# Patient Record
Sex: Female | Born: 1950 | Race: Black or African American | Hispanic: No | Marital: Single | Smoking: Light tobacco smoker
Health system: Southern US, Community
[De-identification: ages and names within clinical notes are randomized; demographics above are authoritative.]

## PROBLEM LIST (undated history)

## (undated) DIAGNOSIS — K219 Gastro-esophageal reflux disease without esophagitis: Secondary | ICD-10-CM

## (undated) DIAGNOSIS — E785 Hyperlipidemia, unspecified: Secondary | ICD-10-CM

## (undated) DIAGNOSIS — F32A Depression, unspecified: Secondary | ICD-10-CM

## (undated) DIAGNOSIS — F329 Major depressive disorder, single episode, unspecified: Secondary | ICD-10-CM

## (undated) DIAGNOSIS — N183 Chronic kidney disease, stage 3 unspecified: Secondary | ICD-10-CM

## (undated) DIAGNOSIS — E669 Obesity, unspecified: Secondary | ICD-10-CM

## (undated) DIAGNOSIS — R51 Headache: Secondary | ICD-10-CM

## (undated) DIAGNOSIS — I739 Peripheral vascular disease, unspecified: Secondary | ICD-10-CM

## (undated) DIAGNOSIS — R519 Headache, unspecified: Secondary | ICD-10-CM

## (undated) DIAGNOSIS — M1990 Unspecified osteoarthritis, unspecified site: Secondary | ICD-10-CM

## (undated) DIAGNOSIS — T7840XA Allergy, unspecified, initial encounter: Secondary | ICD-10-CM

## (undated) DIAGNOSIS — R0602 Shortness of breath: Secondary | ICD-10-CM

## (undated) DIAGNOSIS — G8929 Other chronic pain: Secondary | ICD-10-CM

## (undated) DIAGNOSIS — I639 Cerebral infarction, unspecified: Secondary | ICD-10-CM

## (undated) DIAGNOSIS — I10 Essential (primary) hypertension: Secondary | ICD-10-CM

## (undated) HISTORY — DX: Headache: R51

## (undated) HISTORY — DX: Allergy, unspecified, initial encounter: T78.40XA

## (undated) HISTORY — DX: Hyperlipidemia, unspecified: E78.5

## (undated) HISTORY — DX: Depression, unspecified: F32.A

## (undated) HISTORY — DX: Headache, unspecified: R51.9

## (undated) HISTORY — DX: Shortness of breath: R06.02

## (undated) HISTORY — DX: Other chronic pain: G89.29

## (undated) HISTORY — DX: Essential (primary) hypertension: I10

## (undated) HISTORY — DX: Peripheral vascular disease, unspecified: I73.9

## (undated) HISTORY — DX: Obesity, unspecified: E66.9

## (undated) HISTORY — DX: Unspecified osteoarthritis, unspecified site: M19.90

## (undated) HISTORY — DX: Major depressive disorder, single episode, unspecified: F32.9

---

## 2006-11-24 ENCOUNTER — Ambulatory Visit: Payer: Self-pay | Admitting: Nurse Practitioner

## 2006-11-27 ENCOUNTER — Ambulatory Visit: Payer: Self-pay | Admitting: *Deleted

## 2006-11-30 ENCOUNTER — Ambulatory Visit: Payer: Self-pay | Admitting: Nurse Practitioner

## 2007-07-20 DIAGNOSIS — M129 Arthropathy, unspecified: Secondary | ICD-10-CM | POA: Insufficient documentation

## 2007-07-20 DIAGNOSIS — I10 Essential (primary) hypertension: Secondary | ICD-10-CM | POA: Insufficient documentation

## 2007-07-20 DIAGNOSIS — E785 Hyperlipidemia, unspecified: Secondary | ICD-10-CM | POA: Insufficient documentation

## 2007-08-08 ENCOUNTER — Encounter (INDEPENDENT_AMBULATORY_CARE_PROVIDER_SITE_OTHER): Payer: Self-pay | Admitting: *Deleted

## 2007-12-03 ENCOUNTER — Other Ambulatory Visit: Admission: RE | Admit: 2007-12-03 | Discharge: 2007-12-03 | Payer: Self-pay | Admitting: Family Medicine

## 2009-03-11 ENCOUNTER — Other Ambulatory Visit: Admission: RE | Admit: 2009-03-11 | Discharge: 2009-03-11 | Payer: Self-pay | Admitting: Family Medicine

## 2010-10-11 ENCOUNTER — Encounter: Admission: RE | Admit: 2010-10-11 | Discharge: 2010-10-11 | Payer: Self-pay | Admitting: Family Medicine

## 2010-10-19 ENCOUNTER — Encounter: Admission: RE | Admit: 2010-10-19 | Discharge: 2010-10-19 | Payer: Self-pay | Admitting: Family Medicine

## 2010-11-05 ENCOUNTER — Ambulatory Visit: Payer: Self-pay | Admitting: Vascular Surgery

## 2010-11-25 ENCOUNTER — Encounter: Payer: Self-pay | Admitting: Cardiovascular Disease

## 2010-11-25 ENCOUNTER — Ambulatory Visit (HOSPITAL_COMMUNITY)
Admission: RE | Admit: 2010-11-25 | Discharge: 2010-11-25 | Payer: Self-pay | Source: Home / Self Care | Attending: Vascular Surgery | Admitting: Vascular Surgery

## 2010-11-25 LAB — POCT I-STAT, CHEM 8
BUN: 10 mg/dL (ref 6–23)
Calcium, Ion: 1.06 mmol/L — ABNORMAL LOW (ref 1.12–1.32)
Chloride: 104 mEq/L (ref 96–112)
Creatinine, Ser: 0.8 mg/dL (ref 0.4–1.2)
Glucose, Bld: 182 mg/dL — ABNORMAL HIGH (ref 70–99)
HCT: 41 % (ref 36.0–46.0)
Hemoglobin: 13.9 g/dL (ref 12.0–15.0)
Potassium: 3.4 mEq/L — ABNORMAL LOW (ref 3.5–5.1)
Sodium: 142 mEq/L (ref 135–145)
TCO2: 31 mmol/L (ref 0–100)

## 2010-11-25 LAB — GLUCOSE, CAPILLARY
Glucose-Capillary: 178 mg/dL — ABNORMAL HIGH (ref 70–99)
Glucose-Capillary: 169 mg/dL — ABNORMAL HIGH (ref 70–99)

## 2010-11-26 LAB — POCT I-STAT, CHEM 8
BUN: 16 mg/dL (ref 6–23)
Calcium, Ion: 0.98 mmol/L — ABNORMAL LOW (ref 1.12–1.32)
Chloride: 107 mEq/L (ref 96–112)
Creatinine, Ser: 0.9 mg/dL (ref 0.4–1.2)
Glucose, Bld: 180 mg/dL — ABNORMAL HIGH (ref 70–99)
HCT: 44 % (ref 36.0–46.0)
Hemoglobin: 15 g/dL (ref 12.0–15.0)
Potassium: 6.4 mEq/L (ref 3.5–5.1)
Sodium: 138 mEq/L (ref 135–145)
TCO2: 31 mmol/L (ref 0–100)

## 2010-12-10 ENCOUNTER — Ambulatory Visit: Admit: 2010-12-10 | Payer: Self-pay | Admitting: Vascular Surgery

## 2010-12-10 ENCOUNTER — Ambulatory Visit
Admission: RE | Admit: 2010-12-10 | Discharge: 2010-12-10 | Payer: Self-pay | Source: Home / Self Care | Attending: Vascular Surgery | Admitting: Vascular Surgery

## 2010-12-13 NOTE — Assessment & Plan Note (Addendum)
OFFICE VISIT  Alicia [name], Alicia [name] DOB:  Jun 30, 1951                                       12/10/2010 CHART#:[id number]  This is an established patient followup.  HISTORY OF PRESENT ILLNESS:  I recently took this patient back to the angio suite on 11/25/2010.  I did an aortogram, bilateral leg runoff, attempted left superficial femoral artery recanalization, however, this failed.  Currently the patient presents with left greater than right pain that she describes as consistent with claudication.  She says this influences even her ability to shop at the grocery store.  She has not followed up with her primary care physician and continues to smoke.  She denies any rest pain symptoms or any type of gangrene or ulceration in either feet.  PHYSICAL EXAMINATION:  She had a blood pressure 162/92, heart rate 103, respirations 12, satting 97%.  On physical exam today she has no evidence of any hematoma at the right common femoral artery cannulation site.  Both feet do not have any pulses and there are no findings consistent with gangrene or any type of ulcerations or ischemic changes.  MEDICAL DECISION MAKING:  This is a 60 year old female with multiple uncontrolled medical comorbidities.  She continues to smoke.  I discussed with her the importance of maximal medical management.  She needs to be started on aspirin, she needs to be started on a statin drug, she needs optimization of her blood pressure regimen as today's blood pressure demonstrates poorly controlled hypertension.  She needs to be started on some pharmacologic agents to help her with smoking cessation.  I discussed with her the importance of getting her medical conditions under good control before considering any type of operation and that she minimally is going to need a left common femoral artery to below knee popliteal bypass with vein of which she had a vein mapping today which demonstrates  good vein conduit on both sides in the greater saphenous and also the lesser saphenous.  She is also going to need preoperative risk stratification and optimization so she needs to be referred to cardiology also.  I insisted that she follow up with Dr. Berdine Addison for further optimization of her medical conditions and then evaluation by cardiology before proceeding with any type of bypass.  I will have my office will set up her followup appointments with her primary care doctor and also a cardiologist at Dr. Cathey Endow choice and then I will see her back in 1 month.  If she has at that point made a concerted effort to manage her medical problems and also stop smoking at that point I will offer her this left common femoral artery to below knee pop bypass but at this point she has demonstrated no effort whatsoever to optimize her medical additions and I feel that ultimately this mentality will result in failure as her distal peripheral arterial disease will progress and her tibials will become occluded and subsequently there is no reason to undergo a possibly highly mortal and highly morbid procedure when there is low chance of future long-term patency.  I cited to her an approximately 2% to 5% mortality rate with an approximately 20% to 30% morbidity rate in her case.  She is aware of these risks and she agrees to proceed forward with the plan as we discussed.  I spent approximately 30 minutes in discussing  the nature of the natural history of intermittent claudication and the nature of peripheral arterial disease.    Conrad [place], MD Electronically Signed  BLC/MEDQ  D:  12/10/2010  T:  12/13/2010  Job:  [id number]  cc:   Barrie Folk. Berdine Addison, MD

## 2010-12-17 NOTE — Procedures (Unsigned)
VASCULAR LAB EXAM  INDICATION:  Preoperative vein mapping per record a phone on 11/26/2010.  HISTORY: Diabetes: Cardiac: Hypertension:  EXAM:  Bilateral lower extremity vein mapping.  IMPRESSION: 1. The right greater saphenous vein is compressible with diameter     measurements ranging from 0.28 to 0.47 cm. 2. The left lesser saphenous vein is compressible with diameter     measurements ranging from 0.25 to 0.41 cm. 3. The left greater saphenous vein is compressible with diameter     measurements ranging from 0.32 to 0.66 cm. 4. The left lesser saphenous vein is compressible with diameter     measurements ranging from 0.22 to 0.33 cm.  See attached worksheet for additional measurements and diagram.  ___________________________________________ Conrad [place], MD  CH/MEDQ  D:  12/10/2010  T:  12/10/2010  Job:  [id number]

## 2011-01-03 ENCOUNTER — Encounter: Payer: Self-pay | Admitting: Cardiovascular Disease

## 2011-01-03 ENCOUNTER — Encounter (INDEPENDENT_AMBULATORY_CARE_PROVIDER_SITE_OTHER): Payer: Medicare PPO | Admitting: Cardiovascular Disease

## 2011-01-03 DIAGNOSIS — Z01810 Encounter for preprocedural cardiovascular examination: Secondary | ICD-10-CM

## 2011-01-03 DIAGNOSIS — I10 Essential (primary) hypertension: Secondary | ICD-10-CM

## 2011-01-03 DIAGNOSIS — R079 Chest pain, unspecified: Secondary | ICD-10-CM | POA: Insufficient documentation

## 2011-01-05 ENCOUNTER — Telehealth (INDEPENDENT_AMBULATORY_CARE_PROVIDER_SITE_OTHER): Payer: Self-pay | Admitting: Radiology

## 2011-01-06 ENCOUNTER — Encounter (INDEPENDENT_AMBULATORY_CARE_PROVIDER_SITE_OTHER): Payer: Self-pay | Admitting: *Deleted

## 2011-01-06 ENCOUNTER — Encounter: Payer: Self-pay | Admitting: *Deleted

## 2011-01-06 ENCOUNTER — Ambulatory Visit (HOSPITAL_COMMUNITY): Payer: Medicare PPO

## 2011-01-06 ENCOUNTER — Encounter: Payer: Self-pay | Admitting: Cardiology

## 2011-01-06 DIAGNOSIS — I4949 Other premature depolarization: Secondary | ICD-10-CM

## 2011-01-06 DIAGNOSIS — R079 Chest pain, unspecified: Secondary | ICD-10-CM

## 2011-01-06 DIAGNOSIS — R0602 Shortness of breath: Secondary | ICD-10-CM

## 2011-01-11 ENCOUNTER — Ambulatory Visit (HOSPITAL_COMMUNITY): Payer: Medicare PPO | Attending: Cardiovascular Disease

## 2011-01-11 DIAGNOSIS — E119 Type 2 diabetes mellitus without complications: Secondary | ICD-10-CM | POA: Insufficient documentation

## 2011-01-11 DIAGNOSIS — R0989 Other specified symptoms and signs involving the circulatory and respiratory systems: Secondary | ICD-10-CM

## 2011-01-11 DIAGNOSIS — R072 Precordial pain: Secondary | ICD-10-CM | POA: Insufficient documentation

## 2011-01-11 DIAGNOSIS — I10 Essential (primary) hypertension: Secondary | ICD-10-CM | POA: Insufficient documentation

## 2011-01-11 DIAGNOSIS — Z01810 Encounter for preprocedural cardiovascular examination: Secondary | ICD-10-CM

## 2011-01-11 DIAGNOSIS — R079 Chest pain, unspecified: Secondary | ICD-10-CM | POA: Insufficient documentation

## 2011-01-11 DIAGNOSIS — I379 Nonrheumatic pulmonary valve disorder, unspecified: Secondary | ICD-10-CM | POA: Insufficient documentation

## 2011-01-11 DIAGNOSIS — I079 Rheumatic tricuspid valve disease, unspecified: Secondary | ICD-10-CM | POA: Insufficient documentation

## 2011-01-11 DIAGNOSIS — E785 Hyperlipidemia, unspecified: Secondary | ICD-10-CM | POA: Insufficient documentation

## 2011-01-12 ENCOUNTER — Encounter: Payer: Self-pay | Admitting: Cardiovascular Disease

## 2011-01-12 NOTE — Assessment & Plan Note (Signed)
Summary: sur clearence/ per annette pt has [facility] choice...pt hav...  Medications Added NAPROXEN 500 MG TABS (NAPROXEN) 1 tab by mouth two times a day KLOR-CON 10 10 MEQ CR-TABS (POTASSIUM CHLORIDE) 1 tab by mouth two times a day METFORMIN HCL 1000 MG TABS (METFORMIN HCL) 1  tab by mouth two times a day FAMOTIDINE 20 MG TABS (FAMOTIDINE) 1 tab by mouth once daily VASOTEC 20 MG TABS (ENALAPRIL MALEATE) 1 tab by mouth once daily ADALAT CC 60 MG XR24H-TAB (NIFEDIPINE) 1 tab by mouth two times a day CRESTOR 5 MG TABS (ROSUVASTATIN CALCIUM) 1 tab by mouth once daily ASPIRIN 81 MG  TABS (ASPIRIN) 1  tab by mouth once daily        Visit Type:  Initial Consult Primary Provider:  Dr. Berdine Addison ([facility])  CC:  chest pain.  History of Present Illness: 60 yo AAF with history of obesity, DM, HTN, hyperlipidemia, ongoing tobacco abuse who is here today for cardiac evaluation prior to planned left lower extremity bypass surgery. She has been recently seen by Dr. Bridgett Larsson and had a lower extremity angiogram demonstrating bilateral occlusions of the SFA. Attempted PTA unsuccessful left SFA. Plans currently include femoral popliteal bypass of the left lower extremity. She is here today for pre-operative cardiac risk assessment.   She tells me that she has non-exertional chest pains several times per week. This is not a severe pain. No radiation into her neck/jaw or shoulder. Her breathing is always abnormal. No changes. Her legs ache with walking. The left leg hurts after walking for 20 feet. Bilateral knee pain secondary to arthritis. She smokes 1ppd.   Current Medications (verified): 1)  Naproxen 500 Mg Tabs (Naproxen) .Marland Kitchen.. 1 Tab By Mouth Two Times A Day 2)  Klor-Con 10 10 Meq Cr-Tabs (Potassium Chloride) .Marland Kitchen.. 1 Tab By Mouth Two Times A Day 3)  Metformin Hcl 1000 Mg Tabs (Metformin Hcl) .Marland Kitchen.. 1  Tab By Mouth Two Times A Day 4)  Famotidine 20 Mg Tabs (Famotidine) .Marland Kitchen.. 1 Tab By Mouth Once Daily 5)   Vasotec 20 Mg Tabs (Enalapril Maleate) .Marland Kitchen.. 1 Tab By Mouth Once Daily 6)  Adalat Cc 60 Mg Xr24h-Tab (Nifedipine) .Marland Kitchen.. 1 Tab By Mouth Two Times A Day 7)  Crestor 5 Mg Tabs (Rosuvastatin Calcium) .Marland Kitchen.. 1 Tab By Mouth Once Daily 8)  Aspirin 81 Mg  Tabs (Aspirin) .Marland Kitchen.. 1  Tab By Mouth Once Daily  Allergies: No Known Drug Allergies  Past History:  Past Medical History: 1.  DM 2.  HTN 3.  Hyperlipidemia 4. Arthritis 5. PAD 6. Tobacco abuse-x 40 years, 1ppd  Past Surgical History: None  Family History: Reviewed history from 12/31/2010 and no changes required.  Mother-deceased age 105, ? cause. Father-deceased, age 60, DM. 3 brothers-alive 6 sisters-alive  Social History: Reviewed history from 12/31/2010 and no changes required. 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  The patient denies fatigue, malaise, fever, weight gain/loss, vision loss, decreased hearing, hoarseness, palpitations, prolonged cough, wheezing, sleep apnea, coughing up blood, abdominal pain, blood in stool, nausea, vomiting, diarrhea, heartburn, incontinence, blood in urine, muscle weakness, joint pain, leg swelling, rash, skin lesions, headache, fainting, dizziness, depression, anxiety, enlarged lymph nodes, easy bruising or bleeding, and environmental allergies.         Bilateral leg pain with ambulation.  Vital Signs:  Patient profile:   60 year old female Height:      67 inches Weight:      275 pounds BMI:     43.23 Pulse rate:   75 / minute Resp:     14 per minute BP sitting:   160 / 100  (left arm)  Vitals Entered By: Burnett Kanaris (January 03, 2011 10:21 AM)   Physical Exam  General:  General: Well developed, well nourished, NAD HEENT: OP clear, mucus membranes moist SKIN: warm, dry Neuro: No focal  deficits Musculoskeletal: Muscle strength 5/5 all ext Psychiatric: Mood and affect normal Neck: No JVD, no carotid bruits, no thyromegaly, no lymphadenopathy. Lungs:Clear bilaterally, no wheezes, rhonci, crackles CV: RRR with systolic  murmur. No  gallops rubs Abdomen: soft, NT, ND, BS present Extremities: No edema, pulses non-palpable bilateral DP/PT.    EKG  Procedure date:  11/25/2010  Findings:      NSR, rate 86 bpm. LVH. PACs.   Impression & Recommendations:  Problem # 1:  PRE-OPERATIVE CARDIAC EXAM (ICD-V72.81) She has multiple risk factors for CAD including obesity, sedentary lifestyle, tobacco abuse, DM, HTN and hyperlipidemia. She has no prior history of CAD but she is having occasional chest pains. Will arrange Lexiscan myoview to exclude ischemia. Given murmur and LVH on ekg, will check echo. Will call her with results. If abnormal, will arrange follow up appt.   Her updated medication list for this problem includes:    Vasotec 20 Mg Tabs (Enalapril maleate) .Marland Kitchen... 1 tab by mouth once daily    Adalat Cc 60 Mg Xr24h-tab (Nifedipine) .Marland Kitchen... 1 tab by mouth two times a day    Aspirin 81 Mg Tabs (Aspirin) .Marland Kitchen... 1  tab by mouth once daily  Problem # 2:  CHEST PAIN (ICD-786.50) See above.   Her updated medication list for this problem includes:    Vasotec 20 Mg Tabs (Enalapril maleate) .Marland Kitchen... 1 tab by mouth once daily    Adalat Cc 60 Mg Xr24h-tab (Nifedipine) .Marland Kitchen... 1 tab by mouth two times a day    Aspirin 81 Mg Tabs (Aspirin) .Marland Kitchen... 1  tab by mouth once daily  Orders: Nuclear Stress Test (Nuc Stress Test) Echocardiogram (Echo)  Problem # 3:  HYPERTENSION (ICD-401.9) Poor control. Will need to be followed and have meds adjusted as necessary.   Her updated medication list for this problem includes:    Vasotec 20 Mg Tabs (Enalapril maleate) .Marland Kitchen... 1 tab by mouth once daily    Adalat Cc 60 Mg Xr24h-tab (Nifedipine) .Marland Kitchen... 1 tab by mouth two times a day    Aspirin 81 Mg Tabs  (Aspirin) .Marland Kitchen... 1  tab by mouth once daily  Patient Instructions: 1)  Your physician recommends that you schedule a follow-up appointment as needed. We will call you with the results. 2)  Your physician recommends that you continue on your current medications as directed. Please refer to the Current Medication list given to you today. 3)  Your physician has requested that you have an echocardiogram.  Echocardiography is a painless test that uses sound waves to create images of your heart. It provides your doctor with information about the size and shape of your heart and how well your heart's chambers and valves are working.  This procedure takes approximately one hour. There are no restrictions for this procedure. 4)  Your physician has requested that you have an Bettles.  For further information please visit [email or web address].  Please follow instruction sheet, as  given.

## 2011-01-12 NOTE — Progress Notes (Signed)
Summary: Nuclear Pre-Procedure  Phone Note Outgoing Call Call back at [facility] Phone [phone]   Call placed by: Eliezer Lofts, EMT-P,  January 05, 2011 11:28 AM Call placed to: Patient Action Taken: Phone Call Completed Reason for Call: Confirm/change Appt Summary of Call: Reviewed information on Myoview Information Sheet (see scanned document for further details).  Spoke with the patient. Eliezer Lofts, EMT-P  January 05, 2011 11:28 AM      Nuclear Med Background Indications for Stress Test: Evaluation for Ischemia, Surgical Clearance  Indications Comments: Pending LLE bypass   History Comments: AAF  Symptoms: Chest Pain, SOB    Nuclear Pre-Procedure Cardiac Risk Factors: Hypertension, Lipids, NIDDM, Obesity, PVD, Smoker Height (in): 67

## 2011-01-18 NOTE — Letter (Signed)
Summary: Generic Letter  Press photographer, [place]  [address] [place]   [place], [place] [address]   Phone: [phone]  Fax: [phone]        January 12, 2011 MRN: [id number]    [address] [place], [place]  [address]    Dr. Bridgett Larsson,   I had the pleasure of seeing Josephina Hattar in my office for cardiac evaluation prior to her planned surgical procedure. Her stress test showed no ischemia. Her echocardiogram demonstrated low normal LV systolic function with no significant valvular abnormalities. I would not recommend further cardiac workup at this time. I will fax the stress test report and the echocardiogram report to your office. Thank you for including me in her care.      Sincerely,  Lauree Chandler, MD  This letter has been electronically signed by your physician.  Appended Document: Generic Letter letter faxed to Dr Bridgett Larsson

## 2011-01-18 NOTE — Assessment & Plan Note (Signed)
Summary: Cardiology Nuclear Testing  Nuclear Med Background Indications for Stress Test: Evaluation for Ischemia, Surgical Clearance  Indications Comments: Pending LLE bypass   History Comments: AAF  Symptoms: Chest Pain, DOE, Fatigue, Palpitations, SOB    Nuclear Pre-Procedure Cardiac Risk Factors: Hypertension, Lipids, NIDDM, Obesity, PVD, Smoker Caffeine/Decaff Intake: none NPO After: 7:00 PM Lungs: clear IV 0.9% NS with Angio Cath: 20g     IV Site: R Antecubital IV Started by: Perrin Maltese, EMT-P Chest Size (in) 42     Cup Size DD     Height (in): 67 Weight (lb): 272 BMI: 42.76  Nuclear Med Study 1 or 2 day study:  2 day     Stress Test Type:  Lexiscan Reading MD:  Darlin Coco, MD     Referring MD:  C.McAlhany Resting Radionuclide:  Technetium 25m Tetrofosmin     Resting Radionuclide Dose:  33 mCi  Stress Radionuclide:  Technetium 60m Tetrofosmin     Stress Radionuclide Dose:  33 mCi   Stress Protocol  Max Systolic BP: 0000000 mm Hg Lexiscan: 0.4 mg   Stress Test Technologist:  Perrin Maltese, EMT-P     Nuclear Technologist:  Annye Rusk, CNMT  Rest Procedure  Myocardial perfusion imaging was performed at rest 45 minutes following the intravenous administration of Technetium 45m Tetrofosmin.  Stress Procedure  The patient received IV Lexiscan 0.4 mg over 15-seconds.  Technetium 75m Tetrofosmin injected at 30-seconds.  There were no significant changes, + sob, and occ pvcs with infusion.  Quantitative spect images were obtained after a 45 minute delay.  QPS Raw Data Images:  Normal; no motion artifact; normal heart/lung ratio. Stress Images:  Normal homogeneous uptake in all areas of the myocardium. Rest Images:  Normal homogeneous uptake in all areas of the myocardium. Subtraction (SDS):  No evidence of ischemia. Transient Ischemic Dilatation:  1.37  (Normal <1.22)  Lung/Heart Ratio:  0.27  (Normal <0.45)  Quantitative Gated Spect Images QGS EDV:  118  ml QGS ESV:  61 ml QGS EF:  48 % QGS cine images:  No segmental wall motion abnormalities.  Findings Low risk nuclear study   Evidence for LV Dysfunction LV Dysfunction    Overall Impression  Exercise Capacity: Lexiscan with no exercise. BP Response: Normal blood pressure response. Clinical Symptoms: Dyspnea.  No chest pain. ECG Impression: No significant ST segment change suggestive of ischemia. Overall Impression: Normal stress nuclear study. Overall Impression Comments: Borderline low LV EF.  No perfusion abnormalities seen.  Appended Document: Cardiology Nuclear Testing No evidence of ischemia. Can we let her know that the stress test is ok and she can have her surgery as planned? thanks, chris  Appended Document: Cardiology Nuclear Testing pt aware  resukts faxed to Dr Bridgett Larsson

## 2011-01-21 ENCOUNTER — Ambulatory Visit (INDEPENDENT_AMBULATORY_CARE_PROVIDER_SITE_OTHER): Payer: Medicare PPO | Admitting: Vascular Surgery

## 2011-01-21 DIAGNOSIS — I70219 Atherosclerosis of native arteries of extremities with intermittent claudication, unspecified extremity: Secondary | ICD-10-CM

## 2011-01-24 NOTE — Assessment & Plan Note (Signed)
OFFICE VISIT  Alicia [name], Alicia [name] DOB:  [birth year]                                       01/21/2011 CHART#:[id number]  This is an established patient.  HISTORY OF PRESENT ILLNESS:  This is a 60 year old patient with bilateral claudication that is lifestyle limiting and is influencing her ability to maintain a job.  Her left side is significantly more symptomatic than the right.  I had sent her back to Dr. Berdine Addison and a cardiologist for evaluation prior to proceeding forward with left common femoral artery to below knee popliteal bypass with vein from her left leg.  At this point the patient notes she has been decreasing her cigarette use and has been exercising with about a 1-2 pound weight loss monthly.  Additionally, she has been cleared by her cardiologist at this point.  Her symptomatology is unchanged from previous.  PHYSICAL EXAMINATION:  Her blood pressure 174/99, heart rate of 103, respirations 12, satting 97% on room air.  On focused exam bilateral feet had no palpable pulses.  There were no ulcers or any type of ischemic changes in either feet at this point.  MEDICAL DECISION MAKING:  This is a 60 year old female that presents with lifestyle limiting claudication.  We have had a long extended discussion about 30 minutes in regards to the risks, benefits and alternatives.  She is aware that she is at risk for possible death, bleeding, infection, possible wound complications, possible nerve damage, possible need for additional procedures.  I stressed with her previously about a 2% mortality rate and about a 30% morbidity rate especially with her obesity and need for vein harvest.  She is aware of the risks.  She is going to continue with optimizing her lifestyle, reducing her cigarette use and tentatively will schedule her per the patient's wishes on 02/23/2011.    Conrad [place], MD Electronically Signed  BLC/MEDQ  D:  01/21/2011  T:   01/24/2011  Job:  [id number]

## 2011-02-17 ENCOUNTER — Encounter (HOSPITAL_COMMUNITY)
Admission: RE | Admit: 2011-02-17 | Discharge: 2011-02-17 | Disposition: A | Discharge status: Home / Self Care | Payer: Medicare PPO | Source: Ambulatory Visit | Attending: Vascular Surgery | Admitting: Vascular Surgery

## 2011-02-17 ENCOUNTER — Other Ambulatory Visit: Payer: Self-pay | Admitting: Vascular Surgery

## 2011-02-17 ENCOUNTER — Ambulatory Visit (HOSPITAL_COMMUNITY)
Admission: RE | Admit: 2011-02-17 | Discharge: 2011-02-17 | Disposition: A | Discharge status: Home / Self Care | Payer: Medicare PPO | Source: Ambulatory Visit | Attending: Vascular Surgery | Admitting: Vascular Surgery

## 2011-02-17 DIAGNOSIS — E119 Type 2 diabetes mellitus without complications: Secondary | ICD-10-CM | POA: Insufficient documentation

## 2011-02-17 DIAGNOSIS — I739 Peripheral vascular disease, unspecified: Secondary | ICD-10-CM

## 2011-02-17 DIAGNOSIS — Z01812 Encounter for preprocedural laboratory examination: Secondary | ICD-10-CM | POA: Insufficient documentation

## 2011-02-17 DIAGNOSIS — R0602 Shortness of breath: Secondary | ICD-10-CM | POA: Insufficient documentation

## 2011-02-17 DIAGNOSIS — I10 Essential (primary) hypertension: Secondary | ICD-10-CM | POA: Insufficient documentation

## 2011-02-17 DIAGNOSIS — F17200 Nicotine dependence, unspecified, uncomplicated: Secondary | ICD-10-CM | POA: Insufficient documentation

## 2011-02-17 DIAGNOSIS — Z01818 Encounter for other preprocedural examination: Secondary | ICD-10-CM | POA: Insufficient documentation

## 2011-02-17 DIAGNOSIS — Z01811 Encounter for preprocedural respiratory examination: Secondary | ICD-10-CM | POA: Insufficient documentation

## 2011-02-17 LAB — CBC
HCT: 44.2 % (ref 36.0–46.0)
Hemoglobin: 14.8 g/dL (ref 12.0–15.0)
MCH: 29.8 pg (ref 26.0–34.0)
MCHC: 33.5 g/dL (ref 30.0–36.0)
MCV: 88.9 fL (ref 78.0–100.0)
Platelets: 282 10*3/uL (ref 150–400)
RBC: 4.97 MIL/uL (ref 3.87–5.11)
RDW: 14 % (ref 11.5–15.5)
WBC: 8.2 10*3/uL (ref 4.0–10.5)

## 2011-02-17 LAB — COMPREHENSIVE METABOLIC PANEL
ALT: 15 U/L (ref 0–35)
AST: 14 U/L (ref 0–37)
Albumin: 3.6 g/dL (ref 3.5–5.2)
Alkaline Phosphatase: 105 U/L (ref 39–117)
BUN: 10 mg/dL (ref 6–23)
CO2: 26 mEq/L (ref 19–32)
Calcium: 9.3 mg/dL (ref 8.4–10.5)
Chloride: 102 mEq/L (ref 96–112)
Creatinine, Ser: 0.78 mg/dL (ref 0.4–1.2)
GFR calc Af Amer: >60 mL/min (ref >60)
GFR calc non Af Amer: >60 mL/min (ref >60)
Glucose, Bld: 169 mg/dL — ABNORMAL HIGH (ref 70–99)
Potassium: 4.1 mEq/L (ref 3.5–5.1)
Sodium: 139 mEq/L (ref 135–145)
Total Bilirubin: 0.5 mg/dL (ref 0.3–1.2)
Total Protein: 7.2 g/dL (ref 6.0–8.3)

## 2011-02-17 LAB — ABO/RH: ABO/RH(D): O POS

## 2011-02-17 LAB — URINALYSIS, ROUTINE W REFLEX MICROSCOPIC
Bilirubin Urine: NEGATIVE
Glucose, UA: NEGATIVE mg/dL
Ketones, ur: NEGATIVE mg/dL
Nitrite: NEGATIVE
Protein, ur: NEGATIVE mg/dL
Specific Gravity, Urine: 1.015 (ref 1.005–1.030)
Urobilinogen, UA: 1 mg/dL (ref 0.0–1.0)
pH: 7.5 (ref 5.0–8.0)

## 2011-02-17 LAB — APTT: aPTT: 26 seconds (ref 24–37)

## 2011-02-17 LAB — URINE MICROSCOPIC-ADD ON

## 2011-02-17 LAB — PROTIME-INR
INR: 0.89 (ref 0.00–1.49)
Prothrombin Time: 12.2 seconds (ref 11.6–15.2)

## 2011-02-17 LAB — SURGICAL PCR SCREEN
MRSA, PCR: NEGATIVE
Staphylococcus aureus: NEGATIVE

## 2011-02-18 ENCOUNTER — Telehealth: Payer: Self-pay | Admitting: Cardiology

## 2011-02-18 NOTE — Telephone Encounter (Signed)
Stress faxed to [facility] @ [id number] 02/18/11/KM

## 2011-02-23 ENCOUNTER — Inpatient Hospital Stay (HOSPITAL_COMMUNITY)
Admission: RE | Admit: 2011-02-23 | Discharge: 2011-02-28 | DRG: 253 | Disposition: A | Discharge status: Home Health | Payer: Medicare PPO | Source: Ambulatory Visit | Attending: Vascular Surgery | Admitting: Vascular Surgery

## 2011-02-23 DIAGNOSIS — B952 Enterococcus as the cause of diseases classified elsewhere: Secondary | ICD-10-CM | POA: Diagnosis not present

## 2011-02-23 DIAGNOSIS — I70219 Atherosclerosis of native arteries of extremities with intermittent claudication, unspecified extremity: Principal | ICD-10-CM | POA: Diagnosis present

## 2011-02-23 DIAGNOSIS — N390 Urinary tract infection, site not specified: Secondary | ICD-10-CM | POA: Diagnosis not present

## 2011-02-23 DIAGNOSIS — E119 Type 2 diabetes mellitus without complications: Secondary | ICD-10-CM | POA: Diagnosis present

## 2011-02-23 DIAGNOSIS — Z7982 Long term (current) use of aspirin: Secondary | ICD-10-CM

## 2011-02-23 DIAGNOSIS — E785 Hyperlipidemia, unspecified: Secondary | ICD-10-CM | POA: Diagnosis present

## 2011-02-23 DIAGNOSIS — F17200 Nicotine dependence, unspecified, uncomplicated: Secondary | ICD-10-CM | POA: Diagnosis present

## 2011-02-23 DIAGNOSIS — E669 Obesity, unspecified: Secondary | ICD-10-CM | POA: Diagnosis present

## 2011-02-23 DIAGNOSIS — I7092 Chronic total occlusion of artery of the extremities: Secondary | ICD-10-CM | POA: Diagnosis present

## 2011-02-23 DIAGNOSIS — Z79899 Other long term (current) drug therapy: Secondary | ICD-10-CM

## 2011-02-23 DIAGNOSIS — I10 Essential (primary) hypertension: Secondary | ICD-10-CM | POA: Diagnosis present

## 2011-02-23 HISTORY — PX: FEMORAL BYPASS: SHX50

## 2011-02-23 LAB — GLUCOSE, CAPILLARY
Glucose-Capillary: 201 mg/dL — ABNORMAL HIGH (ref 70–99)
Glucose-Capillary: 148 mg/dL — ABNORMAL HIGH (ref 70–99)
Glucose-Capillary: 155 mg/dL — ABNORMAL HIGH (ref 70–99)
Glucose-Capillary: 162 mg/dL — ABNORMAL HIGH (ref 70–99)

## 2011-02-24 LAB — TYPE AND SCREEN
ABO/RH(D): O POS
Antibody Screen: NEGATIVE
Unit division: 0
Unit division: 0

## 2011-02-24 LAB — CBC
HCT: 33.8 % — ABNORMAL LOW (ref 36.0–46.0)
MCHC: 32 g/dL (ref 30.0–36.0)
MCV: 89.9 fL (ref 78.0–100.0)
Platelets: 211 10*3/uL (ref 150–400)
RDW: 14.2 % (ref 11.5–15.5)

## 2011-02-24 LAB — BASIC METABOLIC PANEL
BUN: 6 mg/dL (ref 6–23)
Calcium: 7.7 mg/dL — ABNORMAL LOW (ref 8.4–10.5)
GFR calc non Af Amer: >60 mL/min (ref >60)
Glucose, Bld: 173 mg/dL — ABNORMAL HIGH (ref 70–99)

## 2011-02-25 DIAGNOSIS — I739 Peripheral vascular disease, unspecified: Secondary | ICD-10-CM

## 2011-02-25 DIAGNOSIS — Z48812 Encounter for surgical aftercare following surgery on the circulatory system: Secondary | ICD-10-CM

## 2011-02-25 LAB — GLUCOSE, CAPILLARY
Glucose-Capillary: 160 mg/dL — ABNORMAL HIGH (ref 70–99)
Glucose-Capillary: 188 mg/dL — ABNORMAL HIGH (ref 70–99)
Glucose-Capillary: 195 mg/dL — ABNORMAL HIGH (ref 70–99)
Glucose-Capillary: 210 mg/dL — ABNORMAL HIGH (ref 70–99)

## 2011-02-25 LAB — HEMOGLOBIN A1C
Hgb A1c MFr Bld: 7.8 % — ABNORMAL HIGH (ref <5.7)
Mean Plasma Glucose: 177 mg/dL — ABNORMAL HIGH (ref <117)

## 2011-02-25 LAB — URINE MICROSCOPIC-ADD ON

## 2011-02-25 LAB — URINALYSIS, ROUTINE W REFLEX MICROSCOPIC
Bilirubin Urine: NEGATIVE
Hgb urine dipstick: NEGATIVE
Specific Gravity, Urine: 1.023 (ref 1.005–1.030)
Urobilinogen, UA: 1 mg/dL (ref 0.0–1.0)

## 2011-02-25 LAB — BASIC METABOLIC PANEL
BUN: 8 mg/dL (ref 6–23)
Calcium: 8.4 mg/dL (ref 8.4–10.5)
Creatinine, Ser: 0.79 mg/dL (ref 0.4–1.2)
GFR calc non Af Amer: >60 mL/min (ref >60)
Glucose, Bld: 169 mg/dL — ABNORMAL HIGH (ref 70–99)

## 2011-02-25 LAB — CBC
HCT: 35.3 % — ABNORMAL LOW (ref 36.0–46.0)
MCH: 29.5 pg (ref 26.0–34.0)
MCHC: 32.6 g/dL (ref 30.0–36.0)
MCV: 90.5 fL (ref 78.0–100.0)
RDW: 14.3 % (ref 11.5–15.5)

## 2011-02-25 NOTE — Op Note (Signed)
NAME:  JENIA, ANZALDUA NO.:  [id number]  MEDICAL RECORD NO.:  [id number]           PATIENT TYPE:  I  LOCATION:  3302                         FACILITY:  [facility]  PHYSICIAN:  Conrad Quesada, MD       DATE OF BIRTH:  07-May-1951  DATE OF PROCEDURE:  02/23/2011 DATE OF DISCHARGE:                              OPERATIVE REPORT   PROCEDURE:  Left common femoral artery to below-knee popliteal bypass with ipsilateral nonreverse greater saphenous vein.  PREOPERATIVE DIAGNOSIS:  Lifestyle limiting claudication, bilateral lower extremity, left greater than right.  POSTOPERATIVE DIAGNOSIS:  Lifestyle limiting claudication, bilateral lower extremity, left greater than right.  SURGEON:  Conrad Grottoes, MD  ASSISTANT:  Felipa Furnace, PA-C  ANESTHESIA:  General.  ESTIMATED BLOOD LOSS:  500 mL.  FLUIDS:  About 5 liters of crystalloid.  URINE OUTPUT:  About 300 mL.  FINDINGS: a palpable posterior tibial pulse, a dopplerable peroneal and  dorsalis pedis signals.  SPECIMENS: none.  INDICATIONS:  This is a 60 year old female with poorly controlled comorbidities including diabetes, hypertension, and hyperlipidemia, who I had seen in clinic, at that point she presented with intermittent claudication that was limiting her ability to work.  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  I then brought her back to clinic  extensive counseling in regards to the importance of her quitting her 2-pack a day smoking and control of her multiple comorbidities of which she had no control of uncontrolled hypertensive sugars elevated in the 3-400 and an known hyperlipidemia not being adequately controlled.  She was sent to her primary care physician to  optimize her underlying medical problems and also to cardiologist for risk stratification.  Based on his evaluation she was acceptable risk to proceed forward.  We discussed a plan to do a femoral to below-knee popliteal bypass with her own vein, however, I emphasized to her the importance of maximal medical management.  She agrees to such and to quit smoking.  I discussed specifically with her a possibly 2-5% mortality rate with this procedure given her multiple comorbidities and given her obesity I quoted her around the A999333 complication rate most likely wound infection.  She is aware such and has agreed to proceed forward with such.  DESCRIPTION OF THE OPERATION:  After full informed written consent was obtained from the patient, she was brought back to the operating room and placed supine upon the operating table.  Prior to induction, she had received IV antibiotics.  She was then prepped and draped in a standard fashion for a left leg bypass procedure.  I turned my attention first to her groin.  I made a longitudinal incision over the common femoral artery.  After dissecting through at least 3-4 cm of adipose tissue, I eventually got to her femoral sheath.  This was opened sharply and I was able to find the profunda artery and also  the external  iliac artery underneath the inguinal ligament.  I was able to get control  of the profunda artery, the superficial femoral artery and then the distal external iliac artery.  There was some palpable plaque within the artery, but I did not feel an endarterectomy was going to be necessary in this case.  I then turned my attention to her proximal calf.  I had previously marked out the location of the saphenous vein here and made an incision at this location and taking care to avoid injuring the saphenous vein, I was able to gain access to the popliteal fossa after dissecting through the subcutaneous tissue and fascia with electrocautery and blunt  dissection. There was a large popliteal vein anteriorly, this was dissected anteriorly to visualize the popliteal artery.  It was adjacent to a smaller popliteal vein posteriorly, I was able to get control of this artery proximally and distally with vessel loops.  At this point, then I turned my attention to the saphenous vein.  Dissecting from the saphenofemoral junction down, we made skip incisions in the leg and dissected out the entirety of this vein conduit.  We ligated the large side branches and then clipped the residual sides of these veins, in such fashion I was able to mobilize the vein from the saphenofemoral junction all the way down to the mid calf.  Using umbilical tape, I measured out the distance needed and we had an adequate vein conduit at this point.  I then clamped the saphenous vein distally and transected the vein and then tied off the saphenous vein distally with a 2-0 silk tie. I then pulled the vein all the way up to the saphenofemoral junction.  I had enough vein at this point to ligate the saphenous vein just distal to what appeared to be possibly a duplicated saphenous system.  This was clamped with a baby gregory clamp and then I transected the saphenous vein leaving a little cuff.  The cuff was oversewn with a 5-0 Prolene in a running fashion in a double layer.  I released the clamp and there was no more active bleeding from the saphenofemoral junction.  At this point, the vein was interrogated with by pressurizing it and we tied off any visible side branches and then repaired a few transected branches with a 7-0 Prolene.  At this point, the vein was felt to be adequate diameter throughout, and due to the geometry of it, I felt that a nonreversed orientation would be optimal in this patient, so we soaked a vein in the heparinized saline papaverine mixture and then turned our attention to placement of a metal tunneler.  I dissected from the popliteal fossa  up to the groin using a long Gore tunneler.  I took care to route this metal tunneler between the 2 condyles and then made an attempt to leave the metal tunnel on top of the route of the superficial femoral artery, so that this vein conduit was routed in an anatomically in-line flow.  At this point, the patient was given 10,000 units of heparin intravenously which was the appropriate therapeutic bolus for her weight at 120 kg. After waiting 3 minutes, then I clamped off the profunda, superficial femoral artery and the external iliac artery, and I made an arteriotomy with an 11-blade and extended it with Potts scissor.  There was a medial plaque that was present that did not occupy more than about a third of the lumen, so I did not feel  this required endarterectomy.  I tested the inflow, allowing the external iliac to bleed in antegrade fashion.  There was good pulsatile bleeding from the external iliac artery.  Also, I allowed the profunda to back bleed, there was good backbleeding, and also even the superficial femoral artery back bled.  At this point, I obtained the vein that has been soaking in the heparinized saline papaverine mix and I fashioned the proximal end of this vein in a hood for this anastomosis, I sewed the vein to the hood of the common femoral artery in an end-to-side configuration with a 5-0 Prolene.  I took special care to tack down the plaque circumferentially to prevent lifting of any dissection flap. This was completed without any difficulties.  I then released the clamps and vessel loops.  This pressurized the proximal segment of this vein conduit.   The valves at this point prevented any antegrade flow.  I obtained a Mills valvulotome  and then using this I lysed all of the valves that were obstructing flow.  This  was completed along the entire length of this vein conduit.  There was good pulsatile bleeding from the graft.  I marked the anterior surface of this  vein to maintain orientation.  At this point, I had completely lysed all of the obvious valves and there was good pulsatile bleeding out the end of this artery.  At this point, I took the bullet off the metal tunneler revealing the inner cannula.  The end of this vein was then tied to the inner cannula and then we delivered the vein conduit taking care to maintain orientation of this vein through the metal tunnel I repulled the metal tunnel down and then I transected a securing stitch to the vein conduit.  At this point, I released the clamp on the bypass graft and there was good pulsatile bleeding evident at the distal end of this graft.  I then reclamped it, instilled heparinized saline, flushed out the bypass conduits.  At this point, I turned my attention to the popliteal fossa, where the exposure was reset.  I had control of the popliteal artery proximally and distally with vessel loops.  I then clamped the artery proximally and distally, I removed the vessel loops and then made an arteriotomy with an 11-blade, and extended it with Potts scissor.  It was evident to me that in fact there was a substantial amount of atherosclerosis at this distal artery, but there was still a greater than 50% lumen in the artery.  This arteriotomy was about a 5-mm arteriotomy.  The vein at this level was also approximately 4 mm.  I spatulated this vein slightly to meet the dimensions of this arteriotomy and I had to cut the vein to appropriate length after straightening out the leg.  This vein was anastomosed to this popliteal artery with a running stitch of 6-0 Prolene.  Prior to completing this anastomosis, I allowed the bypass conduit to pressurize in antegrade fashion.  There was good antegrade bleeding and also allowed the popliteal artery to bleed both in antegrade and retrograde fashion.  There was actually good backbleeding from both directions.  I completed anastomosis in the usual fashion and  then I released all clamps.  I placed some thrombin and Gelfoam in all wounds and then after releasing the clamp on the bypass graft immediately there was a pulse in the distal popliteal artery.  We tested the foot, there was a dopplerable posterior tibial, anterior tibial and  dorsalis pedis signals that were much stronger than prior to the bypass.  Additionally, by the end of the case there was a palpable posterior pulse.  At this point, we irrigated out all wounds, looking for bleeding points in the groin.  A single repair stitch was necessary at the toe of the arterial anastomosis, otherwise, there was no more active bleeding at this location.  We looked over all the vein harvest incisions.  There were some areas that required electrocautery.  I gave her 15 mg of protamine initially to start the reversal of heparin and eventually I gave an additional 15 for total of 30 mg of protamine to reverse the heparinization.  By the end of the case, there were no more active bleeding from any wounds, but there was gentle ooze, this was eventually controlled by closing the potential spaces and gentle pressure.  The popliteal incision was closed with a deep layer of 2-0 Vicryl to reapproximate the deep subcutaneous tissue and then a running stitch of 3-0 Vicryl was used to close the more superficial subcutaneous tissue.  The harvest sites were also repaired with a deep layer of 2-0 Vicryl and then a layer of 3-0 Vicryl to further eliminate the dead space in this patient.  Please note to her obesity, there was significant concerns in regards to her ability to heal this along with her poorly controlled diabetes, I elected subsequently to staple all of the leg incisions, in case she develops a wound infection this will facilitate drainage of any infection.  In regards to the groin, we irrigated it out.  There was no more active bleeding.  The deep tissue was reapproximated with double layer of 2-0  Vicryl, then a double layer of 3-0 Vicryl was sewn in the superficial subcutaneous layer, then the skin was reapproximated with running subcuticular 4-0 Monocryl.  The skin was then cleaned, dried and reinforced with Dermabond.  All incisions were then covered with sterile bandages and then we also on the vein harvest sites also placed some 4x4s to help with any drainage from these wounds.  Please note again by the end of the case there was a palpable posterior tibial pulse.  Complications were none.  Condition was stable.     Conrad [place], MD     BLC/MEDQ  D:  02/23/2011  T:  02/24/2011  Job:  [id number]  Electronically Signed by Adele Barthel MD on 02/25/2011 06:01:49 PM

## 2011-02-26 LAB — GLUCOSE, CAPILLARY: Glucose-Capillary: 182 mg/dL — ABNORMAL HIGH (ref 70–99)

## 2011-02-26 LAB — CBC
MCH: 28.6 pg (ref 26.0–34.0)
MCHC: 32.1 g/dL (ref 30.0–36.0)
Platelets: 229 10*3/uL (ref 150–400)
RBC: 3.78 MIL/uL — ABNORMAL LOW (ref 3.87–5.11)

## 2011-02-27 DIAGNOSIS — M79609 Pain in unspecified limb: Secondary | ICD-10-CM

## 2011-02-27 LAB — GLUCOSE, CAPILLARY

## 2011-02-27 LAB — CBC
HCT: 32.6 % — ABNORMAL LOW (ref 36.0–46.0)
Hemoglobin: 10.7 g/dL — ABNORMAL LOW (ref 12.0–15.0)
MCH: 29.3 pg (ref 26.0–34.0)
MCV: 89.3 fL (ref 78.0–100.0)
RBC: 3.65 MIL/uL — ABNORMAL LOW (ref 3.87–5.11)

## 2011-02-28 LAB — URINE CULTURE: Colony Count: 25000

## 2011-02-28 LAB — GLUCOSE, CAPILLARY

## 2011-03-04 NOTE — Discharge Summary (Addendum)
NAME:  TEGHAN, BEIERMANN NO.:  [id number]  MEDICAL RECORD NO.:  [id number]           PATIENT TYPE:  I  LOCATION:  2032                         FACILITY:  [facility]  PHYSICIAN:  Conrad [place], MD       DATE OF BIRTH:  [birth year]  DATE OF ADMISSION:  02/23/2011 DATE OF DISCHARGE:  02/28/2011                              DISCHARGE SUMMARY   CHIEF COMPLAINT:  Bilateral lifestyle altering intermittent claudication, left greater than right.  HISTORY OF PRESENT ILLNESS:  Ms. Ouida Sills is a 60 year old with bilateral claudication, has lifestyle limiting influencing her daily ability to maintain her job, left-sided significantly more symptomatic than the right.  She had preop cardiac evaluation, was cleared for a left common femoral artery to below-knee popliteal artery bypass with vein from the ipsilateral leg.  She has been decreasing her cigarette use and exercising to lose 1-2 pounds per month.  She was admitted for the above procedure.  PAST MEDICAL HISTORY: 1. Hypertension. 2. Peripheral vascular disease. 3. Hyperlipidemia. 4. Smoker.  HOSPITAL COURSE:  The patient was taken to the operating room on February 23, 2011, for a left common femoral artery below knee popliteal bypass with ipsilateral nonreversed greater saphenous vein.  She did well postoperatively.  She had some fever.  UA was sent and she grow 25,000 colonies of enterococcus.  Her white count was normal.  Her H and H was stable.  She had good Doppler flow to the popliteal and posterior tibial artery.  She was up and walking.  She had some minimal serous drainage from upper thigh harvest wounds and these were painted with Betadine twice daily.  She was dressings as needed.  She will be discharged to home with Home Health and follow up in the office in approximately 2 weeks.  DISCHARGE MEDICATIONS: 1. Cipro 500 mg twice daily. 2. Oxycodone 5 mg 1-2 tablets every 6 hours as needed.  Prescriptions     were  given for both. 3. Nifedipine 60 mg twice daily. 4. Aspirin 81 mg daily. 5. BenGay as needed for arthritis pain. 6. Calcium carbonate and vitamin D 1 daily. 7. Crestor 5 mg daily. 8. Famotidine 20 mg as needed. 9. Klor-Con 10 mEq twice daily as needed for leg cramps. 10.Lotrisone for fungus topically as needed. 11.Metamucil as needed. 12.Metformin 1000 mg twice daily. 13.Naprosyn 500 mg twice daily as needed. 14.Omega-3 occasionally. 15.Tylenol Arthritis 1 daily as needed. 16.Vasotec 20 mg twice daily.  FINAL DIAGNOSES: 1. Bilateral claudication left greater than right status post left fem-     pop bypass. 2. Diabetes. 3. Hypertension. 4. Hypercholesterolemia.  DISPOSITION:  The patient was discharged to home.  She will follow up in the office in 2 weeks.  She will have home health.  HOSPITAL STUDIES:  ABIs done on April 6 showed 0.72 on the right and 0.88 on the left indicating mild reduction of arterial flow bilaterally.     Wray Kearns, PA-C   ______________________________ Conrad [place], MD    RR/MEDQ  D:  02/28/2011  T:  03/01/2011  Job:  [id number]  Electronically Signed by Wray Kearns PA on 03/04/2011 01:29:36 PM  Electronically Signed by Adele Barthel MD on 03/07/2011 09:07:12 AM

## 2011-03-11 ENCOUNTER — Ambulatory Visit (INDEPENDENT_AMBULATORY_CARE_PROVIDER_SITE_OTHER): Payer: Medicare PPO | Admitting: Vascular Surgery

## 2011-03-11 DIAGNOSIS — I70219 Atherosclerosis of native arteries of extremities with intermittent claudication, unspecified extremity: Secondary | ICD-10-CM

## 2011-03-14 NOTE — Assessment & Plan Note (Signed)
OFFICE VISIT  Alicia [name], Alicia [name] DOB:  [birth year]                                       03/11/2011 CHART#:[id number]  Post-op Visit  HISTORY OF PRESENT ILLNESS:  This is a 60 year old patient with lifestyle-altering claudication who presented on April 4 for a left common femoral artery to below knee popliteal bypass with  ipsilateral nonreversed greater saphenous vein.  At this point the patient notes continued swelling in the left leg which is starting to go down.  She has decreased pain in this left leg with ambulation.  Pain is well- controlled without narcotics at this point.  No fevers or chills or any drainage from any incision.  PHYSICAL EXAMINATION:  VITAL SIGNS:  Blood pressure 146/94, heart rate of 91, respirations 22.  Focused exam of the left leg:  The left groin incision is well-healed.  No maceration at this incision.  The staple line is intact.  All staples were removed today.  The left foot is nice and warm with a palpable pulse in the posterior tibial and now even a palpable dorsalis pedis pulse.  There is significant swelling in this leg compared to the right.  Per the patient, this is decreased from previous.  MEDICAL DECISION MAKING:  This is a 60 year old patient now who is 16 days postop from a left fem-pop bypass with her own vein.  I rechecked the studies from the hospital.  She had a postoperative ABI of 0.88 on this left side, improved from her previous ABI of 0.55 on this side. Her venous duplex failed to demonstrate any DVT in this limb, so likely the source of her swelling is appropriate postoperative hyperemia related to the increased blood flow in this leg.  At this point I am going to let the patient recover from her surgery and in 6 months repeat the ABI studies.  At that point, we will see if she is ready to proceed forward with revascularization on her right limb.  I emphasized to her the importance of her lifestyle  changes and close medical management of her multiple comorbidities.  She is aware of the residual disease she still has and the need to medically manage her atherosclerotic burden.  I also emphasized once again the importance of continued good hygiene and keeping the left groin as dry as possible, as she is at risk for macerating that wound which is in the process of successfully healing.    Conrad Boyds, MD Electronically Signed  BLC/MEDQ  D:  03/11/2011  T:  03/14/2011  Job:  [id number]  cc:   Barrie Folk. Berdine Addison, MD

## 2011-04-05 NOTE — Assessment & Plan Note (Signed)
OFFICE VISIT   Alicia [name], Alicia [name]  DOB:  09-Feb-1951                                       11/05/2010  CHART#:[id number]   REASON FOR CONSULTATION:  Bilateral leg PAD / left leg claudication.   HISTORY OF PRESENT ILLNESS:  This is a 60 year old female who presents  with a 1 year history of left leg claudication.  She notes over the last  few months worsening of her symptomatology now that is affecting her  ability to work and to complete her activities of daily living.  She has  never had any gangrene or ulcers in either feet.  The way she describes  her pain is pain in her left calf which is severe enough to keep her  from ambulating more than 10 or so yards without having to rest.  This  pain resolves with resting.  Her atherosclerotic risk factors include  hypertension, diabetes, tobacco dependence and obesity.  She notes no  prior history of any type of rest pain symptomatology and no fevers or  chills.   PAST MEDICAL HISTORY:  1. Peripheral arterial disease.  2. Hypertension.  3. Diabetes.  4. Tobacco dependence.  5. Obesity.  6. Hyperlipidemia.   PAST SURGICAL HISTORY:  None.   SOCIAL HISTORY:  She smokes one pack a day of cigarettes for 35 years.  She drinks in a social capacity but not to excess.  No illicit drug use.   FAMILY HISTORY:  Father had diabetes.  Her mother died of coronary  artery disease complications.   MEDICATIONS:  Included naproxen, metformin, potassium, Pepcid, Vasotec,  Adalat.   ALLERGIES:  She has no known drug allergies listed.   REVIEW OF SYSTEMS:  Today she noted weight gain, headache, change in  eyesight, change in hearing, pain in legs with walking, arthritis joint  pain, muscle pain, shortness of breath lying flat, constipation,  depression, frequent urination and rashes.  Otherwise the rest of review  of systems was listed as negative.   PHYSICAL EXAMINATION:  Vital signs:  She had a temperature of 98.3,  blood pressure 154/88, pulse 98, respirations 12.  General:  She is morbidly obese, well developed, well-nourished, no  apparent distress, alert and oriented x3.  Head:  Normocephalic, atraumatic.  ENT:  Oropharynx without any erythema or exudate.  Nares with erythema  without any drainage.  Hearing was grossly intact.  Neck:  Supple neck.  No nuchal rigidity.  No lymphadenopathy was noted.  Eyes:  Pupils were equal, round, reactive to light.  Extraocular  movements were intact.  Pulmonary: Symmetric breath sounds, no rales, rhonchi, or wheezing  Cardiac:  Regular rate and rhythm.  Normal S1-S2.  No murmurs, rubs,  thrills or gallops.  Vascular:  She had easily palpable pulses throughout the upper  extremities.  The carotids were easily palpable without any bruits.  I  had some difficulty palpating her femorals due to her pannus being in  the way.  I could not appreciate any popliteal or pedal pulses.  A  bdomen:  She was morbidly obese, soft abdomen, nondistended.  No  guarding.  No rebound.  No obvious masses.  I could not appreciate her  aorta due to the large size of her pannus.  Musculoskeletal:  She had 5/5 strength in all extremities and no signs  of cyanosis or gangrene in any  extremity.  Neuro:  Motor strength was as listed above.  Cranial nerves II-XII were  intact.  Sensation was grossly intact in all extremities.  Psychiatric:  Judgment was intact, her affect and mood were appropriate  for her clinical situation.  Skin:  Her extremities demonstrated no findings of ulcers or gangrene  and there are no obvious rashes anywhere.  Lymphatic:  She had no cervical, axillary or inguinal lymphadenopathy.   I reviewed about 10 pages of outside documentation which included an ABI  which demonstrated a left ABI 0.55 and right of 0.59.  It was also read  as consistent with possible iliac inflow disease.  The patient's  creatinine was listed as 0.77.   MEDICAL DECISION MAKING:  This  is a 60 year old female with lifestyle  limiting claudication.  I discussed with her that the initial management  for claudication was medical optimization.  She is currently on  metformin.  She needs to be on very strict control in terms of her  diabetes with target A1c in the 6.5 and below range.  Additionally her  high blood pressure needs to be extremely well-controlled.  Currently it  is 154/88 so additional titration is necessary.  She has hyperlipidemia  with an LDL of 147.  In this patient population she needs to have an LDL  below 70 so she needs to be started on a statin drug.  She needs to  start on aspirin.  I already discussed with her the need for weight  loss.  Additionally she needs to quit smoking.  We discussed that in  diabetics who smoke their rate of loss of limb is twice the rate of  nonsmokers.  Additionally diabetics tend to have their amputations a  full decade before other patients.  So she needs to work on the smoking  cessation part of this too.  I reiterated to her that without proper  medical management that any intervention is doomed to fail, however, at  this point her symptomatology is significant it is influencing her  activities of daily living and her ability to maintain a job so I told  her at this point there were indications for a diagnostic angiogram and  possible intervention.  Tentatively she is going to be scheduled for  11/25/2010.  At that point will do an aortogram, bilateral leg runoff  and possible leg intervention.  I have recommended she follow up with  her primary care physician, Dr. Berdine Addison, for further titration of her  medical problems and that it was absolutely essential that she quit  smoking.  She reiterated to me that she understood and she would make  attempt to quit smoking initially by herself and then possibly if she  needs help with pharmacologic or counseling she would follow up with [place]  also for that.   Thank you for giving [place]  the opportunity to participate in this patient's  care.     Conrad Lockhart, MD  Electronically Signed   BLC/MEDQ  D:  11/05/2010  T:  11/08/2010  Job:  [id number]   cc:   Barrie Folk. Berdine Addison, MD

## 2011-05-23 ENCOUNTER — Encounter: Payer: Self-pay | Admitting: Vascular Surgery

## 2011-06-08 NOTE — Progress Notes (Signed)
[facility] [place]  Established Previous Bypass  History of Present Illness  Alicia [name] is a 60 y.o. female who presents with chief complaint: scheduled post-operative surveillance.  Previous operation(s) include: L CFA to BK pop bypass with ips non-reversed GSV (Date: 02/23/11).  The patient's claudications symptoms have resolved.  The patient's symptoms are: neuropathic pain in L leg and continue L leg swelling, greatly improved. The patient's treatment regimen currently included: maximal medical management.  Past Medical History, Past Surgical History, Social History, Family History, Medications, Allergies, and Review of Systems are unchanged from previous evaluation on 03/11/11.  Physical Examination  Filed Vitals:   06/10/11 1110  BP: 172/99  Pulse: 89    General: A&O x 3, WDWN, obese  Pulmonary: Sym exp, good air movt, CTAB, no rales, rhonchi, & wheezing  Cardiac: RRR, Nl S1, S2, no Murmurs, rubs or gallops  Vascular: Vessel Right Left  Radial Palpable Palpable  Ulnary Palpable Palpable  Brachial Palpable Palpable  Carotid Palpable, without bruit Palpable, without bruit  Aorta Non-palpable N/A  Femoral Palpable Palpable  Popliteal Non-palpable Non-palpable  PT Non-Palpable Palpable  DP Non-Palpable Palpable   Musculoskeletal: M/S 5/5 throughout, Extremities without ischemic changes, L leg incision healed with small keloids  Neurologic: Pain and light touch intact in extremities, Motor exam as listed above  Non-Invasive Vascular Imaging ABI (Date: 06/10/11)  RLE: 0.63  LLE: 1.10   L bypass graft duplex (Date: 06/10/11)  L CFA: 179 c/s  BPG: prox 128 c/s, mid 85-101 c/s, distal 135-153 c/s  L distal pop: 161 c/s  Medical Decision Making  Alicia [name] is a 60 y.o. female who presents with: s/p L CFA to BK pop bypass with ips non-reversed GSV .  The patient's severe claudication are resolved but she has some residual  swelling which should improve with time and some neuropathic pain in the left leg.  The neuropathic pain is either due to her diabetes or maybe residual ischemic neuropathy from her period of hypoperfusion +/- reperfusion injury.  Regardless, a trial of Neurotin should be considered.  The patient wanted to let Dr. Berdine Addison make the final decision in that regards as she is seeing him next week.  I discussed in depth with the patient the nature of atherosclerosis, and emphasized the importance of maximal medical management including strict control of blood pressure, blood glucose, and lipid levels, obtaining regular exercise, and cessation of smoking.  The patient is aware that without maximal medical management the underlying atherosclerotic disease process will progress, limiting the benefit of any interventions.  I discussed in depth with the patient the need for long term surveillance to improve the primary assisted patency of his bypass.  The patient agrees to cooperate with such.  In regards to the velocities on the graft duplex, they reflect the underlying PAD in in this patient's left leg.  I emphasized the importance of management of her underlying medical conditions as progression of her underlying disease will reverse any gains from her surgical bypass.  The patient is scheduled for the follow surveillance studies: ABI and bypass duplex in 6 months.  Thank you for allowing [place] to participate in this patient's care.  Adele Barthel, MD [facility] [place] Office: [phone] Pager: [phone]

## 2011-06-10 ENCOUNTER — Encounter (INDEPENDENT_AMBULATORY_CARE_PROVIDER_SITE_OTHER): Payer: Medicare PPO

## 2011-06-10 ENCOUNTER — Encounter: Payer: Self-pay | Admitting: Vascular Surgery

## 2011-06-10 ENCOUNTER — Ambulatory Visit (INDEPENDENT_AMBULATORY_CARE_PROVIDER_SITE_OTHER): Payer: Medicare PPO | Admitting: Vascular Surgery

## 2011-06-10 VITALS — BP 172/99 | HR 89 | Ht 67.0 in | Wt 270.0 lb

## 2011-06-10 DIAGNOSIS — I739 Peripheral vascular disease, unspecified: Secondary | ICD-10-CM

## 2011-06-10 DIAGNOSIS — Z48812 Encounter for surgical aftercare following surgery on the circulatory system: Secondary | ICD-10-CM

## 2011-06-10 DIAGNOSIS — I7092 Chronic total occlusion of artery of the extremities: Secondary | ICD-10-CM

## 2011-06-10 NOTE — Progress Notes (Signed)
3 month f/u after vasc. Lab,

## 2011-06-15 NOTE — Procedures (Unsigned)
BYPASS GRAFT EVALUATION  INDICATION:  Followup peripheral vascular disease.  HISTORY: Diabetes:  Yes. Cardiac: Hypertension:  Yes. Smoking:  Currently. Previous Surgery:  Left common femoral artery to popliteal (BK) bypass graft on 02/23/2011.  SINGLE LEVEL ARTERIAL EXAM                              RIGHT              LEFT Brachial:                    182                177 Anterior tibial:             115                200 Posterior tibial:            103                201 Peroneal: Ankle/brachial index:        0.63               1.10  PREVIOUS ABI:  Date:  02/25/2011  RIGHT:  0.72  LEFT:  0.88  LOWER EXTREMITY BYPASS GRAFT DUPLEX EXAM:  DUPLEX:  Elevated velocities present with mild heterogeneous plaque at the left common femoral artery, left distal femoral to popliteal bypass graft anastomosis, left tibial peroneal trunk suggestive of 30%-49% stenosis.  IMPRESSION: 1. Patent left common femoral artery to popliteal artery bypass graft. 2. Stenosis as noted above. 3. Right ankle brachial index is in the moderate claudication range     and this is a decrease since 02/25/2011. 4. Left ankle brachial index is normal and improved since 02/25/2011.  ___________________________________________ Conrad June Park, MD  SH/MEDQ  D:  06/10/2011  T:  06/10/2011  Job:  [id number]

## 2011-12-09 ENCOUNTER — Ambulatory Visit: Payer: Medicare PPO | Admitting: Vascular Surgery

## 2012-01-05 ENCOUNTER — Encounter: Payer: Self-pay | Admitting: Vascular Surgery

## 2012-01-06 ENCOUNTER — Encounter (INDEPENDENT_AMBULATORY_CARE_PROVIDER_SITE_OTHER): Payer: Medicare PPO | Admitting: *Deleted

## 2012-01-06 ENCOUNTER — Ambulatory Visit: Payer: Medicare PPO | Admitting: Vascular Surgery

## 2012-01-06 DIAGNOSIS — Z48812 Encounter for surgical aftercare following surgery on the circulatory system: Secondary | ICD-10-CM

## 2012-01-06 DIAGNOSIS — I739 Peripheral vascular disease, unspecified: Secondary | ICD-10-CM

## 2012-01-17 ENCOUNTER — Other Ambulatory Visit: Payer: Self-pay | Admitting: *Deleted

## 2012-01-17 DIAGNOSIS — I739 Peripheral vascular disease, unspecified: Secondary | ICD-10-CM

## 2012-01-17 DIAGNOSIS — Z48812 Encounter for surgical aftercare following surgery on the circulatory system: Secondary | ICD-10-CM

## 2012-01-17 NOTE — Procedures (Unsigned)
BYPASS GRAFT EVALUATION  INDICATION:  Follow up left lower extremity bypass graft.  HISTORY: Diabetes:  Yes. Cardiac: Hypertension:  Yes. Smoking:  Current. Previous Surgery:  Left common femoral artery-to-below-knee popliteal bypass graft performed 02/23/2011.  SINGLE LEVEL ARTERIAL EXAM                              RIGHT              LEFT Brachial:                    180                175 Anterior tibial:             123                217 Posterior tibial:            125                201 Peroneal: Ankle/brachial index:        0.69               1.21  PREVIOUS ABI:  Date: 06/10/2011  RIGHT:  0.63  LEFT:  1.10  LOWER EXTREMITY BYPASS GRAFT DUPLEX EXAM:  DUPLEX: 1. Patent left common femoral-to-below-knee popliteal bypass graft     with triphasic waveforms noted throughout. 2. The distal anastomosis could not be visualized due to a large     overlying hematoma measuring approximately 4.41 x 5.46 cm. 3. Of note, there is an upper thigh medial hematoma measuring 4.03 x     4.29 cm. 4. These findings were not noted on the previous exam, although the     patient says that the knots have been there since the surgery.  IMPRESSION: 1. Patent left common femoral artery-to-below-knee popliteal bypass     graft with triphasic waveforms noted throughout. 2. The distal anastomosis could not be visualized due to the proximal     medial calf hematoma. 3. Dr. Bridgett Larsson was made aware of the new findings of the upper thigh and     proximal calf hematomas.  ___________________________________________ Conrad Niotaze, MD  EM/MEDQ  D:  01/09/2012  T:  01/09/2012  Job:  [id number]

## 2012-01-18 ENCOUNTER — Encounter: Payer: Self-pay | Admitting: Vascular Surgery

## 2012-04-05 ENCOUNTER — Encounter: Payer: Self-pay | Admitting: Vascular Surgery

## 2012-04-06 ENCOUNTER — Encounter (INDEPENDENT_AMBULATORY_CARE_PROVIDER_SITE_OTHER): Payer: Medicare PPO | Admitting: *Deleted

## 2012-04-06 ENCOUNTER — Encounter: Payer: Self-pay | Admitting: Vascular Surgery

## 2012-04-06 ENCOUNTER — Ambulatory Visit (INDEPENDENT_AMBULATORY_CARE_PROVIDER_SITE_OTHER): Payer: Medicare PPO | Admitting: Vascular Surgery

## 2012-04-06 VITALS — BP 155/92 | HR 98 | Temp 98.2°F | Ht 67.0 in | Wt 276.0 lb

## 2012-04-06 DIAGNOSIS — Z48812 Encounter for surgical aftercare following surgery on the circulatory system: Secondary | ICD-10-CM

## 2012-04-06 DIAGNOSIS — I739 Peripheral vascular disease, unspecified: Secondary | ICD-10-CM

## 2012-04-06 DIAGNOSIS — I70219 Atherosclerosis of native arteries of extremities with intermittent claudication, unspecified extremity: Secondary | ICD-10-CM | POA: Insufficient documentation

## 2012-04-06 NOTE — Progress Notes (Signed)
[facility] [place]   Established Previous Bypass   History of Present Illness  Alicia [name] is a 61 y.o. female who presents with chief complaint: post-operative surveillance. Previous operation include: L CFA to BK pop bypass with ips non-reversed GSV (Date: 02/23/11). The patient's claudications symptoms have resolved. The patient's symptoms are: neuropathic pain in L leg (altered anterior leg skin sensation) and continue L leg swelling, greatly improved. The patient's treatment regimen currently included: maximal medical management.   Past Medical History, Past Surgical History, Social History, Family History, Medications, Allergies, and Review of Systems are unchanged from previous evaluation on 06/10/11.   Physical Examination  Filed Vitals:   04/06/12 1540  BP: 155/92  Pulse: 98  Temp: 98.2 F (36.8 C)  TempSrc: Oral  Height: 5\' 7"  (1.702 m)  Weight: 276 lb (125.193 kg)   General: A&O x 3, WDWN, obese   Pulmonary: Sym exp, good air movt, CTAB, no rales, rhonchi, & wheezing   Cardiac: RRR, Nl S1, S2, no Murmurs, rubs or gallops   Vascular:  Vessel  Right  Left   Radial  Palpable  Palpable   Ulnary  Palpable  Palpable   Brachial  Palpable  Palpable   Carotid  Palpable, without bruit  Palpable, without bruit   Aorta  Non-palpable  N/A   Femoral  Palpable  Palpable   Popliteal  Non-palpable  Non-palpable   PT  Non-Palpable  Palpable   DP  Non-Palpable  Palpable    Musculoskeletal: M/S 5/5 throughout, Extremities without ischemic changes, L leg incision healed with small keloids, seroma in medial thigh and over calf incision , some residual swelling but improved  Neurologic: Pain and light touch intact in extremities, Motor exam as listed above   Non-Invasive Vascular Imaging  ABI (Date:04/06/12)  RLE: 0.85, PT and DP: monophasic LLE: 0.99, PT and DP: triphasic  L bypass graft duplex (Date: 06/10/11)  L CFA: 140c/s  BPG: prox 261 c/s, mid  87-192 c/s, distal 86-125 c/s  L distal pop: 161 c/s  Medical Decision Making  Alicia [name] is a 61 y.o. female who presents with: s/p L CFA to BK pop bypass with ips non-reversed GSV .  The patient's severe claudication remain resolved but she some residual swelling which should improve with time  Her neuropathic pain remains unchanged from previously.  I doubt her neuropathic pain is related to her surgery. I discussed in depth with the patient the nature of atherosclerosis, and emphasized the importance of maximal medical management including strict control of blood pressure, blood glucose, and lipid levels, obtaining regular exercise, and cessation of smoking. The patient is aware that without maximal medical management the underlying atherosclerotic disease process will progress, limiting the benefit of any interventions.  I discussed in depth with the patient the need for long term surveillance to improve the primary assisted patency of his bypass. The patient agrees to cooperate with such.  In regards to the velocities on the graft duplex, they reflect the underlying PAD in in this patient's left leg. I emphasized the importance of management of her underlying medical conditions as progression of her underlying disease will reverse any gains from her surgical bypass.  The patient is scheduled for the follow surveillance studies: ABI and bypass duplex in 6 months.  Thank you for allowing [place] to participate in this patient's care.  Adele Barthel, MD  [facility] [place]  Office: [phone]  Pager: [phone]

## 2012-04-09 NOTE — Progress Notes (Signed)
Addended by: Mena Goes on: 04/09/2012 08:12 AM   Modules accepted: Orders

## 2012-04-13 NOTE — Procedures (Unsigned)
BYPASS GRAFT EVALUATION  INDICATION:  Followup, left lower extremity bypass graft.  HISTORY: Diabetes:  Yes. Cardiac: Hypertension:  Yes. Smoking:  Current. Previous Surgery:  Left common femoral artery to below-knee popliteal bypass graft performed 02/23/2011.  SINGLE LEVEL ARTERIAL EXAM                              RIGHT              LEFT Brachial: Anterior tibial: Posterior tibial: Peroneal: Ankle/brachial index:        0.58               0.99  PREVIOUS ABI:  Date: 01/06/2012  RIGHT:  0.69  LEFT:  1.21  LOWER EXTREMITY BYPASS GRAFT DUPLEX EXAM:  DUPLEX: 1. Patent left common femoral to below-knee popliteal bypass graft     with triphasic waveforms noted throughout. 2. There is an elevated velocity of 261 cm/s noted at the proximal     anastomosis. 3. There is a hematoma in the medial proximal calf measuring 2.95 X     4.29 cm, which does not appear to be vascularized.  IMPRESSION: 1. Patent left common femoral artery to below-knee popliteal artery     bypass graft with velocity measurements shown on the following     worksheet. 2. Right ankle brachial index is considered in the moderate     claudication range. 3. The left ankle brachial index is 0.99 and considered in the normal     range, although this is a slight decline compared to the last     examination.  ___________________________________________ Conrad Alpine, MD  EM/MEDQ  D:  04/06/2012  T:  04/06/2012  Job:  [id number]

## 2012-10-12 ENCOUNTER — Ambulatory Visit: Payer: Medicare PPO | Admitting: Vascular Surgery

## 2012-11-01 ENCOUNTER — Encounter: Payer: Self-pay | Admitting: Neurosurgery

## 2012-11-02 ENCOUNTER — Ambulatory Visit: Payer: Medicare PPO | Admitting: Neurosurgery

## 2012-11-29 ENCOUNTER — Encounter: Payer: Self-pay | Admitting: Neurosurgery

## 2012-11-30 ENCOUNTER — Encounter (INDEPENDENT_AMBULATORY_CARE_PROVIDER_SITE_OTHER): Payer: Medicare PPO | Admitting: *Deleted

## 2012-11-30 ENCOUNTER — Encounter: Payer: Self-pay | Admitting: Neurosurgery

## 2012-11-30 ENCOUNTER — Ambulatory Visit (INDEPENDENT_AMBULATORY_CARE_PROVIDER_SITE_OTHER): Payer: Medicare PPO | Admitting: Neurosurgery

## 2012-11-30 VITALS — BP 173/101 | HR 81 | Resp 16 | Ht 67.5 in | Wt 271.0 lb

## 2012-11-30 DIAGNOSIS — Z48812 Encounter for surgical aftercare following surgery on the circulatory system: Secondary | ICD-10-CM

## 2012-11-30 DIAGNOSIS — I739 Peripheral vascular disease, unspecified: Secondary | ICD-10-CM

## 2012-11-30 DIAGNOSIS — M79609 Pain in unspecified limb: Secondary | ICD-10-CM

## 2012-11-30 DIAGNOSIS — I70219 Atherosclerosis of native arteries of extremities with intermittent claudication, unspecified extremity: Secondary | ICD-10-CM

## 2012-11-30 NOTE — Addendum Note (Signed)
Addended by: Mena Goes on: 11/30/2012 03:45 PM   Modules accepted: Orders

## 2012-11-30 NOTE — Progress Notes (Signed)
[facility] [place] PAD/PVD Office Note  CC: PAD surveillance Referring Physician: Bridgett Larsson  History of Present Illness: 62 year old female patient of Dr. Bridgett Larsson status post left common femoral artery to popliteal bypass graft in April 2012. The patient denies claudication or rest pain. The patient does have right knee osteoarthritis and pain that has been increasing over the past 2 weeks.  Past Medical History  Diagnosis Date  . Diabetes mellitus   . Hypertension   . Hyperlipidemia   . Shortness of breath   . Constipation   . Chronic headaches   . Arthritis   . Depression   . PAD (peripheral artery disease)   . Obesity     ROS: [x]  Positive   [ ]  Denies    General: [ ]  Weight loss, [ ]  Fever, [ ]  chills Neurologic: [ ]  Dizziness, [ ]  Blackouts, [ ]  Seizure [ ]  Stroke, [ ]  "Mini stroke", [ ]  Slurred speech, [ ]  Temporary blindness; [ ]  weakness in arms or legs, [ ]  Hoarseness Cardiac: [ ]  Chest pain/pressure, [ ]  Shortness of breath at rest [ ]  Shortness of breath with exertion, [ ]  Atrial fibrillation or irregular heartbeat Vascular: [ ]  Pain in legs with walking, [ ]  Pain in legs at rest, [ ]  Pain in legs at night,  [ ]  Non-healing ulcer, [ ]  Blood clot in vein/DVT,   Pulmonary: [ ]  Home oxygen, [ ]  Productive cough, [ ]  Coughing up blood, [ ]  Asthma,  [ ]  Wheezing Musculoskeletal:  [ ]  Arthritis, [ ]  Low back pain, [ ]  Joint pain Hematologic: [ ]  Easy Bruising, [ ]  Anemia; [ ]  Hepatitis Gastrointestinal: [ ]  Blood in stool, [ ]  Gastroesophageal Reflux/heartburn, [ ]  Trouble swallowing Urinary: [ ]  chronic Kidney disease, [ ]  on HD - [ ]  MWF or [ ]  TTHS, [ ]  Burning with urination, [ ]  Difficulty urinating Skin: [ ]  Rashes, [ ]  Wounds Psychological: [ ]  Anxiety, [ ]  Depression   Social History History  Substance Use Topics  . Smoking status: Current Every Day Smoker -- 0.5 packs/day for 35 years    Types: Cigarettes  . Smokeless tobacco: Never Used   Comment: patient is trying to quit and has gone down to half a pack per day  . Alcohol Use: 0.5 oz/week    1 drink(s) per week     Comment: occasional use only    Family History Family History  Problem Relation Age of Onset  . Diabetes Father     No Known Allergies  Current Outpatient Prescriptions  Medication Sig Dispense Refill  . aspirin 81 MG tablet Take 81 mg by mouth daily.        . enalapril (VASOTEC) 20 MG tablet Take 20 mg by mouth 2 (two) times daily.        . famotidine (PEPCID) 20 MG tablet Take 20 mg by mouth at bedtime as needed.        Marland Kitchen HYDROcodone-acetaminophen (VICODIN) 5-500 MG per tablet       . metFORMIN (GLUCOPHAGE) 1000 MG tablet Take 1,000 mg by mouth 2 (two) times daily with a meal.        . naproxen (NAPROSYN) 500 MG tablet Take 500 mg by mouth 2 (two) times daily with a meal.        . NIFEDIAC CC 60 MG 24 hr tablet       . NIFEdipine (ADALAT CC PO) Take 60 mg by mouth 2 (two) times  daily.        . potassium chloride (KLOR-CON) 10 MEQ CR tablet Take 10 mEq by mouth 2 (two) times daily.        . rosuvastatin (CRESTOR) 5 MG tablet Take 5 mg by mouth daily.          Physical Examination  Filed Vitals:   11/30/12 1222  BP: 173/101  Pulse: 81  Resp: 16    Body mass index is 41.82 kg/(m^2).  General:  WDWN in NAD Gait: Normal HEENT: WNL Eyes: Pupils equal Pulmonary: normal non-labored breathing , without Rales, rhonchi,  wheezing Cardiac: RRR, without  Murmurs, rubs or gallops; No carotid bruits Abdomen: soft, NT, no masses Skin: no rashes, ulcers noted Vascular Exam/Pulses: Palpable left lower extremity pulses she does have right femoral pulse  Extremities without ischemic changes, no Gangrene , no cellulitis; no open wounds;  Musculoskeletal: no muscle wasting or atrophy  Neurologic: A&O X 3; Appropriate Affect ; SENSATION: normal; MOTOR FUNCTION:  moving all extremities equally. Speech is fluent/normal  Non-Invasive Vascular Imaging: ABIs  today are 1.28 on the left and triphasic, 0.66 monophasic on the right is a slight increase bilaterally from previous exam. Graft duplex shows a patent graft with 8 elevation in the proximal anastomosis inflow  239  ASSESSMENT/PLAN: Asymptomatic patient will followup in 6 months with repeat ABIs and graft duplex. The patient was encouraged to see an orthopedist about her right knee. The patient states understanding, she is in agreement with this plan.  Beatris Ship ANP  Clinic M.D.: Bridgett Larsson

## 2013-02-02 ENCOUNTER — Emergency Department (HOSPITAL_COMMUNITY)
Admission: EM | Admit: 2013-02-02 | Discharge: 2013-02-02 | Disposition: A | Discharge status: Home / Self Care | Payer: Medicare PPO | Attending: Emergency Medicine | Admitting: Emergency Medicine

## 2013-02-02 ENCOUNTER — Encounter (HOSPITAL_COMMUNITY): Payer: Self-pay | Admitting: *Deleted

## 2013-02-02 DIAGNOSIS — Z8659 Personal history of other mental and behavioral disorders: Secondary | ICD-10-CM | POA: Insufficient documentation

## 2013-02-02 DIAGNOSIS — Z79899 Other long term (current) drug therapy: Secondary | ICD-10-CM | POA: Insufficient documentation

## 2013-02-02 DIAGNOSIS — M5430 Sciatica, unspecified side: Secondary | ICD-10-CM | POA: Insufficient documentation

## 2013-02-02 DIAGNOSIS — E119 Type 2 diabetes mellitus without complications: Secondary | ICD-10-CM | POA: Insufficient documentation

## 2013-02-02 DIAGNOSIS — E785 Hyperlipidemia, unspecified: Secondary | ICD-10-CM | POA: Insufficient documentation

## 2013-02-02 DIAGNOSIS — Z8679 Personal history of other diseases of the circulatory system: Secondary | ICD-10-CM | POA: Insufficient documentation

## 2013-02-02 DIAGNOSIS — M129 Arthropathy, unspecified: Secondary | ICD-10-CM | POA: Insufficient documentation

## 2013-02-02 DIAGNOSIS — I10 Essential (primary) hypertension: Secondary | ICD-10-CM | POA: Insufficient documentation

## 2013-02-02 DIAGNOSIS — Z7982 Long term (current) use of aspirin: Secondary | ICD-10-CM | POA: Insufficient documentation

## 2013-02-02 DIAGNOSIS — F17200 Nicotine dependence, unspecified, uncomplicated: Secondary | ICD-10-CM | POA: Insufficient documentation

## 2013-02-02 DIAGNOSIS — M79609 Pain in unspecified limb: Secondary | ICD-10-CM | POA: Insufficient documentation

## 2013-02-02 DIAGNOSIS — M5432 Sciatica, left side: Secondary | ICD-10-CM

## 2013-02-02 MED ORDER — OXYCODONE-ACETAMINOPHEN 5-325 MG PO TABS
2.0000 | ORAL_TABLET | Freq: Once | ORAL | Status: AC
  Administered 2013-02-02: 2 via ORAL
  Filled 2013-02-02: qty 2

## 2013-02-02 MED ORDER — IBUPROFEN 800 MG PO TABS: 800.0000 mg | ORAL_TABLET | Freq: Three times a day (TID) | ORAL | Status: DC

## 2013-02-02 MED ORDER — CYCLOBENZAPRINE HCL 10 MG PO TABS: 10.0000 mg | ORAL_TABLET | Freq: Two times a day (BID) | ORAL | Status: DC | PRN

## 2013-02-02 MED ORDER — OXYCODONE-ACETAMINOPHEN 5-325 MG PO TABS
1.0000 | ORAL_TABLET | Freq: Once | ORAL | Status: AC
  Administered 2013-02-02: 1 via ORAL
  Filled 2013-02-02: qty 1

## 2013-02-02 MED ORDER — OXYCODONE-ACETAMINOPHEN 5-325 MG PO TABS: 2.0000 | ORAL_TABLET | ORAL | Status: DC | PRN

## 2013-02-02 NOTE — ED Provider Notes (Signed)
History     CSN: [id number]  Arrival date & time 02/02/13  0208   First MD Initiated Contact with Patient 02/02/13 [phone]      Chief Complaint  Patient presents with  . Leg Pain    (Consider location/radiation/quality/duration/timing/severity/associated sxs/prior treatment) HPI  This patient is a 62 yo morbidly obese woman with multiple chronic medical problems including diabetes and peripheral arterial disease. She presents with complaints of pain in the left low back and left buttock which radiates down the posterior aspect of her left leg to the level of the knee. She denies any trauma. Her pain as aching, constant, 9/10 in severity. She denies paresthesias and motor weakness. She has not had any problems with bowel or bladder incontinence. Her pain seems to be worse with prolonged sitting or laying in the supine position.  She denies history of similar symptoms.  Past Medical History  Diagnosis Date  . Diabetes mellitus   . Hypertension   . Hyperlipidemia   . Shortness of breath   . Constipation   . Chronic headaches   . Arthritis   . Depression   . PAD (peripheral artery disease)   . Obesity     Past Surgical History  Procedure Laterality Date  . Femoral bypass  02/23/2011    Left Common Femoral to Below-knee popliteasl BPG   by Dr. Bridgett Larsson    Family History  Problem Relation Age of Onset  . Diabetes Father     History  Substance Use Topics  . Smoking status: Current Every Day Smoker -- 0.50 packs/day for 35 years    Types: Cigarettes  . Smokeless tobacco: Never Used     Comment: patient is trying to quit and has gone down to half a pack per day  . Alcohol Use: 0.5 oz/week    1 drink(s) per week     Comment: occasional use only    OB History   Grav Para Term Preterm Abortions TAB SAB Ect Mult Living                  Review of Systems Gen: no weight loss, fevers, chills, night sweats Eyes: no discharge or drainage, no occular pain or visual  changes Nose: no epistaxis or rhinorrhea Mouth: no dental pain, no sore throat Neck: no neck pain Lungs: no SOB, cough, wheezing CV: no chest pain, palpitations, dependent edema or orthopnea Abd: no abdominal pain, nausea, vomiting GU: no dysuria or gross hematuria MSK: As per history of present illness, otherwise negative Neuro: no headache, no focal neurologic deficits Skin: no rash Psyche: negative.  Allergies  Review of patient's allergies indicates no known allergies.  Home Medications   Current Outpatient Rx  Name  Route  Sig  Dispense  Refill  . aspirin 81 MG tablet   Oral   Take 81 mg by mouth daily.           . Diclofenac (ZORVOLEX) 35 MG CAPS   Oral   Take 1 tablet by mouth 3 (three) times daily as needed (for pain).         . famotidine (PEPCID) 20 MG tablet   Oral   Take 20 mg by mouth 2 (two) times daily.          . metFORMIN (GLUCOPHAGE) 1000 MG tablet   Oral   Take 1,000 mg by mouth 2 (two) times daily with a meal.           . Olmesartan-Amlodipine-HCTZ (TRIBENZOR) 40-5-25 MG  TABS   Oral   Take 1 tablet by mouth daily.         . rosuvastatin (CRESTOR) 5 MG tablet   Oral   Take 5 mg by mouth daily.             BP 170/107  Pulse 85  Temp(Src) 98.7 F (37.1 C) (Oral)  Resp 22  SpO2 97%  Physical Exam Gen: well developed and well nourished appearing, sleeping but easily arousable. Head: NCAT Eyes: PERL, EOMI Nose: no epistaixis or rhinorrhea Mouth/throat: mucosa is moist and pink Neck: supple, no stridor Lungs: CTA B, no wheezing, rhonchi or rales CV: Regular rate and rhythm, no murmurs, there are palpable pulses over the dorsalis pedis bilaterally. Abd: soft, notender, nondistended, at the Back: no midline ttp, no cva ttp, there is tenderness palpation on the left side of the region of the SI joint and over the superior aspect of the left buttock. Skin: no rashese, wnl Neuro: CN ii-xii grossly intact, no focal deficits, patient  has 5 over 5 motor strength in both legs throughout, sensation is intact to light touch throughout. Brisk and symmetric deep tendon reflexes at the patellar tendons. Psyche; normal affect,  calm and cooperative.   ED Course  Procedures (including critical care time)     MDM  Patient with signs and symptoms most consistent with sciatica versus radiculopathy. Her neurologic exam is intact. She has no signs of vascular occlusion as cause of her symptoms. We have treated with Percocet in the emergency department to which she has responded well. She has been taking ibuprofen as an outpatient without adequate relief.  The patient is stable for discharge. I have counseled her regarding the importance of close outpatient followup and discussion with her primary care physician about referral to a physical therapist. We will hold off on any prednisone prescription at this time in light of the patient's poorly controlled diabetes.  The patient was noted to be hypertensive on arrival. This is likely response to pain. She is alerted to this fact as well as her need for followup.        Elyn Peers, MD 02/02/13 0700

## 2013-02-02 NOTE — ED Notes (Signed)
C/o L hip and leg pain, onset 2d ago, radiates down to knee, A&P, denies sx other than pain, worse at night, worse lying down, 10/10, h/o similar, h/o R leg surgery ("bypass"), CMS intact, no edema noted.

## 2013-02-11 ENCOUNTER — Emergency Department (HOSPITAL_COMMUNITY)
Admission: EM | Admit: 2013-02-11 | Discharge: 2013-02-11 | Disposition: A | Discharge status: Home / Self Care | Payer: Medicare PPO | Attending: Emergency Medicine | Admitting: Emergency Medicine

## 2013-02-11 ENCOUNTER — Encounter (HOSPITAL_COMMUNITY): Payer: Self-pay | Admitting: Emergency Medicine

## 2013-02-11 DIAGNOSIS — Z79899 Other long term (current) drug therapy: Secondary | ICD-10-CM | POA: Insufficient documentation

## 2013-02-11 DIAGNOSIS — E669 Obesity, unspecified: Secondary | ICD-10-CM | POA: Insufficient documentation

## 2013-02-11 DIAGNOSIS — F17200 Nicotine dependence, unspecified, uncomplicated: Secondary | ICD-10-CM | POA: Insufficient documentation

## 2013-02-11 DIAGNOSIS — Z7982 Long term (current) use of aspirin: Secondary | ICD-10-CM | POA: Insufficient documentation

## 2013-02-11 DIAGNOSIS — M5432 Sciatica, left side: Secondary | ICD-10-CM

## 2013-02-11 DIAGNOSIS — I10 Essential (primary) hypertension: Secondary | ICD-10-CM | POA: Insufficient documentation

## 2013-02-11 DIAGNOSIS — E785 Hyperlipidemia, unspecified: Secondary | ICD-10-CM | POA: Insufficient documentation

## 2013-02-11 DIAGNOSIS — G8929 Other chronic pain: Secondary | ICD-10-CM | POA: Insufficient documentation

## 2013-02-11 DIAGNOSIS — M5430 Sciatica, unspecified side: Secondary | ICD-10-CM | POA: Insufficient documentation

## 2013-02-11 DIAGNOSIS — R51 Headache: Secondary | ICD-10-CM | POA: Insufficient documentation

## 2013-02-11 DIAGNOSIS — Z8659 Personal history of other mental and behavioral disorders: Secondary | ICD-10-CM | POA: Insufficient documentation

## 2013-02-11 DIAGNOSIS — Z8679 Personal history of other diseases of the circulatory system: Secondary | ICD-10-CM | POA: Insufficient documentation

## 2013-02-11 DIAGNOSIS — E119 Type 2 diabetes mellitus without complications: Secondary | ICD-10-CM

## 2013-02-11 MED ORDER — IBUPROFEN 800 MG PO TABS: 800.0000 mg | ORAL_TABLET | Freq: Three times a day (TID) | ORAL | Status: DC

## 2013-02-11 MED ORDER — KETOROLAC TROMETHAMINE 30 MG/ML IJ SOLN
30.0000 mg | Freq: Once | INTRAMUSCULAR | Status: AC
  Administered 2013-02-11: 30 mg via INTRAVENOUS
  Filled 2013-02-11: qty 1

## 2013-02-11 MED ORDER — PREDNISONE 20 MG PO TABS: ORAL_TABLET | ORAL | Status: DC

## 2013-02-11 MED ORDER — OXYCODONE-ACETAMINOPHEN 5-325 MG PO TABS: 2.0000 | ORAL_TABLET | Freq: Four times a day (QID) | ORAL | Status: DC | PRN

## 2013-02-11 NOTE — ED Notes (Signed)
Pt c/o left hip and leg pain with radiation down leg x 4 days; pt sts seen for same on Friday and out of pain meds

## 2013-02-11 NOTE — ED Provider Notes (Signed)
History     CSN: [id number]  Arrival date & time 02/11/13  [id number]   First MD Initiated Contact with Patient 02/11/13 0913      Chief Complaint  Patient presents with  . Leg Pain   HPI Comments: This is a 62 year old woman with poorly controlled diabetes who presents today with severe aching back pain radiating into her left leg. She presented to the ED on Friday with a similar issue and was given Percocet which she has now finished. The pain is 10/10. There was no trauma. No bowel or bladder incontinence. No paresthesias. Patient is ambulatory.   Patient is a 62 y.o. female presenting with back pain. The history is provided by the patient. No language interpreter was used.  Back Pain Location:  Sacro-iliac joint Quality:  Shooting and aching Radiates to:  L posterior upper leg Pain severity:  Severe Pain is:  Same all the time Onset quality:  Sudden Duration:  4 days Timing:  Constant Progression:  Worsening Chronicity:  New Context: not falling, not lifting heavy objects and not recent injury   Relieved by:  Nothing Worsened by:  Movement, ambulation and palpation Ineffective treatments:  Narcotics (initially narcotics helped the pain - now she states they have stopped working) Associated symptoms: no abdominal pain, no bladder incontinence, no bowel incontinence, no chest pain, no numbness, no paresthesias, no perianal numbness, no tingling and no weakness   Risk factors: obesity   Risk factors: no hx of cancer     Past Medical History  Diagnosis Date  . Diabetes mellitus   . Hypertension   . Hyperlipidemia   . Shortness of breath   . Constipation   . Chronic headaches   . Arthritis   . Depression   . PAD (peripheral artery disease)   . Obesity     Past Surgical History  Procedure Laterality Date  . Femoral bypass  02/23/2011    Left Common Femoral to Below-knee popliteasl BPG   by Dr. Bridgett Larsson    Family History  Problem Relation Age of Onset  . Diabetes Father      History  Substance Use Topics  . Smoking status: Current Every Day Smoker -- 0.50 packs/day for 35 years    Types: Cigarettes  . Smokeless tobacco: Never Used     Comment: patient is trying to quit and has gone down to half a pack per day  . Alcohol Use: 0.5 oz/week    1 drink(s) per week     Comment: occasional use only    OB History   Grav Para Term Preterm Abortions TAB SAB Ect Mult Living                  Review of Systems  Respiratory: Negative for shortness of breath.   Cardiovascular: Negative for chest pain.  Gastrointestinal: Negative for nausea, vomiting, abdominal pain and bowel incontinence.  Genitourinary: Negative for bladder incontinence.  Musculoskeletal: Positive for back pain.  Neurological: Negative for tingling, weakness, numbness and paresthesias.  All other systems reviewed and are negative.    Allergies  Review of patient's allergies indicates no known allergies.  Home Medications   Current Outpatient Rx  Name  Route  Sig  Dispense  Refill  . aspirin 81 MG tablet   Oral   Take 81 mg by mouth daily.           . cyclobenzaprine (FLEXERIL) 10 MG tablet   Oral   Take 1 tablet (10  mg total) by mouth 2 (two) times daily as needed for muscle spasms.   20 tablet   0   . famotidine (PEPCID) 20 MG tablet   Oral   Take 20 mg by mouth 2 (two) times daily.          . metFORMIN (GLUCOPHAGE) 1000 MG tablet   Oral   Take 1,000 mg by mouth 2 (two) times daily with a meal.           . Olmesartan-Amlodipine-HCTZ (TRIBENZOR) 40-5-25 MG TABS   Oral   Take 1 tablet by mouth daily.         Marland Kitchen oxyCODONE-acetaminophen (PERCOCET) 5-325 MG per tablet   Oral   Take 2 tablets by mouth every 4 (four) hours as needed for pain.   15 tablet   0   . rosuvastatin (CRESTOR) 5 MG tablet   Oral   Take 5 mg by mouth daily.             BP 185/102  Pulse 98  Temp(Src) 98 F (36.7 C) (Oral)  Resp 20  Ht 5\' 8"  (1.727 m)  Wt 261 lb (118.389 kg)   BMI 39.69 kg/m2  SpO2 95%  Physical Exam  Nursing note and vitals reviewed. Constitutional: She is oriented to person, place, and time. She appears well-developed and well-nourished. She is cooperative. She appears distressed.  Patient appears very uncomfortable  HENT:  Head: Normocephalic and atraumatic.  Right Ear: External ear normal.  Left Ear: External ear normal.  Nose: Nose normal.  Eyes: Conjunctivae are normal.  Neck: Normal range of motion. No tracheal deviation present.  Cardiovascular: Normal rate, regular rhythm, normal heart sounds and intact distal pulses.   Pulmonary/Chest: Effort normal and breath sounds normal. No stridor. No respiratory distress. She has no wheezes. She has no rales.  Abdominal: Soft. She exhibits no distension. There is no tenderness.  Musculoskeletal: She exhibits tenderness.       Lumbar back: She exhibits tenderness and pain. She exhibits no deformity.  TTP on left SI joint SLR positive on left  Neurological: She is alert and oriented to person, place, and time. She displays normal reflexes. No cranial nerve deficit. She exhibits normal muscle tone. Coordination normal.  Skin: Skin is warm and dry. She is not diaphoretic. No erythema.  Psychiatric: She has a normal mood and affect. Her behavior is normal.    ED Course  Procedures (including critical care time)  Labs Reviewed - No data to display No results found.   1. Sciatica, left   2. Hypertension   3. Diabetes mellitus       MDM  Patient presented today with back pain after being seen in the ED Friday. She has a history of DM which is poorly controlled and PAD. No bowel or bladder incontinence. No signs that claudication is causing her symptoms. Reviewed note from Friday and agree that pain is likely sciatic in nature. Patient given a prednisone taper and percocet for pain control. Discussed importance of monitoring BS and making good diet choices while on the steroid. She voiced  understanding. Has a follow up appointment with PCP next week. In triage patient was hypertensive. Likely due to pain. Patient drove to ED and wanted to drive home - she was given Toradol in ED. Patient / Family / Caregiver informed of clinical course, understand medical decision-making process, and agree with plan.        Elwyn Lade, PA-C 02/12/13 [phone]

## 2013-02-12 NOTE — ED Provider Notes (Signed)
Medical screening examination/treatment/procedure(s) were performed by non-physician practitioner and as supervising physician I was immediately available for consultation/collaboration.   Mirna Mires, MD 02/12/13 1540

## 2013-02-26 ENCOUNTER — Other Ambulatory Visit: Payer: Self-pay | Admitting: Family Medicine

## 2013-02-26 DIAGNOSIS — M545 Low back pain, unspecified: Secondary | ICD-10-CM

## 2013-02-26 DIAGNOSIS — M5410 Radiculopathy, site unspecified: Secondary | ICD-10-CM

## 2013-03-03 ENCOUNTER — Ambulatory Visit
Admission: RE | Admit: 2013-03-03 | Discharge: 2013-03-03 | Disposition: A | Discharge status: Home / Self Care | Payer: Medicare PPO | Source: Ambulatory Visit | Attending: Family Medicine | Admitting: Family Medicine

## 2013-03-03 DIAGNOSIS — M545 Low back pain, unspecified: Secondary | ICD-10-CM

## 2013-03-03 DIAGNOSIS — M5410 Radiculopathy, site unspecified: Secondary | ICD-10-CM

## 2013-03-26 ENCOUNTER — Other Ambulatory Visit: Payer: Self-pay | Admitting: Family Medicine

## 2013-03-26 DIAGNOSIS — R112 Nausea with vomiting, unspecified: Secondary | ICD-10-CM

## 2013-03-26 DIAGNOSIS — R140 Abdominal distension (gaseous): Secondary | ICD-10-CM

## 2013-03-26 DIAGNOSIS — R1011 Right upper quadrant pain: Secondary | ICD-10-CM

## 2013-03-28 ENCOUNTER — Inpatient Hospital Stay: Admission: RE | Admit: 2013-03-28 | Payer: Medicare PPO | Source: Ambulatory Visit

## 2013-03-28 ENCOUNTER — Ambulatory Visit
Admission: RE | Admit: 2013-03-28 | Discharge: 2013-03-28 | Disposition: A | Discharge status: Home / Self Care | Payer: Medicare PPO | Source: Ambulatory Visit | Attending: Family Medicine | Admitting: Family Medicine

## 2013-03-28 DIAGNOSIS — R140 Abdominal distension (gaseous): Secondary | ICD-10-CM

## 2013-03-28 DIAGNOSIS — R1011 Right upper quadrant pain: Secondary | ICD-10-CM

## 2013-03-28 DIAGNOSIS — R112 Nausea with vomiting, unspecified: Secondary | ICD-10-CM

## 2013-05-31 ENCOUNTER — Encounter (INDEPENDENT_AMBULATORY_CARE_PROVIDER_SITE_OTHER): Payer: Medicare PPO | Admitting: *Deleted

## 2013-05-31 ENCOUNTER — Ambulatory Visit: Payer: Medicare PPO | Admitting: Neurosurgery

## 2013-05-31 DIAGNOSIS — Z48812 Encounter for surgical aftercare following surgery on the circulatory system: Secondary | ICD-10-CM

## 2013-05-31 DIAGNOSIS — I739 Peripheral vascular disease, unspecified: Secondary | ICD-10-CM

## 2013-06-03 ENCOUNTER — Other Ambulatory Visit: Payer: Self-pay | Admitting: *Deleted

## 2013-06-03 DIAGNOSIS — Z48812 Encounter for surgical aftercare following surgery on the circulatory system: Secondary | ICD-10-CM

## 2013-06-03 DIAGNOSIS — I739 Peripheral vascular disease, unspecified: Secondary | ICD-10-CM

## 2013-06-04 ENCOUNTER — Encounter: Payer: Self-pay | Admitting: Vascular Surgery

## 2013-07-29 ENCOUNTER — Emergency Department (HOSPITAL_COMMUNITY)
Admission: EM | Admit: 2013-07-29 | Discharge: 2013-07-29 | Disposition: A | Discharge status: Home / Self Care | Payer: Medicare PPO | Source: Home / Self Care | Attending: Family Medicine | Admitting: Family Medicine

## 2013-07-29 ENCOUNTER — Encounter (HOSPITAL_COMMUNITY): Payer: Self-pay

## 2013-07-29 ENCOUNTER — Other Ambulatory Visit (HOSPITAL_COMMUNITY)
Admission: RE | Admit: 2013-07-29 | Discharge: 2013-07-29 | Disposition: A | Discharge status: Home / Self Care | Payer: Medicare PPO | Source: Ambulatory Visit | Attending: Family Medicine | Admitting: Family Medicine

## 2013-07-29 DIAGNOSIS — M5432 Sciatica, left side: Secondary | ICD-10-CM

## 2013-07-29 DIAGNOSIS — N760 Acute vaginitis: Secondary | ICD-10-CM | POA: Insufficient documentation

## 2013-07-29 DIAGNOSIS — B372 Candidiasis of skin and nail: Secondary | ICD-10-CM

## 2013-07-29 DIAGNOSIS — M5430 Sciatica, unspecified side: Secondary | ICD-10-CM

## 2013-07-29 DIAGNOSIS — Z113 Encounter for screening for infections with a predominantly sexual mode of transmission: Secondary | ICD-10-CM | POA: Insufficient documentation

## 2013-07-29 LAB — POCT URINALYSIS DIP (DEVICE)
Bilirubin Urine: NEGATIVE
Ketones, ur: NEGATIVE mg/dL
Leukocytes, UA: NEGATIVE
Specific Gravity, Urine: 1.025 (ref 1.005–1.030)
pH: 7 (ref 5.0–8.0)

## 2013-07-29 MED ORDER — KETOROLAC TROMETHAMINE 30 MG/ML IJ SOLN
30.0000 mg | Freq: Once | INTRAMUSCULAR | Status: AC
  Administered 2013-07-29: 30 mg via INTRAMUSCULAR

## 2013-07-29 MED ORDER — NYSTATIN 100000 UNIT/GM EX POWD: CUTANEOUS | Status: DC

## 2013-07-29 MED ORDER — OXYCODONE-ACETAMINOPHEN 5-325 MG PO TABS: 1.0000 | ORAL_TABLET | Freq: Four times a day (QID) | ORAL | Status: DC | PRN

## 2013-07-29 MED ORDER — DIAZEPAM 5 MG PO TABS: 5.0000 mg | ORAL_TABLET | Freq: Two times a day (BID) | ORAL | Status: DC

## 2013-07-29 MED ORDER — IBUPROFEN 800 MG PO TABS: 800.0000 mg | ORAL_TABLET | Freq: Three times a day (TID) | ORAL | Status: DC

## 2013-07-29 MED ORDER — NYSTATIN 100000 UNIT/GM EX CREA: TOPICAL_CREAM | Freq: Two times a day (BID) | CUTANEOUS | Status: DC

## 2013-07-29 MED ORDER — KETOROLAC TROMETHAMINE 30 MG/ML IJ SOLN
INTRAMUSCULAR | Status: AC
  Filled 2013-07-29: qty 1

## 2013-07-29 NOTE — ED Provider Notes (Signed)
CSN: [id number]     Arrival date & time 07/29/13  1052 History   First MD Initiated Contact with Patient 07/29/13 1416     Chief Complaint  Patient presents with  . Back Pain   (Consider location/radiation/quality/duration/timing/severity/associated sxs/prior Treatment) HPI Comments: Pt had similar sx last spring  Patient is a 62 y.o. female presenting with back pain. The history is provided by the patient.  Back Pain Location:  Sacro-iliac joint and lumbar spine Quality:  Aching and shooting Radiates to:  L posterior upper leg and L thigh Pain severity:  Severe Pain is:  Same all the time Onset quality:  Gradual Duration:  7 days Timing:  Constant Progression:  Worsening Chronicity:  Recurrent Relieved by:  Nothing Worsened by:  Sitting Ineffective treatments:  NSAIDs Associated symptoms: abdominal pain   Associated symptoms: no bladder incontinence, no bowel incontinence, no dysuria, no fever, no numbness and no perianal numbness     Past Medical History  Diagnosis Date  . Diabetes mellitus   . Hypertension   . Hyperlipidemia   . Shortness of breath   . Constipation   . Chronic headaches   . Arthritis   . Depression   . PAD (peripheral artery disease)   . Obesity    Past Surgical History  Procedure Laterality Date  . Femoral bypass  02/23/2011    Left Common Femoral to Below-knee popliteasl BPG   by Dr. Bridgett Larsson   Family History  Problem Relation Age of Onset  . Diabetes Father    History  Substance Use Topics  . Smoking status: Current Every Day Smoker -- 0.50 packs/day for 35 years    Types: Cigarettes  . Smokeless tobacco: Never Used     Comment: patient is trying to quit and has gone down to half a pack per day  . Alcohol Use: 0.5 oz/week    1 drink(s) per week     Comment: occasional use only   OB History   Grav Para Term Preterm Abortions TAB SAB Ect Mult Living                 Review of Systems  Constitutional: Negative for fever and chills.    Gastrointestinal: Positive for abdominal pain. Negative for bowel incontinence.       Thinks left fallopian tube has a problem because she has L lower abd pain assoc with sciatic/back pain; this is same as experience with sciatic pain last spring  Genitourinary: Negative for bladder incontinence, dysuria and vaginal discharge.  Musculoskeletal: Positive for back pain.  Neurological: Negative for numbness.    Allergies  Review of patient's allergies indicates no known allergies.  Home Medications   Current Outpatient Rx  Name  Route  Sig  Dispense  Refill  . aspirin 81 MG tablet   Oral   Take 81 mg by mouth daily.           . cyclobenzaprine (FLEXERIL) 10 MG tablet   Oral   Take 1 tablet (10 mg total) by mouth 2 (two) times daily as needed for muscle spasms.   20 tablet   0   . diazepam (VALIUM) 5 MG tablet   Oral   Take 1 tablet (5 mg total) by mouth 2 (two) times daily.   20 tablet   0   . famotidine (PEPCID) 20 MG tablet   Oral   Take 20 mg by mouth 2 (two) times daily.          Marland Kitchen  ibuprofen (ADVIL,MOTRIN) 800 MG tablet   Oral   Take 1 tablet (800 mg total) by mouth 3 (three) times daily.   21 tablet   0   . metFORMIN (GLUCOPHAGE) 1000 MG tablet   Oral   Take 1,000 mg by mouth 2 (two) times daily with a meal.           . nystatin (MYCOSTATIN/NYSTOP) 100000 UNIT/GM POWD      Apply to dry skin folds twice daily   60 g   1   . nystatin cream (MYCOSTATIN)   Topical   Apply topically 2 (two) times daily.   30 g   1   . Olmesartan-Amlodipine-HCTZ (TRIBENZOR) 40-5-25 MG TABS   Oral   Take 1 tablet by mouth daily.         Marland Kitchen oxyCODONE-acetaminophen (PERCOCET) 5-325 MG per tablet   Oral   Take 2 tablets by mouth every 4 (four) hours as needed for pain.   15 tablet   0   . oxyCODONE-acetaminophen (PERCOCET/ROXICET) 5-325 MG per tablet   Oral   Take 2 tablets by mouth every 6 (six) hours as needed for pain.   15 tablet   0   .  oxyCODONE-acetaminophen (PERCOCET/ROXICET) 5-325 MG per tablet   Oral   Take 1 tablet by mouth every 6 (six) hours as needed for pain.   20 tablet   0   . predniSONE (DELTASONE) 20 MG tablet      3 tabs po daily x 3 days, then 2 tabs x 3 days, then 1.5 tabs x 3 days, then 1 tab x 3 days, then 0.5 tabs x 3 days   27 tablet   0   . rosuvastatin (CRESTOR) 5 MG tablet   Oral   Take 5 mg by mouth daily.            BP 201/82  Pulse 90  Temp(Src) 98.2 F (36.8 C) (Oral)  Resp 22  SpO2 98% Physical Exam  Constitutional: She appears well-developed and well-nourished.  Appears very uncomfortable/in a great deal of pain  Pulmonary/Chest: Effort normal.  Abdominal: There is no tenderness. There is no rigidity, no rebound, no guarding and no CVA tenderness.  Genitourinary: Vagina normal and uterus normal.    There is no rash, tenderness, lesion or injury on the right labia. There is no rash, tenderness, lesion or injury on the left labia. Cervix exhibits no motion tenderness, no discharge and no friability. Right adnexum displays no mass and no tenderness. Left adnexum displays no mass and no tenderness.  Musculoskeletal:       Lumbar back: She exhibits tenderness. She exhibits no bony tenderness, no swelling and no deformity.  L SI joint tender to palp, tender to palp down L buttock  Lymphadenopathy:       Right: No inguinal adenopathy present.       Left: No inguinal adenopathy present.    ED Course  Procedures (including critical care time) Labs Review Labs Reviewed  POCT URINALYSIS DIP (DEVICE) - Abnormal; Notable for the following:    Glucose, UA 250 (*)    Hgb urine dipstick TRACE (*)    Protein, ur 100 (*)    Urobilinogen, UA 4.0 (*)    All other components within normal limits  CERVICOVAGINAL ANCILLARY ONLY   Imaging Review No results found.  MDM   1. Sciatica neuralgia, left   2. Candidal intertrigo    Pt given toradol 30mg  IM for significant relief of  pain,  allowing me to examine her. Findings most c/w sciatica. Referred to ortho. Rx oxycodone/apap 5/325 prn #20. Rx nystatin cream to use for intertrigo until rash healed, also rx nystatin powder to use after rash healed for prevention. Pt to f/u with pcp about diabetes control. Rx ibuprofen 800mg  TID #21; pt to stop naproxen while taking ibuprofen. Rx valium 5mg  BID prn #20 as muscle relaxer.     Carvel Getting, NP 07/29/13 1442

## 2013-07-29 NOTE — ED Notes (Signed)
C/o 7+ days duration of pain lower back into left leg, and "my tube feels like its infected"; noted to be ambulatory w some difficulty

## 2013-07-29 NOTE — ED Provider Notes (Signed)
Medical screening examination/treatment/procedure(s) were performed by resident physician or non-physician practitioner and as supervising physician I was immediately available for consultation/collaboration.   Pauline Good MD.   Billy Fischer, MD 07/29/13 2023

## 2013-07-31 ENCOUNTER — Telehealth (HOSPITAL_COMMUNITY): Payer: Self-pay | Admitting: *Deleted

## 2013-07-31 MED ORDER — METRONIDAZOLE 250 MG PO TABS: 250.0000 mg | ORAL_TABLET | Freq: Three times a day (TID) | ORAL | Status: DC

## 2013-07-31 NOTE — ED Notes (Signed)
GC/Chlamydia neg., Affirm: Candida and Gardnerella neg., Trich pos.  Lab shown to Dr. Juventino Slovak and he e-prescribed Flagyl to [facility] on [address].  I called Alicia [name]. Alicia [name]. verified x 2 and given results.  Alicia [name]. 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  Alicia [name]. instructed to notify her partner to be treated with Flagyl, no sex until you have finished your medication and your partner has been treated and to practice safe sex. You can get HIV testing at the [facility] STD clinic, by appointment.  Alicia [name]. states she has not had sex in 39 yrs. I explained that it can go dormant and flare up again. Alicia [name]. voiced understanding. Roselyn Meier 07/31/2013

## 2014-01-07 ENCOUNTER — Other Ambulatory Visit: Payer: Self-pay | Admitting: Vascular Surgery

## 2014-01-07 DIAGNOSIS — Z48812 Encounter for surgical aftercare following surgery on the circulatory system: Secondary | ICD-10-CM

## 2014-01-07 DIAGNOSIS — I739 Peripheral vascular disease, unspecified: Secondary | ICD-10-CM

## 2014-05-08 ENCOUNTER — Other Ambulatory Visit (HOSPITAL_COMMUNITY)
Admission: RE | Admit: 2014-05-08 | Discharge: 2014-05-08 | Disposition: A | Discharge status: Home / Self Care | Payer: Medicare HMO | Source: Ambulatory Visit | Attending: Family Medicine | Admitting: Family Medicine

## 2014-05-08 ENCOUNTER — Other Ambulatory Visit: Payer: Self-pay | Admitting: Family Medicine

## 2014-05-08 DIAGNOSIS — Z1151 Encounter for screening for human papillomavirus (HPV): Secondary | ICD-10-CM | POA: Insufficient documentation

## 2014-05-08 DIAGNOSIS — Z124 Encounter for screening for malignant neoplasm of cervix: Secondary | ICD-10-CM | POA: Insufficient documentation

## 2014-05-12 LAB — CYTOLOGY - PAP

## 2014-06-05 ENCOUNTER — Encounter: Payer: Self-pay | Admitting: Family

## 2014-06-06 ENCOUNTER — Encounter: Payer: Self-pay | Admitting: Family

## 2014-06-06 ENCOUNTER — Ambulatory Visit (HOSPITAL_COMMUNITY)
Admission: RE | Admit: 2014-06-06 | Discharge: 2014-06-06 | Disposition: A | Discharge status: Home / Self Care | Payer: Medicare HMO | Source: Ambulatory Visit | Attending: Family | Admitting: Family

## 2014-06-06 ENCOUNTER — Ambulatory Visit (INDEPENDENT_AMBULATORY_CARE_PROVIDER_SITE_OTHER): Payer: Commercial Managed Care - HMO | Admitting: Family

## 2014-06-06 ENCOUNTER — Ambulatory Visit (INDEPENDENT_AMBULATORY_CARE_PROVIDER_SITE_OTHER)
Admission: RE | Admit: 2014-06-06 | Discharge: 2014-06-06 | Disposition: A | Discharge status: Home / Self Care | Payer: Medicare HMO | Source: Ambulatory Visit | Attending: Vascular Surgery | Admitting: Vascular Surgery

## 2014-06-06 VITALS — BP 194/107 | HR 73 | Resp 16 | Ht 68.0 in | Wt 261.0 lb

## 2014-06-06 DIAGNOSIS — I739 Peripheral vascular disease, unspecified: Secondary | ICD-10-CM | POA: Insufficient documentation

## 2014-06-06 DIAGNOSIS — R200 Anesthesia of skin: Secondary | ICD-10-CM | POA: Insufficient documentation

## 2014-06-06 DIAGNOSIS — M7989 Other specified soft tissue disorders: Secondary | ICD-10-CM | POA: Insufficient documentation

## 2014-06-06 DIAGNOSIS — M79605 Pain in left leg: Secondary | ICD-10-CM

## 2014-06-06 DIAGNOSIS — M79609 Pain in unspecified limb: Secondary | ICD-10-CM

## 2014-06-06 DIAGNOSIS — Z48812 Encounter for surgical aftercare following surgery on the circulatory system: Secondary | ICD-10-CM

## 2014-06-06 DIAGNOSIS — R29898 Other symptoms and signs involving the musculoskeletal system: Secondary | ICD-10-CM | POA: Insufficient documentation

## 2014-06-06 DIAGNOSIS — R209 Unspecified disturbances of skin sensation: Secondary | ICD-10-CM

## 2014-06-06 NOTE — Progress Notes (Signed)
[facility] [place] HISTORY AND PHYSICAL -PAD  History of Present Illness Alicia [name] is a 63 y.o. female patient of Dr. Bridgett Larsson who is status post left common femoral artery to popliteal bypass graft in April 2012. She returns today for routine surveillance. She has mild claudication in both calves with walking moderate distance, denies non healing wounds. Does water aerobics about 5 days/week. She denies any history of stroke, TIA, or MI.  She has lost about 40 pounds with effort.   The patient denies New Medical or Surgical History.  Pt Diabetic: Yes, does not know if in control Pt smoker: smoker  (1/2 ppd, started in her 20's)  Pt meds include: Statin :No,  this caused worse weakness in her legs ASA: Not taking lately, advised to resume Other anticoagulants/antiplatelets: no  Past Medical History  Diagnosis Date  . Diabetes mellitus   . Hypertension   . Hyperlipidemia   . Shortness of breath   . Constipation   . Chronic headaches   . Arthritis   . Depression   . PAD (peripheral artery disease)   . Obesity     Social History History  Substance Use Topics  . Smoking status: Current Every Day Smoker -- 0.50 packs/day for 35 years    Types: Cigarettes  . Smokeless tobacco: Never Used     Comment: patient is trying to quit and has gone down to half a pack per day  . Alcohol Use: 0.5 oz/week    1 drink(s) per week     Comment: occasional use only    Family History Family History  Problem Relation Age of Onset  . Diabetes Father     Past Surgical History  Procedure Laterality Date  . Femoral bypass  02/23/2011    Left Common Femoral to Below-knee popliteasl BPG   by Dr. Bridgett Larsson    No Known Allergies  Current Outpatient Prescriptions  Medication Sig Dispense Refill  . aspirin 81 MG tablet Take 81 mg by mouth daily.        . cyclobenzaprine (FLEXERIL) 10 MG tablet Take 1 tablet (10 mg total) by mouth 2 (two) times daily as needed for  muscle spasms.  20 tablet  0  . diazepam (VALIUM) 5 MG tablet Take 1 tablet (5 mg total) by mouth 2 (two) times daily.  20 tablet  0  . famotidine (PEPCID) 20 MG tablet Take 20 mg by mouth 2 (two) times daily.       Marland Kitchen ibuprofen (ADVIL,MOTRIN) 800 MG tablet Take 1 tablet (800 mg total) by mouth 3 (three) times daily.  21 tablet  0  . metFORMIN (GLUCOPHAGE) 1000 MG tablet Take 1,000 mg by mouth 2 (two) times daily with a meal.        . metroNIDAZOLE (FLAGYL) 250 MG tablet Take 1 tablet (250 mg total) by mouth 3 (three) times daily.  21 tablet  0  . nystatin (MYCOSTATIN/NYSTOP) 100000 UNIT/GM POWD Apply to dry skin folds twice daily  60 g  1  . nystatin cream (MYCOSTATIN) Apply topically 2 (two) times daily.  30 g  1  . Olmesartan-Amlodipine-HCTZ (TRIBENZOR) 40-5-25 MG TABS Take 1 tablet by mouth daily.      Marland Kitchen oxyCODONE-acetaminophen (PERCOCET) 5-325 MG per tablet Take 2 tablets by mouth every 4 (four) hours as needed for pain.  15 tablet  0  . oxyCODONE-acetaminophen (PERCOCET/ROXICET) 5-325 MG per tablet Take 2 tablets by mouth every 6 (six) hours as needed for pain.  15 tablet  0  . oxyCODONE-acetaminophen (PERCOCET/ROXICET) 5-325 MG per tablet Take 1 tablet by mouth every 6 (six) hours as needed for pain.  20 tablet  0  . predniSONE (DELTASONE) 20 MG tablet 3 tabs po daily x 3 days, then 2 tabs x 3 days, then 1.5 tabs x 3 days, then 1 tab x 3 days, then 0.5 tabs x 3 days  27 tablet  0  . rosuvastatin (CRESTOR) 5 MG tablet Take 5 mg by mouth daily.         No current facility-administered medications for this visit.    ROS: See HPI for pertinent positives and negatives.   Physical Examination  Filed Vitals:   06/06/14 1504 06/06/14 1515  BP: 199/92 194/107  Pulse: 76 73  Resp:  16  Height:  5\' 8"  (1.727 m)  Weight:  261 lb (118.389 kg)  SpO2:  99%   Body mass index is 39.69 kg/(m^2).  General: A&O x 3, WDWN, obese female. Gait: normal Eyes: PERRLA. Pulmonary: CTAB, without  wheezes , rales or rhonchi. Cardiac: regular Rythm , without detected murmur.         Carotid Bruits Left Right   Negative Negative  Aorta is not palpable. Radial pulses: are 2+ palpable and =                           VASCULAR EXAM: Extremities without ischemic changes  without Gangrene; without open wounds.                                                                                                          LE Pulses LEFT RIGHT       FEMORAL   palpable   palpable        POPLITEAL  not palpable   not palpable       POSTERIOR TIBIAL  2+ palpable   not palpable        DORSALIS PEDIS      ANTERIOR TIBIAL 1+ palpable  not palpable    Abdomen: soft, NT, no masses. Skin: no rashes, no ulcers noted. Musculoskeletal: no muscle wasting or atrophy.  Neurologic: A&O X 3; Appropriate Affect ; SENSATION: normal; MOTOR FUNCTION:  moving all extremities equally, motor strength 5/5 throughout. Speech is fluent/normal. CN 2-12 intact.    Non-Invasive Vascular Imaging: DATE: 06/06/2014 LOWER EXTREMITY ARTERIAL DUPLEX EVALUATION    INDICATION: Follow up bypass graft    PREVIOUS INTERVENTION(S): Left femoral to below knee popliteal bypass graft 02/23/2011.    DUPLEX EXAM: Left lower extremity duplex    RIGHT  LEFT   Peak Systolic Velocity (cm/s) Ratio (if abnormal) Waveform  Peak Systolic Velocity (cm/s) Ratio (if abnormal) Waveform     Inflow Artery 165  T     Proximal Anastomosis 188  T     Proximal Graft 43  B     Mid Graft 78  T      Distal Graft 63  T     Distal Anastomosis 95  T     Outflow Artery 89  B-T  .64 Today's ABI / TBI 1.02  .62 Previous ABI / TBI (05/31/13  ) 1.15    Waveform:    M - Monophasic       B - Biphasic       T - Triphasic  If Ankle Brachial Index (ABI) or Toe Brachial Index (TBI) performed, please see complete report     ADDITIONAL FINDINGS:     IMPRESSION: 1. Patent left femoral to popliteal bypass graft with no evidence for restenosis.     Compared to the previous exam:  No significant change.    ASSESSMENT: Alicia [name] is a 63 y.o. female who is status post left common femoral artery to popliteal bypass graft in April 2012. Left lower extremity Duplex today demonstrates patent left femoral to popliteal bypass graft with no evidence for restenosis and no significant change from previous Duplex.  She has DM, continues to smoke, and is obese, and her hypertension is uncontrolled today, but she exercises most days of the week. She stopped taking a daily ASA, not on medical advice, and advised to resume.  See Plan.  PLAN:  Resume daily 81 mg ASA. Pt advised to follow up ASAP with her PCP re her uncontrolled hypertension.  She was counseled re smoking cessation.  I discussed in depth with the patient the nature of atherosclerosis, and emphasized the importance of maximal medical management including strict control of blood pressure, blood glucose, and lipid levels, obtaining regular exercise, and cessation of smoking.  The patient is aware that without maximal medical management the underlying atherosclerotic disease process will progress, limiting the benefit of any interventions.  Based on the patient's vascular studies and examination, pt will return to clinic in 1 year for ABI's and left LE arterial Duplex.  The patient was given information about PAD including signs, symptoms, treatment, what symptoms should prompt the patient to seek immediate medical care, and risk reduction measures to take.  Clemon Chambers, RN, MSN, FNP-C [facility] [employer] Phone: [phone]  Clinic MD: Bridgett Larsson  06/06/2014 2:41 PM

## 2014-06-06 NOTE — Patient Instructions (Addendum)
Peripheral Vascular Disease Peripheral Vascular Disease (PVD), also called Peripheral Arterial Disease (PAD), is a circulation problem caused by cholesterol (atherosclerotic plaque) deposits in the arteries. PVD commonly occurs in the lower extremities (legs) but it can occur in other areas of the body, such as your arms. The cholesterol buildup in the arteries reduces blood flow which can cause pain and other serious problems. The presence of PVD can place a person at risk for Coronary Artery Disease (CAD).  CAUSES  Causes of PVD can be many. It is usually associated with more than one risk factor such as:   High Cholesterol.  Smoking.  Diabetes.  Lack of exercise or inactivity.  High blood pressure (hypertension).  Obesity.  Family history. SYMPTOMS   When the lower extremities are affected, patients with PVD may experience:  Leg pain with exertion or physical activity. This is called INTERMITTENT CLAUDICATION. This may present as cramping or numbness with physical activity. The location of the pain is associated with the level of blockage. For example, blockage at the abdominal level (distal abdominal aorta) may result in buttock or hip pain. Lower leg arterial blockage may result in calf pain.  As PVD becomes more severe, pain can develop with less physical activity.  In people with severe PVD, leg pain may occur at rest.  Other PVD signs and symptoms:  Leg numbness or weakness.  Coldness in the affected leg or foot, especially when compared to the other leg.  A change in leg color.  Patients with significant PVD are more prone to ulcers or sores on toes, feet or legs. These may take longer to heal or may reoccur. The ulcers or sores can become infected.  If signs and symptoms of PVD are ignored, gangrene may occur. This can result in the loss of toes or loss of an entire limb.  Not all leg pain is related to PVD. Other medical conditions can cause leg pain such  as:  Blood clots (embolism) or Deep Vein Thrombosis.  Inflammation of the blood vessels (vasculitis).  Spinal stenosis. DIAGNOSIS  Diagnosis of PVD can involve several different types of tests. These can include:  Pulse Volume Recording Method (PVR). This test is simple, painless and does not involve the use of X-rays. PVR involves measuring and comparing the blood pressure in the arms and legs. An ABI (Ankle-Brachial Index) is calculated. The normal ratio of blood pressures is 1. As this number becomes smaller, it indicates more severe disease.  < 0.95 - indicates significant narrowing in one or more leg vessels.  <0.8 - there will usually be pain in the foot, leg or buttock with exercise.  <0.4 - will usually have pain in the legs at rest.  <0.25 - usually indicates limb threatening PVD.  Doppler detection of pulses in the legs. This test is painless and checks to see if you have a pulses in your legs/feet.  A dye or contrast material (a substance that highlights the blood vessels so they show up on x-ray) may be given to help your caregiver better see the arteries for the following tests. The dye is eliminated from your body by the kidney's. Your caregiver may order blood work to check your kidney function and other laboratory values before the following tests are performed:  Magnetic Resonance Angiography (MRA). An MRA is a picture study of the blood vessels and arteries. The MRA machine uses a large magnet to produce images of the blood vessels.  Computed Tomography Angiography (CTA). A CTA   is a specialized x-ray that looks at how the blood flows in your blood vessels. An IV may be inserted into your arm so contrast dye can be injected.  Angiogram. Is a procedure that uses x-rays to look at your blood vessels. This procedure is minimally invasive, meaning a small incision (cut) is made in your groin. A small tube (catheter) is then inserted into the artery of your groin. The catheter  is guided to the blood vessel or artery your caregiver wants to examine. Contrast dye is injected into the catheter. X-rays are then taken of the blood vessel or artery. After the images are obtained, the catheter is taken out. TREATMENT  Treatment of PVD involves many interventions which may include:  Lifestyle changes:  Quitting smoking.  Exercise.  Following a low fat, low cholesterol diet.  Control of diabetes.  Foot care is very important to the PVD patient. Good foot care can help prevent infection.  Medication:  Cholesterol-lowering medicine.  Blood pressure medicine.  Anti-platelet drugs.  Certain medicines may reduce symptoms of Intermittent Claudication.  Interventional/Surgical options:  Angioplasty. An Angioplasty is a procedure that inflates a balloon in the blocked artery. This opens the blocked artery to improve blood flow.  Stent Implant. A wire mesh tube (stent) is placed in the artery. The stent expands and stays in place, allowing the artery to remain open.  Peripheral Bypass Surgery. This is a surgical procedure that reroutes the blood around a blocked artery to help improve blood flow. This type of procedure may be performed if Angioplasty or stent implants are not an option. SEEK IMMEDIATE MEDICAL CARE IF:   You develop pain or numbness in your arms or legs.  Your arm or leg turns cold, becomes blue in color.  You develop redness, warmth, swelling and pain in your arms or legs. MAKE SURE YOU:   Understand these instructions.  Will watch your condition.  Will get help right away if you are not doing well or get worse. Document Released: 12/15/2004 Document Revised: 01/30/2012 Document Reviewed: 11/11/2008 [employer] Patient Information 2015 [employer]. This information is not intended to replace advice given to you by your health care provider. Make sure you discuss any questions you have with your health care provider.  Smoking  Cessation Quitting smoking is important to your health and has many advantages. However, it is not always easy to quit since nicotine is a very addictive drug. Often times, people try 3 times or more before being able to quit. This document explains the best ways for you to prepare to quit smoking. Quitting takes hard work and a lot of effort, but you can do it. ADVANTAGES OF QUITTING SMOKING  You will live longer, feel better, and live better.  Your body will feel the impact of quitting smoking almost immediately.  Within 20 minutes, blood pressure decreases. Your pulse returns to its normal level.  After 8 hours, carbon monoxide levels in the blood return to normal. Your oxygen level increases.  After 24 hours, the chance of having a heart attack starts to decrease. Your breath, hair, and body stop smelling like smoke.  After 48 hours, damaged nerve endings begin to recover. Your sense of taste and smell improve.  After 72 hours, the body is virtually free of nicotine. Your bronchial tubes relax and breathing becomes easier.  After 2 to 12 weeks, lungs can hold more air. Exercise becomes easier and circulation improves.  The risk of having a heart attack, stroke, cancer,   or lung disease is greatly reduced.  After 1 year, the risk of coronary heart disease is cut in half.  After 5 years, the risk of stroke falls to the same as a nonsmoker.  After 10 years, the risk of lung cancer is cut in half and the risk of other cancers decreases significantly.  After 15 years, the risk of coronary heart disease drops, usually to the level of a nonsmoker.  If you are pregnant, quitting smoking will improve your chances of having a healthy baby.  The people you live with, especially any children, will be healthier.  You will have extra money to spend on things other than cigarettes. QUESTIONS TO THINK ABOUT BEFORE ATTEMPTING TO QUIT You may want to talk about your answers with your  caregiver.  Why do you want to quit?  If you tried to quit in the past, what helped and what did not?  What will be the most difficult situations for you after you quit? How will you plan to handle them?  Who can help you through the tough times? Your family? Friends? A caregiver?  What pleasures do you get from smoking? What ways can you still get pleasure if you quit? Here are some questions to ask your caregiver:  How can you help me to be successful at quitting?  What medicine do you think would be best for me and how should I take it?  What should I do if I need more help?  What is smoking withdrawal like? How can I get information on withdrawal? GET READY  Set a quit date.  Change your environment by getting rid of all cigarettes, ashtrays, matches, and lighters in your home, car, or work. Do not let people smoke in your home.  Review your past attempts to quit. Think about what worked and what did not. GET SUPPORT AND ENCOURAGEMENT You have a better chance of being successful if you have help. You can get support in many ways.  Tell your family, friends, and co-workers that you are going to quit and need their support. Ask them not to smoke around you.  Get individual, group, or telephone counseling and support. Programs are available at local hospitals and health centers. Call your local health department for information about programs in your area.  Spiritual beliefs and practices may help some smokers quit.  Download a "quit meter" on your computer to keep track of quit statistics, such as how long you have gone without smoking, cigarettes not smoked, and money saved.  Get a self-help book about quitting smoking and staying off of tobacco. LEARN NEW SKILLS AND BEHAVIORS  Distract yourself from urges to smoke. Talk to someone, go for a walk, or occupy your time with a task.  Change your normal routine. Take a different route to work. Drink tea instead of coffee.  Eat breakfast in a different place.  Reduce your stress. Take a hot bath, exercise, or read a book.  Plan something enjoyable to do every day. Reward yourself for not smoking.  Explore interactive web-based programs that specialize in helping you quit. GET MEDICINE AND USE IT CORRECTLY Medicines can help you stop smoking and decrease the urge to smoke. Combining medicine with the above behavioral methods and support can greatly increase your chances of successfully quitting smoking.  Nicotine replacement therapy helps deliver nicotine to your body without the negative effects and risks of smoking. Nicotine replacement therapy includes nicotine gum, lozenges, inhalers, nasal sprays, and skin patches.   Some may be available over-the-counter and others require a prescription.  Antidepressant medicine helps people abstain from smoking, but how this works is unknown. This medicine is available by prescription.  Nicotinic receptor partial agonist medicine simulates the effect of nicotine in your brain. This medicine is available by prescription. Ask your caregiver for advice about which medicines to use and how to use them based on your health history. Your caregiver will tell you what side effects to look out for if you choose to be on a medicine or therapy. Carefully read the information on the package. Do not use any other product containing nicotine while using a nicotine replacement product.  RELAPSE OR DIFFICULT SITUATIONS Most relapses occur within the first 3 months after quitting. Do not be discouraged if you start smoking again. Remember, most people try several times before finally quitting. You may have symptoms of withdrawal because your body is used to nicotine. You may crave cigarettes, be irritable, feel very hungry, cough often, get headaches, or have difficulty concentrating. The withdrawal symptoms are only temporary. They are strongest when you first quit, but they will go away within  10-14 days. To reduce the chances of relapse, try to:  Avoid drinking alcohol. Drinking lowers your chances of successfully quitting.  Reduce the amount of caffeine you consume. Once you quit smoking, the amount of caffeine in your body increases and can give you symptoms, such as a rapid heartbeat, sweating, and anxiety.  Avoid smokers because they can make you want to smoke.  Do not let weight gain distract you. Many smokers will gain weight when they quit, usually less than 10 pounds. Eat a healthy diet and stay active. You can always lose the weight gained after you quit.  Find ways to improve your mood other than smoking. FOR MORE INFORMATION  www.smokefree.gov  Document Released: 11/01/2001 Document Revised: 05/08/2012 Document Reviewed: 02/16/2012 [employer] Patient Information 2015 [employer]. This information is not intended to replace advice given to you by your health care provider. Make sure you discuss any questions you have with your health care provider.  

## 2014-12-12 ENCOUNTER — Ambulatory Visit: Payer: Commercial Managed Care - HMO | Admitting: Family

## 2014-12-12 ENCOUNTER — Other Ambulatory Visit (HOSPITAL_COMMUNITY): Payer: Commercial Managed Care - HMO

## 2014-12-12 ENCOUNTER — Encounter (HOSPITAL_COMMUNITY): Payer: Commercial Managed Care - HMO

## 2015-06-01 DIAGNOSIS — H5210 Myopia, unspecified eye: Secondary | ICD-10-CM | POA: Diagnosis not present

## 2015-06-01 DIAGNOSIS — H524 Presbyopia: Secondary | ICD-10-CM | POA: Diagnosis not present

## 2015-06-09 ENCOUNTER — Encounter: Payer: Self-pay | Admitting: Family

## 2015-06-12 ENCOUNTER — Ambulatory Visit: Payer: Commercial Managed Care - HMO | Admitting: Family

## 2015-06-12 ENCOUNTER — Ambulatory Visit (HOSPITAL_COMMUNITY)
Admission: RE | Admit: 2015-06-12 | Discharge: 2015-06-12 | Disposition: A | Discharge status: Home / Self Care | Payer: Commercial Managed Care - HMO | Source: Ambulatory Visit | Attending: Family | Admitting: Family

## 2015-06-12 ENCOUNTER — Other Ambulatory Visit (HOSPITAL_COMMUNITY): Payer: Commercial Managed Care - HMO

## 2015-06-12 ENCOUNTER — Other Ambulatory Visit: Payer: Self-pay | Admitting: Family

## 2015-06-12 ENCOUNTER — Ambulatory Visit (INDEPENDENT_AMBULATORY_CARE_PROVIDER_SITE_OTHER)
Admission: RE | Admit: 2015-06-12 | Discharge: 2015-06-12 | Disposition: A | Discharge status: Home / Self Care | Payer: Commercial Managed Care - HMO | Source: Ambulatory Visit | Attending: Family | Admitting: Family

## 2015-06-12 ENCOUNTER — Encounter (HOSPITAL_COMMUNITY): Payer: Commercial Managed Care - HMO

## 2015-06-12 ENCOUNTER — Ambulatory Visit (INDEPENDENT_AMBULATORY_CARE_PROVIDER_SITE_OTHER): Payer: Commercial Managed Care - HMO | Admitting: Family

## 2015-06-12 ENCOUNTER — Encounter: Payer: Self-pay | Admitting: Family

## 2015-06-12 VITALS — BP 183/93 | HR 78 | Temp 98.0°F | Resp 16 | Ht 67.5 in | Wt 265.0 lb

## 2015-06-12 DIAGNOSIS — Z95828 Presence of other vascular implants and grafts: Secondary | ICD-10-CM

## 2015-06-12 DIAGNOSIS — E1151 Type 2 diabetes mellitus with diabetic peripheral angiopathy without gangrene: Secondary | ICD-10-CM

## 2015-06-12 DIAGNOSIS — I739 Peripheral vascular disease, unspecified: Secondary | ICD-10-CM

## 2015-06-12 DIAGNOSIS — Z48812 Encounter for surgical aftercare following surgery on the circulatory system: Secondary | ICD-10-CM

## 2015-06-12 DIAGNOSIS — Z4889 Encounter for other specified surgical aftercare: Secondary | ICD-10-CM

## 2015-06-12 DIAGNOSIS — Z9889 Other specified postprocedural states: Secondary | ICD-10-CM | POA: Diagnosis not present

## 2015-06-12 DIAGNOSIS — E1159 Type 2 diabetes mellitus with other circulatory complications: Secondary | ICD-10-CM

## 2015-06-12 NOTE — Patient Instructions (Addendum)
Peripheral Vascular Disease Peripheral Vascular Disease (PVD), also called Peripheral Arterial Disease (PAD), is a circulation problem caused by cholesterol (atherosclerotic plaque) deposits in the arteries. PVD commonly occurs in the lower extremities (legs) but it can occur in other areas of the body, such as your arms. The cholesterol buildup in the arteries reduces blood flow which can cause pain and other serious problems. The presence of PVD can place a person at risk for Coronary Artery Disease (CAD).  CAUSES  Causes of PVD can be many. It is usually associated with more than one risk factor such as:   High Cholesterol.  Smoking.  Diabetes.  Lack of exercise or inactivity.  High blood pressure (hypertension).  Obesity.  Family history. SYMPTOMS   When the lower extremities are affected, patients with PVD may experience:  Leg pain with exertion or physical activity. This is called INTERMITTENT CLAUDICATION. This may present as cramping or numbness with physical activity. The location of the pain is associated with the level of blockage. For example, blockage at the abdominal level (distal abdominal aorta) may result in buttock or hip pain. Lower leg arterial blockage may result in calf pain.  As PVD becomes more severe, pain can develop with less physical activity.  In people with severe PVD, leg pain may occur at rest.  Other PVD signs and symptoms:  Leg numbness or weakness.  Coldness in the affected leg or foot, especially when compared to the other leg.  A change in leg color.  Patients with significant PVD are more prone to ulcers or sores on toes, feet or legs. These may take longer to heal or may reoccur. The ulcers or sores can become infected.  If signs and symptoms of PVD are ignored, gangrene may occur. This can result in the loss of toes or loss of an entire limb.  Not all leg pain is related to PVD. Other medical conditions can cause leg pain such  as:  Blood clots (embolism) or Deep Vein Thrombosis.  Inflammation of the blood vessels (vasculitis).  Spinal stenosis. DIAGNOSIS  Diagnosis of PVD can involve several different types of tests. These can include:  Pulse Volume Recording Method (PVR). This test is simple, painless and does not involve the use of X-rays. PVR involves measuring and comparing the blood pressure in the arms and legs. An ABI (Ankle-Brachial Index) is calculated. The normal ratio of blood pressures is 1. As this number becomes smaller, it indicates more severe disease.  < 0.95 - indicates significant narrowing in one or more leg vessels.  <0.8 - there will usually be pain in the foot, leg or buttock with exercise.  <0.4 - will usually have pain in the legs at rest.  <0.25 - usually indicates limb threatening PVD.  Doppler detection of pulses in the legs. This test is painless and checks to see if you have a pulses in your legs/feet.  A dye or contrast material (a substance that highlights the blood vessels so they show up on x-ray) may be given to help your caregiver better see the arteries for the following tests. The dye is eliminated from your body by the kidney's. Your caregiver may order blood work to check your kidney function and other laboratory values before the following tests are performed:  Magnetic Resonance Angiography (MRA). An MRA is a picture study of the blood vessels and arteries. The MRA machine uses a large magnet to produce images of the blood vessels.  Computed Tomography Angiography (CTA). A CTA   is a specialized x-ray that looks at how the blood flows in your blood vessels. An IV may be inserted into your arm so contrast dye can be injected.  Angiogram. Is a procedure that uses x-rays to look at your blood vessels. This procedure is minimally invasive, meaning a small incision (cut) is made in your groin. A small tube (catheter) is then inserted into the artery of your groin. The catheter  is guided to the blood vessel or artery your caregiver wants to examine. Contrast dye is injected into the catheter. X-rays are then taken of the blood vessel or artery. After the images are obtained, the catheter is taken out. TREATMENT  Treatment of PVD involves many interventions which may include:  Lifestyle changes:  Quitting smoking.  Exercise.  Following a low fat, low cholesterol diet.  Control of diabetes.  Foot care is very important to the PVD patient. Good foot care can help prevent infection.  Medication:  Cholesterol-lowering medicine.  Blood pressure medicine.  Anti-platelet drugs.  Certain medicines may reduce symptoms of Intermittent Claudication.  Interventional/Surgical options:  Angioplasty. An Angioplasty is a procedure that inflates a balloon in the blocked artery. This opens the blocked artery to improve blood flow.  Stent Implant. A wire mesh tube (stent) is placed in the artery. The stent expands and stays in place, allowing the artery to remain open.  Peripheral Bypass Surgery. This is a surgical procedure that reroutes the blood around a blocked artery to help improve blood flow. This type of procedure may be performed if Angioplasty or stent implants are not an option. SEEK IMMEDIATE MEDICAL CARE IF:   You develop pain or numbness in your arms or legs.  Your arm or leg turns cold, becomes blue in color.  You develop redness, warmth, swelling and pain in your arms or legs. MAKE SURE YOU:   Understand these instructions.  Will watch your condition.  Will get help right away if you are not doing well or get worse. Document Released: 12/15/2004 Document Revised: 01/30/2012 Document Reviewed: 11/11/2008 Exit[employer] Patient Information 2015 [employer]. This information is not intended to replace advice given to you by your health care provider. Make sure you discuss any questions you have with your health care provider.    Smoking  Cessation Quitting smoking is important to your health and has many advantages. However, it is not always easy to quit since nicotine is a very addictive drug. Oftentimes, people try 3 times or more before being able to quit. This document explains the best ways for you to prepare to quit smoking. Quitting takes hard work and a lot of effort, but you can do it. ADVANTAGES OF QUITTING SMOKING  You will live longer, feel better, and live better.  Your body will feel the impact of quitting smoking almost immediately.  Within 20 minutes, blood pressure decreases. Your pulse returns to its normal level.  After 8 hours, carbon monoxide levels in the blood return to normal. Your oxygen level increases.  After 24 hours, the chance of having a heart attack starts to decrease. Your breath, hair, and body stop smelling like smoke.  After 48 hours, damaged nerve endings begin to recover. Your sense of taste and smell improve.  After 72 hours, the body is virtually free of nicotine. Your bronchial tubes relax and breathing becomes easier.  After 2 to 12 weeks, lungs can hold more air. Exercise becomes easier and circulation improves.  The risk of having a heart attack, stroke,   cancer, or lung disease is greatly reduced.  After 1 year, the risk of coronary heart disease is cut in half.  After 5 years, the risk of stroke falls to the same as a nonsmoker.  After 10 years, the risk of lung cancer is cut in half and the risk of other cancers decreases significantly.  After 15 years, the risk of coronary heart disease drops, usually to the level of a nonsmoker.  If you are pregnant, quitting smoking will improve your chances of having a healthy baby.  The people you live with, especially any children, will be healthier.  You will have extra money to spend on things other than cigarettes. QUESTIONS TO THINK ABOUT BEFORE ATTEMPTING TO QUIT You may want to talk about your answers with your health care  provider.  Why do you want to quit?  If you tried to quit in the past, what helped and what did not?  What will be the most difficult situations for you after you quit? How will you plan to handle them?  Who can help you through the tough times? Your family? Friends? A health care provider?  What pleasures do you get from smoking? What ways can you still get pleasure if you quit? Here are some questions to ask your health care provider:  How can you help me to be successful at quitting?  What medicine do you think would be best for me and how should I take it?  What should I do if I need more help?  What is smoking withdrawal like? How can I get information on withdrawal? GET READY  Set a quit date.  Change your environment by getting rid of all cigarettes, ashtrays, matches, and lighters in your home, car, or work. Do not let people smoke in your home.  Review your past attempts to quit. Think about what worked and what did not. GET SUPPORT AND ENCOURAGEMENT You have a better chance of being successful if you have help. You can get support in many ways.  Tell your family, friends, and coworkers that you are going to quit and need their support. Ask them not to smoke around you.  Get individual, group, or telephone counseling and support. Programs are available at local hospitals and health centers. Call your local health department for information about programs in your area.  Spiritual beliefs and practices may help some smokers quit.  Download a "quit meter" on your computer to keep track of quit statistics, such as how long you have gone without smoking, cigarettes not smoked, and money saved.  Get a self-help book about quitting smoking and staying off tobacco. LEARN NEW SKILLS AND BEHAVIORS  Distract yourself from urges to smoke. Talk to someone, go for a walk, or occupy your time with a task.  Change your normal routine. Take a different route to work. Drink tea  instead of coffee. Eat breakfast in a different place.  Reduce your stress. Take a hot bath, exercise, or read a book.  Plan something enjoyable to do every day. Reward yourself for not smoking.  Explore interactive web-based programs that specialize in helping you quit. GET MEDICINE AND USE IT CORRECTLY Medicines can help you stop smoking and decrease the urge to smoke. Combining medicine with the above behavioral methods and support can greatly increase your chances of successfully quitting smoking.  Nicotine replacement therapy helps deliver nicotine to your body without the negative effects and risks of smoking. Nicotine replacement therapy includes nicotine gum, lozenges,   inhalers, nasal sprays, and skin patches. Some may be available over-the-counter and others require a prescription.  Antidepressant medicine helps people abstain from smoking, but how this works is unknown. This medicine is available by prescription.  Nicotinic receptor partial agonist medicine simulates the effect of nicotine in your brain. This medicine is available by prescription. Ask your health care provider for advice about which medicines to use and how to use them based on your health history. Your health care provider will tell you what side effects to look out for if you choose to be on a medicine or therapy. Carefully read the information on the package. Do not use any other product containing nicotine while using a nicotine replacement product.  RELAPSE OR DIFFICULT SITUATIONS Most relapses occur within the first 3 months after quitting. Do not be discouraged if you start smoking again. Remember, most people try several times before finally quitting. You may have symptoms of withdrawal because your body is used to nicotine. You may crave cigarettes, be irritable, feel very hungry, cough often, get headaches, or have difficulty concentrating. The withdrawal symptoms are only temporary. They are strongest when you  first quit, but they will go away within 10-14 days. To reduce the chances of relapse, try to:  Avoid drinking alcohol. Drinking lowers your chances of successfully quitting.  Reduce the amount of caffeine you consume. Once you quit smoking, the amount of caffeine in your body increases and can give you symptoms, such as a rapid heartbeat, sweating, and anxiety.  Avoid smokers because they can make you want to smoke.  Do not let weight gain distract you. Many smokers will gain weight when they quit, usually less than 10 pounds. Eat a healthy diet and stay active. You can always lose the weight gained after you quit.  Find ways to improve your mood other than smoking. FOR MORE INFORMATION  www.smokefree.gov  Document Released: 11/01/2001 Document Revised: 03/24/2014 Document Reviewed: 02/16/2012 [employer] Patient Information 2015 [employer]. This information is not intended to replace advice given to you by your health care provider. Make sure you discuss any questions you have with your health care provider.    Smoking Cessation, Tips for Success If you are ready to quit smoking, congratulations! You have chosen to help yourself be healthier. Cigarettes bring nicotine, tar, carbon monoxide, and other irritants into your body. Your lungs, heart, and blood vessels will be able to work better without these poisons. There are many different ways to quit smoking. Nicotine gum, nicotine patches, a nicotine inhaler, or nicotine nasal spray can help with physical craving. Hypnosis, support groups, and medicines help break the habit of smoking. WHAT THINGS CAN I DO TO MAKE QUITTING EASIER?  Here are some tips to help you quit for good:  Pick a date when you will quit smoking completely. Tell all of your friends and family about your plan to quit on that date.  Do not try to slowly cut down on the number of cigarettes you are smoking. Pick a quit date and quit smoking completely starting on that  day.  Throw away all cigarettes.   Clean and remove all ashtrays from your home, work, and car.  On a card, write down your reasons for quitting. Carry the card with you and read it when you get the urge to smoke.  Cleanse your body of nicotine. Drink enough water and fluids to keep your urine clear or pale yellow. Do this after quitting to flush the nicotine from   your body.  Learn to predict your moods. Do not let a bad situation be your excuse to have a cigarette. Some situations in your life might tempt you into wanting a cigarette.  Never have "just one" cigarette. It leads to wanting another and another. Remind yourself of your decision to quit.  Change habits associated with smoking. If you smoked while driving or when feeling stressed, try other activities to replace smoking. Stand up when drinking your coffee. Brush your teeth after eating. Sit in a different chair when you read the paper. Avoid alcohol while trying to quit, and try to drink fewer caffeinated beverages. Alcohol and caffeine may urge you to smoke.  Avoid foods and drinks that can trigger a desire to smoke, such as sugary or spicy foods and alcohol.  Ask people who smoke not to smoke around you.  Have something planned to do right after eating or having a cup of coffee. For example, plan to take a walk or exercise.  Try a relaxation exercise to calm you down and decrease your stress. Remember, you may be tense and nervous for the first 2 weeks after you quit, but this will pass.  Find new activities to keep your hands busy. Play with a pen, coin, or rubber band. Doodle or draw things on paper.  Brush your teeth right after eating. This will help cut down on the craving for the taste of tobacco after meals. You can also try mouthwash.   Use oral substitutes in place of cigarettes. Try using lemon drops, carrots, cinnamon sticks, or chewing gum. Keep them handy so they are available when you have the urge to  smoke.  When you have the urge to smoke, try deep breathing.  Designate your home as a nonsmoking area.  If you are a heavy smoker, ask your health care provider about a prescription for nicotine chewing gum. It can ease your withdrawal from nicotine.  Reward yourself. Set aside the cigarette money you save and buy yourself something nice.  Look for support from others. Join a support group or smoking cessation program. Ask someone at home or at work to help you with your plan to quit smoking.  Always ask yourself, "Do I need this cigarette or is this just a reflex?" Tell yourself, "Today, I choose not to smoke," or "I do not want to smoke." You are reminding yourself of your decision to quit.  Do not replace cigarette smoking with electronic cigarettes (commonly called e-cigarettes). The safety of e-cigarettes is unknown, and some may contain harmful chemicals.  If you relapse, do not give up! Plan ahead and think about what you will do the next time you get the urge to smoke. HOW WILL I FEEL WHEN I QUIT SMOKING? You may have symptoms of withdrawal because your body is used to nicotine (the addictive substance in cigarettes). You may crave cigarettes, be irritable, feel very hungry, cough often, get headaches, or have difficulty concentrating. The withdrawal symptoms are only temporary. They are strongest when you first quit but will go away within 10-14 days. When withdrawal symptoms occur, stay in control. Think about your reasons for quitting. Remind yourself that these are signs that your body is healing and getting used to being without cigarettes. Remember that withdrawal symptoms are easier to treat than the major diseases that smoking can cause.  Even after the withdrawal is over, expect periodic urges to smoke. However, these cravings are generally short lived and will go away whether you   smoke or not. Do not smoke! WHAT RESOURCES ARE AVAILABLE TO HELP ME QUIT SMOKING? Your health care  provider can direct you to community resources or hospitals for support, which may include:  Group support.  Education.  Hypnosis.  Therapy. Document Released: 08/05/2004 Document Revised: 03/24/2014 Document Reviewed: 04/25/2013 [employer] Patient Information 2015 [employer]. This information is not intended to replace advice given to you by your health care provider. Make sure you discuss any questions you have with your health care provider.  

## 2015-06-12 NOTE — Progress Notes (Signed)
Filed Vitals:   06/12/15 1418 06/12/15 1425  BP: 186/99 183/93  Pulse: 78   Temp: 98 F (36.7 C)   TempSrc: Oral   Resp: 16   Height: 5' 7.5" (1.715 m)   Weight: 265 lb (120.203 kg)   SpO2: 98%

## 2015-06-12 NOTE — Progress Notes (Signed)
[facility] [place] HISTORY AND PHYSICAL -PAD  History of Present Illness Alicia [name] is a 64 y.o. female patient of Dr. Bridgett Larsson who is status post left common femoral artery to popliteal bypass graft in April 2012. She returns today for routine surveillance. She has pain in her low back and a weak feelking in both thighs and calves after walking about 1/2 block, worse in the left leg; she is not sure if the weakness resolves with rest, denies non healing wounds. She cannot remember if she had her lumbar spine evaluated, states she has generalized arthritis.   Does water aerobics about 5 days/week. She denies any history of stroke, TIA, or MI.   The patient denies New Medical or Surgical History.  Pt Diabetic: Yes, does not know if in control Pt smoker: smoker (1/2 ppd, started in her 20's)  Pt meds include: Statin :No, this caused worse weakness in her legs ASA: Not taking lately, advised to resume Other anticoagulants/antiplatelets: no    Past Medical History  Diagnosis Date  . Diabetes mellitus   . Hypertension   . Hyperlipidemia   . Shortness of breath   . Constipation   . Chronic headaches   . Arthritis   . Depression   . PAD (peripheral artery disease)   . Obesity     Social History History  Substance Use Topics  . Smoking status: Light Tobacco Smoker -- 0.50 packs/day for 35 years    Types: Cigarettes  . Smokeless tobacco: Never Used     Comment: patient is trying to quit and has gone down to half a pack per day  . Alcohol Use: 0.5 oz/week    1 Standard drinks or equivalent per week     Comment: occasional use only    Family History Family History  Problem Relation Age of Onset  . Diabetes Father   . Heart disease Mother     NOT before age 45-  Bypass  . Hypertension Mother   . Hypertension Sister   . Varicose Veins Brother   . Heart disease Brother     Before age 42  . Hypertension Daughter     Past Surgical  History  Procedure Laterality Date  . Femoral bypass  02/23/2011    Left Common Femoral to Below-knee popliteasl BPG   by Dr. Bridgett Larsson    No Known Allergies  Current Outpatient Prescriptions  Medication Sig Dispense Refill  . aspirin 81 MG tablet Take 81 mg by mouth daily.      . cyclobenzaprine (FLEXERIL) 10 MG tablet Take 1 tablet (10 mg total) by mouth 2 (two) times daily as needed for muscle spasms. 20 tablet 0  . diazepam (VALIUM) 5 MG tablet Take 1 tablet (5 mg total) by mouth 2 (two) times daily. 20 tablet 0  . enalapril (VASOTEC) 20 MG tablet 2 (two) times daily.    . famotidine (PEPCID) 20 MG tablet Take 20 mg by mouth 2 (two) times daily.     Marland Kitchen ibuprofen (ADVIL,MOTRIN) 800 MG tablet Take 1 tablet (800 mg total) by mouth 3 (three) times daily. 21 tablet 0  . metFORMIN (GLUCOPHAGE) 1000 MG tablet Take 1,000 mg by mouth 2 (two) times daily with a meal.      . metroNIDAZOLE (FLAGYL) 250 MG tablet Take 1 tablet (250 mg total) by mouth 3 (three) times daily. 21 tablet 0  . NIFEdipine (PROCARDIA-XL/ADALAT CC) 60 MG 24 hr tablet 2 (two) times daily.    Marland Kitchen  nystatin (MYCOSTATIN/NYSTOP) 100000 UNIT/GM POWD Apply to dry skin folds twice daily 60 g 1  . nystatin cream (MYCOSTATIN) Apply topically 2 (two) times daily. 30 g 1  . Olmesartan-Amlodipine-HCTZ (TRIBENZOR) 40-5-25 MG TABS Take 1 tablet by mouth daily.    . predniSONE (DELTASONE) 20 MG tablet 3 tabs po daily x 3 days, then 2 tabs x 3 days, then 1.5 tabs x 3 days, then 1 tab x 3 days, then 0.5 tabs x 3 days 27 tablet 0  . oxyCODONE-acetaminophen (PERCOCET) 5-325 MG per tablet Take 2 tablets by mouth every 4 (four) hours as needed for pain. (Patient not taking: Reported on 06/12/2015) 15 tablet 0  . oxyCODONE-acetaminophen (PERCOCET/ROXICET) 5-325 MG per tablet Take 2 tablets by mouth every 6 (six) hours as needed for pain. (Patient not taking: Reported on 06/12/2015) 15 tablet 0  . oxyCODONE-acetaminophen (PERCOCET/ROXICET) 5-325 MG per tablet  Take 1 tablet by mouth every 6 (six) hours as needed for pain. (Patient not taking: Reported on 06/12/2015) 20 tablet 0  . rosuvastatin (CRESTOR) 5 MG tablet Take 5 mg by mouth daily.       No current facility-administered medications for this visit.    ROS: See HPI for pertinent positives and negatives.   Physical Examination  Filed Vitals:   06/12/15 1418 06/12/15 1425  BP: 186/99 183/93  Pulse: 78   Temp: 98 F (36.7 C)   TempSrc: Oral   Resp: 16   Height: 5' 7.5" (1.715 m)   Weight: 265 lb (120.203 kg)   SpO2: 98%    Body mass index is 40.87 kg/(m^2).  General: A&O x 3, WDWN, obese female. Gait: normal Eyes: PERRLA. Pulmonary: CTAB, without wheezes , rales or rhonchi. Cardiac: regular Rythm , without detected murmur.     Carotid Bruits Left Right   Negative Negative  Aorta is not palpable. Radial pulses: are 2+ palpable and =   VASCULAR EXAM: Extremities without ischemic changes  without Gangrene; without open wounds.     LE Pulses LEFT RIGHT   FEMORAL  palpable  palpable    POPLITEAL not palpable  not palpable   POSTERIOR TIBIAL 2+ palpable  not palpable    DORSALIS PEDIS  ANTERIOR TIBIAL 1+ palpable  not palpable    Abdomen: soft, obese, NT, no palpable masses. Skin: no rashes, no ulcers. Musculoskeletal: no muscle wasting or atrophy. Neurologic: A&O X 3; Appropriate Affect, MOTOR FUNCTION: moving all extremities equally, motor strength 5/5 throughout. Speech is fluent/normal. CN 2-12 intact.         Non-Invasive Vascular Imaging: DATE: 06/12/2015 LOWER EXTREMITY ARTERIAL DUPLEX EVALUATION    INDICATION: Peripheral Vascular Disease    PREVIOUS INTERVENTION(S): Left femoral-popliteal artery bypass graft 02/22/2014.     DUPLEX EXAM:     RIGHT  LEFT   Peak Systolic Velocity (cm/s) Ratio (if abnormal) Waveform  Peak Systolic Velocity (cm/s) Ratio (if abnormal) Waveform     Inflow Artery 194  T     Proximal Anastomosis 202  T     Proximal Graft 49  T     Mid Graft 72  T      Distal Graft 72  T     Distal Anastomosis 102  T     Outflow Artery 82/136/66  T/T/B  0.63/0.54 Today's ABI / TBI 0.92/0.88  0.64/0.42 Previous ABI / TBI (06/06/2014  ) 1.02/0.65    Waveform:    M - Monophasic       B - Biphasic  T - Triphasic  If Ankle Brachial Index (ABI) or Toe Brachial Index (TBI) performed, please see complete report  ADDITIONAL FINDINGS:     IMPRESSION: Patent left lower extremity femoral - popliteal artery bypass graft. Patent arterial outflow involving the left posterior tibial artery and peroneal artery. Dampened phasicity of the left anterior tibial artery.    Compared to the previous exam:  Stable right ankle brachial index and minimal decrease of left since previous study on 06/06/2014.     ASSESSMENT: TAMIKI HAMMAC is a 64 y.o. female who is status post left common femoral artery to popliteal bypass graft in April 2012. She has weakness in both legs with standing or walking, worse in the left leg. However, the left leg ABI is normal with triphasic waveforms; right leg ABI remains stable with moderate arterial occlusive disease.  The pain in her left leg and low back is not related to arterial occlusive disease, but is consistent with radiculopathy. Her atherosclerotic risk factors include and remain smoking, uncontrolled DM, and obesity.   Face to face time with patient was 25 minutes. Over 50% of this time was spent on counseling and coordination of care.   PLAN:  Recumbent bike or some form of regular exercise that does not aggravate her low back and left leg pain.  The patient was counseled re smoking cessation and given several free resources re smoking cessation.  Based on  the patient's vascular studies and examination, pt will return to clinic in 1 year with ABI's and left LE arterial Duplex.   I discussed in depth with the patient the nature of atherosclerosis, and emphasized the importance of maximal medical management including strict control of blood pressure, blood glucose, and lipid levels, obtaining regular exercise, and cessation of smoking.  The patient is aware that without maximal medical management the underlying atherosclerotic disease process will progress, limiting the benefit of any interventions.  The patient was given information about PAD including signs, symptoms, treatment, what symptoms should prompt the patient to seek immediate medical care, and risk reduction measures to take.  Clemon Chambers, RN, MSN, FNP-C [facility] Phone: [phone]  Clinic MD: Bridgett Larsson  06/12/2015 2:36 PM

## 2015-06-18 DIAGNOSIS — I10 Essential (primary) hypertension: Secondary | ICD-10-CM | POA: Diagnosis not present

## 2015-06-18 DIAGNOSIS — E669 Obesity, unspecified: Secondary | ICD-10-CM | POA: Diagnosis not present

## 2015-06-18 DIAGNOSIS — E119 Type 2 diabetes mellitus without complications: Secondary | ICD-10-CM | POA: Diagnosis not present

## 2015-07-22 DIAGNOSIS — E669 Obesity, unspecified: Secondary | ICD-10-CM | POA: Diagnosis not present

## 2015-07-22 DIAGNOSIS — E119 Type 2 diabetes mellitus without complications: Secondary | ICD-10-CM | POA: Diagnosis not present

## 2015-07-22 DIAGNOSIS — I10 Essential (primary) hypertension: Secondary | ICD-10-CM | POA: Diagnosis not present

## 2015-08-13 DIAGNOSIS — E119 Type 2 diabetes mellitus without complications: Secondary | ICD-10-CM | POA: Diagnosis not present

## 2015-08-13 DIAGNOSIS — I10 Essential (primary) hypertension: Secondary | ICD-10-CM | POA: Diagnosis not present

## 2015-08-27 DIAGNOSIS — Z1231 Encounter for screening mammogram for malignant neoplasm of breast: Secondary | ICD-10-CM | POA: Diagnosis not present

## 2015-11-27 DIAGNOSIS — E7800 Pure hypercholesterolemia, unspecified: Secondary | ICD-10-CM | POA: Diagnosis not present

## 2015-11-27 DIAGNOSIS — E119 Type 2 diabetes mellitus without complications: Secondary | ICD-10-CM | POA: Diagnosis not present

## 2015-11-27 DIAGNOSIS — J0100 Acute maxillary sinusitis, unspecified: Secondary | ICD-10-CM | POA: Diagnosis not present

## 2015-11-27 DIAGNOSIS — E785 Hyperlipidemia, unspecified: Secondary | ICD-10-CM | POA: Diagnosis not present

## 2015-11-27 DIAGNOSIS — I10 Essential (primary) hypertension: Secondary | ICD-10-CM | POA: Diagnosis not present

## 2015-11-27 DIAGNOSIS — E669 Obesity, unspecified: Secondary | ICD-10-CM | POA: Diagnosis not present

## 2015-12-18 DIAGNOSIS — I10 Essential (primary) hypertension: Secondary | ICD-10-CM | POA: Diagnosis not present

## 2016-05-13 DIAGNOSIS — I10 Essential (primary) hypertension: Secondary | ICD-10-CM | POA: Diagnosis not present

## 2016-05-13 DIAGNOSIS — E119 Type 2 diabetes mellitus without complications: Secondary | ICD-10-CM | POA: Diagnosis not present

## 2016-05-13 DIAGNOSIS — E669 Obesity, unspecified: Secondary | ICD-10-CM | POA: Diagnosis not present

## 2016-05-13 DIAGNOSIS — E785 Hyperlipidemia, unspecified: Secondary | ICD-10-CM | POA: Diagnosis not present

## 2016-05-16 DIAGNOSIS — E669 Obesity, unspecified: Secondary | ICD-10-CM | POA: Diagnosis not present

## 2016-05-16 DIAGNOSIS — E119 Type 2 diabetes mellitus without complications: Secondary | ICD-10-CM | POA: Diagnosis not present

## 2016-05-16 DIAGNOSIS — I10 Essential (primary) hypertension: Secondary | ICD-10-CM | POA: Diagnosis not present

## 2016-05-16 DIAGNOSIS — E785 Hyperlipidemia, unspecified: Secondary | ICD-10-CM | POA: Diagnosis not present

## 2016-06-03 DIAGNOSIS — E669 Obesity, unspecified: Secondary | ICD-10-CM | POA: Diagnosis not present

## 2016-06-03 DIAGNOSIS — M130 Polyarthritis, unspecified: Secondary | ICD-10-CM | POA: Diagnosis not present

## 2016-06-03 DIAGNOSIS — E785 Hyperlipidemia, unspecified: Secondary | ICD-10-CM | POA: Diagnosis not present

## 2016-06-03 DIAGNOSIS — I10 Essential (primary) hypertension: Secondary | ICD-10-CM | POA: Diagnosis not present

## 2016-06-07 ENCOUNTER — Other Ambulatory Visit: Payer: Self-pay | Admitting: *Deleted

## 2016-06-07 DIAGNOSIS — I739 Peripheral vascular disease, unspecified: Secondary | ICD-10-CM

## 2016-06-07 DIAGNOSIS — Z48812 Encounter for surgical aftercare following surgery on the circulatory system: Secondary | ICD-10-CM

## 2016-06-09 ENCOUNTER — Encounter: Payer: Self-pay | Admitting: Family

## 2016-06-09 DIAGNOSIS — Z0100 Encounter for examination of eyes and vision without abnormal findings: Secondary | ICD-10-CM | POA: Diagnosis not present

## 2016-06-09 DIAGNOSIS — E119 Type 2 diabetes mellitus without complications: Secondary | ICD-10-CM | POA: Diagnosis not present

## 2016-06-13 ENCOUNTER — Encounter: Payer: Self-pay | Admitting: Family

## 2016-06-13 ENCOUNTER — Ambulatory Visit (INDEPENDENT_AMBULATORY_CARE_PROVIDER_SITE_OTHER): Payer: Commercial Managed Care - HMO | Admitting: Family

## 2016-06-13 ENCOUNTER — Ambulatory Visit (HOSPITAL_COMMUNITY)
Admission: RE | Admit: 2016-06-13 | Discharge: 2016-06-13 | Disposition: A | Discharge status: Home / Self Care | Payer: Commercial Managed Care - HMO | Source: Ambulatory Visit | Attending: Vascular Surgery | Admitting: Vascular Surgery

## 2016-06-13 ENCOUNTER — Ambulatory Visit (INDEPENDENT_AMBULATORY_CARE_PROVIDER_SITE_OTHER)
Admission: RE | Admit: 2016-06-13 | Discharge: 2016-06-13 | Disposition: A | Discharge status: Home / Self Care | Payer: Commercial Managed Care - HMO | Source: Ambulatory Visit | Attending: Family | Admitting: Family

## 2016-06-13 VITALS — BP 184/88 | HR 70 | Temp 97.5°F | Resp 18 | Ht 67.75 in | Wt 260.0 lb

## 2016-06-13 DIAGNOSIS — I739 Peripheral vascular disease, unspecified: Secondary | ICD-10-CM | POA: Insufficient documentation

## 2016-06-13 DIAGNOSIS — Z48812 Encounter for surgical aftercare following surgery on the circulatory system: Secondary | ICD-10-CM

## 2016-06-13 DIAGNOSIS — I10 Essential (primary) hypertension: Secondary | ICD-10-CM | POA: Diagnosis not present

## 2016-06-13 DIAGNOSIS — Z4889 Encounter for other specified surgical aftercare: Secondary | ICD-10-CM | POA: Diagnosis not present

## 2016-06-13 DIAGNOSIS — F329 Major depressive disorder, single episode, unspecified: Secondary | ICD-10-CM | POA: Diagnosis not present

## 2016-06-13 DIAGNOSIS — I779 Disorder of arteries and arterioles, unspecified: Secondary | ICD-10-CM

## 2016-06-13 DIAGNOSIS — E1151 Type 2 diabetes mellitus with diabetic peripheral angiopathy without gangrene: Secondary | ICD-10-CM | POA: Diagnosis not present

## 2016-06-13 DIAGNOSIS — E119 Type 2 diabetes mellitus without complications: Secondary | ICD-10-CM | POA: Diagnosis not present

## 2016-06-13 DIAGNOSIS — R0989 Other specified symptoms and signs involving the circulatory and respiratory systems: Secondary | ICD-10-CM | POA: Diagnosis present

## 2016-06-13 DIAGNOSIS — Z95828 Presence of other vascular implants and grafts: Secondary | ICD-10-CM

## 2016-06-13 DIAGNOSIS — E785 Hyperlipidemia, unspecified: Secondary | ICD-10-CM | POA: Diagnosis not present

## 2016-06-13 NOTE — Progress Notes (Signed)
[facility] [place]   CC: Follow up peripheral artery occlusive disease  History of Present Illness Alicia [name] is a 65 y.o. female patient of Dr. Bridgett Larsson who is status post left common femoral artery to popliteal bypass graft in April 2012. She returns today for routine surveillance. She has pain in her low back and a weak feelking in both thighs and calves after walking about 1/2 block, worse in the left leg; she is not sure if the weakness resolves with rest, denies non healing wounds. She cannot remember if she had her lumbar spine evaluated, states she has generalized arthritis.   Does water aerobics about 5 days/week. She denies any history of stroke, TIA, or MI.   The patient denies New Medical or Surgical History.  Pt Diabetic: Yes, states her A1C improved to 7.9 Pt smoker: smoker (1/2 ppd, started in her 20's)  Pt meds include: Statin :No, this caused worse weakness in her legs ASA: Not taking lately, advised to resume Other anticoagulants/antiplatelets: no     Past Medical History:  Diagnosis Date  . Arthritis   . Chronic headaches   . Constipation   . Depression   . Diabetes mellitus   . Hyperlipidemia   . Hypertension   . Obesity   . PAD (peripheral artery disease) ([place])   . Shortness of breath     Social History Social History  Substance Use Topics  . Smoking status: Light Tobacco Smoker    Packs/day: 0.50    Years: 35.00    Types: Cigarettes  . Smokeless tobacco: Never Used     Comment: patient is trying to quit and has gone down to half a pack per day  . Alcohol use 0.5 oz/week    1 Standard drinks or equivalent per week     Comment: occasional use only    Family History Family History  Problem Relation Age of Onset  . Diabetes Father   . Heart disease Mother     NOT before age 38-  Bypass  . Hypertension Mother   . Hypertension Sister   . Varicose Veins Brother   . Heart disease Brother     Before age  41  . Hypertension Daughter     Past Surgical History:  Procedure Laterality Date  . FEMORAL BYPASS  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 Bridgett Larsson    No Known Allergies  Current Outpatient Prescriptions  Medication Sig Dispense Refill  . aspirin 81 MG tablet Take 81 mg by mouth daily.      . diclofenac (VOLTAREN) 50 MG EC tablet Take 50 mg by mouth 3 (three) times daily.    . empagliflozin (JARDIANCE) 10 MG TABS tablet Take 10 mg by mouth daily.    . enalapril (VASOTEC) 20 MG tablet 2 (two) times daily.    . famotidine (PEPCID) 20 MG tablet Take 20 mg by mouth 2 (two) times daily.     . metFORMIN (GLUCOPHAGE) 1000 MG tablet Take 1,000 mg by mouth 2 (two) times daily with a meal.      . NIFEdipine (PROCARDIA-XL/ADALAT CC) 60 MG 24 hr tablet 2 (two) times daily.    Marland Kitchen nystatin cream (MYCOSTATIN) Apply topically 2 (two) times daily. 30 g 1  . cyclobenzaprine (FLEXERIL) 10 MG tablet Take 1 tablet (10 mg total) by mouth 2 (two) times daily as needed for muscle spasms. (Patient not taking: Reported on 06/13/2016) 20 tablet 0  .  diazepam (VALIUM) 5 MG tablet Take 1 tablet (5 mg total) by mouth 2 (two) times daily. (Patient not taking: Reported on 06/13/2016) 20 tablet 0  . ibuprofen (ADVIL,MOTRIN) 800 MG tablet Take 1 tablet (800 mg total) by mouth 3 (three) times daily. (Patient not taking: Reported on 06/13/2016) 21 tablet 0  . metroNIDAZOLE (FLAGYL) 250 MG tablet Take 1 tablet (250 mg total) by mouth 3 (three) times daily. (Patient not taking: Reported on 06/13/2016) 21 tablet 0  . nystatin (MYCOSTATIN/NYSTOP) 100000 UNIT/GM POWD Apply to dry skin folds twice daily (Patient not taking: Reported on 06/13/2016) 60 g 1  . Olmesartan-Amlodipine-HCTZ (TRIBENZOR) 40-5-25 MG TABS Take 1 tablet by mouth daily.    Marland Kitchen oxyCODONE-acetaminophen (PERCOCET) 5-325 MG per tablet Take 2 tablets by mouth every 4 (four) hours as needed for pain. (Patient not taking: Reported on 06/12/2015)  15 tablet 0  . oxyCODONE-acetaminophen (PERCOCET/ROXICET) 5-325 MG per tablet Take 2 tablets by mouth every 6 (six) hours as needed for pain. (Patient not taking: Reported on 06/12/2015) 15 tablet 0  . oxyCODONE-acetaminophen (PERCOCET/ROXICET) 5-325 MG per tablet Take 1 tablet by mouth every 6 (six) hours as needed for pain. (Patient not taking: Reported on 06/12/2015) 20 tablet 0  . predniSONE (DELTASONE) 20 MG tablet 3 tabs po daily x 3 days, then 2 tabs x 3 days, then 1.5 tabs x 3 days, then 1 tab x 3 days, then 0.5 tabs x 3 days (Patient not taking: Reported on 06/13/2016) 27 tablet 0  . rosuvastatin (CRESTOR) 5 MG tablet Take 5 mg by mouth daily.       No current facility-administered medications for this visit.     ROS: See HPI for pertinent positives and negatives.   Physical Examination  Vitals:   06/13/16 1046 06/13/16 1050  BP: (!) 188/89 (!) 184/88  Pulse: 70   Resp: 18   Temp: 97.5 F (36.4 C)   TempSrc: Oral   SpO2: 99%   Weight: 260 lb (117.9 kg)   Height: 5' 7.75" (1.721 m)    Body mass index is 39.83 kg/m.  General: A&O x 3, WDWN, obese female. Gait: normal Eyes: PERRLA. Pulmonary: Respirations are non labored, CTAB, without wheezes , rales or rhonchi. Cardiac: regular rhythm, no detected murmur.     Carotid Bruits Left Right   Negative Negative  Aorta is not palpable. Radial pulses: are 2+ palpable and =   VASCULAR EXAM: Extremities without ischemic changes  without Gangrene; without open wounds.     LE Pulses LEFT RIGHT   FEMORAL  palpable  palpable    POPLITEAL not palpable  not palpable   POSTERIOR TIBIAL 2+ palpable  not palpable    DORSALIS PEDIS  ANTERIOR TIBIAL 1+ palpable  not palpable    Abdomen: soft, obese, NT, no  palpable masses. Skin: no rashes, no ulcers. Musculoskeletal: no muscle wasting or atrophy. Neurologic: A&O X 3; Appropriate Affect, MOTOR FUNCTION: moving all extremities equally, motor strength 5/5 throughout. Speech is fluent/normal. CN 2-12 intact.   ASSESSMENT: Alicia [name] is a 65 y.o. female who is status post left common femoral artery to popliteal bypass graft in April 2012. She has weakness in both legs with standing or walking, worse in the left leg. However, the left leg ABI is 90% with triphasic waveforms; right leg ABI remains stable with 60% perfusion (moderate arterial occlusive disease).  Slightly elevated velocities in the proximal anastomosis compared to the last exam (321 cm/s today compared to 202 cm/s  a year ago). The pain in her left leg and low back is not likely related to arterial occlusive disease, but is consistent with radiculopathy. Her atherosclerotic risk factors include remain smoking, uncontrolled DM, uncontrolled hypertension, and obesity.   Pt saw her PCP 3 days ago re her elevated blood pressure, pt states she is scheduled to return to see her PCP next week re her blood pressure and DM.  Face to face time with patient was 25 minutes. Over 50% of this time was spent on counseling and coordination of care.   PLAN:  The patient was counseled re smoking cessation and given several free resources re smoking cessation.   Based on the patient's vascular studies and examination, and after discussing the elevated proximal anastomosis with Dr. Trula Slade compared to a year ago, pt will return to clinic in 6 months with LLE arterial duplex and ABI's, see me on a day that Dr. Bridgett Larsson is in the office. I discussed in depth with the patient the nature of atherosclerosis, and emphasized the importance of maximal medical management including strict control of blood pressure, blood glucose, and lipid levels, obtaining regular exercise, and cessation of smoking.   The patient is aware that without maximal medical management the underlying atherosclerotic disease process will progress, limiting the benefit of any interventions.  The patient was given information about PAD including signs, symptoms, treatment, what symptoms should prompt the patient to seek immediate medical care, and risk reduction measures to take.  Clemon Chambers, RN, MSN, FNP-C [facility] Phone: [phone]  Clinic MD: Trula Slade  06/13/16 11:10 AM

## 2016-06-13 NOTE — Patient Instructions (Signed)
Peripheral Vascular Disease Peripheral vascular disease (PVD) is a disease of the blood vessels that are not part of your heart and brain. A simple term for PVD is poor circulation. In most cases, PVD narrows the blood vessels that carry blood from your heart to the rest of your body. This can result in a decreased supply of blood to your arms, legs, and internal organs, like your stomach or kidneys. However, it most often affects a person's lower legs and feet. There are two types of PVD.  Organic PVD. This is the more common type. It is caused by damage to the structure of blood vessels.  Functional PVD. This is caused by conditions that make blood vessels contract and tighten (spasm). Without treatment, PVD tends to get worse over time. PVD can also lead to acute ischemic limb. This is when an arm or limb suddenly has trouble getting enough blood. This is a medical emergency. CAUSES Each type of PVD has many different causes. The most common cause of PVD is buildup of a fatty material (plaque) inside of your arteries (atherosclerosis). Small amounts of plaque can break off from the walls of the blood vessels and become lodged in a smaller artery. This blocks blood flow and can cause acute ischemic limb. Other common causes of PVD include:  Blood clots that form inside of blood vessels.  Injuries to blood vessels.  Diseases that cause inflammation of blood vessels or cause blood vessel spasms.  Health behaviors and health history that increase your risk of developing PVD. RISK FACTORS  You may have a greater risk of PVD if you:  Have a family history of PVD.  Have certain medical conditions, including:  High cholesterol.  Diabetes.  High blood pressure (hypertension).  Coronary heart disease.  Past problems with blood clots.  Past injury, such as burns or a broken bone. These may have damaged blood vessels in your limbs.  Buerger disease. This is caused by inflamed blood  vessels in your hands and feet.  Some forms of arthritis.  Rare birth defects that affect the arteries in your legs.  Use tobacco.  Do not get enough exercise.  Are obese.  Are age 50 or older. SIGNS AND SYMPTOMS  PVD may cause many different symptoms. Your symptoms depend on what part of your body is not getting enough blood. Some common signs and symptoms include:  Cramps in your lower legs. This may be a symptom of poor leg circulation (claudication).  Pain and weakness in your legs while you are physically active that goes away when you rest (intermittent claudication).  Leg pain when at rest.  Leg numbness, tingling, or weakness.  Coldness in a leg or foot, especially when compared with the other leg.  Skin or hair changes. These can include:  Hair loss.  Shiny skin.  Pale or bluish skin.  Thick toenails.  Inability to get or maintain an erection (erectile dysfunction). People with PVD are more prone to developing ulcers and sores on their toes, feet, or legs. These may take longer than normal to heal. DIAGNOSIS Your health care provider may diagnose PVD from your signs and symptoms. The health care provider will also do a physical exam. You may have tests to find out what is causing your PVD and determine its severity. Tests may include:  Blood pressure recordings from your arms and legs and measurements of the strength of your pulses (pulse volume recordings).  Imaging studies using sound waves to take pictures of   the blood flow through your blood vessels (Doppler ultrasound).  Injecting a dye into your blood vessels before having imaging studies using:  X-rays (angiogram or arteriogram).  Computer-generated X-rays (CT angiogram).  A powerful electromagnetic field and a computer (magnetic resonance angiogram or MRA). TREATMENT Treatment for PVD depends on the cause of your condition and the severity of your symptoms. It also depends on your age. Underlying  causes need to be treated and controlled. These include long-lasting (chronic) conditions, such as diabetes, high cholesterol, and high blood pressure. You may need to first try making lifestyle changes and taking medicines. Surgery may be needed if these do not work. Lifestyle changes may include:  Quitting smoking.  Exercising regularly.  Following a low-fat, low-cholesterol diet. Medicines may include:  Blood thinners to prevent blood clots.  Medicines to improve blood flow.  Medicines to improve your blood cholesterol levels. Surgical procedures may include:  A procedure that uses an inflated balloon to open a blocked artery and improve blood flow (angioplasty).  A procedure to put in a tube (stent) to keep a blocked artery open (stent implant).  Surgery to reroute blood flow around a blocked artery (peripheral bypass surgery).  Surgery to remove dead tissue from an infected wound on the affected limb.  Amputation. This is surgical removal of the affected limb. This may be necessary in cases of acute ischemic limb that are not improved through medical or surgical treatments. HOME CARE INSTRUCTIONS  Take medicines only as directed by your health care provider.  Do not use any tobacco products, including cigarettes, chewing tobacco, or electronic cigarettes. If you need help quitting, ask your health care provider.  Lose weight if you are overweight, and maintain a healthy weight as directed by your health care provider.  Eat a diet that is low in fat and cholesterol. If you need help, ask your health care provider.  Exercise regularly. Ask your health care provider to suggest some good activities for you.  Use compression stockings or other mechanical devices as directed by your health care provider.  Take good care of your feet.  Wear comfortable shoes that fit well.  Check your feet often for any cuts or sores. SEEK MEDICAL CARE IF:  You have cramps in your legs  while walking.  You have leg pain when you are at rest.  You have coldness in a leg or foot.  Your skin changes.  You have erectile dysfunction.  You have cuts or sores on your feet that are not healing. SEEK IMMEDIATE MEDICAL CARE IF:  Your arm or leg turns cold and blue.  Your arms or legs become red, warm, swollen, painful, or numb.  You have chest pain or trouble breathing.  You suddenly have weakness in your face, arm, or leg.  You become very confused or lose the ability to speak.  You suddenly have a very bad headache or lose your vision.   This information is not intended to replace advice given to you by your health care provider. Make sure you discuss any questions you have with your health care provider.   Document Released: 12/15/2004 Document Revised: 11/28/2014 Document Reviewed: 04/17/2014 Elsevier Interactive Patient Education 2016 Elsevier Inc[employer]     Steps to Quit Smoking  Smoking tobacco can be harmful to your health and can affect almost every organ in your body. Smoking puts you, and those around you, at risk for developing many serious chronic diseases. Quitting smoking is difficult, but it is one of   the best things that you can do for your health. It is never too late to quit. WHAT ARE THE BENEFITS OF QUITTING SMOKING? When you quit smoking, you lower your risk of developing serious diseases and conditions, such as:  Lung cancer or lung disease, such as COPD.  Heart disease.  Stroke.  Heart attack.  Infertility.  Osteoporosis and bone fractures. Additionally, symptoms such as coughing, wheezing, and shortness of breath may get better when you quit. You may also find that you get sick less often because your body is stronger at fighting off colds and infections. If you are pregnant, quitting smoking can help to reduce your chances of having a baby of low birth weight. HOW DO I GET READY TO QUIT? When you decide to quit smoking, create a plan to  make sure that you are successful. Before you quit:  Pick a date to quit. Set a date within the next two weeks to give you time to prepare.  Write down the reasons why you are quitting. Keep this list in places where you will see it often, such as on your bathroom mirror or in your car or wallet.  Identify the people, places, things, and activities that make you want to smoke (triggers) and avoid them. Make sure to take these actions:  Throw away all cigarettes at home, at work, and in your car.  Throw away smoking accessories, such as ashtrays and lighters.  Clean your car and make sure to empty the ashtray.  Clean your home, including curtains and carpets.  Tell your family, friends, and coworkers that you are quitting. Support from your loved ones can make quitting easier.  Talk with your health care provider about your options for quitting smoking.  Find out what treatment options are covered by your health insurance. WHAT STRATEGIES CAN I USE TO QUIT SMOKING?  Talk with your healthcare provider about different strategies to quit smoking. Some strategies include:  Quitting smoking altogether instead of gradually lessening how much you smoke over a period of time. Research shows that quitting "cold turkey" is more successful than gradually quitting.  Attending in-person counseling to help you build problem-solving skills. You are more likely to have success in quitting if you attend several counseling sessions. Even short sessions of 10 minutes can be effective.  Finding resources and support systems that can help you to quit smoking and remain smoke-free after you quit. These resources are most helpful when you use them often. They can include:  Online chats with a counselor.  Telephone quitlines.  Printed self-help materials.  Support groups or group counseling.  Text messaging programs.  Mobile phone applications.  Taking medicines to help you quit smoking. (If you are  pregnant or breastfeeding, talk with your health care provider first.) Some medicines contain nicotine and some do not. Both types of medicines help with cravings, but the medicines that include nicotine help to relieve withdrawal symptoms. Your health care provider may recommend:  Nicotine patches, gum, or lozenges.  Nicotine inhalers or sprays.  Non-nicotine medicine that is taken by mouth. Talk with your health care provider about combining strategies, such as taking medicines while you are also receiving in-person counseling. Using these two strategies together makes you more likely to succeed in quitting than if you used either strategy on its own. If you are pregnant or breastfeeding, talk with your health care provider about finding counseling or other support strategies to quit smoking. Do not take medicine to help you   quit smoking unless told to do so by your health care provider. WHAT THINGS CAN I DO TO MAKE IT EASIER TO QUIT? Quitting smoking might feel overwhelming at first, but there is a lot that you can do to make it easier. Take these important actions:  Reach out to your family and friends and ask that they support and encourage you during this time. Call telephone quitlines, reach out to support groups, or work with a counselor for support.  Ask people who smoke to avoid smoking around you.  Avoid places that trigger you to smoke, such as bars, parties, or smoke-break areas at work.  Spend time around people who do not smoke.  Lessen stress in your life, because stress can be a smoking trigger for some people. To lessen stress, try:  Exercising regularly.  Deep-breathing exercises.  Yoga.  Meditating.  Performing a body scan. This involves closing your eyes, scanning your body from head to toe, and noticing which parts of your body are particularly tense. Purposefully relax the muscles in those areas.  Download or purchase mobile phone or tablet apps (applications)  that can help you stick to your quit plan by providing reminders, tips, and encouragement. There are many free apps, such as QuitGuide from the CDC (Centers for Disease Control and Prevention). You can find other support for quitting smoking (smoking cessation) through smokefree.gov and other websites. HOW WILL I FEEL WHEN I QUIT SMOKING? Within the first 24 hours of quitting smoking, you may start to feel some withdrawal symptoms. These symptoms are usually most noticeable 2-3 days after quitting, but they usually do not last beyond 2-3 weeks. Changes or symptoms that you might experience include:  Mood swings.  Restlessness, anxiety, or irritation.  Difficulty concentrating.  Dizziness.  Strong cravings for sugary foods in addition to nicotine.  Mild weight gain.  Constipation.  Nausea.  Coughing or a sore throat.  Changes in how your medicines work in your body.  A depressed mood.  Difficulty sleeping (insomnia). After the first 2-3 weeks of quitting, you may start to notice more positive results, such as:  Improved sense of smell and taste.  Decreased coughing and sore throat.  Slower heart rate.  Lower blood pressure.  Clearer skin.  The ability to breathe more easily.  Fewer sick days. Quitting smoking is very challenging for most people. Do not get discouraged if you are not successful the first time. Some people need to make many attempts to quit before they achieve long-term success. Do your best to stick to your quit plan, and talk with your health care provider if you have any questions or concerns.   This information is not intended to replace advice given to you by your health care provider. Make sure you discuss any questions you have with your health care provider.   Document Released: 11/01/2001 Document Revised: 03/24/2015 Document Reviewed: 03/24/2015 Elsevier Interactive Patient Education 2016 Elsevier Inc.  

## 2016-06-28 DIAGNOSIS — M545 Low back pain: Secondary | ICD-10-CM | POA: Diagnosis not present

## 2016-06-28 DIAGNOSIS — E119 Type 2 diabetes mellitus without complications: Secondary | ICD-10-CM | POA: Diagnosis not present

## 2016-06-28 DIAGNOSIS — E785 Hyperlipidemia, unspecified: Secondary | ICD-10-CM | POA: Diagnosis not present

## 2016-07-06 DIAGNOSIS — M545 Low back pain: Secondary | ICD-10-CM | POA: Diagnosis not present

## 2016-07-15 DIAGNOSIS — M545 Low back pain: Secondary | ICD-10-CM | POA: Diagnosis not present

## 2016-07-22 DIAGNOSIS — M545 Low back pain: Secondary | ICD-10-CM | POA: Diagnosis not present

## 2016-07-27 NOTE — Addendum Note (Signed)
Addended by: Kaleen Mask on: 07/27/2016 01:03 PM   Modules accepted: Orders

## 2016-07-29 DIAGNOSIS — M545 Low back pain: Secondary | ICD-10-CM | POA: Diagnosis not present

## 2016-08-05 DIAGNOSIS — M545 Low back pain: Secondary | ICD-10-CM | POA: Diagnosis not present

## 2016-08-10 DIAGNOSIS — M545 Low back pain: Secondary | ICD-10-CM | POA: Diagnosis not present

## 2016-08-19 DIAGNOSIS — M545 Low back pain: Secondary | ICD-10-CM | POA: Diagnosis not present

## 2016-09-02 DIAGNOSIS — M545 Low back pain: Secondary | ICD-10-CM | POA: Diagnosis not present

## 2016-09-07 DIAGNOSIS — M545 Low back pain: Secondary | ICD-10-CM | POA: Diagnosis not present

## 2016-09-16 DIAGNOSIS — M545 Low back pain: Secondary | ICD-10-CM | POA: Diagnosis not present

## 2016-09-23 DIAGNOSIS — M545 Low back pain: Secondary | ICD-10-CM | POA: Diagnosis not present

## 2016-09-28 DIAGNOSIS — I10 Essential (primary) hypertension: Secondary | ICD-10-CM | POA: Diagnosis not present

## 2016-09-28 DIAGNOSIS — E785 Hyperlipidemia, unspecified: Secondary | ICD-10-CM | POA: Diagnosis not present

## 2016-09-28 DIAGNOSIS — M545 Low back pain: Secondary | ICD-10-CM | POA: Diagnosis not present

## 2016-09-28 DIAGNOSIS — E119 Type 2 diabetes mellitus without complications: Secondary | ICD-10-CM | POA: Diagnosis not present

## 2016-10-06 ENCOUNTER — Other Ambulatory Visit: Payer: Self-pay | Admitting: *Deleted

## 2016-10-06 DIAGNOSIS — I739 Peripheral vascular disease, unspecified: Secondary | ICD-10-CM

## 2016-10-28 DIAGNOSIS — E118 Type 2 diabetes mellitus with unspecified complications: Secondary | ICD-10-CM | POA: Diagnosis not present

## 2016-10-28 DIAGNOSIS — I10 Essential (primary) hypertension: Secondary | ICD-10-CM | POA: Diagnosis not present

## 2016-10-28 DIAGNOSIS — M8580 Other specified disorders of bone density and structure, unspecified site: Secondary | ICD-10-CM | POA: Diagnosis not present

## 2016-10-28 DIAGNOSIS — E669 Obesity, unspecified: Secondary | ICD-10-CM | POA: Diagnosis not present

## 2016-12-13 ENCOUNTER — Encounter: Payer: Self-pay | Admitting: Family

## 2016-12-16 ENCOUNTER — Ambulatory Visit (HOSPITAL_COMMUNITY)
Admission: RE | Admit: 2016-12-16 | Discharge: 2016-12-16 | Disposition: A | Discharge status: Home / Self Care | Payer: Commercial Managed Care - HMO | Source: Ambulatory Visit | Attending: Family | Admitting: Family

## 2016-12-16 ENCOUNTER — Ambulatory Visit (INDEPENDENT_AMBULATORY_CARE_PROVIDER_SITE_OTHER)
Admission: RE | Admit: 2016-12-16 | Discharge: 2016-12-16 | Disposition: A | Discharge status: Home / Self Care | Payer: Commercial Managed Care - HMO | Source: Ambulatory Visit | Attending: Family | Admitting: Family

## 2016-12-16 ENCOUNTER — Encounter: Payer: Self-pay | Admitting: Family

## 2016-12-16 ENCOUNTER — Ambulatory Visit (INDEPENDENT_AMBULATORY_CARE_PROVIDER_SITE_OTHER): Payer: Commercial Managed Care - HMO | Admitting: Family

## 2016-12-16 VITALS — BP 221/110 | HR 75 | Temp 97.6°F | Resp 20 | Ht 68.0 in | Wt 264.1 lb

## 2016-12-16 DIAGNOSIS — R0989 Other specified symptoms and signs involving the circulatory and respiratory systems: Secondary | ICD-10-CM | POA: Diagnosis present

## 2016-12-16 DIAGNOSIS — Z95828 Presence of other vascular implants and grafts: Secondary | ICD-10-CM | POA: Diagnosis not present

## 2016-12-16 DIAGNOSIS — I779 Disorder of arteries and arterioles, unspecified: Secondary | ICD-10-CM

## 2016-12-16 DIAGNOSIS — F17200 Nicotine dependence, unspecified, uncomplicated: Secondary | ICD-10-CM

## 2016-12-16 DIAGNOSIS — E1151 Type 2 diabetes mellitus with diabetic peripheral angiopathy without gangrene: Secondary | ICD-10-CM

## 2016-12-16 DIAGNOSIS — I739 Peripheral vascular disease, unspecified: Secondary | ICD-10-CM | POA: Diagnosis not present

## 2016-12-16 DIAGNOSIS — I10 Essential (primary) hypertension: Secondary | ICD-10-CM

## 2016-12-16 NOTE — Progress Notes (Signed)
Vitals:   12/16/16 1220 12/16/16 1231  BP: (!) 194/87 (!) 221/110  Pulse: 75 75  Resp: 20   Temp: 97.6 F (36.4 C)   TempSrc: Oral   SpO2: 98%   Weight: 264 lb 1.6 oz (119.8 kg)   Height: 5\' 8"  (1.727 m)    Reported sister passed away last week; hasn't taken any BP medication today.

## 2016-12-16 NOTE — Progress Notes (Signed)
[facility] [place]   CC: Follow up peripheral artery occlusive disease  History of Present Illness Alicia [name] is a 66 y.o. female patient of Dr. Bridgett Larsson who is status post left common femoral artery to popliteal bypass graft in April 2012. She returns today for routine surveillance. She has pain in her low back and a weak feelking in both thighs and calves after walking about 1/2 block, worse in the left leg; she is not sure if the weakness resolves with rest, denies non healing wounds. She cannot remember if she had her lumbar spine evaluated, states she has generalized arthritis.   She reports pain in posterior thighs, left worse than right, in the morning, improves as the day progresses.   Does water aerobics about 5 days/week. She denies any history of stroke, TIA, or MI.   She reports slight headache now, and dyspnea on exertion, denies chest pain.  The patient denies New Medical or Surgical History.  Pt Diabetic: Yes, states her A1C improved to 7.? Pt smoker: smoker (1/2 ppd, started in her 20's)  Pt meds include: Statin :No, this caused worse weakness in her legs ASA: Not taking lately, advised to resume Other anticoagulants/antiplatelets: no    Past Medical History:  Diagnosis Date  . Arthritis   . Chronic headaches   . Constipation   . Depression   . Diabetes mellitus   . Hyperlipidemia   . Hypertension   . Obesity   . PAD (peripheral artery disease) ([place])   . Shortness of breath     Social History Social History  Substance Use Topics  . Smoking status: Light Tobacco Smoker    Packs/day: 0.50    Years: 35.00    Types: Cigarettes  . Smokeless tobacco: Never Used     Comment: patient is trying to quit and has gone down to half a pack per day  . Alcohol use 0.5 oz/week    1 Standard drinks or equivalent per week     Comment: occasional use only    Family History Family History  Problem Relation Age of Onset  .  Diabetes Father   . Heart disease Mother     NOT before age 15-  Bypass  . Hypertension Mother   . Hypertension Sister   . Varicose Veins Brother   . Heart disease Brother     Before age 38  . Hypertension Daughter     Past Surgical History:  Procedure Laterality Date  . FEMORAL BYPASS  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 Bridgett Larsson    No Known Allergies  Current Outpatient Prescriptions  Medication Sig Dispense Refill  . aspirin 81 MG tablet Take 81 mg by mouth daily.      . enalapril (VASOTEC) 20 MG tablet 2 (two) times daily.    . famotidine (PEPCID) 20 MG tablet Take 20 mg by mouth 2 (two) times daily.     Marland Kitchen ibuprofen (ADVIL,MOTRIN) 800 MG tablet Take 1 tablet (800 mg total) by mouth 3 (three) times daily. 21 tablet 0  . metFORMIN (GLUCOPHAGE) 1000 MG tablet Take 1,000 mg by mouth 2 (two) times daily with a meal.      . NIFEdipine (PROCARDIA-XL/ADALAT CC) 60 MG 24 hr tablet 2 (two) times daily.    . cyclobenzaprine (FLEXERIL) 10 MG tablet Take 1 tablet (10 mg total) by mouth 2 (two) times daily as needed for muscle spasms. (Patient not taking: Reported on  06/13/2016) 20 tablet 0  . diazepam (VALIUM) 5 MG tablet Take 1 tablet (5 mg total) by mouth 2 (two) times daily. (Patient not taking: Reported on 06/13/2016) 20 tablet 0  . diclofenac (VOLTAREN) 50 MG EC tablet Take 50 mg by mouth 3 (three) times daily.    . empagliflozin (JARDIANCE) 10 MG TABS tablet Take 10 mg by mouth daily.    . metroNIDAZOLE (FLAGYL) 250 MG tablet Take 1 tablet (250 mg total) by mouth 3 (three) times daily. (Patient not taking: Reported on 06/13/2016) 21 tablet 0  . nystatin (MYCOSTATIN/NYSTOP) 100000 UNIT/GM POWD Apply to dry skin folds twice daily (Patient not taking: Reported on 06/13/2016) 60 g 1  . nystatin cream (MYCOSTATIN) Apply topically 2 (two) times daily. (Patient not taking: Reported on 12/16/2016) 30 g 1  . Olmesartan-Amlodipine-HCTZ (TRIBENZOR) 40-5-25 MG TABS Take 1  tablet by mouth daily.    Marland Kitchen oxyCODONE-acetaminophen (PERCOCET) 5-325 MG per tablet Take 2 tablets by mouth every 4 (four) hours as needed for pain. (Patient not taking: Reported on 06/12/2015) 15 tablet 0  . oxyCODONE-acetaminophen (PERCOCET/ROXICET) 5-325 MG per tablet Take 2 tablets by mouth every 6 (six) hours as needed for pain. (Patient not taking: Reported on 06/12/2015) 15 tablet 0  . oxyCODONE-acetaminophen (PERCOCET/ROXICET) 5-325 MG per tablet Take 1 tablet by mouth every 6 (six) hours as needed for pain. (Patient not taking: Reported on 06/12/2015) 20 tablet 0  . predniSONE (DELTASONE) 20 MG tablet 3 tabs po daily x 3 days, then 2 tabs x 3 days, then 1.5 tabs x 3 days, then 1 tab x 3 days, then 0.5 tabs x 3 days (Patient not taking: Reported on 06/13/2016) 27 tablet 0  . rosuvastatin (CRESTOR) 5 MG tablet Take 5 mg by mouth daily.       No current facility-administered medications for this visit.     ROS: See HPI for pertinent positives and negatives.   Physical Examination  Vitals:   12/16/16 1220 12/16/16 1231  BP: (!) 194/87 (!) 221/110  Pulse: 75 75  Resp: 20   Temp: 97.6 F (36.4 C)   TempSrc: Oral   SpO2: 98%   Weight: 264 lb 1.6 oz (119.8 kg)   Height: 5\' 8"  (1.727 m)    Body mass index is 40.16 kg/m.  General: A&O x 3, WDWN, morbidly obese female. Gait: normal Eyes: PERRLA. Pulmonary: Respirations are non labored, distant breath sounds in all fields, without wheezes, rales, or rhonchi. Cardiac: Irregular rhythm, no detected murmur.     Carotid Bruits Left Right   Negative Negative  Aorta is not palpable. Radial pulses: are 3+ palpable and =   VASCULAR EXAM: Extremitieswithout ischemic changes  without Gangrene; without open wounds.     LE Pulses LEFT RIGHT   FEMORAL   palpable  palpable    POPLITEAL not palpable  not palpable   POSTERIOR TIBIAL 2+ palpable  not palpable    DORSALIS PEDIS  ANTERIOR TIBIAL not palpable  not palpable    Abdomen: soft, obese, NT, no palpable masses. Skin: no rashes, no ulcers. Musculoskeletal: no muscle wasting or atrophy. Neurologic: A&O X 3; Appropriate Affect, MOTOR FUNCTION: moving all extremities equally, motor strength 5/5 throughout. Speech is fluent/normal. CN 2-12 intact     ASSESSMENT: NALEAH KOFOED is a 66 y.o. female whois status post left common femoral artery to popliteal bypass graft in April 2012. She has weakness in both legs with standing or walking, worse in the left leg.  The  pain in her thighs and low back is not likely related to arterial occlusive disease, but is consistent with radiculopathy. Her atherosclerotic risk factors include continued smoking, almost in control DM, uncontrolled hypertension, and obesity.   She has been taking ibuprofen which helps her low back, hips, and posterior thigh pain, which she has noticed also increases her blood pressure, see Plan.  DATA Left LE arterial duplex today demonstrates left LE arterial bypass graft with elevated velocities in the proximal anastomosis (50-70%) and native inflow artery (50-74%). Inflow disease progression since the last exam on 06-13-16.   ABI's today: Right: 0.55 (was 0.60 on 06-13-16), monophasic PT, biphasic DP, TBI: 0.42 (was 0.53) Left: 0.87 (was 0.90), triphasic PT, biphasic DP; TBI: 0.57 ( was 0.83). Slight decline in bilateral ABI and right TBI; significant decline in left TBI.      PLAN:  Resume daily 81 mg ASA, stop the ibuprofen, may take acetaminophen for pain in her low back and legs that is c/w radiculopathy pain.  I advised pt to go to her PCP's office now to evaluate and advise re her uncontrolled hypertension and irregular cardiac rhythm.  Based on the  patient's vascular studies and examination, and after discussing with Dr. Bridgett Larsson, pt will return to clinic in 3 months with bilateral aortoiliac duplex and ABI's, see me afterward; will check left LE arterial duplex a a later date.   The patient was counseled re smoking cessation and given several free resources re smoking cessation.  I discussed in depth with the patient the nature of atherosclerosis, and emphasized the importance of maximal medical management including strict control of blood pressure, blood glucose, and lipid levels, obtaining regular exercise, and cessation of smoking.  The patient is aware that without maximal medical management the underlying atherosclerotic disease process will progress, limiting the benefit of any interventions.  The patient was given information about PAD including signs, symptoms, treatment, what symptoms should prompt the patient to seek immediate medical care, and risk reduction measures to take.  Clemon Chambers, RN, MSN, FNP-C [facility] Phone: [phone]  Clinic MD: Chen/Brabham  12/16/16 12:43 PM

## 2016-12-16 NOTE — Patient Instructions (Signed)

## 2016-12-20 NOTE — Addendum Note (Signed)
Addended by: Lianne Cure A on: 12/20/2016 03:29 PM   Modules accepted: Orders

## 2017-01-19 ENCOUNTER — Encounter (HOSPITAL_COMMUNITY): Payer: Self-pay

## 2017-01-19 ENCOUNTER — Emergency Department (HOSPITAL_COMMUNITY)
Admission: EM | Admit: 2017-01-19 | Discharge: 2017-01-19 | Disposition: A | Discharge status: Home / Self Care | Payer: Medicare HMO | Attending: Emergency Medicine | Admitting: Emergency Medicine

## 2017-01-19 DIAGNOSIS — F17210 Nicotine dependence, cigarettes, uncomplicated: Secondary | ICD-10-CM | POA: Diagnosis not present

## 2017-01-19 DIAGNOSIS — E119 Type 2 diabetes mellitus without complications: Secondary | ICD-10-CM | POA: Insufficient documentation

## 2017-01-19 DIAGNOSIS — M79605 Pain in left leg: Secondary | ICD-10-CM | POA: Diagnosis not present

## 2017-01-19 DIAGNOSIS — G8929 Other chronic pain: Secondary | ICD-10-CM

## 2017-01-19 DIAGNOSIS — M79604 Pain in right leg: Secondary | ICD-10-CM | POA: Diagnosis not present

## 2017-01-19 DIAGNOSIS — Z7984 Long term (current) use of oral hypoglycemic drugs: Secondary | ICD-10-CM | POA: Diagnosis not present

## 2017-01-19 DIAGNOSIS — L0201 Cutaneous abscess of face: Secondary | ICD-10-CM | POA: Diagnosis not present

## 2017-01-19 DIAGNOSIS — I10 Essential (primary) hypertension: Secondary | ICD-10-CM | POA: Insufficient documentation

## 2017-01-19 DIAGNOSIS — Z7982 Long term (current) use of aspirin: Secondary | ICD-10-CM | POA: Diagnosis not present

## 2017-01-19 DIAGNOSIS — Z79899 Other long term (current) drug therapy: Secondary | ICD-10-CM | POA: Insufficient documentation

## 2017-01-19 LAB — CBC WITH DIFFERENTIAL/PLATELET
BASOS ABS: 0 10*3/uL (ref 0.0–0.1)
Basophils Relative: 0 %
Eosinophils Absolute: 0.2 10*3/uL (ref 0.0–0.7)
Eosinophils Relative: 2 %
HEMATOCRIT: 41.2 % (ref 36.0–46.0)
HEMOGLOBIN: 13.7 g/dL (ref 12.0–15.0)
LYMPHS PCT: 30 %
Lymphs Abs: 2 10*3/uL (ref 0.7–4.0)
MCH: 30 pg (ref 26.0–34.0)
MCHC: 33.3 g/dL (ref 30.0–36.0)
MCV: 90.2 fL (ref 78.0–100.0)
MONO ABS: 0.4 10*3/uL (ref 0.1–1.0)
Monocytes Relative: 6 %
NEUTROS ABS: 4 10*3/uL (ref 1.7–7.7)
NEUTROS PCT: 62 %
Platelets: 255 10*3/uL (ref 150–400)
RBC: 4.57 MIL/uL (ref 3.87–5.11)
RDW: 13.9 % (ref 11.5–15.5)
WBC: 6.7 10*3/uL (ref 4.0–10.5)

## 2017-01-19 LAB — COMPREHENSIVE METABOLIC PANEL
ALBUMIN: 3.4 g/dL — AB (ref 3.5–5.0)
ALT: 15 U/L (ref 14–54)
ANION GAP: 9 (ref 5–15)
AST: 17 U/L (ref 15–41)
Alkaline Phosphatase: 107 U/L (ref 38–126)
BILIRUBIN TOTAL: 0.6 mg/dL (ref 0.3–1.2)
BUN: 14 mg/dL (ref 6–20)
CO2: 25 mmol/L (ref 22–32)
Calcium: 8.8 mg/dL — ABNORMAL LOW (ref 8.9–10.3)
Chloride: 104 mmol/L (ref 101–111)
Creatinine, Ser: 1.08 mg/dL — ABNORMAL HIGH (ref 0.44–1.00)
GFR calc Af Amer: >60 mL/min (ref >60)
GFR calc non Af Amer: 52 mL/min — ABNORMAL LOW (ref >60)
GLUCOSE: 214 mg/dL — AB (ref 65–99)
POTASSIUM: 4 mmol/L (ref 3.5–5.1)
SODIUM: 138 mmol/L (ref 135–145)
TOTAL PROTEIN: 6.8 g/dL (ref 6.5–8.1)

## 2017-01-19 MED ORDER — OLMESARTAN-AMLODIPINE-HCTZ 40-5-25 MG PO TABS: 1.0000 | ORAL_TABLET | Freq: Once | ORAL | Status: DC

## 2017-01-19 MED ORDER — LIDOCAINE HCL (PF) 1 % IJ SOLN
5.0000 mL | Freq: Once | INTRAMUSCULAR | Status: AC
  Administered 2017-01-19: 5 mL
  Filled 2017-01-19: qty 5

## 2017-01-19 MED ORDER — HYDROCHLOROTHIAZIDE 25 MG PO TABS
25.0000 mg | ORAL_TABLET | Freq: Once | ORAL | Status: AC
  Administered 2017-01-19: 25 mg via ORAL
  Filled 2017-01-19: qty 1

## 2017-01-19 MED ORDER — NIFEDIPINE ER 60 MG PO TB24
60.0000 mg | ORAL_TABLET | Freq: Once | ORAL | Status: DC
  Filled 2017-01-19: qty 1

## 2017-01-19 MED ORDER — ENALAPRIL MALEATE 20 MG PO TABS
20.0000 mg | ORAL_TABLET | Freq: Once | ORAL | Status: DC
  Filled 2017-01-19: qty 1

## 2017-01-19 MED ORDER — SULFAMETHOXAZOLE-TRIMETHOPRIM 800-160 MG PO TABS: 1.0000 | ORAL_TABLET | Freq: Two times a day (BID) | ORAL | 0 refills | Status: AC

## 2017-01-19 MED ORDER — IRBESARTAN 300 MG PO TABS
300.0000 mg | ORAL_TABLET | Freq: Once | ORAL | Status: DC
  Filled 2017-01-19: qty 1

## 2017-01-19 MED ORDER — AMLODIPINE BESYLATE 5 MG PO TABS
5.0000 mg | ORAL_TABLET | Freq: Once | ORAL | Status: AC
  Administered 2017-01-19: 5 mg via ORAL
  Filled 2017-01-19: qty 1

## 2017-01-19 NOTE — ED Provider Notes (Signed)
[facility] DEPT Provider Note   CSN: [id number] Arrival date & time: 01/19/17  1154     History   Chief Complaint Chief Complaint  Patient presents with  . Leg Pain  . Facial Swelling    HPI Alicia [name] is a 66 y.o. female.  The history is provided by the patient.  Abscess  Location:  Face Facial abscess location:  R cheek Size:  2 cm Abscess quality: induration, painful and redness   Duration:  2 days Progression:  Worsening Pain details:    Quality:  Dull, hot and burning   Severity:  Moderate   Timing:  Constant   Progression:  Unchanged Chronicity:  New Context: diabetes   Relieved by:  Nothing Worsened by:  Draining/squeezing Ineffective treatments:  Draining/squeezing Associated symptoms: no fever     Past Medical History:  Diagnosis Date  . Arthritis   . Chronic headaches   . Constipation   . Depression   . Diabetes mellitus   . Hyperlipidemia   . Hypertension   . Obesity   . PAD (peripheral artery disease) ([place])   . Shortness of breath     Patient Active Problem List   Diagnosis Date Noted  . Numbness in both hands- and Feet 06/06/2014  . Weakness of both legs 06/06/2014  . Swelling of limb-Left Leg > Right 06/06/2014  . Peripheral vascular disease, unspecified 11/30/2012  . Pain in limb 11/30/2012  . Atherosclerosis of native arteries of the extremities with intermittent claudication 04/06/2012  . CHEST PAIN 01/03/2011  . DYSLIPIDEMIA 07/20/2007  . HYPERTENSION 07/20/2007  . ARTHRITIS 07/20/2007    Past Surgical History:  Procedure Laterality Date  . FEMORAL BYPASS  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 Bridgett Larsson    OB History    No data available       Home Medications    Prior to Admission medications   Medication Sig Start Date End Date Taking? Authorizing Provider  aspirin 81 MG tablet Take 81 mg by mouth daily.      Historical Provider, MD  cyclobenzaprine (FLEXERIL) 10 MG tablet  Take 1 tablet (10 mg total) by mouth 2 (two) times daily as needed for muscle spasms. Patient not taking: Reported on 06/13/2016 02/02/13   Elyn Peers, MD  diazepam (VALIUM) 5 MG tablet Take 1 tablet (5 mg total) by mouth 2 (two) times daily. Patient not taking: Reported on 06/13/2016 07/29/13   Carvel Getting, NP  diclofenac (VOLTAREN) 50 MG EC tablet Take 50 mg by mouth 3 (three) times daily.    Historical Provider, MD  empagliflozin (JARDIANCE) 10 MG TABS tablet Take 10 mg by mouth daily.    Historical Provider, MD  enalapril (VASOTEC) 20 MG tablet 2 (two) times daily. 05/24/14   Historical Provider, MD  famotidine (PEPCID) 20 MG tablet Take 20 mg by mouth 2 (two) times daily.     Historical Provider, MD  ibuprofen (ADVIL,MOTRIN) 800 MG tablet Take 1 tablet (800 mg total) by mouth 3 (three) times daily. 07/29/13   Carvel Getting, NP  metFORMIN (GLUCOPHAGE) 1000 MG tablet Take 1,000 mg by mouth 2 (two) times daily with a meal.      Historical Provider, MD  metroNIDAZOLE (FLAGYL) 250 MG tablet Take 1 tablet (250 mg total) by mouth 3 (three) times daily. Patient not taking: Reported on 06/13/2016 07/31/13   Billy Fischer, MD  NIFEdipine (PROCARDIA-XL/ADALAT CC) 60 MG 24 hr tablet 2 (  two) times daily. 03/15/14   Historical Provider, MD  nystatin (MYCOSTATIN/NYSTOP) 100000 UNIT/GM POWD Apply to dry skin folds twice daily Patient not taking: Reported on 06/13/2016 07/29/13   Carvel Getting, NP  nystatin cream (MYCOSTATIN) Apply topically 2 (two) times daily. Patient not taking: Reported on 12/16/2016 07/29/13   Carvel Getting, NP  Olmesartan-Amlodipine-HCTZ (TRIBENZOR) 40-5-25 MG TABS Take 1 tablet by mouth daily.    Historical Provider, MD  oxyCODONE-acetaminophen (PERCOCET) 5-325 MG per tablet Take 2 tablets by mouth every 4 (four) hours as needed for pain. Patient not taking: Reported on 06/12/2015 02/02/13   Elyn Peers, MD  oxyCODONE-acetaminophen (PERCOCET/ROXICET) 5-325 MG per tablet Take 2 tablets by mouth  every 6 (six) hours as needed for pain. Patient not taking: Reported on 06/12/2015 02/11/13   Cleatrice Burke, PA-C  oxyCODONE-acetaminophen (PERCOCET/ROXICET) 5-325 MG per tablet Take 1 tablet by mouth every 6 (six) hours as needed for pain. Patient not taking: Reported on 06/12/2015 07/29/13   Carvel Getting, NP  predniSONE (DELTASONE) 20 MG tablet 3 tabs po daily x 3 days, then 2 tabs x 3 days, then 1.5 tabs x 3 days, then 1 tab x 3 days, then 0.5 tabs x 3 days Patient not taking: Reported on 06/13/2016 02/11/13   Cleatrice Burke, PA-C  rosuvastatin (CRESTOR) 5 MG tablet Take 5 mg by mouth daily.      Historical Provider, MD    Family History Family History  Problem Relation Age of Onset  . Diabetes Father   . Heart disease Mother     NOT before age 66-  Bypass  . Hypertension Mother   . Hypertension Sister   . Varicose Veins Brother   . Heart disease Brother     Before age 73  . Hypertension Daughter     Social History Social History  Substance Use Topics  . Smoking status: Light Tobacco Smoker    Packs/day: 0.50    Years: 35.00    Types: Cigarettes  . Smokeless tobacco: Never Used     Comment: patient is trying to quit and has gone down to half a pack per day  . Alcohol use 0.5 oz/week    1 Standard drinks or equivalent per week     Comment: occasional use only     Allergies   Patient has no known allergies.   Review of Systems Review of Systems  Constitutional: Negative for fever.  Musculoskeletal:       Chronic bilateral leg pain and left leg swelling  All other systems reviewed and are negative.    Physical Exam Updated Vital Signs BP (!) 228/116 (BP Location: Left Arm)   Pulse 102   Temp 98.4 F (36.9 C) (Oral)   Resp 18   SpO2 99%   Physical Exam  Constitutional: She is oriented to person, place, and time. She appears well-developed and well-nourished. No distress.  HENT:  Head: Normocephalic.    Nose: Nose normal.  2 cm indurated, firm area with  small central defect. Not draining, tense.  Eyes: Conjunctivae are normal.  Neck: Neck supple. No tracheal deviation present.  Cardiovascular: Normal rate and regular rhythm.   Pulmonary/Chest: Effort normal. No respiratory distress.  Abdominal: Soft. She exhibits no distension.  Musculoskeletal:  Chronic L>R lower extremity edema. 2+ b/l DP pulses  Neurological: She is alert and oriented to person, place, and time.  Skin: Skin is warm and dry.  Psychiatric: She has a normal mood and affect.  ED Treatments / Results  Labs (all labs ordered are listed, but only abnormal results are displayed) Labs Reviewed  COMPREHENSIVE METABOLIC PANEL - Abnormal; Notable for the following:       Result Value   Glucose, Bld 214 (*)    Creatinine, Ser 1.08 (*)    Calcium 8.8 (*)    Albumin 3.4 (*)    GFR calc non Af Amer 52 (*)    All other components within normal limits  CBC WITH DIFFERENTIAL/PLATELET    EKG  EKG Interpretation None       Radiology No results found.  Procedures Procedures (including critical care time)  INCISION AND DRAINAGE Performed by: Leo Grosser Consent: Verbal consent obtained. Risks and benefits: risks, benefits and alternatives were discussed Type: abscess  Body area: right cheek  Anesthesia: local infiltration  Incision was made with a scalpel.  Local anesthetic: lidocaine 1% wo epinephrine  Anesthetic total: 2 ml  Complexity: complex Blunt dissection to break up loculations  Drainage: purulent  Drainage amount: moderate  Packing material: none  Patient tolerance: Patient tolerated the procedure well with no immediate complications.    Medications Ordered in ED Medications - No data to display   Initial Impression / Assessment and Plan / ED Course  I have reviewed the triage vital signs and the nursing notes.  Pertinent labs & imaging results that were available during my care of the patient were reviewed by me and considered  in my medical decision making (see chart for details).     66 year old female presents with chief complaint of right facial swelling. She has chronic bilateral leg pain and chronic hypertension, she did not take her medications today because she was coming to the emergency department for evaluation of her face.  She appears to have a small cutaneous abscess on her right lower cheek. This was incised and drained with return of purulent material and left over induration was noted on the face. Suspect staph and will cover with Bactrim for empiric staph cellulitis coverage in a sensitive area.  She was provided with a dose of her home antihypertensive medications. We discussed leaving the wound open to drain and return precautions for worsening or new concerning symptoms. Labs were drawn from triage and are reassuring for lack of systemic infection or complications of hypertension.  Final Clinical Impressions(s) / ED Diagnoses   Final diagnoses:  Chronic pain of both lower extremities  Acute abscess of face    New Prescriptions New Prescriptions   No medications on file     Leo Grosser, MD 01/19/17 [phone]

## 2017-01-19 NOTE — ED Notes (Signed)
Awaiting bp meds and bp control before dc

## 2017-01-19 NOTE — ED Notes (Signed)
pharmcy verified medication, two are here in the ed and given to patient, the others are still not here from pharmacy after notifying them x 3, the patient refuses to wait for them and states she must get home before it gets dark because she is driving, patient educated about her hypertension and how high it is, patient still insists on leaving and not waiting states she will take her blood pressure medication at home

## 2017-01-19 NOTE — ED Notes (Signed)
Awaiting meds to be verified by pharmacy

## 2017-01-19 NOTE — ED Triage Notes (Signed)
Pt reports bilateral leg pain and weakness. Hx of same. She reports she had bypass surgery on the left leg 5 years ago but she has recently had increased bilateral leg weakness. She also has lower back pain. She also has an abscess to the right cheek.

## 2017-01-19 NOTE — ED Notes (Signed)
Pharmacy notified again by charge nurse, patient updated about still awaiting for medication

## 2017-01-19 NOTE — Discharge Planning (Signed)
Pt up for discharge. EDCM reviewed chart for possible CM needs.  No needs identified or communicated.  

## 2017-01-19 NOTE — ED Notes (Signed)
Pharmacy notified x 2 for bp medication to be verified, pt instructed to me she is ready to go explained the process for waiting for her medication from pharmcy

## 2017-03-15 ENCOUNTER — Encounter: Payer: Self-pay | Admitting: Family

## 2017-03-17 ENCOUNTER — Ambulatory Visit: Payer: Commercial Managed Care - HMO | Admitting: Family

## 2017-03-17 ENCOUNTER — Ambulatory Visit (HOSPITAL_COMMUNITY)
Admission: RE | Admit: 2017-03-17 | Discharge: 2017-03-17 | Disposition: A | Discharge status: Home / Self Care | Payer: Medicare HMO | Source: Ambulatory Visit | Attending: Vascular Surgery | Admitting: Vascular Surgery

## 2017-03-17 ENCOUNTER — Ambulatory Visit (INDEPENDENT_AMBULATORY_CARE_PROVIDER_SITE_OTHER)
Admission: RE | Admit: 2017-03-17 | Discharge: 2017-03-17 | Disposition: A | Discharge status: Home / Self Care | Payer: Medicare HMO | Source: Ambulatory Visit | Attending: Vascular Surgery | Admitting: Vascular Surgery

## 2017-03-17 DIAGNOSIS — I779 Disorder of arteries and arterioles, unspecified: Secondary | ICD-10-CM

## 2017-03-17 DIAGNOSIS — E1151 Type 2 diabetes mellitus with diabetic peripheral angiopathy without gangrene: Secondary | ICD-10-CM | POA: Diagnosis not present

## 2017-03-17 DIAGNOSIS — Z95828 Presence of other vascular implants and grafts: Secondary | ICD-10-CM

## 2017-03-24 ENCOUNTER — Ambulatory Visit: Payer: Medicare HMO | Admitting: Family

## 2017-03-24 ENCOUNTER — Encounter: Payer: Self-pay | Admitting: Family

## 2017-03-24 ENCOUNTER — Ambulatory Visit (INDEPENDENT_AMBULATORY_CARE_PROVIDER_SITE_OTHER): Payer: Medicare HMO | Admitting: Family

## 2017-03-24 VITALS — BP 171/92 | HR 97 | Temp 98.7°F | Resp 18 | Ht 68.0 in | Wt 264.0 lb

## 2017-03-24 DIAGNOSIS — Z95828 Presence of other vascular implants and grafts: Secondary | ICD-10-CM | POA: Diagnosis not present

## 2017-03-24 DIAGNOSIS — F17200 Nicotine dependence, unspecified, uncomplicated: Secondary | ICD-10-CM | POA: Diagnosis not present

## 2017-03-24 DIAGNOSIS — I779 Disorder of arteries and arterioles, unspecified: Secondary | ICD-10-CM

## 2017-03-24 DIAGNOSIS — E1151 Type 2 diabetes mellitus with diabetic peripheral angiopathy without gangrene: Secondary | ICD-10-CM | POA: Diagnosis not present

## 2017-03-24 NOTE — Patient Instructions (Addendum)
Peripheral Vascular Disease Peripheral vascular disease (PVD) is a disease of the blood vessels that are not part of your heart and brain. A simple term for PVD is poor circulation. In most cases, PVD narrows the blood vessels that carry blood from your heart to the rest of your body. This can result in a decreased supply of blood to your arms, legs, and internal organs, like your stomach or kidneys. However, it most often affects a person's lower legs and feet. There are two types of PVD.  Organic PVD. This is the more common type. It is caused by damage to the structure of blood vessels.  Functional PVD. This is caused by conditions that make blood vessels contract and tighten (spasm). Without treatment, PVD tends to get worse over time. PVD can also lead to acute ischemic limb. This is when an arm or limb suddenly has trouble getting enough blood. This is a medical emergency. Follow these instructions at home:  Take medicines only as told by your doctor.  Do not use any tobacco products, including cigarettes, chewing tobacco, or electronic cigarettes. If you need help quitting, ask your doctor.  Lose weight if you are overweight, and maintain a healthy weight as told by your doctor.  Eat a diet that is low in fat and cholesterol. If you need help, ask your doctor.  Exercise regularly. Ask your doctor for some good activities for you.  Take good care of your feet.  Wear comfortable shoes that fit well.  Check your feet often for any cuts or sores. Contact a doctor if:  You have cramps in your legs while walking.  You have leg pain when you are at rest.  You have coldness in a leg or foot.  Your skin changes.  You are unable to get or have an erection (erectile dysfunction).  You have cuts or sores on your feet that are not healing. Get help right away if:  Your arm or leg turns cold and blue.  Your arms or legs become red, warm, swollen, painful, or numb.  You have  chest pain or trouble breathing.  You suddenly have weakness in your face, arm, or leg.  You become very confused or you cannot speak.  You suddenly have a very bad headache.  You suddenly cannot see. This information is not intended to replace advice given to you by your health care provider. Make sure you discuss any questions you have with your health care provider. Document Released: 02/01/2010 Document Revised: 04/14/2016 Document Reviewed: 04/17/2014 Elsevier Interactive Patient Education  2017 [employer]      Steps to Quit Smoking Smoking tobacco can be bad for your health. It can also affect almost every organ in your body. Smoking puts you and people around you at risk for many serious long-lasting (chronic) diseases. Quitting smoking is hard, but it is one of the best things that you can do for your health. It is never too late to quit. What are the benefits of quitting smoking? When you quit smoking, you lower your risk for getting serious diseases and conditions. They can include:  Lung cancer or lung disease.  Heart disease.  Stroke.  Heart attack.  Not being able to have children (infertility).  Weak bones (osteoporosis) and broken bones (fractures). If you have coughing, wheezing, and shortness of breath, those symptoms may get better when you quit. You may also get sick less often. If you are pregnant, quitting smoking can help to lower your chances   of having a baby of low birth weight. What can I do to help me quit smoking? Talk with your doctor about what can help you quit smoking. Some things you can do (strategies) include:  Quitting smoking totally, instead of slowly cutting back how much you smoke over a period of time.  Going to in-person counseling. You are more likely to quit if you go to many counseling sessions.  Using resources and support systems, such as:  Online chats with a counselor.  Phone quitlines.  Printed self-help  materials.  Support groups or group counseling.  Text messaging programs.  Mobile phone apps or applications.  Taking medicines. Some of these medicines may have nicotine in them. If you are pregnant or breastfeeding, do not take any medicines to quit smoking unless your doctor says it is okay. Talk with your doctor about counseling or other things that can help you. Talk with your doctor about using more than one strategy at the same time, such as taking medicines while you are also going to in-person counseling. This can help make quitting easier. What things can I do to make it easier to quit? Quitting smoking might feel very hard at first, but there is a lot that you can do to make it easier. Take these steps:  Talk to your family and friends. Ask them to support and encourage you.  Call phone quitlines, reach out to support groups, or work with a counselor.  Ask people who smoke to not smoke around you.  Avoid places that make you want (trigger) to smoke, such as:  Bars.  Parties.  Smoke-break areas at work.  Spend time with people who do not smoke.  Lower the stress in your life. Stress can make you want to smoke. Try these things to help your stress:  Getting regular exercise.  Deep-breathing exercises.  Yoga.  Meditating.  Doing a body scan. To do this, close your eyes, focus on one area of your body at a time from head to toe, and notice which parts of your body are tense. Try to relax the muscles in those areas.  Download or buy apps on your mobile phone or tablet that can help you stick to your quit plan. There are many free apps, such as QuitGuide from the CDC (Centers for Disease Control and Prevention). You can find more support from smokefree.gov and other websites. This information is not intended to replace advice given to you by your health care provider. Make sure you discuss any questions you have with your health care provider. Document Released:  09/03/2009 Document Revised: 07/05/2016 Document Reviewed: 03/24/2015 Elsevier Interactive Patient Education  2017 [employer] Inc.  

## 2017-03-24 NOTE — Progress Notes (Signed)
[facility] [place]   CC: Follow up peripheral artery occlusive disease  History of Present Illness Alicia [name] is a 66 y.o. female patient of Dr. Bridgett Larsson who is status post left common femoral artery to popliteal bypass graft in April 2012. She returns today for routine surveillance.  She has pain in her low back and a weak feelking in both thighs and calves, after walking about 1/2 block, worse in the left leg; she is not sure if the weakness resolves with rest, denies non healing wounds. She cannot remember if she had her lumbar spine evaluated, states she has generalized arthritis.   Does water aerobics about 5 days/week. She denies any history of stroke, TIA, or MI.   She reports slight headache now, and dyspnea on exertion, denies chest pain.   Pt Diabetic: Yes, states her A1C improved to 7.? Pt smoker: smoker (1/2 ppd, started in her 20's)  Pt meds include: Statin :No, this caused worse weakness in her legs? ASA: Not taking lately, advised to resume Other anticoagulants/antiplatelets: no   Past Medical History:  Diagnosis Date  . Arthritis   . Chronic headaches   . Constipation   . Depression   . Diabetes mellitus   . Hyperlipidemia   . Hypertension   . Obesity   . PAD (peripheral artery disease) ([place])   . Shortness of breath     Social History Social History  Substance Use Topics  . Smoking status: Light Tobacco Smoker    Packs/day: 0.50    Years: 35.00    Types: Cigarettes  . Smokeless tobacco: Never Used     Comment: patient is trying to quit and has gone down to half a pack per day  . Alcohol use 0.5 oz/week    1 Standard drinks or equivalent per week     Comment: occasional use only    Family History Family History  Problem Relation Age of Onset  . Diabetes Father   . Heart disease Mother     NOT before age 1-  Bypass  . Hypertension Mother   . Hypertension Sister   . Varicose Veins Brother   . Heart  disease Brother     Before age 25  . Hypertension Daughter     Past Surgical History:  Procedure Laterality Date  . FEMORAL BYPASS  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 Bridgett Larsson    No Known Allergies  Current Outpatient Prescriptions  Medication Sig Dispense Refill  . aspirin 81 MG tablet Take 81 mg by mouth daily.      . diclofenac (VOLTAREN) 50 MG EC tablet Take 50 mg by mouth 3 (three) times daily.    . empagliflozin (JARDIANCE) 10 MG TABS tablet Take 10 mg by mouth daily.    . enalapril (VASOTEC) 20 MG tablet 2 (two) times daily.    . famotidine (PEPCID) 20 MG tablet Take 20 mg by mouth 2 (two) times daily.     Marland Kitchen ibuprofen (ADVIL,MOTRIN) 800 MG tablet Take 1 tablet (800 mg total) by mouth 3 (three) times daily. 21 tablet 0  . metFORMIN (GLUCOPHAGE) 1000 MG tablet Take 1,000 mg by mouth 2 (two) times daily with a meal.      . metroNIDAZOLE (FLAGYL) 250 MG tablet Take 1 tablet (250 mg total) by mouth 3 (three) times daily. 21 tablet 0  . NIFEdipine (PROCARDIA-XL/ADALAT CC) 60 MG 24 hr tablet 2 (two) times daily.    Marland Kitchen  nystatin (MYCOSTATIN/NYSTOP) 100000 UNIT/GM POWD Apply to dry skin folds twice daily 60 g 1  . nystatin cream (MYCOSTATIN) Apply topically 2 (two) times daily. 30 g 1  . Olmesartan-Amlodipine-HCTZ (TRIBENZOR) 40-5-25 MG TABS Take 1 tablet by mouth daily.    . rosuvastatin (CRESTOR) 5 MG tablet Take 5 mg by mouth daily.      . cyclobenzaprine (FLEXERIL) 10 MG tablet Take 1 tablet (10 mg total) by mouth 2 (two) times daily as needed for muscle spasms. (Patient not taking: Reported on 03/24/2017) 20 tablet 0  . diazepam (VALIUM) 5 MG tablet Take 1 tablet (5 mg total) by mouth 2 (two) times daily. (Patient not taking: Reported on 03/24/2017) 20 tablet 0  . oxyCODONE-acetaminophen (PERCOCET) 5-325 MG per tablet Take 2 tablets by mouth every 4 (four) hours as needed for pain. (Patient not taking: Reported on 03/24/2017) 15 tablet 0  .  oxyCODONE-acetaminophen (PERCOCET/ROXICET) 5-325 MG per tablet Take 2 tablets by mouth every 6 (six) hours as needed for pain. (Patient not taking: Reported on 03/24/2017) 15 tablet 0  . oxyCODONE-acetaminophen (PERCOCET/ROXICET) 5-325 MG per tablet Take 1 tablet by mouth every 6 (six) hours as needed for pain. (Patient not taking: Reported on 03/24/2017) 20 tablet 0  . predniSONE (DELTASONE) 20 MG tablet 3 tabs po daily x 3 days, then 2 tabs x 3 days, then 1.5 tabs x 3 days, then 1 tab x 3 days, then 0.5 tabs x 3 days (Patient not taking: Reported on 03/24/2017) 27 tablet 0   No current facility-administered medications for this visit.     ROS: See HPI for pertinent positives and negatives.   Physical Examination  Vitals:   03/24/17 1158 03/24/17 1203  BP: (!) 191/97 (!) 171/92  Pulse: 97 97  Resp: 18   Temp: 98.7 F (37.1 C)   SpO2: 97%   Weight: 264 lb (119.7 kg)   Height: 5\' 8"  (1.727 m)    Body mass index is 40.14 kg/m.  General: A&O x 3, WDWN, morbidly obese female. Gait: normal Eyes: PERRLA. Pulmonary: Respirations are non labored, distant breath sounds in all fields, without wheezes, rales, or rhonchi. Cardiac: Irregular rhythm, nodetected murmur.     Carotid Bruits Left Right   Negative Negative  Aorta is not palpable. Radial pulses: are 3+ palpable and =   VASCULAR EXAM: Extremitieswithout ischemic changes  without Gangrene; without open wounds.     LE Pulses LEFT RIGHT   FEMORAL  palpable  palpable    POPLITEAL not palpable  not palpable   POSTERIOR TIBIAL 2+ palpable  not palpable    DORSALIS PEDIS  ANTERIOR TIBIAL not palpable  not palpable    Abdomen: soft, obese, NT, no palpable masses. Skin: no rashes, no  ulcers. Musculoskeletal: no muscle wasting or atrophy. Neurologic: A&O X 3; Appropriate Affect, MOTOR FUNCTION: moving all extremities equally, motor strength 5/5 throughout. Speech is fluent/normal. CN 2-12 intact     ASSESSMENT: Alicia [name] is a 66 y.o. female whois status post left common femoral artery to popliteal bypass graft in April 2012. She has weakness in both legs with standing or walking, worse in the left leg.  The pain in her thighs and low back is not likelyrelated to arterial occlusive disease, but is consistent with radiculopathy. Her atherosclerotic risk factors include continued smoking, almost in control DM, uncontrolled hypertension, and obesity.   She has been taking ibuprofen which helps her low back, hips, and posterior thigh pain, which she has noticed  also increases her blood pressure, see Plan.  DATA  Bilateral aortoiliac Duplex  (03-17-17): Technically difficult and limited exam due to abdominal girth. Left femoral popliteal proximal anastomosis velocity is 220 cm/s. No significant stenosis of the bilateral EIA and left CIA, based on limited visualization. Right CIA not visualized.  No previous aortoiliac duplex for comparison.  ABI's (03-17-17): Right: 0.65 (was 0.55 on 12-16-16), waveforms: monophasic; TBI: 0.42 (was 0.42) Left: 0.94 (was 0.87), triphasic PT, monophasic DP; TBI: 0.52 ( was 0.57). Slightly improved bilateral ABI; bilateral TBI are stable and below normal.   12-16-16 Left LE arterial duplex demonstrates left LE arterial bypass graft with elevated velocities in the proximal anastomosis (50-70%) and native inflow artery (50-74%). Inflow disease progression since the last exam on 06-13-16.     PLAN:  I advised pt to call her her PCP's office now to advise her re uncontrolled hypertension. Based on the patient's vascular studies and examination, pt will return to clinic in 9 months with left LE arterial duplex and  ABI's, see me afterward. I advised her to notify us if she develops concerns re the circulation in her feet or legs.   The patient was counseled re smoking cessation and given several free resources re smoking cessation.  I discussed in depth with the patient the nature of atherosclerosis, and emphasized the importance of maximal medical management including strict control of blood pressure, blood glucose, and lipid levels, obtaining regular exercise, and cessation of smoking.  The patient is aware that without maximal medical management the underlying atherosclerotic disease process will progress, limiting the benefit of any interventions.  The patient was given information about PAD including signs, symptoms, treatment, what symptoms should prompt the patient to seek immediate medical care, and risk reduction measures to take.  Clemon Chambers, RN, MSN, FNP-C [facility] Phone: [phone]  Clinic MD: Bridgett Larsson  03/24/17 12:06 PM

## 2017-03-24 NOTE — Progress Notes (Signed)
Vitals:   03/24/17 1158  BP: (!) 191/97  Pulse: 97  Resp: 18  Temp: 98.7 F (37.1 C)  SpO2: 97%  Weight: 264 lb (119.7 kg)  Height: 5\' 8"  (1.727 m)

## 2017-03-28 NOTE — Addendum Note (Signed)
Addended by: Lianne Cure A on: 03/28/2017 09:43 AM   Modules accepted: Orders

## 2017-05-01 DIAGNOSIS — E119 Type 2 diabetes mellitus without complications: Secondary | ICD-10-CM | POA: Diagnosis not present

## 2017-05-01 DIAGNOSIS — M130 Polyarthritis, unspecified: Secondary | ICD-10-CM | POA: Diagnosis not present

## 2017-05-01 DIAGNOSIS — I10 Essential (primary) hypertension: Secondary | ICD-10-CM | POA: Diagnosis not present

## 2017-05-01 DIAGNOSIS — E669 Obesity, unspecified: Secondary | ICD-10-CM | POA: Diagnosis not present

## 2017-06-16 DIAGNOSIS — E118 Type 2 diabetes mellitus with unspecified complications: Secondary | ICD-10-CM | POA: Diagnosis not present

## 2017-06-16 DIAGNOSIS — I10 Essential (primary) hypertension: Secondary | ICD-10-CM | POA: Diagnosis not present

## 2017-06-16 DIAGNOSIS — M545 Low back pain: Secondary | ICD-10-CM | POA: Diagnosis not present

## 2017-06-16 DIAGNOSIS — M130 Polyarthritis, unspecified: Secondary | ICD-10-CM | POA: Diagnosis not present

## 2017-06-23 DIAGNOSIS — M130 Polyarthritis, unspecified: Secondary | ICD-10-CM | POA: Diagnosis not present

## 2017-06-23 DIAGNOSIS — I10 Essential (primary) hypertension: Secondary | ICD-10-CM | POA: Diagnosis not present

## 2017-06-23 DIAGNOSIS — E118 Type 2 diabetes mellitus with unspecified complications: Secondary | ICD-10-CM | POA: Diagnosis not present

## 2017-07-14 DIAGNOSIS — E669 Obesity, unspecified: Secondary | ICD-10-CM | POA: Diagnosis not present

## 2017-07-14 DIAGNOSIS — E785 Hyperlipidemia, unspecified: Secondary | ICD-10-CM | POA: Diagnosis not present

## 2017-07-14 DIAGNOSIS — E118 Type 2 diabetes mellitus with unspecified complications: Secondary | ICD-10-CM | POA: Diagnosis not present

## 2017-07-14 DIAGNOSIS — I10 Essential (primary) hypertension: Secondary | ICD-10-CM | POA: Diagnosis not present

## 2017-11-06 DIAGNOSIS — E119 Type 2 diabetes mellitus without complications: Secondary | ICD-10-CM | POA: Diagnosis not present

## 2017-11-06 DIAGNOSIS — I10 Essential (primary) hypertension: Secondary | ICD-10-CM | POA: Diagnosis not present

## 2017-11-06 DIAGNOSIS — R0602 Shortness of breath: Secondary | ICD-10-CM | POA: Diagnosis not present

## 2017-11-16 DIAGNOSIS — Z1231 Encounter for screening mammogram for malignant neoplasm of breast: Secondary | ICD-10-CM | POA: Diagnosis not present

## 2017-11-24 DIAGNOSIS — I5040 Unspecified combined systolic (congestive) and diastolic (congestive) heart failure: Secondary | ICD-10-CM | POA: Diagnosis not present

## 2017-12-12 DIAGNOSIS — E119 Type 2 diabetes mellitus without complications: Secondary | ICD-10-CM | POA: Diagnosis not present

## 2017-12-12 DIAGNOSIS — M130 Polyarthritis, unspecified: Secondary | ICD-10-CM | POA: Diagnosis not present

## 2017-12-12 DIAGNOSIS — I110 Hypertensive heart disease with heart failure: Secondary | ICD-10-CM | POA: Diagnosis not present

## 2017-12-12 DIAGNOSIS — I10 Essential (primary) hypertension: Secondary | ICD-10-CM | POA: Diagnosis not present

## 2017-12-29 ENCOUNTER — Encounter (HOSPITAL_COMMUNITY): Payer: Medicare HMO

## 2017-12-29 ENCOUNTER — Other Ambulatory Visit (HOSPITAL_COMMUNITY): Payer: Medicare HMO

## 2017-12-29 ENCOUNTER — Ambulatory Visit: Payer: Medicare HMO | Admitting: Family

## 2018-01-03 DIAGNOSIS — I110 Hypertensive heart disease with heart failure: Secondary | ICD-10-CM | POA: Diagnosis not present

## 2018-01-03 DIAGNOSIS — E119 Type 2 diabetes mellitus without complications: Secondary | ICD-10-CM | POA: Diagnosis not present

## 2018-01-03 DIAGNOSIS — I10 Essential (primary) hypertension: Secondary | ICD-10-CM | POA: Diagnosis not present

## 2018-01-26 DIAGNOSIS — E669 Obesity, unspecified: Secondary | ICD-10-CM | POA: Diagnosis not present

## 2018-01-26 DIAGNOSIS — E1100 Type 2 diabetes mellitus with hyperosmolarity without nonketotic hyperglycemic-hyperosmolar coma (NKHHC): Secondary | ICD-10-CM | POA: Diagnosis not present

## 2018-01-26 DIAGNOSIS — I10 Essential (primary) hypertension: Secondary | ICD-10-CM | POA: Diagnosis not present

## 2018-02-05 ENCOUNTER — Ambulatory Visit (HOSPITAL_COMMUNITY)
Admission: RE | Admit: 2018-02-05 | Discharge: 2018-02-05 | Disposition: A | Discharge status: Home / Self Care | Payer: Medicare HMO | Source: Ambulatory Visit | Attending: Family | Admitting: Family

## 2018-02-05 ENCOUNTER — Ambulatory Visit (INDEPENDENT_AMBULATORY_CARE_PROVIDER_SITE_OTHER)
Admission: RE | Admit: 2018-02-05 | Discharge: 2018-02-05 | Disposition: A | Discharge status: Home / Self Care | Payer: Medicare HMO | Source: Ambulatory Visit | Attending: Family | Admitting: Family

## 2018-02-05 DIAGNOSIS — I771 Stricture of artery: Secondary | ICD-10-CM | POA: Insufficient documentation

## 2018-02-05 DIAGNOSIS — I779 Disorder of arteries and arterioles, unspecified: Secondary | ICD-10-CM

## 2018-02-05 DIAGNOSIS — Z95828 Presence of other vascular implants and grafts: Secondary | ICD-10-CM | POA: Diagnosis not present

## 2018-02-05 DIAGNOSIS — E1151 Type 2 diabetes mellitus with diabetic peripheral angiopathy without gangrene: Secondary | ICD-10-CM

## 2018-02-05 DIAGNOSIS — I739 Peripheral vascular disease, unspecified: Secondary | ICD-10-CM | POA: Insufficient documentation

## 2018-02-06 ENCOUNTER — Ambulatory Visit: Payer: Medicare HMO | Admitting: Family

## 2018-02-06 ENCOUNTER — Encounter: Payer: Self-pay | Admitting: Family

## 2018-02-06 VITALS — BP 154/88 | HR 85 | Temp 97.9°F | Resp 20 | Ht 68.0 in | Wt 261.0 lb

## 2018-02-06 DIAGNOSIS — F17200 Nicotine dependence, unspecified, uncomplicated: Secondary | ICD-10-CM

## 2018-02-06 DIAGNOSIS — Z95828 Presence of other vascular implants and grafts: Secondary | ICD-10-CM | POA: Diagnosis not present

## 2018-02-06 DIAGNOSIS — E1151 Type 2 diabetes mellitus with diabetic peripheral angiopathy without gangrene: Secondary | ICD-10-CM

## 2018-02-06 DIAGNOSIS — I779 Disorder of arteries and arterioles, unspecified: Secondary | ICD-10-CM

## 2018-02-06 NOTE — Patient Instructions (Signed)
Steps to Quit Smoking Smoking tobacco can be bad for your health. It can also affect almost every organ in your body. Smoking puts you and people around you at risk for many serious long-lasting (chronic) diseases. Quitting smoking is hard, but it is one of the best things that you can do for your health. It is never too late to quit. What are the benefits of quitting smoking? When you quit smoking, you lower your risk for getting serious diseases and conditions. They can include:  Lung cancer or lung disease.  Heart disease.  Stroke.  Heart attack.  Not being able to have children (infertility).  Weak bones (osteoporosis) and broken bones (fractures).  If you have coughing, wheezing, and shortness of breath, those symptoms may get better when you quit. You may also get sick less often. If you are pregnant, quitting smoking can help to lower your chances of having a baby of low birth weight. What can I do to help me quit smoking? Talk with your doctor about what can help you quit smoking. Some things you can do (strategies) include:  Quitting smoking totally, instead of slowly cutting back how much you smoke over a period of time.  Going to in-person counseling. You are more likely to quit if you go to many counseling sessions.  Using resources and support systems, such as: ? Online chats with a counselor. ? Phone quitlines. ? Printed self-help materials. ? Support groups or group counseling. ? Text messaging programs. ? Mobile phone apps or applications.  Taking medicines. Some of these medicines may have nicotine in them. If you are pregnant or breastfeeding, do not take any medicines to quit smoking unless your doctor says it is okay. Talk with your doctor about counseling or other things that can help you.  Talk with your doctor about using more than one strategy at the same time, such as taking medicines while you are also going to in-person counseling. This can help make  quitting easier. What things can I do to make it easier to quit? Quitting smoking might feel very hard at first, but there is a lot that you can do to make it easier. Take these steps:  Talk to your family and friends. Ask them to support and encourage you.  Call phone quitlines, reach out to support groups, or work with a counselor.  Ask people who smoke to not smoke around you.  Avoid places that make you want (trigger) to smoke, such as: ? Bars. ? Parties. ? Smoke-break areas at work.  Spend time with people who do not smoke.  Lower the stress in your life. Stress can make you want to smoke. Try these things to help your stress: ? Getting regular exercise. ? Deep-breathing exercises. ? Yoga. ? Meditating. ? Doing a body scan. To do this, close your eyes, focus on one area of your body at a time from head to toe, and notice which parts of your body are tense. Try to relax the muscles in those areas.  Download or buy apps on your mobile phone or tablet that can help you stick to your quit plan. There are many free apps, such as QuitGuide from the CDC (Centers for Disease Control and Prevention). You can find more support from smokefree.gov and other websites.  This information is not intended to replace advice given to you by your health care provider. Make sure you discuss any questions you have with your health care provider. Document Released: 09/03/2009 Document   Revised: 07/05/2016 Document Reviewed: 03/24/2015 Elsevier Interactive Patient Education  2018 Elsevier Inc.     Peripheral Vascular Disease Peripheral vascular disease (PVD) is a disease of the blood vessels that are not part of your heart and brain. A simple term for PVD is poor circulation. In most cases, PVD narrows the blood vessels that carry blood from your heart to the rest of your body. This can result in a decreased supply of blood to your arms, legs, and internal organs, like your stomach or kidneys.  However, it most often affects a person's lower legs and feet. There are two types of PVD.  Organic PVD. This is the more common type. It is caused by damage to the structure of blood vessels.  Functional PVD. This is caused by conditions that make blood vessels contract and tighten (spasm).  Without treatment, PVD tends to get worse over time. PVD can also lead to acute ischemic limb. This is when an arm or limb suddenly has trouble getting enough blood. This is a medical emergency. Follow these instructions at home:  Take medicines only as told by your doctor.  Do not use any tobacco products, including cigarettes, chewing tobacco, or electronic cigarettes. If you need help quitting, ask your doctor.  Lose weight if you are overweight, and maintain a healthy weight as told by your doctor.  Eat a diet that is low in fat and cholesterol. If you need help, ask your doctor.  Exercise regularly. Ask your doctor for some good activities for you.  Take good care of your feet. ? Wear comfortable shoes that fit well. ? Check your feet often for any cuts or sores. Contact a doctor if:  You have cramps in your legs while walking.  You have leg pain when you are at rest.  You have coldness in a leg or foot.  Your skin changes.  You are unable to get or have an erection (erectile dysfunction).  You have cuts or sores on your feet that are not healing. Get help right away if:  Your arm or leg turns cold and blue.  Your arms or legs become red, warm, swollen, painful, or numb.  You have chest pain or trouble breathing.  You suddenly have weakness in your face, arm, or leg.  You become very confused or you cannot speak.  You suddenly have a very bad headache.  You suddenly cannot see. This information is not intended to replace advice given to you by your health care provider. Make sure you discuss any questions you have with your health care provider. Document Released:  02/01/2010 Document Revised: 04/14/2016 Document Reviewed: 04/17/2014 Elsevier Interactive Patient Education  2017 Elsevier Inc.  

## 2018-02-06 NOTE — Progress Notes (Signed)
[facility] [place]   CC: Follow up peripheral artery occlusive disease  History of Present Illness Alicia [name] is a 67 y.o. female who is status post left common femoral artery to popliteal bypass graft in April 2012 by Dr. Bridgett Larsson. She returns today for routine surveillance.  She has pain in her low back and a weak feelking in both thighs and calves, after walking about 1/2 block, worse in the left leg; she is not sure if the weakness resolves with rest, denies non healing wounds. She cannot remember if she had her lumbar spine evaluated, states she has generalized arthritis, had low back pain and bilateral sciatica sx's for years, is worsening.    Does water aerobics about 5 days/week. She denies any history of stroke, TIA, or MI.   She is working with her PCP to get her blood pressure under control.   Her med list is not up tod date. She is no longer taking enalapril, vasotec, or the tribenzor.    Pt Diabetic: Yes, states her A1C improved to 7.? Pt smoker: smoker (1/2 ppd, started in her 20's)  Pt meds include: Statin :Crestor ASA: Not taking lately, advised to resume, defer to her PCP Other anticoagulants/antiplatelets: no    Past Medical History:  Diagnosis Date  . Arthritis   . Chronic headaches   . Constipation   . Depression   . Diabetes mellitus   . Hyperlipidemia   . Hypertension   . Obesity   . PAD (peripheral artery disease) ([place])   . Shortness of breath     Social History Social History   Tobacco Use  . Smoking status: Light Tobacco Smoker    Packs/day: 0.50    Years: 35.00    Pack years: 17.50    Types: Cigarettes  . Smokeless tobacco: Never Used  . Tobacco comment: patient is trying to quit and has gone down to half a pack per day  Substance Use Topics  . Alcohol use: Yes    Alcohol/week: 0.5 oz    Types: 1 Standard drinks or equivalent per week    Comment: occasional use only  . Drug use: No    Family  History Family History  Problem Relation Age of Onset  . Diabetes Father   . Heart disease Mother        NOT before age 49-  Bypass  . Hypertension Mother   . Hypertension Sister   . Varicose Veins Brother   . Heart disease Brother        Before age 21  . Hypertension Daughter     Past Surgical History:  Procedure Laterality Date  . FEMORAL BYPASS  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 Bridgett Larsson    No Known Allergies  Current Outpatient Medications  Medication Sig Dispense Refill  . metFORMIN (GLUCOPHAGE) 1000 MG tablet Take 1,000 mg by mouth 2 (two) times daily with a meal.      . nystatin (MYCOSTATIN/NYSTOP) 100000 UNIT/GM POWD Apply to dry skin folds twice daily 60 g 1  . nystatin cream (MYCOSTATIN) Apply topically 2 (two) times daily. 30 g 1  . amLODipine (NORVASC) 5 MG tablet     . aspirin 81 MG tablet Take 81 mg by mouth daily.      . cyclobenzaprine (FLEXERIL) 10 MG tablet Take 1 tablet (10 mg total) by mouth 2 (two) times daily as needed for muscle spasms. (Patient not taking: Reported on  03/24/2017) 20 tablet 0  . diazepam (VALIUM) 5 MG tablet Take 1 tablet (5 mg total) by mouth 2 (two) times daily. (Patient not taking: Reported on 03/24/2017) 20 tablet 0  . diclofenac (VOLTAREN) 50 MG EC tablet Take 50 mg by mouth 3 (three) times daily.    . empagliflozin (JARDIANCE) 10 MG TABS tablet Take 10 mg by mouth daily.    . enalapril (VASOTEC) 20 MG tablet 2 (two) times daily.    Marland Kitchen ENTRESTO 97-103 MG     . famotidine (PEPCID) 20 MG tablet Take 20 mg by mouth 2 (two) times daily.     . hydrochlorothiazide (HYDRODIURIL) 25 MG tablet     . ibuprofen (ADVIL,MOTRIN) 800 MG tablet Take 1 tablet (800 mg total) by mouth 3 (three) times daily. (Patient not taking: Reported on 02/06/2018) 21 tablet 0  . indapamide (LOZOL) 1.25 MG tablet     . metroNIDAZOLE (FLAGYL) 250 MG tablet Take 1 tablet (250 mg total) by mouth 3 (three) times daily. (Patient not taking:  Reported on 02/06/2018) 21 tablet 0  . NIFEdipine (PROCARDIA-XL/ADALAT CC) 60 MG 24 hr tablet 2 (two) times daily.    . Olmesartan-Amlodipine-HCTZ (TRIBENZOR) 40-5-25 MG TABS Take 1 tablet by mouth daily.    Marland Kitchen oxyCODONE-acetaminophen (PERCOCET) 5-325 MG per tablet Take 2 tablets by mouth every 4 (four) hours as needed for pain. (Patient not taking: Reported on 03/24/2017) 15 tablet 0  . oxyCODONE-acetaminophen (PERCOCET/ROXICET) 5-325 MG per tablet Take 2 tablets by mouth every 6 (six) hours as needed for pain. (Patient not taking: Reported on 03/24/2017) 15 tablet 0  . oxyCODONE-acetaminophen (PERCOCET/ROXICET) 5-325 MG per tablet Take 1 tablet by mouth every 6 (six) hours as needed for pain. (Patient not taking: Reported on 03/24/2017) 20 tablet 0  . predniSONE (DELTASONE) 20 MG tablet 3 tabs po daily x 3 days, then 2 tabs x 3 days, then 1.5 tabs x 3 days, then 1 tab x 3 days, then 0.5 tabs x 3 days (Patient not taking: Reported on 03/24/2017) 27 tablet 0  . rosuvastatin (CRESTOR) 10 MG tablet     . rosuvastatin (CRESTOR) 5 MG tablet Take 5 mg by mouth daily.       No current facility-administered medications for this visit.     ROS: See HPI for pertinent positives and negatives.   Physical Examination  Vitals:   02/06/18 1328  BP: (!) 154/88  Pulse: 85  Resp: 20  Temp: 97.9 F (36.6 C)  TempSrc: Oral  SpO2: 98%  Weight: 261 lb (118.4 kg)  Height: 5\' 8"  (1.727 m)   Body mass index is 39.68 kg/m.  General: A&O x 3, WDWN, obese female. Gait: normal HENT: No gross abnormalities  Eyes: PERRLA. Pulmonary: Respirations are non labored, distant breath sounds in all fields, without wheezes, rales,or rhonchi. Cardiac: Regular rhythm, nodetected murmur.     Carotid Bruits Left Right   Negative Negative   Abdominal aortic pulse is not palpable.  Radial pulses: are 2+palpable and =   VASCULAR EXAM: Extremitieswithout ischemic changes  without  Gangrene; without open wounds.     LE Pulses LEFT RIGHT   FEMORAL  2+palpable  1+palpable    POPLITEAL 2+palpable  not palpable   POSTERIOR TIBIAL not palpable  not palpable    DORSALIS PEDIS  ANTERIOR TIBIAL notpalpable  1+ palpable    Abdomen: soft, obese, NT, no palpable masses Skin: no rashes, no cellulitis, no ulcers noted Musculoskeletal: no muscle wasting or atrophy.  Neurologic:  A&O X 3; appropriate affect, Sensation is normal; MOTOR FUNCTION:  moving all extremities equally, motor strength 5/5 throughout. Speech is fluent/normal. CN 2-12 intact. Psychiatric: Thought content is normal, mood appropriate for clinical situation.     ASSESSMENT: SHYLA GAYHEART is a 67 y.o. female whois status post left common femoral artery to popliteal bypass graft in April 2012. She has weakness in both legs with standing or walking, worse in the left leg. She has intermittent pain that originates in both hips and low back that radiates to her calves.  The pain in her thighsand low back is more likely due to radiculopathy, since she has the pain when not walking at times. She does have moderate peripheral artery occlusive disease in both legs.   Her atherosclerotic risk factors include continuedsmoking, almost in controlDM, hypertension, and obesity.    DATA  Left LE Arterial Duplex (02/06/18): Increased velocity in the inflow artery to the bypass graft at 303 cm/s. No significant stenosis in the bypass graft. No significant change compared to the exam on 12-16-16.      ABI (Date: 02/06/2018):  R:   ABI: 0.66 (was 0.63 on 03-17-17),   PT: mono  DP: mono  TBI:  0.70 (was 0.42)  L:   ABI: 0.76 (was 0.94),   PT: bi  DP: mono  TBI: 0.96 (was 0.52)  Stable right ABI with  monophasic waveforms and moderate disease.  Decline in left ABI with bi and monophasic waveforms and moderate disease.   Improved bilateral TBI.    Bilateral aortoiliac Duplex  (03-17-17): Technically difficult and limited exam due to abdominal girth. Left femoral popliteal proximal anastomosis velocity is 220 cm/s. No significant stenosis of the bilateral EIA and left CIA, based on limited visualization. Right CIA not visualized.  No previous aortoiliac duplex for comparison.   PLAN:  Based on the patient's vascular studies and examination, and after discussing with Dr. Donnetta Hutching, pt will return to clinic in 6 monthswith left LE arterial duplex and ABI's, see Dr. Bridgett Larsson only. I advised her to notify us if she develops concerns re the circulation in her feet or legs.   The patient was counseled re smoking cessation and given several free resources re smoking cessation.  I discussed in depth with the patient the nature of atherosclerosis, and emphasized the importance of maximal medical management including strict control of blood pressure, blood glucose, and lipid levels, obtaining regular exercise, and cessation of smoking.  The patient is aware that without maximal medical management the underlying atherosclerotic disease process will progress, limiting the benefit of any interventions.  The patient was given information about PAD including signs, symptoms, treatment, what symptoms should prompt the patient to seek immediate medical care, and risk reduction measures to take.  Clemon Chambers, RN, MSN, FNP-C [facility] Phone: [phone]  Clinic MD: Early  02/06/18 2:05 PM

## 2018-02-07 ENCOUNTER — Other Ambulatory Visit: Payer: Self-pay

## 2018-02-07 DIAGNOSIS — I739 Peripheral vascular disease, unspecified: Secondary | ICD-10-CM

## 2018-02-23 DIAGNOSIS — I10 Essential (primary) hypertension: Secondary | ICD-10-CM | POA: Diagnosis not present

## 2018-02-23 DIAGNOSIS — E669 Obesity, unspecified: Secondary | ICD-10-CM | POA: Diagnosis not present

## 2018-02-23 DIAGNOSIS — E118 Type 2 diabetes mellitus with unspecified complications: Secondary | ICD-10-CM | POA: Diagnosis not present

## 2018-02-23 DIAGNOSIS — I5040 Unspecified combined systolic (congestive) and diastolic (congestive) heart failure: Secondary | ICD-10-CM | POA: Diagnosis not present

## 2018-03-15 ENCOUNTER — Inpatient Hospital Stay (HOSPITAL_COMMUNITY)
Admission: EM | Admit: 2018-03-15 | Discharge: 2018-03-18 | DRG: 065 | Disposition: A | Discharge status: Home Health | Payer: Medicare HMO | Attending: Internal Medicine | Admitting: Internal Medicine

## 2018-03-15 ENCOUNTER — Emergency Department (HOSPITAL_COMMUNITY): Payer: Medicare HMO

## 2018-03-15 ENCOUNTER — Encounter (HOSPITAL_COMMUNITY): Payer: Self-pay

## 2018-03-15 ENCOUNTER — Other Ambulatory Visit: Payer: Self-pay

## 2018-03-15 DIAGNOSIS — R296 Repeated falls: Secondary | ICD-10-CM | POA: Diagnosis not present

## 2018-03-15 DIAGNOSIS — R402252 Coma scale, best verbal response, oriented, at arrival to emergency department: Secondary | ICD-10-CM | POA: Diagnosis present

## 2018-03-15 DIAGNOSIS — I63311 Cerebral infarction due to thrombosis of right middle cerebral artery: Secondary | ICD-10-CM | POA: Diagnosis not present

## 2018-03-15 DIAGNOSIS — I63233 Cerebral infarction due to unspecified occlusion or stenosis of bilateral carotid arteries: Secondary | ICD-10-CM | POA: Diagnosis not present

## 2018-03-15 DIAGNOSIS — F17210 Nicotine dependence, cigarettes, uncomplicated: Secondary | ICD-10-CM | POA: Diagnosis present

## 2018-03-15 DIAGNOSIS — I351 Nonrheumatic aortic (valve) insufficiency: Secondary | ICD-10-CM | POA: Diagnosis not present

## 2018-03-15 DIAGNOSIS — Z8249 Family history of ischemic heart disease and other diseases of the circulatory system: Secondary | ICD-10-CM | POA: Diagnosis not present

## 2018-03-15 DIAGNOSIS — E1122 Type 2 diabetes mellitus with diabetic chronic kidney disease: Secondary | ICD-10-CM | POA: Diagnosis present

## 2018-03-15 DIAGNOSIS — I6389 Other cerebral infarction: Secondary | ICD-10-CM | POA: Diagnosis not present

## 2018-03-15 DIAGNOSIS — S3992XA Unspecified injury of lower back, initial encounter: Secondary | ICD-10-CM | POA: Diagnosis not present

## 2018-03-15 DIAGNOSIS — E1169 Type 2 diabetes mellitus with other specified complication: Secondary | ICD-10-CM | POA: Diagnosis not present

## 2018-03-15 DIAGNOSIS — Z7982 Long term (current) use of aspirin: Secondary | ICD-10-CM | POA: Diagnosis not present

## 2018-03-15 DIAGNOSIS — R531 Weakness: Secondary | ICD-10-CM | POA: Diagnosis present

## 2018-03-15 DIAGNOSIS — R402362 Coma scale, best motor response, obeys commands, at arrival to emergency department: Secondary | ICD-10-CM | POA: Diagnosis present

## 2018-03-15 DIAGNOSIS — E1165 Type 2 diabetes mellitus with hyperglycemia: Secondary | ICD-10-CM | POA: Diagnosis present

## 2018-03-15 DIAGNOSIS — I130 Hypertensive heart and chronic kidney disease with heart failure and stage 1 through stage 4 chronic kidney disease, or unspecified chronic kidney disease: Secondary | ICD-10-CM | POA: Diagnosis not present

## 2018-03-15 DIAGNOSIS — E785 Hyperlipidemia, unspecified: Secondary | ICD-10-CM | POA: Diagnosis not present

## 2018-03-15 DIAGNOSIS — R29700 NIHSS score 0: Secondary | ICD-10-CM | POA: Diagnosis present

## 2018-03-15 DIAGNOSIS — R402142 Coma scale, eyes open, spontaneous, at arrival to emergency department: Secondary | ICD-10-CM | POA: Diagnosis present

## 2018-03-15 DIAGNOSIS — I5032 Chronic diastolic (congestive) heart failure: Secondary | ICD-10-CM | POA: Diagnosis present

## 2018-03-15 DIAGNOSIS — N182 Chronic kidney disease, stage 2 (mild): Secondary | ICD-10-CM | POA: Diagnosis present

## 2018-03-15 DIAGNOSIS — I160 Hypertensive urgency: Secondary | ICD-10-CM | POA: Diagnosis not present

## 2018-03-15 DIAGNOSIS — I639 Cerebral infarction, unspecified: Principal | ICD-10-CM | POA: Diagnosis present

## 2018-03-15 DIAGNOSIS — Z6838 Body mass index (BMI) 38.0-38.9, adult: Secondary | ICD-10-CM

## 2018-03-15 DIAGNOSIS — E669 Obesity, unspecified: Secondary | ICD-10-CM

## 2018-03-15 DIAGNOSIS — I739 Peripheral vascular disease, unspecified: Secondary | ICD-10-CM | POA: Diagnosis present

## 2018-03-15 DIAGNOSIS — R5383 Other fatigue: Secondary | ICD-10-CM | POA: Diagnosis not present

## 2018-03-15 DIAGNOSIS — Z7984 Long term (current) use of oral hypoglycemic drugs: Secondary | ICD-10-CM | POA: Diagnosis not present

## 2018-03-15 DIAGNOSIS — Z833 Family history of diabetes mellitus: Secondary | ICD-10-CM

## 2018-03-15 DIAGNOSIS — Z79899 Other long term (current) drug therapy: Secondary | ICD-10-CM | POA: Diagnosis not present

## 2018-03-15 DIAGNOSIS — E1151 Type 2 diabetes mellitus with diabetic peripheral angiopathy without gangrene: Secondary | ICD-10-CM | POA: Diagnosis present

## 2018-03-15 DIAGNOSIS — N289 Disorder of kidney and ureter, unspecified: Secondary | ICD-10-CM

## 2018-03-15 DIAGNOSIS — R079 Chest pain, unspecified: Secondary | ICD-10-CM | POA: Diagnosis not present

## 2018-03-15 DIAGNOSIS — I509 Heart failure, unspecified: Secondary | ICD-10-CM

## 2018-03-15 DIAGNOSIS — R42 Dizziness and giddiness: Secondary | ICD-10-CM | POA: Diagnosis not present

## 2018-03-15 LAB — HEPATIC FUNCTION PANEL
ALBUMIN: 3.5 g/dL (ref 3.5–5.0)
ALK PHOS: 90 U/L (ref 38–126)
ALT: 12 U/L — AB (ref 14–54)
AST: 11 U/L — ABNORMAL LOW (ref 15–41)
BILIRUBIN TOTAL: 0.9 mg/dL (ref 0.3–1.2)
Bilirubin, Direct: <0.1 mg/dL — ABNORMAL LOW (ref 0.1–0.5)
Total Protein: 7.1 g/dL (ref 6.5–8.1)

## 2018-03-15 LAB — URINALYSIS, ROUTINE W REFLEX MICROSCOPIC
BILIRUBIN URINE: NEGATIVE
Glucose, UA: NEGATIVE mg/dL
Hgb urine dipstick: NEGATIVE
KETONES UR: NEGATIVE mg/dL
LEUKOCYTES UA: NEGATIVE
Nitrite: NEGATIVE
Protein, ur: 100 mg/dL — AB
Specific Gravity, Urine: 1.015 (ref 1.005–1.030)
pH: 7 (ref 5.0–8.0)

## 2018-03-15 LAB — CBC
HCT: 39.7 % (ref 36.0–46.0)
HEMOGLOBIN: 12.9 g/dL (ref 12.0–15.0)
MCH: 30.6 pg (ref 26.0–34.0)
MCHC: 32.5 g/dL (ref 30.0–36.0)
MCV: 94.1 fL (ref 78.0–100.0)
PLATELETS: 286 10*3/uL (ref 150–400)
RBC: 4.22 MIL/uL (ref 3.87–5.11)
RDW: 14.2 % (ref 11.5–15.5)
WBC: 6.7 10*3/uL (ref 4.0–10.5)

## 2018-03-15 LAB — I-STAT TROPONIN, ED: Troponin i, poc: 0.01 ng/mL (ref 0.00–0.08)

## 2018-03-15 LAB — BASIC METABOLIC PANEL
ANION GAP: 11 (ref 5–15)
BUN: 17 mg/dL (ref 6–20)
CHLORIDE: 103 mmol/L (ref 101–111)
CO2: 25 mmol/L (ref 22–32)
CREATININE: 1.08 mg/dL — AB (ref 0.44–1.00)
Calcium: 8.6 mg/dL — ABNORMAL LOW (ref 8.9–10.3)
GFR calc non Af Amer: 52 mL/min — ABNORMAL LOW (ref >60)
Glucose, Bld: 216 mg/dL — ABNORMAL HIGH (ref 65–99)
POTASSIUM: 3.6 mmol/L (ref 3.5–5.1)
Sodium: 139 mmol/L (ref 135–145)

## 2018-03-15 LAB — CK: Total CK: 53 U/L (ref 38–234)

## 2018-03-15 LAB — BRAIN NATRIURETIC PEPTIDE: B NATRIURETIC PEPTIDE 5: 146.6 pg/mL — AB (ref 0.0–100.0)

## 2018-03-15 MED ORDER — ASPIRIN 300 MG RE SUPP: 300.0000 mg | Freq: Every day | RECTAL | Status: DC

## 2018-03-15 MED ORDER — ACETAMINOPHEN 325 MG PO TABS
650.0000 mg | ORAL_TABLET | ORAL | Status: DC | PRN
  Administered 2018-03-16 – 2018-03-17 (×4): 650 mg via ORAL
  Filled 2018-03-15 (×4): qty 2

## 2018-03-15 MED ORDER — ENOXAPARIN SODIUM 40 MG/0.4ML ~~LOC~~ SOLN
40.0000 mg | Freq: Every day | SUBCUTANEOUS | Status: DC
  Administered 2018-03-16 – 2018-03-18 (×3): 40 mg via SUBCUTANEOUS
  Filled 2018-03-15 (×3): qty 0.4

## 2018-03-15 MED ORDER — ACETAMINOPHEN 650 MG RE SUPP: 650.0000 mg | RECTAL | Status: DC | PRN

## 2018-03-15 MED ORDER — ENALAPRIL MALEATE 20 MG PO TABS
20.0000 mg | ORAL_TABLET | Freq: Two times a day (BID) | ORAL | Status: DC
  Administered 2018-03-16 – 2018-03-18 (×5): 20 mg via ORAL
  Filled 2018-03-15 (×7): qty 1

## 2018-03-15 MED ORDER — SENNOSIDES-DOCUSATE SODIUM 8.6-50 MG PO TABS: 1.0000 | ORAL_TABLET | Freq: Every evening | ORAL | Status: DC | PRN

## 2018-03-15 MED ORDER — LABETALOL HCL 5 MG/ML IV SOLN
10.0000 mg | Freq: Once | INTRAVENOUS | Status: AC
Start: 2018-03-15 — End: 2018-03-15
  Administered 2018-03-15: 10 mg via INTRAVENOUS
  Filled 2018-03-15: qty 4

## 2018-03-15 MED ORDER — ROSUVASTATIN CALCIUM 5 MG PO TABS: 10.0000 mg | ORAL_TABLET | Freq: Every day | ORAL | Status: DC

## 2018-03-15 MED ORDER — INSULIN ASPART 100 UNIT/ML ~~LOC~~ SOLN: 0.0000 [IU] | Freq: Every day | SUBCUTANEOUS | Status: DC

## 2018-03-15 MED ORDER — FAMOTIDINE 20 MG PO TABS: 20.0000 mg | ORAL_TABLET | Freq: Two times a day (BID) | ORAL | Status: DC | PRN

## 2018-03-15 MED ORDER — ASPIRIN 325 MG PO TABS
325.0000 mg | ORAL_TABLET | Freq: Every day | ORAL | Status: DC
  Administered 2018-03-16: 325 mg via ORAL
  Filled 2018-03-15: qty 1

## 2018-03-15 MED ORDER — HYDRALAZINE HCL 25 MG PO TABS
25.0000 mg | ORAL_TABLET | Freq: Once | ORAL | Status: AC
  Administered 2018-03-15: 25 mg via ORAL
  Filled 2018-03-15: qty 1

## 2018-03-15 MED ORDER — AMLODIPINE BESYLATE 5 MG PO TABS
5.0000 mg | ORAL_TABLET | Freq: Once | ORAL | Status: DC
  Filled 2018-03-15: qty 1

## 2018-03-15 MED ORDER — ACETAMINOPHEN 160 MG/5ML PO SOLN: 650.0000 mg | ORAL | Status: DC | PRN

## 2018-03-15 MED ORDER — AMLODIPINE BESYLATE 5 MG PO TABS
5.0000 mg | ORAL_TABLET | Freq: Once | ORAL | Status: AC
  Administered 2018-03-15: 5 mg via ORAL
  Filled 2018-03-15: qty 1

## 2018-03-15 MED ORDER — AMLODIPINE BESYLATE 5 MG PO TABS: 5.0000 mg | ORAL_TABLET | Freq: Every day | ORAL | Status: DC

## 2018-03-15 MED ORDER — STROKE: EARLY STAGES OF RECOVERY BOOK
Freq: Once | Status: AC
  Administered 2018-03-16: 02:00:00
  Filled 2018-03-15: qty 1

## 2018-03-15 MED ORDER — INSULIN ASPART 100 UNIT/ML ~~LOC~~ SOLN
0.0000 [IU] | Freq: Three times a day (TID) | SUBCUTANEOUS | Status: DC
  Administered 2018-03-16 (×2): 2 [IU] via SUBCUTANEOUS
  Administered 2018-03-17: 1 [IU] via SUBCUTANEOUS
  Administered 2018-03-17: 2 [IU] via SUBCUTANEOUS
  Administered 2018-03-18: 1 [IU] via SUBCUTANEOUS
  Administered 2018-03-18: 2 [IU] via SUBCUTANEOUS

## 2018-03-15 NOTE — Consult Note (Signed)
Referring Physician: Dr. Billy Fischer    Chief Complaint: Subcentimer acute right corona radiata ischemic infarction.   HPI: Alicia [name] is an 67 y.o. female presenting to the ED with generalized weakness and lightheadedness, in conjunction with 2 falls in the past few days. An MRI was obtained, revealing a subcentimeter acute right corona radiata ischemic infarction. Neurology was consulted for recommendations.   Stroke risk factors: DM, HLD, HTN, PAD and obesity. She takes ASA at home intermittently.   Past Medical History:  Diagnosis Date  . Arthritis   . Chronic headaches   . Constipation   . Depression   . Diabetes mellitus   . Hyperlipidemia   . Hypertension   . Obesity   . PAD (peripheral artery disease) ([place])   . Shortness of breath     Past Surgical History:  Procedure Laterality Date  . FEMORAL BYPASS  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 Bridgett Larsson    Family History  Problem Relation Age of Onset  . Diabetes Father   . Heart disease Mother        NOT before age 65-  Bypass  . Hypertension Mother   . Hypertension Sister   . Varicose Veins Brother   . Heart disease Brother        Before age 43  . Hypertension Daughter    Social History:  reports that she has been smoking cigarettes.  She has a 17.50 pack-year smoking history. She has never used smokeless tobacco. She reports that she drinks about 0.5 oz of alcohol per week. She reports that she does not use drugs.  Allergies: No Known Allergies  Home Medications:  Current Meds  Medication Sig  . amLODipine (NORVASC) 5 MG tablet Take 5 mg by mouth at bedtime.   Marland Kitchen aspirin 81 MG tablet Take 81 mg by mouth daily.    . empagliflozin (JARDIANCE) 10 MG TABS tablet Take 10 mg by mouth daily.  . enalapril (VASOTEC) 20 MG tablet Take 20 mg by mouth 2 (two) times daily.   Marland Kitchen ENTRESTO 97-103 MG Take 1 tablet by mouth 2 (two) times daily.   . famotidine (PEPCID) 20 MG tablet Take 20 mg  by mouth as needed.   . hydrochlorothiazide (HYDRODIURIL) 25 MG tablet Take 25 mg by mouth daily.   . indapamide (LOZOL) 1.25 MG tablet Take 1.25 mg by mouth daily.   . metFORMIN (GLUCOPHAGE) 1000 MG tablet Take 1,000 mg by mouth 2 (two) times daily with a meal.    . NIFEdipine (PROCARDIA-XL/ADALAT CC) 60 MG 24 hr tablet Take 60 mg by mouth 2 (two) times daily.   Marland Kitchen nystatin cream (MYCOSTATIN) Apply topically 2 (two) times daily. (Patient taking differently: Apply 1 application topically as needed. )  . rosuvastatin (CRESTOR) 10 MG tablet Take 10 mg by mouth daily.      ROS: As per HPI.   Physical Examination: Blood pressure (!) 191/95, pulse 87, temperature 98.4 F (36.9 C), temperature source Oral, resp. rate 18, SpO2 96 %.   HEENT: [place]/AT Lungs: Respirations unlabored Ext: Warm and well perfused  Neurologic Examination: Mental Status: Alert, oriented, thought content appropriate.  Speech fluent without evidence of aphasia.  Able to follow all commands without difficulty. Cranial Nerves: II:  Visual fields without extinction to DSS. PERRL.  III,IV, VI: Ptosis not present, EOMI without nystagmus V,VII: Smile symmetric, facial temp sensation normal bilaterally VIII: hearing intact to voice IX,X: No hypophonia XI: Symmetric  XII: midline tongue extension  Motor: RUE 5/5 LUE 5/5 RLE 5/5 LLE 4/5 except for 5/5 ankle dorsiflexion Sensory: Temp and light touch intact x 4 Deep Tendon Reflexes:  Upper extremities 1+ bilaterally Unable to elicit patellar or achilles reflexes Cerebellar: No ataxia with FNF bilaterally Gait: Deferred   Results for orders placed or performed during the hospital encounter of 03/15/18 (from the past 48 hour(s))  Basic metabolic panel     Status: Abnormal   Collection Time: 03/15/18 11:33 AM  Result Value Ref Range   Sodium 139 135 - 145 mmol/L   Potassium 3.6 3.5 - 5.1 mmol/L   Chloride 103 101 - 111 mmol/L   CO2 25 22 - 32 mmol/L   Glucose, Bld  216 (H) 65 - 99 mg/dL   BUN 17 6 - 20 mg/dL   Creatinine, Ser 1.08 (H) 0.44 - 1.00 mg/dL   Calcium 8.6 (L) 8.9 - 10.3 mg/dL   GFR calc non Af Amer 52 (L) >60 mL/min   GFR calc Af Amer >60 >60 mL/min    Comment: (NOTE) The eGFR has been calculated using the CKD EPI equation. This calculation has not been validated in all clinical situations. eGFR's persistently <60 mL/min signify possible Chronic Kidney Disease.    Anion gap 11 5 - 15    Comment: Performed at [facility] [address], [place] [address]  CBC     Status: None   Collection Time: 03/15/18 11:33 AM  Result Value Ref Range   WBC 6.7 4.0 - 10.5 K/uL   RBC 4.22 3.87 - 5.11 MIL/uL   Hemoglobin 12.9 12.0 - 15.0 g/dL   HCT 39.7 36.0 - 46.0 %   MCV 94.1 78.0 - 100.0 fL   MCH 30.6 26.0 - 34.0 pg   MCHC 32.5 30.0 - 36.0 g/dL   RDW 14.2 11.5 - 15.5 %   Platelets 286 150 - 400 K/uL    Comment: Performed at [facility], [place] [address] [place], [place] [address]  Urinalysis, Routine w reflex microscopic     Status: Abnormal   Collection Time: 03/15/18  5:20 PM  Result Value Ref Range   Color, Urine YELLOW YELLOW   APPearance CLEAR CLEAR   Specific Gravity, Urine 1.015 1.005 - 1.030   pH 7.0 5.0 - 8.0   Glucose, UA NEGATIVE NEGATIVE mg/dL   Hgb urine dipstick NEGATIVE NEGATIVE   Bilirubin Urine NEGATIVE NEGATIVE   Ketones, ur NEGATIVE NEGATIVE mg/dL   Protein, ur 100 (A) NEGATIVE mg/dL   Nitrite NEGATIVE NEGATIVE   Leukocytes, UA NEGATIVE NEGATIVE   RBC / HPF 0-5 0 - 5 RBC/hpf   WBC, UA 0-5 0 - 5 WBC/hpf   Bacteria, UA RARE (A) NONE SEEN   Squamous Epithelial / LPF 0-5 0 - 5    Comment: Please note change in reference range.   Mucus PRESENT     Comment: Performed at [facility], [place] [address] [place],  [address]  Hepatic function panel     Status: Abnormal   Collection Time: 03/15/18  6:06 PM  Result Value Ref Range   Total Protein 7.1 6.5 - 8.1 g/dL   Albumin 3.5 3.5 - 5.0  g/dL   AST 11 (L) 15 - 41 U/L   ALT 12 (L) 14 - 54 U/L   Alkaline Phosphatase 90 38 - 126 U/L   Total Bilirubin 0.9 0.3 - 1.2 mg/dL   Bilirubin, Direct <0.1 (L)  0.1 - 0.5 mg/dL   Indirect Bilirubin NOT CALCULATED 0.3 - 0.9 mg/dL    Comment: Performed at [facility], [place] [address] [place], [place] [address]  CK     Status: None   Collection Time: 03/15/18  6:06 PM  Result Value Ref Range   Total CK 53 38 - 234 U/L    Comment: Performed at [facility], [place] [address], [place], [place] [address]  Brain natriuretic peptide     Status: Abnormal   Collection Time: 03/15/18  6:06 PM  Result Value Ref Range   B Natriuretic Peptide 146.6 (H) 0.0 - 100.0 pg/mL    Comment: Performed at [facility] [address], [place], [place] [address]  I-stat troponin, ED     Status: None   Collection Time: 03/15/18  6:13 PM  Result Value Ref Range   Troponin i, poc 0.01 0.00 - 0.08 ng/mL   Comment 3            Comment: Due to the release kinetics of cTnI, a negative result within the first hours of the onset of symptoms does not rule out myocardial infarction with certainty. If myocardial infarction is still suspected, repeat the test at appropriate intervals.    Dg Chest 2 View  Result Date: 03/15/2018 CLINICAL DATA:  Chest pain EXAM: CHEST - 2 VIEW COMPARISON:  02/17/2011 FINDINGS: No pleural effusion. Moderate cardiomegaly increased compared to prior. No pleural effusion or focal consolidation. Mild vascular congestion. No pneumothorax. IMPRESSION: Moderate cardiomegaly with vascular congestion, increased compared to prior from 2012. Could consider component of pericardial effusion. Electronically Signed   By: Donavan Foil M.D.   On: 03/15/2018 19:01   Dg Lumbar Spine Complete  Result Date: 03/15/2018 CLINICAL DATA:  Fall EXAM: LUMBAR SPINE - COMPLETE 4+ VIEW COMPARISON:  CT 03/28/2018 FINDINGS: Lumbar alignment within normal limits. Mild degenerative changes at L3-L4 and  L5-S1. Questionable mild superior endplate deformity at L1. Aortic atherosclerosis. IMPRESSION: 1. Possible mild superior endplate deformity at L1 2. Degenerative changes. Electronically Signed   By: Donavan Foil M.D.   On: 03/15/2018 19:03   Mr Brain Wo Contrast  Result Date: 03/15/2018 CLINICAL DATA:  67 y/o F; generalized weakness, lightheadedness, and multiple falls. EXAM: MRI HEAD WITHOUT CONTRAST TECHNIQUE: Multiplanar, multiecho pulse sequences of the brain and surrounding structures were obtained without intravenous contrast. COMPARISON:  None. FINDINGS: Brain: 5 mm focus of reduced diffusion within the right mid corona radiata compatible with acute/early subacute infarction. No hemorrhage or mass effect. Very small chronic infarctions are present within the right cerebellar hemisphere and mid pons. Chronic lacunar infarctions are present within the left anterior limb of internal capsule and thalamus. Several patchynonspecific foci of T2 FLAIR hyperintense signal abnormality in subcortical and periventricular white matter are compatible withmoderatechronic microvascular ischemic changes for age. Moderatebrain parenchymal volume loss. Punctate foci of susceptibility hypointensity are present within the bilateral basal ganglia compatible with hemosiderin deposition of chronic microhemorrhage. Vascular: Normal flow voids. Skull and upper cervical spine: Normal marrow signal. Sinuses/Orbits: Small right maxillary sinus mucous retention cyst. Otherwise negative. 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 IMPRESSION: 1. 5 mm acute/early subacute infarction within the right mid corona radiata. No associated hemorrhage or mass effect. 2. Moderate for age chronic microvascular ischemic changes and parenchymal volume loss of the brain. 3. Small chronic infarctions in the right cerebellum, mid pons, and left basal ganglia. 4. Foci of chronic microhemorrhage in  central  distribution likely related to chronic hypertension. These results were called by telephone at the time of interpretation on 03/15/2018 at 9:56 pm to Dr. Gareth Morgan , who verbally acknowledged these results. Electronically Signed   By: Kristine Garbe M.D.   On: 03/15/2018 21:58    Assessment: 67 y.o. female presenting with generalized weakness and lightheadedness. MRI brain reveals subcentimeter acute right corona radiata infarction 1. Exam reveals LLE weakness. The patient states this is chronic due to prior revascularization procedure, but there may be a new component from the stroke, which is within the white matter tracts in the leg region of the motor homunculus 2. Stroke Risk Factors - DM, HLD, HTN, PAD and obesity. 3. Not classifiable as having failed ASA monotherapy, as she is semi-compliant with this medication at home  Plan: 1. HgbA1c, fasting lipid panel 2. MRA of the brain without contrast 3. PT consult, OT consult, Speech consult 4. Echocardiogram 5. Carotid dopplers 6. Continue ASA 81 mg po qd 7. Risk factor modification 8. Telemetry monitoring 9. Frequent neuro checks 10. Switch statin to atorvastatin 40 mg po qd. 11. Permissive HTN x 24 hours  _0  signed: Dr. Kerney Elbe 03/15/2018, 11:54 PM

## 2018-03-15 NOTE — ED Triage Notes (Signed)
Pt presents for evaluation of generalized weakness. States she feels lightheaded, has fallen twice in past few days. Denies syncope. States was having difficulty getting out of tub.

## 2018-03-15 NOTE — ED Notes (Addendum)
Attempted report x1. 

## 2018-03-15 NOTE — H&P (Signed)
History and Physical    [name]:[id number] DOB: Feb 08, 1951 DOA: 03/15/2018  Referring MD/NP/PA: Dr. Darcus Austin PCP: Iona Beard, MD  Patient coming from: home   Chief Complaint: Dizziness  I have personally briefly reviewed patient's old medical records in [place]   HPI: Alicia [name] is a 67 y.o. female with medical history significant of HTN, HLD, obesity, PAD, DM type II; who presents with complaints of dizziness and lightheadedness.  Patient associates onset of symptoms after being started on multiple different blood pressure medications 2-3 months ago by her primary care provider.  At baseline the patient walks with the use of a cane.  However, over last 2 days patient reports symptoms gone to the point which she was not able to ambulate without grabbing onto things around her along with using her cane to keep from falling.  Associated symptoms include shortness of breath, leg swelling, back pain, 2 falls sometime last week.  Denies any significant trauma to her head or loss of consciousness.  After she had the 2 falls patient reports stopping all of her blood pressure medications over the last 4 to 5 days.    ED Course: This department patient was noted to be afebrile, pulse 44-98, respirations 16-25, blood pressure elevated up to 220/105, and O2 saturations 94 to 99% on room air.  Orthostatic vital signs are noted to be negative.  Labs revealed creatinine 1.08, glucose 216, BNP 146.6, and troponin 0.01.  Chest x-ray showed signs of cardiomegaly with vascular congestion increased from previous and imaging of the lumbar spine showed possible mild endplate deformity at L1  Urinalysis was negative for signs of infection.  Patient received 10 mg of labetalol IV, hydralazine 25 mg p.o., and amlodipine 5 mg for blood pressure.  A MRI initially was obtained which showed signs of a subacute right mid corona radiata infarct with other chronic changes.  Neurology was  consulted at that time.  Patient was out of the window for any acute intervention.  Review of Systems  Constitutional: Positive for malaise/fatigue. Negative for chills, fever and weight loss.  HENT: Negative for ear discharge and ear pain.   Eyes: Negative for blurred vision and double vision.  Respiratory: Positive for shortness of breath. Negative for cough.   Cardiovascular: Positive for chest pain and leg swelling.  Gastrointestinal: Positive for vomiting. Negative for diarrhea.  Musculoskeletal: Positive for back pain.  Neurological: Positive for dizziness. Negative for loss of consciousness.  Psychiatric/Behavioral: Negative for memory loss and substance abuse.    Past Medical History:  Diagnosis Date  . Arthritis   . Chronic headaches   . Constipation   . Depression   . Diabetes mellitus   . Hyperlipidemia   . Hypertension   . Obesity   . PAD (peripheral artery disease) ([place])   . Shortness of breath     Past Surgical History:  Procedure Laterality Date  . FEMORAL BYPASS  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 Bridgett Larsson     reports that she has been smoking cigarettes.  She has a 17.50 pack-year smoking history. She has never used smokeless tobacco. She reports that she drinks about 0.5 oz of alcohol per week. She reports that she does not use drugs.  No Known Allergies  Family History  Problem Relation Age of Onset  . Diabetes Father   . Heart disease Mother        NOT before age 69-  Bypass  . Hypertension Mother   . Hypertension Sister   . Varicose Veins Brother   . Heart disease Brother        Before age 75  . Hypertension Daughter     Prior to Admission medications   Medication Sig Start Date End Date Taking? Authorizing Provider  amLODipine (NORVASC) 5 MG tablet Take 5 mg by mouth at bedtime.  01/11/18  Yes [provider]  aspirin 81 MG tablet Take 81 mg by mouth daily.     Yes [provider]    empagliflozin (JARDIANCE) 10 MG TABS tablet Take 10 mg by mouth daily.   Yes [provider]  enalapril (VASOTEC) 20 MG tablet Take 20 mg by mouth 2 (two) times daily.  05/24/14  Yes [provider]  ENTRESTO 97-103 MG Take 1 tablet by mouth 2 (two) times daily.  01/24/18  Yes [provider]  famotidine (PEPCID) 20 MG tablet Take 20 mg by mouth as needed.    Yes [provider]  hydrochlorothiazide (HYDRODIURIL) 25 MG tablet Take 25 mg by mouth daily.  01/26/18  Yes [provider]  indapamide (LOZOL) 1.25 MG tablet Take 1.25 mg by mouth daily.  01/26/18  Yes [provider]  metFORMIN (GLUCOPHAGE) 1000 MG tablet Take 1,000 mg by mouth 2 (two) times daily with a meal.     Yes [provider]  NIFEdipine (PROCARDIA-XL/ADALAT CC) 60 MG 24 hr tablet Take 60 mg by mouth 2 (two) times daily.  03/15/14  Yes [provider]  nystatin cream (MYCOSTATIN) Apply topically 2 (two) times daily. Patient taking differently: Apply 1 application topically as needed.  07/29/13  Yes Carvel Getting, NP  rosuvastatin (CRESTOR) 10 MG tablet Take 10 mg by mouth daily.  01/26/18  Yes [provider]  cyclobenzaprine (FLEXERIL) 10 MG tablet Take 1 tablet (10 mg total) by mouth 2 (two) times daily as needed for muscle spasms. Patient not taking: Reported on 03/24/2017 02/02/13   Elyn Peers, MD  diazepam (VALIUM) 5 MG tablet Take 1 tablet (5 mg total) by mouth 2 (two) times daily. Patient not taking: Reported on 03/24/2017 07/29/13   Carvel Getting, NP  ibuprofen (ADVIL,MOTRIN) 800 MG tablet Take 1 tablet (800 mg total) by mouth 3 (three) times daily. Patient not taking: Reported on 02/06/2018 07/29/13   Carvel Getting, NP  metroNIDAZOLE (FLAGYL) 250 MG tablet Take 1 tablet (250 mg total) by mouth 3 (three) times daily. Patient not taking: Reported on 02/06/2018 07/31/13   Billy Fischer, MD  nystatin (MYCOSTATIN/NYSTOP) 100000 UNIT/GM POWD Apply to dry skin  folds twice daily Patient not taking: Reported on 03/15/2018 07/29/13   Carvel Getting, NP  oxyCODONE-acetaminophen (PERCOCET) 5-325 MG per tablet Take 2 tablets by mouth every 4 (four) hours as needed for pain. Patient not taking: Reported on 03/24/2017 02/02/13   Elyn Peers, MD  oxyCODONE-acetaminophen (PERCOCET/ROXICET) 5-325 MG per tablet Take 2 tablets by mouth every 6 (six) hours as needed for pain. Patient not taking: Reported on 03/24/2017 02/11/13   Cleatrice Burke, PA-C  oxyCODONE-acetaminophen (PERCOCET/ROXICET) 5-325 MG per tablet Take 1 tablet by mouth every 6 (six) hours as needed for pain. Patient not taking: Reported on 03/24/2017 07/29/13   Carvel Getting, NP  predniSONE (DELTASONE) 20 MG tablet 3 tabs po daily x 3 days, then 2 tabs x 3 days, then 1.5 tabs x 3 days, then 1 tab x 3 days, then 0.5 tabs x 3  days Patient not taking: Reported on 03/24/2017 02/11/13   Cleatrice Burke, PA-C    Physical Exam:  Constitutional: Obese female who appears to be no acute distress at this time Vitals:   03/15/18 1900 03/15/18 1915 03/15/18 2000 03/15/18 2100  BP: (!) 198/102 (!) 210/184 (!) 195/91 (!) 175/82  Pulse: 75 79 87 78  Resp: (!) 24 (!) 25 (!) 23 (!) 25  Temp:      TempSrc:      SpO2: 96% 97% 97% 94%   Eyes: PERRL, lids and conjunctivae normal ENMT: Mucous membranes are moist. Posterior pharynx clear of any exudate or lesions. .  Neck: normal, supple, no masses, no thyromegaly  Respiratory: Tachypneic with decreased overall aeration noted and bibasilar crackles appreciated.  Patient able to talk nearly complete sentences on room air. Cardiovascular: Regular rate and rhythm, no murmurs / rubs / gallops.  +1 pitting lower extremity edema. 2+ pedal pulses. No carotid bruits.  Abdomen: no tenderness, no masses palpated. No hepatosplenomegaly. Bowel sounds positive.  Musculoskeletal: no clubbing / cyanosis. No joint deformity upper and lower extremities. Good ROM, no contractures. Normal  muscle tone.  Skin: no rashes, lesions, ulcers. No induration Neurologic: CN 2-12 grossly intact. Sensation intact, DTR normal. Strength 5/5 in all 4.  Psychiatric: Normal judgment and insight. Alert and oriented x 3. Normal mood.     Labs on Admission: I have personally reviewed following labs and imaging studies  CBC: Recent Labs  Lab 03/15/18 1133  WBC 6.7  HGB 12.9  HCT 39.7  MCV 94.1  PLT 732   Basic Metabolic Panel: Recent Labs  Lab 03/15/18 1133  NA 139  K 3.6  CL 103  CO2 25  GLUCOSE 216*  BUN 17  CREATININE 1.08*  CALCIUM 8.6*   GFR: CrCl cannot be calculated (Unknown ideal weight.). Liver Function Tests: Recent Labs  Lab 03/15/18 1806  AST 11*  ALT 12*  ALKPHOS 90  BILITOT 0.9  PROT 7.1  ALBUMIN 3.5   No results for input(s): LIPASE, AMYLASE in the last 168 hours. No results for input(s): AMMONIA in the last 168 hours. Coagulation Profile: No results for input(s): INR, PROTIME in the last 168 hours. Cardiac Enzymes: Recent Labs  Lab 03/15/18 1806  CKTOTAL 53   BNP (last 3 results) No results for input(s): PROBNP in the last 8760 hours. HbA1C: No results for input(s): HGBA1C in the last 72 hours. CBG: No results for input(s): GLUCAP in the last 168 hours. Lipid Profile: No results for input(s): CHOL, HDL, LDLCALC, TRIG, CHOLHDL, LDLDIRECT in the last 72 hours. Thyroid Function Tests: No results for input(s): TSH, T4TOTAL, FREET4, T3FREE, THYROIDAB in the last 72 hours. Anemia Panel: No results for input(s): VITAMINB12, FOLATE, FERRITIN, TIBC, IRON, RETICCTPCT in the last 72 hours. Urine analysis:    Component Value Date/Time   COLORURINE YELLOW 03/15/2018 1720   APPEARANCEUR CLEAR 03/15/2018 1720   LABSPEC 1.015 03/15/2018 1720   PHURINE 7.0 03/15/2018 1720   GLUCOSEU NEGATIVE 03/15/2018 1720   HGBUR NEGATIVE 03/15/2018 1720   BILIRUBINUR NEGATIVE 03/15/2018 1720   KETONESUR NEGATIVE 03/15/2018 1720   PROTEINUR 100 (A) 03/15/2018  1720   UROBILINOGEN 4.0 (H) 07/29/2013 1231   NITRITE NEGATIVE 03/15/2018 1720   LEUKOCYTESUR NEGATIVE 03/15/2018 1720   Sepsis Labs: No results found for this or any previous visit (from the past 240 hour(s)).   Radiological Exams on Admission: Dg Chest 2 View  Result Date: 03/15/2018 CLINICAL DATA:  Chest pain EXAM: CHEST -  2 VIEW COMPARISON:  02/17/2011 FINDINGS: No pleural effusion. Moderate cardiomegaly increased compared to prior. No pleural effusion or focal consolidation. Mild vascular congestion. No pneumothorax. IMPRESSION: Moderate cardiomegaly with vascular congestion, increased compared to prior from 2012. Could consider component of pericardial effusion. Electronically Signed   By: Donavan Foil M.D.   On: 03/15/2018 19:01   Dg Lumbar Spine Complete  Result Date: 03/15/2018 CLINICAL DATA:  Fall EXAM: LUMBAR SPINE - COMPLETE 4+ VIEW COMPARISON:  CT 03/28/2018 FINDINGS: Lumbar alignment within normal limits. Mild degenerative changes at L3-L4 and L5-S1. Questionable mild superior endplate deformity at L1. Aortic atherosclerosis. IMPRESSION: 1. Possible mild superior endplate deformity at L1 2. Degenerative changes. Electronically Signed   By: Donavan Foil M.D.   On: 03/15/2018 19:03   Mr Brain Wo Contrast  Result Date: 03/15/2018 CLINICAL DATA:  67 y/o F; generalized weakness, lightheadedness, and multiple falls. EXAM: MRI HEAD WITHOUT CONTRAST TECHNIQUE: Multiplanar, multiecho pulse sequences of the brain and surrounding structures were obtained without intravenous contrast. COMPARISON:  None. FINDINGS: Brain: 5 mm focus of reduced diffusion within the right mid corona radiata compatible with acute/early subacute infarction. No hemorrhage or mass effect. Very small chronic infarctions are present within the right cerebellar hemisphere and mid pons. Chronic lacunar infarctions are present within the left anterior limb of internal capsule and thalamus. Several patchynonspecific foci of  T2 FLAIR hyperintense signal abnormality in subcortical and periventricular white matter are compatible withmoderatechronic microvascular ischemic changes for age. Moderatebrain parenchymal volume loss. Punctate foci of susceptibility hypointensity are present within the bilateral basal ganglia compatible with hemosiderin deposition of chronic microhemorrhage. Vascular: Normal flow voids. Skull and upper cervical spine: Normal marrow signal. Sinuses/Orbits: Small right maxillary sinus mucous retention cyst. Otherwise negative. 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 IMPRESSION: 1. 5 mm acute/early subacute infarction within the right mid corona radiata. No associated hemorrhage or mass effect. 2. Moderate for age chronic microvascular ischemic changes and parenchymal volume loss of the brain. 3. Small chronic infarctions in the right cerebellum, mid pons, and left basal ganglia. 4. Foci of chronic microhemorrhage in central distribution likely related to chronic hypertension. These results were called by telephone at the time of interpretation on 03/15/2018 at 9:56 pm to Dr. Gareth Morgan , who verbally acknowledged these results. Electronically Signed   By: Kristine Garbe M.D.   On: 03/15/2018 21:58    EKG: Independently reviewed.  sinus rhythm  At 97 bpm with biatrial  enlargement and LVH.  Assessment/Plan CVA: Subacute.  Patient presents with complaints dizziness.  MRI showing signs of a subacute right mid corona radiata infarct.  Neurology consulted. Risk factors include HTN, obesity, DM type II - Admit to telemetry bed - Stroke order set initiated - Neuro checks  - PT/OT/Speech to eval and treat - Check echocardiogram and Vas carotid U/S - Check Hemoglobin A1c and lipid panel in a.m. - ASA  - Social work consult - [facility] consultative services, will follow-up for further recommendations  Hypertensive urgency: Acute.  Patient's  initial blood pressures noted to be elevated up to 220/105 on admission.  Patient - Held hydrochlorothiazide as diuresis with IV Lasix - Continue amlodipine, enalapril, and Entresto - Hydralazine IV as needed as for elevated blood pressures  Suspect CHF exacerbation: Patient presents with complaints of shortness of breath and recent discontinuation of medications including hydrochlorothiazide.  +1 pitting edema lower extremity and chest x-ray showing increased vascular congestion.  BNP 146.6 in obese patient.  Patient without previous echocardiogram. - Strict I&O's and daily weights - Follow-up echocardiogram - May warrant consult cardiology in a.m.  Chest pain: Patient reports having intermittent chest discomfort. -Trend troponin  Diabetes mellitus type 2: On admission patient blood glucose elevated at 216.  Last available hemoglobin A1c noted to be 7.8 back in 2012. - Hypoglycemic protocol - Follow-up hemoglobin A1c in a.m. - Hold metformin - CBGs q. before meals and at bedtime with sensitive SSI  Peripheral vascular disease - ASA  Renal insufficiency: Stable.  Creatinine appears similar to 1 year ago on admission. - Continue to monitor  Hyperlipidemia - Continue Crestor patient would benefit from being placed on increased dose   Obesity: BMI 38.92  DVT prophylaxis: lovenox   Code Status: Full  Family Communication: no family present at bedside Disposition Plan: TBD  Consults called: Neurology  Admission status: Inpatient  Norval Morton MD [facility] Pager [phone]   If 7PM-7AM, please contact night-coverage [email or web address] Password [email or web address]  03/15/2018, 10:30 PM

## 2018-03-15 NOTE — ED Provider Notes (Signed)
[facility] Provider Note   CSN: [id number] Arrival date & time: 03/15/18  1050     History   Chief Complaint Chief Complaint  Patient presents with  . Weakness    HPI Alicia [name] is a 67 y.o. female.  HPI   67yo female presenting with increasing fatigue, lightheadedness/pre-syncope and sensation of feeling off balance.  Lightheadedness worse from sitting to standing Last fall getting out of tub 7 days ago, not able to stand, crawled and laid on ground then used walker and able to get around PCP changed meds 3 mos ago, started on statin. Was on just Ca channel blocker and was started 4 antihypertensives--hctz, enalipril, norvasc, entresto  Thought falls and lightheadedness were from the meds, so self-discontinued 5 days ago, yesterday took old Ca channel blocker only.  Tuesday did not take any medications and felt worse. Reports feeling off balanced, has to hold onto things when walking.  No numbness/weakness focallly. No fevers. No visual changes or speech changes.    Past Medical History:  Diagnosis Date  . Arthritis   . Chronic headaches   . Constipation   . Depression   . Diabetes mellitus   . Hyperlipidemia   . Hypertension   . Obesity   . PAD (peripheral artery disease) ([place])   . Shortness of breath     Patient Active Problem List   Diagnosis Date Noted  . CHF exacerbation ([place]) 03/16/2018  . Renal insufficiency 03/16/2018  . Hypertensive urgency 03/16/2018  . Diabetes mellitus type 2 in obese ([place]) 03/16/2018  . CVA (cerebral vascular accident) ([place]) 03/15/2018  . Numbness in both hands- and Feet 06/06/2014  . Weakness of both legs 06/06/2014  . Swelling of limb-Left Leg > Right 06/06/2014  . Peripheral vascular disease, unspecified ([place]) 11/30/2012  . Pain in limb 11/30/2012  . Atherosclerosis of native arteries of the extremities with intermittent claudication 04/06/2012  . CHEST PAIN 01/03/2011  . DYSLIPIDEMIA 07/20/2007    . HYPERTENSION 07/20/2007  . ARTHRITIS 07/20/2007    Past Surgical History:  Procedure Laterality Date  . FEMORAL BYPASS  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 Bridgett Larsson     OB History   None      Home Medications    Prior to Admission medications   Medication Sig Start Date End Date Taking? Authorizing Provider  amLODipine (NORVASC) 5 MG tablet Take 5 mg by mouth at bedtime.  01/11/18  Yes [provider]  aspirin 81 MG tablet Take 81 mg by mouth daily.     Yes [provider]  empagliflozin (JARDIANCE) 10 MG TABS tablet Take 10 mg by mouth daily.   Yes [provider]  enalapril (VASOTEC) 20 MG tablet Take 20 mg by mouth 2 (two) times daily.  05/24/14  Yes [provider]  ENTRESTO 97-103 MG Take 1 tablet by mouth 2 (two) times daily.  01/24/18  Yes [provider]  famotidine (PEPCID) 20 MG tablet Take 20 mg by mouth as needed.    Yes [provider]  hydrochlorothiazide (HYDRODIURIL) 25 MG tablet Take 25 mg by mouth daily.  01/26/18  Yes [provider]  indapamide (LOZOL) 1.25 MG tablet Take 1.25 mg by mouth daily.  01/26/18  Yes [provider]  metFORMIN (GLUCOPHAGE) 1000 MG tablet Take 1,000 mg by mouth 2 (two) times daily with a meal.     Yes [provider]  NIFEdipine (PROCARDIA-XL/ADALAT  CC) 60 MG 24 hr tablet Take 60 mg by mouth 2 (two) times daily.  03/15/14  Yes [provider]  nystatin cream (MYCOSTATIN) Apply topically 2 (two) times daily. Patient taking differently: Apply 1 application topically as needed.  07/29/13  Yes Carvel Getting, NP  rosuvastatin (CRESTOR) 10 MG tablet Take 10 mg by mouth daily.  01/26/18  Yes [provider]  cyclobenzaprine (FLEXERIL) 10 MG tablet Take 1 tablet (10 mg total) by mouth 2 (two) times daily as needed for muscle spasms. Patient not taking: Reported on 03/24/2017 02/02/13   Elyn Peers, MD  diazepam (VALIUM) 5 MG  tablet Take 1 tablet (5 mg total) by mouth 2 (two) times daily. Patient not taking: Reported on 03/24/2017 07/29/13   Carvel Getting, NP  ibuprofen (ADVIL,MOTRIN) 800 MG tablet Take 1 tablet (800 mg total) by mouth 3 (three) times daily. Patient not taking: Reported on 02/06/2018 07/29/13   Carvel Getting, NP  metroNIDAZOLE (FLAGYL) 250 MG tablet Take 1 tablet (250 mg total) by mouth 3 (three) times daily. Patient not taking: Reported on 02/06/2018 07/31/13   Billy Fischer, MD  nystatin (MYCOSTATIN/NYSTOP) 100000 UNIT/GM POWD Apply to dry skin folds twice daily Patient not taking: Reported on 03/15/2018 07/29/13   Carvel Getting, NP  oxyCODONE-acetaminophen (PERCOCET) 5-325 MG per tablet Take 2 tablets by mouth every 4 (four) hours as needed for pain. Patient not taking: Reported on 03/24/2017 02/02/13   Elyn Peers, MD  oxyCODONE-acetaminophen (PERCOCET/ROXICET) 5-325 MG per tablet Take 2 tablets by mouth every 6 (six) hours as needed for pain. Patient not taking: Reported on 03/24/2017 02/11/13   Cleatrice Burke, PA-C  oxyCODONE-acetaminophen (PERCOCET/ROXICET) 5-325 MG per tablet Take 1 tablet by mouth every 6 (six) hours as needed for pain. Patient not taking: Reported on 03/24/2017 07/29/13   Carvel Getting, NP  predniSONE (DELTASONE) 20 MG tablet 3 tabs po daily x 3 days, then 2 tabs x 3 days, then 1.5 tabs x 3 days, then 1 tab x 3 days, then 0.5 tabs x 3 days Patient not taking: Reported on 03/24/2017 02/11/13   Cleatrice Burke, PA-C    Family History Family History  Problem Relation Age of Onset  . Diabetes Father   . Heart disease Mother        NOT before age 7-  Bypass  . Hypertension Mother   . Hypertension Sister   . Varicose Veins Brother   . Heart disease Brother        Before age 74  . Hypertension Daughter     Social History Social History   Tobacco Use  . Smoking status: Light Tobacco Smoker    Packs/day: 0.50    Years: 35.00    Pack years: 17.50    Types: Cigarettes  .  Smokeless tobacco: Never Used  . Tobacco comment: patient is trying to quit and has gone down to half a pack per day  Substance Use Topics  . Alcohol use: Yes    Alcohol/week: 0.5 oz    Types: 1 Standard drinks or equivalent per week    Comment: occasional use only  . Drug use: No     Allergies   Patient has no known allergies.   Review of Systems Review of Systems  Constitutional: Positive for fatigue. Negative for fever.  HENT: Negative for sore throat.   Eyes: Negative for visual disturbance.  Respiratory: Negative for cough and shortness of breath (previously but resolving).   Cardiovascular:  Positive for chest pain (sharp 4/10, resolves spontaneously, starts spontaneously).  Gastrointestinal: Negative for abdominal pain, constipation, diarrhea, nausea and vomiting.  Genitourinary: Negative for difficulty urinating and dysuria.  Musculoskeletal: Negative for back pain and neck pain.  Skin: Negative for rash.  Neurological: Positive for dizziness and light-headedness. Negative for syncope, facial asymmetry, weakness, numbness and headaches.     Physical Exam Updated Vital Signs BP (!) 188/97 (BP Location: Left Arm)   Pulse 73   Temp 98.3 F (36.8 C) (Oral)   Resp 20   Ht 5\' 8"  (1.727 m)   Wt 116.1 kg (255 lb 15.3 oz)   SpO2 98%   BMI 38.92 kg/m   Physical Exam  Constitutional: She is oriented to person, place, and time. She appears well-developed and well-nourished. No distress.  HENT:  Head: Normocephalic and atraumatic.  Eyes: Conjunctivae and EOM are normal.  Neck: Normal range of motion.  Cardiovascular: Normal rate, regular rhythm, normal heart sounds and intact distal pulses. Exam reveals no gallop and no friction rub.  No murmur heard. Pulmonary/Chest: Effort normal and breath sounds normal. No respiratory distress. She has no wheezes. She has no rales.  Abdominal: Soft. She exhibits no distension. There is no tenderness. There is no guarding.    Musculoskeletal: She exhibits no edema or tenderness.  Neurological: She is alert and oriented to person, place, and time. She has normal strength. Gait (with pain and cane) normal. GCS eye subscore is 4. GCS verbal subscore is 5. GCS motor subscore is 6.  Skin: Skin is warm and dry. No rash noted. She is not diaphoretic. No erythema.  Nursing note and vitals reviewed.    ED Treatments / Results  Labs (all labs ordered are listed, but only abnormal results are displayed) Labs Reviewed  BASIC METABOLIC PANEL - Abnormal; Notable for the following components:      Result Value   Glucose, Bld 216 (*)    Creatinine, Ser 1.08 (*)    Calcium 8.6 (*)    GFR calc non Af Amer 52 (*)    All other components within normal limits  HEPATIC FUNCTION PANEL - Abnormal; Notable for the following components:   AST 11 (*)    ALT 12 (*)    Bilirubin, Direct <0.1 (*)    All other components within normal limits  URINALYSIS, ROUTINE W REFLEX MICROSCOPIC - Abnormal; Notable for the following components:   Protein, ur 100 (*)    Bacteria, UA RARE (*)    All other components within normal limits  BRAIN NATRIURETIC PEPTIDE - Abnormal; Notable for the following components:   B Natriuretic Peptide 146.6 (*)    All other components within normal limits  HEMOGLOBIN A1C - Abnormal; Notable for the following components:   Hgb A1c MFr Bld 7.7 (*)    All other components within normal limits  BASIC METABOLIC PANEL - Abnormal; Notable for the following components:   Glucose, Bld 231 (*)    Creatinine, Ser 1.16 (*)    Calcium 8.6 (*)    GFR calc non Af Amer 48 (*)    GFR calc Af Amer 55 (*)    All other components within normal limits  LIPID PANEL - Abnormal; Notable for the following components:   LDL Cholesterol 112 (*)    All other components within normal limits  GLUCOSE, CAPILLARY - Abnormal; Notable for the following components:   Glucose-Capillary 183 (*)    All other components within normal limits   URINE CULTURE  CBC  CK  TROPONIN I  TROPONIN I  TROPONIN I  I-STAT TROPONIN, ED    EKG EKG Interpretation  Date/Time:  Thursday March 15 2018 11:15:41 EDT Ventricular Rate:  97 PR Interval:  144 QRS Duration: 88 QT Interval:  378 QTC Calculation: 480 R Axis:   -31 Text Interpretation:  Normal sinus rhythm Biatrial enlargement Left axis deviation Pulmonary disease pattern Left ventricular hypertrophy Nonspecific ST abnormality Abnormal ECG No significant change since last tracing Confirmed by Gareth Morgan [phone]) on 03/15/2018 3:42:15 PM   Radiology Dg Chest 2 View  Result Date: 03/15/2018 CLINICAL DATA:  Chest pain EXAM: CHEST - 2 VIEW COMPARISON:  02/17/2011 FINDINGS: No pleural effusion. Moderate cardiomegaly increased compared to prior. No pleural effusion or focal consolidation. Mild vascular congestion. No pneumothorax. IMPRESSION: Moderate cardiomegaly with vascular congestion, increased compared to prior from 2012. Could consider component of pericardial effusion. Electronically Signed   By: Donavan Foil M.D.   On: 03/15/2018 19:01   Dg Lumbar Spine Complete  Result Date: 03/15/2018 CLINICAL DATA:  Fall EXAM: LUMBAR SPINE - COMPLETE 4+ VIEW COMPARISON:  CT 03/28/2018 FINDINGS: Lumbar alignment within normal limits. Mild degenerative changes at L3-L4 and L5-S1. Questionable mild superior endplate deformity at L1. Aortic atherosclerosis. IMPRESSION: 1. Possible mild superior endplate deformity at L1 2. Degenerative changes. Electronically Signed   By: Donavan Foil M.D.   On: 03/15/2018 19:03   Mr Brain Wo Contrast  Result Date: 03/15/2018 CLINICAL DATA:  67 y/o F; generalized weakness, lightheadedness, and multiple falls. EXAM: MRI HEAD WITHOUT CONTRAST TECHNIQUE: Multiplanar, multiecho pulse sequences of the brain and surrounding structures were obtained without intravenous contrast. COMPARISON:  None. FINDINGS: Brain: 5 mm focus of reduced diffusion within the right mid  corona radiata compatible with acute/early subacute infarction. No hemorrhage or mass effect. Very small chronic infarctions are present within the right cerebellar hemisphere and mid pons. Chronic lacunar infarctions are present within the left anterior limb of internal capsule and thalamus. Several patchynonspecific foci of T2 FLAIR hyperintense signal abnormality in subcortical and periventricular white matter are compatible withmoderatechronic microvascular ischemic changes for age. Moderatebrain parenchymal volume loss. Punctate foci of susceptibility hypointensity are present within the bilateral basal ganglia compatible with hemosiderin deposition of chronic microhemorrhage. Vascular: Normal flow voids. Skull and upper cervical spine: Normal marrow signal. Sinuses/Orbits: Small right maxillary sinus mucous retention cyst. Otherwise negative. 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 IMPRESSION: 1. 5 mm acute/early subacute infarction within the right mid corona radiata. No associated hemorrhage or mass effect. 2. Moderate for age chronic microvascular ischemic changes and parenchymal volume loss of the brain. 3. Small chronic infarctions in the right cerebellum, mid pons, and left basal ganglia. 4. Foci of chronic microhemorrhage in central distribution likely related to chronic hypertension. These results were called by telephone at the time of interpretation on 03/15/2018 at 9:56 pm to Dr. Gareth Morgan , who verbally acknowledged these results. Electronically Signed   By: Kristine Garbe M.D.   On: 03/15/2018 21:58    Procedures Procedures (including critical care time)  Medications Ordered in ED Medications  enalapril (VASOTEC) tablet 20 mg (20 mg Oral Given 03/16/18 1026)  amLODipine (NORVASC) tablet 5 mg (has no administration in time range)  famotidine (PEPCID) tablet 20 mg (has no administration in time range)  acetaminophen (TYLENOL) tablet 650  mg (650 mg Oral Given 03/16/18 0413)    Or  acetaminophen (TYLENOL) solution 650 mg ( Per Tube See Alternative  03/16/18 0413)    Or  acetaminophen (TYLENOL) suppository 650 mg ( Rectal See Alternative 03/16/18 0413)  senna-docusate (Senokot-S) tablet 1 tablet (has no administration in time range)  enoxaparin (LOVENOX) injection 40 mg (has no administration in time range)  insulin aspart (novoLOG) injection 0-9 Units (2 Units Subcutaneous Given 03/16/18 0704)  insulin aspart (novoLOG) injection 0-5 Units (has no administration in time range)  amLODipine (NORVASC) tablet 5 mg (has no administration in time range)  furosemide (LASIX) injection 40 mg (40 mg Intravenous Given 03/16/18 1026)  atorvastatin (LIPITOR) tablet 40 mg (has no administration in time range)  aspirin EC tablet 81 mg (has no administration in time range)  clopidogrel (PLAVIX) tablet 75 mg (has no administration in time range)  amLODipine (NORVASC) tablet 5 mg (5 mg Oral Given 03/15/18 1850)  hydrALAZINE (APRESOLINE) tablet 25 mg (25 mg Oral Given 03/15/18 1850)  labetalol (NORMODYNE,TRANDATE) injection 10 mg (10 mg Intravenous Given 03/15/18 1925)   stroke: mapping our early stages of recovery book ( Does not apply Given 03/16/18 0209)  potassium chloride SA (K-DUR,KLOR-CON) CR tablet 40 mEq (40 mEq Oral Given 03/16/18 0209)     Initial Impression / Assessment and Plan / ED Course  I have reviewed the triage vital signs and the nursing notes.  Pertinent labs & imaging results that were available during my care of the patient were reviewed by me and considered in my medical decision making (see chart for details).     67yo female with history of htn, hlpd, DM presents with generalized weakness, feeling off balance, falls, htn.  No injuries from falls.  Labs without significant abnormalities, no anemia, no significant electrolyte abnormalities. No sign of infection including no UTI or pneumonia.  CK WNL.  CXR shows cardiomegaly  can't rule out pericardial effusion, vascular congestion and MRI shows CVA.  Given labetalol, po amlodipine and hydralazine with improvement in blood pressures.  Will admit for further care.  Final Clinical Impressions(s) / ED Diagnoses   Final diagnoses:  Cerebrovascular accident (CVA), unspecified mechanism ([place])  Hypertensive urgency    ED Discharge Orders    None       Gareth Morgan, MD 03/16/18 1140

## 2018-03-15 NOTE — ED Notes (Signed)
Patient ambulated to the bathroom with minimal assistance. Used the wheelchair as a walker.

## 2018-03-15 NOTE — ED Notes (Signed)
Pt in x-ray at this time

## 2018-03-15 NOTE — ED Notes (Signed)
Patient transported to MRI 

## 2018-03-15 NOTE — ED Notes (Signed)
Attempted report x 2 

## 2018-03-15 NOTE — ED Notes (Signed)
Patient passed stroke swallow screen. MD paged, waiting for response.

## 2018-03-16 ENCOUNTER — Inpatient Hospital Stay (HOSPITAL_COMMUNITY): Payer: Medicare HMO

## 2018-03-16 DIAGNOSIS — I351 Nonrheumatic aortic (valve) insufficiency: Secondary | ICD-10-CM

## 2018-03-16 DIAGNOSIS — E785 Hyperlipidemia, unspecified: Secondary | ICD-10-CM

## 2018-03-16 DIAGNOSIS — N289 Disorder of kidney and ureter, unspecified: Secondary | ICD-10-CM

## 2018-03-16 DIAGNOSIS — E669 Obesity, unspecified: Secondary | ICD-10-CM

## 2018-03-16 DIAGNOSIS — E1169 Type 2 diabetes mellitus with other specified complication: Secondary | ICD-10-CM | POA: Diagnosis present

## 2018-03-16 DIAGNOSIS — I63311 Cerebral infarction due to thrombosis of right middle cerebral artery: Secondary | ICD-10-CM

## 2018-03-16 DIAGNOSIS — I639 Cerebral infarction, unspecified: Principal | ICD-10-CM

## 2018-03-16 LAB — BASIC METABOLIC PANEL
ANION GAP: 12 (ref 5–15)
BUN: 13 mg/dL (ref 6–20)
CHLORIDE: 104 mmol/L (ref 101–111)
CO2: 24 mmol/L (ref 22–32)
Calcium: 8.6 mg/dL — ABNORMAL LOW (ref 8.9–10.3)
Creatinine, Ser: 1.16 mg/dL — ABNORMAL HIGH (ref 0.44–1.00)
GFR calc Af Amer: 55 mL/min — ABNORMAL LOW (ref >60)
GFR calc non Af Amer: 48 mL/min — ABNORMAL LOW (ref >60)
Glucose, Bld: 231 mg/dL — ABNORMAL HIGH (ref 65–99)
POTASSIUM: 3.7 mmol/L (ref 3.5–5.1)
SODIUM: 140 mmol/L (ref 135–145)

## 2018-03-16 LAB — GLUCOSE, CAPILLARY
GLUCOSE-CAPILLARY: 183 mg/dL — AB (ref 65–99)
Glucose-Capillary: 171 mg/dL — ABNORMAL HIGH (ref 65–99)
Glucose-Capillary: 118 mg/dL — ABNORMAL HIGH (ref 65–99)
Glucose-Capillary: 95 mg/dL (ref 65–99)
Glucose-Capillary: 127 mg/dL — ABNORMAL HIGH (ref 65–99)

## 2018-03-16 LAB — ECHOCARDIOGRAM COMPLETE
Height: 68 in
WEIGHTICAEL: 4095.26 [oz_av]

## 2018-03-16 LAB — LIPID PANEL
CHOLESTEROL: 178 mg/dL (ref 0–200)
HDL: 42 mg/dL (ref >40)
LDL Cholesterol: 112 mg/dL — ABNORMAL HIGH (ref 0–99)
TRIGLYCERIDES: 119 mg/dL (ref <150)
Total CHOL/HDL Ratio: 4.2 RATIO
VLDL: 24 mg/dL (ref 0–40)

## 2018-03-16 LAB — URINE CULTURE

## 2018-03-16 LAB — HEMOGLOBIN A1C
Hgb A1c MFr Bld: 7.7 % — ABNORMAL HIGH (ref 4.8–5.6)
Mean Plasma Glucose: 174.29 mg/dL

## 2018-03-16 LAB — TROPONIN I

## 2018-03-16 MED ORDER — AMLODIPINE BESYLATE 10 MG PO TABS
10.0000 mg | ORAL_TABLET | Freq: Every day | ORAL | Status: DC
  Administered 2018-03-16 – 2018-03-17 (×2): 10 mg via ORAL
  Filled 2018-03-16 (×2): qty 1

## 2018-03-16 MED ORDER — ASPIRIN EC 325 MG PO TBEC
325.0000 mg | DELAYED_RELEASE_TABLET | Freq: Every day | ORAL | Status: DC
  Administered 2018-03-17 – 2018-03-18 (×2): 325 mg via ORAL
  Filled 2018-03-16 (×2): qty 1

## 2018-03-16 MED ORDER — POTASSIUM CHLORIDE CRYS ER 20 MEQ PO TBCR
40.0000 meq | EXTENDED_RELEASE_TABLET | ORAL | Status: AC
  Administered 2018-03-16: 40 meq via ORAL
  Filled 2018-03-16: qty 2

## 2018-03-16 MED ORDER — ASPIRIN EC 81 MG PO TBEC: 81.0000 mg | DELAYED_RELEASE_TABLET | Freq: Every day | ORAL | Status: DC

## 2018-03-16 MED ORDER — CLOPIDOGREL BISULFATE 75 MG PO TABS
75.0000 mg | ORAL_TABLET | Freq: Every day | ORAL | Status: DC
  Administered 2018-03-16 – 2018-03-18 (×3): 75 mg via ORAL
  Filled 2018-03-16 (×4): qty 1

## 2018-03-16 MED ORDER — LABETALOL HCL 200 MG PO TABS
200.0000 mg | ORAL_TABLET | Freq: Three times a day (TID) | ORAL | Status: DC
  Administered 2018-03-16 – 2018-03-18 (×5): 200 mg via ORAL
  Filled 2018-03-16 (×5): qty 1

## 2018-03-16 MED ORDER — ATORVASTATIN CALCIUM 40 MG PO TABS
40.0000 mg | ORAL_TABLET | Freq: Every day | ORAL | Status: DC
  Administered 2018-03-16 – 2018-03-17 (×2): 40 mg via ORAL
  Filled 2018-03-16 (×2): qty 1

## 2018-03-16 MED ORDER — IOPAMIDOL (ISOVUE-370) INJECTION 76%
50.0000 mL | Freq: Once | INTRAVENOUS | Status: AC | PRN
  Administered 2018-03-16: 50 mL via INTRAVENOUS

## 2018-03-16 MED ORDER — IOPAMIDOL (ISOVUE-370) INJECTION 76%
INTRAVENOUS | Status: AC
  Filled 2018-03-16: qty 50

## 2018-03-16 MED ORDER — FUROSEMIDE 10 MG/ML IJ SOLN: 40.0000 mg | Freq: Every day | INTRAMUSCULAR | Status: DC

## 2018-03-16 MED ORDER — FUROSEMIDE 10 MG/ML IJ SOLN
40.0000 mg | Freq: Two times a day (BID) | INTRAMUSCULAR | Status: DC
  Administered 2018-03-16 (×2): 40 mg via INTRAVENOUS
  Filled 2018-03-16 (×2): qty 4

## 2018-03-16 NOTE — Progress Notes (Signed)
Received report on patient from [facility], Joellen [place]. Patient being admitted with acute infarcts. Awaiting patients.

## 2018-03-16 NOTE — Consult Note (Signed)
Physical Medicine and Rehabilitation Consult Reason for Consult:impaired balance and gait Referring Physician: Erlinda Hong   HPI: Alicia [name] is a 67 y.o. female with history of HTN, obesity, PVD, DM2 who presented on  4/25 with dizziness and light-headedness and inability to ambulate. MRI of the brain revealed an acute infarct in the right corona radiata. He has been up with therapy who has noted poor posture and easy fatigue with functional tasks/mobility. PM&R was consulted to assess for patient's inpatient rehab potential.    Review of Systems  Constitutional: Negative for fever.  HENT: Negative.   Eyes: Negative for blurred vision and double vision.  Respiratory: Negative for cough.   Cardiovascular: Negative for chest pain.  Gastrointestinal: Negative for nausea.  Genitourinary: Negative for dysuria.  Musculoskeletal: Positive for myalgias.  Skin: Negative.   Neurological: Positive for dizziness and focal weakness.  Psychiatric/Behavioral: Negative for depression.   Past Medical History:  Diagnosis Date  . Arthritis   . Chronic headaches   . Constipation   . Depression   . Diabetes mellitus   . Hyperlipidemia   . Hypertension   . Obesity   . PAD (peripheral artery disease) ([place])   . Shortness of breath    Past Surgical History:  Procedure Laterality Date  . FEMORAL BYPASS  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 Bridgett Larsson   Family History  Problem Relation Age of Onset  . Diabetes Father   . Heart disease Mother        NOT before age 71-  Bypass  . Hypertension Mother   . Hypertension Sister   . Varicose Veins Brother   . Heart disease Brother        Before age 11  . Hypertension Daughter    Social History:  reports that she has been smoking cigarettes.  She has a 17.50 pack-year smoking history. She has never used smokeless tobacco. She reports that she drinks about 0.5 oz of alcohol per week. She reports that she does not  use drugs. Allergies: No Known Allergies Medications Prior to Admission  Medication Sig Dispense Refill  . amLODipine (NORVASC) 5 MG tablet Take 5 mg by mouth at bedtime.     Marland Kitchen aspirin 81 MG tablet Take 81 mg by mouth daily.      . empagliflozin (JARDIANCE) 10 MG TABS tablet Take 10 mg by mouth daily.    . enalapril (VASOTEC) 20 MG tablet Take 20 mg by mouth 2 (two) times daily.     Marland Kitchen ENTRESTO 97-103 MG Take 1 tablet by mouth 2 (two) times daily.     . famotidine (PEPCID) 20 MG tablet Take 20 mg by mouth as needed.     . hydrochlorothiazide (HYDRODIURIL) 25 MG tablet Take 25 mg by mouth daily.     . indapamide (LOZOL) 1.25 MG tablet Take 1.25 mg by mouth daily.     . metFORMIN (GLUCOPHAGE) 1000 MG tablet Take 1,000 mg by mouth 2 (two) times daily with a meal.      . NIFEdipine (PROCARDIA-XL/ADALAT CC) 60 MG 24 hr tablet Take 60 mg by mouth 2 (two) times daily.     Marland Kitchen nystatin cream (MYCOSTATIN) Apply topically 2 (two) times daily. (Patient taking differently: Apply 1 application topically as needed. ) 30 g 1  . rosuvastatin (CRESTOR) 10 MG tablet Take 10 mg by mouth daily.     . cyclobenzaprine (FLEXERIL) 10 MG tablet Take 1 tablet (  10 mg total) by mouth 2 (two) times daily as needed for muscle spasms. (Patient not taking: Reported on 03/24/2017) 20 tablet 0  . diazepam (VALIUM) 5 MG tablet Take 1 tablet (5 mg total) by mouth 2 (two) times daily. (Patient not taking: Reported on 03/24/2017) 20 tablet 0  . ibuprofen (ADVIL,MOTRIN) 800 MG tablet Take 1 tablet (800 mg total) by mouth 3 (three) times daily. (Patient not taking: Reported on 02/06/2018) 21 tablet 0  . metroNIDAZOLE (FLAGYL) 250 MG tablet Take 1 tablet (250 mg total) by mouth 3 (three) times daily. (Patient not taking: Reported on 02/06/2018) 21 tablet 0  . nystatin (MYCOSTATIN/NYSTOP) 100000 UNIT/GM POWD Apply to dry skin folds twice daily (Patient not taking: Reported on 03/15/2018) 60 g 1  . oxyCODONE-acetaminophen (PERCOCET) 5-325 MG per  tablet Take 2 tablets by mouth every 4 (four) hours as needed for pain. (Patient not taking: Reported on 03/24/2017) 15 tablet 0  . oxyCODONE-acetaminophen (PERCOCET/ROXICET) 5-325 MG per tablet Take 2 tablets by mouth every 6 (six) hours as needed for pain. (Patient not taking: Reported on 03/24/2017) 15 tablet 0  . oxyCODONE-acetaminophen (PERCOCET/ROXICET) 5-325 MG per tablet Take 1 tablet by mouth every 6 (six) hours as needed for pain. (Patient not taking: Reported on 03/24/2017) 20 tablet 0  . predniSONE (DELTASONE) 20 MG tablet 3 tabs po daily x 3 days, then 2 tabs x 3 days, then 1.5 tabs x 3 days, then 1 tab x 3 days, then 0.5 tabs x 3 days (Patient not taking: Reported on 03/24/2017) 27 tablet 0    Home: Home Living Family/patient expects to be discharged to:: Private residence Living Arrangements: Alone Available Help at Discharge: Friend(s), Available PRN/intermittently Type of Home: Apartment Home Access: Stairs to enter Technical brewer of Steps: 2 Entrance Stairs-Rails: Right, Left Home Layout: One level Bathroom Shower/Tub: Tub/shower unit, Architectural technologist: Standard Bathroom Accessibility: No Home Equipment: Bedside commode, Environmental consultant - 2 wheels, Cane - single point  Functional History: Prior Function Level of Independence: Independent Comments: drives Functional Status:  Mobility: Bed Mobility Overal bed mobility: Needs Assistance Bed Mobility: Supine to Sit Supine to sit: Supervision General bed mobility comments: DNT, received up in bathroom with OT  Transfers Overall transfer level: Needs assistance Equipment used: Rolling walker (2 wheeled) Transfers: Sit to/from Stand Sit to Stand: Min guard Ambulation/Gait Ambulation/Gait assistance: Min guard Ambulation Distance (Feet): 150 Feet Assistive device: Rolling walker (2 wheeled) Gait Pattern/deviations: Step-through pattern, Decreased step length - right, Decreased step length - left, Decreased stride  length, Trunk flexed General Gait Details: B LE weakness noted during gait, patient easily fatigued and heavily reliant on B UE support during gait     ADL: ADL Overall ADL's : Needs assistance/impaired Grooming: Set up, Standing, Min guard Upper Body Bathing: Set up, Sitting Lower Body Bathing: Sit to/from stand, Min guard Upper Body Dressing : Set up, Sitting Lower Body Dressing: Minimal assistance, Sit to/from stand Toilet Transfer: Minimal assistance, RW, Ambulation, Comfort height toilet, Grab bars Toileting- Clothing Manipulation and Hygiene: Min guard, Sit to/from stand Functional mobility during ADLs: Minimal assistance, Rolling walker, Cueing for safety  Cognition: Cognition Overall Cognitive Status: Within Functional Limits for tasks assessed Orientation Level: Oriented X4 Cognition Arousal/Alertness: Awake/alert Behavior During Therapy: WFL for tasks assessed/performed Overall Cognitive Status: Within Functional Limits for tasks assessed  Blood pressure (!) 193/82, pulse 70, temperature 98.6 F (37 C), temperature source Oral, resp. rate 16, height 5\' 8"  (1.727 m), weight 116.1 kg (255 lb 15.3  oz), SpO2 100 %. Physical Exam  Constitutional: She appears well-developed.  obese  HENT:  Head: Normocephalic.  Eyes: Pupils are equal, round, and reactive to light.  Neck: No thyromegaly present.  Cardiovascular: Normal rate.  Respiratory: Effort normal.  GI: Soft.  Musculoskeletal: Normal range of motion.  Neurological:  RUE 5/5. LUE 4+ to 5/5. No PD, reasonable [place]. LLE 4/5 prox to distal. RLE 5/5. No sensory changes  Psychiatric: She has a normal mood and affect. Her behavior is normal.    Results for orders placed or performed during the hospital encounter of 03/15/18 (from the past 24 hour(s))  Hemoglobin A1c     Status: Abnormal   Collection Time: 03/16/18  3:35 AM  Result Value Ref Range   Hgb A1c MFr Bld 7.7 (H) 4.8 - 5.6 %   Mean Plasma Glucose 174.29 mg/dL    Troponin I (q 6hr x 3)     Status: None   Collection Time: 03/16/18  3:35 AM  Result Value Ref Range   Troponin I <0.03 <0.03 ng/mL  Lipid panel     Status: Abnormal   Collection Time: 03/16/18  3:35 AM  Result Value Ref Range   Cholesterol 178 0 - 200 mg/dL   Triglycerides 119 <150 mg/dL   HDL 42 >40 mg/dL   Total CHOL/HDL Ratio 4.2 RATIO   VLDL 24 0 - 40 mg/dL   LDL Cholesterol 112 (H) 0 - 99 mg/dL  Glucose, capillary     Status: Abnormal   Collection Time: 03/16/18  6:50 AM  Result Value Ref Range   Glucose-Capillary 183 (H) 65 - 99 mg/dL  Basic metabolic panel     Status: Abnormal   Collection Time: 03/16/18  9:34 AM  Result Value Ref Range   Sodium 140 135 - 145 mmol/L   Potassium 3.7 3.5 - 5.1 mmol/L   Chloride 104 101 - 111 mmol/L   CO2 24 22 - 32 mmol/L   Glucose, Bld 231 (H) 65 - 99 mg/dL   BUN 13 6 - 20 mg/dL   Creatinine, Ser 1.16 (H) 0.44 - 1.00 mg/dL   Calcium 8.6 (L) 8.9 - 10.3 mg/dL   GFR calc non Af Amer 48 (L) >60 mL/min   GFR calc Af Amer 55 (L) >60 mL/min   Anion gap 12 5 - 15  Troponin I (q 6hr x 3)     Status: None   Collection Time: 03/16/18  9:34 AM  Result Value Ref Range   Troponin I <0.03 <0.03 ng/mL  Glucose, capillary     Status: Abnormal   Collection Time: 03/16/18  2:43 PM  Result Value Ref Range   Glucose-Capillary 171 (H) 65 - 99 mg/dL  Glucose, capillary     Status: Abnormal   Collection Time: 03/16/18  3:59 PM  Result Value Ref Range   Glucose-Capillary 118 (H) 65 - 99 mg/dL  Glucose, capillary     Status: None   Collection Time: 03/16/18  5:06 PM  Result Value Ref Range   Glucose-Capillary 95 65 - 99 mg/dL   Ct Angio Head W Or Wo Contrast  Result Date: 03/16/2018 CLINICAL DATA:  Followup stroke in the right posterior frontal white matter. EXAM: CT ANGIOGRAPHY HEAD AND NECK TECHNIQUE: Multidetector CT imaging of the head and neck was performed using the standard protocol during bolus administration of intravenous contrast.  Multiplanar CT image reconstructions and MIPs were obtained to evaluate the vascular anatomy. Carotid stenosis measurements (when applicable) are obtained utilizing  NASCET criteria, using the distal internal carotid diameter as the denominator. CONTRAST:  50mL ISOVUE-370 IOPAMIDOL (ISOVUE-370) INJECTION 76% COMPARISON:  MRI yesterday. FINDINGS: CT HEAD FINDINGS Brain: Mild generalized atrophy. Moderate changes of chronic small vessel disease of the hemispheric white matter. Old pontine and thalamic strokes. Tiny low-density probably correlating with the acute stroke on MRI in the right corona radiata. No evidence of stroke extension or hemorrhage. No mass effect or shift. No hydrocephalus or extra-axial collection. Question benign dural calcifications versus calcified meningioma along the sphenoid wing on the left. No significant mass-effect upon the brain. Vascular: There is atherosclerotic calcification of the major vessels at the base of the brain. Skull: Negative Sinuses: Clear Orbits: Normal Review of the MIP images confirms the above findings CTA NECK FINDINGS Aortic arch: Atherosclerotic calcification without aneurysm or dissection. Branching pattern of the brachiocephalic vessels is normal without origin stenosis. Right carotid system: Common carotid artery is tortuous but widely patent to the bifurcation. There is calcified and soft plaque at the carotid bifurcation but the minimal diameter of the proximal ICA is 5 mm, this same as the more distal cervical ICA, therefore no stenosis. Left carotid system: Common carotid artery is mildly tortuous but widely patent to the bifurcation. Soft and calcified plaque at the bifurcation but no stenosis or irregularity. Cervical ICA tortuous but widely patent. Vertebral arteries: Left vertebral artery origin is patent. Left vertebral artery shows scattered foci of atherosclerotic plaque, with stenoses in the range of 30-40%, but the vessel is patent through the  cervical region to the foramen magnum. 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 Skeleton: Minimal cervical spondylosis. Other neck: No mass or lymphadenopathy. Upper chest: Negative Review of the MIP images confirms the above findings CTA HEAD FINDINGS Anterior circulation: Both internal carotid arteries are patent through the skull base and siphon region. There is ordinary peripheral atherosclerotic calcification in the carotid siphon regions, with stenoses likely in the 30-50% range on each side. The anterior and middle cerebral vessels are patent without correctable proximal stenosis, aneurysm or vascular malformation. There is atherosclerotic narrowing and irregularity in the more distal branch vessels, particularly in the right superior division M2 region. This would place the patient at some risk of a right MCA territory infarction. No large vessel occlusion. Posterior circulation: The left vertebral artery is widely patent through the foramen magnum to the basilar. The right vertebral artery reconstitutes by cervical collaterals at the foramen magnum. There is calcified plaque at the foramen magnum with stenosis of 50-70%. Right PICA is visualized. Distal to that, the vessel is occluded again. No basilar stenosis. Superior cerebellar arteries and posterior cerebral arteries are patent. There are stenoses in both PCA territories. Venous sinuses: Patent and normal Anatomic variants: None significant Delayed phase: No abnormal enhancement Review of the MIP images confirms the above findings IMPRESSION: No carotid bifurcation disease of significance. 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 Left vertebral artery sufficiently patent to the basilar. Posterior circulation branch vessels are patent but do show medium vessel  stenoses. Atherosclerotic disease in both carotid siphon regions with serial stenoses in the range of 30-50% on each side. Superior division M2 stenosis on the right, placing the patient at risk of right MCA infarction. One could consider neuro interventional consultation regarding this stenosis, given the recent infarction. Electronically Signed  By: Nelson Chimes M.D.   On: 03/16/2018 16:03   Dg Chest 2 View  Result Date: 03/15/2018 CLINICAL DATA:  Chest pain EXAM: CHEST - 2 VIEW COMPARISON:  02/17/2011 FINDINGS: No pleural effusion. Moderate cardiomegaly increased compared to prior. No pleural effusion or focal consolidation. Mild vascular congestion. No pneumothorax. IMPRESSION: Moderate cardiomegaly with vascular congestion, increased compared to prior from 2012. Could consider component of pericardial effusion. Electronically Signed   By: Donavan Foil M.D.   On: 03/15/2018 19:01   Dg Lumbar Spine Complete  Result Date: 03/15/2018 CLINICAL DATA:  Fall EXAM: LUMBAR SPINE - COMPLETE 4+ VIEW COMPARISON:  CT 03/28/2018 FINDINGS: Lumbar alignment within normal limits. Mild degenerative changes at L3-L4 and L5-S1. Questionable mild superior endplate deformity at L1. Aortic atherosclerosis. IMPRESSION: 1. Possible mild superior endplate deformity at L1 2. Degenerative changes. Electronically Signed   By: Donavan Foil M.D.   On: 03/15/2018 19:03   Ct Angio Neck W Or Wo Contrast  Result Date: 03/16/2018 CLINICAL DATA:  Followup stroke in the right posterior frontal white matter. EXAM: CT ANGIOGRAPHY HEAD AND NECK TECHNIQUE: Multidetector CT imaging of the head and neck was performed using the standard protocol during bolus administration of intravenous contrast. Multiplanar CT image reconstructions and MIPs were obtained to evaluate the vascular anatomy. Carotid stenosis measurements (when applicable) are obtained utilizing NASCET criteria, using the distal internal carotid diameter as the denominator.  CONTRAST:  32mL ISOVUE-370 IOPAMIDOL (ISOVUE-370) INJECTION 76% COMPARISON:  MRI yesterday. FINDINGS: CT HEAD FINDINGS Brain: Mild generalized atrophy. Moderate changes of chronic small vessel disease of the hemispheric white matter. Old pontine and thalamic strokes. Tiny low-density probably correlating with the acute stroke on MRI in the right corona radiata. No evidence of stroke extension or hemorrhage. No mass effect or shift. No hydrocephalus or extra-axial collection. Question benign dural calcifications versus calcified meningioma along the sphenoid wing on the left. No significant mass-effect upon the brain. Vascular: There is atherosclerotic calcification of the major vessels at the base of the brain. Skull: Negative Sinuses: Clear Orbits: Normal Review of the MIP images confirms the above findings CTA NECK FINDINGS Aortic arch: Atherosclerotic calcification without aneurysm or dissection. Branching pattern of the brachiocephalic vessels is normal without origin stenosis. Right carotid system: Common carotid artery is tortuous but widely patent to the bifurcation. There is calcified and soft plaque at the carotid bifurcation but the minimal diameter of the proximal ICA is 5 mm, this same as the more distal cervical ICA, therefore no stenosis. Left carotid system: Common carotid artery is mildly tortuous but widely patent to the bifurcation. Soft and calcified plaque at the bifurcation but no stenosis or irregularity. Cervical ICA tortuous but widely patent. Vertebral arteries: Left vertebral artery origin is patent. Left vertebral artery shows scattered foci of atherosclerotic plaque, with stenoses in the range of 30-40%, but the vessel is patent through the cervical region to the foramen magnum. 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 Skeleton: Minimal cervical spondylosis. Other neck: No mass or lymphadenopathy. Upper chest: Negative Review  of the MIP images confirms the above findings CTA HEAD FINDINGS Anterior circulation: Both internal carotid arteries are patent through the skull base and siphon region. There is ordinary peripheral atherosclerotic calcification in the carotid siphon regions, with stenoses likely in the 30-50% range on each side. The anterior and middle cerebral vessels are patent without correctable proximal stenosis, aneurysm or vascular malformation. There is atherosclerotic narrowing  and irregularity in the more distal branch vessels, particularly in the right superior division M2 region. This would place the patient at some risk of a right MCA territory infarction. No large vessel occlusion. Posterior circulation: The left vertebral artery is widely patent through the foramen magnum to the basilar. The right vertebral artery reconstitutes by cervical collaterals at the foramen magnum. There is calcified plaque at the foramen magnum with stenosis of 50-70%. Right PICA is visualized. Distal to that, the vessel is occluded again. No basilar stenosis. Superior cerebellar arteries and posterior cerebral arteries are patent. There are stenoses in both PCA territories. Venous sinuses: Patent and normal Anatomic variants: None significant Delayed phase: No abnormal enhancement Review of the MIP images confirms the above findings IMPRESSION: No carotid bifurcation disease of significance. 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 Left vertebral artery sufficiently patent to the basilar. Posterior circulation branch vessels are patent but do show medium vessel stenoses. Atherosclerotic disease in both carotid siphon regions with serial stenoses in the range of 30-50% on each side. Superior division M2 stenosis on the right, placing the patient at risk of right MCA infarction. One could consider neuro  interventional consultation regarding this stenosis, given the recent infarction. Electronically Signed   By: Nelson Chimes M.D.   On: 03/16/2018 16:03   Mr Brain Wo Contrast  Result Date: 03/15/2018 CLINICAL DATA:  67 y/o F; generalized weakness, lightheadedness, and multiple falls. EXAM: MRI HEAD WITHOUT CONTRAST TECHNIQUE: Multiplanar, multiecho pulse sequences of the brain and surrounding structures were obtained without intravenous contrast. COMPARISON:  None. FINDINGS: Brain: 5 mm focus of reduced diffusion within the right mid corona radiata compatible with acute/early subacute infarction. No hemorrhage or mass effect. Very small chronic infarctions are present within the right cerebellar hemisphere and mid pons. Chronic lacunar infarctions are present within the left anterior limb of internal capsule and thalamus. Several patchynonspecific foci of T2 FLAIR hyperintense signal abnormality in subcortical and periventricular white matter are compatible withmoderatechronic microvascular ischemic changes for age. Moderatebrain parenchymal volume loss. Punctate foci of susceptibility hypointensity are present within the bilateral basal ganglia compatible with hemosiderin deposition of chronic microhemorrhage. Vascular: Normal flow voids. Skull and upper cervical spine: Normal marrow signal. Sinuses/Orbits: Small right maxillary sinus mucous retention cyst. Otherwise negative. 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 IMPRESSION: 1. 5 mm acute/early subacute infarction within the right mid corona radiata. No associated hemorrhage or mass effect. 2. Moderate for age chronic microvascular ischemic changes and parenchymal volume loss of the brain. 3. Small chronic infarctions in the right cerebellum, mid pons, and left basal ganglia. 4. Foci of chronic microhemorrhage in central distribution likely related to chronic hypertension. These results were called by telephone at the  time of interpretation on 03/15/2018 at 9:56 pm to Dr. Gareth Morgan , who verbally acknowledged these results. Electronically Signed   By: Kristine Garbe M.D.   On: 03/15/2018 21:58    Assessment/Plan: Diagnosis: Right corona radiata infarct, Hospital day #3 1. Does the need for close, 24 hr/day medical supervision in concert with the patient's rehab needs make it unreasonable for this patient to be served in a less intensive setting? No and Potentially 2. Co-Morbidities requiring supervision/potential complications: HTN, CHF, PVD, morbid obesity 3. Due to bladder management, bowel management, safety, skin/wound care, disease management and medication administration, does the patient require 24 hr/day  rehab nursing? No and Potentially 4. Does the patient require coordinated care of a physician, rehab nurse, PT (1-2 hrs/day, 5 days/week) and OT (1-2 hrs/day, 5 days/week) to address physical and functional deficits in the context of the above medical diagnosis(es)? No and Potentially Addressing deficits in the following areas: balance, endurance, locomotion, strength, transferring, bowel/bladder control, bathing, dressing, feeding, grooming and toileting 5. Can the patient actively participate in an intensive therapy program of at least 3 hrs of therapy per day at least 5 days per week? Yes 6. The potential for patient to make measurable gains while on inpatient rehab is fair 7. Anticipated functional outcomes upon discharge from inpatient rehab are modified independent  with PT, modified independent with OT, n/a with SLP. 8. Estimated rehab length of stay to reach the above functional goals is: see below 9. Anticipated D/C setting: Home 10. Anticipated post D/C treatments: [place] therapy 11. Overall Rehab/Functional Prognosis: excellent  RECOMMENDATIONS: This patient's condition is appropriate for continued rehabilitative care in the following setting: [facility] Therapy Patient has agreed to  participate in recommended program. Yes Note that insurance prior authorization may be required for reimbursement for recommended care.  Comment: Pt is already getting up to the bathroom on her own in the room.  She does have some persistent dizziness and weakness however and does live alone.  Will follow for further progress but expect that she can discharge to home with home health services.  Meredith Staggers, MD, [place] [facility] 03/17/2018    Meredith Staggers, MD 03/16/2018

## 2018-03-16 NOTE — Evaluation (Signed)
Physical Therapy Evaluation Patient Details Name: Alicia [name] MRN: [id number] DOB: 1951-01-16 Today's Date: 03/16/2018   History of Present Illness  67 y.o. female presenting to the ED with generalized weakness and lightheadedness, in conjunction with 2 falls in the past few days. 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 Able to statically stand with B UE support at sink and brush teeth, then ambulate approximately 169f with RW and gait impairments as noted below, significant limited by fatigue and with significant weakness noted in B LEs. She was left up in the chair with all needs met, RN present and attending. Moving forward she will benefit from skilled PT services in the acute setting as well as aggressive therapies in the CIR setting to reduce fall risk and assist in return to optimal level of independent function.     Follow Up Recommendations CIR    Equipment Recommendations  Other (comment)(defer to next venue )    Recommendations for Other Services       Precautions / Restrictions Precautions Precautions: Fall Restrictions Weight Bearing Restrictions: No      Mobility  Bed Mobility Overal bed mobility: Needs Assistance Bed Mobility: Supine to Sit     Supine to sit: Supervision     General bed mobility comments: DNT, received up in bathroom with OT   Transfers Overall transfer level: Needs assistance Equipment used: Rolling walker (2 wheeled) Transfers: Sit to/from Stand Sit to Stand: Min guard            Ambulation/Gait Ambulation/Gait assistance: Min guard Ambulation Distance (Feet): 150 Feet Assistive device: Rolling walker (2 wheeled) Gait Pattern/deviations: Step-through pattern;Decreased step length -  right;Decreased step length - left;Decreased stride length;Trunk flexed     General Gait Details: B LE weakness noted during gait, patient easily fatigued and heavily reliant on B UE support during gait   Stairs            Wheelchair Mobility    Modified Rankin (Stroke Patients Only) Modified Rankin (Stroke Patients Only) Pre-Morbid Rankin Score: No symptoms Modified Rankin: Moderate disability     Balance Overall balance assessment: History of Falls;Needs assistance(3 falls in 1 week )   Sitting balance-Leahy Scale: Good       Standing balance-Leahy Scale: Poor Standing balance comment: reliant on external support                             Pertinent Vitals/Pain Pain Assessment: No/denies pain    Home Living Family/patient expects to be discharged to:: Private residence Living Arrangements: Alone Available Help at Discharge: Friend(s);Available PRN/intermittently Type of Home: Apartment Home Access: Stairs to enter Entrance Stairs-Rails: Right;Left Entrance Stairs-Number of Steps: 2 Home Layout: One level Home Equipment: Bedside commode;Walker - 2 wheels;Cane - single point      Prior Function Level of Independence: Independent         Comments: drives     Hand Dominance   Dominant Hand: Right    Extremity/Trunk Assessment   Upper Extremity Assessment Upper Extremity Assessment: Defer to OT evaluation RUE Deficits / Details: Apparent RTC insufficiency but able to use functionally    Lower Extremity Assessment Lower Extremity Assessment: Generalized weakness    Cervical / Trunk Assessment Cervical / Trunk Assessment: Normal  Communication   Communication: No difficulties  Cognition Arousal/Alertness: Awake/alert Behavior During Therapy: WFL for tasks assessed/performed Overall Cognitive Status: Within Functional Limits for tasks assessed                                        General Comments       Exercises     Assessment/Plan    PT Assessment Patient needs continued PT services  PT Problem List Decreased strength;Decreased mobility;Decreased safety awareness;Decreased coordination;Decreased activity tolerance;Decreased balance       PT Treatment Interventions DME instruction;Therapeutic activities;Gait training;Patient/family education;Therapeutic exercise;Stair training;Balance training;Functional mobility training;Neuromuscular re-education    PT Goals (Current goals can be found in the Care Plan section)  Acute Rehab PT Goals Patient Stated Goal: to be independent PT Goal Formulation: With patient Time For Goal Achievement: 03/30/18 Potential to Achieve Goals: Good    Frequency Min 4X/week   Barriers to discharge        Co-evaluation               AM-PAC PT "6 Clicks" Daily Activity  Outcome Measure Difficulty turning over in bed (including adjusting bedclothes, sheets and blankets)?: A Little Difficulty moving from lying on back to sitting on the side of the bed? : A Little Difficulty sitting down on and standing up from a chair with arms (e.g., wheelchair, bedside commode, etc,.)?: A Little Help needed moving to and from a bed to chair (including a wheelchair)?: A Little Help needed walking in hospital room?: A Little Help needed climbing 3-5 steps with a railing? : A Lot 6 Click Score: 17    End of Session Equipment Utilized During Treatment: Gait belt Activity Tolerance: Patient tolerated treatment well;Patient limited by fatigue Patient left: in chair;with call bell/phone within reach;with nursing/sitter in room   PT Visit Diagnosis: Unsteadiness on feet (R26.81);Muscle weakness (generalized) (M62.81);Difficulty in walking, not elsewhere classified (R26.2)    Time: 8768-1157 PT Time Calculation (min) (ACUTE ONLY): 11 min   Charges:   PT Evaluation $PT Eval Low Complexity: 1 Low     PT G Codes:        Deniece Ree PT, DPT,  CBIS  Supplemental Physical Therapist [place]   Pager [phone]

## 2018-03-16 NOTE — Progress Notes (Signed)
Pt off unit to CT for procedure. Delia Heady RN

## 2018-03-16 NOTE — Progress Notes (Signed)
Inpatient Rehabilitation  Per PT, OT request, patient was screened by Gunnar Fusi for appropriateness for an Inpatient Acute Rehab consult.  At this time we will plan to follow for next set of therapy notes, as patient may progress well on her own.  Call if questions.   Carmelia Roller., CCC/SLP Admission Coordinator  [place]  Cell [phone]

## 2018-03-16 NOTE — Care Management Note (Signed)
Case Management Note  Patient Details  Name: Alicia [name] MRN: [id number] Date of Birth: [birth year]  Subjective/Objective:    Pt admitted with CVA. She is from home alone.                Action/Plan: Recommendations are for CIR. CM following for d/c disposition.   Expected Discharge Date:  03/17/18               Expected Discharge Plan:  [place]  In-House Referral:     Discharge planning Services  CM Consult  Post Acute Care Choice:    Choice offered to:     DME Arranged:    DME Agency:     HH Arranged:    HH Agency:     Status of Service:  In process, will continue to follow  If discussed at Long Length of Stay Meetings, dates discussed:    Additional Comments:  Pollie Friar, RN 03/16/2018, 1:58 PM

## 2018-03-16 NOTE — Progress Notes (Signed)
Occupational Therapy Evaluation Patient Details Name: Alicia [name] MRN: [id number] DOB: [birth year] Today's Date: 03/16/2018    History of Present Illness 67 y.o. female presenting to the ED with generalized weakness and lightheadedness, in conjunction with 2 falls in the past few days. An MRI was obtained, revealing a subcentimeter acute right corona radiata ischemic infarction.Marland Kitchen PMH: arthritis;DM;depression; PAD; HTN; obesity   Clinical Impression   PTA, pt independent with ADL, IADL tasks and mobility until @ 1-2 weeks ago per pt when she began having falls and began using a cane and furniture walking. Pt states she wasn't sure what medications to take because her MD recently changed her medications so she "just stopped some of them because they made her feel so bad". At this time, feel pt would benefit form rehab at [facility] to reach modified independent level to facilitate safe DC home. Will follow acutely to address established goals.     Follow Up Recommendations  CIR    Equipment Recommendations  None recommended by OT    Recommendations for Other Services Rehab consult     Precautions / Restrictions Precautions Precautions: Fall Restrictions Weight Bearing Restrictions: No      Mobility Bed Mobility Overal bed mobility: Needs Assistance Bed Mobility: Supine to Sit     Supine to sit: Supervision        Transfers Overall transfer level: Needs assistance Equipment used: Rolling walker (2 wheeled) Transfers: Sit to/from Stand Sit to Stand: Min guard              Balance Overall balance assessment: History of Falls;Needs assistance(3 falls in 1 week)   Sitting balance-Leahy Scale: Good       Standing balance-Leahy Scale: Poor Standing balance comment: reliant on external support                           ADL either performed or assessed with clinical judgement   ADL Overall ADL's : Needs assistance/impaired     Grooming: Set  up;Standing;Min guard   Upper Body Bathing: Set up;Sitting   Lower Body Bathing: Sit to/from stand;Min guard   Upper Body Dressing : Set up;Sitting   Lower Body Dressing: Minimal assistance;Sit to/from stand   Toilet Transfer: Minimal assistance;RW;Ambulation;Comfort height toilet;Grab bars   Toileting- Clothing Manipulation and Hygiene: Min guard;Sit to/from stand       Functional mobility during ADLs: Minimal assistance;Rolling walker;Cueing for safety       Vision Baseline Vision/History: Wears glasses Wears Glasses: Reading only;Distance only Patient Visual Report: No change from baseline Vision Assessment?: Yes Eye Alignment: Within Functional Limits Ocular Range of Motion: Within Functional Limits Alignment/Gaze Preference: Within Defined Limits Tracking/Visual Pursuits: Able to track stimulus in all quads without difficulty Saccades: Within functional limits Convergence: Within functional limits Visual Fields: No apparent deficits     Agricultural engineer Tested?: (WFL)   Praxis Praxis Praxis tested?: Within functional limits    Pertinent Vitals/Pain Pain Assessment: No/denies pain     Hand Dominance Right   Extremity/Trunk Assessment Upper Extremity Assessment Upper Extremity Assessment: RUE deficits/detail RUE Deficits / Details: Apparent RTC insufficiency but able to use functionally   Lower Extremity Assessment Lower Extremity Assessment: Defer to PT evaluation;Generalized weakness   Cervical / Trunk Assessment Cervical / Trunk Assessment: Normal   Communication Communication Communication: No difficulties   Cognition Arousal/Alertness: Awake/alert Behavior During Therapy: WFL for tasks assessed/performed  Cognition - pt unable to give medications she was taking although  she has had a recent change in her meds. Pt apparently frustrated with this. Pt does demonstrate decreased judgement.safety awareness. States she had a fall but did  not call EMS because her door was locked. Pt did not feel good and eventually dove herself to the ED.  Cognition most likely baseline. No family available to determine.                                        General Comments       Exercises     Shoulder Instructions      Home Living Family/patient expects to be discharged to:: Private residence Living Arrangements: Alone Available Help at Discharge: Friend(s);Available PRN/intermittently Type of Home: Apartment Home Access: Stairs to enter Entrance Stairs-Number of Steps: 2 Entrance Stairs-Rails: Right;Left Home Layout: One level     Bathroom Shower/Tub: Corporate investment banker: Standard Bathroom Accessibility: No   Home Equipment: Bedside commode;Walker - 2 wheels;Cane - single point          Prior Functioning/Environment Level of Independence: Independent        Comments: drives        OT Problem List: Decreased strength;Decreased range of motion;Decreased activity tolerance;Impaired balance (sitting and/or standing);Decreased safety awareness;Decreased knowledge of use of DME or AE;Impaired sensation;Obesity      OT Treatment/Interventions: Self-care/ADL training;Therapeutic exercise;DME and/or AE instruction;Therapeutic activities;Cognitive remediation/compensation;Patient/family education;Balance training    OT Goals(Current goals can be found in the care plan section) Acute Rehab OT Goals Patient Stated Goal: to be independent OT Goal Formulation: With patient Time For Goal Achievement: 03/30/18 Potential to Achieve Goals: Good  OT Frequency: Min 2X/week   Barriers to D/C:            Co-evaluation              AM-PAC PT "6 Clicks" Daily Activity     Outcome Measure Help from another person eating meals?: None Help from another person taking care of personal grooming?: A Little Help from another person toileting, which includes using toliet, bedpan, or  urinal?: A Little Help from another person bathing (including washing, rinsing, drying)?: A Little Help from another person to put on and taking off regular upper body clothing?: A Little Help from another person to put on and taking off regular lower body clothing?: A Little 6 Click Score: 19   End of Session Equipment Utilized During Treatment: Gait belt;Rolling walker Nurse Communication: Mobility status  Activity Tolerance: Patient tolerated treatment well Patient left: Other (comment)(with PT)  OT Visit Diagnosis: Unsteadiness on feet (R26.81);Muscle weakness (generalized) (M62.81);History of falling (Z91.81)                Time: 1017-5102 OT Time Calculation (min): 28 min Charges:  OT General Charges $OT Visit: 1 Visit OT Evaluation $OT Eval Moderate Complexity: 1 Mod OT Treatments $Self Care/Home Management : 8-22 mins G-Codes:     [facility], OT/L  [phone] 03/16/2018  Davanee Klinkner,HILLARY 03/16/2018, 10:39 AM

## 2018-03-16 NOTE — Progress Notes (Signed)
  Echocardiogram 2D Echocardiogram has been performed.  Alicia [name] 03/16/2018, 2:24 PM

## 2018-03-16 NOTE — Progress Notes (Signed)
Patient brought to floor via stretcher. Ambulated to BR and then to be with steady gait. Cardiac monitor placed #17 and called in to central monitoring. Height and weight completed and put into computer. Patient is alert and oriented x 4.

## 2018-03-16 NOTE — Progress Notes (Signed)
STROKE TEAM PROGRESS NOTE   SUBJECTIVE (INTERVAL HISTORY) Her RN is at the bedside.  Overall she feels her condition is stable. She still has generalized weakness but able to walk with walker to bathroom with minimal assistance. PT/OT recommend CIR. She said her generalized weakness and lightheadedness have been going on for several weeks, now worsened recently. She said her PCP changed her BP meds but her BP still high and she thought her generalized weakness was due to the medication.    OBJECTIVE Temp:  [98.3 F (36.8 C)-99.3 F (37.4 C)] 98.6 F (37 C) (04/26 1604) Pulse Rate:  [64-90] 70 (04/26 1604) Cardiac Rhythm: Other (Comment) (04/26 0815) Resp:  [16-25] 16 (04/26 1604) BP: (170-210)/(79-184) 193/82 (04/26 1604) SpO2:  [93 %-100 %] 100 % (04/26 1604) FiO2 (%):  [28 %] 28 % (04/25 2258) Weight:  [255 lb 15.3 oz (116.1 kg)] 255 lb 15.3 oz (116.1 kg) (04/26 0036)  Recent Labs  Lab 03/16/18 0650 03/16/18 1443 03/16/18 1706  GLUCAP 183* 171* 95   Recent Labs  Lab 03/15/18 1133 03/16/18 0934  NA 139 140  K 3.6 3.7  CL 103 104  CO2 25 24  GLUCOSE 216* 231*  BUN 17 13  CREATININE 1.08* 1.16*  CALCIUM 8.6* 8.6*   Recent Labs  Lab 03/15/18 1806  AST 11*  ALT 12*  ALKPHOS 90  BILITOT 0.9  PROT 7.1  ALBUMIN 3.5   Recent Labs  Lab 03/15/18 1133  WBC 6.7  HGB 12.9  HCT 39.7  MCV 94.1  PLT 286   Recent Labs  Lab 03/15/18 1806 03/16/18 0335 03/16/18 0934  CKTOTAL 53  --   --   TROPONINI  --  <0.03 <0.03   No results for input(s): LABPROT, INR in the last 72 hours. Recent Labs    03/15/18 1720  COLORURINE YELLOW  LABSPEC 1.015  PHURINE 7.0  GLUCOSEU NEGATIVE  HGBUR NEGATIVE  BILIRUBINUR NEGATIVE  KETONESUR NEGATIVE  PROTEINUR 100*  NITRITE NEGATIVE  LEUKOCYTESUR NEGATIVE       Component Value Date/Time   CHOL 178 03/16/2018 0335   TRIG 119 03/16/2018 0335   HDL 42 03/16/2018 0335   CHOLHDL 4.2 03/16/2018 0335   VLDL 24 03/16/2018 0335    LDLCALC 112 (H) 03/16/2018 0335   Lab Results  Component Value Date   HGBA1C 7.7 (H) 03/16/2018   No results found for: LABOPIA, COCAINSCRNUR, LABBENZ, AMPHETMU, THCU, LABBARB  No results for input(s): ETH in the last 168 hours.  I have personally reviewed the radiological images below and agree with the radiology interpretations.  Ct Angio Head W Or Wo Contrast  Result Date: 03/16/2018 CLINICAL DATA:  Followup stroke in the right posterior frontal white matter. EXAM: CT ANGIOGRAPHY HEAD AND NECK TECHNIQUE: Multidetector CT imaging of the head and neck was performed using the standard protocol during bolus administration of intravenous contrast. Multiplanar CT image reconstructions and MIPs were obtained to evaluate the vascular anatomy. Carotid stenosis measurements (when applicable) are obtained utilizing NASCET criteria, using the distal internal carotid diameter as the denominator. CONTRAST:  53mL ISOVUE-370 IOPAMIDOL (ISOVUE-370) INJECTION 76% COMPARISON:  MRI yesterday. FINDINGS: CT HEAD FINDINGS Brain: Mild generalized atrophy. Moderate changes of chronic small vessel disease of the hemispheric white matter. Old pontine and thalamic strokes. Tiny low-density probably correlating with the acute stroke on MRI in the right corona radiata. No evidence of stroke extension or hemorrhage. No mass effect or shift. No hydrocephalus or extra-axial collection. Question benign dural calcifications  versus calcified meningioma along the sphenoid wing on the left. No significant mass-effect upon the brain. Vascular: There is atherosclerotic calcification of the major vessels at the base of the brain. Skull: Negative Sinuses: Clear Orbits: Normal Review of the MIP images confirms the above findings CTA NECK FINDINGS Aortic arch: Atherosclerotic calcification without aneurysm or dissection. Branching pattern of the brachiocephalic vessels is normal without origin stenosis. Right carotid system: Common carotid  artery is tortuous but widely patent to the bifurcation. There is calcified and soft plaque at the carotid bifurcation but the minimal diameter of the proximal ICA is 5 mm, this same as the more distal cervical ICA, therefore no stenosis. Left carotid system: Common carotid artery is mildly tortuous but widely patent to the bifurcation. Soft and calcified plaque at the bifurcation but no stenosis or irregularity. Cervical ICA tortuous but widely patent. Vertebral arteries: Left vertebral artery origin is patent. Left vertebral artery shows scattered foci of atherosclerotic plaque, with stenoses in the range of 30-40%, but the vessel is patent through the cervical region to the foramen magnum. 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 Skeleton: Minimal cervical spondylosis. Other neck: No mass or lymphadenopathy. Upper chest: Negative Review of the MIP images confirms the above findings CTA HEAD FINDINGS Anterior circulation: Both internal carotid arteries are patent through the skull base and siphon region. There is ordinary peripheral atherosclerotic calcification in the carotid siphon regions, with stenoses likely in the 30-50% range on each side. The anterior and middle cerebral vessels are patent without correctable proximal stenosis, aneurysm or vascular malformation. There is atherosclerotic narrowing and irregularity in the more distal branch vessels, particularly in the right superior division M2 region. This would place the patient at some risk of a right MCA territory infarction. No large vessel occlusion. Posterior circulation: The left vertebral artery is widely patent through the foramen magnum to the basilar. The right vertebral artery reconstitutes by cervical collaterals at the foramen magnum. There is calcified plaque at the foramen magnum with stenosis of 50-70%. Right PICA is visualized. Distal to that, the vessel is occluded again. No  basilar stenosis. Superior cerebellar arteries and posterior cerebral arteries are patent. There are stenoses in both PCA territories. Venous sinuses: Patent and normal Anatomic variants: None significant Delayed phase: No abnormal enhancement Review of the MIP images confirms the above findings IMPRESSION: No carotid bifurcation disease of significance. 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 Left vertebral artery sufficiently patent to the basilar. Posterior circulation branch vessels are patent but do show medium vessel stenoses. Atherosclerotic disease in both carotid siphon regions with serial stenoses in the range of 30-50% on each side. Superior division M2 stenosis on the right, placing the patient at risk of right MCA infarction. One could consider neuro interventional consultation regarding this stenosis, given the recent infarction. Electronically Signed   By: Nelson Chimes M.D.   On: 03/16/2018 16:03   Dg Chest 2 View  Result Date: 03/15/2018 CLINICAL DATA:  Chest pain EXAM: CHEST - 2 VIEW COMPARISON:  02/17/2011 FINDINGS: No pleural effusion. Moderate cardiomegaly increased compared to prior. No pleural effusion or focal consolidation. Mild vascular congestion. No pneumothorax. IMPRESSION: Moderate cardiomegaly with vascular congestion, increased compared to prior from 2012. Could consider component of pericardial effusion. Electronically Signed   By: Donavan Foil M.D.   On:  03/15/2018 19:01   Dg Lumbar Spine Complete  Result Date: 03/15/2018 CLINICAL DATA:  Fall EXAM: LUMBAR SPINE - COMPLETE 4+ VIEW COMPARISON:  CT 03/28/2018 FINDINGS: Lumbar alignment within normal limits. Mild degenerative changes at L3-L4 and L5-S1. Questionable mild superior endplate deformity at L1. Aortic atherosclerosis. IMPRESSION: 1. Possible mild superior endplate deformity at L1 2.  Degenerative changes. Electronically Signed   By: Donavan Foil M.D.   On: 03/15/2018 19:03   Ct Angio Neck W Or Wo Contrast  Result Date: 03/16/2018 CLINICAL DATA:  Followup stroke in the right posterior frontal white matter. EXAM: CT ANGIOGRAPHY HEAD AND NECK TECHNIQUE: Multidetector CT imaging of the head and neck was performed using the standard protocol during bolus administration of intravenous contrast. Multiplanar CT image reconstructions and MIPs were obtained to evaluate the vascular anatomy. Carotid stenosis measurements (when applicable) are obtained utilizing NASCET criteria, using the distal internal carotid diameter as the denominator. CONTRAST:  67mL ISOVUE-370 IOPAMIDOL (ISOVUE-370) INJECTION 76% COMPARISON:  MRI yesterday. FINDINGS: CT HEAD FINDINGS Brain: Mild generalized atrophy. Moderate changes of chronic small vessel disease of the hemispheric white matter. Old pontine and thalamic strokes. Tiny low-density probably correlating with the acute stroke on MRI in the right corona radiata. No evidence of stroke extension or hemorrhage. No mass effect or shift. No hydrocephalus or extra-axial collection. Question benign dural calcifications versus calcified meningioma along the sphenoid wing on the left. No significant mass-effect upon the brain. Vascular: There is atherosclerotic calcification of the major vessels at the base of the brain. Skull: Negative Sinuses: Clear Orbits: Normal Review of the MIP images confirms the above findings CTA NECK FINDINGS Aortic arch: Atherosclerotic calcification without aneurysm or dissection. Branching pattern of the brachiocephalic vessels is normal without origin stenosis. Right carotid system: Common carotid artery is tortuous but widely patent to the bifurcation. There is calcified and soft plaque at the carotid bifurcation but the minimal diameter of the proximal ICA is 5 mm, this same as the more distal cervical ICA, therefore no stenosis. Left carotid  system: Common carotid artery is mildly tortuous but widely patent to the bifurcation. Soft and calcified plaque at the bifurcation but no stenosis or irregularity. Cervical ICA tortuous but widely patent. Vertebral arteries: Left vertebral artery origin is patent. Left vertebral artery shows scattered foci of atherosclerotic plaque, with stenoses in the range of 30-40%, but the vessel is patent through the cervical region to the foramen magnum. 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 Skeleton: Minimal cervical spondylosis. Other neck: No mass or lymphadenopathy. Upper chest: Negative Review of the MIP images confirms the above findings CTA HEAD FINDINGS Anterior circulation: Both internal carotid arteries are patent through the skull base and siphon region. There is ordinary peripheral atherosclerotic calcification in the carotid siphon regions, with stenoses likely in the 30-50% range on each side. The anterior and middle cerebral vessels are patent without correctable proximal stenosis, aneurysm or vascular malformation. There is atherosclerotic narrowing and irregularity in the more distal branch vessels, particularly in the right superior division M2 region. This would place the patient at some risk of a right MCA territory infarction. No large vessel occlusion. Posterior circulation: The left vertebral artery is widely patent through the foramen magnum to the basilar. The right vertebral artery reconstitutes by cervical collaterals at the foramen magnum. There is calcified plaque at the foramen magnum with stenosis of 50-70%. Right PICA is visualized. Distal to that, the vessel is  occluded again. No basilar stenosis. Superior cerebellar arteries and posterior cerebral arteries are patent. There are stenoses in both PCA territories. Venous sinuses: Patent and normal Anatomic variants: None significant Delayed phase: No abnormal enhancement Review of  the MIP images confirms the above findings IMPRESSION: No carotid bifurcation disease of significance. 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 Left vertebral artery sufficiently patent to the basilar. Posterior circulation branch vessels are patent but do show medium vessel stenoses. Atherosclerotic disease in both carotid siphon regions with serial stenoses in the range of 30-50% on each side. Superior division M2 stenosis on the right, placing the patient at risk of right MCA infarction. One could consider neuro interventional consultation regarding this stenosis, given the recent infarction. Electronically Signed   By: Nelson Chimes M.D.   On: 03/16/2018 16:03   Mr Brain Wo Contrast  Result Date: 03/15/2018 CLINICAL DATA:  67 y/o F; generalized weakness, lightheadedness, and multiple falls. EXAM: MRI HEAD WITHOUT CONTRAST TECHNIQUE: Multiplanar, multiecho pulse sequences of the brain and surrounding structures were obtained without intravenous contrast. COMPARISON:  None. FINDINGS: Brain: 5 mm focus of reduced diffusion within the right mid corona radiata compatible with acute/early subacute infarction. No hemorrhage or mass effect. Very small chronic infarctions are present within the right cerebellar hemisphere and mid pons. Chronic lacunar infarctions are present within the left anterior limb of internal capsule and thalamus. Several patchynonspecific foci of T2 FLAIR hyperintense signal abnormality in subcortical and periventricular white matter are compatible withmoderatechronic microvascular ischemic changes for age. Moderatebrain parenchymal volume loss. Punctate foci of susceptibility hypointensity are present within the bilateral basal ganglia compatible with hemosiderin deposition of chronic microhemorrhage. Vascular: Normal flow voids. Skull and upper cervical  spine: Normal marrow signal. Sinuses/Orbits: Small right maxillary sinus mucous retention cyst. Otherwise negative. 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 IMPRESSION: 1. 5 mm acute/early subacute infarction within the right mid corona radiata. No associated hemorrhage or mass effect. 2. Moderate for age chronic microvascular ischemic changes and parenchymal volume loss of the brain. 3. Small chronic infarctions in the right cerebellum, mid pons, and left basal ganglia. 4. Foci of chronic microhemorrhage in central distribution likely related to chronic hypertension. These results were called by telephone at the time of interpretation on 03/15/2018 at 9:56 pm to Dr. Gareth Morgan , who verbally acknowledged these results. Electronically Signed   By: Kristine Garbe M.D.   On: 03/15/2018 21:58   TTE pending   PHYSICAL EXAM  Temp:  [98.3 F (36.8 C)-99.3 F (37.4 C)] 98.6 F (37 C) (04/26 1604) Pulse Rate:  [64-90] 70 (04/26 1604) Resp:  [16-25] 16 (04/26 1604) BP: (170-210)/(79-184) 193/82 (04/26 1604) SpO2:  [93 %-100 %] 100 % (04/26 1604) FiO2 (%):  [28 %] 28 % (04/25 2258) Weight:  [255 lb 15.3 oz (116.1 kg)] 255 lb 15.3 oz (116.1 kg) (04/26 0036)  General - Well nourished, well developed, in no apparent distress.  Ophthalmologic - fundi not visualized due to noncooperation.  Cardiovascular - Regular rate and rhythm with no murmur.  Mental Status -  Level of arousal and orientation to time, place, and person were intact. Language including expression, naming, repetition, comprehension was assessed and found intact. Fund of Knowledge was assessed and was intact.  Cranial Nerves II - XII - II - Visual field intact OU. III, IV, VI - Extraocular movements  intact. V - Facial sensation intact bilaterally. VII - Facial movement intact bilaterally. VIII - Hearing & vestibular intact bilaterally. X - Palate elevates symmetrically. XI - Chin  turning & shoulder shrug intact bilaterally. XII - Tongue protrusion intact.  Motor Strength - The patient's strength was 4/5 in all extremities and pronator drift was absent.  Bulk was normal and fasciculations were absent.   Motor Tone - Muscle tone was assessed at the neck and appendages and was normal.  Reflexes - The patient's reflexes were symmetrical in all extremities and she had no pathological reflexes.  Sensory - Light touch, temperature/pinprick were assessed and were symmetrical.    Coordination - The patient had normal movements in the hands with no ataxia or dysmetria.  Tremor was absent.  Gait and Station - walk with walker without assistance to bathroom, slow and small stride, no imbalance, no hemiparetic gait.   ASSESSMENT/PLAN Alicia [name] is a 67 y.o. female with history of DM, HLD, HTN, obesity and PAD admitted for lightheadedness and generalized weakness for several weeks. No tPA given due to OSW.    Stroke:  right small CR infarct likely secondary to small vessel disease source  Resultant generalized weakness for several weeks  MRI  Right small CR infarct, old right cerebellar, mid pons, left thalamus infarcts  CTA head and neck - right M2 stenosis, b/l siphon athero  2D Echo  pending  LDL 112  HgbA1c 7.7  lovenox for VTE prophylaxis Fall precautions  Diet heart healthy/carb modified Room service appropriate? Yes; Fluid consistency: Thin   aspirin 81 mg daily prior to admission, now on aspirin 325 mg daily and clopidogrel 75 mg daily. Continue DAPT for 3 months and then plavix alone due to intracranial stenosis.  Patient counseled to be compliant with her antithrombotic medications  Ongoing aggressive stroke risk factor management  Therapy recommendations:  CIR  Disposition:  Pending  Intracranial stenosis  CTA showed right M2 stenosis  Correlating to right CR infarct  Put on ASA 325mg  and plavix  Continue DAPT for 3 months and  then plavix alone   Diabetes  HgbA1c 7.7 goal < 7.0  Uncontrolled  CBG monitoring  hyperglycemia  SSI  DM education and close PCP follow up  Hypertension Still at high end On lasix, amlodipine, enalapril  Add labetalol 200mg  tid  Long term BP goal normotensive  Hyperlipidemia  Home meds:  crestor 10   LDL 112, goal < 70  Now on lipitor 40  Continue statin at discharge  Other Stroke Risk Factors  Advanced age  Obesity, Body mass index is 38.92 kg/m.   Hx stroke/TIA - on imaging  PAD  Other Active Problems  Elevated Cre - 1.16  Hospital day # 1  Neurology will sign off. Please call with questions. Pt will follow up with stroke clinic NP at [facility] in about 4 weeks. Thanks for the consult.   Rosalin Hawking, MD PhD Stroke Neurology 03/16/2018 5:29 PM    To contact Stroke Continuity provider, please refer to [email or web address] After hours, contact General Neurology

## 2018-03-16 NOTE — Progress Notes (Addendum)
PROGRESS NOTE    Alicia [name]  GYI:[id number] DOB: 06-03-1951 DOA: 03/15/2018 PCP: Iona Beard, MD   Brief Narrative: Patient is a 67 year old female with past medical history of hypertension, hyperlipidemia, obesity, peripheral vascular disease status post bypass surgery on the left lower extremity, diabetes type 2 who presented to the emergency department with complaints of dizziness and lightheadedness.  Patient was not able to ambulate at home.  She also complained of shortness of breath on exertion, leg swelling.  Chest x-ray on presentation showed cardiomegaly with vascular congestion.  MRI showed 5 mm acute/early subacute infarction within the right mid corona radiata.  Neurology consulted.  Assessment & Plan:   Principal Problem:   CVA (cerebral vascular accident) ([place]) Active Problems:   Peripheral vascular disease, unspecified ([place])   CHF exacerbation ([place])   Renal insufficiency   Hypertensive urgency   Diabetes mellitus type 2 in obese [facility])   CVA: MRI showed 5 mm acute/early subacute infarction within the right mid coronary radiata.  Neurology following.  Presented with generalized weakness .  Patient has multiple risk factors including hypertension which looks like uncontrolled, diabetes type 2. PT/OT following.  Recommended inpatient rehab.  LDL of 112 and hemoglobin of 7.7. Undergoing echocardiogram and CTA . Continue aspirin,plavix.  Hypertensive urgency: Patient's blood pressure improved.Will allow  permissive hypertension.  Continue her home meds amlodipine, enalapril.  Possible CHF exacerbation: Complained of shortness of breath.  Had mild edema on the bilateral lower extremities on presentation and chest x-ray showed increased vascular congestion. Will monitor daily weight, input and output. Continue IV lasix for now.  Chest pain: Resolved.  Troponin flat.  Diabetes mellitus type 2: On metformin 1 g twice daily at home.  Continue sliding scale insulin  here.  Hemoglobin A1c of 7.7.  Patient says she does not take her metformin regularly.  Counseled for adherence to her medications.  Peripheral vascular disease: Continue aspirin  Hyperlipidemia: Continue Lipitor   DVT prophylaxis: Lovenox Code Status: Full Family Communication: None present at the bedside Disposition Plan: Skilled nursing facility versus inpatient rehab   Consultants: Neurology  Procedures: None   Antimicrobials: None  Subjective: Patient seen and examined the bedside this morning.  Remains comfortable.  Complains of some weakness on the right lower extremity.  Denies any chest pain.  Objective: Vitals:   03/16/18 0420 03/16/18 0640 03/16/18 0908 03/16/18 1134  BP: (!) 190/93 (!) 176/86 (!) 189/86 (!) 188/97  Pulse: 81 64 70 73  Resp: (!) 22 18 18 20   Temp: 99 F (37.2 C)  98.4 F (36.9 C) 98.3 F (36.8 C)  TempSrc: Oral  Oral Oral  SpO2: 99% 98% 100% 98%  Weight:      Height:       No intake or output data in the 24 hours ending 03/16/18 1323 Filed Weights   03/16/18 0036  Weight: 116.1 kg (255 lb 15.3 oz)    Examination:  General exam: Appears calm and comfortable ,Not in distress,obese HEENT:PERRL,Oral mucosa moist, Ear/Nose normal on gross exam Respiratory system: Bilateral equal air entry, normal vesicular breath sounds, no wheezes or crackles  Cardiovascular system: S1 & S2 heard, RRR. No JVD, murmurs, rubs, gallops or clicks. No pedal edema. Gastrointestinal system: Abdomen is nondistended, soft and nontender. No organomegaly or masses felt. Normal bowel sounds heard. Central nervous system: Alert and oriented. No focal neurological deficits. Extremities: No edema, no clubbing ,no cyanosis, distal peripheral pulses palpable. Skin: No rashes, lesions or ulcers,no icterus ,no pallor MSK:  Normal muscle bulk,tone ,power Psychiatry: Judgement and insight appear normal. Mood & affect appropriate.     Data Reviewed: I have personally  reviewed following labs and imaging studies  CBC: Recent Labs  Lab 03/15/18 1133  WBC 6.7  HGB 12.9  HCT 39.7  MCV 94.1  PLT 458   Basic Metabolic Panel: Recent Labs  Lab 03/15/18 1133 03/16/18 0934  NA 139 140  K 3.6 3.7  CL 103 104  CO2 25 24  GLUCOSE 216* 231*  BUN 17 13  CREATININE 1.08* 1.16*  CALCIUM 8.6* 8.6*   GFR: Estimated Creatinine Clearance: 63 mL/min (A) (by C-G formula based on SCr of 1.16 mg/dL (H)). Liver Function Tests: Recent Labs  Lab 03/15/18 1806  AST 11*  ALT 12*  ALKPHOS 90  BILITOT 0.9  PROT 7.1  ALBUMIN 3.5   No results for input(s): LIPASE, AMYLASE in the last 168 hours. No results for input(s): AMMONIA in the last 168 hours. Coagulation Profile: No results for input(s): INR, PROTIME in the last 168 hours. Cardiac Enzymes: Recent Labs  Lab 03/15/18 1806 03/16/18 0335 03/16/18 0934  CKTOTAL 53  --   --   TROPONINI  --  <0.03 <0.03   BNP (last 3 results) No results for input(s): PROBNP in the last 8760 hours. HbA1C: Recent Labs    03/16/18 0335  HGBA1C 7.7*   CBG: Recent Labs  Lab 03/16/18 0650  GLUCAP 183*   Lipid Profile: Recent Labs    03/16/18 0335  CHOL 178  HDL 42  LDLCALC 112*  TRIG 119  CHOLHDL 4.2   Thyroid Function Tests: No results for input(s): TSH, T4TOTAL, FREET4, T3FREE, THYROIDAB in the last 72 hours. Anemia Panel: No results for input(s): VITAMINB12, FOLATE, FERRITIN, TIBC, IRON, RETICCTPCT in the last 72 hours. Sepsis Labs: No results for input(s): PROCALCITON, LATICACIDVEN in the last 168 hours.  No results found for this or any previous visit (from the past 240 hour(s)).       Radiology Studies: Dg Chest 2 View  Result Date: 03/15/2018 CLINICAL DATA:  Chest pain EXAM: CHEST - 2 VIEW COMPARISON:  02/17/2011 FINDINGS: No pleural effusion. Moderate cardiomegaly increased compared to prior. No pleural effusion or focal consolidation. Mild vascular congestion. No pneumothorax.  IMPRESSION: Moderate cardiomegaly with vascular congestion, increased compared to prior from 2012. Could consider component of pericardial effusion. Electronically Signed   By: Donavan Foil M.D.   On: 03/15/2018 19:01   Dg Lumbar Spine Complete  Result Date: 03/15/2018 CLINICAL DATA:  Fall EXAM: LUMBAR SPINE - COMPLETE 4+ VIEW COMPARISON:  CT 03/28/2018 FINDINGS: Lumbar alignment within normal limits. Mild degenerative changes at L3-L4 and L5-S1. Questionable mild superior endplate deformity at L1. Aortic atherosclerosis. IMPRESSION: 1. Possible mild superior endplate deformity at L1 2. Degenerative changes. Electronically Signed   By: Donavan Foil M.D.   On: 03/15/2018 19:03   Mr Brain Wo Contrast  Result Date: 03/15/2018 CLINICAL DATA:  68 y/o F; generalized weakness, lightheadedness, and multiple falls. EXAM: MRI HEAD WITHOUT CONTRAST TECHNIQUE: Multiplanar, multiecho pulse sequences of the brain and surrounding structures were obtained without intravenous contrast. COMPARISON:  None. FINDINGS: Brain: 5 mm focus of reduced diffusion within the right mid corona radiata compatible with acute/early subacute infarction. No hemorrhage or mass effect. Very small chronic infarctions are present within the right cerebellar hemisphere and mid pons. Chronic lacunar infarctions are present within the left anterior limb of internal capsule and thalamus. Several patchynonspecific foci of T2 FLAIR hyperintense signal  abnormality in subcortical and periventricular white matter are compatible withmoderatechronic microvascular ischemic changes for age. Moderatebrain parenchymal volume loss. Punctate foci of susceptibility hypointensity are present within the bilateral basal ganglia compatible with hemosiderin deposition of chronic microhemorrhage. Vascular: Normal flow voids. Skull and upper cervical spine: Normal marrow signal. Sinuses/Orbits: Small right maxillary sinus mucous retention cyst. Otherwise negative.  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 IMPRESSION: 1. 5 mm acute/early subacute infarction within the right mid corona radiata. No associated hemorrhage or mass effect. 2. Moderate for age chronic microvascular ischemic changes and parenchymal volume loss of the brain. 3. Small chronic infarctions in the right cerebellum, mid pons, and left basal ganglia. 4. Foci of chronic microhemorrhage in central distribution likely related to chronic hypertension. These results were called by telephone at the time of interpretation on 03/15/2018 at 9:56 pm to Dr. Gareth Morgan , who verbally acknowledged these results. Electronically Signed   By: Kristine Garbe M.D.   On: 03/15/2018 21:58        Scheduled Meds: . amLODipine  5 mg Oral QHS  . amLODipine  5 mg Oral Once  . [START ON 03/17/2018] aspirin EC  81 mg Oral Daily  . atorvastatin  40 mg Oral q1800  . clopidogrel  75 mg Oral Daily  . enalapril  20 mg Oral BID  . enoxaparin (LOVENOX) injection  40 mg Subcutaneous Daily  . furosemide  40 mg Intravenous BID  . insulin aspart  0-5 Units Subcutaneous QHS  . insulin aspart  0-9 Units Subcutaneous TID WC   Continuous Infusions:   LOS: 1 day    Time spent: 25 mins.More than 50% of that time was spent in counseling and/or coordination of care.      Shelly Coss, MD [facility] Pager [phone]  If 7PM-7AM, please contact night-coverage [email or web address] Password [email or web address] 03/16/2018, 1:23 PM

## 2018-03-17 LAB — BASIC METABOLIC PANEL
ANION GAP: 11 (ref 5–15)
BUN: 18 mg/dL (ref 6–20)
CALCIUM: 8.4 mg/dL — AB (ref 8.9–10.3)
CO2: 28 mmol/L (ref 22–32)
Chloride: 101 mmol/L (ref 101–111)
Creatinine, Ser: 1.15 mg/dL — ABNORMAL HIGH (ref 0.44–1.00)
GFR calc Af Amer: 56 mL/min — ABNORMAL LOW (ref >60)
GFR calc non Af Amer: 48 mL/min — ABNORMAL LOW (ref >60)
GLUCOSE: 141 mg/dL — AB (ref 65–99)
Potassium: 3.7 mmol/L (ref 3.5–5.1)
Sodium: 140 mmol/L (ref 135–145)

## 2018-03-17 LAB — GLUCOSE, CAPILLARY
GLUCOSE-CAPILLARY: 168 mg/dL — AB (ref 65–99)
Glucose-Capillary: 169 mg/dL — ABNORMAL HIGH (ref 65–99)
Glucose-Capillary: 143 mg/dL — ABNORMAL HIGH (ref 65–99)
Glucose-Capillary: 130 mg/dL — ABNORMAL HIGH (ref 65–99)

## 2018-03-17 LAB — MAGNESIUM: Magnesium: 1.9 mg/dL (ref 1.7–2.4)

## 2018-03-17 MED ORDER — FUROSEMIDE 40 MG PO TABS
40.0000 mg | ORAL_TABLET | Freq: Every day | ORAL | Status: DC
  Administered 2018-03-17 – 2018-03-18 (×2): 40 mg via ORAL
  Filled 2018-03-17 (×2): qty 1

## 2018-03-17 NOTE — Progress Notes (Signed)
PROGRESS NOTE    Alicia [name]  PNT:[id number] DOB: [birth year] DOA: 03/15/2018 PCP: Iona Beard, MD   Brief Narrative: Patient is a 67 year old female with past medical history of hypertension, hyperlipidemia, obesity, peripheral vascular disease status post bypass surgery on the left lower extremity, diabetes type 2 who presented to the emergency department with complaints of dizziness and lightheadedness.  Patient was not able to ambulate at home.  She also complained of shortness of breath on exertion, leg swelling.  Chest x-ray on presentation showed cardiomegaly with vascular congestion.  MRI showed 5 mm acute/early subacute infarction within the right mid corona radiata.  Neurology consulted. Patient is being monitored by inpatient rehab.  Assessment & Plan:   Principal Problem:   CVA (cerebral vascular accident) ([place]) Active Problems:   Hyperlipidemia   Peripheral vascular disease, unspecified ([place])   CHF exacerbation ([place])   Renal insufficiency   Hypertensive urgency   Diabetes mellitus type 2 in obese ([place])   Morbid obesity ([place])   CVA: MRI showed 5 mm acute/early subacute infarction within the right mid coronary radiata.  Neurology following.  Presented with generalized weakness .  Patient has multiple risk factors including hypertension which looks like uncontrolled, diabetes type 2. PT/OT following.  Recommended inpatient rehab.  Inpatient rehab following but anticipating that she will be okay to home with home health.  LDL of 112 and hemoglobin of 7.7. Underwent echocardiogram which shows ejection fraction 55 to 60%, mild left ventricular hypertrophy, no wall motion abnormality, grade 1 diastolic dysfunction. Continue aspirin 325,plavix.  Plan is to continue on dual antiplatelet therapy for 3 months and then Plavix alone. CTA head and neck done showed right M2 stenosis correlating to right coronary radiata infarction. She will follow-up with neurology as an  outpatient.  Hypertensive urgency: Patient's blood pressure improved..  Continue her home meds amlodipine, enalapril.  Added labetalol.  Possible CHF exacerbation: Complained of shortness of breath.  Had mild edema on the bilateral lower extremities on presentation and chest x-ray showed increased vascular congestion. Has improved.  Lasix changed to oral. Echocardiogram showed grade 1 diastolic dysfunction.  Chest pain: Resolved.  Troponin flat.  Diabetes mellitus type 2: On metformin 1 g twice daily at home.  Continue sliding scale insulin here.  Hemoglobin A1c of 7.7.  Patient says she does not take her metformin regularly.  Counseled for adherence to her medications.  Peripheral vascular disease: Continue aspirin  Hyperlipidemia: Continue Lipitor 40 mg daily   DVT prophylaxis: Lovenox Code Status: Full Family Communication: None present at the bedside Disposition Plan: Likely home health physical therapy but waiting for final CIR recommendation.   Consultants: Neurology  Procedures: None   Antimicrobials: None  Subjective: Patient seen and examined the bedside this morning.  Remains comfortable.  No new issues/events. Objective: Vitals:   03/17/18 0443 03/17/18 0500 03/17/18 0924 03/17/18 1021  BP: 140/76  (!) 172/65 (!) 176/65  Pulse: 71  68 (!) 53  Resp: 18  17 17   Temp: 98.7 F (37.1 C)   98.7 F (37.1 C)  TempSrc: Oral   Oral  SpO2: 97%  96% 94%  Weight:  115.2 kg (253 lb 15.5 oz)    Height:       No intake or output data in the 24 hours ending 03/17/18 1304 Filed Weights   03/16/18 0036 03/17/18 0500  Weight: 116.1 kg (255 lb 15.3 oz) 115.2 kg (253 lb 15.5 oz)    Examination:  General exam: Appears calm and comfortable ,  Not in distress,obese HEENT:PERRL,Oral mucosa moist, Ear/Nose normal on gross exam Respiratory system: Bilateral equal air entry, normal vesicular breath sounds, no wheezes or crackles  Cardiovascular system: S1 & S2 heard, RRR. No JVD,  murmurs, rubs, gallops or clicks. Gastrointestinal system: Abdomen is nondistended, soft and nontender. No organomegaly or masses felt. Normal bowel sounds heard. Central nervous system: Alert and oriented. No focal neurological deficits. Extremities: No edema, no clubbing ,no cyanosis, distal peripheral pulses palpable. Skin: No rashes, lesions or ulcers,no icterus ,no pallor MSK: Normal muscle bulk,tone ,power Psychiatry: Judgement and insight appear normal. Mood & affect appropriate.      Data Reviewed: I have personally reviewed following labs and imaging studies  CBC: Recent Labs  Lab 03/15/18 1133  WBC 6.7  HGB 12.9  HCT 39.7  MCV 94.1  PLT 098   Basic Metabolic Panel: Recent Labs  Lab 03/15/18 1133 03/16/18 0934 03/17/18 0521 03/17/18 0930  NA 139 140 140  --   K 3.6 3.7 3.7  --   CL 103 104 101  --   CO2 25 24 28   --   GLUCOSE 216* 231* 141*  --   BUN 17 13 18   --   CREATININE 1.08* 1.16* 1.15*  --   CALCIUM 8.6* 8.6* 8.4*  --   MG  --   --   --  1.9   GFR: Estimated Creatinine Clearance: 63.2 mL/min (A) (by C-G formula based on SCr of 1.15 mg/dL (H)). Liver Function Tests: Recent Labs  Lab 03/15/18 1806  AST 11*  ALT 12*  ALKPHOS 90  BILITOT 0.9  PROT 7.1  ALBUMIN 3.5   No results for input(s): LIPASE, AMYLASE in the last 168 hours. No results for input(s): AMMONIA in the last 168 hours. Coagulation Profile: No results for input(s): INR, PROTIME in the last 168 hours. Cardiac Enzymes: Recent Labs  Lab 03/15/18 1806 03/16/18 0335 03/16/18 0934  CKTOTAL 53  --   --   TROPONINI  --  <0.03 <0.03   BNP (last 3 results) No results for input(s): PROBNP in the last 8760 hours. HbA1C: Recent Labs    03/16/18 0335  HGBA1C 7.7*   CBG: Recent Labs  Lab 03/16/18 1559 03/16/18 1706 03/16/18 2157 03/17/18 0615 03/17/18 1138  GLUCAP 118* 95 127* 169* 168*   Lipid Profile: Recent Labs    03/16/18 0335  CHOL 178  HDL 42  LDLCALC 112*    TRIG 119  CHOLHDL 4.2   Thyroid Function Tests: No results for input(s): TSH, T4TOTAL, FREET4, T3FREE, THYROIDAB in the last 72 hours. Anemia Panel: No results for input(s): VITAMINB12, FOLATE, FERRITIN, TIBC, IRON, RETICCTPCT in the last 72 hours. Sepsis Labs: No results for input(s): PROCALCITON, LATICACIDVEN in the last 168 hours.  Recent Results (from the past 240 hour(s))  Urine culture     Status: Abnormal   Collection Time: 03/15/18  4:34 PM  Result Value Ref Range Status   Specimen Description URINE, RANDOM  Final   Special Requests   Final    NONE Performed at [facility], [address], [place], [place] [address]    Culture MULTIPLE SPECIES PRESENT, SUGGEST RECOLLECTION (A)  Final   Report Status 03/16/2018 FINAL  Final         Radiology Studies: Ct Angio Head W Or Wo Contrast  Result Date: 03/16/2018 CLINICAL DATA:  Followup stroke in the right posterior frontal white matter. EXAM: CT ANGIOGRAPHY HEAD AND NECK TECHNIQUE: Multidetector CT imaging of  the head and neck was performed using the standard protocol during bolus administration of intravenous contrast. Multiplanar CT image reconstructions and MIPs were obtained to evaluate the vascular anatomy. Carotid stenosis measurements (when applicable) are obtained utilizing NASCET criteria, using the distal internal carotid diameter as the denominator. CONTRAST:  1mL ISOVUE-370 IOPAMIDOL (ISOVUE-370) INJECTION 76% COMPARISON:  MRI yesterday. FINDINGS: CT HEAD FINDINGS Brain: Mild generalized atrophy. Moderate changes of chronic small vessel disease of the hemispheric white matter. Old pontine and thalamic strokes. Tiny low-density probably correlating with the acute stroke on MRI in the right corona radiata. No evidence of stroke extension or hemorrhage. No mass effect or shift. No hydrocephalus or extra-axial collection. Question benign dural calcifications versus calcified meningioma along the sphenoid wing on the  left. No significant mass-effect upon the brain. Vascular: There is atherosclerotic calcification of the major vessels at the base of the brain. Skull: Negative Sinuses: Clear Orbits: Normal Review of the MIP images confirms the above findings CTA NECK FINDINGS Aortic arch: Atherosclerotic calcification without aneurysm or dissection. Branching pattern of the brachiocephalic vessels is normal without origin stenosis. Right carotid system: Common carotid artery is tortuous but widely patent to the bifurcation. There is calcified and soft plaque at the carotid bifurcation but the minimal diameter of the proximal ICA is 5 mm, this same as the more distal cervical ICA, therefore no stenosis. Left carotid system: Common carotid artery is mildly tortuous but widely patent to the bifurcation. Soft and calcified plaque at the bifurcation but no stenosis or irregularity. Cervical ICA tortuous but widely patent. Vertebral arteries: Left vertebral artery origin is patent. Left vertebral artery shows scattered foci of atherosclerotic plaque, with stenoses in the range of 30-40%, but the vessel is patent through the cervical region to the foramen magnum. 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 Skeleton: Minimal cervical spondylosis. Other neck: No mass or lymphadenopathy. Upper chest: Negative Review of the MIP images confirms the above findings CTA HEAD FINDINGS Anterior circulation: Both internal carotid arteries are patent through the skull base and siphon region. There is ordinary peripheral atherosclerotic calcification in the carotid siphon regions, with stenoses likely in the 30-50% range on each side. The anterior and middle cerebral vessels are patent without correctable proximal stenosis, aneurysm or vascular malformation. There is atherosclerotic narrowing and irregularity in the more distal branch vessels, particularly in the right superior division M2  region. This would place the patient at some risk of a right MCA territory infarction. No large vessel occlusion. Posterior circulation: The left vertebral artery is widely patent through the foramen magnum to the basilar. The right vertebral artery reconstitutes by cervical collaterals at the foramen magnum. There is calcified plaque at the foramen magnum with stenosis of 50-70%. Right PICA is visualized. Distal to that, the vessel is occluded again. No basilar stenosis. Superior cerebellar arteries and posterior cerebral arteries are patent. There are stenoses in both PCA territories. Venous sinuses: Patent and normal Anatomic variants: None significant Delayed phase: No abnormal enhancement Review of the MIP images confirms the above findings IMPRESSION: No carotid bifurcation disease of significance. 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 Left vertebral artery sufficiently patent to the basilar. Posterior circulation branch vessels are patent but do show medium vessel stenoses. Atherosclerotic disease in both carotid siphon regions with serial stenoses in the range of  30-50% on each side. Superior division M2 stenosis on the right, placing the patient at risk of right MCA infarction. One could consider neuro interventional consultation regarding this stenosis, given the recent infarction. Electronically Signed   By: Nelson Chimes M.D.   On: 03/16/2018 16:03   Dg Chest 2 View  Result Date: 03/15/2018 CLINICAL DATA:  Chest pain EXAM: CHEST - 2 VIEW COMPARISON:  02/17/2011 FINDINGS: No pleural effusion. Moderate cardiomegaly increased compared to prior. No pleural effusion or focal consolidation. Mild vascular congestion. No pneumothorax. IMPRESSION: Moderate cardiomegaly with vascular congestion, increased compared to prior from 2012. Could consider component of pericardial  effusion. Electronically Signed   By: Donavan Foil M.D.   On: 03/15/2018 19:01   Dg Lumbar Spine Complete  Result Date: 03/15/2018 CLINICAL DATA:  Fall EXAM: LUMBAR SPINE - COMPLETE 4+ VIEW COMPARISON:  CT 03/28/2018 FINDINGS: Lumbar alignment within normal limits. Mild degenerative changes at L3-L4 and L5-S1. Questionable mild superior endplate deformity at L1. Aortic atherosclerosis. IMPRESSION: 1. Possible mild superior endplate deformity at L1 2. Degenerative changes. Electronically Signed   By: Donavan Foil M.D.   On: 03/15/2018 19:03   Ct Angio Neck W Or Wo Contrast  Result Date: 03/16/2018 CLINICAL DATA:  Followup stroke in the right posterior frontal white matter. EXAM: CT ANGIOGRAPHY HEAD AND NECK TECHNIQUE: Multidetector CT imaging of the head and neck was performed using the standard protocol during bolus administration of intravenous contrast. Multiplanar CT image reconstructions and MIPs were obtained to evaluate the vascular anatomy. Carotid stenosis measurements (when applicable) are obtained utilizing NASCET criteria, using the distal internal carotid diameter as the denominator. CONTRAST:  6mL ISOVUE-370 IOPAMIDOL (ISOVUE-370) INJECTION 76% COMPARISON:  MRI yesterday. FINDINGS: CT HEAD FINDINGS Brain: Mild generalized atrophy. Moderate changes of chronic small vessel disease of the hemispheric white matter. Old pontine and thalamic strokes. Tiny low-density probably correlating with the acute stroke on MRI in the right corona radiata. No evidence of stroke extension or hemorrhage. No mass effect or shift. No hydrocephalus or extra-axial collection. Question benign dural calcifications versus calcified meningioma along the sphenoid wing on the left. No significant mass-effect upon the brain. Vascular: There is atherosclerotic calcification of the major vessels at the base of the brain. Skull: Negative Sinuses: Clear Orbits: Normal Review of the MIP images confirms the above findings CTA  NECK FINDINGS Aortic arch: Atherosclerotic calcification without aneurysm or dissection. Branching pattern of the brachiocephalic vessels is normal without origin stenosis. Right carotid system: Common carotid artery is tortuous but widely patent to the bifurcation. There is calcified and soft plaque at the carotid bifurcation but the minimal diameter of the proximal ICA is 5 mm, this same as the more distal cervical ICA, therefore no stenosis. Left carotid system: Common carotid artery is mildly tortuous but widely patent to the bifurcation. Soft and calcified plaque at the bifurcation but no stenosis or irregularity. Cervical ICA tortuous but widely patent. Vertebral arteries: Left vertebral artery origin is patent. Left vertebral artery shows scattered foci of atherosclerotic plaque, with stenoses in the range of 30-40%, but the vessel is patent through the cervical region to the foramen magnum. 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 Skeleton: Minimal cervical spondylosis. Other neck: No mass or lymphadenopathy. Upper chest: Negative Review of the MIP images confirms the above findings CTA HEAD FINDINGS Anterior circulation: Both internal carotid arteries are patent through the skull base and siphon region. There is ordinary peripheral  atherosclerotic calcification in the carotid siphon regions, with stenoses likely in the 30-50% range on each side. The anterior and middle cerebral vessels are patent without correctable proximal stenosis, aneurysm or vascular malformation. There is atherosclerotic narrowing and irregularity in the more distal branch vessels, particularly in the right superior division M2 region. This would place the patient at some risk of a right MCA territory infarction. No large vessel occlusion. Posterior circulation: The left vertebral artery is widely patent through the foramen magnum to the basilar. The right vertebral artery  reconstitutes by cervical collaterals at the foramen magnum. There is calcified plaque at the foramen magnum with stenosis of 50-70%. Right PICA is visualized. Distal to that, the vessel is occluded again. No basilar stenosis. Superior cerebellar arteries and posterior cerebral arteries are patent. There are stenoses in both PCA territories. Venous sinuses: Patent and normal Anatomic variants: None significant Delayed phase: No abnormal enhancement Review of the MIP images confirms the above findings IMPRESSION: No carotid bifurcation disease of significance. 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 Left vertebral artery sufficiently patent to the basilar. Posterior circulation branch vessels are patent but do show medium vessel stenoses. Atherosclerotic disease in both carotid siphon regions with serial stenoses in the range of 30-50% on each side. Superior division M2 stenosis on the right, placing the patient at risk of right MCA infarction. One could consider neuro interventional consultation regarding this stenosis, given the recent infarction. Electronically Signed   By: Nelson Chimes M.D.   On: 03/16/2018 16:03   Mr Brain Wo Contrast  Result Date: 03/15/2018 CLINICAL DATA:  67 y/o F; generalized weakness, lightheadedness, and multiple falls. EXAM: MRI HEAD WITHOUT CONTRAST TECHNIQUE: Multiplanar, multiecho pulse sequences of the brain and surrounding structures were obtained without intravenous contrast. COMPARISON:  None. FINDINGS: Brain: 5 mm focus of reduced diffusion within the right mid corona radiata compatible with acute/early subacute infarction. No hemorrhage or mass effect. Very small chronic infarctions are present within the right cerebellar hemisphere and mid pons. Chronic lacunar infarctions are present within the left anterior limb of internal capsule and  thalamus. Several patchynonspecific foci of T2 FLAIR hyperintense signal abnormality in subcortical and periventricular white matter are compatible withmoderatechronic microvascular ischemic changes for age. Moderatebrain parenchymal volume loss. Punctate foci of susceptibility hypointensity are present within the bilateral basal ganglia compatible with hemosiderin deposition of chronic microhemorrhage. Vascular: Normal flow voids. Skull and upper cervical spine: Normal marrow signal. Sinuses/Orbits: Small right maxillary sinus mucous retention cyst. Otherwise negative. 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 IMPRESSION: 1. 5 mm acute/early subacute infarction within the right mid corona radiata. No associated hemorrhage or mass effect. 2. Moderate for age chronic microvascular ischemic changes and parenchymal volume loss of the brain. 3. Small chronic infarctions in the right cerebellum, mid pons, and left basal ganglia. 4. Foci of chronic microhemorrhage in central distribution likely related to chronic hypertension. These results were called by telephone at the time of interpretation on 03/15/2018 at 9:56 pm to Dr. Gareth Morgan , who verbally acknowledged these results. Electronically Signed   By: Kristine Garbe M.D.   On: 03/15/2018 21:58        Scheduled Meds: . amLODipine  10 mg Oral QHS  . aspirin EC  325 mg Oral Daily  . atorvastatin  40 mg Oral q1800  . clopidogrel  75 mg Oral  Daily  . enalapril  20 mg Oral BID  . enoxaparin (LOVENOX) injection  40 mg Subcutaneous Daily  . furosemide  40 mg Oral Daily  . insulin aspart  0-5 Units Subcutaneous QHS  . insulin aspart  0-9 Units Subcutaneous TID WC  . labetalol  200 mg Oral TID   Continuous Infusions:   LOS: 2 days    Time spent: 25 mins.More than 50% of that time was spent in counseling and/or coordination of care.      Shelly Coss, MD [facility] Pager  [phone]  If 7PM-7AM, please contact night-coverage [email or web address] Password [place] [facility] 03/17/2018, 1:04 PM

## 2018-03-17 NOTE — Evaluation (Signed)
Speech Language Pathology Evaluation Patient Details Name: Alicia [name] MRN: [id number] DOB: 01/24/1951 Today's Date: 03/17/2018 Time: 4270-6237 SLP Time Calculation (min) (ACUTE ONLY): 24 min  Problem List:  Patient Active Problem List   Diagnosis Date Noted  . CHF exacerbation ([place]) 03/16/2018  . Renal insufficiency 03/16/2018  . Hypertensive urgency 03/16/2018  . Diabetes mellitus type 2 in obese ([place]) 03/16/2018  . Morbid obesity ([place])   . CVA (cerebral vascular accident) ([place]) 03/15/2018  . Numbness in both hands- and Feet 06/06/2014  . Weakness of both legs 06/06/2014  . Swelling of limb-Left Leg > Right 06/06/2014  . Peripheral vascular disease, unspecified ([place]) 11/30/2012  . Pain in limb 11/30/2012  . Atherosclerosis of native arteries of the extremities with intermittent claudication 04/06/2012  . CHEST PAIN 01/03/2011  . Hyperlipidemia 07/20/2007  . HYPERTENSION 07/20/2007  . ARTHRITIS 07/20/2007   Past Medical History:  Past Medical History:  Diagnosis Date  . Arthritis   . Chronic headaches   . Constipation   . Depression   . Diabetes mellitus   . Hyperlipidemia   . Hypertension   . Obesity   . PAD (peripheral artery disease) ([place])   . Shortness of breath    Past Surgical History:  Past Surgical History:  Procedure Laterality Date  . FEMORAL BYPASS  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 Bridgett Larsson   HPI:  67 y.o. female presenting to the ED with generalized weakness and lightheadedness, in conjunction with 2 falls in the past few days. 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 PMH: arthritis;DM;depression; PAD; HTN; obesity   Assessment / Plan / Recommendation Clinical Impression  Patient presents with cognitive-linguistic skills within normal limits. Pt scored 29/30 (30/30 when adjusted for level of education)  on [place] (>26 is considered WNL). Supplemental clock drawing reveals mild disorganization, however pt aware and able to self-correct with extended time. Pt able to state appropriate actions to take should she note stroke symptoms in the future (call 911 vs drive to hospital). SLP inquired re: bill-paying, money management. Pt able to complete functional financial scenarios adequately; she reports she paid a bill over the phone that was due today. SLP educated re: sequelae of CVA with regard to cognition; pt feels she is at her functional baseline. No further skilled ST recommended at this time. SLP will s/o.     SLP Assessment  SLP Recommendation/Assessment: Patient does not need any further Speech Lanaguage Pathology Services SLP Visit Diagnosis: Cognitive communication deficit (R41.841)    Follow Up Recommendations  None    Frequency and Duration           SLP Evaluation Cognition  Overall Cognitive Status: Within Functional Limits for tasks assessed Arousal/Alertness: Awake/alert Orientation Level: Oriented X4 Attention: Focused;Sustained;Selective Focused Attention: Appears intact Sustained Attention: Appears intact Selective Attention: Appears intact Memory: Appears intact Awareness: Appears intact Problem Solving: Appears intact Executive Function: Reasoning;Organizing;Self Monitoring;Self Correcting Reasoning: Appears intact Organizing: Impaired Organizing Impairment: Functional basic(clock drawing impaired; pt aware) Self Monitoring: Appears intact Self Correcting: Appears intact(corrects clock drawing errors ) Safety/Judgment: Other (comment)(with ? cue states she shouldn't have driven to hospital)       Comprehension  Auditory Comprehension Overall Auditory Comprehension: Appears within functional limits for tasks assessed Yes/No Questions: Within Functional Limits Commands: Within Functional Limits Conversation: Complex Visual  Recognition/Discrimination Discrimination: Within Function Limits Reading Comprehension Reading  Status: Not tested    Expression Expression Primary Mode of Expression: Verbal Verbal Expression Overall Verbal Expression: Appears within functional limits for tasks assessed Repetition: No impairment Naming: No impairment Pragmatics: No impairment Written Expression Dominant Hand: Right Written Expression: Not tested   Oral / Motor  Oral Motor/Sensory Function Overall Oral Motor/Sensory Function: Within functional limits Motor Speech Overall Motor Speech: Appears within functional limits for tasks assessed Respiration: Within functional limits Phonation: Normal Resonance: Within functional limits Articulation: Within functional limitis Intelligibility: Intelligible Motor Planning: Witnin functional limits Motor Speech Errors: Not applicable   [place], [place], [place] Pathologist [phone]  Aliene Altes 03/17/2018, 3:11 PM

## 2018-03-17 NOTE — Progress Notes (Signed)
Late entry for 0652: patient had 4 beat run of v tach 150bpm

## 2018-03-18 LAB — GLUCOSE, CAPILLARY
GLUCOSE-CAPILLARY: 135 mg/dL — AB (ref 65–99)
GLUCOSE-CAPILLARY: 186 mg/dL — AB (ref 65–99)

## 2018-03-18 LAB — BASIC METABOLIC PANEL
Anion gap: 8 (ref 5–15)
BUN: 20 mg/dL (ref 6–20)
CALCIUM: 8.4 mg/dL — AB (ref 8.9–10.3)
CO2: 28 mmol/L (ref 22–32)
CREATININE: 1.29 mg/dL — AB (ref 0.44–1.00)
Chloride: 104 mmol/L (ref 101–111)
GFR, EST AFRICAN AMERICAN: 49 mL/min — AB (ref >60)
GFR, EST NON AFRICAN AMERICAN: 42 mL/min — AB (ref >60)
Glucose, Bld: 154 mg/dL — ABNORMAL HIGH (ref 65–99)
Potassium: 4.1 mmol/L (ref 3.5–5.1)
SODIUM: 140 mmol/L (ref 135–145)

## 2018-03-18 MED ORDER — METFORMIN HCL 1000 MG PO TABS: 1000.0000 mg | ORAL_TABLET | Freq: Two times a day (BID) | ORAL | 0 refills | Status: DC

## 2018-03-18 MED ORDER — LABETALOL HCL 200 MG PO TABS: 200.0000 mg | ORAL_TABLET | Freq: Three times a day (TID) | ORAL | 0 refills | Status: DC

## 2018-03-18 MED ORDER — CLOPIDOGREL BISULFATE 75 MG PO TABS: 75.0000 mg | ORAL_TABLET | Freq: Every day | ORAL | 0 refills | Status: DC

## 2018-03-18 MED ORDER — ASPIRIN 325 MG PO TBEC: 325.0000 mg | DELAYED_RELEASE_TABLET | Freq: Every day | ORAL | 0 refills | Status: DC

## 2018-03-18 MED ORDER — ATORVASTATIN CALCIUM 40 MG PO TABS: 40.0000 mg | ORAL_TABLET | Freq: Every day | ORAL | 0 refills | Status: DC

## 2018-03-18 MED ORDER — NIFEDIPINE ER 60 MG PO TB24: 60.0000 mg | ORAL_TABLET | Freq: Every day | ORAL | 0 refills | Status: DC

## 2018-03-18 NOTE — Care Management Note (Signed)
Case Management Note  Patient Details  Name: Alicia [name] MRN: [id number] Date of Birth: [birth year]  Subjective/Objective:                    Action/Plan: Pt discharging home with [facility] services. CM provided her choice and she selected [facility]. Jermaine with [facility] notified and accepted the referral.  Pt with orders for walker and 3 in 1. Jermaine made aware and will deliver the equipment to the room. Pt drove herself to the hospital and has no one to get her home. CSW updated and will provide a cab voucher.    Expected Discharge Date:  03/18/18               Expected Discharge Plan:  [place]  In-House Referral:     Discharge planning Services  CM Consult  Post Acute Care Choice:  Durable Medical Equipment, Home Health Choice offered to:  Patient  DME Arranged:  3-N-1, Walker rolling DME Agency:  [place] Arranged:  PT, OT [facility] Agency:  [place]  Status of Service:  Completed, signed off  If discussed at [place] of Stay Meetings, dates discussed:    Additional Comments:  Pollie Friar, RN 03/18/2018, 2:19 PM

## 2018-03-18 NOTE — Discharge Summary (Signed)
Physician Discharge Summary  Alicia [name] TOI:[id number] DOB: 1951-06-16 DOA: 03/15/2018  PCP: Iona Beard, MD  Admit date: 03/15/2018 Discharge date: 03/18/2018  Admitted From: Home Disposition:  Home  Discharge Condition:Stable CODE STATUS:FULL Diet recommendation: Heart Healthy  Brief/Interim Summary: Patient is a 67 year old female with past medical history of hypertension, hyperlipidemia, obesity, peripheral vascular disease status post bypass surgery on the left lower extremity, diabetes type 2 who presented to the emergency department with complaints of dizziness and lightheadedness.  Patient was not able to ambulate at home.  She also complained of shortness of breath on exertion, leg swelling.  Chest x-ray on presentation showed cardiomegaly with vascular congestion.  MRI showed 5 mm acute/early subacute infarction within the right mid corona radiata.  Neurology consulted. Patient was initially recommended inpatient rehab by physical therapy.  But her mobility significantly improve during the hospitalization.  Patient was reevaluated by physical therapy and recommended home health. She is stable to be discharged home today.  She needs to follow-up with neurology as an outpatient.  Home health has been arranged.  Following problems were addressed during her hospitalization:  CVA: MRI showed 5 mm acute/early subacute infarction within the right mid coronary radiata.  Neurology was following.  Presented with generalized weakness .  Patient has multiple risk factors including hypertension which looks like uncontrolled, diabetes type 2. PT/OT following.  She is going to be discharged to home with home health.  LDL of 112 and hemoglobin of 7.7. Underwent echocardiogram which shows ejection fraction 55 to 60%, mild left ventricular hypertrophy, no wall motion abnormality, grade 1 diastolic dysfunction. Continue aspirin 325,plavix.  Plan is to continue on dual antiplatelet therapy  for 3 months and then Plavix alone. CTA head and neck done showed right M2 stenosis correlating to right coronary radiata infarction. She will follow-up with neurology as an outpatient.  Hypertensive urgency: Patient's blood pressure improved.  We changed her blood pressure medications.  She will be on nifedipine, hydrochlorothiazide,  enalapril and labetalol .she needs to monitor her blood pressure at home.  She needs to follow-up with her PCP  .  Possible CHF exacerbation: Complained of shortness of breath.  Had mild edema on the bilateral lower extremities on presentation and chest x-ray showed increased vascular congestion. Echocardiogram showed grade 1 diastolic dysfunction. Continue HCTZ  Chest pain: Resolved.  Troponin flat.  Diabetes mellitus type 2: On metformin 1 g twice daily and empagliflozin at home. Hemoglobin A1c of 7.7.  Patient says she does not take her metformin regularly.  Counseled for adherence to her medications.  Peripheral vascular disease: Continue aspirin,statin,plavix  Hyperlipidemia: Continue Lipitor 40 mg daily  CKD stage 2:Kidney function close to her baseline.  Check BMP in a week.     Discharge Diagnoses:  Principal Problem:   CVA (cerebral vascular accident) [facility]) Active Problems:   Hyperlipidemia   Peripheral vascular disease, unspecified ([facility])   CHF exacerbation ([place])   Renal insufficiency   Hypertensive urgency   Diabetes mellitus type 2 in obese [facility])   Morbid obesity [facility])    Discharge Instructions  Discharge Instructions    Ambulatory referral to Neurology   Complete by:  As directed    Follow up with stroke clinic NP (Jessica Vanschaick or Cecille Rubin, if both not available, consider Zachery Dauer, or Ahern) at [facility] in about 4 weeks. Thanks.   Diet - low sodium heart healthy   Complete by:  As directed    Discharge instructions   Complete by:  As directed    1) Please follow-up with your PMD in a week.  Do a CBC and BMP  testing in a week during the follow-up. 2) Take prescribed medications as instructed. 3) Follow the neurology as an outpatient.Name and number of the provider group has been attached. 4) Please monitor your blood pressure at home.  Monitor your blood sugars.  Check hemoglobin A1c in 3 months.   Increase activity slowly   Complete by:  As directed      Allergies as of 03/18/2018   No Known Allergies     Medication List    STOP taking these medications   amLODipine 5 MG tablet Commonly known as:  NORVASC   aspirin 81 MG tablet Replaced by:  aspirin 325 MG EC tablet   cyclobenzaprine 10 MG tablet Commonly known as:  FLEXERIL   ENTRESTO 97-103 MG Generic drug:  sacubitril-valsartan   ibuprofen 800 MG tablet Commonly known as:  ADVIL,MOTRIN   indapamide 1.25 MG tablet Commonly known as:  LOZOL   metroNIDAZOLE 250 MG tablet Commonly known as:  FLAGYL   nystatin cream Commonly known as:  MYCOSTATIN   nystatin powder Generic drug:  nystatin   predniSONE 20 MG tablet Commonly known as:  DELTASONE   rosuvastatin 10 MG tablet Commonly known as:  CRESTOR     TAKE these medications   aspirin 325 MG EC tablet Take 1 tablet (325 mg total) by mouth daily. Start taking on:  03/19/2018 Replaces:  aspirin 81 MG tablet   atorvastatin 40 MG tablet Commonly known as:  LIPITOR Take 1 tablet (40 mg total) by mouth daily at 6 PM.   clopidogrel 75 MG tablet Commonly known as:  PLAVIX Take 1 tablet (75 mg total) by mouth daily. Start taking on:  03/19/2018   diazepam 5 MG tablet Commonly known as:  VALIUM Take 1 tablet (5 mg total) by mouth 2 (two) times daily.   enalapril 20 MG tablet Commonly known as:  VASOTEC Take 20 mg by mouth 2 (two) times daily.   famotidine 20 MG tablet Commonly known as:  PEPCID Take 20 mg by mouth as needed.   hydrochlorothiazide 25 MG tablet Commonly known as:  HYDRODIURIL Take 25 mg by mouth daily.   JARDIANCE 10 MG Tabs tablet Generic  drug:  empagliflozin Take 10 mg by mouth daily.   labetalol 200 MG tablet Commonly known as:  NORMODYNE Take 1 tablet (200 mg total) by mouth 3 (three) times daily.   metFORMIN 1000 MG tablet Commonly known as:  GLUCOPHAGE Take 1 tablet (1,000 mg total) by mouth 2 (two) times daily with a meal.   NIFEdipine 60 MG 24 hr tablet Commonly known as:  PROCARDIA-XL/ADALAT CC Take 1 tablet (60 mg total) by mouth daily. What changed:  when to take this   oxyCODONE-acetaminophen 5-325 MG tablet Commonly known as:  PERCOCET/ROXICET Take 1 tablet by mouth every 6 (six) hours as needed for pain. What changed:  Another medication with the same name was removed. Continue taking this medication, and follow the directions you see here.      Follow-up Information    [facility]. Schedule an appointment as soon as possible for a visit in 4 week(s).   Specialty:  Neurology Contact information: [address] [place] [phone]       Iona Beard, MD. Schedule an appointment as soon as possible for a visit in 1 week(s).   Specialty:  Family Medicine  Contact information: [address] [place] [address] [phone]          No Known Allergies  Consultations: neurology  Procedures/Studies: Ct Angio Head W Or Wo Contrast  Result Date: 03/16/2018 CLINICAL DATA:  Followup stroke in the right posterior frontal white matter. EXAM: CT ANGIOGRAPHY HEAD AND NECK TECHNIQUE: Multidetector CT imaging of the head and neck was performed using the standard protocol during bolus administration of intravenous contrast. Multiplanar CT image reconstructions and MIPs were obtained to evaluate the vascular anatomy. Carotid stenosis measurements (when applicable) are obtained utilizing NASCET criteria, using the distal internal carotid diameter as the denominator. CONTRAST:  80mL ISOVUE-370 IOPAMIDOL (ISOVUE-370) INJECTION 76% COMPARISON:   MRI yesterday. FINDINGS: CT HEAD FINDINGS Brain: Mild generalized atrophy. Moderate changes of chronic small vessel disease of the hemispheric white matter. Old pontine and thalamic strokes. Tiny low-density probably correlating with the acute stroke on MRI in the right corona radiata. No evidence of stroke extension or hemorrhage. No mass effect or shift. No hydrocephalus or extra-axial collection. Question benign dural calcifications versus calcified meningioma along the sphenoid wing on the left. No significant mass-effect upon the brain. Vascular: There is atherosclerotic calcification of the major vessels at the base of the brain. Skull: Negative Sinuses: Clear Orbits: Normal Review of the MIP images confirms the above findings CTA NECK FINDINGS Aortic arch: Atherosclerotic calcification without aneurysm or dissection. Branching pattern of the brachiocephalic vessels is normal without origin stenosis. Right carotid system: Common carotid artery is tortuous but widely patent to the bifurcation. There is calcified and soft plaque at the carotid bifurcation but the minimal diameter of the proximal ICA is 5 mm, this same as the more distal cervical ICA, therefore no stenosis. Left carotid system: Common carotid artery is mildly tortuous but widely patent to the bifurcation. Soft and calcified plaque at the bifurcation but no stenosis or irregularity. Cervical ICA tortuous but widely patent. Vertebral arteries: Left vertebral artery origin is patent. Left vertebral artery shows scattered foci of atherosclerotic plaque, with stenoses in the range of 30-40%, but the vessel is patent through the cervical region to the foramen magnum. 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 Skeleton: Minimal cervical spondylosis. Other neck: No mass or lymphadenopathy. Upper chest: Negative Review of the MIP images confirms the above findings CTA HEAD FINDINGS Anterior  circulation: Both internal carotid arteries are patent through the skull base and siphon region. There is ordinary peripheral atherosclerotic calcification in the carotid siphon regions, with stenoses likely in the 30-50% range on each side. The anterior and middle cerebral vessels are patent without correctable proximal stenosis, aneurysm or vascular malformation. There is atherosclerotic narrowing and irregularity in the more distal branch vessels, particularly in the right superior division M2 region. This would place the patient at some risk of a right MCA territory infarction. No large vessel occlusion. Posterior circulation: The left vertebral artery is widely patent through the foramen magnum to the basilar. The right vertebral artery reconstitutes by cervical collaterals at the foramen magnum. There is calcified plaque at the foramen magnum with stenosis of 50-70%. Right PICA is visualized. Distal to that, the vessel is occluded again. No basilar stenosis. Superior cerebellar arteries and posterior cerebral arteries are patent. There are stenoses in both PCA territories. Venous sinuses: Patent and normal Anatomic variants: None significant Delayed phase: No abnormal enhancement Review of the MIP images confirms the above findings IMPRESSION: No carotid bifurcation  disease of significance. 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 Left vertebral artery sufficiently patent to the basilar. Posterior circulation branch vessels are patent but do show medium vessel stenoses. Atherosclerotic disease in both carotid siphon regions with serial stenoses in the range of 30-50% on each side. Superior division M2 stenosis on the right, placing the patient at risk of right MCA infarction. One could consider neuro interventional consultation regarding this stenosis, given the recent  infarction. Electronically Signed   By: Nelson Chimes M.D.   On: 03/16/2018 16:03   Dg Chest 2 View  Result Date: 03/15/2018 CLINICAL DATA:  Chest pain EXAM: CHEST - 2 VIEW COMPARISON:  02/17/2011 FINDINGS: No pleural effusion. Moderate cardiomegaly increased compared to prior. No pleural effusion or focal consolidation. Mild vascular congestion. No pneumothorax. IMPRESSION: Moderate cardiomegaly with vascular congestion, increased compared to prior from 2012. Could consider component of pericardial effusion. Electronically Signed   By: Donavan Foil M.D.   On: 03/15/2018 19:01   Dg Lumbar Spine Complete  Result Date: 03/15/2018 CLINICAL DATA:  Fall EXAM: LUMBAR SPINE - COMPLETE 4+ VIEW COMPARISON:  CT 03/28/2018 FINDINGS: Lumbar alignment within normal limits. Mild degenerative changes at L3-L4 and L5-S1. Questionable mild superior endplate deformity at L1. Aortic atherosclerosis. IMPRESSION: 1. Possible mild superior endplate deformity at L1 2. Degenerative changes. Electronically Signed   By: Donavan Foil M.D.   On: 03/15/2018 19:03   Ct Angio Neck W Or Wo Contrast  Result Date: 03/16/2018 CLINICAL DATA:  Followup stroke in the right posterior frontal white matter. EXAM: CT ANGIOGRAPHY HEAD AND NECK TECHNIQUE: Multidetector CT imaging of the head and neck was performed using the standard protocol during bolus administration of intravenous contrast. Multiplanar CT image reconstructions and MIPs were obtained to evaluate the vascular anatomy. Carotid stenosis measurements (when applicable) are obtained utilizing NASCET criteria, using the distal internal carotid diameter as the denominator. CONTRAST:  75mL ISOVUE-370 IOPAMIDOL (ISOVUE-370) INJECTION 76% COMPARISON:  MRI yesterday. FINDINGS: CT HEAD FINDINGS Brain: Mild generalized atrophy. Moderate changes of chronic small vessel disease of the hemispheric white matter. Old pontine and thalamic strokes. Tiny low-density probably correlating with the  acute stroke on MRI in the right corona radiata. No evidence of stroke extension or hemorrhage. No mass effect or shift. No hydrocephalus or extra-axial collection. Question benign dural calcifications versus calcified meningioma along the sphenoid wing on the left. No significant mass-effect upon the brain. Vascular: There is atherosclerotic calcification of the major vessels at the base of the brain. Skull: Negative Sinuses: Clear Orbits: Normal Review of the MIP images confirms the above findings CTA NECK FINDINGS Aortic arch: Atherosclerotic calcification without aneurysm or dissection. Branching pattern of the brachiocephalic vessels is normal without origin stenosis. Right carotid system: Common carotid artery is tortuous but widely patent to the bifurcation. There is calcified and soft plaque at the carotid bifurcation but the minimal diameter of the proximal ICA is 5 mm, this same as the more distal cervical ICA, therefore no stenosis. Left carotid system: Common carotid artery is mildly tortuous but widely patent to the bifurcation. Soft and calcified plaque at the bifurcation but no stenosis or irregularity. Cervical ICA tortuous but widely patent. Vertebral arteries: Left vertebral artery origin is patent. Left vertebral artery shows scattered foci of atherosclerotic plaque, with stenoses in the range of 30-40%, but the vessel is patent through the cervical region to the  foramen magnum. 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 Skeleton: Minimal cervical spondylosis. Other neck: No mass or lymphadenopathy. Upper chest: Negative Review of the MIP images confirms the above findings CTA HEAD FINDINGS Anterior circulation: Both internal carotid arteries are patent through the skull base and siphon region. There is ordinary peripheral atherosclerotic calcification in the carotid siphon regions, with stenoses likely in the 30-50% range on each side.  The anterior and middle cerebral vessels are patent without correctable proximal stenosis, aneurysm or vascular malformation. There is atherosclerotic narrowing and irregularity in the more distal branch vessels, particularly in the right superior division M2 region. This would place the patient at some risk of a right MCA territory infarction. No large vessel occlusion. Posterior circulation: The left vertebral artery is widely patent through the foramen magnum to the basilar. The right vertebral artery reconstitutes by cervical collaterals at the foramen magnum. There is calcified plaque at the foramen magnum with stenosis of 50-70%. Right PICA is visualized. Distal to that, the vessel is occluded again. No basilar stenosis. Superior cerebellar arteries and posterior cerebral arteries are patent. There are stenoses in both PCA territories. Venous sinuses: Patent and normal Anatomic variants: None significant Delayed phase: No abnormal enhancement Review of the MIP images confirms the above findings IMPRESSION: No carotid bifurcation disease of significance. 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 Left vertebral artery sufficiently patent to the basilar. Posterior circulation branch vessels are patent but do show medium vessel stenoses. Atherosclerotic disease in both carotid siphon regions with serial stenoses in the range of 30-50% on each side. Superior division M2 stenosis on the right, placing the patient at risk of right MCA infarction. One could consider neuro interventional consultation regarding this stenosis, given the recent infarction. Electronically Signed   By: Nelson Chimes M.D.   On: 03/16/2018 16:03   Mr Brain Wo Contrast  Result Date: 03/15/2018 CLINICAL DATA:  67 y/o F; generalized weakness, lightheadedness, and multiple falls. EXAM: MRI HEAD WITHOUT CONTRAST  TECHNIQUE: Multiplanar, multiecho pulse sequences of the brain and surrounding structures were obtained without intravenous contrast. COMPARISON:  None. FINDINGS: Brain: 5 mm focus of reduced diffusion within the right mid corona radiata compatible with acute/early subacute infarction. No hemorrhage or mass effect. Very small chronic infarctions are present within the right cerebellar hemisphere and mid pons. Chronic lacunar infarctions are present within the left anterior limb of internal capsule and thalamus. Several patchynonspecific foci of T2 FLAIR hyperintense signal abnormality in subcortical and periventricular white matter are compatible withmoderatechronic microvascular ischemic changes for age. Moderatebrain parenchymal volume loss. Punctate foci of susceptibility hypointensity are present within the bilateral basal ganglia compatible with hemosiderin deposition of chronic microhemorrhage. Vascular: Normal flow voids. Skull and upper cervical spine: Normal marrow signal. Sinuses/Orbits: Small right maxillary sinus mucous retention cyst. Otherwise negative. 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 IMPRESSION: 1. 5 mm acute/early subacute infarction within the right mid corona radiata. No associated hemorrhage or mass effect. 2. Moderate for age chronic microvascular ischemic changes and parenchymal volume loss of the brain. 3. Small chronic infarctions in the right cerebellum, mid pons, and left basal ganglia. 4. Foci of chronic microhemorrhage in central distribution likely related to chronic hypertension. These results were called by telephone at the time of interpretation on 03/15/2018 at 9:56 pm to  Dr. Gareth Morgan , who verbally acknowledged these results. Electronically Signed   By: Kristine Garbe M.D.   On: 03/15/2018 21:58       Subjective: Patient seen and examined at bedside this morning.  Remains comfortable.  Stable for discharge to home  today.  Discharge Exam: Vitals:   03/18/18 0838 03/18/18 1340  BP: (!) 148/75 (!) 137/59  Pulse: 73 79  Resp: 18 18  Temp: 98.6 F (37 C)   SpO2: 98% 98%   Vitals:   03/17/18 2350 03/18/18 0403 03/18/18 0838 03/18/18 1340  BP: (!) 161/91 (!) 160/89 (!) 148/75 (!) 137/59  Pulse: 74 74 73 79  Resp: 18 18 18 18   Temp: 98.6 F (37 C) 98.7 F (37.1 C) 98.6 F (37 C)   TempSrc: Oral Oral Oral   SpO2: 99% 97% 98% 98%  Weight:      Height:        General: Pt is alert, awake, not in acute distress Cardiovascular: RRR, S1/S2 +, no rubs, no gallops Respiratory: CTA bilaterally, no wheezing, no rhonchi Abdominal: Soft, NT, ND, bowel sounds + Extremities: no edema, no cyanosis    The results of significant diagnostics from this hospitalization (including imaging, microbiology, ancillary and laboratory) are listed below for reference.     Microbiology: Recent Results (from the past 240 hour(s))  Urine culture     Status: Abnormal   Collection Time: 03/15/18  4:34 PM  Result Value Ref Range Status   Specimen Description URINE, RANDOM  Final   Special Requests   Final    NONE Performed at [facility], [address], [place], [place] [address]    Culture MULTIPLE SPECIES PRESENT, SUGGEST RECOLLECTION (A)  Final   Report Status 03/16/2018 FINAL  Final     Labs: BNP (last 3 results) Recent Labs    03/15/18 1806  BNP 716.9*   Basic Metabolic Panel: Recent Labs  Lab 03/15/18 1133 03/16/18 0934 03/17/18 0521 03/17/18 0930 03/18/18 0820  NA 139 140 140  --  140  K 3.6 3.7 3.7  --  4.1  CL 103 104 101  --  104  CO2 25 24 28   --  28  GLUCOSE 216* 231* 141*  --  154*  BUN 17 13 18   --  20  CREATININE 1.08* 1.16* 1.15*  --  1.29*  CALCIUM 8.6* 8.6* 8.4*  --  8.4*  MG  --   --   --  1.9  --    Liver Function Tests: Recent Labs  Lab 03/15/18 1806  AST 11*  ALT 12*  ALKPHOS 90  BILITOT 0.9  PROT 7.1  ALBUMIN 3.5   No results for input(s): LIPASE,  AMYLASE in the last 168 hours. No results for input(s): AMMONIA in the last 168 hours. CBC: Recent Labs  Lab 03/15/18 1133  WBC 6.7  HGB 12.9  HCT 39.7  MCV 94.1  PLT 286   Cardiac Enzymes: Recent Labs  Lab 03/15/18 1806 03/16/18 0335 03/16/18 0934  CKTOTAL 53  --   --   TROPONINI  --  <0.03 <0.03   BNP: Invalid input(s): POCBNP CBG: Recent Labs  Lab 03/17/18 1138 03/17/18 1701 03/17/18 2129 03/18/18 0606 03/18/18 1123  GLUCAP 168* 143* 130* 135* 186*   D-Dimer No results for input(s): DDIMER in the last 72 hours. Hgb A1c Recent Labs    03/16/18 0335  HGBA1C 7.7*   Lipid Profile Recent Labs    03/16/18 0335  CHOL 178  HDL 42  LDLCALC 112*  TRIG 119  CHOLHDL 4.2   Thyroid function studies No results for input(s): TSH, T4TOTAL, T3FREE, THYROIDAB in the last 72 hours.  Invalid input(s): FREET3 Anemia work up No results for input(s): VITAMINB12, FOLATE, FERRITIN, TIBC, IRON, RETICCTPCT in the last 72 hours. Urinalysis    Component Value Date/Time   COLORURINE YELLOW 03/15/2018 1720   APPEARANCEUR CLEAR 03/15/2018 1720   LABSPEC 1.015 03/15/2018 1720   PHURINE 7.0 03/15/2018 1720   GLUCOSEU NEGATIVE 03/15/2018 1720   HGBUR NEGATIVE 03/15/2018 1720   BILIRUBINUR NEGATIVE 03/15/2018 1720   KETONESUR NEGATIVE 03/15/2018 1720   PROTEINUR 100 (A) 03/15/2018 1720   UROBILINOGEN 4.0 (H) 07/29/2013 1231   NITRITE NEGATIVE 03/15/2018 1720   LEUKOCYTESUR NEGATIVE 03/15/2018 1720   Sepsis Labs Invalid input(s): PROCALCITONIN,  WBC,  LACTICIDVEN Microbiology Recent Results (from the past 240 hour(s))  Urine culture     Status: Abnormal   Collection Time: 03/15/18  4:34 PM  Result Value Ref Range Status   Specimen Description URINE, RANDOM  Final   Special Requests   Final    NONE Performed at [facility], [place] [address], [place], [place] [address]    Culture MULTIPLE SPECIES PRESENT, SUGGEST RECOLLECTION (A)  Final   Report Status 03/16/2018  FINAL  Final     Time coordinating discharge: 35 minutes  SIGNED:   Shelly Coss, MD  [facility] 03/18/2018, 1:50 PM Pager [phone]  If 7PM-7AM, please contact night-coverage [email or web address] Password [email or web address]

## 2018-03-18 NOTE — Progress Notes (Signed)
Patient ready for discharge to home; discharge instructions given and reviewed;Rx's given; patient discharged home with a ride from her friend. Equip sent with patient.

## 2018-03-18 NOTE — Progress Notes (Signed)
Physical Therapy Treatment Patient Details Name: Alicia [name] MRN: [id number] DOB: Aug 05, 1951 Today's Date: 03/18/2018    History of Present Illness 67 y.o. female presenting to the ED with generalized weakness and lightheadedness, in conjunction with 2 falls in the past few days. An MRI was obtained, revealing a subcentimeter acute right corona radiata ischemic infarction.Marland Kitchen PMH: arthritis;DM;depression; PAD; HTN; obesity    PT Comments    Pt progressing with mobility and likely will be able to DC home without needing CIR and this is pts preference. She has very limited help available at home though. She does not feel ready for DC today but we discussed walking several more times with nursing today and increasing her activity level. The pt is hopeful she will feel stronger tomorrow and be ready for DC home. She reports being mildly lightheaded  With gait but it does not get worse the further she ambulates.      Follow Up Recommendations  Home health PT;Supervision - Intermittent     Equipment Recommendations       Recommendations for Other Services       Precautions / Restrictions Precautions Precautions: Fall Restrictions Weight Bearing Restrictions: No    Mobility  Bed Mobility Overal bed mobility: (NT, pt up in a chair when PT arrived)                Transfers Overall transfer level: Needs assistance Equipment used: Rolling walker (2 wheeled) Transfers: Sit to/from Stand Sit to Stand: Supervision         General transfer comment: used upper extremities to power up  Ambulation/Gait Ambulation/Gait assistance: Min guard Ambulation Distance (Feet): 180 Feet Assistive device: Rolling walker (2 wheeled) Gait Pattern/deviations: Step-through pattern;Decreased stride length     General Gait Details: pt continues to use UE to support herself. She was fatigued after ambulating   Stairs Stairs: Yes Stairs assistance: Min assist Stair Management: Two  rails;Forwards(pt went down the steps sideways) Number of Stairs: 3     Wheelchair Mobility    Modified Rankin (Stroke Patients Only) Modified Rankin (Stroke Patients Only) Pre-Morbid Rankin Score: No symptoms Modified Rankin: Moderate disability     Balance                                            Cognition Arousal/Alertness: Awake/alert Behavior During Therapy: WFL for tasks assessed/performed Overall Cognitive Status: Within Functional Limits for tasks assessed                                        Exercises      General Comments General comments (skin integrity, edema, etc.): no family present. Pt reports she can have a neighbor her help her into her house and check on her. No local family      Pertinent Vitals/Pain Pain Assessment: No/denies pain    Home Living                      Prior Function            PT Goals (current goals can now be found in the care plan section) Acute Rehab PT Goals Patient Stated Goal: to go home Progress towards PT goals: Progressing toward goals    Frequency    Min 4X/week  PT Plan Discharge plan needs to be updated    Co-evaluation              AM-PAC PT "6 Clicks" Daily Activity  Outcome Measure  Difficulty turning over in bed (including adjusting bedclothes, sheets and blankets)?: A Little Difficulty moving from lying on back to sitting on the side of the bed? : A Little Difficulty sitting down on and standing up from a chair with arms (e.g., wheelchair, bedside commode, etc,.)?: A Little Help needed moving to and from a bed to chair (including a wheelchair)?: A Little Help needed walking in hospital room?: A Little Help needed climbing 3-5 steps with a railing? : A Little 6 Click Score: 18    End of Session Equipment Utilized During Treatment: Gait belt Activity Tolerance: Patient limited by fatigue Patient left: in chair;with call bell/phone within  reach   PT Visit Diagnosis: Unsteadiness on feet (R26.81)     Time: 4199-1444 PT Time Calculation (min) (ACUTE ONLY): 16 min  Charges:  $Gait Training: 8-22 mins                    G CodesLavonia [name], [place]  [phone] 03/18/2018    Alicia [name] 03/18/2018, 11:57 AM

## 2018-03-23 ENCOUNTER — Other Ambulatory Visit: Payer: Self-pay

## 2018-03-23 ENCOUNTER — Emergency Department (HOSPITAL_COMMUNITY): Payer: Medicare HMO

## 2018-03-23 ENCOUNTER — Emergency Department (HOSPITAL_COMMUNITY)
Admission: EM | Admit: 2018-03-23 | Discharge: 2018-03-24 | Disposition: A | Discharge status: Home / Self Care | Payer: Medicare HMO | Attending: Physician Assistant | Admitting: Physician Assistant

## 2018-03-23 ENCOUNTER — Encounter (HOSPITAL_COMMUNITY): Payer: Self-pay

## 2018-03-23 DIAGNOSIS — Z7982 Long term (current) use of aspirin: Secondary | ICD-10-CM | POA: Insufficient documentation

## 2018-03-23 DIAGNOSIS — I509 Heart failure, unspecified: Secondary | ICD-10-CM | POA: Diagnosis not present

## 2018-03-23 DIAGNOSIS — Z7902 Long term (current) use of antithrombotics/antiplatelets: Secondary | ICD-10-CM | POA: Diagnosis not present

## 2018-03-23 DIAGNOSIS — F17210 Nicotine dependence, cigarettes, uncomplicated: Secondary | ICD-10-CM | POA: Insufficient documentation

## 2018-03-23 DIAGNOSIS — R079 Chest pain, unspecified: Secondary | ICD-10-CM | POA: Diagnosis not present

## 2018-03-23 DIAGNOSIS — Z79899 Other long term (current) drug therapy: Secondary | ICD-10-CM | POA: Diagnosis not present

## 2018-03-23 DIAGNOSIS — Z8673 Personal history of transient ischemic attack (TIA), and cerebral infarction without residual deficits: Secondary | ICD-10-CM | POA: Diagnosis not present

## 2018-03-23 DIAGNOSIS — R42 Dizziness and giddiness: Secondary | ICD-10-CM | POA: Insufficient documentation

## 2018-03-23 DIAGNOSIS — I639 Cerebral infarction, unspecified: Secondary | ICD-10-CM | POA: Diagnosis not present

## 2018-03-23 DIAGNOSIS — Z7984 Long term (current) use of oral hypoglycemic drugs: Secondary | ICD-10-CM | POA: Diagnosis not present

## 2018-03-23 DIAGNOSIS — R29810 Facial weakness: Secondary | ICD-10-CM | POA: Diagnosis not present

## 2018-03-23 DIAGNOSIS — I110 Hypertensive heart disease with heart failure: Secondary | ICD-10-CM | POA: Insufficient documentation

## 2018-03-23 DIAGNOSIS — R531 Weakness: Secondary | ICD-10-CM | POA: Insufficient documentation

## 2018-03-23 DIAGNOSIS — I70219 Atherosclerosis of native arteries of extremities with intermittent claudication, unspecified extremity: Secondary | ICD-10-CM | POA: Insufficient documentation

## 2018-03-23 DIAGNOSIS — I6789 Other cerebrovascular disease: Secondary | ICD-10-CM | POA: Diagnosis not present

## 2018-03-23 DIAGNOSIS — R0602 Shortness of breath: Secondary | ICD-10-CM | POA: Diagnosis not present

## 2018-03-23 DIAGNOSIS — E119 Type 2 diabetes mellitus without complications: Secondary | ICD-10-CM | POA: Insufficient documentation

## 2018-03-23 HISTORY — DX: Cerebral infarction, unspecified: I63.9

## 2018-03-23 LAB — CBC
HEMATOCRIT: 38.8 % (ref 36.0–46.0)
Hemoglobin: 12.8 g/dL (ref 12.0–15.0)
MCH: 30.8 pg (ref 26.0–34.0)
MCHC: 33 g/dL (ref 30.0–36.0)
MCV: 93.5 fL (ref 78.0–100.0)
PLATELETS: 290 10*3/uL (ref 150–400)
RBC: 4.15 MIL/uL (ref 3.87–5.11)
RDW: 13.9 % (ref 11.5–15.5)
WBC: 6.8 10*3/uL (ref 4.0–10.5)

## 2018-03-23 LAB — APTT: APTT: 28 s (ref 24–36)

## 2018-03-23 LAB — COMPREHENSIVE METABOLIC PANEL
ALT: 12 U/L — AB (ref 14–54)
AST: 14 U/L — AB (ref 15–41)
Albumin: 3.3 g/dL — ABNORMAL LOW (ref 3.5–5.0)
Alkaline Phosphatase: 88 U/L (ref 38–126)
Anion gap: 11 (ref 5–15)
BUN: 17 mg/dL (ref 6–20)
CHLORIDE: 102 mmol/L (ref 101–111)
CO2: 25 mmol/L (ref 22–32)
Calcium: 8.9 mg/dL (ref 8.9–10.3)
Creatinine, Ser: 1.16 mg/dL — ABNORMAL HIGH (ref 0.44–1.00)
GFR, EST AFRICAN AMERICAN: 55 mL/min — AB (ref >60)
GFR, EST NON AFRICAN AMERICAN: 48 mL/min — AB (ref >60)
Glucose, Bld: 197 mg/dL — ABNORMAL HIGH (ref 65–99)
POTASSIUM: 4.1 mmol/L (ref 3.5–5.1)
SODIUM: 138 mmol/L (ref 135–145)
Total Bilirubin: 0.5 mg/dL (ref 0.3–1.2)
Total Protein: 6.6 g/dL (ref 6.5–8.1)

## 2018-03-23 LAB — ETHANOL: Alcohol, Ethyl (B): <10 mg/dL (ref <10)

## 2018-03-23 LAB — DIFFERENTIAL
BASOS ABS: 0 10*3/uL (ref 0.0–0.1)
BASOS PCT: 0 %
EOS ABS: 0.2 10*3/uL (ref 0.0–0.7)
Eosinophils Relative: 3 %
Lymphocytes Relative: 29 %
Lymphs Abs: 1.9 10*3/uL (ref 0.7–4.0)
MONOS PCT: 6 %
Monocytes Absolute: 0.4 10*3/uL (ref 0.1–1.0)
NEUTROS ABS: 4.2 10*3/uL (ref 1.7–7.7)
Neutrophils Relative %: 62 %

## 2018-03-23 LAB — URINALYSIS, ROUTINE W REFLEX MICROSCOPIC
Bilirubin Urine: NEGATIVE
GLUCOSE, UA: 50 mg/dL — AB
Hgb urine dipstick: NEGATIVE
Ketones, ur: NEGATIVE mg/dL
Leukocytes, UA: NEGATIVE
Nitrite: NEGATIVE
PH: 5 (ref 5.0–8.0)
Protein, ur: 30 mg/dL — AB
SPECIFIC GRAVITY, URINE: 1.015 (ref 1.005–1.030)

## 2018-03-23 LAB — RAPID URINE DRUG SCREEN, HOSP PERFORMED
AMPHETAMINES: NOT DETECTED
BARBITURATES: NOT DETECTED
Benzodiazepines: NOT DETECTED
COCAINE: NOT DETECTED
OPIATES: NOT DETECTED
TETRAHYDROCANNABINOL: NOT DETECTED

## 2018-03-23 LAB — PROTIME-INR
INR: 0.96
PROTHROMBIN TIME: 12.6 s (ref 11.4–15.2)

## 2018-03-23 MED ORDER — LORAZEPAM 2 MG/ML IJ SOLN
1.0000 mg | Freq: Once | INTRAMUSCULAR | Status: AC
  Administered 2018-03-23: 1 mg via INTRAVENOUS
  Filled 2018-03-23: qty 1

## 2018-03-23 NOTE — ED Notes (Signed)
ED Provider at bedside. 

## 2018-03-23 NOTE — Care Management Note (Signed)
ED CM received consult concerning patient who was discharged on Sunday 4/28 from [facility] with [facility] service PT/OT s/p CVA. Patient lives at home alone. She presents to [facility] ED today with c/o increased weakness.  ED CM and CSW  met with patient at bedside, she reports being sent home on Sunday with equipment  but has not received call from [facility] concerning home health visit. Discussed [place] services and how patient may benefit from [facility] for disease and  Medication management patient is agreeable with transitional care plan. CM spoke with EDP Dr. Thomasene Lot orders placed referral faxed via [facility] to [facility] liaison contacted for weekend start of care date.  Updated patient and reviewed plan patient verbalized understanding teach back done. No further ED CM needs identified.

## 2018-03-23 NOTE — Patient Outreach (Signed)
[facility]) Care Management  03/23/2018  LACEY WALLMAN 07/19/1951 [id number]   [employer] Referral received on 03/21/18: discharged from [facility] on 03/18/18. Client primary care complete transition of care calls.   Transition of care will be completed by primary care provider office who will refer to [facility] care management if needed.  Plan: RNCM will not open case.  Thea Silversmith, RN, MSN, [place] Coordinator Cell: [phone]

## 2018-03-23 NOTE — ED Notes (Signed)
Pt updated on wait for MRI

## 2018-03-23 NOTE — ED Provider Notes (Signed)
CVA 4 days ago, generalized weaknes Recurrent symptoms the same Leonel Ramsay saw her MR pending No new findings can be discharged home New findings warrant readmission  MRI performed and is negative for acute findings. Discussed results with patient. She can be discharged home per plan of previous treatment team. Patient is comfortable with discharge home.     Charlann Lange, PA-C 03/24/18 0127    Macarthur Critchley, MD 03/30/18 [phone]

## 2018-03-23 NOTE — ED Triage Notes (Signed)
Pt from home with ems for code stroke, LSN at 3pm today when she took a nap. Pt woke up and had increased weakness in her right arm and leg along with right sided facial droop. Per EMS pt was leaning towards her right side when sitting in a chair. Pt has hx of stroke last week and started on plavix. VSS.

## 2018-03-23 NOTE — ED Notes (Signed)
This RN attempted to ambulate the pt, pt states she was too dizzy/light headed to get up and that her legs were too weak to walk, pt stood on the side of the bed and pivoted to bedside commode with 1 assist. EDP made aware

## 2018-03-23 NOTE — ED Provider Notes (Signed)
[facility] [facility] EMERGENCY DEPARTMENT Provider Note   CSN: [id number] Arrival date & time: 03/23/18  1844   An emergency department physician performed an initial assessment on this suspected stroke patient at [facility].  History   Chief Complaint Chief Complaint  Patient presents with  . Code Stroke    HPI LORY NOWACZYK is a 67 y.o. female.  HPI   Patient is a 67 year old female with past medical history significant for diabetes hypertension hyperlipidemia obesity Paraschos peripheral artery disease and stroke.  Patient recently discharged after admission for stroke.  Patient reports that she woke up and felt "weak all over".  She reports that she thinks her both with her legs were weak.  She called EMS because she felt unable to get out of her chair to help lift the maintenance guy in to change the battery in her smoke detector.  She reports she felt lightheaded and weak when she tried to stand up.  She had this happen one time prior to her couple months ago.  It resolved spontaneously.  Patient is on Plavix and recently had a complete work-up for stroke.  No new neurological deficits noted by this MD on arrival.  Patient reports she feels little bit better now she is resting comfortably in a hospital bed.  Seen by Dr. Leonel Ramsay from neurology team.  Past Medical History:  Diagnosis Date  . Arthritis   . Chronic headaches   . Constipation   . Depression   . Diabetes mellitus   . Hyperlipidemia   . Hypertension   . Obesity   . PAD (peripheral artery disease) ([place])   . Shortness of breath   . Stroke [facility])     Patient Active Problem List   Diagnosis Date Noted  . CHF exacerbation ([place]) 03/16/2018  . Renal insufficiency 03/16/2018  . Hypertensive urgency 03/16/2018  . Diabetes mellitus type 2 in obese ([place]) 03/16/2018  . Morbid obesity ([place])   . CVA (cerebral vascular accident) ([place]) 03/15/2018  . Numbness in both hands- and Feet 06/06/2014  . Weakness  of both legs 06/06/2014  . Swelling of limb-Left Leg > Right 06/06/2014  . Peripheral vascular disease, unspecified ([place]) 11/30/2012  . Pain in limb 11/30/2012  . Atherosclerosis of native arteries of the extremities with intermittent claudication 04/06/2012  . CHEST PAIN 01/03/2011  . Hyperlipidemia 07/20/2007  . HYPERTENSION 07/20/2007  . ARTHRITIS 07/20/2007    Past Surgical History:  Procedure Laterality Date  . FEMORAL BYPASS  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 Bridgett Larsson     OB History   None      Home Medications    Prior to Admission medications   Medication Sig Start Date End Date Taking? Authorizing Provider  aspirin EC 325 MG EC tablet Take 1 tablet (325 mg total) by mouth daily. 03/19/18  Yes Shelly Coss, MD  atorvastatin (LIPITOR) 40 MG tablet Take 1 tablet (40 mg total) by mouth daily at 6 PM. 03/18/18  Yes Adhikari, Amrit, MD  clopidogrel (PLAVIX) 75 MG tablet Take 1 tablet (75 mg total) by mouth daily. 03/19/18  Yes Shelly Coss, MD  empagliflozin (JARDIANCE) 10 MG TABS tablet Take 10 mg by mouth daily as needed (high blood sugar (CBG >200)).    Yes [provider]  enalapril (VASOTEC) 20 MG tablet Take 20 mg by mouth 2 (two) times daily.  05/24/14  Yes [provider]  famotidine (PEPCID) 20 MG tablet Take  20 mg by mouth daily as needed for heartburn or indigestion.    Yes [provider]  hydrochlorothiazide (HYDRODIURIL) 25 MG tablet Take 25 mg by mouth daily.  01/26/18  Yes [provider]  labetalol (NORMODYNE) 200 MG tablet Take 1 tablet (200 mg total) by mouth 3 (three) times daily. 03/18/18  Yes Shelly Coss, MD  metFORMIN (GLUCOPHAGE) 1000 MG tablet Take 1 tablet (1,000 mg total) by mouth 2 (two) times daily with a meal. 03/18/18  Yes Adhikari, Tamsen Meek, MD  NIFEdipine (PROCARDIA-XL/ADALAT CC) 60 MG 24 hr tablet Take 1 tablet (60 mg total) by mouth daily. 03/18/18  Yes Shelly Coss, MD    diazepam (VALIUM) 5 MG tablet Take 1 tablet (5 mg total) by mouth 2 (two) times daily. Patient not taking: Reported on 03/24/2017 07/29/13   Carvel Getting, NP  oxyCODONE-acetaminophen (PERCOCET/ROXICET) 5-325 MG per tablet Take 1 tablet by mouth every 6 (six) hours as needed for pain. Patient not taking: Reported on 03/24/2017 07/29/13   Carvel Getting, NP    Family History Family History  Problem Relation Age of Onset  . Diabetes Father   . Heart disease Mother        NOT before age 54-  Bypass  . Hypertension Mother   . Hypertension Sister   . Varicose Veins Brother   . Heart disease Brother        Before age 72  . Hypertension Daughter     Social History Social History   Tobacco Use  . Smoking status: Light Tobacco Smoker    Packs/day: 0.50    Years: 35.00    Pack years: 17.50    Types: Cigarettes  . Smokeless tobacco: Never Used  . Tobacco comment: patient is trying to quit and has gone down to half a pack per day  Substance Use Topics  . Alcohol use: Yes    Alcohol/week: 0.5 oz    Types: 1 Standard drinks or equivalent per week    Comment: occasional use only  . Drug use: No     Allergies   Patient has no known allergies.   Review of Systems Review of Systems  Constitutional: Negative for activity change.  Respiratory: Negative for shortness of breath.   Cardiovascular: Negative for chest pain.  Gastrointestinal: Negative for abdominal pain.  Neurological: Positive for weakness and light-headedness.     Physical Exam Updated Vital Signs BP (!) 190/88   Pulse 67   Temp 98.9 F (37.2 C) (Oral)   Resp (!) 21   Ht 5\' 8"  (1.727 m)   Wt 118.8 kg (261 lb 14.5 oz)   SpO2 97%   BMI 39.82 kg/m   Physical Exam  Constitutional: She is oriented to person, place, and time. She appears well-developed and well-nourished.  HENT:  Head: Normocephalic and atraumatic.  Eyes: Right eye exhibits no discharge. Left eye exhibits no discharge.  Cardiovascular: Normal  rate and regular rhythm.  Pulmonary/Chest: Effort normal and breath sounds normal. No respiratory distress.  Abdominal: Soft. She exhibits no distension. There is no tenderness.  Neurological: She is oriented to person, place, and time.  Equal strength bilaterally upper and lower extremities negative pronator drift. Normal sensation bilaterally. Speech comprehensible, no slurring. Facial nerve tested and appears grossly normal. Alert and oriented 3.   Skin: Skin is warm and dry. She is not diaphoretic.  Psychiatric: She has a normal mood and affect.  Nursing note and vitals reviewed.    ED Treatments / Results  Labs (all labs ordered are listed, but only abnormal results are displayed) Labs Reviewed  COMPREHENSIVE METABOLIC PANEL - Abnormal; Notable for the following components:      Result Value   Glucose, Bld 197 (*)    Creatinine, Ser 1.16 (*)    Albumin 3.3 (*)    AST 14 (*)    ALT 12 (*)    GFR calc non Af Amer 48 (*)    GFR calc Af Amer 55 (*)    All other components within normal limits  URINALYSIS, ROUTINE W REFLEX MICROSCOPIC - Abnormal; Notable for the following components:   Glucose, UA 50 (*)    Protein, ur 30 (*)    Bacteria, UA RARE (*)    All other components within normal limits  ETHANOL  PROTIME-INR  APTT  CBC  DIFFERENTIAL  RAPID URINE DRUG SCREEN, HOSP PERFORMED  I-STAT CHEM 8, ED  I-STAT TROPONIN, ED    EKG None  Radiology Dg Chest 2 View  Result Date: 03/23/2018 CLINICAL DATA:  Shortness of breath and chest pain. EXAM: CHEST - 2 VIEW COMPARISON:  Chest x-ray dated March 15, 2018. FINDINGS: Stable moderate cardiomegaly. Normal pulmonary vascularity. No focal consolidation, pleural effusion, or pneumothorax. No acute osseous abnormality. IMPRESSION: Stable moderate cardiomegaly.  No active cardiopulmonary disease. Electronically Signed   By: Titus Dubin M.D.   On: 03/23/2018 20:06   Ct Head Code Stroke Wo Contrast  Result Date:  03/23/2018 CLINICAL DATA:  Code stroke. Acute onset of right-sided weakness right-sided facial droop. EXAM: CT HEAD WITHOUT CONTRAST TECHNIQUE: Contiguous axial images were obtained from the base of the skull through the vertex without intravenous contrast. COMPARISON:  CTA head and neck 03/16/2018. FINDINGS: Brain: A remote lacunar infarct is again noted in the left thalamus. Chronic white matter hypoattenuation is present within the left internal capsule anteriorly. Diffuse periventricular and subcortical white matter hypoattenuation is stable bilaterally. A remote medial inferior right cerebellar infarct is present. No acute infarct, hemorrhage, or mass lesion is present. The ventricles are proportionate to the degree of atrophy. Brainstem and cerebellum are otherwise within normal limits. Vascular: Atherosclerotic calcifications are present within the cavernous internal carotid arteries bilaterally. Additional calcifications are present along the dural margin of the right vertebral artery. There is no hyperdense vessel. Skull: Calvarium is intact. No focal lytic or blastic lesions are present. Sinuses/Orbits: Mild mucosal thickening is noted anteriorly in the right maxillary sinus. The paranasal sinuses and mastoid air cells are otherwise clear. 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 Globes and orbits are otherwise within normal limits. ([place] Stroke Program Early CT Score) - Ganglionic level infarction (caudate, lentiform nuclei, internal capsule, insula, M1-M3 cortex): 7/7 - Supraganglionic infarction (M4-M6 cortex): 3/3 Total score (0-10 with 10 being normal): 10/10 IMPRESSION: 1. No acute intracranial abnormality. 2. Stable remote lacunar infarct of the left thalamus. 3. Stable remote right cerebellar infarcts. 4. Stable chronic white matter disease. 5. ASPECTS is 10/10 The above was relayed via text pager to Dr. Roland Rack on 03/23/2018 at 18:52 . Electronically Signed   By: San Morelle  M.D.   On: 03/23/2018 18:53    Procedures Procedures (including critical care time)  Medications Ordered in ED Medications - No data to display   Initial Impression / Assessment and Plan / ED Course  I have reviewed the triage vital signs and the nursing notes.  Pertinent labs & imaging results that were available during my care of the patient were reviewed by me and considered in  my medical decision making (see chart for details).    Patient is a 67 year old female with past medical history significant for diabetes hypertension hyperlipidemia obesity Paraschos peripheral artery disease and stroke.  Patient recently discharged after admission for stroke.  Patient reports that she woke up and felt "weak all over".  She reports that she thinks her both with her legs were weak.  She called EMS because she felt unable to get out of her chair to help lift the maintenance guy in to change the battery in her smoke detector.  She reports she felt lightheaded and weak when she tried to stand up.  She had this happen one time prior to her couple months ago.  It resolved spontaneously.  Patient is on Plavix and recently had a complete work-up for stroke.  No new neurological deficits noted by this MD on arrival.  Patient reports she feels little bit better now she is resting comfortably in a hospital bed.  Seen by Dr. Leonel Ramsay from neurology team.  7:38 PM Patient appears to have no focal neurologic deficits.  Discussed with Dr. Leonel Ramsay who saw her on arrival when she was feeling globally weak all over who thought she might need a PT OT eval but did not think that she needed further stroke work-up.  He did recommend additional MRI.  Will initiate MRI here.  Patient appears to be moving fairly well and so I do not know that requires an inpatient admission at this time.   9:33 PM Will sign out pending new MRI.  If the MRI shows any new changes, will admit.  Otherwise will discharge home with PT  OT and follow-up.  Final Clinical Impressions(s) / ED Diagnoses   Final diagnoses:  None    ED Discharge Orders    None       Macarthur Critchley, MD 03/23/18 2133

## 2018-03-24 DIAGNOSIS — I639 Cerebral infarction, unspecified: Secondary | ICD-10-CM | POA: Diagnosis not present

## 2018-03-24 NOTE — Discharge Instructions (Addendum)
Your studies tonight have been normal. You can be discharged home and should follow up with your doctors in the outpatient setting as already scheduled.

## 2018-03-24 NOTE — ED Notes (Signed)
BP continues to be elevated.  Patient states I'll take my medication when I get home.

## 2018-03-26 ENCOUNTER — Telehealth: Payer: Self-pay | Admitting: Surgery

## 2018-03-26 LAB — I-STAT CHEM 8, ED
BUN: 23 mg/dL — AB (ref 6–20)
CREATININE: 1.1 mg/dL — AB (ref 0.44–1.00)
Calcium, Ion: 1.08 mmol/L — ABNORMAL LOW (ref 1.15–1.40)
Chloride: 101 mmol/L (ref 101–111)
Glucose, Bld: 194 mg/dL — ABNORMAL HIGH (ref 65–99)
HEMATOCRIT: 38 % (ref 36.0–46.0)
Hemoglobin: 12.9 g/dL (ref 12.0–15.0)
POTASSIUM: 4.4 mmol/L (ref 3.5–5.1)
Sodium: 138 mmol/L (ref 135–145)
TCO2: 28 mmol/L (ref 22–32)

## 2018-03-26 LAB — CBG MONITORING, ED: Glucose-Capillary: 203 mg/dL — ABNORMAL HIGH (ref 65–99)

## 2018-03-26 LAB — I-STAT TROPONIN, ED: TROPONIN I, POC: 0.02 ng/mL (ref 0.00–0.08)

## 2018-03-27 ENCOUNTER — Ambulatory Visit (HOSPITAL_COMMUNITY)
Admission: EM | Admit: 2018-03-27 | Discharge: 2018-03-27 | Disposition: A | Discharge status: Home / Self Care | Payer: Medicare HMO | Attending: Family Medicine | Admitting: Family Medicine

## 2018-03-27 ENCOUNTER — Other Ambulatory Visit: Payer: Self-pay

## 2018-03-27 ENCOUNTER — Encounter (HOSPITAL_COMMUNITY): Payer: Self-pay | Admitting: Emergency Medicine

## 2018-03-27 DIAGNOSIS — R531 Weakness: Secondary | ICD-10-CM | POA: Diagnosis not present

## 2018-03-27 DIAGNOSIS — R42 Dizziness and giddiness: Secondary | ICD-10-CM

## 2018-03-27 MED ORDER — NIFEDIPINE ER 90 MG PO TB24: 90.0000 mg | ORAL_TABLET | Freq: Every day | ORAL | 0 refills | Status: DC

## 2018-03-27 MED ORDER — BENZONATATE 200 MG PO CAPS: 200.0000 mg | ORAL_CAPSULE | Freq: Three times a day (TID) | ORAL | 0 refills | Status: DC

## 2018-03-27 MED ORDER — LABETALOL HCL 100 MG PO TABS: 200.0000 mg | ORAL_TABLET | Freq: Two times a day (BID) | ORAL | 0 refills | Status: DC

## 2018-03-27 NOTE — ED Provider Notes (Signed)
[name]    CSN: [id number] Arrival date & time: 03/27/18  1346     History   Chief Complaint Chief Complaint  Patient presents with  . Weakness  . Dizziness    HPI Alicia [name] is a 67 y.o. female history of hypertension, hyperlipidemia, diabetes mellitus type 2, peripheral artery disease, previous stroke presenting today for evaluation of weakness and dizziness.  Patient states that for the past 3 months she has had generalized weakness on both sides as well as associated dizziness.  She denies dizziness as room spinning, states she feels slightly lightheaded when she goes from sitting to standing.  Patient was recently admitted for a stroke at the end of April.  She was started on labetalol 3 times daily.  Patient was discharged and return to emergency room 4 days ago for reevaluation as she was having difficulty moving and weakness.  MRI showed no new acute changes, only previous infarcts in the brain.  She was reevaluated by neurology and deemed likely needs PT/OT.  Patient states that since she initiated the labetalol her symptoms have worsened.  Denies any new weakness or lightheadedness since she was evaluated in the emergency room.  Patient does have a PCP, but is wanting to get a new one.  Denies any changes in vision.  HPI  Past Medical History:  Diagnosis Date  . Arthritis   . Chronic headaches   . Constipation   . Depression   . Diabetes mellitus   . Hyperlipidemia   . Hypertension   . Obesity   . PAD (peripheral artery disease) ([place])   . Shortness of breath   . Stroke [facility])     Patient Active Problem List   Diagnosis Date Noted  . CHF exacerbation ([place]) 03/16/2018  . Renal insufficiency 03/16/2018  . Hypertensive urgency 03/16/2018  . Diabetes mellitus type 2 in obese ([place]) 03/16/2018  . Morbid obesity ([place])   . CVA (cerebral vascular accident) ([place]) 03/15/2018  . Numbness in both hands- and Feet 06/06/2014  . Weakness of both legs  06/06/2014  . Swelling of limb-Left Leg > Right 06/06/2014  . Peripheral vascular disease, unspecified ([place]) 11/30/2012  . Pain in limb 11/30/2012  . Atherosclerosis of native arteries of the extremities with intermittent claudication 04/06/2012  . CHEST PAIN 01/03/2011  . Hyperlipidemia 07/20/2007  . HYPERTENSION 07/20/2007  . ARTHRITIS 07/20/2007    Past Surgical History:  Procedure Laterality Date  . FEMORAL BYPASS  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 Bridgett Larsson    OB History   None      Home Medications    Prior to Admission medications   Medication Sig Start Date End Date Taking? Authorizing Provider  aspirin EC 325 MG EC tablet Take 1 tablet (325 mg total) by mouth daily. 03/19/18  Yes Shelly Coss, MD  atorvastatin (LIPITOR) 40 MG tablet Take 1 tablet (40 mg total) by mouth daily at 6 PM. 03/18/18  Yes Adhikari, Amrit, MD  clopidogrel (PLAVIX) 75 MG tablet Take 1 tablet (75 mg total) by mouth daily. 03/19/18  Yes Shelly Coss, MD  enalapril (VASOTEC) 20 MG tablet Take 20 mg by mouth 2 (two) times daily.  05/24/14  Yes [provider]  famotidine (PEPCID) 20 MG tablet Take 20 mg by mouth daily as needed for heartburn or indigestion.    Yes [provider]  hydrochlorothiazide (HYDRODIURIL) 25 MG tablet Take 25 mg by mouth daily.  01/26/18  Yes [provider]  metFORMIN (GLUCOPHAGE) 1000 MG tablet Take 1 tablet (1,000 mg total) by mouth 2 (two) times daily with a meal. 03/18/18  Yes Adhikari, Amrit, MD  benzonatate (TESSALON) 200 MG capsule Take 1 capsule (200 mg total) by mouth every 8 (eight) hours. 03/27/18   Johnice Riebe C, PA-C  diazepam (VALIUM) 5 MG tablet Take 1 tablet (5 mg total) by mouth 2 (two) times daily. Patient not taking: Reported on 03/24/2017 07/29/13   Carvel Getting, NP  empagliflozin (JARDIANCE) 10 MG TABS tablet Take 10 mg by mouth daily as needed (high blood sugar (CBG >200)).     [provider]  labetalol (NORMODYNE) 100 MG tablet Take 2 tablets (200 mg total) by mouth 2 (two) times daily. 03/27/18 04/26/18  Alijah Hyde C, PA-C  NIFEdipine (ADALAT CC) 90 MG 24 hr tablet Take 1 tablet (90 mg total) by mouth daily. 03/27/18 04/26/18  Amijah Timothy C, PA-C  oxyCODONE-acetaminophen (PERCOCET/ROXICET) 5-325 MG per tablet Take 1 tablet by mouth every 6 (six) hours as needed for pain. Patient not taking: Reported on 03/24/2017 07/29/13   Carvel Getting, NP    Family History Family History  Problem Relation Age of Onset  . Diabetes Father   . Heart disease Mother        NOT before age 60-  Bypass  . Hypertension Mother   . Hypertension Sister   . Varicose Veins Brother   . Heart disease Brother        Before age 67  . Hypertension Daughter     Social History Social History   Tobacco Use  . Smoking status: Light Tobacco Smoker    Packs/day: 0.50    Years: 35.00    Pack years: 17.50    Types: Cigarettes  . Smokeless tobacco: Never Used  . Tobacco comment: patient is trying to quit and has gone down to half a pack per day  Substance Use Topics  . Alcohol use: Yes    Alcohol/week: 0.5 oz    Types: 1 Standard drinks or equivalent per week    Comment: occasional use only  . Drug use: No     Allergies   Patient has no known allergies.   Review of Systems Review of Systems  Constitutional: Negative for activity change and appetite change.  HENT: Negative for trouble swallowing.   Eyes: Negative for pain and visual disturbance.  Respiratory: Negative for chest tightness and shortness of breath.   Cardiovascular: Negative for chest pain.  Gastrointestinal: Negative for abdominal pain, nausea and vomiting.  Musculoskeletal: Negative for arthralgias, back pain, gait problem, myalgias, neck pain and neck stiffness.  Skin: Negative for color change and wound.  Neurological: Positive for dizziness, weakness and light-headedness. Negative for seizures, syncope,  facial asymmetry, speech difficulty, numbness and headaches.     Physical Exam Triage Vital Signs ED Triage Vitals [03/27/18 1502]  Enc Vitals Group     BP (!) 197/79     Pulse Rate 69     Resp      Temp 98.2 F (36.8 C)     Temp Source Oral     SpO2 100 %     Weight      Height      Head Circumference      Peak Flow      Pain Score 0     Pain Loc      Pain Edu?      Excl.  in [place]?    No data found.  Updated Vital Signs BP (!) 197/79 (BP Location: Left Arm)   Pulse 69   Temp 98.2 F (36.8 C) (Oral)   SpO2 100%   Visual Acuity Right Eye Distance:   Left Eye Distance:   Bilateral Distance:    Right Eye Near:   Left Eye Near:    Bilateral Near:     Physical Exam  Constitutional: She is oriented to person, place, and time. She appears well-developed and well-nourished. No distress.  HENT:  Head: Normocephalic and atraumatic.  Eyes: Pupils are equal, round, and reactive to light. Conjunctivae and EOM are normal.  Neck: Neck supple.  Cardiovascular: Normal rate and regular rhythm.  No murmur heard. Pulmonary/Chest: Effort normal and breath sounds normal. No respiratory distress.  Abdominal: Soft. There is no tenderness.  Musculoskeletal: She exhibits no edema.  Neurological: She is alert and oriented to person, place, and time.  Cranial nerves II through XII grossly intact, no facial asymmetry, strength 5/5 at shoulders bilaterally, strength 5/5 at hips is abduction and hip extension, 4/5 with abduction and hip flexion.  Strength equal bilaterally.  Patellar reflexes 2+ bilaterally.  Normal finger-to-nose, rapid alternating movements.  Ambulating without abnormalities, ambulates slowly from exam table to chair, gets slightly lightheaded but resolves after pausing.  Skin: Skin is warm and dry.  Psychiatric: She has a normal mood and affect.  Nursing note and vitals reviewed.    UC Treatments / Results  Labs (all labs ordered are listed, but only abnormal  results are displayed) Labs Reviewed - No data to display  EKG None  Radiology No results found.  Procedures Procedures (including critical care time)  Medications Ordered in UC Medications - No data to display  Initial Impression / Assessment and Plan / UC Course  I have reviewed the triage vital signs and the nursing notes.  Pertinent labs & imaging results that were available during my care of the patient were reviewed by me and considered in my medical decision making (see chart for details).     EKG normal sinus rhythm, no acute changes, weakness likely residual from previous CVA.  Will refer to physical therapy.  Blood pressure elevated today, discussed changing medicines to nifedipine 90 and decreasing labetalol.  Patient states that she cannot take nifedipine 90-this causes nosebleeds.  Given patient is Already on 3 blood pressure medications will keep all medicines the same and have her follow-up with cardiology for further management.  We will continue to monitor blood pressure at home. Discussed strict return precautions. Patient verbalized understanding and is agreeable with plan.  Final Clinical Impressions(s) / UC Diagnoses   Final diagnoses:  Generalized weakness  Dizziness     Discharge Instructions     Please increase nifedipine to 90 mg daily; decrease labetolol to 100 mg twice daily  Follow up with Physical Therapy- they will contact you  Follow up with cardiology as you blood pressure is not controlled.   Your blood pressure was elevated today in clinic. Please be sure to take blood pressure medications as prescribed. Please monitor your blood pressure at home or when you go to a [facility]. Please follow up with your primary care doctor to recheck blood pressure and discuss any need for medication changes.   Please go to Emergency Room if you start to experience severe headache, vision changes, decreased urine production, chest pain, shortness of  breath, speech slurring, one sided weakness.    ED Prescriptions  Medication Sig Dispense Auth. Provider   NIFEdipine (ADALAT CC) 90 MG 24 hr tablet  (Status: Discontinued) Take 1 tablet (90 mg total) by mouth daily. 30 tablet Kaiel Weide C, PA-C   labetalol (NORMODYNE) 100 MG tablet  (Status: Discontinued) Take 2 tablets (200 mg total) by mouth 2 (two) times daily. 120 tablet Cyan Clippinger C, PA-C   labetalol (NORMODYNE) 100 MG tablet Take 2 tablets (200 mg total) by mouth 2 (two) times daily. 120 tablet Braeson Rupe C, PA-C   NIFEdipine (ADALAT CC) 90 MG 24 hr tablet Take 1 tablet (90 mg total) by mouth daily. 30 tablet Emilian Stawicki C, PA-C   benzonatate (TESSALON) 200 MG capsule Take 1 capsule (200 mg total) by mouth every 8 (eight) hours. 28 capsule Sasan Wilkie C, PA-C     Controlled Substance Prescriptions [place] Controlled Substance Registry consulted? Not Applicable   Janith Lima, [place] 03/27/18 1935

## 2018-03-27 NOTE — ED Triage Notes (Addendum)
Pt here today for same problems she has been having for several weeks.  She reports weakness, dizziness, a hot streak that goes through her left arm when she lifts it up and she also reports having a cold for several weeks.  Pt took a cab here today. Pt is SOB with ambulation.  Pt states she will not go back to her former PCP and does not know how to get a new one.  She states she has been put on new medication and wants to know if this could be causing some of her symptoms.

## 2018-03-27 NOTE — Discharge Instructions (Addendum)
Please increase nifedipine to 90 mg daily; decrease labetolol to 100 mg twice daily  Follow up with Physical Therapy- they will contact you  Follow up with cardiology as you blood pressure is not controlled.   Your blood pressure was elevated today in clinic. Please be sure to take blood pressure medications as prescribed. Please monitor your blood pressure at home or when you go to a [facility]. Please follow up with your primary care doctor to recheck blood pressure and discuss any need for medication changes.   Please go to Emergency Room if you start to experience severe headache, vision changes, decreased urine production, chest pain, shortness of breath, speech slurring, one sided weakness.

## 2018-03-30 DIAGNOSIS — I160 Hypertensive urgency: Secondary | ICD-10-CM | POA: Diagnosis not present

## 2018-03-30 DIAGNOSIS — E1122 Type 2 diabetes mellitus with diabetic chronic kidney disease: Secondary | ICD-10-CM | POA: Diagnosis not present

## 2018-03-30 DIAGNOSIS — I130 Hypertensive heart and chronic kidney disease with heart failure and stage 1 through stage 4 chronic kidney disease, or unspecified chronic kidney disease: Secondary | ICD-10-CM | POA: Diagnosis not present

## 2018-03-30 DIAGNOSIS — E1151 Type 2 diabetes mellitus with diabetic peripheral angiopathy without gangrene: Secondary | ICD-10-CM | POA: Diagnosis not present

## 2018-03-30 DIAGNOSIS — M1990 Unspecified osteoarthritis, unspecified site: Secondary | ICD-10-CM | POA: Diagnosis not present

## 2018-03-30 DIAGNOSIS — F329 Major depressive disorder, single episode, unspecified: Secondary | ICD-10-CM | POA: Diagnosis not present

## 2018-03-30 DIAGNOSIS — N182 Chronic kidney disease, stage 2 (mild): Secondary | ICD-10-CM | POA: Diagnosis not present

## 2018-03-30 DIAGNOSIS — I509 Heart failure, unspecified: Secondary | ICD-10-CM | POA: Diagnosis not present

## 2018-03-30 DIAGNOSIS — Z8673 Personal history of transient ischemic attack (TIA), and cerebral infarction without residual deficits: Secondary | ICD-10-CM | POA: Diagnosis not present

## 2018-03-31 DIAGNOSIS — I160 Hypertensive urgency: Secondary | ICD-10-CM | POA: Diagnosis not present

## 2018-03-31 DIAGNOSIS — E1151 Type 2 diabetes mellitus with diabetic peripheral angiopathy without gangrene: Secondary | ICD-10-CM | POA: Diagnosis not present

## 2018-03-31 DIAGNOSIS — N182 Chronic kidney disease, stage 2 (mild): Secondary | ICD-10-CM | POA: Diagnosis not present

## 2018-03-31 DIAGNOSIS — Z8673 Personal history of transient ischemic attack (TIA), and cerebral infarction without residual deficits: Secondary | ICD-10-CM | POA: Diagnosis not present

## 2018-03-31 DIAGNOSIS — E1122 Type 2 diabetes mellitus with diabetic chronic kidney disease: Secondary | ICD-10-CM | POA: Diagnosis not present

## 2018-03-31 DIAGNOSIS — I509 Heart failure, unspecified: Secondary | ICD-10-CM | POA: Diagnosis not present

## 2018-03-31 DIAGNOSIS — F329 Major depressive disorder, single episode, unspecified: Secondary | ICD-10-CM | POA: Diagnosis not present

## 2018-03-31 DIAGNOSIS — I130 Hypertensive heart and chronic kidney disease with heart failure and stage 1 through stage 4 chronic kidney disease, or unspecified chronic kidney disease: Secondary | ICD-10-CM | POA: Diagnosis not present

## 2018-03-31 DIAGNOSIS — M1990 Unspecified osteoarthritis, unspecified site: Secondary | ICD-10-CM | POA: Diagnosis not present

## 2018-04-03 ENCOUNTER — Other Ambulatory Visit: Payer: Self-pay

## 2018-04-03 ENCOUNTER — Observation Stay (HOSPITAL_COMMUNITY)
Admission: EM | Admit: 2018-04-03 | Discharge: 2018-04-04 | Disposition: A | Discharge status: Home / Self Care | Payer: Medicare HMO | Attending: Family Medicine | Admitting: Family Medicine

## 2018-04-03 DIAGNOSIS — Z79899 Other long term (current) drug therapy: Secondary | ICD-10-CM | POA: Insufficient documentation

## 2018-04-03 DIAGNOSIS — E785 Hyperlipidemia, unspecified: Secondary | ICD-10-CM | POA: Diagnosis not present

## 2018-04-03 DIAGNOSIS — E1169 Type 2 diabetes mellitus with other specified complication: Secondary | ICD-10-CM | POA: Diagnosis not present

## 2018-04-03 DIAGNOSIS — Z888 Allergy status to other drugs, medicaments and biological substances status: Secondary | ICD-10-CM | POA: Diagnosis not present

## 2018-04-03 DIAGNOSIS — F17210 Nicotine dependence, cigarettes, uncomplicated: Secondary | ICD-10-CM | POA: Insufficient documentation

## 2018-04-03 DIAGNOSIS — E119 Type 2 diabetes mellitus without complications: Secondary | ICD-10-CM | POA: Diagnosis not present

## 2018-04-03 DIAGNOSIS — I160 Hypertensive urgency: Secondary | ICD-10-CM

## 2018-04-03 DIAGNOSIS — Z7901 Long term (current) use of anticoagulants: Secondary | ICD-10-CM | POA: Insufficient documentation

## 2018-04-03 DIAGNOSIS — Z7982 Long term (current) use of aspirin: Secondary | ICD-10-CM | POA: Insufficient documentation

## 2018-04-03 DIAGNOSIS — Z7984 Long term (current) use of oral hypoglycemic drugs: Secondary | ICD-10-CM | POA: Insufficient documentation

## 2018-04-03 DIAGNOSIS — M1990 Unspecified osteoarthritis, unspecified site: Secondary | ICD-10-CM | POA: Insufficient documentation

## 2018-04-03 DIAGNOSIS — F17200 Nicotine dependence, unspecified, uncomplicated: Secondary | ICD-10-CM | POA: Insufficient documentation

## 2018-04-03 DIAGNOSIS — Z833 Family history of diabetes mellitus: Secondary | ICD-10-CM | POA: Insufficient documentation

## 2018-04-03 DIAGNOSIS — R55 Syncope and collapse: Secondary | ICD-10-CM | POA: Diagnosis present

## 2018-04-03 DIAGNOSIS — Z8673 Personal history of transient ischemic attack (TIA), and cerebral infarction without residual deficits: Secondary | ICD-10-CM | POA: Diagnosis not present

## 2018-04-03 DIAGNOSIS — E669 Obesity, unspecified: Secondary | ICD-10-CM

## 2018-04-03 DIAGNOSIS — I119 Hypertensive heart disease without heart failure: Secondary | ICD-10-CM | POA: Diagnosis not present

## 2018-04-03 DIAGNOSIS — I739 Peripheral vascular disease, unspecified: Secondary | ICD-10-CM | POA: Diagnosis not present

## 2018-04-03 DIAGNOSIS — Z91040 Latex allergy status: Secondary | ICD-10-CM | POA: Insufficient documentation

## 2018-04-03 DIAGNOSIS — I951 Orthostatic hypotension: Secondary | ICD-10-CM | POA: Diagnosis not present

## 2018-04-03 DIAGNOSIS — R531 Weakness: Secondary | ICD-10-CM | POA: Diagnosis not present

## 2018-04-03 DIAGNOSIS — Z9889 Other specified postprocedural states: Secondary | ICD-10-CM | POA: Insufficient documentation

## 2018-04-03 DIAGNOSIS — R42 Dizziness and giddiness: Secondary | ICD-10-CM | POA: Diagnosis not present

## 2018-04-03 DIAGNOSIS — R404 Transient alteration of awareness: Secondary | ICD-10-CM | POA: Diagnosis not present

## 2018-04-03 DIAGNOSIS — Z8249 Family history of ischemic heart disease and other diseases of the circulatory system: Secondary | ICD-10-CM | POA: Insufficient documentation

## 2018-04-03 DIAGNOSIS — Z6838 Body mass index (BMI) 38.0-38.9, adult: Secondary | ICD-10-CM | POA: Insufficient documentation

## 2018-04-03 DIAGNOSIS — F329 Major depressive disorder, single episode, unspecified: Secondary | ICD-10-CM | POA: Insufficient documentation

## 2018-04-03 DIAGNOSIS — D649 Anemia, unspecified: Secondary | ICD-10-CM | POA: Diagnosis not present

## 2018-04-03 LAB — BASIC METABOLIC PANEL
Anion gap: 11 (ref 5–15)
BUN: 18 mg/dL (ref 6–20)
CALCIUM: 8.6 mg/dL — AB (ref 8.9–10.3)
CO2: 27 mmol/L (ref 22–32)
CREATININE: 1.06 mg/dL — AB (ref 0.44–1.00)
Chloride: 102 mmol/L (ref 101–111)
GFR, EST NON AFRICAN AMERICAN: 53 mL/min — AB (ref >60)
Glucose, Bld: 182 mg/dL — ABNORMAL HIGH (ref 65–99)
Potassium: 3.7 mmol/L (ref 3.5–5.1)
Sodium: 140 mmol/L (ref 135–145)

## 2018-04-03 LAB — CBC WITH DIFFERENTIAL/PLATELET
BASOS ABS: 0.2 10*3/uL — AB (ref 0.0–0.1)
BASOS PCT: 2 %
Eosinophils Absolute: 0.2 10*3/uL (ref 0.0–0.7)
Eosinophils Relative: 2 %
HCT: 36.6 % (ref 36.0–46.0)
Hemoglobin: 11.9 g/dL — ABNORMAL LOW (ref 12.0–15.0)
Lymphocytes Relative: 15 %
Lymphs Abs: 1.3 10*3/uL (ref 0.7–4.0)
MCH: 29.8 pg (ref 26.0–34.0)
MCHC: 32.5 g/dL (ref 30.0–36.0)
MCV: 91.7 fL (ref 78.0–100.0)
MONO ABS: 0.5 10*3/uL (ref 0.1–1.0)
Monocytes Relative: 6 %
NEUTROS ABS: 6.2 10*3/uL (ref 1.7–7.7)
NEUTROS PCT: 75 %
PLATELETS: 274 10*3/uL (ref 150–400)
RBC: 3.99 MIL/uL (ref 3.87–5.11)
RDW: 13.2 % (ref 11.5–15.5)
SMEAR REVIEW: ADEQUATE
WBC: 8.4 10*3/uL (ref 4.0–10.5)

## 2018-04-03 LAB — I-STAT TROPONIN, ED: TROPONIN I, POC: 0.01 ng/mL (ref 0.00–0.08)

## 2018-04-03 LAB — POC OCCULT BLOOD, ED: Fecal Occult Bld: NEGATIVE

## 2018-04-03 MED ORDER — ACETAMINOPHEN 650 MG RE SUPP: 650.0000 mg | Freq: Four times a day (QID) | RECTAL | Status: DC | PRN

## 2018-04-03 MED ORDER — ATORVASTATIN CALCIUM 40 MG PO TABS: 40.0000 mg | ORAL_TABLET | Freq: Every day | ORAL | Status: DC

## 2018-04-03 MED ORDER — ASPIRIN EC 325 MG PO TBEC
325.0000 mg | DELAYED_RELEASE_TABLET | Freq: Every day | ORAL | Status: DC
  Administered 2018-04-04: 325 mg via ORAL
  Filled 2018-04-03: qty 1

## 2018-04-03 MED ORDER — INSULIN ASPART 100 UNIT/ML ~~LOC~~ SOLN: 0.0000 [IU] | Freq: Every day | SUBCUTANEOUS | Status: DC

## 2018-04-03 MED ORDER — ENOXAPARIN SODIUM 40 MG/0.4ML ~~LOC~~ SOLN
40.0000 mg | SUBCUTANEOUS | Status: DC
  Administered 2018-04-04: 40 mg via SUBCUTANEOUS
  Filled 2018-04-03: qty 0.4

## 2018-04-03 MED ORDER — ENALAPRIL MALEATE 20 MG PO TABS
20.0000 mg | ORAL_TABLET | Freq: Once | ORAL | Status: AC
  Administered 2018-04-03: 20 mg via ORAL
  Filled 2018-04-03: qty 1

## 2018-04-03 MED ORDER — HYDROCHLOROTHIAZIDE 25 MG PO TABS
25.0000 mg | ORAL_TABLET | Freq: Every day | ORAL | Status: DC
  Administered 2018-04-04: 25 mg via ORAL
  Filled 2018-04-03: qty 1

## 2018-04-03 MED ORDER — NIFEDIPINE ER 60 MG PO TB24
60.0000 mg | ORAL_TABLET | Freq: Every day | ORAL | Status: DC
  Administered 2018-04-04: 60 mg via ORAL
  Filled 2018-04-03: qty 1

## 2018-04-03 MED ORDER — CLOPIDOGREL BISULFATE 75 MG PO TABS
75.0000 mg | ORAL_TABLET | Freq: Every day | ORAL | Status: DC
  Administered 2018-04-04: 75 mg via ORAL
  Filled 2018-04-03: qty 1

## 2018-04-03 MED ORDER — SODIUM CHLORIDE 0.9 % IV SOLN
INTRAVENOUS | Status: AC
  Administered 2018-04-03: 23:00:00 via INTRAVENOUS

## 2018-04-03 MED ORDER — ENALAPRIL MALEATE 20 MG PO TABS
20.0000 mg | ORAL_TABLET | Freq: Two times a day (BID) | ORAL | Status: DC
  Administered 2018-04-04: 20 mg via ORAL
  Filled 2018-04-03: qty 1

## 2018-04-03 MED ORDER — LABETALOL HCL 200 MG PO TABS
200.0000 mg | ORAL_TABLET | Freq: Once | ORAL | Status: AC
Start: 2018-04-03 — End: 2018-04-03
  Administered 2018-04-03: 200 mg via ORAL
  Filled 2018-04-03: qty 1

## 2018-04-03 MED ORDER — LABETALOL HCL 100 MG PO TABS
100.0000 mg | ORAL_TABLET | Freq: Two times a day (BID) | ORAL | Status: DC
  Administered 2018-04-04: 100 mg via ORAL
  Filled 2018-04-03: qty 1

## 2018-04-03 MED ORDER — ACETAMINOPHEN 325 MG PO TABS: 650.0000 mg | ORAL_TABLET | Freq: Four times a day (QID) | ORAL | Status: DC | PRN

## 2018-04-03 MED ORDER — INSULIN ASPART 100 UNIT/ML ~~LOC~~ SOLN
0.0000 [IU] | Freq: Three times a day (TID) | SUBCUTANEOUS | Status: DC
  Administered 2018-04-04 (×2): 3 [IU] via SUBCUTANEOUS

## 2018-04-03 MED ORDER — SODIUM CHLORIDE 0.9 % IV BOLUS
1000.0000 mL | Freq: Once | INTRAVENOUS | Status: AC
  Administered 2018-04-03: 1000 mL via INTRAVENOUS

## 2018-04-03 MED ORDER — BENZONATATE 100 MG PO CAPS
200.0000 mg | ORAL_CAPSULE | Freq: Three times a day (TID) | ORAL | Status: DC
  Administered 2018-04-03 – 2018-04-04 (×3): 200 mg via ORAL
  Filled 2018-04-03 (×3): qty 2

## 2018-04-03 MED ORDER — ONDANSETRON HCL 4 MG/2ML IJ SOLN: 4.0000 mg | Freq: Four times a day (QID) | INTRAMUSCULAR | Status: DC | PRN

## 2018-04-03 NOTE — H&P (Signed)
History and Physical  Patient Name: Alicia [name]     DTO:[id number]    DOB: 1950/12/31    DOA: 04/03/2018 PCP: Lucianne Lei, MD  Patient coming from: Home  Chief Complaint: Syncope, dizziness      HPI: Alicia [name] is a 67 y.o. female with a past medical history significant for dCHF, recent CVA, PVD hx left fem-pop bypass, NIDDM, active smoker, and poorly controlled HTN who presents with dizziness and syncope x3.  Recently presented to the ER 4/25 with fatigue, dizziness, found to have subcentimeter corona-radiata stroke, severe range hypertension.  Was started on Plavix, BP meds titrated, was discharged to home with HHPT.  Since being home by herself, the patient has returned to the ER twice for similar symptoms, generalized fatigue, bilateral leg weakness, lightheadedness and dizziness.  On 5/3, the first time, she had a MRI brain, was normal, was discharged.  Second time, she was given fluids and antihypertensives, felt better, was discharged.    Today, she was at her recent baseline (weak, tired, but functional), got up, made coffee and watched TV.  She skipped breakfast, and by late afternoon, she went to the kitchen to make herself a sandwich, but when she sat back down, she felt malaise, hot, "very lightheaded" and "dizzier than I've ever been in my life", "like I was going to die."  She had to lie down for a while, and eventually, tried to get up, at which time she became nauseated, threw up, and then passed out.  Every time she would try to get up, her symptoms were worse, and she would be dizzy and passed out twice more.  EMS were called.  ED course: -Afebrile, heart rate 68, respirations 24, blood pressure 171/74, pulse ox 96% on room air -Na 140, K 3.7, Cr 1.06 (baseline 1.0), WBC 8.4 K, Hgb 11.9 (which is her baseline) -Troponin negative -ECG showed normal sinus rhythm, possibly minimally prolonged QTC -On orthostatics, her blood pressure went from 200/100 lying to  160/80 standing and she was very symtpomatic -She was given her evening doses of medicine, her blood pressure improved -She was given IV fluids but was still very dizzy with standing, and so TRH were asked to observe oernight for orthostatic hypotension     ROS: Review of Systems  Constitutional: Positive for malaise/fatigue. Negative for diaphoresis and fever.  HENT: Negative for congestion, ear pain, hearing loss, sinus pain, sore throat and tinnitus.   Respiratory: Positive for cough. Negative for sputum production, shortness of breath, wheezing and stridor.   Cardiovascular: Negative for chest pain, palpitations, orthopnea, leg swelling and PND.  Gastrointestinal: Positive for nausea and vomiting. Negative for abdominal pain, diarrhea and heartburn.  Genitourinary: Negative for dysuria, frequency and urgency.  Neurological: Positive for dizziness, loss of consciousness and weakness. Negative for tingling, tremors, sensory change, speech change, focal weakness, seizures and headaches.  Psychiatric/Behavioral: Negative for depression and memory loss. The patient is nervous/anxious.   All other systems reviewed and are negative.         Past Medical History:  Diagnosis Date  . Arthritis   . Chronic headaches   . Constipation   . Depression   . Diabetes mellitus   . Hyperlipidemia   . Hypertension   . Obesity   . PAD (peripheral artery disease) ([place])   . Shortness of breath   . Stroke [facility])     Past Surgical History:  Procedure Laterality Date  . FEMORAL BYPASS  02/23/2011   Left  Common Femoral to Below-knee popliteasl BPG   by Dr. Bridgett Larsson    Social History: Patient lives alone.  The patient walks unassisted.  Active smoker.  Allergies  Allergen Reactions  . Nifedipine Er Other (See Comments)    Caused nose bleeds in higher doses (90 mg., namely)  . Latex Rash    Family history: family history includes Diabetes in her father; Heart disease in her brother and mother;  Hypertension in her daughter, mother, and sister; Varicose Veins in her brother.  Prior to Admission medications   Medication Sig Start Date End Date Taking? Authorizing Provider  acetaminophen (TYLENOL) 500 MG tablet Take 500-1,000 mg by mouth every 6 (six) hours as needed (for pain).   Yes [provider]  aspirin EC 325 MG EC tablet Take 1 tablet (325 mg total) by mouth daily. 03/19/18  Yes Shelly Coss, MD  atorvastatin (LIPITOR) 40 MG tablet Take 1 tablet (40 mg total) by mouth daily at 6 PM. 03/18/18  Yes Adhikari, Amrit, MD  benzonatate (TESSALON) 200 MG capsule Take 1 capsule (200 mg total) by mouth every 8 (eight) hours. 03/27/18  Yes Wieters, Hallie C, PA-C  clopidogrel (PLAVIX) 75 MG tablet Take 1 tablet (75 mg total) by mouth daily. 03/19/18  Yes Shelly Coss, MD  enalapril (VASOTEC) 20 MG tablet Take 20 mg by mouth 2 (two) times daily.  05/24/14  Yes [provider]  hydrochlorothiazide (HYDRODIURIL) 25 MG tablet Take 25 mg by mouth daily.  01/26/18  Yes [provider]  metFORMIN (GLUCOPHAGE) 1000 MG tablet Take 1 tablet (1,000 mg total) by mouth 2 (two) times daily with a meal. 03/18/18  Yes Adhikari, Tamsen Meek, MD  NIFEdipine (PROCARDIA-XL/ADALAT CC) 60 MG 24 hr tablet Take 60 mg by mouth daily.   Yes [provider]  labetalol (NORMODYNE) 100 MG tablet Take 2 tablets (200 mg total) by mouth 2 (two) times daily. 03/27/18 04/26/18  Wieters, Elesa Hacker, PA-C       Physical Exam: BP (!) 177/57   Pulse 72   Temp 98.8 F (37.1 C)   Resp (!) 25   Ht 5\' 8"  (1.727 m)   Wt 116.1 kg (256 lb)   SpO2 95%   BMI 38.92 kg/m  General appearance: Well-developed, adult female, alert and in moderate distress from malaise, anxiety.   Eyes: Anicteric, conjunctiva pink, lids and lashes normal. PERRL.    ENT: No nasal deformity, discharge, epistaxis.  Hearing normal. OP dry, no oral lesions. Teeth dentition poor, lips dry. Neck: No neck masses.  Trachea midline.  No  thyromegaly/tenderness. Lymph: No cervical or supraclavicular lymphadenopathy or axillary lymphadenopathy. Skin: Warm and dry.  No jaundice.  No suspicious rashes or lesions. Cardiac: RRR, nl S1-S2, no murmurs appreciated.  Capillary refill is brisk.  JVP not visible.  No LE edema.  Radial and DP pulses 2+ and symmetric. Respiratory: Normal respiratory rate and rhythm.  CTAB without rales or wheezes. Abdomen: Abdomen soft.  No TTP. No ascites, distension, hepatosplenomegaly.   MSK: No deformities or effusions.  No cyanosis or clubbing. Neuro: Cranial nerves normal 3-12.  Sensation intact to light touch. Speech is fluent.  Muscle strength 5/5 in upper and lower extremities bilaterally.  No nystagmus.    Psych: Sensorium intact and responding to questions, attention normal.  Behavior appropriate.  Affect anxious.  Judgment and insight appear normal.     Labs on Admission:  I have personally reviewed following labs and imaging studies: CBC: Recent Labs  Lab 04/03/18  1617  WBC 8.4  NEUTROABS 6.2  HGB 11.9*  HCT 36.6  MCV 91.7  PLT 314   Basic Metabolic Panel: Recent Labs  Lab 04/03/18 1617  NA 140  K 3.7  CL 102  CO2 27  GLUCOSE 182*  BUN 18  CREATININE 1.06*  CALCIUM 8.6*   GFR: Estimated Creatinine Clearance: 68.9 mL/min (A) (by C-G formula based on SCr of 1.06 mg/dL (H)).   Radiological Exams on Admission: Personally reviewed MRI report from 5/3 which showed resolving subcentimeter infarct.  EKG: Independently reviewed. Shows normal sinus rhythm, QTc 505.    Assessment/Plan  1. Orthostatic hypotension Syncope:  Orthostatic by BP and by symptoms.  Syncope also correlated to standing.  Recent Echo normal.  No arrhythmias during last hosptialization.   -IV fluids   2. Hypertensive urgency:  -Restart home nifedipine, enalapril, HCTZ -Reduce dose of labetalol as her generalized fatigue of the last few months correlates with this medicine  3. Peripheral vascular  disease  Recent CVA: Brief symptoms (dizziness, fatigue) are similar to presenting symptoms from recent stroke, however, they are resolved now, her BP is resolved, and she has had recent normal echo, normal carotid imaging, and even repeat MRI since discharge that was stable. -Defer MRI now -Continue aspirin, Plavix  4. Morbid obesity:  Counseled on weight loss  5. Smoking:  Counseling given on cessation  6. Diabetes:  -Hold metformin -SSI with meals  7. Prolonged QTc:  Avoid QT prolonging medicines     DVT prophylaxis: Lovenox  Code Status: FULL  Family Communication: None present  Disposition Plan: Anticipate IV fluids overnight, monitor telemetry, home tomorrow. Consults called: None Admission status: OBS At the point of initial evaluation, it is my clinical opinion that admission for OBSERVATION is reasonable and necessary because the patient's presenting complaints in the context of their chronic conditions represent sufficient risk of deterioration or significant morbidity to constitute reasonable grounds for close observation in the hospital setting, but that the patient may be medically stable for discharge from the hospital within 24 to 48 hours.    Medical decision making: Patient seen at 6:30 PM on 04/03/2018.  The patient was discussed with Dr. Winfred Leeds.  What exists of the patient's chart was reviewed in depth and summarized above.  Clinical condition: stable.        Edwin Dada [facility] Pager [phone]

## 2018-04-03 NOTE — ED Provider Notes (Addendum)
Alicia Alicia [name] Note   CSN: [id number] Arrival date & time: 04/03/18  1555     History   Chief Complaint Chief Complaint  Alicia [name] presents with  . Loss of Consciousness    HPI Alicia Alicia [name] is a 67 y.o. female.  HPI Alicia [name] complains of generalized weakness onset 2 or 3 days ago.  Today she had 3 syncopal events between 2:30 PM Alicia 3 PM.  She denies headache chest pain abdominal pain shortness of breath no fever.  No pain anywhere she is presently asymptomatic except feels "tired" last bowel movement yesterday normal.  No treatment prior to coming here.  Brought by EMS nothing makes symptoms better or worse.  No other associated symptoms Alicia she had 2 episodes of vomiting today.  No nausea at present.  No hematemesis  Past Medical History:  Diagnosis Date  . Arthritis   . Chronic headaches   . Constipation   . Depression   . Diabetes mellitus   . Hyperlipidemia   . Hypertension   . Obesity   . PAD (peripheral artery disease) ([place])   . Shortness of breath   . Stroke [facility])     Alicia [name] Active Problem List   Diagnosis Date Noted  . CHF exacerbation ([place]) 03/16/2018  . Renal insufficiency 03/16/2018  . Hypertensive urgency 03/16/2018  . Diabetes mellitus type 2 in obese ([place]) 03/16/2018  . Morbid obesity ([place])   . CVA (cerebral vascular accident) ([place]) 03/15/2018  . Numbness in both hands- Alicia Feet 06/06/2014  . Weakness of both legs 06/06/2014  . Swelling of limb-Left Leg > Right 06/06/2014  . Peripheral vascular disease, unspecified ([place]) 11/30/2012  . Pain in limb 11/30/2012  . 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 CHEST PAIN 01/03/2011  . Hyperlipidemia 07/20/2007  . HYPERTENSION 07/20/2007  . ARTHRITIS 07/20/2007    Past Surgical History:  Procedure Laterality Date  . FEMORAL BYPASS  02/23/2011   Left Common Femoral to Below-knee popliteasl BPG    by Dr. Bridgett Larsson     OB History   None      Home Medications    Prior to Admission medications   Medication Sig Start Date End Date Taking? Authorizing Alicia [name]  aspirin EC 325 MG EC tablet Take 1 tablet (325 mg total) by mouth daily. 03/19/18   Shelly Coss, MD  atorvastatin (LIPITOR) 40 MG tablet Take 1 tablet (40 mg total) by mouth daily at 6 PM. 03/18/18   Shelly Coss, MD  benzonatate (TESSALON) 200 MG capsule Take 1 capsule (200 mg total) by mouth every 8 (eight) hours. 03/27/18   Wieters, Hallie C, PA-C  clopidogrel (PLAVIX) 75 MG tablet Take 1 tablet (75 mg total) by mouth daily. 03/19/18   Shelly Coss, MD  diazepam (VALIUM) 5 MG tablet Take 1 tablet (5 mg total) by mouth 2 (two) times daily. Alicia [name] not taking: Reported on 03/24/2017 07/29/13   Carvel Getting, NP  empagliflozin (JARDIANCE) 10 MG TABS tablet Take 10 mg by mouth daily as needed (high blood sugar (CBG >200)).     Alicia [name], Historical, MD  enalapril (VASOTEC) 20 MG tablet Take 20 mg by mouth 2 (two) times daily.  05/24/14   Alicia [name], Historical, MD  famotidine (PEPCID) 20 MG tablet Take 20 mg by mouth daily as needed for heartburn or indigestion.     Alicia [name], Historical, MD  hydrochlorothiazide (HYDRODIURIL) 25 MG tablet Take 25 mg by mouth daily.  01/26/18   Alicia [name], Historical, MD  labetalol (NORMODYNE) 100 MG tablet Take 2 tablets (200 mg total) by mouth 2 (two) times daily. 03/27/18 04/26/18  Wieters, Hallie C, PA-C  metFORMIN (GLUCOPHAGE) 1000 MG tablet Take 1 tablet (1,000 mg total) by mouth 2 (two) times daily with a meal. 03/18/18   Shelly Coss, MD  NIFEdipine (ADALAT CC) 90 MG 24 hr tablet Take 1 tablet (90 mg total) by mouth daily. 03/27/18 04/26/18  Wieters, Hallie C, PA-C  oxyCODONE-acetaminophen (PERCOCET/ROXICET) 5-325 MG per tablet Take 1 tablet by mouth every 6 (six) hours as needed for pain. Alicia [name] not taking: Reported on 03/24/2017 07/29/13   Carvel Getting, NP    Family History Family History  Problem  Relation Age of Onset  . Diabetes Father   . Heart disease Mother        NOT before age 80-  Bypass  . Hypertension Mother   . Hypertension Sister   . Varicose Veins Brother   . Heart disease Brother        Before age 70  . Hypertension Daughter     Social History Social History   Tobacco Use  . Smoking status: Light Tobacco Smoker    Packs/day: 0.50    Years: 35.00    Pack years: 17.50    Types: Cigarettes  . Smokeless tobacco: Never Used  . Tobacco comment: Alicia [name] is trying to quit Alicia has gone down to half a pack per day  Substance Use Topics  . Alcohol use: Yes    Alcohol/week: 0.5 oz    Types: 1 Standard drinks or equivalent per week    Comment: occasional use only  . Drug use: No     Allergies   Alicia [name] has no known allergies.   Review of Systems Review of Systems  Constitutional: Positive for fatigue.  Allergic/Immunologic: Positive for immunocompromised state.       Diabetic  Neurological: Positive for weakness Alicia light-headedness.  All other systems reviewed Alicia are negative.    Physical Exam Updated Vital Signs BP (!) 190/78   Pulse 66   Temp 98.8 F (37.1 C)   Resp 20   Ht 5\' 8"  (1.727 m)   Wt 116.1 kg (256 lb)   SpO2 97%   BMI 38.92 kg/m   Physical Exam  Constitutional: She appears well-developed Alicia well-nourished.  HENT:  Head: Normocephalic Alicia atraumatic.  Eyes: Pupils are equal, round, Alicia reactive to light. Conjunctivae are normal.  Neck: Neck supple. No JVD present. No tracheal deviation present. No thyromegaly present.  No bruit  Cardiovascular: Normal rate Alicia regular rhythm.  No murmur heard. Pulmonary/Chest: Effort normal Alicia breath sounds normal.  Abdominal: Soft. Bowel sounds are normal. She exhibits no distension. There is no tenderness.  Obese  Genitourinary: Rectal exam shows guaiac negative stool.  Genitourinary Comments:  Rectum:Normal tone brown stool Hemoccult negative  Musculoskeletal: Normal range of motion.  She exhibits no edema or tenderness.  Neurological: She is alert. Coordination normal.  Skin: Skin is warm Alicia dry. No rash noted.  Psychiatric: She has a normal mood Alicia affect.  Nursing note Alicia vitals reviewed.    ED Treatments / Results  Labs (all labs ordered are listed, but only abnormal results are displayed) Labs Reviewed  BASIC METABOLIC PANEL  CBC WITH DIFFERENTIAL/PLATELET  I-STAT TROPONIN, ED    EKG EKG Interpretation  Date/Time:  Tuesday Apr 03 2018 16:02:16 EDT Ventricular Rate:  69 PR Interval:    QRS Duration: 104  QT Interval:  471 QTC Calculation: 505 R Axis:   -12 Text Interpretation:  Age not entered, assumed to be  67 years old for purpose of ECG interpretation Sinus rhythm Biatrial enlargement Prolonged QT interval Baseline wander in lead(s) V2 No significant change since last tracing Confirmed by Orlie Dakin [phone]) on 04/03/2018 4:08:18 PM  Results for orders placed or performed during Alicia hospital encounter of 30/16/01  Basic metabolic panel  Result Value Ref Range   Sodium 140 135 - 145 mmol/L   Potassium 3.7 3.5 - 5.1 mmol/L   Chloride 102 101 - 111 mmol/L   CO2 27 22 - 32 mmol/L   Glucose, Bld 182 (H) 65 - 99 mg/dL   BUN 18 6 - 20 mg/dL   Creatinine, Ser 1.06 (H) 0.44 - 1.00 mg/dL   Calcium 8.6 (L) 8.9 - 10.3 mg/dL   GFR calc non Af Amer 53 (L) >60 mL/min   GFR calc Af Amer >60 >60 mL/min   Anion gap 11 5 - 15  CBC with Differential/Platelet  Result Value Ref Range   WBC 8.4 4.0 - 10.5 K/uL   RBC 3.99 3.87 - 5.11 MIL/uL   Hemoglobin 11.9 (L) 12.0 - 15.0 g/dL   HCT 36.6 36.0 - 46.0 %   MCV 91.7 78.0 - 100.0 fL   MCH 29.8 26.0 - 34.0 pg   MCHC 32.5 30.0 - 36.0 g/dL   RDW 13.2 11.5 - 15.5 %   Platelets 274 150 - 400 K/uL   Neutrophils Relative % 75 %   Lymphocytes Relative 15 %   Monocytes Relative 6 %   Eosinophils Relative 2 %   Basophils Relative 2 %   Neutro Abs 6.2 1.7 - 7.7 K/uL   Lymphs Abs 1.3 0.7 - 4.0 K/uL   Monocytes  Absolute 0.5 0.1 - 1.0 K/uL   Eosinophils Absolute 0.2 0.0 - 0.7 K/uL   Basophils Absolute 0.2 (H) 0.0 - 0.1 K/uL   Smear Review PLATELETS APPEAR ADEQUATE   I-stat troponin, ED  Result Value Ref Range   Troponin i, poc 0.01 0.00 - 0.08 ng/mL   Comment 3          POC occult blood, ED Alicia [name] will collect  Result Value Ref Range   Fecal Occult Bld NEGATIVE NEGATIVE   Ct Angio Head W Or Alicia [name] Contrast  Result Date: 03/16/2018 CLINICAL DATA:  Followup stroke in Alicia right posterior frontal white matter. EXAM: CT ANGIOGRAPHY HEAD Alicia NECK TECHNIQUE: Multidetector CT imaging of Alicia head Alicia neck was performed using Alicia standard protocol during bolus administration of intravenous contrast. Multiplanar CT image reconstructions Alicia MIPs were obtained to evaluate Alicia vascular anatomy. Carotid stenosis measurements (when applicable) are obtained utilizing NASCET criteria, using Alicia distal internal carotid diameter as Alicia denominator. CONTRAST:  53mL ISOVUE-370 IOPAMIDOL (ISOVUE-370) INJECTION 76% COMPARISON:  MRI yesterday. FINDINGS: CT HEAD FINDINGS Brain: Mild generalized atrophy. Moderate changes of chronic small vessel disease of Alicia hemispheric white matter. Old pontine Alicia thalamic strokes. Tiny low-density probably correlating with Alicia acute stroke on MRI in Alicia right corona radiata. No evidence of stroke extension or hemorrhage. No mass effect or shift. No hydrocephalus or extra-axial collection. Question benign dural calcifications versus calcified meningioma along Alicia sphenoid wing on Alicia left. No significant mass-effect upon Alicia brain. Vascular: There is atherosclerotic calcification of Alicia major vessels at Alicia base of Alicia brain. Skull: Negative Sinuses: Clear Orbits: Normal Review of Alicia MIP images confirms Alicia above findings CTA NECK  FINDINGS Aortic arch: Atherosclerotic calcification without aneurysm or dissection. Branching pattern of Alicia brachiocephalic vessels is normal without origin stenosis.  Right carotid system: Common carotid artery is tortuous but widely patent to Alicia bifurcation. There is calcified Alicia soft plaque at Alicia carotid bifurcation but Alicia minimal diameter of Alicia proximal ICA is 5 mm, this same as Alicia more distal cervical ICA, therefore no stenosis. Left carotid system: Common carotid artery is mildly tortuous but widely patent to Alicia bifurcation. Soft Alicia calcified plaque at Alicia bifurcation but no stenosis or irregularity. Cervical ICA tortuous but widely patent. Vertebral arteries: Left vertebral artery origin is patent. Left vertebral artery shows scattered foci of atherosclerotic plaque, with stenoses in Alicia range of 30-40%, but Alicia vessel is patent through Alicia cervical region to Alicia foramen magnum. Alicia right vertebral artery is occluded at its origin Alicia does not reconstitute in Alicia cervical region. Skeleton: Minimal cervical spondylosis. Other neck: No mass or lymphadenopathy. Upper chest: Negative Review of Alicia MIP images confirms Alicia above findings CTA HEAD FINDINGS Anterior circulation: Both internal carotid arteries are patent through Alicia skull base Alicia siphon region. There is ordinary peripheral atherosclerotic calcification in Alicia carotid siphon regions, with stenoses likely in Alicia 30-50% range on each side. Alicia anterior Alicia middle cerebral vessels are patent without correctable proximal stenosis, aneurysm or vascular malformation. There is atherosclerotic narrowing Alicia irregularity in Alicia more distal branch vessels, particularly in Alicia right superior division M2 region. This would place Alicia Alicia [name] at some risk of a right MCA territory infarction. No large vessel occlusion. Posterior circulation: Alicia left vertebral artery is widely patent through Alicia foramen magnum to Alicia basilar. Alicia right vertebral artery reconstitutes by cervical collaterals at Alicia foramen magnum. There is calcified plaque at Alicia foramen magnum with stenosis of 50-70%. Right PICA is visualized. Distal to  that, Alicia vessel is occluded again. No basilar stenosis. Superior cerebellar arteries Alicia posterior cerebral arteries are patent. There are stenoses in both PCA territories. Venous sinuses: Patent Alicia normal Anatomic variants: None significant Delayed phase: No abnormal enhancement Review of Alicia MIP images confirms Alicia above findings IMPRESSION: No carotid bifurcation disease of significance. 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 Left vertebral artery sufficiently patent to Alicia basilar. Posterior circulation branch vessels are patent but do show medium vessel stenoses. Atherosclerotic disease in both carotid siphon regions with serial stenoses in Alicia range of 30-50% on each side. Superior division M2 stenosis on Alicia right, placing Alicia Alicia [name] at risk of right MCA infarction. One could consider neuro interventional consultation regarding this stenosis, given Alicia recent infarction. Electronically Signed   By: Nelson Chimes M.D.   On: 03/16/2018 16:03   Dg Chest 2 View  Result Date: 03/23/2018 CLINICAL DATA:  Shortness of breath Alicia chest pain. EXAM: CHEST - 2 VIEW COMPARISON:  Chest x-ray dated March 15, 2018. FINDINGS: Stable moderate cardiomegaly. Normal pulmonary vascularity. No focal consolidation, pleural effusion, or pneumothorax. No acute osseous abnormality. IMPRESSION: Stable moderate cardiomegaly.  No active cardiopulmonary disease. Electronically Signed   By: Titus Dubin M.D.   On: 03/23/2018 20:06   Dg Chest 2 View  Result Date: 03/15/2018 CLINICAL DATA:  Chest pain EXAM: CHEST - 2 VIEW COMPARISON:  02/17/2011 FINDINGS: No pleural effusion. Moderate cardiomegaly increased compared to prior. No pleural effusion or focal consolidation. Mild vascular congestion. No pneumothorax. IMPRESSION: Moderate cardiomegaly with vascular  congestion, increased compared to  prior from 2012. Could consider component of pericardial effusion. Electronically Signed   By: Donavan Foil M.D.   On: 03/15/2018 19:01   Dg Lumbar Spine Complete  Result Date: 03/15/2018 CLINICAL DATA:  Fall EXAM: LUMBAR SPINE - COMPLETE 4+ VIEW COMPARISON:  CT 03/28/2018 FINDINGS: Lumbar alignment within normal limits. Mild degenerative changes at L3-L4 Alicia L5-S1. Questionable mild superior endplate deformity at L1. Aortic atherosclerosis. IMPRESSION: 1. Possible mild superior endplate deformity at L1 2. Degenerative changes. Electronically Signed   By: Donavan Foil M.D.   On: 03/15/2018 19:03   Ct Angio Neck W Or Alicia [name] Contrast  Result Date: 03/16/2018 CLINICAL DATA:  Followup stroke in Alicia right posterior frontal white matter. EXAM: CT ANGIOGRAPHY HEAD Alicia NECK TECHNIQUE: Multidetector CT imaging of Alicia head Alicia neck was performed using Alicia standard protocol during bolus administration of intravenous contrast. Multiplanar CT image reconstructions Alicia MIPs were obtained to evaluate Alicia vascular anatomy. Carotid stenosis measurements (when applicable) are obtained utilizing NASCET criteria, using Alicia distal internal carotid diameter as Alicia denominator. CONTRAST:  29mL ISOVUE-370 IOPAMIDOL (ISOVUE-370) INJECTION 76% COMPARISON:  MRI yesterday. FINDINGS: CT HEAD FINDINGS Brain: Mild generalized atrophy. Moderate changes of chronic small vessel disease of Alicia hemispheric white matter. Old pontine Alicia thalamic strokes. Tiny low-density probably correlating with Alicia acute stroke on MRI in Alicia right corona radiata. No evidence of stroke extension or hemorrhage. No mass effect or shift. No hydrocephalus or extra-axial collection. Question benign dural calcifications versus calcified meningioma along Alicia sphenoid wing on Alicia left. No significant mass-effect upon Alicia brain. Vascular: There is atherosclerotic calcification of Alicia major vessels at Alicia base of Alicia brain. Skull: Negative Sinuses: Clear Orbits: Normal  Review of Alicia MIP images confirms Alicia above findings CTA NECK FINDINGS Aortic arch: Atherosclerotic calcification without aneurysm or dissection. Branching pattern of Alicia brachiocephalic vessels is normal without origin stenosis. Right carotid system: Common carotid artery is tortuous but widely patent to Alicia bifurcation. There is calcified Alicia soft plaque at Alicia carotid bifurcation but Alicia minimal diameter of Alicia proximal ICA is 5 mm, this same as Alicia more distal cervical ICA, therefore no stenosis. Left carotid system: Common carotid artery is mildly tortuous but widely patent to Alicia bifurcation. Soft Alicia calcified plaque at Alicia bifurcation but no stenosis or irregularity. Cervical ICA tortuous but widely patent. Vertebral arteries: Left vertebral artery origin is patent. Left vertebral artery shows scattered foci of atherosclerotic plaque, with stenoses in Alicia range of 30-40%, but Alicia vessel is patent through Alicia cervical region to Alicia foramen magnum. Alicia right vertebral artery is occluded at its origin Alicia does not reconstitute in Alicia cervical region. Skeleton: Minimal cervical spondylosis. Other neck: No mass or lymphadenopathy. Upper chest: Negative Review of Alicia MIP images confirms Alicia above findings CTA HEAD FINDINGS Anterior circulation: Both internal carotid arteries are patent through Alicia skull base Alicia siphon region. There is ordinary peripheral atherosclerotic calcification in Alicia carotid siphon regions, with stenoses likely in Alicia 30-50% range on each side. Alicia anterior Alicia middle cerebral vessels are patent without correctable proximal stenosis, aneurysm or vascular malformation. There is atherosclerotic narrowing Alicia irregularity in Alicia more distal branch vessels, particularly in Alicia right superior division M2 region. This would place Alicia Alicia [name] at some risk of a right MCA territory infarction. No large vessel occlusion. Posterior circulation: Alicia left vertebral artery is widely patent through Alicia  foramen magnum to Alicia basilar. Alicia right vertebral artery reconstitutes by cervical collaterals  at Alicia foramen magnum. There is calcified plaque at Alicia foramen magnum with stenosis of 50-70%. Right PICA is visualized. Distal to that, Alicia vessel is occluded again. No basilar stenosis. Superior cerebellar arteries Alicia posterior cerebral arteries are patent. There are stenoses in both PCA territories. Venous sinuses: Patent Alicia normal Anatomic variants: None significant Delayed phase: No abnormal enhancement Review of Alicia MIP images confirms Alicia above findings IMPRESSION: No carotid bifurcation disease of significance. 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 Left vertebral artery sufficiently patent to Alicia basilar. Posterior circulation branch vessels are patent but do show medium vessel stenoses. Atherosclerotic disease in both carotid siphon regions with serial stenoses in Alicia range of 30-50% on each side. Superior division M2 stenosis on Alicia right, placing Alicia Alicia [name] at risk of right MCA infarction. One could consider neuro interventional consultation regarding this stenosis, given Alicia recent infarction. Electronically Signed   By: Nelson Chimes M.D.   On: 03/16/2018 16:03   Mr Brain Alicia [name] Contrast  Result Date: 03/24/2018 CLINICAL DATA:  67 y/o F; recent admission for stroke. Alicia [name] presents with weakness Alicia lightheadedness. EXAM: MRI HEAD WITHOUT CONTRAST TECHNIQUE: Multiplanar, multiecho pulse sequences of Alicia brain Alicia surrounding structures were obtained without intravenous contrast. COMPARISON:  03/23/2018 CT head.  03/15/2018 MRI head. FINDINGS: Brain: No acute infarction, hemorrhage, hydrocephalus, extra-axial collection or mass lesion. Late subacute 5 mm infarct in Alicia right mid corona radiata is stable. Several patchy nonspecific foci of T2 FLAIR hyperintense signal  abnormality in subcortical Alicia periventricular white matter are compatible with moderate chronic microvascular ischemic changes for age. Moderate brain parenchymal volume loss. Very small chronic infarctions are present in Alicia right cerebellar hemisphere Alicia mid pons. Chronic lacunar infarcts within left anterior limb of internal capsule Alicia thalamus. Punctate foci of susceptibility hypointensity are present within Alicia bilateral thalami compatible hemosiderin deposition of chronic microhemorrhage. Vascular: Normal flow voids. Skull Alicia upper cervical spine: Normal marrow signal. Sinuses/Orbits: Bilateral maxillary sinus mucous retention cysts Alicia mild paranasal sinus mucosal thickening. No significant abnormal signal of mastoid air cells. Orbits are unremarkable. Other: None. IMPRESSION: 1. No acute intracranial abnormality identified. 2. Late subacute 5 mm infarct in right mid corona radiata is stable. 3. Moderate chronic microvascular ischemic changes Alicia parenchymal volume loss of Alicia brain. 4. Small chronic infarcts in right cerebellum, pons, Alicia left basal ganglia are stable. Electronically Signed   By: Kristine Garbe M.D.   On: 03/24/2018 00:51   Mr Brain Alicia [name] Contrast  Result Date: 03/15/2018 CLINICAL DATA:  67 y/o F; generalized weakness, lightheadedness, Alicia multiple falls. EXAM: MRI HEAD WITHOUT CONTRAST TECHNIQUE: Multiplanar, multiecho pulse sequences of Alicia brain Alicia surrounding structures were obtained without intravenous contrast. COMPARISON:  None. FINDINGS: Brain: 5 mm focus of reduced diffusion within Alicia right mid corona radiata compatible with acute/early subacute infarction. No hemorrhage or mass effect. Very small chronic infarctions are present within Alicia right cerebellar hemisphere Alicia mid pons. Chronic lacunar infarctions are present within Alicia left anterior limb of internal capsule Alicia thalamus. Several patchynonspecific foci of T2 FLAIR hyperintense signal abnormality in  subcortical Alicia periventricular white matter are compatible withmoderatechronic microvascular ischemic changes for age. Moderatebrain parenchymal volume loss. Punctate foci of susceptibility hypointensity are present within Alicia bilateral basal ganglia compatible with hemosiderin deposition of chronic microhemorrhage. Vascular: Normal flow voids. Skull Alicia upper cervical spine: Normal marrow signal. Sinuses/Orbits: Small right  maxillary sinus mucous retention cyst. Otherwise negative. 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 IMPRESSION: 1. 5 mm acute/early subacute infarction within Alicia right mid corona radiata. No associated hemorrhage or mass effect. 2. Moderate for age chronic microvascular ischemic changes Alicia parenchymal volume loss of Alicia brain. 3. Small chronic infarctions in Alicia right cerebellum, mid pons, Alicia left basal ganglia. 4. Foci of chronic microhemorrhage in central distribution likely related to chronic hypertension. These results were called by telephone at Alicia time of interpretation on 03/15/2018 at 9:56 pm to Dr. Gareth Morgan , who verbally acknowledged these results. Electronically Signed   By: Kristine Garbe M.D.   On: 03/15/2018 21:58   Ct Head Code Stroke Alicia [name] Contrast  Result Date: 03/23/2018 CLINICAL DATA:  Code stroke. Acute onset of right-sided weakness right-sided facial droop. EXAM: CT HEAD WITHOUT CONTRAST TECHNIQUE: Contiguous axial images were obtained from Alicia base of Alicia skull through Alicia vertex without intravenous contrast. COMPARISON:  CTA head Alicia neck 03/16/2018. FINDINGS: Brain: A remote lacunar infarct is again noted in Alicia left thalamus. Chronic white matter hypoattenuation is present within Alicia left internal capsule anteriorly. Diffuse periventricular Alicia subcortical white matter hypoattenuation is stable bilaterally. A remote medial inferior right cerebellar infarct is present. No acute infarct, hemorrhage, or mass lesion  is present. Alicia ventricles are proportionate to Alicia degree of atrophy. Brainstem Alicia cerebellum are otherwise within normal limits. Vascular: Atherosclerotic calcifications are present within Alicia cavernous internal carotid arteries bilaterally. Additional calcifications are present along Alicia dural margin of Alicia right vertebral artery. There is no hyperdense vessel. Skull: Calvarium is intact. No focal lytic or blastic lesions are present. Sinuses/Orbits: Mild mucosal thickening is noted anteriorly in Alicia right maxillary sinus. Alicia paranasal sinuses Alicia mastoid air cells are otherwise clear. 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 Globes Alicia orbits are otherwise within normal limits. ([place] Stroke Program Early CT Score) - Ganglionic level infarction (caudate, lentiform nuclei, internal capsule, insula, M1-M3 cortex): 7/7 - Supraganglionic infarction (M4-M6 cortex): 3/3 Total score (0-10 with 10 being normal): 10/10 IMPRESSION: 1. No acute intracranial abnormality. 2. 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 3. Stable remote right cerebellar infarcts. 4. Stable chronic white matter disease. 5. ASPECTS is 10/10 Alicia above was relayed via text pager to Dr. Roland Rack on 03/23/2018 at 18:52 . Electronically Signed   By: San Morelle M.D.   On: 03/23/2018 18:53    Radiology No results found.  Procedures Procedures (including critical care time)  Medications Ordered in ED Medications - No data to display   Initial Impression / Assessment Alicia Plan / ED Course  I have reviewed Alicia triage vital signs Alicia Alicia nursing notes.  Pertinent labs & imaging results that were available during my care of Alicia Alicia [name] were reviewed by me Alicia considered in my medical decision making (see chart for details).     6:03 PM orthostatic vital signs performed.  Alicia [name] reports lightheadedness with standing.  I consulted Dr. Loleta Books who will see Alicia [name] in Alicia emergency department he suggests evening  blood pressure medications administered as well as normal saline intravenous bolus.  I have ordered 1 L normal saline IV bolus as well as oral enalapril Alicia labetalol 8:35 PM Alicia [name] is alert.  She becomes extremely lightheaded upon sitting up in bed from a supine position Alicia is unable to stand for more than 10 seconds without having to sit down due to lightheadedness after treatment with intravenous normal saline Alicia antihypertensive  medication.  I recontacted Dr. Loleta Books who will arrange for overnight stay .lab work remarkable for hyperglycemia Alicia mild anemia.  Alicia [name] likely syncopized due to orthostatic changes Final Clinical Impressions(s) / ED Diagnoses   diagnosis #1 syncope Final diagnoses:  None  #2 orthostasis #3 hyperglycemia #4 anemia  ED Discharge Orders    None       Orlie Dakin, MD 04/03/18 2046 CRITICAL CARE Performed by: Orlie Dakin Total critical care time: 30 minutes Critical care time was exclusive of separately billable procedures Alicia treating other patients. Critical care was necessary to treat or prevent imminent or life-threatening deterioration. Critical care was time spent personally by me on Alicia following activities: development of treatment plan with Alicia [name] Alicia/or surrogate as well as nursing, discussions with consultants, evaluation of Alicia [name]'s response to treatment, examination of Alicia [name], obtaining history from Alicia [name] or surrogate, ordering Alicia performing treatments Alicia interventions, ordering Alicia review of laboratory studies, ordering Alicia review of radiographic studies, pulse oximetry Alicia re-evaluation of Alicia [name]'s condition.   Orlie Dakin, MD 04/18/18 1135

## 2018-04-03 NOTE — ED Triage Notes (Signed)
Per ems pt was at home and had three syncope episodes. Pt has had N/V today. Symptoms started today. Denies diarrhea, leaned over and felt light headed and passed out over the bed. Did not fall. EMS witnessed pt vomiitted about 271ml.  160 cbg, 170/90, alert oriented x 4.

## 2018-04-04 DIAGNOSIS — R531 Weakness: Secondary | ICD-10-CM | POA: Diagnosis not present

## 2018-04-04 DIAGNOSIS — I951 Orthostatic hypotension: Secondary | ICD-10-CM | POA: Diagnosis not present

## 2018-04-04 DIAGNOSIS — I160 Hypertensive urgency: Secondary | ICD-10-CM | POA: Diagnosis not present

## 2018-04-04 DIAGNOSIS — E669 Obesity, unspecified: Secondary | ICD-10-CM | POA: Diagnosis not present

## 2018-04-04 DIAGNOSIS — R55 Syncope and collapse: Secondary | ICD-10-CM | POA: Diagnosis not present

## 2018-04-04 DIAGNOSIS — I739 Peripheral vascular disease, unspecified: Secondary | ICD-10-CM | POA: Diagnosis not present

## 2018-04-04 DIAGNOSIS — E1169 Type 2 diabetes mellitus with other specified complication: Secondary | ICD-10-CM | POA: Diagnosis not present

## 2018-04-04 LAB — BASIC METABOLIC PANEL
ANION GAP: 11 (ref 5–15)
BUN: 14 mg/dL (ref 6–20)
CALCIUM: 8.5 mg/dL — AB (ref 8.9–10.3)
CHLORIDE: 100 mmol/L — AB (ref 101–111)
CO2: 29 mmol/L (ref 22–32)
Creatinine, Ser: 0.94 mg/dL (ref 0.44–1.00)
GFR calc non Af Amer: >60 mL/min (ref >60)
GLUCOSE: 136 mg/dL — AB (ref 65–99)
Potassium: 3.2 mmol/L — ABNORMAL LOW (ref 3.5–5.1)
Sodium: 140 mmol/L (ref 135–145)

## 2018-04-04 LAB — GLUCOSE, CAPILLARY
GLUCOSE-CAPILLARY: 157 mg/dL — AB (ref 65–99)
Glucose-Capillary: 163 mg/dL — ABNORMAL HIGH (ref 65–99)
Glucose-Capillary: 158 mg/dL — ABNORMAL HIGH (ref 65–99)

## 2018-04-04 NOTE — Care Management Obs Status (Signed)
[place] NOTIFICATION   Patient Details  Name: Alicia [name] MRN: [id number] Date of Birth: 07/20/1951   Medicare Observation Status Notification Given:  Yes    Carles Collet, RN 04/04/2018, 9:46 AM

## 2018-04-04 NOTE — Discharge Summary (Signed)
Physician Discharge Summary  Alicia [name] BPZ:[id number] DOB: 07/27/1951 DOA: 04/03/2018  PCP: Lucianne Lei, MD  Admit date: 04/03/2018 Discharge date: 04/04/2018  Admitted From: Home  Disposition:  Home   Recommendations for Outpatient Follow-up:  1. Follow up with PCP in 1-2 weeks  Home Health: Renewed   Equipment/Devices: None  Discharge Condition: Good  CODE STATUS: FULL Diet recommendation: Cardiac  Brief/Interim Summary: Mrs. Alicia [name] is a 67 y.o. female with a past medical history significant for dCHF, recent CVA, PVD hx left fem-pop bypass, NIDDM, active smoker, and poorly controlled HTN who presents with dizziness and syncope x3.  Recently presented to the ER 4/25 with fatigue, dizziness, found to have subcentimeter corona-radiata stroke, severe range hypertension.  Was started on Plavix, BP meds titrated, was discharged to home with HHPT.  Since being home by herself, the patient has returned to the ER twice for similar symptoms, generalized fatigue, bilateral leg weakness, lightheadedness and dizziness.  On 5/3, the first time, she had a MRI brain, was normal, was discharged.  Second time, she was given fluids and antihypertensives, felt better, was discharged.    Today, she was at her recent baseline (weak, tired, but functional), got up, made coffee and watched TV.  She skipped breakfast, and by late afternoon, she went to the kitchen to make herself a sandwich, but when she sat back down, she felt malaise, hot, "very lightheaded" and "dizzier than I've ever been in my life", "like I was going to die."  She had to lie down for a while, and eventually, tried to get up, at which time she became nauseated, threw up, and then passed out.  Every time she would try to get up, her symptoms were worse, and she would be dizzy and passed out twice more.  EMS were called.         Orthostatic syncope Patient was given IV fluids overnight.  In the morning, she had good  appetite, felt overall weak, as she has for the last three months, but was comfortable, ambulatory and without orthostatic symptoms.  Severe range hypertension The patient had no encephalopathy, troponin leak, AKI, no CHF.  She had severe range pressure, initially well-controlled with her home evening regimen.  In the morning, her blood pressure was persistently high despite her home medications.  Augmentation with a fifth agent was recommended emphatically, but the patient declined repeatedly.  Patient has fixed belief that her blood pressure medicines are causing her dizziness and weakness.  The patient was recommended to follow with her PCP closely for titration off Labetalol given her fatigue and titration up on either hydralazine, Imdur, or spironolactone.        Discharge Diagnoses:  Principal Problem:   Orthostatic hypotension Active Problems:   Peripheral vascular disease, unspecified ([place])   Hypertensive urgency   Diabetes mellitus type 2 in obese ([facility])   Morbid obesity ([place])   Weakness   Syncope    Discharge Instructions  Discharge Instructions    Diet - low sodium heart healthy   Complete by:  As directed    Discharge instructions   Complete by:  As directed    From Dr. Loleta Books: You were admitted with a passing out episode.   We found in the ER that this was from dehydration (you hadn't eaten breakfast, then vomited, and the combination was too much for your body).   While you were here, we found no evidence that your passing out spell was from your heart, nor from  your stroke.    Resume your blood pressure medicines: Enalapril Nifedipine HCTZ Labetalol  Follow up with Dr. Criss Rosales in one week, call her office for a follow up appointment If Dr. Criss Rosales agrees, ask her for a referral to a Cardiologist (heart specialist) for further adjustments to your blood pressure regimen.   Increase activity slowly   Complete by:  As directed      Allergies as of 04/04/2018       Reactions   Nifedipine Er Other (See Comments)   Caused nose bleeds in higher doses (90 mg., namely)   Latex Rash      Medication List    TAKE these medications   acetaminophen 500 MG tablet Commonly known as:  TYLENOL Take 500-1,000 mg by mouth every 6 (six) hours as needed (for pain).   aspirin 325 MG EC tablet Take 1 tablet (325 mg total) by mouth daily.   atorvastatin 40 MG tablet Commonly known as:  LIPITOR Take 1 tablet (40 mg total) by mouth daily at 6 PM.   benzonatate 200 MG capsule Commonly known as:  TESSALON Take 1 capsule (200 mg total) by mouth every 8 (eight) hours.   clopidogrel 75 MG tablet Commonly known as:  PLAVIX Take 1 tablet (75 mg total) by mouth daily.   enalapril 20 MG tablet Commonly known as:  VASOTEC Take 20 mg by mouth 2 (two) times daily.   hydrochlorothiazide 25 MG tablet Commonly known as:  HYDRODIURIL Take 25 mg by mouth daily.   labetalol 100 MG tablet Commonly known as:  NORMODYNE Take 2 tablets (200 mg total) by mouth 2 (two) times daily.   metFORMIN 1000 MG tablet Commonly known as:  GLUCOPHAGE Take 1 tablet (1,000 mg total) by mouth 2 (two) times daily with a meal.   NIFEdipine 60 MG 24 hr tablet Commonly known as:  PROCARDIA-XL/ADALAT CC Take 60 mg by mouth daily.      Follow-up [facility].   Why:  [facility] services will continue to go to your home Contact information: [place] [address] [phone]          Allergies  Allergen Reactions  . Nifedipine Er Other (See Comments)    Caused nose bleeds in higher doses (90 mg., namely)  . Latex Rash    Consultations:  None   Procedures/Studies: Ct Angio Head W Or Wo Contrast  Result Date: 03/16/2018 CLINICAL DATA:  Followup stroke in the right posterior frontal white matter. EXAM: CT ANGIOGRAPHY HEAD AND NECK TECHNIQUE: Multidetector CT imaging of the head and neck was performed using the  standard protocol during bolus administration of intravenous contrast. Multiplanar CT image reconstructions and MIPs were obtained to evaluate the vascular anatomy. Carotid stenosis measurements (when applicable) are obtained utilizing NASCET criteria, using the distal internal carotid diameter as the denominator. CONTRAST:  36mL ISOVUE-370 IOPAMIDOL (ISOVUE-370) INJECTION 76% COMPARISON:  MRI yesterday. FINDINGS: CT HEAD FINDINGS Brain: Mild generalized atrophy. Moderate changes of chronic small vessel disease of the hemispheric white matter. Old pontine and thalamic strokes. Tiny low-density probably correlating with the acute stroke on MRI in the right corona radiata. No evidence of stroke extension or hemorrhage. No mass effect or shift. No hydrocephalus or extra-axial collection. Question benign dural calcifications versus calcified meningioma along the sphenoid wing on the left. No significant mass-effect upon the brain. Vascular: There is atherosclerotic calcification of the major vessels at the base of the  brain. Skull: Negative Sinuses: Clear Orbits: Normal Review of the MIP images confirms the above findings CTA NECK FINDINGS Aortic arch: Atherosclerotic calcification without aneurysm or dissection. Branching pattern of the brachiocephalic vessels is normal without origin stenosis. Right carotid system: Common carotid artery is tortuous but widely patent to the bifurcation. There is calcified and soft plaque at the carotid bifurcation but the minimal diameter of the proximal ICA is 5 mm, this same as the more distal cervical ICA, therefore no stenosis. Left carotid system: Common carotid artery is mildly tortuous but widely patent to the bifurcation. Soft and calcified plaque at the bifurcation but no stenosis or irregularity. Cervical ICA tortuous but widely patent. Vertebral arteries: Left vertebral artery origin is patent. Left vertebral artery shows scattered foci of atherosclerotic plaque, with  stenoses in the range of 30-40%, but the vessel is patent through the cervical region to the foramen magnum. 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 Skeleton: Minimal cervical spondylosis. Other neck: No mass or lymphadenopathy. Upper chest: Negative Review of the MIP images confirms the above findings CTA HEAD FINDINGS Anterior circulation: Both internal carotid arteries are patent through the skull base and siphon region. There is ordinary peripheral atherosclerotic calcification in the carotid siphon regions, with stenoses likely in the 30-50% range on each side. The anterior and middle cerebral vessels are patent without correctable proximal stenosis, aneurysm or vascular malformation. There is atherosclerotic narrowing and irregularity in the more distal branch vessels, particularly in the right superior division M2 region. This would place the patient at some risk of a right MCA territory infarction. No large vessel occlusion. Posterior circulation: The left vertebral artery is widely patent through the foramen magnum to the basilar. The right vertebral artery reconstitutes by cervical collaterals at the foramen magnum. There is calcified plaque at the foramen magnum with stenosis of 50-70%. Right PICA is visualized. Distal to that, the vessel is occluded again. No basilar stenosis. Superior cerebellar arteries and posterior cerebral arteries are patent. There are stenoses in both PCA territories. Venous sinuses: Patent and normal Anatomic variants: None significant Delayed phase: No abnormal enhancement Review of the MIP images confirms the above findings IMPRESSION: No carotid bifurcation disease of significance. 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 Left vertebral artery sufficiently patent to the basilar.  Posterior circulation branch vessels are patent but do show medium vessel stenoses. Atherosclerotic disease in both carotid siphon regions with serial stenoses in the range of 30-50% on each side. Superior division M2 stenosis on the right, placing the patient at risk of right MCA infarction. One could consider neuro interventional consultation regarding this stenosis, given the recent infarction. Electronically Signed   By: Nelson Chimes M.D.   On: 03/16/2018 16:03   Dg Chest 2 View  Result Date: 03/23/2018 CLINICAL DATA:  Shortness of breath and chest pain. EXAM: CHEST - 2 VIEW COMPARISON:  Chest x-ray dated March 15, 2018. FINDINGS: Stable moderate cardiomegaly. Normal pulmonary vascularity. No focal consolidation, pleural effusion, or pneumothorax. No acute osseous abnormality. IMPRESSION: Stable moderate cardiomegaly.  No active cardiopulmonary disease. Electronically Signed   By: Titus Dubin M.D.   On: 03/23/2018 20:06   Dg Chest 2 View  Result Date: 03/15/2018 CLINICAL DATA:  Chest pain EXAM: CHEST - 2 VIEW COMPARISON:  02/17/2011 FINDINGS: No pleural effusion. Moderate cardiomegaly increased compared  to prior. No pleural effusion or focal consolidation. Mild vascular congestion. No pneumothorax. IMPRESSION: Moderate cardiomegaly with vascular congestion, increased compared to prior from 2012. Could consider component of pericardial effusion. Electronically Signed   By: Donavan Foil M.D.   On: 03/15/2018 19:01   Dg Lumbar Spine Complete  Result Date: 03/15/2018 CLINICAL DATA:  Fall EXAM: LUMBAR SPINE - COMPLETE 4+ VIEW COMPARISON:  CT 03/28/2018 FINDINGS: Lumbar alignment within normal limits. Mild degenerative changes at L3-L4 and L5-S1. Questionable mild superior endplate deformity at L1. Aortic atherosclerosis. IMPRESSION: 1. Possible mild superior endplate deformity at L1 2. Degenerative changes. Electronically Signed   By: Donavan Foil M.D.   On: 03/15/2018 19:03   Ct Angio Neck W Or Wo  Contrast  Result Date: 03/16/2018 CLINICAL DATA:  Followup stroke in the right posterior frontal white matter. EXAM: CT ANGIOGRAPHY HEAD AND NECK TECHNIQUE: Multidetector CT imaging of the head and neck was performed using the standard protocol during bolus administration of intravenous contrast. Multiplanar CT image reconstructions and MIPs were obtained to evaluate the vascular anatomy. Carotid stenosis measurements (when applicable) are obtained utilizing NASCET criteria, using the distal internal carotid diameter as the denominator. CONTRAST:  38mL ISOVUE-370 IOPAMIDOL (ISOVUE-370) INJECTION 76% COMPARISON:  MRI yesterday. FINDINGS: CT HEAD FINDINGS Brain: Mild generalized atrophy. Moderate changes of chronic small vessel disease of the hemispheric white matter. Old pontine and thalamic strokes. Tiny low-density probably correlating with the acute stroke on MRI in the right corona radiata. No evidence of stroke extension or hemorrhage. No mass effect or shift. No hydrocephalus or extra-axial collection. Question benign dural calcifications versus calcified meningioma along the sphenoid wing on the left. No significant mass-effect upon the brain. Vascular: There is atherosclerotic calcification of the major vessels at the base of the brain. Skull: Negative Sinuses: Clear Orbits: Normal Review of the MIP images confirms the above findings CTA NECK FINDINGS Aortic arch: Atherosclerotic calcification without aneurysm or dissection. Branching pattern of the brachiocephalic vessels is normal without origin stenosis. Right carotid system: Common carotid artery is tortuous but widely patent to the bifurcation. There is calcified and soft plaque at the carotid bifurcation but the minimal diameter of the proximal ICA is 5 mm, this same as the more distal cervical ICA, therefore no stenosis. Left carotid system: Common carotid artery is mildly tortuous but widely patent to the bifurcation. Soft and calcified plaque at  the bifurcation but no stenosis or irregularity. Cervical ICA tortuous but widely patent. Vertebral arteries: Left vertebral artery origin is patent. Left vertebral artery shows scattered foci of atherosclerotic plaque, with stenoses in the range of 30-40%, but the vessel is patent through the cervical region to the foramen magnum. 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 Skeleton: Minimal cervical spondylosis. Other neck: No mass or lymphadenopathy. Upper chest: Negative Review of the MIP images confirms the above findings CTA HEAD FINDINGS Anterior circulation: Both internal carotid arteries are patent through the skull base and siphon region. There is ordinary peripheral atherosclerotic calcification in the carotid siphon regions, with stenoses likely in the 30-50% range on each side. The anterior and middle cerebral vessels are patent without correctable proximal stenosis, aneurysm or vascular malformation. There is atherosclerotic narrowing and irregularity in the more distal branch vessels, particularly in the right superior division M2 region. This would place the patient at some risk of a right MCA territory infarction. No large vessel occlusion. Posterior circulation: The left vertebral artery is  widely patent through the foramen magnum to the basilar. The right vertebral artery reconstitutes by cervical collaterals at the foramen magnum. There is calcified plaque at the foramen magnum with stenosis of 50-70%. Right PICA is visualized. Distal to that, the vessel is occluded again. No basilar stenosis. Superior cerebellar arteries and posterior cerebral arteries are patent. There are stenoses in both PCA territories. Venous sinuses: Patent and normal Anatomic variants: None significant Delayed phase: No abnormal enhancement Review of the MIP images confirms the above findings IMPRESSION: No carotid bifurcation disease of significance. Right  vertebral artery is occluded at its origin and reconstituted by cervical collaterals at the foramen magnum level, supplying right PICA, after which it shows reocclusion. Left vertebral artery sufficiently patent to the basilar. Posterior circulation branch vessels are patent but do show medium vessel stenoses. Atherosclerotic disease in both carotid siphon regions with serial stenoses in the range of 30-50% on each side. Superior division M2 stenosis on the right, placing the patient at risk of right MCA infarction. One could consider neuro interventional consultation regarding this stenosis, given the recent infarction. Electronically Signed   By: Nelson Chimes M.D.   On: 03/16/2018 16:03   Mr Brain Wo Contrast  Result Date: 03/24/2018 CLINICAL DATA:  67 y/o F; recent admission for stroke. Patient presents with weakness and lightheadedness. EXAM: MRI HEAD WITHOUT CONTRAST TECHNIQUE: Multiplanar, multiecho pulse sequences of the brain and surrounding structures were obtained without intravenous contrast. COMPARISON:  03/23/2018 CT head.  03/15/2018 MRI head. FINDINGS: Brain: No acute infarction, hemorrhage, hydrocephalus, extra-axial collection or mass lesion. Late subacute 5 mm infarct in the right mid corona radiata is stable. Several patchy nonspecific foci of T2 FLAIR hyperintense signal abnormality in subcortical and periventricular white matter are compatible with moderate chronic microvascular ischemic changes for age. Moderate brain parenchymal volume loss. Very small chronic infarctions are present in the right cerebellar hemisphere and mid pons. Chronic lacunar infarcts within left anterior limb of internal capsule and thalamus. Punctate foci of susceptibility hypointensity are present within the bilateral thalami compatible hemosiderin deposition of chronic microhemorrhage. Vascular: Normal flow voids. Skull and upper cervical spine: Normal marrow signal. Sinuses/Orbits: Bilateral maxillary sinus mucous  retention cysts and mild paranasal sinus mucosal thickening. No significant abnormal signal of mastoid air cells. Orbits are unremarkable. Other: None. IMPRESSION: 1. No acute intracranial abnormality identified. 2. Late subacute 5 mm infarct in right mid corona radiata is stable. 3. Moderate chronic microvascular ischemic changes and parenchymal volume loss of the brain. 4. Small chronic infarcts in right cerebellum, pons, and left basal ganglia are stable. Electronically Signed   By: Kristine Garbe M.D.   On: 03/24/2018 00:51   Mr Brain Wo Contrast  Result Date: 03/15/2018 CLINICAL DATA:  66 y/o F; generalized weakness, lightheadedness, and multiple falls. EXAM: MRI HEAD WITHOUT CONTRAST TECHNIQUE: Multiplanar, multiecho pulse sequences of the brain and surrounding structures were obtained without intravenous contrast. COMPARISON:  None. FINDINGS: Brain: 5 mm focus of reduced diffusion within the right mid corona radiata compatible with acute/early subacute infarction. No hemorrhage or mass effect. Very small chronic infarctions are present within the right cerebellar hemisphere and mid pons. Chronic lacunar infarctions are present within the left anterior limb of internal capsule and thalamus. Several patchynonspecific foci of T2 FLAIR hyperintense signal abnormality in subcortical and periventricular white matter are compatible withmoderatechronic microvascular ischemic changes for age. Moderatebrain parenchymal volume loss. Punctate foci of susceptibility hypointensity are present within the bilateral basal ganglia compatible with hemosiderin deposition of  chronic microhemorrhage. Vascular: Normal flow voids. Skull and upper cervical spine: Normal marrow signal. Sinuses/Orbits: Small right maxillary sinus mucous retention cyst. Otherwise negative. 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 IMPRESSION: 1. 5 mm acute/early subacute infarction within the right mid  corona radiata. No associated hemorrhage or mass effect. 2. Moderate for age chronic microvascular ischemic changes and parenchymal volume loss of the brain. 3. Small chronic infarctions in the right cerebellum, mid pons, and left basal ganglia. 4. Foci of chronic microhemorrhage in central distribution likely related to chronic hypertension. These results were called by telephone at the time of interpretation on 03/15/2018 at 9:56 pm to Dr. Gareth Morgan , who verbally acknowledged these results. Electronically Signed   By: Kristine Garbe M.D.   On: 03/15/2018 21:58   Ct Head Code Stroke Wo Contrast  Result Date: 03/23/2018 CLINICAL DATA:  Code stroke. Acute onset of right-sided weakness right-sided facial droop. EXAM: CT HEAD WITHOUT CONTRAST TECHNIQUE: Contiguous axial images were obtained from the base of the skull through the vertex without intravenous contrast. COMPARISON:  CTA head and neck 03/16/2018. FINDINGS: Brain: A remote lacunar infarct is again noted in the left thalamus. Chronic white matter hypoattenuation is present within the left internal capsule anteriorly. Diffuse periventricular and subcortical white matter hypoattenuation is stable bilaterally. A remote medial inferior right cerebellar infarct is present. No acute infarct, hemorrhage, or mass lesion is present. The ventricles are proportionate to the degree of atrophy. Brainstem and cerebellum are otherwise within normal limits. Vascular: Atherosclerotic calcifications are present within the cavernous internal carotid arteries bilaterally. Additional calcifications are present along the dural margin of the right vertebral artery. There is no hyperdense vessel. Skull: Calvarium is intact. No focal lytic or blastic lesions are present. Sinuses/Orbits: Mild mucosal thickening is noted anteriorly in the right maxillary sinus. The paranasal sinuses and mastoid air cells are otherwise clear. 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  Globes and orbits are otherwise within normal limits. ([place] Stroke Program Early CT Score) - Ganglionic level infarction (caudate, lentiform nuclei, internal capsule, insula, M1-M3 cortex): 7/7 - Supraganglionic infarction (M4-M6 cortex): 3/3 Total score (0-10 with 10 being normal): 10/10 IMPRESSION: 1. No acute intracranial abnormality. 2. Stable remote lacunar infarct of the left thalamus. 3. Stable remote right cerebellar infarcts. 4. Stable chronic white matter disease. 5. ASPECTS is 10/10 The above was relayed via text pager to Dr. Roland Rack on 03/23/2018 at 18:52 . Electronically Signed   By: San Morelle M.D.   On: 03/23/2018 18:53       Subjective: Feels tired, globally fatigued.  Mild dizziness, similar to her baseline.  No confusion, chest pain, dyspnea, no exertional symptoms, leg swelling, orthopnea.  Discharge Exam: Vitals:   04/04/18 1224 04/04/18 1414  BP: (!) 189/85 (!) 206/68  Pulse: (!) 59 68  Resp: 20   Temp: 98.5 F (36.9 C)   SpO2: 96%    Vitals:   04/04/18 0404 04/04/18 0847 04/04/18 1224 04/04/18 1414  BP: (!) 171/81 (!) 198/90 (!) 189/85 (!) 206/68  Pulse: 70 67 (!) 59 68  Resp: 18 18 20    Temp: 98.9 F (37.2 C)  98.5 F (36.9 C)   TempSrc: Oral  Oral   SpO2: 94%  96%   Weight: 115.6 kg (254 lb 13.6 oz)     Height:        General: Pt is alert, awake, not in acute distress, sitting on side of bed Cardiovascular: RRR, S1/S2 +, no rubs,  no gallops Respiratory: CTA bilaterally, no wheezing, no rhonchi Abdominal: Soft, NT, ND, bowel sounds + Extremities: no edema, no cyanosis    The results of significant diagnostics from this hospitalization (including imaging, microbiology, ancillary and laboratory) are listed below for reference.     Microbiology: No results found for this or any previous visit (from the past 240 hour(s)).   Labs: BNP (last 3 results) Recent Labs    03/15/18 1806  BNP 183.3*   Basic Metabolic  Panel: Recent Labs  Lab 04/03/18 1617 04/04/18 0548  NA 140 140  K 3.7 3.2*  CL 102 100*  CO2 27 29  GLUCOSE 182* 136*  BUN 18 14  CREATININE 1.06* 0.94  CALCIUM 8.6* 8.5*   Liver Function Tests: No results for input(s): AST, ALT, ALKPHOS, BILITOT, PROT, ALBUMIN in the last 168 hours. No results for input(s): LIPASE, AMYLASE in the last 168 hours. No results for input(s): AMMONIA in the last 168 hours. CBC: Recent Labs  Lab 04/03/18 1617  WBC 8.4  NEUTROABS 6.2  HGB 11.9*  HCT 36.6  MCV 91.7  PLT 274   Cardiac Enzymes: No results for input(s): CKTOTAL, CKMB, CKMBINDEX, TROPONINI in the last 168 hours. BNP: Invalid input(s): POCBNP CBG: Recent Labs  Lab 04/03/18 2218 04/04/18 0746 04/04/18 1121  GLUCAP 163* 158* 157*   D-Dimer No results for input(s): DDIMER in the last 72 hours. Hgb A1c No results for input(s): HGBA1C in the last 72 hours. Lipid Profile No results for input(s): CHOL, HDL, LDLCALC, TRIG, CHOLHDL, LDLDIRECT in the last 72 hours. Thyroid function studies No results for input(s): TSH, T4TOTAL, T3FREE, THYROIDAB in the last 72 hours.  Invalid input(s): FREET3 Anemia work up No results for input(s): VITAMINB12, FOLATE, FERRITIN, TIBC, IRON, RETICCTPCT in the last 72 hours. Urinalysis    Component Value Date/Time   COLORURINE YELLOW 03/23/2018 1922   APPEARANCEUR CLEAR 03/23/2018 1922   LABSPEC 1.015 03/23/2018 1922   PHURINE 5.0 03/23/2018 1922   GLUCOSEU 50 (A) 03/23/2018 [place] NEGATIVE 03/23/2018 [place] NEGATIVE 03/23/2018 [place] NEGATIVE 03/23/2018 1922   PROTEINUR 30 (A) 03/23/2018 1922   UROBILINOGEN 4.0 (H) 07/29/2013 1231   NITRITE NEGATIVE 03/23/2018 1922   LEUKOCYTESUR NEGATIVE 03/23/2018 1922   Sepsis Labs Invalid input(s): PROCALCITONIN,  WBC,  LACTICIDVEN Microbiology No results found for this or any previous visit (from the past 240 hour(s)).   Time coordinating discharge: 45  minutes      SIGNED:   Edwin Dada, MD  [facility] 04/04/2018, 5:03 PM

## 2018-04-06 ENCOUNTER — Other Ambulatory Visit: Payer: Self-pay

## 2018-04-06 ENCOUNTER — Ambulatory Visit: Payer: Self-pay

## 2018-04-06 ENCOUNTER — Telehealth: Payer: Self-pay

## 2018-04-06 ENCOUNTER — Encounter: Payer: Self-pay | Admitting: Family Medicine

## 2018-04-06 ENCOUNTER — Ambulatory Visit (INDEPENDENT_AMBULATORY_CARE_PROVIDER_SITE_OTHER): Payer: Medicare HMO | Admitting: Family Medicine

## 2018-04-06 VITALS — Temp 99.6°F | Ht 67.32 in | Wt 252.0 lb

## 2018-04-06 DIAGNOSIS — E669 Obesity, unspecified: Secondary | ICD-10-CM

## 2018-04-06 DIAGNOSIS — M1990 Unspecified osteoarthritis, unspecified site: Secondary | ICD-10-CM | POA: Diagnosis not present

## 2018-04-06 DIAGNOSIS — N182 Chronic kidney disease, stage 2 (mild): Secondary | ICD-10-CM | POA: Diagnosis not present

## 2018-04-06 DIAGNOSIS — R9431 Abnormal electrocardiogram [ECG] [EKG]: Secondary | ICD-10-CM

## 2018-04-06 DIAGNOSIS — I639 Cerebral infarction, unspecified: Secondary | ICD-10-CM | POA: Diagnosis not present

## 2018-04-06 DIAGNOSIS — Z8673 Personal history of transient ischemic attack (TIA), and cerebral infarction without residual deficits: Secondary | ICD-10-CM | POA: Diagnosis not present

## 2018-04-06 DIAGNOSIS — R55 Syncope and collapse: Secondary | ICD-10-CM | POA: Diagnosis not present

## 2018-04-06 DIAGNOSIS — E1169 Type 2 diabetes mellitus with other specified complication: Secondary | ICD-10-CM | POA: Diagnosis not present

## 2018-04-06 DIAGNOSIS — I160 Hypertensive urgency: Secondary | ICD-10-CM | POA: Diagnosis not present

## 2018-04-06 DIAGNOSIS — E1151 Type 2 diabetes mellitus with diabetic peripheral angiopathy without gangrene: Secondary | ICD-10-CM | POA: Diagnosis not present

## 2018-04-06 DIAGNOSIS — I10 Essential (primary) hypertension: Secondary | ICD-10-CM | POA: Diagnosis not present

## 2018-04-06 DIAGNOSIS — I509 Heart failure, unspecified: Secondary | ICD-10-CM | POA: Diagnosis not present

## 2018-04-06 DIAGNOSIS — F329 Major depressive disorder, single episode, unspecified: Secondary | ICD-10-CM | POA: Diagnosis not present

## 2018-04-06 DIAGNOSIS — I5030 Unspecified diastolic (congestive) heart failure: Secondary | ICD-10-CM | POA: Diagnosis not present

## 2018-04-06 DIAGNOSIS — I130 Hypertensive heart and chronic kidney disease with heart failure and stage 1 through stage 4 chronic kidney disease, or unspecified chronic kidney disease: Secondary | ICD-10-CM | POA: Diagnosis not present

## 2018-04-06 DIAGNOSIS — E1122 Type 2 diabetes mellitus with diabetic chronic kidney disease: Secondary | ICD-10-CM | POA: Diagnosis not present

## 2018-04-06 MED ORDER — ATORVASTATIN CALCIUM 40 MG PO TABS: 40.0000 mg | ORAL_TABLET | Freq: Every day | ORAL | 0 refills | Status: DC

## 2018-04-06 MED ORDER — NIFEDIPINE ER 60 MG PO TB24: 60.0000 mg | ORAL_TABLET | Freq: Every day | ORAL | 2 refills | Status: DC

## 2018-04-06 MED ORDER — BLOOD GLUCOSE MONITOR KIT: PACK | 0 refills | Status: DC

## 2018-04-06 MED ORDER — CLOPIDOGREL BISULFATE 75 MG PO TABS: 75.0000 mg | ORAL_TABLET | Freq: Every day | ORAL | 0 refills | Status: DC

## 2018-04-06 MED ORDER — METFORMIN HCL 1000 MG PO TABS: 1000.0000 mg | ORAL_TABLET | Freq: Two times a day (BID) | ORAL | 2 refills | Status: DC

## 2018-04-06 MED ORDER — ASPIRIN 81 MG PO TBEC: 81.0000 mg | DELAYED_RELEASE_TABLET | Freq: Every day | ORAL | 0 refills | Status: DC

## 2018-04-06 MED ORDER — ENALAPRIL MALEATE 20 MG PO TABS: 20.0000 mg | ORAL_TABLET | Freq: Two times a day (BID) | ORAL | 2 refills | Status: DC

## 2018-04-06 MED ORDER — CLOPIDOGREL BISULFATE 75 MG PO TABS: 75.0000 mg | ORAL_TABLET | Freq: Every day | ORAL | 2 refills | Status: DC

## 2018-04-06 MED ORDER — LABETALOL HCL 100 MG PO TABS: 100.0000 mg | ORAL_TABLET | Freq: Two times a day (BID) | ORAL | 2 refills | Status: DC

## 2018-04-06 MED ORDER — ATORVASTATIN CALCIUM 40 MG PO TABS: 40.0000 mg | ORAL_TABLET | Freq: Every day | ORAL | 11 refills | Status: DC

## 2018-04-06 MED ORDER — HYDROCHLOROTHIAZIDE 25 MG PO TABS: 25.0000 mg | ORAL_TABLET | Freq: Every day | ORAL | 2 refills | Status: DC

## 2018-04-06 NOTE — Telephone Encounter (Signed)
Faxed rx to [facility]

## 2018-04-06 NOTE — Patient Instructions (Signed)
     IF you received an x-ray today, you will receive an invoice from [facility]. Please contact [facility] at [phone] with questions or concerns regarding your invoice.   IF you received labwork today, you will receive an invoice from [facility]. Please contact [facility] at [phone] with questions or concerns regarding your invoice.   Our billing staff will not be able to assist you with questions regarding bills from these companies.  You will be contacted with the lab results as soon as they are available. The fastest way to get your results is to activate your My Chart account. Instructions are located on the last page of this paperwork. If you have not heard from us regarding the results in 2 weeks, please contact this office.     

## 2018-04-06 NOTE — Progress Notes (Signed)
5/17/20193:26 PM  Alicia [name] 1951/06/26, 67 y.o. female [id number]  Chief Complaint  Patient presents with  . Establish Care    recently released from ER, looking to est care    HPI:   Patient is a 67 y.o. female with past medical history significant for DM2, HTN, HLP, PAD w hx L fem-pop bypass, CVA in April 2019, [facility], smoker who presents today for ER followup/establish care  04/03/18 ER orthostatic syncope Patient reports sense of lightheaded, imbalance and general weakness for past several months She reports she has been participating in a "clinical trial for BP medication", though she states that she has not seen cards in a while Checks BP at home, reports readings SBP 150-170, labelatol decreased from 252m to 1055mBID in the hosp She only checks cbg with sx, last time she checked it was 186 couple days ago She has an appt with neuro on the 28th  [place] patient lives alone, she does not have an EMS alert system Advanced home health involved Using a cane She has been cutting back on smoking, down to 1/2ppd Diet very high on processed foods  BMP 04/01/18 K 3.2 crt 0.94  April 2019 a1c 7.7 LDL 112  EKG with QT 505  Echo done April 2019 Study Conclusions - Left ventricle: The cavity size was normal. Wall thickness was   increased in a pattern of mild LVH. Systolic function was normal.   The estimated ejection fraction was in the range of 55% to 60%.   Wall motion was normal; there were no regional wall motion   abnormalities. Doppler parameters are consistent with abnormal   left ventricular relaxation (grade 1 diastolic dysfunction). - Aortic valve: There was mild regurgitation. Valve area (VTI):   2.24 cm^2. Valve area (Vmax): 2.17 cm^2. Valve area (Vmean): 2.11   cm^2. - Right atrium: The atrium was moderately dilated.  Fall Risk  04/06/2018  Falls in the past year? No     Depression screen PHQ 2/9  04/06/2018  Decreased Interest 0  Down, Depressed, Hopeless 0  PHQ - 2 Score 0    Allergies  Allergen Reactions  . Nifedipine Er Other (See Comments)    Caused nose bleeds in higher doses (90 mg., namely)  . Latex Rash    Prior to Admission medications   Medication Sig Start Date End Date Taking? Authorizing Provider  acetaminophen (TYLENOL) 500 MG tablet Take 500-1,000 mg by mouth every 6 (six) hours as needed (for pain).    [provider]  aspirin EC 325 MG EC tablet Take 1 tablet (325 mg total) by mouth daily. 03/19/18   AdShelly CossMD  atorvastatin (LIPITOR) 40 MG tablet Take 1 tablet (40 mg total) by mouth daily at 6 PM. 03/18/18   AdShelly CossMD  benzonatate (TESSALON) 200 MG capsule Take 1 capsule (200 mg total) by mouth every 8 (eight) hours. 03/27/18   Wieters, Hallie C, PA-C  clopidogrel (PLAVIX) 75 MG tablet Take 1 tablet (75 mg total) by mouth daily. 03/19/18   AdShelly CossMD  enalapril (VASOTEC) 20 MG tablet Take 20 mg by mouth 2 (two) times daily.  05/24/14   [provider]  hydrochlorothiazide (HYDRODIURIL) 25 MG tablet Take 25 mg by mouth daily.  01/26/18   [provider]  labetalol (NORMODYNE) 100 MG tablet Take 1 tablets (100 mg total) by mouth 2 (two) times daily. 03/27/18 04/26/18  Wieters, HaElesa Hacker  PA-C  metFORMIN (GLUCOPHAGE) 1000 MG tablet Take 1 tablet (1,000 mg total) by mouth 2 (two) times daily with a meal. 03/18/18   Alicia Coss, MD  NIFEdipine (PROCARDIA-XL/ADALAT CC) 60 MG 24 hr tablet Take 60 mg by mouth daily.    [provider]    Past Medical History:  Diagnosis Date  . Arthritis   . Chronic headaches   . Constipation   . Depression   . Diabetes mellitus   . Hyperlipidemia   . Hypertension   . Obesity   . PAD (peripheral artery disease) ([place])   . Shortness of breath   . Stroke [facility])     Past Surgical History:  Procedure Laterality Date  . FEMORAL BYPASS  02/23/2011   Left Common Femoral to  Below-knee popliteasl BPG   by Dr. Bridgett Larsson    Social History   Tobacco Use  . Smoking status: Light Tobacco Smoker    Packs/day: 0.50    Years: 35.00    Pack years: 17.50    Types: Cigarettes  . Smokeless tobacco: Never Used  . Tobacco comment: patient is trying to quit and has gone down to half a pack per day  Substance Use Topics  . Alcohol use: Yes    Alcohol/week: 0.5 oz    Types: 1 Standard drinks or equivalent per week    Comment: occasional use only    Family History  Problem Relation Age of Onset  . Diabetes Father   . Heart disease Mother        NOT before age 29-  Bypass  . Hypertension Mother   . Hypertension Sister   . Varicose Veins Brother   . Heart disease Brother        Before age 56  . Hypertension Daughter     Review of Systems  Constitutional: Positive for malaise/fatigue and weight loss. Negative for chills and fever.  Eyes: Negative for blurred vision and double vision.  Respiratory: Negative for cough and shortness of breath.   Cardiovascular: Negative for chest pain, palpitations and leg swelling.  Gastrointestinal: Negative for abdominal pain, nausea and vomiting.  Musculoskeletal: Positive for falls.  Neurological: Positive for dizziness and loss of consciousness. Negative for sensory change, speech change and focal weakness.  Endo/Heme/Allergies: Negative for polydipsia.  All other systems reviewed and are negative.    OBJECTIVE:  Blood pressure (!) 152/64, pulse 100, temperature 99.6 F (37.6 C), temperature source Oral, height 5' 7.32" (1.71 m), weight 252 lb (114.3 kg), SpO2 96 %.  orthosttics: Lying: 156/80, 98 Standing @ 70mn: 162/98, HR 96 Standing @ 316m: 148/72, HR 95  Wt Readings from Last 3 Encounters:  04/06/18 252 lb (114.3 kg)  04/04/18 254 lb 13.6 oz (115.6 kg)  03/23/18 261 lb 14.5 oz (118.8 kg)  became very dizzy with standing  Physical Exam  Constitutional: She is oriented to person, place, and time. She appears  well-developed and well-nourished.  HENT:  Head: Normocephalic and atraumatic.  Right Ear: Hearing, tympanic membrane, external ear and ear canal normal.  Left Ear: Hearing, tympanic membrane, external ear and ear canal normal.  Mouth/Throat: Oropharynx is clear and moist.  Eyes: Pupils are equal, round, and reactive to light. Conjunctivae and EOM are normal.  Neck: Neck supple. No thyromegaly present.  Cardiovascular: Normal rate, regular rhythm and normal heart sounds. Exam reveals no gallop and no friction rub.  No murmur heard. Pulmonary/Chest: Effort normal and breath sounds normal. She has no wheezes. She has no rales.  Abdominal: Soft. Bowel sounds are normal. She exhibits no distension and no mass. There is no tenderness.  Musculoskeletal: Normal range of motion. She exhibits no edema.  Lymphadenopathy:    She has no cervical adenopathy.  Neurological: She is alert and oriented to person, place, and time. She has normal reflexes. No cranial nerve deficit. Gait normal.  Skin: Skin is warm and dry.  Psychiatric: She has a normal mood and affect.  Nursing note and vitals reviewed.   ASSESSMENT and PLAN  1. Essential hypertension, benign Above goal but patient very symptomatic with postural changes, labetalol just recently decreased. Diet very high in salt. Focus on sign salt reduction. Cont home monitoring. Consider adding spironolactone. Does she have OSA given dilated RA?   2. Cerebrovascular accident (CVA), unspecified mechanism ([place]) Atorvastatin started a month ago, previous myalgia on crestor. Tolerating current statin. Recheck with next labs. LDL goal < 70. Has upcoming appt with neuro.  3. Diabetes mellitus type 2 in obese ([place]) Slightly above goal. Again will focus on LFM. Recheck a1c in 3 months. Consider adding a second agent, maybe SGLT2 if ok crt given HTN/CHF. - Comprehensive metabolic panel  4. Syncope, unspecified syncope type Since her CVA. Home health PT  working on balance. Safety precautions given.  Hand written orders for raised toilet seat, shower chair and grab bars faxed to [facility]  Other orders - enalapril (VASOTEC) 20 MG tablet; Take 1 tablet (20 mg total) by mouth 2 (two) times daily. - aspirin 81 MG EC tablet; Take 1 tablet (81 mg total) by mouth daily. - hydrochlorothiazide (HYDRODIURIL) 25 MG tablet; Take 1 tablet (25 mg total) by mouth daily. - labetalol (NORMODYNE) 100 MG tablet; Take 1 tablet (100 mg total) by mouth 2 (two) times daily. - metFORMIN (GLUCOPHAGE) 1000 MG tablet; Take 1 tablet (1,000 mg total) by mouth 2 (two) times daily with a meal. - NIFEdipine (PROCARDIA-XL/ADALAT CC) 60 MG 24 hr tablet; Take 1 tablet (60 mg total) by mouth daily. - clopidogrel (PLAVIX) 75 MG tablet; Take 1 tablet (75 mg total) by mouth daily. - atorvastatin (LIPITOR) 40 MG tablet; Take 1 tablet (40 mg total) by mouth daily at 6 PM. - blood glucose meter kit and supplies KIT; Per insurance preference. Check glucose once a day. Dx E11.65  Return in about 2 weeks (around 04/20/2018).    Rutherford Guys, MD [facility] [place], [place] [address] Ph.  [phone] Fax [phone]

## 2018-04-06 NOTE — Telephone Encounter (Signed)
Angel with [facility] called to report pt.'s BP is 170/85 "and she is still dizzy. Has had this dizziness x 1 week." Discharged from the hospital this week.Denies chest pain, shortness of breath, any swelling. Angel requesting pt. Be seen sooner than her 2:00 p.m. Appointment today. Spoke with Larene Beach, Flow Coordinator, and there is no availability sooner. Instructed if pt. Has chest pain,shortness of breath, increased dizziness to call 911. Verbalizes understanding.

## 2018-04-07 DIAGNOSIS — I509 Heart failure, unspecified: Secondary | ICD-10-CM | POA: Diagnosis not present

## 2018-04-07 DIAGNOSIS — M1990 Unspecified osteoarthritis, unspecified site: Secondary | ICD-10-CM | POA: Diagnosis not present

## 2018-04-07 DIAGNOSIS — E1151 Type 2 diabetes mellitus with diabetic peripheral angiopathy without gangrene: Secondary | ICD-10-CM | POA: Diagnosis not present

## 2018-04-07 DIAGNOSIS — E1122 Type 2 diabetes mellitus with diabetic chronic kidney disease: Secondary | ICD-10-CM | POA: Diagnosis not present

## 2018-04-07 DIAGNOSIS — Z8673 Personal history of transient ischemic attack (TIA), and cerebral infarction without residual deficits: Secondary | ICD-10-CM | POA: Diagnosis not present

## 2018-04-07 DIAGNOSIS — I130 Hypertensive heart and chronic kidney disease with heart failure and stage 1 through stage 4 chronic kidney disease, or unspecified chronic kidney disease: Secondary | ICD-10-CM | POA: Diagnosis not present

## 2018-04-07 DIAGNOSIS — F329 Major depressive disorder, single episode, unspecified: Secondary | ICD-10-CM | POA: Diagnosis not present

## 2018-04-07 DIAGNOSIS — N182 Chronic kidney disease, stage 2 (mild): Secondary | ICD-10-CM | POA: Diagnosis not present

## 2018-04-07 DIAGNOSIS — I160 Hypertensive urgency: Secondary | ICD-10-CM | POA: Diagnosis not present

## 2018-04-07 LAB — COMPREHENSIVE METABOLIC PANEL
ALT: 8 IU/L (ref 0–32)
AST: 8 IU/L (ref 0–40)
Albumin/Globulin Ratio: 1.1 — ABNORMAL LOW (ref 1.2–2.2)
Albumin: 3.9 g/dL (ref 3.6–4.8)
Alkaline Phosphatase: 102 IU/L (ref 39–117)
BUN/Creatinine Ratio: 18 (ref 12–28)
BUN: 20 mg/dL (ref 8–27)
Bilirubin Total: 0.5 mg/dL (ref 0.0–1.2)
CO2: 25 mmol/L (ref 20–29)
Calcium: 9.5 mg/dL (ref 8.7–10.3)
Chloride: 99 mmol/L (ref 96–106)
Creatinine, Ser: 1.13 mg/dL — ABNORMAL HIGH (ref 0.57–1.00)
GFR calc Af Amer: 58 mL/min/{1.73_m2} — ABNORMAL LOW (ref >59)
GFR calc non Af Amer: 50 mL/min/{1.73_m2} — ABNORMAL LOW (ref >59)
Globulin, Total: 3.4 g/dL (ref 1.5–4.5)
Glucose: 162 mg/dL — ABNORMAL HIGH (ref 65–99)
Potassium: 3.9 mmol/L (ref 3.5–5.2)
Sodium: 141 mmol/L (ref 134–144)
Total Protein: 7.3 g/dL (ref 6.0–8.5)

## 2018-04-07 NOTE — Telephone Encounter (Signed)
Pt was seen 04/06/2018

## 2018-04-09 ENCOUNTER — Telehealth: Payer: Self-pay | Admitting: Family Medicine

## 2018-04-09 DIAGNOSIS — I509 Heart failure, unspecified: Secondary | ICD-10-CM | POA: Diagnosis not present

## 2018-04-09 DIAGNOSIS — I160 Hypertensive urgency: Secondary | ICD-10-CM | POA: Diagnosis not present

## 2018-04-09 DIAGNOSIS — F329 Major depressive disorder, single episode, unspecified: Secondary | ICD-10-CM | POA: Diagnosis not present

## 2018-04-09 DIAGNOSIS — E1151 Type 2 diabetes mellitus with diabetic peripheral angiopathy without gangrene: Secondary | ICD-10-CM | POA: Diagnosis not present

## 2018-04-09 DIAGNOSIS — I130 Hypertensive heart and chronic kidney disease with heart failure and stage 1 through stage 4 chronic kidney disease, or unspecified chronic kidney disease: Secondary | ICD-10-CM | POA: Diagnosis not present

## 2018-04-09 DIAGNOSIS — N182 Chronic kidney disease, stage 2 (mild): Secondary | ICD-10-CM | POA: Diagnosis not present

## 2018-04-09 DIAGNOSIS — E1122 Type 2 diabetes mellitus with diabetic chronic kidney disease: Secondary | ICD-10-CM | POA: Diagnosis not present

## 2018-04-09 DIAGNOSIS — Z8673 Personal history of transient ischemic attack (TIA), and cerebral infarction without residual deficits: Secondary | ICD-10-CM | POA: Diagnosis not present

## 2018-04-09 DIAGNOSIS — M1990 Unspecified osteoarthritis, unspecified site: Secondary | ICD-10-CM | POA: Diagnosis not present

## 2018-04-09 NOTE — Telephone Encounter (Signed)
Copied from [place] [id number]. Topic: General - Other >> Apr 09, 2018 10:06 AM Yvette Rack wrote: Reason for CRM: Social Worker Lucinda from [facility] [phone] calling for verbal orders for  Social Work evaluation

## 2018-04-09 NOTE — Telephone Encounter (Signed)
Phone call to [place], verbal orders given for social work. She verbalizes understanding.

## 2018-04-11 ENCOUNTER — Ambulatory Visit: Payer: Self-pay | Admitting: *Deleted

## 2018-04-11 DIAGNOSIS — I160 Hypertensive urgency: Secondary | ICD-10-CM | POA: Diagnosis not present

## 2018-04-11 DIAGNOSIS — N182 Chronic kidney disease, stage 2 (mild): Secondary | ICD-10-CM | POA: Diagnosis not present

## 2018-04-11 DIAGNOSIS — E1151 Type 2 diabetes mellitus with diabetic peripheral angiopathy without gangrene: Secondary | ICD-10-CM | POA: Diagnosis not present

## 2018-04-11 DIAGNOSIS — Z8673 Personal history of transient ischemic attack (TIA), and cerebral infarction without residual deficits: Secondary | ICD-10-CM | POA: Diagnosis not present

## 2018-04-11 DIAGNOSIS — F329 Major depressive disorder, single episode, unspecified: Secondary | ICD-10-CM | POA: Diagnosis not present

## 2018-04-11 DIAGNOSIS — E1122 Type 2 diabetes mellitus with diabetic chronic kidney disease: Secondary | ICD-10-CM | POA: Diagnosis not present

## 2018-04-11 DIAGNOSIS — M1990 Unspecified osteoarthritis, unspecified site: Secondary | ICD-10-CM | POA: Diagnosis not present

## 2018-04-11 DIAGNOSIS — I130 Hypertensive heart and chronic kidney disease with heart failure and stage 1 through stage 4 chronic kidney disease, or unspecified chronic kidney disease: Secondary | ICD-10-CM | POA: Diagnosis not present

## 2018-04-11 DIAGNOSIS — I509 Heart failure, unspecified: Secondary | ICD-10-CM | POA: Diagnosis not present

## 2018-04-11 NOTE — Telephone Encounter (Signed)
Alicia Beady, RN from [facility] called, stating the pt's BP 190/80 at 1220 and 178/90 at 1230; these values were taken on her left arm sitting/at rest; she says the pt is having an intermittent "prickly type pain in her chest that feels like gas"; she states that the BP medication was labetalol which was decreased to 100 mg twice daily due to dizziness; recommendations made per nurse triage protocol to go to include going to ED; pt refuses disposition; spoke with Rudene Christians who agrees with disposition per nurse triage; Glenard Haring will encourage pt to call 911 if she gets worse; will route to office per [facility] for provider notification.    Reason for Disposition . [3] Systolic BP  >= 646 OR Diastolic >= 803 AND [2] cardiac or neurologic symptoms (e.g., chest pain, difficulty breathing, unsteady gait, blurred vision)  Answer Assessment - Initial Assessment Questions 1. BLOOD PRESSURE: "What is the blood pressure?" "Did you take at least two measurements 5 minutes apart?"     190/80 and 178/90 2. ONSET: "When did you take your blood pressure?"    Today at 122o and 1230 3. HOW: "How did you obtain the blood pressure?" (e.g., visiting nurse, automatic home BP monitor)     Home health RN manual cuff on left arm  4. HISTORY: "Do you have a history of high blood pressure?"     yes 5. MEDICATIONS: "Are you taking any medications for blood pressure?" "Have you missed any doses recently?"     labetolol 6. OTHER SYMPTOMS: "Do you have any symptoms?" (e.g., headache, chest pain, blurred vision, difficulty breathing, weakness)    Pain described as "a prickly feeling in her chest (feels like gas)" 7. PREGNANCY: "Is there any chance you are pregnant?" "When was your last menstrual period?"     no  Protocols used: HIGH BLOOD PRESSURE-A-AH

## 2018-04-13 DIAGNOSIS — E1151 Type 2 diabetes mellitus with diabetic peripheral angiopathy without gangrene: Secondary | ICD-10-CM | POA: Diagnosis not present

## 2018-04-13 DIAGNOSIS — I509 Heart failure, unspecified: Secondary | ICD-10-CM | POA: Diagnosis not present

## 2018-04-13 DIAGNOSIS — I160 Hypertensive urgency: Secondary | ICD-10-CM | POA: Diagnosis not present

## 2018-04-13 DIAGNOSIS — Z8673 Personal history of transient ischemic attack (TIA), and cerebral infarction without residual deficits: Secondary | ICD-10-CM | POA: Diagnosis not present

## 2018-04-13 DIAGNOSIS — E1122 Type 2 diabetes mellitus with diabetic chronic kidney disease: Secondary | ICD-10-CM | POA: Diagnosis not present

## 2018-04-13 DIAGNOSIS — M1990 Unspecified osteoarthritis, unspecified site: Secondary | ICD-10-CM | POA: Diagnosis not present

## 2018-04-13 DIAGNOSIS — I130 Hypertensive heart and chronic kidney disease with heart failure and stage 1 through stage 4 chronic kidney disease, or unspecified chronic kidney disease: Secondary | ICD-10-CM | POA: Diagnosis not present

## 2018-04-13 DIAGNOSIS — N182 Chronic kidney disease, stage 2 (mild): Secondary | ICD-10-CM | POA: Diagnosis not present

## 2018-04-13 DIAGNOSIS — F329 Major depressive disorder, single episode, unspecified: Secondary | ICD-10-CM | POA: Diagnosis not present

## 2018-04-17 ENCOUNTER — Encounter: Payer: Self-pay | Admitting: Adult Health

## 2018-04-17 ENCOUNTER — Ambulatory Visit: Payer: Medicare HMO | Admitting: Adult Health

## 2018-04-17 ENCOUNTER — Telehealth: Payer: Self-pay | Admitting: Family Medicine

## 2018-04-17 VITALS — BP 170/90 | HR 86 | Ht 68.0 in | Wt 257.4 lb

## 2018-04-17 DIAGNOSIS — I63511 Cerebral infarction due to unspecified occlusion or stenosis of right middle cerebral artery: Secondary | ICD-10-CM | POA: Diagnosis not present

## 2018-04-17 DIAGNOSIS — I10 Essential (primary) hypertension: Secondary | ICD-10-CM

## 2018-04-17 DIAGNOSIS — E119 Type 2 diabetes mellitus without complications: Secondary | ICD-10-CM

## 2018-04-17 DIAGNOSIS — E785 Hyperlipidemia, unspecified: Secondary | ICD-10-CM | POA: Diagnosis not present

## 2018-04-17 NOTE — Telephone Encounter (Signed)
Phone call to [place], spoke with [place]. Verbal orders for social work follow up visit given.

## 2018-04-17 NOTE — Telephone Encounter (Signed)
Copied from [place] [phone]. Topic: General - Other >> Apr 17, 2018  9:55 AM Margot Ables wrote: Reason for CRM: requesting VO for follow up visit for SW. Secure VM if not able to answer, Please advise.

## 2018-04-17 NOTE — Progress Notes (Signed)
[facility] [address] [place]. [place] [address] [phone]       OFFICE FOLLOW UP NOTE  [name] Date of Birth:  [birth year] Medical Record Number:  [id number]   Reason for Referral:  hospital stroke follow up  CHIEF COMPLAINT:  Chief Complaint  Patient presents with  . New Patient (Initial Visit)    Patient reports that she has good days and bad days.     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 History obtained from patient and chart review. Reviewed all radiology images and labs personally.  [name] is a 67 y.o. female with history of DM, HLD, HTN, obesity and PAD admitted for lightheadedness and generalized weakness for several weeks. No tPA given due to OSW.  MRI brain reviewed and showed right small CR infarct, old right cerebellar, mid pons and left thalamus infarcts.  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  2D echo showed an EF of 55 to 60%.  LDL 112 and A1c 7.7.  Patient was previously on aspirin 81 mg PTA and recommended to be discharged on aspirin 325 mg and Plavix 75 mg for 3 months and then continue Plavix alone due to intracranial stenosis.  Patient was on Crestor 10 mg PTA for cholesterol control and recommended d/c Crestor and start Lipitor 40 mg.  Patient was discharged to [facility] for continuation of therapies.  Patient returns today for follow-up visit.  Patient has continued complaints of dizziness, fatigue and syncopal episodes where she was seen back in the hospital on 03/27/2018 and 04/03/2018.  After fluids were given and blood pressure medications manage patient was discharged home in stable condition.  Patient continues to have complaints at home of dizziness and fatigue and continues to believe these are related to her blood pressure.  Continues to work with  PT/OT at home for balance problems..  Blood pressures initially in this office was 199/89, asymptomatic.  After rechecking BP 170/90.  Patient does check BP at home and states that typically SBP 1 60-1 80.  She is currently compliant with her BP medications in which she is prescribed for them at this time.  States her PCP is currently trying to manage these medications.  Patient continues to take both aspirin and Plavix without side effects of bleeding or bruising.  When discussing stopping aspirin and continuing Plavix as recommended by Dr. Erlinda Hong in the hospital, patient got upset and stated that she was told that she only need to continue on aspirin at urgent care.  Explained to patient that as she was previously on aspirin 81 mg prior to the stroke, it would be highly recommended to continue Plavix for stroke prevention.  She then stated that she was not compliant with aspirin 81 mg and was not taking it regularly.  Patient would like to continue aspirin 81 mg after 3 months of DAPT and stop Plavix.  Recommended possible increase of aspirin to 325 mg but patient refusing this as well and electing to continue on aspirin 81 mg.  Continues to take Lipitor without side effects of myalgias.   ROS:   14 system review of systems performed and negative with exception of fatigue, blurred vision, shortness of breath, snoring, joint pain, runny nose, weakness, dizziness, decreased energy and snoring  PMH:  Past Medical History:  Diagnosis Date  .  Arthritis   . Chronic headaches   . Constipation   . Depression   . Diabetes mellitus   . Hyperlipidemia   . Hypertension   . Obesity   . PAD (peripheral artery disease) ([place])   . Shortness of breath   . Stroke [facility])     PSH:  Past Surgical History:  Procedure Laterality Date  . FEMORAL BYPASS  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 Bridgett Larsson    Social History:  Social History   Socioeconomic History  . Marital status: Single     Spouse name: Not on file  . Number of children: Not on file  . Years of education: Not on file  . Highest education level: Not on file  Occupational History  . Not on file  Social Needs  . Financial resource strain: Not on file  . Food insecurity:    Worry: Not on file    Inability: Not on file  . Transportation needs:    Medical: Not on file    Non-medical: Not on file  Tobacco Use  . Smoking status: Light Tobacco Smoker    Packs/day: 0.50    Years: 35.00    Pack years: 17.50    Types: Cigarettes  . Smokeless tobacco: Never Used  . Tobacco comment: patient is trying to quit and has gone down to half a pack per day  Substance and Sexual Activity  . Alcohol use: Yes    Alcohol/week: 0.5 oz    Types: 1 Standard drinks or equivalent per week    Comment: occasional use only  . Drug use: No  . Sexual activity: Not on file  Lifestyle  . Physical activity:    Days per week: Not on file    Minutes per session: Not on file  . Stress: Not on file  Relationships  . Social connections:    Talks on phone: Not on file    Gets together: Not on file    Attends religious service: Not on file    Active member of club or organization: Not on file    Attends meetings of clubs or organizations: Not on file    Relationship status: Not on file  . Intimate partner violence:    Fear of current or ex partner: Not on file    Emotionally abused: Not on file    Physically abused: Not on file    Forced sexual activity: Not on file  Other Topics Concern  . Not on file  Social History Narrative  . Not on file    Family History:  Family History  Problem Relation Age of Onset  . Diabetes Father   . Heart disease Mother        NOT before age 3-  Bypass  . Hypertension Mother   . Hypertension Sister   . Varicose Veins Brother   . Heart disease Brother        Before age 67  . Hypertension Daughter     Medications:   Current Outpatient Medications on File Prior to Visit    Medication Sig Dispense Refill  . acetaminophen (TYLENOL) 500 MG tablet Take 500-1,000 mg by mouth every 6 (six) hours as needed (for pain).    Marland Kitchen aspirin 81 MG EC tablet Take 1 tablet (81 mg total) by mouth daily. 30 tablet 0  . atorvastatin (LIPITOR) 40 MG tablet Take 1 tablet (40 mg total) by mouth daily at 6 PM. 30 tablet  11  . clopidogrel (PLAVIX) 75 MG tablet Take 1 tablet (75 mg total) by mouth daily. 30 tablet 2  . enalapril (VASOTEC) 20 MG tablet Take 1 tablet (20 mg total) by mouth 2 (two) times daily. 60 tablet 2  . hydrochlorothiazide (HYDRODIURIL) 25 MG tablet Take 1 tablet (25 mg total) by mouth daily. 30 tablet 2  . labetalol (NORMODYNE) 100 MG tablet Take 1 tablet (100 mg total) by mouth 2 (two) times daily. 60 tablet 2  . metFORMIN (GLUCOPHAGE) 1000 MG tablet Take 1 tablet (1,000 mg total) by mouth 2 (two) times daily with a meal. 60 tablet 2  . NIFEdipine (PROCARDIA-XL/ADALAT CC) 60 MG 24 hr tablet Take 1 tablet (60 mg total) by mouth daily. 30 tablet 2   No current facility-administered medications on file prior to visit.     Allergies:   Allergies  Allergen Reactions  . Nifedipine Er Other (See Comments)    Caused nose bleeds in higher doses (90 mg., namely)  . Latex Rash     Physical Exam  Vitals:   04/17/18 1344 04/17/18 1406  BP: (!) 199/89 (!) 170/90  Pulse: 86   Weight: 257 lb 6.4 oz (116.8 kg)   Height: 5\' 8"  (1.727 m)    Body mass index is 39.14 kg/m. No exam data present  General: well developed, obese pleasant African-American female, well nourished, seated, in no evident distress Head: head normocephalic and atraumatic.   Neck: supple with no carotid or supraclavicular bruits Cardiovascular: regular rate and rhythm, no murmurs Musculoskeletal: no deformity Skin:  no rash/petichiae Vascular:  Normal pulses all extremities  Neurologic Exam Mental Status: Awake and fully alert. Oriented to place and time. Recent and remote memory intact.  Attention span, concentration and fund of knowledge appropriate. Mood and affect appropriate.  Cranial Nerves: Fundoscopic exam reveals sharp disc margins. Pupils equal, briskly reactive to light. Extraocular movements full without nystagmus. Visual fields full to confrontation. Hearing intact. Facial sensation intact. Face, tongue, palate moves normally and symmetrically.  Motor: Normal bulk and tone. Normal strength in all tested extremity muscles. Sensory.: intact to touch , pinprick , position and vibratory sensation.  Coordination: Rapid alternating movements normal in all extremities. Finger-to-nose and heel-to-shin performed accurately bilaterally. Gait and Station: Arises from chair without difficulty. Stance is normal. Gait demonstrates wobbling stride with assistance of cane.  Romberg mildly positive.  Refuses tandem gait. Reflexes: 1+ and symmetric. Toes downgoing.    NIHSS  0 Modified Rankin  2    Diagnostic Data (Labs, Imaging, Testing)  MR BRAIN WO CONTRAST 03/15/2018 IMPRESSION: 1. 5 mm acute/early subacute infarction within the right mid corona radiata. No associated hemorrhage or mass effect. 2. Moderate for age chronic microvascular ischemic changes and parenchymal volume loss of the brain. 3. Small chronic infarctions in the right cerebellum, mid pons, and left basal ganglia. 4. Foci of chronic microhemorrhage in central distribution likely related to chronic hypertension.  CT ANGIO NECK W OR WO CONTRAST CT ANGIO HEAD W OR WO CONTRAST CT HEAD WO CONTRAST 03/23/2018 IMPRESSION: 1. No acute intracranial abnormality. 2. Stable remote lacunar infarct of the left thalamus. 3. Stable remote right cerebellar infarcts. 4. Stable chronic white matter disease. 5. ASPECTS is 10/10  ECHOCARDIOGRAM 03/16/2018 Study Conclusions - Left ventricle: The cavity size was normal. Wall thickness was   increased in a pattern of mild LVH. Systolic function was normal.   The  estimated ejection fraction was in the range of 55% to 60%.  Wall motion was normal; there were no regional wall motion   abnormalities. Doppler parameters are consistent with abnormal   left ventricular relaxation (grade 1 diastolic dysfunction). - Aortic valve: There was mild regurgitation. Valve area (VTI):   2.24 cm^2. Valve area (Vmax): 2.17 cm^2. Valve area (Vmean): 2.11   cm^2. - Right atrium: The atrium was moderately dilated.     ASSESSMENT: Alicia [name] is a 67 y.o. year old female here with right CR infarct on 03/15/18 secondary to small vessel disease. Vascular risk factors include HTN, HLD, DM and PAD.    PLAN: -Continue aspirin 325 mg daily and clopidogrel 75 mg daily  and lipitor  for secondary stroke prevention -Stop Plavix and continue aspirin 81 mg after 06/15/18 (total of 3 month DAPT therapy).  Continuing aspirin 81 mg as patient states she was not compliant on this prior to most recent stroke and refusing to continue on Plavix only after 23-month period.  -F/u with PCP regarding your HLD, HTN and DM management -advised patient to speak to PCP regarding blood pressure management as blood pressure continues to be elevated -continue to monitor BP at home -Continue to work with PT/OT -Maintain strict control of hypertension with blood pressure goal below 130/90, diabetes with hemoglobin A1c goal below 6.5% and cholesterol with LDL cholesterol (bad cholesterol) goal below 70 mg/dL. I also advised the patient to eat a healthy diet with plenty of whole grains, cereals, fruits and vegetables, exercise regularly and maintain ideal body weight.  Follow up in 3 months or call earlier if needed   Greater than 50% of time during this 25 minute visit was spent on counseling,explanation of diagnosis of right CR infarct, reviewing risk factor management of HLD, HTN and DM, planning of further management, discussion with patient and family and coordination of care    Venancio Poisson, [facility]  [facility] [address] [place], [place] [address]  Phone [phone] Fax [phone]

## 2018-04-17 NOTE — Patient Instructions (Signed)
Continue aspirin 81 mg daily and clopidogrel 75 mg daily  and lipitor  for secondary stroke prevention  Stop plavix after 06/15/18 and continue aspirin 81mg  only  Continue to follow up with PCP regarding cholesterol, diabetes and blood pressure management - call PCP in regards to increased blood pressure despite 4 different medications for blood pressure control  Continue to work with PT/OT  Continue to monitor blood pressure at home  Maintain strict control of hypertension with blood pressure goal below 130/90, diabetes with hemoglobin A1c goal below 6.5% and cholesterol with LDL cholesterol (bad cholesterol) goal below 70 mg/dL. I also advised the patient to eat a healthy diet with plenty of whole grains, cereals, fruits and vegetables, exercise regularly and maintain ideal body weight.  Followup in the future with me in 3 months or call earlier if needed       Thank you for coming to see [place] at [facility]. I hope we have been able to provide you high quality care today.  You may receive a patient satisfaction survey over the next few weeks. We would appreciate your feedback and comments so that we may continue to improve ourselves and the health of our patients.

## 2018-04-18 DIAGNOSIS — F329 Major depressive disorder, single episode, unspecified: Secondary | ICD-10-CM | POA: Diagnosis not present

## 2018-04-18 DIAGNOSIS — E1122 Type 2 diabetes mellitus with diabetic chronic kidney disease: Secondary | ICD-10-CM | POA: Diagnosis not present

## 2018-04-18 DIAGNOSIS — I509 Heart failure, unspecified: Secondary | ICD-10-CM | POA: Diagnosis not present

## 2018-04-18 DIAGNOSIS — I160 Hypertensive urgency: Secondary | ICD-10-CM | POA: Diagnosis not present

## 2018-04-18 DIAGNOSIS — I130 Hypertensive heart and chronic kidney disease with heart failure and stage 1 through stage 4 chronic kidney disease, or unspecified chronic kidney disease: Secondary | ICD-10-CM | POA: Diagnosis not present

## 2018-04-18 DIAGNOSIS — Z8673 Personal history of transient ischemic attack (TIA), and cerebral infarction without residual deficits: Secondary | ICD-10-CM | POA: Diagnosis not present

## 2018-04-18 DIAGNOSIS — E1151 Type 2 diabetes mellitus with diabetic peripheral angiopathy without gangrene: Secondary | ICD-10-CM | POA: Diagnosis not present

## 2018-04-18 DIAGNOSIS — N182 Chronic kidney disease, stage 2 (mild): Secondary | ICD-10-CM | POA: Diagnosis not present

## 2018-04-18 DIAGNOSIS — M1990 Unspecified osteoarthritis, unspecified site: Secondary | ICD-10-CM | POA: Diagnosis not present

## 2018-04-19 ENCOUNTER — Ambulatory Visit: Payer: Self-pay | Admitting: *Deleted

## 2018-04-19 DIAGNOSIS — M1990 Unspecified osteoarthritis, unspecified site: Secondary | ICD-10-CM | POA: Diagnosis not present

## 2018-04-19 DIAGNOSIS — N182 Chronic kidney disease, stage 2 (mild): Secondary | ICD-10-CM | POA: Diagnosis not present

## 2018-04-19 DIAGNOSIS — F329 Major depressive disorder, single episode, unspecified: Secondary | ICD-10-CM | POA: Diagnosis not present

## 2018-04-19 DIAGNOSIS — I160 Hypertensive urgency: Secondary | ICD-10-CM | POA: Diagnosis not present

## 2018-04-19 DIAGNOSIS — I509 Heart failure, unspecified: Secondary | ICD-10-CM | POA: Diagnosis not present

## 2018-04-19 DIAGNOSIS — Z8673 Personal history of transient ischemic attack (TIA), and cerebral infarction without residual deficits: Secondary | ICD-10-CM | POA: Diagnosis not present

## 2018-04-19 DIAGNOSIS — E1151 Type 2 diabetes mellitus with diabetic peripheral angiopathy without gangrene: Secondary | ICD-10-CM | POA: Diagnosis not present

## 2018-04-19 DIAGNOSIS — E1122 Type 2 diabetes mellitus with diabetic chronic kidney disease: Secondary | ICD-10-CM | POA: Diagnosis not present

## 2018-04-19 DIAGNOSIS — I130 Hypertensive heart and chronic kidney disease with heart failure and stage 1 through stage 4 chronic kidney disease, or unspecified chronic kidney disease: Secondary | ICD-10-CM | POA: Diagnosis not present

## 2018-04-19 NOTE — Telephone Encounter (Signed)
Pt called with an elevated b/p today. States it has been going up more. She has not missed any b/p medications. She states that she feels a little more short of breath and has weakness all the time. She is not having any other cardiac symptoms. She is reluctant to make an appointment because of transportation issues. Appointment made for Saturday. Flow at [facility] notified.  Pt advised that if she starts feeling more symptoms or increased in b/p to call 911. Pt voiced understanding.   Reason for Disposition . Systolic BP  >= 242 OR Diastolic >= 353  Answer Assessment - Initial Assessment Questions 1. BLOOD PRESSURE: "What is the blood pressure?" "Did you take at least two measurements 5 minutes apart?"     180/100 HR 83 and 5 minutes later 192/105 HR 81 2. ONSET: "When did you take your blood pressure?"     now 3. HOW: "How did you obtain the blood pressure?" (e.g., visiting nurse, automatic home BP monitor)     Manual bp cuff by home health nurse 4. HISTORY: "Do you have a history of high blood pressure?"     Yes  5. MEDICATIONS: "Are you taking any medications for blood pressure?" "Have you missed any doses recently?"     Yes, and have not missed any doses recently 6. OTHER SYMPTOMS: "Do you have any symptoms?" (e.g., headache, chest pain, blurred vision, difficulty breathing, weakness)     Difficulty breathing a little bit more today, weakness 7. PREGNANCY: "Is there any chance you are pregnant?" "When was your last menstrual period?"     no  Protocols used: HIGH BLOOD PRESSURE-A-AH

## 2018-04-21 ENCOUNTER — Ambulatory Visit: Payer: Self-pay | Admitting: Family Medicine

## 2018-04-22 NOTE — Progress Notes (Signed)
I reviewed above note and agree with the assessment and plan.  Rosalin Hawking, MD PhD Stroke Neurology 04/22/2018 5:15 PM

## 2018-04-24 DIAGNOSIS — Z8673 Personal history of transient ischemic attack (TIA), and cerebral infarction without residual deficits: Secondary | ICD-10-CM | POA: Diagnosis not present

## 2018-04-24 DIAGNOSIS — I509 Heart failure, unspecified: Secondary | ICD-10-CM | POA: Diagnosis not present

## 2018-04-24 DIAGNOSIS — E1122 Type 2 diabetes mellitus with diabetic chronic kidney disease: Secondary | ICD-10-CM | POA: Diagnosis not present

## 2018-04-24 DIAGNOSIS — I160 Hypertensive urgency: Secondary | ICD-10-CM | POA: Diagnosis not present

## 2018-04-24 DIAGNOSIS — N182 Chronic kidney disease, stage 2 (mild): Secondary | ICD-10-CM | POA: Diagnosis not present

## 2018-04-24 DIAGNOSIS — M1990 Unspecified osteoarthritis, unspecified site: Secondary | ICD-10-CM | POA: Diagnosis not present

## 2018-04-24 DIAGNOSIS — F329 Major depressive disorder, single episode, unspecified: Secondary | ICD-10-CM | POA: Diagnosis not present

## 2018-04-24 DIAGNOSIS — I130 Hypertensive heart and chronic kidney disease with heart failure and stage 1 through stage 4 chronic kidney disease, or unspecified chronic kidney disease: Secondary | ICD-10-CM | POA: Diagnosis not present

## 2018-04-24 DIAGNOSIS — E1151 Type 2 diabetes mellitus with diabetic peripheral angiopathy without gangrene: Secondary | ICD-10-CM | POA: Diagnosis not present

## 2018-04-25 DIAGNOSIS — N182 Chronic kidney disease, stage 2 (mild): Secondary | ICD-10-CM | POA: Diagnosis not present

## 2018-04-25 DIAGNOSIS — I160 Hypertensive urgency: Secondary | ICD-10-CM | POA: Diagnosis not present

## 2018-04-25 DIAGNOSIS — I509 Heart failure, unspecified: Secondary | ICD-10-CM | POA: Diagnosis not present

## 2018-04-25 DIAGNOSIS — I130 Hypertensive heart and chronic kidney disease with heart failure and stage 1 through stage 4 chronic kidney disease, or unspecified chronic kidney disease: Secondary | ICD-10-CM | POA: Diagnosis not present

## 2018-04-25 DIAGNOSIS — E1122 Type 2 diabetes mellitus with diabetic chronic kidney disease: Secondary | ICD-10-CM | POA: Diagnosis not present

## 2018-04-25 DIAGNOSIS — Z8673 Personal history of transient ischemic attack (TIA), and cerebral infarction without residual deficits: Secondary | ICD-10-CM | POA: Diagnosis not present

## 2018-04-25 DIAGNOSIS — E1151 Type 2 diabetes mellitus with diabetic peripheral angiopathy without gangrene: Secondary | ICD-10-CM | POA: Diagnosis not present

## 2018-04-25 DIAGNOSIS — F329 Major depressive disorder, single episode, unspecified: Secondary | ICD-10-CM | POA: Diagnosis not present

## 2018-04-25 DIAGNOSIS — M1990 Unspecified osteoarthritis, unspecified site: Secondary | ICD-10-CM | POA: Diagnosis not present

## 2018-04-26 DIAGNOSIS — E7800 Pure hypercholesterolemia, unspecified: Secondary | ICD-10-CM | POA: Diagnosis not present

## 2018-04-26 DIAGNOSIS — E119 Type 2 diabetes mellitus without complications: Secondary | ICD-10-CM | POA: Diagnosis not present

## 2018-04-26 DIAGNOSIS — I10 Essential (primary) hypertension: Secondary | ICD-10-CM | POA: Diagnosis not present

## 2018-04-26 DIAGNOSIS — I739 Peripheral vascular disease, unspecified: Secondary | ICD-10-CM | POA: Diagnosis not present

## 2018-04-27 ENCOUNTER — Other Ambulatory Visit: Payer: Self-pay | Admitting: Family Medicine

## 2018-04-27 ENCOUNTER — Telehealth: Payer: Self-pay

## 2018-04-27 DIAGNOSIS — I10 Essential (primary) hypertension: Secondary | ICD-10-CM

## 2018-04-27 DIAGNOSIS — I509 Heart failure, unspecified: Secondary | ICD-10-CM | POA: Insufficient documentation

## 2018-04-27 DIAGNOSIS — R9431 Abnormal electrocardiogram [ECG] [EKG]: Secondary | ICD-10-CM | POA: Insufficient documentation

## 2018-04-27 MED ORDER — SPIRONOLACTONE 25 MG PO TABS: 12.5000 mg | ORAL_TABLET | Freq: Every day | ORAL | 2 refills | Status: DC

## 2018-04-27 NOTE — Telephone Encounter (Signed)
-----   Message from Rutherford Guys, MD sent at 04/27/2018  4:11 PM EDT ----- Regarding: please schedule Please let patient know that I see her BP continues to be elevated per home health. I would like to add spironolactone to her BP regime, and see if we can get better control. Spironolactone can increase potassium so we need to be careful and check it often (blood draw). I also want to remind her of cutting back salt. I would like to see her in one week after starting spironolactone. thanks

## 2018-05-01 DIAGNOSIS — I160 Hypertensive urgency: Secondary | ICD-10-CM | POA: Diagnosis not present

## 2018-05-01 DIAGNOSIS — F329 Major depressive disorder, single episode, unspecified: Secondary | ICD-10-CM | POA: Diagnosis not present

## 2018-05-01 DIAGNOSIS — E1151 Type 2 diabetes mellitus with diabetic peripheral angiopathy without gangrene: Secondary | ICD-10-CM | POA: Diagnosis not present

## 2018-05-01 DIAGNOSIS — N182 Chronic kidney disease, stage 2 (mild): Secondary | ICD-10-CM | POA: Diagnosis not present

## 2018-05-01 DIAGNOSIS — I130 Hypertensive heart and chronic kidney disease with heart failure and stage 1 through stage 4 chronic kidney disease, or unspecified chronic kidney disease: Secondary | ICD-10-CM | POA: Diagnosis not present

## 2018-05-01 DIAGNOSIS — E1122 Type 2 diabetes mellitus with diabetic chronic kidney disease: Secondary | ICD-10-CM | POA: Diagnosis not present

## 2018-05-01 DIAGNOSIS — M1990 Unspecified osteoarthritis, unspecified site: Secondary | ICD-10-CM | POA: Diagnosis not present

## 2018-05-01 DIAGNOSIS — I509 Heart failure, unspecified: Secondary | ICD-10-CM | POA: Diagnosis not present

## 2018-05-01 DIAGNOSIS — Z8673 Personal history of transient ischemic attack (TIA), and cerebral infarction without residual deficits: Secondary | ICD-10-CM | POA: Diagnosis not present

## 2018-05-01 NOTE — Telephone Encounter (Signed)
Called pt with no answer and no machine. Crm done.

## 2018-05-01 NOTE — Telephone Encounter (Signed)
Please schedule OV.  

## 2018-05-02 ENCOUNTER — Telehealth: Payer: Self-pay | Admitting: Family Medicine

## 2018-05-02 DIAGNOSIS — Z8673 Personal history of transient ischemic attack (TIA), and cerebral infarction without residual deficits: Secondary | ICD-10-CM | POA: Diagnosis not present

## 2018-05-02 DIAGNOSIS — I160 Hypertensive urgency: Secondary | ICD-10-CM | POA: Diagnosis not present

## 2018-05-02 DIAGNOSIS — N182 Chronic kidney disease, stage 2 (mild): Secondary | ICD-10-CM | POA: Diagnosis not present

## 2018-05-02 DIAGNOSIS — I130 Hypertensive heart and chronic kidney disease with heart failure and stage 1 through stage 4 chronic kidney disease, or unspecified chronic kidney disease: Secondary | ICD-10-CM | POA: Diagnosis not present

## 2018-05-02 DIAGNOSIS — M1990 Unspecified osteoarthritis, unspecified site: Secondary | ICD-10-CM | POA: Diagnosis not present

## 2018-05-02 DIAGNOSIS — I509 Heart failure, unspecified: Secondary | ICD-10-CM | POA: Diagnosis not present

## 2018-05-02 DIAGNOSIS — E1151 Type 2 diabetes mellitus with diabetic peripheral angiopathy without gangrene: Secondary | ICD-10-CM | POA: Diagnosis not present

## 2018-05-02 DIAGNOSIS — F329 Major depressive disorder, single episode, unspecified: Secondary | ICD-10-CM | POA: Diagnosis not present

## 2018-05-02 DIAGNOSIS — E1122 Type 2 diabetes mellitus with diabetic chronic kidney disease: Secondary | ICD-10-CM | POA: Diagnosis not present

## 2018-05-02 NOTE — Telephone Encounter (Signed)
Copied from [place] [id number]. Topic: Quick Communication - See Telephone Encounter >> May 02, 2018 12:13 PM Bea Graff, NT wrote: CRM for notification. See Telephone encounter for: 05/02/18. Apolonio Schneiders with [facility] states that the pt was already on 1 diuretic and now the dr has prescribed 2 and they want to verify the dr wants her to take both before giving this to the pt. CB#: [phone]

## 2018-05-03 NOTE — Telephone Encounter (Signed)
I spoke with RN clarified and confirmed medications. RN tells me that patient is not taking all BP meds as prescribed, specially on days when she needs to leave the house. I have asked RN to help with monitoring BP and meds patient is taking routinely so that we can actually adjust meds appropriately.

## 2018-05-04 ENCOUNTER — Other Ambulatory Visit: Payer: Self-pay

## 2018-05-04 ENCOUNTER — Encounter: Payer: Self-pay | Admitting: Family Medicine

## 2018-05-04 ENCOUNTER — Ambulatory Visit (INDEPENDENT_AMBULATORY_CARE_PROVIDER_SITE_OTHER): Payer: Medicare HMO | Admitting: Family Medicine

## 2018-05-04 VITALS — BP 172/86 | HR 94 | Temp 99.1°F | Ht 67.91 in | Wt 253.0 lb

## 2018-05-04 DIAGNOSIS — R6889 Other general symptoms and signs: Secondary | ICD-10-CM | POA: Diagnosis not present

## 2018-05-04 DIAGNOSIS — E1169 Type 2 diabetes mellitus with other specified complication: Secondary | ICD-10-CM

## 2018-05-04 DIAGNOSIS — I10 Essential (primary) hypertension: Secondary | ICD-10-CM | POA: Diagnosis not present

## 2018-05-04 DIAGNOSIS — I639 Cerebral infarction, unspecified: Secondary | ICD-10-CM | POA: Diagnosis not present

## 2018-05-04 DIAGNOSIS — E669 Obesity, unspecified: Secondary | ICD-10-CM

## 2018-05-04 LAB — GLUCOSE, POCT (MANUAL RESULT ENTRY): POC Glucose: 187 mg/dl — AB (ref 70–99)

## 2018-05-04 MED ORDER — ATORVASTATIN CALCIUM 80 MG PO TABS: 80.0000 mg | ORAL_TABLET | Freq: Every day | ORAL | 3 refills | Status: DC

## 2018-05-04 NOTE — Progress Notes (Signed)
6/14/201911:21 AM  Alicia [name] 16-Jun-1951, 67 y.o. female [id number]  Chief Complaint  Patient presents with  . Follow-up    Diabetes management and hypertention. Just started taking the metoprolol, makes her very fatigue. Wants to discuss medication and side affects    HPI:   Patient is a 67 y.o. female with past medical history significant for DM2, HTN, HLP, PAD w hx L fem-pop bypass, CVA in April 2019, [facility], smoker who presents today for followup  At last visit her BP continued to be elevated, had HH monitor and after persistent elevation, added spironolactone 12.5mg , as she was on max dose ARB, HCTZ, intolerant of higher dose of labetalol or nifedipine. In the meanwhile she has seen cardiology who changed her labetalol to metoprolol succinate, she is currently at 50mg  She feels better but not back to baseline, still feels lightheaded for a couple of hours after she takes her medications in the morning  She is taking every morning: Enalapril 20mg  HCTZ 25mg  nifedepine XL 60mg  Metoprolol XL 50mg  - was instructed by cards to increase to 100mg  if SBP > 160 after a week Spironolactone 12.5mg   She then takes a second enalapril 20mg  in the afternoon  She brought her BP cuff, off 20 DBP from clinic  She sees cards again in about 2 weeks  Patient Care Team: Rutherford Guys, MD as PCP - General (Family Medicine) Nigel Mormon, MD as Consulting Physician (Cardiology)  Fall Risk  04/06/2018  Falls in the past year? No     Depression screen PHQ 2/9 04/06/2018  Decreased Interest 0  Down, Depressed, Hopeless 0  PHQ - 2 Score 0    Allergies  Allergen Reactions  . Nifedipine Er Other (See Comments)    Caused nose bleeds in higher doses (90 mg., namely)  . Latex Rash    Prior to Admission medications   Medication Sig Start Date End Date Taking? Authorizing Provider  acetaminophen (TYLENOL) 500 MG tablet Take 500-1,000 mg by mouth every 6 (six) hours as  needed (for pain).   Yes [provider]  aspirin 81 MG EC tablet Take 1 tablet (81 mg total) by mouth daily. 04/06/18  Yes Rutherford Guys, MD  atorvastatin (LIPITOR) 40 MG tablet Take 1 tablet (40 mg total) by mouth daily at 6 PM. 04/06/18  Yes Rutherford Guys, MD  clopidogrel (PLAVIX) 75 MG tablet Take 1 tablet (75 mg total) by mouth daily. 04/06/18  Yes Rutherford Guys, MD  enalapril (VASOTEC) 20 MG tablet Take 1 tablet (20 mg total) by mouth 2 (two) times daily. 04/06/18  Yes Rutherford Guys, MD  hydrochlorothiazide (HYDRODIURIL) 25 MG tablet Take 1 tablet (25 mg total) by mouth daily. 04/06/18  Yes Rutherford Guys, MD  metFORMIN (GLUCOPHAGE) 1000 MG tablet Take 1 tablet (1,000 mg total) by mouth 2 (two) times daily with a meal. 04/06/18  Yes Rutherford Guys, MD  metoprolol succinate (TOPROL-XL) 50 MG 24 hr tablet TAKE 1 TABLET BY MOUTH ONCE DAILY FOR 7 DAYS IF TOLERATED WELL AND SYSTOLIC BLOOD PRESSURE IS GREATER THAN 160 MMHG INCREASE TO 2 TABLETS ON 04/26/18  Yes [provider]  NIFEdipine (PROCARDIA-XL/ADALAT CC) 60 MG 24 hr tablet Take 1 tablet (60 mg total) by mouth daily. 04/06/18  Yes Rutherford Guys, MD  spironolactone (ALDACTONE) 25 MG tablet Take 0.5 tablets (12.5 mg total) by mouth daily. 04/27/18  Yes Rutherford Guys, MD  labetalol (NORMODYNE) 100 MG tablet Take 1  tablet (100 mg total) by mouth 2 (two) times daily. Patient not taking: Reported on 05/04/2018 04/06/18   Rutherford Guys, MD    Past Medical History:  Diagnosis Date  . Arthritis   . Chronic headaches   . Constipation   . Depression   . Diabetes mellitus   . Hyperlipidemia   . Hypertension   . Obesity   . PAD (peripheral artery disease) ([place])   . Shortness of breath   . Stroke [facility])     Past Surgical History:  Procedure Laterality Date  . FEMORAL BYPASS  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 Bridgett Larsson    Social History   Tobacco Use  . Smoking status: Light  Tobacco Smoker    Packs/day: 0.50    Years: 35.00    Pack years: 17.50    Types: Cigarettes  . Smokeless tobacco: Never Used  . Tobacco comment: patient is trying to quit and has gone down to half a pack per day  Substance Use Topics  . Alcohol use: Yes    Alcohol/week: 0.6 oz    Types: 1 Standard drinks or equivalent per week    Comment: occasional use only    Family History  Problem Relation Age of Onset  . Diabetes Father   . Heart disease Mother        NOT before age 35-  Bypass  . Hypertension Mother   . Hypertension Sister   . Varicose Veins Brother   . Heart disease Brother        Before age 32  . Hypertension Daughter     Review of Systems  Constitutional: Negative for chills and fever.  Respiratory: Negative for cough and shortness of breath.   Cardiovascular: Negative for chest pain, palpitations and leg swelling.  Gastrointestinal: Negative for abdominal pain, nausea and vomiting.  Neurological: Positive for dizziness.    OBJECTIVE:  Blood pressure (!) 158/78, pulse 94, temperature 99.1 F (37.3 C), temperature source Oral, height 5' 7.91" (1.725 m), weight 253 lb (114.8 kg), SpO2 94 %.  BP Readings from Last 3 Encounters:  05/04/18 (!) 172/86  04/17/18 (!) 170/90  04/04/18 (!) 206/68    Physical Exam  Constitutional: She is oriented to person, place, and time. She appears well-developed and well-nourished.  HENT:  Head: Normocephalic and atraumatic.  Mouth/Throat: Oropharynx is clear and moist. No oropharyngeal exudate.  Eyes: Pupils are equal, round, and reactive to light. EOM are normal. No scleral icterus.  Neck: Neck supple.  Cardiovascular: Normal rate, regular rhythm and normal heart sounds. Exam reveals no gallop and no friction rub.  No murmur heard. Pulmonary/Chest: Effort normal and breath sounds normal. She has no wheezes. She has no rales.  Musculoskeletal: She exhibits no edema.  Neurological: She is alert and oriented to person,  place, and time.  Uses a cane  Skin: Skin is warm and dry.  Psychiatric: She has a normal mood and affect.  Nursing note and vitals reviewed.    Results for orders placed or performed in visit on 05/04/18 (from the past 24 hour(s))  POCT glucose (manual entry)     Status: Abnormal   Collection Time: 05/04/18 11:05 AM  Result Value Ref Range   POC Glucose 187 (A) 70 - 99 mg/dl  random  ASSESSMENT and PLAN  1. Essential hypertension, benign Discussed distributing BP meds a bit more evenly, move nifedipine to the evening, to see if lightheadedness that is happening  after taking her morning meds resolves. Discussed importance of BP control in setting of CVA. Continue current regime for now but defer to cards in regards to BP control. Also advised getting new BP cuff, she can bring here or to cards to confirm accuracy. - Basic Metabolic Panel; Future  2. Cerebrovascular accident (CVA), unspecified mechanism ([place]) Recent eval with neurology, plan if for plavix until aug then ASA 81mg . Last LDL goal > 70, therefore increasing atorvastatin to 80mg , recheck lipids at next visit  3. Diabetes mellitus type 2 in obese ([facility]) - POCT glucose (manual entry) Next a1c due end of July  Other orders - metoprolol succinate (TOPROL-XL) 50 MG 24 hr tablet; TAKE 1 TABLET BY MOUTH ONCE DAILY FOR 7 DAYS IF TOLERATED WELL AND SYSTOLIC BLOOD PRESSURE IS GREATER THAN 160 MMHG INCREASE TO 2 TABLETS ON  Return in about 1 month (around 06/01/2018).    Rutherford Guys, MD [facility] at [place], [place] [address] Ph.  [phone] Fax [phone]

## 2018-05-04 NOTE — Patient Instructions (Addendum)
1. Start taking nifedepine in the evening with your second enalapril 2. Please consider getting a new BP cuff

## 2018-05-08 DIAGNOSIS — I509 Heart failure, unspecified: Secondary | ICD-10-CM | POA: Diagnosis not present

## 2018-05-08 DIAGNOSIS — N182 Chronic kidney disease, stage 2 (mild): Secondary | ICD-10-CM | POA: Diagnosis not present

## 2018-05-08 DIAGNOSIS — E1151 Type 2 diabetes mellitus with diabetic peripheral angiopathy without gangrene: Secondary | ICD-10-CM | POA: Diagnosis not present

## 2018-05-08 DIAGNOSIS — Z8673 Personal history of transient ischemic attack (TIA), and cerebral infarction without residual deficits: Secondary | ICD-10-CM | POA: Diagnosis not present

## 2018-05-08 DIAGNOSIS — I160 Hypertensive urgency: Secondary | ICD-10-CM | POA: Diagnosis not present

## 2018-05-08 DIAGNOSIS — M1990 Unspecified osteoarthritis, unspecified site: Secondary | ICD-10-CM | POA: Diagnosis not present

## 2018-05-08 DIAGNOSIS — E1122 Type 2 diabetes mellitus with diabetic chronic kidney disease: Secondary | ICD-10-CM | POA: Diagnosis not present

## 2018-05-08 DIAGNOSIS — F329 Major depressive disorder, single episode, unspecified: Secondary | ICD-10-CM | POA: Diagnosis not present

## 2018-05-08 DIAGNOSIS — I130 Hypertensive heart and chronic kidney disease with heart failure and stage 1 through stage 4 chronic kidney disease, or unspecified chronic kidney disease: Secondary | ICD-10-CM | POA: Diagnosis not present

## 2018-05-10 DIAGNOSIS — F329 Major depressive disorder, single episode, unspecified: Secondary | ICD-10-CM | POA: Diagnosis not present

## 2018-05-10 DIAGNOSIS — N182 Chronic kidney disease, stage 2 (mild): Secondary | ICD-10-CM | POA: Diagnosis not present

## 2018-05-10 DIAGNOSIS — I160 Hypertensive urgency: Secondary | ICD-10-CM | POA: Diagnosis not present

## 2018-05-10 DIAGNOSIS — I509 Heart failure, unspecified: Secondary | ICD-10-CM | POA: Diagnosis not present

## 2018-05-10 DIAGNOSIS — M1990 Unspecified osteoarthritis, unspecified site: Secondary | ICD-10-CM | POA: Diagnosis not present

## 2018-05-10 DIAGNOSIS — Z8673 Personal history of transient ischemic attack (TIA), and cerebral infarction without residual deficits: Secondary | ICD-10-CM | POA: Diagnosis not present

## 2018-05-10 DIAGNOSIS — E1122 Type 2 diabetes mellitus with diabetic chronic kidney disease: Secondary | ICD-10-CM | POA: Diagnosis not present

## 2018-05-10 DIAGNOSIS — E1151 Type 2 diabetes mellitus with diabetic peripheral angiopathy without gangrene: Secondary | ICD-10-CM | POA: Diagnosis not present

## 2018-05-10 DIAGNOSIS — I130 Hypertensive heart and chronic kidney disease with heart failure and stage 1 through stage 4 chronic kidney disease, or unspecified chronic kidney disease: Secondary | ICD-10-CM | POA: Diagnosis not present

## 2018-05-16 ENCOUNTER — Telehealth: Payer: Self-pay | Admitting: Family Medicine

## 2018-05-16 DIAGNOSIS — E1122 Type 2 diabetes mellitus with diabetic chronic kidney disease: Secondary | ICD-10-CM | POA: Diagnosis not present

## 2018-05-16 DIAGNOSIS — M1990 Unspecified osteoarthritis, unspecified site: Secondary | ICD-10-CM | POA: Diagnosis not present

## 2018-05-16 DIAGNOSIS — E1151 Type 2 diabetes mellitus with diabetic peripheral angiopathy without gangrene: Secondary | ICD-10-CM | POA: Diagnosis not present

## 2018-05-16 DIAGNOSIS — N182 Chronic kidney disease, stage 2 (mild): Secondary | ICD-10-CM | POA: Diagnosis not present

## 2018-05-16 DIAGNOSIS — I130 Hypertensive heart and chronic kidney disease with heart failure and stage 1 through stage 4 chronic kidney disease, or unspecified chronic kidney disease: Secondary | ICD-10-CM | POA: Diagnosis not present

## 2018-05-16 DIAGNOSIS — F329 Major depressive disorder, single episode, unspecified: Secondary | ICD-10-CM | POA: Diagnosis not present

## 2018-05-16 DIAGNOSIS — I509 Heart failure, unspecified: Secondary | ICD-10-CM | POA: Diagnosis not present

## 2018-05-16 DIAGNOSIS — I160 Hypertensive urgency: Secondary | ICD-10-CM | POA: Diagnosis not present

## 2018-05-16 DIAGNOSIS — Z8673 Personal history of transient ischemic attack (TIA), and cerebral infarction without residual deficits: Secondary | ICD-10-CM | POA: Diagnosis not present

## 2018-05-16 NOTE — Telephone Encounter (Signed)
Message re: BP elevation sent to Dr. Pamella Pert

## 2018-05-16 NOTE — Telephone Encounter (Signed)
Baker Janus, nurse from [facility] calling to report pt's BP 188/100 and apical pulse-88. Baker Janus states that BP was taken around 11am and then again at 11:20 and it was 186/100. Pt is not currently having symptoms and has taken her BP medications at 9 am. Per notes of OV on 6/14 pt advised to contact cardiologist for BP control. Nurse states she will find number for cardiologist on one of the pt's medication bottles and call the cardiologist to make them aware of BP as well. Pt has appt with cardiologist on tomorrow.

## 2018-05-17 DIAGNOSIS — E119 Type 2 diabetes mellitus without complications: Secondary | ICD-10-CM | POA: Diagnosis not present

## 2018-05-17 DIAGNOSIS — I739 Peripheral vascular disease, unspecified: Secondary | ICD-10-CM | POA: Diagnosis not present

## 2018-05-17 DIAGNOSIS — E782 Mixed hyperlipidemia: Secondary | ICD-10-CM | POA: Diagnosis not present

## 2018-05-17 DIAGNOSIS — I10 Essential (primary) hypertension: Secondary | ICD-10-CM | POA: Diagnosis not present

## 2018-05-17 NOTE — Telephone Encounter (Signed)
Noted. Has appt with cardiologits

## 2018-05-22 DIAGNOSIS — E1122 Type 2 diabetes mellitus with diabetic chronic kidney disease: Secondary | ICD-10-CM | POA: Diagnosis not present

## 2018-05-22 DIAGNOSIS — Z8673 Personal history of transient ischemic attack (TIA), and cerebral infarction without residual deficits: Secondary | ICD-10-CM | POA: Diagnosis not present

## 2018-05-22 DIAGNOSIS — N182 Chronic kidney disease, stage 2 (mild): Secondary | ICD-10-CM | POA: Diagnosis not present

## 2018-05-22 DIAGNOSIS — F329 Major depressive disorder, single episode, unspecified: Secondary | ICD-10-CM | POA: Diagnosis not present

## 2018-05-22 DIAGNOSIS — I160 Hypertensive urgency: Secondary | ICD-10-CM | POA: Diagnosis not present

## 2018-05-22 DIAGNOSIS — I130 Hypertensive heart and chronic kidney disease with heart failure and stage 1 through stage 4 chronic kidney disease, or unspecified chronic kidney disease: Secondary | ICD-10-CM | POA: Diagnosis not present

## 2018-05-22 DIAGNOSIS — E1151 Type 2 diabetes mellitus with diabetic peripheral angiopathy without gangrene: Secondary | ICD-10-CM | POA: Diagnosis not present

## 2018-05-22 DIAGNOSIS — M1990 Unspecified osteoarthritis, unspecified site: Secondary | ICD-10-CM | POA: Diagnosis not present

## 2018-05-22 DIAGNOSIS — I509 Heart failure, unspecified: Secondary | ICD-10-CM | POA: Diagnosis not present

## 2018-05-25 DIAGNOSIS — I160 Hypertensive urgency: Secondary | ICD-10-CM | POA: Diagnosis not present

## 2018-05-25 DIAGNOSIS — M1990 Unspecified osteoarthritis, unspecified site: Secondary | ICD-10-CM | POA: Diagnosis not present

## 2018-05-25 DIAGNOSIS — N182 Chronic kidney disease, stage 2 (mild): Secondary | ICD-10-CM | POA: Diagnosis not present

## 2018-05-25 DIAGNOSIS — E1122 Type 2 diabetes mellitus with diabetic chronic kidney disease: Secondary | ICD-10-CM | POA: Diagnosis not present

## 2018-05-25 DIAGNOSIS — I130 Hypertensive heart and chronic kidney disease with heart failure and stage 1 through stage 4 chronic kidney disease, or unspecified chronic kidney disease: Secondary | ICD-10-CM | POA: Diagnosis not present

## 2018-05-25 DIAGNOSIS — Z8673 Personal history of transient ischemic attack (TIA), and cerebral infarction without residual deficits: Secondary | ICD-10-CM | POA: Diagnosis not present

## 2018-05-25 DIAGNOSIS — I509 Heart failure, unspecified: Secondary | ICD-10-CM | POA: Diagnosis not present

## 2018-05-25 DIAGNOSIS — F329 Major depressive disorder, single episode, unspecified: Secondary | ICD-10-CM | POA: Diagnosis not present

## 2018-05-25 DIAGNOSIS — E1151 Type 2 diabetes mellitus with diabetic peripheral angiopathy without gangrene: Secondary | ICD-10-CM | POA: Diagnosis not present

## 2018-05-28 ENCOUNTER — Telehealth: Payer: Self-pay | Admitting: Family Medicine

## 2018-05-28 ENCOUNTER — Ambulatory Visit (INDEPENDENT_AMBULATORY_CARE_PROVIDER_SITE_OTHER): Payer: Medicare HMO | Admitting: Family Medicine

## 2018-05-28 DIAGNOSIS — I10 Essential (primary) hypertension: Secondary | ICD-10-CM

## 2018-05-28 DIAGNOSIS — I639 Cerebral infarction, unspecified: Secondary | ICD-10-CM

## 2018-05-28 NOTE — Progress Notes (Signed)
Nurse visit for lab visit.

## 2018-05-28 NOTE — Telephone Encounter (Unsigned)
Copied from [place] [id number]. Topic: Quick Communication - See Telephone Encounter >> May 28, 2018  2:41 PM Percell Belt A wrote: CRM for notification. See Telephone encounter for: 05/28/18.  [facility]  [phone] Would to extend Nursing for another 4 week to get bp under control  Pt was instructed to increase her bp when it get high.  Pt told home health nurse that she dont like to take the extra bp med but she stated it makes her dizzy.  She will not take it if she is going out somewhere.  Nurse stated that she is non compliant

## 2018-05-29 LAB — LIPID PANEL
Chol/HDL Ratio: 4.2 ratio (ref 0.0–4.4)
Cholesterol, Total: 175 mg/dL (ref 100–199)
HDL: 42 mg/dL (ref >39)
LDL Calculated: 101 mg/dL — ABNORMAL HIGH (ref 0–99)
Triglycerides: 162 mg/dL — ABNORMAL HIGH (ref 0–149)
VLDL Cholesterol Cal: 32 mg/dL (ref 5–40)

## 2018-05-29 LAB — BASIC METABOLIC PANEL
BUN/Creatinine Ratio: 17 (ref 12–28)
BUN: 20 mg/dL (ref 8–27)
CO2: 25 mmol/L (ref 20–29)
Calcium: 9.5 mg/dL (ref 8.7–10.3)
Chloride: 99 mmol/L (ref 96–106)
Creatinine, Ser: 1.2 mg/dL — ABNORMAL HIGH (ref 0.57–1.00)
GFR calc Af Amer: 54 mL/min/{1.73_m2} — ABNORMAL LOW (ref >59)
GFR calc non Af Amer: 47 mL/min/{1.73_m2} — ABNORMAL LOW (ref >59)
Glucose: 197 mg/dL — ABNORMAL HIGH (ref 65–99)
Potassium: 4.3 mmol/L (ref 3.5–5.2)
Sodium: 140 mmol/L (ref 134–144)

## 2018-05-30 ENCOUNTER — Other Ambulatory Visit: Payer: Self-pay | Admitting: Family Medicine

## 2018-05-30 DIAGNOSIS — N182 Chronic kidney disease, stage 2 (mild): Secondary | ICD-10-CM | POA: Diagnosis not present

## 2018-05-30 DIAGNOSIS — M1990 Unspecified osteoarthritis, unspecified site: Secondary | ICD-10-CM | POA: Diagnosis not present

## 2018-05-30 DIAGNOSIS — F329 Major depressive disorder, single episode, unspecified: Secondary | ICD-10-CM | POA: Diagnosis not present

## 2018-05-30 DIAGNOSIS — E1122 Type 2 diabetes mellitus with diabetic chronic kidney disease: Secondary | ICD-10-CM | POA: Diagnosis not present

## 2018-05-30 DIAGNOSIS — E1151 Type 2 diabetes mellitus with diabetic peripheral angiopathy without gangrene: Secondary | ICD-10-CM | POA: Diagnosis not present

## 2018-05-30 DIAGNOSIS — E785 Hyperlipidemia, unspecified: Secondary | ICD-10-CM | POA: Diagnosis not present

## 2018-05-30 DIAGNOSIS — I130 Hypertensive heart and chronic kidney disease with heart failure and stage 1 through stage 4 chronic kidney disease, or unspecified chronic kidney disease: Secondary | ICD-10-CM | POA: Diagnosis not present

## 2018-05-30 DIAGNOSIS — E669 Obesity, unspecified: Secondary | ICD-10-CM | POA: Diagnosis not present

## 2018-05-30 DIAGNOSIS — I509 Heart failure, unspecified: Secondary | ICD-10-CM | POA: Diagnosis not present

## 2018-05-31 ENCOUNTER — Telehealth: Payer: Self-pay | Admitting: Family Medicine

## 2018-05-31 ENCOUNTER — Telehealth: Payer: Self-pay

## 2018-05-31 NOTE — Telephone Encounter (Signed)
Spoke with Glenard Haring, needing verbal order in order to be able to go to pt home for aid. Pt is not being in compliance with her medication. Pamella Pert is on vacation. Received verbal orders from [place] for permission to extend home care for patient.

## 2018-05-31 NOTE — Telephone Encounter (Signed)
Alicia [name] calling states she really needs theses verbal orders so she can see the patient . Please see below message CB# [phone]

## 2018-05-31 NOTE — Telephone Encounter (Unsigned)
Copied from [place] [id number]. Topic: Quick Communication - See Telephone Encounter >> May 31, 2018  4:17 PM Ivar Drape wrote: CRM for notification. See Telephone encounter for: 05/31/18.

## 2018-06-01 NOTE — Telephone Encounter (Signed)
Please advise 

## 2018-06-03 NOTE — Telephone Encounter (Signed)
verbal orders given for continued nursing care given uncontrolled BP

## 2018-06-03 NOTE — Telephone Encounter (Signed)
Noted, agreed

## 2018-06-03 NOTE — Telephone Encounter (Signed)
LMOVM for [place] with Dr. Ardyth Gal message.

## 2018-06-07 ENCOUNTER — Ambulatory Visit: Payer: Self-pay | Admitting: *Deleted

## 2018-06-07 DIAGNOSIS — F329 Major depressive disorder, single episode, unspecified: Secondary | ICD-10-CM | POA: Diagnosis not present

## 2018-06-07 DIAGNOSIS — E785 Hyperlipidemia, unspecified: Secondary | ICD-10-CM | POA: Diagnosis not present

## 2018-06-07 DIAGNOSIS — M1990 Unspecified osteoarthritis, unspecified site: Secondary | ICD-10-CM | POA: Diagnosis not present

## 2018-06-07 DIAGNOSIS — E669 Obesity, unspecified: Secondary | ICD-10-CM | POA: Diagnosis not present

## 2018-06-07 DIAGNOSIS — N182 Chronic kidney disease, stage 2 (mild): Secondary | ICD-10-CM | POA: Diagnosis not present

## 2018-06-07 DIAGNOSIS — E1151 Type 2 diabetes mellitus with diabetic peripheral angiopathy without gangrene: Secondary | ICD-10-CM | POA: Diagnosis not present

## 2018-06-07 DIAGNOSIS — I130 Hypertensive heart and chronic kidney disease with heart failure and stage 1 through stage 4 chronic kidney disease, or unspecified chronic kidney disease: Secondary | ICD-10-CM | POA: Diagnosis not present

## 2018-06-07 DIAGNOSIS — I509 Heart failure, unspecified: Secondary | ICD-10-CM | POA: Diagnosis not present

## 2018-06-07 DIAGNOSIS — E1122 Type 2 diabetes mellitus with diabetic chronic kidney disease: Secondary | ICD-10-CM | POA: Diagnosis not present

## 2018-06-07 NOTE — Telephone Encounter (Signed)
Alicia [name], with [facility] calling to report that pt has a manual BP of 210/102 and P-102. Pt also having complaints of head neck and jaw pain. Home Health nurse reports  That the pt has taken her BP meds today and states that the pt reported that she was told to stop taking Metoprolol due to her kidney function. Pt states she has been taking Metoprolol and has not been able to tolerate taking labetelol. Pt advised to go to the ED but pt refuses and states she will keep her appt previously scheduled for tomorrow with Dr. Pamella Pert. Reason for Disposition . [3] Systolic BP  >= 267 OR Diastolic >= 124  AND [5] having NO cardiac or neurologic symptoms  Answer Assessment - Initial Assessment Questions 1. BLOOD PRESSURE: "What is the blood pressure?" "Did you take at least two measurements 5 minutes apart?"     210/102 2. ONSET: "When did you take your blood pressure?"     Prior to calling office 3. HOW: "How did you obtain the blood pressure?" (e.g., visiting nurse, automatic home BP monitor)     Home Health nurse took manual BP 4. HISTORY: "Do you have a history of high blood pressure?"     Yes 5. MEDICATIONS: "Are you taking any medications for blood pressure?" "Have you missed any doses recently?"     Yes is currently taking BP meds and has not missed a dose 6. OTHER SYMPTOMS: "Do you have any symptoms?" (e.g., headache, chest pain, blurred vision, difficulty breathing, weakness)     Headache, neck and jaw pain 7. PREGNANCY: "Is there any chance you are pregnant?" "When was your last menstrual period?"     n/a  Protocols used: HIGH BLOOD PRESSURE-A-AH

## 2018-06-08 ENCOUNTER — Ambulatory Visit: Payer: Medicare HMO | Admitting: Family Medicine

## 2018-06-08 ENCOUNTER — Ambulatory Visit: Payer: Self-pay | Admitting: *Deleted

## 2018-06-08 ENCOUNTER — Telehealth: Payer: Self-pay | Admitting: Family Medicine

## 2018-06-08 NOTE — Telephone Encounter (Signed)
See triage encounter from 06/08/18

## 2018-06-08 NOTE — Telephone Encounter (Signed)
Copied from [place] [id number]. Topic: General - Other >> Jun 08, 2018 12:46 PM Mcneil, Ja-Kwan wrote: Reason for CRM: Pt states her blood pressure reading is 170/97 and the symptoms are getting better because she has taken her medication. Pt requests that a nurse returns her call. Cb# [phone]

## 2018-06-08 NOTE — Telephone Encounter (Signed)
Pt called to cancel appt with Dr. Pamella Pert. Agent put through to NT when pt reported her BP this am 199/115. [place] nurse called yesterday to report BP elevated.  Pt states today she had "Medication mix up." Has not taken Toprol for 4-5 days.  Pt states "I'm going to get it now." States "No reason to come in now, I need to take my medications for a few days and then I'll come in."  Reports "Mild" headache, lightheadedness, "For a few weeks." Pt was directed to ED, refuses disposition. Encouraged pt to a least keep appt at 1100 today, refuses, "They'll just send me to the ED." Reiterated need to go to ED. Continues to refuse. Pt states she will CB to report BP after taking medications. Instructed pt to go to ED, call 911 if any one sided weakness, numbness, tingling occurs. Any dysphagia, aphasia, severe headache, CP occurs.  Spoke with Larene Beach at practice; made aware of refusal of ED disposition.  Reason for Disposition . [0] Systolic BP  >= 981 OR Diastolic >= 191 AND [4] cardiac or neurologic symptoms (e.g., chest pain, difficulty breathing, unsteady gait, blurred vision)  Answer Assessment - Initial Assessment Questions 1. BLOOD PRESSURE: "What is the blood pressure?" "Did you take at least two measurements 5 minutes apart?"     199/115 2. ONSET: "When did you take your blood pressure?"     This am 3. HOW: "How did you obtain the blood pressure?" (e.g., visiting nurse, automatic home BP monitor)      Home monitor 4. HISTORY: "Do you have a history of high blood pressure?"     yes 5. MEDICATIONS: "Are you taking any medications for blood pressure?" "Have you missed any doses recently?"     Yes. Has not taken Toprol x 4-5 days 6. OTHER SYMPTOMS: "Do you have any symptoms?" (e.g., headache, chest pain, blurred vision, difficulty breathing, weakness)     "Mild " headache, mild lightheadness "Few weeks"  Protocols used: HIGH BLOOD PRESSURE-A-AH

## 2018-06-11 NOTE — Telephone Encounter (Signed)
Called patient to chk on blood pressure, no answer. Forwarding message to her PCP.

## 2018-06-11 NOTE — Telephone Encounter (Signed)
noted 

## 2018-06-11 NOTE — Telephone Encounter (Signed)
Spoke to pt, now that she is taking the correct medication she feels there is no need to come for ov. Last BP was 179/92 recorded on yesterday. She feels it is regulating itself. She is still refusing ED. She will make a follow up appt after she has taken the medication for a few days, per pt.

## 2018-06-11 NOTE — Telephone Encounter (Signed)
Please schedule patient for next week as I wont be in this week. She needs to bring in all her medications so that we can reconcile them. thanks

## 2018-06-13 ENCOUNTER — Telehealth: Payer: Self-pay | Admitting: Family Medicine

## 2018-06-13 DIAGNOSIS — N182 Chronic kidney disease, stage 2 (mild): Secondary | ICD-10-CM | POA: Diagnosis not present

## 2018-06-13 DIAGNOSIS — E1122 Type 2 diabetes mellitus with diabetic chronic kidney disease: Secondary | ICD-10-CM | POA: Diagnosis not present

## 2018-06-13 DIAGNOSIS — I509 Heart failure, unspecified: Secondary | ICD-10-CM | POA: Diagnosis not present

## 2018-06-13 DIAGNOSIS — E669 Obesity, unspecified: Secondary | ICD-10-CM | POA: Diagnosis not present

## 2018-06-13 DIAGNOSIS — M1990 Unspecified osteoarthritis, unspecified site: Secondary | ICD-10-CM | POA: Diagnosis not present

## 2018-06-13 DIAGNOSIS — F329 Major depressive disorder, single episode, unspecified: Secondary | ICD-10-CM | POA: Diagnosis not present

## 2018-06-13 DIAGNOSIS — E785 Hyperlipidemia, unspecified: Secondary | ICD-10-CM | POA: Diagnosis not present

## 2018-06-13 DIAGNOSIS — E1151 Type 2 diabetes mellitus with diabetic peripheral angiopathy without gangrene: Secondary | ICD-10-CM | POA: Diagnosis not present

## 2018-06-13 DIAGNOSIS — I130 Hypertensive heart and chronic kidney disease with heart failure and stage 1 through stage 4 chronic kidney disease, or unspecified chronic kidney disease: Secondary | ICD-10-CM | POA: Diagnosis not present

## 2018-06-13 NOTE — Telephone Encounter (Signed)
Copied from [place] [id number]. Topic: Quick Communication - See Telephone Encounter >> Jun 13, 2018  2:43 PM Mylinda Latina, NT wrote: CRM for notification. See Telephone encounter for: 06/13/18.Patient called and states that her BP was elevated today 210/90 w/ no other symptoms .Patient stated that she wanted an appt to see the provider. Call my Triage nurse and she stated the patient was triaged twice and refused going to the ED or an OV , to call the office (Primary Care at [facility]) to see if they can triage the patient or get advise on what to do. I called the office and was unsuccessful in talking to the nurse about this patient blood pressure.  I reached out to the manager her at the [facility] on what I needed to do next. She advise me to let the patient know at this point that she needed to be seen at either an Urgent Care or ED to seek treatment then after her treatment we could schedule her and appt to be seen. I relayed this message to the patient and she verbalize understanding and she stated that she was going to go to the ED.

## 2018-06-14 ENCOUNTER — Other Ambulatory Visit: Payer: Self-pay

## 2018-06-14 ENCOUNTER — Emergency Department (HOSPITAL_COMMUNITY)
Admission: EM | Admit: 2018-06-14 | Discharge: 2018-06-15 | Disposition: A | Discharge status: Home / Self Care | Payer: Medicare HMO | Attending: Emergency Medicine | Admitting: Emergency Medicine

## 2018-06-14 ENCOUNTER — Encounter (HOSPITAL_COMMUNITY): Payer: Self-pay | Admitting: Emergency Medicine

## 2018-06-14 ENCOUNTER — Emergency Department (HOSPITAL_COMMUNITY): Payer: Medicare HMO

## 2018-06-14 DIAGNOSIS — I110 Hypertensive heart disease with heart failure: Secondary | ICD-10-CM | POA: Diagnosis not present

## 2018-06-14 DIAGNOSIS — Z7902 Long term (current) use of antithrombotics/antiplatelets: Secondary | ICD-10-CM | POA: Diagnosis not present

## 2018-06-14 DIAGNOSIS — R079 Chest pain, unspecified: Secondary | ICD-10-CM | POA: Diagnosis not present

## 2018-06-14 DIAGNOSIS — Z8673 Personal history of transient ischemic attack (TIA), and cerebral infarction without residual deficits: Secondary | ICD-10-CM | POA: Diagnosis not present

## 2018-06-14 DIAGNOSIS — F17210 Nicotine dependence, cigarettes, uncomplicated: Secondary | ICD-10-CM | POA: Insufficient documentation

## 2018-06-14 DIAGNOSIS — E119 Type 2 diabetes mellitus without complications: Secondary | ICD-10-CM | POA: Insufficient documentation

## 2018-06-14 DIAGNOSIS — I509 Heart failure, unspecified: Secondary | ICD-10-CM | POA: Insufficient documentation

## 2018-06-14 DIAGNOSIS — Z7984 Long term (current) use of oral hypoglycemic drugs: Secondary | ICD-10-CM | POA: Insufficient documentation

## 2018-06-14 DIAGNOSIS — G4489 Other headache syndrome: Secondary | ICD-10-CM | POA: Diagnosis not present

## 2018-06-14 DIAGNOSIS — I10 Essential (primary) hypertension: Secondary | ICD-10-CM | POA: Diagnosis not present

## 2018-06-14 DIAGNOSIS — M6281 Muscle weakness (generalized): Secondary | ICD-10-CM | POA: Diagnosis not present

## 2018-06-14 DIAGNOSIS — R42 Dizziness and giddiness: Secondary | ICD-10-CM

## 2018-06-14 DIAGNOSIS — R4182 Altered mental status, unspecified: Secondary | ICD-10-CM | POA: Diagnosis not present

## 2018-06-14 DIAGNOSIS — R29898 Other symptoms and signs involving the musculoskeletal system: Secondary | ICD-10-CM

## 2018-06-14 DIAGNOSIS — R0602 Shortness of breath: Secondary | ICD-10-CM | POA: Diagnosis not present

## 2018-06-14 DIAGNOSIS — Z79899 Other long term (current) drug therapy: Secondary | ICD-10-CM | POA: Insufficient documentation

## 2018-06-14 DIAGNOSIS — Z7982 Long term (current) use of aspirin: Secondary | ICD-10-CM | POA: Insufficient documentation

## 2018-06-14 LAB — URINALYSIS, ROUTINE W REFLEX MICROSCOPIC
BILIRUBIN URINE: NEGATIVE
Glucose, UA: NEGATIVE mg/dL
HGB URINE DIPSTICK: NEGATIVE
KETONES UR: NEGATIVE mg/dL
LEUKOCYTES UA: NEGATIVE
Nitrite: NEGATIVE
PROTEIN: 100 mg/dL — AB
SPECIFIC GRAVITY, URINE: 1.02 (ref 1.005–1.030)
pH: 5 (ref 5.0–8.0)

## 2018-06-14 LAB — BASIC METABOLIC PANEL
Anion gap: 8 (ref 5–15)
BUN: 18 mg/dL (ref 8–23)
CO2: 25 mmol/L (ref 22–32)
Calcium: 8.8 mg/dL — ABNORMAL LOW (ref 8.9–10.3)
Chloride: 109 mmol/L (ref 98–111)
Creatinine, Ser: 1.15 mg/dL — ABNORMAL HIGH (ref 0.44–1.00)
GFR calc Af Amer: 56 mL/min — ABNORMAL LOW (ref >60)
GFR calc non Af Amer: 48 mL/min — ABNORMAL LOW (ref >60)
Glucose, Bld: 143 mg/dL — ABNORMAL HIGH (ref 70–99)
Potassium: 4 mmol/L (ref 3.5–5.1)
Sodium: 142 mmol/L (ref 135–145)

## 2018-06-14 LAB — I-STAT TROPONIN, ED
TROPONIN I, POC: 0.01 ng/mL (ref 0.00–0.08)
Troponin i, poc: 0 ng/mL (ref 0.00–0.08)

## 2018-06-14 LAB — CBC
HCT: 39.7 % (ref 36.0–46.0)
Hemoglobin: 12.7 g/dL (ref 12.0–15.0)
MCH: 29.5 pg (ref 26.0–34.0)
MCHC: 32 g/dL (ref 30.0–36.0)
MCV: 92.3 fL (ref 78.0–100.0)
Platelets: 285 10*3/uL (ref 150–400)
RBC: 4.3 MIL/uL (ref 3.87–5.11)
RDW: 13.6 % (ref 11.5–15.5)
WBC: 7.1 10*3/uL (ref 4.0–10.5)

## 2018-06-14 MED ORDER — AMLODIPINE BESYLATE 5 MG PO TABS
5.0000 mg | ORAL_TABLET | Freq: Once | ORAL | Status: AC
  Administered 2018-06-14: 5 mg via ORAL
  Filled 2018-06-14: qty 1

## 2018-06-14 MED ORDER — SODIUM CHLORIDE 0.9 % IV BOLUS
500.0000 mL | Freq: Once | INTRAVENOUS | Status: AC
  Administered 2018-06-14: 500 mL via INTRAVENOUS

## 2018-06-14 MED ORDER — ENALAPRIL MALEATE 20 MG PO TABS
20.0000 mg | ORAL_TABLET | Freq: Once | ORAL | Status: AC
  Administered 2018-06-14: 20 mg via ORAL
  Filled 2018-06-14: qty 1

## 2018-06-14 NOTE — ED Notes (Signed)
Pt ambulated with assistance out of room and approx 100 ft down hallway and back. Tolerated well.

## 2018-06-14 NOTE — ED Provider Notes (Signed)
[facility] EMERGENCY DEPARTMENT Provider Note   CSN: [id number] Arrival date & time: 06/14/18  1604     History   Chief Complaint Chief Complaint  Patient presents with  . Dizziness  . Weakness    HPI Alicia [name] is a 67 y.o. female.  HPI  Alicia [name] is a 67 year old female with a history of diastolic CHF (EF 40%), CVA, PVD, NIDDM, poorly controlled hypertension, hyperlipidemia, peripheral neuropathy, renal insufficiency, chronic bilateral lower extremity weakness who presents to the emergency department for evaluation of lightheadedness and weakness.  Patient reports that she thought that her weakness was improving, but over the past week has had gradually worsening bilateral lower extremity weakness.  She also reports that her head feels weak and she feels lightheaded, as if she is going to pass out.  She denies loss of consciousness.  States that she has had normal p.o. fluid and food intake.  States that in the past when she had the symptoms, she passed out and was worried this was going to happen again today.  She lives alone.  States that while in the ambulance she had an episode of left-sided mild chest pain which did not radiate and lasted for about 3 to 5 minutes.  She states that this is not new chest pain for her and that this occurs every so often and she believes it is related to gas.  She denies associated shortness of breath nausea/vomiting, diaphoresis.  She denies chest pain currently.  She also reports that her blood pressures been elevated at home and was measured at 199/111 earlier today.  She has been taking her prescribed antihypertensives, although recently was taken off HCTZ.  She denies visual disturbance, numbness, difficulty with speech or swallowing, fever, chills, shortness of breath, abdominal pain, room spinning sensation, leg swelling or calf tenderness, rash.  She is able to ambulate independently.  Past Medical History:    Diagnosis Date  . Arthritis   . Chronic headaches   . Constipation   . Depression   . Diabetes mellitus   . Hyperlipidemia   . Hypertension   . Obesity   . PAD (peripheral artery disease) ([place])   . Shortness of breath   . Stroke [facility])     Patient Active Problem List   Diagnosis Date Noted  . Prolonged Q-T interval on ECG 04/27/2018  . CHF (congestive heart failure) ([place]) 04/27/2018  . Weakness 04/03/2018  . Syncope 04/03/2018  . Renal insufficiency 03/16/2018  . Diabetes mellitus type 2 in obese ([place]) 03/16/2018  . Morbid obesity ([place])   . CVA (cerebral vascular accident) ([place]) 03/15/2018  . Numbness in both hands- and Feet 06/06/2014  . Weakness of both legs 06/06/2014  . Swelling of limb-Left Leg > Right 06/06/2014  . Peripheral vascular disease, unspecified ([place]) 11/30/2012  . Pain in limb 11/30/2012  . Atherosclerosis of native arteries of the extremities with intermittent claudication 04/06/2012  . CHEST PAIN 01/03/2011  . Hyperlipidemia 07/20/2007  . Essential hypertension, benign 07/20/2007  . ARTHRITIS 07/20/2007    Past Surgical History:  Procedure Laterality Date  . FEMORAL BYPASS  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 Bridgett Larsson     OB History   None      Home Medications    Prior to Admission medications   Medication Sig Start Date End Date Taking? Authorizing Provider  acetaminophen (TYLENOL) 500 MG tablet Take 500-1,000 mg by mouth every 6 (  six) hours as needed (for pain).    [provider]  aspirin 81 MG EC tablet Take 1 tablet (81 mg total) by mouth daily. 04/06/18   Rutherford Guys, MD  atorvastatin (LIPITOR) 80 MG tablet Take 1 tablet (80 mg total) by mouth daily at 6 PM. 05/04/18   Rutherford Guys, MD  clopidogrel (PLAVIX) 75 MG tablet Take 1 tablet (75 mg total) by mouth daily. 04/06/18   Rutherford Guys, MD  enalapril (VASOTEC) 20 MG tablet Take 1 tablet (20 mg total) by mouth 2 (two) times daily.  04/06/18   Rutherford Guys, MD  labetalol (NORMODYNE) 100 MG tablet Take 1 tablet (100 mg total) by mouth 2 (two) times daily. Patient not taking: Reported on 05/04/2018 04/06/18   Rutherford Guys, MD  metFORMIN (GLUCOPHAGE) 1000 MG tablet Take 1 tablet (1,000 mg total) by mouth 2 (two) times daily with a meal. 04/06/18   Rutherford Guys, MD  metoprolol succinate (TOPROL-XL) 50 MG 24 hr tablet TAKE 1 TABLET BY MOUTH ONCE DAILY FOR 7 DAYS IF TOLERATED WELL AND SYSTOLIC BLOOD PRESSURE IS GREATER THAN 160 MMHG INCREASE TO 2 TABLETS ON 04/26/18   [provider]  NIFEdipine (PROCARDIA-XL/ADALAT CC) 60 MG 24 hr tablet Take 1 tablet (60 mg total) by mouth daily. 04/06/18   Rutherford Guys, MD  spironolactone (ALDACTONE) 25 MG tablet Take 0.5 tablets (12.5 mg total) by mouth daily. 04/27/18   Rutherford Guys, MD    Family History Family History  Problem Relation Age of Onset  . Diabetes Father   . Heart disease Mother        NOT before age 99-  Bypass  . Hypertension Mother   . Hypertension Sister   . Varicose Veins Brother   . Heart disease Brother        Before age 37  . Hypertension Daughter     Social History Social History   Tobacco Use  . Smoking status: Light Tobacco Smoker    Packs/day: 0.50    Years: 35.00    Pack years: 17.50    Types: Cigarettes  . Smokeless tobacco: Never Used  . Tobacco comment: patient is trying to quit and has gone down to half a pack per day  Substance Use Topics  . Alcohol use: Yes    Alcohol/week: 0.6 oz    Types: 1 Standard drinks or equivalent per week    Comment: occasional use only  . Drug use: No     Allergies   Nifedipine er and Latex   Review of Systems Review of Systems  Constitutional: Negative for chills, diaphoresis and fever.  HENT: Negative for sore throat and trouble swallowing.   Eyes: Negative for visual disturbance.  Respiratory: Negative for cough and shortness of breath.   Cardiovascular: Positive for chest pain.  Negative for leg swelling.  Gastrointestinal: Negative for abdominal pain, nausea and vomiting.  Genitourinary: Negative for difficulty urinating and dysuria.  Musculoskeletal: Negative for back pain and gait problem.  Skin: Negative for rash.  Neurological: Positive for weakness (bilateral LE) and light-headedness. Negative for syncope, facial asymmetry, speech difficulty and numbness.  Psychiatric/Behavioral: Negative for agitation.     Physical Exam Updated Vital Signs BP (!) 198/87   Pulse 72   Temp 98.2 F (36.8 C) (Oral)   Resp (!) 22   Ht 5\' 7"  (1.702 m)   Wt 114.8 kg (253 lb)   SpO2 97%   BMI 39.63 kg/m  Physical Exam  Constitutional: She is oriented to person, place, and time. She appears well-developed and well-nourished. No distress.  Appears comfortable, NAD.   HENT:  Head: Normocephalic and atraumatic.  Mouth/Throat: Oropharynx is clear and moist. No oropharyngeal exudate.  Eyes: Pupils are equal, round, and reactive to light. Conjunctivae are normal. Right eye exhibits no discharge. Left eye exhibits no discharge.  Neck: Normal range of motion. Neck supple.  Cardiovascular: Normal rate and regular rhythm.  DP pulses found with bedside doppler. Radial pulses 1+ bilaterally.   Pulmonary/Chest: Effort normal and breath sounds normal. No stridor. No respiratory distress. She has no wheezes. She has no rales.  Abdominal: Soft. Bowel sounds are normal. There is no tenderness.  Neurological: She is alert and oriented to person, place, and time. Coordination normal.  Mental Status:  Alert, oriented, thought content appropriate, able to give a coherent history. Speech fluent without evidence of aphasia. Able to follow 2 step commands without difficulty.  Cranial Nerves:  II:  Peripheral visual fields grossly normal, pupils equal, round, reactive to light VIII: hearing grossly normal to voice  X: uvula elevates symmetrically  XI: bilateral shoulder shrug symmetric and  strong XII: midline tongue extension without fassiculations Motor:  Normal tone. 5/5 in upper and lower extremities bilaterally including strong and equal grip strength and dorsiflexion/plantar flexion as well as knee flexion/extension. Sensory: Pinprick and light touch normal in all extremities.    Skin: Skin is warm and dry. She is not diaphoretic.  Psychiatric: She has a normal mood and affect. Her behavior is normal.  Nursing note and vitals reviewed.    ED Treatments / Results  Labs (all labs ordered are listed, but only abnormal results are displayed) Labs Reviewed  BASIC METABOLIC PANEL - Abnormal; Notable for the following components:      Result Value   Glucose, Bld 143 (*)    Creatinine, Ser 1.15 (*)    Calcium 8.8 (*)    GFR calc non Af Amer 48 (*)    GFR calc Af Amer 56 (*)    All other components within normal limits  URINALYSIS, ROUTINE W REFLEX MICROSCOPIC - Abnormal; Notable for the following components:   Protein, ur 100 (*)    Bacteria, UA RARE (*)    All other components within normal limits  CBC  I-STAT TROPONIN, ED  I-STAT TROPONIN, ED    EKG EKG Interpretation  Date/Time:  Thursday June 14 2018 21:16:21 EDT Ventricular Rate:  73 PR Interval:  150 QRS Duration: 108 QT Interval:  462 QTC Calculation: 779 R Axis:   -12 Text Interpretation:  Sinus rhythm Supraventricular bigeminy LAE, consider biatrial enlargement Left ventricular hypertrophy No significant change since last tracing Confirmed by Virgel Manifold [phone]) on 06/14/2018 11:22:42 PM   Radiology Dg Chest 2 View  Result Date: 06/14/2018 CLINICAL DATA:  Chest pain for the past 3 days.  Smoker. EXAM: CHEST - 2 VIEW COMPARISON:  03/23/2018. FINDINGS: Stable enlarged cardiac silhouette and mildly prominent interstitial markings. Normal vascularity. Thoracic spine and bilateral shoulder degenerative changes. IMPRESSION: 1. No acute abnormality. 2. Stable cardiomegaly and mild chronic interstitial  lung disease compatible with the history of smoking. Electronically Signed   By: Claudie Revering M.D.   On: 06/14/2018 17:38    Procedures Procedures (including critical care time)  Medications Ordered in ED Medications  sodium chloride 0.9 % bolus 500 mL (0 mLs Intravenous Stopped 06/14/18 2112)  sodium chloride 0.9 % bolus 500 mL (0 mLs Intravenous Stopped  06/14/18 2209)  enalapril (VASOTEC) tablet 20 mg (20 mg Oral Given 06/14/18 2207)  amLODipine (NORVASC) tablet 5 mg (5 mg Oral Given 06/14/18 2300)     Initial Impression / Assessment and Plan / ED Course  I have reviewed the triage vital signs and the nursing notes.  Pertinent labs & imaging results that were available during my care of the patient were reviewed by me and considered in my medical decision making (see chart for details).     Patient presents to the emergency department for evaluation of lightheadedness and progressive bilateral lower extremity weakness.  Per chart review, she has been seen in the ER with similar symptoms three times since May, 2019.  She had a stroke in April, 2019.  Also reported an episode of chest pain while being transported by EMS, states that this is typical of her normal headache. No sob, nausea/vomiting.  She denies chest pain currently.  On exam she is afebrile and nontoxic-appearing.  She is hypertensive with blood pressure 198/87. She has no focal neurological deficits.  5/5 strength in bilateral upper and lower extremities. No neurological deficits on exam to suggest acute stroke.   Results reviewed, EKG without significant changes and delta troponin negative. Given results and report that her chest pain earlier today is normal and unchanged from baseline do not suspect ACS. No PE risk factors, hypoxia or shortness of breath and I do not suspect PE.   Otherwise, lab work largely unremarkable.  UA without evidence of infection.  CBC WNL.  No major electrolyte abnormalities, creatinine at baseline.   Chest x-ray without acute abnormality.  Patient hydrated with 1 L normal saline.  She reports that her lightheadedness is somewhat improved.  She is able to ablate in the hallway with 2 person assist, uses a walker regularly at home. Plan to have her follow up with her PCP regarding further management of her blood pressure. I discussed the importance of fluid hydration at home. Counseled her on return precautions and she agrees. Discussed this patient with Dr. Wilson Singer who agrees with plan to discharge home.    Final Clinical Impressions(s) / ED Diagnoses   Final diagnoses:  Lightheadedness  Weakness of both lower extremities  Essential hypertension    ED Discharge Orders    None       Bernarda Caffey 06/16/18 0044    Virgel Manifold, MD 06/17/18 1459

## 2018-06-14 NOTE — ED Triage Notes (Signed)
Patient arrived via ems, from home with c/o dizziness, weakness and HA for the past few days. Also c/o CP with EMS-they gave 324 asa with relief, no c/o CP att. States her blood pressure has been high and they recently changed her medication from hydrochlorothiazide to metoprolol. AOx4.

## 2018-06-15 NOTE — ED Notes (Signed)
Patient Alert and oriented to baseline. Stable and ambulatory to baseline. Patient verbalized understanding of the discharge instructions.  Patient belongings were taken by the patient.   

## 2018-06-15 NOTE — Discharge Instructions (Signed)
As we discussed follow-up with your regular doctor regarding your blood pressure.  Continue taking her prescribed medicines at home.  Your blood work, EKG, chest x-ray were reassuring.  Please stay hydrated with plenty of fluids.  Return to the ER if you have any new or concerning symptoms like feeling as if you are going to pass out, have chest pain, trouble breathing, trouble with your vision, new numbness or weakness.

## 2018-06-21 ENCOUNTER — Encounter: Payer: Self-pay | Admitting: Family Medicine

## 2018-06-21 ENCOUNTER — Ambulatory Visit (INDEPENDENT_AMBULATORY_CARE_PROVIDER_SITE_OTHER): Payer: Medicare HMO | Admitting: Family Medicine

## 2018-06-21 VITALS — BP 148/88 | HR 98 | Temp 99.1°F | Ht 67.0 in | Wt 253.0 lb

## 2018-06-21 DIAGNOSIS — M2669 Other specified disorders of temporomandibular joint: Secondary | ICD-10-CM | POA: Diagnosis not present

## 2018-06-21 DIAGNOSIS — E1169 Type 2 diabetes mellitus with other specified complication: Secondary | ICD-10-CM | POA: Diagnosis not present

## 2018-06-21 DIAGNOSIS — J0100 Acute maxillary sinusitis, unspecified: Secondary | ICD-10-CM | POA: Diagnosis not present

## 2018-06-21 DIAGNOSIS — E669 Obesity, unspecified: Secondary | ICD-10-CM

## 2018-06-21 DIAGNOSIS — R6889 Other general symptoms and signs: Secondary | ICD-10-CM | POA: Diagnosis not present

## 2018-06-21 DIAGNOSIS — I10 Essential (primary) hypertension: Secondary | ICD-10-CM | POA: Diagnosis not present

## 2018-06-21 LAB — HEMOGLOBIN A1C
Est. average glucose Bld gHb Est-mCnc: 177 mg/dL
Hgb A1c MFr Bld: 7.8 % — ABNORMAL HIGH (ref 4.8–5.6)

## 2018-06-21 MED ORDER — SPIRONOLACTONE 25 MG PO TABS: 25.0000 mg | ORAL_TABLET | Freq: Every day | ORAL | 1 refills | Status: DC

## 2018-06-21 MED ORDER — FLUTICASONE PROPIONATE 50 MCG/ACT NA SUSP: 1.0000 | Freq: Two times a day (BID) | NASAL | 6 refills | Status: DC

## 2018-06-21 MED ORDER — AMOXICILLIN-POT CLAVULANATE 875-125 MG PO TABS
1.0000 | ORAL_TABLET | Freq: Two times a day (BID) | ORAL | 0 refills | Status: DC
Start: 2018-06-21 — End: 2018-07-19

## 2018-06-21 NOTE — Patient Instructions (Addendum)
     IF you received an x-ray today, you will receive an invoice from [facility]. Please contact [facility] at [phone] with questions or concerns regarding your invoice.   IF you received labwork today, you will receive an invoice from [facility]. Please contact [facility] at [phone] with questions or concerns regarding your invoice.   Our billing staff will not be able to assist you with questions regarding bills from these companies.  You will be contacted with the lab results as soon as they are available. The fastest way to get your results is to activate your My Chart account. Instructions are located on the last page of this paperwork. If you have not heard from us regarding the results in 2 weeks, please contact this office.     Sinusitis, Adult Sinusitis is soreness and inflammation of your sinuses. Sinuses are hollow spaces in the bones around your face. They are located:  Around your eyes.  In the middle of your forehead.  Behind your nose.  In your cheekbones.  Your sinuses and nasal passages are lined with a stringy fluid (mucus). Mucus normally drains out of your sinuses. When your nasal tissues get inflamed or swollen, the mucus can get trapped or blocked so air cannot flow through your sinuses. This lets bacteria, viruses, and funguses grow, and that leads to infection. Follow these instructions at home: Medicines  Take, use, or apply over-the-counter and prescription medicines only as told by your doctor. These may include nasal sprays.  If you were prescribed an antibiotic medicine, take it as told by your doctor. Do not stop taking the antibiotic even if you start to feel better. Hydrate and Humidify  Drink enough water to keep your pee (urine) clear or pale yellow.  Use a cool mist humidifier to keep the humidity level in your home above 50%.  Breathe in steam for 10-15 minutes, 3-4 times a day or as told by your doctor. You can do  this in the bathroom while a hot shower is running.  Try not to spend time in cool or dry air. Rest  Rest as much as possible.  Sleep with your head raised (elevated).  Make sure to get enough sleep each night. General instructions  Put a warm, moist washcloth on your face 3-4 times a day or as told by your doctor. This will help with discomfort.  Wash your hands often with soap and water. If there is no soap and water, use hand sanitizer.  Do not smoke. Avoid being around people who are smoking (secondhand smoke).  Keep all follow-up visits as told by your doctor. This is important. Contact a doctor if:  You have a fever.  Your symptoms get worse.  Your symptoms do not get better within 10 days. Get help right away if:  You have a very bad headache.  You cannot stop throwing up (vomiting).  You have pain or swelling around your face or eyes.  You have trouble seeing.  You feel confused.  Your neck is stiff.  You have trouble breathing. This information is not intended to replace advice given to you by your health care provider. Make sure you discuss any questions you have with your health care provider. Document Released: 04/25/2008 Document Revised: 07/03/2016 Document Reviewed: 09/02/2015 Elsevier Interactive Patient Education  2018 [employer]  

## 2018-06-21 NOTE — Progress Notes (Signed)
8/1/201912:06 PM  Alicia [name] March 08, 1951, 67 y.o. female [id number]  Chief Complaint  Patient presents with  . Follow-up     Hypertension    HPI:   Patient is a 67 y.o. female with past medical history significant for HTN, DM2, HLP, PAD w hx L fem-pop bypass, CVA, dCHF, smoker who presents today for routine followup  Runny nose, ear pressure, pain done to jaw x several weeks + blurry vision, intermittent No headaches, but temporary temporal pain, jaw feels tired Some chills but no fevers, having sweats occ sinus pain  Seen in ER on 7/26th for dizziness and elevated BP She has been taking spironolactone 1 tab on most days per cards instruction Neuro stopped plavix, ok to cont on ASA  Checks cbgs fasting today 141, range 120-180s, but mostly live in 140-150s Sees eye doctor at [facility], she needs to make appt  Lab Results  Component Value Date   HGBA1C 7.7 (H) 03/16/2018    Fall Risk  06/21/2018 04/06/2018  Falls in the past year? No No     Depression screen [facility] 2/9 06/21/2018 04/06/2018  Decreased Interest 0 0  Down, Depressed, Hopeless 0 0  PHQ - 2 Score 0 0    Allergies  Allergen Reactions  . Nifedipine Er Other (See Comments)    Caused nose bleeds in higher doses (90 mg., namely)  . Latex Rash    Prior to Admission medications   Medication Sig Start Date End Date Taking? Authorizing Provider  acetaminophen (TYLENOL) 500 MG tablet Take 500-1,000 mg by mouth every 6 (six) hours as needed (for pain).   Yes [provider]  aspirin 81 MG EC tablet Take 1 tablet (81 mg total) by mouth daily. 04/06/18  Yes Rutherford Guys, MD  atorvastatin (LIPITOR) 80 MG tablet Take 1 tablet (80 mg total) by mouth daily at 6 PM. 05/04/18  Yes Rutherford Guys, MD  enalapril (VASOTEC) 20 MG tablet Take 1 tablet (20 mg total) by mouth 2 (two) times daily. 04/06/18  Yes Rutherford Guys, MD  metFORMIN (GLUCOPHAGE) 1000 MG tablet Take 1 tablet (1,000 mg total) by mouth  2 (two) times daily with a meal. 04/06/18  Yes Rutherford Guys, MD  metoprolol succinate (TOPROL-XL) 50 MG 24 hr tablet take 26m by mouth once daily 04/26/18  Yes [provider]  NIFEdipine (PROCARDIA-XL/ADALAT CC) 60 MG 24 hr tablet Take 1 tablet (60 mg total) by mouth daily. 04/06/18  Yes SRutherford Guys MD  spironolactone (ALDACTONE) 25 MG tablet Take 0.5 tablets (12.5 mg total) by mouth daily. 04/27/18  Yes SRutherford Guys MD    Past Medical History:  Diagnosis Date  . Arthritis   . Chronic headaches   . Constipation   . Depression   . Diabetes mellitus   . Hyperlipidemia   . Hypertension   . Obesity   . PAD (peripheral artery disease) ([facility]   . Shortness of breath   . Stroke ([facility]     Past Surgical History:  Procedure Laterality Date  . FEMORAL BYPASS  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 CBridgett Larsson   Social History   Tobacco Use  . Smoking status: Light Tobacco Smoker    Packs/day: 0.50    Years: 35.00    Pack years: 17.50    Types: Cigarettes  . Smokeless tobacco: Never Used  . Tobacco comment: patient is trying to quit and has gone down  to half a pack per day  Substance Use Topics  . Alcohol use: Yes    Alcohol/week: 0.6 oz    Types: 1 Standard drinks or equivalent per week    Comment: occasional use only    Family History  Problem Relation Age of Onset  . Diabetes Father   . Heart disease Mother        NOT before age 69-  Bypass  . Hypertension Mother   . Hypertension Sister   . Varicose Veins Brother   . Heart disease Brother        Before age 23  . Hypertension Daughter     Review of Systems  Respiratory: Negative for cough and shortness of breath.   Cardiovascular: Negative for chest pain, palpitations and leg swelling.   See hpi  OBJECTIVE:  Blood pressure (!) 148/88, pulse 98, temperature 99.1 F (37.3 C), temperature source Oral, height _0  (1.702 m), weight 253 lb (114.8 kg), SpO2 95 %. Body  mass index is 39.63 kg/m.   Physical Exam  Constitutional: She is oriented to person, place, and time. She appears well-developed and well-nourished.  HENT:  Head: Normocephalic and atraumatic.  Right Ear: Hearing, tympanic membrane, external ear and ear canal normal.  Left Ear: Hearing, tympanic membrane, external ear and ear canal normal.  Nose: Mucosal edema and rhinorrhea present. Right sinus exhibits maxillary sinus tenderness.  Mouth/Throat: Oropharynx is clear and moist.  TA bilateral pulsatile, non indurated, non tender Mild TMJ TTP   Eyes: Pupils are equal, round, and reactive to light. EOM are normal.  Neck: Neck supple.  Cardiovascular: Normal rate, regular rhythm and normal heart sounds. Exam reveals no gallop and no friction rub.  No murmur heard. Pulmonary/Chest: Effort normal and breath sounds normal. She has no wheezes. She has no rales.  Lymphadenopathy:    She has no cervical adenopathy.  Neurological: She is alert and oriented to person, place, and time.  Skin: Skin is warm and dry.  Psychiatric: She has a normal mood and affect.  Nursing note and vitals reviewed.   ASSESSMENT and PLAN  1. Diabetes mellitus type 2 in obese [facility]) Checking labs today, medications will be adjusted as needed. Consider adding DDP4 - Hemoglobin A1c  2. Jaw claudication Per exam TMJ related, checking ESR to r/o TA - Sedimentation Rate  3. Essential hypertension, benign Significantly improved. Increasing spironolactone, monitor K  4. Acute non-recurrent maxillary sinusitis Discussed supportive measures, new meds r/se/b and RTC precautions. Patient educational handout given.  Other orders - spironolactone (ALDACTONE) 25 MG tablet; Take 1 tablet (25 mg total) by mouth daily. - amoxicillin-clavulanate (AUGMENTIN) 875-125 MG tablet; Take 1 tablet by mouth 2 (two) times daily. - fluticasone (FLONASE) 50 MCG/ACT nasal spray; Place 1 spray into both nostrils 2 (two) times  daily.  Return in about 3 months (around 09/21/2018).    Rutherford Guys, MD [facility] [address], [place] [address] Ph.  [phone] Fax [phone]

## 2018-06-22 LAB — SEDIMENTATION RATE: Sed Rate: 58 mm/hr — ABNORMAL HIGH (ref 0–40)

## 2018-07-03 ENCOUNTER — Institutional Professional Consult (permissible substitution): Payer: Medicare HMO | Admitting: Neurology

## 2018-07-13 ENCOUNTER — Other Ambulatory Visit: Payer: Self-pay | Admitting: Family Medicine

## 2018-07-13 NOTE — Telephone Encounter (Signed)
Enalapril refill request; LRF 04/06/18; # 60; RF x 2 PCP: Dr. Pamella Pert Last office visit: 06/21/18 Pharm: Suzie Portela on [address]  Medication refilled per protocol

## 2018-07-18 NOTE — Progress Notes (Signed)
[facility] [address] [place]. [place] [address] [phone]       OFFICE FOLLOW UP NOTE  [name] Date of Birth:  1951/06/19 Medical Record Number:  [id number]   Reason for Referral:  hospital stroke follow up  CHIEF COMPLAINT:  Chief Complaint  Patient presents with  . 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 History obtained from patient and chart review. Reviewed all radiology images and labs personally.  [name] is a 67 y.o. female with history of DM, HLD, HTN, obesity and PAD admitted for lightheadedness and generalized weakness for several weeks. No tPA given due to OSW.  MRI brain reviewed and showed right small CR infarct, old right cerebellar, mid pons and left thalamus infarcts.  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  2D echo showed an EF of 55 to 60%.  LDL 112 and A1c 7.7.  Patient was previously on aspirin 81 mg PTA and recommended to be discharged on aspirin 325 mg and Plavix 75 mg for 3 months and then continue Plavix alone due to intracranial stenosis.  Patient was on Crestor 10 mg PTA for cholesterol control and recommended d/c Crestor and start Lipitor 40 mg.  Patient was discharged to [facility] for continuation of therapies.  04/17/18 visit: Patient returns today for follow-up visit.  Patient has continued complaints of dizziness, fatigue and syncopal episodes where she was seen back in the hospital on 03/27/2018 and 04/03/2018.  After fluids were given and blood pressure medications manage patient was discharged home in stable condition.  Patient continues to have complaints at home of dizziness and fatigue and continues to believe these are related to her blood pressure.  Continues to work with PT/OT at home for balance  problems..  Blood pressures initially in this office was 199/89, asymptomatic.  After rechecking BP 170/90.  Patient does check BP at home and states that typically SBP 1 60-1 80.  She is currently compliant with her BP medications in which she is prescribed for them at this time.  States her PCP is currently trying to manage these medications.  Patient continues to take both aspirin and Plavix without side effects of bleeding or bruising.  When discussing stopping aspirin and continuing Plavix as recommended by Dr. Erlinda Hong in the hospital, patient got upset and stated that she was told that she only need to continue on aspirin at urgent care.  Explained to patient that as she was previously on aspirin 81 mg prior to the stroke, it would be highly recommended to continue Plavix for stroke prevention.  She then stated that she was not compliant with aspirin 81 mg and was not taking it regularly.  Patient would like to continue aspirin 81 mg after 3 months of DAPT and stop Plavix.  Recommended possible increase of aspirin to 325 mg but patient refusing this as well and electing to continue on aspirin 81 mg.  Continues to take Lipitor without side effects of myalgias.  Interval history 07/19/2018: Patient is being seen today for scheduled follow-up appointment.  She does have continued complaints of bilateral lower extremity pain.  This pain limits the amount of activities and exercise she can tolerate therefore feels as though she is feeling weaker in  both of her lower extremities.  She is currently using a cane for ambulation and does deny any recent falls.  She has completed all therapies.  Her primary provider recently increased Lipitor dose from 40 mg to 80 mg due to LDL 101 and elevated triglycerides.  Patient is unsure if her leg pains have worsened since this increase in June.  It was at first recommended to stop Lipitor and start Crestor and/or Zetia but patient hesitant due to not wanting to be on additional  medications.  It was agreed upon to stop Lipitor at this time for 4 days to see if muscle pains resolve as it is difficult to determine if her pains are due to statin therapy or general deconditioning/arthritis.  If pain is resolved, recommend starting Crestor 40 mg and/or Zetia with close PCP follow-up of cholesterol levels.  Patient understands that if pains do not resolve, to restart Lipitor 80 mg and follow-up with PCP for further work-up of bilateral lower extremity pain that limits daily activity.  Blood pressure elevated at 180/98 and his patient does monitor this at home, it typically runs between 170s 180s.  She was recently seen by PCP beginning of this month but at appointment, blood pressure satisfactory therefore there is no change in medications.  Patient is asymptomatic with elevation of blood pressure and recommended to follow-up with PCP if blood pressure remains high.  No medication changes for HTN made at today's appointment due to management by PCP.  Continues to take aspirin 81 mg without side effects of bleeding or bruising.  Patient also has complaint of central chest pain that radiates towards her back.  She was recently seen in the hospital due to lightheadedness and did have complaints of this pain but all work-up negative and it was determined to be possibly related to gas.  Patient has not started any medications for this and recommended to stop at a local pharmacy and obtain gas prevention medication to see if this helps resolve.  Recommended following up with PCP if it does not subside.  Does have an appointment scheduled Dr. Rexene Alberts on 08/21/2018 OSA work-up.  Denies new or worsening stroke/TIA symptoms.   ROS:   14 system review of systems performed and negative with exception of activity change, appetite change, unexpected weight change, excessive sweating, hearing loss, runny nose, eye discharge, blurred vision, chest pain, leg swelling, nausea, snoring, frequency of urination,  joint pain, back pain, walking difficulty, moles, itching, dizziness, headache, weakness, passing out and depression  PMH:  Past Medical History:  Diagnosis Date  . Arthritis   . Chronic headaches   . Constipation   . Depression   . Diabetes mellitus   . Hyperlipidemia   . Hypertension   . Obesity   . PAD (peripheral artery disease) ([place])   . Shortness of breath   . Stroke [facility])     PSH:  Past Surgical History:  Procedure Laterality Date  . FEMORAL BYPASS  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 Bridgett Larsson    Social History:  Social History   Socioeconomic History  . Marital status: Single    Spouse name: Not on file  . Number of children: Not on file  . Years of education: Not on file  . Highest education level: Not on file  Occupational History  . Not on file  Social Needs  . Financial resource strain: Not on file  . Food insecurity:    Worry:  Not on file    Inability: Not on file  . Transportation needs:    Medical: Not on file    Non-medical: Not on file  Tobacco Use  . Smoking status: Light Tobacco Smoker    Packs/day: 0.50    Years: 35.00    Pack years: 17.50    Types: Cigarettes  . Smokeless tobacco: Never Used  . Tobacco comment: patient is trying to quit and has gone down to half a pack per day  Substance and Sexual Activity  . Alcohol use: Yes    Alcohol/week: 1.0 standard drinks    Types: 1 Standard drinks or equivalent per week    Comment: occasional use only  . Drug use: No  . Sexual activity: Not on file  Lifestyle  . Physical activity:    Days per week: Not on file    Minutes per session: Not on file  . Stress: Not on file  Relationships  . Social connections:    Talks on phone: Not on file    Gets together: Not on file    Attends religious service: Not on file    Active member of club or organization: Not on file    Attends meetings of clubs or organizations: Not on file    Relationship status: Not on file   . Intimate partner violence:    Fear of current or ex partner: Not on file    Emotionally abused: Not on file    Physically abused: Not on file    Forced sexual activity: Not on file  Other Topics Concern  . Not on file  Social History Narrative  . Not on file    Family History:  Family History  Problem Relation Age of Onset  . Diabetes Father   . Heart disease Mother        NOT before age 11-  Bypass  . Hypertension Mother   . Hypertension Sister   . Varicose Veins Brother   . Heart disease Brother        Before age 61  . Hypertension Daughter     Medications:   Current Outpatient Medications on File Prior to Visit  Medication Sig Dispense Refill  . acetaminophen (TYLENOL) 500 MG tablet Take 500-1,000 mg by mouth every 6 (six) hours as needed (for pain).    Marland Kitchen aspirin 81 MG EC tablet Take 1 tablet (81 mg total) by mouth daily. 30 tablet 0  . atorvastatin (LIPITOR) 80 MG tablet Take 1 tablet (80 mg total) by mouth daily at 6 PM. 90 tablet 3  . enalapril (VASOTEC) 20 MG tablet TAKE 1 TABLET BY MOUTH TWICE DAILY 60 tablet 2  . fluticasone (FLONASE) 50 MCG/ACT nasal spray Place 1 spray into both nostrils 2 (two) times daily. 16 g 6  . metFORMIN (GLUCOPHAGE) 1000 MG tablet Take 1 tablet (1,000 mg total) by mouth 2 (two) times daily with a meal. 60 tablet 2  . metoprolol succinate (TOPROL-XL) 50 MG 24 hr tablet take 50mg  by mouth once daily  0  . NIFEdipine (PROCARDIA-XL/ADALAT CC) 60 MG 24 hr tablet Take 1 tablet (60 mg total) by mouth daily. 30 tablet 2  . spironolactone (ALDACTONE) 25 MG tablet Take 1 tablet (25 mg total) by mouth daily. 90 tablet 1   No current facility-administered medications on file prior to visit.     Allergies:   Allergies  Allergen Reactions  . Nifedipine Er Other (See Comments)    Caused nose bleeds in higher  doses (90 mg., namely)  . Latex Rash     Physical Exam  Vitals:   07/19/18 1246  Weight: 256 lb (116.1 kg)   Body mass index is  40.1 kg/m. No exam data present  General: well developed, obese pleasant African-American female, well nourished, seated, in no evident distress Head: head normocephalic and atraumatic.   Neck: supple with no carotid or supraclavicular bruits Cardiovascular: regular rate and rhythm, no murmurs Musculoskeletal: no deformity Skin:  no rash/petichiae Vascular:  Normal pulses all extremities  Neurologic Exam Mental Status: Awake and fully alert. Oriented to place and time. Recent and remote memory intact. Attention span, concentration and fund of knowledge appropriate. Mood and affect appropriate.  Cranial Nerves: Fundoscopic exam deferred. Pupils equal, briskly reactive to light. Extraocular movements full without nystagmus. Visual fields full to confrontation. Hearing intact. Facial sensation intact. Face, tongue, palate moves normally and symmetrically.  Motor: Normal bulk and tone. Normal strength in all tested extremity muscles except for mild bilateral hip flexor weakness with some possible giveaway weakness possibly due to pain Sensory.: intact to touch , pinprick , position and vibratory sensation.  Coordination: Rapid alternating movements normal in all extremities. Finger-to-nose and heel-to-shin performed accurately bilaterally. Gait and Station: Arises from chair with mild difficulty. Stance is normal. Gait demonstrates wobbling stride with assistance of cane.   Reflexes: 1+ and symmetric. Toes downgoing.      Diagnostic Data (Labs, Imaging, Testing)  MR BRAIN WO CONTRAST 03/15/2018 IMPRESSION: 1. 5 mm acute/early subacute infarction within the right mid corona radiata. No associated hemorrhage or mass effect. 2. Moderate for age chronic microvascular ischemic changes and parenchymal volume loss of the brain. 3. Small chronic infarctions in the right cerebellum, mid pons, and left basal ganglia. 4. Foci of chronic microhemorrhage in central distribution likely related to  chronic hypertension.  CT ANGIO NECK W OR WO CONTRAST CT ANGIO HEAD W OR WO CONTRAST CT HEAD WO CONTRAST 03/23/2018 IMPRESSION: 1. No acute intracranial abnormality. 2. Stable remote lacunar infarct of the left thalamus. 3. Stable remote right cerebellar infarcts. 4. Stable chronic white matter disease. 5. ASPECTS is 10/10  ECHOCARDIOGRAM 03/16/2018 Study Conclusions - Left ventricle: The cavity size was normal. Wall thickness was   increased in a pattern of mild LVH. Systolic function was normal.   The estimated ejection fraction was in the range of 55% to 60%.   Wall motion was normal; there were no regional wall motion   abnormalities. Doppler parameters are consistent with abnormal   left ventricular relaxation (grade 1 diastolic dysfunction). - Aortic valve: There was mild regurgitation. Valve area (VTI):   2.24 cm^2. Valve area (Vmax): 2.17 cm^2. Valve area (Vmean): 2.11   cm^2. - Right atrium: The atrium was moderately dilated.     ASSESSMENT: Alicia [name] is a 67 y.o. year old female here with right CR infarct on 03/15/18 secondary to small vessel disease. Vascular risk factors include HTN, HLD, DM and PAD.  Patient returns today for scheduled follow-up visit and has been stable from stroke standpoint but does continue to complain of bilateral lower extremity pain and weakness.  Differential diagnosis includes residual stroke deficit, deconditioning, statin myalgia or arthritis.    PLAN: -Continue aspirin 81 mg daily for secondary stroke prevention -Agreed upon to stop Lipitor 80 mg for 4 days to trial if bilateral lower extremity pain due to statin use.  Advised that if after 4 days, the pain does not subside she is to restart Lipitor 80  mg.  If pain does subside, we will consider starting Crestor and/or Zetia for HLD management with continued PCP follow-up for lipid panel levels -F/u with PCP regarding your HLD, HTN and DM management -advised patient to speak to PCP  regarding blood pressure management as blood pressure continues to be elevated -continue to monitor BP at home -Continue exercise as tolerated and healthy diet -information regarding healthy diet for cholesterol management provided -f/u with Dr. Rexene Alberts on 08/21/2018 for OSA work-up -Maintain strict control of hypertension with blood pressure goal below 130/90, diabetes with hemoglobin A1c goal below 6.5% and cholesterol with LDL cholesterol (bad cholesterol) goal below 70 mg/dL. I also advised the patient to eat a healthy diet with plenty of whole grains, cereals, fruits and vegetables, exercise regularly and maintain ideal body weight.  Follow up in 6 months or call earlier if needed   Greater than 50% of time during this 25 minute visit was spent on counseling,explanation of diagnosis of right CR infarct, reviewing risk factor management of HLD, HTN and DM, planning of further management, discussion with patient and family and coordination of care    Venancio Poisson, [facility]  [facility] [address] [place], [place] [address]  Phone [phone] Fax [phone]

## 2018-07-19 ENCOUNTER — Encounter: Payer: Self-pay | Admitting: Adult Health

## 2018-07-19 ENCOUNTER — Ambulatory Visit: Payer: Medicare HMO | Admitting: Adult Health

## 2018-07-19 VITALS — BP 180/98 | HR 88 | Ht 67.0 in | Wt 256.0 lb

## 2018-07-19 DIAGNOSIS — E785 Hyperlipidemia, unspecified: Secondary | ICD-10-CM | POA: Diagnosis not present

## 2018-07-19 DIAGNOSIS — E119 Type 2 diabetes mellitus without complications: Secondary | ICD-10-CM

## 2018-07-19 DIAGNOSIS — I63511 Cerebral infarction due to unspecified occlusion or stenosis of right middle cerebral artery: Secondary | ICD-10-CM | POA: Diagnosis not present

## 2018-07-19 DIAGNOSIS — I10 Essential (primary) hypertension: Secondary | ICD-10-CM

## 2018-07-19 NOTE — Patient Instructions (Addendum)
Continue aspirin 81 mg daily for secondary stroke prevention  Continue to follow up with PCP regarding cholesterol, blood pressure and diabetes management   Stop taking lipitor at this time to see if muscle aches and pains subside. After Monday, if they subside, we will consider starting on Crestor and/or Zetia. If they do not subside, then restart lipitor 80mg  at that time as your pains may not be related to statin US (Lipitor)   Continue to monitor blood pressure at home  Try to stay active as tolerated and maintain healthy diet  Maintain strict control of hypertension with blood pressure goal below 130/90, diabetes with hemoglobin A1c goal below 6.5% and cholesterol with LDL cholesterol (bad cholesterol) goal below 70 mg/dL. I also advised the patient to eat a healthy diet with plenty of whole grains, cereals, fruits and vegetables, exercise regularly and maintain ideal body weight.  Followup in the future with me in 6 months or call earlier if needed        Thank you for coming to see [place] at [facility]. I hope we have been able to provide you high quality care today.  You may receive a patient satisfaction survey over the next few weeks. We would appreciate your feedback and comments so that we may continue to improve ourselves and the health of our patients.

## 2018-07-24 DIAGNOSIS — E119 Type 2 diabetes mellitus without complications: Secondary | ICD-10-CM | POA: Diagnosis not present

## 2018-07-24 LAB — HM DIABETES EYE EXAM

## 2018-08-15 ENCOUNTER — Encounter (HOSPITAL_COMMUNITY): Payer: Medicare HMO

## 2018-08-15 ENCOUNTER — Other Ambulatory Visit (HOSPITAL_COMMUNITY): Payer: Medicare HMO

## 2018-08-15 ENCOUNTER — Ambulatory Visit: Payer: Medicare HMO | Admitting: Vascular Surgery

## 2018-08-21 ENCOUNTER — Institutional Professional Consult (permissible substitution): Payer: Medicare HMO | Admitting: Neurology

## 2018-08-24 ENCOUNTER — Ambulatory Visit: Payer: Medicare HMO | Admitting: Vascular Surgery

## 2018-08-27 ENCOUNTER — Other Ambulatory Visit: Payer: Self-pay | Admitting: Family Medicine

## 2018-08-27 NOTE — Telephone Encounter (Signed)
Copied from [place] [id number]. Topic: Quick Communication - Rx Refill/Question >> Aug 27, 2018 10:56 AM Alicia [name] wrote: Medication: NIFEdipine (PROCARDIA-XL/ADALAT CC) 60 MG 24 hr tablet  Has the patient contacted their pharmacy? Yes.  Went to pharmacy last Wednesday (Agent: If no, request that the patient contact the pharmacy for the refill.) (Agent: If yes, when and what did the pharmacy advise?) they said Saturday they faxed over a request haven't heard anything from provider  Preferred Pharmacy (with phone number or street name):     [facility] ([address]), [place] [phone] (Phone) [phone] (Fax)    Agent: Please be advised that RX refills may take up to 3 business days. We ask that you follow-up with your pharmacy.

## 2018-08-28 MED ORDER — NIFEDIPINE ER 60 MG PO TB24: 60.0000 mg | ORAL_TABLET | Freq: Every day | ORAL | 3 refills | Status: DC

## 2018-09-21 ENCOUNTER — Ambulatory Visit: Payer: Medicare HMO | Admitting: Family Medicine

## 2018-10-09 ENCOUNTER — Encounter: Payer: Self-pay | Admitting: Family Medicine

## 2018-10-09 ENCOUNTER — Other Ambulatory Visit: Payer: Self-pay

## 2018-10-09 ENCOUNTER — Ambulatory Visit (INDEPENDENT_AMBULATORY_CARE_PROVIDER_SITE_OTHER): Payer: Medicare HMO | Admitting: Family Medicine

## 2018-10-09 VITALS — BP 180/100 | HR 115 | Temp 98.2°F | Ht 67.0 in | Wt 253.4 lb

## 2018-10-09 DIAGNOSIS — E1169 Type 2 diabetes mellitus with other specified complication: Secondary | ICD-10-CM | POA: Diagnosis not present

## 2018-10-09 DIAGNOSIS — I6930 Unspecified sequelae of cerebral infarction: Secondary | ICD-10-CM | POA: Diagnosis not present

## 2018-10-09 DIAGNOSIS — E669 Obesity, unspecified: Secondary | ICD-10-CM | POA: Diagnosis not present

## 2018-10-09 DIAGNOSIS — E785 Hyperlipidemia, unspecified: Secondary | ICD-10-CM

## 2018-10-09 DIAGNOSIS — R42 Dizziness and giddiness: Secondary | ICD-10-CM | POA: Diagnosis not present

## 2018-10-09 DIAGNOSIS — I639 Cerebral infarction, unspecified: Secondary | ICD-10-CM

## 2018-10-09 DIAGNOSIS — I10 Essential (primary) hypertension: Secondary | ICD-10-CM | POA: Diagnosis not present

## 2018-10-09 MED ORDER — ATORVASTATIN CALCIUM 80 MG PO TABS: 80.0000 mg | ORAL_TABLET | Freq: Every day | ORAL | 3 refills | Status: DC

## 2018-10-09 MED ORDER — SPIRONOLACTONE 25 MG PO TABS: 25.0000 mg | ORAL_TABLET | Freq: Every day | ORAL | 1 refills | Status: DC

## 2018-10-09 MED ORDER — NIFEDIPINE ER 60 MG PO TB24: 60.0000 mg | ORAL_TABLET | Freq: Every day | ORAL | 3 refills | Status: DC

## 2018-10-09 NOTE — Patient Instructions (Addendum)
  AM - spironolactone and metoprolol PM - nifedepine and enalapril   If you have lab work done today you will be contacted with your lab results within the next 2 weeks.  If you have not heard from [place] then please contact us. The fastest way to get your results is to register for My Chart.   IF you received an x-ray today, you will receive an invoice from [facility]. Please contact [facility] at [phone] with questions or concerns regarding your invoice.   IF you received labwork today, you will receive an invoice from [facility]. Please contact [facility] at [phone] with questions or concerns regarding your invoice.   Our billing staff will not be able to assist you with questions regarding bills from these companies.  You will be contacted with the lab results as soon as they are available. The fastest way to get your results is to activate your My Chart account. Instructions are located on the last page of this paperwork. If you have not heard from [place] regarding the results in 2 weeks, please contact this office.

## 2018-10-09 NOTE — Progress Notes (Signed)
11/19/201912:22 PM  Alicia [name] [birth year], 67 y.o. female [id number]  Chief Complaint  Patient presents with  . Dizziness    going on a while  . Diabetes    3 f.u   . Hypertension    HPI:   Patient is a 67 y.o. female with past medical history significant for HTN, DM2, HLP, PAD w hx L fem-pop bypass, CVA, dCHF, smoker who presents today for routine followup  Last OV aug 2019 Chronic Overall dizzy, lightheaded, weak, sometimes room spin When she takes her BP meds she gets more lightheaded Reports lightheaded started before stroke Afraid she will fall Has not seen ENT, neuro not commenting on vertigo Uses cane or walker Not taking metoprolol or spironolactone She is taking nefedepine and enalarpril Cant take nifedepine 90mg  at once because she starts having nose bleeds  Lab Results  Component Value Date   HGBA1C 7.8 (H) 06/21/2018   HGBA1C 7.7 (H) 03/16/2018   HGBA1C (H) 02/25/2011    7.8 (NOTE)                                                                       According to the ADA Clinical Practice Recommendations for 2011, when HbA1c is used as a screening test:   >=6.5%   Diagnostic of Diabetes Mellitus           (if abnormal result  is confirmed)  5.7-6.4%   Increased risk of developing Diabetes Mellitus  References:Diagnosis and Classification of Diabetes Mellitus,Diabetes TMAU,6333,54(TGYBW 1):S62-S69 and Standards of Medical Care in         Diabetes - 2011,Diabetes Care,2011,34  (Suppl 1):S11-S61.   Lab Results  Component Value Date   LDLCALC 101 (H) 05/28/2018   CREATININE 1.15 (H) 06/14/2018    Fall Risk  10/09/2018 07/19/2018 06/21/2018 04/06/2018  Falls in the past year? 0 No No No     Depression screen [facility] 2/9 10/09/2018 06/21/2018 04/06/2018  Decreased Interest 0 0 0  Down, Depressed, Hopeless 0 0 0  PHQ - 2 Score 0 0 0    Allergies  Allergen Reactions  . Nifedipine Er Other (See Comments)    Caused nose bleeds in higher doses (90 mg.,  namely)  . Latex Rash    Prior to Admission medications   Medication Sig Start Date End Date Taking? Authorizing Provider  acetaminophen (TYLENOL) 500 MG tablet Take 500-1,000 mg by mouth every 6 (six) hours as needed (for pain).   Yes [provider]  aspirin 81 MG EC tablet Take 1 tablet (81 mg total) by mouth daily. 04/06/18  Yes Rutherford Guys, MD  enalapril (VASOTEC) 20 MG tablet TAKE 1 TABLET BY MOUTH TWICE DAILY 07/13/18  Yes Rutherford Guys, MD  fluticasone [facility]) 50 MCG/ACT nasal spray Place 1 spray into both nostrils 2 (two) times daily. 06/21/18  Yes Rutherford Guys, MD  metFORMIN (GLUCOPHAGE) 1000 MG tablet Take 1 tablet (1,000 mg total) by mouth 2 (two) times daily with a meal. 04/06/18  Yes Rutherford Guys, MD  atorvastatin (LIPITOR) 80 MG tablet Take 1 tablet (80 mg total) by mouth daily at 6 PM. Patient not taking: Reported on 10/09/2018 05/04/18   Rutherford Guys, MD  metoprolol succinate (  TOPROL-XL) 50 MG 24 hr tablet take 50mg  by mouth once daily 04/26/18   [provider]  NIFEdipine (PROCARDIA-XL/ADALAT CC) 60 MG 24 hr tablet Take 1 tablet (60 mg total) by mouth daily. Patient not taking: Reported on 10/09/2018 08/28/18   Rutherford Guys, MD  spironolactone (ALDACTONE) 25 MG tablet Take 1 tablet (25 mg total) by mouth daily. Patient not taking: Reported on 10/09/2018 06/21/18   Rutherford Guys, MD    Past Medical History:  Diagnosis Date  . Arthritis   . Chronic headaches   . Constipation   . Depression   . Diabetes mellitus   . Hyperlipidemia   . Hypertension   . Obesity   . PAD (peripheral artery disease) ([place])   . Shortness of breath   . Stroke [facility])     Past Surgical History:  Procedure Laterality Date  . FEMORAL BYPASS  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 Bridgett Larsson    Social History   Tobacco Use  . Smoking status: Light Tobacco Smoker    Packs/day: 0.50    Years: 35.00    Pack years: 17.50     Types: Cigarettes  . Smokeless tobacco: Never Used  . Tobacco comment: patient is trying to quit and has gone down to half a pack per day  Substance Use Topics  . Alcohol use: Yes    Alcohol/week: 1.0 standard drinks    Types: 1 Standard drinks or equivalent per week    Comment: occasional use only    Family History  Problem Relation Age of Onset  . Diabetes Father   . Heart disease Mother        NOT before age 75-  Bypass  . Hypertension Mother   . Hypertension Sister   . Varicose Veins Brother   . Heart disease Brother        Before age 63  . Hypertension Daughter     Review of Systems  Constitutional: Negative for chills and fever.  Respiratory: Negative for cough and shortness of breath.   Cardiovascular: Negative for chest pain, palpitations and leg swelling.  Gastrointestinal: Negative for abdominal pain, nausea and vomiting.   Per hpi  OBJECTIVE:  Blood pressure (!) 180/100, pulse (!) 115, temperature 98.2 F (36.8 C), temperature source Oral, height 5\' 7"  (1.702 m), weight 253 lb 6.4 oz (114.9 kg), SpO2 91 %. Body mass index is 39.69 kg/m.     BP Readings from Last 3 Encounters:  10/09/18 (!) 180/100  07/19/18 (!) 180/98  06/21/18 (!) 148/88    Physical Exam  Constitutional: She is oriented to person, place, and time. She appears well-developed and well-nourished.  HENT:  Head: Normocephalic and atraumatic.  Mouth/Throat: Oropharynx is clear and moist. No oropharyngeal exudate.  Eyes: Pupils are equal, round, and reactive to light. Conjunctivae and EOM are normal. No scleral icterus.  Neck: Neck supple.  Cardiovascular: Normal rate, regular rhythm and normal heart sounds. Exam reveals no gallop and no friction rub.  No murmur heard. Pulmonary/Chest: Effort normal and breath sounds normal. She has no wheezes. She has no rales.  Musculoskeletal: She exhibits no edema.  Neurological: She is alert and oriented to person, place, and time.  Skin: Skin is  warm and dry.  Psychiatric: She has a normal mood and affect.  Nursing note and vitals reviewed.  ASSESSMENT and PLAN  1. Vertigo - Ambulatory referral to ENT  2. Essential hypertension, benign  3. Cerebrovascular  accident (CVA), unspecified mechanism ([place])  4. Hyperlipidemia LDL goal <70  5. Diabetes mellitus type 2 in obese (HCC) - Hemoglobin A1c - Comprehensive metabolic panel  Had long conversation with patient regarding risks associated with uncontrolled BP. Discussed importance of finding regime that works for her. She was doing well prior. Discussed spacing medications out. Discussed slowly reintroducing meds so that she can monitor sx. Also discussed ENT eval for vertigo with possible vestibular rehab.  Also discussed working on other RF for ASCVD, discussed atorvastatin would not be contributing to sx of concerns. Discussed importance of good glucose control. Checking labs today, medications will be adjusted as needed.   Other orders - atorvastatin (LIPITOR) 80 MG tablet; Take 1 tablet (80 mg total) by mouth daily at 6 PM. - spironolactone (ALDACTONE) 25 MG tablet; Take 1 tablet (25 mg total) by mouth daily. - NIFEdipine (ADALAT CC) 60 MG 24 hr tablet; Take 1 tablet (60 mg total) by mouth daily.  Return in about 4 weeks (around 11/06/2018). BP/vertigo    Rutherford Guys, MD Primary Care at [place], [place] [address] Ph.  [phone] Fax [phone]

## 2018-10-10 LAB — HEMOGLOBIN A1C
Est. average glucose Bld gHb Est-mCnc: 157 mg/dL
Hgb A1c MFr Bld: 7.1 % — ABNORMAL HIGH (ref 4.8–5.6)

## 2018-10-10 LAB — COMPREHENSIVE METABOLIC PANEL
ALT: 8 IU/L (ref 0–32)
AST: 10 IU/L (ref 0–40)
Albumin/Globulin Ratio: 1.6 (ref 1.2–2.2)
Albumin: 4.2 g/dL (ref 3.6–4.8)
Alkaline Phosphatase: 114 IU/L (ref 39–117)
BUN/Creatinine Ratio: 14 (ref 12–28)
BUN: 18 mg/dL (ref 8–27)
Bilirubin Total: 0.3 mg/dL (ref 0.0–1.2)
CO2: 21 mmol/L (ref 20–29)
Calcium: 9.3 mg/dL (ref 8.7–10.3)
Chloride: 101 mmol/L (ref 96–106)
Creatinine, Ser: 1.31 mg/dL — ABNORMAL HIGH (ref 0.57–1.00)
GFR calc Af Amer: 49 mL/min/{1.73_m2} — ABNORMAL LOW (ref >59)
GFR calc non Af Amer: 42 mL/min/{1.73_m2} — ABNORMAL LOW (ref >59)
Globulin, Total: 2.7 g/dL (ref 1.5–4.5)
Glucose: 134 mg/dL — ABNORMAL HIGH (ref 65–99)
Potassium: 4.2 mmol/L (ref 3.5–5.2)
Sodium: 141 mmol/L (ref 134–144)
Total Protein: 6.9 g/dL (ref 6.0–8.5)

## 2018-10-12 ENCOUNTER — Ambulatory Visit: Payer: Medicare HMO | Admitting: Vascular Surgery

## 2018-10-12 ENCOUNTER — Ambulatory Visit (HOSPITAL_COMMUNITY)
Admission: RE | Admit: 2018-10-12 | Discharge: 2018-10-12 | Disposition: A | Discharge status: Home / Self Care | Payer: Medicare HMO | Source: Ambulatory Visit | Attending: Family | Admitting: Family

## 2018-10-12 ENCOUNTER — Ambulatory Visit (INDEPENDENT_AMBULATORY_CARE_PROVIDER_SITE_OTHER)
Admission: RE | Admit: 2018-10-12 | Discharge: 2018-10-12 | Disposition: A | Discharge status: Home / Self Care | Payer: Medicare HMO | Source: Ambulatory Visit | Attending: Family | Admitting: Family

## 2018-10-12 DIAGNOSIS — I739 Peripheral vascular disease, unspecified: Secondary | ICD-10-CM

## 2018-10-16 ENCOUNTER — Encounter: Payer: Self-pay | Admitting: *Deleted

## 2018-11-02 ENCOUNTER — Encounter: Payer: Self-pay | Admitting: *Deleted

## 2018-11-02 ENCOUNTER — Encounter: Payer: Self-pay | Admitting: Vascular Surgery

## 2018-11-02 ENCOUNTER — Ambulatory Visit (INDEPENDENT_AMBULATORY_CARE_PROVIDER_SITE_OTHER): Payer: Medicare HMO | Admitting: Vascular Surgery

## 2018-11-02 VITALS — BP 187/97 | HR 91 | Resp 18 | Ht 67.0 in | Wt 253.0 lb

## 2018-11-02 DIAGNOSIS — F17200 Nicotine dependence, unspecified, uncomplicated: Secondary | ICD-10-CM | POA: Diagnosis not present

## 2018-11-02 DIAGNOSIS — I10 Essential (primary) hypertension: Secondary | ICD-10-CM | POA: Diagnosis not present

## 2018-11-02 DIAGNOSIS — I739 Peripheral vascular disease, unspecified: Secondary | ICD-10-CM | POA: Diagnosis not present

## 2018-11-02 DIAGNOSIS — Z95828 Presence of other vascular implants and grafts: Secondary | ICD-10-CM | POA: Diagnosis not present

## 2018-11-02 NOTE — Progress Notes (Signed)
Patient ID: [name], female   DOB: December 26, 1950, 67 y.o.   MRN: [id number]  Reason for Consult: Follow-up (6 month f/u )   Referred by Alicia Beard, MD  Subjective:     HPI:  Alicia [name] is a 67 y.o. female with history of left femoral to popliteal artery bypass with vein.  She does take aspirin daily.  She was recently on Plavix as well for a mild stroke that she had.  At that time she had CT Angie of her neck which did not demonstrate any stenosis.  She is previously from [place] having worked for the [employer] but has been in [place] for 12 years.  Only lower extremity complaints at this time is left lower extremity swelling which has been worsening.  She only has mild swelling on the right.  She does not have tissue loss or ulceration.  She does walk with the help of a cane.  She denies any claudication.  Past Medical History:  Diagnosis Date  . Arthritis   . Chronic headaches   . Constipation   . Depression   . Diabetes mellitus   . Hyperlipidemia   . Hypertension   . Obesity   . PAD (peripheral artery disease) ([place])   . Shortness of breath   . Stroke [facility])    Family History  Problem Relation Age of Onset  . Diabetes Father   . Heart disease Mother        NOT before age 26-  Bypass  . Hypertension Mother   . Hypertension Sister   . Varicose Veins Brother   . Heart disease Brother        Before age 80  . Hypertension Daughter    Past Surgical History:  Procedure Laterality Date  . FEMORAL BYPASS  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 Kathreen [name] Social History:  Social History   Tobacco Use  . Smoking status: Light Tobacco Smoker    Packs/day: 0.50    Years: 35.00    Pack years: 17.50    Types: Cigarettes  . Smokeless tobacco: Never Used  . Tobacco comment: patient is trying to quit and has gone down to half a pack per day  Substance Use Topics  . Alcohol use: Yes    Alcohol/week: 1.0 standard drinks      Types: 1 Standard drinks or equivalent per week    Comment: occasional use only    Allergies  Allergen Reactions  . Nifedipine Er Other (See Comments)    Caused nose bleeds in higher doses (90 mg., namely)  . Latex Rash    Current Outpatient Medications  Medication Sig Dispense Refill  . acetaminophen (TYLENOL) 500 MG tablet Take 500-1,000 mg by mouth every 6 (six) hours as needed (for pain).    Marland Kitchen aspirin 81 MG EC tablet Take 1 tablet (81 mg total) by mouth daily. 30 tablet 0  . atorvastatin (LIPITOR) 80 MG tablet Take 1 tablet (80 mg total) by mouth daily at 6 PM. 90 tablet 3  . enalapril (VASOTEC) 20 MG tablet TAKE 1 TABLET BY MOUTH TWICE DAILY 60 tablet 2  . fluticasone (FLONASE) 50 MCG/ACT nasal spray Place 1 spray into both nostrils 2 (two) times daily. 16 g 6  . metFORMIN (GLUCOPHAGE) 1000 MG tablet Take 1 tablet (1,000 mg total) by mouth 2 (two) times daily with a meal. 60 tablet 2  . metoprolol succinate (TOPROL-XL) 50  MG 24 hr tablet take 50mg  by mouth once daily  0  . NIFEdipine (ADALAT CC) 60 MG 24 hr tablet Take 1 tablet (60 mg total) by mouth daily. 30 tablet 3  . spironolactone (ALDACTONE) 25 MG tablet Take 1 tablet (25 mg total) by mouth daily. 90 tablet 1   No current facility-administered medications for this visit.     Review of Systems  Constitutional:  Constitutional negative. HENT: HENT negative.  Eyes: Eyes negative.  Respiratory: Respiratory negative.  Cardiovascular: Positive for leg swelling.  GI: Gastrointestinal negative.  Musculoskeletal: Positive for leg pain.  Neurological: Neurological negative. Hematologic: Hematologic/lymphatic negative.  Psychiatric: Psychiatric negative.        Objective:  Objective   Vitals:   11/02/18 1048  BP: (!) 187/97  Pulse: 91  Resp: 18  SpO2: 98%  Weight: 253 lb (114.8 kg)  Height: 5\' 7"  (1.702 m)   Body mass index is 39.63 kg/m.  Physical Exam Eyes:     Pupils: Pupils are equal, round, and  reactive to light.  Neck:     Musculoskeletal: Neck supple.  Cardiovascular:     Rate and Rhythm: Normal rate.  Pulmonary:     Effort: Pulmonary effort is normal.  Abdominal:     General: Abdomen is flat.     Palpations: Abdomen is soft.  Musculoskeletal: Normal range of motion.     Right lower leg: No edema.     Left lower leg: Edema present.  Skin:    General: Skin is warm and dry.     Capillary Refill: Capillary refill takes less than 2 seconds.  Neurological:     General: No focal deficit present.     Mental Status: She is alert.  Psychiatric:        Mood and Affect: Mood normal.     Data: I independently interpreted her left lower extremity bypass graft duplex which demonstrates proximal anastomotic velocities 223 cm/s distally down to 31 cm/s.    ABIs are 0.66 right and 0.8 left     Assessment/Plan:     67 year old female with previous left femoral-popliteal bypass for claudication.  Her only lower extremity complaint this time is swelling.  She takes aspirin and a statin.  She previously was on Plavix for mild stroke.  Did not have any carotid stenosis on recent CT Angio of her neck that was performed this year.  Does not need follow-up for this.  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  We will plan for angiogram possible intervention on the bypass graft.  I discussed with her the risk benefits alternatives and she agrees to proceed in the near future.  She did have a creatinine of 1.3 recently so we will need to possibly use CO2 versus limiting her contrast     Alicia Sandy MD [facility]

## 2018-11-08 ENCOUNTER — Other Ambulatory Visit: Payer: Self-pay | Admitting: Family Medicine

## 2018-11-08 NOTE — Telephone Encounter (Signed)
Requested medication (s) are due for refill today: yes  Requested medication (s) are on the active medication list: yes  Last refill:  07/13/18 for 60 and 2 refills  Future visit scheduled: yes  Notes to clinic:  3 month supply given  Requested Prescriptions  Pending Prescriptions Disp Refills   enalapril (VASOTEC) 20 MG tablet [Pharmacy Med Name: Enalapril Maleate 20 MG Oral Tablet] 180 tablet 0    Sig: TAKE 1 TABLET BY MOUTH TWICE DAILY     Cardiovascular:  ACE Inhibitors Failed - 11/08/2018  9:10 AM      Failed - Cr in normal range and within 180 days    Creatinine, Ser  Date Value Ref Range Status  10/09/2018 1.31 (H) 0.57 - 1.00 mg/dL Final         Failed - Last BP in normal range    BP Readings from Last 1 Encounters:  11/02/18 (!) 187/97         Passed - K in normal range and within 180 days    Potassium  Date Value Ref Range Status  10/09/2018 4.2 3.5 - 5.2 mmol/L Final         Passed - Patient is not pregnant      Passed - Valid encounter within last 6 months    Recent Outpatient Visits          1 month ago Vertigo   [facility], Lilia Argue, MD   4 months ago Diabetes mellitus type 2 in obese [facility])   [facility], Lilia Argue, MD   5 months ago Cerebrovascular accident (CVA), unspecified mechanism [facility])   [facility], Lilia Argue, MD   6 months ago Essential hypertension, benign   [facility], Lilia Argue, MD   7 months ago Essential hypertension, benign   [facility], Lilia Argue, MD      Future Appointments            In 4 days Rutherford Guys, MD Primary Care at [place], [facility]

## 2018-11-08 NOTE — Telephone Encounter (Signed)
Refilled for a 3 month supply NOV  11/12/18 Requested Prescriptions  Pending Prescriptions Disp Refills  . enalapril (VASOTEC) 20 MG tablet [Pharmacy Med Name: Enalapril Maleate 20 MG Oral Tablet] 180 tablet 0    Sig: TAKE 1 TABLET BY MOUTH TWICE DAILY     Cardiovascular:  ACE Inhibitors Failed - 11/08/2018  9:10 AM      Failed - Cr in normal range and within 180 days    Creatinine, Ser  Date Value Ref Range Status  10/09/2018 1.31 (H) 0.57 - 1.00 mg/dL Final         Failed - Last BP in normal range    BP Readings from Last 1 Encounters:  11/02/18 (!) 187/97         Passed - K in normal range and within 180 days    Potassium  Date Value Ref Range Status  10/09/2018 4.2 3.5 - 5.2 mmol/L Final         Passed - Patient is not pregnant      Passed - Valid encounter within last 6 months    Recent Outpatient Visits          1 month ago Vertigo   [facility], Lilia Argue, MD   4 months ago Diabetes mellitus type 2 in obese [facility])   [facility], Lilia Argue, MD   5 months ago Cerebrovascular accident (CVA), unspecified mechanism [facility])   [facility], Lilia Argue, MD   6 months ago Essential hypertension, benign   [facility], Lilia Argue, MD   7 months ago Essential hypertension, benign   [facility], Lilia Argue, MD      Future Appointments            In 4 days Rutherford Guys, MD Primary Care at [place], [facility]

## 2018-11-12 ENCOUNTER — Other Ambulatory Visit: Payer: Self-pay

## 2018-11-12 ENCOUNTER — Encounter: Payer: Self-pay | Admitting: Family Medicine

## 2018-11-12 ENCOUNTER — Ambulatory Visit (INDEPENDENT_AMBULATORY_CARE_PROVIDER_SITE_OTHER): Payer: Medicare HMO | Admitting: Family Medicine

## 2018-11-12 VITALS — BP 148/84 | HR 100 | Temp 98.4°F | Ht 67.0 in | Wt 252.8 lb

## 2018-11-12 DIAGNOSIS — E1169 Type 2 diabetes mellitus with other specified complication: Secondary | ICD-10-CM | POA: Diagnosis not present

## 2018-11-12 DIAGNOSIS — E669 Obesity, unspecified: Secondary | ICD-10-CM

## 2018-11-12 DIAGNOSIS — I10 Essential (primary) hypertension: Secondary | ICD-10-CM

## 2018-11-12 DIAGNOSIS — Z23 Encounter for immunization: Secondary | ICD-10-CM | POA: Diagnosis not present

## 2018-11-12 DIAGNOSIS — R42 Dizziness and giddiness: Secondary | ICD-10-CM | POA: Diagnosis not present

## 2018-11-12 NOTE — Patient Instructions (Addendum)
  Take metoprolol at bedtime    If you have lab work done today you will be contacted with your lab results within the next 2 weeks.  If you have not heard from [place] then please contact us. The fastest way to get your results is to register for My Chart.   IF you received an x-ray today, you will receive an invoice from [facility]. Please contact [facility] at [phone] with questions or concerns regarding your invoice.   IF you received labwork today, you will receive an invoice from [place]. Please contact [facility] at [phone] with questions or concerns regarding your invoice.   Our billing staff will not be able to assist you with questions regarding bills from these companies.  You will be contacted with the lab results as soon as they are available. The fastest way to get your results is to activate your My Chart account. Instructions are located on the last page of this paperwork. If you have not heard from [place] regarding the results in 2 weeks, please contact this office.

## 2018-11-12 NOTE — Progress Notes (Signed)
12/23/201910:23 AM  Alicia [name] October 03, 1951, 67 y.o. female [id number]  Chief Complaint  Patient presents with  . Dizziness    Follow up in April, needs faster appt. Has form for adjustments needed for bathroom     HPI:   Patient is a 67 y.o. female with past medical history significant for HTN, DM2, HLP, PAD w hx L fem-pop bypass, CVA, dCHF, smokerwho presents today for routine followup  Last OV nov 2019  Uses cane Recurring vertigo ENT has scheduled her for April, would like something closer Separating medications out has helped some with dizziness Taking enalapril 20mg  twice a day, metoprolol XL 50mg  tends to take once back home as it makes her dizzier, has not had today, nifedepine ER 60mg  in the morning, taking spironolactone occasionally, afraid of her having to go to the restroom  Brings in forms for accommodations in the restroom, raised toilet and grab bar for the shower  No falls of recent  Saw vasc surg dec 2019 - left fem-pop bypass - has stenosis of graft, concern for failing, plans for angiogram She has restarted atorvastatin, tolerating well  She will call in with name of glucometer as she needs test strips Has not been doing her aqua aerobics  Lab Results  Component Value Date   HGBA1C 7.1 (H) 10/09/2018   HGBA1C 7.8 (H) 06/21/2018   HGBA1C 7.7 (H) 03/16/2018   Lab Results  Component Value Date   LDLCALC 101 (H) 05/28/2018   CREATININE 1.31 (H) 10/09/2018    Fall Risk  11/12/2018 10/09/2018 07/19/2018 06/21/2018 04/06/2018  Falls in the past year? 0 0 No No No     Depression screen [facility] 2/9 11/12/2018 10/09/2018 06/21/2018  Decreased Interest 0 0 0  Down, Depressed, Hopeless 0 0 0  PHQ - 2 Score 0 0 0    Allergies  Allergen Reactions  . Nifedipine Er Other (See Comments)    Caused nose bleeds in higher doses (90 mg., namely)  . Latex Rash    Prior to Admission medications   Medication Sig Start Date End Date Taking? Authorizing  Provider  acetaminophen (TYLENOL) 500 MG tablet Take 500-1,000 mg by mouth every 6 (six) hours as needed (for pain).   Yes [provider]  aspirin 81 MG EC tablet Take 1 tablet (81 mg total) by mouth daily. 04/06/18  Yes Rutherford Guys, MD  atorvastatin (LIPITOR) 80 MG tablet Take 1 tablet (80 mg total) by mouth daily at 6 PM. 10/09/18  Yes Rutherford Guys, MD  enalapril (VASOTEC) 20 MG tablet TAKE 1 TABLET BY MOUTH TWICE DAILY 11/08/18  Yes Rutherford Guys, MD  fluticasone [facility]) 50 MCG/ACT nasal spray Place 1 spray into both nostrils 2 (two) times daily. 06/21/18  Yes Rutherford Guys, MD  metFORMIN (GLUCOPHAGE) 1000 MG tablet Take 1 tablet (1,000 mg total) by mouth 2 (two) times daily with a meal. 04/06/18  Yes Rutherford Guys, MD  metoprolol succinate (TOPROL-XL) 50 MG 24 hr tablet take 50mg  by mouth once daily 04/26/18  Yes [provider]  NIFEdipine (ADALAT CC) 60 MG 24 hr tablet Take 1 tablet (60 mg total) by mouth daily. 10/09/18  Yes Rutherford Guys, MD  spironolactone (ALDACTONE) 25 MG tablet Take 1 tablet (25 mg total) by mouth daily. 10/09/18  Yes Rutherford Guys, MD    Past Medical History:  Diagnosis Date  . Arthritis   . Chronic headaches   . Constipation   . Depression   .  Diabetes mellitus   . Hyperlipidemia   . Hypertension   . Obesity   . PAD (peripheral artery disease) ([place])   . Shortness of breath   . Stroke [facility])     Past Surgical History:  Procedure Laterality Date  . FEMORAL BYPASS  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 Bridgett Larsson    Social History   Tobacco Use  . Smoking status: Light Tobacco Smoker    Packs/day: 0.50    Years: 35.00    Pack years: 17.50    Types: Cigarettes  . Smokeless tobacco: Never Used  . Tobacco comment: patient is trying to quit and has gone down to half a pack per day  Substance Use Topics  . Alcohol use: Yes    Alcohol/week: 1.0 standard drinks    Types: 1 Standard  drinks or equivalent per week    Comment: occasional use only    Family History  Problem Relation Age of Onset  . Diabetes Father   . Heart disease Mother        NOT before age 67-  Bypass  . Hypertension Mother   . Hypertension Sister   . Varicose Veins Brother   . Heart disease Brother        Before age 38  . Hypertension Daughter     Review of Systems  Constitutional: Negative for chills and fever.  Respiratory: Negative for cough and shortness of breath.   Cardiovascular: Positive for leg swelling (left). Negative for chest pain and palpitations.  Gastrointestinal: Negative for abdominal pain, nausea and vomiting.  Neurological: Positive for dizziness.     OBJECTIVE:  Blood pressure (!) 148/84, pulse 100, temperature 98.4 F (36.9 C), temperature source Oral, height 5\' 7"  (1.702 m), weight 252 lb 12.8 oz (114.7 kg), SpO2 94 %. Body mass index is 39.59 kg/m.   Physical Exam Vitals signs and nursing note reviewed.  Constitutional:      Appearance: She is well-developed.  HENT:     Head: Normocephalic and atraumatic.     Mouth/Throat:     Pharynx: No oropharyngeal exudate.  Eyes:     General: No scleral icterus.    Conjunctiva/sclera: Conjunctivae normal.     Pupils: Pupils are equal, round, and reactive to light.  Neck:     Musculoskeletal: Neck supple.  Cardiovascular:     Rate and Rhythm: Normal rate and regular rhythm.     Heart sounds: Murmur present. No friction rub. No gallop.   Pulmonary:     Effort: Pulmonary effort is normal.     Breath sounds: Normal breath sounds. No wheezing or rales.  Musculoskeletal:     Right lower leg: No edema.     Left lower leg: Edema present.  Skin:    General: Skin is warm and dry.  Neurological:     Mental Status: She is alert and oriented to person, place, and time.     ASSESSMENT and PLAN  1. Diabetes mellitus type 2 in obese (HCC) improved, cont working LFM - Microalbumin/Creatinine Ratio, Urine  2.  Essential hypertension, benign Improved. Discussed taking metoprolol at bedtime. Discussed spironolactone in further detail. Discussed importance of BP control  3. Vertigo Most likely from autonomic dysregulation, however never been worked up. Pending ent eval. In the meanwhile sending to PT for gait training - Ambulatory referral to Physical Therapy  Other orders - Pneumococcal conjugate vaccine 13-valent IM  Return in about 3 months (around 02/11/2019).  Deondria Puryear M Santiago, MD Primary Care at [place] [address] [place], [place] [address] Ph.  [phone] Fax [phone]   

## 2018-11-13 LAB — MICROALBUMIN / CREATININE URINE RATIO
Creatinine, Urine: 75.9 mg/dL
Microalb/Creat Ratio: 755.2 mg/g creat — ABNORMAL HIGH (ref 0.0–30.0)
Microalbumin, Urine: 573.2 ug/mL

## 2018-12-07 DIAGNOSIS — R42 Dizziness and giddiness: Secondary | ICD-10-CM | POA: Diagnosis not present

## 2018-12-07 DIAGNOSIS — H903 Sensorineural hearing loss, bilateral: Secondary | ICD-10-CM | POA: Diagnosis not present

## 2018-12-13 DIAGNOSIS — R42 Dizziness and giddiness: Secondary | ICD-10-CM | POA: Diagnosis not present

## 2018-12-17 ENCOUNTER — Ambulatory Visit (HOSPITAL_COMMUNITY)
Admission: RE | Disposition: A | Discharge status: Home / Self Care | Payer: Self-pay | Source: Ambulatory Visit | Attending: Vascular Surgery

## 2018-12-17 ENCOUNTER — Ambulatory Visit (HOSPITAL_COMMUNITY)
Admission: RE | Admit: 2018-12-17 | Discharge: 2018-12-17 | Disposition: A | Discharge status: Home / Self Care | Payer: Medicare HMO | Source: Ambulatory Visit | Attending: Vascular Surgery | Admitting: Vascular Surgery

## 2018-12-17 ENCOUNTER — Telehealth: Payer: Self-pay | Admitting: Vascular Surgery

## 2018-12-17 ENCOUNTER — Encounter (HOSPITAL_COMMUNITY): Payer: Self-pay | Admitting: Vascular Surgery

## 2018-12-17 ENCOUNTER — Other Ambulatory Visit: Payer: Self-pay

## 2018-12-17 DIAGNOSIS — Z6839 Body mass index (BMI) 39.0-39.9, adult: Secondary | ICD-10-CM | POA: Insufficient documentation

## 2018-12-17 DIAGNOSIS — Z79899 Other long term (current) drug therapy: Secondary | ICD-10-CM | POA: Insufficient documentation

## 2018-12-17 DIAGNOSIS — Z8673 Personal history of transient ischemic attack (TIA), and cerebral infarction without residual deficits: Secondary | ICD-10-CM | POA: Insufficient documentation

## 2018-12-17 DIAGNOSIS — M1990 Unspecified osteoarthritis, unspecified site: Secondary | ICD-10-CM | POA: Diagnosis not present

## 2018-12-17 DIAGNOSIS — Z833 Family history of diabetes mellitus: Secondary | ICD-10-CM | POA: Insufficient documentation

## 2018-12-17 DIAGNOSIS — Z7982 Long term (current) use of aspirin: Secondary | ICD-10-CM | POA: Insufficient documentation

## 2018-12-17 DIAGNOSIS — Z7902 Long term (current) use of antithrombotics/antiplatelets: Secondary | ICD-10-CM | POA: Insufficient documentation

## 2018-12-17 DIAGNOSIS — E1151 Type 2 diabetes mellitus with diabetic peripheral angiopathy without gangrene: Secondary | ICD-10-CM | POA: Diagnosis not present

## 2018-12-17 DIAGNOSIS — Z888 Allergy status to other drugs, medicaments and biological substances status: Secondary | ICD-10-CM | POA: Insufficient documentation

## 2018-12-17 DIAGNOSIS — Z8249 Family history of ischemic heart disease and other diseases of the circulatory system: Secondary | ICD-10-CM | POA: Diagnosis not present

## 2018-12-17 DIAGNOSIS — Z95820 Peripheral vascular angioplasty status with implants and grafts: Secondary | ICD-10-CM | POA: Diagnosis not present

## 2018-12-17 DIAGNOSIS — E785 Hyperlipidemia, unspecified: Secondary | ICD-10-CM | POA: Insufficient documentation

## 2018-12-17 DIAGNOSIS — I10 Essential (primary) hypertension: Secondary | ICD-10-CM | POA: Insufficient documentation

## 2018-12-17 DIAGNOSIS — Z7984 Long term (current) use of oral hypoglycemic drugs: Secondary | ICD-10-CM | POA: Diagnosis not present

## 2018-12-17 DIAGNOSIS — Z7951 Long term (current) use of inhaled steroids: Secondary | ICD-10-CM | POA: Insufficient documentation

## 2018-12-17 DIAGNOSIS — Z91040 Latex allergy status: Secondary | ICD-10-CM | POA: Diagnosis not present

## 2018-12-17 DIAGNOSIS — I70212 Atherosclerosis of native arteries of extremities with intermittent claudication, left leg: Secondary | ICD-10-CM | POA: Diagnosis not present

## 2018-12-17 DIAGNOSIS — F17210 Nicotine dependence, cigarettes, uncomplicated: Secondary | ICD-10-CM | POA: Diagnosis not present

## 2018-12-17 DIAGNOSIS — E669 Obesity, unspecified: Secondary | ICD-10-CM | POA: Diagnosis not present

## 2018-12-17 HISTORY — PX: PERIPHERAL VASCULAR ATHERECTOMY: CATH118256

## 2018-12-17 HISTORY — PX: ABDOMINAL AORTOGRAM W/LOWER EXTREMITY: CATH118223

## 2018-12-17 HISTORY — PX: PERIPHERAL VASCULAR BALLOON ANGIOPLASTY: CATH118281

## 2018-12-17 LAB — POCT I-STAT 4, (NA,K, GLUC, HGB,HCT)
Glucose, Bld: 142 mg/dL — ABNORMAL HIGH (ref 70–99)
HCT: 40 % (ref 36.0–46.0)
Hemoglobin: 13.6 g/dL (ref 12.0–15.0)
Potassium: 4.2 mmol/L (ref 3.5–5.1)
Sodium: 142 mmol/L (ref 135–145)

## 2018-12-17 LAB — GLUCOSE, CAPILLARY: Glucose-Capillary: 147 mg/dL — ABNORMAL HIGH (ref 70–99)

## 2018-12-17 LAB — POCT I-STAT CREATININE: Creatinine, Ser: 1.2 mg/dL — ABNORMAL HIGH (ref 0.44–1.00)

## 2018-12-17 SURGERY — ABDOMINAL AORTOGRAM W/LOWER EXTREMITY
Anesthesia: LOCAL | Site: Leg Lower

## 2018-12-17 MED ORDER — LABETALOL HCL 5 MG/ML IV SOLN
INTRAVENOUS | Status: AC
  Filled 2018-12-17: qty 4

## 2018-12-17 MED ORDER — HEPARIN SODIUM (PORCINE) 1000 UNIT/ML IJ SOLN
INTRAMUSCULAR | Status: AC
  Filled 2018-12-17: qty 1

## 2018-12-17 MED ORDER — SODIUM CHLORIDE 0.9% FLUSH: 3.0000 mL | Freq: Two times a day (BID) | INTRAVENOUS | Status: DC

## 2018-12-17 MED ORDER — MIDAZOLAM HCL 2 MG/2ML IJ SOLN
INTRAMUSCULAR | Status: DC | PRN
  Administered 2018-12-17 (×2): 1 mg via INTRAVENOUS

## 2018-12-17 MED ORDER — HEPARIN (PORCINE) IN NACL 1000-0.9 UT/500ML-% IV SOLN
INTRAVENOUS | Status: DC | PRN
  Administered 2018-12-17: 500 mL

## 2018-12-17 MED ORDER — HYDRALAZINE HCL 20 MG/ML IJ SOLN: 5.0000 mg | INTRAMUSCULAR | Status: DC | PRN

## 2018-12-17 MED ORDER — LABETALOL HCL 5 MG/ML IV SOLN
INTRAVENOUS | Status: DC | PRN
  Administered 2018-12-17: 10 mg via INTRAVENOUS

## 2018-12-17 MED ORDER — CLOPIDOGREL BISULFATE 300 MG PO TABS
ORAL_TABLET | ORAL | Status: AC
  Filled 2018-12-17: qty 2

## 2018-12-17 MED ORDER — NITROGLYCERIN IN D5W 200-5 MCG/ML-% IV SOLN
INTRAVENOUS | Status: AC
  Filled 2018-12-17: qty 250

## 2018-12-17 MED ORDER — SODIUM CHLORIDE 0.9 % WEIGHT BASED INFUSION: 1.0000 mL/kg/h | INTRAVENOUS | Status: DC

## 2018-12-17 MED ORDER — FENTANYL CITRATE (PF) 100 MCG/2ML IJ SOLN
INTRAMUSCULAR | Status: AC
  Filled 2018-12-17: qty 2

## 2018-12-17 MED ORDER — OXYCODONE HCL 5 MG PO TABS: 5.0000 mg | ORAL_TABLET | ORAL | Status: DC | PRN

## 2018-12-17 MED ORDER — HYDRALAZINE HCL 20 MG/ML IJ SOLN
INTRAMUSCULAR | Status: DC | PRN
  Administered 2018-12-17 (×2): 10 mg via INTRAVENOUS

## 2018-12-17 MED ORDER — MORPHINE SULFATE (PF) 10 MG/ML IV SOLN: 2.0000 mg | INTRAVENOUS | Status: DC | PRN

## 2018-12-17 MED ORDER — CLOPIDOGREL BISULFATE 75 MG PO TABS: 300.0000 mg | ORAL_TABLET | Freq: Once | ORAL | Status: DC

## 2018-12-17 MED ORDER — HEPARIN (PORCINE) IN NACL 1000-0.9 UT/500ML-% IV SOLN
INTRAVENOUS | Status: AC
  Filled 2018-12-17: qty 500

## 2018-12-17 MED ORDER — LABETALOL HCL 5 MG/ML IV SOLN
10.0000 mg | Freq: Once | INTRAVENOUS | Status: AC
  Administered 2018-12-17: 10 mg via INTRAVENOUS

## 2018-12-17 MED ORDER — SODIUM CHLORIDE 0.9 % IV SOLN
INTRAVENOUS | Status: DC
  Administered 2018-12-17: 08:00:00 via INTRAVENOUS

## 2018-12-17 MED ORDER — ACETAMINOPHEN 325 MG PO TABS: 650.0000 mg | ORAL_TABLET | ORAL | Status: DC | PRN

## 2018-12-17 MED ORDER — VERAPAMIL HCL 2.5 MG/ML IV SOLN
INTRAVENOUS | Status: AC
  Filled 2018-12-17: qty 2

## 2018-12-17 MED ORDER — VIPERSLIDE LUBRICANT OPTIME
TOPICAL | Status: DC | PRN
  Administered 2018-12-17: 10:00:00 via SURGICAL_CAVITY

## 2018-12-17 MED ORDER — MIDAZOLAM HCL 2 MG/2ML IJ SOLN
INTRAMUSCULAR | Status: AC
  Filled 2018-12-17: qty 2

## 2018-12-17 MED ORDER — SODIUM CHLORIDE 0.9% FLUSH: 3.0000 mL | INTRAVENOUS | Status: DC | PRN

## 2018-12-17 MED ORDER — SODIUM CHLORIDE 0.9 % IV SOLN: 250.0000 mL | INTRAVENOUS | Status: DC | PRN

## 2018-12-17 MED ORDER — LIDOCAINE HCL (PF) 1 % IJ SOLN
INTRAMUSCULAR | Status: AC
  Filled 2018-12-17: qty 30

## 2018-12-17 MED ORDER — HEPARIN SODIUM (PORCINE) 1000 UNIT/ML IJ SOLN
INTRAMUSCULAR | Status: DC | PRN
  Administered 2018-12-17: 12000 [IU] via INTRAVENOUS

## 2018-12-17 MED ORDER — CLOPIDOGREL BISULFATE 300 MG PO TABS
ORAL_TABLET | ORAL | Status: DC | PRN
  Administered 2018-12-17: 600 mg via ORAL

## 2018-12-17 MED ORDER — HYDRALAZINE HCL 20 MG/ML IJ SOLN
INTRAMUSCULAR | Status: AC
  Filled 2018-12-17: qty 1

## 2018-12-17 MED ORDER — LIDOCAINE HCL (PF) 1 % IJ SOLN
INTRAMUSCULAR | Status: DC | PRN
  Administered 2018-12-17: 30 mL

## 2018-12-17 MED ORDER — LABETALOL HCL 5 MG/ML IV SOLN
10.0000 mg | INTRAVENOUS | Status: DC | PRN
  Administered 2018-12-17: 10 mg via INTRAVENOUS
  Filled 2018-12-17: qty 4

## 2018-12-17 MED ORDER — CLOPIDOGREL BISULFATE 75 MG PO TABS: 75.0000 mg | ORAL_TABLET | Freq: Every day | ORAL | 3 refills | Status: DC

## 2018-12-17 MED ORDER — CLOPIDOGREL BISULFATE 75 MG PO TABS: 75.0000 mg | ORAL_TABLET | Freq: Every day | ORAL | Status: DC

## 2018-12-17 MED ORDER — LABETALOL HCL 5 MG/ML IV SOLN
INTRAVENOUS | Status: AC
  Administered 2018-12-17: 10 mg via INTRAVENOUS
  Filled 2018-12-17: qty 4

## 2018-12-17 MED ORDER — FENTANYL CITRATE (PF) 100 MCG/2ML IJ SOLN
INTRAMUSCULAR | Status: DC | PRN
  Administered 2018-12-17 (×2): 50 ug via INTRAVENOUS

## 2018-12-17 SURGICAL SUPPLY — 24 items
BALLN COYOTE OTW 2.5X220X150 (BALLOONS) ×3
BALLN COYOTE OTW 3.5X220X150 (BALLOONS) ×3
BALLOON COYOTE OTW 2.5X220X150 (BALLOONS) IMPLANT
BALLOON COYOTE OTW 3.5X220X150 (BALLOONS) IMPLANT
CATH CXI SUPP 2.6F 150 ST (CATHETERS) ×1 IMPLANT
CATH OMNI FLUSH 5F 65CM (CATHETERS) ×1 IMPLANT
CLOSURE MYNX CONTROL 6F/7F (Vascular Products) ×1 IMPLANT
DIAMONDBACK CLASSIC OAS 1.5MM (CATHETERS) ×3
KIT ENCORE 26 ADVANTAGE (KITS) ×1 IMPLANT
KIT MICROPUNCTURE NIT STIFF (SHEATH) ×1 IMPLANT
KIT PV (KITS) ×3 IMPLANT
LUBRICANT VIPERSLIDE CORONARY (MISCELLANEOUS) ×1 IMPLANT
SHEATH HIGHFLEX ANSEL 6FRX55 (SHEATH) ×1 IMPLANT
SHEATH PINNACLE 5F 10CM (SHEATH) ×1 IMPLANT
SHEATH PINNACLE 6F 10CM (SHEATH) ×1 IMPLANT
SYR MEDRAD MARK V 150ML (SYRINGE) ×1 IMPLANT
SYSTEM DIMNDBCK CLSC OAS 1.5MM (CATHETERS) IMPLANT
TAPE VIPERTRACK RADIOPAQ (MISCELLANEOUS) IMPLANT
TAPE VIPERTRACK RADIOPAQUE (MISCELLANEOUS) ×3
TRANSDUCER W/STOPCOCK (MISCELLANEOUS) ×3 IMPLANT
TRAY PV CATH (CUSTOM PROCEDURE TRAY) ×3 IMPLANT
WIRE BENTSON .035X145CM (WIRE) ×1 IMPLANT
WIRE G V18X300CM (WIRE) ×1 IMPLANT
WIRE VIPER WIRECTO 0.014 (WIRE) ×1 IMPLANT

## 2018-12-17 NOTE — Discharge Instructions (Signed)

## 2018-12-17 NOTE — Op Note (Signed)
    Patient name: LOVELYN SHEERAN MRN: [id number] DOB: [birth year] Sex: female  12/17/2018 Pre-operative Diagnosis: Left lower extremity claudication Post-operative diagnosis:  Same Surgeon:  Erlene Quan C. Donzetta Matters, MD Procedure Performed: 1.  Ultrasound-guided cannulation right common femoral artery 2.  Aortogram with left lower extremity runoff 3.  CSI atherectomy with 1.5 classic of left peroneal and tibioperoneal trunk arteries 4.  Balloon angioplasty of left peroneal and posterior tibial arteries with 2.5 mm balloon and tibioperoneal trunk with 3.5 mm balloon 5.  Minx device closure right common femoral artery 6.  Moderate sedation with fentanyl and Versed for 77 minutes  Indications: 68 year old female with previous history of claudication underwent left femoral to popliteal artery bypass with ipsilateral non-reversed greater saphenous vein.  She now has sluggish flows in the bypass and has return for claudication with depressed ABIs.  She is indicated for angiogram possible invention of the left lower extremity.  Findings: Aorta and iliac segments are free of flow-limiting stenosis.  There is bilateral renal artery stenosis greater than 50%.  The left common femoral to below-knee popliteal artery bypass is patent without any flow-limiting disease.  There is retrograde flow into the popliteal artery and the runoff is severely diseased with a subtotally occluded at least 90% stenosed tibioperoneal trunk for a long segment with 80% stenosis in both the posterior tibial and peroneal arteries proximally.  At completion there is no flow-limiting stenosis no flow-limiting dissection throughout the tibioperoneal trunk and posterior tibial and peroneal arteries.   Procedure:  The patient was identified in the holding area and taken to room 8.  The patient was then placed supine on the table and prepped and draped in the usual sterile fashion.  A time out was called.  Ultrasound was used to evaluate the  right common femoral artery which was noted to be patent and free of disease.  The area was anesthetized 1% lidocaine cannulated directly with ultrasound guidance with micropuncture needle followed by wire and sheath.  An image was saved the permanent record.  We placed a Bentson wire followed by 5 [place] sheath.  Omni catheter was placed to the level of L1 and aortogram was performed.  We crossed the bifurcation perform left lower extremity angiogram.  With these findings we then selected the bypass graft went up and over the bifurcation with a long 6 French sheath the patient was fully heparinized.  We used a V 18 wire and CXI catheter to cross the occlusions and tight stenoses into the peroneal artery and confirmed intraluminal access distally.  We exchanged for a Viper wire performed CSI atherectomy with 1.5 classic of the tibioperoneal trunk which was long segment as well as peroneal arteries.  This was then postdilated with 2.5 mm balloon.  There was no flow-limiting stenosis or dissection.  We then dilated the tibioperoneal trunk with 3.5 mm balloon.  We then selected the posterior tibial artery using the V 18 wire and CXI catheter.  We then ballooned the posterior tibial artery 2.5 mm balloon.  Completion demonstrated no residual dissection or stenosis.  Satisfied with this we exchanged for a short 6 [place] sheath and deployed a minx device.  She tolerated procedure without immediate complication.  Contrast: 120 cc    Dora Simeone C. Donzetta Matters, MD [facility] Office: [phone] Pager: [phone]

## 2018-12-17 NOTE — H&P (Signed)
Patient ID: Alicia [name], female   DOB: 04-May-1951, 68 y.o.   MRN: [id number]  Reason for Consult: Follow-up (6 month f/u )   Referred by Iona Beard, MD  Subjective:    Subjective    HPI:  Alicia [name] is a 68 y.o. female with history of left femoral to popliteal artery bypass with vein.  She does take aspirin daily.  She was recently on Plavix as well for a mild stroke that she had.  At that time she had CT Angio of her neck which did not demonstrate any stenosis.  She is previously from [place] having worked for the [employer] but has been in [place] for 12 years.  Only lower extremity complaints at this time is left lower extremity swelling which has been worsening.  She only has mild swelling on the right.  She does not have tissue loss or ulceration.  She does walk with the help of a cane.  She denies any claudication.      Past Medical History:  Diagnosis Date  . Arthritis   . Chronic headaches   . Constipation   . Depression   . Diabetes mellitus   . Hyperlipidemia   . Hypertension   . Obesity   . PAD (peripheral artery disease) ([place])   . Shortness of breath   . Stroke [facility])         Family History  Problem Relation Age of Onset  . Diabetes Father   . Heart disease Mother        NOT before age 39-  Bypass  . Hypertension Mother   . Hypertension Sister   . Varicose Veins Brother   . Heart disease Brother        Before age 80  . Hypertension Daughter         Past Surgical History:  Procedure Laterality Date  . FEMORAL BYPASS  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 Kathreen Cosier Social History:  Social History        Tobacco Use  . Smoking status: Light Tobacco Smoker    Packs/day: 0.50    Years: 35.00    Pack years: 17.50    Types: Cigarettes  . Smokeless tobacco: Never Used  . Tobacco comment: patient is trying to quit and has gone down to half a pack per day    Substance Use Topics  . Alcohol use: Yes    Alcohol/week: 1.0 standard drinks    Types: 1 Standard drinks or equivalent per week    Comment: occasional use only         Allergies  Allergen Reactions  . Nifedipine Er Other (See Comments)    Caused nose bleeds in higher doses (90 mg., namely)  . Latex Rash          Current Outpatient Medications  Medication Sig Dispense Refill  . acetaminophen (TYLENOL) 500 MG tablet Take 500-1,000 mg by mouth every 6 (six) hours as needed (for pain).    Marland Kitchen aspirin 81 MG EC tablet Take 1 tablet (81 mg total) by mouth daily. 30 tablet 0  . atorvastatin (LIPITOR) 80 MG tablet Take 1 tablet (80 mg total) by mouth daily at 6 PM. 90 tablet 3  . enalapril (VASOTEC) 20 MG tablet TAKE 1 TABLET BY MOUTH TWICE DAILY 60 tablet 2  . fluticasone (FLONASE) 50 MCG/ACT nasal spray Place 1 spray into both nostrils 2 (two) times daily. [place]  g 6  . metFORMIN (GLUCOPHAGE) 1000 MG tablet Take 1 tablet (1,000 mg total) by mouth 2 (two) times daily with a meal. 60 tablet 2  . metoprolol succinate (TOPROL-XL) 50 MG 24 hr tablet take 50mg  by mouth once daily  0  . NIFEdipine (ADALAT CC) 60 MG 24 hr tablet Take 1 tablet (60 mg total) by mouth daily. 30 tablet 3  . spironolactone (ALDACTONE) 25 MG tablet Take 1 tablet (25 mg total) by mouth daily. 90 tablet 1   No current facility-administered medications for this visit.     Review of Systems  Constitutional:  Constitutional negative. HENT: HENT negative.  Eyes: Eyes negative.  Respiratory: Respiratory negative.  Cardiovascular: Positive for leg swelling.  GI: Gastrointestinal negative.  Musculoskeletal: Positive for leg pain.  Neurological: Neurological negative. Hematologic: Hematologic/lymphatic negative.  Psychiatric: Psychiatric negative.        Objective:   Objective     Vitals:   12/17/18 0723  Pulse: 87  Temp: 98.2 F (36.8 C)  SpO2: 98%     Physical Exam Eyes:      Pupils: Pupils are equal, round, and reactive to light.  Neck:     Musculoskeletal: Neck supple.  Cardiovascular:     Rate and Rhythm: Normal rate.  Pulmonary:     Effort: Pulmonary effort is normal.  Abdominal:     General: Abdomen is flat.     Palpations: Abdomen is soft.  Musculoskeletal: Normal range of motion.     Right lower leg: No edema.     Left lower leg: Edema present.  Skin:    General: Skin is warm and dry.     Capillary Refill: Capillary refill takes less than 2 seconds.  Neurological:     General: No focal deficit present.     Mental Status: She is alert.  Psychiatric:        Mood and Affect: Mood normal.     Data: I independently interpreted her left lower extremity bypass graft duplex which demonstrates proximal anastomotic velocities 223 cm/s distally down to 31 cm/s.    ABIs are 0.66 right and 0.8 left     Assessment/Plan:   Assessment    68 year old female with previous left femoral-popliteal bypass for claudication.  Her only lower extremity complaint this time is swelling.  She takes aspirin and a statin.  She previously was on Plavix for mild stroke.  Did not have any carotid stenosis on recent CT Angio of her neck that was performed this year.  Does not need follow-up for this.  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  We will plan for angiogram possible intervention on the bypass graft.  I discussed with her the risk benefits alternatives and she agrees to proceed in the near future.  She did have a creatinine of 1.3 recently so we will need to possibly use CO2 versus limiting her contrast    Waynetta Sandy MD [facility]

## 2018-12-17 NOTE — Telephone Encounter (Signed)
-----   Message from Waynetta Sandy, MD sent at 12/17/2018 10:30 AM EST ----- Alicia [name] [id number] 1951/06/07  12/17/2018 Pre-operative Diagnosis: Left lower extremity claudication  Surgeon:  Erlene Quan C. Donzetta Matters, MD  Procedure Performed: 1.  Ultrasound-guided cannulation right common femoral artery 2.  Aortogram with left lower extremity runoff 3.  CSI atherectomy with 1.5 classic of left peroneal and tibioperoneal trunk arteries 4.  Balloon angioplasty of left peroneal and posterior tibial arteries with 2.5 mm balloon and tibioperoneal trunk with 3.5 mm balloon 5.  Minx device closure right common femoral artery 6.  Moderate sedation with fentanyl and Versed for 77 minutes  F/u in 4-6 weeks with me/np/pa with left leg duplex and ABI

## 2018-12-17 NOTE — Telephone Encounter (Signed)
sch appt phone NA mld ltr 02/01/2019 10am ABI 11am LLE 1130am p/o MD

## 2018-12-17 NOTE — Progress Notes (Signed)
Called Dr Donzetta Matters with updates of current bp's. Pt can be discharged at this time, pt to take oral meds at home, pt verbalized understanding.

## 2018-12-18 ENCOUNTER — Encounter (HOSPITAL_COMMUNITY): Payer: Self-pay | Admitting: Vascular Surgery

## 2018-12-18 MED FILL — Nitroglycerin IV Soln 200 MCG/ML in D5W: INTRAVENOUS | Qty: 250 | Status: AC

## 2018-12-18 MED FILL — Verapamil HCl IV Soln 2.5 MG/ML: INTRAVENOUS | Qty: 2 | Status: AC

## 2018-12-19 LAB — POCT ACTIVATED CLOTTING TIME: Activated Clotting Time: 268 seconds

## 2019-01-03 ENCOUNTER — Other Ambulatory Visit: Payer: Self-pay

## 2019-01-03 ENCOUNTER — Ambulatory Visit: Payer: Medicare HMO | Attending: Family Medicine | Admitting: Rehabilitative and Restorative Service Providers"

## 2019-01-03 VITALS — HR 88

## 2019-01-03 DIAGNOSIS — R42 Dizziness and giddiness: Secondary | ICD-10-CM | POA: Insufficient documentation

## 2019-01-03 DIAGNOSIS — R2689 Other abnormalities of gait and mobility: Secondary | ICD-10-CM | POA: Diagnosis not present

## 2019-01-03 DIAGNOSIS — R2681 Unsteadiness on feet: Secondary | ICD-10-CM | POA: Diagnosis not present

## 2019-01-03 DIAGNOSIS — M6281 Muscle weakness (generalized): Secondary | ICD-10-CM | POA: Insufficient documentation

## 2019-01-03 NOTE — Therapy (Signed)
[facility] [address] [place], [place], [address] Phone: [phone]   Fax:  [phone]  Physical Therapy Evaluation  Patient Details  Name: Alicia [name] MRN: [id number] Date of Birth: [birth year] Referring Provider (PT): Grant Fontana, MD   Encounter Date: 01/03/2019  PT End of Session - 01/03/19 2051    Visit Number  1    Number of Visits  17    Date for PT Re-Evaluation  03/04/19    Authorization Type  [name]    PT Start Time  1106    PT Stop Time  1154    PT Time Calculation (min)  48 min       Past Medical History:  Diagnosis Date  . Arthritis   . Chronic headaches   . Constipation   . Depression   . Diabetes mellitus   . Hyperlipidemia   . Hypertension   . Obesity   . PAD (peripheral artery disease) ([place])   . Shortness of breath   . Stroke [facility])     Past Surgical History:  Procedure Laterality Date  . ABDOMINAL AORTOGRAM W/LOWER EXTREMITY N/A 12/17/2018   Procedure: ABDOMINAL AORTOGRAM W/LOWER EXTREMITY;  Surgeon: Waynetta Sandy, MD;  Location: [facility];  Service: Cardiovascular;  Laterality: N/A;  . FEMORAL BYPASS  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 Bridgett Larsson  . PERIPHERAL VASCULAR ATHERECTOMY Left 12/17/2018   Procedure: PERIPHERAL VASCULAR ATHERECTOMY;  Surgeon: Waynetta Sandy, MD;  Location: [facility];  Service: Cardiovascular;  Laterality: Left;  . PERIPHERAL VASCULAR BALLOON ANGIOPLASTY Left 12/17/2018   Procedure: PERIPHERAL VASCULAR BALLOON ANGIOPLASTY;  Surgeon: Waynetta Sandy, MD;  Location: [facility];  Service: Cardiovascular;  Laterality: Left;    Vitals:   01/03/19 1145  Pulse: 88  SpO2: 98%     Subjective Assessment - 01/03/19 1113    Subjective  The patient reports vertigo has been "light" going on for about a year.  She notes it got worse in December.  She notes the  dizziness is a little better since undergoing a recent vascular procedure on her left leg. She c/o dizziness when standing up and trying to walk.  She also notes symptoms when rolling in bed.  Even sitting still, she feels a moderate amount of dizziness, but knows she is okay because she is seated.  "I'm scared I'm going to fall."  She notes she cannot take her trash out and neighbors are having to help.      Pertinent History  CVA april 2019, 68 HTN, diabetes, PAD with L fem pop bypass, smoker, CHF.    Patient Stated Goals  "Go back to regular stuff."   Return to [facility] but scared to walk down steps to get into the pool.   [name]).      Currently in Pain?  Yes   she notes chronic pain issues with back and legs.   Effect of Pain on Daily Activities  *PT to monitor pain, but no pain goals to follow due to nature of referral.         [facility] PT Assessment - 01/03/19 1120      Assessment   Medical Diagnosis  Vertigo    Referring Provider (PT)  Grant Fontana, MD    Onset Date/Surgical Date  11/12/18    Prior Therapy  none      Precautions   Precautions  Fall  Restrictions   Weight Bearing Restrictions  No      Balance Screen   Has the patient fallen in the past 6 months  Yes    How many times?  1- when in the tub    Has the patient had a decrease in activity level because of a fear of falling?   Yes   due to dizziness   Is the patient reluctant to leave their home because of a fear of falling?   Yes   "I'm scared to do anything."     [place] residence    Living Arrangements  Alone    Type of [place] to enter    Entrance Stairs-Number of Steps  2    Entrance Stairs-Rails  Can reach both    [place]  One level    [place] - single point;Walker - 2 wheels;Walker - 4 wheels      Prior Function   Level of Independence  Independent with household mobility without device;Independent with  community mobility without device   began using cane in past 3 months   Vocation  Retired      Charity fundraiser Status  Within Functional Limits for tasks assessed      Observation/Other Assessments   Focus on Therapeutic Outcomes (FOTO)   16%    Dizziness Handicap Inventory (DHI)   72% indicating severe perception of disability      Sensation   Light Touch  Appears Intact    Additional Comments  Notes occasional heaviness or numbness in feet.      ROM / Strength   AROM / PROM / Strength  AROM;Strength      AROM   Overall AROM   Deficits    Overall AROM Comments  Decreased bilateral shoulder ROM for flexion noting h/o Rshoulder pathology and stiffness in L shoulder since pneumonia shot      Strength   Overall Strength Comments  4/5 bilateral shoulder flexion/abduction with pain; 5/5 elbow flexion bilaterally.  Hip flexion is 3+/5 bilaterally, R knee flexion/extension is 4/5, L knee not tested due to tenderness from recent vascular procedure, and ankle DF if 5/5 bilaterally.      Transfers   Transfers  Sit to Stand;Stand to Sit    Sit to Stand  6: Modified independent (Device/Increase time);With upper extremity assist    Stand to Sit  6: Modified independent (Device/Increase time);With upper extremity assist      Ambulation/Gait   Ambulation/Gait  Yes    Ambulation/Gait Assistance  5: Supervision    Ambulation/Gait Assistance Details  Patient uses cane in left hand    Ambulation Distance (Feet)  125 Feet    Assistive device  Straight cane    Gait Pattern  Step-through pattern   slowed pace   Ambulation Surface  Level;Indoor    Gait velocity  1.74 ft/sec with SPC and supervision.    Stairs  Yes    Stairs Assistance  4: Min assist    Stair Management Technique  Two rails;Step to pattern   leads with left leg up   Number of Stairs  4    Gait Comments  Short of breath with ambulation.            Vestibular Assessment - 01/03/19 1125      Vestibular  Assessment   General Observation  Patient walks into clinic with  SPC noting baseline dizziness of "Moderate".      Symptom Behavior   Type of Dizziness  Imbalance    Frequency of Dizziness  daily    Duration of Dizziness  when on feet    Aggravating Factors  Activity in general;Turning head quickly    Relieving Factors  Head stationary      Occulomotor Exam   Occulomotor Alignment  Abnormal   Mild L eye hypertropia   Spontaneous  Absent    Gaze-induced  Absent    Smooth Pursuits  Intact    Saccades  Intact      Vestibulo-Occular Reflex   VOR 1 Head Only (x 1 viewing)  slow VOR x 5 reps provokes a sensation of subjective dizziness     Comment  head impulse test=positive to the right side for refixation saccade.      Positional Testing   Dix-Hallpike  Dix-Hallpike Right;Dix-Hallpike Left    Sidelying Test  Sidelying Right;Sidelying Left    Horizontal Canal Testing  Horizontal Canal Right;Horizontal Canal Left      Dix-Hallpike Right   Dix-Hallpike Right Duration  subjective sensation of mild dizziness; no spinning    Dix-Hallpike Right Symptoms  No nystagmus      Dix-Hallpike Left   Dix-Hallpike Left Duration  R is worse, but trace amount of dizziness    Dix-Hallpike Left Symptoms  No nystagmus      Sidelying Right   Sidelying Right Duration  mild subjective report    Sidelying Right Symptoms  No nystagmus      Sidelying Left   Sidelying Left Duration  none    Sidelying Left Symptoms  No nystagmus      Horizontal Canal Right   Horizontal Canal Right Duration  "this side feels crazy"; settles after 2-3 seconds/ no nystagmus viewed in room light    Horizontal Canal Right Symptoms  Normal      Horizontal Canal Left   Horizontal Canal Left Duration  none    Horizontal Canal Left Symptoms  Normal          Objective measurements completed on examination: See above findings.              PT Education - 01/03/19 2050    Education Details  slowly increasing  daily walking (commercial break exercise) performing 2x/hour for short walks during day    Person(s) Educated  Patient    Methods  Explanation    Comprehension  Verbalized understanding       PT Short Term Goals - 01/03/19 2059      PT SHORT TERM GOAL #1   Title  The patient will be indep with HEP for LE strengthening, standing balance, habituation for motion sensitivity, gaze and general mobility.    Time  4    Period  Weeks    Target Date  02/02/19      PT SHORT TERM GOAL #2   Title  The patient will perform sit>stand without UE support 5/5 trials to demo improving LE strength for functional tasks.    Time  4    Period  Weeks    Target Date  02/02/19      PT SHORT TERM GOAL #3   Title  The patient will improve gait speed from 1.74 ft/sec to > or equal to 2.4 ft/sec to demo dec'ing risk for fall and improved mobility.    Time  4    Period  Weeks    Target Date  02/02/19  PT SHORT TERM GOAL #4   Title  The patient will negotiate 4 steps with one handrail and a reciprocal pattern with supervision.    Time  4    Period  Weeks    Target Date  02/02/19      PT SHORT TERM GOAL #5   Title  Complete Berg balance test to establish a baseline.    Time  4    Period  Weeks    Target Date  02/02/19        PT Long Term Goals - 01/03/19 2120      PT LONG TERM GOAL #1   Title  The patient will be indep with progression of HEP.    Time  8    Period  Weeks    Target Date  03/04/19      PT LONG TERM GOAL #2   Title  The patient will improve berg balance score by 8 points from baseline (established for STGs).    Time  8    Period  Weeks    Target Date  03/04/19      PT LONG TERM GOAL #3   Title  The patient will reduce DHI from 72% to < or equal to 55% to demo dec'd self perception of dizziness    Time  8    Period  Weeks    Target Date  03/04/19      PT LONG TERM GOAL #4   Title  The patient will improve gait speed from 1.74 ft/sec to > equal to 2.6 ft/sec to demo  improving community gait.    Time  8    Period  Weeks    Target Date  03/04/19      PT LONG TERM GOAL #5   Title  The patient will ambulate short, community distances x 500 ft with SPC mod indep.    Time  8    Period  Weeks    Target Date  03/04/19             Plan - 01/03/19 2123    Clinical Impression Statement  The patient is a 68 year old female referred to OP physical therapy with h/o vertigo, CVA and recent vascular surgery in L LE.  She presents with baseline dizziness rated as "moderate", increased dizziness when on her feet, + head impulse test indicating vestibular hypofunction, LE weakness, slowed gait speed, and unsteadiness.  She did not have evidencce of positional vertigo today with her symptoms present sitting still and when standing.  PT to progress patient to tolerance focusing on improving functional  mobility and reducing risk for falls.    History and Personal Factors relevant to plan of care:  CVA april 2019, 68 HTN, diabetes, PAD with L fem pop bypass, smoker, CHF.A april 2019, HTN, diabetes, PAD with L fem pop bypass, smoker, CHF.    Clinical Presentation  Evolving    Clinical Presentation due to:  Fear of falling, limited mobility, recent surgery    Clinical Decision Making  Moderate    Rehab Potential  Good    PT Frequency  2x / week   +eval   PT Duration  8 weeks    PT Treatment/Interventions  ADLs/Self Care Home Management;Therapeutic activities;Therapeutic exercise;Balance training;Neuromuscular re-education;Gait training;Stair training;Functional mobility training;Patient/family education;Manual techniques;Vestibular;Canalith Repostioning    PT Next Visit Plan  Check for [facility] authorization*, Begin HEP including:  Sit<>stand, gaze x 1 sitting, corner balance exercises.  Complete Berg balance test.  Gait training, stair training and general safety/conditioning.  Provide written walking program (getting up 1-2x/each hour for 3 minutes to walk in home).     Consulted and Agree with Plan of Care  Patient       Patient will benefit from skilled therapeutic intervention in order to improve the following deficits and impairments:  Abnormal gait, Decreased activity tolerance, Decreased balance, Decreased mobility, Decreased strength, Dizziness, Decreased endurance, Pain  Visit Diagnosis: Other abnormalities of gait and mobility  Unsteadiness on feet  Muscle weakness (generalized)  Dizziness and giddiness     Problem List Patient Active Problem List   Diagnosis Date Noted  . Prolonged Q-T interval on ECG 04/27/2018  . CHF (congestive heart failure) ([place]) 04/27/2018  . Weakness 04/03/2018  . Syncope 04/03/2018  . Renal insufficiency 03/16/2018  . Diabetes mellitus type 2 in obese ([place]) 03/16/2018  . Morbid obesity ([place])   . CVA (cerebral vascular accident) ([place]) 03/15/2018  . Numbness in both hands- and Feet 06/06/2014  . Weakness of both legs 06/06/2014  . Swelling of limb-Left Leg > Right 06/06/2014  . Peripheral vascular disease, unspecified ([place]) 11/30/2012  . Pain in limb 11/30/2012  . Atherosclerosis of native arteries of the extremities with intermittent claudication 04/06/2012  . CHEST PAIN 01/03/2011  . Hyperlipidemia 07/20/2007  . Essential hypertension, benign 07/20/2007  . ARTHRITIS 07/20/2007    Jermani Eberlein, PT 01/03/2019, 9:40 PM  [facility] [address] [place], [place], [address] Phone: [phone]   Fax:  [phone]  Name: LAKENYA RIENDEAU MRN: [id number] Date of Birth: November 06, 1951

## 2019-01-15 ENCOUNTER — Ambulatory Visit: Payer: Medicare HMO | Admitting: Physical Therapy

## 2019-01-15 ENCOUNTER — Encounter: Payer: Self-pay | Admitting: Physical Therapy

## 2019-01-15 DIAGNOSIS — R42 Dizziness and giddiness: Secondary | ICD-10-CM

## 2019-01-15 DIAGNOSIS — M6281 Muscle weakness (generalized): Secondary | ICD-10-CM | POA: Diagnosis not present

## 2019-01-15 DIAGNOSIS — R2689 Other abnormalities of gait and mobility: Secondary | ICD-10-CM | POA: Diagnosis not present

## 2019-01-15 DIAGNOSIS — R2681 Unsteadiness on feet: Secondary | ICD-10-CM | POA: Diagnosis not present

## 2019-01-15 NOTE — Patient Instructions (Addendum)
  Access Code: [email or web address]  URL: [email or web address]  Date: 01/15/2019  Prepared by: Barry Brunner   Exercises  Standing with Head Rotation - 10 reps - 1 sets - - hold - 1x daily - 7x weekly  Standing Balance in Corner with Eyes Closed - 2 reps - 1 sets - 30 seconds hold - 1x daily - 5x weekly    Gaze Stabilization: Tip Card  1.Target must remain in focus, not blurry, and appear stationary while head is in motion. 2.Perform exercises with small head movements (30 to either side of midline). 3.Increase speed of head motion so long as target is in focus. 4.If you wear eyeglasses, be sure you can see target through lens (therapist will give specific instructions for bifocal / progressive lenses). 5.These exercises should provoke dizziness or nausea. Work through these symptoms. If too dizzy, slow head movement slightly. (Too dizzy is moderately dizzy or 5 out of 10 with a 10 meaning you're so dizzy you are falling).  Overall the symptoms of dizziness or nausea should resolve within 30 minutes of completing all repetitions. 6.Exercises demand concentration; avoid distractions. Begin with the target on a simple background unless instructed otherwise.      Special Instructions:  If symptoms are lasting longer than 30 minutes, modify your exercises by:    >decreasing the # of times you complete each activity >ensuring your symptoms return to baseline before moving onto the next exercise >dividing up exercises so you do not do them all in one session, but multiple short sessions throughout the day >doing them once a day until symptoms improve   Gaze Stabilization: Sitting    Keeping eyes on target on wall 3 feet away, and move head side to side for _30-60___ seconds. Rest and repeat side to side. Then repeat while moving head up and down for __30-60__ seconds. Rest and repeat up and down. (ie do each direction two times)  Do __2-3__ sessions per day.

## 2019-01-15 NOTE — Therapy (Signed)
[facility] [address] [place], [place], [address] Phone: [phone]   Fax:  [phone]  Physical Therapy Treatment  Patient Details  Name: Alicia [name] MRN: [id number] Date of Birth: 1951-03-08 Referring Provider (PT): Grant Fontana, MD   Encounter Date: 01/15/2019  PT End of Session - 01/15/19 1314    Visit Number  2    Number of Visits  17    Date for PT Re-Evaluation  03/04/19    Authorization Type  [facility] #[id number]; $40 copay    PT Start Time  1315    PT Stop Time  1400    PT Time Calculation (min)  45 min    Activity Tolerance  Patient tolerated treatment well    Behavior During Therapy  [facility] for tasks assessed/performed       Past Medical History:  Diagnosis Date  . Arthritis   . Chronic headaches   . Constipation   . Depression   . Diabetes mellitus   . Hyperlipidemia   . Hypertension   . Obesity   . PAD (peripheral artery disease) ([place])   . Shortness of breath   . Stroke [facility])     Past Surgical History:  Procedure Laterality Date  . ABDOMINAL AORTOGRAM W/LOWER EXTREMITY N/A 12/17/2018   Procedure: ABDOMINAL AORTOGRAM W/LOWER EXTREMITY;  Surgeon: Waynetta Sandy, MD;  Location: [place] [facility];  Service: Cardiovascular;  Laterality: N/A;  . FEMORAL BYPASS  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 Bridgett Larsson  . PERIPHERAL VASCULAR ATHERECTOMY Left 12/17/2018   Procedure: PERIPHERAL VASCULAR ATHERECTOMY;  Surgeon: Waynetta Sandy, MD;  Location: [place] [facility];  Service: Cardiovascular;  Laterality: Left;  . PERIPHERAL VASCULAR BALLOON ANGIOPLASTY Left 12/17/2018   Procedure: PERIPHERAL VASCULAR BALLOON ANGIOPLASTY;  Surgeon: Waynetta Sandy, MD;  Location: [place] [facility];  Service: Cardiovascular;  Laterality: Left;    There were no vitals filed for this visit.  Subjective Assessment - 01/15/19 1315     Subjective  Dizziness is about an 8/10 most of the time (10 is falling or throwing up). Denies falls.     Pertinent History  CVA april 2019, HTN, diabetes, PAD with L fem pop bypass, smoker, CHF.    Patient Stated Goals  "Go back to regular stuff."   Return to [facility] but scared to walk down steps to get into the pool.   [name]).      Currently in Pain?  Yes    Pain Score  3     Pain Location  Leg    Pain Orientation  Left    Pain Descriptors / Indicators  Shooting    Pain Type  Chronic pain    Pain Onset  More than a month ago    Pain Frequency  Constant    Aggravating Factors   unsure    Pain Relieving Factors  unsure    Effect of Pain on Daily Activities  "comes and goes"                       [facility] Adult PT Treatment/Exercise - 01/15/19 0001      Ambulation/Gait   Gait Comments  Educated in need to increase her general activity for habituation; Educated to walk for 1-2 minutes every hour when she is awake. Gradually increase walking time by 1 minute every few days up to 10 minutes 3x per day  Vestibular Treatment/Exercise - 01/15/19 0001      Vestibular Treatment/Exercise   Vestibular Treatment Provided  Gaze    Gaze Exercises  X1 Viewing Horizontal;X1 Viewing Vertical      X1 Viewing Horizontal   Foot Position  sitting    Reps  4    Comments  incr time/cues for proper technique and conditions that indicate to stop exercise      X1 Viewing Vertical   Foot Position  sitting    Reps  3    Comments  incr time/cues for proper technique and conditions that indicate to stop exercise         Balance Exercises - 01/15/19 1940      Balance Exercises: Standing   Standing Eyes Opened  Wide (BOA);Head turns;Solid surface    Standing Eyes Closed  Wide (BOA);Solid surface   single finger on chair back       PT Education - 01/15/19 1945    Education Details  see HEP; need to increase her general activity level for habituation    Person(s)  Educated  Patient    Methods  Explanation;Demonstration;Verbal cues;Handout    Comprehension  Verbalized understanding;Returned demonstration;Verbal cues required;Need further instruction       PT Short Term Goals - 01/03/19 2059      PT SHORT TERM GOAL #1   Title  The patient will be indep with HEP for LE strengthening, standing balance, habituation for motion sensitivity, gaze and general mobility.    Time  4    Period  Weeks    Target Date  02/02/19      PT SHORT TERM GOAL #2   Title  The patient will perform sit>stand without UE support 5/5 trials to demo improving LE strength for functional tasks.    Time  4    Period  Weeks    Target Date  02/02/19      PT SHORT TERM GOAL #3   Title  The patient will improve gait speed from 1.74 ft/sec to > or equal to 2.4 ft/sec to demo dec'ing risk for fall and improved mobility.    Time  4    Period  Weeks    Target Date  02/02/19      PT SHORT TERM GOAL #4   Title  The patient will negotiate 4 steps with one handrail and a reciprocal pattern with supervision.    Time  4    Period  Weeks    Target Date  02/02/19      PT SHORT TERM GOAL #5   Title  Complete Berg balance test to establish a baseline.    Time  4    Period  Weeks    Target Date  02/02/19        PT Long Term Goals - 01/03/19 2120      PT LONG TERM GOAL #1   Title  The patient will be indep with progression of HEP.    Time  8    Period  Weeks    Target Date  03/04/19      PT LONG TERM GOAL #2   Title  The patient will improve berg balance score by 8 points from baseline (established for STGs).    Time  8    Period  Weeks    Target Date  03/04/19      PT LONG TERM GOAL #3   Title  The patient will reduce DHI from 72% to < or equal  to 55% to demo dec'd self perception of dizziness    Time  8    Period  Weeks    Target Date  03/04/19      PT LONG TERM GOAL #4   Title  The patient will improve gait speed from 1.74 ft/sec to > equal to 2.6 ft/sec to demo  improving community gait.    Time  8    Period  Weeks    Target Date  03/04/19      PT LONG TERM GOAL #5   Title  The patient will ambulate short, community distances x 500 ft with SPC mod indep.    Time  8    Period  Weeks    Target Date  03/04/19            Plan - 01/15/19 1947    Clinical Impression Statement  Session focused on intiating HEP. Patient educated on rationale for VORx1 exercise and corner balance exercises. Educated on 3 systems contributing to balance for improved buy-in to HEP with pt stating interest and appreciation. Patient clearly verbalized that she knows she has to do her HEP if she wants to get better.     Rehab Potential  Good    PT Frequency  2x / week   +eval   PT Duration  8 weeks    PT Treatment/Interventions  ADLs/Self Care Home Management;Therapeutic activities;Therapeutic exercise;Balance training;Neuromuscular re-education;Gait training;Stair training;Functional mobility training;Patient/family education;Manual techniques;Vestibular;Canalith Repostioning    PT Next Visit Plan  chk HEP given 2/25 including getting up 1x/each hour for 1-2 minutes to walk in home; add to HEP:  Sit<>stand, gaze x 1 sitting, ?update corner balance exercises; ?progress VORx1.  Complete Berg balance test.  Gait training, stair training and general safety/conditioning.      PT Home Exercise Plan  [email or web address]    Consulted and Agree with Plan of Care  Patient       Patient will benefit from skilled therapeutic intervention in order to improve the following deficits and impairments:  Abnormal gait, Decreased activity tolerance, Decreased balance, Decreased mobility, Decreased strength, Dizziness, Decreased endurance, Pain  Visit Diagnosis: Unsteadiness on feet  Dizziness and giddiness     Problem List Patient Active Problem List   Diagnosis Date Noted  . Prolonged Q-T interval on ECG 04/27/2018  . CHF (congestive heart failure) ([place]) 04/27/2018  . Weakness 04/03/2018   . Syncope 04/03/2018  . Renal insufficiency 03/16/2018  . Diabetes mellitus type 2 in obese ([place]) 03/16/2018  . Morbid obesity ([place])   . CVA (cerebral vascular accident) ([place]) 03/15/2018  . Numbness in both hands- and Feet 06/06/2014  . Weakness of both legs 06/06/2014  . Swelling of limb-Left Leg > Right 06/06/2014  . Peripheral vascular disease, unspecified ([place]) 11/30/2012  . Pain in limb 11/30/2012  . Atherosclerosis of native arteries of the extremities with intermittent claudication 04/06/2012  . CHEST PAIN 01/03/2011  . Hyperlipidemia 07/20/2007  . Essential hypertension, benign 07/20/2007  . ARTHRITIS 07/20/2007    Rexanne Mano, PT 01/15/2019, 7:55 PM  [facility] [address] [place], [place], [address] Phone: [phone]   Fax:  [phone]  Name: BHAVIKA SCHNIDER MRN: [id number] Date of Birth: [birth year]

## 2019-01-23 ENCOUNTER — Ambulatory Visit: Payer: Medicare HMO | Attending: Family Medicine | Admitting: Rehabilitative and Restorative Service Providers"

## 2019-01-23 ENCOUNTER — Encounter: Payer: Self-pay | Admitting: Rehabilitative and Restorative Service Providers"

## 2019-01-23 VITALS — HR 120

## 2019-01-23 DIAGNOSIS — M6281 Muscle weakness (generalized): Secondary | ICD-10-CM | POA: Insufficient documentation

## 2019-01-23 DIAGNOSIS — R2681 Unsteadiness on feet: Secondary | ICD-10-CM | POA: Diagnosis not present

## 2019-01-23 DIAGNOSIS — R2689 Other abnormalities of gait and mobility: Secondary | ICD-10-CM | POA: Insufficient documentation

## 2019-01-23 DIAGNOSIS — R42 Dizziness and giddiness: Secondary | ICD-10-CM | POA: Insufficient documentation

## 2019-01-23 NOTE — Therapy (Signed)
[facility] [address] [place], [place], [address] Phone: [phone]   Fax:  [phone]  Physical Therapy Treatment  Patient Details  Name: Alicia [name] MRN: [id number] Date of Birth: 01-09-1951 Referring Provider (PT): Grant Fontana, MD   Encounter Date: 01/23/2019  PT End of Session - 01/23/19 0857    Visit Number  3    Number of Visits  17    Date for PT Re-Evaluation  03/04/19    Authorization Type  [facility] #[id number]; $40 copay    PT Start Time  0852    PT Stop Time  0932    PT Time Calculation (min)  40 min    Activity Tolerance  Patient tolerated treatment well    Behavior During Therapy  [facility] for tasks assessed/performed       Past Medical History:  Diagnosis Date  . Arthritis   . Chronic headaches   . Constipation   . Depression   . Diabetes mellitus   . Hyperlipidemia   . Hypertension   . Obesity   . PAD (peripheral artery disease) ([place])   . Shortness of breath   . Stroke [facility])     Past Surgical History:  Procedure Laterality Date  . ABDOMINAL AORTOGRAM W/LOWER EXTREMITY N/A 12/17/2018   Procedure: ABDOMINAL AORTOGRAM W/LOWER EXTREMITY;  Surgeon: Waynetta Sandy, MD;  Location: [facility];  Service: Cardiovascular;  Laterality: N/A;  . FEMORAL BYPASS  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 Bridgett Larsson  . PERIPHERAL VASCULAR ATHERECTOMY Left 12/17/2018   Procedure: PERIPHERAL VASCULAR ATHERECTOMY;  Surgeon: Waynetta Sandy, MD;  Location: [facility];  Service: Cardiovascular;  Laterality: Left;  . PERIPHERAL VASCULAR BALLOON ANGIOPLASTY Left 12/17/2018   Procedure: PERIPHERAL VASCULAR BALLOON ANGIOPLASTY;  Surgeon: Waynetta Sandy, MD;  Location: [facility] [facility];  Service: Cardiovascular;  Laterality: Left;    Vitals:   01/23/19 0853  Pulse: (!) 120  SpO2: 95%    Subjective Assessment -  01/23/19 0853    Subjective  Dizziness is present every day.  She notes it is severe in nature stating "I'm afraid to get up."  She and PT discuss it is "imbalance and weakness", not a true spinning or dizziness.     Pertinent History  CVA april 2019, HTN, diabetes, PAD with L fem pop bypass, smoker, CHF.    Patient Stated Goals  "Go back to regular stuff."   Return to [facility] but scared to walk down steps to get into the pool.   [name]).      Currently in Pain?  Yes    Pain Score  --   "not too much; I took pain medicine"   Pain Location  Leg    Pain Orientation  Left    Pain Descriptors / Indicators  Aching    Pain Onset  More than a month ago    Pain Frequency  Constant    Aggravating Factors   unsure    Pain Relieving Factors  pain medicine         [facility] PT Assessment - 01/23/19 0858      Standardized Balance Assessment   Standardized Balance Assessment  Berg Balance Test      Berg Balance Test   Sit to Stand  Able to stand  independently using hands    Standing Unsupported  Able to stand 2 minutes with supervision   feels  lightheaded and is swaying in her head   Sitting with Back Unsupported but Feet Supported on Floor or Stool  Able to sit safely and securely 2 minutes    Stand to Sit  Controls descent by using hands    Transfers  Able to transfer safely, definite need of hands    Standing Unsupported with Eyes Closed  Able to stand 10 seconds with supervision    Standing Unsupported with Feet Together  Able to place feet together independently and stand for 1 minute with supervision    From Standing, Reach Forward with Outstretched Arm  Can reach forward >5 cm safely (2")    From Standing Position, Pick up Object from Floor  Unable to pick up and needs supervision    From Standing Position, Turn to Look Behind Over each Shoulder  Turn sideways only but maintains balance    Turn 360 Degrees  Needs assistance while turning    Standing Unsupported, Alternately Place  Feet on Step/Stool  Able to complete >2 steps/needs minimal assist    Standing Unsupported, One Foot in Front  Able to take small step independently and hold 30 seconds    Standing on One Leg  Unable to try or needs assist to prevent fall    Total Score  30    Berg comment:  30/56 indicating high fall risk                   [facility] Adult PT Treatment/Exercise - 01/23/19 0001      Ambulation/Gait   Ambulation/Gait  Yes    Ambulation/Gait Assistance  5: Supervision;4: Min guard    Ambulation/Gait Assistance Details  Patient walks into clinic with SPC x 100 ft and walks 230 ft without device with CGA.  PT provides cues for upright posture, visual spotting, and breathing with activities.    Ambulation Distance (Feet)  230 Feet   115 ft x 2 without device with CGA   Assistive device  None;Straight cane    Gait Pattern  Step-through pattern    Ambulation Surface  Level;Indoor      Self-Care   Self-Care  Other Self-Care Comments    Other Self-Care Comments   Patient and PT discussed continuing frequent walking throughout the day.        Neuro Re-ed    Neuro Re-ed Details   Sidestepping x 8 steps x 3 reps to each side in parallel bars with CGA no UE support.        Exercises   Exercises  Other Exercises    Other Exercises   sit<>stand x 5 reps      Vestibular Treatment/Exercise - 01/23/19 0913      Vestibular Treatment/Exercise   Vestibular Treatment Provided  Habituation;Gaze    Habituation Exercises  180 degree Turns      180 degree Turns   Symptom Description   Began working on turns starting with 90 degree turns using visual fixation with "eyes and head spot the ball and now turn your body to face it" and reverse.  Used for saccades/ visual compensation, and for habituation of dizziness and imbalance.      X1 Viewing Horizontal   Foot Position  standing    Reps  2    Comments  20 reps with CGA      X1 Viewing Vertical   Foot Position  standing    Reps  2     Comments  20 reps with CGA  PT Short Term Goals - 01/23/19 1610      PT SHORT TERM GOAL #1   Title  The patient will be indep with HEP for LE strengthening, standing balance, habituation for motion sensitivity, gaze and general mobility.    Time  4    Period  Weeks    Target Date  02/02/19      PT SHORT TERM GOAL #2   Title  The patient will perform sit>stand without UE support 5/5 trials to demo improving LE strength for functional tasks.    Time  4    Period  Weeks    Target Date  02/02/19      PT SHORT TERM GOAL #3   Title  The patient will improve gait speed from 1.74 ft/sec to > or equal to 2.4 ft/sec to demo dec'ing risk for fall and improved mobility.    Time  4    Period  Weeks    Target Date  02/02/19      PT SHORT TERM GOAL #4   Title  The patient will negotiate 4 steps with one handrail and a reciprocal pattern with supervision.    Time  4    Period  Weeks    Target Date  02/02/19      PT SHORT TERM GOAL #5   Title  Complete Berg balance test to establish a baseline.    Baseline  Baseline is 30/56 on 01/23/2019    Time  4    Period  Weeks    Status  Achieved    Target Date  02/02/19        PT Long Term Goals - 01/23/19 0929      PT LONG TERM GOAL #1   Title  The patient will be indep with progression of HEP.    Time  8    Period  Weeks      PT LONG TERM GOAL #2   Title  The patient will improve berg balance score by 8 points from baseline (established for STGs).    Baseline  30/56 at baseline.    Time  8    Period  Weeks      PT LONG TERM GOAL #3   Title  The patient will reduce DHI from 72% to < or equal to 55% to demo dec'd self perception of dizziness    Time  8    Period  Weeks      PT LONG TERM GOAL #4   Title  The patient will improve gait speed from 1.74 ft/sec to > equal to 2.6 ft/sec to demo improving community gait.    Time  8    Period  Weeks      PT LONG TERM GOAL #5   Title  The patient will ambulate short,  community distances x 500 ft with SPC mod indep.    Time  8    Period  Weeks            Plan - 01/23/19 9604    Clinical Impression Statement  The patient is limited in endurance with shortness of breath today.  PT continuing to work on habituation with head motion and during dynamic balance tasks.  PT to begin checking STGs next week.    PT Treatment/Interventions  ADLs/Self Care Home Management;Therapeutic activities;Therapeutic exercise;Balance training;Neuromuscular re-education;Gait training;Stair training;Functional mobility training;Patient/family education;Manual techniques;Vestibular;Canalith Repostioning    PT Next Visit Plan  Endurance/ gait activities, gaze exercises> progress for home, habituation, balance  and general mobility.    PT Home Exercise Plan  [email or web address]    Consulted and Agree with Plan of Care  Patient       Patient will benefit from skilled therapeutic intervention in order to improve the following deficits and impairments:  Abnormal gait, Decreased activity tolerance, Decreased balance, Decreased mobility, Decreased strength, Dizziness, Decreased endurance, Pain  Visit Diagnosis: Unsteadiness on feet  Dizziness and giddiness  Other abnormalities of gait and mobility  Muscle weakness (generalized)     Problem List Patient Active Problem List   Diagnosis Date Noted  . Prolonged Q-T interval on ECG 04/27/2018  . CHF (congestive heart failure) ([place]) 04/27/2018  . Weakness 04/03/2018  . Syncope 04/03/2018  . Renal insufficiency 03/16/2018  . Diabetes mellitus type 2 in obese ([place]) 03/16/2018  . Morbid obesity ([place])   . CVA (cerebral vascular accident) ([place]) 03/15/2018  . Numbness in both hands- and Feet 06/06/2014  . Weakness of both legs 06/06/2014  . Swelling of limb-Left Leg > Right 06/06/2014  . Peripheral vascular disease, unspecified ([place]) 11/30/2012  . Pain in limb 11/30/2012  . Atherosclerosis of native arteries of the extremities with  intermittent claudication 04/06/2012  . CHEST PAIN 01/03/2011  . Hyperlipidemia 07/20/2007  . Essential hypertension, benign 07/20/2007  . ARTHRITIS 07/20/2007    Bryten Maher, PT 01/23/2019, 10:28 PM  [facility] [address] [place], [place], [address] Phone: [phone]   Fax:  [phone]  Name: Alicia [name] MRN: [id number] Date of Birth: [birth year]

## 2019-01-24 ENCOUNTER — Ambulatory Visit: Payer: Medicare HMO | Admitting: Adult Health

## 2019-01-25 ENCOUNTER — Ambulatory Visit: Payer: Medicare HMO | Admitting: Rehabilitative and Restorative Service Providers"

## 2019-01-28 ENCOUNTER — Ambulatory Visit: Payer: Medicare HMO | Admitting: Rehabilitative and Restorative Service Providers"

## 2019-01-28 ENCOUNTER — Other Ambulatory Visit: Payer: Self-pay

## 2019-01-28 ENCOUNTER — Encounter: Payer: Self-pay | Admitting: Rehabilitative and Restorative Service Providers"

## 2019-01-28 DIAGNOSIS — R2681 Unsteadiness on feet: Secondary | ICD-10-CM | POA: Diagnosis not present

## 2019-01-28 DIAGNOSIS — M6281 Muscle weakness (generalized): Secondary | ICD-10-CM | POA: Diagnosis not present

## 2019-01-28 DIAGNOSIS — R2689 Other abnormalities of gait and mobility: Secondary | ICD-10-CM | POA: Diagnosis not present

## 2019-01-28 DIAGNOSIS — I779 Disorder of arteries and arterioles, unspecified: Secondary | ICD-10-CM

## 2019-01-28 DIAGNOSIS — R42 Dizziness and giddiness: Secondary | ICD-10-CM | POA: Diagnosis not present

## 2019-01-28 DIAGNOSIS — I739 Peripheral vascular disease, unspecified: Secondary | ICD-10-CM

## 2019-01-28 DIAGNOSIS — F17200 Nicotine dependence, unspecified, uncomplicated: Secondary | ICD-10-CM

## 2019-01-28 NOTE — Patient Instructions (Signed)
MOVE MORE:  In order to get stronger, you need to be out of your bedroom more often throughout the day.  At first, after breakfast walk for about 3 minutes before sitting down.  Each hour that you are awake, try to walk for 3-5 minutes.    IT WILL BE HARD AT FIRST, BUT YOU WILL FEEL SO MUCH BETTER!

## 2019-01-29 NOTE — Therapy (Signed)
[facility] [address] [place], [place], [address] Phone: [phone]   Fax:  [phone]  Physical Therapy Treatment  Patient Details  Name: Alicia [name] MRN: [id number] Date of Birth: [birth year] Referring Provider (PT): Grant Fontana, MD   Encounter Date: 01/28/2019  PT End of Session - 01/28/19 2119    Visit Number  4    Number of Visits  17    Date for PT Re-Evaluation  03/04/19    Authorization Type  [place] #[id number]; $40 copay    PT Start Time  0935    PT Stop Time  1016    PT Time Calculation (min)  41 min    Activity Tolerance  Patient tolerated treatment well    Behavior During Therapy  [facility] for tasks assessed/performed       Past Medical History:  Diagnosis Date  . Arthritis   . Chronic headaches   . Constipation   . Depression   . Diabetes mellitus   . Hyperlipidemia   . Hypertension   . Obesity   . PAD (peripheral artery disease) ([place])   . Shortness of breath   . Stroke [facility])     Past Surgical History:  Procedure Laterality Date  . ABDOMINAL AORTOGRAM W/LOWER EXTREMITY N/A 12/17/2018   Procedure: ABDOMINAL AORTOGRAM W/LOWER EXTREMITY;  Surgeon: Waynetta Sandy, MD;  Location: [place] [facility];  Service: Cardiovascular;  Laterality: N/A;  . FEMORAL BYPASS  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 Bridgett Larsson  . PERIPHERAL VASCULAR ATHERECTOMY Left 12/17/2018   Procedure: PERIPHERAL VASCULAR ATHERECTOMY;  Surgeon: Waynetta Sandy, MD;  Location: [place] [facility];  Service: Cardiovascular;  Laterality: Left;  . PERIPHERAL VASCULAR BALLOON ANGIOPLASTY Left 12/17/2018   Procedure: PERIPHERAL VASCULAR BALLOON ANGIOPLASTY;  Surgeon: Waynetta Sandy, MD;  Location: [place] [facility];  Service: Cardiovascular;  Laterality: Left;    There were no vitals filed for this visit.  Subjective Assessment - 01/28/19 0940     Subjective  Patient notes she is in bed most of the day noting she sits on the bed a lot.  She continues with dizziness anytime she is on her feet.      Pertinent History  CVA april 2019, HTN, diabetes, PAD with L fem pop bypass, smoker, CHF.    Patient Stated Goals  "Go back to regular stuff."   Return to [facility] but scared to walk down steps to get into the pool.   [name]).      Currently in Pain?  Yes    Pain Score  --   moderate level   Pain Location  Knee   and Back   Pain Orientation  Left    Pain Descriptors / Indicators  Aching    Pain Type  Chronic pain    Pain Onset  More than a month ago    Pain Frequency  Constant    Aggravating Factors   unsure    Pain Relieving Factors  pain medicine                       [facility] Adult PT Treatment/Exercise - 01/28/19 0945      Ambulation/Gait   Ambulation/Gait  Yes    Ambulation/Gait Assistance  5: Supervision    Ambulation/Gait Assistance Details  Patient walks into clinic wiht SPC (it is too low again- it is not set to correct height).  Ambulation Distance (Feet)  200 Feet    Assistive device  None;Straight cane    Gait Pattern  Step-through pattern    Ambulation Surface  Level;Indoor    Gait Comments  115 x 3 reps for general endurance.      Exercises   Exercises  Other Exercises    Other Exercises   Standing reaching R and L sides overhead working on posture, endurance and general mobility.  Began first step of squats hinging at the hips holding x 5 seconds and repeating 7 times before needing to rest.  Sit<>stand from standard height chair x 5 reps with 1 UE.  Active rest break exercises of seated ankle AROM/circles, ankle DF/PF and seated marching x 10 reps.  UBE for endurance/conditioning 2 minutes forward, 1 minute backeward and then 30 seconds of fast pedaling on UBE.               PT Education - 01/29/19 1500    Education Details  Recommended more frequent movement, getting out of bed  for longer periods during the day.    Person(s) Educated  Patient    Methods  Explanation    Comprehension  Verbalized understanding       PT Short Term Goals - 01/28/19 1014      PT SHORT TERM GOAL #1   Title  The patient will be indep with HEP for LE strengthening, standing balance, habituation for motion sensitivity, gaze and general mobility.    Time  4    Period  Weeks    Target Date  02/02/19      PT SHORT TERM GOAL #2   Title  The patient will perform sit>stand without UE support 5/5 trials to demo improving LE strength for functional tasks.    Time  4    Period  Weeks    Target Date  02/02/19      PT SHORT TERM GOAL #3   Title  The patient will improve gait speed from 1.74 ft/sec to > or equal to 2.4 ft/sec to demo dec'ing risk for fall and improved mobility.    Baseline  2.21 ft/sec on 01/28/2019    Time  4    Period  Weeks    Target Date  02/02/19      PT SHORT TERM GOAL #4   Title  The patient will negotiate 4 steps with one handrail and a reciprocal pattern with supervision.    Time  4    Period  Weeks    Target Date  02/02/19      PT SHORT TERM GOAL #5   Title  Complete Berg balance test to establish a baseline.    Baseline  Baseline is 30/56 on 01/23/2019    Time  4    Period  Weeks    Status  Achieved    Target Date  02/02/19        PT Long Term Goals - 01/23/19 0929      PT LONG TERM GOAL #1   Title  The patient will be indep with progression of HEP.    Time  8    Period  Weeks      PT LONG TERM GOAL #2   Title  The patient will improve berg balance score by 8 points from baseline (established for STGs).    Baseline  30/56 at baseline.    Time  8    Period  Weeks      PT LONG TERM  GOAL #3   Title  The patient will reduce DHI from 72% to < or equal to 55% to demo dec'd self perception of dizziness    Time  8    Period  Weeks      PT LONG TERM GOAL #4   Title  The patient will improve gait speed from 1.74 ft/sec to > equal to 2.6 ft/sec to  demo improving community gait.    Time  8    Period  Weeks      PT LONG TERM GOAL #5   Title  The patient will ambulate short, community distances x 500 ft with SPC mod indep.    Time  8    Period  Weeks            Plan - 01/29/19 1502    Clinical Impression Statement  Today's session focused on increasing endurance and strength and providing recommendations for more frequent movement and discussing small changes to lifestyle that will have a large impact on her health.  PT to check STGs next visit.    PT Treatment/Interventions  ADLs/Self Care Home Management;Therapeutic activities;Therapeutic exercise;Balance training;Neuromuscular re-education;Gait training;Stair training;Functional mobility training;Patient/family education;Manual techniques;Vestibular;Canalith Repostioning    PT Next Visit Plan  Did patient work on walking?, did she start sitting in living room chairs instead of bed?  Endurance/ gait activities, gaze exercises> progress for home, habituation, balance and general mobility.    Consulted and Agree with Plan of Care  Patient       Patient will benefit from skilled therapeutic intervention in order to improve the following deficits and impairments:  Abnormal gait, Decreased activity tolerance, Decreased balance, Decreased mobility, Decreased strength, Dizziness, Decreased endurance, Pain  Visit Diagnosis: Unsteadiness on feet  Dizziness and giddiness  Other abnormalities of gait and mobility  Muscle weakness (generalized)     Problem List Patient Active Problem List   Diagnosis Date Noted  . Prolonged Q-T interval on ECG 04/27/2018  . CHF (congestive heart failure) ([place]) 04/27/2018  . Weakness 04/03/2018  . Syncope 04/03/2018  . Renal insufficiency 03/16/2018  . Diabetes mellitus type 2 in obese ([place]) 03/16/2018  . Morbid obesity ([place])   . CVA (cerebral vascular accident) ([place]) 03/15/2018  . Numbness in both hands- and Feet 06/06/2014  . Weakness of  both legs 06/06/2014  . Swelling of limb-Left Leg > Right 06/06/2014  . Peripheral vascular disease, unspecified ([place]) 11/30/2012  . Pain in limb 11/30/2012  . Atherosclerosis of native arteries of the extremities with intermittent claudication 04/06/2012  . CHEST PAIN 01/03/2011  . Hyperlipidemia 07/20/2007  . Essential hypertension, benign 07/20/2007  . ARTHRITIS 07/20/2007    Emmitte Surgeon, PT 01/29/2019, 3:04 PM  [facility] [address] [place], [place], [address] Phone: [phone]   Fax:  [phone]  Name: ROZELL KETTLEWELL MRN: [id number] Date of Birth: 1951-03-29

## 2019-01-31 ENCOUNTER — Encounter: Payer: Self-pay | Admitting: Rehabilitative and Restorative Service Providers"

## 2019-01-31 ENCOUNTER — Ambulatory Visit: Payer: Medicare HMO | Admitting: Rehabilitative and Restorative Service Providers"

## 2019-01-31 DIAGNOSIS — R2681 Unsteadiness on feet: Secondary | ICD-10-CM | POA: Diagnosis not present

## 2019-01-31 DIAGNOSIS — R42 Dizziness and giddiness: Secondary | ICD-10-CM | POA: Diagnosis not present

## 2019-01-31 DIAGNOSIS — M6281 Muscle weakness (generalized): Secondary | ICD-10-CM

## 2019-01-31 DIAGNOSIS — R2689 Other abnormalities of gait and mobility: Secondary | ICD-10-CM | POA: Diagnosis not present

## 2019-01-31 NOTE — Therapy (Signed)
[facility] [address] [place], [place], [address] Phone: [phone]   Fax:  [phone]  Physical Therapy Treatment  Patient Details  Name: Alicia [name] MRN: [id number] Date of Birth: 28-Mar-1951 Referring Provider (PT): Grant Fontana, MD   Encounter Date: 01/31/2019  PT End of Session - 01/31/19 1815    Visit Number  5    Number of Visits  17    Date for PT Re-Evaluation  03/04/19    Authorization Type  [place] #[id number]; $40 copay    PT Start Time  1110    PT Stop Time  1150    PT Time Calculation (min)  40 min    Activity Tolerance  Patient tolerated treatment well    Behavior During Therapy  [facility] for tasks assessed/performed       Past Medical History:  Diagnosis Date  . Arthritis   . Chronic headaches   . Constipation   . Depression   . Diabetes mellitus   . Hyperlipidemia   . Hypertension   . Obesity   . PAD (peripheral artery disease) ([place])   . Shortness of breath   . Stroke [facility])     Past Surgical History:  Procedure Laterality Date  . ABDOMINAL AORTOGRAM W/LOWER EXTREMITY N/A 12/17/2018   Procedure: ABDOMINAL AORTOGRAM W/LOWER EXTREMITY;  Surgeon: Waynetta Sandy, MD;  Location: [place] [facility];  Service: Cardiovascular;  Laterality: N/A;  . FEMORAL BYPASS  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 Bridgett Larsson  . PERIPHERAL VASCULAR ATHERECTOMY Left 12/17/2018   Procedure: PERIPHERAL VASCULAR ATHERECTOMY;  Surgeon: Waynetta Sandy, MD;  Location: [place] [facility];  Service: Cardiovascular;  Laterality: Left;  . PERIPHERAL VASCULAR BALLOON ANGIOPLASTY Left 12/17/2018   Procedure: PERIPHERAL VASCULAR BALLOON ANGIOPLASTY;  Surgeon: Waynetta Sandy, MD;  Location: [place] [facility];  Service: Cardiovascular;  Laterality: Left;    There were no vitals filed for this visit.  Subjective Assessment - 01/31/19 1111     Subjective  The patient reports that her knees are sore today.  She does note increased dizziness when she takes medications.      Pertinent History  CVA april 2019, HTN, diabetes, PAD with L fem pop bypass, smoker, CHF.    Patient Stated Goals  "Go back to regular stuff."   Return to [facility] but scared to walk down steps to get into the pool.   [name]).      Currently in Pain?  Yes    Pain Score  5     Pain Location  Knee    Pain Orientation  Left;Right    Pain Descriptors / Indicators  Aching;Sore    Pain Type  Chronic pain    Pain Onset  More than a month ago    Pain Frequency  Constant    Aggravating Factors   unsure    Pain Relieving Factors  pain medicine.                       [place] Adult PT Treatment/Exercise - 01/31/19 1122      Ambulation/Gait   Ambulation/Gait  Yes    Ambulation/Gait Assistance  5: Supervision    Ambulation/Gait Assistance Details  Patient walks into clinic mod indep with SPc.  In clinic, she does not use cane and PT provides supervision.      Ambulation Distance (Feet)  400 Feet  Assistive device  None;Straight cane    Ambulation Surface  Level;Indoor    Stairs  Yes    Stairs Assistance  5: Supervision    Stairs Assistance Details (indicate cue type and reason)  Leans on elbows on railings to ascend steps.    Stair Management Technique  Two rails;Step to pattern    Number of Stairs  4    Gait Comments  Gait incrasing speed with verbal cues x 200 ft, ball toss x 50 ft x 3 reps while walking.  Rested with spO2=90% and HR=116bpm.        Neuro Re-ed    Neuro Re-ed Details   1/4 turns with eyes/head + body movement for visual fixation with SBA to CGA.        Exercises   Exercises  Other Exercises    Other Exercises   Sit<>stand x 5 reps without hands from mat height surface.  Sci-fit x 8 minutes 1 minute slow pace (50 rpm) 1 minute at faster pace (70 rpm).        Vestibular Treatment/Exercise - 01/31/19 1127       Vestibular Treatment/Exercise   Vestibular Treatment Provided  Gaze    Gaze Exercises  X1 Viewing Horizontal;X1 Viewing Vertical      X1 Viewing Horizontal   Foot Position  standing without UE support    Comments  30 seconds with CGA      X1 Viewing Vertical   Foot Position  standing without UE support    Comments  30 seconds without UE support with CGA.              PT Short Term Goals - 01/31/19 1136      PT SHORT TERM GOAL #1   Title  The patient will be indep with HEP for LE strengthening, standing balance, habituation for motion sensitivity, gaze and general mobility.    Time  4    Period  Weeks    Target Date  02/02/19      PT SHORT TERM GOAL #2   Title  The patient will perform sit>stand without UE support 5/5 trials to demo improving LE strength for functional tasks.    Time  4    Period  Weeks    Status  Achieved    Target Date  02/02/19      PT SHORT TERM GOAL #3   Title  The patient will improve gait speed from 1.74 ft/sec to > or equal to 2.4 ft/sec to demo dec'ing risk for fall and improved mobility.    Baseline  2.31 ft/sec on 01/31/2019    Time  4    Period  Weeks    Status  Partially Met    Target Date  02/02/19      PT SHORT TERM GOAL #4   Title  The patient will negotiate 4 steps with one handrail and a reciprocal pattern with supervision.    Time  4    Period  Weeks    Target Date  02/02/19      PT SHORT TERM GOAL #5   Title  Complete Berg balance test to establish a baseline.    Baseline  Baseline is 30/56 on 01/23/2019    Time  4    Period  Weeks    Status  Achieved    Target Date  02/02/19        PT Long Term Goals - 01/23/19 0929      PT LONG TERM  GOAL #1   Title  The patient will be indep with progression of HEP.    Time  8    Period  Weeks      PT LONG TERM GOAL #2   Title  The patient will improve berg balance score by 8 points from baseline (established for STGs).    Baseline  30/56 at baseline.    Time  8    Period   Weeks      PT LONG TERM GOAL #3   Title  The patient will reduce DHI from 72% to < or equal to 55% to demo dec'd self perception of dizziness    Time  8    Period  Weeks      PT LONG TERM GOAL #4   Title  The patient will improve gait speed from 1.74 ft/sec to > equal to 2.6 ft/sec to demo improving community gait.    Time  8    Period  Weeks      PT LONG TERM GOAL #5   Title  The patient will ambulate short, community distances x 500 ft with SPC mod indep.    Time  8    Period  Weeks            Plan - 01/31/19 2012    Clinical Impression Statement  The patient continues to be limited on steps needing to use a step to pattern and increased weight through UEs.  Endurance continues to limit community mobility.  Patient continues to describe "dizzy" sensation when she is on her feet.  PT recommending continued progression of VOR exercises and increased activity t/o the day.     PT Treatment/Interventions  ADLs/Self Care Home Management;Therapeutic activities;Therapeutic exercise;Balance training;Neuromuscular re-education;Gait training;Stair training;Functional mobility training;Patient/family education;Manual techniques;Vestibular;Canalith Repostioning    PT Next Visit Plan  Inquire about > walking and getting out of bed.  Gait activities, gaze exercises, habituation, balance, endurance, general mobility.      PT Home Exercise Plan  [id number]    Consulted and Agree with Plan of Care  Patient       Patient will benefit from skilled therapeutic intervention in order to improve the following deficits and impairments:  Abnormal gait, Decreased activity tolerance, Decreased balance, Decreased mobility, Decreased strength, Dizziness, Decreased endurance, Pain  Visit Diagnosis: Unsteadiness on feet  Dizziness and giddiness  Other abnormalities of gait and mobility  Muscle weakness (generalized)     Problem List Patient Active Problem List   Diagnosis Date Noted  . Prolonged  Q-T interval on ECG 04/27/2018  . CHF (congestive heart failure) ([place]) 04/27/2018  . Weakness 04/03/2018  . Syncope 04/03/2018  . Renal insufficiency 03/16/2018  . Diabetes mellitus type 2 in obese ([place]) 03/16/2018  . Morbid obesity ([place])   . CVA (cerebral vascular accident) ([place]) 03/15/2018  . Numbness in both hands- and Feet 06/06/2014  . Weakness of both legs 06/06/2014  . Swelling of limb-Left Leg > Right 06/06/2014  . Peripheral vascular disease, unspecified ([place]) 11/30/2012  . Pain in limb 11/30/2012  . Atherosclerosis of native arteries of the extremities with intermittent claudication 04/06/2012  . CHEST PAIN 01/03/2011  . Hyperlipidemia 07/20/2007  . Essential hypertension, benign 07/20/2007  . ARTHRITIS 07/20/2007    Tresa Jolley, PT 01/31/2019, 8:14 PM  [facility] [address] [place], [place], [address] Phone: [phone]   Fax:  [phone]  Name: DESTANE SPEAS MRN: [id number] Date of Birth: [birth year]

## 2019-02-01 ENCOUNTER — Other Ambulatory Visit: Payer: Self-pay

## 2019-02-01 ENCOUNTER — Ambulatory Visit (HOSPITAL_COMMUNITY)
Admission: RE | Admit: 2019-02-01 | Discharge: 2019-02-01 | Disposition: A | Discharge status: Home / Self Care | Payer: Medicare HMO | Source: Ambulatory Visit | Attending: Vascular Surgery | Admitting: Vascular Surgery

## 2019-02-01 ENCOUNTER — Encounter: Payer: Self-pay | Admitting: Vascular Surgery

## 2019-02-01 ENCOUNTER — Ambulatory Visit (INDEPENDENT_AMBULATORY_CARE_PROVIDER_SITE_OTHER)
Admit: 2019-02-01 | Discharge: 2019-02-01 | Disposition: A | Discharge status: Home / Self Care | Payer: Medicare HMO | Attending: Vascular Surgery | Admitting: Vascular Surgery

## 2019-02-01 ENCOUNTER — Ambulatory Visit (INDEPENDENT_AMBULATORY_CARE_PROVIDER_SITE_OTHER): Payer: Medicare HMO | Admitting: Vascular Surgery

## 2019-02-01 VITALS — BP 203/109 | HR 81 | Temp 97.0°F | Resp 16 | Ht 67.0 in | Wt 256.0 lb

## 2019-02-01 DIAGNOSIS — F17200 Nicotine dependence, unspecified, uncomplicated: Secondary | ICD-10-CM

## 2019-02-01 DIAGNOSIS — I779 Disorder of arteries and arterioles, unspecified: Secondary | ICD-10-CM | POA: Diagnosis not present

## 2019-02-01 DIAGNOSIS — I739 Peripheral vascular disease, unspecified: Secondary | ICD-10-CM | POA: Insufficient documentation

## 2019-02-01 NOTE — Progress Notes (Signed)
Patient ID: Alicia [name], female   DOB: [birth year], 68 y.o.   MRN: [id number]  Reason for Consult: Follow-up (4-6 wk ABI, LLE, post op Aortogram.  S/p LLE Claudication. )   Referred by Rutherford Guys, MD  Subjective:     HPI:  Alicia [name] is a 68 y.o. female with a history of left femoral-popliteal artery bypass with vein 2012.  She does take Plavix after recent atherectomy and angioplasty of her left TP trunk.  She does have left lower extremity swelling but this is improving.  No tissue loss or ulceration.  No claudication at this time.  Right leg is also fine.  Past Medical History:  Diagnosis Date  . Arthritis   . Chronic headaches   . Constipation   . Depression   . Diabetes mellitus   . Hyperlipidemia   . Hypertension   . Obesity   . PAD (peripheral artery disease) ([place])   . Shortness of breath   . Stroke [facility])    Family History  Problem Relation Age of Onset  . Diabetes Father   . Heart disease Mother        NOT before age 79-  Bypass  . Hypertension Mother   . Hypertension Sister   . Varicose Veins Brother   . Heart disease Brother        Before age 58  . Hypertension Daughter    Past Surgical History:  Procedure Laterality Date  . ABDOMINAL AORTOGRAM W/LOWER EXTREMITY N/A 12/17/2018   Procedure: ABDOMINAL AORTOGRAM W/LOWER EXTREMITY;  Surgeon: Waynetta Sandy, MD;  Location: [place] [facility];  Service: Cardiovascular;  Laterality: N/A;  . FEMORAL BYPASS  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 Bridgett Larsson  . PERIPHERAL VASCULAR ATHERECTOMY Left 12/17/2018   Procedure: PERIPHERAL VASCULAR ATHERECTOMY;  Surgeon: Waynetta Sandy, MD;  Location: [place] [facility];  Service: Cardiovascular;  Laterality: Left;  . PERIPHERAL VASCULAR BALLOON ANGIOPLASTY Left 12/17/2018   Procedure: PERIPHERAL VASCULAR BALLOON ANGIOPLASTY;  Surgeon: Waynetta Sandy, MD;  Location: [place] [facility];  Service:  Cardiovascular;  Laterality: Left;    Short Social History:  Social History   Tobacco Use  . Smoking status: Light Tobacco Smoker    Packs/day: 0.50    Years: 35.00    Pack years: 17.50    Types: Cigarettes  . Smokeless tobacco: Never Used  . Tobacco comment: patient is trying to quit and has gone down to half a pack per day  Substance Use Topics  . Alcohol use: Yes    Alcohol/week: 1.0 standard drinks    Types: 1 Standard drinks or equivalent per week    Comment: occasional use only    Allergies  Allergen Reactions  . Nifedipine Er Other (See Comments)    Caused nose bleeds in higher doses (90 mg., namely)  . Latex Rash    Current Outpatient Medications  Medication Sig Dispense Refill  . acetaminophen (TYLENOL) 500 MG tablet Take 1,000 mg by mouth every 6 (six) hours as needed for moderate pain or headache.     Mar[place] Kitchen aspirin 81 MG EC tablet Take 1 tablet (81 mg total) by mouth daily. 30 tablet 0  . atorvastatin (LIPITOR) 80 MG tablet Take 1 tablet (80 mg total) by mouth daily at 6 PM. 90 tablet 3  . clopidogrel (PLAVIX) 75 MG tablet Take 1 tablet (75 mg total) by mouth daily. 30 tablet 3  . enalapril (  VASOTEC) 20 MG tablet TAKE 1 TABLET BY MOUTH TWICE DAILY (Patient taking differently: Take 20 mg by mouth 2 (two) times daily. ) 180 tablet 0  . fluticasone (FLONASE) 50 MCG/ACT nasal spray Place 1 spray into both nostrils 2 (two) times daily. (Patient taking differently: Place 1 spray into both nostrils 2 (two) times daily as needed for allergies or rhinitis. ) 16 g 6  . metFORMIN (GLUCOPHAGE) 1000 MG tablet Take 1 tablet (1,000 mg total) by mouth 2 (two) times daily with a meal. 60 tablet 2  . metoprolol succinate (TOPROL-XL) 50 MG 24 hr tablet Take 50 mg by mouth daily.   0  . NIFEdipine (ADALAT CC) 60 MG 24 hr tablet Take 1 tablet (60 mg total) by mouth daily. 30 tablet 3  . spironolactone (ALDACTONE) 25 MG tablet Take 1 tablet (25 mg total) by mouth daily. 90 tablet 1   No  current facility-administered medications for this visit.     Review of Systems  Constitutional:  Constitutional negative. HENT: HENT negative.  Eyes: Eyes negative.  Respiratory: Respiratory negative.  Cardiovascular: Cardiovascular negative.  GI: Gastrointestinal negative.  Musculoskeletal: Positive for joint pain.  Skin: Skin negative.  Neurological: Neurological negative. Hematologic: Hematologic/lymphatic negative.  Psychiatric: Psychiatric negative.        Objective:  Objective   Vitals:   02/01/19 1125 02/01/19 1129  BP: (!) 202/98 (!) 203/109  Pulse: 81   Resp: 16   Temp: (!) 97 F (36.1 C)   TempSrc: Oral   SpO2: 97%   Weight: 256 lb (116.1 kg)   Height: 5\' 7"  (1.702 m)    Body mass index is 40.1 kg/m.  Physical Exam Constitutional:      Appearance: Normal appearance.  Eyes:     Pupils: Pupils are equal, round, and reactive to light.  Neck:     Musculoskeletal: Normal range of motion and neck supple.  Cardiovascular:     Rate and Rhythm: Normal rate.     Comments: Strong signal of left peronal/pt Pulmonary:     Effort: Pulmonary effort is normal.  Abdominal:     General: Abdomen is flat.     Palpations: Abdomen is soft.  Musculoskeletal:        General: No swelling.     Left lower leg: Edema present.  Skin:    General: Skin is warm and dry.     Capillary Refill: Capillary refill takes less than 2 seconds.  Neurological:     General: No focal deficit present.     Mental Status: She is alert and oriented to person, place, and time.  Psychiatric:        Mood and Affect: Mood normal.        Thought Content: Thought content normal.        Judgment: Judgment normal.     Data: I have independently turbid her ABIs to be 0.63 right 1.01 left with toe pressure right 114 and left 189  Also independently interpreted her bypass duplex on the left which demonstrates patency without evidence of restenosis.       Assessment/Plan:    68 year old  female follows up after treatment of her peroneal artery as well as posterior tibial artery and tibioperoneal trunk with atherectomy and balloon angioplasty.  This was to keep the bypass open as there was no outflow on angiographic evaluation.  There is some low flow in the graft bypass today at 30 but she has great signals in the posterior tibial and  peroneal arteries and previously the bypass looked pristine and patent.  We will have her follow-up in 6 months with repeat left lower extremity duplex and ABIs.  I discussed with her that I would expect the swelling to improve over time and that if she has issues before 6 months please call us.      Waynetta Sandy MD [facility]

## 2019-02-06 ENCOUNTER — Encounter: Payer: Self-pay | Admitting: Rehabilitative and Restorative Service Providers"

## 2019-02-06 ENCOUNTER — Ambulatory Visit: Payer: Medicare HMO | Admitting: Rehabilitative and Restorative Service Providers"

## 2019-02-06 DIAGNOSIS — M6281 Muscle weakness (generalized): Secondary | ICD-10-CM | POA: Diagnosis not present

## 2019-02-06 DIAGNOSIS — R2681 Unsteadiness on feet: Secondary | ICD-10-CM | POA: Diagnosis not present

## 2019-02-06 DIAGNOSIS — R42 Dizziness and giddiness: Secondary | ICD-10-CM

## 2019-02-06 DIAGNOSIS — R2689 Other abnormalities of gait and mobility: Secondary | ICD-10-CM | POA: Diagnosis not present

## 2019-02-06 NOTE — Patient Instructions (Signed)
Access Code: [email or web address] URL: [email or web address]  Date: 02/06/2019  Prepared by: Rudell Cobb   Program Notes  In order to get stronger, you need to be out of your bedroom more often throughout the day.  At first, after breakfast walk for about 3 minutes before sitting down.  Each hour that you are awake, try to walk for 3-5 minutes.   IT WILL BE HARD AT FIRST, BUT YOU WILL FEEL SO MUCH BETTER!  Exercises Standing with Head Rotation - 10 reps - 1 sets - - hold - 1x daily - 7x weekly Standing Balance in Corner with Eyes Closed - 2 reps - 1 sets - 30 seconds hold - 1x daily - 7x weekly Standing Gaze Stabilization with Head Rotation - 1 reps - 2 sets - 3x daily - 7x weekly Standing Gaze Stabilization with Head Nod - 1 reps - 2 sets - 3x daily - 7x weekly Sit to Stand with Hands on Knees - 10 reps - 2 sets - 1x daily - 7x weekly Side Stepping with Counter Support - 10 reps - 3 sets - 1x daily - 7x weekly Mini Squat with Counter Support - 10 reps - 3 sets - 1x daily - 7x weekly

## 2019-02-07 ENCOUNTER — Encounter: Payer: Self-pay | Admitting: Family Medicine

## 2019-02-07 ENCOUNTER — Ambulatory Visit (INDEPENDENT_AMBULATORY_CARE_PROVIDER_SITE_OTHER): Payer: Medicare HMO | Admitting: Family Medicine

## 2019-02-07 ENCOUNTER — Other Ambulatory Visit: Payer: Self-pay

## 2019-02-07 VITALS — BP 138/84 | HR 100 | Temp 98.3°F | Ht 67.0 in | Wt 258.0 lb

## 2019-02-07 DIAGNOSIS — E1169 Type 2 diabetes mellitus with other specified complication: Secondary | ICD-10-CM

## 2019-02-07 DIAGNOSIS — Z780 Asymptomatic menopausal state: Secondary | ICD-10-CM

## 2019-02-07 DIAGNOSIS — E669 Obesity, unspecified: Secondary | ICD-10-CM | POA: Diagnosis not present

## 2019-02-07 DIAGNOSIS — I739 Peripheral vascular disease, unspecified: Secondary | ICD-10-CM

## 2019-02-07 DIAGNOSIS — I10 Essential (primary) hypertension: Secondary | ICD-10-CM

## 2019-02-07 LAB — POCT GLYCOSYLATED HEMOGLOBIN (HGB A1C): Hemoglobin A1C: 7.5 % — AB (ref 4.0–5.6)

## 2019-02-07 MED ORDER — SITAGLIPTIN PHOSPHATE 50 MG PO TABS: 50.0000 mg | ORAL_TABLET | Freq: Every day | ORAL | 1 refills | Status: DC

## 2019-02-07 MED ORDER — METFORMIN HCL 1000 MG PO TABS: 1000.0000 mg | ORAL_TABLET | Freq: Two times a day (BID) | ORAL | 1 refills | Status: DC

## 2019-02-07 MED ORDER — ATORVASTATIN CALCIUM 80 MG PO TABS: 80.0000 mg | ORAL_TABLET | Freq: Every day | ORAL | 3 refills | Status: DC

## 2019-02-07 MED ORDER — ENALAPRIL MALEATE 20 MG PO TABS: 20.0000 mg | ORAL_TABLET | Freq: Two times a day (BID) | ORAL | 1 refills | Status: DC

## 2019-02-07 MED ORDER — METOPROLOL SUCCINATE ER 50 MG PO TB24: 50.0000 mg | ORAL_TABLET | Freq: Every day | ORAL | 1 refills | Status: DC

## 2019-02-07 MED ORDER — NIFEDIPINE ER 60 MG PO TB24: 60.0000 mg | ORAL_TABLET | Freq: Every day | ORAL | 1 refills | Status: DC

## 2019-02-07 MED ORDER — METFORMIN HCL 1000 MG PO TABS: 1000.0000 mg | ORAL_TABLET | Freq: Two times a day (BID) | ORAL | 2 refills | Status: DC

## 2019-02-07 MED ORDER — SPIRONOLACTONE 25 MG PO TABS: 25.0000 mg | ORAL_TABLET | Freq: Every day | ORAL | 1 refills | Status: DC

## 2019-02-07 NOTE — Patient Instructions (Addendum)
  Hard candy, sugar free pudding or jello, sugar free popsicles    If you have lab work done today you will be contacted with your lab results within the next 2 weeks.  If you have not heard from [place] then please contact us. The fastest way to get your results is to register for My Chart.   IF you received an x-ray today, you will receive an invoice from [facility]. Please contact [facility] at [phone] with questions or concerns regarding your invoice.   IF you received labwork today, you will receive an invoice from [place]. Please contact [facility] at [phone] with questions or concerns regarding your invoice.   Our billing staff will not be able to assist you with questions regarding bills from these companies.  You will be contacted with the lab results as soon as they are available. The fastest way to get your results is to activate your My Chart account. Instructions are located on the last page of this paperwork. If you have not heard from [place] regarding the results in 2 weeks, please contact this office.

## 2019-02-07 NOTE — Therapy (Signed)
[facility] [address] [place], [place], [address] Phone: [phone]   Fax:  [phone]  Physical Therapy Treatment  Patient Details  Name: Alicia [name] MRN: [id number] Date of Birth: [birth year] Referring Provider (PT): Grant Fontana, MD   Encounter Date: 02/06/2019  PT End of Session - 02/06/19 1231    Visit Number  6    Number of Visits  17    Date for PT Re-Evaluation  03/04/19    Authorization Type  [employer] #[id number]; $40 copay    PT Start Time  1231    PT Stop Time  1315    PT Time Calculation (min)  44 min    Activity Tolerance  Patient tolerated treatment well    Behavior During Therapy  [facility] for tasks assessed/performed       Past Medical History:  Diagnosis Date  . Arthritis   . Chronic headaches   . Constipation   . Depression   . Diabetes mellitus   . Hyperlipidemia   . Hypertension   . Obesity   . PAD (peripheral artery disease) ([place])   . Shortness of breath   . Stroke [facility])     Past Surgical History:  Procedure Laterality Date  . ABDOMINAL AORTOGRAM W/LOWER EXTREMITY N/A 12/17/2018   Procedure: ABDOMINAL AORTOGRAM W/LOWER EXTREMITY;  Surgeon: Waynetta Sandy, MD;  Location: [place] [facility];  Service: Cardiovascular;  Laterality: N/A;  . FEMORAL BYPASS  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 Bridgett Larsson  . PERIPHERAL VASCULAR ATHERECTOMY Left 12/17/2018   Procedure: PERIPHERAL VASCULAR ATHERECTOMY;  Surgeon: Waynetta Sandy, MD;  Location: [place] CV [facility];  Service: Cardiovascular;  Laterality: Left;  . PERIPHERAL VASCULAR BALLOON ANGIOPLASTY Left 12/17/2018   Procedure: PERIPHERAL VASCULAR BALLOON ANGIOPLASTY;  Surgeon: Waynetta Sandy, MD;  Location: [place] [facility];  Service: Cardiovascular;  Laterality: Left;    There were no vitals filed for this visit.  Subjective Assessment - 02/06/19 1232     Subjective  The patient notes her knees are sore, but not as bad as they were the other day.      The patient is getting out of bed more.  The patient feels that some days are better from a dizzy standpoint.  The patient inquires about holding remaining PT visits until mid April due to [place].    Pertinent History  CVA april 2019, HTN, diabetes, PAD with L fem pop bypass, smoker, CHF.    Patient Stated Goals  "Go back to regular stuff."   Return to [facility] but scared to walk down steps to get into the pool.   [name]).      Currently in Pain?  Yes    Pain Score  6     Pain Location  Knee    Pain Orientation  Right;Left    Pain Descriptors / Indicators  Aching;Sore    Pain Type  Chronic pain    Pain Onset  More than a month ago    Pain Frequency  Constant    Aggravating Factors   unsure    Pain Relieving Factors  pain medicine                       [facility] Adult PT Treatment/Exercise - 02/06/19 1200      Ambulation/Gait   Ambulation/Gait  Yes    Ambulation/Gait Assistance  6: Modified independent (Device/Increase time)  Ambulation/Gait Assistance Details  The patient is now able to walk in the clinic mod indep without SPC.  Fatigue and shortness of breath limit greater ambulation distances.    Ambulation Distance (Feet)  345 Feet    Assistive device  None;Straight cane    Gait Pattern  Step-through pattern    Ambulation Surface  Level;Indoor    Stairs  Yes    Stairs Assistance  5: Supervision    Stairs Assistance Details (indicate cue type and reason)  patient encouraged to avoid leaning and try to ascend with recirpocal pattern.  She is able to do at a slow pace.    Stair Management Technique  Two rails;Step to pattern;Alternating pattern    Number of Stairs  8      Self-Care   Self-Care  Other Self-Care Comments    Other Self-Care Comments   Discussed home activity.  Patient is planning to self quarantine x 2 weeks due to coronavirus.  PT cautioned  against sedentary lifestyle at home.  We discussed home walking for 3-5 minutes every hour between breakfast and dinner.  Recommended HEP performed for strength and balance 1x/day and for gaze adaptation 3 x per day.       Exercises   Exercises  Other Exercises    Other Exercises   *See patient instructions.  PT had patient ambulate for 3 minutes, then perform the HEP with rest breaks every 2-3 exercises.               PT Education - 02/07/19 0901    Education Details  updated HEP to add more LE strengthening; continuing to encourage greater moiblity    Person(s) Educated  Patient    Methods  Explanation;Demonstration;Handout    Comprehension  Verbalized understanding;Returned demonstration       PT Short Term Goals - 02/06/19 1303      PT SHORT TERM GOAL #1   Title  The patient will be indep with HEP for LE strengthening, standing balance, habituation for motion sensitivity, gaze and general mobility.    Time  4    Period  Weeks    Status  Achieved    Target Date  02/02/19      PT SHORT TERM GOAL #2   Title  The patient will perform sit>stand without UE support 5/5 trials to demo improving LE strength for functional tasks.    Time  4    Period  Weeks    Status  Achieved    Target Date  02/02/19      PT SHORT TERM GOAL #3   Title  The patient will improve gait speed from 1.74 ft/sec to > or equal to 2.4 ft/sec to demo dec'ing risk for fall and improved mobility.    Baseline  2.31 ft/sec on 01/31/2019    Time  4    Period  Weeks    Status  Partially Met    Target Date  02/02/19      PT SHORT TERM GOAL #4   Title  The patient will negotiate 4 steps with one handrail and a reciprocal pattern with supervision.    Time  4    Period  Weeks    Status  Not Met    Target Date  02/02/19      PT SHORT TERM GOAL #5   Title  Complete Berg balance test to establish a baseline.    Baseline  Baseline is 30/56 on 01/23/2019    Time  4  Period  Weeks    Status  Achieved     Target Date  02/02/19        PT Long Term Goals - 02/06/19 1305      PT LONG TERM GOAL #1   Title  The patient will be indep with progression of HEP.    Time  8    Period  Weeks      PT LONG TERM GOAL #2   Title  The patient will improve berg balance score by 8 points from baseline (established for STGs).    Baseline  30/56 at baseline.    Time  8    Period  Weeks      PT LONG TERM GOAL #3   Title  The patient will reduce DHI from 72% to < or equal to 55% to demo dec'd self perception of dizziness    Time  8    Period  Weeks      PT LONG TERM GOAL #4   Title  The patient will improve gait speed from 1.74 ft/sec to > equal to 2.6 ft/sec to demo improving community gait.    Baseline  2.50 ft/sec    Time  8    Period  Weeks    Status  Partially Met      PT LONG TERM GOAL #5   Title  The patient will ambulate short, community distances x 500 ft with SPC mod indep.    Baseline  Walks 345 ft nonstop today.    Time  8    Period  Weeks    Status  Partially Met            Plan - 02/07/19 0906    Clinical Impression Statement  The patient has met 4 STGs and partially met STG for gait speed.  She is holding remaining visits at this time to self quaranting and decrease community outings due to [place] virus.  Patient wants clinic to contact her in mid April to reschedule remaining visits.      PT Treatment/Interventions  ADLs/Self Care Home Management;Therapeutic activities;Therapeutic exercise;Balance training;Neuromuscular re-education;Gait training;Stair training;Functional mobility training;Patient/family education;Manual techniques;Vestibular;Canalith Repostioning    PT Next Visit Plan  Added patient to clinician's april call back list.  Recommended she contact us if she doesn't hear from our clinic when ready to return.    PT Home Exercise Plan  [id number]    Consulted and Agree with Plan of Care  Patient       Patient will benefit from skilled therapeutic intervention in  order to improve the following deficits and impairments:  Abnormal gait, Decreased activity tolerance, Decreased balance, Decreased mobility, Decreased strength, Dizziness, Decreased endurance, Pain  Visit Diagnosis: Unsteadiness on feet  Dizziness and giddiness  Other abnormalities of gait and mobility  Muscle weakness (generalized)     Problem List Patient Active Problem List   Diagnosis Date Noted  . Prolonged Q-T interval on ECG 04/27/2018  . CHF (congestive heart failure) ([place]) 04/27/2018  . Weakness 04/03/2018  . Syncope 04/03/2018  . Renal insufficiency 03/16/2018  . Diabetes mellitus type 2 in obese ([place]) 03/16/2018  . Morbid obesity ([place])   . CVA (cerebral vascular accident) ([place]) 03/15/2018  . Numbness in both hands- and Feet 06/06/2014  . Weakness of both legs 06/06/2014  . Swelling of limb-Left Leg > Right 06/06/2014  . Peripheral vascular disease, unspecified ([place]) 11/30/2012  . Pain in limb 11/30/2012  . Atherosclerosis of native arteries of the extremities with  intermittent claudication 04/06/2012  . CHEST PAIN 01/03/2011  . Hyperlipidemia 07/20/2007  . Essential hypertension, benign 07/20/2007  . ARTHRITIS 07/20/2007    Alicia [name], PT 02/07/2019, 9:08 AM  [facility] [address] [place], [place], [address] Phone: [phone]   Fax:  [phone]  Name: TIKA HANNIS MRN: [id number] Date of Birth: 08-14-1951

## 2019-02-07 NOTE — Progress Notes (Signed)
3/19/202011:08 AM  Alicia [name] 02-Jul-1951, 68 y.o. female [id number]  Chief Complaint  Patient presents with  . Hypertension  . Diabetes  . Medication Refill    metoprolol 76m, accuchk dm strips    HPI:   Patient is a 68y.o. female with past medical history significant for HTN, DM2, HLP, PAD w hx L fem-pop bypass, CVA, dCHF, smokerwho presents today for routine followup  Chart review Last OV dec 2019 Since that OV has had revascularization of LLE in jan 2020, on plavix, had followup visit Has been going to PT for balance, dizziness and strength  She is overall doing well LLE swelling getting better She feels that PT is helping with strength, not so much with dizziness. She has postponed sessions for next 2 weeks, has HEP instructions Using her cane No falls Checks cbgs fasting, ~ 142 Has been eating more sweets of recent Has no acute concerns today  Lab Results  Component Value Date   HGBA1C 7.1 (H) 10/09/2018   HGBA1C 7.8 (H) 06/21/2018   HGBA1C 7.7 (H) 03/16/2018   Lab Results  Component Value Date   LDLCALC 101 (H) 05/28/2018   CREATININE 1.20 (H) 12/17/2018   Fall Risk  02/07/2019 11/12/2018 10/09/2018 07/19/2018 06/21/2018  Falls in the past year? 0 0 0 No No  Number falls in past yr: 0 - - - -  Injury with Fall? 0 - - - -     [facility] 02/07/2019 11/12/2018 10/09/2018  Decreased Interest 0 0 0  Down, Depressed, Hopeless 0 0 0  PHQ - 2 Score 0 0 0    Allergies  Allergen Reactions  . Nifedipine Er Other (See Comments)    Caused nose bleeds in higher doses (90 mg., namely)  . Latex Rash    Prior to Admission medications   Medication Sig Start Date End Date Taking? Authorizing Provider  acetaminophen (TYLENOL) 500 MG tablet Take 1,000 mg by mouth every 6 (six) hours as needed for moderate pain or headache.    Yes [provider]  aspirin 81 MG EC tablet Take 1 tablet (81 mg total) by mouth daily. 04/06/18  Yes  SRutherford Guys MD  atorvastatin (LIPITOR) 80 MG tablet Take 1 tablet (80 mg total) by mouth daily at 6 PM. 10/09/18  Yes SRutherford Guys MD  clopidogrel (PLAVIX) 75 MG tablet Take 1 tablet (75 mg total) by mouth daily. 12/17/18 12/17/19 Yes CWaynetta Sandy MD  enalapril (VASOTEC) 20 MG tablet TAKE 1 TABLET BY MOUTH TWICE DAILY Patient taking differently: Take 20 mg by mouth 2 (two) times daily.  11/08/18  Yes SRutherford Guys MD  fluticasone (FLONASE) 50 MCG/ACT nasal spray Place 1 spray into both nostrils 2 (two) times daily. Patient taking differently: Place 1 spray into both nostrils 2 (two) times daily as needed for allergies or rhinitis.  06/21/18  Yes SRutherford Guys MD  metFORMIN (GLUCOPHAGE) 1000 MG tablet Take 1 tablet (1,000 mg total) by mouth 2 (two) times daily with a meal. 04/06/18  Yes SRutherford Guys MD  metoprolol succinate (TOPROL-XL) 50 MG 24 hr tablet Take 50 mg by mouth daily.  04/26/18  Yes [provider]  NIFEdipine (ADALAT CC) 60 MG 24 hr tablet Take 1 tablet (60 mg total) by mouth daily. 10/09/18  Yes SRutherford Guys MD  spironolactone (ALDACTONE) 25 MG tablet Take 1 tablet (25 mg total) by mouth daily. 10/09/18  Yes SRutherford Guys MD  Past Medical History:  Diagnosis Date  . Arthritis   . Chronic headaches   . Constipation   . Depression   . Diabetes mellitus   . Hyperlipidemia   . Hypertension   . Obesity   . PAD (peripheral artery disease) ([place])   . Shortness of breath   . Stroke [facility])     Past Surgical History:  Procedure Laterality Date  . ABDOMINAL AORTOGRAM W/LOWER EXTREMITY N/A 12/17/2018   Procedure: ABDOMINAL AORTOGRAM W/LOWER EXTREMITY;  Surgeon: Waynetta Sandy, MD;  Location: [facility] [place] [facility];  Service: Cardiovascular;  Laterality: N/A;  . FEMORAL BYPASS  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 Bridgett Larsson  . PERIPHERAL VASCULAR ATHERECTOMY Left 12/17/2018   Procedure: PERIPHERAL  VASCULAR ATHERECTOMY;  Surgeon: Waynetta Sandy, MD;  Location: [place] [facility];  Service: Cardiovascular;  Laterality: Left;  . PERIPHERAL VASCULAR BALLOON ANGIOPLASTY Left 12/17/2018   Procedure: PERIPHERAL VASCULAR BALLOON ANGIOPLASTY;  Surgeon: Waynetta Sandy, MD;  Location: [place] [facility];  Service: Cardiovascular;  Laterality: Left;    Social History   Tobacco Use  . Smoking status: Light Tobacco Smoker    Packs/day: 0.50    Years: 35.00    Pack years: 17.50    Types: Cigarettes  . Smokeless tobacco: Never Used  . Tobacco comment: patient is trying to quit and has gone down to half a pack per day  Substance Use Topics  . Alcohol use: Yes    Alcohol/week: 1.0 standard drinks    Types: 1 Standard drinks or equivalent per week    Comment: occasional use only    Family History  Problem Relation Age of Onset  . Diabetes Father   . Heart disease Mother        NOT before age 56-  Bypass  . Hypertension Mother   . Hypertension Sister   . Varicose Veins Brother   . Heart disease Brother        Before age 12  . Hypertension Daughter     Review of Systems  Constitutional: Negative for chills and fever.  Respiratory: Negative for cough and shortness of breath.   Cardiovascular: Positive for leg swelling (LLE ). Negative for chest pain and palpitations.  Gastrointestinal: Negative for abdominal pain, nausea and vomiting.     OBJECTIVE:  Blood pressure 138/84, pulse 100, temperature 98.3 F (36.8 C), temperature source Oral, height 5' 7"  (1.702 m), weight 258 lb (117 kg), SpO2 94 %. Body mass index is 40.41 kg/m.    Physical Exam Vitals signs and nursing note reviewed.  Constitutional:      Appearance: She is well-developed.  HENT:     Head: Normocephalic and atraumatic.     Mouth/Throat:     Pharynx: No oropharyngeal exudate.  Eyes:     General: No scleral icterus.    Conjunctiva/sclera: Conjunctivae normal.     Pupils: Pupils are  equal, round, and reactive to light.  Neck:     Musculoskeletal: Neck supple.  Cardiovascular:     Rate and Rhythm: Normal rate and regular rhythm.     Heart sounds: Normal heart sounds. No murmur. No friction rub. No gallop.   Pulmonary:     Effort: Pulmonary effort is normal.     Breath sounds: Normal breath sounds. No wheezing or rales.  Musculoskeletal:     Right lower leg: No edema.     Left lower leg: Edema (pitting + 1) present.  Lymphadenopathy:     Cervical: No cervical adenopathy.  Skin:    General: Skin is warm and dry.  Neurological:     Mental Status: She is alert and oriented to person, place, and time.     Results for orders placed or performed in visit on 02/07/19 (from the past 24 hour(s))  POCT glycosylated hemoglobin (Hb A1C)     Status: Abnormal   Collection Time: 02/07/19 11:41 AM  Result Value Ref Range   Hemoglobin A1C 7.5 (A) 4.0 - 5.6 %   HbA1c POC (<> result, manual entry)     HbA1c, POC (prediabetic range)     HbA1c, POC (controlled diabetic range)       ASSESSMENT and PLAN  1. Diabetes mellitus type 2 in obese (HCC) Uncontrolled. Discussed LFM. Adding renally dosed [place]. Reviewed r/se/b - Lipid panel - CMP14+EGFR - POCT glycosylated hemoglobin (Hb A1C)  2. Essential hypertension, benign Controlled. Continue current regime.  - Lipid panel - CMP14+EGFR  3. Postmenopausal estrogen deficiency - DG Bone Density; Future  4. Peripheral vascular disease, unspecified ([place]) S/p revascularization of LLE, on plavix. Doing well  Other orders - atorvastatin (LIPITOR) 80 MG tablet; Take 1 tablet (80 mg total) by mouth daily at 6 PM. - enalapril (VASOTEC) 20 MG tablet; Take 1 tablet (20 mg total) by mouth 2 (two) times daily. - metoprolol succinate (TOPROL-XL) 50 MG 24 hr tablet; Take 1 tablet (50 mg total) by mouth daily. - NIFEdipine (ADALAT CC) 60 MG 24 hr tablet; Take 1 tablet (60 mg total) by mouth daily. - spironolactone (ALDACTONE) 25 MG  tablet; Take 1 tablet (25 mg total) by mouth daily.   Return in about 3 months (around 05/10/2019) for chronic medical conditions.      Rutherford Guys, MD [facility] [place], [place] [address] Ph.  [phone] Fax [phone]

## 2019-02-08 ENCOUNTER — Ambulatory Visit: Payer: Medicare HMO | Admitting: Rehabilitative and Restorative Service Providers"

## 2019-02-08 LAB — CMP14+EGFR
ALT: 8 IU/L (ref 0–32)
AST: 12 IU/L (ref 0–40)
Albumin/Globulin Ratio: 1.2 (ref 1.2–2.2)
Albumin: 3.8 g/dL (ref 3.8–4.8)
Alkaline Phosphatase: 126 IU/L — ABNORMAL HIGH (ref 39–117)
BUN/Creatinine Ratio: 11 — ABNORMAL LOW (ref 12–28)
BUN: 15 mg/dL (ref 8–27)
Bilirubin Total: 0.3 mg/dL (ref 0.0–1.2)
CO2: 22 mmol/L (ref 20–29)
Calcium: 9 mg/dL (ref 8.7–10.3)
Chloride: 103 mmol/L (ref 96–106)
Creatinine, Ser: 1.33 mg/dL — ABNORMAL HIGH (ref 0.57–1.00)
GFR calc Af Amer: 47 mL/min/{1.73_m2} — ABNORMAL LOW (ref >59)
GFR calc non Af Amer: 41 mL/min/{1.73_m2} — ABNORMAL LOW (ref >59)
Globulin, Total: 3.2 g/dL (ref 1.5–4.5)
Glucose: 161 mg/dL — ABNORMAL HIGH (ref 65–99)
Potassium: 4.1 mmol/L (ref 3.5–5.2)
Sodium: 141 mmol/L (ref 134–144)
Total Protein: 7 g/dL (ref 6.0–8.5)

## 2019-02-08 LAB — LIPID PANEL
Chol/HDL Ratio: 5 ratio — ABNORMAL HIGH (ref 0.0–4.4)
Cholesterol, Total: 241 mg/dL — ABNORMAL HIGH (ref 100–199)
HDL: 48 mg/dL (ref >39)
LDL Calculated: 158 mg/dL — ABNORMAL HIGH (ref 0–99)
Triglycerides: 177 mg/dL — ABNORMAL HIGH (ref 0–149)
VLDL Cholesterol Cal: 35 mg/dL (ref 5–40)

## 2019-02-12 ENCOUNTER — Telehealth: Payer: Self-pay | Admitting: Rehabilitative and Restorative Service Providers"

## 2019-02-12 NOTE — Telephone Encounter (Signed)
Alicia [name] was contacted today regarding the temporary closing of OP Rehab Services due to Covid-19.  Therapist discussed:  Continue home exercise program.    Patient is interested in further information for an e-visit, virtual check in, or telehealth visit, if those services become available.    OP Rehabilitation Services will follow up with patients when we are able to resume care.  [place], [place] [address] [place], [place]  [address] Phone:  [phone] Fax:  [phone]

## 2019-02-13 ENCOUNTER — Ambulatory Visit: Payer: Medicare HMO | Admitting: Rehabilitative and Restorative Service Providers"

## 2019-02-19 ENCOUNTER — Encounter: Payer: Medicare HMO | Admitting: Physical Therapy

## 2019-02-20 ENCOUNTER — Telehealth: Payer: Self-pay | Admitting: Physical Therapy

## 2019-02-20 NOTE — Telephone Encounter (Signed)
Alicia [name] was contacted today regarding the temporary reduction of [facility] OP Rehab Services due to concerns for community transmission of Covid-19.  Therapist advised [name] to cont. With her HEP and assured she had no unanswered questions at this time.   Pt is interested in telehealth visit but states she does not not how to use her computer to be able to participate in such services at this time, therefore, declines this service.  Neuro OP Rehab will follow up with this client when we are able to safely resume care at [facility] in person.   Pt is aware we can be reached by telephone during limited business hours in meantime.  Guido Sander, [place], [place] [address] [phone]

## 2019-02-22 ENCOUNTER — Encounter: Payer: Medicare HMO | Admitting: Physical Therapy

## 2019-02-26 ENCOUNTER — Ambulatory Visit: Payer: Medicare HMO | Admitting: Physical Therapy

## 2019-03-20 ENCOUNTER — Telehealth: Payer: Self-pay

## 2019-03-20 NOTE — Telephone Encounter (Signed)
I tried to call pt but her phone just rang no vm came on. Need to inform pt that her visit will be change to video visit due to COVID 19.

## 2019-03-25 NOTE — Telephone Encounter (Signed)
I called pt that her visit will be change to video due to COVID 19. We are not dong in office visits at this time. Pt gave verbal consent to do video and to file insurance. RN updated med list and pharmacy, and PCP in chart. I explain she will get a text to her cell phone at [phone] day of visit. Once the text message comes just click on link and type in her first and last name and make sure audio is turn on. I advise pt to click on link 5 minutes prior to visit. Pt verbalized understanding.

## 2019-03-26 ENCOUNTER — Telehealth: Payer: Self-pay | Admitting: Adult Health

## 2019-03-26 ENCOUNTER — Other Ambulatory Visit: Payer: Self-pay

## 2019-03-26 ENCOUNTER — Ambulatory Visit: Payer: Medicare HMO | Admitting: Adult Health

## 2019-03-26 NOTE — Telephone Encounter (Signed)
Pt called and stated she wanted to cancel her appt. I asked for the reason and she said that she can not access the program that she needs to do her virtual visit. I asked her if I could assist in helping her through it and if she had received the text and she said she does not know how to do it. I informed the pt that I can coach her through it and she said she just wanted to cancel the appt.

## 2019-03-26 NOTE — Telephone Encounter (Signed)
Noted! Thank you

## 2019-03-26 NOTE — Progress Notes (Deleted)
[facility] [address] [place]. [place] [address] [phone]       FOLLOW UP NOTE  [name] Date of Birth:  08-15-1951 Medical Record Number:  [id number]   Reason for visit: Stroke follow up  Virtual Visit via Video Note  I connected with Alicia [name] on 03/26/19 at 12:45 PM EDT by a video enabled telemedicine application located remotely in my own home and verified that I am speaking with the correct person using two identifiers who was located at their own home.   I discussed the limitations of evaluation and management by telemedicine and the availability of in person appointments. The patient expressed understanding and agreed to proceed.    CHIEF COMPLAINT:  No chief complaint on file.     HPI: Alicia [name] is a 68 y.o. 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  She did have follow-up visit scheduled today for stroke follow-up but due to COVID-19 safety precautions, visit transitioned to telemedicine via [email or web address] with patient's consent.  She has been stable from a stroke standpoint without residual deficits of generalized lower extremity weakness, balance difficulties and dizziness.  She was previously participating in physical therapy with overall improvement but currently on hold due to COVID-19 safety precautions.  Continues on atorvastatin 80 mg.  She did undergo arthrectomy and angioplasty of her left TP trunk by Dr. Donzetta Matters vascular surgery.  She has continued on Plavix since this time without bleeding or bruising.  Blood pressure ***.  She did have visit scheduled with Dr. Rexene Alberts in 08/2018 for OSA evaluation but this was canceled by patient.    Interval history 07/19/2018: Patient is being seen today for scheduled follow-up appointment.  She does have continued complaints of bilateral lower extremity pain.  This pain limits the  amount of activities and exercise she can tolerate therefore feels as though she is feeling weaker in both of her lower extremities.  She is currently using a cane for ambulation and does deny any recent falls.  She has completed all therapies.  Her primary provider recently increased Lipitor dose from 40 mg to 80 mg due to LDL 101 and elevated triglycerides.  Patient is unsure if her leg pains have worsened since this increase in June.  It was at first recommended to stop Lipitor and start Crestor and/or Zetia but patient hesitant due to not wanting to be on additional medications.  It was agreed upon to stop Lipitor at this time for 4 days to see if muscle pains resolve as it is difficult to determine if her pains are due to statin therapy or general deconditioning/arthritis.  If pain is resolved, recommend starting Crestor 40 mg and/or Zetia with close PCP follow-up of cholesterol levels.  Patient understands that if pains do not resolve, to restart Lipitor 80 mg and follow-up with PCP for further work-up of bilateral lower extremity pain that limits daily activity.  Blood pressure elevated at 180/98 and his patient does monitor this at home, it typically runs between 170s 180s.  She was recently seen by PCP beginning of this month but at appointment, blood pressure satisfactory therefore there is no change in medications.  Patient is asymptomatic with elevation of blood pressure and recommended to follow-up with PCP if blood pressure remains high.  No medication changes for HTN made at today's appointment due to management by PCP.  Continues to take aspirin  81 mg without side effects of bleeding or bruising.  Patient also has complaint of central chest pain that radiates towards her back.  She was recently seen in the hospital due to lightheadedness and did have complaints of this pain but all work-up negative and it was determined to be possibly related to gas.  Patient has not started any medications for this  and recommended to stop at a local pharmacy and obtain gas prevention medication to see if this helps resolve.  Recommended following up with PCP if it does not subside.  Does have an appointment scheduled Dr. Rexene Alberts on 08/21/2018 OSA work-up.  Denies new or worsening stroke/TIA symptoms.  She has been stable from a stroke standpoint    ROS:   14 system review of systems performed and negative with exception of activity change, appetite change, unexpected weight change, excessive sweating, hearing loss, runny nose, eye discharge, blurred vision, chest pain, leg swelling, nausea, snoring, frequency of urination, joint pain, back pain, walking difficulty, moles, itching, dizziness, headache, weakness, passing out and depression  PMH:  Past Medical History:  Diagnosis Date  . Arthritis   . Chronic headaches   . Constipation   . Depression   . Diabetes mellitus   . Hyperlipidemia   . Hypertension   . Obesity   . PAD (peripheral artery disease) ([place])   . Shortness of breath   . Stroke [facility])     PSH:  Past Surgical History:  Procedure Laterality Date  . ABDOMINAL AORTOGRAM W/LOWER EXTREMITY N/A 12/17/2018   Procedure: ABDOMINAL AORTOGRAM W/LOWER EXTREMITY;  Surgeon: Waynetta Sandy, MD;  Location: [facility];  Service: Cardiovascular;  Laterality: N/A;  . FEMORAL BYPASS  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 Bridgett Larsson  . PERIPHERAL VASCULAR ATHERECTOMY Left 12/17/2018   Procedure: PERIPHERAL VASCULAR ATHERECTOMY;  Surgeon: Waynetta Sandy, MD;  Location: [facility];  Service: Cardiovascular;  Laterality: Left;  . PERIPHERAL VASCULAR BALLOON ANGIOPLASTY Left 12/17/2018   Procedure: PERIPHERAL VASCULAR BALLOON ANGIOPLASTY;  Surgeon: Waynetta Sandy, MD;  Location: [facility];  Service: Cardiovascular;  Laterality: Left;    Social History:  Social History   Socioeconomic History  . Marital status: Single     Spouse name: Not on file  . Number of children: Not on file  . Years of education: Not on file  . Highest education level: Not on file  Occupational History  . Not on file  Social Needs  . Financial resource strain: Not on file  . Food insecurity:    Worry: Not on file    Inability: Not on file  . Transportation needs:    Medical: Not on file    Non-medical: Not on file  Tobacco Use  . Smoking status: Light Tobacco Smoker    Packs/day: 0.50    Years: 35.00    Pack years: 17.50    Types: Cigarettes  . Smokeless tobacco: Never Used  . Tobacco comment: patient is trying to quit and has gone down to half a pack per day  Substance and Sexual Activity  . Alcohol use: Yes    Alcohol/week: 1.0 standard drinks    Types: 1 Standard drinks or equivalent per week    Comment: occasional use only  . Drug use: No  . Sexual activity: Not Currently  Lifestyle  . Physical activity:    Days per week: Not on file    Minutes per session:  Not on file  . Stress: Not on file  Relationships  . Social connections:    Talks on phone: Not on file    Gets together: Not on file    Attends religious service: Not on file    Active member of club or organization: Not on file    Attends meetings of clubs or organizations: Not on file    Relationship status: Not on file  . Intimate partner violence:    Fear of current or ex partner: Not on file    Emotionally abused: Not on file    Physically abused: Not on file    Forced sexual activity: Not on file  Other Topics Concern  . Not on file  Social History Narrative  . Not on file    Family History:  Family History  Problem Relation Age of Onset  . Diabetes Father   . Heart disease Mother        NOT before age 40-  Bypass  . Hypertension Mother   . Hypertension Sister   . Varicose Veins Brother   . Heart disease Brother        Before age 88  . Hypertension Daughter     Medications:   Current Outpatient Medications on File Prior to Visit   Medication Sig Dispense Refill  . acetaminophen (TYLENOL) 500 MG tablet Take 1,000 mg by mouth every 6 (six) hours as needed for moderate pain or headache.     Marland Kitchen aspirin 81 MG EC tablet Take 1 tablet (81 mg total) by mouth daily. 30 tablet 0  . atorvastatin (LIPITOR) 80 MG tablet Take 1 tablet (80 mg total) by mouth daily at 6 PM. 90 tablet 3  . clopidogrel (PLAVIX) 75 MG tablet Take 1 tablet (75 mg total) by mouth daily. 30 tablet 3  . enalapril (VASOTEC) 20 MG tablet Take 1 tablet (20 mg total) by mouth 2 (two) times daily. 180 tablet 1  . fluticasone (FLONASE) 50 MCG/ACT nasal spray Place 1 spray into both nostrils 2 (two) times daily. (Patient taking differently: Place 1 spray into both nostrils 2 (two) times daily as needed for allergies or rhinitis. ) 16 g 6  . metFORMIN (GLUCOPHAGE) 1000 MG tablet Take 1 tablet (1,000 mg total) by mouth 2 (two) times daily with a meal. 180 tablet 1  . metoprolol succinate (TOPROL-XL) 50 MG 24 hr tablet Take 1 tablet (50 mg total) by mouth daily. 90 tablet 1  . NIFEdipine (ADALAT CC) 60 MG 24 hr tablet Take 1 tablet (60 mg total) by mouth daily. 90 tablet 1  . sitaGLIPtin (JANUVIA) 50 MG tablet Take 1 tablet (50 mg total) by mouth daily. 90 tablet 1  . spironolactone (ALDACTONE) 25 MG tablet Take 1 tablet (25 mg total) by mouth daily. 90 tablet 1   No current facility-administered medications on file prior to visit.     Allergies:   Allergies  Allergen Reactions  . Nifedipine Er Other (See Comments)    Caused nose bleeds in higher doses (90 mg., namely)  . Latex Rash     Physical Exam  There were no vitals filed for this visit. There is no height or weight on file to calculate BMI. No exam data present  General: well developed, obese pleasant African-American female, well nourished, seated, in no evident distress Head: head normocephalic and atraumatic.   Neck: supple with no carotid or supraclavicular bruits Cardiovascular: regular rate and  rhythm, no murmurs Musculoskeletal: no deformity Skin:  no rash/petichiae Vascular:  Normal pulses all extremities  Neurologic Exam Mental Status: Awake and fully alert. Oriented to place and time. Recent and remote memory intact. Attention span, concentration and fund of knowledge appropriate. Mood and affect appropriate.  Cranial Nerves: Fundoscopic exam deferred. Pupils equal, briskly reactive to light. Extraocular movements full without nystagmus. Visual fields full to confrontation. Hearing intact. Facial sensation intact. Face, tongue, palate moves normally and symmetrically.  Motor: Normal bulk and tone. Normal strength in all tested extremity muscles except for mild bilateral hip flexor weakness with some possible giveaway weakness possibly due to pain Sensory.: intact to touch , pinprick , position and vibratory sensation.  Coordination: Rapid alternating movements normal in all extremities. Finger-to-nose and heel-to-shin performed accurately bilaterally. Gait and Station: Arises from chair with mild difficulty. Stance is normal. Gait demonstrates wobbling stride with assistance of cane.   Reflexes: 1+ and symmetric. Toes downgoing.      Diagnostic Data (Labs, Imaging, Testing)  MR BRAIN WO CONTRAST 03/15/2018 IMPRESSION: 1. 5 mm acute/early subacute infarction within the right mid corona radiata. No associated hemorrhage or mass effect. 2. Moderate for age chronic microvascular ischemic changes and parenchymal volume loss of the brain. 3. Small chronic infarctions in the right cerebellum, mid pons, and left basal ganglia. 4. Foci of chronic microhemorrhage in central distribution likely related to chronic hypertension.  CT ANGIO NECK W OR WO CONTRAST CT ANGIO HEAD W OR WO CONTRAST CT HEAD WO CONTRAST 03/23/2018 IMPRESSION: 1. No acute intracranial abnormality. 2. Stable remote lacunar infarct of the left thalamus. 3. Stable remote right cerebellar infarcts. 4. Stable  chronic white matter disease. 5. ASPECTS is 10/10  ECHOCARDIOGRAM 03/16/2018 Study Conclusions - Left ventricle: The cavity size was normal. Wall thickness was   increased in a pattern of mild LVH. Systolic function was normal.   The estimated ejection fraction was in the range of 55% to 60%.   Wall motion was normal; there were no regional wall motion   abnormalities. Doppler parameters are consistent with abnormal   left ventricular relaxation (grade 1 diastolic dysfunction). - Aortic valve: There was mild regurgitation. Valve area (VTI):   2.24 cm^2. Valve area (Vmax): 2.17 cm^2. Valve area (Vmean): 2.11   cm^2. - Right atrium: The atrium was moderately dilated.     ASSESSMENT: Alicia [name] is a 68 y.o. year old female here with right CR infarct on 03/15/18 secondary to small vessel disease. Vascular risk factors include HTN, HLD, DM and PAD.  Patient returns today for scheduled follow-up visit and has been stable from stroke standpoint but does continue to complain of bilateral lower extremity pain and weakness.  Differential diagnosis includes residual stroke deficit, deconditioning, statin myalgia or arthritis.    PLAN: -Continue aspirin 81 mg daily for secondary stroke prevention -Agreed upon to stop Lipitor 80 mg for 4 days to trial if bilateral lower extremity pain due to statin use.  Advised that if after 4 days, the pain does not subside she is to restart Lipitor 80 mg.  If pain does subside, we will consider starting Crestor and/or Zetia for HLD management with continued PCP follow-up for lipid panel levels -F/u with PCP regarding your HLD, HTN and DM management -advised patient to speak to PCP regarding blood pressure management as blood pressure continues to be elevated -continue to monitor BP at home -Continue exercise as tolerated and healthy diet -information regarding healthy diet for cholesterol management provided -f/u with Dr. Rexene Alberts on 08/21/2018 for OSA  work-up -Maintain strict control of hypertension with blood pressure goal below 130/90, diabetes with hemoglobin A1c goal below 6.5% and cholesterol with LDL cholesterol (bad cholesterol) goal below 70 mg/dL. I also advised the patient to eat a healthy diet with plenty of whole grains, cereals, fruits and vegetables, exercise regularly and maintain ideal body weight.  Follow up in 6 months or call earlier if needed   Greater than 50% of time during this 25 minute visit was spent on counseling,explanation of diagnosis of right CR infarct, reviewing risk factor management of HLD, HTN and DM, planning of further management, discussion with patient and family and coordination of care    Venancio Poisson, [facility]  [facility] [address] [place], [place] [address]  Phone [phone] Fax [phone]

## 2019-04-22 ENCOUNTER — Emergency Department (HOSPITAL_COMMUNITY): Payer: Medicare HMO

## 2019-04-22 ENCOUNTER — Other Ambulatory Visit: Payer: Self-pay

## 2019-04-22 ENCOUNTER — Emergency Department (HOSPITAL_COMMUNITY)
Admission: EM | Admit: 2019-04-22 | Discharge: 2019-04-22 | Disposition: A | Discharge status: Home / Self Care | Payer: Medicare HMO | Attending: Emergency Medicine | Admitting: Emergency Medicine

## 2019-04-22 ENCOUNTER — Encounter (HOSPITAL_COMMUNITY): Payer: Self-pay | Admitting: *Deleted

## 2019-04-22 DIAGNOSIS — Z91040 Latex allergy status: Secondary | ICD-10-CM | POA: Insufficient documentation

## 2019-04-22 DIAGNOSIS — I509 Heart failure, unspecified: Secondary | ICD-10-CM | POA: Diagnosis not present

## 2019-04-22 DIAGNOSIS — K5730 Diverticulosis of large intestine without perforation or abscess without bleeding: Secondary | ICD-10-CM | POA: Insufficient documentation

## 2019-04-22 DIAGNOSIS — E119 Type 2 diabetes mellitus without complications: Secondary | ICD-10-CM | POA: Insufficient documentation

## 2019-04-22 DIAGNOSIS — F17210 Nicotine dependence, cigarettes, uncomplicated: Secondary | ICD-10-CM | POA: Diagnosis not present

## 2019-04-22 DIAGNOSIS — Z7982 Long term (current) use of aspirin: Secondary | ICD-10-CM | POA: Insufficient documentation

## 2019-04-22 DIAGNOSIS — I110 Hypertensive heart disease with heart failure: Secondary | ICD-10-CM | POA: Insufficient documentation

## 2019-04-22 DIAGNOSIS — I2510 Atherosclerotic heart disease of native coronary artery without angina pectoris: Secondary | ICD-10-CM | POA: Insufficient documentation

## 2019-04-22 DIAGNOSIS — Z7984 Long term (current) use of oral hypoglycemic drugs: Secondary | ICD-10-CM | POA: Diagnosis not present

## 2019-04-22 DIAGNOSIS — Z79899 Other long term (current) drug therapy: Secondary | ICD-10-CM | POA: Insufficient documentation

## 2019-04-22 DIAGNOSIS — Z8673 Personal history of transient ischemic attack (TIA), and cerebral infarction without residual deficits: Secondary | ICD-10-CM | POA: Insufficient documentation

## 2019-04-22 DIAGNOSIS — K5790 Diverticulosis of intestine, part unspecified, without perforation or abscess without bleeding: Secondary | ICD-10-CM

## 2019-04-22 DIAGNOSIS — R1032 Left lower quadrant pain: Secondary | ICD-10-CM | POA: Diagnosis not present

## 2019-04-22 LAB — COMPREHENSIVE METABOLIC PANEL
ALT: 9 U/L (ref 0–44)
AST: 10 U/L — ABNORMAL LOW (ref 15–41)
Albumin: 3.2 g/dL — ABNORMAL LOW (ref 3.5–5.0)
Alkaline Phosphatase: 110 U/L (ref 38–126)
Anion gap: 10 (ref 5–15)
BUN: 14 mg/dL (ref 8–23)
CO2: 26 mmol/L (ref 22–32)
Calcium: 9 mg/dL (ref 8.9–10.3)
Chloride: 106 mmol/L (ref 98–111)
Creatinine, Ser: 1.25 mg/dL — ABNORMAL HIGH (ref 0.44–1.00)
GFR calc Af Amer: 51 mL/min — ABNORMAL LOW (ref >60)
GFR calc non Af Amer: 44 mL/min — ABNORMAL LOW (ref >60)
Glucose, Bld: 163 mg/dL — ABNORMAL HIGH (ref 70–99)
Potassium: 3.9 mmol/L (ref 3.5–5.1)
Sodium: 142 mmol/L (ref 135–145)
Total Bilirubin: 0.4 mg/dL (ref 0.3–1.2)
Total Protein: 6.5 g/dL (ref 6.5–8.1)

## 2019-04-22 LAB — URINALYSIS, ROUTINE W REFLEX MICROSCOPIC
Bilirubin Urine: NEGATIVE
Glucose, UA: NEGATIVE mg/dL
Ketones, ur: NEGATIVE mg/dL
Leukocytes,Ua: NEGATIVE
Nitrite: NEGATIVE
Protein, ur: 100 mg/dL — AB
Specific Gravity, Urine: 1.009 (ref 1.005–1.030)
pH: 7 (ref 5.0–8.0)

## 2019-04-22 LAB — CBC
HCT: 40.8 % (ref 36.0–46.0)
Hemoglobin: 13 g/dL (ref 12.0–15.0)
MCH: 29.2 pg (ref 26.0–34.0)
MCHC: 31.9 g/dL (ref 30.0–36.0)
MCV: 91.7 fL (ref 80.0–100.0)
Platelets: 319 10*3/uL (ref 150–400)
RBC: 4.45 MIL/uL (ref 3.87–5.11)
RDW: 14.1 % (ref 11.5–15.5)
WBC: 6.2 10*3/uL (ref 4.0–10.5)
nRBC: 0 % (ref 0.0–0.2)

## 2019-04-22 LAB — LIPASE, BLOOD: Lipase: 23 U/L (ref 11–51)

## 2019-04-22 LAB — LACTIC ACID, PLASMA: Lactic Acid, Venous: 1.5 mmol/L (ref 0.5–1.9)

## 2019-04-22 MED ORDER — METRONIDAZOLE 500 MG PO TABS: 500.0000 mg | ORAL_TABLET | Freq: Two times a day (BID) | ORAL | 0 refills | Status: AC

## 2019-04-22 MED ORDER — METOPROLOL SUCCINATE ER 25 MG PO TB24
50.0000 mg | ORAL_TABLET | Freq: Every day | ORAL | Status: DC
  Administered 2019-04-22: 50 mg via ORAL
  Filled 2019-04-22: qty 2

## 2019-04-22 MED ORDER — HYDROMORPHONE HCL 1 MG/ML IJ SOLN: 1.0000 mg | Freq: Once | INTRAMUSCULAR | Status: DC

## 2019-04-22 MED ORDER — ENALAPRIL MALEATE 20 MG PO TABS
20.0000 mg | ORAL_TABLET | Freq: Once | ORAL | Status: AC
  Administered 2019-04-22: 20 mg via ORAL
  Filled 2019-04-22: qty 1

## 2019-04-22 MED ORDER — DICYCLOMINE HCL 20 MG PO TABS: 20.0000 mg | ORAL_TABLET | Freq: Two times a day (BID) | ORAL | 0 refills | Status: DC

## 2019-04-22 MED ORDER — SODIUM CHLORIDE 0.9% FLUSH: 3.0000 mL | Freq: Once | INTRAVENOUS | Status: DC

## 2019-04-22 MED ORDER — CIPROFLOXACIN HCL 500 MG PO TABS: 500.0000 mg | ORAL_TABLET | Freq: Two times a day (BID) | ORAL | 0 refills | Status: AC

## 2019-04-22 NOTE — ED Provider Notes (Signed)
[facility] EMERGENCY DEPARTMENT Provider Note   CSN: [id number] Arrival date & time: 04/22/19  1222    History   Chief Complaint Chief Complaint  Patient presents with  . Abdominal Pain    HPI Alicia [name] is a 68 y.o. female.     The history is provided by the patient and medical records.  Abdominal Pain  Pain location:  LLQ Pain quality: aching, cramping and dull   Pain radiates to:  L flank and back Pain severity:  Moderate Onset quality:  Sudden Duration:  2 days Timing:  Constant Progression:  Worsening Chronicity:  New Context: awakening from sleep   Context: not eating, not medication withdrawal, not recent illness, not recent travel, not sick contacts and not trauma   Relieved by:  Nothing Worsened by:  Palpation, position changes and movement Ineffective treatments:  Not moving, urination, eating, liquids, flatus, cold, bowel activity, heat and lying down Associated symptoms: chills, fatigue and nausea   Associated symptoms: no chest pain, no constipation, no cough, no diarrhea, no dysuria, no fever, no hematemesis, no hematochezia, no hematuria, no melena, no shortness of breath, no vaginal bleeding, no vaginal discharge and no vomiting   Risk factors: NSAID use and obesity     Past Medical History:  Diagnosis Date  . Arthritis   . Chronic headaches   . Constipation   . Depression   . Diabetes mellitus   . Hyperlipidemia   . Hypertension   . Obesity   . PAD (peripheral artery disease) ([place])   . Shortness of breath   . Stroke [facility])     Patient Active Problem List   Diagnosis Date Noted  . Prolonged Q-T interval on ECG 04/27/2018  . CHF (congestive heart failure) ([place]) 04/27/2018  . Weakness 04/03/2018  . Syncope 04/03/2018  . Renal insufficiency 03/16/2018  . Diabetes mellitus type 2 in obese ([place]) 03/16/2018  . Morbid obesity ([place])   . CVA (cerebral vascular accident) ([place]) 03/15/2018  . Numbness in both hands- and  Feet 06/06/2014  . Weakness of both legs 06/06/2014  . Swelling of limb-Left Leg > Right 06/06/2014  . Peripheral vascular disease, unspecified ([place]) 11/30/2012  . Pain in limb 11/30/2012  . Atherosclerosis of native arteries of the extremities with intermittent claudication 04/06/2012  . CHEST PAIN 01/03/2011  . Hyperlipidemia 07/20/2007  . Essential hypertension, benign 07/20/2007  . ARTHRITIS 07/20/2007    Past Surgical History:  Procedure Laterality Date  . ABDOMINAL AORTOGRAM W/LOWER EXTREMITY N/A 12/17/2018   Procedure: ABDOMINAL AORTOGRAM W/LOWER EXTREMITY;  Surgeon: Waynetta Sandy, MD;  Location: [facility] CV LAB;  Service: Cardiovascular;  Laterality: N/A;  . FEMORAL BYPASS  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 Bridgett Larsson  . PERIPHERAL VASCULAR ATHERECTOMY Left 12/17/2018   Procedure: PERIPHERAL VASCULAR ATHERECTOMY;  Surgeon: Waynetta Sandy, MD;  Location: [facility] CV LAB;  Service: Cardiovascular;  Laterality: Left;  . PERIPHERAL VASCULAR BALLOON ANGIOPLASTY Left 12/17/2018   Procedure: PERIPHERAL VASCULAR BALLOON ANGIOPLASTY;  Surgeon: Waynetta Sandy, MD;  Location: [facility] CV LAB;  Service: Cardiovascular;  Laterality: Left;     OB History   No obstetric history on file.      Home Medications    Prior to Admission medications   Medication Sig Start Date End Date Taking? Authorizing Provider  acetaminophen (TYLENOL) 500 MG tablet Take 1,000 mg by mouth every 6 (six) hours as needed for moderate pain or  headache.    Yes [provider]  aspirin 81 MG EC tablet Take 1 tablet (81 mg total) by mouth daily. 04/06/18  Yes Rutherford Guys, MD  enalapril (VASOTEC) 20 MG tablet Take 1 tablet (20 mg total) by mouth 2 (two) times daily. 02/07/19  Yes Rutherford Guys, MD  fluticasone (FLONASE) 50 MCG/ACT nasal spray Place 1 spray into both nostrils 2 (two) times daily. Patient taking differently: Place 1  spray into both nostrils 2 (two) times daily as needed for allergies or rhinitis.  06/21/18  Yes Rutherford Guys, MD  metFORMIN (GLUCOPHAGE) 1000 MG tablet Take 1 tablet (1,000 mg total) by mouth 2 (two) times daily with a meal. 02/07/19  Yes Rutherford Guys, MD  metoprolol succinate (TOPROL-XL) 50 MG 24 hr tablet Take 1 tablet (50 mg total) by mouth daily. 02/07/19  Yes Rutherford Guys, MD  NIFEdipine (ADALAT CC) 60 MG 24 hr tablet Take 1 tablet (60 mg total) by mouth daily. 02/07/19  Yes Rutherford Guys, MD  sitaGLIPtin (JANUVIA) 50 MG tablet Take 1 tablet (50 mg total) by mouth daily. 02/07/19  Yes Rutherford Guys, MD  spironolactone (ALDACTONE) 25 MG tablet Take 1 tablet (25 mg total) by mouth daily. 02/07/19  Yes Rutherford Guys, MD  atorvastatin (LIPITOR) 80 MG tablet Take 1 tablet (80 mg total) by mouth daily at 6 PM. Patient not taking: Reported on 04/22/2019 02/07/19   Rutherford Guys, MD  ciprofloxacin (CIPRO) 500 MG tablet Take 1 tablet (500 mg total) by mouth every 12 (twelve) hours for 10 days. 04/22/19 05/02/19  Lonzo Candy, MD  clopidogrel (PLAVIX) 75 MG tablet Take 1 tablet (75 mg total) by mouth daily. Patient not taking: Reported on 04/22/2019 12/17/18 12/17/19  Waynetta Sandy, MD  metroNIDAZOLE (FLAGYL) 500 MG tablet Take 1 tablet (500 mg total) by mouth 2 (two) times daily for 10 days. 04/22/19 05/02/19  Lonzo Candy, MD    Family History Family History  Problem Relation Age of Onset  . Diabetes Father   . Heart disease Mother        NOT before age 72-  Bypass  . Hypertension Mother   . Hypertension Sister   . Varicose Veins Brother   . Heart disease Brother        Before age 76  . Hypertension Daughter     Social History Social History   Tobacco Use  . Smoking status: Light Tobacco Smoker    Packs/day: 0.50    Years: 35.00    Pack years: 17.50    Types: Cigarettes  . Smokeless tobacco: Never Used  . Tobacco comment: patient is trying to quit and has gone  down to half a pack per day  Substance Use Topics  . Alcohol use: Yes    Alcohol/week: 1.0 standard drinks    Types: 1 Standard drinks or equivalent per week    Comment: occasional use only  . Drug use: No     Allergies   Atorvastatin; Nifedipine er; and Latex   Review of Systems Review of Systems  Constitutional: Positive for chills and fatigue. Negative for fever.  Respiratory: Negative for cough and shortness of breath.   Cardiovascular: Negative for chest pain.  Gastrointestinal: Positive for abdominal pain and nausea. Negative for constipation, diarrhea, hematemesis, hematochezia, melena and vomiting.  Genitourinary: Negative for dysuria, hematuria, vaginal bleeding and vaginal discharge.  All other systems reviewed and are negative.    Physical Exam Updated Vital  Signs BP (!) 230/96   Pulse 79   Temp 98.2 F (36.8 C) (Oral)   Resp (!) 24   Ht 5\' 7"  (1.702 m)   Wt 114.8 kg   SpO2 100%   BMI 39.63 kg/m   Physical Exam Vitals signs and nursing note reviewed.  Constitutional:      Appearance: She is well-developed.  HENT:     Head: Normocephalic and atraumatic.  Eyes:     Conjunctiva/sclera: Conjunctivae normal.  Neck:     Musculoskeletal: Neck supple.  Cardiovascular:     Rate and Rhythm: Normal rate.  Pulmonary:     Effort: Pulmonary effort is normal. No respiratory distress.     Breath sounds: Normal breath sounds.  Abdominal:     General: Abdomen is protuberant.     Palpations: Abdomen is soft.     Tenderness: There is abdominal tenderness in the left lower quadrant. There is left CVA tenderness. There is no right CVA tenderness, guarding or rebound. Negative signs include Murphy's sign, Rovsing's sign and McBurney's sign.  Skin:    General: Skin is warm and dry.     Capillary Refill: Capillary refill takes 2 to 3 seconds.     Comments: 1+ pitting edema BLEs  Well healed LLE scar from previous bypass surgery   Neurological:     Mental Status: She  is alert and oriented to person, place, and time.      ED Treatments / Results  Labs (all labs ordered are listed, but only abnormal results are displayed) Labs Reviewed  COMPREHENSIVE METABOLIC PANEL - Abnormal; Notable for the following components:      Result Value   Glucose, Bld 163 (*)    Creatinine, Ser 1.25 (*)    Albumin 3.2 (*)    AST 10 (*)    GFR calc non Af Amer 44 (*)    GFR calc Af Amer 51 (*)    All other components within normal limits  URINALYSIS, ROUTINE W REFLEX MICROSCOPIC - Abnormal; Notable for the following components:   Color, Urine STRAW (*)    Hgb urine dipstick SMALL (*)    Protein, ur 100 (*)    Bacteria, UA RARE (*)    All other components within normal limits  LIPASE, BLOOD  CBC  LACTIC ACID, PLASMA    EKG None  Radiology Ct Abdomen Pelvis Wo Contrast  Result Date: 04/22/2019 CLINICAL DATA:  Acute left lower quadrant abdominal pain. EXAM: CT ABDOMEN AND PELVIS WITHOUT CONTRAST TECHNIQUE: Multidetector CT imaging of the abdomen and pelvis was performed following the standard protocol without IV contrast. COMPARISON:  CT scan of Mar 28, 2013. FINDINGS: Lower chest: No acute abnormality. Hepatobiliary: Stable left hepatic cyst is noted. No gallstones, gallbladder wall thickening, or biliary dilatation. Pancreas: Unremarkable. No pancreatic ductal dilatation or surrounding inflammatory changes. Spleen: Normal in size without focal abnormality. Adrenals/Urinary Tract: Adrenal glands are unremarkable. Kidneys are normal, without renal calculi, focal lesion, or hydronephrosis. Bladder is unremarkable. Stomach/Bowel: Stomach is within normal limits. Appendix appears normal. No evidence of bowel wall thickening, distention, or inflammatory changes. Diverticulosis of descending and sigmoid colon is noted without inflammation. Vascular/Lymphatic: Aortic atherosclerosis. No enlarged abdominal or pelvic lymph nodes. Reproductive: Uterus and bilateral adnexa are  unremarkable. Other: Moderate size fat containing periumbilical hernia is noted. No ascites is noted. Musculoskeletal: No acute or significant osseous findings. IMPRESSION: Diverticulosis of descending and sigmoid colon is noted without inflammation. Moderate size fat containing periumbilical hernia. No acute abnormality seen  in the abdomen or pelvis. Aortic Atherosclerosis (ICD10-I70.0). Electronically Signed   By: Marijo Conception M.D.   On: 04/22/2019 18:31    Procedures Procedures (including critical care time)  Medications Ordered in ED Medications  sodium chloride flush (NS) 0.9 % injection 3 mL (3 mLs Intravenous Not Given 04/22/19 1601)  HYDROmorphone (DILAUDID) injection 1 mg (1 mg Intravenous Not Given 04/22/19 1651)  metoprolol succinate (TOPROL-XL) 24 hr tablet 50 mg (50 mg Oral Given 04/22/19 1706)  enalapril (VASOTEC) tablet 20 mg (20 mg Oral Given 04/22/19 1706)     Initial Impression / Assessment and Plan / ED Course  I have reviewed the triage vital signs and the nursing notes.  Pertinent labs & imaging results that were available during my care of the patient were reviewed by me and considered in my medical decision making (see chart for details).        Alicia [name] is a 68 y.o. female who presents with LLQ pain  Upon initial assessment patient, calm, alert and interactive, hypertensive, not tachy, no increased wob, abd tender, afebrile, will give pain meds, obtain CT imaging, reassess, will give home anti hypertensives  CMP grossly unremarkable, cbc shows no acute anemia or leukocytosis. CT scan showed diverticulosis, no other emergent abd pathology Urine studies show no obvious infection  Upon reassessing patient, calm, resting comfortably, no new complains, repeat BP 214/102. Will treat for diverticulitis given degree of LLQ pain and discomfort patient is experiencing.  Based on the above information I believe patient is stable for discharge with appropriate  outpatient follow up.  Patient given strict return precaution and instructions to follow up with PCP. Patient expressed understanding and agreement with plan.   The care of this patient was supervised by Dr. Vanita Panda, who agreed with the plan and management of the patient.  Final Clinical Impressions(s) / ED Diagnoses   Final diagnoses:  Diverticulosis    ED Discharge Orders         Ordered    metroNIDAZOLE (FLAGYL) 500 MG tablet  2 times daily     04/22/19 1945    ciprofloxacin (CIPRO) 500 MG tablet  Every 12 hours     04/22/19 1945           Lonzo Candy, MD 04/22/19 1949    Carmin Muskrat, MD 04/24/19 [phone]

## 2019-04-22 NOTE — ED Triage Notes (Signed)
Pt in c/o LLQ abdominal pain, started yesterday and has progressed today, denies n/v/d but reports lack of appetite, has been taking tylenol at home with temporary relief, no distress noted

## 2019-04-23 IMAGING — MR MR HEAD W/O CM
9 of 10 series · 35 of 48 positions shown · non-contrast
Comparison: 03/23/2018 CT head.  03/15/2018 MRI head.

CLINICAL DATA: 67 y/o F; recent admission for stroke. Patient
presents with weakness and lightheadedness.

EXAM:
MRI HEAD WITHOUT CONTRAST
TECHNIQUE: Multiplanar, multiecho pulse sequences of the brain and surrounding
structures were obtained without intravenous contrast.

[Series 3: DWI · axial · 3.0mm · 0.94mm/px · z∈[-104,+41]mm · 9 of 100 slices shown (1 of 2)]
[im 1/100]
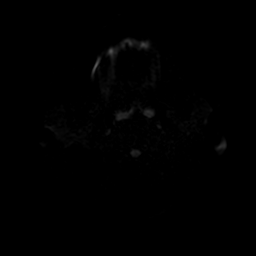
[im 13/100]
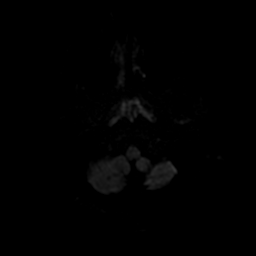
[im 25/100]
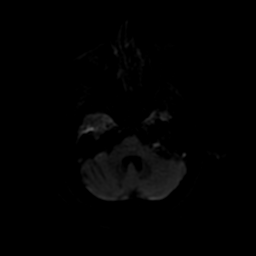
[im 38/100]
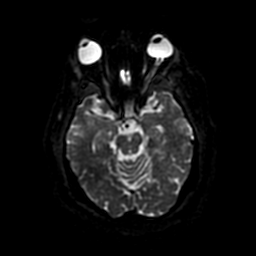
[im 50/100]
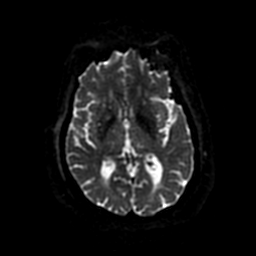
[im 62/100]
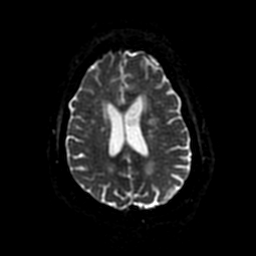
[im 75/100]
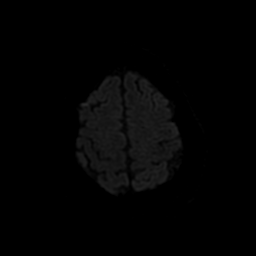
[im 87/100]
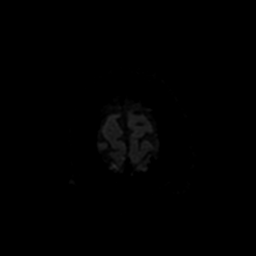
[im 100/100]
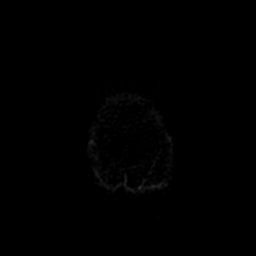

[Series 4: DWI · coronal · 4.0mm · 0.94mm/px · 7 of 72 slices shown (2 of 2)]
[im 1/72]
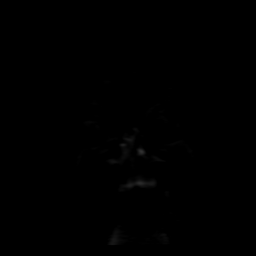
[im 12/72]
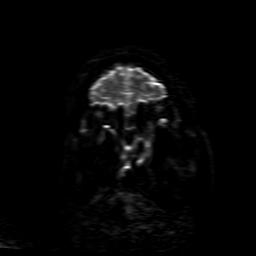
[im 24/72]
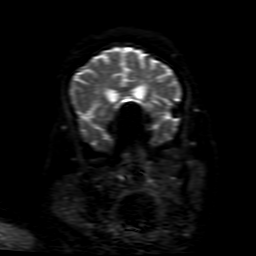
[im 36/72]
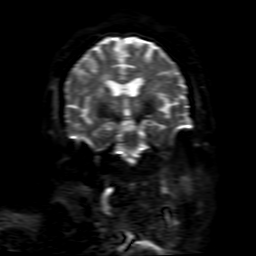
[im 48/72]
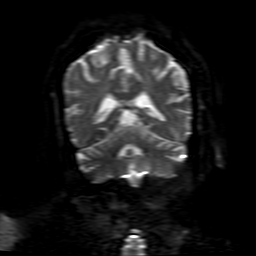
[im 60/72]
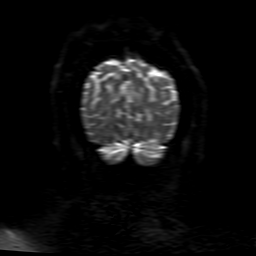
[im 72/72]
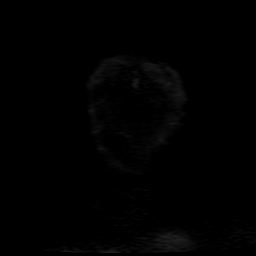

[Series 5: FLAIR · sagittal · 5.0mm · 0.47mm/px · 2 of 23 slices shown (1 of 2)]
[im 1/23]
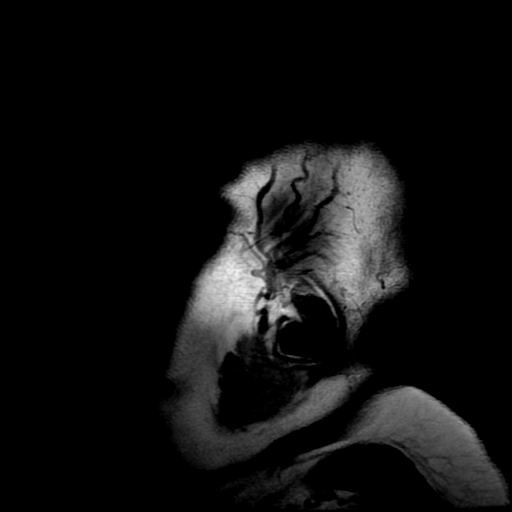
[im 23/23]
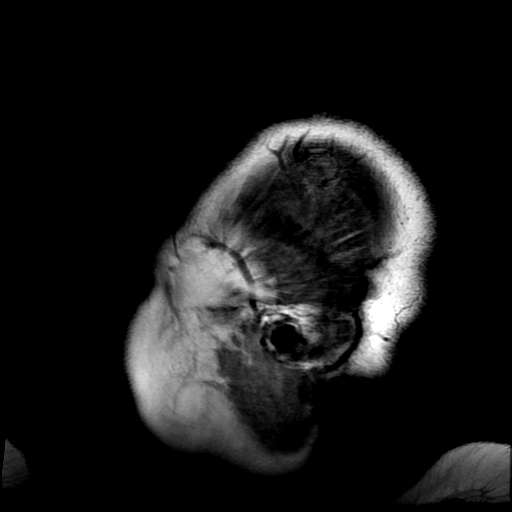

[Series 7: T2 · axial · 5.0mm · 0.43mm/px · z∈[-104,+45]mm · 2 of 26 slices shown (1 of 2)]
[im 1/26]
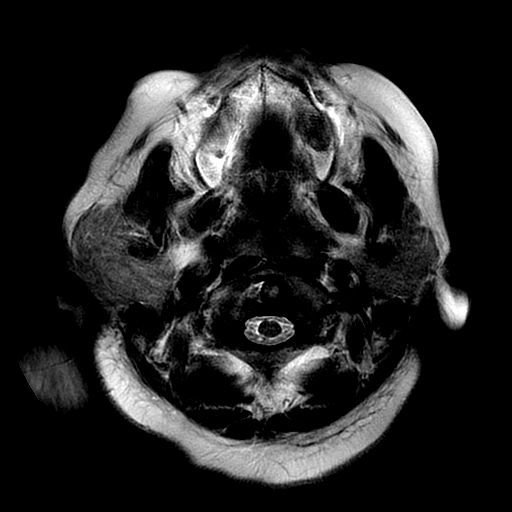
[im 26/26]
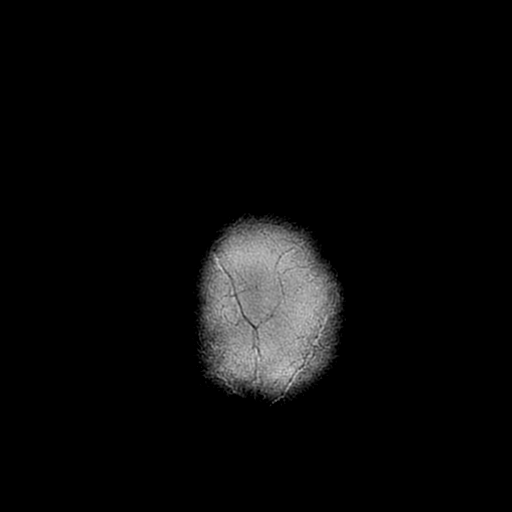

[Series 8: FLAIR · axial · 3.0mm · 0.43mm/px · z∈[-104,+45]mm · 2 of 26 slices shown (2 of 2)]
[im 1/26]
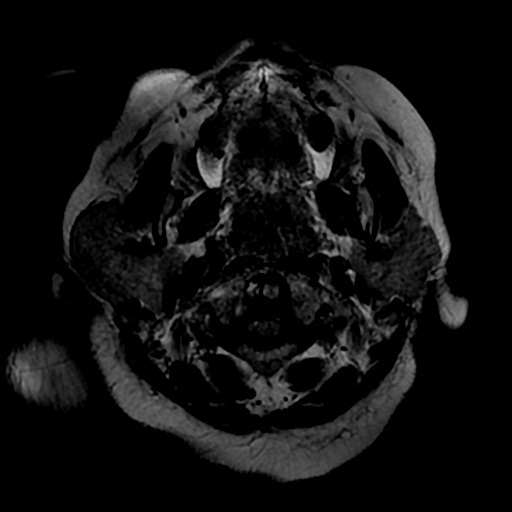
[im 26/26]
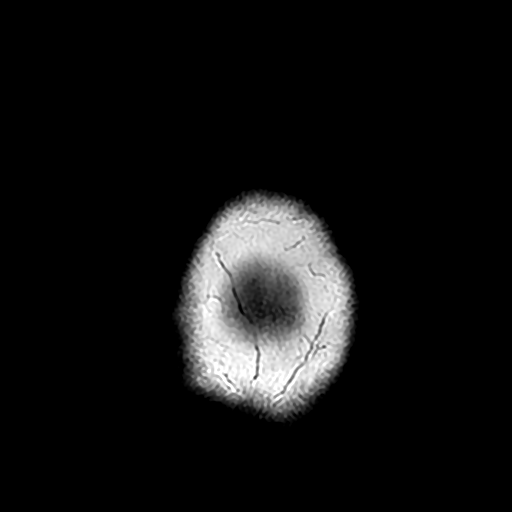

[Series 9: (person_name) · axial · 3.0mm · 0.47mm/px · z∈[-107,-90]mm · 2 of 104 slices shown]
[im 1/104]
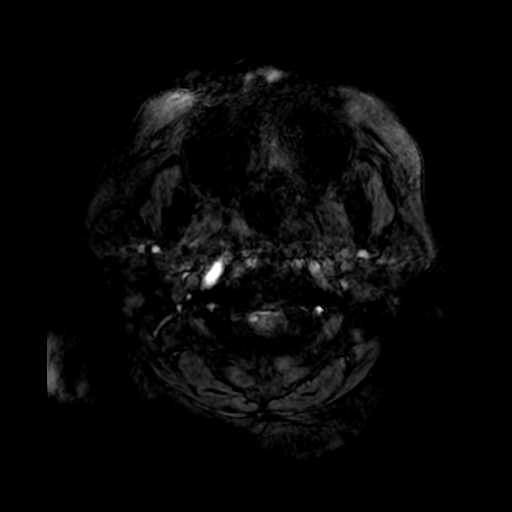
[im 12/104]
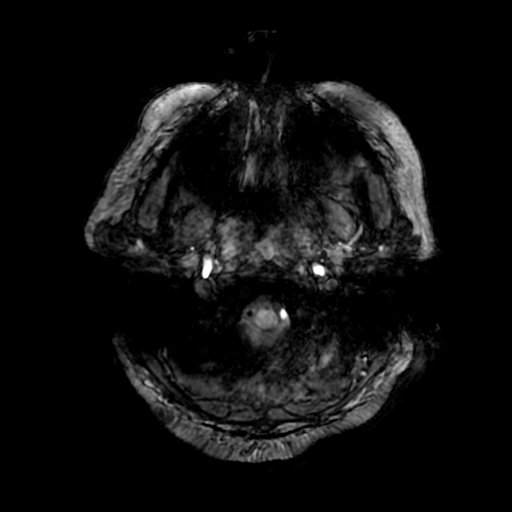

[Series 11: T2 · coronal · 5.0mm · 0.39mm/px · 3 of 28 slices shown (2 of 2)]
[im 1/28]
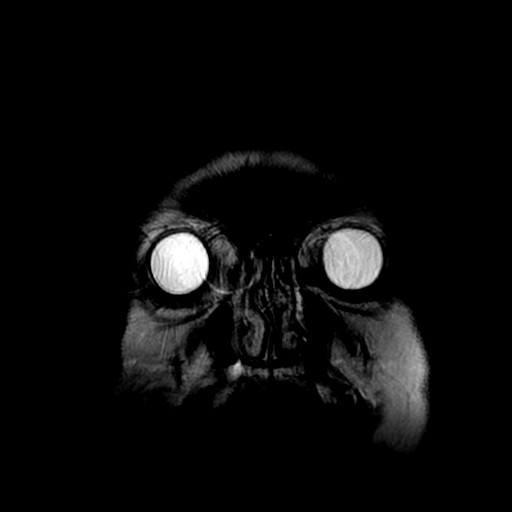
[im 14/28]
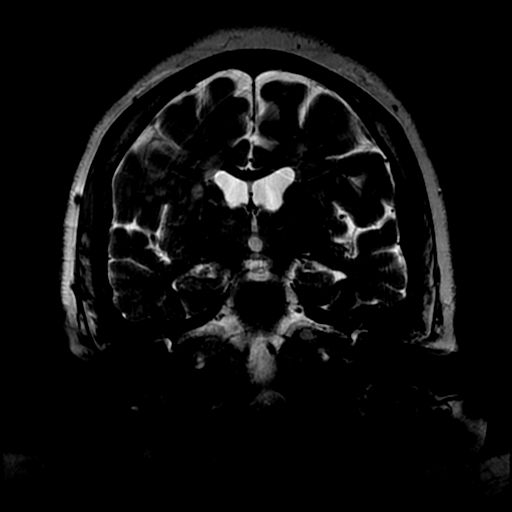
[im 28/28]
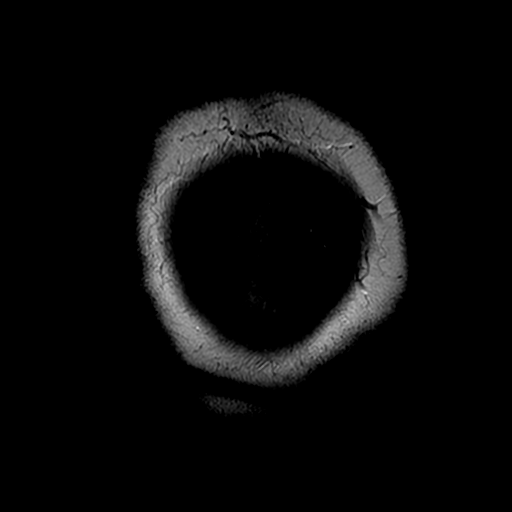

[Series 350: ADC · axial · 3.0mm · 0.94mm/px · z∈[-104,+41]mm · 5 of 49 slices shown (1 of 2)]
[im 1/49]
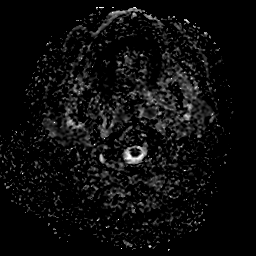
[im 13/49]
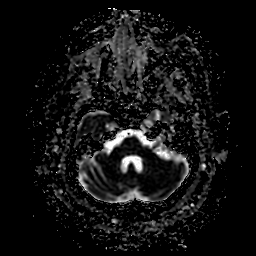
[im 25/49]
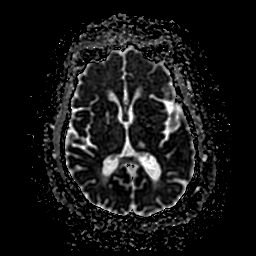
[im 37/49]
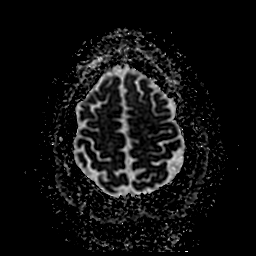
[im 49/49]
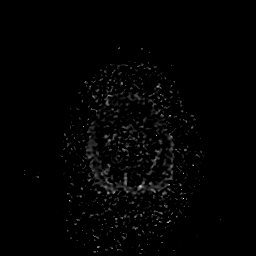

[Series 450: ADC · coronal · 4.0mm · 0.94mm/px · 3 of 36 slices shown (2 of 2)]
[im 1/36]
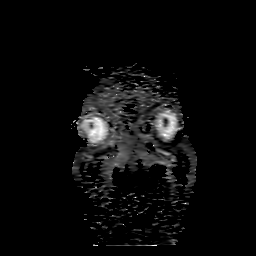
[im 18/36]
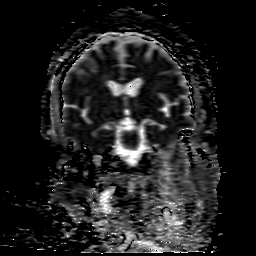
[im 36/36]
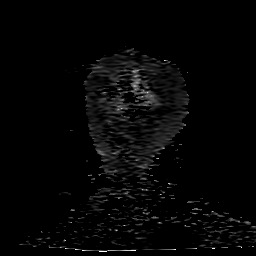

[35 of 48 positions shown; findings below may reference images not displayed]

FINDINGS: Brain: No acute infarction, hemorrhage, hydrocephalus, extra-axial
collection or mass lesion. Late subacute 5 mm infarct in the right
mid corona radiata is stable. Several patchy nonspecific foci of T2
FLAIR hyperintense signal abnormality in subcortical and
periventricular white matter are compatible with moderate chronic
microvascular ischemic changes for age. Moderate brain parenchymal
volume loss. Very small chronic infarctions are present in the right
cerebellar hemisphere and mid pons. Chronic lacunar infarcts within
left anterior limb of internal capsule and thalamus. Punctate foci
of susceptibility hypointensity are present within the bilateral
thalami compatible hemosiderin deposition of chronic
microhemorrhage.

Vascular: Normal flow voids.

Skull and upper cervical spine: Normal marrow signal.

Sinuses/Orbits: Bilateral maxillary sinus mucous retention cysts and
mild paranasal sinus mucosal thickening. No significant abnormal
signal of mastoid air cells. Orbits are unremarkable.

Other: None.
IMPRESSION: 1. No acute intracranial abnormality identified.
2. Late subacute 5 mm infarct in right mid corona radiata is stable.
3. Moderate chronic microvascular ischemic changes and parenchymal
volume loss of the brain.
4. Small chronic infarcts in right cerebellum, pons, and left basal
ganglia are stable.

By: Belenus Cheda M.D.

## 2019-05-10 ENCOUNTER — Encounter: Payer: Self-pay | Admitting: Family Medicine

## 2019-05-10 ENCOUNTER — Ambulatory Visit (INDEPENDENT_AMBULATORY_CARE_PROVIDER_SITE_OTHER): Payer: Medicare HMO | Admitting: Family Medicine

## 2019-05-10 ENCOUNTER — Other Ambulatory Visit: Payer: Self-pay

## 2019-05-10 VITALS — BP 150/86 | HR 109 | Temp 98.5°F | Ht 67.0 in | Wt 255.0 lb

## 2019-05-10 DIAGNOSIS — G8929 Other chronic pain: Secondary | ICD-10-CM

## 2019-05-10 DIAGNOSIS — K5900 Constipation, unspecified: Secondary | ICD-10-CM | POA: Diagnosis not present

## 2019-05-10 DIAGNOSIS — R1032 Left lower quadrant pain: Secondary | ICD-10-CM | POA: Diagnosis not present

## 2019-05-10 DIAGNOSIS — M545 Low back pain: Secondary | ICD-10-CM | POA: Diagnosis not present

## 2019-05-10 MED ORDER — POLYETHYLENE GLYCOL 3350 17 GM/SCOOP PO POWD: 17.0000 g | Freq: Every day | ORAL | 1 refills | Status: DC

## 2019-05-10 MED ORDER — GLUCOSE BLOOD VI STRP: ORAL_STRIP | 12 refills | Status: DC

## 2019-05-10 MED ORDER — DICYCLOMINE HCL 20 MG PO TABS: 20.0000 mg | ORAL_TABLET | Freq: Two times a day (BID) | ORAL | 0 refills | Status: DC

## 2019-05-10 MED ORDER — CYCLOBENZAPRINE HCL 10 MG PO TABS: 10.0000 mg | ORAL_TABLET | Freq: Three times a day (TID) | ORAL | 0 refills | Status: DC | PRN

## 2019-05-10 NOTE — Patient Instructions (Signed)
° ° ° °  If you have lab work done today you will be contacted with your lab results within the next 2 weeks.  If you have not heard from us then please contact us. The fastest way to get your results is to register for My Chart. ° ° °IF you received an x-ray today, you will receive an invoice from [facility]. Please contact [facility] at [phone] with questions or concerns regarding your invoice.  ° °IF you received labwork today, you will receive an invoice from [facility]. Please contact [facility] at [phone] with questions or concerns regarding your invoice.  ° °Our billing staff will not be able to assist you with questions regarding bills from these companies. ° °You will be contacted with the lab results as soon as they are available. The fastest way to get your results is to activate your My Chart account. Instructions are located on the last page of this paperwork. If you have not heard from us regarding the results in 2 weeks, please contact this office. °  ° ° ° °

## 2019-05-10 NOTE — Progress Notes (Signed)
6/19/202011:27 AM  Arta Bruce [birth year], 68 y.o., female [id number]  Chief Complaint  Patient presents with  . Abdominal Pain    went to er was dx with diverticulitis, not able to produce much stool. She is needing the dietary guidelines for this condition. Has been eating seeds and other food that are irritable causing lots of pain  . Diabetes    only wants to deal with the pain she is having in her stomach today  . Medication Refill    dicyclomine, finished both antibiotics, wants to know will she need refill    HPI:   Patient is a 68 y.o. female with past medical history significant for HTN, DM2, HLP, PAD w hx L fem-pop bypass, CVA, dCHF, smoker who presents today for ER followup  Seen in ER April 22 2019  Dx with diverticulitis Normal CBC CT abd/pelvis - normal appendix, no diverticulitis, no kidney stones, + moderate size umbilical hernia, fat containing, normal uterus and adnexa Dx cipro and flagyl, dicyclomine  Patient states that her abd pain is better but not resolved LLQ pain, radiates to the back Present for months, stabbing, achy, at first intermittent but then became constant and that is when she decided to go to the ER Medication given in ER helps, also takes APAP XR Constipated for past 3-4 days, however previously normal This morning did a warm and vaseline enema, did not help Very gassy, nausea Denies any vomiting, rectal pain, fever or chills Denies any black tarry stools or blood in stools Has not had colonscopy   Depression screen [facility] 2/9 05/10/2019 05/10/2019 02/07/2019  Decreased Interest 0 0 0  Down, Depressed, Hopeless 0 0 0  PHQ - 2 Score 0 0 0    Fall Risk  05/10/2019 05/10/2019 02/07/2019 11/12/2018 10/09/2018  Falls in the past year? 0 0 0 0 0  Number falls in past yr: 0 0 0 - -  Injury with Fall? 0 0 0 - -     Allergies  Allergen Reactions  . Atorvastatin Swelling    Lip swelling  . Nifedipine Er Other (See Comments)    Caused  nose bleeds in higher doses (90 mg., namely)  . Latex Rash    Prior to Admission medications   Medication Sig Start Date End Date Taking? Authorizing Provider  acetaminophen (TYLENOL) 500 MG tablet Take 1,000 mg by mouth every 6 (six) hours as needed for moderate pain or headache.    Yes [provider]  aspirin 81 MG EC tablet Take 1 tablet (81 mg total) by mouth daily. 04/06/18  Yes Rutherford Guys, MD  atorvastatin (LIPITOR) 80 MG tablet Take 1 tablet (80 mg total) by mouth daily at 6 PM. 02/07/19  Yes Rutherford Guys, MD  clopidogrel (PLAVIX) 75 MG tablet Take 1 tablet (75 mg total) by mouth daily. 12/17/18 12/17/19 Yes Waynetta Sandy, MD  dicyclomine (BENTYL) 20 MG tablet Take 1 tablet (20 mg total) by mouth 2 (two) times daily. 04/22/19  Yes Lonzo Candy, MD  enalapril (VASOTEC) 20 MG tablet Take 1 tablet (20 mg total) by mouth 2 (two) times daily. 02/07/19  Yes Rutherford Guys, MD  fluticasone (FLONASE) 50 MCG/ACT nasal spray Place 1 spray into both nostrils 2 (two) times daily. Patient taking differently: Place 1 spray into both nostrils 2 (two) times daily as needed for allergies or rhinitis.  06/21/18  Yes Rutherford Guys, MD  metFORMIN (GLUCOPHAGE) 1000 MG tablet Take 1 tablet (1,000  mg total) by mouth 2 (two) times daily with a meal. 02/07/19  Yes Rutherford Guys, MD  metoprolol succinate (TOPROL-XL) 50 MG 24 hr tablet Take 1 tablet (50 mg total) by mouth daily. 02/07/19  Yes Rutherford Guys, MD  NIFEdipine (ADALAT CC) 60 MG 24 hr tablet Take 1 tablet (60 mg total) by mouth daily. 02/07/19  Yes Rutherford Guys, MD  sitaGLIPtin (JANUVIA) 50 MG tablet Take 1 tablet (50 mg total) by mouth daily. 02/07/19  Yes Rutherford Guys, MD  spironolactone (ALDACTONE) 25 MG tablet Take 1 tablet (25 mg total) by mouth daily. 02/07/19  Yes Rutherford Guys, MD  glucose blood test strip Use as instructed 05/10/19   Rutherford Guys, MD    Past Medical History:  Diagnosis Date  .  Arthritis   . Chronic headaches   . Constipation   . Depression   . Diabetes mellitus   . Hyperlipidemia   . Hypertension   . Obesity   . PAD (peripheral artery disease) ([place])   . Shortness of breath   . Stroke [facility])     Past Surgical History:  Procedure Laterality Date  . ABDOMINAL AORTOGRAM W/LOWER EXTREMITY N/A 12/17/2018   Procedure: ABDOMINAL AORTOGRAM W/LOWER EXTREMITY;  Surgeon: Waynetta Sandy, MD;  Location: [place] [facility];  Service: Cardiovascular;  Laterality: N/A;  . FEMORAL BYPASS  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 Bridgett Larsson  . PERIPHERAL VASCULAR ATHERECTOMY Left 12/17/2018   Procedure: PERIPHERAL VASCULAR ATHERECTOMY;  Surgeon: Waynetta Sandy, MD;  Location: [place] [facility];  Service: Cardiovascular;  Laterality: Left;  . PERIPHERAL VASCULAR BALLOON ANGIOPLASTY Left 12/17/2018   Procedure: PERIPHERAL VASCULAR BALLOON ANGIOPLASTY;  Surgeon: Waynetta Sandy, MD;  Location: [place] [facility];  Service: Cardiovascular;  Laterality: Left;    Social History   Tobacco Use  . Smoking status: Light Tobacco Smoker    Packs/day: 0.50    Years: 35.00    Pack years: 17.50    Types: Cigarettes  . Smokeless tobacco: Never Used  . Tobacco comment: patient is trying to quit and has gone down to half a pack per day  Substance Use Topics  . Alcohol use: Yes    Alcohol/week: 1.0 standard drinks    Types: 1 Standard drinks or equivalent per week    Comment: occasional use only    Family History  Problem Relation Age of Onset  . Diabetes Father   . Heart disease Mother        NOT before age 24-  Bypass  . Hypertension Mother   . Hypertension Sister   . Varicose Veins Brother   . Heart disease Brother        Before age 46  . Hypertension Daughter     ROS Per hpi  OBJECTIVE:  Today's Vitals   05/10/19 1123  BP: (!) 150/86  Pulse: (!) 109  Temp: 98.5 F (36.9 C)  TempSrc: Oral  SpO2: 97%   Weight: 255 lb (115.7 kg)  Height: 5\' 7"  (1.702 m)   Body mass index is 39.94 kg/m.   Physical Exam Vitals signs and nursing note reviewed.  Constitutional:      Appearance: She is well-developed.  HENT:     Head: Normocephalic and atraumatic.     Mouth/Throat:     Pharynx: No oropharyngeal exudate.  Eyes:     General: No scleral icterus.    Conjunctiva/sclera: Conjunctivae normal.  Pupils: Pupils are equal, round, and reactive to light.  Neck:     Musculoskeletal: Neck supple.  Cardiovascular:     Rate and Rhythm: Normal rate and regular rhythm.     Heart sounds: Normal heart sounds. No murmur. No friction rub. No gallop.   Pulmonary:     Effort: Pulmonary effort is normal.     Breath sounds: Normal breath sounds. No wheezing or rales.  Abdominal:     General: Bowel sounds are normal.     Palpations: Abdomen is soft. There is no hepatomegaly, splenomegaly or mass.     Tenderness: There is abdominal tenderness in the left lower quadrant. There is no guarding or rebound.     Hernia: A hernia is present. Hernia is present in the umbilical area.  Musculoskeletal:     Lumbar back: She exhibits tenderness and spasm. She exhibits no bony tenderness.  Skin:    General: Skin is warm and dry.  Neurological:     Mental Status: She is alert and oriented to person, place, and time.     ASSESSMENT and PLAN  1. LLQ abdominal pain CBC and CT scan no acute process, refilled bentyl, referring to GI as never had colon cancer screening - Ambulatory referral to Gastroenterology  2. Constipation, unspecified constipation type Discussed miralax  3. Chronic bilateral low back pain without sciatica Cont APAP, adding flexeril, reviewed r/se/b Avoid NSAIDs given crt and HTN  Other orders - glucose blood test strip; Use as instructed - dicyclomine (BENTYL) 20 MG tablet; Take 1 tablet (20 mg total) by mouth 2 (two) times daily. - cyclobenzaprine (FLEXERIL) 10 MG tablet; Take 1 tablet  (10 mg total) by mouth 3 (three) times daily as needed for muscle spasms. - polyethylene glycol powder (GLYCOLAX/MIRALAX) 17 GM/SCOOP powder; Take 17 g by mouth daily.  Return in about 4 weeks (around 06/07/2019) for routine care.    Rutherford Guys, MD [facility] [place], [place] [address] Ph.  [phone] Fax [phone]

## 2019-05-15 NOTE — Progress Notes (Signed)
TELEHEALTH VISIT  Referring Provider: Rutherford Guys, MD Primary Care Physician:  Rutherford Guys, MD   Tele-visit due to COVID-19 pandemic Patient requested visit virtually, consented to the virtual encounter via video enabled telemedicine application (Unsuccessful attempt with video because the patient does not have a computer or smart phone, visit converted to [place] audio) Contact made at: 09:00 05/16/19 Patient verified by name and date of birth Location of patient: Home Location provider: [facility] Names of persons participating: Me, patient, Tinnie Gens CMA Time spent on telehealth visit: 24 minutes I discussed the limitations of evaluation and management by telemedicine. The patient expressed understanding and agreed to proceed.  Reason for Consultation:  LLQ pain   IMPRESSION:  LLQ abdominal pain with associated gas and nausea and recent change in bowel habits    - no source on non-contrasted CT 05/10/19    - normal LFTs, lipase, CBC 05/10/19    - empirically treated for diverticulitis (Cipro, Flagyl) without significant change in symptoms    - improved with defecation Recent constipation, improving with Miralax Diverticulosis without diverticulitis on CT 04/22/19  BMI 39.63 No prior colon cancer screening  No known family history of colon cancer or polyps  Etiology of LLQ abdominal pain is unclear. Empiric treatment for diverticulitis provided minimal symptom improvement.  Unfortunately, renal insufficiency appears to limit evaluation with contrasted cross imaging.   I am recommending a colonoscopy and EGD for further evaluation. Continue Miralax, as improving her constipation seems to be the best relief that she finds of her abdominal pain.   Will need to hold Plavix for 5 days before endoscopy.  I discussed with the patient that there is a low, but real, risk of a cardiovascular event such as heart attack, stroke, or embolism/thrombosis while off  Plavix. Will communicate by phone or EMR with patient's prescribing provider to confirm that holding the Plavix is appropriate at this time.   PLAN: Continue dicyclomine Continue Miralax Avoid NSAIDs EGD and colonoscopy after a Plavix washout  I consented the patient discussing the risks, benefits, and alternatives to endoscopic evaluation. In particular, we discussed the risks that include, but are not limited to, reaction to medication, cardiopulmonary compromise, bleeding requiring blood transfusion, aspiration resulting in pneumonia, perforation requiring surgery, lack of diagnosis, severe illness requiring hospitalization, and even death. We reviewed the risk of missed lesion including polyps or even cancer. The patient acknowledges these risks and asks that we proceed.   HPI: Alicia [name] is a 68 y.o. female referred by Dr. Pamella Pert for further evaluation of LLQ pain. The history is obtained through the patient and review of her electronic health record. She has HTN, diabetes, hyperlipidemia, PAD with a history of left fem-pop bypass, CVA, dCHF, and she smokes.   Seen in ER April 22 2019 for several months of severe, stabbing, left sided abdominal pain.  Labs 04/22/19: crt 1.25, BUN 14, AST 10, ALT 9, alk phos 110, TB 0.4, alb 3.2 CT abd/pelvis without contrast showed a moderate size umbilical hernia, left-sided diverticulosis.  Told she had diverticulitis and was treated with Cipro and Flagyl, Dicyclomine.   She is now having near constant, left sided abdominal pain that radiates to the back that is not as severe as when she went to the ED. Associated eructation, flatus, and bloating. Occasional nausea. Recent constipation. Pain worsens the longer she goes between bowel movements. Pain relieved by defecation. Pain exacerbated by movement. No change with eating. No sitophobia.  No diarrhea,  melena, hematochezia, or blood or mucous in the stool. Temporary relieved with APAP XR. Dr.  Pamella Pert started Miralax a few days ago and she thinks this is providing some additional relief. No other associated symptoms. No identified exacerbating or relieving features.    She is concerned that she has appendicitis.    Was taking Naproxen until last year. No other NSAIDs.   No prior colonoscopy. She reports a stool test in the mail last year through her insurance company. She believes the study was negative.   No known family history of colon cancer or polyps. No family history of uterine/endometrial cancer, pancreatic cancer or gastric/stomach cancer.   Past Medical History:  Diagnosis Date  . Arthritis   . Chronic headaches   . Constipation   . Depression   . Diabetes mellitus   . Hyperlipidemia   . Hypertension   . Obesity   . PAD (peripheral artery disease) ([place])   . Shortness of breath   . Stroke [facility])     Past Surgical History:  Procedure Laterality Date  . ABDOMINAL AORTOGRAM W/LOWER EXTREMITY N/A 12/17/2018   Procedure: ABDOMINAL AORTOGRAM W/LOWER EXTREMITY;  Surgeon: Waynetta Sandy, MD;  Location: [place];  Service: Cardiovascular;  Laterality: N/A;  . FEMORAL BYPASS  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 Bridgett Larsson  . PERIPHERAL VASCULAR ATHERECTOMY Left 12/17/2018   Procedure: PERIPHERAL VASCULAR ATHERECTOMY;  Surgeon: Waynetta Sandy, MD;  Location: [place];  Service: Cardiovascular;  Laterality: Left;  . PERIPHERAL VASCULAR BALLOON ANGIOPLASTY Left 12/17/2018   Procedure: PERIPHERAL VASCULAR BALLOON ANGIOPLASTY;  Surgeon: Waynetta Sandy, MD;  Location: [facility];  Service: Cardiovascular;  Laterality: Left;    Current Outpatient Medications  Medication Sig Dispense Refill  . acetaminophen (TYLENOL) 500 MG tablet Take 1,000 mg by mouth every 6 (six) hours as needed for moderate pain or headache.     Marland Kitchen aspirin 81 MG EC tablet Take 1 tablet (81 mg total) by mouth daily. 30  tablet 0  . atorvastatin (LIPITOR) 80 MG tablet Take 1 tablet (80 mg total) by mouth daily at 6 PM. 90 tablet 3  . clopidogrel (PLAVIX) 75 MG tablet Take 1 tablet (75 mg total) by mouth daily. 30 tablet 3  . cyclobenzaprine (FLEXERIL) 10 MG tablet Take 1 tablet (10 mg total) by mouth 3 (three) times daily as needed for muscle spasms. 30 tablet 0  . dicyclomine (BENTYL) 20 MG tablet Take 1 tablet (20 mg total) by mouth 2 (two) times daily. 20 tablet 0  . enalapril (VASOTEC) 20 MG tablet Take 1 tablet (20 mg total) by mouth 2 (two) times daily. 180 tablet 1  . fluticasone (FLONASE) 50 MCG/ACT nasal spray Place 1 spray into both nostrils 2 (two) times daily. (Patient taking differently: Place 1 spray into both nostrils 2 (two) times daily as needed for allergies or rhinitis. ) 16 g 6  . glucose blood test strip Use as instructed 100 each 12  . metFORMIN (GLUCOPHAGE) 1000 MG tablet Take 1 tablet (1,000 mg total) by mouth 2 (two) times daily with a meal. 180 tablet 1  . metoprolol succinate (TOPROL-XL) 50 MG 24 hr tablet Take 1 tablet (50 mg total) by mouth daily. 90 tablet 1  . NIFEdipine (ADALAT CC) 60 MG 24 hr tablet Take 1 tablet (60 mg total) by mouth daily. 90 tablet 1  . polyethylene glycol powder (GLYCOLAX/MIRALAX) 17 GM/SCOOP powder Take  17 g by mouth daily. 255 g 1  . sitaGLIPtin (JANUVIA) 50 MG tablet Take 1 tablet (50 mg total) by mouth daily. 90 tablet 1  . spironolactone (ALDACTONE) 25 MG tablet Take 1 tablet (25 mg total) by mouth daily. 90 tablet 1   No current facility-administered medications for this visit.     Allergies as of 05/16/2019 - Review Complete 05/10/2019  Allergen Reaction Noted  . Atorvastatin Swelling 04/22/2019  . Nifedipine er Other (See Comments) 04/03/2018  . Latex Rash 04/03/2018    Family History  Problem Relation Age of Onset  . Diabetes Father   . Heart disease Mother        NOT before age 15-  Bypass  . Hypertension Mother   . Hypertension Sister    . Varicose Veins Brother   . Heart disease Brother        Before age 55  . Hypertension Daughter     Social History   Socioeconomic History  . Marital status: Single    Spouse name: Not on file  . Number of children: Not on file  . Years of education: Not on file  . Highest education level: Not on file  Occupational History  . Not on file  Social Needs  . Financial resource strain: Not on file  . Food insecurity    Worry: Not on file    Inability: Not on file  . Transportation needs    Medical: Not on file    Non-medical: Not on file  Tobacco Use  . Smoking status: Light Tobacco Smoker    Packs/day: 0.50    Years: 35.00    Pack years: 17.50    Types: Cigarettes  . Smokeless tobacco: Never Used  . Tobacco comment: patient is trying to quit and has gone down to half a pack per day  Substance and Sexual Activity  . Alcohol use: Yes    Alcohol/week: 1.0 standard drinks    Types: 1 Standard drinks or equivalent per week    Comment: occasional use only  . Drug use: No  . Sexual activity: Not Currently  Lifestyle  . Physical activity    Days per week: Not on file    Minutes per session: Not on file  . Stress: Not on file  Relationships  . Social Herbalist on phone: Not on file    Gets together: Not on file    Attends religious service: Not on file    Active member of club or organization: Not on file    Attends meetings of clubs or organizations: Not on file    Relationship status: Not on file  . Intimate partner violence    Fear of current or ex partner: Not on file    Emotionally abused: Not on file    Physically abused: Not on file    Forced sexual activity: Not on file  Other Topics Concern  . Not on file  Social History Narrative  . Not on file    Review of Systems: ALL ROS discussed and all others negative except listed in HPI.  Physical Exam: Complete physical exam not performed due to the limits inherent in a telehealth encounter.   General: Awake, alert, and oriented, and well communicative. In no acute distress.  HEENT: EOMI, non-icteric sclera, NCAT, MMM  Neck: Normal movement of head and neck  Pulm: No labored breathing, speaking in full sentences without conversational dyspnea  Derm: No apparent lesions or bruising in  visible field  MS: Moves all visible extremities without noticeable abnormality  Psych: Pleasant, cooperative, normal speech, normal affect and normal insight Neuro: Alert and appropriate   Berdella Bacot L. Tarri Glenn, MD, MPH [facility] 05/15/2019, 6:05 PM

## 2019-05-16 ENCOUNTER — Ambulatory Visit (INDEPENDENT_AMBULATORY_CARE_PROVIDER_SITE_OTHER): Payer: Medicare HMO | Admitting: Gastroenterology

## 2019-05-16 ENCOUNTER — Encounter: Payer: Self-pay | Admitting: Gastroenterology

## 2019-05-16 VITALS — Ht 67.0 in | Wt 253.0 lb

## 2019-05-16 DIAGNOSIS — R110 Nausea: Secondary | ICD-10-CM

## 2019-05-16 DIAGNOSIS — R1032 Left lower quadrant pain: Secondary | ICD-10-CM

## 2019-05-16 DIAGNOSIS — Z7902 Long term (current) use of antithrombotics/antiplatelets: Secondary | ICD-10-CM | POA: Diagnosis not present

## 2019-05-16 DIAGNOSIS — R140 Abdominal distension (gaseous): Secondary | ICD-10-CM

## 2019-05-20 ENCOUNTER — Telehealth: Payer: Self-pay | Admitting: Gastroenterology

## 2019-05-20 NOTE — Telephone Encounter (Signed)
Spoke to patient, patient notified this RN that she has been off of her Plavix for 3-4 weeks now, she only had a prescription for 3 months. Per Dr. Tarri Glenn 6/25, she could be scheduled for her EGD/COLON.  Scheduled with patient:  Tues 06/11/2019 at 1:30 pm TELEPHONE pre-visit with RN  Tues 06/25/2019 at 2:00 pm EGD/COLON in [facility]

## 2019-05-20 NOTE — Telephone Encounter (Signed)
Pt called inquiring if she can schedule her procedure, she was taking plavix and Dr. Tarri Glenn wanted to wait, pt states that she stopped taking it about three weeks.

## 2019-06-05 ENCOUNTER — Telehealth: Payer: Self-pay | Admitting: *Deleted

## 2019-06-05 NOTE — Telephone Encounter (Signed)
Call placed to pt.to obtain clarification of plavix ,pt.stated " I only had to take the plavix for 3 months and they took me off of it in May,I only had to take it for a procedure that I had on my leg".

## 2019-06-07 ENCOUNTER — Ambulatory Visit: Payer: Medicare HMO | Admitting: Family Medicine

## 2019-06-11 ENCOUNTER — Ambulatory Visit (AMBULATORY_SURGERY_CENTER): Payer: Self-pay

## 2019-06-11 ENCOUNTER — Other Ambulatory Visit: Payer: Self-pay

## 2019-06-11 VITALS — Ht 67.0 in | Wt 253.0 lb

## 2019-06-11 DIAGNOSIS — R1032 Left lower quadrant pain: Secondary | ICD-10-CM

## 2019-06-11 MED ORDER — NA SULFATE-K SULFATE-MG SULF 17.5-3.13-1.6 GM/177ML PO SOLN: 1.0000 | Freq: Once | ORAL | 0 refills | Status: AC

## 2019-06-11 NOTE — Progress Notes (Signed)
Denies allergies to eggs or soy products. Denies complication of anesthesia or sedation. Denies use of weight loss medication. Denies use of O2.   Emmi instructions given for colonoscopy.  Pre-Visit was conducted by phone due to Covid 19. Instructions were reviewed and mailed to patients confirmed home address. Patient was encouraged to call if she had any questions regarding instructions.

## 2019-06-20 ENCOUNTER — Encounter: Payer: Medicare HMO | Admitting: Gastroenterology

## 2019-06-24 ENCOUNTER — Telehealth: Payer: Self-pay | Admitting: Gastroenterology

## 2019-06-24 NOTE — Telephone Encounter (Signed)
No answer or vmail to leave message regarding Covid-19 screening questions. Covid-19 Screening Questions:  Do you now or have you had a fever in the last 14 days?   Do you have any respiratory symptoms of shortness of breath or cough now or in the last 14 days?   Do you have any family members or close contacts with diagnosed or suspected Covid-19 in the past 14 days?   Have you been tested for Covid-19 and found to be positive?

## 2019-06-25 ENCOUNTER — Other Ambulatory Visit: Payer: Self-pay

## 2019-06-25 ENCOUNTER — Ambulatory Visit (AMBULATORY_SURGERY_CENTER): Payer: Medicare HMO | Admitting: Gastroenterology

## 2019-06-25 ENCOUNTER — Encounter: Payer: Self-pay | Admitting: Gastroenterology

## 2019-06-25 VITALS — BP 186/94 | HR 81 | Temp 95.7°F | Resp 24 | Ht 67.0 in | Wt 253.0 lb

## 2019-06-25 DIAGNOSIS — D128 Benign neoplasm of rectum: Secondary | ICD-10-CM

## 2019-06-25 DIAGNOSIS — R1032 Left lower quadrant pain: Secondary | ICD-10-CM

## 2019-06-25 DIAGNOSIS — K3189 Other diseases of stomach and duodenum: Secondary | ICD-10-CM | POA: Diagnosis not present

## 2019-06-25 DIAGNOSIS — K5730 Diverticulosis of large intestine without perforation or abscess without bleeding: Secondary | ICD-10-CM

## 2019-06-25 DIAGNOSIS — K449 Diaphragmatic hernia without obstruction or gangrene: Secondary | ICD-10-CM | POA: Diagnosis not present

## 2019-06-25 DIAGNOSIS — D122 Benign neoplasm of ascending colon: Secondary | ICD-10-CM

## 2019-06-25 DIAGNOSIS — K2970 Gastritis, unspecified, without bleeding: Secondary | ICD-10-CM

## 2019-06-25 DIAGNOSIS — K219 Gastro-esophageal reflux disease without esophagitis: Secondary | ICD-10-CM | POA: Diagnosis not present

## 2019-06-25 DIAGNOSIS — I10 Essential (primary) hypertension: Secondary | ICD-10-CM | POA: Diagnosis not present

## 2019-06-25 DIAGNOSIS — K621 Rectal polyp: Secondary | ICD-10-CM

## 2019-06-25 DIAGNOSIS — K5289 Other specified noninfective gastroenteritis and colitis: Secondary | ICD-10-CM | POA: Diagnosis not present

## 2019-06-25 DIAGNOSIS — D120 Benign neoplasm of cecum: Secondary | ICD-10-CM

## 2019-06-25 DIAGNOSIS — E119 Type 2 diabetes mellitus without complications: Secondary | ICD-10-CM | POA: Diagnosis not present

## 2019-06-25 DIAGNOSIS — I739 Peripheral vascular disease, unspecified: Secondary | ICD-10-CM | POA: Diagnosis not present

## 2019-06-25 DIAGNOSIS — R110 Nausea: Secondary | ICD-10-CM

## 2019-06-25 MED ORDER — SODIUM CHLORIDE 0.9 % IV SOLN: 500.0000 mL | Freq: Once | INTRAVENOUS | Status: DC

## 2019-06-25 NOTE — Progress Notes (Signed)
Called to room to assist during endoscopic procedure.  Patient ID and intended procedure confirmed with present staff. Received instructions for my participation in the procedure from the performing physician.  

## 2019-06-25 NOTE — Op Note (Addendum)
[facility] Patient Name: Alicia [name] Procedure Date: 06/25/2019 1:38 PM MRN: [id number] Endoscopist: Thornton Park MD, MD Age: 68 Referring MD:  Date of Birth: [birth year] Gender: Female Account #: [id number] Procedure:                Colonoscopy Indications:              LLQ abdominal pain with associated gas and nausea                            and recent change in bowel habits                           - no source on non-contrasted CT 05/10/19                           - normal LFTs, lipase, CBC 05/10/19                           - empirically treated for diverticulitis (Cipro,                            Flagyl) without significant change in symptoms                           - improved with defecation                           Recent constipation, improving with Miralax                           Diverticulosis without diverticulitis on CT 04/22/19                           No prior colon cancer screening                           No known family history of colon cancer or polyps Medicines:                See the Anesthesia note for documentation of the                            administered medications Procedure:                Pre-Anesthesia Assessment:                           - Prior to the procedure, a History and Physical                            was performed, and patient medications and                            allergies were reviewed. The patient's tolerance of                            previous anesthesia was also reviewed. The  risks                            and benefits of the procedure and the sedation                            options and risks were discussed with the patient.                            All questions were answered, and informed consent                            was obtained. Prior Anticoagulants: Plavix was                            discontinued in May 2020. ASA Grade Assessment: II                            - A patient  with mild systemic disease. After                            reviewing the risks and benefits, the patient was                            deemed in satisfactory condition to undergo the                            procedure.                           After obtaining informed consent, the colonoscope                            was passed under direct vision. Throughout the                            procedure, the patient's blood pressure, pulse, and                            oxygen saturations were monitored continuously. The                            Colonoscope was introduced through the anus and                            advanced to the the terminal ileum, with                            identification of the appendiceal orifice and IC                            valve. A second forward view of the right colon was  performed. The colonoscopy was performed with                            moderate difficulty due to a redundant colon,                            significant looping and a tortuous colon.                            Successful completion of the procedure was aided by                            applying abdominal pressure. The patient tolerated                            the procedure well. The quality of the bowel                            preparation was good. The terminal ileum, ileocecal                            valve, appendiceal orifice, and rectum were                            photographed. Scope In: 2:11:02 PM Scope Out: 2:30:42 PM Scope Withdrawal Time: 0 hours 8 minutes 5 seconds  Total Procedure Duration: 0 hours 19 minutes 40 seconds  Findings:                 The perianal and digital rectal examinations were                            normal.                           Multiple small and large-mouthed diverticula were                            found in the entire colon. The diverticulosis was                            most dense  in the sigmoid and descending colon.                            There are scattered diverticula in the transverse                            colon and right colon.                           A 1 mm polyp was found in the ascending colon. The                            polyp was sessile. The polyp was removed with a  cold biopsy forceps. Resection and retrieval were                            complete. Estimated blood loss was minimal.                           A 3 mm polyp was found in the rectum. The polyp was                            sessile. The polyp was removed with a cold snare.                            Resection and retrieval were complete. Estimated                            blood loss was minimal.                           The exam was otherwise without abnormality on                            direct and retroflexion views. Complications:            No immediate complications. Estimated blood loss:                            Minimal. Estimated Blood Loss:     Estimated blood loss was minimal. Impression:               - Diverticulosis in the entire examined colon.                           - One 1 mm polyp in the ascending colon, removed                            with a cold biopsy forceps. Resected and retrieved.                           - One 3 mm polyp in the rectum, removed with a cold                            snare. Resected and retrieved.                           - The examination was otherwise normal on direct                            and retroflexion views. Recommendation:           - Patient has a contact number available for                            emergencies. The signs and symptoms of potential  delayed complications were discussed with the                            patient. Return to normal activities tomorrow.                            Written discharge instructions were provided to the                             patient.                           - Resume regular diet today. High fiber diet                            recommended.                           - Continue present medications.                           - Await pathology results.                           - Repeat colonoscopy date to be determined after                            pending pathology results are reviewed for                            surveillance. Plan colonoscopy in 7 years if at                            least one polyp is an adenoma. Thornton Park MD, MD 06/25/2019 2:43:24 PM This report has been signed electronically.

## 2019-06-25 NOTE — Op Note (Addendum)
[facility]lic[name] Patient Name: Alicia [name] Procedure Date: 06/25/2019 1:38 PM MRN: [id number] Endoscopist: Thornton Park MD, MD Age: 68 Referring MD:  Date of Birth: 1951/11/02 Gender: Female Account #: [id number] Procedure:                Upper GI endoscopy Indications:              LLQ abdominal pain with associated gas and nausea                            and recent change in bowel habits                           - no source on non-contrasted CT 05/10/19                           - normal LFTs, lipase, CBC 05/10/19                           - empirically treated for diverticulitis (Cipro,                            Flagyl) without significant change in symptoms Medicines:                See the Anesthesia note for documentation of the                            administered medications Procedure:                Pre-Anesthesia Assessment:                           - Prior to the procedure, a History and Physical                            was performed, and patient medications and                            allergies were reviewed. The patient's tolerance of                            previous anesthesia was also reviewed. The risks                            and benefits of the procedure and the sedation                            options and risks were discussed with the patient.                            All questions were answered, and informed consent                            was obtained. Prior Anticoagulants: She  discontinued Plavix in April 2020. ASA Grade                            Assessment: II - A patient with mild systemic                            disease. After reviewing the risks and benefits,                            the patient was deemed in satisfactory condition to                            undergo the procedure.                           After obtaining informed consent, the endoscope was                             passed under direct vision. Throughout the                            procedure, the patient's blood pressure, pulse, and                            oxygen saturations were monitored continuously. The                            Endoscope was introduced through the mouth, and                            advanced to the third part of duodenum. The upper                            GI endoscopy was accomplished without difficulty.                            The patient tolerated the procedure well. Scope In: Scope Out: Findings:                 The Z-line was slightly irregular. Biopsies were                            taken with a cold forceps for histology. Estimated                            blood loss was minimal.                           Diffuse mildly erythematous mucosa without bleeding                            was found in the gastric body and in the gastric  antrum. Biopsies were taken with a cold forceps for                            histology. Estimated blood loss was minimal.                           A small hiatal hernia was present.                           Multiple small sessile polyps with no bleeding were                            found in the gastric fundus and in the gastric                            body. These are likely fundic gland polyps.                            Biopsies were taken with a cold forceps for                            histology. Estimated blood loss was minimal.                           The examined duodenum was normal. Biopsies were                            taken with a cold forceps for histology. Estimated                            blood loss was minimal.                           The cardia and gastric fundus were normal on                            retroflexion. Complications:            No immediate complications. Estimated blood loss:                            Minimal. Estimated Blood Loss:     Estimated  blood loss was minimal. Impression:               - Z-line irregular. Biopsied.                           - Erythematous mucosa in the gastric body and                            antrum. Biopsied.                           - Small hiatal hernia.                           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 Biopsied.                           - Normal examined duodenum. Biopsied. Recommendation:           - Patient has a contact number available for                            emergencies. The signs and symptoms of potential                            delayed complications were discussed with the                            patient. Return to normal activities tomorrow.                            Written discharge instructions were provided to the                            patient.                           - Resume regular diet today.                           - Continue present medications.                           - Await pathology results. Thornton Park MD, MD 06/25/2019 2:37:42 PM This report has been signed electronically.

## 2019-06-25 NOTE — Progress Notes (Signed)
Pt's states no medical or surgical changes since previsit or office visit.  [name] took remp and [name] took vitals.

## 2019-06-25 NOTE — Patient Instructions (Signed)
Discharge instructions given. Handouts on polyps and hemorrhoids. Resume previous medications. YOU HAD AN ENDOSCOPIC PROCEDURE TODAY AT [facility]:   Refer to the procedure report that was given to you for any specific questions about what was found during the examination.  If the procedure report does not answer your questions, please call your gastroenterologist to clarify.  If you requested that your care partner not be given the details of your procedure findings, then the procedure report has been included in a sealed envelope for you to review at your convenience later.  YOU SHOULD EXPECT: Some feelings of bloating in the abdomen. Passage of more gas than usual.  Walking can help get rid of the air that was put into your GI tract during the procedure and reduce the bloating. If you had a lower endoscopy (such as a colonoscopy or flexible sigmoidoscopy) you may notice spotting of blood in your stool or on the toilet paper. If you underwent a bowel prep for your procedure, you may not have a normal bowel movement for a few days.  Please Note:  You might notice some irritation and congestion in your nose or some drainage.  This is from the oxygen used during your procedure.  There is no need for concern and it should clear up in a day or so.  SYMPTOMS TO REPORT IMMEDIATELY:   Following lower endoscopy (colonoscopy or flexible sigmoidoscopy):  Excessive amounts of blood in the stool  Significant tenderness or worsening of abdominal pains  Swelling of the abdomen that is new, acute  Fever of 100F or higher   Following upper endoscopy (EGD)  Vomiting of blood or coffee ground material  New chest pain or pain under the shoulder blades  Painful or persistently difficult swallowing  New shortness of breath  Fever of 100F or higher  Black, tarry-looking stools  For urgent or emergent issues, a gastroenterologist can be reached at any hour by calling [phone]  [phone].   DIET:  We do recommend a small meal at first, but then you may proceed to your regular diet.  Drink plenty of fluids but you should avoid alcoholic beverages for 24 hours.  ACTIVITY:  You should plan to take it easy for the rest of today and you should NOT DRIVE or use heavy machinery until tomorrow (because of the sedation medicines used during the test).    FOLLOW UP: Our staff will call the number listed on your records 48-72 hours following your procedure to check on you and address any questions or concerns that you may have regarding the information given to you following your procedure. If we do not reach you, we will leave a message.  We will attempt to reach you two times.  During this call, we will ask if you have developed any symptoms of COVID 19. If you develop any symptoms (ie: fever, flu-like symptoms, shortness of breath, cough etc.) before then, please call [phone].  If you test positive for Covid 19 in the 2 weeks post procedure, please call and report this information to [place].    If any biopsies were taken you will be contacted by phone or by letter within the next 1-3 weeks.  Please call us at [phone] if you have not heard about the biopsies in 3 weeks.    SIGNATURES/CONFIDENTIALITY: You and/or your care partner have signed paperwork which will be entered into your electronic medical record.  These signatures attest to the fact that that the information  above on your After Visit Summary has been reviewed and is understood.  Full responsibility of the confidentiality of this discharge information lies with you and/or your care-partner. 

## 2019-06-25 NOTE — Progress Notes (Signed)
Report to PACU, RN, vss, BBS= Clear.  

## 2019-06-27 ENCOUNTER — Telehealth: Payer: Self-pay

## 2019-06-27 NOTE — Telephone Encounter (Signed)
  Follow up Call-  Call back number 06/25/2019  Post procedure Call Back phone  # [phone] - no voicemail  Permission to leave phone message Yes  Some recent data might be hidden     Patient questions:  Do you have a fever, pain , or abdominal swelling? No. Pain Score  0 *  Have you tolerated food without any problems? Yes.    Have you been able to return to your normal activities? Yes.    Do you have any questions about your discharge instructions: Diet   No. Medications  No. Follow up visit  No.  Do you have questions or concerns about your Care? No.  Actions: * If pain score is 4 or above: No action needed, pain <4.  1. Have you developed a fever since your procedure? no  2.   Have you had an respiratory symptoms (SOB or cough) since your procedure? no  3.   Have you tested positive for COVID 19 since your procedure no  4.   Have you had any family members/close contacts diagnosed with the COVID 19 since your procedure?  no   If yes to any of these questions please route to Joylene John, RN and Alphonsa Gin, Therapist, sports.

## 2019-07-03 ENCOUNTER — Other Ambulatory Visit: Payer: Self-pay | Admitting: *Deleted

## 2019-07-03 ENCOUNTER — Telehealth: Payer: Self-pay | Admitting: *Deleted

## 2019-07-03 MED ORDER — PANTOPRAZOLE SODIUM 40 MG PO TBEC: 40.0000 mg | DELAYED_RELEASE_TABLET | Freq: Two times a day (BID) | ORAL | 0 refills | Status: DC

## 2019-07-03 MED ORDER — BIS SUBCIT-METRONID-TETRACYC 140-125-125 MG PO CAPS: 3.0000 | ORAL_CAPSULE | Freq: Three times a day (TID) | ORAL | 0 refills | Status: DC

## 2019-07-03 NOTE — Telephone Encounter (Signed)
Entered in error

## 2019-07-08 DIAGNOSIS — R42 Dizziness and giddiness: Secondary | ICD-10-CM | POA: Diagnosis not present

## 2019-07-16 DIAGNOSIS — R42 Dizziness and giddiness: Secondary | ICD-10-CM | POA: Diagnosis not present

## 2019-07-23 DIAGNOSIS — R42 Dizziness and giddiness: Secondary | ICD-10-CM | POA: Diagnosis not present

## 2019-07-31 DIAGNOSIS — R42 Dizziness and giddiness: Secondary | ICD-10-CM | POA: Diagnosis not present

## 2019-08-06 ENCOUNTER — Telehealth: Payer: Self-pay | Admitting: Family Medicine

## 2019-08-06 DIAGNOSIS — R42 Dizziness and giddiness: Secondary | ICD-10-CM | POA: Diagnosis not present

## 2019-08-06 NOTE — Telephone Encounter (Signed)
Copied from [place] [phone]. Topic: General - Other >> Aug 06, 2019 12:09 PM Carolyn Stare wrote: Myles Lipps with Dr Melene Plan a ENT would like a call back to discuss pt, there are some concerns   [phone]   or  [phone]

## 2019-08-07 ENCOUNTER — Telehealth: Payer: Self-pay | Admitting: Family Medicine

## 2019-08-07 NOTE — Telephone Encounter (Signed)
Call from PT with Dr. Verlan Friends office.  Concerns with pt's BP up to SBP of 160.  Pt also advised them she is not checking her sugars.  PT feels that patient needs follow up appt with PCP and is now agreeable to do so.  Attempted call to pt.  No answer, no v/m.

## 2019-08-07 NOTE — Telephone Encounter (Signed)
Called pt and pt's daughter (on her dpr) twice to schedule an appt with Dr. Pamella Pert asap. I put a 1:40 appt slot on hold for this patient. If not reached by the end of the day, I will unblock the spot for someone else.

## 2019-08-07 NOTE — Telephone Encounter (Signed)
Tried to call pt multiple time so we can schedule her an office visit with PCP. There has been no call back or response at this time. There is no voicemail set up so will try again at a later time.

## 2019-09-02 ENCOUNTER — Other Ambulatory Visit: Payer: Self-pay | Admitting: *Deleted

## 2019-09-02 ENCOUNTER — Encounter: Payer: Self-pay | Admitting: Rehabilitative and Restorative Service Providers"

## 2019-09-02 ENCOUNTER — Telehealth: Payer: Self-pay | Admitting: *Deleted

## 2019-09-02 DIAGNOSIS — R1032 Left lower quadrant pain: Secondary | ICD-10-CM

## 2019-09-02 NOTE — Telephone Encounter (Signed)
FYI-  Dr. Tarri Glenn had no availability until 11/10, the patient continued abd pain and could not wait a month to be seen so she has been scheduled with Ellouise Newer on 10/16 at 1:30 pm.

## 2019-09-02 NOTE — Telephone Encounter (Signed)
Spoke to the patient to remind the patient to come in her h pylori stool test. Lab orders in Epic.   The patient reports she is still having abd pain. She reports it has "eased up a little" but reiterates that she is still in pain and "full of gas." The patient will come in this week for her h pylori stool check. Told the patient that I would report her continued to pain to Dr. Tarri Glenn who would be back in the office tomorrow. Patient verbalized understanding.

## 2019-09-02 NOTE — Telephone Encounter (Signed)
I'm thankful that she won't have to wait for a follow-up visit. Thanks for scheduling.

## 2019-09-02 NOTE — Telephone Encounter (Signed)
Please offer her a follow-up appointment. We will await the H pylori results to determine next steps. Thanks.

## 2019-09-02 NOTE — Therapy (Signed)
[facility] [address] [place], [place], [address] Phone: [phone]   Fax:  [phone]  Patient Details  Name: Alicia [name] MRN: [id number] Date of Birth: 08/10/1951 Referring Provider:  No ref. provider found  Encounter Date: last encounter 02/06/2019  PHYSICAL THERAPY DISCHARGE SUMMARY  Visits from Start of Care: 6  Current functional level related to goals / functional outcomes: PT Short Term Goals - 02/06/19 1303      PT SHORT TERM GOAL #1   Title  The patient will be indep with HEP for LE strengthening, standing balance, habituation for motion sensitivity, gaze and general mobility.    Time  4    Period  Weeks    Status  Achieved    Target Date  02/02/19      PT SHORT TERM GOAL #2   Title  The patient will perform sit>stand without UE support 5/5 trials to demo improving LE strength for functional tasks.    Time  4    Period  Weeks    Status  Achieved    Target Date  02/02/19      PT SHORT TERM GOAL #3   Title  The patient will improve gait speed from 1.74 ft/sec to > or equal to 2.4 ft/sec to demo dec'ing risk for fall and improved mobility.    Baseline  2.31 ft/sec on 01/31/2019    Time  4    Period  Weeks    Status  Partially Met    Target Date  02/02/19      PT SHORT TERM GOAL #4   Title  The patient will negotiate 4 steps with one handrail and a reciprocal pattern with supervision.    Time  4    Period  Weeks    Status  Not Met    Target Date  02/02/19      PT SHORT TERM GOAL #5   Title  Complete Berg balance test to establish a baseline.    Baseline  Baseline is 30/56 on 01/23/2019    Time  4    Period  Weeks    Status  Achieved    Target Date  02/02/19      PT Long Term Goals - 02/06/19 1305      PT LONG TERM GOAL #1   Title  The patient will be indep with progression of HEP.    Time  8    Period  Weeks      PT LONG TERM GOAL #2   Title  The patient will improve berg  balance score by 8 points from baseline (established for STGs).    Baseline  30/56 at baseline.    Time  8    Period  Weeks      PT LONG TERM GOAL #3   Title  The patient will reduce DHI from 72% to < or equal to 55% to demo dec'd self perception of dizziness    Time  8    Period  Weeks      PT LONG TERM GOAL #4   Title  The patient will improve gait speed from 1.74 ft/sec to > equal to 2.6 ft/sec to demo improving community gait.    Baseline  2.50 ft/sec    Time  8    Period  Weeks    Status  Partially Met      PT LONG TERM GOAL #5   Title  The patient will ambulate  short, community distances x 500 ft with SPC mod indep.    Baseline  Walks 345 ft nonstop today.    Time  8    Period  Weeks    Status  Partially Met      *GOALS NOT FULLY REASSESSED.  Clinic closed due to Covid.  Telephone messagesleft with patient-- did not return.   Remaining deficits: See last note 02/06/19   Education / Equipment: Home program.  Plan: Patient agrees to discharge.  Patient goals were partially met. Patient is being discharged due to not returning since the last visit.  ?????     Thank you for the referral of this patient. Rudell Cobb, MPT    [facility] 09/02/2019, 11:02 AM  [facility] [address] [place], [place], [address] Phone: [phone]   Fax:  [phone]

## 2019-09-06 ENCOUNTER — Ambulatory Visit (INDEPENDENT_AMBULATORY_CARE_PROVIDER_SITE_OTHER): Payer: Medicare HMO | Admitting: Physician Assistant

## 2019-09-06 ENCOUNTER — Other Ambulatory Visit: Payer: Self-pay

## 2019-09-06 ENCOUNTER — Encounter: Payer: Self-pay | Admitting: Physician Assistant

## 2019-09-06 ENCOUNTER — Other Ambulatory Visit: Payer: Medicare HMO

## 2019-09-06 ENCOUNTER — Ambulatory Visit (INDEPENDENT_AMBULATORY_CARE_PROVIDER_SITE_OTHER)
Admission: RE | Admit: 2019-09-06 | Discharge: 2019-09-06 | Disposition: A | Discharge status: Home / Self Care | Payer: Medicare HMO | Source: Ambulatory Visit | Attending: Physician Assistant | Admitting: Physician Assistant

## 2019-09-06 VITALS — BP 220/100 | HR 102 | Temp 97.4°F | Ht 68.0 in | Wt 252.4 lb

## 2019-09-06 DIAGNOSIS — R1032 Left lower quadrant pain: Secondary | ICD-10-CM

## 2019-09-06 DIAGNOSIS — K5900 Constipation, unspecified: Secondary | ICD-10-CM

## 2019-09-06 DIAGNOSIS — Z8619 Personal history of other infectious and parasitic diseases: Secondary | ICD-10-CM

## 2019-09-06 DIAGNOSIS — R1013 Epigastric pain: Secondary | ICD-10-CM

## 2019-09-06 MED ORDER — PANTOPRAZOLE SODIUM 40 MG PO TBEC: 40.0000 mg | DELAYED_RELEASE_TABLET | Freq: Two times a day (BID) | ORAL | 3 refills | Status: DC

## 2019-09-06 NOTE — Progress Notes (Signed)
Reviewed. I agree with documentation including the assessment and plan.  Shahidah Nesbitt L. Randall Colden, MD, MPH 

## 2019-09-06 NOTE — Patient Instructions (Signed)
If you are age 68 or older, your body mass index should be between 23-30. Your Body mass index is 38.37 kg/m. If this is out of the aforementioned range listed, please consider follow up with your Primary Care Provider.  If you are age 20 or younger, your body mass index should be between 19-25. Your Body mass index is 38.37 kg/m. If this is out of the aformentioned range listed, please consider follow up with your Primary Care Provider.   Your provider has requested that you go to the basement level for an X-ray before leaving today. Press "B" on the elevator. The lab is located at the first door on the left as you exit the elevator.  Start Miralax twice daily.  We have sent the following medications to your pharmacy for you to pick up at your convenience: Pantoprazole 40 mg  We will call you with results.  Thank you for choosing me and [facility].    Ellouise Newer, PA-C

## 2019-09-06 NOTE — Progress Notes (Signed)
Chief Complaint: Continued abdominal pain  HPI:    Alicia [name] is a 68 year old African-American female, known to Dr. Tarri Glenn, who returns to clinic today for continued abdominal pain.    05/16/2019 patient had a telemedicine visit with Dr. Tarri Glenn.  At that time patient described a left lower quadrant abdominal pain with associated gas and nausea and recent change in bowel habits.  She had a noncontrast CT 05/10/2019 with no source, normal LFTs, lipase, CBC on 05/10/2019.  Empirically treated for diverticulitis with Cipro and Flagyl without significant change in symptoms and improved with defecation.  Constipation was improving with MiraLAX.  At that time recommended colonoscopy and EGD for further eval.  She was continued on MiraLAX for constipation.  Also continued on dicyclomine.    06/25/2019 colonoscopy with diverticulosis in the entire examined colon, one 1 mm polyp in the ascending colon, 1 3 mm polyp in the rectum and otherwise normal.  EGD on the same with irregular Z line, erythematous mucosa in the gastric body and antrum, small hiatal hernia, multiple gastric polyps which were thought likely fundic gland polyps and normal examined duodenum.  Biopsies came back positive for H. pylori and she was started on treatment.  Repeat colonoscopy recommended in 7 years due to adenomatous polyps.    09/06/2019 H. pylori special antigen testing in progress.    Today, patient presents clinic and explains that she continues to hurt in her abdomen.  When she is explaining her hurting she really rubs all over her stomach but tells me that the worst of this is in her left lower quadrant and tends to wrap around to her back.  This does feel somewhat better when she has a good bowel movement but "I have not had a good bowel movement in quite some time".  Patient stopped the MiraLAX after being diagnosed with H. pylori and she was not sure she was supposed to stay on this.  When she was on MiraLAX once daily it  "helped a little bit".  Associated symptoms include a lot of gas and bloating.    Also describes an epigastric pain and hurting which runs underneath her breasts, describes this as a "burning".  Patient did finish a full course of Pylera but then also stopped her Pantoprazole afterwards as she was not sure she was supposed to stay on this.    Denies fever, chills, weight loss, nausea, vomiting blood in her stool or symptoms that awaken her from sleep.  Past Medical History:  Diagnosis Date   Allergy    Arthritis    Chronic headaches    Constipation    Depression    Diabetes mellitus    Hyperlipidemia    Hypertension    Obesity    PAD (peripheral artery disease) (HCC)    Shortness of breath    Stroke [facility])     Past Surgical History:  Procedure Laterality Date   ABDOMINAL AORTOGRAM W/LOWER EXTREMITY N/A 12/17/2018   Procedure: ABDOMINAL AORTOGRAM W/LOWER EXTREMITY;  Surgeon: Waynetta Sandy, MD;  Location: [place] [facility];  Service: Cardiovascular;  Laterality: N/A;   FEMORAL BYPASS  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 Bridgett Larsson   PERIPHERAL VASCULAR ATHERECTOMY Left 12/17/2018   Procedure: PERIPHERAL VASCULAR ATHERECTOMY;  Surgeon: Waynetta Sandy, MD;  Location: [place] [facility];  Service: Cardiovascular;  Laterality: Left;   PERIPHERAL VASCULAR BALLOON ANGIOPLASTY Left 12/17/2018   Procedure: PERIPHERAL VASCULAR BALLOON ANGIOPLASTY;  Surgeon:  Waynetta Sandy, MD;  Location: [place] [facility];  Service: Cardiovascular;  Laterality: Left;    Current Outpatient Medications  Medication Sig Dispense Refill   acetaminophen (TYLENOL) 500 MG tablet Take 1,000 mg by mouth every 6 (six) hours as needed for moderate pain or headache.      aspirin 81 MG EC tablet Take 1 tablet (81 mg total) by mouth daily. 30 tablet 0   bismuth-metronidazole-tetracycline (PYLERA) 140-125-125 MG capsule Take 3 capsules by mouth  4 (four) times daily -  before meals and at bedtime for 10 days. 120 capsule 0   dicyclomine (BENTYL) 20 MG tablet Take 1 tablet (20 mg total) by mouth 2 (two) times daily. (Patient not taking: Reported on 06/25/2019) 20 tablet 0   enalapril (VASOTEC) 20 MG tablet Take 1 tablet (20 mg total) by mouth 2 (two) times daily. 180 tablet 1   fluticasone (FLONASE) 50 MCG/ACT nasal spray Place 1 spray into both nostrils 2 (two) times daily. (Patient taking differently: Place 1 spray into both nostrils 2 (two) times daily as needed for allergies or rhinitis. ) 16 g 6   glucose blood test strip Use as instructed 100 each 12   metFORMIN (GLUCOPHAGE) 1000 MG tablet Take 1 tablet (1,000 mg total) by mouth 2 (two) times daily with a meal. 180 tablet 1   metoprolol succinate (TOPROL-XL) 50 MG 24 hr tablet Take 1 tablet (50 mg total) by mouth daily. (Patient not taking: Reported on 06/11/2019) 90 tablet 1   NIFEdipine (ADALAT CC) 60 MG 24 hr tablet Take 1 tablet (60 mg total) by mouth daily. 90 tablet 1   pantoprazole (PROTONIX) 40 MG tablet Take 1 tablet (40 mg total) by mouth 2 (two) times daily for 10 days. 20 tablet 0   sitaGLIPtin (JANUVIA) 50 MG tablet Take 1 tablet (50 mg total) by mouth daily. 90 tablet 1   spironolactone (ALDACTONE) 25 MG tablet Take 1 tablet (25 mg total) by mouth daily. 90 tablet 1   Current Facility-Administered Medications  Medication Dose Route Frequency Provider Last Rate Last Dose   0.9 %  sodium chloride infusion  500 mL Intravenous Once Thornton Park, MD        Allergies as of 09/06/2019 - Review Complete 06/25/2019  Allergen Reaction Noted   Atorvastatin Swelling 04/22/2019   Nifedipine er Other (See Comments) 04/03/2018   Latex Rash 04/03/2018    Family History  Problem Relation Age of Onset   Diabetes Father    Heart disease Mother        NOT before age 26-  Bypass   Hypertension Mother    Hypertension Sister    Varicose Veins Brother     Heart disease Brother        Before age 79   Hypertension Daughter    Colon [name] Neg Hx    Esophageal [name] Neg Hx    Rectal [name] Neg Hx    Stomach [name] Neg Hx     Social History   Socioeconomic History   Marital status: Single    Spouse name: Not on file   Number of children: Not on file   Years of education: Not on file   Highest education level: Not on file  Occupational History   Not on file  Social Needs   Financial resource strain: Not on file   Food insecurity    Worry: Not on file    Inability: Not on file   Transportation needs  Medical: Not on file    Non-medical: Not on file  Tobacco Use   Smoking status: Light Tobacco Smoker    Packs/day: 0.50    Years: 35.00    Pack years: 17.50    Types: Cigarettes   Smokeless tobacco: Never Used   Tobacco comment: patient is trying to quit and has gone down to half a pack per day  Substance and Sexual Activity   Alcohol use: Yes    Alcohol/week: 1.0 standard drinks    Types: 1 Standard drinks or equivalent per week    Comment: occasional use only   Drug use: No   Sexual activity: Not Currently  Lifestyle   Physical activity    Days per week: Not on file    Minutes per session: Not on file   Stress: Not on file  Relationships   Social connections    Talks on phone: Not on file    Gets together: Not on file    Attends religious service: Not on file    Active member of club or organization: Not on file    Attends meetings of clubs or organizations: Not on file    Relationship status: Not on file   Intimate partner violence    Fear of current or ex partner: Not on file    Emotionally abused: Not on file    Physically abused: Not on file    Forced sexual activity: Not on file  Other Topics Concern   Not on file  Social History Narrative   Not on file    Review of Systems:    Constitutional: No weight loss, fever or chills Cardiovascular: No chest pain Respiratory: No SOB    Gastrointestinal: See HPI and otherwise negative   Physical Exam:  Vital signs: BP (!) 220/100 (BP Location: Left Arm, Patient Position: Sitting, Cuff Size: Large)    Pulse (!) 102    Temp (!) 97.4 F (36.3 C) (Other (Comment))    Ht 5\' 8"  (1.727 m)    Wt 252 lb 6 oz (114.5 kg)    BMI 38.37 kg/m   Constitutional:   Pleasant obese AA female appears to be in NAD, Well developed, Well nourished, alert and cooperative Respiratory: Respirations even and unlabored. Lungs clear to auscultation bilaterally.   No wheezes, crackles, or rhonchi.  Cardiovascular: Normal S1, S2. No MRG. Regular rate and rhythm. No peripheral edema, cyanosis or pallor.  Gastrointestinal:  Soft, nondistended, mild epigastric ttp, Moderate LLQ ttp. No rebound or guarding. Normal bowel sounds. No appreciable masses or hepatomegaly. Rectal:  Not performed.  Psychiatric:  Demonstrates good judgement and reason without abnormal affect or behaviors.  RELEVANT LABS AND IMAGING: CBC    Component Value Date/Time   WBC 6.2 04/22/2019 1255   RBC 4.45 04/22/2019 1255   HGB 13.0 04/22/2019 1255   HCT 40.8 04/22/2019 1255   PLT 319 04/22/2019 1255   MCV 91.7 04/22/2019 1255   MCH 29.2 04/22/2019 1255   MCHC 31.9 04/22/2019 1255   RDW 14.1 04/22/2019 1255   LYMPHSABS 1.3 04/03/2018 1617   MONOABS 0.5 04/03/2018 1617   EOSABS 0.2 04/03/2018 1617   BASOSABS 0.2 (H) 04/03/2018 1617    CMP     Component Value Date/Time   NA 142 04/22/2019 1255   NA 141 02/07/2019 1205   K 3.9 04/22/2019 1255   CL 106 04/22/2019 1255   CO2 26 04/22/2019 1255   GLUCOSE 163 (H) 04/22/2019 1255   BUN 14 04/22/2019 1255  BUN 15 02/07/2019 1205   CREATININE 1.25 (H) 04/22/2019 1255   CALCIUM 9.0 04/22/2019 1255   PROT 6.5 04/22/2019 1255   PROT 7.0 02/07/2019 1205   ALBUMIN 3.2 (L) 04/22/2019 1255   ALBUMIN 3.8 02/07/2019 1205   AST 10 (L) 04/22/2019 1255   ALT 9 04/22/2019 1255   ALKPHOS 110 04/22/2019 1255   BILITOT 0.4 04/22/2019  1255   BILITOT 0.3 02/07/2019 1205   GFRNONAA 44 (L) 04/22/2019 1255   GFRAA 51 (L) 04/22/2019 1255    Assessment: 1.  Left lower quadrant pain: CT and labs reviewed as well as recent colonoscopy, likely this is related to constipation/IBS-C versus musculoskeletal/hip pain 2.  Constipation: Was better with MiraLAX but patient stopped this a couple of months ago because she was not sure she was post to stay on it 3.  Epigastric pain: Consider continued H. pylori versus other 4.  History of H. pylori: Treated with Pylera and Pantoprazole 40 twice daily for 10 days, continues with pain  Plan: 1.  H. pylori special antigen testing is pending 2.  Restart the patient on Pantoprazole 40 mg daily, 30-60 minutes before breakfast #30 with 3 refills 3.  Recommend the patient restart her MiraLAX, discussed that she can take this up to 4 times a day if necessary.  She will titrate to a soft solid bowel movement per day. 4.  Ordered a KUB 1 view today to see if the patient is constipated like she describes.  Discussed with patient that this could be the source of her left lower quadrant pain given that it is some better with a bowel movement.  Previous CT and labs have been negative. 5.  Patient to follow in clinic as needed/recommended after testing and labs above.  Ellouise Newer, PA-C [facility] 09/06/2019, 1:26 PM  Cc: Rutherford Guys, MD

## 2019-09-09 ENCOUNTER — Telehealth: Payer: Self-pay

## 2019-09-09 LAB — HELICOBACTER PYLORI  SPECIAL ANTIGEN
MICRO NUMBER:: 998529
SPECIMEN QUALITY: ADEQUATE

## 2019-09-09 NOTE — Telephone Encounter (Signed)
-----   Message from Levin Erp, [place] sent at 09/09/2019  8:25 AM EDT ----- Patient is constipated as we discussed. No change in recommendations. Thanks-JLL

## 2019-09-09 NOTE — Telephone Encounter (Signed)
Called twice with no voice mail, to give results of xray. Will try again later

## 2019-09-11 ENCOUNTER — Telehealth: Payer: Self-pay

## 2019-09-11 NOTE — Telephone Encounter (Signed)
Still unable to reach patient by phone, and no voice mail. Will send letter with results

## 2019-09-11 NOTE — Telephone Encounter (Signed)
-----   Message from Levin Erp, [place] sent at 09/09/2019  8:25 AM EDT ----- Patient is constipated as we discussed. No change in recommendations. Thanks-JLL

## 2019-10-11 ENCOUNTER — Telehealth: Payer: Self-pay | Admitting: *Deleted

## 2019-10-11 NOTE — Telephone Encounter (Signed)
Schedule AWV.  

## 2019-12-27 ENCOUNTER — Other Ambulatory Visit: Payer: Self-pay | Admitting: Family Medicine

## 2019-12-27 DIAGNOSIS — E1169 Type 2 diabetes mellitus with other specified complication: Secondary | ICD-10-CM

## 2019-12-27 DIAGNOSIS — E669 Obesity, unspecified: Secondary | ICD-10-CM

## 2019-12-27 DIAGNOSIS — I10 Essential (primary) hypertension: Secondary | ICD-10-CM

## 2019-12-27 NOTE — Telephone Encounter (Signed)
Medication Refill - Medication: metFORMIN (GLUCOPHAGE) 1000 MG tablet enalapril (VASOTEC) 20 MG tablet NIFEdipine (ADALAT CC) 60 MG 24 hr tablet    Has the patient contacted their pharmacy? Yes.   (Agent: If no, request that the patient contact the pharmacy for the refill.) (Agent: If yes, when and what did the pharmacy advise?)  Preferred Pharmacy (with phone number or street name):  [facility] ([address] [place] [address]  [address] [place]) [place] [address]  Phone: [phone] Fax: [phone]    Agent: Please be advised that RX refills may take up to 3 business days. We ask that you follow-up with your pharmacy.

## 2019-12-27 NOTE — Telephone Encounter (Signed)
Requested medication (s) are due for refill today: Metformin, Enalapril, & Nifedipine  Requested medication (s) are on the active medication list: yes  Last refill:  02/07/2019  Future visit scheduled:no  Notes to clinic: no valid encounter within last 6 months    Requested Prescriptions  Pending Prescriptions Disp Refills   metFORMIN (GLUCOPHAGE) 1000 MG tablet 180 tablet 1    Sig: Take 1 tablet (1,000 mg total) by mouth 2 (two) times daily with a meal.      Endocrinology:  Diabetes - Biguanides Failed - 12/27/2019 11:29 AM      Failed - Cr in normal range and within 360 days    Creatinine, Ser  Date Value Ref Range Status  04/22/2019 1.25 (H) 0.44 - 1.00 mg/dL Final          Failed - HBA1C is between 0 and 7.9 and within 180 days    Hemoglobin A1C  Date Value Ref Range Status  02/07/2019 7.5 (A) 4.0 - 5.6 % Final   Hgb A1c MFr Bld  Date Value Ref Range Status  10/09/2018 7.1 (H) 4.8 - 5.6 % Final    Comment:             Prediabetes: 5.7 - 6.4          Diabetes: >6.4          Glycemic control for adults with diabetes: <7.0           Failed - eGFR in normal range and within 360 days    GFR calc Af Amer  Date Value Ref Range Status  04/22/2019 51 (L) >60 mL/min Final   GFR calc non Af Amer  Date Value Ref Range Status  04/22/2019 44 (L) >60 mL/min Final          Failed - Valid encounter within last 6 months    Recent Outpatient Visits           7 months ago LLQ abdominal pain   [facility] Lilia Argue, MD   10 months ago Diabetes mellitus type 2 in obese [facility])   [facility], Lilia Argue, MD   1 year ago Diabetes mellitus type 2 in obese [facility])   [facility], Lilia Argue, MD   1 year ago Vertigo   [facility], Lilia Argue, MD   1 year ago Diabetes mellitus type 2 in obese [facility])   [facility], Lilia Argue, MD                enalapril (VASOTEC) 20 MG tablet 180 tablet 1   Sig: Take 1 tablet (20 mg total) by mouth 2 (two) times daily.      Cardiovascular:  ACE Inhibitors Failed - 12/27/2019 11:29 AM      Failed - Cr in normal range and within 180 days    Creatinine, Ser  Date Value Ref Range Status  04/22/2019 1.25 (H) 0.44 - 1.00 mg/dL Final          Failed - K in normal range and within 180 days    Potassium  Date Value Ref Range Status  04/22/2019 3.9 3.5 - 5.1 mmol/L Final          Failed - Last BP in normal range    BP Readings from Last 1 Encounters:  09/06/19 (!) 220/100          Failed - Valid encounter within last  6 months    Recent Outpatient Visits           7 months ago LLQ abdominal pain   [facility], Lilia Argue, MD   10 months ago Diabetes mellitus type 2 in obese [facility])   [facility], Lilia Argue, MD   1 year ago Diabetes mellitus type 2 in obese [facility])   [facility], Lilia Argue, MD   1 year ago Vertigo   [facility], Lilia Argue, MD   1 year ago Diabetes mellitus type 2 in obese [facility])   [facility], Lilia Argue, MD              Passed - Patient is not pregnant        NIFEdipine (ADALAT CC) 60 MG 24 hr tablet 90 tablet 1    Sig: Take 1 tablet (60 mg total) by mouth daily.      Cardiovascular:  Calcium Channel Blockers Failed - 12/27/2019 11:29 AM      Failed - Last BP in normal range    BP Readings from Last 1 Encounters:  09/06/19 (!) 220/100          Failed - Valid encounter within last 6 months    Recent Outpatient Visits           7 months ago LLQ abdominal pain   [facility], Lilia Argue, MD   10 months ago Diabetes mellitus type 2 in obese [facility])   [facility], Lilia Argue, MD   1 year ago Diabetes mellitus type 2 in obese [facility])   [facility] Lilia Argue, MD   1 year ago Vertigo   [facility], Lilia Argue, MD   1 year ago Diabetes mellitus type 2 in obese [facility])    [facility], Lilia Argue, MD

## 2019-12-30 NOTE — Telephone Encounter (Signed)
Called pt 2x and no answer DPR does not allow to leave voicemail

## 2020-01-02 ENCOUNTER — Ambulatory Visit: Payer: Medicare HMO | Admitting: Family Medicine

## 2020-01-06 ENCOUNTER — Other Ambulatory Visit: Payer: Self-pay

## 2020-01-06 ENCOUNTER — Ambulatory Visit (INDEPENDENT_AMBULATORY_CARE_PROVIDER_SITE_OTHER): Payer: Medicare HMO | Admitting: Family Medicine

## 2020-01-06 ENCOUNTER — Encounter: Payer: Self-pay | Admitting: Family Medicine

## 2020-01-06 VITALS — BP 140/62 | HR 94 | Temp 97.6°F | Ht 68.0 in | Wt 259.0 lb

## 2020-01-06 DIAGNOSIS — E669 Obesity, unspecified: Secondary | ICD-10-CM

## 2020-01-06 DIAGNOSIS — E785 Hyperlipidemia, unspecified: Secondary | ICD-10-CM

## 2020-01-06 DIAGNOSIS — Z8673 Personal history of transient ischemic attack (TIA), and cerebral infarction without residual deficits: Secondary | ICD-10-CM | POA: Diagnosis not present

## 2020-01-06 DIAGNOSIS — E1169 Type 2 diabetes mellitus with other specified complication: Secondary | ICD-10-CM | POA: Diagnosis not present

## 2020-01-06 DIAGNOSIS — Z23 Encounter for immunization: Secondary | ICD-10-CM | POA: Diagnosis not present

## 2020-01-06 DIAGNOSIS — I10 Essential (primary) hypertension: Secondary | ICD-10-CM

## 2020-01-06 DIAGNOSIS — R42 Dizziness and giddiness: Secondary | ICD-10-CM | POA: Diagnosis not present

## 2020-01-06 LAB — POCT GLYCOSYLATED HEMOGLOBIN (HGB A1C): Hemoglobin A1C: 7.3 % — AB (ref 4.0–5.6)

## 2020-01-06 MED ORDER — NIFEDIPINE ER 60 MG PO TB24: 60.0000 mg | ORAL_TABLET | Freq: Every day | ORAL | 1 refills | Status: DC

## 2020-01-06 MED ORDER — SPIRONOLACTONE 25 MG PO TABS: 25.0000 mg | ORAL_TABLET | Freq: Every day | ORAL | 1 refills | Status: DC

## 2020-01-06 MED ORDER — BLOOD GLUCOSE METER KIT: PACK | 11 refills | Status: DC

## 2020-01-06 MED ORDER — SITAGLIPTIN PHOSPHATE 50 MG PO TABS: 50.0000 mg | ORAL_TABLET | Freq: Every day | ORAL | 1 refills | Status: DC

## 2020-01-06 MED ORDER — ENALAPRIL MALEATE 20 MG PO TABS: 20.0000 mg | ORAL_TABLET | Freq: Two times a day (BID) | ORAL | 1 refills | Status: DC

## 2020-01-06 MED ORDER — METOPROLOL SUCCINATE ER 50 MG PO TB24: 50.0000 mg | ORAL_TABLET | Freq: Every day | ORAL | 1 refills | Status: DC

## 2020-01-06 MED ORDER — METFORMIN HCL 1000 MG PO TABS: 1000.0000 mg | ORAL_TABLET | Freq: Two times a day (BID) | ORAL | 1 refills | Status: DC

## 2020-01-06 NOTE — Patient Instructions (Signed)
° ° ° °  If you have lab work done today you will be contacted with your lab results within the next 2 weeks.  If you have not heard from us then please contact us. The fastest way to get your results is to register for My Chart. ° ° °IF you received an x-ray today, you will receive an invoice from [facility]. Please contact [facility] at [phone] with questions or concerns regarding your invoice.  ° °IF you received labwork today, you will receive an invoice from [facility]. Please contact [facility] at [phone] with questions or concerns regarding your invoice.  ° °Our billing staff will not be able to assist you with questions regarding bills from these companies. ° °You will be contacted with the lab results as soon as they are available. The fastest way to get your results is to activate your My Chart account. Instructions are located on the last page of this paperwork. If you have not heard from us regarding the results in 2 weeks, please contact this office. °  ° ° ° °

## 2020-01-06 NOTE — Progress Notes (Signed)
2/15/20219:36 AM  Alicia [name] Oct 20, 1951, 69 y.o., female [id number]  Chief Complaint  Patient presents with  . Constipation    for the past weeks, taking metamucil  . Medication Refill    has supplements that she wants to discuss, making appt for mammo, declines foot exam today get monitored somewhere else    HPI:   Patient is a 69 y.o. female with past medical history significant for HTN, DM2, HLP, PAD w hx L fem-pop bypass, CVA, dCHF, smoker, diverticulitis who presents today for routine followup  Last OV June 2020 - referred to GI  Continues to struggle with dizziness - was doing PT, which has not been sign helpful as she was having sign issues with diverticulitis Saw ENT in Jan 2020 - multifactorial, neg hill-pike Feels that dizziness has been getting better but not resolved, limits her mobility significantly Uses a cane, no falls  Has been checking cbgs - doing so-so Has been getting a bit higher, fastings Last check 187, has been eating more sweets Needs new glucometer and supplies  Has recently ordered probiotics as has been struggling with constipation She has also been using metamucil  Lab Results  Component Value Date   HGBA1C 7.5 (A) 02/07/2019   HGBA1C 7.1 (H) 10/09/2018   HGBA1C 7.8 (H) 06/21/2018   Lab Results  Component Value Date   LDLCALC 158 (H) 02/07/2019   CREATININE 1.25 (H) 04/22/2019    Depression screen PHQ 2/9 01/06/2020 05/10/2019 05/10/2019  Decreased Interest 0 0 0  Down, Depressed, Hopeless 0 0 0  PHQ - 2 Score 0 0 0    Fall Risk  01/06/2020 05/10/2019 05/10/2019 02/07/2019 11/12/2018  Falls in the past year? 0 0 0 0 0  Number falls in past yr: 0 0 0 0 -  Injury with Fall? 0 0 0 0 -     Allergies  Allergen Reactions  . Atorvastatin Swelling    Lip swelling  . Nifedipine Er Other (See Comments)    Caused nose bleeds in higher doses (90 mg., namely)  . Latex Rash    Prior to Admission medications   Medication Sig Start  Date End Date Taking? Authorizing Provider  acetaminophen (TYLENOL) 500 MG tablet Take 1,000 mg by mouth every 6 (six) hours as needed for moderate pain or headache.     [provider]  aspirin 81 MG EC tablet Take 1 tablet (81 mg total) by mouth daily. 04/06/18   Rutherford Guys, MD  bismuth-metronidazole-tetracycline [facility]) [phone] MG capsule Take 3 capsules by mouth 4 (four) times daily -  before meals and at bedtime for 10 days. 07/03/19 07/13/19  Thornton Park, MD  enalapril (VASOTEC) 20 MG tablet Take 1 tablet (20 mg total) by mouth 2 (two) times daily. 02/07/19   Rutherford Guys, MD  fluticasone (FLONASE) 50 MCG/ACT nasal spray Place 1 spray into both nostrils 2 (two) times daily. Patient taking differently: Place 1 spray into both nostrils 2 (two) times daily as needed for allergies or rhinitis.  06/21/18   Rutherford Guys, MD  glucose blood test strip Use as instructed 05/10/19   Rutherford Guys, MD  metFORMIN (GLUCOPHAGE) 1000 MG tablet Take 1 tablet (1,000 mg total) by mouth 2 (two) times daily with a meal. 02/07/19   Rutherford Guys, MD  metoprolol succinate (TOPROL-XL) 50 MG 24 hr tablet Take 1 tablet (50 mg total) by mouth daily. 02/07/19   Rutherford Guys, MD  NIFEdipine (ADALAT CC)  60 MG 24 hr tablet Take 1 tablet (60 mg total) by mouth daily. 02/07/19   Rutherford Guys, MD  pantoprazole (PROTONIX) 40 MG tablet Take 1 tablet (40 mg total) by mouth 2 (two) times daily for 10 days. Take 30-60 minutes before breakfast 09/06/19 09/16/19  Levin Erp, PA  sitaGLIPtin (JANUVIA) 50 MG tablet Take 1 tablet (50 mg total) by mouth daily. 02/07/19   Rutherford Guys, MD  spironolactone (ALDACTONE) 25 MG tablet Take 1 tablet (25 mg total) by mouth daily. 02/07/19   Rutherford Guys, MD    Past Medical History:  Diagnosis Date  . Allergy   . Arthritis   . Chronic headaches   . Constipation   . Depression   . Diabetes mellitus   . Hyperlipidemia   . Hypertension    . Obesity   . PAD (peripheral artery disease) ([place])   . Shortness of breath   . Stroke [facility])     Past Surgical History:  Procedure Laterality Date  . ABDOMINAL AORTOGRAM W/LOWER EXTREMITY N/A 12/17/2018   Procedure: ABDOMINAL AORTOGRAM W/LOWER EXTREMITY;  Surgeon: Waynetta Sandy, MD;  Location: [place] [facility];  Service: Cardiovascular;  Laterality: N/A;  . FEMORAL BYPASS  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 Bridgett Larsson  . PERIPHERAL VASCULAR ATHERECTOMY Left 12/17/2018   Procedure: PERIPHERAL VASCULAR ATHERECTOMY;  Surgeon: Waynetta Sandy, MD;  Location: [place] [facility];  Service: Cardiovascular;  Laterality: Left;  . PERIPHERAL VASCULAR BALLOON ANGIOPLASTY Left 12/17/2018   Procedure: PERIPHERAL VASCULAR BALLOON ANGIOPLASTY;  Surgeon: Waynetta Sandy, MD;  Location: [place] [facility];  Service: Cardiovascular;  Laterality: Left;    Social History   Tobacco Use  . Smoking status: Light Tobacco Smoker    Packs/day: 0.50    Years: 35.00    Pack years: 17.50    Types: Cigarettes  . Smokeless tobacco: Never Used  . Tobacco comment: patient is trying to quit and has gone down to half a pack per day  Substance Use Topics  . Alcohol use: Yes    Alcohol/week: 1.0 standard drinks    Types: 1 Standard drinks or equivalent per week    Comment: occasional use only    Family History  Problem Relation Age of Onset  . Diabetes Father   . Heart disease Mother        NOT before age 35-  Bypass  . Hypertension Mother   . Hypertension Sister   . Varicose Veins Brother   . Heart disease Brother        Before age 28  . Hypertension Daughter   . Colon cancer Neg Hx   . Esophageal cancer Neg Hx   . Rectal cancer Neg Hx   . Stomach cancer Neg Hx     Review of Systems  Constitutional: Negative for chills and fever.  Respiratory: Negative for cough and shortness of breath.   Cardiovascular: Negative for chest pain,  palpitations and leg swelling.  Gastrointestinal: Negative for abdominal pain, nausea and vomiting.  per hpi   OBJECTIVE:  Today's Vitals   01/06/20 0934  BP: 140/62  Pulse: 94  Temp: 97.6 F (36.4 C)  SpO2: 94%  Weight: 259 lb (117.5 kg)  Height: 5' 8"  (1.727 m)   Body mass index is 39.38 kg/m.  Wt Readings from Last 3 Encounters:  01/06/20 259 lb (117.5 kg)  09/06/19 252 lb 6 oz (114.5 kg)  06/25/19  253 lb (114.8 kg)    Physical Exam Vitals and nursing note reviewed.  Constitutional:      Appearance: She is well-developed.  HENT:     Head: Normocephalic and atraumatic.     Mouth/Throat:     Pharynx: No oropharyngeal exudate.  Eyes:     General: No scleral icterus.    Conjunctiva/sclera: Conjunctivae normal.     Pupils: Pupils are equal, round, and reactive to light.  Cardiovascular:     Rate and Rhythm: Normal rate and regular rhythm.     Heart sounds: Normal heart sounds. No murmur. No friction rub. No gallop.   Pulmonary:     Effort: Pulmonary effort is normal.     Breath sounds: Normal breath sounds. No wheezing or rales.  Musculoskeletal:     Cervical back: Neck supple.  Skin:    General: Skin is warm and dry.  Neurological:     Mental Status: She is alert and oriented to person, place, and time.     Results for orders placed or performed in visit on 01/06/20 (from the past 24 hour(s))  POCT glycosylated hemoglobin (Hb A1C)     Status: Abnormal   Collection Time: 01/06/20  9:57 AM  Result Value Ref Range   Hemoglobin A1C 7.3 (A) 4.0 - 5.6 %   HbA1c POC (<> result, manual entry)     HbA1c, POC (prediabetic range)     HbA1c, POC (controlled diabetic range)      No results found.   ASSESSMENT and PLAN  1. Diabetes mellitus type 2 in obese [facility]) Control acceptable given age and co-morbidities, concern for increase risk of hypoglycemia in at high fall risk patient. Discussed LFM. - Ambulatory referral to Ophthalmology - POCT glycosylated  hemoglobin (Hb A1C) - Microalbumin / creatinine urine ratio - TSH - Lipid panel - CMP14+EGFR  2. Essential hypertension, benign Controlled. Continue current regime.  - TSH - Lipid panel - CMP14+EGFR  3. Hyperlipidemia LDL goal <70 Checking labs today, medications will be adjusted as needed.  - TSH - Lipid panel - CMP14+EGFR  4. Need for vaccination - Pneumococcal polysaccharide vaccine 23-valent greater than or equal to 2yo subcutaneous/IM  5. Dizziness 6. H/O: CVA (cerebrovascular accident) - Ambulatory referral to Physical Therapy  Other orders - metFORMIN (GLUCOPHAGE) 1000 MG tablet; Take 1 tablet (1,000 mg total) by mouth 2 (two) times daily with a meal. - sitaGLIPtin (JANUVIA) 50 MG tablet; Take 1 tablet (50 mg total) by mouth daily. - enalapril (VASOTEC) 20 MG tablet; Take 1 tablet (20 mg total) by mouth 2 (two) times daily. - metoprolol succinate (TOPROL-XL) 50 MG 24 hr tablet; Take 1 tablet (50 mg total) by mouth daily. - NIFEdipine (ADALAT CC) 60 MG 24 hr tablet; Take 1 tablet (60 mg total) by mouth daily. - spironolactone (ALDACTONE) 25 MG tablet; Take 1 tablet (25 mg total) by mouth daily. - blood glucose meter kit and supplies; per insurance preference. Check cbg once a day. Dx E11.9  Return in about 3 months (around 04/04/2020).    Rutherford Guys, MD Primary [facility] at [place], [place] [address] Ph.  [phone] Fax [phone]

## 2020-01-07 LAB — CMP14+EGFR
ALT: 8 IU/L (ref 0–32)
AST: 10 IU/L (ref 0–40)
Albumin/Globulin Ratio: 1.3 (ref 1.2–2.2)
Albumin: 3.9 g/dL (ref 3.8–4.8)
Alkaline Phosphatase: 130 IU/L — ABNORMAL HIGH (ref 39–117)
BUN/Creatinine Ratio: 12 (ref 12–28)
BUN: 16 mg/dL (ref 8–27)
Bilirubin Total: 0.4 mg/dL (ref 0.0–1.2)
CO2: 21 mmol/L (ref 20–29)
Calcium: 8.9 mg/dL (ref 8.7–10.3)
Chloride: 104 mmol/L (ref 96–106)
Creatinine, Ser: 1.39 mg/dL — ABNORMAL HIGH (ref 0.57–1.00)
GFR calc Af Amer: 45 mL/min/{1.73_m2} — ABNORMAL LOW (ref >59)
GFR calc non Af Amer: 39 mL/min/{1.73_m2} — ABNORMAL LOW (ref >59)
Globulin, Total: 3 g/dL (ref 1.5–4.5)
Glucose: 179 mg/dL — ABNORMAL HIGH (ref 65–99)
Potassium: 4 mmol/L (ref 3.5–5.2)
Sodium: 141 mmol/L (ref 134–144)
Total Protein: 6.9 g/dL (ref 6.0–8.5)

## 2020-01-07 LAB — MICROALBUMIN / CREATININE URINE RATIO
Creatinine, Urine: 232.8 mg/dL
Microalb/Creat Ratio: 2764 mg/g creat — ABNORMAL HIGH (ref 0–29)
Microalbumin, Urine: 6433.8 ug/mL

## 2020-01-07 LAB — LIPID PANEL
Chol/HDL Ratio: 5.1 ratio — ABNORMAL HIGH (ref 0.0–4.4)
Cholesterol, Total: 266 mg/dL — ABNORMAL HIGH (ref 100–199)
HDL: 52 mg/dL (ref >39)
LDL Chol Calc (NIH): 185 mg/dL — ABNORMAL HIGH (ref 0–99)
Triglycerides: 159 mg/dL — ABNORMAL HIGH (ref 0–149)
VLDL Cholesterol Cal: 29 mg/dL (ref 5–40)

## 2020-01-07 LAB — TSH: TSH: 1.66 u[IU]/mL (ref 0.450–4.500)

## 2020-01-18 ENCOUNTER — Other Ambulatory Visit: Payer: Self-pay | Admitting: Family Medicine

## 2020-01-18 DIAGNOSIS — N1831 Chronic kidney disease, stage 3a: Secondary | ICD-10-CM

## 2020-01-18 DIAGNOSIS — R809 Proteinuria, unspecified: Secondary | ICD-10-CM

## 2020-01-18 MED ORDER — GLIMEPIRIDE 1 MG PO TABS: 1.0000 mg | ORAL_TABLET | Freq: Every day | ORAL | 1 refills | Status: DC

## 2020-01-18 MED ORDER — METFORMIN HCL 500 MG PO TABS: 500.0000 mg | ORAL_TABLET | Freq: Two times a day (BID) | ORAL | 0 refills | Status: DC

## 2020-01-18 MED ORDER — ROSUVASTATIN CALCIUM 10 MG PO TABS: 10.0000 mg | ORAL_TABLET | Freq: Every day | ORAL | 3 refills | Status: DC

## 2020-02-23 DIAGNOSIS — Z20828 Contact with and (suspected) exposure to other viral communicable diseases: Secondary | ICD-10-CM | POA: Diagnosis not present

## 2020-02-27 ENCOUNTER — Encounter (HOSPITAL_COMMUNITY): Payer: Self-pay

## 2020-02-27 ENCOUNTER — Other Ambulatory Visit: Payer: Self-pay

## 2020-02-27 ENCOUNTER — Inpatient Hospital Stay (HOSPITAL_COMMUNITY)
Admission: EM | Admit: 2020-02-27 | Discharge: 2020-03-02 | DRG: 291 | Disposition: A | Discharge status: Home Health | Payer: Medicare HMO | Attending: Internal Medicine | Admitting: Internal Medicine

## 2020-02-27 ENCOUNTER — Emergency Department (HOSPITAL_COMMUNITY): Payer: Medicare HMO

## 2020-02-27 DIAGNOSIS — Z8673 Personal history of transient ischemic attack (TIA), and cerebral infarction without residual deficits: Secondary | ICD-10-CM

## 2020-02-27 DIAGNOSIS — R0902 Hypoxemia: Secondary | ICD-10-CM | POA: Diagnosis not present

## 2020-02-27 DIAGNOSIS — Z91138 Patient's unintentional underdosing of medication regimen for other reason: Secondary | ICD-10-CM

## 2020-02-27 DIAGNOSIS — N1832 Chronic kidney disease, stage 3b: Secondary | ICD-10-CM | POA: Diagnosis not present

## 2020-02-27 DIAGNOSIS — Z6841 Body Mass Index (BMI) 40.0 and over, adult: Secondary | ICD-10-CM

## 2020-02-27 DIAGNOSIS — Z833 Family history of diabetes mellitus: Secondary | ICD-10-CM | POA: Diagnosis not present

## 2020-02-27 DIAGNOSIS — I130 Hypertensive heart and chronic kidney disease with heart failure and stage 1 through stage 4 chronic kidney disease, or unspecified chronic kidney disease: Principal | ICD-10-CM | POA: Diagnosis present

## 2020-02-27 DIAGNOSIS — E1151 Type 2 diabetes mellitus with diabetic peripheral angiopathy without gangrene: Secondary | ICD-10-CM | POA: Diagnosis present

## 2020-02-27 DIAGNOSIS — R0602 Shortness of breath: Secondary | ICD-10-CM | POA: Diagnosis not present

## 2020-02-27 DIAGNOSIS — Z91040 Latex allergy status: Secondary | ICD-10-CM

## 2020-02-27 DIAGNOSIS — J441 Chronic obstructive pulmonary disease with (acute) exacerbation: Secondary | ICD-10-CM | POA: Diagnosis not present

## 2020-02-27 DIAGNOSIS — Z8249 Family history of ischemic heart disease and other diseases of the circulatory system: Secondary | ICD-10-CM | POA: Diagnosis not present

## 2020-02-27 DIAGNOSIS — I5033 Acute on chronic diastolic (congestive) heart failure: Secondary | ICD-10-CM | POA: Diagnosis not present

## 2020-02-27 DIAGNOSIS — Z7984 Long term (current) use of oral hypoglycemic drugs: Secondary | ICD-10-CM

## 2020-02-27 DIAGNOSIS — R9431 Abnormal electrocardiogram [ECG] [EKG]: Secondary | ICD-10-CM | POA: Diagnosis present

## 2020-02-27 DIAGNOSIS — E785 Hyperlipidemia, unspecified: Secondary | ICD-10-CM | POA: Diagnosis present

## 2020-02-27 DIAGNOSIS — E669 Obesity, unspecified: Secondary | ICD-10-CM | POA: Diagnosis not present

## 2020-02-27 DIAGNOSIS — R778 Other specified abnormalities of plasma proteins: Secondary | ICD-10-CM | POA: Diagnosis not present

## 2020-02-27 DIAGNOSIS — T447X6A Underdosing of beta-adrenoreceptor antagonists, initial encounter: Secondary | ICD-10-CM | POA: Diagnosis present

## 2020-02-27 DIAGNOSIS — I161 Hypertensive emergency: Secondary | ICD-10-CM | POA: Diagnosis not present

## 2020-02-27 DIAGNOSIS — N1831 Chronic kidney disease, stage 3a: Secondary | ICD-10-CM | POA: Diagnosis not present

## 2020-02-27 DIAGNOSIS — E1122 Type 2 diabetes mellitus with diabetic chronic kidney disease: Secondary | ICD-10-CM | POA: Diagnosis not present

## 2020-02-27 DIAGNOSIS — N179 Acute kidney failure, unspecified: Secondary | ICD-10-CM | POA: Diagnosis present

## 2020-02-27 DIAGNOSIS — Z79899 Other long term (current) drug therapy: Secondary | ICD-10-CM | POA: Diagnosis not present

## 2020-02-27 DIAGNOSIS — M7989 Other specified soft tissue disorders: Secondary | ICD-10-CM | POA: Diagnosis not present

## 2020-02-27 DIAGNOSIS — I361 Nonrheumatic tricuspid (valve) insufficiency: Secondary | ICD-10-CM | POA: Diagnosis not present

## 2020-02-27 DIAGNOSIS — I351 Nonrheumatic aortic (valve) insufficiency: Secondary | ICD-10-CM | POA: Diagnosis not present

## 2020-02-27 DIAGNOSIS — E1169 Type 2 diabetes mellitus with other specified complication: Secondary | ICD-10-CM | POA: Diagnosis present

## 2020-02-27 DIAGNOSIS — Z20822 Contact with and (suspected) exposure to covid-19: Secondary | ICD-10-CM | POA: Diagnosis not present

## 2020-02-27 DIAGNOSIS — Z720 Tobacco use: Secondary | ICD-10-CM | POA: Diagnosis not present

## 2020-02-27 DIAGNOSIS — F17210 Nicotine dependence, cigarettes, uncomplicated: Secondary | ICD-10-CM | POA: Diagnosis present

## 2020-02-27 DIAGNOSIS — Z7982 Long term (current) use of aspirin: Secondary | ICD-10-CM

## 2020-02-27 DIAGNOSIS — I10 Essential (primary) hypertension: Secondary | ICD-10-CM | POA: Diagnosis not present

## 2020-02-27 DIAGNOSIS — J810 Acute pulmonary edema: Secondary | ICD-10-CM | POA: Diagnosis not present

## 2020-02-27 DIAGNOSIS — N183 Chronic kidney disease, stage 3 unspecified: Secondary | ICD-10-CM

## 2020-02-27 DIAGNOSIS — I248 Other forms of acute ischemic heart disease: Secondary | ICD-10-CM | POA: Diagnosis present

## 2020-02-27 DIAGNOSIS — M79609 Pain in unspecified limb: Secondary | ICD-10-CM | POA: Diagnosis not present

## 2020-02-27 DIAGNOSIS — Z888 Allergy status to other drugs, medicaments and biological substances status: Secondary | ICD-10-CM

## 2020-02-27 DIAGNOSIS — J45901 Unspecified asthma with (acute) exacerbation: Secondary | ICD-10-CM | POA: Diagnosis not present

## 2020-02-27 DIAGNOSIS — E1129 Type 2 diabetes mellitus with other diabetic kidney complication: Secondary | ICD-10-CM | POA: Diagnosis not present

## 2020-02-27 DIAGNOSIS — R600 Localized edema: Secondary | ICD-10-CM | POA: Diagnosis not present

## 2020-02-27 DIAGNOSIS — I160 Hypertensive urgency: Secondary | ICD-10-CM

## 2020-02-27 DIAGNOSIS — N184 Chronic kidney disease, stage 4 (severe): Secondary | ICD-10-CM

## 2020-02-27 LAB — CBC
HCT: 41.4 % (ref 36.0–46.0)
Hemoglobin: 12.4 g/dL (ref 12.0–15.0)
MCH: 28.8 pg (ref 26.0–34.0)
MCHC: 30 g/dL (ref 30.0–36.0)
MCV: 96.3 fL (ref 80.0–100.0)
Platelets: 270 10*3/uL (ref 150–400)
RBC: 4.3 MIL/uL (ref 3.87–5.11)
RDW: 14.9 % (ref 11.5–15.5)
WBC: 5.9 10*3/uL (ref 4.0–10.5)
nRBC: 0 % (ref 0.0–0.2)

## 2020-02-27 LAB — BASIC METABOLIC PANEL
Anion gap: 12 (ref 5–15)
BUN: 14 mg/dL (ref 8–23)
CO2: 24 mmol/L (ref 22–32)
Calcium: 8.6 mg/dL — ABNORMAL LOW (ref 8.9–10.3)
Chloride: 106 mmol/L (ref 98–111)
Creatinine, Ser: 1.44 mg/dL — ABNORMAL HIGH (ref 0.44–1.00)
GFR calc Af Amer: 43 mL/min — ABNORMAL LOW (ref >60)
GFR calc non Af Amer: 37 mL/min — ABNORMAL LOW (ref >60)
Glucose, Bld: 163 mg/dL — ABNORMAL HIGH (ref 70–99)
Potassium: 3.6 mmol/L (ref 3.5–5.1)
Sodium: 142 mmol/L (ref 135–145)

## 2020-02-27 LAB — POCT I-STAT 7, (LYTES, BLD GAS, ICA,H+H)
Acid-Base Excess: 2 mmol/L (ref 0.0–2.0)
Bicarbonate: 28.1 mmol/L — ABNORMAL HIGH (ref 20.0–28.0)
Calcium, Ion: 1.22 mmol/L (ref 1.15–1.40)
HCT: 37 % (ref 36.0–46.0)
Hemoglobin: 12.6 g/dL (ref 12.0–15.0)
O2 Saturation: 98 %
Patient temperature: 98.6
Potassium: 3.7 mmol/L (ref 3.5–5.1)
Sodium: 142 mmol/L (ref 135–145)
TCO2: 29 mmol/L (ref 22–32)
pCO2 arterial: 46.5 mmHg (ref 32.0–48.0)
pH, Arterial: 7.389 (ref 7.350–7.450)
pO2, Arterial: 111 mmHg — ABNORMAL HIGH (ref 83.0–108.0)

## 2020-02-27 LAB — URINALYSIS, ROUTINE W REFLEX MICROSCOPIC
Bilirubin Urine: NEGATIVE
Glucose, UA: 50 mg/dL — AB
Ketones, ur: NEGATIVE mg/dL
Leukocytes,Ua: NEGATIVE
Nitrite: NEGATIVE
Protein, ur: >=300 mg/dL — AB
Specific Gravity, Urine: 1.012 (ref 1.005–1.030)
pH: 6 (ref 5.0–8.0)

## 2020-02-27 LAB — SARS CORONAVIRUS 2 (TAT 6-24 HRS): SARS Coronavirus 2: NEGATIVE

## 2020-02-27 LAB — CBG MONITORING, ED
Glucose-Capillary: 142 mg/dL — ABNORMAL HIGH (ref 70–99)
Glucose-Capillary: 194 mg/dL — ABNORMAL HIGH (ref 70–99)

## 2020-02-27 LAB — TROPONIN I (HIGH SENSITIVITY): Troponin I (High Sensitivity): 28 ng/L — ABNORMAL HIGH (ref <18)

## 2020-02-27 LAB — POC SARS CORONAVIRUS 2 AG -  ED: SARS Coronavirus 2 Ag: NEGATIVE

## 2020-02-27 LAB — BRAIN NATRIURETIC PEPTIDE: B Natriuretic Peptide: 127.6 pg/mL — ABNORMAL HIGH (ref 0.0–100.0)

## 2020-02-27 MED ORDER — ALBUTEROL SULFATE (2.5 MG/3ML) 0.083% IN NEBU: 2.5000 mg | INHALATION_SOLUTION | RESPIRATORY_TRACT | Status: DC | PRN

## 2020-02-27 MED ORDER — BUDESONIDE 0.25 MG/2ML IN SUSP
0.2500 mg | Freq: Two times a day (BID) | RESPIRATORY_TRACT | Status: DC
  Administered 2020-02-28 – 2020-03-02 (×6): 0.25 mg via RESPIRATORY_TRACT
  Filled 2020-02-27 (×10): qty 2

## 2020-02-27 MED ORDER — IPRATROPIUM-ALBUTEROL 0.5-2.5 (3) MG/3ML IN SOLN
3.0000 mL | Freq: Four times a day (QID) | RESPIRATORY_TRACT | Status: DC
  Administered 2020-02-27 – 2020-02-28 (×2): 3 mL via RESPIRATORY_TRACT
  Filled 2020-02-27 (×2): qty 3

## 2020-02-27 MED ORDER — LABETALOL HCL 5 MG/ML IV SOLN
10.0000 mg | INTRAVENOUS | Status: DC | PRN
  Filled 2020-02-27: qty 4

## 2020-02-27 MED ORDER — INSULIN ASPART 100 UNIT/ML ~~LOC~~ SOLN
0.0000 [IU] | Freq: Three times a day (TID) | SUBCUTANEOUS | Status: DC
  Administered 2020-02-28: 1 [IU] via SUBCUTANEOUS
  Administered 2020-02-28: 3 [IU] via SUBCUTANEOUS
  Administered 2020-02-28 – 2020-02-29 (×2): 2 [IU] via SUBCUTANEOUS
  Administered 2020-02-29: 1 [IU] via SUBCUTANEOUS
  Administered 2020-02-29: 3 [IU] via SUBCUTANEOUS
  Administered 2020-03-01 (×2): 1 [IU] via SUBCUTANEOUS
  Administered 2020-03-01: 3 [IU] via SUBCUTANEOUS
  Administered 2020-03-02: 08:00:00 2 [IU] via SUBCUTANEOUS
  Administered 2020-03-02: 1 [IU] via SUBCUTANEOUS

## 2020-02-27 MED ORDER — ENOXAPARIN SODIUM 40 MG/0.4ML ~~LOC~~ SOLN
40.0000 mg | SUBCUTANEOUS | Status: DC
  Administered 2020-02-27 – 2020-03-01 (×4): 40 mg via SUBCUTANEOUS
  Filled 2020-02-27 (×4): qty 0.4

## 2020-02-27 MED ORDER — IPRATROPIUM BROMIDE 0.02 % IN SOLN: 0.5000 mg | RESPIRATORY_TRACT | Status: DC | PRN

## 2020-02-27 MED ORDER — MAGNESIUM SULFATE 2 GM/50ML IV SOLN
2.0000 g | Freq: Once | INTRAVENOUS | Status: AC
  Administered 2020-02-27: 2 g via INTRAVENOUS
  Filled 2020-02-27: qty 50

## 2020-02-27 MED ORDER — METHYLPREDNISOLONE SODIUM SUCC 125 MG IJ SOLR: 60.0000 mg | Freq: Four times a day (QID) | INTRAMUSCULAR | Status: DC

## 2020-02-27 MED ORDER — LABETALOL HCL 5 MG/ML IV SOLN
10.0000 mg | Freq: Once | INTRAVENOUS | Status: AC
  Administered 2020-02-27: 10 mg via INTRAVENOUS
  Filled 2020-02-27: qty 4

## 2020-02-27 MED ORDER — NICOTINE 14 MG/24HR TD PT24
14.0000 mg | MEDICATED_PATCH | Freq: Every day | TRANSDERMAL | Status: DC
  Administered 2020-02-28 – 2020-03-02 (×4): 14 mg via TRANSDERMAL
  Filled 2020-02-27 (×4): qty 1

## 2020-02-27 MED ORDER — ACETAMINOPHEN 325 MG PO TABS
650.0000 mg | ORAL_TABLET | Freq: Four times a day (QID) | ORAL | Status: DC | PRN
  Administered 2020-03-02: 650 mg via ORAL
  Filled 2020-02-27: qty 2

## 2020-02-27 MED ORDER — ENALAPRIL MALEATE 20 MG PO TABS
20.0000 mg | ORAL_TABLET | Freq: Once | ORAL | Status: AC
  Administered 2020-02-27: 20 mg via ORAL
  Filled 2020-02-27: qty 1

## 2020-02-27 MED ORDER — ALBUTEROL (5 MG/ML) CONTINUOUS INHALATION SOLN
10.0000 mg/h | INHALATION_SOLUTION | Freq: Once | RESPIRATORY_TRACT | Status: DC
  Filled 2020-02-27: qty 20

## 2020-02-27 MED ORDER — POTASSIUM CHLORIDE CRYS ER 20 MEQ PO TBCR
40.0000 meq | EXTENDED_RELEASE_TABLET | Freq: Once | ORAL | Status: AC
  Administered 2020-02-27: 40 meq via ORAL
  Filled 2020-02-27: qty 2

## 2020-02-27 MED ORDER — HYDRALAZINE HCL 20 MG/ML IJ SOLN
10.0000 mg | INTRAMUSCULAR | Status: DC | PRN
  Administered 2020-02-28 – 2020-03-02 (×2): 10 mg via INTRAVENOUS
  Filled 2020-02-27 (×2): qty 1

## 2020-02-27 MED ORDER — PANTOPRAZOLE SODIUM 40 MG PO TBEC
40.0000 mg | DELAYED_RELEASE_TABLET | Freq: Every day | ORAL | Status: DC
  Administered 2020-02-27 – 2020-03-02 (×5): 40 mg via ORAL
  Filled 2020-02-27 (×6): qty 1

## 2020-02-27 MED ORDER — INSULIN ASPART 100 UNIT/ML ~~LOC~~ SOLN: 0.0000 [IU] | Freq: Every day | SUBCUTANEOUS | Status: DC

## 2020-02-27 MED ORDER — FUROSEMIDE 10 MG/ML IJ SOLN
40.0000 mg | Freq: Once | INTRAMUSCULAR | Status: AC
  Administered 2020-02-27: 40 mg via INTRAVENOUS
  Filled 2020-02-27: qty 4

## 2020-02-27 MED ORDER — METHYLPREDNISOLONE SODIUM SUCC 125 MG IJ SOLR
125.0000 mg | Freq: Once | INTRAMUSCULAR | Status: AC
  Administered 2020-02-27: 125 mg via INTRAVENOUS
  Filled 2020-02-27: qty 2

## 2020-02-27 MED ORDER — NICARDIPINE HCL IN NACL 20-0.86 MG/200ML-% IV SOLN
3.0000 mg/h | INTRAVENOUS | Status: DC
  Administered 2020-02-27: 5 mg/h via INTRAVENOUS
  Administered 2020-02-28: 4 mg/h via INTRAVENOUS
  Administered 2020-02-28 (×2): 5 mg/h via INTRAVENOUS
  Filled 2020-02-27 (×7): qty 200

## 2020-02-27 MED ORDER — SODIUM CHLORIDE 0.9% FLUSH
3.0000 mL | Freq: Once | INTRAVENOUS | Status: AC
  Administered 2020-02-27: 3 mL via INTRAVENOUS

## 2020-02-27 MED ORDER — ACETAMINOPHEN 650 MG RE SUPP: 650.0000 mg | Freq: Four times a day (QID) | RECTAL | Status: DC | PRN

## 2020-02-27 MED ORDER — ALBUTEROL SULFATE HFA 108 (90 BASE) MCG/ACT IN AERS
2.0000 | INHALATION_SPRAY | RESPIRATORY_TRACT | Status: DC
  Administered 2020-02-27 (×2): 2 via RESPIRATORY_TRACT
  Filled 2020-02-27: qty 6.7

## 2020-02-27 MED ORDER — IPRATROPIUM BROMIDE 0.02 % IN SOLN
0.5000 mg | Freq: Once | RESPIRATORY_TRACT | Status: DC
  Filled 2020-02-27: qty 2.5

## 2020-02-27 MED ORDER — IPRATROPIUM BROMIDE HFA 17 MCG/ACT IN AERS
2.0000 | INHALATION_SPRAY | RESPIRATORY_TRACT | Status: DC
  Administered 2020-02-27 (×2): 2 via RESPIRATORY_TRACT
  Filled 2020-02-27: qty 12.9

## 2020-02-27 MED ORDER — NIFEDIPINE ER OSMOTIC RELEASE 60 MG PO TB24
60.0000 mg | ORAL_TABLET | Freq: Once | ORAL | Status: AC
  Administered 2020-02-27: 60 mg via ORAL
  Filled 2020-02-27: qty 1

## 2020-02-27 MED ORDER — POLYETHYLENE GLYCOL 3350 17 G PO PACK
17.0000 g | PACK | Freq: Every day | ORAL | Status: DC | PRN
  Administered 2020-02-28 – 2020-02-29 (×2): 17 g via ORAL
  Filled 2020-02-27 (×2): qty 1

## 2020-02-27 MED ORDER — ALBUTEROL SULFATE HFA 108 (90 BASE) MCG/ACT IN AERS: 2.0000 | INHALATION_SPRAY | RESPIRATORY_TRACT | Status: DC | PRN

## 2020-02-27 MED ORDER — DOCUSATE SODIUM 100 MG PO CAPS: 100.0000 mg | ORAL_CAPSULE | Freq: Two times a day (BID) | ORAL | Status: DC | PRN

## 2020-02-27 MED ORDER — NITROGLYCERIN IN D5W 200-5 MCG/ML-% IV SOLN: 0.0000 ug/min | INTRAVENOUS | Status: DC

## 2020-02-27 MED ORDER — HYDRALAZINE HCL 20 MG/ML IJ SOLN
10.0000 mg | Freq: Once | INTRAMUSCULAR | Status: AC
  Administered 2020-02-27: 10 mg via INTRAVENOUS
  Filled 2020-02-27: qty 1

## 2020-02-27 MED ORDER — ENALAPRIL MALEATE 20 MG PO TABS
20.0000 mg | ORAL_TABLET | Freq: Every day | ORAL | Status: DC
  Administered 2020-02-28: 20 mg via ORAL
  Filled 2020-02-27: qty 1

## 2020-02-27 MED ORDER — NIFEDIPINE ER OSMOTIC RELEASE 60 MG PO TB24
60.0000 mg | ORAL_TABLET | Freq: Every day | ORAL | Status: DC
  Administered 2020-02-28 – 2020-03-02 (×4): 60 mg via ORAL
  Filled 2020-02-27 (×4): qty 1

## 2020-02-27 NOTE — H&P (Signed)
History and Physical    Alicia [name]:[id number] DOB: Jan 13, 1951 DOA: 02/27/2020  PCP: Rutherford Guys, MD Patient coming from: Home  Chief Complaint: Shortness of breath  HPI: Alicia [name] is a 69 y.o. female with medical history significant of allergies, arthritis, chronic headaches, constipation, depression, non-insulin-dependent type 2 diabetes, hypertension, hyperlipidemia, obesity (BMI 79), PAD, CVA, CKD stage III, tobacco use presenting to the ED for evaluation of shortness of breath.  Patient reports 1 week history of progressively worsening dyspnea and wheezing.  She is coughing only a little.  Mostly having a lot of wheezing.  No fevers or chest pain.  States she had a Covid test yesterday which was negative.  Denies history of asthma or COPD.  She does not use any inhalers at home.  States she is not sure why her blood pressure is so high as she has been taking all of her home medications.  Reports taking enalapril but is not sure which other medications she takes.  ED Course: Wheezing on exam.  Blood pressure significantly elevated with systolic in the 710G and diastolic in the 269S on arrival.  Afebrile and no leukocytosis.  Creatinine 1.4, no significant change from baseline.  High-sensitivity troponin mildly elevated at 28.  BNP 127.  SARS-CoV-2 rapid antigen test negative, PCR test pending.  Patient initially required 5 L supplemental oxygen.  ABG with pH 7.38, PCO2 46, and PO2 111.   Chest x-ray showing cardiomegaly and small bilateral pleural effusions.  Patient was given albuterol and ipratropium inhaler treatments.  She was given Solu-Medrol 125 mg and IV magnesium 2 g.  For hypertensive urgency she was given enalapril, hydralazine, nifedipine, and labetalol.  After treatment she was weaned down to 2 L supplemental oxygen.  Review of Systems:  All systems reviewed and apart from history of presenting illness, are negative.  Past Medical History:  Diagnosis  Date  . Allergy   . Arthritis   . Chronic headaches   . Constipation   . Depression   . Diabetes mellitus   . Hyperlipidemia   . Hypertension   . Obesity   . PAD (peripheral artery disease) ([place])   . Shortness of breath   . Stroke [facility])     Past Surgical History:  Procedure Laterality Date  . ABDOMINAL AORTOGRAM W/LOWER EXTREMITY N/A 12/17/2018   Procedure: ABDOMINAL AORTOGRAM W/LOWER EXTREMITY;  Surgeon: Waynetta Sandy, MD;  Location: [place] [facility] LAB;  Service: Cardiovascular;  Laterality: N/A;  . FEMORAL BYPASS  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 Bridgett Larsson  . PERIPHERAL VASCULAR ATHERECTOMY Left 12/17/2018   Procedure: PERIPHERAL VASCULAR ATHERECTOMY;  Surgeon: Waynetta Sandy, MD;  Location: [place] [facility];  Service: Cardiovascular;  Laterality: Left;  . PERIPHERAL VASCULAR BALLOON ANGIOPLASTY Left 12/17/2018   Procedure: PERIPHERAL VASCULAR BALLOON ANGIOPLASTY;  Surgeon: Waynetta Sandy, MD;  Location: [place] [facility];  Service: Cardiovascular;  Laterality: Left;     reports that she has been smoking cigarettes. She has a 17.50 pack-year smoking history. She has never used smokeless tobacco. She reports current alcohol use of about 1.0 standard drinks of alcohol per week. She reports that she does not use drugs.  Allergies  Allergen Reactions  . Atorvastatin Swelling    Lip swelling  . Nifedipine Er Other (See Comments)    Caused nose bleeds in higher doses (90 mg., namely)  . Latex Rash    Family History  Problem  Relation Age of Onset  . Diabetes Father   . Heart disease Mother        NOT before age 24-  Bypass  . Hypertension Mother   . Hypertension Sister   . Varicose Veins Brother   . Heart disease Brother        Before age 34  . Hypertension Daughter   . Colon cancer Neg Hx   . Esophageal cancer Neg Hx   . Rectal cancer Neg Hx   . Stomach cancer Neg Hx     Prior to Admission  medications   Medication Sig Start Date End Date Taking? Authorizing Provider  acetaminophen (TYLENOL) 500 MG tablet Take 1,000 mg by mouth every 6 (six) hours as needed for moderate pain or headache.    Yes [provider]  aspirin 81 MG EC tablet Take 1 tablet (81 mg total) by mouth daily. 04/06/18  Yes Rutherford Guys, MD  enalapril (VASOTEC) 20 MG tablet Take 1 tablet (20 mg total) by mouth 2 (two) times daily. 01/06/20  Yes Rutherford Guys, MD  fluticasone [facility]) 50 MCG/ACT nasal spray Place 1 spray into both nostrils 2 (two) times daily. Patient taking differently: Place 1 spray into both nostrils 2 (two) times daily as needed for allergies or rhinitis.  06/21/18  Yes Rutherford Guys, MD  glimepiride (AMARYL) 1 MG tablet Take 1 tablet (1 mg total) by mouth daily with breakfast. 01/18/20  Yes Rutherford Guys, MD  metFORMIN (GLUCOPHAGE) 500 MG tablet Take 1 tablet (500 mg total) by mouth 2 (two) times daily with a meal. 01/18/20  Yes Rutherford Guys, MD  metoprolol succinate (TOPROL-XL) 50 MG 24 hr tablet Take 1 tablet (50 mg total) by mouth daily. 01/06/20  Yes Rutherford Guys, MD  NIFEdipine (ADALAT CC) 60 MG 24 hr tablet Take 1 tablet (60 mg total) by mouth daily. 01/06/20  Yes Rutherford Guys, MD  rosuvastatin (CRESTOR) 10 MG tablet Take 1 tablet (10 mg total) by mouth daily. 01/18/20  Yes Rutherford Guys, MD  spironolactone (ALDACTONE) 25 MG tablet Take 1 tablet (25 mg total) by mouth daily. 01/06/20  Yes Rutherford Guys, MD  bismuth-metronidazole-tetracycline [facility]) [phone] MG capsule Take 3 capsules by mouth 4 (four) times daily -  before meals and at bedtime for 10 days. 07/03/19 07/13/19  Thornton Park, MD  blood glucose meter kit and supplies per insurance preference. Check cbg once a day. Dx E11.9 01/06/20   Rutherford Guys, MD  pantoprazole (PROTONIX) 40 MG tablet Take 1 tablet (40 mg total) by mouth 2 (two) times daily for 10 days. Take 30-60 minutes before  breakfast 09/06/19 09/16/19  Levin Erp, PA  sitaGLIPtin (JANUVIA) 50 MG tablet Take 1 tablet (50 mg total) by mouth daily. Patient not taking: Reported on 02/27/2020 01/06/20   Rutherford Guys, MD    Physical Exam: Vitals:   02/27/20 1745 02/27/20 1830 02/27/20 2006 02/27/20 2045  BP: (!) 207/92 (!) 195/87 (!) 210/110   Pulse: 78 84    Resp: (!) 23 (!) 23    Temp:      TempSrc:      SpO2: 97% 92%  98%  Weight:      Height:        Physical Exam  Constitutional: She is oriented to person, place, and time. She appears well-developed and well-nourished. No distress.  HENT:  Head: Normocephalic.  Eyes: Right eye exhibits no discharge. Left eye exhibits no  discharge.  Cardiovascular: Normal rate, regular rhythm and intact distal pulses.  Pulmonary/Chest: She is in respiratory distress. She has wheezes. She has no rales.  Tachypneic Satting well on 2 L supplemental oxygen  Abdominal: Soft. Bowel sounds are normal. She exhibits no distension. There is no abdominal tenderness. There is no guarding.  Musculoskeletal:        General: Edema present.     Cervical back: Neck supple.     Comments: Left lower extremity appears edematous and larger in size compared to the contralateral extremity.  Neurological: She is alert and oriented to person, place, and time.  No focal motor or sensory deficit  Skin: Skin is warm and dry. She is not diaphoretic.     Labs on Admission: I have personally reviewed following labs and imaging studies  CBC: Recent Labs  Lab 02/27/20 1121 02/27/20 1650  WBC 5.9  --   HGB 12.4 12.6  HCT 41.4 37.0  MCV 96.3  --   PLT 270  --    Basic Metabolic Panel: Recent Labs  Lab 02/27/20 1121 02/27/20 1650  NA 142 142  K 3.6 3.7  CL 106  --   CO2 24  --   GLUCOSE 163*  --   BUN 14  --   CREATININE 1.44*  --   CALCIUM 8.6*  --    GFR: Estimated Creatinine Clearance: 48.3 mL/min (A) (by C-G formula based on SCr of 1.44 mg/dL (H)). Liver  Function Tests: No results for input(s): AST, ALT, ALKPHOS, BILITOT, PROT, ALBUMIN in the last 168 hours. No results for input(s): LIPASE, AMYLASE in the last 168 hours. No results for input(s): AMMONIA in the last 168 hours. Coagulation Profile: No results for input(s): INR, PROTIME in the last 168 hours. Cardiac Enzymes: No results for input(s): CKTOTAL, CKMB, CKMBINDEX, TROPONINI in the last 168 hours. BNP (last 3 results) No results for input(s): PROBNP in the last 8760 hours. HbA1C: No results for input(s): HGBA1C in the last 72 hours. CBG: Recent Labs  Lab 02/27/20 1439  GLUCAP 142*   Lipid Profile: No results for input(s): CHOL, HDL, LDLCALC, TRIG, CHOLHDL, LDLDIRECT in the last 72 hours. Thyroid Function Tests: No results for input(s): TSH, T4TOTAL, FREET4, T3FREE, THYROIDAB in the last 72 hours. Anemia Panel: No results for input(s): VITAMINB12, FOLATE, FERRITIN, TIBC, IRON, RETICCTPCT in the last 72 hours. Urine analysis:    Component Value Date/Time   COLORURINE YELLOW 02/27/2020 1913   APPEARANCEUR CLEAR 02/27/2020 1913   LABSPEC 1.012 02/27/2020 1913   PHURINE 6.0 02/27/2020 1913   GLUCOSEU 50 (A) 02/27/2020 1913   HGBUR SMALL (A) 02/27/2020 [place] 02/27/2020 [place] 02/27/2020 1913   PROTEINUR >=300 (A) 02/27/2020 1913   UROBILINOGEN 4.0 (H) 07/29/2013 1231   NITRITE NEGATIVE 02/27/2020 1913   LEUKOCYTESUR NEGATIVE 02/27/2020 1913    Radiological Exams on Admission: DG Chest 2 View  Result Date: 02/27/2020 CLINICAL DATA:  Shortness of breath EXAM: CHEST - 2 VIEW COMPARISON:  06/14/2018 FINDINGS: Cardiomegaly. Small bilateral pleural effusions. Disc degenerative disease of the thoracic spine. Chronic fracture deformity proximal right humerus. IMPRESSION: Cardiomegaly and small bilateral pleural effusions. Electronically Signed   By: Eddie Candle M.D.   On: 02/27/2020 14:50    EKG: Independently reviewed.  Sinus rhythm  with occasional PVCs.  QTc 505.  Assessment/Plan Principal Problem:   Exacerbation of RAD (reactive airway disease) Active Problems:   Diabetes mellitus type 2 in obese ([place])  Hypertensive urgency   CKD (chronic kidney disease) stage 3, GFR 30-59 ml/min   Tobacco use   Acute exacerbation of reactive airway disease: No documented history of asthma or COPD.  Does have a longstanding history of cigarette smoking.  Wheezing on exam.  Currently requiring 2 L supplemental oxygen. -Plan: Continue Solu-Medrol 60 mg every 6 hours, duo nebs every 6 hours, albuterol nebulizer as needed, and Pulmicort nebulizer twice daily.  Will hold antibiotics as no fever or leukocytosis.  No signs of infection on chest x-ray.  Check procalcitonin level.  Patient will need outpatient PFTs.  Hypertensive urgency: Blood pressure significantly elevated with systolic in the 101B and diastolic in the 510C on arrival.  Patient received enalapril, hydralazine, nifedipine, and labetalol.  Blood pressure continues to be elevated with systolic above 585.  I feel the patient will need ICU admission for a continuous IV infusion such as nicardipine or labetalol to bring down her blood pressures.  Discussed the case with Dr. Lucile Shutters, critical care team will evaluate the patient. Continue IV hydralazine as needed and IV labetalol as needed for now.  Goal blood pressure around 160/100 in the next 24 hours, then gradually reduce further.   CKD stage III: Creatinine 1.4, no significant change from baseline.  Tobacco use: NicoDerm patch and counseling  Non-insulin-dependent type 2 diabetes: Last A1c 7.3 on 01/06/2020.  Order sliding scale insulin sensitive ACHS and CBG checks.  Demand ischemia: In the setting of hypertensive urgency.  Troponin mildly elevated and EKG without acute ischemic changes.  Patient is not complaining of chest pain.  Plan is to continue cardiac monitoring and trend troponin.  QT prolongation on EKG: Continue  cardiac monitoring.  Keep potassium above 4 and magnesium above 2.  Repeat EKG in a.m. Avoid QT prolonging drugs if possible.  Left lower extremity edema: Doppler ordered to rule out DVT.  DVT prophylaxis: Lovenox Code Status: Full code Family Communication: No family available at this time. Disposition Plan: Anticipate discharge after improvement of respiratory status and blood pressure. Consults called: Critical care Admission status: It is my clinical opinion that admission to INPATIENT is reasonable and necessary because of the expectation that this patient will require hospital care that crosses at least 2 midnights to treat this condition based on the medical complexity of the problems presented.  Given the aforementioned information, the predictability of an adverse outcome is felt to be significant.  The medical decision making on this patient was of high complexity and the patient is at high risk for clinical deterioration, therefore this is a level 3 visit.  Shela Leff MD [facility]  If 7PM-7AM, please contact night-coverage [email or web address]  02/27/2020, 8:48 PM

## 2020-02-27 NOTE — Consult Note (Signed)
NAME:  Alicia [name], MRN:  [id number], DOB:  [birth year], LOS: 0 ADMISSION DATE:  02/27/2020, CONSULTATION DATE:  02/27/20 REFERRING MD:  Dr. Marlowe Sax, CHIEF COMPLAINT:  Shortness of b reath   Brief History   Hypertensive Emergency  History of present illness   69 y.o. female with medical history significant of allergies, arthritis, chronic headaches, constipation, depression, non-insulin-dependent type 2 diabetes, hypertension, hyperlipidemia, obesity (BMI 30), PAD, CVA, CKD stage III, tobacco use presenting to the ED for evaluation of shortness of breath. Patient initially admitted to hospitalist service, however hypertensive persistence despite enalapril, hydralazine, nifedipine and labetalol, last dose of labetalol at 6pm.   Past Medical History   Past Medical History:  Diagnosis Date  . Allergy   . Arthritis   . Chronic headaches   . Constipation   . Depression   . Diabetes mellitus   . Hyperlipidemia   . Hypertension   . Obesity   . PAD (peripheral artery disease) ([place])   . Shortness of breath   . Stroke [facility])      Significant Hospital Events     Consults:  None  Procedures:  None  Significant Diagnostic Tests:  Troponin neg x 1 EKG non ischemic   Micro Data:  none  Antimicrobials:  none   Interim history/subjective:  Patient endorses that she feels terrible, generalized malaise with some substernal chest pain, 2/10, feels like gas, no radiation, no stabbing chest pain, has internally been stable over the last week. Denies back pain, tearing sensation. Endorses headaches, and visual changes but no visual loss, no black spots, no focal neurological deficits. Denies neck pain, denies fevers or chills, endorses 1x week fo runny nose.   Objective   Blood pressure (!) 210/110, pulse 84, temperature 98.7 F (37.1 C), temperature source Oral, resp. rate (!) 23, height 5' 7" (1.702 m), weight 114.8 kg, SpO2 98 %.       No intake or output data in the 24 hours  ending 02/27/20 2203 Filed Weights   02/27/20 1059  Weight: 114.8 kg    Examination: General: mild distress w gripping side rails, inability to lay flat HENT: injected sclera, EOMI, PERRL,  Lungs: basilar rales no wheezing slightly tachypneic Cardiovascular: bounding pulses, II/VI systolic murmur at LUSB without radiation Abdomen: soft, obese NTND Extremities: LLE edema w TTP no cords, pulses 2+ bilatearlly, pitting edema both sides but L > R Neuro: AOx4 no focal deficits moving all extremities, visual fields grossly intact GU: declined  Resolved Hospital Problem list   None   Assessment & Plan:  69 y.o. female with medical history significant of allergies, arthritis, chronic headaches, constipation, depression, non-insulin-dependent type 2 diabetes, hypertension, hyperlipidemia, obesity (BMI 39), PAD, CVA, CKD stage III, tobacco use presenting to the ED for evaluation of shortness of breath found ot have hypertensive emergency, concern for decompensated heart failure.   Hypertensive Emergency- -cont enalapril -cont asa -cont nifeidpine XL -hold aldactone -90m IV lasix now -strict I/O -start cardene gtt, goal systolic BP [id number]-heart health/sodium diet -ECHO -trend troponins -send UDS, TSH -consider CTA if back pain or persistent hypertension despite gtt for dissection, low suspicino given slow onset of symptoms and no mediastinal widening on  CXR  Hx fo COPD -d/c methylpred 60q6h -pulmicort BID -brovana BID -duonebs q2h PRN -albuterol q2h PRN -no abx for now   Hx of IDDM -cont current insulin regimen, monitor for hyperglycemia  Best practice:  Diet: Heart Health Low Sodium Controlled Carb Pain/Anxiety/Delirium protocol (if indicated):  None VAP protocol (if indicated): None DVT prophylaxis: Lovenox GI prophylaxis: Pantoprazole Glucose control: As above Mobility: Bedrest for now Code Status: Full Code3 Family Communication: Spoke with Daughter  LNOK Disposition: Admit to ICU pending BP controlw with resolution of symptoms  Labs   CBC: Recent Labs  Lab 02/27/20 1121 02/27/20 1650  WBC 5.9  --   HGB 12.4 12.6  HCT 41.4 37.0  MCV 96.3  --   PLT 270  --     Basic Metabolic Panel: Recent Labs  Lab 02/27/20 1121 02/27/20 1650  NA 142 142  K 3.6 3.7  CL 106  --   CO2 24  --   GLUCOSE 163*  --   BUN 14  --   CREATININE 1.44*  --   CALCIUM 8.6*  --    GFR: Estimated Creatinine Clearance: 48.3 mL/min (A) (by C-G formula based on SCr of 1.44 mg/dL (H)). Recent Labs  Lab 02/27/20 1121  WBC 5.9    Liver Function Tests: No results for input(s): AST, ALT, ALKPHOS, BILITOT, PROT, ALBUMIN in the last 168 hours. No results for input(s): LIPASE, AMYLASE in the last 168 hours. No results for input(s): AMMONIA in the last 168 hours.  ABG    Component Value Date/Time   PHART 7.389 02/27/2020 1650   PCO2ART 46.5 02/27/2020 1650   PO2ART 111.0 (H) 02/27/2020 1650   HCO3 28.1 (H) 02/27/2020 1650   TCO2 29 02/27/2020 1650   O2SAT 98.0 02/27/2020 1650     Coagulation Profile: No results for input(s): INR, PROTIME in the last 168 hours.  Cardiac Enzymes: No results for input(s): CKTOTAL, CKMB, CKMBINDEX, TROPONINI in the last 168 hours.  HbA1C: Hemoglobin A1C  Date/Time Value Ref Range Status  01/06/2020 09:57 AM 7.3 (A) 4.0 - 5.6 % Final  02/07/2019 11:41 AM 7.5 (A) 4.0 - 5.6 % Final   Hgb A1c MFr Bld  Date/Time Value Ref Range Status  10/09/2018 12:59 PM 7.1 (H) 4.8 - 5.6 % Final    Comment:             Prediabetes: 5.7 - 6.4          Diabetes: >6.4          Glycemic control for adults with diabetes: <7.0   06/21/2018 12:32 PM 7.8 (H) 4.8 - 5.6 % Final    Comment:             Prediabetes: 5.7 - 6.4          Diabetes: >6.4          Glycemic control for adults with diabetes: <7.0     CBG: Recent Labs  Lab 02/27/20 1439 02/27/20 2142  GLUCAP 142* 194*    Review of Systems:   Review of Systems   Constitutional: Positive for malaise/fatigue. Negative for chills, diaphoresis and fever.  HENT: Positive for congestion. Negative for nosebleeds, sinus pain and sore throat.   Eyes: Positive for blurred vision and redness. Negative for double vision, photophobia and pain.  Respiratory: Positive for cough and shortness of breath. Negative for hemoptysis, sputum production, wheezing and stridor.   Cardiovascular: Positive for chest pain, orthopnea, claudication and leg swelling. Negative for palpitations and PND.  Gastrointestinal: Positive for heartburn. Negative for abdominal pain, constipation, diarrhea, nausea and vomiting.  Genitourinary: Negative for dysuria and urgency.  Musculoskeletal: Negative for back pain, joint pain, myalgias and neck pain.  Skin: Negative for itching and rash.  Neurological: Positive for headaches. Negative for dizziness,  sensory change, speech change, focal weakness and weakness.  Endo/Heme/Allergies: Negative for environmental allergies. Does not bruise/bleed easily.  Psychiatric/Behavioral: Negative for depression and suicidal ideas.    Past Medical History  She,  has a past medical history of Allergy, Arthritis, Chronic headaches, Constipation, Depression, Diabetes mellitus, Hyperlipidemia, Hypertension, Obesity, PAD (peripheral artery disease) ([place]), Shortness of breath, and Stroke ([place]).   Surgical History    Past Surgical History:  Procedure Laterality Date  . ABDOMINAL AORTOGRAM W/LOWER EXTREMITY N/A 12/17/2018   Procedure: ABDOMINAL AORTOGRAM W/LOWER EXTREMITY;  Surgeon: Waynetta Sandy, MD;  Location: [facility];  Service: Cardiovascular;  Laterality: N/A;  . FEMORAL BYPASS  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 Bridgett Larsson  . PERIPHERAL VASCULAR ATHERECTOMY Left 12/17/2018   Procedure: PERIPHERAL VASCULAR ATHERECTOMY;  Surgeon: Waynetta Sandy, MD;  Location: [facility];  Service:  Cardiovascular;  Laterality: Left;  . PERIPHERAL VASCULAR BALLOON ANGIOPLASTY Left 12/17/2018   Procedure: PERIPHERAL VASCULAR BALLOON ANGIOPLASTY;  Surgeon: Waynetta Sandy, MD;  Location: [facility];  Service: Cardiovascular;  Laterality: Left;     Social History   reports that she has been smoking cigarettes. She has a 17.50 pack-year smoking history. She has never used smokeless tobacco. She reports current alcohol use of about 1.0 standard drinks of alcohol per week. She reports that she does not use drugs.   Family History   Her family history includes Diabetes in her father; Heart disease in her brother and mother; Hypertension in her daughter, mother, and sister; Varicose Veins in her brother. There is no history of Colon cancer, Esophageal cancer, Rectal cancer, or Stomach cancer.   Allergies Allergies  Allergen Reactions  . Atorvastatin Swelling    Lip swelling  . Nifedipine Er Other (See Comments)    Caused nose bleeds in higher doses (90 mg., namely)  . Latex Rash     Home Medications  Prior to Admission medications   Medication Sig Start Date End Date Taking? Authorizing Provider  acetaminophen (TYLENOL) 500 MG tablet Take 1,000 mg by mouth every 6 (six) hours as needed for moderate pain or headache.    Yes [provider]  aspirin 81 MG EC tablet Take 1 tablet (81 mg total) by mouth daily. 04/06/18  Yes Rutherford Guys, MD  enalapril (VASOTEC) 20 MG tablet Take 1 tablet (20 mg total) by mouth 2 (two) times daily. 01/06/20  Yes Rutherford Guys, MD  fluticasone [facility]) 50 MCG/ACT nasal spray Place 1 spray into both nostrils 2 (two) times daily. Patient taking differently: Place 1 spray into both nostrils 2 (two) times daily as needed for allergies or rhinitis.  06/21/18  Yes Rutherford Guys, MD  glimepiride (AMARYL) 1 MG tablet Take 1 tablet (1 mg total) by mouth daily with breakfast. 01/18/20  Yes Rutherford Guys, MD  metFORMIN (GLUCOPHAGE) 500 MG  tablet Take 1 tablet (500 mg total) by mouth 2 (two) times daily with a meal. 01/18/20  Yes Rutherford Guys, MD  metoprolol succinate (TOPROL-XL) 50 MG 24 hr tablet Take 1 tablet (50 mg total) by mouth daily. 01/06/20  Yes Rutherford Guys, MD  NIFEdipine (ADALAT CC) 60 MG 24 hr tablet Take 1 tablet (60 mg total) by mouth daily. 01/06/20  Yes Rutherford Guys, MD  rosuvastatin (CRESTOR) 10 MG tablet Take 1 tablet (10 mg total) by mouth daily. 01/18/20  Yes Rutherford Guys, MD  spironolactone (  ALDACTONE) 25 MG tablet Take 1 tablet (25 mg total) by mouth daily. 01/06/20  Yes Rutherford Guys, MD  bismuth-metronidazole-tetracycline [facility]) [phone] MG capsule Take 3 capsules by mouth 4 (four) times daily -  before meals and at bedtime for 10 days. 07/03/19 07/13/19  Thornton Park, MD  blood glucose meter kit and supplies per insurance preference. Check cbg once a day. Dx E11.9 01/06/20   Rutherford Guys, MD  pantoprazole (PROTONIX) 40 MG tablet Take 1 tablet (40 mg total) by mouth 2 (two) times daily for 10 days. Take 30-60 minutes before breakfast 09/06/19 09/16/19  Levin Erp, PA  sitaGLIPtin (JANUVIA) 50 MG tablet Take 1 tablet (50 mg total) by mouth daily. Patient not taking: Reported on 02/27/2020 01/06/20   Rutherford Guys, MD     Critical care time: This patient is critically ill with multiple organ system failure; which, requires frequent high complexity decision making, assessment, support, evaluation, and titration of therapies. This was completed through the application of advanced monitoring technologies and extensive interpretation of multiple databases. During this encounter critical care time was devoted to patient care services described in this note for 57 minutes.

## 2020-02-27 NOTE — ED Triage Notes (Addendum)
Patient complains of increased SOB and dizziness for 2-3 days, had covid negative yesterday. Patient reports that she has missed a few doses intermittently of her BP meds

## 2020-02-27 NOTE — ED Provider Notes (Signed)
  Physical Exam  BP (!) 207/92   Pulse 78   Temp 98.7 F (37.1 C) (Oral)   Resp (!) 23   Ht 5\' 7"  (1.702 m)   Wt 114.8 kg   SpO2 97%   BMI 39.63 kg/m   Physical Exam  ED Course/Procedures     Procedures  MDM  Patient care assumed at 3:30 PM.  Patient is here with shortness of breath.  Patient has some audible wheezing on initial exam and is hypertensive.  Patient has a history of hypertension did not take her meds.  Signout pending recheck blood pressure after BP meds as well as troponin and reassessment.  5 pm Patient is still wheezing despite getting albuterol and Solu-Medrol.  Her blood pressure is still over 200.  Troponin is mildly elevated to 28 likely from demand ischemia.  Will give magnesium and get ABG and reassess.  6:50 PM ABG reassuring per patient's oxygen is around 90% on room air.  I asked nurse to ambulate patient but patient is unable to ambulate since she is so short of breath.  At this point, will admit for hypertensive urgency as well as COPD exacerbation.    Drenda Freeze, MD 02/27/20 2153

## 2020-02-27 NOTE — Progress Notes (Addendum)
RT called for continuous albuterol treatment per RN for SOB, congestion and PNA. Pt had a negative covid test at another facility 24hrs ago, RT requested POC test due to protocol for the administration of NEBs. RT assessed pt. Pt is in no apparent respiratory distress on room air with SATs maintaining >93%, wheezing is noted. HR is currently 89, RR 24. Pt is waiting to be treated for being hypertensive with a BP of 220/97. Chest xray is negative for PNA or consolidation, small bilateral pleural effusions are reported.  Respiratory spoke with ED physician and MD is okay with the trial of Albuterol and Atrovent HFA's first. RT will continue to monitor.

## 2020-02-27 NOTE — ED Notes (Signed)
Went to Ambulate Pt but Pt was short of breath in bed and O2 was 90% on 2L Sitting up in bed. Pt stated she did not feel she could walk at this time. Notified RN to go check on Pt b/c of her SOB.

## 2020-02-27 NOTE — ED Notes (Signed)
Pt transported to xray 

## 2020-02-27 NOTE — ED Provider Notes (Signed)
[facility] EMERGENCY DEPARTMENT Provider Note   CSN: [id number] Arrival date & time: 02/27/20  1050     History No chief complaint on file.   Alicia [name] is a 69 y.o. female.  Presented to ER with complaint of shortness of breath.  Patient has had cough, chest congestion over the past few days.  Got tested for Covid at outside facility and was reportedly negative.  No fever, cough mostly nonproductive.  Past medical history diabetes, high blood pressure, smoking.  Denies known history of COPD. HPI     Past Medical History:  Diagnosis Date  . Allergy   . Arthritis   . Chronic headaches   . Constipation   . Depression   . Diabetes mellitus   . Hyperlipidemia   . Hypertension   . Obesity   . PAD (peripheral artery disease) ([place])   . Shortness of breath   . Stroke [facility])     Patient Active Problem List   Diagnosis Date Noted  . Prolonged Q-T interval on ECG 04/27/2018  . CHF (congestive heart failure) ([place]) 04/27/2018  . Weakness 04/03/2018  . Syncope 04/03/2018  . Renal insufficiency 03/16/2018  . Diabetes mellitus type 2 in obese ([place]) 03/16/2018  . Morbid obesity ([place])   . CVA (cerebral vascular accident) ([place]) 03/15/2018  . Numbness in both hands- and Feet 06/06/2014  . Weakness of both legs 06/06/2014  . Swelling of limb-Left Leg > Right 06/06/2014  . Peripheral vascular disease, unspecified ([place]) 11/30/2012  . Pain in limb 11/30/2012  . Atherosclerosis of native arteries of the extremities with intermittent claudication 04/06/2012  . CHEST PAIN 01/03/2011  . Hyperlipidemia 07/20/2007  . Essential hypertension, benign 07/20/2007  . ARTHRITIS 07/20/2007    Past Surgical History:  Procedure Laterality Date  . ABDOMINAL AORTOGRAM W/LOWER EXTREMITY N/A 12/17/2018   Procedure: ABDOMINAL AORTOGRAM W/LOWER EXTREMITY;  Surgeon: Waynetta Sandy, MD;  Location: [facility] CV [facility];  Service: Cardiovascular;  Laterality: N/A;  .  FEMORAL BYPASS  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 Bridgett Larsson  . PERIPHERAL VASCULAR ATHERECTOMY Left 12/17/2018   Procedure: PERIPHERAL VASCULAR ATHERECTOMY;  Surgeon: Waynetta Sandy, MD;  Location: [facility];  Service: Cardiovascular;  Laterality: Left;  . PERIPHERAL VASCULAR BALLOON ANGIOPLASTY Left 12/17/2018   Procedure: PERIPHERAL VASCULAR BALLOON ANGIOPLASTY;  Surgeon: Waynetta Sandy, MD;  Location: [facility];  Service: Cardiovascular;  Laterality: Left;     OB History   No obstetric history on file.     Family History  Problem Relation Age of Onset  . Diabetes Father   . Heart disease Mother        NOT before age 55-  Bypass  . Hypertension Mother   . Hypertension Sister   . Varicose Veins Brother   . Heart disease Brother        Before age 40  . Hypertension Daughter   . Colon cancer Neg Hx   . Esophageal cancer Neg Hx   . Rectal cancer Neg Hx   . Stomach cancer Neg Hx     Social History   Tobacco Use  . Smoking status: Light Tobacco Smoker    Packs/day: 0.50    Years: 35.00    Pack years: 17.50    Types: Cigarettes  . Smokeless tobacco: Never Used  . Tobacco comment: patient is trying to quit and has gone down to half a pack per day  Substance Use Topics  . Alcohol use: Yes    Alcohol/week: 1.0 standard drinks    Types: 1 Standard drinks or equivalent per week    Comment: occasional use only  . Drug use: No    Home Medications Prior to Admission medications   Medication Sig Start Date End Date Taking? Authorizing Provider  acetaminophen (TYLENOL) 500 MG tablet Take 1,000 mg by mouth every 6 (six) hours as needed for moderate pain or headache.     [provider]  aspirin 81 MG EC tablet Take 1 tablet (81 mg total) by mouth daily. 04/06/18   Rutherford Guys, MD  bismuth-metronidazole-tetracycline [facility]) [phone] MG capsule Take 3 capsules by mouth 4 (four) times daily -   before meals and at bedtime for 10 days. 07/03/19 07/13/19  Th[place], MD  blood glucose meter kit and supplies per insurance preference. Check cbg once a day. Dx E11.9 01/06/20   Rutherford Guys, MD  enalapril (VASOTEC) 20 MG tablet Take 1 tablet (20 mg total) by mouth 2 (two) times daily. 01/06/20   Rutherford Guys, MD  fluticasone (FLONASE) 50 MCG/ACT nasal spray Place 1 spray into both nostrils 2 (two) times daily. Patient taking differently: Place 1 spray into both nostrils 2 (two) times daily as needed for allergies or rhinitis.  06/21/18   Rutherford Guys, MD  glimepiride (AMARYL) 1 MG tablet Take 1 tablet (1 mg total) by mouth daily with breakfast. 01/18/20   Rutherford Guys, MD  metFORMIN (GLUCOPHAGE) 500 MG tablet Take 1 tablet (500 mg total) by mouth 2 (two) times daily with a meal. 01/18/20   Rutherford Guys, MD  metoprolol succinate (TOPROL-XL) 50 MG 24 hr tablet Take 1 tablet (50 mg total) by mouth daily. 01/06/20   Rutherford Guys, MD  NIFEdipine (ADALAT CC) 60 MG 24 hr tablet Take 1 tablet (60 mg total) by mouth daily. 01/06/20   Rutherford Guys, MD  pantoprazole (PROTONIX) 40 MG tablet Take 1 tablet (40 mg total) by mouth 2 (two) times daily for 10 days. Take 30-60 minutes before breakfast 09/06/19 09/16/19  Levin Erp, PA  rosuvastatin (CRESTOR) 10 MG tablet Take 1 tablet (10 mg total) by mouth daily. 01/18/20   Rutherford Guys, MD  sitaGLIPtin (JANUVIA) 50 MG tablet Take 1 tablet (50 mg total) by mouth daily. 01/06/20   Rutherford Guys, MD  spironolactone (ALDACTONE) 25 MG tablet Take 1 tablet (25 mg total) by mouth daily. 01/06/20   Rutherford Guys, MD    Allergies    Atorvastatin, Nifedipine er, and Latex  Review of Systems   Review of Systems  Constitutional: Negative for chills and fever.  HENT: Negative for ear pain and sore throat.   Eyes: Negative for pain and visual disturbance.  Respiratory: Positive for cough and shortness of breath.    Cardiovascular: Negative for chest pain and palpitations.  Gastrointestinal: Negative for abdominal pain and vomiting.  Genitourinary: Negative for dysuria and hematuria.  Musculoskeletal: Negative for arthralgias and back pain.  Skin: Negative for color change and rash.  Neurological: Negative for seizures and syncope.  All other systems reviewed and are negative.   Physical Exam Updated Vital Signs BP (!) 212/91   Pulse 87   Temp 98.7 F (37.1 C) (Oral)   Resp 17   Ht 5' 7"  (1.702 m)   Wt 114.8 kg   SpO2 93%   BMI 39.63 kg/m   Physical Exam Vitals and nursing  note reviewed.  Constitutional:      General: She is not in acute distress.    Appearance: She is well-developed.  HENT:     Head: Normocephalic and atraumatic.  Eyes:     Conjunctiva/sclera: Conjunctivae normal.  Cardiovascular:     Rate and Rhythm: Normal rate and regular rhythm.     Heart sounds: No murmur.  Pulmonary:     Comments: Mild tachypnea, no accessory muscle use, bilateral end expiratory wheeze noted Abdominal:     Palpations: Abdomen is soft.     Tenderness: There is no abdominal tenderness.  Musculoskeletal:     Cervical back: Neck supple.  Skin:    General: Skin is warm and dry.  Neurological:     General: No focal deficit present.     Mental Status: She is alert and oriented to person, place, and time.     ED Results / Procedures / Treatments   Labs (all labs ordered are listed, but only abnormal results are displayed) Labs Reviewed  BASIC METABOLIC PANEL - Abnormal; Notable for the following components:      Result Value   Glucose, Bld 163 (*)    Creatinine, Ser 1.44 (*)    Calcium 8.6 (*)    GFR calc non Af Amer 37 (*)    GFR calc Af Amer 43 (*)    All other components within normal limits  CBG MONITORING, ED - Abnormal; Notable for the following components:   Glucose-Capillary 142 (*)    All other components within normal limits  CBC  URINALYSIS, ROUTINE W REFLEX  MICROSCOPIC  BRAIN NATRIURETIC PEPTIDE  POC SARS CORONAVIRUS 2 AG -  ED  TROPONIN I (HIGH SENSITIVITY)    EKG EKG Interpretation  Date/Time:  Thursday February 27 2020 10:55:56 EDT Ventricular Rate:  96 PR Interval:  142 QRS Duration: 98 QT Interval:  400 QTC Calculation: 505 R Axis:   -37 Text Interpretation: Sinus rhythm with occasional Premature ventricular complexes Right atrial enlargement Left axis deviation Moderate voltage criteria for LVH, may be normal variant ( R in aVL , Cornell product ) Cannot rule out Anterior infarct , age undetermined Abnormal ECG No significant change since last tracing Confirmed by Wandra Arthurs [phone]) on 02/27/2020 3:09:27 PM   Radiology DG Chest 2 View  Result Date: 02/27/2020 CLINICAL DATA:  Shortness of breath EXAM: CHEST - 2 VIEW COMPARISON:  06/14/2018 FINDINGS: Cardiomegaly. Small bilateral pleural effusions. Disc degenerative disease of the thoracic spine. Chronic fracture deformity proximal right humerus. IMPRESSION: Cardiomegaly and small bilateral pleural effusions. Electronically Signed   By: Eddie Candle M.D.   On: 02/27/2020 14:50    Procedures Procedures (including critical care time)  Medications Ordered in ED Medications  albuterol (VENTOLIN HFA) 108 (90 Base) MCG/ACT inhaler 2 puff (has no administration in time range)  ipratropium (ATROVENT HFA) inhaler 2 puff (has no administration in time range)  albuterol (VENTOLIN HFA) 108 (90 Base) MCG/ACT inhaler 2 puff (has no administration in time range)  sodium chloride flush (NS) 0.9 % injection 3 mL (3 mLs Intravenous Given 02/27/20 1533)  enalapril (VASOTEC) tablet 20 mg (20 mg Oral Given 02/27/20 1532)  NIFEdipine (PROCARDIA XL/NIFEDICAL XL) 24 hr tablet 60 mg (60 mg Oral Given 02/27/20 1533)    ED Course  I have reviewed the triage vital signs and the nursing notes.  Pertinent labs & imaging results that were available during my care of the patient were reviewed by me and considered in  my  medical decision making (see chart for details).    MDM Rules/Calculators/A&P                      69 year old lady presenting to ER with complaint of shortness of breath.  On exam patient noted to have stable vital signs except for hypertension, and expiratory wheeze.  CXR negative for pneumonia or obvious fluid overload.  Did demonstrate cardiomegaly and small pleural effusions.  Suspect most likely etiology COPD exacerbation.  Will also check troponin, BNP to assess for heart failure, rule out ACS though have lower suspicion.  Placed orders for home BP meds, albuterol, ipratropium treatments.  Signed out to Dr. Darl Householder pending treatments, reassessment.  Anticipate likely can be managed outpatient for suspected COPD exacerbation.  Final Clinical Impression(s) / ED Diagnoses Final diagnoses:  COPD exacerbation ([place])  Shortness of breath    Rx / DC Orders ED Discharge Orders    None       Lucrezia Starch, MD 02/27/20 [phone]

## 2020-02-28 ENCOUNTER — Encounter (HOSPITAL_COMMUNITY): Payer: Self-pay | Admitting: Internal Medicine

## 2020-02-28 ENCOUNTER — Inpatient Hospital Stay (HOSPITAL_COMMUNITY): Payer: Medicare HMO

## 2020-02-28 DIAGNOSIS — E1169 Type 2 diabetes mellitus with other specified complication: Secondary | ICD-10-CM

## 2020-02-28 DIAGNOSIS — Z720 Tobacco use: Secondary | ICD-10-CM

## 2020-02-28 DIAGNOSIS — M7989 Other specified soft tissue disorders: Secondary | ICD-10-CM

## 2020-02-28 DIAGNOSIS — R600 Localized edema: Secondary | ICD-10-CM

## 2020-02-28 DIAGNOSIS — E669 Obesity, unspecified: Secondary | ICD-10-CM

## 2020-02-28 DIAGNOSIS — I351 Nonrheumatic aortic (valve) insufficiency: Secondary | ICD-10-CM

## 2020-02-28 DIAGNOSIS — I161 Hypertensive emergency: Secondary | ICD-10-CM

## 2020-02-28 DIAGNOSIS — M79609 Pain in unspecified limb: Secondary | ICD-10-CM

## 2020-02-28 DIAGNOSIS — I361 Nonrheumatic tricuspid (valve) insufficiency: Secondary | ICD-10-CM

## 2020-02-28 DIAGNOSIS — N1831 Chronic kidney disease, stage 3a: Secondary | ICD-10-CM

## 2020-02-28 DIAGNOSIS — R778 Other specified abnormalities of plasma proteins: Secondary | ICD-10-CM

## 2020-02-28 LAB — HIV ANTIBODY (ROUTINE TESTING W REFLEX): HIV Screen 4th Generation wRfx: REACTIVE — AB

## 2020-02-28 LAB — BASIC METABOLIC PANEL
Anion gap: 13 (ref 5–15)
BUN: 19 mg/dL (ref 8–23)
CO2: 23 mmol/L (ref 22–32)
Calcium: 8.7 mg/dL — ABNORMAL LOW (ref 8.9–10.3)
Chloride: 104 mmol/L (ref 98–111)
Creatinine, Ser: 1.54 mg/dL — ABNORMAL HIGH (ref 0.44–1.00)
GFR calc Af Amer: 39 mL/min — ABNORMAL LOW (ref >60)
GFR calc non Af Amer: 34 mL/min — ABNORMAL LOW (ref >60)
Glucose, Bld: 222 mg/dL — ABNORMAL HIGH (ref 70–99)
Potassium: 4.5 mmol/L (ref 3.5–5.1)
Sodium: 140 mmol/L (ref 135–145)

## 2020-02-28 LAB — TROPONIN I (HIGH SENSITIVITY): Troponin I (High Sensitivity): 26 ng/L — ABNORMAL HIGH (ref <18)

## 2020-02-28 LAB — RAPID URINE DRUG SCREEN, HOSP PERFORMED
Amphetamines: NOT DETECTED
Barbiturates: NOT DETECTED
Benzodiazepines: NOT DETECTED
Cocaine: NOT DETECTED
Opiates: NOT DETECTED
Tetrahydrocannabinol: NOT DETECTED

## 2020-02-28 LAB — GLUCOSE, CAPILLARY
Glucose-Capillary: 207 mg/dL — ABNORMAL HIGH (ref 70–99)
Glucose-Capillary: 141 mg/dL — ABNORMAL HIGH (ref 70–99)
Glucose-Capillary: 161 mg/dL — ABNORMAL HIGH (ref 70–99)
Glucose-Capillary: 173 mg/dL — ABNORMAL HIGH (ref 70–99)

## 2020-02-28 LAB — CBC
HCT: 41.6 % (ref 36.0–46.0)
Hemoglobin: 13.3 g/dL (ref 12.0–15.0)
MCH: 29.6 pg (ref 26.0–34.0)
MCHC: 32 g/dL (ref 30.0–36.0)
MCV: 92.4 fL (ref 80.0–100.0)
Platelets: 349 10*3/uL (ref 150–400)
RBC: 4.5 MIL/uL (ref 3.87–5.11)
RDW: 14.8 % (ref 11.5–15.5)
WBC: 5.8 10*3/uL (ref 4.0–10.5)
nRBC: 0 % (ref 0.0–0.2)

## 2020-02-28 LAB — ECHOCARDIOGRAM COMPLETE
Height: 67 in
Weight: 4141.12 oz

## 2020-02-28 LAB — RESPIRATORY PANEL BY RT PCR (FLU A&B, COVID)
Influenza A by PCR: NEGATIVE
Influenza B by PCR: NEGATIVE
SARS Coronavirus 2 by RT PCR: NEGATIVE

## 2020-02-28 LAB — MAGNESIUM
Magnesium: 2.3 mg/dL (ref 1.7–2.4)
Magnesium: 2.4 mg/dL (ref 1.7–2.4)

## 2020-02-28 LAB — PHOSPHORUS: Phosphorus: 4 mg/dL (ref 2.5–4.6)

## 2020-02-28 LAB — PROCALCITONIN: Procalcitonin: <0.1 ng/mL

## 2020-02-28 LAB — MRSA PCR SCREENING: MRSA by PCR: NEGATIVE

## 2020-02-28 LAB — TSH: TSH: 0.471 u[IU]/mL (ref 0.350–4.500)

## 2020-02-28 MED ORDER — CHLORHEXIDINE GLUCONATE CLOTH 2 % EX PADS
6.0000 | MEDICATED_PAD | Freq: Every day | CUTANEOUS | Status: DC
  Administered 2020-02-28: 6 via TOPICAL

## 2020-02-28 MED ORDER — METOPROLOL TARTRATE 25 MG PO TABS
25.0000 mg | ORAL_TABLET | Freq: Two times a day (BID) | ORAL | Status: DC
  Administered 2020-02-28 (×2): 25 mg via ORAL
  Filled 2020-02-28 (×2): qty 1

## 2020-02-28 MED ORDER — IPRATROPIUM-ALBUTEROL 0.5-2.5 (3) MG/3ML IN SOLN: 3.0000 mL | RESPIRATORY_TRACT | Status: DC | PRN

## 2020-02-28 NOTE — Progress Notes (Signed)
[facility] Progress Note Patient Name: Alicia [name] DOB: 1951-02-17 MRN: [id number]   Date of Service  02/28/2020  HPI/Events of Note  Patient seen in ED with new onset shortness of breath and uncontrolled blood pressure which  was resistant to PRN antihypertensives. Pt admitted to the ICU for possible Cardene infusion for BP control.  eICU Interventions  New Patient Evaluation completed.        Haygen Zebrowski U Delvin Hedeen 02/28/2020, 1:40 AM

## 2020-02-28 NOTE — Progress Notes (Signed)
Left lower extremity venous duplex completed. Refer to "CV Proc" under chart review to view preliminary results.  02/28/2020 2:16 PM Kelby Aline., MHA, RVT, RDCS, RDMS

## 2020-02-28 NOTE — TOC Initial Note (Signed)
Transition of Care [facility]) - Initial/Assessment Note    Patient Details  Name: Alicia [name] MRN: [id number] Date of Birth: 09-Oct-1951  Transition of Care [facility]) CM/SW Contact:    Curlene Labrum, RN Phone Number: 02/28/2020, 3:16 PM  Clinical Narrative:                 Alicia [name] a 69 y.o.femalewith medical history significant ofallergies, arthritis, chronic headaches, constipation, depression, non-insulin-dependent type 2 diabetes, hypertension, hyperlipidemia, obesity (BMI 39), PAD, CVA, CKD stage III, tobacco use presenting to the ED for evaluation of shortness of breath.Reports 1 week history of progressively worsening dyspnea and wheezing.  Case Management met with the patient at the bedside prior to transferring out to new nursing floor.  Case management consult noted for Advanced Directives  - patient states that she does not have advanced directives and is not interested in having them for this hospital stay or the near future.  She understands that she is a full code and declined having the Chaplain visit to complete an Advanced Directives for this visit.  Case management will continue to follow for further needs.   Expected Discharge Plan: [place] Barriers to Discharge: Family Issues(patient lives alone with no family in town)   Patient Goals and CMS Choice Patient states their goals for this hospitalization and ongoing recovery are:: I'm ready to get feeling better.      Expected Discharge Plan and Services Expected Discharge Plan: [facility] [place]   Discharge Planning Services: CM Consult   Living arrangements for the past 2 months: Apartment                                      Prior Living Arrangements/Services Living arrangements for the past 2 months: Apartment Lives with:: Self Patient language and need for interpreter reviewed:: Yes Do you feel safe going back to the place where you live?:  Yes      Need for Family Participation in Patient Care: Yes (Comment) Care giver support system in place?: Yes (comment) Current home services: DME(patient currently has RW, cane, 3:1) Criminal Activity/Legal Involvement Pertinent to Current Situation/Hospitalization: No - Comment as needed  Activities of Daily Living Home Assistive Devices/Equipment: CBG Meter ADL Screening (condition at time of admission) Patient's cognitive ability adequate to safely complete daily activities?: No Is the patient deaf or have difficulty hearing?: No Does the patient have difficulty seeing, even when wearing glasses/contacts?: No Does the patient have difficulty concentrating, remembering, or making decisions?: No Patient able to express need for assistance with ADLs?: Yes Does the patient have difficulty dressing or bathing?: No Independently performs ADLs?: Yes (appropriate for developmental age) Does the patient have difficulty walking or climbing stairs?: No Weakness of Legs: Left Weakness of Arms/Hands: None  Permission Sought/Granted Permission sought to share information with : Case Manager Permission granted to share information with : Yes, Verbal Permission Granted              Emotional Assessment Appearance:: Appears stated age Attitude/Demeanor/Rapport: Gracious Affect (typically observed): Accepting Orientation: : Oriented to Self, Oriented to Place, Oriented to  Time, Oriented to Situation Alcohol / Substance Use: Not Applicable Psych Involvement: No (comment)  Admission diagnosis:  Shortness of breath [R06.02] COPD exacerbation (HCC) [J44.1] Hypertensive emergency [I16.1] Exacerbation of RAD (reactive airway disease) [J45.901] Patient Active Problem List   Diagnosis Date Noted  .  Exacerbation of RAD (reactive airway disease) 02/27/2020  . Hypertensive urgency 02/27/2020  . CKD (chronic kidney disease) stage 3, GFR 30-59 ml/min 02/27/2020  . Tobacco use 02/27/2020  .  Hypertensive emergency 02/27/2020  . Prolonged Q-T interval on ECG 04/27/2018  . CHF (congestive heart failure) ([place]) 04/27/2018  . Weakness 04/03/2018  . Syncope 04/03/2018  . Renal insufficiency 03/16/2018  . Diabetes mellitus type 2 in obese ([place]) 03/16/2018  . Morbid obesity ([place])   . CVA (cerebral vascular accident) ([place]) 03/15/2018  . Numbness in both hands- and Feet 06/06/2014  . Weakness of both legs 06/06/2014  . Swelling of limb-Left Leg > Right 06/06/2014  . Peripheral vascular disease, unspecified ([place]) 11/30/2012  . Pain in limb 11/30/2012  . Atherosclerosis of native arteries of the extremities with intermittent claudication 04/06/2012  . CHEST PAIN 01/03/2011  . Hyperlipidemia 07/20/2007  . Essential hypertension, benign 07/20/2007  . ARTHRITIS 07/20/2007   PCP:  Rutherford Guys, MD Pharmacy:   [facility] [address] [place]), [place] - [address] [place] ([place]) [place] [address] Phone: [phone] Fax: [phone]     Social Determinants of Health (SDOH) Interventions    Readmission Risk Interventions No flowsheet data found.

## 2020-02-28 NOTE — Significant Event (Signed)
HIV screening positive, antibody levels sent.   Went to speak with patient and currently sleeping, will defer to daytime and pending confirmatory testing for further discussion. Bedside nursing updated. Primary team to follow up labs  Claris Pong P/CCM

## 2020-02-28 NOTE — Progress Notes (Signed)
  Echocardiogram 2D Echocardiogram has been performed.  Jennette Dubin 02/28/2020, 9:15 AM

## 2020-02-28 NOTE — Progress Notes (Signed)
[facility] Progress Note Patient Name: Alicia [name] DOB: Oct 18, 1951 MRN: [id number]   Date of Service  02/28/2020  HPI/Events of Note  Pt admitted to the ICU for uncontrolled hypertension. Patient also with shortness of breath .  eICU Interventions  New Patient Evaluation completed.        Kerry Kass Willman Cuny 02/28/2020, 2:20 AM

## 2020-02-28 NOTE — Progress Notes (Addendum)
NAME:  Alicia [name], MRN:  [id number], DOB:  1951-04-12, LOS: 1 ADMISSION DATE:  02/27/2020, CONSULTATION DATE:  02/27/2019 REFERRING MD:  Dr. Marlowe Sax CHIEF COMPLAINT:  SOB  Brief History   Alicia [name] is a 69 y.o. female with medical history significant of allergies, arthritis, chronic headaches, constipation, depression, non-insulin-dependent type 2 diabetes, hypertension, hyperlipidemia, obesity (BMI 49), PAD, CVA, CKD stage III, tobacco use presenting to the ED for evaluation of shortness of breath.  Reports 1 week history of progressively worsening dyspnea and wheezing.  Past Medical History  Type II Diabetes Mellitus Hypertension Hyperlipidemia Obesity (BMI 39) PAD CVA CKD Stage III Depression Tobacco use Allergies Arthritis Chronic headaches Constipation  Significant Hospital Events   4/3 admitted  Consults:  PCCM  Procedures:  N/A  Significant Diagnostic Tests:  N/A  Micro Data:  COVID >> NEG HIV AB >> POS HIV-1/2 AB >> PENDING  Antimicrobials:  N/A  Interim history/subjective:  Improvement in breathing and wheezing today, O2 sat 97% on 2L [place]. Still with dry cough, but patient believes it has improved from prior. Denies chest pain, nausea, or vomiting. Endorses headache, dizziness, and blurry vision but states that these are chronic issues.   Objective   Blood pressure (!) 151/68, pulse 95, temperature 98.3 F (36.8 C), resp. rate (!) 22, height 5\' 7"  (1.702 m), weight 117.4 kg, SpO2 97 %.        Intake/Output Summary (Last 24 hours) at 02/28/2020 0915 Last data filed at 02/28/2020 0800 Gross per 24 hour  Intake 798.5 ml  Output 350 ml  Net 448.5 ml   Filed Weights   02/27/20 1059 02/28/20 0118  Weight: 114.8 kg 117.4 kg    Examination: General:  awake, alert, resting comfortably no distress HEENT: Nasal cannula Lungs: clear to auscultation bilaterally, no wheezes or crackles Cardiovascular: Tachycardic, HR in 90s with regular  rhythm, no murmurs, rubs, or gallops Abdomen: obese, soft, no tenderness Extremities: No LE edema Neuro: Alert and responsive  Resolved Hospital Problem list     Assessment & Plan:   Hypertensive Emergency:  - secondary to non-adherence to home medications, patient has been filling but forgetting to take them. - Goal blood pressure around 160/100 in the next 24 hours, then gradually reduce further. - Start home Metoprolol tartrate 25mg  BID - Continue scheduled Enalapril and Nifedipine - Continue PRN Hydralazine - echocardiogram pending  Acute pulmonary edema - PCT low, no evidence of infection. Chest xray with cardiomegaly and bilateral pleural effusions, personally reviewed.  - hold further steroids. - suspect cardiac wheezes rather than copd exacerbation in this setting.  - s/p lasix on admission, now improved.  CKD stage III: Creatinine 1.4, slightly elevated from baseline Repeat BMP in am.   Positive HIV Ab screen - patient has not yet been notified of positive screen, will await further testing.  - F/u HIV-1/2 Ab - F/u HIV-1 RNA   Tobacco use disorder: NicoDerm patch and counseling   Non-insulin-dependent type 2 diabetes: Last A1c 7.3 on 01/06/2020.  Order sliding scale insulin sensitive ACHS and CBG checks.   Elevated troponin: In the setting of hypertensive urgency.  Troponin mildly elevated and EKG without acute ischemic changes.  chest pain resolved   QT prolongation on EKG: Continue cardiac monitoring.  Keep potassium above 4 and magnesium above 2.  Repeat EKG in a.m. Avoid QT prolonging drugs if possible.   Left lower extremity edema: Doppler ordered to rule out DVT.  Hyperlipidemia: H/o hyperlipidemia and type 2  DM. Last lipid panel with total cholesterol 266, TG 159, HDL 52, LDL 185 without statin use. - F/u lipid panel    Best practice:  Diet: Carb modified with 1200 mL fluid restriction Pain/Anxiety/Delirium protocol (if indicated): N/A VAP protocol (if  indicated): N/A DVT prophylaxis: Lovenox GI prophylaxis: N/A Glucose control: SSI Mobility: Bed rest Code Status: Full Family Communication: patient updated.  Disposition: Anticipate discharge after improvement of respiratory status and blood pressure  Labs   CBC: Recent Labs  Lab 02/27/20 1121 02/27/20 1650 02/28/20 0347  WBC 5.9  --  5.8  HGB 12.4 12.6 13.3  HCT 41.4 37.0 41.6  MCV 96.3  --  92.4  PLT 270  --  629    Basic Metabolic Panel: Recent Labs  Lab 02/27/20 1121 02/27/20 1650 02/27/20 2355 02/28/20 0347  NA 142 142  --  140  K 3.6 3.7  --  4.5  CL 106  --   --  104  CO2 24  --   --  23  GLUCOSE 163*  --   --  222*  BUN 14  --   --  19  CREATININE 1.44*  --   --  1.54*  CALCIUM 8.6*  --   --  8.7*  MG  --   --  2.4 2.3  PHOS  --   --   --  4.0   GFR: Estimated Creatinine Clearance: 45.7 mL/min (A) (by C-G formula based on SCr of 1.54 mg/dL (H)). Recent Labs  Lab 02/27/20 1121 02/27/20 2355 02/28/20 0347  PROCALCITON  --  <0.10  --   WBC 5.9  --  5.8    Liver Function Tests: No results for input(s): AST, ALT, ALKPHOS, BILITOT, PROT, ALBUMIN in the last 168 hours. No results for input(s): LIPASE, AMYLASE in the last 168 hours. No results for input(s): AMMONIA in the last 168 hours.  ABG    Component Value Date/Time   PHART 7.389 02/27/2020 1650   PCO2ART 46.5 02/27/2020 1650   PO2ART 111.0 (H) 02/27/2020 1650   HCO3 28.1 (H) 02/27/2020 1650   TCO2 29 02/27/2020 1650   O2SAT 98.0 02/27/2020 1650     Coagulation Profile: No results for input(s): INR, PROTIME in the last 168 hours.  Cardiac Enzymes: No results for input(s): CKTOTAL, CKMB, CKMBINDEX, TROPONINI in the last 168 hours.  HbA1C: Hemoglobin A1C  Date/Time Value Ref Range Status  01/06/2020 09:57 AM 7.3 (A) 4.0 - 5.6 % Final  02/07/2019 11:41 AM 7.5 (A) 4.0 - 5.6 % Final   Hgb A1c MFr Bld  Date/Time Value Ref Range Status  10/09/2018 12:59 PM 7.1 (H) 4.8 - 5.6 % Final     Comment:             Prediabetes: 5.7 - 6.4          Diabetes: >6.4          Glycemic control for adults with diabetes: <7.0   06/21/2018 12:32 PM 7.8 (H) 4.8 - 5.6 % Final    Comment:             Prediabetes: 5.7 - 6.4          Diabetes: >6.4          Glycemic control for adults with diabetes: <7.0     CBG: Recent Labs  Lab 02/27/20 1439 02/27/20 2142 02/28/20 0841  GLUCAP 142* 194* 207*     Written by Alric Seton, MS4 MEDICAL STUDENT  The  patient is critically ill with multiple organ systems failure and requires high complexity decision making for assessment and support, frequent evaluation and titration of therapies, application of advanced monitoring technologies and extensive interpretation of multiple databases.   Critical Care Time devoted to patient care services described in this note is 41 minutes. This time reflects time of care of this [place] . This critical care time does not reflect separately billable procedures or procedure time, teaching time or supervisory time of PA/NP/Med student/Med Resident etc but could involve care discussion time.  Leone [facility] 02/28/2020 11:03 AM  Pager: [phone] After hours pager: [phone]

## 2020-02-28 NOTE — Plan of Care (Signed)
  Problem: Health Behavior/Discharge Planning: Goal: Ability to manage health-related needs will improve Outcome: Not Progressing   Problem: Clinical Measurements: Goal: Ability to maintain clinical measurements within normal limits will improve Outcome: Progressing   Problem: Clinical Measurements: Goal: Will remain free from infection Outcome: Progressing   Problem: Clinical Measurements: Goal: Diagnostic test results will improve Outcome: Progressing   Problem: Clinical Measurements: Goal: Respiratory complications will improve Outcome: Progressing

## 2020-02-28 NOTE — Progress Notes (Signed)
Report called to receiving RN [phone]. Patient with no complaints at the current time. Will transfer via [place].

## 2020-02-29 DIAGNOSIS — J45901 Unspecified asthma with (acute) exacerbation: Secondary | ICD-10-CM | POA: Diagnosis not present

## 2020-02-29 LAB — BASIC METABOLIC PANEL
Anion gap: 10 (ref 5–15)
BUN: 29 mg/dL — ABNORMAL HIGH (ref 8–23)
CO2: 25 mmol/L (ref 22–32)
Calcium: 8.4 mg/dL — ABNORMAL LOW (ref 8.9–10.3)
Chloride: 106 mmol/L (ref 98–111)
Creatinine, Ser: 1.76 mg/dL — ABNORMAL HIGH (ref 0.44–1.00)
GFR calc Af Amer: 34 mL/min — ABNORMAL LOW (ref >60)
GFR calc non Af Amer: 29 mL/min — ABNORMAL LOW (ref >60)
Glucose, Bld: 149 mg/dL — ABNORMAL HIGH (ref 70–99)
Potassium: 3.8 mmol/L (ref 3.5–5.1)
Sodium: 141 mmol/L (ref 135–145)

## 2020-02-29 LAB — LIPID PANEL
Cholesterol: 209 mg/dL — ABNORMAL HIGH (ref 0–200)
HDL: 54 mg/dL (ref >40)
LDL Cholesterol: 133 mg/dL — ABNORMAL HIGH (ref 0–99)
Total CHOL/HDL Ratio: 3.9 RATIO
Triglycerides: 109 mg/dL (ref <150)
VLDL: 22 mg/dL (ref 0–40)

## 2020-02-29 LAB — GLUCOSE, CAPILLARY
Glucose-Capillary: 134 mg/dL — ABNORMAL HIGH (ref 70–99)
Glucose-Capillary: 212 mg/dL — ABNORMAL HIGH (ref 70–99)
Glucose-Capillary: 157 mg/dL — ABNORMAL HIGH (ref 70–99)
Glucose-Capillary: 186 mg/dL — ABNORMAL HIGH (ref 70–99)

## 2020-02-29 LAB — HIV-1 RNA QUANT-NO REFLEX-BLD
HIV 1 RNA Quant: <20 copies/mL
LOG10 HIV-1 RNA: UNDETERMINED log10copy/mL

## 2020-02-29 MED ORDER — SENNOSIDES-DOCUSATE SODIUM 8.6-50 MG PO TABS
1.0000 | ORAL_TABLET | Freq: Two times a day (BID) | ORAL | Status: DC
  Administered 2020-02-29 – 2020-03-01 (×4): 1 via ORAL
  Filled 2020-02-29 (×5): qty 1

## 2020-02-29 MED ORDER — ISOSORBIDE MONONITRATE ER 30 MG PO TB24
30.0000 mg | ORAL_TABLET | Freq: Every day | ORAL | Status: DC
  Administered 2020-02-29 – 2020-03-02 (×3): 30 mg via ORAL
  Filled 2020-02-29 (×4): qty 1

## 2020-02-29 MED ORDER — HYDRALAZINE HCL 50 MG PO TABS
50.0000 mg | ORAL_TABLET | Freq: Three times a day (TID) | ORAL | Status: DC
  Administered 2020-02-29 – 2020-03-01 (×4): 50 mg via ORAL
  Filled 2020-02-29 (×4): qty 1

## 2020-02-29 MED ORDER — ROSUVASTATIN CALCIUM 5 MG PO TABS
10.0000 mg | ORAL_TABLET | Freq: Every day | ORAL | Status: DC
  Administered 2020-02-29 – 2020-03-02 (×3): 10 mg via ORAL
  Filled 2020-02-29 (×4): qty 2

## 2020-02-29 MED ORDER — METOPROLOL SUCCINATE ER 50 MG PO TB24
50.0000 mg | ORAL_TABLET | Freq: Every day | ORAL | Status: DC
  Administered 2020-02-29 – 2020-03-02 (×3): 50 mg via ORAL
  Filled 2020-02-29 (×3): qty 1

## 2020-02-29 MED ORDER — ASPIRIN EC 81 MG PO TBEC
81.0000 mg | DELAYED_RELEASE_TABLET | Freq: Every day | ORAL | Status: DC
  Administered 2020-02-29 – 2020-03-02 (×3): 81 mg via ORAL
  Filled 2020-02-29 (×4): qty 1

## 2020-02-29 NOTE — Plan of Care (Signed)
Pt transferred to unit on RA and purewick in place, SpO2 was in mid-80s and patient labored after transferring from wheelchair to bed. 2L O2 [place] was applied, O2 Sats up to >92%. Purewick removed and replaced. CHG bath provided and sacral dressing replaced.   Problem: Clinical Measurements: Goal: Respiratory complications will improve Outcome: Progressing

## 2020-02-29 NOTE — Progress Notes (Signed)
[facility] Progress Note  Patient: Alicia [name]    WIO:[id number]  DOA: 02/27/2020     Date of Service: the patient was seen and examined on 02/29/2020  Chief complaint. Shortness of breath.  Brief hospital course: Past medical history of HTN, CVA, PAD, CKD 3, smoking, HLD, type II DM, depression.  Presented with complaints of shortness of breath.  Found to have asthma exacerbation as well as hypertensive urgency.  Started on nicardipine drip and was admitted to the ICU. Blood pressure stabilized on resumption of home medication as the patient was noncompliant with her home medication.  Patient was also diuresed with IV Lasix. Currently further plan is monitor improvement in renal function and blood pressure.  Assessment and Plan: 1.  Hypertensive emergency Noncompliance with medical regimen Acute pulmonary edema Acute on chronic diastolic CHF Presents with shortness of breath. Was hypotensive and hypoxic. Started on nicardipine drip. After that patient was started on her home regimen of blood pressure medications. Patient was also started on IV Lasix for diuresis. Currently renal function mildly worsening from baseline. Oxygenation improving but still has some wheezing. No further leg swelling. Echocardiogram shows preserved EF with diastolic dysfunction. We will discontinue enalapril and hydralazine and Imdur continue nifedipine and change metoprolol twice daily to Toprol-XL home regimen. Discontinue Aldactone at discharge. Monitor renal function.  2.  Acute on chronic kidney disease stage IIIb Renal function worsening from baseline likely in the setting of hypertensive urgency with hemodynamic injury as well as diuresis. Patient was also on ACE inhibitor. Currently holding ACE inhibitor. No further diuresis for now. Monitor renal function.  Avoid nephrotoxic medications.  3.  Concern for asthma. Appear to have cardiac wheezing.  Monitor.  4.  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 - patient has not yet been notified of positive screen, will await further testing.  - F/u HIV-1/2 Ab - F/u HIV-1 RNA  5.  Type 2 diabetes mellitus.  With renal complication. Blood sugars well controlled. Hemoglobin A1c 7.3. Monitor.  Continue sliding scale insulin.  6.  Elevated troponins. QT prolongation on EKG. No chest pain. Troponins mildly elevated not consistent with ACS. EKG negative for any acute ischemia. Prolonged QTC but echocardiogram shows negative abnormality. QT prolongation likely in the setting of hypertension.  Monitor.  7.  Hyperlipidemia. Continuing home medication.  8.  Morbid obesity Body mass index is 40.38 kg/m.  Patient may have undiagnosed sleep apnea.  May benefit from outpatient sleep study.  Diet: Cardiac diet. DVT Prophylaxis: Subcutaneous Heparin    Advance goals of care discussion: Full code  Family Communication: no family was present at bedside, at the time of interview.   Disposition:  Pt is from home, admitted with hypertensive emergency, still has high blood pressure as well as acute kidney injury, which precludes a safe discharge. Discharge to home, when once renal function improves.  Likely tomorrow.  Subjective: Continues to have fatigue and tiredness.  Also reports some dizziness without any room spinning sensation.  No nausea no vomiting.  No further focal deficit.  Physical Exam: General:  alert oriented to time, place, and person.  Appear in mild distress, affect appropriate Eyes: PERRL ENT: Oral Mucosa Clear, moist  Neck: no JVD,  Cardiovascular: S1 and S2 Present, no Murmur,  Respiratory: good respiratory effort, Bilateral Air entry equal and Decreased, no Crackles, Occasional  wheezes Abdomen: Bowel Sound present, Soft and no tenderness,  Skin: no rash Extremities: no Pedal edema, no calf tenderness Neurologic: without any new focal findings, mental  status, alert and oriented x3, speech normal, PERLA, Motor  strength 5/5 and symmetric, Sensation grossly normal to light touch and Reflex difficult to assess Gait not checked due to patient safety concerns  Vitals:   02/29/20 1350 02/29/20 1458 02/29/20 1718 02/29/20 1719  BP: 140/79 135/74 140/62 (!) 141/62  Pulse: 72  76 76  Resp: 18  18 20   Temp: 97.7 F (36.5 C)  98.2 F (36.8 C)   TempSrc: Oral  Oral   SpO2: 96%  95% 96%  Weight:      Height:        Intake/Output Summary (Last 24 hours) at 02/29/2020 1859 Last data filed at 02/29/2020 1100 Gross per 24 hour  Intake 340 ml  Output -  Net 340 ml   Filed Weights   02/28/20 0118 02/28/20 2019 02/29/20 0404  Weight: 117.4 kg 118.2 kg 118.7 kg    Data Reviewed: I have personally reviewed and interpreted daily labs, tele strips, imagings as discussed above. I reviewed all nursing notes, pharmacy notes, vitals, pertinent old records I have discussed plan of care as described above with RN and patient/family.  CBC: Recent Labs  Lab 02/27/20 1121 02/27/20 1650 02/28/20 0347  WBC 5.9  --  5.8  HGB 12.4 12.6 13.3  HCT 41.4 37.0 41.6  MCV 96.3  --  92.4  PLT 270  --  962   Basic Metabolic Panel: Recent Labs  Lab 02/27/20 1121 02/27/20 1650 02/27/20 2355 02/28/20 0347 02/29/20 0331  NA 142 142  --  140 141  K 3.6 3.7  --  4.5 3.8  CL 106  --   --  104 106  CO2 24  --   --  23 25  GLUCOSE 163*  --   --  222* 149*  BUN 14  --   --  19 29*  CREATININE 1.44*  --   --  1.54* 1.76*  CALCIUM 8.6*  --   --  8.7* 8.4*  MG  --   --  2.4 2.3  --   PHOS  --   --   --  4.0  --     Studies: No results found.  Scheduled Meds: . aspirin EC  81 mg Oral Daily  . budesonide (PULMICORT) nebulizer solution  0.25 mg Nebulization BID  . enoxaparin (LOVENOX) injection  40 mg Subcutaneous Q24H  . hydrALAZINE  50 mg Oral Q8H  . insulin aspart  0-5 Units Subcutaneous QHS  . insulin aspart  0-9 Units Subcutaneous TID WC  . isosorbide mononitrate  30 mg Oral Daily  . metoprolol succinate   50 mg Oral Daily  . nicotine  14 mg Transdermal Daily  . NIFEdipine  60 mg Oral Daily  . pantoprazole  40 mg Oral Daily  . rosuvastatin  10 mg Oral Daily  . senna-docusate  1 tablet Oral BID   Continuous Infusions: PRN Meds: acetaminophen **OR** acetaminophen, hydrALAZINE, ipratropium-albuterol, polyethylene glycol  Time spent: 35 minutes  Author: Berle Mull, MD [facility] 02/29/2020 6:59 PM  To reach On-call, see care teams to locate the attending and reach out to them via [email or web address]. If 7PM-7AM, please contact night-coverage If you still have difficulty reaching the attending provider, please page the [facility] (Director on Call) for [facility] on amion for assistance.

## 2020-03-01 DIAGNOSIS — J45901 Unspecified asthma with (acute) exacerbation: Secondary | ICD-10-CM | POA: Diagnosis not present

## 2020-03-01 LAB — RENAL FUNCTION PANEL
Albumin: 2.9 g/dL — ABNORMAL LOW (ref 3.5–5.0)
Anion gap: 10 (ref 5–15)
BUN: 31 mg/dL — ABNORMAL HIGH (ref 8–23)
CO2: 24 mmol/L (ref 22–32)
Calcium: 8.1 mg/dL — ABNORMAL LOW (ref 8.9–10.3)
Chloride: 106 mmol/L (ref 98–111)
Creatinine, Ser: 1.95 mg/dL — ABNORMAL HIGH (ref 0.44–1.00)
GFR calc Af Amer: 30 mL/min — ABNORMAL LOW (ref >60)
GFR calc non Af Amer: 26 mL/min — ABNORMAL LOW (ref >60)
Glucose, Bld: 176 mg/dL — ABNORMAL HIGH (ref 70–99)
Phosphorus: 4.3 mg/dL (ref 2.5–4.6)
Potassium: 4.1 mmol/L (ref 3.5–5.1)
Sodium: 140 mmol/L (ref 135–145)

## 2020-03-01 LAB — GLUCOSE, CAPILLARY
Glucose-Capillary: 142 mg/dL — ABNORMAL HIGH (ref 70–99)
Glucose-Capillary: 149 mg/dL — ABNORMAL HIGH (ref 70–99)
Glucose-Capillary: 205 mg/dL — ABNORMAL HIGH (ref 70–99)
Glucose-Capillary: 174 mg/dL — ABNORMAL HIGH (ref 70–99)

## 2020-03-01 LAB — CBC
HCT: 37.8 % (ref 36.0–46.0)
Hemoglobin: 11.6 g/dL — ABNORMAL LOW (ref 12.0–15.0)
MCH: 28.9 pg (ref 26.0–34.0)
MCHC: 30.7 g/dL (ref 30.0–36.0)
MCV: 94.3 fL (ref 80.0–100.0)
Platelets: 301 10*3/uL (ref 150–400)
RBC: 4.01 MIL/uL (ref 3.87–5.11)
RDW: 14.8 % (ref 11.5–15.5)
WBC: 5.4 10*3/uL (ref 4.0–10.5)
nRBC: 0 % (ref 0.0–0.2)

## 2020-03-01 MED ORDER — POLYETHYLENE GLYCOL 3350 17 G PO PACK
17.0000 g | PACK | Freq: Every day | ORAL | Status: DC
  Administered 2020-03-01 – 2020-03-02 (×2): 17 g via ORAL
  Filled 2020-03-01 (×2): qty 1

## 2020-03-01 MED ORDER — HYDRALAZINE HCL 50 MG PO TABS
75.0000 mg | ORAL_TABLET | Freq: Three times a day (TID) | ORAL | Status: DC
  Administered 2020-03-01 – 2020-03-02 (×4): 75 mg via ORAL
  Filled 2020-03-01 (×4): qty 1

## 2020-03-01 MED ORDER — HYDRALAZINE HCL 25 MG PO TABS
25.0000 mg | ORAL_TABLET | Freq: Once | ORAL | Status: AC
  Administered 2020-03-01: 25 mg via ORAL
  Filled 2020-03-01: qty 1

## 2020-03-01 MED ORDER — LACTULOSE 10 GM/15ML PO SOLN
20.0000 g | Freq: Two times a day (BID) | ORAL | Status: DC
  Administered 2020-03-01: 20 g via ORAL
  Filled 2020-03-01 (×3): qty 30

## 2020-03-01 NOTE — Progress Notes (Signed)
[facility] Progress Note  Patient: Alicia [name]    DTO:[id number]  DOA: 02/27/2020     Date of Service: the patient was seen and examined on 03/01/2020  Chief complaint. Shortness of breath.  Brief hospital course: Past medical history of HTN, CVA, PAD, CKD 3, smoking, HLD, type II DM, depression.  Presented with complaints of shortness of breath.  Found to have asthma exacerbation as well as hypertensive urgency.  Started on nicardipine drip and was admitted to the ICU. Blood pressure stabilized on resumption of home medication as the patient was noncompliant with her home medication.  Patient was also diuresed with IV Lasix. Currently further plan is monitor improvement in renal function and blood pressure.  Assessment and Plan: 1.  Hypertensive emergency Noncompliance with medical regimen Acute pulmonary edema Acute on chronic diastolic CHF Presents with shortness of breath. Was hypotensive and hypoxic. Started on nicardipine drip. After that patient was started on her home regimen of blood pressure medications. Patient was also started on IV Lasix for diuresis. Currently renal function mildly worsening from baseline. Oxygenation improving but still has some wheezing. No further leg swelling. Echocardiogram shows preserved EF with diastolic dysfunction. We will discontinue enalapril and hydralazine and Imdur continue nifedipine and change metoprolol twice daily to Toprol-XL home regimen. Discontinue Aldactone at discharge. Monitor renal function.  2.  Acute on chronic kidney disease stage IIIb Renal function worsening from baseline likely in the setting of hypertensive urgency with hemodynamic injury as well as diuresis. Patient was also on ACE inhibitor. Currently holding ACE inhibitor. No further diuresis for now. Monitor renal function.  Avoid nephrotoxic medications.  3.  Concern for asthma. Appear to have cardiac wheezing.  Monitor.  4.  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 - patient has not yet been notified of positive screen, will await further testing.  - F/u HIV-1/2 Ab - F/u HIV-1 RNA  5.  Type 2 diabetes mellitus.  With renal complication. Blood sugars well controlled. Hemoglobin A1c 7.3. Monitor.  Continue sliding scale insulin.  6.  Elevated troponins. QT prolongation on EKG. No chest pain. Troponins mildly elevated not consistent with ACS. EKG negative for any acute ischemia. Prolonged QTC but echocardiogram shows negative abnormality. QT prolongation likely in the setting of hypertension.  Monitor.  7.  Hyperlipidemia. Continuing home medication.  8.  Morbid obesity Body mass index is 40.38 kg/m.  Patient may have undiagnosed sleep apnea.  May benefit from outpatient sleep study.  Diet: Cardiac diet. DVT Prophylaxis: Subcutaneous Heparin    Advance goals of care discussion: Full code  Family Communication: no family was present at bedside, at the time of interview.   Disposition:  Pt is from home, admitted with hypertensive emergency, still has high blood pressure as well as acute kidney injury, which precludes a safe discharge. Discharge to home, when once renal function improves.  Likely tomorrow.  Subjective: No nausea no vomiting no fever no chills. Continues to report dizziness and fatigue.  Physical Exam: General:  alert oriented to time, place, and person.  Appear in mild distress, affect appropriate Eyes: PERRL ENT: Oral Mucosa Clear, moist  Neck: no JVD,  Cardiovascular: S1 and S2 Present, no Murmur,  Respiratory: good respiratory effort, Bilateral Air entry equal and Decreased, no Crackles, Occasional  wheezes Abdomen: Bowel Sound present, Soft and no tenderness,  Skin: no rash Extremities: no Pedal edema, no calf tenderness Neurologic: without any new focal findings, mental status, alert and oriented x3, speech normal, PERLA, Motor strength 5/5 and  symmetric, Sensation grossly normal to light touch and  Reflex difficult to assess Gait not checked due to patient safety concerns  Vitals:   02/29/20 2024 02/29/20 2100 02/29/20 2110 03/01/20 0622  BP:  (!) 161/72  (!) 164/75  Pulse:    84  Resp:    19  Temp:   97.8 F (36.6 C) 98 F (36.7 C)  TempSrc:   Oral Oral  SpO2: 95%   98%  Weight:    118.7 kg  Height:       No intake or output data in the 24 hours ending 03/01/20 1804 Filed Weights   02/28/20 2019 02/29/20 0404 03/01/20 0622  Weight: 118.2 kg 118.7 kg 118.7 kg    Data Reviewed: I have personally reviewed and interpreted daily labs, tele strips, imagings as discussed above. I reviewed all nursing notes, pharmacy notes, vitals, pertinent old records I have discussed plan of care as described above with RN and patient/family.  CBC: Recent Labs  Lab 02/27/20 1121 02/27/20 1650 02/28/20 0347 03/01/20 0818  WBC 5.9  --  5.8 5.4  HGB 12.4 12.6 13.3 11.6*  HCT 41.4 37.0 41.6 37.8  MCV 96.3  --  92.4 94.3  PLT 270  --  349 889   Basic Metabolic Panel: Recent Labs  Lab 02/27/20 1121 02/27/20 1650 02/27/20 2355 02/28/20 0347 02/29/20 0331 03/01/20 0818  NA 142 142  --  140 141 140  K 3.6 3.7  --  4.5 3.8 4.1  CL 106  --   --  104 106 106  CO2 24  --   --  23 25 24   GLUCOSE 163*  --   --  222* 149* 176*  BUN 14  --   --  19 29* 31*  CREATININE 1.44*  --   --  1.54* 1.76* 1.95*  CALCIUM 8.6*  --   --  8.7* 8.4* 8.1*  MG  --   --  2.4 2.3  --   --   PHOS  --   --   --  4.0  --  4.3    Studies: No results found.  Scheduled Meds: . aspirin EC  81 mg Oral Daily  . budesonide (PULMICORT) nebulizer solution  0.25 mg Nebulization BID  . enoxaparin (LOVENOX) injection  40 mg Subcutaneous Q24H  . hydrALAZINE  75 mg Oral Q8H  . insulin aspart  0-5 Units Subcutaneous QHS  . insulin aspart  0-9 Units Subcutaneous TID WC  . isosorbide mononitrate  30 mg Oral Daily  . lactulose  20 g Oral BID  . metoprolol succinate  50 mg Oral Daily  . nicotine  14 mg Transdermal  Daily  . NIFEdipine  60 mg Oral Daily  . pantoprazole  40 mg Oral Daily  . polyethylene glycol  17 g Oral Daily  . rosuvastatin  10 mg Oral Daily  . senna-docusate  1 tablet Oral BID   Continuous Infusions: PRN Meds: acetaminophen **OR** acetaminophen, hydrALAZINE, ipratropium-albuterol, polyethylene glycol  Time spent: 35 minutes  Author: Berle Mull, MD [facility] 03/01/2020 6:04 PM  To reach On-call, see care teams to locate the attending and reach out to them via [email or web address]. If 7PM-7AM, please contact night-coverage If you still have difficulty reaching the attending provider, please page the [facility] (Director on Call) for [facility] on amion for assistance.

## 2020-03-01 NOTE — Evaluation (Signed)
Physical Therapy Evaluation Patient Details Name: Alicia [name] MRN: [id number] DOB: 18-Jan-1951 Today's Date: 03/01/2020   History of Present Illness  Past medical history of HTN, CVA, PAD, CKD 3, smoking, HLD, type II DM, depression.  Presented with complaints of shortness of breath.  Found to have asthma exacerbation as well as hypertensive urgency.  Started on nicardipine drip and was admitted to the ICU.Blood pressure stabilized on resumption of home medication as the patient was noncompliant with her home medication    Clinical Impression  Pt with poor understanding of her health condition - she said PCP told her to drink lots of fluids (which she has been trying to do) and now on fluid restrictions.  She hasnt been moving around much as hasnt felt good. She lives alone with very limited support - I feel HH will help her to be as safe as possible in her home environment.  I have instructed pt to use RW at all times for pt safety.  Pt walked 60 feet today with RW - her O2 sats dropped to 81% on RA (and back to 93% on RA in 2 min).  Pt may need O2 at home.  Pt needs to slowly increase her activity level and help her live a healthier lifestyle. She seems very open to advise on how to be healthier.      Follow Up Recommendations Home health PT;Supervision for mobility/OOB    Equipment Recommendations  None recommended by PT    Recommendations for Other Services       Precautions / Restrictions Precautions Precautions: Fall Precaution Comments: Pt SOB - no O2 at home      Mobility  Bed Mobility Overal bed mobility: Independent                Transfers Overall transfer level: Needs assistance Equipment used: Rolling walker (2 wheeled) Transfers: Sit to/from [facility] Sit to Stand: Supervision Stand pivot transfers: Min guard       General transfer comment: pt transfered to [facility] without RW -she didnt feel steady  Ambulation/Gait Ambulation/Gait  assistance: Min guard Gait Distance (Feet): 60 Feet Assistive device: Rolling walker (2 wheeled) Gait Pattern/deviations: Decreased stride length;Shuffle     General Gait Details: pt reports she has felt dizzy for the last year when up - ever since her stroke.  Her resting O2 95% on RA and after walking went down to 81% and back to 90% after 2 minutes on RA.  Stairs            Wheelchair Mobility    Modified Rankin (Stroke Patients Only)       Balance Overall balance assessment: Mild deficits observed, not formally tested                                           Pertinent Vitals/Pain Pain Assessment: No/denies pain    Home Living Family/patient expects to be discharged to:: Private residence Living Arrangements: Alone Available Help at Discharge: (pt says no one to help)   Home Access: Stairs to enter Entrance Stairs-Rails: Right Entrance Stairs-Number of Steps: 2 Home Layout: One level Home Equipment: [employer] - 2 wheels;Bedside commode;Cane - single point      Prior Function Level of Independence: Independent with assistive device(s)         Comments: Pt said she was using cane most of the time.  she denies recent falls.  She said MD office told her to use RW but still using cane.  she has not been doing much - didnt feel good     Hand Dominance        Extremity/Trunk Assessment        Lower Extremity Assessment Lower Extremity Assessment: Generalized weakness    Cervical / Trunk Assessment Cervical / Trunk Assessment: Normal  Communication   Communication: No difficulties  Cognition Arousal/Alertness: Awake/alert Behavior During Therapy: WFL for tasks assessed/performed Overall Cognitive Status: Within Functional Limits for tasks assessed                                 General Comments: pt pleasant and cooperative      General Comments General comments (skin integrity, edema, etc.): pt says she now has  fluid restrictions but MD appt in Feb told her to drink extra water which she has been trying to do    Exercises     Assessment/Plan    PT Assessment Patient needs continued PT services  PT Problem List Decreased strength;Decreased mobility;Decreased safety awareness;Decreased knowledge of precautions;Decreased activity tolerance;Cardiopulmonary status limiting activity;Decreased balance;Decreased knowledge of use of DME       PT Treatment Interventions DME instruction;Therapeutic activities;Gait training;Therapeutic exercise;Patient/family education;Stair training;Balance training;Functional mobility training    PT Goals (Current goals can be found in the Care Plan section)  Acute Rehab PT Goals Patient Stated Goal: to go home and have more energy PT Goal Formulation: With patient Time For Goal Achievement: 03/15/20 Potential to Achieve Goals: Good    Frequency Min 3X/week   Barriers to discharge Decreased caregiver support      Co-evaluation               AM-PAC PT "6 Clicks" Mobility  Outcome Measure Help needed turning from your back to your side while in a flat bed without using bedrails?: None Help needed moving from lying on your back to sitting on the side of a flat bed without using bedrails?: None Help needed moving to and from a bed to a chair (including a wheelchair)?: A Little Help needed standing up from a chair using your arms (e.g., wheelchair or bedside chair)?: A Little Help needed to walk in hospital room?: A Little Help needed climbing 3-5 steps with a railing? : A Little 6 Click Score: 20    End of Session Equipment Utilized During Treatment: Gait belt Activity Tolerance: Patient tolerated treatment well;Patient limited by fatigue;Treatment limited secondary to medical complications (Comment) Patient left: in chair;with chair alarm set;with call bell/phone within reach Nurse Communication: Mobility status PT Visit Diagnosis: Unsteadiness on feet  (R26.81);Other abnormalities of gait and mobility (R26.89);Muscle weakness (generalized) (M62.81);Dizziness and giddiness (R42)    Time: 6045-4098 PT Time Calculation (min) (ACUTE ONLY): 50 min   Charges:   PT Evaluation $PT Eval Low Complexity: 1 Low PT Treatments $Gait Training: 23-37 mins        03/01/2020   Rande Lawman, PT   Loyal Buba 03/01/2020, 11:54 AM

## 2020-03-02 DIAGNOSIS — J45901 Unspecified asthma with (acute) exacerbation: Secondary | ICD-10-CM | POA: Diagnosis not present

## 2020-03-02 LAB — GLUCOSE, CAPILLARY
Glucose-Capillary: 160 mg/dL — ABNORMAL HIGH (ref 70–99)
Glucose-Capillary: 147 mg/dL — ABNORMAL HIGH (ref 70–99)

## 2020-03-02 LAB — RENAL FUNCTION PANEL
Albumin: 3 g/dL — ABNORMAL LOW (ref 3.5–5.0)
Anion gap: 10 (ref 5–15)
BUN: 32 mg/dL — ABNORMAL HIGH (ref 8–23)
CO2: 24 mmol/L (ref 22–32)
Calcium: 8.1 mg/dL — ABNORMAL LOW (ref 8.9–10.3)
Chloride: 106 mmol/L (ref 98–111)
Creatinine, Ser: 1.86 mg/dL — ABNORMAL HIGH (ref 0.44–1.00)
GFR calc Af Amer: 31 mL/min — ABNORMAL LOW (ref >60)
GFR calc non Af Amer: 27 mL/min — ABNORMAL LOW (ref >60)
Glucose, Bld: 154 mg/dL — ABNORMAL HIGH (ref 70–99)
Phosphorus: 4.1 mg/dL (ref 2.5–4.6)
Potassium: 4.1 mmol/L (ref 3.5–5.1)
Sodium: 140 mmol/L (ref 135–145)

## 2020-03-02 MED ORDER — ISOSORBIDE MONONITRATE ER 30 MG PO TB24: 30.0000 mg | ORAL_TABLET | Freq: Every day | ORAL | 0 refills | Status: DC

## 2020-03-02 MED ORDER — POLYETHYLENE GLYCOL 3350 17 G PO PACK: 17.0000 g | PACK | Freq: Every day | ORAL | 0 refills | Status: DC | PRN

## 2020-03-02 MED ORDER — NICOTINE 14 MG/24HR TD PT24: 14.0000 mg | MEDICATED_PATCH | Freq: Every day | TRANSDERMAL | 0 refills | Status: DC

## 2020-03-02 MED ORDER — HYDRALAZINE HCL 100 MG PO TABS: 100.0000 mg | ORAL_TABLET | Freq: Three times a day (TID) | ORAL | 0 refills | Status: DC

## 2020-03-02 NOTE — TOC Transition Note (Addendum)
Transition of Care (TOC) - CM/SW Discharge Note Marvetta Gibbons RN, BSN Transitions of Care Unit 4E- RN Case Manager (cross coverage for [place]) [phone]   Patient Details  Name: Alicia [name] MRN: [id number] Date of Birth: 18-Mar-1951  Transition of Care [facility]) CM/SW Contact:  Dawayne Patricia, RN Phone Number: 03/02/2020, 1:08 PM   Clinical Narrative:    Pt stable for transition home, orders placed for HHRN/PT- CM spoke with pt at bedside- per pt she has found transportation home just needs to give them time she will be ready. Discussed HH recommendations and orders placed per MD- list provided to pt for [facility] choice Per CMS guidelines from medicare.gov website with star ratings (copy placed in shadow chart)- per pt she has used [facility] in the past and would like to use them again as her first choice if they are unable to accept then her second choice will be [facility]. Pt states her goal is to be able to take care of herself.  Call made to [facility] with [facility]- however they are unable to accept at this time due to staffing.  Call made to [facility] with [facility] regarding referral- msg left- and referral pending return call. - return call received- Alicia [name] has accepted referral Address, phone # and PCP all confirmed with pt in epic.    Final next level of care: [place] Barriers to Discharge: Barriers Resolved   Patient Goals and CMS Choice Patient states their goals for this hospitalization and ongoing recovery are:: I'm ready to get feeling better. CMS Medicare.gov Compare Post Acute Care list provided to:: Patient Choice offered to / list presented to : Patient  Discharge Placement                 Home with [facility]      Discharge Plan and Services   Discharge Planning Services: CM Consult Post Acute Care Choice: Home Health          DME Arranged: N/A         HH Arranged: RN, PT [place] Agency: [place] Date [place]: 03/02/20 Time HH Agency  Contacted: 15 Representative spoke with at [place]: [place]) Interventions     Readmission Risk Interventions Readmission Risk Prevention Plan 03/02/2020  Transportation Screening Complete  PCP or Specialist Appt within 5-7 Days Complete  Home Care Screening Complete  Medication Review (RN CM) Complete  Some recent data might be hidden

## 2020-03-02 NOTE — Care Management Important Message (Signed)
Important Message  Patient Details  Name: Alicia [name] MRN: [id number] Date of Birth: [birth year]   Medicare Important Message Given:  Yes     [name] 03/02/2020, 11:44 AM

## 2020-03-04 LAB — HIV-1/2 AB - DIFFERENTIATION
HIV 1 Ab: NEGATIVE
HIV 2 Ab: NEGATIVE
Note: NEGATIVE

## 2020-03-04 LAB — RNA QUALITATIVE: HIV 1 RNA Qualitative: 1

## 2020-03-04 NOTE — Discharge Summary (Signed)
[facility] Discharge Summary   Patient: Alicia [name] QMV:[id number]  PCP: Rutherford Guys, MD  Date of admission: 02/27/2020   Date of discharge: 03/02/2020      Discharge Diagnoses:   Principal Problem:   Exacerbation of RAD (reactive airway disease) Active Problems:   Diabetes mellitus type 2 in obese ([place])   Hypertensive urgency   CKD (chronic kidney disease) stage 3, GFR 30-59 ml/min   Tobacco use   Hypertensive emergency   Admitted From: home Disposition:  Home   Recommendations for Outpatient Follow-up:  1. PCP: please follow up with PCP in 1week 2. Follow up LABS/TEST:  HIV testing,   Follow-up Information    [facility], [facility] Follow up.   Specialty: Home Health Services Why: HHRN/PT arranged Contact information: [address] [place] [address] [phone]          Diet recommendation: Cardiac diet  Activity: The patient is advised to gradually reintroduce usual activities, as tolerated  Discharge Condition: stable  Code Status: Full code   History of present illness: As per the H and P dictated on admission, "Alicia [name] is a 69 y.o. female with medical history significant of allergies, arthritis, chronic headaches, constipation, depression, non-insulin-dependent type 2 diabetes, hypertension, hyperlipidemia, obesity (BMI 39), PAD, CVA, CKD stage III, tobacco use presenting to the ED for evaluation of shortness of breath.  Patient reports 1 week history of progressively worsening dyspnea and wheezing.  She is coughing only a little.  Mostly having a lot of wheezing.  No fevers or chest pain.  States she had a Covid test yesterday which was negative.  Denies history of asthma or COPD.  She does not use any inhalers at home.  States she is not sure why her blood pressure is so high as she has been taking all of her home medications.  Reports taking enalapril but is not sure which other medications she  takes."  Hospital Course:  Summary of her active problems in the hospital is as following. 1.  Hypertensive emergency Noncompliance with medical regimen Acute pulmonary edema Acute on chronic diastolic CHF Presents with shortness of breath. Was hypotensive and hypoxic. Started on nicardipine drip. After that patient was started on her home regimen of blood pressure medications. Patient was also started on IV Lasix for diuresis. Currently renal function mildly worsening from baseline. Oxygenation improving but still has some wheezing. No further leg swelling. Echocardiogram shows preserved EF with diastolic dysfunction. We will discontinue enalapril and hydralazine and Imdur continue nifedipine and change metoprolol twice daily to Toprol-XL home regimen. Discontinue Aldactone at discharge. Monitor renal function.  2.  Acute on chronic kidney disease stage IIIb Renal function worsening from baseline likely in the setting of hypertensive urgency with hemodynamic injury as well as diuresis. Patient was also on ACE inhibitor. Currently holding ACE inhibitor. No further diuresis for now. Monitor renal function.  Avoid nephrotoxic medications.  3.  Concern for asthma. Appear to have cardiac wheezing.   4.  Positive HIV screen. - patient has not yet been notified of positive screen, will await further testing. - F/u HIV-1/2 Ab - F/u HIV-1 RNA  5.  Type 2 diabetes mellitus.  With renal complication. Blood sugars well controlled. Hemoglobin A1c 7.3. Monitor.  Continue sliding scale insulin.  6.  Elevated troponins. QT prolongation on EKG. No chest pain. Troponins mildly elevated not consistent with ACS. EKG negative for any acute ischemia. Prolonged QTC but echocardiogram shows negative abnormality.  QT prolongation likely in the setting of hypertension.  Monitor.  7.  Hyperlipidemia. Continuing home medication.  8.  Morbid obesity Patient may have undiagnosed sleep  apnea.  May benefit from outpatient sleep study. Body mass index is 40.38 kg/m.   Patient was seen by physical therapy, who recommended Home health, which was arranged. On the day of the discharge the patient's vitals were stable, and no other acute medical condition were reported by patient. the patient was felt safe to be discharge at Home with Home health.  Consultants: PCCM  Procedures: Echocardiogram   Discharge Exam: General: Appear in no distress, no Rash; Oral Mucosa Clear, moist. Cardiovascular: S1 and S2 Present, no Murmur, Respiratory: normal respiratory effort, Bilateral Air entry present and no Crackles, no wheezes Abdomen: Bowel Sound present, Soft and no tenderness, no hernia Extremities: no Pedal edema, no calf tenderness Neurology: alert and oriented to time, place, and person affect appropriate.  Filed Weights   02/29/20 0404 03/01/20 0622 03/02/20 0521  Weight: 118.7 kg 118.7 kg 118.7 kg   Vitals:   03/02/20 0754 03/02/20 1325  BP: (!) 155/80 (!) 148/80  Pulse: 88   Resp: (!) 22   Temp:    SpO2: 100%     DISCHARGE MEDICATION: Allergies as of 03/02/2020      Reactions   Atorvastatin Swelling   Lip swelling   Nifedipine Er Other (See Comments)   Caused nose bleeds in higher doses (90 mg., namely)   Latex Rash      Medication List    STOP taking these medications   bismuth-metronidazole-tetracycline 140-125-125 MG capsule Commonly known as: PYLERA   enalapril 20 MG tablet Commonly known as: VASOTEC   metFORMIN 500 MG tablet Commonly known as: GLUCOPHAGE   spironolactone 25 MG tablet Commonly known as: ALDACTONE     TAKE these medications   acetaminophen 500 MG tablet Commonly known as: TYLENOL Take 1,000 mg by mouth every 6 (six) hours as needed for moderate pain or headache.   aspirin 81 MG EC tablet Take 1 tablet (81 mg total) by mouth daily.   blood glucose meter kit and supplies per insurance preference. Check cbg once a day. Dx  E11.9   fluticasone 50 MCG/ACT nasal spray Commonly known as: FLONASE Place 1 spray into both nostrils 2 (two) times daily. What changed:   when to take this  reasons to take this   glimepiride 1 MG tablet Commonly known as: AMARYL Take 1 tablet (1 mg total) by mouth daily with breakfast.   hydrALAZINE 100 MG tablet Commonly known as: APRESOLINE Take 1 tablet (100 mg total) by mouth 3 (three) times daily.   isosorbide mononitrate 30 MG 24 hr tablet Commonly known as: IMDUR Take 1 tablet (30 mg total) by mouth daily.   metoprolol succinate 50 MG 24 hr tablet Commonly known as: TOPROL-XL Take 1 tablet (50 mg total) by mouth daily.   nicotine 14 mg/24hr patch Commonly known as: NICODERM CQ - dosed in mg/24 hours Place 1 patch (14 mg total) onto the skin daily.   NIFEdipine 60 MG 24 hr tablet Commonly known as: ADALAT CC Take 1 tablet (60 mg total) by mouth daily.   pantoprazole 40 MG tablet Commonly known as: PROTONIX Take 1 tablet (40 mg total) by mouth 2 (two) times daily for 10 days. Take 30-60 minutes before breakfast   polyethylene glycol 17 g packet Commonly known as: MIRALAX / GLYCOLAX Take 17 g by mouth daily as needed for moderate  constipation.   rosuvastatin 10 MG tablet Commonly known as: CRESTOR Take 1 tablet (10 mg total) by mouth daily.   sitaGLIPtin 50 MG tablet Commonly known as: JANUVIA Take 1 tablet (50 mg total) by mouth daily.      Allergies  Allergen Reactions  . Atorvastatin Swelling    Lip swelling  . Nifedipine Er Other (See Comments)    Caused nose bleeds in higher doses (90 mg., namely)  . Latex Rash     The results of significant diagnostics from this hospitalization (including imaging, microbiology, ancillary and laboratory) are listed below for reference.    Significant Diagnostic Studies: DG Chest 2 View  Result Date: 02/27/2020 CLINICAL DATA:  Shortness of breath EXAM: CHEST - 2 VIEW COMPARISON:  06/14/2018 FINDINGS:  Cardiomegaly. Small bilateral pleural effusions. Disc degenerative disease of the thoracic spine. Chronic fracture deformity proximal right humerus. IMPRESSION: Cardiomegaly and small bilateral pleural effusions. Electronically Signed   By: Eddie Candle M.D.   On: 02/27/2020 14:50   ECHOCARDIOGRAM COMPLETE  Result Date: 02/28/2020    ECHOCARDIOGRAM REPORT   Patient Name:   DEKISHA MESMER Date of Exam: 02/28/2020 Medical Rec #:  [id number]            Height:       67.0 in Accession #:    [id number]           Weight:       258.8 lb Date of Birth:  12/25/1950            BSA:          2.256 m Patient Age:    69 years             BP:           166/83 mmHg Patient Gender: F                    HR:           90 bpm. Exam Location:  Inpatient Procedure: 2D Echo Indications:    Congestive Heart Failure I50.9  History:        Patient has prior history of Echocardiogram examinations, most                 recent 03/16/2018. PAD; Risk Factors:Hypertension, Diabetes and                 Dyslipidemia.  Sonographer:    Mikki Santee RDCS (AE) Referring Phys: [place]  1. Left ventricular ejection fraction, by estimation, is 55 to 60%. The left ventricle has normal function. The left ventricle has no regional wall motion abnormalities. There is mild concentric left ventricular hypertrophy. Left ventricular diastolic parameters are consistent with Grade I diastolic dysfunction (impaired relaxation). Elevated left atrial pressure.  2. Right ventricular systolic function is normal. The right ventricular size is normal. There is mildly elevated pulmonary artery systolic pressure. The estimated right ventricular systolic pressure is 34.1 mmHg.  3. Left atrial size was mildly dilated.  4. Right atrial size was mildly dilated.  5. The mitral valve is normal in structure. No evidence of mitral valve regurgitation.  6. The aortic valve is normal in structure. Aortic valve regurgitation is mild to moderate.  Mild aortic valve sclerosis is present, with no evidence of aortic valve stenosis.  7. The inferior vena cava is normal in size with greater than 50% respiratory variability, suggesting right atrial pressure of 3 mmHg. Comparison(s): No significant  change from prior study. Prior images reviewed side by side. FINDINGS  Left Ventricle: Left ventricular ejection fraction, by estimation, is 55 to 60%. The left ventricle has normal function. The left ventricle has no regional wall motion abnormalities. The left ventricular internal cavity size was normal in size. There is  mild concentric left ventricular hypertrophy. Left ventricular diastolic parameters are consistent with Grade I diastolic dysfunction (impaired relaxation). Elevated left atrial pressure. Right Ventricle: The right ventricular size is normal. No increase in right ventricular wall thickness. Right ventricular systolic function is normal. There is mildly elevated pulmonary artery systolic pressure. The tricuspid regurgitant velocity is 2.84  m/s, and with an assumed right atrial pressure of 3 mmHg, the estimated right ventricular systolic pressure is 80.8 mmHg. Left Atrium: Left atrial size was mildly dilated. Right Atrium: Right atrial size was mildly dilated. Pericardium: A small pericardial effusion is present. The pericardial effusion is localized near the right atrium. Mitral Valve: The mitral valve is normal in structure. No evidence of mitral valve regurgitation. Tricuspid Valve: The tricuspid valve is normal in structure. Tricuspid valve regurgitation is mild. Aortic Valve: The aortic valve is normal in structure. Aortic valve regurgitation is mild to moderate. Aortic regurgitation PHT measures 387 msec. Mild aortic valve sclerosis is present, with no evidence of aortic valve stenosis. Pulmonic Valve: The pulmonic valve was not well visualized. Pulmonic valve regurgitation is not visualized. Aorta: The aortic root and ascending aorta are  structurally normal, with no evidence of dilitation. Venous: The inferior vena cava is normal in size with greater than 50% respiratory variability, suggesting right atrial pressure of 3 mmHg. IAS/Shunts: No atrial level shunt detected by color flow Doppler.  LEFT VENTRICLE PLAX 2D LVIDd:         4.76 cm      Diastology LVIDs:         3.70 cm      LV e' lateral:   3.57 cm/s LV PW:         1.33 cm      LV E/e' lateral: 28.0 LV IVS:        1.25 cm      LV e' medial:    4.79 cm/s LVOT diam:     2.20 cm      LV E/e' medial:  20.9 LV SV:         87 LV SV Index:   38 LVOT Area:     3.80 cm  LV Volumes (MOD) LV vol d, MOD A2C: 76.3 ml LV vol d, MOD A4C: 102.0 ml LV vol s, MOD A2C: 30.8 ml LV vol s, MOD A4C: 45.6 ml LV SV MOD A2C:     45.5 ml LV SV MOD A4C:     102.0 ml LV SV MOD BP:      50.3 ml RIGHT VENTRICLE RV S prime:     13.30 cm/s TAPSE (M-mode): 2.2 cm LEFT ATRIUM             Index       RIGHT ATRIUM           Index LA diam:        4.30 cm 1.91 cm/m  RA Area:     15.50 cm LA Vol (A2C):   87.6 ml 38.82 ml/m RA Volume:   30.50 ml  13.52 ml/m LA Vol (A4C):   79.2 ml 35.10 ml/m LA Biplane Vol: 84.6 ml 37.49 ml/m  AORTIC VALVE LVOT Vmax:   134.00 cm/s LVOT  Vmean:  91.900 cm/s LVOT VTI:    0.228 m AI PHT:      387 msec  AORTA Ao Root diam: 3.00 cm MITRAL VALVE                TRICUSPID VALVE MV Area (PHT): 4.60 cm     TR Peak grad:   32.3 mmHg MV Decel Time: 165 msec     TR Vmax:        284.00 cm/s MV E velocity: 100.00 cm/s MV A velocity: 131.00 cm/s  SHUNTS MV E/A ratio:  0.76         Systemic VTI:  0.23 m                             Systemic Diam: 2.20 cm Dani Gobble Croitoru MD Electronically signed by Sanda Klein MD Signature Date/Time: 02/28/2020/1:33:07 PM    Final    VAS [place] LOWER EXTREMITY VENOUS (DVT)  Result Date: 03/01/2020  Lower Venous DVTStudy Indications: Pain, and Swelling.  Comparison Study: No prior study Performing Technologist: Maudry Mayhew MHA, RDMS, RVT, RDCS  Examination Guidelines: A  complete evaluation includes B-mode imaging, spectral Doppler, color Doppler, and power Doppler as needed of all accessible portions of each vessel. Bilateral testing is considered an integral part of a complete examination. Limited examinations for reoccurring indications may be performed as noted. The reflux portion of the exam is performed with the patient in reverse Trendelenburg.  +-----+---------------+---------+-----------+----------+--------------+ RIGHTCompressibilityPhasicitySpontaneityPropertiesThrombus Aging +-----+---------------+---------+-----------+----------+--------------+ CFV  Full           Yes      Yes                                 +-----+---------------+---------+-----------+----------+--------------+   +---------+---------------+---------+-----------+----------+--------------+ LEFT     CompressibilityPhasicitySpontaneityPropertiesThrombus Aging +---------+---------------+---------+-----------+----------+--------------+ CFV      Full           Yes      Yes                                 +---------+---------------+---------+-----------+----------+--------------+ SFJ      Full                                                        +---------+---------------+---------+-----------+----------+--------------+ FV Prox  Full                                                        +---------+---------------+---------+-----------+----------+--------------+ FV Mid   Full                                                        +---------+---------------+---------+-----------+----------+--------------+ FV DistalFull                                                        +---------+---------------+---------+-----------+----------+--------------+  PFV      Full                                                        +---------+---------------+---------+-----------+----------+--------------+ POP      Full           Yes      Yes                                  +---------+---------------+---------+-----------+----------+--------------+ PTV      Full                                                        +---------+---------------+---------+-----------+----------+--------------+   Left Technical Findings: Not visualized segments include peroneal veins.   Summary: RIGHT: - No evidence of common femoral vein obstruction.  LEFT: - There is no evidence of deep vein thrombosis in the lower extremity.  - No cystic structure found in the popliteal fossa.  *See table(s) above for measurements and observations. Electronically signed by Servando Snare MD on 03/01/2020 at 1:44:13 PM.    Final     Microbiology: Recent Results (from the past 240 hour(s))  SARS CORONAVIRUS 2 (TAT 6-24 HRS) Nasopharyngeal Urine, Unspecified Source     Status: None   Collection Time: 02/27/20  7:13 PM   Specimen: Urine, Unspecified Source; Nasopharyngeal  Result Value Ref Range Status   SARS Coronavirus 2 NEGATIVE NEGATIVE Final    Comment: (NOTE) SARS-CoV-2 target nucleic acids are NOT DETECTED. The SARS-CoV-2 RNA is generally detectable in upper and lower respiratory specimens during the acute phase of infection. Negative results do not preclude SARS-CoV-2 infection, do not rule out co-infections with other pathogens, and should not be used as the sole basis for treatment or other patient management decisions. Negative results must be combined with clinical observations, patient history, and epidemiological information. The expected result is Negative. Fact Sheet for Patients: SugarRoll.be Fact Sheet for Healthcare Providers: https://www.woods-mathews.com/ This test is not yet approved or cleared by the [place] FDA and  has been authorized for detection and/or diagnosis of SARS-CoV-2 by FDA under an Emergency Use Authorization (EUA). This EUA will remain  in effect (meaning this test can be used) for the  duration of the COVID-19 declaration under Section 56 4(b)(1) of the Act, 21 U.S.C. section 360bbb-3(b)(1), unless the authorization is terminated or revoked sooner. Performed at [facility], [place] [address] [place], [place] [address]   Respiratory Panel by RT PCR (Flu A&B, Covid) - Nasopharyngeal Swab     Status: None   Collection Time: 02/27/20 11:31 PM   Specimen: Nasopharyngeal Swab  Result Value Ref Range Status   SARS Coronavirus 2 by RT PCR NEGATIVE NEGATIVE Final    Comment: (NOTE) SARS-CoV-2 target nucleic acids are NOT DETECTED. The SARS-CoV-2 RNA is generally detectable in upper respiratoy specimens during the acute phase of infection. The lowest concentration of SARS-CoV-2 viral copies this assay can detect is 131 copies/mL. A negative result does not preclude SARS-Cov-2 infection and should not be used as the sole basis for treatment or other patient  management decisions. A negative result may occur with  improper specimen collection/handling, submission of specimen other than nasopharyngeal swab, presence of viral mutation(s) within the areas targeted by this assay, and inadequate number of viral copies (<131 copies/mL). A negative result must be combined with clinical observations, patient history, and epidemiological information. The expected result is Negative. Fact Sheet for Patients:  PinkCheek.be Fact Sheet for Healthcare Providers:  GravelBags.it This test is not yet ap proved or cleared by the [place] FDA and  has been authorized for detection and/or diagnosis of SARS-CoV-2 by FDA under an Emergency Use Authorization (EUA). This EUA will remain  in effect (meaning this test can be used) for the duration of the COVID-19 declaration under Section 564(b)(1) of the Act, 21 U.S.C. section 360bbb-3(b)(1), unless the authorization is terminated or revoked sooner.    Influenza A by PCR NEGATIVE  NEGATIVE Final   Influenza B by PCR NEGATIVE NEGATIVE Final    Comment: (NOTE) The Xpert Xpress SARS-CoV-2/FLU/RSV assay is intended as an aid in  the diagnosis of influenza from Nasopharyngeal swab specimens and  should not be used as a sole basis for treatment. Nasal washings and  aspirates are unacceptable for Xpert Xpress SARS-CoV-2/FLU/RSV  testing. Fact Sheet for Patients: PinkCheek.be Fact Sheet for Healthcare Providers: GravelBags.it This test is not yet approved or cleared by the [place] FDA and  has been authorized for detection and/or diagnosis of SARS-CoV-2 by  FDA under an Emergency Use Authorization (EUA). This EUA will remain  in effect (meaning this test can be used) for the duration of the  Covid-19 declaration under Section 564(b)(1) of the Act, 21  U.S.C. section 360bbb-3(b)(1), unless the authorization is  terminated or revoked. Performed at [facility], [place] [address], [place], [place] [address]   MRSA PCR Screening     Status: None   Collection Time: 02/28/20  1:20 AM   Specimen: Nasal Mucosa; Nasopharyngeal  Result Value Ref Range Status   MRSA by PCR NEGATIVE NEGATIVE Final    Comment:        The GeneXpert MRSA Assay (FDA approved for NASAL specimens only), is one component of a comprehensive MRSA colonization surveillance program. It is not intended to diagnose MRSA infection nor to guide or monitor treatment for MRSA infections. Performed at [facility], [place] [address] [place], [place] [address]      Labs: CBC: Recent Labs  Lab 02/27/20 1121 02/27/20 1650 02/28/20 0347 03/01/20 0818  WBC 5.9  --  5.8 5.4  HGB 12.4 12.6 13.3 11.6*  HCT 41.4 37.0 41.6 37.8  MCV 96.3  --  92.4 94.3  PLT 270  --  349 536   Basic Metabolic Panel: Recent Labs  Lab 02/27/20 1121 02/27/20 1121 02/27/20 1650 02/27/20 2355 02/28/20 0347 02/29/20 0331 03/01/20 0818  03/02/20 0734  NA 142   < > 142  --  140 141 140 140  K 3.6   < > 3.7  --  4.5 3.8 4.1 4.1  CL 106  --   --   --  104 106 106 106  CO2 24  --   --   --  23 25 24 24   GLUCOSE 163*  --   --   --  222* 149* 176* 154*  BUN 14  --   --   --  19 29* 31* 32*  CREATININE 1.44*  --   --   --  1.54* 1.76* 1.95* 1.86*  CALCIUM 8.6*  --   --   --  8.7* 8.4* 8.1* 8.1*  MG  --   --   --  2.4 2.3  --   --   --   PHOS  --   --   --   --  4.0  --  4.3 4.1   < > = values in this interval not displayed.   Liver Function Tests: Recent Labs  Lab 03/01/20 0818 03/02/20 0734  ALBUMIN 2.9* 3.0*   No results for input(s): LIPASE, AMYLASE in the last 168 hours. No results for input(s): AMMONIA in the last 168 hours. Cardiac Enzymes: No results for input(s): CKTOTAL, CKMB, CKMBINDEX, TROPONINI in the last 168 hours. BNP (last 3 results) Recent Labs    02/27/20 1514  BNP 127.6*   CBG: Recent Labs  Lab 03/01/20 1201 03/01/20 1646 03/01/20 2056 03/02/20 0722 03/02/20 1108  GLUCAP 149* 205* 174* 160* 147*    Time spent: 35 minutes  Signed:  Berle Mull  [facility] 03/02/2020 8:42 AM

## 2020-03-05 DIAGNOSIS — I130 Hypertensive heart and chronic kidney disease with heart failure and stage 1 through stage 4 chronic kidney disease, or unspecified chronic kidney disease: Secondary | ICD-10-CM | POA: Diagnosis not present

## 2020-03-05 DIAGNOSIS — E1122 Type 2 diabetes mellitus with diabetic chronic kidney disease: Secondary | ICD-10-CM | POA: Diagnosis not present

## 2020-03-05 DIAGNOSIS — I509 Heart failure, unspecified: Secondary | ICD-10-CM | POA: Diagnosis not present

## 2020-03-05 DIAGNOSIS — F17210 Nicotine dependence, cigarettes, uncomplicated: Secondary | ICD-10-CM | POA: Diagnosis not present

## 2020-03-05 DIAGNOSIS — M47894 Other spondylosis, thoracic region: Secondary | ICD-10-CM | POA: Diagnosis not present

## 2020-03-05 DIAGNOSIS — E1151 Type 2 diabetes mellitus with diabetic peripheral angiopathy without gangrene: Secondary | ICD-10-CM | POA: Diagnosis not present

## 2020-03-05 DIAGNOSIS — N183 Chronic kidney disease, stage 3 unspecified: Secondary | ICD-10-CM | POA: Diagnosis not present

## 2020-03-05 DIAGNOSIS — I10 Essential (primary) hypertension: Secondary | ICD-10-CM | POA: Diagnosis not present

## 2020-03-05 DIAGNOSIS — M170 Bilateral primary osteoarthritis of knee: Secondary | ICD-10-CM | POA: Diagnosis not present

## 2020-03-05 DIAGNOSIS — J441 Chronic obstructive pulmonary disease with (acute) exacerbation: Secondary | ICD-10-CM | POA: Diagnosis not present

## 2020-03-10 ENCOUNTER — Telehealth: Payer: Self-pay | Admitting: Family Medicine

## 2020-03-10 DIAGNOSIS — M170 Bilateral primary osteoarthritis of knee: Secondary | ICD-10-CM | POA: Diagnosis not present

## 2020-03-10 DIAGNOSIS — I130 Hypertensive heart and chronic kidney disease with heart failure and stage 1 through stage 4 chronic kidney disease, or unspecified chronic kidney disease: Secondary | ICD-10-CM | POA: Diagnosis not present

## 2020-03-10 DIAGNOSIS — E1122 Type 2 diabetes mellitus with diabetic chronic kidney disease: Secondary | ICD-10-CM | POA: Diagnosis not present

## 2020-03-10 DIAGNOSIS — N183 Chronic kidney disease, stage 3 unspecified: Secondary | ICD-10-CM | POA: Diagnosis not present

## 2020-03-10 DIAGNOSIS — I509 Heart failure, unspecified: Secondary | ICD-10-CM | POA: Diagnosis not present

## 2020-03-10 DIAGNOSIS — M47894 Other spondylosis, thoracic region: Secondary | ICD-10-CM | POA: Diagnosis not present

## 2020-03-10 DIAGNOSIS — E1151 Type 2 diabetes mellitus with diabetic peripheral angiopathy without gangrene: Secondary | ICD-10-CM | POA: Diagnosis not present

## 2020-03-10 DIAGNOSIS — J441 Chronic obstructive pulmonary disease with (acute) exacerbation: Secondary | ICD-10-CM | POA: Diagnosis not present

## 2020-03-10 DIAGNOSIS — F17210 Nicotine dependence, cigarettes, uncomplicated: Secondary | ICD-10-CM | POA: Diagnosis not present

## 2020-03-10 NOTE — Telephone Encounter (Signed)
Please Advise

## 2020-03-10 NOTE — Telephone Encounter (Signed)
Alicia [name] from [facility] is calling and on her visit today  Patients BPwas  210/100 , pt is dizzy . And may need medication changes .  Please advise   Nicoles number is [phone]

## 2020-03-12 NOTE — Telephone Encounter (Signed)
Please call patient and see how she is doing. Make sure she is taking her meds as prescribed. Also please have her see me this week - ok to double book. thanks

## 2020-03-12 NOTE — Telephone Encounter (Signed)
Tried calling pt x2 no answer on either try, Pt does not appear to have VM set up phone continuously rang. Called Pt daughter and asked her to relay that we are trying to get into contact with her mother if she speaks with her, will try again later today.

## 2020-03-12 NOTE — Telephone Encounter (Signed)
Spoke to pt and she has been feeling very dizy and light headed states she has been taking her medication but doesn't feel its helping her at all. Moved pt appt up to Friday next week from the 17th.

## 2020-03-12 NOTE — Telephone Encounter (Signed)
Pt Returning a  Call she said she is ok and she taken most of her med.

## 2020-03-13 ENCOUNTER — Emergency Department (HOSPITAL_COMMUNITY)
Admission: EM | Admit: 2020-03-13 | Discharge: 2020-03-13 | Disposition: A | Discharge status: Home / Self Care | Payer: Medicare HMO | Attending: Emergency Medicine | Admitting: Emergency Medicine

## 2020-03-13 ENCOUNTER — Telehealth: Payer: Self-pay | Admitting: Family Medicine

## 2020-03-13 ENCOUNTER — Encounter (HOSPITAL_COMMUNITY): Payer: Self-pay | Admitting: Emergency Medicine

## 2020-03-13 ENCOUNTER — Emergency Department (HOSPITAL_COMMUNITY): Payer: Medicare HMO

## 2020-03-13 ENCOUNTER — Other Ambulatory Visit: Payer: Self-pay

## 2020-03-13 DIAGNOSIS — F17210 Nicotine dependence, cigarettes, uncomplicated: Secondary | ICD-10-CM | POA: Insufficient documentation

## 2020-03-13 DIAGNOSIS — R0602 Shortness of breath: Secondary | ICD-10-CM | POA: Insufficient documentation

## 2020-03-13 DIAGNOSIS — Z8673 Personal history of transient ischemic attack (TIA), and cerebral infarction without residual deficits: Secondary | ICD-10-CM | POA: Insufficient documentation

## 2020-03-13 DIAGNOSIS — R0902 Hypoxemia: Secondary | ICD-10-CM | POA: Diagnosis not present

## 2020-03-13 DIAGNOSIS — E119 Type 2 diabetes mellitus without complications: Secondary | ICD-10-CM | POA: Diagnosis not present

## 2020-03-13 DIAGNOSIS — I10 Essential (primary) hypertension: Secondary | ICD-10-CM | POA: Diagnosis not present

## 2020-03-13 DIAGNOSIS — R0789 Other chest pain: Secondary | ICD-10-CM | POA: Diagnosis not present

## 2020-03-13 LAB — COMPREHENSIVE METABOLIC PANEL
ALT: 22 U/L (ref 0–44)
AST: 21 U/L (ref 15–41)
Albumin: 3.2 g/dL — ABNORMAL LOW (ref 3.5–5.0)
Alkaline Phosphatase: 97 U/L (ref 38–126)
Anion gap: 8 (ref 5–15)
BUN: 11 mg/dL (ref 8–23)
CO2: 25 mmol/L (ref 22–32)
Calcium: 8.4 mg/dL — ABNORMAL LOW (ref 8.9–10.3)
Chloride: 109 mmol/L (ref 98–111)
Creatinine, Ser: 1.4 mg/dL — ABNORMAL HIGH (ref 0.44–1.00)
GFR calc Af Amer: 44 mL/min — ABNORMAL LOW (ref >60)
GFR calc non Af Amer: 38 mL/min — ABNORMAL LOW (ref >60)
Glucose, Bld: 95 mg/dL (ref 70–99)
Potassium: 3.9 mmol/L (ref 3.5–5.1)
Sodium: 142 mmol/L (ref 135–145)
Total Bilirubin: 1.1 mg/dL (ref 0.3–1.2)
Total Protein: 6.8 g/dL (ref 6.5–8.1)

## 2020-03-13 LAB — CBC
HCT: 37.6 % (ref 36.0–46.0)
Hemoglobin: 11.6 g/dL — ABNORMAL LOW (ref 12.0–15.0)
MCH: 29 pg (ref 26.0–34.0)
MCHC: 30.9 g/dL (ref 30.0–36.0)
MCV: 94 fL (ref 80.0–100.0)
Platelets: 349 10*3/uL (ref 150–400)
RBC: 4 MIL/uL (ref 3.87–5.11)
RDW: 14.5 % (ref 11.5–15.5)
WBC: 6.6 10*3/uL (ref 4.0–10.5)
nRBC: 0 % (ref 0.0–0.2)

## 2020-03-13 LAB — BRAIN NATRIURETIC PEPTIDE: B Natriuretic Peptide: 346.7 pg/mL — ABNORMAL HIGH (ref 0.0–100.0)

## 2020-03-13 LAB — TROPONIN I (HIGH SENSITIVITY)
Troponin I (High Sensitivity): 23 ng/L — ABNORMAL HIGH (ref <18)
Troponin I (High Sensitivity): 26 ng/L — ABNORMAL HIGH (ref <18)

## 2020-03-13 MED ORDER — METOPROLOL SUCCINATE ER 50 MG PO TB24
50.0000 mg | ORAL_TABLET | Freq: Every day | ORAL | 1 refills | Status: DC
Start: 2020-03-13 — End: 2020-03-20

## 2020-03-13 MED ORDER — METOPROLOL TARTRATE 25 MG PO TABS
50.0000 mg | ORAL_TABLET | Freq: Once | ORAL | Status: AC
  Administered 2020-03-13: 50 mg via ORAL
  Filled 2020-03-13: qty 2

## 2020-03-13 NOTE — ED Provider Notes (Signed)
[facility] Emergency Department Provider Note MRN:  [id number]  Arrival date & time: 03/13/20     Chief Complaint   Shortness of Breath and Hypertension   History of Present Illness   Alicia [name] is a 69 y.o. year-old female with a history of hypertension, diabetes, CKD, CHF, COPD presenting to the ED with chief complaint of shortness of breath.  Patient has misplaced her metoprolol and has not taken it in several days.  Has been having worsening shortness of breath for the past 3 to 4 days, was noted to be especially dyspneic on exertion with physical therapy today and the physical therapist was concerned about patient's high blood pressure, 210/110 reportedly during their session.  Sent here for evaluation.  She endorses occasional chest discomfort which is very mild, not currently.  No abdominal pain, no fever, dry cough which is common for her, no leg pain or swelling.  Review of Systems  A complete 10 system review of systems was obtained and all systems are negative except as noted in the HPI and PMH.   Patient's Health History    Past Medical History:  Diagnosis Date  . Allergy   . Arthritis   . Chronic headaches   . Constipation   . Depression   . Diabetes mellitus   . Hyperlipidemia   . Hypertension   . Obesity   . PAD (peripheral artery disease) ([place])   . Shortness of breath   . Stroke [facility])     Past Surgical History:  Procedure Laterality Date  . ABDOMINAL AORTOGRAM W/LOWER EXTREMITY N/A 12/17/2018   Procedure: ABDOMINAL AORTOGRAM W/LOWER EXTREMITY;  Surgeon: Waynetta Sandy, MD;  Location: [place] [facility];  Service: Cardiovascular;  Laterality: N/A;  . FEMORAL BYPASS  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 Bridgett Larsson  . PERIPHERAL VASCULAR ATHERECTOMY Left 12/17/2018   Procedure: PERIPHERAL VASCULAR ATHERECTOMY;  Surgeon: Waynetta Sandy, MD;  Location: [place] [facility];  Service:  Cardiovascular;  Laterality: Left;  . PERIPHERAL VASCULAR BALLOON ANGIOPLASTY Left 12/17/2018   Procedure: PERIPHERAL VASCULAR BALLOON ANGIOPLASTY;  Surgeon: Waynetta Sandy, MD;  Location: [place] [facility];  Service: Cardiovascular;  Laterality: Left;    Family History  Problem Relation Age of Onset  . Diabetes Father   . Heart disease Mother        NOT before age 52-  Bypass  . Hypertension Mother   . Hypertension Sister   . Varicose Veins Brother   . Heart disease Brother        Before age 71  . Hypertension Daughter   . Colon cancer Neg Hx   . Esophageal cancer Neg Hx   . Rectal cancer Neg Hx   . Stomach cancer Neg Hx     Social History   Socioeconomic History  . Marital status: Single    Spouse name: Not on file  . Number of children: Not on file  . Years of education: Not on file  . Highest education level: Not on file  Occupational History  . Not on file  Tobacco Use  . Smoking status: Light Tobacco Smoker    Packs/day: 0.50    Years: 35.00    Pack years: 17.50    Types: Cigarettes  . Smokeless tobacco: Never Used  . Tobacco comment: patient is trying to quit and has gone down to half a pack per day  Substance and Sexual Activity  .  Alcohol use: Yes    Alcohol/week: 1.0 standard drinks    Types: 1 Standard drinks or equivalent per week    Comment: occasional use only  . Drug use: No  . Sexual activity: Not Currently  Other Topics Concern  . Not on file  Social History Narrative  . Not on file   Social Determinants of Health   Financial Resource Strain:   . Difficulty of Paying Living Expenses:   Food Insecurity:   . Worried About Charity fundraiser in the Last Year:   . Arboriculturist in the Last Year:   Transportation Needs:   . Film/video editor (Medical):   Marland Kitchen Lack of Transportation (Non-Medical):   Physical Activity:   . Days of Exercise per Week:   . Minutes of Exercise per Session:   Stress:   . Feeling of Stress :     Social Connections:   . Frequency of Communication with Friends and Family:   . Frequency of Social Gatherings with Friends and Family:   . Attends Religious Services:   . Active Member of Clubs or Organizations:   . Attends Archivist Meetings:   Marland Kitchen Marital Status:   Intimate Partner Violence:   . Fear of Current or Ex-Partner:   . Emotionally Abused:   Marland Kitchen Physically Abused:   . Sexually Abused:      Physical Exam   Vitals:   03/13/20 1630 03/13/20 1700  BP: (!) 190/90 (!) 181/96  Pulse: 64 63  Resp: (!) 22 13  Temp:    SpO2: 93% 94%    CONSTITUTIONAL: Well-appearing, NAD NEURO:  Alert and oriented x 3, no focal deficits EYES:  eyes equal and reactive ENT/NECK:  no LAD, no JVD CARDIO: Regular rate, well-perfused, normal S1 and S2 PULM:  CTAB no wheezing or rhonchi GI/GU:  normal bowel sounds, non-distended, non-tender MSK/SPINE:  No gross deformities, no edema SKIN:  no rash, atraumatic PSYCH:  Appropriate speech and behavior  *Additional and/or pertinent findings included in MDM below  Diagnostic and Interventional Summary    EKG Interpretation  Date/Time:  Friday March 13 2020 15:12:17 EDT Ventricular Rate:  89 PR Interval:    QRS Duration: 100 QT Interval:  418 QTC Calculation: 509 R Axis:   -14 Text Interpretation: Sinus rhythm Multiple ventricular premature complexes Probable left atrial enlargement Left ventricular hypertrophy Prolonged QT interval Confirmed by Gerlene Fee [phone]) on 03/13/2020 4:14:45 PM      Labs Reviewed  CBC - Abnormal; Notable for the following components:      Result Value   Hemoglobin 11.6 (*)    All other components within normal limits  COMPREHENSIVE METABOLIC PANEL - Abnormal; Notable for the following components:   Creatinine, Ser 1.40 (*)    Calcium 8.4 (*)    Albumin 3.2 (*)    GFR calc non Af Amer 38 (*)    GFR calc Af Amer 44 (*)    All other components within normal limits  BRAIN NATRIURETIC PEPTIDE -  Abnormal; Notable for the following components:   B Natriuretic Peptide 346.7 (*)    All other components within normal limits  TROPONIN I (HIGH SENSITIVITY) - Abnormal; Notable for the following components:   Troponin I (High Sensitivity) 23 (*)    All other components within normal limits  TROPONIN I (HIGH SENSITIVITY) - Abnormal; Notable for the following components:   Troponin I (High Sensitivity) 26 (*)    All other components within normal  limits    DG Chest Port 1 View  Final Result      Medications  metoprolol tartrate (LOPRESSOR) tablet 50 mg (50 mg Oral Given 03/13/20 1513)     Procedures  /  Critical Care Procedures  ED Course and Medical Decision Making  I have reviewed the triage vital signs, the nursing notes, and pertinent available records from the EMR.  Listed above are laboratory and imaging tests that I personally ordered, reviewed, and interpreted and then considered in my medical decision making (see below for details).      Suspect mild CHF exacerbation due to hypertension increased afterload related to medication noncompliance.  Patient was reportedly on 2 L in route with EMS but is currently on room air satting 95%.  Mildly tachypneic but no increased work of breathing, still hypertensive, will give her 50 mg metoprolol home dose, awaiting evaluation with labs and chest x-ray.  Work-up is overall reassuring, minimally elevated troponin that is flat upon repeat.  Minimally elevated BNP, chest x-ray largely unchanged.  Patient continues to be on room air satting well with normal vital signs, initial hypertension improving after home dose of metoprolol.  No evidence of DVT, doubt PE or other emergent process.  Appropriate for discharge with close PCP follow-up.  Barth Kirks. Sedonia Small, MD [place] [email or web address]  Final Clinical Impressions(s) / ED Diagnoses     ICD-10-CM   1. Shortness of breath  R06.02   2.  Hypertension, unspecified type  I10     ED Discharge Orders         Ordered    metoprolol succinate (TOPROL XL) 50 MG 24 hr tablet  Daily     03/13/20 1817           Discharge Instructions Discussed with and Provided to Patient:     Discharge Instructions     You were evaluated in the Emergency Department and after careful evaluation, we did not find any emergent condition requiring admission or further testing in the hospital.  Your exam/testing today was overall reassuring.  Your symptoms seem to be due to a mild amount of heart strain due to high blood pressure.  We are refilling your metoprolol medication, please continue to take it and your other medications and follow-up closely with your regular doctor.  Please return to the Emergency Department if you experience any worsening of your condition.  We encourage you to follow up with a primary care provider.  Thank you for allowing [place] to be a part of your care.        Maudie Flakes, MD 03/13/20 [phone]

## 2020-03-13 NOTE — ED Triage Notes (Addendum)
Pt BIB [facility] from home. PT called d/t BP reading 210/110. Hx of CHF and HTN. Prescribed metoprolol but has not been taking for about a month because she ran out. Pt denies any pain, reports it is hard to breath. EMS o2 reading 93%, started 2L [place] en route. Pt o2 reading 95% on RA on arrival.

## 2020-03-13 NOTE — Telephone Encounter (Signed)
Tried calling again with no answer

## 2020-03-13 NOTE — Discharge Instructions (Addendum)
You were evaluated in the Emergency Department and after careful evaluation, we did not find any emergent condition requiring admission or further testing in the hospital.  Your exam/testing today was overall reassuring.  Your symptoms seem to be due to a mild amount of heart strain due to high blood pressure.  We are refilling your metoprolol medication, please continue to take it and your other medications and follow-up closely with your regular doctor.  Please return to the Emergency Department if you experience any worsening of your condition.  We encourage you to follow up with a primary care provider.  Thank you for allowing [place] to be a part of your care.

## 2020-03-13 NOTE — Telephone Encounter (Signed)
Several attempts to call, pt needs an appointment, and Dr. Pamella Pert HAS to speak with her if she gets on the phone with someone in the facility.

## 2020-03-13 NOTE — Telephone Encounter (Signed)
Rob in home nurse calling to report patients  BP 218 over 90 . Does she need med changes ? Also would like to get an order verbal or faxed for social worker   Please call Tomasita Crumble at [phone]  Also ok to call patient

## 2020-03-17 DIAGNOSIS — I509 Heart failure, unspecified: Secondary | ICD-10-CM | POA: Diagnosis not present

## 2020-03-17 DIAGNOSIS — J441 Chronic obstructive pulmonary disease with (acute) exacerbation: Secondary | ICD-10-CM | POA: Diagnosis not present

## 2020-03-17 DIAGNOSIS — F17210 Nicotine dependence, cigarettes, uncomplicated: Secondary | ICD-10-CM | POA: Diagnosis not present

## 2020-03-17 DIAGNOSIS — N183 Chronic kidney disease, stage 3 unspecified: Secondary | ICD-10-CM | POA: Diagnosis not present

## 2020-03-17 DIAGNOSIS — E1122 Type 2 diabetes mellitus with diabetic chronic kidney disease: Secondary | ICD-10-CM | POA: Diagnosis not present

## 2020-03-17 DIAGNOSIS — E1151 Type 2 diabetes mellitus with diabetic peripheral angiopathy without gangrene: Secondary | ICD-10-CM | POA: Diagnosis not present

## 2020-03-17 DIAGNOSIS — M170 Bilateral primary osteoarthritis of knee: Secondary | ICD-10-CM | POA: Diagnosis not present

## 2020-03-17 DIAGNOSIS — I130 Hypertensive heart and chronic kidney disease with heart failure and stage 1 through stage 4 chronic kidney disease, or unspecified chronic kidney disease: Secondary | ICD-10-CM | POA: Diagnosis not present

## 2020-03-17 DIAGNOSIS — M47894 Other spondylosis, thoracic region: Secondary | ICD-10-CM | POA: Diagnosis not present

## 2020-03-20 ENCOUNTER — Encounter: Payer: Self-pay | Admitting: Family Medicine

## 2020-03-20 ENCOUNTER — Ambulatory Visit (INDEPENDENT_AMBULATORY_CARE_PROVIDER_SITE_OTHER): Payer: Medicare HMO | Admitting: Family Medicine

## 2020-03-20 ENCOUNTER — Other Ambulatory Visit: Payer: Self-pay

## 2020-03-20 VITALS — BP 170/78 | HR 105 | Temp 98.0°F | Ht 67.5 in | Wt 263.0 lb

## 2020-03-20 DIAGNOSIS — Z720 Tobacco use: Secondary | ICD-10-CM | POA: Diagnosis not present

## 2020-03-20 DIAGNOSIS — N1831 Chronic kidney disease, stage 3a: Secondary | ICD-10-CM | POA: Diagnosis not present

## 2020-03-20 DIAGNOSIS — I5032 Chronic diastolic (congestive) heart failure: Secondary | ICD-10-CM

## 2020-03-20 DIAGNOSIS — I160 Hypertensive urgency: Secondary | ICD-10-CM | POA: Diagnosis not present

## 2020-03-20 MED ORDER — FLUTICASONE PROPIONATE 50 MCG/ACT NA SUSP: 1.0000 | Freq: Two times a day (BID) | NASAL | 3 refills | Status: DC | PRN

## 2020-03-20 MED ORDER — ROSUVASTATIN CALCIUM 10 MG PO TABS: 10.0000 mg | ORAL_TABLET | Freq: Every day | ORAL | 3 refills | Status: DC

## 2020-03-20 MED ORDER — FUROSEMIDE 20 MG PO TABS: 20.0000 mg | ORAL_TABLET | Freq: Every day | ORAL | 3 refills | Status: DC

## 2020-03-20 MED ORDER — METOPROLOL SUCCINATE ER 50 MG PO TB24: 50.0000 mg | ORAL_TABLET | Freq: Every day | ORAL | 1 refills | Status: DC

## 2020-03-20 NOTE — Patient Instructions (Signed)
° ° ° °  If you have lab work done today you will be contacted with your lab results within the next 2 weeks.  If you have not heard from us then please contact us. The fastest way to get your results is to register for My Chart. ° ° °IF you received an x-ray today, you will receive an invoice from [facility]. Please contact [facility] at [phone] with questions or concerns regarding your invoice.  ° °IF you received labwork today, you will receive an invoice from [facility]. Please contact [facility] at [phone] with questions or concerns regarding your invoice.  ° °Our billing staff will not be able to assist you with questions regarding bills from these companies. ° °You will be contacted with the lab results as soon as they are available. The fastest way to get your results is to activate your My Chart account. Instructions are located on the last page of this paperwork. If you have not heard from us regarding the results in 2 weeks, please contact this office. °  ° ° ° °

## 2020-03-20 NOTE — Progress Notes (Signed)
4/30/202111:04 AM  Alicia [name] 1951-09-17, 69 y.o., female [id number]  Chief Complaint  Patient presents with  . Hypertension    SOB w/ exertion   . Nicotine Dependence    HPI:   Patient is a 70 y.o. female with past medical history significant for HTN, DM2, HLP, PAD w hx L fem-pop bypass, CVA, dCHF, smoker, diverticulitis who presents today for hosp followup  Last OV feb 2021 hosp 4/8 - 4/12 for exacerbation of RAD, HTN urgency, non compliance of medical regime, acute pulmonary edema, acute on chronic dCHF, acute on chronic kidney disease, cardiac wheezing - started of nicardipine ggt, resumed home bp meds, echo normal LVEF with dd Changed metoprolol BID to XL, d/c aldoctone (worsening crt), started on nicotine patch, home health per PT   ER 4/23 - SOB, HTN, not taking her metoprolol  She reports that she is feeling a bit better, breathing a bit more comfortable, no wheezing having swelling in her feet, no chest pain, no headache, no vision changes, dizziness at baseline Lives alone, has been eating lots of canned soups Here with daughter Smokes about 1/2 ppd Was started on nicotine patch in the hospital  She needs a rx for nicotine patch She has not seen cards in over a year  Wt Readings from Last 3 Encounters:  03/20/20 263 lb (119.3 kg)  03/13/20 261 lb 11 oz (118.7 kg)  03/02/20 261 lb 11 oz (118.7 kg)    Lab Results  Component Value Date   HGBA1C 7.3 (A) 01/06/2020   HGBA1C 7.5 (A) 02/07/2019   HGBA1C 7.1 (H) 10/09/2018   Lab Results  Component Value Date   LDLCALC 133 (H) 02/29/2020   CREATININE 1.40 (H) 03/13/2020   Lab Results  Component Value Date   CREATININE 1.40 (H) 03/13/2020   CREATININE 1.86 (H) 03/02/2020   CREATININE 1.95 (H) 03/01/2020   Lab Results  Component Value Date   CREATININE 1.40 (H) 03/13/2020   BUN 11 03/13/2020   NA 142 03/13/2020   K 3.9 03/13/2020   CL 109 03/13/2020   CO2 25 03/13/2020    Depression screen  PHQ 2/9 03/20/2020 01/06/2020 05/10/2019  Decreased Interest 0 0 0  Down, Depressed, Hopeless 0 0 0  PHQ - 2 Score 0 0 0    Fall Risk  03/20/2020 01/06/2020 05/10/2019 05/10/2019 02/07/2019  Falls in the past year? 0 0 0 0 0  Number falls in past yr: 0 0 0 0 0  Injury with Fall? 0 0 0 0 0  Follow up Falls evaluation completed - - - -     Allergies  Allergen Reactions  . Atorvastatin Swelling    Lip swelling  . Nifedipine Er Other (See Comments)    Caused nose bleeds in higher doses (90 mg., namely)  . Latex Rash    Prior to Admission medications   Medication Sig Start Date End Date Taking? Authorizing Provider  acetaminophen (TYLENOL) 500 MG tablet Take 1,000 mg by mouth every 6 (six) hours as needed for moderate pain or headache.    Yes [provider]  aspirin 81 MG EC tablet Take 1 tablet (81 mg total) by mouth daily. 04/06/18  Yes Rutherford Guys, MD  blood glucose meter kit and supplies per insurance preference. Check cbg once a day. Dx E11.9 01/06/20  Yes Rutherford Guys, MD  fluticasone [facility]) 50 MCG/ACT nasal spray Place 1 spray into both nostrils 2 (two) times daily. Patient taking differently: Place 1  spray into both nostrils 2 (two) times daily as needed for allergies or rhinitis.  06/21/18  Yes Rutherford Guys, MD  glimepiride (AMARYL) 1 MG tablet Take 1 tablet (1 mg total) by mouth daily with breakfast. 01/18/20  Yes Rutherford Guys, MD  hydrALAZINE (APRESOLINE) 100 MG tablet Take 1 tablet (100 mg total) by mouth 3 (three) times daily. 03/02/20  Yes Lavina Hamman, MD  isosorbide mononitrate (IMDUR) 30 MG 24 hr tablet Take 1 tablet (30 mg total) by mouth daily. 03/03/20  Yes Lavina Hamman, MD  metoprolol succinate (TOPROL-XL) 50 MG 24 hr tablet Take 1 tablet (50 mg total) by mouth daily. 01/06/20  Yes Rutherford Guys, MD  NIFEdipine (ADALAT CC) 60 MG 24 hr tablet Take 1 tablet (60 mg total) by mouth daily. 01/06/20  Yes Rutherford Guys, MD  polyethylene glycol  (MIRALAX / GLYCOLAX) 17 g packet Take 17 g by mouth daily as needed for moderate constipation. 03/02/20  Yes Lavina Hamman, MD  sitaGLIPtin (JANUVIA) 50 MG tablet Take 1 tablet (50 mg total) by mouth daily. 01/06/20  Yes Rutherford Guys, MD  metoprolol succinate (TOPROL XL) 50 MG 24 hr tablet Take 1 tablet (50 mg total) by mouth daily. Take with or immediately following a meal. Patient not taking: Reported on 03/20/2020 03/13/20   Maudie Flakes, MD  nicotine (NICODERM CQ - DOSED IN MG/24 HOURS) 14 mg/24hr patch Place 1 patch (14 mg total) onto the skin daily. Patient not taking: Reported on 03/20/2020 03/03/20   Lavina Hamman, MD  pantoprazole (PROTONIX) 40 MG tablet Take 1 tablet (40 mg total) by mouth 2 (two) times daily for 10 days. Take 30-60 minutes before breakfast Patient not taking: Reported on 03/13/2020 09/06/19 09/16/19  Levin Erp, PA  rosuvastatin (CRESTOR) 10 MG tablet Take 1 tablet (10 mg total) by mouth daily. Patient not taking: Reported on 03/20/2020 01/18/20   Rutherford Guys, MD    Past Medical History:  Diagnosis Date  . Allergy   . Arthritis   . Chronic headaches   . Constipation   . Depression   . Diabetes mellitus   . Hyperlipidemia   . Hypertension   . Obesity   . PAD (peripheral artery disease) ([place])   . Shortness of breath   . Stroke [facility])     Past Surgical History:  Procedure Laterality Date  . ABDOMINAL AORTOGRAM W/LOWER EXTREMITY N/A 12/17/2018   Procedure: ABDOMINAL AORTOGRAM W/LOWER EXTREMITY;  Surgeon: Waynetta Sandy, MD;  Location: [place] [facility];  Service: Cardiovascular;  Laterality: N/A;  . FEMORAL BYPASS  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 Bridgett Larsson  . PERIPHERAL VASCULAR ATHERECTOMY Left 12/17/2018   Procedure: PERIPHERAL VASCULAR ATHERECTOMY;  Surgeon: Waynetta Sandy, MD;  Location: [place] [facility];  Service: Cardiovascular;  Laterality: Left;  . PERIPHERAL VASCULAR BALLOON  ANGIOPLASTY Left 12/17/2018   Procedure: PERIPHERAL VASCULAR BALLOON ANGIOPLASTY;  Surgeon: Waynetta Sandy, MD;  Location: [place] [facility];  Service: Cardiovascular;  Laterality: Left;    Social History   Tobacco Use  . Smoking status: Light Tobacco Smoker    Packs/day: 0.50    Years: 35.00    Pack years: 17.50    Types: Cigarettes  . Smokeless tobacco: Never Used  . Tobacco comment: patient is trying to quit and has gone down to half a pack per day  Substance Use Topics  . Alcohol  use: Yes    Alcohol/week: 1.0 standard drinks    Types: 1 Standard drinks or equivalent per week    Comment: occasional use only    Family History  Problem Relation Age of Onset  . Diabetes Father   . Heart disease Mother        NOT before age 55-  Bypass  . Hypertension Mother   . Hypertension Sister   . Varicose Veins Brother   . Heart disease Brother        Before age 8  . Hypertension Daughter   . Colon cancer Neg Hx   . Esophageal cancer Neg Hx   . Rectal cancer Neg Hx   . Stomach cancer Neg Hx     ROS Per hpi  OBJECTIVE:  Today's Vitals   03/20/20 1104 03/20/20 1108  BP: (!) 196/82 (!) 170/78  Pulse: (!) 105   Temp: 98 F (36.7 C)   SpO2: 93%   Weight: 263 lb (119.3 kg)   Height: 5' 7.5" (1.715 m)    Body mass index is 40.58 kg/m.   Physical Exam Vitals and nursing note reviewed.  Constitutional:      Appearance: She is well-developed.  HENT:     Head: Normocephalic and atraumatic.     Mouth/Throat:     Pharynx: No oropharyngeal exudate.  Eyes:     General: No scleral icterus.    Conjunctiva/sclera: Conjunctivae normal.     Pupils: Pupils are equal, round, and reactive to light.  Cardiovascular:     Rate and Rhythm: Normal rate and regular rhythm.     Heart sounds: Normal heart sounds. No murmur. No friction rub. No gallop.   Pulmonary:     Effort: Pulmonary effort is normal.     Breath sounds: Normal breath sounds. No wheezing or rales.    Musculoskeletal:     Cervical back: Neck supple.     Right lower leg: Edema (+ 1 pitting edema) present.     Left lower leg: Edema present.  Lymphadenopathy:     Cervical: No cervical adenopathy.  Skin:    General: Skin is warm and dry.  Neurological:     Mental Status: She is alert and oriented to person, place, and time.     No results found for this or any previous visit (from the past 24 hour(s)).  No results found.   ASSESSMENT and PLAN  1. Hypertensive urgency 2. Chronic diastolic congestive heart failure ([place]) Not controlled. Discussed importance of med compliance, start metoprolol as rx, starting lasix today given edema and mild weight gain. Discussed low salt diet. Recheck BMP at next OV. Declines referral to cards at this time.   3. Stage 3a chronic kidney disease crt back to baseline  4. Tobacco use rx for patch given. Advised if not covered, available OTC.   Other orders - fluticasone (FLONASE) 50 MCG/ACT nasal spray; Place 1 spray into both nostrils 2 (two) times daily as needed for allergies or rhinitis. - metoprolol succinate (TOPROL XL) 50 MG 24 hr tablet; Take 1 tablet (50 mg total) by mouth daily. Take with or immediately following a meal. - rosuvastatin (CRESTOR) 10 MG tablet; Take 1 tablet (10 mg total) by mouth daily. - furosemide (LASIX) 20 MG tablet; Take 1 tablet (20 mg total) by mouth daily.  Return in about 1 week (around 03/27/2020).    Rutherford Guys, MD [facility] [place], [place] [address] Ph.  [phone] Fax [phone]

## 2020-03-23 DIAGNOSIS — M170 Bilateral primary osteoarthritis of knee: Secondary | ICD-10-CM | POA: Diagnosis not present

## 2020-03-23 DIAGNOSIS — E1151 Type 2 diabetes mellitus with diabetic peripheral angiopathy without gangrene: Secondary | ICD-10-CM | POA: Diagnosis not present

## 2020-03-23 DIAGNOSIS — I509 Heart failure, unspecified: Secondary | ICD-10-CM | POA: Diagnosis not present

## 2020-03-23 DIAGNOSIS — M47894 Other spondylosis, thoracic region: Secondary | ICD-10-CM | POA: Diagnosis not present

## 2020-03-23 DIAGNOSIS — F17210 Nicotine dependence, cigarettes, uncomplicated: Secondary | ICD-10-CM | POA: Diagnosis not present

## 2020-03-23 DIAGNOSIS — E1122 Type 2 diabetes mellitus with diabetic chronic kidney disease: Secondary | ICD-10-CM | POA: Diagnosis not present

## 2020-03-23 DIAGNOSIS — N183 Chronic kidney disease, stage 3 unspecified: Secondary | ICD-10-CM | POA: Diagnosis not present

## 2020-03-23 DIAGNOSIS — I130 Hypertensive heart and chronic kidney disease with heart failure and stage 1 through stage 4 chronic kidney disease, or unspecified chronic kidney disease: Secondary | ICD-10-CM | POA: Diagnosis not present

## 2020-03-23 DIAGNOSIS — J441 Chronic obstructive pulmonary disease with (acute) exacerbation: Secondary | ICD-10-CM | POA: Diagnosis not present

## 2020-03-31 ENCOUNTER — Ambulatory Visit (INDEPENDENT_AMBULATORY_CARE_PROVIDER_SITE_OTHER): Payer: Medicare HMO | Admitting: Family Medicine

## 2020-03-31 ENCOUNTER — Encounter: Payer: Self-pay | Admitting: Family Medicine

## 2020-03-31 ENCOUNTER — Other Ambulatory Visit: Payer: Self-pay

## 2020-03-31 ENCOUNTER — Telehealth: Payer: Self-pay

## 2020-03-31 VITALS — BP 162/73 | HR 79 | Temp 97.8°F | Ht 67.5 in | Wt 263.0 lb

## 2020-03-31 DIAGNOSIS — I10 Essential (primary) hypertension: Secondary | ICD-10-CM | POA: Diagnosis not present

## 2020-03-31 DIAGNOSIS — I5032 Chronic diastolic (congestive) heart failure: Secondary | ICD-10-CM

## 2020-03-31 DIAGNOSIS — R0902 Hypoxemia: Secondary | ICD-10-CM

## 2020-03-31 DIAGNOSIS — N1831 Chronic kidney disease, stage 3a: Secondary | ICD-10-CM

## 2020-03-31 DIAGNOSIS — R269 Unspecified abnormalities of gait and mobility: Secondary | ICD-10-CM

## 2020-03-31 LAB — BASIC METABOLIC PANEL
BUN/Creatinine Ratio: 12 (ref 12–28)
BUN: 18 mg/dL (ref 8–27)
CO2: 23 mmol/L (ref 20–29)
Calcium: 8.8 mg/dL (ref 8.7–10.3)
Chloride: 106 mmol/L (ref 96–106)
Creatinine, Ser: 1.54 mg/dL — ABNORMAL HIGH (ref 0.57–1.00)
GFR calc Af Amer: 39 mL/min/{1.73_m2} — ABNORMAL LOW (ref >59)
GFR calc non Af Amer: 34 mL/min/{1.73_m2} — ABNORMAL LOW (ref >59)
Glucose: 141 mg/dL — ABNORMAL HIGH (ref 65–99)
Potassium: 3.7 mmol/L (ref 3.5–5.2)
Sodium: 145 mmol/L — ABNORMAL HIGH (ref 134–144)

## 2020-03-31 MED ORDER — FUROSEMIDE 40 MG PO TABS: 40.0000 mg | ORAL_TABLET | Freq: Every day | ORAL | 1 refills | Status: DC

## 2020-03-31 NOTE — Telephone Encounter (Signed)
Orders faxed to [facility] for o2

## 2020-03-31 NOTE — Progress Notes (Addendum)
5/11/202111:51 AM  Arta Bruce 1951/03/03, 69 y.o., female [id number]  Chief Complaint  Patient presents with  . Hypertension    follow up  . fatigued    sob all the time and worse with activity , lightheaded  . Foot Swelling    hand and face swelling past days    HPI:   Patient is a 69 y.o. female with past medical history significant for HTN, DM2, HLP, PAD w hx L fem-pop bypass, CVA, dCHF, smoker, diverticulitiswho presents today for hosp followup  Last OV 2 weeks ago - discussed compliance with meds, start metorpolol as rx, started lasix 63m  Not feeling better Started both medications Leg swelling resolved Having swelling in hands and face Still feeling DOE, was given oxygen in the hospital, wonders if she needs to be back on it Continues to have orthopnea "Every now and again little pain in the chest, feels more like gas" Uses cane  Urine microalb/crt ratio in feb 2021: 2,764 BNP on March 13 2020: 346.7 BNP on February 27 2020: 127.6  Echo February 28 2020 Left ventricular ejection fraction, by estimation, is 55 to 60%. The left ventricle has normal function. The left ventricle has no regional wall motion abnormalities. There is mild concentric left ventricular hypertrophy. Left ventricular diastolic parameters are consistent with Grade I diastolic dysfunction (impaired relaxation). Elevated left atrial pressure. 2. Right ventricular systolic function is normal. The right ventricular size is normal. There is mildly elevated pulmonary artery systolic pressure. The estimated right ventricular systolic pressure is 382.7mmHg. 3. Left atrial size was mildly dilated. 4. Right atrial size was mildly dilated. 5. The mitral valve is normal in structure. No evidence of mitral valve regurgitation. 6. The aortic valve is normal in structure. Aortic valve regurgitation is mild to moderate. Mild aortic valve sclerosis is present, with no evidence of aortic valve  stenosis. 7. The inferior vena cava is normal in size with greater than 50% respiratory variability, suggesting right atrial pressure of 3 mmHg. Comparison(s): No significant change from prior study. Prior images reviewed side by side.   Wt Readings from Last 3 Encounters:  03/31/20 263 lb (119.3 kg)  03/20/20 263 lb (119.3 kg)  03/13/20 261 lb 11 oz (118.7 kg)   BP Readings from Last 3 Encounters:  03/31/20 (!) 184/78  03/20/20 (!) 170/78  03/13/20 (!) 177/92    Lab Results  Component Value Date   CREATININE 1.40 (H) 03/13/2020   BUN 11 03/13/2020   NA 142 03/13/2020   K 3.9 03/13/2020   CL 109 03/13/2020   CO2 25 03/13/2020    Depression screen PHQ 2/9 03/20/2020 01/06/2020 05/10/2019  Decreased Interest 0 0 0  Down, Depressed, Hopeless 0 0 0  PHQ - 2 Score 0 0 0    Fall Risk  03/31/2020 03/20/2020 01/06/2020 05/10/2019 05/10/2019  Falls in the past year? 0 0 0 0 0  Number falls in past yr: 0 0 0 0 0  Injury with Fall? 0 0 0 0 0  Follow up Falls evaluation completed Falls evaluation completed - - -     Allergies  Allergen Reactions  . Atorvastatin Swelling    Lip swelling  . Nifedipine Er Other (See Comments)    Caused nose bleeds in higher doses (90 mg., namely)  . Latex Rash    Prior to Admission medications   Medication Sig Start Date End Date Taking? Authorizing Provider  acetaminophen (TYLENOL) 500 MG tablet Take 1,000 mg by  mouth every 6 (six) hours as needed for moderate pain or headache.    Yes [provider]  aspirin 81 MG EC tablet Take 1 tablet (81 mg total) by mouth daily. 04/06/18  Yes Rutherford Guys, MD  blood glucose meter kit and supplies per insurance preference. Check cbg once a day. Dx E11.9 01/06/20  Yes Rutherford Guys, MD  fluticasone [facility]) 50 MCG/ACT nasal spray Place 1 spray into both nostrils 2 (two) times daily as needed for allergies or rhinitis. 03/20/20  Yes Rutherford Guys, MD  furosemide (LASIX) 20 MG tablet Take 1 tablet  (20 mg total) by mouth daily. 03/20/20  Yes Rutherford Guys, MD  glimepiride (AMARYL) 1 MG tablet Take 1 tablet (1 mg total) by mouth daily with breakfast. 01/18/20  Yes Rutherford Guys, MD  hydrALAZINE (APRESOLINE) 100 MG tablet Take 1 tablet (100 mg total) by mouth 3 (three) times daily. 03/02/20  Yes Lavina Hamman, MD  isosorbide mononitrate (IMDUR) 30 MG 24 hr tablet Take 1 tablet (30 mg total) by mouth daily. 03/03/20  Yes Lavina Hamman, MD  metoprolol succinate (TOPROL XL) 50 MG 24 hr tablet Take 1 tablet (50 mg total) by mouth daily. Take with or immediately following a meal. 03/20/20  Yes Rutherford Guys, MD  NIFEdipine (ADALAT CC) 60 MG 24 hr tablet Take 1 tablet (60 mg total) by mouth daily. 01/06/20  Yes Rutherford Guys, MD  polyethylene glycol (MIRALAX / GLYCOLAX) 17 g packet Take 17 g by mouth daily as needed for moderate constipation. 03/02/20  Yes Lavina Hamman, MD  rosuvastatin (CRESTOR) 10 MG tablet Take 1 tablet (10 mg total) by mouth daily. 03/20/20  Yes Rutherford Guys, MD  sitaGLIPtin (JANUVIA) 50 MG tablet Take 1 tablet (50 mg total) by mouth daily. 01/06/20  Yes Rutherford Guys, MD  nicotine (NICODERM CQ - DOSED IN MG/24 HOURS) 14 mg/24hr patch Place 1 patch (14 mg total) onto the skin daily. Patient not taking: Reported on 03/31/2020 03/03/20   Lavina Hamman, MD  pantoprazole (PROTONIX) 40 MG tablet Take 1 tablet (40 mg total) by mouth 2 (two) times daily for 10 days. Take 30-60 minutes before breakfast Patient not taking: Reported on 03/13/2020 09/06/19 09/16/19  Levin Erp, PA    Past Medical History:  Diagnosis Date  . Allergy   . Arthritis   . Chronic headaches   . Constipation   . Depression   . Diabetes mellitus   . Hyperlipidemia   . Hypertension   . Obesity   . PAD (peripheral artery disease) ([place])   . Shortness of breath   . Stroke [facility])     Past Surgical History:  Procedure Laterality Date  . ABDOMINAL AORTOGRAM W/LOWER EXTREMITY N/A  12/17/2018   Procedure: ABDOMINAL AORTOGRAM W/LOWER EXTREMITY;  Surgeon: Waynetta Sandy, MD;  Location: [place] [facility];  Service: Cardiovascular;  Laterality: N/A;  . FEMORAL BYPASS  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 Bridgett Larsson  . PERIPHERAL VASCULAR ATHERECTOMY Left 12/17/2018   Procedure: PERIPHERAL VASCULAR ATHERECTOMY;  Surgeon: Waynetta Sandy, MD;  Location: [place] [facility];  Service: Cardiovascular;  Laterality: Left;  . PERIPHERAL VASCULAR BALLOON ANGIOPLASTY Left 12/17/2018   Procedure: PERIPHERAL VASCULAR BALLOON ANGIOPLASTY;  Surgeon: Waynetta Sandy, MD;  Location: [place] [facility];  Service: Cardiovascular;  Laterality: Left;    Social History   Tobacco Use  .  Smoking status: Light Tobacco Smoker    Packs/day: 0.50    Years: 35.00    Pack years: 17.50    Types: Cigarettes  . Smokeless tobacco: Never Used  . Tobacco comment: patient is trying to quit and has gone down to half a pack per day  Substance Use Topics  . Alcohol use: Yes    Alcohol/week: 1.0 standard drinks    Types: 1 Standard drinks or equivalent per week    Comment: occasional use only    Family History  Problem Relation Age of Onset  . Diabetes Father   . Heart disease Mother        NOT before age 60-  Bypass  . Hypertension Mother   . Hypertension Sister   . Varicose Veins Brother   . Heart disease Brother        Before age 24  . Hypertension Daughter   . Colon cancer Neg Hx   . Esophageal cancer Neg Hx   . Rectal cancer Neg Hx   . Stomach cancer Neg Hx     ROS Per hpi  OBJECTIVE:  Today's Vitals   03/31/20 1137 03/31/20 1156  BP: (!) 184/78 (!) 162/73  Pulse: 79   Temp: 97.8 F (36.6 C)   SpO2: 92% 98%  Weight: 263 lb (119.3 kg)   Height: 5' 7.5" (1.715 m)    Body mass index is 40.58 kg/m.   Physical Exam Vitals and nursing note reviewed.  Constitutional:      Appearance: She is well-developed.  HENT:      Head: Normocephalic and atraumatic.     Mouth/Throat:     Pharynx: No oropharyngeal exudate.  Eyes:     General: No scleral icterus.    Conjunctiva/sclera: Conjunctivae normal.     Pupils: Pupils are equal, round, and reactive to light.  Cardiovascular:     Rate and Rhythm: Normal rate and regular rhythm.     Heart sounds: Normal heart sounds. No murmur. No friction rub. No gallop.   Pulmonary:     Effort: Pulmonary effort is normal.     Breath sounds: Normal breath sounds. No wheezing, rhonchi or rales.  Musculoskeletal:     Cervical back: Neck supple.     Right lower leg: No edema.     Left lower leg: No edema.  Skin:    General: Skin is warm and dry.  Neurological:     Mental Status: She is alert and oriented to person, place, and time.     Resting room air O2 sat is 98% Walking room air O2 sat is 88% Walking on 2L of oxygen sat is 92%  6 minute walk:  88% at 2 minutes with HR 92 without oxygen with DOE  6 minute walk with oxygen 2L via [place] Lowest O2 @ 92% with HR 96   No results found for this or any previous visit (from the past 24 hour(s)).  No results found.    ASSESSMENT and PLAN  1. Resistant hypertension Not controlled, referring to cards for further eval and treatment. Edema could be due to decompensated CHF vs proteinuria from CKD3/DM2. DOE now on oxygen - angina equivalent ? Vs CHF. Again referring to cards.  - Ambulatory referral to Cardiology  2. Stage 3a chronic kidney disease - Basic Metabolic Panel - Ambulatory referral to Nephrology  3. Chronic diastolic congestive heart failure (HCC) - Brain natriuretic peptide - Ambulatory referral to Cardiology  4. Oxygen desaturation Started on oxygen 2L via  [place] for ambulation  5. Abnormality of gait Uses a cane  Other orders - furosemide (LASIX) 40 MG tablet; Take 1 tablet (40 mg total) by mouth daily.  Return in about 1 week (around 04/07/2020).    Rutherford Guys, MD Primary Care at  [place], [place] [address] Ph.  [phone] Fax [phone]

## 2020-03-31 NOTE — Patient Instructions (Addendum)
  Rest RA 02 Sat=98% Walking on room air =88% Walking on  2 liter 02=92%   If you have lab work done today you will be contacted with your lab results within the next 2 weeks.  If you have not heard from [place] then please contact us. The fastest way to get your results is to register for My Chart.   IF you received an x-ray today, you will receive an invoice from [facility]. Please contact [facility]iology at [phone] with questions or concerns regarding your invoice.   IF you received labwork today, you will receive an invoice from [facility]. Please contact [facility] at [phone] with questions or concerns regarding your invoice.   Our billing staff will not be able to assist you with questions regarding bills from these companies.  You will be contacted with the lab results as soon as they are available. The fastest way to get your results is to activate your My Chart account. Instructions are located on the last page of this paperwork. If you have not heard from [place] regarding the results in 2 weeks, please contact this office.

## 2020-03-31 NOTE — Telephone Encounter (Signed)
Gilda with [employer] stated the order for O2 testing needs to be corrected. Cb# [phone]

## 2020-04-01 DIAGNOSIS — I509 Heart failure, unspecified: Secondary | ICD-10-CM | POA: Diagnosis not present

## 2020-04-01 DIAGNOSIS — M170 Bilateral primary osteoarthritis of knee: Secondary | ICD-10-CM | POA: Diagnosis not present

## 2020-04-01 DIAGNOSIS — E1122 Type 2 diabetes mellitus with diabetic chronic kidney disease: Secondary | ICD-10-CM | POA: Diagnosis not present

## 2020-04-01 DIAGNOSIS — M47894 Other spondylosis, thoracic region: Secondary | ICD-10-CM | POA: Diagnosis not present

## 2020-04-01 DIAGNOSIS — J441 Chronic obstructive pulmonary disease with (acute) exacerbation: Secondary | ICD-10-CM | POA: Diagnosis not present

## 2020-04-01 DIAGNOSIS — N183 Chronic kidney disease, stage 3 unspecified: Secondary | ICD-10-CM | POA: Diagnosis not present

## 2020-04-01 DIAGNOSIS — E1151 Type 2 diabetes mellitus with diabetic peripheral angiopathy without gangrene: Secondary | ICD-10-CM | POA: Diagnosis not present

## 2020-04-01 DIAGNOSIS — I130 Hypertensive heart and chronic kidney disease with heart failure and stage 1 through stage 4 chronic kidney disease, or unspecified chronic kidney disease: Secondary | ICD-10-CM | POA: Diagnosis not present

## 2020-04-01 DIAGNOSIS — F17210 Nicotine dependence, cigarettes, uncomplicated: Secondary | ICD-10-CM | POA: Diagnosis not present

## 2020-04-01 LAB — BRAIN NATRIURETIC PEPTIDE: BNP: 378.3 pg/mL — ABNORMAL HIGH (ref 0.0–100.0)

## 2020-04-01 NOTE — Addendum Note (Signed)
Addended by: Norton Blizzard R on: 6/85/9923 09:31 AM   Modules accepted: Orders

## 2020-04-02 DIAGNOSIS — I639 Cerebral infarction, unspecified: Secondary | ICD-10-CM | POA: Diagnosis not present

## 2020-04-02 DIAGNOSIS — I509 Heart failure, unspecified: Secondary | ICD-10-CM | POA: Diagnosis not present

## 2020-04-02 DIAGNOSIS — I10 Essential (primary) hypertension: Secondary | ICD-10-CM | POA: Diagnosis not present

## 2020-04-04 DIAGNOSIS — J441 Chronic obstructive pulmonary disease with (acute) exacerbation: Secondary | ICD-10-CM | POA: Diagnosis not present

## 2020-04-04 DIAGNOSIS — E1151 Type 2 diabetes mellitus with diabetic peripheral angiopathy without gangrene: Secondary | ICD-10-CM | POA: Diagnosis not present

## 2020-04-04 DIAGNOSIS — F17210 Nicotine dependence, cigarettes, uncomplicated: Secondary | ICD-10-CM | POA: Diagnosis not present

## 2020-04-04 DIAGNOSIS — M47894 Other spondylosis, thoracic region: Secondary | ICD-10-CM | POA: Diagnosis not present

## 2020-04-04 DIAGNOSIS — N183 Chronic kidney disease, stage 3 unspecified: Secondary | ICD-10-CM | POA: Diagnosis not present

## 2020-04-04 DIAGNOSIS — I130 Hypertensive heart and chronic kidney disease with heart failure and stage 1 through stage 4 chronic kidney disease, or unspecified chronic kidney disease: Secondary | ICD-10-CM | POA: Diagnosis not present

## 2020-04-04 DIAGNOSIS — E1122 Type 2 diabetes mellitus with diabetic chronic kidney disease: Secondary | ICD-10-CM | POA: Diagnosis not present

## 2020-04-04 DIAGNOSIS — M170 Bilateral primary osteoarthritis of knee: Secondary | ICD-10-CM | POA: Diagnosis not present

## 2020-04-04 DIAGNOSIS — I509 Heart failure, unspecified: Secondary | ICD-10-CM | POA: Diagnosis not present

## 2020-04-06 ENCOUNTER — Ambulatory Visit: Payer: Medicare HMO | Admitting: Family Medicine

## 2020-04-07 ENCOUNTER — Ambulatory Visit: Payer: Medicare HMO | Admitting: Registered Nurse

## 2020-04-07 ENCOUNTER — Telehealth: Payer: Self-pay

## 2020-04-07 NOTE — Telephone Encounter (Signed)
Form faxed back to [place] regarding pt o2

## 2020-04-09 DIAGNOSIS — M47894 Other spondylosis, thoracic region: Secondary | ICD-10-CM | POA: Diagnosis not present

## 2020-04-09 DIAGNOSIS — E1151 Type 2 diabetes mellitus with diabetic peripheral angiopathy without gangrene: Secondary | ICD-10-CM | POA: Diagnosis not present

## 2020-04-09 DIAGNOSIS — N183 Chronic kidney disease, stage 3 unspecified: Secondary | ICD-10-CM | POA: Diagnosis not present

## 2020-04-09 DIAGNOSIS — M170 Bilateral primary osteoarthritis of knee: Secondary | ICD-10-CM | POA: Diagnosis not present

## 2020-04-09 DIAGNOSIS — E1122 Type 2 diabetes mellitus with diabetic chronic kidney disease: Secondary | ICD-10-CM | POA: Diagnosis not present

## 2020-04-09 DIAGNOSIS — F17210 Nicotine dependence, cigarettes, uncomplicated: Secondary | ICD-10-CM | POA: Diagnosis not present

## 2020-04-09 DIAGNOSIS — I509 Heart failure, unspecified: Secondary | ICD-10-CM | POA: Diagnosis not present

## 2020-04-09 DIAGNOSIS — I130 Hypertensive heart and chronic kidney disease with heart failure and stage 1 through stage 4 chronic kidney disease, or unspecified chronic kidney disease: Secondary | ICD-10-CM | POA: Diagnosis not present

## 2020-04-09 DIAGNOSIS — J441 Chronic obstructive pulmonary disease with (acute) exacerbation: Secondary | ICD-10-CM | POA: Diagnosis not present

## 2020-04-12 NOTE — Progress Notes (Deleted)
Cardiology Office Note:    Date:  04/12/2020   ID:  Alicia [name], DOB [birth year], MRN [id number]  PCP:  Rutherford Guys, MD  Cardiologist:  No primary care provider on file.  Electrophysiologist:  None   Referring MD: Rutherford Guys, MD   No chief complaint on file. ***  History of Present Illness:    Alicia [name] is a 69 y.o. female with a hx of PAD status post left femoropopliteal bypass, CVA, chronic diastolic heart failure, tobacco use, type 2 diabetes, hyperlipidemia, hypertension who is referred by Dr. Pamella Pert for evaluation of resistant hypertension.  He was admitted from 02/27/2020 through 03/02/2020 with hypertensive urgency.  Presented with shortness of breath, found to be hypertensive and hypoxic.  Started on nicardipine drip and IV Lasix.  Discharged on nifedipine 60 mg daily, Toprol-XL 50 mg daily, Imdur 30 mg daily, Lasix 40 mg daily, and hydralazine 100 mg 3 times daily.  Baseline creatinine appears to be around 1.4, was 1.86 on discharge on 03/02/2020.  Creatinine improved to 1.5 on 03/31/2020.  TTE 02/28/2020 showed normal biventricular function, grade 1 diastolic dysfunction, mild to moderate AI, IVC small/collapsible.  Past Medical History:  Diagnosis Date  . Allergy   . Arthritis   . Chronic headaches   . Constipation   . Depression   . Diabetes mellitus   . Hyperlipidemia   . Hypertension   . Obesity   . PAD (peripheral artery disease) ([place])   . Shortness of breath   . Stroke [facility])     Past Surgical History:  Procedure Laterality Date  . ABDOMINAL AORTOGRAM W/LOWER EXTREMITY N/A 12/17/2018   Procedure: ABDOMINAL AORTOGRAM W/LOWER EXTREMITY;  Surgeon: Waynetta Sandy, MD;  Location: [place] [facility];  Service: Cardiovascular;  Laterality: N/A;  . FEMORAL BYPASS  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 Bridgett Larsson  . PERIPHERAL VASCULAR ATHERECTOMY Left 12/17/2018   Procedure: PERIPHERAL VASCULAR ATHERECTOMY;   Surgeon: Waynetta Sandy, MD;  Location: [place] [facility];  Service: Cardiovascular;  Laterality: Left;  . PERIPHERAL VASCULAR BALLOON ANGIOPLASTY Left 12/17/2018   Procedure: PERIPHERAL VASCULAR BALLOON ANGIOPLASTY;  Surgeon: Waynetta Sandy, MD;  Location: [place] [facility];  Service: Cardiovascular;  Laterality: Left;    Current Medications: No outpatient medications have been marked as taking for the 04/13/20 encounter (Appointment) with Donato Heinz, MD.     Allergies:   Atorvastatin, Nifedipine er, and Latex   Social History   Socioeconomic History  . Marital status: Single    Spouse name: Not on file  . Number of children: Not on file  . Years of education: Not on file  . Highest education level: Not on file  Occupational History  . Not on file  Tobacco Use  . Smoking status: Light Tobacco Smoker    Packs/day: 0.50    Years: 35.00    Pack years: 17.50    Types: Cigarettes  . Smokeless tobacco: Never Used  . Tobacco comment: patient is trying to quit and has gone down to half a pack per day  Substance and Sexual Activity  . Alcohol use: Yes    Alcohol/week: 1.0 standard drinks    Types: 1 Standard drinks or equivalent per week    Comment: occasional use only  . Drug use: No  . Sexual activity: Not Currently  Other Topics Concern  . Not on file  Social History Narrative  . Not on file  Social Determinants of Health   Financial Resource Strain:   . Difficulty of Paying Living Expenses:   Food Insecurity:   . Worried About Charity fundraiser in the Last Year:   . Arboriculturist in the Last Year:   Transportation Needs:   . Film/video editor (Medical):   Marland Kitchen Lack of Transportation (Non-Medical):   Physical Activity:   . Days of Exercise per Week:   . Minutes of Exercise per Session:   Stress:   . Feeling of Stress :   Social Connections:   . Frequency of Communication with Friends and Family:   . Frequency of Social  Gatherings with Friends and Family:   . Attends Religious Services:   . Active Member of Clubs or Organizations:   . Attends Archivist Meetings:   Marland Kitchen Marital Status:      Family History: The patient's ***family history includes Diabetes in her father; Heart disease in her brother and mother; Hypertension in her daughter, mother, and sister; Varicose Veins in her brother. There is no history of Colon cancer, Esophageal cancer, Rectal cancer, or Stomach cancer.  ROS:   Please see the history of present illness.    *** All other systems reviewed and are negative.  EKGs/Labs/Other Studies Reviewed:    The following studies were reviewed today: ***  EKG:  EKG is *** ordered today.  The ekg ordered today demonstrates ***  Recent Labs: 02/27/2020: TSH 0.471 02/28/2020: Magnesium 2.3 03/13/2020: ALT 22; Hemoglobin 11.6; Platelets 349 03/31/2020: BNP 378.3; BUN 18; Creatinine, Ser 1.54; Potassium 3.7; Sodium 145  Recent Lipid Panel    Component Value Date/Time   CHOL 209 (H) 02/29/2020 0331   CHOL 266 (H) 01/06/2020 0948   TRIG 109 02/29/2020 0331   HDL 54 02/29/2020 0331   HDL 52 01/06/2020 0948   CHOLHDL 3.9 02/29/2020 0331   VLDL 22 02/29/2020 0331   LDLCALC 133 (H) 02/29/2020 0331   LDLCALC 185 (H) 01/06/2020 0948    Physical Exam:    VS:  There were no vitals taken for this visit.    Wt Readings from Last 3 Encounters:  03/31/20 263 lb (119.3 kg)  03/20/20 263 lb (119.3 kg)  03/13/20 261 lb 11 oz (118.7 kg)     GEN: *** Well nourished, well developed in no acute distress HEENT: Normal NECK: No JVD; No carotid bruits LYMPHATICS: No lymphadenopathy CARDIAC: ***RRR, no murmurs, rubs, gallops RESPIRATORY:  Clear to auscultation without rales, wheezing or rhonchi  ABDOMEN: Soft, non-tender, non-distended MUSCULOSKELETAL:  No edema; No deformity  SKIN: Warm and dry NEUROLOGIC:  Alert and oriented x 3 PSYCHIATRIC:  Normal affect   ASSESSMENT:    No diagnosis  found. PLAN:    Hypertension: on nifedipine 60 mg daily, Toprol-XL 50 mg daily, Imdur 30 mg daily, Lasix 40 mg daily, and hydralazine 100 mg 3 times daily  Chronic diastolic heart failure: On Lasix 40 mg  Hyperlipidemia: On rosuvastatin 10 mg daily  PAD: status post left femoropopliteal bypass.  History recent ABIs 02/01/2019 showed right 0.63, left 1.01  RTC in ***  Medication Adjustments/Labs and Tests Ordered: Current medicines are reviewed at length with the patient today.  Concerns regarding medicines are outlined above.  No orders of the defined types were placed in this encounter.  No orders of the defined types were placed in this encounter.   There are no Patient Instructions on file for this visit.   Signed, Donato Heinz, MD  04/12/2020 10:18 PM    [facility]

## 2020-04-13 ENCOUNTER — Telehealth: Payer: Self-pay | Admitting: Licensed Clinical Social Worker

## 2020-04-13 ENCOUNTER — Ambulatory Visit: Payer: Medicare HMO | Admitting: Cardiology

## 2020-04-13 ENCOUNTER — Telehealth: Payer: Self-pay

## 2020-04-13 NOTE — Telephone Encounter (Signed)
CSW received referral to assist patient with transportation to medical appointments. CSW attempted to contact patient with no ability to leave message. CSW will attempt again. Raquel Sarna, [place], [place]

## 2020-04-13 NOTE — Telephone Encounter (Signed)
Spoke with patient about establishing transportation.

## 2020-04-21 ENCOUNTER — Encounter: Payer: Self-pay | Admitting: General Practice

## 2020-04-22 ENCOUNTER — Telehealth: Payer: Self-pay | Admitting: Licensed Clinical Social Worker

## 2020-04-22 NOTE — Telephone Encounter (Signed)
CSW attempted to reach patient again to offer assistance with transportation to appointments. CSW unable to leave message as no voicemail available. Raquel Sarna, [place], [place]

## 2020-05-03 DIAGNOSIS — I509 Heart failure, unspecified: Secondary | ICD-10-CM | POA: Diagnosis not present

## 2020-05-03 DIAGNOSIS — I639 Cerebral infarction, unspecified: Secondary | ICD-10-CM | POA: Diagnosis not present

## 2020-05-03 DIAGNOSIS — I10 Essential (primary) hypertension: Secondary | ICD-10-CM | POA: Diagnosis not present

## 2020-05-13 ENCOUNTER — Telehealth: Payer: Self-pay | Admitting: *Deleted

## 2020-05-13 NOTE — Telephone Encounter (Signed)
Schedule AWV.  

## 2020-06-02 DIAGNOSIS — I509 Heart failure, unspecified: Secondary | ICD-10-CM | POA: Diagnosis not present

## 2020-06-02 DIAGNOSIS — I639 Cerebral infarction, unspecified: Secondary | ICD-10-CM | POA: Diagnosis not present

## 2020-06-02 DIAGNOSIS — I10 Essential (primary) hypertension: Secondary | ICD-10-CM | POA: Diagnosis not present

## 2020-06-15 ENCOUNTER — Encounter: Payer: Self-pay | Admitting: Family Medicine

## 2020-06-15 ENCOUNTER — Other Ambulatory Visit: Payer: Self-pay

## 2020-06-15 ENCOUNTER — Ambulatory Visit (INDEPENDENT_AMBULATORY_CARE_PROVIDER_SITE_OTHER): Payer: Medicare HMO | Admitting: Family Medicine

## 2020-06-15 VITALS — BP 173/95 | HR 94 | Temp 97.9°F | Ht 67.5 in | Wt 245.0 lb

## 2020-06-15 DIAGNOSIS — E1169 Type 2 diabetes mellitus with other specified complication: Secondary | ICD-10-CM

## 2020-06-15 DIAGNOSIS — N1831 Chronic kidney disease, stage 3a: Secondary | ICD-10-CM | POA: Diagnosis not present

## 2020-06-15 DIAGNOSIS — E669 Obesity, unspecified: Secondary | ICD-10-CM | POA: Diagnosis not present

## 2020-06-15 DIAGNOSIS — I10 Essential (primary) hypertension: Secondary | ICD-10-CM

## 2020-06-15 DIAGNOSIS — E782 Mixed hyperlipidemia: Secondary | ICD-10-CM | POA: Diagnosis not present

## 2020-06-15 MED ORDER — POLYETHYLENE GLYCOL 3350 17 GM/SCOOP PO POWD: 17.0000 g | Freq: Two times a day (BID) | ORAL | 5 refills | Status: DC | PRN

## 2020-06-15 MED ORDER — FUROSEMIDE 40 MG PO TABS: 40.0000 mg | ORAL_TABLET | Freq: Every day | ORAL | 3 refills | Status: DC

## 2020-06-15 MED ORDER — METOPROLOL SUCCINATE ER 50 MG PO TB24: 50.0000 mg | ORAL_TABLET | Freq: Every day | ORAL | 2 refills | Status: DC

## 2020-06-15 MED ORDER — HYDRALAZINE HCL 100 MG PO TABS: 100.0000 mg | ORAL_TABLET | Freq: Three times a day (TID) | ORAL | 1 refills | Status: DC

## 2020-06-15 MED ORDER — SITAGLIPTIN PHOSPHATE 50 MG PO TABS: 50.0000 mg | ORAL_TABLET | Freq: Every day | ORAL | 1 refills | Status: DC

## 2020-06-15 MED ORDER — GLIMEPIRIDE 1 MG PO TABS: 1.0000 mg | ORAL_TABLET | Freq: Every day | ORAL | 1 refills | Status: DC

## 2020-06-15 MED ORDER — ROSUVASTATIN CALCIUM 10 MG PO TABS: 10.0000 mg | ORAL_TABLET | Freq: Every day | ORAL | 3 refills | Status: DC

## 2020-06-15 MED ORDER — NIFEDIPINE ER 60 MG PO TB24: 60.0000 mg | ORAL_TABLET | Freq: Every day | ORAL | 1 refills | Status: DC

## 2020-06-15 NOTE — Progress Notes (Signed)
7/26/202111:45 AM  Alicia [name] 1951/04/23, 69 y.o., female [id number]  Chief Complaint  Patient presents with  . Hypertension    158/80 at home/ could not make cards appt - not re-sched  . Shortness of Breath    follow up / uses home O2 PRN   . Diabetes    177 pp/ medication questions     HPI:   Patient is a 69 y.o. female with past medical history significant for HTN, DM2, HLP, PAD w hx L fem-pop bypass, CVA, dCHF, smoker, diverticulitiswho presents today forhosp followup  Last OV may 2021 - referred to cards (has not seen them), started O2 with ambulation, increased lasix to 27m daily  Feels that her breathing is better on higher dose of lasix Reports edema resolved Uses oxygen prn when walking Having transportation issues for medical appts Has ran out of hydralazine, imdur, metoprolol and glimperide She has no acute concerns today  Lab Results  Component Value Date   HGBA1C 7.3 (A) 01/06/2020   HGBA1C 7.5 (A) 02/07/2019   HGBA1C 7.1 (H) 10/09/2018   Lab Results  Component Value Date   LDLCALC 133 (H) 02/29/2020   CREATININE 1.54 (H) 03/31/2020    Depression screen PHQ 2/9 06/15/2020 03/20/2020 01/06/2020  Decreased Interest 0 0 0  Down, Depressed, Hopeless 0 0 0  PHQ - 2 Score 0 0 0    Fall Risk  06/15/2020 03/31/2020 03/20/2020 01/06/2020 05/10/2019  Falls in the past year? 0 0 0 0 0  Number falls in past yr: 0 0 0 0 0  Injury with Fall? 0 0 0 0 0  Follow up Falls evaluation completed Falls evaluation completed Falls evaluation completed - -     Allergies  Allergen Reactions  . Atorvastatin Swelling    Lip swelling  . Nifedipine Er Other (See Comments)    Caused nose bleeds in higher doses (90 mg., namely)  . Latex Rash    Prior to Admission medications   Medication Sig Start Date End Date Taking? Authorizing Provider  aspirin 81 MG EC tablet Take 1 tablet (81 mg total) by mouth daily. 04/06/18  Yes SRutherford Guys MD  blood glucose meter  kit and supplies per insurance preference. Check cbg once a day. Dx E11.9 01/06/20  Yes SRutherford Guys MD  fluticasone ([facility] 50 MCG/ACT nasal spray Place 1 spray into both nostrils 2 (two) times daily as needed for allergies or rhinitis. 03/20/20  Yes SRutherford Guys MD  furosemide (LASIX) 40 MG tablet Take 1 tablet (40 mg total) by mouth daily. 03/31/20  Yes SRutherford Guys MD  metoprolol succinate (TOPROL XL) 50 MG 24 hr tablet Take 1 tablet (50 mg total) by mouth daily. Take with or immediately following a meal. 03/20/20  Yes SRutherford Guys MD  NIFEdipine (ADALAT CC) 60 MG 24 hr tablet Take 1 tablet (60 mg total) by mouth daily. 01/06/20  Yes SRutherford Guys MD  OXYGEN Inhale into the lungs.   Yes [provider]  polyethylene glycol (MIRALAX / GLYCOLAX) 17 g packet Take 17 g by mouth daily as needed for moderate constipation. 03/02/20  Yes PLavina Hamman MD  rosuvastatin (CRESTOR) 10 MG tablet Take 1 tablet (10 mg total) by mouth daily. 03/20/20  Yes SRutherford Guys MD  sitaGLIPtin (JANUVIA) 50 MG tablet Take 1 tablet (50 mg total) by mouth daily. 01/06/20  Yes SRutherford Guys MD  acetaminophen (TYLENOL) 500 MG tablet Take 1,000 mg  by mouth every 6 (six) hours as needed for moderate pain or headache.     [provider]  glimepiride (AMARYL) 1 MG tablet Take 1 tablet (1 mg total) by mouth daily with breakfast. Patient not taking: Reported on 06/15/2020 01/18/20   Rutherford Guys, MD  hydrALAZINE (APRESOLINE) 100 MG tablet Take 1 tablet (100 mg total) by mouth 3 (three) times daily. Patient not taking: Reported on 06/15/2020 03/02/20   Lavina Hamman, MD  isosorbide mononitrate (IMDUR) 30 MG 24 hr tablet Take 1 tablet (30 mg total) by mouth daily. Patient not taking: Reported on 06/15/2020 03/03/20   Lavina Hamman, MD  nicotine (NICODERM CQ - DOSED IN MG/24 HOURS) 14 mg/24hr patch Place 1 patch (14 mg total) onto the skin daily. Patient not taking: Reported on  06/15/2020 03/03/20   Lavina Hamman, MD  pantoprazole (PROTONIX) 40 MG tablet Take 1 tablet (40 mg total) by mouth 2 (two) times daily for 10 days. Take 30-60 minutes before breakfast Patient not taking: Reported on 03/13/2020 09/06/19 09/16/19  Levin Erp, PA    Past Medical History:  Diagnosis Date  . Allergy   . Arthritis   . Chronic headaches   . Constipation   . Depression   . Diabetes mellitus   . Hyperlipidemia   . Hypertension   . Obesity   . PAD (peripheral artery disease) ([place])   . Shortness of breath   . Stroke [facility])     Past Surgical History:  Procedure Laterality Date  . ABDOMINAL AORTOGRAM W/LOWER EXTREMITY N/A 12/17/2018   Procedure: ABDOMINAL AORTOGRAM W/LOWER EXTREMITY;  Surgeon: Waynetta Sandy, MD;  Location: [place] [facility];  Service: Cardiovascular;  Laterality: N/A;  . FEMORAL BYPASS  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 Bridgett Larsson  . PERIPHERAL VASCULAR ATHERECTOMY Left 12/17/2018   Procedure: PERIPHERAL VASCULAR ATHERECTOMY;  Surgeon: Waynetta Sandy, MD;  Location: [place] [facility];  Service: Cardiovascular;  Laterality: Left;  . PERIPHERAL VASCULAR BALLOON ANGIOPLASTY Left 12/17/2018   Procedure: PERIPHERAL VASCULAR BALLOON ANGIOPLASTY;  Surgeon: Waynetta Sandy, MD;  Location: [place] [facility];  Service: Cardiovascular;  Laterality: Left;    Social History   Tobacco Use  . Smoking status: Light Tobacco Smoker    Packs/day: 0.50    Years: 35.00    Pack years: 17.50    Types: Cigarettes  . Smokeless tobacco: Never Used  . Tobacco comment: patient is trying to quit and has gone down to half a pack per day  Substance Use Topics  . Alcohol use: Yes    Alcohol/week: 1.0 standard drink    Types: 1 Standard drinks or equivalent per week    Comment: occasional use only    Family History  Problem Relation Age of Onset  . Diabetes Father   . Heart disease Mother        NOT  before age 76-  Bypass  . Hypertension Mother   . Hypertension Sister   . Varicose Veins Brother   . Heart disease Brother        Before age 69  . Hypertension Daughter   . Colon cancer Neg Hx   . Esophageal cancer Neg Hx   . Rectal cancer Neg Hx   . Stomach cancer Neg Hx     Review of Systems  Constitutional: Negative for chills and fever.  Respiratory: Positive for shortness of breath. Negative for cough and wheezing.  Cardiovascular: Negative for chest pain, palpitations and leg swelling.  Gastrointestinal: Positive for constipation. Negative for abdominal pain, nausea and vomiting.     OBJECTIVE:  Today's Vitals   06/15/20 1124 06/15/20 1143  BP: (!) 186/84 (!) 173/95  Pulse: 94   Temp: 97.9 F (36.6 C)   SpO2: 93%   Weight: (!) 245 lb (111.1 kg)   Height: 5' 7.5" (1.715 m)    Body mass index is 37.81 kg/m.   Physical Exam Vitals and nursing note reviewed.  Constitutional:      Appearance: She is well-developed.  HENT:     Head: Normocephalic and atraumatic.     Mouth/Throat:     Pharynx: No oropharyngeal exudate.  Eyes:     General: No scleral icterus.    Extraocular Movements: Extraocular movements intact.     Conjunctiva/sclera: Conjunctivae normal.     Pupils: Pupils are equal, round, and reactive to light.  Cardiovascular:     Rate and Rhythm: Normal rate and regular rhythm.     Heart sounds: Normal heart sounds. No murmur heard.  No friction rub. No gallop.   Pulmonary:     Effort: Pulmonary effort is normal.     Breath sounds: Normal breath sounds. No decreased breath sounds, wheezing, rhonchi or rales.  Musculoskeletal:     Cervical back: Neck supple.     Right lower leg: No edema.     Left lower leg: No edema.  Skin:    General: Skin is warm and dry.  Neurological:     Mental Status: She is alert and oriented to person, place, and time.        No results found for this or any previous visit (from the past 24 hour(s)).  No results  found.   ASSESSMENT and PLAN  1. Diabetes mellitus type 2 in obese [facility]) Labs pending. Restart glimperide. Continue with januvia (renally dosed) - Hemoglobin A1c  2. Hypertension, essential Not at goal in setting of being off meds, refill metoprolol and hydralazine, cont nifedepine.  - metoprolol succinate (TOPROL XL) 50 MG 24 hr tablet; Take 1 tablet (50 mg total) by mouth daily. Take with or immediately following a meal.  3. Stage 3a chronic kidney disease Labs pending. If stable, consider restarting enalapril given proteinuria  4. Mixed hyperlipidemia Checking labs today, medications will be adjusted as needed. LDL goal < 70 - Comprehensive metabolic panel - Lipid panel  Other orders - OXYGEN; Inhale into the lungs. - polyethylene glycol powder (GLYCOLAX/MIRALAX) 17 GM/SCOOP powder; Take 17 g by mouth 2 (two) times daily as needed. - furosemide (LASIX) 40 MG tablet; Take 1 tablet (40 mg total) by mouth daily. - glimepiride (AMARYL) 1 MG tablet; Take 1 tablet (1 mg total) by mouth daily with breakfast. - hydrALAZINE (APRESOLINE) 100 MG tablet; Take 1 tablet (100 mg total) by mouth 3 (three) times daily. - NIFEdipine (ADALAT CC) 60 MG 24 hr tablet; Take 1 tablet (60 mg total) by mouth daily. - sitaGLIPtin (JANUVIA) 50 MG tablet; Take 1 tablet (50 mg total) by mouth daily. - rosuvastatin (CRESTOR) 10 MG tablet; Take 1 tablet (10 mg total) by mouth daily.  Return in about 3 months (around 09/15/2020).    Rutherford Guys, MD Primary Care at [facility] [place], [place] [address] Ph.  [phone] Fax [phone]

## 2020-06-15 NOTE — Patient Instructions (Addendum)
  For diabetes: restart glimperide, cont januvia  For blood pressure: restart metorpolol, restart hydralazine (1/2 tablet three times a day for 4 days, then increase to 1 tablet three times a day), cont nifedepine and furosemide  Come in 2 weeks (any day other Wed) for nurse BP check. Please let them to come get me before they let you go so that I can make changes accordingly if needed   If you have lab work done today you will be contacted with your lab results within the next 2 weeks.  If you have not heard from [place] then please contact us. The fastest way to get your results is to register for My Chart.   IF you received an x-ray today, you will receive an invoice from [facility]. Please contact [facility] at [phone] with questions or concerns regarding your invoice.   IF you received labwork today, you will receive an invoice from [facility]. Please contact [facility] at [phone] with questions or concerns regarding your invoice.   Our billing staff will not be able to assist you with questions regarding bills from these companies.  You will be contacted with the lab results as soon as they are available. The fastest way to get your results is to activate your My Chart account. Instructions are located on the last page of this paperwork. If you have not heard from [place] regarding the results in 2 weeks, please contact this office.

## 2020-06-16 LAB — LIPID PANEL
Chol/HDL Ratio: 3.6 ratio (ref 0.0–4.4)
Cholesterol, Total: 187 mg/dL (ref 100–199)
HDL: 52 mg/dL (ref >39)
LDL Chol Calc (NIH): 114 mg/dL — ABNORMAL HIGH (ref 0–99)
Triglycerides: 115 mg/dL (ref 0–149)
VLDL Cholesterol Cal: 21 mg/dL (ref 5–40)

## 2020-06-16 LAB — COMPREHENSIVE METABOLIC PANEL
ALT: 9 IU/L (ref 0–32)
AST: 13 IU/L (ref 0–40)
Albumin/Globulin Ratio: 1 — ABNORMAL LOW (ref 1.2–2.2)
Albumin: 3.7 g/dL — ABNORMAL LOW (ref 3.8–4.8)
Alkaline Phosphatase: 130 IU/L — ABNORMAL HIGH (ref 48–121)
BUN/Creatinine Ratio: 14 (ref 12–28)
BUN: 24 mg/dL (ref 8–27)
Bilirubin Total: 0.4 mg/dL (ref 0.0–1.2)
CO2: 21 mmol/L (ref 20–29)
Calcium: 8.7 mg/dL (ref 8.7–10.3)
Chloride: 101 mmol/L (ref 96–106)
Creatinine, Ser: 1.71 mg/dL — ABNORMAL HIGH (ref 0.57–1.00)
GFR calc Af Amer: 35 mL/min/{1.73_m2} — ABNORMAL LOW (ref >59)
GFR calc non Af Amer: 30 mL/min/{1.73_m2} — ABNORMAL LOW (ref >59)
Globulin, Total: 3.7 g/dL (ref 1.5–4.5)
Glucose: 185 mg/dL — ABNORMAL HIGH (ref 65–99)
Potassium: 4.4 mmol/L (ref 3.5–5.2)
Sodium: 138 mmol/L (ref 134–144)
Total Protein: 7.4 g/dL (ref 6.0–8.5)

## 2020-06-16 LAB — HEMOGLOBIN A1C
Est. average glucose Bld gHb Est-mCnc: 154 mg/dL
Hgb A1c MFr Bld: 7 % — ABNORMAL HIGH (ref 4.8–5.6)

## 2020-06-29 ENCOUNTER — Ambulatory Visit (INDEPENDENT_AMBULATORY_CARE_PROVIDER_SITE_OTHER): Payer: Medicare HMO | Admitting: Emergency Medicine

## 2020-06-29 ENCOUNTER — Telehealth: Payer: Self-pay

## 2020-06-29 ENCOUNTER — Other Ambulatory Visit: Payer: Self-pay

## 2020-06-29 VITALS — BP 170/82 | HR 80 | Temp 97.8°F

## 2020-06-29 DIAGNOSIS — Z0130 Encounter for examination of blood pressure without abnormal findings: Secondary | ICD-10-CM

## 2020-06-29 NOTE — Patient Instructions (Addendum)
Pt has come to the office for a Nurse visit BP check and she stated that she has not taken her medication this morning.  The blood pressure reading at her office visit on 06/15/20 were 186/84 &173/95 Today for NURSE VISIT her first reading is 198/81 & manuel reading 5 min later was 170/82 and pt stated that was better. She went on to say that she has not been taking the Hydralazine at all for the last 3-4 days because it doesn't make her feel better.   I have informed her to bring in her medication with her next time she comes in.

## 2020-06-29 NOTE — Telephone Encounter (Signed)
Pt has come to the office for a Nurse visit BP check and she stated that she has not taken her medication this morning.  The blood pressure reading at her office visit on 06/15/20 were 186/84 &173/95 Today for NURSE VISIT her first reading is 198/81 & manuel reading 5 min later was 170/82 and pt stated that was better. She went on to say that she has not been taking the Hydralazine at all for the last 3-4 days because it doesn't make her feel better.   I have informed her to bring in her medication with her next time she comes in.   Please advise on what you wanted the pt to do.

## 2020-06-30 ENCOUNTER — Other Ambulatory Visit: Payer: Self-pay | Admitting: Family Medicine

## 2020-06-30 MED ORDER — HYDRALAZINE HCL 100 MG PO TABS: 50.0000 mg | ORAL_TABLET | Freq: Two times a day (BID) | ORAL | 1 refills | Status: DC

## 2020-06-30 NOTE — Telephone Encounter (Signed)
Please advise patient that her BP was high, she should be taking hydralazine, she could start at 1/2 tab hydralazine twice a day if needed. thanks

## 2020-06-30 NOTE — Telephone Encounter (Signed)
Spoke with pt, she understands how she should be taking her bp med. She will come in, in 1 wk for bp chk nurse visit

## 2020-07-03 DIAGNOSIS — I10 Essential (primary) hypertension: Secondary | ICD-10-CM | POA: Diagnosis not present

## 2020-07-03 DIAGNOSIS — I639 Cerebral infarction, unspecified: Secondary | ICD-10-CM | POA: Diagnosis not present

## 2020-07-03 DIAGNOSIS — I509 Heart failure, unspecified: Secondary | ICD-10-CM | POA: Diagnosis not present

## 2020-07-14 ENCOUNTER — Encounter: Payer: Self-pay | Admitting: Family Medicine

## 2020-07-14 ENCOUNTER — Telehealth (INDEPENDENT_AMBULATORY_CARE_PROVIDER_SITE_OTHER): Payer: Medicare HMO | Admitting: Family Medicine

## 2020-07-14 ENCOUNTER — Ambulatory Visit (INDEPENDENT_AMBULATORY_CARE_PROVIDER_SITE_OTHER): Payer: Medicare HMO | Admitting: Family Medicine

## 2020-07-14 ENCOUNTER — Other Ambulatory Visit: Payer: Self-pay

## 2020-07-14 VITALS — BP 146/79 | HR 82 | Wt 249.0 lb

## 2020-07-14 DIAGNOSIS — N289 Disorder of kidney and ureter, unspecified: Secondary | ICD-10-CM

## 2020-07-14 DIAGNOSIS — I160 Hypertensive urgency: Secondary | ICD-10-CM

## 2020-07-14 DIAGNOSIS — Z0130 Encounter for examination of blood pressure without abnormal findings: Secondary | ICD-10-CM

## 2020-07-14 NOTE — Progress Notes (Signed)
Patient came in today for BP check nurse visit initial reading 176/75 after sitting for 5 mins 146/79. Pt stated she doesn't feel glimepiride is agreeing with her. Virtual appt scheduled to discuss with doctor.

## 2020-07-14 NOTE — Progress Notes (Signed)
Virtual Visit Note  I connected with patient on 07/14/20 at 538pm by phone (patient only has landline) and verified that I am speaking with the correct person using two identifiers. Alicia [name] is currently located at home and patient is currently with them during visit. The provider, Rutherford Guys, MD is located in their office at time of visit.  I discussed the limitations, risks, security and privacy concerns of performing an evaluation and management service by telephone and the availability of in person appointments. I also discussed with the patient that there may be a patient responsible charge related to this service. The patient expressed understanding and agreed to proceed.   I provided 19 minutes of non-face-to-face time during this encounter.  Chief Complaint  Patient presents with  . Hypertension    blood pressure is too high, not sure if it s the medication she is on. SOB with walking, pt understand she needs to stop smoking . Feels she can't quit on her own    HPI ? She reports her BP 170-180s Lowest read 159 which was yesterday She however does not tolerate taking her BP meds and stops taking them, feels weak, fatigued, drained She denies any CP, palpitations, worsening SOB, edema She is taking: nifedepine 52m takes AM - cant take 932mcauses nose bleed Furosemide 4031makes AM Metoprolol 17m27mkes AM Hydralazine 17mg94me a day, tend forget the 2nd dose She is not taking imdur  Lab Results  Component Value Date   CREATININE 1.71 (H) 06/15/2020   BUN 24 06/15/2020   NA 138 06/15/2020   K 4.4 06/15/2020   CL 101 06/15/2020   CO2 21 06/15/2020   Lab Results  Component Value Date   CREATININE 1.71 (H) 06/15/2020   CREATININE 1.54 (H) 03/31/2020   CREATININE 1.40 (H) 03/13/2020    Spironolactone 25mg 16ms AM - this however was stopped due to worsening crt   Allergies  Allergen Reactions  . Atorvastatin Swelling    Lip swelling  .  Nifedipine Er Other (See Comments)    Caused nose bleeds in higher doses (90 mg., namely)  . Latex Rash    Prior to Admission medications   Medication Sig Start Date End Date Taking? Authorizing Provider  acetaminophen (TYLENOL) 500 MG tablet Take 1,000 mg by mouth every 6 (six) hours as needed for moderate pain or headache.    Yes [provider]  aspirin 81 MG EC tablet Take 1 tablet (81 mg total) by mouth daily. 04/06/18  Yes SantiaRutherford Guysblood glucose meter kit and supplies per insurance preference. Check cbg once a day. Dx E11.9 01/06/20  Yes SantiaRutherford Guysfluticasone ([facility]/ACT nasal spray Place 1 spray into both nostrils 2 (two) times daily as needed for allergies or rhinitis. 03/20/20  Yes SantiaRutherford Guysfurosemide (LASIX) 40 MG tablet Take 1 tablet (40 mg total) by mouth daily. 06/15/20  Yes SantiaRutherford GuyshydrALAZINE (APRESOLINE) 100 MG tablet Take 0.5 tablets (50 mg total) by mouth 2 (two) times daily. 06/30/20  Yes SantiaRutherford Guysmetoprolol succinate (TOPROL XL) 50 MG 24 hr tablet Take 1 tablet (50 mg total) by mouth daily. Take with or immediately following a meal. 06/15/20  Yes SantiaRutherford Guysnicotine (NICODERM CQ - DOSED IN MG/24 HOURS) 14 mg/24hr patch Place 1 patch (14 mg total) onto the skin daily. 03/03/20  Yes Patel,Berle Mull  M, MD  NIFEdipine (ADALAT CC) 60 MG 24 hr tablet Take 1 tablet (60 mg total) by mouth daily. 06/15/20  Yes Rutherford Guys, MD  OXYGEN Inhale into the lungs.   Yes [provider]  polyethylene glycol powder (GLYCOLAX/MIRALAX) 17 GM/SCOOP powder Take 17 g by mouth 2 (two) times daily as needed. 06/15/20  Yes Rutherford Guys, MD  rosuvastatin (CRESTOR) 10 MG tablet Take 1 tablet (10 mg total) by mouth daily. 06/15/20  Yes Rutherford Guys, MD  sitaGLIPtin (JANUVIA) 50 MG tablet Take 1 tablet (50 mg total) by mouth daily. 06/15/20  Yes Rutherford Guys, MD  glimepiride (AMARYL) 1 MG tablet Take 1  tablet (1 mg total) by mouth daily with breakfast. 06/15/20   Rutherford Guys, MD  isosorbide mononitrate (IMDUR) 30 MG 24 hr tablet Take 1 tablet (30 mg total) by mouth daily. 03/03/20   Lavina Hamman, MD    Past Medical History:  Diagnosis Date  . Allergy   . Arthritis   . Chronic headaches   . Constipation   . Depression   . Diabetes mellitus   . Hyperlipidemia   . Hypertension   . Obesity   . PAD (peripheral artery disease) ([place])   . Shortness of breath   . Stroke [facility])     Past Surgical History:  Procedure Laterality Date  . ABDOMINAL AORTOGRAM W/LOWER EXTREMITY N/A 12/17/2018   Procedure: ABDOMINAL AORTOGRAM W/LOWER EXTREMITY;  Surgeon: Waynetta Sandy, MD;  Location: [place] [facility];  Service: Cardiovascular;  Laterality: N/A;  . FEMORAL BYPASS  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 Bridgett Larsson  . PERIPHERAL VASCULAR ATHERECTOMY Left 12/17/2018   Procedure: PERIPHERAL VASCULAR ATHERECTOMY;  Surgeon: Waynetta Sandy, MD;  Location: [place] [facility];  Service: Cardiovascular;  Laterality: Left;  . PERIPHERAL VASCULAR BALLOON ANGIOPLASTY Left 12/17/2018   Procedure: PERIPHERAL VASCULAR BALLOON ANGIOPLASTY;  Surgeon: Waynetta Sandy, MD;  Location: [place] [facility];  Service: Cardiovascular;  Laterality: Left;    Social History   Tobacco Use  . Smoking status: Light Tobacco Smoker    Packs/day: 0.50    Years: 35.00    Pack years: 17.50    Types: Cigarettes  . Smokeless tobacco: Never Used  . Tobacco comment: patient is trying to quit and has gone down to half a pack per day  Substance Use Topics  . Alcohol use: Yes    Alcohol/week: 1.0 standard drink    Types: 1 Standard drinks or equivalent per week    Comment: occasional use only    Family History  Problem Relation Age of Onset  . Diabetes Father   . Heart disease Mother        NOT before age 63-  Bypass  . Hypertension Mother   . Hypertension  Sister   . Varicose Veins Brother   . Heart disease Brother        Before age 42  . Hypertension Daughter   . Colon cancer Neg Hx   . Esophageal cancer Neg Hx   . Rectal cancer Neg Hx   . Stomach cancer Neg Hx     ROS Per hpi  Objective  Vitals as reported by the patient: per above  Gen: aaox3, nad Speaking in full sentences   ASSESSMENT and PLAN  1. Hypertensive urgency 2. Renal insufficiency Discussed separating BP meds AM: lasix and nifepedine Lunch: hydralazine PM: metoprolol and hydralazine D/c spironolactone  due to CKD Cont checking BP at home  FOLLOW-UP: 2 weeks   The above assessment and management plan was discussed with the patient. The patient verbalized understanding of and has agreed to the management plan. Patient is aware to call the clinic if symptoms persist or worsen. Patient is aware when to return to the clinic for a follow-up visit. Patient educated on when it is appropriate to go to the emergency department.     Rutherford Guys, MD Primary Care at [facility] [place], [place] [address] Ph.  [phone] Fax [phone]

## 2020-07-15 NOTE — Progress Notes (Signed)
Called to schedule two week in office follow up with provider. No VM option

## 2020-07-16 NOTE — Progress Notes (Signed)
Pt. Scheduled for visit with santiago

## 2020-07-20 DIAGNOSIS — Z20822 Contact with and (suspected) exposure to covid-19: Secondary | ICD-10-CM | POA: Diagnosis not present

## 2020-07-21 DIAGNOSIS — R079 Chest pain, unspecified: Secondary | ICD-10-CM | POA: Diagnosis not present

## 2020-07-21 DIAGNOSIS — R0600 Dyspnea, unspecified: Secondary | ICD-10-CM | POA: Diagnosis not present

## 2020-08-03 DIAGNOSIS — I639 Cerebral infarction, unspecified: Secondary | ICD-10-CM | POA: Diagnosis not present

## 2020-08-03 DIAGNOSIS — I509 Heart failure, unspecified: Secondary | ICD-10-CM | POA: Diagnosis not present

## 2020-08-03 DIAGNOSIS — I10 Essential (primary) hypertension: Secondary | ICD-10-CM | POA: Diagnosis not present

## 2020-08-06 ENCOUNTER — Ambulatory Visit (INDEPENDENT_AMBULATORY_CARE_PROVIDER_SITE_OTHER): Payer: Medicare HMO | Admitting: Family Medicine

## 2020-08-06 ENCOUNTER — Encounter: Payer: Self-pay | Admitting: Family Medicine

## 2020-08-06 ENCOUNTER — Other Ambulatory Visit: Payer: Self-pay

## 2020-08-06 VITALS — BP 140/80 | HR 78 | Temp 98.0°F | Ht 67.5 in | Wt 250.0 lb

## 2020-08-06 DIAGNOSIS — I10 Essential (primary) hypertension: Secondary | ICD-10-CM

## 2020-08-06 NOTE — Progress Notes (Signed)
9/16/20211:38 PM  Alicia [name] September 27, 1951, 69 y.o., female [id number]  Chief Complaint  Patient presents with  . Hypertension    HPI:   Patient is a 69 y.o. female with past medical history significant for HTN, DM2, HLP, PAD w hx L fem-pop bypass, CVA, dCHF, smoker, diverticulitis who presents today for BP followup  Last OV aug 2021 - discussed BP meds regime, d/c spironolactone due to CKD  She feels that new spacing of meds is working well Tolerating meds now, not feeling as dizzy/tired after taking her meds anymore Does not check BP at home often She is also cutting back on salt, eating less fast food Sees renal later this month She has no acute concerns today    Depression screen [facility] 2/9 06/15/2020 03/20/2020 01/06/2020  Decreased Interest 0 0 0  Down, Depressed, Hopeless 0 0 0  PHQ - 2 Score 0 0 0    Fall Risk  08/06/2020 06/15/2020 03/31/2020 03/20/2020 01/06/2020  Falls in the past year? 0 0 0 0 0  Number falls in past yr: 0 0 0 0 0  Injury with Fall? 0 0 0 0 0  Follow up - Falls evaluation completed Falls evaluation completed Falls evaluation completed -     Allergies  Allergen Reactions  . Atorvastatin Swelling    Lip swelling  . Nifedipine Er Other (See Comments)    Caused nose bleeds in higher doses (90 mg., namely)  . Latex Rash    Prior to Admission medications   Medication Sig Start Date End Date Taking? Authorizing Provider  acetaminophen (TYLENOL) 500 MG tablet Take 1,000 mg by mouth every 6 (six) hours as needed for moderate pain or headache.    Yes [provider]  aspirin 81 MG EC tablet Take 1 tablet (81 mg total) by mouth daily. 04/06/18  Yes Rutherford Guys, MD  blood glucose meter kit and supplies per insurance preference. Check cbg once a day. Dx E11.9 01/06/20  Yes Rutherford Guys, MD  fluticasone [facility]) 50 MCG/ACT nasal spray Place 1 spray into both nostrils 2 (two) times daily as needed for allergies or rhinitis. 03/20/20   Yes Rutherford Guys, MD  furosemide (LASIX) 40 MG tablet Take 1 tablet (40 mg total) by mouth daily. 06/15/20  Yes Rutherford Guys, MD  glimepiride (AMARYL) 1 MG tablet Take 1 tablet (1 mg total) by mouth daily with breakfast. 06/15/20  Yes Rutherford Guys, MD  hydrALAZINE (APRESOLINE) 100 MG tablet Take 0.5 tablets (50 mg total) by mouth 2 (two) times daily. 06/30/20  Yes Rutherford Guys, MD  isosorbide mononitrate (IMDUR) 30 MG 24 hr tablet Take 1 tablet (30 mg total) by mouth daily. 03/03/20  Yes Lavina Hamman, MD  metoprolol succinate (TOPROL XL) 50 MG 24 hr tablet Take 1 tablet (50 mg total) by mouth daily. Take with or immediately following a meal. 06/15/20  Yes Rutherford Guys, MD  nicotine (NICODERM CQ - DOSED IN MG/24 HOURS) 14 mg/24hr patch Place 1 patch (14 mg total) onto the skin daily. 03/03/20  Yes Lavina Hamman, MD  NIFEdipine (ADALAT CC) 60 MG 24 hr tablet Take 1 tablet (60 mg total) by mouth daily. 06/15/20  Yes Rutherford Guys, MD  OXYGEN Inhale into the lungs.   Yes [provider]  polyethylene glycol powder (GLYCOLAX/MIRALAX) 17 GM/SCOOP powder Take 17 g by mouth 2 (two) times daily as needed. 06/15/20  Yes Rutherford Guys, MD  rosuvastatin (  CRESTOR) 10 MG tablet Take 1 tablet (10 mg total) by mouth daily. 06/15/20  Yes Rutherford Guys, MD  sitaGLIPtin (JANUVIA) 50 MG tablet Take 1 tablet (50 mg total) by mouth daily. 06/15/20  Yes Rutherford Guys, MD    Past Medical History:  Diagnosis Date  . Allergy   . Arthritis   . Chronic headaches   . Constipation   . Depression   . Diabetes mellitus   . Hyperlipidemia   . Hypertension   . Obesity   . PAD (peripheral artery disease) ([place])   . Shortness of breath   . Stroke [facility])     Past Surgical History:  Procedure Laterality Date  . ABDOMINAL AORTOGRAM W/LOWER EXTREMITY N/A 12/17/2018   Procedure: ABDOMINAL AORTOGRAM W/LOWER EXTREMITY;  Surgeon: Waynetta Sandy, MD;  Location: [place] [facility];   Service: Cardiovascular;  Laterality: N/A;  . FEMORAL BYPASS  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 Bridgett Larsson  . PERIPHERAL VASCULAR ATHERECTOMY Left 12/17/2018   Procedure: PERIPHERAL VASCULAR ATHERECTOMY;  Surgeon: Waynetta Sandy, MD;  Location: [place] [facility];  Service: Cardiovascular;  Laterality: Left;  . PERIPHERAL VASCULAR BALLOON ANGIOPLASTY Left 12/17/2018   Procedure: PERIPHERAL VASCULAR BALLOON ANGIOPLASTY;  Surgeon: Waynetta Sandy, MD;  Location: [place] [facility];  Service: Cardiovascular;  Laterality: Left;    Social History   Tobacco Use  . Smoking status: Light Tobacco Smoker    Packs/day: 0.50    Years: 35.00    Pack years: 17.50    Types: Cigarettes  . Smokeless tobacco: Never Used  . Tobacco comment: patient is trying to quit and has gone down to half a pack per day  Substance Use Topics  . Alcohol use: Yes    Alcohol/week: 1.0 standard drink    Types: 1 Standard drinks or equivalent per week    Comment: occasional use only    Family History  Problem Relation Age of Onset  . Diabetes Father   . Heart disease Mother        NOT before age 57-  Bypass  . Hypertension Mother   . Hypertension Sister   . Varicose Veins Brother   . Heart disease Brother        Before age 44  . Hypertension Daughter   . Colon cancer Neg Hx   . Esophageal cancer Neg Hx   . Rectal cancer Neg Hx   . Stomach cancer Neg Hx     Review of Systems  Constitutional: Negative for chills and fever.  Respiratory: Negative for cough and shortness of breath.   Cardiovascular: Negative for chest pain, palpitations and leg swelling.  Gastrointestinal: Negative for abdominal pain, nausea and vomiting.   Per hpi  OBJECTIVE:  Today's Vitals   08/06/20 1329  BP: 140/80  Pulse: 78  Temp: 98 F (36.7 C)  SpO2: 96%  Weight: 250 lb (113.4 kg)  Height: 5' 7.5" (1.715 m)   Body mass index is 38.58 kg/m.   BP Readings from Last 3  Encounters:  08/06/20 140/80  07/14/20 (!) 146/79  06/29/20 (!) 170/82     Physical Exam Vitals and nursing note reviewed.  Constitutional:      Appearance: She is well-developed.  HENT:     Head: Normocephalic and atraumatic.     Mouth/Throat:     Pharynx: No oropharyngeal exudate.  Eyes:     General: No scleral icterus.    Extraocular Movements:  Extraocular movements intact.     Conjunctiva/sclera: Conjunctivae normal.     Pupils: Pupils are equal, round, and reactive to light.  Cardiovascular:     Rate and Rhythm: Normal rate and regular rhythm.     Heart sounds: Normal heart sounds. No murmur heard.  No friction rub. No gallop.   Pulmonary:     Effort: Pulmonary effort is normal.     Breath sounds: Normal breath sounds. No wheezing, rhonchi or rales.  Musculoskeletal:     Cervical back: Neck supple.  Skin:    General: Skin is warm and dry.  Neurological:     Mental Status: She is alert and oriented to person, place, and time.     No results found for this or any previous visit (from the past 24 hour(s)).  No results found.   ASSESSMENT and PLAN  1. Essential hypertension, benign Sign improved compliance and readings. Cont current regime for now. Will accept 140/90 or less as goal given long standing of non compliance due to intolerance  Return in about 3 months (around 11/05/2020).    Rutherford Guys, MD [facility] [place], [place] [address] Ph.  [phone] Fax [phone]

## 2020-08-06 NOTE — Patient Instructions (Signed)
° ° ° °  If you have lab work done today you will be contacted with your lab results within the next 2 weeks.  If you have not heard from us then please contact us. The fastest way to get your results is to register for My Chart. ° ° °IF you received an x-ray today, you will receive an invoice from [facility]. Please contact [facility] at [phone] with questions or concerns regarding your invoice.  ° °IF you received labwork today, you will receive an invoice from [facility]. Please contact [facility] at [phone] with questions or concerns regarding your invoice.  ° °Our billing staff will not be able to assist you with questions regarding bills from these companies. ° °You will be contacted with the lab results as soon as they are available. The fastest way to get your results is to activate your My Chart account. Instructions are located on the last page of this paperwork. If you have not heard from us regarding the results in 2 weeks, please contact this office. °  ° ° ° °

## 2020-08-11 ENCOUNTER — Other Ambulatory Visit: Payer: Self-pay | Admitting: Family Medicine

## 2020-08-11 DIAGNOSIS — Z21 Asymptomatic human immunodeficiency virus [HIV] infection status: Secondary | ICD-10-CM | POA: Diagnosis not present

## 2020-08-11 DIAGNOSIS — E1122 Type 2 diabetes mellitus with diabetic chronic kidney disease: Secondary | ICD-10-CM | POA: Diagnosis not present

## 2020-08-11 DIAGNOSIS — I129 Hypertensive chronic kidney disease with stage 1 through stage 4 chronic kidney disease, or unspecified chronic kidney disease: Secondary | ICD-10-CM | POA: Diagnosis not present

## 2020-08-11 DIAGNOSIS — I639 Cerebral infarction, unspecified: Secondary | ICD-10-CM | POA: Diagnosis not present

## 2020-08-11 DIAGNOSIS — I5030 Unspecified diastolic (congestive) heart failure: Secondary | ICD-10-CM | POA: Diagnosis not present

## 2020-08-11 DIAGNOSIS — Z720 Tobacco use: Secondary | ICD-10-CM | POA: Diagnosis not present

## 2020-08-11 DIAGNOSIS — N1832 Chronic kidney disease, stage 3b: Secondary | ICD-10-CM | POA: Diagnosis not present

## 2020-08-11 DIAGNOSIS — Z1231 Encounter for screening mammogram for malignant neoplasm of breast: Secondary | ICD-10-CM

## 2020-08-11 DIAGNOSIS — R801 Persistent proteinuria, unspecified: Secondary | ICD-10-CM | POA: Diagnosis not present

## 2020-08-11 DIAGNOSIS — I739 Peripheral vascular disease, unspecified: Secondary | ICD-10-CM | POA: Diagnosis not present

## 2020-08-11 DIAGNOSIS — N189 Chronic kidney disease, unspecified: Secondary | ICD-10-CM | POA: Diagnosis not present

## 2020-08-23 ENCOUNTER — Other Ambulatory Visit: Payer: Self-pay | Admitting: Family Medicine

## 2020-08-25 DIAGNOSIS — N1832 Chronic kidney disease, stage 3b: Secondary | ICD-10-CM | POA: Diagnosis not present

## 2020-08-31 DIAGNOSIS — I5030 Unspecified diastolic (congestive) heart failure: Secondary | ICD-10-CM | POA: Diagnosis not present

## 2020-08-31 DIAGNOSIS — I129 Hypertensive chronic kidney disease with stage 1 through stage 4 chronic kidney disease, or unspecified chronic kidney disease: Secondary | ICD-10-CM | POA: Diagnosis not present

## 2020-08-31 DIAGNOSIS — I739 Peripheral vascular disease, unspecified: Secondary | ICD-10-CM | POA: Diagnosis not present

## 2020-08-31 DIAGNOSIS — E785 Hyperlipidemia, unspecified: Secondary | ICD-10-CM | POA: Diagnosis not present

## 2020-08-31 DIAGNOSIS — E1122 Type 2 diabetes mellitus with diabetic chronic kidney disease: Secondary | ICD-10-CM | POA: Diagnosis not present

## 2020-08-31 DIAGNOSIS — Z720 Tobacco use: Secondary | ICD-10-CM | POA: Diagnosis not present

## 2020-08-31 DIAGNOSIS — N1832 Chronic kidney disease, stage 3b: Secondary | ICD-10-CM | POA: Diagnosis not present

## 2020-08-31 DIAGNOSIS — I639 Cerebral infarction, unspecified: Secondary | ICD-10-CM | POA: Diagnosis not present

## 2020-09-01 ENCOUNTER — Telehealth: Payer: Self-pay | Admitting: *Deleted

## 2020-09-01 ENCOUNTER — Ambulatory Visit: Payer: Medicare HMO | Admitting: Registered Nurse

## 2020-09-01 VITALS — BP 140/80 | Ht 67.5 in | Wt 250.0 lb

## 2020-09-01 DIAGNOSIS — Z13820 Encounter for screening for osteoporosis: Secondary | ICD-10-CM

## 2020-09-01 NOTE — Patient Instructions (Addendum)
Thank you for taking time to come for your Medicare Wellness Visit. I appreciate your ongoing commitment to your health goals. Please review the following plan we discussed and let me know if I can assist you in the future.  Leroy Kennedy LPN Advance Directive  Advance directives are legal documents that let you make choices ahead of time about your health care and medical treatment in case you become unable to communicate for yourself. Advance directives are a way for you to make known your wishes to family, friends, and health care providers. This can let others know about your end-of-life care if you become unable to communicate. Discussing and writing advance directives should happen over time rather than all at once. Advance directives can be changed depending on your situation and what you want, even after you have signed the advance directives. There are different types of advance directives, such as:  Medical power of attorney.  Living will.  Do not resuscitate (DNR) or do not attempt resuscitation (DNAR) order. Health care proxy and medical power of attorney A health care proxy is also called a health care agent. This is a person who is appointed to make medical decisions for you in cases where you are unable to make the decisions yourself. Generally, people choose someone they know well and trust to represent their preferences. Make sure to ask this person for an agreement to act as your proxy. A proxy may have to exercise judgment in the event of a medical decision for which your wishes are not known. A medical power of attorney is a legal document that names your health care proxy. Depending on the laws in your state, after the document is written, it may also need to be:  Signed.  Notarized.  Dated.  Copied.  Witnessed.  Incorporated into your medical record. You may also want to appoint someone to manage your money in a situation in which you are unable to do so. This is  called a durable power of attorney for finances. It is a separate legal document from the durable power of attorney for health care. You may choose the same person or someone different from your health care proxy to act as your agent in money matters. If you do not appoint a proxy, or if there is a concern that the proxy is not acting in your best interests, a court may appoint a guardian to act on your behalf. Living will A living will is a set of instructions that state your wishes about medical care when you cannot express them yourself. Health care providers should keep a copy of your living will in your medical record. You may want to give a copy to family members or friends. To alert caregivers in case of an emergency, you can place a card in your wallet to let them know that you have a living will and where they can find it. A living will is used if you become:  Terminally ill.  Disabled.  Unable to communicate or make decisions. Items to consider in your living will include:  To use or not to use life-support equipment, such as dialysis machines and breathing machines (ventilators).  A DNR or DNAR order. This tells health care providers not to use cardiopulmonary resuscitation (CPR) if breathing or heartbeat stops.  To use or not to use tube feeding.  To be given or not to be given food and fluids.  Comfort (palliative) care when the goal becomes comfort rather than a  a cure.  Donation of organs and tissues. A living will does not give instructions for distributing your money and property if you should pass away. DNR or DNAR A DNR or DNAR order is a request not to have CPR in the event that your heart stops beating or you stop breathing. If a DNR or DNAR order has not been made and shared, a health care provider will try to help any patient whose heart has stopped or who has stopped breathing. If you plan to have surgery, talk with your health care provider about how your DNR or DNAR  order will be followed if problems occur. What if I do not have an advance directive? If you do not have an advance directive, some states assign family decision makers to act on your behalf based on how closely you are related to them. Each state has its own laws about advance directives. You may want to check with your health care provider, attorney, or state representative about the laws in your state. Summary  Advance directives are the legal documents that allow you to make choices ahead of time about your health care and medical treatment in case you become unable to tell others about your care.  The process of discussing and writing advance directives should happen over time. You can change the advance directives, even after you have signed them.  Advance directives include DNR or DNAR orders, living wills, and designating an agent as your medical power of attorney. This information is not intended to replace advice given to you by your health care provider. Make sure you discuss any questions you have with your health care provider. Document Revised: 06/06/2019 Document Reviewed: 06/06/2019 Elsevier Patient Education  2020 [employer] Preventive Care 65 Years and Older, Female Preventive care refers to lifestyle choices and visits with your health care provider that can promote health and wellness. This includes:  A yearly physical exam. This is also called an annual well check.  Regular dental and eye exams.  Immunizations.  Screening for certain conditions.  Healthy lifestyle choices, such as diet and exercise. What can I expect for my preventive care visit? Physical exam Your health care provider will check:  Height and weight. These may be used to calculate body mass index (BMI), which is a measurement that tells if you are at a healthy weight.  Heart rate and blood pressure.  Your skin for abnormal spots. Counseling Your health care provider may ask you questions  about:  Alcohol, tobacco, and drug use.  Emotional well-being.  Home and relationship well-being.  Sexual activity.  Eating habits.  History of falls.  Memory and ability to understand (cognition).  Work and work environment.  Pregnancy and menstrual history. What immunizations do I need?  Influenza (flu) vaccine  This is recommended every year. Tetanus, diphtheria, and pertussis (Tdap) vaccine  You may need a Td booster every 10 years. Varicella (chickenpox) vaccine  You may need this vaccine if you have not already been vaccinated. Zoster (shingles) vaccine  You may need this after age 60. Pneumococcal conjugate (PCV13) vaccine  One dose is recommended after age 65. Pneumococcal polysaccharide (PPSV23) vaccine  One dose is recommended after age 65. Measles, mumps, and rubella (MMR) vaccine  You may need at least one dose of MMR if you were born in 1957 or later. You may also need a second dose. Meningococcal conjugate (MenACWY) vaccine  You may need this if you have certain conditions. Hepatitis A vaccine    You may need this if you have certain conditions or if you travel or work in places where you may be exposed to hepatitis A. Hepatitis B vaccine  You may need this if you have certain conditions or if you travel or work in places where you may be exposed to hepatitis B. Haemophilus influenzae type b (Hib) vaccine  You may need this if you have certain conditions. You may receive vaccines as individual doses or as more than one vaccine together in one shot (combination vaccines). Talk with your health care provider about the risks and benefits of combination vaccines. What tests do I need? Blood tests  Lipid and cholesterol levels. These may be checked every 5 years, or more frequently depending on your overall health.  Hepatitis C test.  Hepatitis B test. Screening  Lung cancer screening. You may have this screening every year starting at age 55 if  you have a 30-pack-year history of smoking and currently smoke or have quit within the past 15 years.  Colorectal cancer screening. All adults should have this screening starting at age 50 and continuing until age 75. Your health care provider may recommend screening at age 45 if you are at increased risk. You will have tests every 1-10 years, depending on your results and the type of screening test.  Diabetes screening. This is done by checking your blood sugar (glucose) after you have not eaten for a while (fasting). You may have this done every 1-3 years.  Mammogram. This may be done every 1-2 years. Talk with your health care provider about how often you should have regular mammograms.  BRCA-related cancer screening. This may be done if you have a family history of breast, ovarian, tubal, or peritoneal cancers. Other tests  Sexually transmitted disease (STD) testing.  Bone density scan. This is done to screen for osteoporosis. You may have this done starting at age 65. Follow these instructions at home: Eating and drinking  Eat a diet that includes fresh fruits and vegetables, whole grains, lean protein, and low-fat dairy products. Limit your intake of foods with high amounts of sugar, saturated fats, and salt.  Take vitamin and mineral supplements as recommended by your health care provider.  Do not drink alcohol if your health care provider tells you not to drink.  If you drink alcohol: ? Limit how much you have to 0-1 drink a day. ? Be aware of how much alcohol is in your drink. In the [place] one drink equals one 12 oz bottle of beer (355 mL), one 5 oz glass of wine (148 mL), or one 1 oz glass of hard liquor (44 mL). Lifestyle  Take daily care of your teeth and gums.  Stay active. Exercise for at least 30 minutes on 5 or more days each week.  Do not use any products that contain nicotine or tobacco, such as cigarettes, e-cigarettes, and chewing tobacco. If you need help  quitting, ask your health care provider.  If you are sexually active, practice safe sex. Use a condom or other form of protection in order to prevent STIs (sexually transmitted infections).  Talk with your health care provider about taking a low-dose aspirin or statin. What's next?  Go to your health care provider once a year for a well check visit.  Ask your health care provider how often you should have your eyes and teeth checked.  Stay up to date on all vaccines. This information is not intended to replace advice given to you   by your health care provider. Make sure you discuss any questions you have with your health care provider. Document Revised: 11/01/2018 Document Reviewed: 11/01/2018 Elsevier Patient Education  2020 [employer] Inc.  

## 2020-09-01 NOTE — Progress Notes (Signed)
Presents today for [employer] Visit   Date of last exam: 08-06-2020  Interpreter used for this visit?  No  I connected with  Alicia [name] on 09/01/20 by a telephone application and verified that I am speaking with the correct person using two identifiers.   I discussed the limitations of evaluation and management by telemedicine. The patient expressed understanding and agreed to proceed.  Patient location: home  Provider location: in office  I provided 20 minutes of non face - to - face time during this encounter.  Patient Care Team: Rutherford Guys, MD as PCP - General (Family Medicine) Nigel Mormon, MD as Consulting Physician (Cardiology)   Other items to address today:   Discussed Eye/Dental Discussed immunizations Will schedule TOC with [facility]   Other Screening: Last screening for diabetes: 06-15-20 Last lipid screening: 06/15/20  ADVANCE DIRECTIVES: Discussed: yes On File: no Materials Provided: yes  Immunization status:  Immunization History  Administered Date(s) Administered  . Moderna SARS-COVID-2 Vaccination 03/21/2020, 04/21/2020  . Pneumococcal Conjugate-13 11/12/2018  . Pneumococcal Polysaccharide-23 01/05/2020, 01/06/2020     Health Maintenance Due  Topic Date Due  . DEXA SCAN  Never done  . OPHTHALMOLOGY EXAM  07/25/2019  . MAMMOGRAM  10/18/2019     Functional Status Survey: Is the patient deaf or have difficulty hearing?: No Does the patient have difficulty seeing, even when wearing glasses/contacts?: No Does the patient have difficulty concentrating, remembering, or making decisions?: No Does the patient have difficulty walking or climbing stairs?: No Does the patient have difficulty dressing or bathing?: No Does the patient have difficulty doing errands alone such as visiting a doctor's office or shopping?: Yes (daughters helping)   6CIT Screen 09/01/2020  What Year? 0 points  What month? 0 points    What time? 0 points  Count back from 20 0 points  Months in reverse 0 points  Repeat phrase 0 points  Total Score 0        Clinical Support from 09/01/2020 in [facility] [place]  AUDIT-C Score 3       Home Environment:   Lives in a two story home Yes trouble climbing stairs (leg weakness) No scattered rugs No grab bars Adequate lighting/ no clutter Daughter helps with getting to appointments Patient states doing better with preparing healthier foods   Patient Active Problem List   Diagnosis Date Noted  . Exacerbation of RAD (reactive airway disease) 02/27/2020  . Hypertensive urgency 02/27/2020  . CKD (chronic kidney disease) stage 3, GFR 30-59 ml/min (HCC) 02/27/2020  . Tobacco use 02/27/2020  . Hypertensive emergency 02/27/2020  . Prolonged Q-T interval on ECG 04/27/2018  . CHF (congestive heart failure) ([place]) 04/27/2018  . Weakness 04/03/2018  . Syncope 04/03/2018  . Renal insufficiency 03/16/2018  . Diabetes mellitus type 2 in obese ([place]) 03/16/2018  . Morbid obesity ([place])   . CVA (cerebral vascular accident) ([place]) 03/15/2018  . Numbness in both hands- and Feet 06/06/2014  . Weakness of both legs 06/06/2014  . Swelling of limb-Left Leg > Right 06/06/2014  . Peripheral vascular disease, unspecified ([place]) 11/30/2012  . Pain in limb 11/30/2012  . Atherosclerosis of native arteries of the extremities with intermittent claudication 04/06/2012  . CHEST PAIN 01/03/2011  . Hyperlipidemia 07/20/2007  . Essential hypertension, benign 07/20/2007  . ARTHRITIS 07/20/2007     Past Medical History:  Diagnosis Date  . Allergy   . Arthritis   . Chronic headaches   .  Constipation   . Depression   . Diabetes mellitus   . Hyperlipidemia   . Hypertension   . Obesity   . PAD (peripheral artery disease) ([place])   . Shortness of breath   . Stroke [facility])      Past Surgical History:  Procedure Laterality Date  . ABDOMINAL AORTOGRAM W/LOWER EXTREMITY N/A 12/17/2018    Procedure: ABDOMINAL AORTOGRAM W/LOWER EXTREMITY;  Surgeon: Waynetta Sandy, MD;  Location: [place] [facility];  Service: Cardiovascular;  Laterality: N/A;  . FEMORAL BYPASS  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 Bridgett Larsson  . PERIPHERAL VASCULAR ATHERECTOMY Left 12/17/2018   Procedure: PERIPHERAL VASCULAR ATHERECTOMY;  Surgeon: Waynetta Sandy, MD;  Location: [place] [facility];  Service: Cardiovascular;  Laterality: Left;  . PERIPHERAL VASCULAR BALLOON ANGIOPLASTY Left 12/17/2018   Procedure: PERIPHERAL VASCULAR BALLOON ANGIOPLASTY;  Surgeon: Waynetta Sandy, MD;  Location: [place] [facility];  Service: Cardiovascular;  Laterality: Left;     Family History  Problem Relation Age of Onset  . Diabetes Father   . Heart disease Mother        NOT before age 88-  Bypass  . Hypertension Mother   . Hypertension Sister   . Varicose Veins Brother   . Heart disease Brother        Before age 51  . Hypertension Daughter   . Colon cancer Neg Hx   . Esophageal cancer Neg Hx   . Rectal cancer Neg Hx   . Stomach cancer Neg Hx      Social History   Socioeconomic History  . Marital status: Single    Spouse name: Not on file  . Number of children: Not on file  . Years of education: Not on file  . Highest education level: Not on file  Occupational History  . Not on file  Tobacco Use  . Smoking status: Light Tobacco Smoker    Packs/day: 0.50    Years: 35.00    Pack years: 17.50    Types: Cigarettes  . Smokeless tobacco: Never Used  . Tobacco comment: patient is trying to quit and has gone down to half a pack per day  Vaping Use  . Vaping Use: Never used  Substance and Sexual Activity  . Alcohol use: Yes    Alcohol/week: 1.0 standard drink    Types: 1 Standard drinks or equivalent per week    Comment: occasional use only  . Drug use: No  . Sexual activity: Not Currently  Other Topics Concern  . Not on file  Social History  Narrative  . Not on file   Social Determinants of Health   Financial Resource Strain:   . Difficulty of Paying Living Expenses: Not on file  Food Insecurity:   . Worried About Charity fundraiser in the Last Year: Not on file  . Ran Out of Food in the Last Year: Not on file  Transportation Needs:   . Lack of Transportation (Medical): Not on file  . Lack of Transportation (Non-Medical): Not on file  Physical Activity:   . Days of Exercise per Week: Not on file  . Minutes of Exercise per Session: Not on file  Stress:   . Feeling of Stress : Not on file  Social Connections:   . Frequency of Communication with Friends and Family: Not on file  . Frequency of Social Gatherings with Friends and Family: Not on file  . Attends Religious  Services: Not on file  . Active Member of Clubs or Organizations: Not on file  . Attends Archivist Meetings: Not on file  . Marital Status: Not on file  Intimate Partner Violence:   . Fear of Current or Ex-Partner: Not on file  . Emotionally Abused: Not on file  . Physically Abused: Not on file  . Sexually Abused: Not on file     Allergies  Allergen Reactions  . Atorvastatin Swelling    Lip swelling  . Nifedipine Er Other (See Comments)    Caused nose bleeds in higher doses (90 mg., namely)  . Latex Rash     Prior to Admission medications   Medication Sig Start Date End Date Taking? Authorizing Provider  acetaminophen (TYLENOL) 500 MG tablet Take 1,000 mg by mouth every 6 (six) hours as needed for moderate pain or headache.    Yes [provider]  aspirin 81 MG EC tablet Take 1 tablet (81 mg total) by mouth daily. 04/06/18  Yes Rutherford Guys, MD  blood glucose meter kit and supplies per insurance preference. Check cbg once a day. Dx E11.9 01/06/20  Yes Rutherford Guys, MD  fluticasone [facility]) 50 MCG/ACT nasal spray Place 1 spray into both nostrils 2 (two) times daily as needed for allergies or rhinitis. 03/20/20  Yes  Rutherford Guys, MD  furosemide (LASIX) 40 MG tablet Take 1 tablet (40 mg total) by mouth daily. 06/15/20  Yes Rutherford Guys, MD  glimepiride (AMARYL) 1 MG tablet Take 1 tablet (1 mg total) by mouth daily with breakfast. 06/15/20  Yes Rutherford Guys, MD  irbesartan (AVAPRO) 75 MG tablet Take 75 mg by mouth daily. Take one daily   Yes [provider]  metoprolol succinate (TOPROL XL) 50 MG 24 hr tablet Take 1 tablet (50 mg total) by mouth daily. Take with or immediately following a meal. 06/15/20  Yes Rutherford Guys, MD  NIFEdipine (ADALAT CC) 60 MG 24 hr tablet Take 1 tablet (60 mg total) by mouth daily. 06/15/20  Yes Rutherford Guys, MD  OXYGEN Inhale into the lungs.   Yes [provider]  polyethylene glycol powder (GLYCOLAX/MIRALAX) 17 GM/SCOOP powder Take 17 g by mouth 2 (two) times daily as needed. 06/15/20  Yes Rutherford Guys, MD  rosuvastatin (CRESTOR) 10 MG tablet Take 1 tablet (10 mg total) by mouth daily. 06/15/20  Yes Rutherford Guys, MD  sitaGLIPtin (JANUVIA) 50 MG tablet Take 1 tablet (50 mg total) by mouth daily. 06/15/20  Yes Rutherford Guys, MD  hydrALAZINE (APRESOLINE) 100 MG tablet Take 0.5 tablets (50 mg total) by mouth 2 (two) times daily. Patient not taking: Reported on 09/01/2020 06/30/20   Rutherford Guys, MD  isosorbide mononitrate (IMDUR) 30 MG 24 hr tablet Take 1 tablet (30 mg total) by mouth daily. Patient not taking: Reported on 09/01/2020 03/03/20   Lavina Hamman, MD  nicotine (NICODERM CQ - DOSED IN MG/24 HOURS) 14 mg/24hr patch Place 1 patch (14 mg total) onto the skin daily. Patient not taking: Reported on 09/01/2020 03/03/20   Lavina Hamman, MD     Depression screen [facility] 2/9 09/01/2020 06/15/2020 03/20/2020 01/06/2020 05/10/2019  Decreased Interest 0 0 0 0 0  Down, Depressed, Hopeless 0 0 0 0 0  PHQ - 2 Score 0 0 0 0 0     Fall Risk  09/01/2020 08/06/2020 06/15/2020 03/31/2020 03/20/2020  Falls in the past year? 0 0 0 0 0  Number falls in  past yr: 0 0 0 0 0  Injury with Fall? 0 0 0 0 0  Follow up Falls evaluation completed;Education provided - Falls evaluation completed Falls evaluation completed Falls evaluation completed      PHYSICAL EXAM: BP 140/80 Comment: not in clinic/ taken from a previous visit  Ht 5' 7.5" (1.715 m)   Wt 250 lb (113.4 kg)   BMI 38.58 kg/m    Wt Readings from Last 3 Encounters:  09/01/20 250 lb (113.4 kg)  08/06/20 250 lb (113.4 kg)  07/14/20 249 lb (112.9 kg)       Education/Counseling provided regarding diet and exercise, prevention of chronic diseases, smoking/tobacco cessation, if applicable, and reviewed "Covered Medicare Preventive Services."   ASSESSMENT/PLAN: 1. Encounter for screening for osteoporosis - DG Bone Density  Other orders - irbesartan (AVAPRO) 75 MG tablet; Take 75 mg by mouth daily. Take one daily    Added by kidney doctor

## 2020-09-01 NOTE — Telephone Encounter (Signed)
Patient needs a 3 month follow up around 12/16.    TOC with [facility].

## 2020-09-02 DIAGNOSIS — I509 Heart failure, unspecified: Secondary | ICD-10-CM | POA: Diagnosis not present

## 2020-09-02 DIAGNOSIS — I10 Essential (primary) hypertension: Secondary | ICD-10-CM | POA: Diagnosis not present

## 2020-09-02 DIAGNOSIS — I639 Cerebral infarction, unspecified: Secondary | ICD-10-CM | POA: Diagnosis not present

## 2020-09-03 NOTE — Telephone Encounter (Signed)
Called pt and scheduled appt

## 2020-09-14 DIAGNOSIS — N1832 Chronic kidney disease, stage 3b: Secondary | ICD-10-CM | POA: Diagnosis not present

## 2020-10-03 DIAGNOSIS — I509 Heart failure, unspecified: Secondary | ICD-10-CM | POA: Diagnosis not present

## 2020-10-03 DIAGNOSIS — I639 Cerebral infarction, unspecified: Secondary | ICD-10-CM | POA: Diagnosis not present

## 2020-10-03 DIAGNOSIS — I10 Essential (primary) hypertension: Secondary | ICD-10-CM | POA: Diagnosis not present

## 2020-10-12 DIAGNOSIS — I739 Peripheral vascular disease, unspecified: Secondary | ICD-10-CM | POA: Diagnosis not present

## 2020-10-12 DIAGNOSIS — N1832 Chronic kidney disease, stage 3b: Secondary | ICD-10-CM | POA: Diagnosis not present

## 2020-10-12 DIAGNOSIS — E1122 Type 2 diabetes mellitus with diabetic chronic kidney disease: Secondary | ICD-10-CM | POA: Diagnosis not present

## 2020-10-12 DIAGNOSIS — E785 Hyperlipidemia, unspecified: Secondary | ICD-10-CM | POA: Diagnosis not present

## 2020-10-12 DIAGNOSIS — Z720 Tobacco use: Secondary | ICD-10-CM | POA: Diagnosis not present

## 2020-10-12 DIAGNOSIS — I5030 Unspecified diastolic (congestive) heart failure: Secondary | ICD-10-CM | POA: Diagnosis not present

## 2020-10-12 DIAGNOSIS — R011 Cardiac murmur, unspecified: Secondary | ICD-10-CM | POA: Diagnosis not present

## 2020-10-12 DIAGNOSIS — I129 Hypertensive chronic kidney disease with stage 1 through stage 4 chronic kidney disease, or unspecified chronic kidney disease: Secondary | ICD-10-CM | POA: Diagnosis not present

## 2020-10-13 ENCOUNTER — Other Ambulatory Visit: Payer: Self-pay | Admitting: Internal Medicine

## 2020-10-13 DIAGNOSIS — I129 Hypertensive chronic kidney disease with stage 1 through stage 4 chronic kidney disease, or unspecified chronic kidney disease: Secondary | ICD-10-CM

## 2020-10-13 DIAGNOSIS — N1832 Chronic kidney disease, stage 3b: Secondary | ICD-10-CM

## 2020-11-02 DIAGNOSIS — I639 Cerebral infarction, unspecified: Secondary | ICD-10-CM | POA: Diagnosis not present

## 2020-11-02 DIAGNOSIS — I10 Essential (primary) hypertension: Secondary | ICD-10-CM | POA: Diagnosis not present

## 2020-11-02 DIAGNOSIS — I509 Heart failure, unspecified: Secondary | ICD-10-CM | POA: Diagnosis not present

## 2020-11-03 ENCOUNTER — Other Ambulatory Visit: Payer: Self-pay | Admitting: Family Medicine

## 2020-11-05 ENCOUNTER — Other Ambulatory Visit: Payer: Medicare HMO

## 2020-11-06 ENCOUNTER — Ambulatory Visit
Admission: RE | Admit: 2020-11-06 | Discharge: 2020-11-06 | Disposition: A | Discharge status: Home / Self Care | Payer: Medicare HMO | Source: Ambulatory Visit | Attending: Internal Medicine | Admitting: Internal Medicine

## 2020-11-06 DIAGNOSIS — N1832 Chronic kidney disease, stage 3b: Secondary | ICD-10-CM

## 2020-11-06 DIAGNOSIS — I129 Hypertensive chronic kidney disease with stage 1 through stage 4 chronic kidney disease, or unspecified chronic kidney disease: Secondary | ICD-10-CM

## 2020-11-06 DIAGNOSIS — Z87448 Personal history of other diseases of urinary system: Secondary | ICD-10-CM | POA: Diagnosis not present

## 2020-12-03 DIAGNOSIS — I10 Essential (primary) hypertension: Secondary | ICD-10-CM | POA: Diagnosis not present

## 2020-12-03 DIAGNOSIS — I509 Heart failure, unspecified: Secondary | ICD-10-CM | POA: Diagnosis not present

## 2020-12-03 DIAGNOSIS — I639 Cerebral infarction, unspecified: Secondary | ICD-10-CM | POA: Diagnosis not present

## 2020-12-07 ENCOUNTER — Other Ambulatory Visit: Payer: Self-pay

## 2020-12-07 ENCOUNTER — Encounter: Payer: Self-pay | Admitting: Family Medicine

## 2020-12-07 ENCOUNTER — Telehealth (INDEPENDENT_AMBULATORY_CARE_PROVIDER_SITE_OTHER): Payer: Medicare HMO | Admitting: Family Medicine

## 2020-12-07 DIAGNOSIS — Z1231 Encounter for screening mammogram for malignant neoplasm of breast: Secondary | ICD-10-CM

## 2020-12-07 DIAGNOSIS — E1169 Type 2 diabetes mellitus with other specified complication: Secondary | ICD-10-CM | POA: Diagnosis not present

## 2020-12-07 DIAGNOSIS — Z780 Asymptomatic menopausal state: Secondary | ICD-10-CM

## 2020-12-07 DIAGNOSIS — I5032 Chronic diastolic (congestive) heart failure: Secondary | ICD-10-CM | POA: Diagnosis not present

## 2020-12-07 DIAGNOSIS — N1831 Chronic kidney disease, stage 3a: Secondary | ICD-10-CM | POA: Diagnosis not present

## 2020-12-07 DIAGNOSIS — M129 Arthropathy, unspecified: Secondary | ICD-10-CM

## 2020-12-07 DIAGNOSIS — E669 Obesity, unspecified: Secondary | ICD-10-CM | POA: Diagnosis not present

## 2020-12-07 MED ORDER — TRULICITY 0.75 MG/0.5ML ~~LOC~~ SOAJ: 0.7500 mg | SUBCUTANEOUS | 6 refills | Status: DC

## 2020-12-07 MED ORDER — JARDIANCE 10 MG PO TABS: 10.0000 mg | ORAL_TABLET | Freq: Every day | ORAL | 3 refills | Status: DC

## 2020-12-07 NOTE — Progress Notes (Signed)
Virtual Visit Note  I connected with patient on 12/07/20 at 1045 by telephone due to unable to work Epic video visit and verified that I am speaking with the correct person using two identifiers. Alicia [name] is currently located at home and no family members are currently with them during visit. The provider, Laurita Quint Aya Geisel, FNP is located in their office at time of visit.  I discussed the limitations, risks, security and privacy concerns of performing an evaluation and management service by telephone and the availability of in person appointments. I also discussed with the patient that there may be a patient responsible charge related to this service. The patient expressed understanding and agreed to proceed.   I provided 40 minutes of non-face-to-face time during this encounter.  Chief Complaint  Patient presents with  . Establish Care    3 month f/u      HPI ? Patient is a 70 y.o. female with past medical history significant for HTN, DM2, HLP, PAD w hx L fem-pop bypass, CVA, dCHF, smoker, diverticulitis who presents today for routine follow-up.  Patient Care Team: Jacelyn Pi, Lilia Argue, MD as PCP - General (Family Medicine) Nigel Mormon, MD as Consulting Physician (Cardiology) Nephrology: Dr, Kelvin [facility]  Having leg pain (arthritis) Using tylenol and external gel Noticed a know on her wrist  HTN Nifedipine  Chlorthalidone 50m Irbesartan 730mdaily  Furosemide (not currently taking) Metoprolol 5049maily  BP runs high at home Takes medication most days  BP Readings from Last 3 Encounters:  09/01/20 140/80  08/06/20 140/80  07/14/20 (!) 146/79    DM Januvia  Jardiance (has this medication but not taking due to concern for side effects) Eye exam? Needs, multiple years ago Crestor [id number] Numbness of her feet  Lab Results  Component Value Date   HGBA1C 7.0 (H) 06/15/2020   Lab Results  Component Value Date   CHOL 187  06/15/2020   HDL 52 06/15/2020   LDLCALC 114 (H) 06/15/2020   TRIG 115 06/15/2020   CHOLHDL 3.6 06/15/2020     Health Maintenance  Topic Date Due  . DEXA SCAN  Never done  . OPHTHALMOLOGY EXAM  07/25/2019  . MAMMOGRAM  10/18/2019  . Hepatitis C Screening  12/30/2020 (Originally 12/1711-May-1952 HEMOGLOBIN A1C  12/16/2020  . FOOT EXAM  03/31/2021  . COVID-19 Vaccine (4 - Booster for Moderna series) 05/17/2021  . COLONOSCOPY (Pts 45-71yr47yrurance coverage will need to be confirmed)  06/24/2026  . TETANUS/TDAP  04/06/2028  . PNA vac Low Risk Adult  Completed  . INFLUENZA VACCINE  Discontinued   Dexa: Needs Mammogram: Needs   Allergies  Allergen Reactions  . Atorvastatin Swelling    Lip swelling  . Nifedipine Er Other (See Comments)    Caused nose bleeds in higher doses (90 mg., namely)  . Latex Rash    Prior to Admission medications   Medication Sig Start Date End Date Taking? Authorizing Provider  acetaminophen (TYLENOL) 500 MG tablet Take 1,000 mg by mouth every 6 (six) hours as needed for moderate pain or headache.    Yes [provider]  aspirin 81 MG EC tablet Take 1 tablet (81 mg total) by mouth daily. 04/06/18  Yes SantiJacelyn Pia Lilia Argue blood glucose meter kit and supplies per insurance preference. Check cbg once a day. Dx E11.9 01/06/20  Yes SantiJacelyn Pia Lilia Argue fluticasone (FLONVirt[facility]ancel[facility] CountyMCG/ACT nasal spray Place 1 spray into  both nostrils 2 (two) times daily as needed for allergies or rhinitis. 03/20/20  Yes Jacelyn Pi, Lilia Argue, MD  furosemide (LASIX) 40 MG tablet Take 1 tablet (40 mg total) by mouth daily. 06/15/20  Yes Jacelyn Pi, Irma M, MD  irbesartan (AVAPRO) 75 MG tablet Take 75 mg by mouth daily. Take one daily   Yes [provider]  metoprolol succinate (TOPROL XL) 50 MG 24 hr tablet Take 1 tablet (50 mg total) by mouth daily. Take with or immediately following a meal. 06/15/20  Yes Jacelyn Pi, Lilia Argue, MD  NIFEdipine (ADALAT CC)  60 MG 24 hr tablet Take 1 tablet (60 mg total) by mouth daily. 06/15/20  Yes Jacelyn Pi, Lilia Argue, MD  OXYGEN Inhale into the lungs.   Yes [provider]  polyethylene glycol powder (GLYCOLAX/MIRALAX) 17 GM/SCOOP powder Take 17 g by mouth 2 (two) times daily as needed. 06/15/20  Yes Jacelyn Pi, Lilia Argue, MD  rosuvastatin (CRESTOR) 10 MG tablet Take 1 tablet (10 mg total) by mouth daily. 06/15/20  Yes Jacelyn Pi, Irma M, MD  sitaGLIPtin (JANUVIA) 50 MG tablet Take 1 tablet (50 mg total) by mouth daily. 06/15/20  Yes Jacelyn Pi, Lilia Argue, MD  chlorthalidone (HYGROTON) 25 MG tablet  10/13/20   [provider]  ibuprofen (ADVIL) 600 MG tablet  09/29/20   [provider]  JARDIANCE 10 MG TABS tablet  10/12/20   [provider]  NIFEdipine (PROCARDIA XL/NIFEDICAL XL) 60 MG 24 hr tablet  09/13/20   [provider]    Past Medical History:  Diagnosis Date  . Allergy   . Arthritis   . Chronic headaches   . Constipation   . Depression   . Diabetes mellitus   . Hyperlipidemia   . Hypertension   . Obesity   . PAD (peripheral artery disease) ([place])   . Shortness of breath   . Stroke [facility])     Past Surgical History:  Procedure Laterality Date  . ABDOMINAL AORTOGRAM W/LOWER EXTREMITY N/A 12/17/2018   Procedure: ABDOMINAL AORTOGRAM W/LOWER EXTREMITY;  Surgeon: Waynetta Sandy, MD;  Location: [place] [facility];  Service: Cardiovascular;  Laterality: N/A;  . FEMORAL BYPASS  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 Bridgett Larsson  . PERIPHERAL VASCULAR ATHERECTOMY Left 12/17/2018   Procedure: PERIPHERAL VASCULAR ATHERECTOMY;  Surgeon: Waynetta Sandy, MD;  Location: [place] [facility];  Service: Cardiovascular;  Laterality: Left;  . PERIPHERAL VASCULAR BALLOON ANGIOPLASTY Left 12/17/2018   Procedure: PERIPHERAL VASCULAR BALLOON ANGIOPLASTY;  Surgeon: Waynetta Sandy, MD;  Location: [place] [facility];   Service: Cardiovascular;  Laterality: Left;    Social History   Tobacco Use  . Smoking status: Light Tobacco Smoker    Packs/day: 0.50    Years: 35.00    Pack years: 17.50    Types: Cigarettes  . Smokeless tobacco: Never Used  . Tobacco comment: patient is trying to quit and has gone down to half a pack per day  Substance Use Topics  . Alcohol use: Yes    Alcohol/week: 1.0 standard drink    Types: 1 Standard drinks or equivalent per week    Comment: occasional use only    Family History  Problem Relation Age of Onset  . Diabetes Father   . Heart disease Mother        NOT before age 78-  Bypass  . Hypertension Mother   . Hypertension Sister   .  Varicose Veins Brother   . Heart disease Brother        Before age 2  . Hypertension Daughter   . Colon cancer Neg Hx   . Esophageal cancer Neg Hx   . Rectal cancer Neg Hx   . Stomach cancer Neg Hx     Review of Systems  Constitutional: Negative for chills, fever and malaise/fatigue.  Eyes: Negative for blurred vision and double vision.  Respiratory: Negative for cough, shortness of breath and wheezing.   Cardiovascular: Negative for chest pain, palpitations and leg swelling.  Gastrointestinal: Negative for abdominal pain, blood in stool, constipation, diarrhea, heartburn, nausea and vomiting.  Genitourinary: Negative for dysuria, frequency and hematuria.  Musculoskeletal: Positive for joint pain (knee pain). Negative for back pain.  Skin: Negative for rash.       Cyst to her wrist, non-painful  Neurological: Positive for dizziness. Negative for weakness and headaches.    Objective  Constitutional:      General: Not in acute distress.    Appearance: Normal appearance. Not ill-appearing.   Pulmonary:     Effort: Pulmonary effort is normal. No respiratory distress.  Neurological:     Mental Status: Alert and oriented to person, place, and time.  Psychiatric:        Mood and Affect: Mood normal.        Behavior:  Behavior normal.     ASSESSMENT and PLAN  Problem List Items Addressed This Visit      Cardiovascular and Mediastinum   CHF (congestive heart failure) (HCC)   Relevant Medications   chlorthalidone (HYGROTON) 25 MG tablet   NIFEdipine (PROCARDIA XL/NIFEDICAL XL) 60 MG 24 hr tablet     Endocrine   Diabetes mellitus type 2 in obese (HCC) - Primary   Relevant Medications   Dulaglutide (TRULICITY) 1.82 EQ/3.3VO SOPN   JARDIANCE 10 MG TABS tablet   Other Relevant Orders   Ambulatory referral to Ophthalmology     Musculoskeletal and Integument   Arthropathy     Genitourinary   CKD (chronic kidney disease) stage 3, GFR 30-59 ml/min (HCC)    Other Visit Diagnoses    Postmenopausal estrogen deficiency       Relevant Orders   DG Bone Density   Encounter for screening mammogram for malignant neoplasm of breast       Relevant Orders   MS DIGITAL SCREENING TOMO BILATERAL      Plan . Restart Jardiance today . In 2 weeks stop Januvia and start weekly trulicity . Eventually transition from nifedipine to amlodipine . Increase Irbesartan as needed for BP management . Needs: Dexa scan, mammogram, yearly eye exam . Labs next visit (A1c, lipid panel) . Declined ortho referral at this time for arthritis pain management (limited medication options due to DM and CKD) Continue to use Tylenol and external gels at this time  Return in about 2 weeks (around 12/21/2020) for BP followup.   The above assessment and management plan was discussed with the patient. The patient verbalized understanding of and has agreed to the management plan. Patient is aware to call the clinic if symptoms persist or worsen. Patient is aware when to return to the clinic for a follow-up visit. Patient educated on when it is appropriate to go to the emergency department.     Huston Foley Corinthian Mizrahi, FNP-BC Primary Care at [facility], [place] [address] Ph.  [phone] Fax [phone]

## 2020-12-07 NOTE — Patient Instructions (Addendum)
Chronic Kidney Disease, Adult Chronic kidney disease is when lasting damage happens to the kidneys slowly over a long time. The kidneys help to:  Make pee (urine).  Make hormones.  Keep the right amount of fluids and chemicals in the body. Most often, this disease does not go away. You must take steps to help keep the kidney damage from getting worse. If steps are not taken, the kidneys might stop working forever. What are the causes?  Diabetes.  High blood pressure.  Diseases that affect the heart and blood vessels.  Other kidney diseases.  Diseases of the body's disease-fighting system.  A problem with the flow of pee.  Infections of the organs that make pee, store it, and take it out of the body.  Swelling or irritation of your blood vessels. What increases the risk?  Getting older.  Having someone in your family who has kidney disease or kidney failure.  Having a disease caused by genes.  Taking medicines often that harm the kidneys.  Being near or having contact with harmful substances.  Being very overweight.  Using tobacco now or in the past. What are the signs or symptoms?  Feeling very tired.  Having a swollen face, legs, ankles, or feet.  Feeling like you may vomit or vomiting.  Not feeling hungry.  Being confused or not able to focus.  Twitches and cramps in the leg muscles or other muscles.  Dry, itchy skin.  A taste of metal in your mouth.  Making less pee, or making more pee.  Shortness of breath.  Trouble sleeping. You may also become anemic or get weak bones. Anemic means there is not enough red blood cells or hemoglobin in your blood. You may get symptoms slowly. You may not notice them until the kidney damage gets very bad. How is this treated? Often, there is no cure for this disease. Treatment can help with symptoms and help keep the disease from getting worse. You may need to:  Avoid alcohol.  Avoid foods that are high in  salt, potassium, phosphorous, and protein.  Take medicines for symptoms and to help control other conditions.  Have dialysis. This treatment gets harmful waste out of your body.  Treat other problems that cause your kidney disease or make it worse. Follow these instructions at home: Medicines  Take over-the-counter and prescription medicines only as told by your doctor.  Do not take any new medicines, vitamins, or supplements unless your doctor says it is okay. Lifestyle  Do not smoke or use any products that contain nicotine or tobacco. If you need help quitting, ask your doctor.  If you drink alcohol: ? Limit how much you use to:  0-1 drink a day for women who are not pregnant.  0-2 drinks a day for men. ? Know how much alcohol is in your drink. In the [place] one drink equals one 12 oz bottle of beer (355 mL), one 5 oz glass of wine (148 mL), or one 1 oz glass of hard liquor (44 mL).  Stay at a healthy weight. If you need help losing weight, ask your doctor.   General instructions  Follow instructions from your doctor about what you cannot eat or drink.  Track your blood pressure at home. Tell your doctor about any changes.  If you have diabetes, track your blood sugar.  Exercise at least 30 minutes a day, 5 days a week.  Keep your shots (vaccinations) up to date.  Keep all follow-up visits.  Where to find more information  American Association of Kidney Patients: www.aakp.org  National Kidney Foundation: www.kidney.org  [employer]: www.akfinc.org  [employer]: www.lifeoptions.org  [employer]: www.kidneyschool.org Contact a doctor if:  Your symptoms get worse.  You get new symptoms. Get help right away if:  You get symptoms of end-stage kidney disease. These include: ? Headaches. ? Losing feeling in your hands or feet. ? Easy bruising. ? Having hiccups often. ? Chest pain. ? Shortness of breath. ? Lack of menstrual periods, in  women.  You have a fever.  You make less pee than normal.  You have pain or you bleed when you pee or poop. These symptoms may be an emergency. Get help right away. Call your local emergency services (911 in the [place]  Do not wait to see if the symptoms will go away.  Do not drive yourself to the hospital. Summary  Chronic kidney disease is when lasting damage happens to the kidneys slowly over a long time.  Causes of this disease include diabetes and high blood pressure.  Often, there is no cure for this disease. Treatment can help symptoms and help keep the disease from getting worse.  Treatment may involve lifestyle changes, medicines, and dialysis. This information is not intended to replace advice given to you by your health care provider. Make sure you discuss any questions you have with your health care provider. Document Revised: 02/12/2020 Document Reviewed: 02/12/2020 [employer] Patient Education  2021 [employer]    If you have lab work done today you will be contacted with your lab results within the next 2 weeks.  If you have not heard from us then please contact us. The fastest way to get your results is to register for My Chart.   IF you received an x-ray today, you will receive an invoice from [facility]. Please contact  Radiology at [phone] with questions or concerns regarding your invoice.   IF you received labwork today, you will receive an invoice from [facility]. Please contact [facility] at [phone] with questions or concerns regarding your invoice.   Our billing staff will not be able to assist you with questions regarding bills from these companies.  You will be contacted with the lab results as soon as they are available. The fastest way to get your results is to activate your My Chart account. Instructions are located on the last page of this paperwork. If you have not heard from us regarding the results in 2 weeks, please  contact this office.      

## 2020-12-14 DIAGNOSIS — I129 Hypertensive chronic kidney disease with stage 1 through stage 4 chronic kidney disease, or unspecified chronic kidney disease: Secondary | ICD-10-CM | POA: Diagnosis not present

## 2020-12-14 DIAGNOSIS — I639 Cerebral infarction, unspecified: Secondary | ICD-10-CM | POA: Diagnosis not present

## 2020-12-14 DIAGNOSIS — E1122 Type 2 diabetes mellitus with diabetic chronic kidney disease: Secondary | ICD-10-CM | POA: Diagnosis not present

## 2020-12-14 DIAGNOSIS — N1832 Chronic kidney disease, stage 3b: Secondary | ICD-10-CM | POA: Diagnosis not present

## 2020-12-14 DIAGNOSIS — Z720 Tobacco use: Secondary | ICD-10-CM | POA: Diagnosis not present

## 2020-12-14 DIAGNOSIS — I739 Peripheral vascular disease, unspecified: Secondary | ICD-10-CM | POA: Diagnosis not present

## 2020-12-14 DIAGNOSIS — R011 Cardiac murmur, unspecified: Secondary | ICD-10-CM | POA: Diagnosis not present

## 2020-12-14 DIAGNOSIS — I5030 Unspecified diastolic (congestive) heart failure: Secondary | ICD-10-CM | POA: Diagnosis not present

## 2021-01-03 DIAGNOSIS — I10 Essential (primary) hypertension: Secondary | ICD-10-CM | POA: Diagnosis not present

## 2021-01-03 DIAGNOSIS — I509 Heart failure, unspecified: Secondary | ICD-10-CM | POA: Diagnosis not present

## 2021-01-03 DIAGNOSIS — I639 Cerebral infarction, unspecified: Secondary | ICD-10-CM | POA: Diagnosis not present

## 2021-01-31 DIAGNOSIS — I509 Heart failure, unspecified: Secondary | ICD-10-CM | POA: Diagnosis not present

## 2021-01-31 DIAGNOSIS — I639 Cerebral infarction, unspecified: Secondary | ICD-10-CM | POA: Diagnosis not present

## 2021-01-31 DIAGNOSIS — I10 Essential (primary) hypertension: Secondary | ICD-10-CM | POA: Diagnosis not present

## 2021-03-03 DIAGNOSIS — I509 Heart failure, unspecified: Secondary | ICD-10-CM | POA: Diagnosis not present

## 2021-03-03 DIAGNOSIS — I639 Cerebral infarction, unspecified: Secondary | ICD-10-CM | POA: Diagnosis not present

## 2021-03-03 DIAGNOSIS — I10 Essential (primary) hypertension: Secondary | ICD-10-CM | POA: Diagnosis not present

## 2021-03-15 DIAGNOSIS — I739 Peripheral vascular disease, unspecified: Secondary | ICD-10-CM | POA: Diagnosis not present

## 2021-03-15 DIAGNOSIS — R011 Cardiac murmur, unspecified: Secondary | ICD-10-CM | POA: Diagnosis not present

## 2021-03-15 DIAGNOSIS — N1832 Chronic kidney disease, stage 3b: Secondary | ICD-10-CM | POA: Diagnosis not present

## 2021-03-15 DIAGNOSIS — I129 Hypertensive chronic kidney disease with stage 1 through stage 4 chronic kidney disease, or unspecified chronic kidney disease: Secondary | ICD-10-CM | POA: Diagnosis not present

## 2021-03-15 DIAGNOSIS — Z6841 Body Mass Index (BMI) 40.0 and over, adult: Secondary | ICD-10-CM | POA: Diagnosis not present

## 2021-03-15 DIAGNOSIS — I639 Cerebral infarction, unspecified: Secondary | ICD-10-CM | POA: Diagnosis not present

## 2021-03-15 DIAGNOSIS — E1122 Type 2 diabetes mellitus with diabetic chronic kidney disease: Secondary | ICD-10-CM | POA: Diagnosis not present

## 2021-03-30 DIAGNOSIS — N1832 Chronic kidney disease, stage 3b: Secondary | ICD-10-CM | POA: Diagnosis not present

## 2021-04-02 DIAGNOSIS — I509 Heart failure, unspecified: Secondary | ICD-10-CM | POA: Diagnosis not present

## 2021-04-02 DIAGNOSIS — I10 Essential (primary) hypertension: Secondary | ICD-10-CM | POA: Diagnosis not present

## 2021-04-02 DIAGNOSIS — I639 Cerebral infarction, unspecified: Secondary | ICD-10-CM | POA: Diagnosis not present

## 2021-05-03 DIAGNOSIS — I10 Essential (primary) hypertension: Secondary | ICD-10-CM | POA: Diagnosis not present

## 2021-05-03 DIAGNOSIS — I639 Cerebral infarction, unspecified: Secondary | ICD-10-CM | POA: Diagnosis not present

## 2021-05-03 DIAGNOSIS — I509 Heart failure, unspecified: Secondary | ICD-10-CM | POA: Diagnosis not present

## 2021-06-02 DIAGNOSIS — I10 Essential (primary) hypertension: Secondary | ICD-10-CM | POA: Diagnosis not present

## 2021-06-02 DIAGNOSIS — I509 Heart failure, unspecified: Secondary | ICD-10-CM | POA: Diagnosis not present

## 2021-06-02 DIAGNOSIS — I639 Cerebral infarction, unspecified: Secondary | ICD-10-CM | POA: Diagnosis not present

## 2021-07-03 DIAGNOSIS — I10 Essential (primary) hypertension: Secondary | ICD-10-CM | POA: Diagnosis not present

## 2021-07-03 DIAGNOSIS — I509 Heart failure, unspecified: Secondary | ICD-10-CM | POA: Diagnosis not present

## 2021-07-03 DIAGNOSIS — I639 Cerebral infarction, unspecified: Secondary | ICD-10-CM | POA: Diagnosis not present

## 2021-07-17 ENCOUNTER — Encounter (HOSPITAL_COMMUNITY): Payer: Self-pay | Admitting: Emergency Medicine

## 2021-07-17 ENCOUNTER — Emergency Department (HOSPITAL_COMMUNITY): Payer: Medicare HMO

## 2021-07-17 ENCOUNTER — Other Ambulatory Visit: Payer: Self-pay

## 2021-07-17 ENCOUNTER — Emergency Department (HOSPITAL_BASED_OUTPATIENT_CLINIC_OR_DEPARTMENT_OTHER): Payer: Medicare HMO

## 2021-07-17 ENCOUNTER — Observation Stay (HOSPITAL_COMMUNITY)
Admission: EM | Admit: 2021-07-17 | Discharge: 2021-07-19 | Disposition: A | Discharge status: Home / Self Care | Payer: Medicare HMO | Attending: Internal Medicine | Admitting: Internal Medicine

## 2021-07-17 DIAGNOSIS — N1832 Chronic kidney disease, stage 3b: Secondary | ICD-10-CM | POA: Insufficient documentation

## 2021-07-17 DIAGNOSIS — I10 Essential (primary) hypertension: Secondary | ICD-10-CM | POA: Diagnosis not present

## 2021-07-17 DIAGNOSIS — R519 Headache, unspecified: Secondary | ICD-10-CM | POA: Insufficient documentation

## 2021-07-17 DIAGNOSIS — I130 Hypertensive heart and chronic kidney disease with heart failure and stage 1 through stage 4 chronic kidney disease, or unspecified chronic kidney disease: Secondary | ICD-10-CM | POA: Insufficient documentation

## 2021-07-17 DIAGNOSIS — I110 Hypertensive heart disease with heart failure: Secondary | ICD-10-CM | POA: Diagnosis present

## 2021-07-17 DIAGNOSIS — I5033 Acute on chronic diastolic (congestive) heart failure: Secondary | ICD-10-CM

## 2021-07-17 DIAGNOSIS — Z79899 Other long term (current) drug therapy: Secondary | ICD-10-CM | POA: Diagnosis not present

## 2021-07-17 DIAGNOSIS — R609 Edema, unspecified: Secondary | ICD-10-CM

## 2021-07-17 DIAGNOSIS — R531 Weakness: Secondary | ICD-10-CM | POA: Diagnosis not present

## 2021-07-17 DIAGNOSIS — I160 Hypertensive urgency: Principal | ICD-10-CM | POA: Diagnosis present

## 2021-07-17 DIAGNOSIS — I5030 Unspecified diastolic (congestive) heart failure: Secondary | ICD-10-CM

## 2021-07-17 DIAGNOSIS — R0602 Shortness of breath: Secondary | ICD-10-CM | POA: Diagnosis not present

## 2021-07-17 DIAGNOSIS — Z20822 Contact with and (suspected) exposure to covid-19: Secondary | ICD-10-CM | POA: Diagnosis not present

## 2021-07-17 DIAGNOSIS — Z7982 Long term (current) use of aspirin: Secondary | ICD-10-CM | POA: Insufficient documentation

## 2021-07-17 DIAGNOSIS — Z91040 Latex allergy status: Secondary | ICD-10-CM | POA: Insufficient documentation

## 2021-07-17 DIAGNOSIS — F17210 Nicotine dependence, cigarettes, uncomplicated: Secondary | ICD-10-CM | POA: Diagnosis not present

## 2021-07-17 DIAGNOSIS — E1122 Type 2 diabetes mellitus with diabetic chronic kidney disease: Secondary | ICD-10-CM | POA: Insufficient documentation

## 2021-07-17 DIAGNOSIS — I509 Heart failure, unspecified: Secondary | ICD-10-CM

## 2021-07-17 DIAGNOSIS — R0902 Hypoxemia: Secondary | ICD-10-CM | POA: Diagnosis not present

## 2021-07-17 HISTORY — DX: Chronic kidney disease, stage 3 unspecified: N18.30

## 2021-07-17 LAB — COMPREHENSIVE METABOLIC PANEL
ALT: 12 U/L (ref 0–44)
AST: 12 U/L — ABNORMAL LOW (ref 15–41)
Albumin: 3.1 g/dL — ABNORMAL LOW (ref 3.5–5.0)
Alkaline Phosphatase: 80 U/L (ref 38–126)
Anion gap: 7 (ref 5–15)
BUN: 31 mg/dL — ABNORMAL HIGH (ref 8–23)
CO2: 30 mmol/L (ref 22–32)
Calcium: 8.8 mg/dL — ABNORMAL LOW (ref 8.9–10.3)
Chloride: 105 mmol/L (ref 98–111)
Creatinine, Ser: 1.82 mg/dL — ABNORMAL HIGH (ref 0.44–1.00)
GFR, Estimated: 30 mL/min — ABNORMAL LOW (ref >60)
Glucose, Bld: 168 mg/dL — ABNORMAL HIGH (ref 70–99)
Potassium: 3.5 mmol/L (ref 3.5–5.1)
Sodium: 142 mmol/L (ref 135–145)
Total Bilirubin: 0.7 mg/dL (ref 0.3–1.2)
Total Protein: 6.6 g/dL (ref 6.5–8.1)

## 2021-07-17 LAB — TROPONIN I (HIGH SENSITIVITY)
Troponin I (High Sensitivity): 33 ng/L — ABNORMAL HIGH (ref <18)
Troponin I (High Sensitivity): 35 ng/L — ABNORMAL HIGH (ref <18)

## 2021-07-17 LAB — CBC WITH DIFFERENTIAL/PLATELET
Abs Immature Granulocytes: 0.01 10*3/uL (ref 0.00–0.07)
Basophils Absolute: 0 10*3/uL (ref 0.0–0.1)
Basophils Relative: 1 %
Eosinophils Absolute: 0.1 10*3/uL (ref 0.0–0.5)
Eosinophils Relative: 3 %
HCT: 38.7 % (ref 36.0–46.0)
Hemoglobin: 12.5 g/dL (ref 12.0–15.0)
Immature Granulocytes: 0 %
Lymphocytes Relative: 24 %
Lymphs Abs: 1.4 10*3/uL (ref 0.7–4.0)
MCH: 30.4 pg (ref 26.0–34.0)
MCHC: 32.3 g/dL (ref 30.0–36.0)
MCV: 94.2 fL (ref 80.0–100.0)
Monocytes Absolute: 0.5 10*3/uL (ref 0.1–1.0)
Monocytes Relative: 9 %
Neutro Abs: 3.6 10*3/uL (ref 1.7–7.7)
Neutrophils Relative %: 63 %
Platelets: 252 10*3/uL (ref 150–400)
RBC: 4.11 MIL/uL (ref 3.87–5.11)
RDW: 14.2 % (ref 11.5–15.5)
WBC: 5.7 10*3/uL (ref 4.0–10.5)
nRBC: 0 % (ref 0.0–0.2)

## 2021-07-17 LAB — URINALYSIS, ROUTINE W REFLEX MICROSCOPIC
Bilirubin Urine: NEGATIVE
Glucose, UA: NEGATIVE mg/dL
Ketones, ur: NEGATIVE mg/dL
Leukocytes,Ua: NEGATIVE
Nitrite: NEGATIVE
Protein, ur: 100 mg/dL — AB
Specific Gravity, Urine: 1.016 (ref 1.005–1.030)
pH: 6 (ref 5.0–8.0)

## 2021-07-17 LAB — BRAIN NATRIURETIC PEPTIDE: B Natriuretic Peptide: 1006.3 pg/mL — ABNORMAL HIGH (ref 0.0–100.0)

## 2021-07-17 LAB — RESP PANEL BY RT-PCR (FLU A&B, COVID) ARPGX2
Influenza A by PCR: NEGATIVE
Influenza B by PCR: NEGATIVE
SARS Coronavirus 2 by RT PCR: NEGATIVE

## 2021-07-17 LAB — CBG MONITORING, ED: Glucose-Capillary: 156 mg/dL — ABNORMAL HIGH (ref 70–99)

## 2021-07-17 MED ORDER — MORPHINE SULFATE (PF) 2 MG/ML IV SOLN
2.0000 mg | Freq: Once | INTRAVENOUS | Status: AC
  Administered 2021-07-17: 2 mg via INTRAVENOUS
  Filled 2021-07-17: qty 1

## 2021-07-17 MED ORDER — ASPIRIN EC 81 MG PO TBEC
81.0000 mg | DELAYED_RELEASE_TABLET | Freq: Every day | ORAL | Status: DC
  Administered 2021-07-18 – 2021-07-19 (×3): 81 mg via ORAL
  Filled 2021-07-17 (×3): qty 1

## 2021-07-17 MED ORDER — NITROGLYCERIN IN D5W 200-5 MCG/ML-% IV SOLN
0.0000 ug/min | INTRAVENOUS | Status: DC
  Administered 2021-07-17: 25 ug/min via INTRAVENOUS
  Administered 2021-07-18: 95 ug/min via INTRAVENOUS
  Filled 2021-07-17 (×2): qty 250

## 2021-07-17 MED ORDER — HYDRALAZINE HCL 20 MG/ML IJ SOLN
10.0000 mg | Freq: Once | INTRAMUSCULAR | Status: AC
  Administered 2021-07-17: 10 mg via INTRAVENOUS
  Filled 2021-07-17: qty 1

## 2021-07-17 MED ORDER — FUROSEMIDE 10 MG/ML IJ SOLN
40.0000 mg | Freq: Two times a day (BID) | INTRAMUSCULAR | Status: DC
  Administered 2021-07-18 – 2021-07-19 (×3): 40 mg via INTRAVENOUS
  Filled 2021-07-17 (×3): qty 4

## 2021-07-17 MED ORDER — METOPROLOL TARTRATE 5 MG/5ML IV SOLN
10.0000 mg | Freq: Once | INTRAVENOUS | Status: AC
  Administered 2021-07-17: 10 mg via INTRAVENOUS
  Filled 2021-07-17: qty 10

## 2021-07-17 MED ORDER — ENOXAPARIN SODIUM 30 MG/0.3ML IJ SOSY
30.0000 mg | PREFILLED_SYRINGE | INTRAMUSCULAR | Status: DC
  Administered 2021-07-18 (×2): 30 mg via SUBCUTANEOUS
  Filled 2021-07-17 (×2): qty 0.3

## 2021-07-17 MED ORDER — ACETAMINOPHEN 325 MG PO TABS
650.0000 mg | ORAL_TABLET | Freq: Four times a day (QID) | ORAL | Status: DC | PRN
  Administered 2021-07-18 – 2021-07-19 (×3): 650 mg via ORAL
  Filled 2021-07-17 (×3): qty 2

## 2021-07-17 MED ORDER — INSULIN ASPART 100 UNIT/ML IJ SOLN
0.0000 [IU] | Freq: Three times a day (TID) | INTRAMUSCULAR | Status: DC
  Administered 2021-07-18 (×2): 5 [IU] via SUBCUTANEOUS
  Administered 2021-07-18 – 2021-07-19 (×2): 3 [IU] via SUBCUTANEOUS
  Administered 2021-07-19: 5 [IU] via SUBCUTANEOUS

## 2021-07-17 MED ORDER — ACETAMINOPHEN 650 MG RE SUPP: 650.0000 mg | Freq: Four times a day (QID) | RECTAL | Status: DC | PRN

## 2021-07-17 MED ORDER — FUROSEMIDE 10 MG/ML IJ SOLN
40.0000 mg | Freq: Once | INTRAMUSCULAR | Status: AC
  Administered 2021-07-17: 40 mg via INTRAVENOUS
  Filled 2021-07-17: qty 4

## 2021-07-17 MED ORDER — ROSUVASTATIN CALCIUM 5 MG PO TABS
10.0000 mg | ORAL_TABLET | Freq: Every day | ORAL | Status: DC
  Administered 2021-07-18 – 2021-07-19 (×3): 10 mg via ORAL
  Filled 2021-07-17 (×4): qty 2

## 2021-07-17 NOTE — ED Provider Notes (Signed)
[facility] EMERGENCY DEPARTMENT Provider Note   CSN: [id number] Arrival date & time: 07/17/21  1447     History Chief Complaint  Patient presents with   Shortness of Breath    Alicia [name] is a 70 y.o. female with history of CVA, hyperlipidemia, diabetes, hypertension, and CHF who presents with concern for progressively worsening shortness of breath and dyspnea on exertion for the last couple of weeks.  States has been out of multiple medications but she is not sure which ones.  States has not taken any medications in the last couple of days.  Denies any chest pain but does endorse some headache.  Denies any palpitations but endorses lower extremity edema.  I personally reads patient medical records.  She is not on any anticoagulation.  HPI     Past Medical History:  Diagnosis Date   Allergy    Arthritis    Chronic headaches    Constipation    Depression    Diabetes mellitus    Hyperlipidemia    Hypertension    Obesity    PAD (peripheral artery disease) (HCC)    Shortness of breath    Stage 3 chronic kidney disease ([place])    Stroke [facility])     Patient Active Problem List   Diagnosis Date Noted   Exacerbation of RAD (reactive airway disease) 02/27/2020   Hypertensive urgency 02/27/2020   CKD (chronic kidney disease) stage 3, GFR 30-59 ml/min ([place]) 02/27/2020   Tobacco use 02/27/2020   Hypertensive emergency 02/27/2020   Prolonged Q-T interval on ECG 04/27/2018   CHF (congestive heart failure) ([place]) 04/27/2018   Weakness 04/03/2018   Syncope 04/03/2018   Renal insufficiency 03/16/2018   Diabetes mellitus type 2 in obese ([place]) 03/16/2018   Morbid obesity ([place])    CVA (cerebral vascular accident) ([place]) 03/15/2018   Numbness in both hands- and Feet 06/06/2014   Weakness of both legs 06/06/2014   Swelling of limb-Left Leg > Right 06/06/2014   Peripheral vascular disease, unspecified ([place]) 11/30/2012   Pain in limb 11/30/2012    Atherosclerosis of native arteries of the extremities with intermittent claudication 04/06/2012   CHEST PAIN 01/03/2011   Hyperlipidemia 07/20/2007   Essential hypertension, benign 07/20/2007   Arthropathy 07/20/2007    Past Surgical History:  Procedure Laterality Date   ABDOMINAL AORTOGRAM W/LOWER EXTREMITY N/A 12/17/2018   Procedure: ABDOMINAL AORTOGRAM W/LOWER EXTREMITY;  Surgeon: Waynetta Sandy, MD;  Location: [place] CV LAB;  Service: Cardiovascular;  Laterality: N/A;   FEMORAL BYPASS  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 Bridgett Larsson   PERIPHERAL VASCULAR ATHERECTOMY Left 12/17/2018   Procedure: PERIPHERAL VASCULAR ATHERECTOMY;  Surgeon: Waynetta Sandy, MD;  Location: [place] CV LAB;  Service: Cardiovascular;  Laterality: Left;   PERIPHERAL VASCULAR BALLOON ANGIOPLASTY Left 12/17/2018   Procedure: PERIPHERAL VASCULAR BALLOON ANGIOPLASTY;  Surgeon: Waynetta Sandy, MD;  Location: [place] CV LAB;  Service: Cardiovascular;  Laterality: Left;     OB History   No obstetric history on file.     Family History  Problem Relation Age of Onset   Diabetes Father    Heart disease Mother        NOT before age 27-  Bypass   Hypertension Mother    Hypertension Sister    Varicose Veins Brother    Heart disease Brother        Before age 39   Hypertension Daughter  Colon cancer Neg Hx    Esophageal cancer Neg Hx    Rectal cancer Neg Hx    Stomach cancer Neg Hx     Social History   Tobacco Use   Smoking status: Light Smoker    Packs/day: 0.50    Years: 35.00    Pack years: 17.50    Types: Cigarettes   Smokeless tobacco: Never   Tobacco comments:    patient is trying to quit and has gone down to half a pack per day  Vaping Use   Vaping Use: Never used  Substance Use Topics   Alcohol use: Yes    Alcohol/week: 1.0 standard drink    Types: 1 Standard drinks or equivalent per week    Comment: occasional use  only   Drug use: No    Home Medications Prior to Admission medications   Medication Sig Start Date End Date Taking? Authorizing Provider  acetaminophen (TYLENOL) 500 MG tablet Take 1,000 mg by mouth every 6 (six) hours as needed for moderate pain or headache.     [provider]  aspirin 81 MG EC tablet Take 1 tablet (81 mg total) by mouth daily. 04/06/18   Daleen Squibb, MD  blood glucose meter kit and supplies per insurance preference. Check cbg once a day. Dx E11.9 01/06/20   Jacelyn Pi, Lilia Argue, MD  chlorthalidone (HYGROTON) 25 MG tablet  10/13/20   [provider]  fluticasone (FLONASE) 50 MCG/ACT nasal spray Place 1 spray into both nostrils 2 (two) times daily as needed for allergies or rhinitis. 03/20/20   Daleen Squibb, MD  furosemide (LASIX) 40 MG tablet Take 1 tablet (40 mg total) by mouth daily. 06/15/20   Jacelyn Pi, Lilia Argue, MD  irbesartan (AVAPRO) 75 MG tablet Take 75 mg by mouth daily. Take one daily    [provider]  metoprolol succinate (TOPROL XL) 50 MG 24 hr tablet Take 1 tablet (50 mg total) by mouth daily. Take with or immediately following a meal. 06/15/20   Jacelyn Pi, Lilia Argue, MD  NIFEdipine (ADALAT CC) 60 MG 24 hr tablet Take 1 tablet (60 mg total) by mouth daily. 06/15/20   Jacelyn Pi, Lilia Argue, MD  NIFEdipine (PROCARDIA XL/NIFEDICAL XL) 60 MG 24 hr tablet  09/13/20   [provider]  OXYGEN Inhale into the lungs.    [provider]  polyethylene glycol powder (GLYCOLAX/MIRALAX) 17 GM/SCOOP powder Take 17 g by mouth 2 (two) times daily as needed. 06/15/20   Daleen Squibb, MD  rosuvastatin (CRESTOR) 10 MG tablet Take 1 tablet (10 mg total) by mouth daily. 06/15/20   Daleen Squibb, MD    Allergies    Atorvastatin, Nifedipine er, and Latex  Review of Systems   Review of Systems  Constitutional:  Positive for activity change. Negative for appetite change, chills, diaphoresis, fatigue and  fever.  HENT: Negative.    Respiratory:  Positive for shortness of breath. Negative for cough and chest tightness.        DOE  Cardiovascular:  Positive for leg swelling. Negative for chest pain and palpitations.  Gastrointestinal: Negative.   Genitourinary: Negative.   Musculoskeletal: Negative.   Skin: Negative.   Neurological: Negative.    Physical Exam Updated Vital Signs BP (!) 212/92   Pulse 78   Temp 98.2 F (36.8 C) (Oral)   Resp (!) 26   Ht 5' 7"  (1.702 m)   Wt 114.8 kg  SpO2 91%   BMI 39.63 kg/m   Physical Exam Vitals and nursing note reviewed.  Constitutional:      Appearance: She is obese. She is not toxic-appearing.  HENT:     Head: Normocephalic and atraumatic.     Nose: Nose normal.     Mouth/Throat:     Mouth: Mucous membranes are moist.     Pharynx: Oropharynx is clear. Uvula midline. No oropharyngeal exudate or posterior oropharyngeal erythema.     Tonsils: No tonsillar exudate.  Eyes:     General: Lids are normal. Vision grossly intact.        Right eye: No discharge.        Left eye: No discharge.     Extraocular Movements: Extraocular movements intact.     Conjunctiva/sclera: Conjunctivae normal.     Pupils: Pupils are equal, round, and reactive to light.  Neck:     Trachea: Trachea and phonation normal.  Cardiovascular:     Rate and Rhythm: Normal rate and regular rhythm.     Pulses: Normal pulses.          Dorsalis pedis pulses are 2+ on the right side and 2+ on the left side.     Heart sounds: Normal heart sounds. No murmur heard.    Comments: Mild left calf tenderness to palpation without erythema or warmth to the touch.  Pulmonary:     Effort: Pulmonary effort is normal. Tachypnea present. No accessory muscle usage, prolonged expiration or respiratory distress.     Breath sounds: Decreased air movement present. Examination of the right-lower field reveals decreased breath sounds. Examination of the left-lower field reveals decreased  breath sounds. Decreased breath sounds present. No wheezing or rales.     Comments: Exam complicated by patient's body habitus.  Dyspneic with prolonged conversation Chest:     Chest wall: No mass, lacerations, deformity, swelling, tenderness, crepitus or edema.  Abdominal:     General: Bowel sounds are normal. There is no distension.     Palpations: Abdomen is soft. There is fluid wave.     Tenderness: There is no abdominal tenderness. There is no right CVA tenderness or left CVA tenderness.  Musculoskeletal:        General: No deformity.     Cervical back: Normal range of motion and neck supple. No edema, rigidity or crepitus. No pain with movement, spinous process tenderness or muscular tenderness.     Right lower leg: 2+ Pitting Edema present.     Left lower leg: No edema.  Lymphadenopathy:     Cervical: No cervical adenopathy.  Skin:    General: Skin is warm and dry.     Capillary Refill: Capillary refill takes less than 2 seconds.  Neurological:     General: No focal deficit present.     Mental Status: She is alert and oriented to person, place, and time. Mental status is at baseline.     Gait: Gait is intact.  Psychiatric:        Mood and Affect: Mood normal.    ED Results / Procedures / Treatments   Labs (all labs ordered are listed, but only abnormal results are displayed) Labs Reviewed  URINALYSIS, ROUTINE W REFLEX MICROSCOPIC - Abnormal; Notable for the following components:      Result Value   Hgb urine dipstick SMALL (*)    Protein, ur 100 (*)    Bacteria, UA RARE (*)    All other components within normal limits  COMPREHENSIVE METABOLIC  PANEL - Abnormal; Notable for the following components:   Glucose, Bld 168 (*)    BUN 31 (*)    Creatinine, Ser 1.82 (*)    Calcium 8.8 (*)    Albumin 3.1 (*)    AST 12 (*)    GFR, Estimated 30 (*)    All other components within normal limits  BRAIN NATRIURETIC PEPTIDE - Abnormal; Notable for the following components:   B  Natriuretic Peptide 1,006.3 (*)    All other components within normal limits  CBG MONITORING, ED - Abnormal; Notable for the following components:   Glucose-Capillary 156 (*)    All other components within normal limits  TROPONIN I (HIGH SENSITIVITY) - Abnormal; Notable for the following components:   Troponin I (High Sensitivity) 33 (*)    All other components within normal limits  TROPONIN I (HIGH SENSITIVITY) - Abnormal; Notable for the following components:   Troponin I (High Sensitivity) 35 (*)    All other components within normal limits  RESP PANEL BY RT-PCR (FLU A&B, COVID) ARPGX2  CBC WITH DIFFERENTIAL/PLATELET  HIV ANTIBODY (ROUTINE TESTING W REFLEX)  HEMOGLOBIN A1C  CBC WITH DIFFERENTIAL/PLATELET  COMPREHENSIVE METABOLIC PANEL  MAGNESIUM    EKG EKG Interpretation  Date/Time:  Saturday July 17 2021 14:57:59 EDT Ventricular Rate:  75 PR Interval:  154 QRS Duration: 107 QT Interval:  453 QTC Calculation: 506 R Axis:   -10 Text Interpretation: Sinus rhythm Biatrial enlargement Abnormal R-wave progression, early transition LVH with secondary repolarization abnormality Prolonged QT interval less ectopy than prior 4/21 Confirmed by Aletta Edouard [phone]) on 07/17/2021 3:15:36 PM  Radiology DG Chest 2 View  Result Date: 07/17/2021 CLINICAL DATA:  sob EXAM: CHEST - 2 VIEW COMPARISON:  March 13, 2020 FINDINGS: The cardiomediastinal silhouette is unchanged and enlarged in contour.Trace fluid in the RIGHT minor fissure. No pleural effusion. No pneumothorax. Perihilar vascular fullness. No acute pleuroparenchymal abnormality. Visualized abdomen is unremarkable. Advanced degenerative changes of the RIGHT shoulder. IMPRESSION: Perihilar vascular fullness without overt edema. Electronically Signed   By: Valentino Saxon M.D.   On: 07/17/2021 17:33   CT Head Wo Contrast  Result Date: 07/17/2021 CLINICAL DATA:  Headache.  Hypertension. EXAM: CT HEAD WITHOUT CONTRAST TECHNIQUE:  Contiguous axial images were obtained from the base of the skull through the vertex without intravenous contrast. COMPARISON:  Mar 23, 2018. FINDINGS: Brain: Mild chronic ischemic white matter disease is noted. Old right cerebellar infarction is noted. No mass effect or midline shift is noted. Ventricular size is within normal limits. There is no evidence of mass lesion, hemorrhage or acute infarction. Vascular: No hyperdense vessel or unexpected calcification. Skull: Normal. Negative for fracture or focal lesion. Sinuses/Orbits: No acute finding. Other: None. IMPRESSION: No acute intracranial abnormality seen. Electronically Signed   By: Marijo Conception M.D.   On: 07/17/2021 16:26   VAS [place] LOWER EXTREMITY VENOUS (DVT) ([facility] and [facility] 7a-7p)  Result Date: 07/17/2021  Lower Venous DVT Study [name]  Date of Exam:   07/17/2021 Medical Rec #: [id number]             Accession #:    [id number] Date of Birth: 1951/07/10             Patient Gender: F Patient Age:   30 years Exam [facility]  [facility] Procedure:      VAS [place] LOWER EXTREMITY VENOUS (DVT) Referring Phys: [facility] Adisen Bennion --------------------------------------------------------------------------------  Indications: Edema.  Risk Factors: CHF. Limitations:  Body habitus and poor ultrasound/tissue interface. Comparison Study: Prior negative left LEV done 02/28/20 Performing Technologist: Sharion Dove RVS  Examination Guidelines: A complete evaluation includes B-mode imaging, spectral Doppler, color Doppler, and power Doppler as needed of all accessible portions of each vessel. Bilateral testing is considered an integral part of a complete examination. Limited examinations for reoccurring indications may be performed as noted. The reflux portion of the exam is performed with the patient in reverse Trendelenburg.  +-----+---------------+---------+-----------+----------+--------------+  RIGHTCompressibilityPhasicitySpontaneityPropertiesThrombus Aging +-----+---------------+---------+-----------+----------+--------------+ CFV  Full           Yes      Yes                                 +-----+---------------+---------+-----------+----------+--------------+   +---------+---------------+---------+-----------+----------+-------------------+ LEFT     CompressibilityPhasicitySpontaneityPropertiesThrombus Aging      +---------+---------------+---------+-----------+----------+-------------------+ CFV      Full           Yes      Yes                                      +---------+---------------+---------+-----------+----------+-------------------+ SFJ      Full                                                             +---------+---------------+---------+-----------+----------+-------------------+ FV Prox  Full                                                             +---------+---------------+---------+-----------+----------+-------------------+ FV Mid   Full                                                             +---------+---------------+---------+-----------+----------+-------------------+ FV DistalFull                                                             +---------+---------------+---------+-----------+----------+-------------------+ PFV                                                   Not well visualized +---------+---------------+---------+-----------+----------+-------------------+ POP                                                   patent by color and  Doppler             +---------+---------------+---------+-----------+----------+-------------------+ PTV      Full                                                             +---------+---------------+---------+-----------+----------+-------------------+ PERO     Full                                                              +---------+---------------+---------+-----------+----------+-------------------+    Summary: RIGHT: - No evidence of common femoral vein obstruction.  LEFT: - There is no evidence of deep vein thrombosis in the lower extremity. However, portions of this examination were limited- see technologist comments above.  *See table(s) above for measurements and observations.    Preliminary     Procedures Procedures   Medications Ordered in ED Medications  nitroGLYCERIN 50 mg in dextrose 5 % 250 mL (0.2 mg/mL) infusion (25 mcg/min Intravenous New Bag/Given 07/17/21 1949)  enoxaparin (LOVENOX) injection 30 mg (has no administration in time range)  acetaminophen (TYLENOL) tablet 650 mg (has no administration in time range)    Or  acetaminophen (TYLENOL) suppository 650 mg (has no administration in time range)  aspirin EC tablet 81 mg (has no administration in time range)  rosuvastatin (CRESTOR) tablet 10 mg (has no administration in time range)  insulin aspart (novoLOG) injection 0-15 Units (has no administration in time range)  furosemide (LASIX) injection 40 mg (has no administration in time range)  metoprolol tartrate (LOPRESSOR) injection 10 mg (10 mg Intravenous Given 07/17/21 1628)  furosemide (LASIX) injection 40 mg (40 mg Intravenous Given 07/17/21 1821)  hydrALAZINE (APRESOLINE) injection 10 mg (10 mg Intravenous Given 07/17/21 1821)  morphine 2 MG/ML injection 2 mg (2 mg Intravenous Given 07/17/21 1849)    ED Course  I have reviewed the triage vital signs and the nursing notes.  Pertinent labs & imaging results that were available during my care of the patient were reviewed by me and considered in my medical decision making (see chart for details).  Clinical Course as of 07/18/21 0002  Sat Jul 17, 2021  1743 Discussion with pharmacist regarding role of IV nitroglycerin for management of blood pressure; given patient is not acutely decompensated and is  not requiring any oxygen and is not experiencing any chest pain, feel it is reasonable to trial hydralazine.  We will not proceed with any further beta-blockers given heart rate remains in the 60s at this time.  I appreciate his collaboration care of this patient.  We will discuss management of blood pressure with admitting physician. [RS]  4782 Per vascular's ultrasonography technician, patient negative for DVT. [RS]  1915 She is here with a complaint of increased shortness of breath dyspnea on exertion elevated blood pressures.  Noncompliant with medication.  BNP elevated chest x-ray showing some element of fluid overload.  Will need admission to the hospital for further management of her symptoms including her hypertensive urgency. [MB]  1920 Consult to internal medicine, Dr. Johnney Ou, who is agreeable to seeing this patient admitting her to his service.  Per his  request, will order nitro drip for hypertensive urgency. [RS]    Clinical Course User Index [MB] Hayden Rasmussen, MD [RS] Xela Oregel, Sharlene Dory   MDM Rules/Calculators/A&P                         70 year old female with history of CHF, hypertension, and diabetes who presents with concern for recently worsening shortness of breath for the last few weeks, has been on multiple medications, dyspneic on exertion.  Differential diagnosis includes is limited to acute heart failure exacerbation, ACS, dysrhythmia, PE, pleural effusion, pneumonia, hypertensive emergency or urgency.  Severely hypertensive on intake to 242/104.  Vitals otherwise normal.  Cardiopulmonary exam with diminished breath sounds throughout but without rales.  Abdominal exam with distended abdomen, positive fluid wave.  Left lower extremity 2+ pitting edema with 2+ pedal pulses.  Mild calf tenderness on the left but no redness, warmth to touch, or exquisite tenderness to palpation.  Patient continues to saturate normally on room air and is only dyspneic when she  has prolonged conversation.  CBC unremarkable, CMP with  creatinine near her baseline with creatinine of 1.8.  UA not concerning for infection.  Troponin mildly elevated to 33, however near her baseline of 26.  BNP significantly elevated to 1006, baseline near 130.  Chest x-ray with some pulmonary vascular congestion but without overt edema.  CT of the head obtained given intermittent headaches in context of hypertension, negative for acute bleed or other intracranial abnormality.  EKG without ischemic changes, she does have a prolonged QRS interval.  Patient ministered IV metoprolol without improvement in her blood pressure, tolerated well, did not become bradycardic.  Will trial hydralazine, will hold off on nitro drip given patient is clinically stable at this time.  Do feel she would benefit from mission to the hospital for heart failure exacerbation.  Pending consult call.  Consult to hospitalist as above, patient has been accepted to internal medicine service.  No further work-up warranted in the emergency department at this time.  Keierra voiced understanding of her medical evaluation and treatment plan.  Each of her questions was answered to her expressed satisfaction.  She is amenable to plan for admission at this time.  This chart was dictated using voice recognition software, Dragon. Despite the best efforts of this provider to proofread and correct errors, errors may still occur which can change documentation meaning.  Final Clinical Impression(s) / ED Diagnoses Final diagnoses:  Acute on chronic congestive heart failure, unspecified heart failure type [facility])    Rx / DC Orders ED Discharge Orders     None        Aura Dials 07/18/21 0002    Hayden Rasmussen, MD 07/18/21 1105

## 2021-07-17 NOTE — ED Triage Notes (Signed)
Pt arrives via EMS for c/o dyspnea upon exertion x several days. Pt also reports generalized weakness x several weeks that has progressively worsened accompanied by Has. Pt states she was admitted several months ago for fluid overload. Pt states she has been unable to takes some of her BP meds over the last 7-8 days due to being out.

## 2021-07-17 NOTE — H&P (Addendum)
Date: 07/18/2021               [name] MRN: [id number]  DOB: July 13, 1951 Age / Sex: 70 y.o., female   PCP: Just, Laurita Quint, FNP (Inactive)         Medical Service: Internal Medicine Teaching Service         Attending Physician: Dr. Lucious Groves, DO    First Contact: [name] MD Pager: Teodora Medici [phone]  Second Contact: Mitzi Hansen, MD Pager: Cassell Smiles [phone]       After Hours (After 5p/  First Contact Pager: [phone]  weekends / holidays): Second Contact Pager: [phone]   SUBJECTIVE   Chief Complaint: Headaches. Shortness of Breath  History of Present Illness:   Presents with headache and progressively worsening shortness of breath and dyspnea on exertion.  Headache has been present for the past couple of days and is mild to moderate in severity.  She has been out of medications for the past few weeks but decided that she could not wait until her outpatient appointment on the seventh. Blood pressure at last office visit was 160/80's. When she had her stroke, her pressure was up in the 200's.   Chest pain she describes as gas. Denies hx of indigestion. She is short of breath with exertion and activities such as standing up from laying down. Denies shortness of breath at rest. Endorses wrist swelling and leg swelling.  Reports some lower extremity edema recently but that it has improved.  She endorses blurry vision for the past few months. Denies ringing in her ears.  ED Course: Patient was noted to be severely hypertensive in the 242/104.  CBC was unremarkable CMP with creatinine near baseline of 1.8.  UA was not concerning for infection.  Troponin is mildly elevated to 33.  Her baseline is 26.  NP was ordered and was significantly elevated.  Chest x-ray was ordered which showed some pulmonary vascular congestion but no overt edema.  CT of the head was obtained given her headaches in the context of hypertension but was negative for acute bleed or other intracranial  abnormality.  EKG showed no ischemic changes but did show prolonged QRS interval.  She was given IV metoprolol but did not respond and did not become bradycardic.  She was trialed on hydralazine but did not respond to that either.  She was started on nitro drip with improvement of blood pressure down to MAP of 123.  Ultrasound of the lower extremity was also obtained due to calf tenderness but showed no DVT.  Meds:  Aspirin 81 mg daily Flonase PRN Irbesartan 75 mg daily Metoprolol Succinate 50 mg daily Nifedipine 60 mg daily Crestor 10 mg dailly    Past Medical History:  Diagnosis Date   Allergy    Arthritis    Chronic headaches    Constipation    Depression    Diabetes mellitus    Hyperlipidemia    Hypertension    Obesity    PAD (peripheral artery disease) (HCC)    Shortness of breath    Stage 3 chronic kidney disease ([place])    Stroke [facility])     Past Surgical History:  Procedure Laterality Date   ABDOMINAL AORTOGRAM W/LOWER EXTREMITY N/A 12/17/2018   Procedure: ABDOMINAL AORTOGRAM W/LOWER EXTREMITY;  Surgeon: Waynetta Sandy, MD;  Location: [facility];  Service: Cardiovascular;  Laterality: N/A;   FEMORAL BYPASS  02/23/2011   Left Common Femoral to Below-knee popliteasl BPG  by Dr. Bridgett Larsson   PERIPHERAL VASCULAR ATHERECTOMY Left 12/17/2018   Procedure: PERIPHERAL VASCULAR ATHERECTOMY;  Surgeon: Waynetta Sandy, MD;  Location: [facility];  Service: Cardiovascular;  Laterality: Left;   PERIPHERAL VASCULAR BALLOON ANGIOPLASTY Left 12/17/2018   Procedure: PERIPHERAL VASCULAR BALLOON ANGIOPLASTY;  Surgeon: Waynetta Sandy, MD;  Location: [place] [facility];  Service: Cardiovascular;  Laterality: Left;    Social:  Lives With: Alone  Occupation: Retired Teacher, English as a foreign language Support: None Level of Function:  PCP: None Substances: Half PPD for 50 years, Occasional alcohol use, Hx of marijuana and cocaine use.   Family History: Father -  DM Mother - CABG at 74's Siblings - Sister CVA at 67 y/o, Brother MI w/ PCI,  Children  Allergies: Allergies as of 07/17/2021 - Review Complete 07/17/2021  Allergen Reaction Noted   Atorvastatin Swelling 04/22/2019   Nifedipine er Other (See Comments) 04/03/2018   Latex Rash 04/03/2018  Jardiance - Passing out  Review of Systems: A complete ROS was negative except as per HPI.   OBJECTIVE:   Physical Exam: Blood pressure (!) 258/103, pulse 73, temperature 98.2 F (36.8 C), temperature source Oral, resp. rate (!) 24, height 5\' 7"  (1.702 m), weight 114.8 kg, SpO2 97 %.  Constitutional: well-appearing, sitting in bed, in no acute distress HENT: normocephalic atraumatic, mucous membranes moist Eyes: conjunctiva non-erythematous Neck: supple Cardiovascular: Regular rate and rhythm, jugular venous distention, no lower extremity edema.  Pulses intact and symmetric.  Murmur Pulmonary/Chest: normal work of breathing on room air, lungs clear to auscultation bilaterally Abdominal: soft, non-tender, non-distended MSK: normal bulk and tone Neurological: alert & oriented x 3,  Skin: warm and dry Psych: Appropriate mood, normal affect  Labs: CBC    Component Value Date/Time   WBC 5.7 07/17/2021 1600   RBC 4.11 07/17/2021 1600   HGB 12.5 07/17/2021 1600   HCT 38.7 07/17/2021 1600   PLT 252 07/17/2021 1600   MCV 94.2 07/17/2021 1600   MCH 30.4 07/17/2021 1600   MCHC 32.3 07/17/2021 1600   RDW 14.2 07/17/2021 1600   LYMPHSABS 1.4 07/17/2021 1600   MONOABS 0.5 07/17/2021 1600   EOSABS 0.1 07/17/2021 1600   BASOSABS 0.0 07/17/2021 1600     CMP     Component Value Date/Time   NA 142 07/17/2021 1600   NA 138 06/15/2020 1214   K 3.5 07/17/2021 1600   CL 105 07/17/2021 1600   CO2 30 07/17/2021 1600   GLUCOSE 168 (H) 07/17/2021 1600   BUN 31 (H) 07/17/2021 1600   BUN 24 06/15/2020 1214   CREATININE 1.82 (H) 07/17/2021 1600   CALCIUM 8.8 (L) 07/17/2021 1600   PROT 6.6 07/17/2021  1600   PROT 7.4 06/15/2020 1214   ALBUMIN 3.1 (L) 07/17/2021 1600   ALBUMIN 3.7 (L) 06/15/2020 1214   AST 12 (L) 07/17/2021 1600   ALT 12 07/17/2021 1600   ALKPHOS 80 07/17/2021 1600   BILITOT 0.7 07/17/2021 1600   BILITOT 0.4 06/15/2020 1214   GFRNONAA 30 (L) 07/17/2021 1600   GFRAA 35 (L) 06/15/2020 1214    Imaging: DG Chest 2 View  Result Date: 07/17/2021 CLINICAL DATA:  sob EXAM: CHEST - 2 VIEW COMPARISON:  March 13, 2020 FINDINGS: The cardiomediastinal silhouette is unchanged and enlarged in contour.Trace fluid in the RIGHT minor fissure. No pleural effusion. No pneumothorax. Perihilar vascular fullness. No acute pleuroparenchymal abnormality. Visualized abdomen is unremarkable. Advanced degenerative changes of the RIGHT shoulder. IMPRESSION: Perihilar vascular fullness without overt  edema. Electronically Signed   By: Valentino Saxon M.D.   On: 07/17/2021 17:33   CT Head Wo Contrast  Result Date: 07/17/2021 CLINICAL DATA:  Headache.  Hypertension. EXAM: CT HEAD WITHOUT CONTRAST TECHNIQUE: Contiguous axial images were obtained from the base of the skull through the vertex without intravenous contrast. COMPARISON:  Mar 23, 2018. FINDINGS: Brain: Mild chronic ischemic white matter disease is noted. Old right cerebellar infarction is noted. No mass effect or midline shift is noted. Ventricular size is within normal limits. There is no evidence of mass lesion, hemorrhage or acute infarction. Vascular: No hyperdense vessel or unexpected calcification. Skull: Normal. Negative for fracture or focal lesion. Sinuses/Orbits: No acute finding. Other: None. IMPRESSION: No acute intracranial abnormality seen. Electronically Signed   By: Marijo Conception M.D.   On: 07/17/2021 16:26   VAS [place] LOWER EXTREMITY VENOUS (DVT) ([facility] and [facility] 7a-7p)  Result Date: 07/17/2021  Lower Venous DVT Study [name]  Date of Exam:   07/17/2021 Medical Rec #: [id number]             Accession #:     [id number] Date of Birth: Apr 04, 1951             Patient Gender: F Patient Age:   23 years Exam Location:  [facility] Procedure:      VAS [place] LOWER EXTREMITY VENOUS (DVT) Referring Phys: [facility] SPONSELLER --------------------------------------------------------------------------------  Indications: Edema.  Risk Factors: CHF. Limitations: Body habitus and poor ultrasound/tissue interface. Comparison Study: Prior negative left LEV done 02/28/20 Performing Technologist: Sharion Dove RVS  Examination Guidelines: A complete evaluation includes B-mode imaging, spectral Doppler, color Doppler, and power Doppler as needed of all accessible portions of each vessel. Bilateral testing is considered an integral part of a complete examination. Limited examinations for reoccurring indications may be performed as noted. The reflux portion of the exam is performed with the patient in reverse Trendelenburg.  +-----+---------------+---------+-----------+----------+--------------+ RIGHTCompressibilityPhasicitySpontaneityPropertiesThrombus Aging +-----+---------------+---------+-----------+----------+--------------+ CFV  Full           Yes      Yes                                 +-----+---------------+---------+-----------+----------+--------------+   +---------+---------------+---------+-----------+----------+-------------------+ LEFT     CompressibilityPhasicitySpontaneityPropertiesThrombus Aging      +---------+---------------+---------+-----------+----------+-------------------+ CFV      Full           Yes      Yes                                      +---------+---------------+---------+-----------+----------+-------------------+ SFJ      Full                                                             +---------+---------------+---------+-----------+----------+-------------------+ FV Prox  Full                                                              +---------+---------------+---------+-----------+----------+-------------------+  FV Mid   Full                                                             +---------+---------------+---------+-----------+----------+-------------------+ FV DistalFull                                                             +---------+---------------+---------+-----------+----------+-------------------+ PFV                                                   Not well visualized +---------+---------------+---------+-----------+----------+-------------------+ POP                                                   patent by color and                                                       Doppler             +---------+---------------+---------+-----------+----------+-------------------+ PTV      Full                                                             +---------+---------------+---------+-----------+----------+-------------------+ PERO     Full                                                             +---------+---------------+---------+-----------+----------+-------------------+    Summary: RIGHT: - No evidence of common femoral vein obstruction.  LEFT: - There is no evidence of deep vein thrombosis in the lower extremity. However, portions of this examination were limited- see technologist comments above.  *See table(s) above for measurements and observations.    Preliminary     EKG: personally reviewed my interpretation is sinus rhythm   ASSESSMENT & PLAN:    Assessment & Plan by Problem: Active Problems:   Hypertensive urgency   Alicia [name] is a 70 y.o. with pertinent PMH of essential hypertension, congestive heart failure, renal insufficiency, prior CVA who presented with concerns for progressively worsening shortness of breath and dyspnea on exertion and admitted for hypertensive urgency and congestive heart failure on hospital day 0  #hypertensive  urgency Patient presenting with blood pressures in the 376E systolic.  She takes irbesartan 75 metoprolol succinate 50 and nifedipine 60 mg as an  outpatient however she has not taken these medications in several days.  She is endorsing a mild headache currently for the past several days.  Head CT negative.  also endorsing vision changes which have been present since before she ran out of her medications.  Reporting some chest pain which she describes as gas. Troponin has been flat minimally elevated. She was given IV hydralazine and metoprolol while in the emergency department which had no effect.  Blood pressures were still in the 161W systolic.   - She was started on IV nitro drip for better control with goal of decreasing MAP 10% and first 1 hour to a map of 130 25% first 24 to MAP of 108.   - While in the emergency department blood pressure dramatically improved with nitro drip down to 180s.  Will maintain drip at current rate for now.   - Once blood pressures have improved can transition to her oral home medications in the morning.   #HFpEF with acute decompensation Echo done in April 2021 showing LVEF of 55 to 60%. She is on metoprolol and irbesartan Patient is also reporting shortness of breath has a low functional status, short of breath even with standing.  BNP noted to be over thousand with some jugular venous distention on physical exam.  Chest x-ray with perihilar vascular fullness without overt edema.  Currently satting 99% on room air.   -Continue Lasix 40 IV twice daily - Continue to trend I's and O's.   - Monitor BMP and magnesium.  #Diabetes mellitus Sliding scale and CBG checks  CKD stage IIIb Most recent GFR 30.  Creatinine 1.82.  Baseline appears to be between 1.5 and 1.8. Monitor  Hyperlipidemia Continue statin   Diet: Carb-Modified VTE: Enoxaparin IVF: None,None Code: Full  Prior to Admission Living Arrangement: Home, living   Anticipated Discharge Location:  Home Barriers to Discharge: Pending clinical improvement  Dispo: Admit patient to Observation with expected length of stay less than 2 midnights.  Signed: Delene Ruffini, MD Internal Medicine Resident PGY-1 Pager: [phone]  07/18/2021, 2:36 AM

## 2021-07-17 NOTE — Progress Notes (Signed)
VASCULAR LAB    Left lower extremity venous duplex has been performed.  See CV proc for preliminary results.   Lyah Millirons, RVT 07/17/2021, 6:41 PM

## 2021-07-18 ENCOUNTER — Observation Stay (HOSPITAL_BASED_OUTPATIENT_CLINIC_OR_DEPARTMENT_OTHER): Payer: Medicare HMO

## 2021-07-18 ENCOUNTER — Encounter (HOSPITAL_COMMUNITY): Payer: Self-pay | Admitting: Internal Medicine

## 2021-07-18 DIAGNOSIS — I5031 Acute diastolic (congestive) heart failure: Secondary | ICD-10-CM | POA: Diagnosis not present

## 2021-07-18 DIAGNOSIS — I110 Hypertensive heart disease with heart failure: Secondary | ICD-10-CM | POA: Diagnosis present

## 2021-07-18 DIAGNOSIS — I160 Hypertensive urgency: Secondary | ICD-10-CM

## 2021-07-18 DIAGNOSIS — I509 Heart failure, unspecified: Secondary | ICD-10-CM

## 2021-07-18 DIAGNOSIS — I5033 Acute on chronic diastolic (congestive) heart failure: Secondary | ICD-10-CM

## 2021-07-18 DIAGNOSIS — I5030 Unspecified diastolic (congestive) heart failure: Secondary | ICD-10-CM

## 2021-07-18 LAB — COMPREHENSIVE METABOLIC PANEL
ALT: 11 U/L (ref 0–44)
AST: 15 U/L (ref 15–41)
Albumin: 2.9 g/dL — ABNORMAL LOW (ref 3.5–5.0)
Alkaline Phosphatase: 77 U/L (ref 38–126)
Anion gap: 8 (ref 5–15)
BUN: 31 mg/dL — ABNORMAL HIGH (ref 8–23)
CO2: 28 mmol/L (ref 22–32)
Calcium: 8.4 mg/dL — ABNORMAL LOW (ref 8.9–10.3)
Chloride: 102 mmol/L (ref 98–111)
Creatinine, Ser: 1.9 mg/dL — ABNORMAL HIGH (ref 0.44–1.00)
GFR, Estimated: 28 mL/min — ABNORMAL LOW (ref >60)
Glucose, Bld: 219 mg/dL — ABNORMAL HIGH (ref 70–99)
Potassium: 3.5 mmol/L (ref 3.5–5.1)
Sodium: 138 mmol/L (ref 135–145)
Total Bilirubin: 0.9 mg/dL (ref 0.3–1.2)
Total Protein: 6.1 g/dL — ABNORMAL LOW (ref 6.5–8.1)

## 2021-07-18 LAB — CBC WITH DIFFERENTIAL/PLATELET
Abs Immature Granulocytes: 0.02 10*3/uL (ref 0.00–0.07)
Basophils Absolute: 0 10*3/uL (ref 0.0–0.1)
Basophils Relative: 0 %
Eosinophils Absolute: 0.1 10*3/uL (ref 0.0–0.5)
Eosinophils Relative: 1 %
HCT: 36.1 % (ref 36.0–46.0)
Hemoglobin: 11.8 g/dL — ABNORMAL LOW (ref 12.0–15.0)
Immature Granulocytes: 0 %
Lymphocytes Relative: 17 %
Lymphs Abs: 1.1 10*3/uL (ref 0.7–4.0)
MCH: 30.9 pg (ref 26.0–34.0)
MCHC: 32.7 g/dL (ref 30.0–36.0)
MCV: 94.5 fL (ref 80.0–100.0)
Monocytes Absolute: 0.5 10*3/uL (ref 0.1–1.0)
Monocytes Relative: 7 %
Neutro Abs: 5 10*3/uL (ref 1.7–7.7)
Neutrophils Relative %: 75 %
Platelets: 247 10*3/uL (ref 150–400)
RBC: 3.82 MIL/uL — ABNORMAL LOW (ref 3.87–5.11)
RDW: 14.2 % (ref 11.5–15.5)
WBC: 6.7 10*3/uL (ref 4.0–10.5)
nRBC: 0 % (ref 0.0–0.2)

## 2021-07-18 LAB — MAGNESIUM: Magnesium: 1.9 mg/dL (ref 1.7–2.4)

## 2021-07-18 LAB — GLUCOSE, CAPILLARY
Glucose-Capillary: 224 mg/dL — ABNORMAL HIGH (ref 70–99)
Glucose-Capillary: 190 mg/dL — ABNORMAL HIGH (ref 70–99)
Glucose-Capillary: 238 mg/dL — ABNORMAL HIGH (ref 70–99)
Glucose-Capillary: 169 mg/dL — ABNORMAL HIGH (ref 70–99)

## 2021-07-18 LAB — ECHOCARDIOGRAM COMPLETE
Height: 67 in
P 1/2 time: 549 msec
S' Lateral: 3.5 cm
Weight: 4048 oz

## 2021-07-18 LAB — HEMOGLOBIN A1C
Hgb A1c MFr Bld: 8 % — ABNORMAL HIGH (ref 4.8–5.6)
Mean Plasma Glucose: 182.9 mg/dL

## 2021-07-18 LAB — HIV ANTIBODY (ROUTINE TESTING W REFLEX): HIV Screen 4th Generation wRfx: NONREACTIVE

## 2021-07-18 MED ORDER — METOPROLOL SUCCINATE ER 50 MG PO TB24
50.0000 mg | ORAL_TABLET | Freq: Every day | ORAL | Status: DC
  Administered 2021-07-18 – 2021-07-19 (×2): 50 mg via ORAL
  Filled 2021-07-18 (×2): qty 1

## 2021-07-18 MED ORDER — IRBESARTAN 75 MG PO TABS
75.0000 mg | ORAL_TABLET | Freq: Every day | ORAL | Status: DC
  Administered 2021-07-18 – 2021-07-19 (×2): 75 mg via ORAL
  Filled 2021-07-18 (×2): qty 1

## 2021-07-18 MED ORDER — NIFEDIPINE ER OSMOTIC RELEASE 60 MG PO TB24
60.0000 mg | ORAL_TABLET | Freq: Every day | ORAL | Status: DC
  Administered 2021-07-18 – 2021-07-19 (×2): 60 mg via ORAL
  Filled 2021-07-18 (×2): qty 1

## 2021-07-18 NOTE — Progress Notes (Signed)
Echocardiogram 2D Echocardiogram has been performed.  Fidel Levy 07/18/2021, 10:58 AM

## 2021-07-18 NOTE — ED Notes (Signed)
Attempted report x1. Told I would be called back.

## 2021-07-18 NOTE — Progress Notes (Signed)
  Alicia [name] is a 70 y.o. with PMH of HTN, HLD, DMII, PAD, CKD III, stroke, and obesity a admit for hypertensive urgency on hospital day 1. Subjective:  Patient endorses some relief of symptoms since coming in the hospital. Denied any CP or shortness of breath. Overall, denied any acute complaints.    Overnight: No acute events overnight.   Objective:  Vital signs in last 24 hours: Vitals:   07/18/21 0716 07/18/21 0800 07/18/21 0945 07/18/21 1006  BP: (!) 166/103 (!) 179/103 (!) 165/73 (!) 176/80  Pulse: 82 94 80 89  Resp: 20 (!) 24 18 19   Temp: 98.3 F (36.8 C)     TempSrc: Oral     SpO2: 97% 97% 90% 94%  Weight:      Height:       Physical Exam General: Well appearing, pleasant, NAD, obese female CV: regular rate/rhythm, JVD present to angel of mandible, 2+ pulses Lungs: Bibasilar crackles present MSK: minimal pitting edema   Assessment/Plan:  Active Problems:   Hypertensive urgency Hypertensive Urgency Patient presented with BP 637C systolic. Home meds include Ibestartan 75 mg, metoprolol succinate 50 mg, Nifedipine 60 mg. Currently bp is 176/80. Will restart home medicines and wean down nitro drip.   Plan: -Restart home meds -Wean down nitro drip slowly -Continue to monitor  HFpEF Patient presented with volume overload and continues to display signs of volume overload. She is breathing well on RA.   Plan: -Continue 40 mg IV lasix BID today -Assess volume status -Measure input/output, daily weights -Repeat Echo -Continue to monitor  Diabetes Patient has hx of diabetes type II and not on any therapy due side effects. A1c is 8.0.  Plan: -SSI -CBG with meal  CKD stage IIIb Patient has baseline CKD. Current creatinine is 1.90.  Plan: -Renally dose meds -Avoid nephrotoxic drugs -Continue to monitor with daily BMP  HLD Patient has HLD with last lipid panel in 06/15/2020 showing cholestrol of 187, LDL of 114.  Plan -Continue statin -Repeat  lipid panel  DVT prophx:Lovenox  Diet:Heart/Carb modified Bowel:Prn Code:Full  Prior to Admission Living Arrangement:Home Anticipated Discharge Location:Home Barriers to Discharge:Medical mgmt Dispo: Anticipated discharge in approximately 1 day(s).   [name], MD 07/18/2021, 10:30 AM Pager: [phone] After 5pm on weekdays and 1pm on weekends:

## 2021-07-19 ENCOUNTER — Other Ambulatory Visit (HOSPITAL_COMMUNITY): Payer: Self-pay

## 2021-07-19 ENCOUNTER — Telehealth: Payer: Self-pay

## 2021-07-19 DIAGNOSIS — I160 Hypertensive urgency: Secondary | ICD-10-CM | POA: Diagnosis not present

## 2021-07-19 DIAGNOSIS — I110 Hypertensive heart disease with heart failure: Secondary | ICD-10-CM | POA: Diagnosis not present

## 2021-07-19 DIAGNOSIS — I5033 Acute on chronic diastolic (congestive) heart failure: Secondary | ICD-10-CM | POA: Diagnosis not present

## 2021-07-19 LAB — CBC WITH DIFFERENTIAL/PLATELET
Abs Immature Granulocytes: 0.06 10*3/uL (ref 0.00–0.07)
Basophils Absolute: 0 10*3/uL (ref 0.0–0.1)
Basophils Relative: 0 %
Eosinophils Absolute: 0.1 10*3/uL (ref 0.0–0.5)
Eosinophils Relative: 2 %
HCT: 37.5 % (ref 36.0–46.0)
Hemoglobin: 12.3 g/dL (ref 12.0–15.0)
Immature Granulocytes: 1 %
Lymphocytes Relative: 18 %
Lymphs Abs: 1.2 10*3/uL (ref 0.7–4.0)
MCH: 30.4 pg (ref 26.0–34.0)
MCHC: 32.8 g/dL (ref 30.0–36.0)
MCV: 92.8 fL (ref 80.0–100.0)
Monocytes Absolute: 0.7 10*3/uL (ref 0.1–1.0)
Monocytes Relative: 11 %
Neutro Abs: 4.6 10*3/uL (ref 1.7–7.7)
Neutrophils Relative %: 68 %
Platelets: 252 10*3/uL (ref 150–400)
RBC: 4.04 MIL/uL (ref 3.87–5.11)
RDW: 14.1 % (ref 11.5–15.5)
WBC: 6.6 10*3/uL (ref 4.0–10.5)
nRBC: 0 % (ref 0.0–0.2)

## 2021-07-19 LAB — LIPID PANEL
Cholesterol: 140 mg/dL (ref 0–200)
HDL: 48 mg/dL (ref >40)
LDL Cholesterol: 77 mg/dL (ref 0–99)
Total CHOL/HDL Ratio: 2.9 RATIO
Triglycerides: 73 mg/dL (ref <150)
VLDL: 15 mg/dL (ref 0–40)

## 2021-07-19 LAB — BASIC METABOLIC PANEL
Anion gap: 11 (ref 5–15)
BUN: 41 mg/dL — ABNORMAL HIGH (ref 8–23)
CO2: 29 mmol/L (ref 22–32)
Calcium: 8.7 mg/dL — ABNORMAL LOW (ref 8.9–10.3)
Chloride: 99 mmol/L (ref 98–111)
Creatinine, Ser: 2.37 mg/dL — ABNORMAL HIGH (ref 0.44–1.00)
GFR, Estimated: 22 mL/min — ABNORMAL LOW (ref >60)
Glucose, Bld: 185 mg/dL — ABNORMAL HIGH (ref 70–99)
Potassium: 3.1 mmol/L — ABNORMAL LOW (ref 3.5–5.1)
Sodium: 139 mmol/L (ref 135–145)

## 2021-07-19 LAB — GLUCOSE, CAPILLARY
Glucose-Capillary: 173 mg/dL — ABNORMAL HIGH (ref 70–99)
Glucose-Capillary: 223 mg/dL — ABNORMAL HIGH (ref 70–99)

## 2021-07-19 MED ORDER — ACETAMINOPHEN 500 MG PO TABS
1000.0000 mg | ORAL_TABLET | Freq: Three times a day (TID) | ORAL | Status: DC
  Administered 2021-07-19 (×2): 1000 mg via ORAL
  Filled 2021-07-19 (×2): qty 2

## 2021-07-19 MED ORDER — DICLOFENAC SODIUM 1 % EX GEL
4.0000 g | Freq: Four times a day (QID) | CUTANEOUS | Status: DC
  Administered 2021-07-19 (×2): 4 g via TOPICAL
  Filled 2021-07-19: qty 100

## 2021-07-19 MED ORDER — ACETAMINOPHEN 500 MG PO TABS
1000.0000 mg | ORAL_TABLET | Freq: Three times a day (TID) | ORAL | 0 refills | Status: DC | PRN
  Filled 2021-07-19: qty 30, 5d supply, fill #0

## 2021-07-19 MED ORDER — SITAGLIPTIN PHOSPHATE 25 MG PO TABS
25.0000 mg | ORAL_TABLET | Freq: Every day | ORAL | 0 refills | Status: DC
  Filled 2021-07-19: qty 30, 30d supply, fill #0

## 2021-07-19 MED ORDER — LABETALOL HCL 5 MG/ML IV SOLN: 5.0000 mg | INTRAVENOUS | Status: DC | PRN

## 2021-07-19 MED ORDER — DICLOFENAC SODIUM 1 % EX GEL
4.0000 g | Freq: Four times a day (QID) | CUTANEOUS | 0 refills | Status: DC | PRN
  Filled 2021-07-19: qty 100, 7d supply, fill #0

## 2021-07-19 MED ORDER — POTASSIUM CHLORIDE CRYS ER 20 MEQ PO TBCR
40.0000 meq | EXTENDED_RELEASE_TABLET | ORAL | Status: AC
  Administered 2021-07-19 (×2): 40 meq via ORAL
  Filled 2021-07-19 (×2): qty 2

## 2021-07-19 MED ORDER — FUROSEMIDE 40 MG PO TABS
40.0000 mg | ORAL_TABLET | Freq: Every day | ORAL | 0 refills | Status: DC
  Filled 2021-07-19: qty 30, 30d supply, fill #0

## 2021-07-19 NOTE — Progress Notes (Addendum)
Inpatient Diabetes Program Recommendations  AACE/ADA: New Consensus Statement on Inpatient Glycemic Control   Target Ranges:  Prepandial:   less than 140 mg/dL      Peak postprandial:   less than 180 mg/dL (1-2 hours)      Critically ill patients:  140 - 180 mg/dL  Results for Alicia [name], Alicia [name] (MRN [id number]) as of 07/19/2021 10:56  Ref. Range 07/18/2021 05:46 07/18/2021 11:34 07/18/2021 16:47 07/18/2021 21:39 07/19/2021 06:06  Glucose-Capillary Latest Ref Range: 70 - 99 mg/dL 224 (H) 190 (H) 238 (H) 169 (H) 173 (H)   Results for Alicia [name], Alicia [name] (MRN [id number]) as of 07/19/2021 10:56  Ref. Range 06/15/2020 12:14 07/18/2021 06:47  Hemoglobin A1C Latest Ref Range: 4.8 - 5.6 % 7.0 (H) 8.0 (H)    Review of Glycemic Control  Diabetes history: DM2 Outpatient Diabetes medications: None listed; has been prescribed Januvia 50 mg daily, Jardiance 10 mg daily, Amaryl 1 mg daily, Trulicity 5.63 mg Qweek  Current orders for Inpatient glycemic control: Novolog 0-9 units TID with meals, Novolog 0-5 units QHS  Inpatient Diabetes Program Recommendations:    HbgA1C: A1C 8% on 07/18/21 indicating an average glucose of 183 mg/dl over the past 2-3 months. At time of discharge, may want to consider discharging on Amaryl 1 mg daily.  NOTE: Per chart, patient has DM2 hx and per H&P has been out of medications for several weeks. Per telemedicine note on 12/07/20, patient has been prescribed Trulicity 8.75 mg Qweek, Jardiance 10 mg daily, Amaryl 1 mg daily, and Januvia 50 mg daily. Will plan to speak with patient.  Addendum 07/19/21@12 :45-Spoke with patient regarding DM. Patient states that she was taking a diabetes pill but not sure of the name of it. Patient reports that she still has diabetes medication at home but not taking consistently. Patient reports her glucose has ranged from 150-200 mg/dl when she checks it. [facility] to inquire about DM medications and told that they have Amaryl 1 mg daily  (filled last in October 2021), Jardiance 10 mg daily (filled last February 6433), and Trulicity 2.95 mg Qweek. Patient reports that she stopped taking Jardiance and Trulicity because they made her sick. She states she thought she was taking Januvia but pharmacy does not have prescription for Januvia at all. Patient is unsure about the Amaryl (if she stopped or her provider stopped). Patient reports she had been on Metformin in the past and tolerated it well but her providers had stopped it. Due to current renal function on labs today, would not recommend Metformin at this time but providers could consider in the future if kidney function improves.  Patient states that she is unsure what her last A1C was. Discussed current A1C of 8% on 07/18/21 indicating an average glucose of 184 mg/dl. Patient states that she will not take Jardiance due to side effects. Inquired about taking low dose Amaryl and patient states she would be willing to take it if prescribed. Discussed how Amaryl works. Reviewed hypoglycemia symptoms and treatment. Asked patient to check glucose 2 times per day from discharge until she follows up in the clinic on Thursday. Asked that she let providers know how glucose has been trending and to be sure to make them aware if she has any hypoglycemia at all. Patient verbalized understanding of information discussed and she states she has no questions at this time.  Thanks, Barnie Alderman, RN, MSN, CDE Diabetes Coordinator Inpatient Diabetes Program [phone] (Team Pager from 8am to 5pm)

## 2021-07-19 NOTE — Progress Notes (Addendum)
Alicia [name] is a 70 y.o. with PMH of HTN, HLD, DMII, PAD, CKD III, stroke, and obesity a admit for hypertensive urgency on hospital day 1. Subjective:  Patient complained of headache and dizziness. Denied chest pain, shortness of breath. Denied any other acute complaints.    Overnight: No acute events overnight.   Objective:  Vital signs in last 24 hours: Vitals:   07/19/21 0050 07/19/21 0554 07/19/21 0731 07/19/21 1150  BP:  (!) 158/72 (!) 171/84 (!) 166/83  Pulse: 80 84 84 72  Resp: (!) 22 20 20 20   Temp:  98.8 F (37.1 C) 98.7 F (37.1 C) 98.6 F (37 C)  TempSrc:  Oral Oral Oral  SpO2: 97% 100% 93% 96%  Weight:  121.9 kg    Height:       Physical Exam General: Well appearing, pleasant, NAD, obese female CV: regular rate/rhythm, JVD present, 2+ pulses Lungs: Bibasilar crackles present improved from yesterday MSK: minimal pitting edema     CBC Latest Ref Rng & Units 07/19/2021 07/18/2021 07/17/2021  WBC 4.0 - 10.5 K/uL 6.6 6.7 5.7  Hemoglobin 12.0 - 15.0 g/dL 12.3 11.8(L) 12.5  Hematocrit 36.0 - 46.0 % 37.5 36.1 38.7  Platelets 150 - 400 K/uL 252 247 252    CMP Latest Ref Rng & Units 07/19/2021 07/18/2021 07/17/2021  Glucose 70 - 99 mg/dL 185(H) 219(H) 168(H)  BUN 8 - 23 mg/dL 41(H) 31(H) 31(H)  Creatinine 0.44 - 1.00 mg/dL 2.37(H) 1.90(H) 1.82(H)  Sodium 135 - 145 mmol/L 139 138 142  Potassium 3.5 - 5.1 mmol/L 3.1(L) 3.5 3.5  Chloride 98 - 111 mmol/L 99 102 105  CO2 22 - 32 mmol/L 29 28 30   Calcium 8.9 - 10.3 mg/dL 8.7(L) 8.4(L) 8.8(L)  Total Protein 6.5 - 8.1 g/dL - 6.1(L) 6.6  Total Bilirubin 0.3 - 1.2 mg/dL - 0.9 0.7  Alkaline Phos 38 - 126 U/L - 77 80  AST 15 - 41 U/L - 15 12(L)  ALT 0 - 44 U/L - 11 12    ECHO 08/28 1.Left ventricular ejection fraction, by estimation, is 55 to 60%. The  left ventricle has normal function. The left ventricle has no regional  wall motion abnormalities. There is moderate concentric left ventricular  hypertrophy.  Indeterminate diastolic  filling due to E-A fusion.   2. Right ventricular systolic function is normal. The right ventricular  size is normal. Tricuspid regurgitation signal is inadequate for assessing  PA pressure.   3. The mitral valve is normal in structure. Trivial mitral valve  regurgitation. No evidence of mitral stenosis.   4. The aortic valve was not well visualized. There is moderate  calcification of the aortic valve. Aortic valve regurgitation is mild to  moderate. Mild to moderate aortic valve sclerosis/calcification is  present, without any evidence of aortic stenosis.   Comparison(s): No significant change from prior study.  Assessment/Plan:  Active Problems:   Hypertensive urgency   CHF exacerbation (HCC)   LVH (left ventricular hypertrophy) due to hypertensive disease, with heart failure ([place]) Hypertensive Urgency Patient presented with BP 671I systolic. Home meds include Ibestartan 75 mg, metoprolol succinate 50 mg, Nifedipine 60 mg. Currently bp is 158/72. Restarted home meds and stopped nitro drip.  Plan: -Restart home meds -Continue to monitor  HFpEF Patient presented with volume overload and continues to display signs of volume overload. She is breathing well on RA. ECHO unchanged from previous study.   Plan: -Oral lasix 40 mg daily  -Assess volume status -  Measure input/output, daily weights  -Total output 1.85L; net negative -1.09L -Continue to monitor  Diabetes Patient has hx of diabetes type II and not on any therapy due side effects per patients. A1c is 8.0.  Plan: -SSI -CBG with meal -Discharge with oral antihyperglycemic  CKD stage IIIb Patient has baseline CKD. Current creatinine is 2.37. Could be due to restart of ARB as patient is still volume overloaded. Follow up outpatient bmp to monitor for resolution.   Plan: -Renally dose meds -Avoid nephrotoxic drugs -Continue to monitor with daily BMP  HLD Lipid panel 08/29 wnl. Cholesterol  140, LDL 77. Plan -Continue statin   DVT prophx:Lovenox  Diet:Heart/Carb modified Bowel:Prn Code:Full  Prior to Admission Living Arrangement:Home Anticipated Discharge Location:Home Barriers to Discharge:Medical mgmt Dispo: Anticipated discharge in approximately 0-1 day(s).   [name], MD 07/19/2021, 2:45 PM Pager: [phone] After 5pm on weekdays and 1pm on weekends:On call pager.

## 2021-07-19 NOTE — Telephone Encounter (Signed)
Dr Humphrey Rolls called requested a Hospital f/u for pt being discharged today 07/19/21 .. was given 07/22/21@ 10:45 with Dr Gilford Rile

## 2021-07-19 NOTE — Progress Notes (Signed)
Heart Failure Navigator Progress Note  Assessed for Heart & Vascular TOC clinic readiness.  Patient does not meet criteria due to AKI, SCr 2.37. Pt has quick f/u appt with [facility] on 9/1.    Navigator available for further HF resources.   Pricilla Holm, MSN, RN Heart Failure Nurse Navigator [phone]

## 2021-07-19 NOTE — Progress Notes (Signed)
Per CCMD pt had a 19 beat run of SVT curretly back in NSR. On call provider notified via page, and secure chat. Spoke to MD Ariwodo, no new orders at this time.

## 2021-07-19 NOTE — TOC Progression Note (Addendum)
Transition of Care [facility]) - Progression Note    Patient Details  Name: Alicia [name] MRN: [id number] Date of Birth: 1951/03/17  Transition of Care [facility]) CM/SW Contact  Zenon Mayo, RN Phone Number: 07/19/2021, 3:53 PM  Clinical Narrative:    NCM spoke with patient at bedside, she states she will need transportation home at dc.  Waiver form signed for [employer] transport.  She is indep.   She has a walker and a cane at home she lives alone.  She states she has a follow up at the internal med clinic  after discharge.        Expected Discharge Plan and Services           Expected Discharge Date: 07/19/21                                     Social Determinants of Health (SDOH) Interventions    Readmission Risk Interventions Readmission Risk Prevention Plan 03/02/2020  Transportation Screening Complete  PCP or Specialist Appt within 5-7 Days Complete  Home Care Screening Complete  Medication Review (RN CM) Complete  Some recent data might be hidden

## 2021-07-19 NOTE — Discharge Summary (Addendum)
Name: Alicia [name] MRN: [id number] DOB: [birth year] 70 y.o. PCP: Just, Laurita Quint, FNP (Inactive)  Date of Admission: 07/17/2021  2:47 PM Date of Discharge: 07/19/21 Attending Physician: Lucious Groves, DO  Discharge Diagnosis: 1. Hypertensive Urgency 2. HFpEF 3. Hypertension 4. Diabetes Mellitus Type II 5. CKD IIIb 6. Hyperlipidemia  Discharge Medications: Allergies as of 07/19/2021       Reactions   Atorvastatin Swelling   Lip swelling   Nifedipine Er Other (See Comments)   Caused nose bleeds in higher doses (90 mg., namely)   Latex Rash        Medication List     STOP taking these medications    chlorthalidone 25 MG tablet Commonly known as: HYGROTON       TAKE these medications    acetaminophen 500 MG tablet Commonly known as: TYLENOL Take 2 tablets (1,000 mg total) by mouth every 8 (eight) hours as needed for moderate pain or headache. What changed: when to take this   aspirin 81 MG EC tablet Take 1 tablet (81 mg total) by mouth daily.   blood glucose meter kit and supplies per insurance preference. Check cbg once a day. Dx E11.9   diclofenac Sodium 1 % Gel Commonly known as: VOLTAREN Apply 4 g topically 4 (four) times daily as needed.   furosemide 40 MG tablet Commonly known as: Lasix Take 1 tablet (40 mg total) by mouth daily.   irbesartan 75 MG tablet Commonly known as: AVAPRO Take 75 mg by mouth daily. Take one daily   metoprolol succinate 50 MG 24 hr tablet Commonly known as: Toprol XL Take 1 tablet (50 mg total) by mouth daily. Take with or immediately following a meal.   NIFEdipine 60 MG 24 hr tablet Commonly known as: PROCARDIA XL/NIFEDICAL XL Take 60 mg by mouth daily.   OXYGEN Inhale into the lungs.   rosuvastatin 10 MG tablet Commonly known as: CRESTOR Take 1 tablet (10 mg total) by mouth daily.   sitaGLIPtin 25 MG tablet Commonly known as: Januvia Take 1 tablet (25 mg total) by mouth daily.         Disposition and follow-up:   Ms.Tangie D Brunelle was discharged from [facility] in Stable condition.  At the hospital follow up visit please address:  Hypertension: Patient presented with hypertensive urgency and was treated with nitro drip. She stated she ran out of her blood pressure medications and her PCP office closed. Her BP improved and her home meds were restarted. She was discharged with close follow up in the [facility]. Adjustments will need to be made in her blood pressure as she is on multiple agents but none have reached max dosage. I instructed her to bring 2 bp readings per day to the clinic so we can adjust her meds accordingly. I believe she can be well controlled on 2 agents.  Diabetes: Patient has diabetes and has been sporadically taking her medications due to side effects. Her A1c was 8.0 and she was on sliding scale insulin while inpatient. At discharge she was started on Januvia 25 mg daily with adjustments at the follow up appointment. I instructed her to bring 2 readings with her to the clinic one being fasting so we can adjust her regimen.  CKD stage IIIB: Patient has baseline CKD and developed a creatinine bump suggestive of an AKI. She was still volume overloaded so creatinine bump could be due to decrease in blood pressure vs starting her ARB  the day before. Will recommend following up on this on 09/01 in the clinic.  Chronic Management: Patient has HLD, and HFpEF. She is on a statin. An echo was performed due volume overload but was unchanged. Lasix 40 mg until clinic visit was recommended due to physical exam findings. Assess fluid status on clinic visit.   2.  Labs / imaging needed at time of follow-up: bmp  3.  Pending labs/ test needing follow-up: none  Follow-up Appointments:  Follow-up Information     [place]. Go in 2 day(s).   Why: You have an appointment with Dr. Gilford Rile on 07/22/21 at  10:45am. Contact information: [address] [place] Hospital Course by date: 07/17/21-07/18/21: 70 year old female with past medical history of hypertension, heart failure with preserved ejection fraction, diabetes mellitus type 2, CKD stage IIIb who presents for evaluation of headache and high blood pressure.  She was previously a patient at [facility] clinic but had some trouble in transferring her care after they closed.  She is recently been out of her blood pressure medications and her blood pressure is markedly elevated on her presentation.  Work-up in the ED included an EKG which is normal sinus rhythm with LVH, chest x-ray with pulmonary vascular congestion, elevated BNP, and negative vascular Dopplers for DVT.  She was started on IV Lasix for diuresis and nitroglycerin for her blood pressure.  Blood pressure is down this morning and she feels much better shortness of breath has resolved completely and a mild headache is also resolved. Echo repeated and was unchanged from previous study. All home bp meds restarted.  07/19/21: No acute events overnight. Patient blood pressure significantly lower than admission. Had a mild headache and joint pain but improved with tylenol. Patient deemed stable for discharge with close follow up in the clinic. Lasix 40 mg oral added until clinic visit. Januvia 25 mg daily also added for diabetes. Patient discharged.   Discharge Subjective:  Patient complained of headache and dizziness. Denied chest pain, shortness of breath. Denied any other acute complaints. Headache better after tylenol.   Discharge Exam:   BP (!) 166/83 (BP Location: Right Arm)   Pulse 72   Temp 98.6 F (37 C) (Oral)   Resp 20   Ht 5' 7"  (1.702 m)   Wt 121.9 kg   SpO2 96%   BMI 42.09 kg/m  Discharge exam:  General: Well appearing, pleasant, NAD, obese female CV: regular rate/rhythm, JVD present much improved from yesterday,  2+ pulses Lungs: Bibasilar crackles present improved from yesterday MSK: minimal pitting edema    Pertinent Labs, Studies, and Procedures:  CBC Latest Ref Rng & Units 07/19/2021 07/18/2021 07/17/2021  WBC 4.0 - 10.5 K/uL 6.6 6.7 5.7  Hemoglobin 12.0 - 15.0 g/dL 12.3 11.8(L) 12.5  Hematocrit 36.0 - 46.0 % 37.5 36.1 38.7  Platelets 150 - 400 K/uL 252 247 252    CMP Latest Ref Rng & Units 07/19/2021 07/18/2021 07/17/2021  Glucose 70 - 99 mg/dL 185(H) 219(H) 168(H)  BUN 8 - 23 mg/dL 41(H) 31(H) 31(H)  Creatinine 0.44 - 1.00 mg/dL 2.37(H) 1.90(H) 1.82(H)  Sodium 135 - 145 mmol/L 139 138 142  Potassium 3.5 - 5.1 mmol/L 3.1(L) 3.5 3.5  Chloride 98 - 111 mmol/L 99 102 105  CO2 22 - 32 mmol/L 29 28 30   Calcium 8.9 - 10.3 mg/dL 8.7(L) 8.4(L) 8.8(L)  Total Protein 6.5 - 8.1 g/dL - 6.1(L) 6.6  Total Bilirubin 0.3 - 1.2 mg/dL - 0.9 0.7  Alkaline Phos 38 - 126 U/L - 77 80  AST 15 - 41 U/L - 15 12(L)  ALT 0 - 44 U/L - 11 12    DG Chest 2 View  Result Date: 07/17/2021 CLINICAL DATA:  sob EXAM: CHEST - 2 VIEW COMPARISON:  March 13, 2020 FINDINGS: The cardiomediastinal silhouette is unchanged and enlarged in contour.Trace fluid in the RIGHT minor fissure. No pleural effusion. No pneumothorax. Perihilar vascular fullness. No acute pleuroparenchymal abnormality. Visualized abdomen is unremarkable. Advanced degenerative changes of the RIGHT shoulder. IMPRESSION: Perihilar vascular fullness without overt edema. Electronically Signed   By: Valentino Saxon M.D.   On: 07/17/2021 17:33   CT Head Wo Contrast  Result Date: 07/17/2021 CLINICAL DATA:  Headache.  Hypertension. EXAM: CT HEAD WITHOUT CONTRAST  FINDINGS: Brain: Mild chronic ischemic white matter disease is noted. Old right cerebellar infarction is noted. No mass effect or midline shift is noted. Ventricular size is within normal limits. There is no evidence of mass lesion, hemorrhage or acute infarction. Vascular: No hyperdense vessel or unexpected  calcification. Skull: Normal. Negative for fracture or focal lesion. Sinuses/Orbits: No acute finding. Other: None. IMPRESSION: No acute intracranial abnormality seen. Electronically Signed   By: Marijo Conception M.D.   On: 07/17/2021 16:26   ECHOCARDIOGRAM COMPLETE  Result Date: 07/18/2021    ECHOCARDIOGRAM REPORT   Patient Name:   SHANIK BROOKSHIRE Date of Exam: 07/18/2021 Medical Rec #:  [id number]            Height:       67.0 in Accession #:    [id number]           Weight:       253.0 lb Date of Birth:  [birth year]            BSA:          2.235 m Patient Age:    16 years             BP:           166/103 mmHg Patient Gender: F                    HR:           88 bpm. Exam Location:  Inpatient Procedure: 2D Echo, Color Doppler and Cardiac Doppler Indications:    Congestive Heart Failure I50.9  History:        Patient has prior history of Echocardiogram examinations, most                 recent 02/28/2020. Stroke, Signs/Symptoms:Shortness of Breath;                 Risk Factors:Diabetes, Dyslipidemia and Hypertension.  Sonographer:    Bernadene Person RDCS Referring Phys: [place]  1. Left ventricular ejection fraction, by estimation, is 55 to 60%. The left ventricle has normal function. The left ventricle has no regional wall motion abnormalities. There is moderate concentric left ventricular hypertrophy. Indeterminate diastolic filling due to E-A fusion.  2. Right ventricular systolic function is normal. The right ventricular size is normal. Tricuspid regurgitation signal is inadequate for assessing PA pressure.  3. The mitral valve is normal in structure. Trivial mitral valve regurgitation. No evidence of mitral stenosis.  4. The aortic valve was not well visualized. There is moderate calcification of  the aortic valve. Aortic valve regurgitation is mild to moderate. Mild to moderate aortic valve sclerosis/calcification is present, without any evidence of aortic stenosis. Comparison(s):  No significant change from prior study.  VAS [place] LOWER EXTREMITY VENOUS (DVT) ([facility] and [facility] 7a-7p)  Result Date: 07/18/2021  Lower Venous DVT Study Patient Name:  SHAMERIA TRIMARCO  Date of Exam:   07/17/2021 Medical Rec #: [id number]             Accession #:    [id number] Date of Birth: 1951-08-03             Patient Gender: F Patient Age:   54 years Exam Location[facility] Procedure:      VAS [place] LOWER EXTREMITY VENOUS (DVT)  Summary: RIGHT: - No evidence of common femoral vein obstruction.  LEFT: - There is no evidence of deep vein thrombosis in the lower extremity. However, portions of this examination were limited- see technologist comments above.  *See table(s) above for measurements and observations. Electronically signed by Monica Martinez MD on 07/18/2021 at 2:26:47 PM.    Final      Discharge Instructions: Discharge Instructions     Call MD for:  difficulty breathing, headache or visual disturbances   Complete by: As directed    Call MD for:  extreme fatigue   Complete by: As directed    Call MD for:  hives   Complete by: As directed    Call MD for:  persistant dizziness or light-headedness   Complete by: As directed    Call MD for:  persistant nausea and vomiting   Complete by: As directed    Call MD for:  redness, tenderness, or signs of infection (pain, swelling, redness, odor or green/yellow discharge around incision site)   Complete by: As directed    Call MD for:  severe uncontrolled pain   Complete by: As directed    Call MD for:  temperature >100.4   Complete by: As directed    Discharge instructions   Complete by: As directed    It was a pleasure meeting you today! You came here with elevated blood pressure and were found to have more fluid in your body than usual. We gave you medicine to decrease this fluid and a medicine to decrease your blood pressure. You did not take your home medicines for a while so we restarted them while you were in hospital. We would  like you to to continue your medicines and follow up with [place] in the clinic. We are starting a fluid pill to take everyday. Take this everyday until you see [place] in the clinic. You are also started on a diabetic medicine. Please take this everyday until you follow up with [place] in the clinic. Check your blood sugars twice daily once in the morning and once before dinner until you see [place] in the clinic. We will make any adjustments needed during your follow up.   Increase activity slowly   Complete by: As directed        Signed: [name], MD 07/19/2021, 3:08 PM   Pager: [phone]

## 2021-07-20 ENCOUNTER — Encounter: Payer: Self-pay | Admitting: Internal Medicine

## 2021-07-20 NOTE — Telephone Encounter (Signed)
Hi Dr. Gilford Rile, Yes, she was asking for a refill on the Nifedipine.  She is not one of our primary patients, we only saw her when she was an inpatient which is why I didn't send it to the red team.  I offered her an appt tomorrow morning at 0845 to discuss refill/need for the med, but she declined. If any of you authorize the refill before she is seen in clinic on Thursday, she wants it to go to [facility] on [address]. Thank you! Gavon Majano

## 2021-07-20 NOTE — Telephone Encounter (Signed)
Transition Care Management Follow-up Telephone Call Date of discharge and from where: 07/20/21 How have you been since you were released from the hospital? "I'm alright" Any questions or concerns? Yes, "My medicine.  My Nefedipine was not called in".  Items Reviewed: Did the pt receive and understand the discharge instructions provided? Yes  Medications obtained and verified? Yes  Other? No  Any new allergies since your discharge? No  Dietary orders reviewed? Yes Do you have support at home? No   Home Care and Equipment/Supplies: Were home health services ordered? no Functional Questionnaire: (I = Independent and D = Dependent) ADLs: I  Bathing/Dressing- I  Meal Prep- I  Eating- I  Maintaining continence- I  Transferring/Ambulation- I  Managing Meds- I  Follow up appointments reviewed:  PCP Hospital f/u appt confirmed? Yes  Scheduled to see Dr. Gilford Rile on 07/22/2021 @ 1045. [place] [facility] f/u appt confirmed? No  Are transportation arrangements needed? No  If their condition worsens, is the pt aware to call PCP or go to the Emergency Dept.? Yes Was the patient provided with contact information for the PCP's office or ED? Yes Was to pt encouraged to call back with questions or concerns? Yes

## 2021-07-20 NOTE — Telephone Encounter (Signed)
Is this a refill of her nifedipine she is wanting?

## 2021-07-21 MED ORDER — NIFEDIPINE ER OSMOTIC RELEASE 60 MG PO TB24: 60.0000 mg | ORAL_TABLET | Freq: Every day | ORAL | 0 refills | Status: DC

## 2021-07-22 ENCOUNTER — Ambulatory Visit (INDEPENDENT_AMBULATORY_CARE_PROVIDER_SITE_OTHER): Payer: Medicare HMO | Admitting: Internal Medicine

## 2021-07-22 ENCOUNTER — Encounter: Payer: Self-pay | Admitting: Internal Medicine

## 2021-07-22 ENCOUNTER — Telehealth: Payer: Self-pay | Admitting: *Deleted

## 2021-07-22 VITALS — BP 217/79 | HR 78 | Wt 270.6 lb

## 2021-07-22 DIAGNOSIS — E785 Hyperlipidemia, unspecified: Secondary | ICD-10-CM

## 2021-07-22 DIAGNOSIS — E1169 Type 2 diabetes mellitus with other specified complication: Secondary | ICD-10-CM

## 2021-07-22 DIAGNOSIS — E669 Obesity, unspecified: Secondary | ICD-10-CM | POA: Diagnosis not present

## 2021-07-22 DIAGNOSIS — I10 Essential (primary) hypertension: Secondary | ICD-10-CM

## 2021-07-22 DIAGNOSIS — N1831 Chronic kidney disease, stage 3a: Secondary | ICD-10-CM

## 2021-07-22 LAB — BASIC METABOLIC PANEL
Anion gap: 9 (ref 5–15)
BUN: 43 mg/dL — ABNORMAL HIGH (ref 8–23)
CO2: 29 mmol/L (ref 22–32)
Calcium: 8.3 mg/dL — ABNORMAL LOW (ref 8.9–10.3)
Chloride: 102 mmol/L (ref 98–111)
Creatinine, Ser: 2.22 mg/dL — ABNORMAL HIGH (ref 0.44–1.00)
GFR, Estimated: 23 mL/min — ABNORMAL LOW (ref >60)
Glucose, Bld: 166 mg/dL — ABNORMAL HIGH (ref 70–99)
Potassium: 3.3 mmol/L — ABNORMAL LOW (ref 3.5–5.1)
Sodium: 140 mmol/L (ref 135–145)

## 2021-07-22 LAB — GLUCOSE, CAPILLARY: Glucose-Capillary: 163 mg/dL — ABNORMAL HIGH (ref 70–99)

## 2021-07-22 MED ORDER — METOPROLOL SUCCINATE ER 50 MG PO TB24: 50.0000 mg | ORAL_TABLET | Freq: Every day | ORAL | 2 refills | Status: DC

## 2021-07-22 MED ORDER — DICLOFENAC SODIUM 1 % EX GEL: 4.0000 g | Freq: Four times a day (QID) | CUTANEOUS | 0 refills | Status: AC | PRN

## 2021-07-22 MED ORDER — IRBESARTAN 75 MG PO TABS: 75.0000 mg | ORAL_TABLET | Freq: Every day | ORAL | 2 refills | Status: DC

## 2021-07-22 MED ORDER — ROSUVASTATIN CALCIUM 10 MG PO TABS: 10.0000 mg | ORAL_TABLET | Freq: Every day | ORAL | 3 refills | Status: DC

## 2021-07-22 NOTE — Chronic Care Management (AMB) (Signed)
  Care Management   Note  07/22/2021 Name: [name] MRN: [id number] DOB: [birth year]  Alicia [name] is a 70 y.o. year old female who is a primary care patient of Maudie Mercury, MD. I reached out to Arta Bruce by phone today in response to a referral sent by Alicia [name] PCP, Dr. Gilford Rile.    Alicia [name] was given information about care management services today including:  Care management services include personalized support from designated clinical staff supervised by her physician, including individualized plan of care and coordination with other care providers 24/7 contact phone numbers for assistance for urgent and routine care needs. The patient may stop care management services at any time by phone call to the office staff.  Patient agreed to services and verbal consent obtained.   Follow up plan: Telephone appointment with care management team member scheduled for:07/28/21  [facility]  Direct Dial: [phone]

## 2021-07-22 NOTE — Progress Notes (Signed)
   CC: HFU  HPI:  Ms.Alesi D Kujala is a 70 y.o. person, with a PMH noted below, who presents to the clinic for a hospital follow up. To see the management of their acute and chronic conditions, please see the A&P note under the Encounters tab.   Past Medical History:  Diagnosis Date   Allergy    Arthritis    Chronic headaches    Constipation    Depression    Diabetes mellitus    Hyperlipidemia    Hypertension    Obesity    PAD (peripheral artery disease) (HCC)    Shortness of breath    Stage 3 chronic kidney disease ([place])    Stroke ([place])    Review of Systems:   Review of Systems  Constitutional:  Negative for chills, diaphoresis, fever, malaise/fatigue and weight loss.  Eyes:  Positive for blurred vision. Negative for double vision, photophobia and pain.       Endorses chronic blurry vision  Respiratory:  Negative for cough, shortness of breath and wheezing.   Cardiovascular:  Negative for chest pain, palpitations and orthopnea.  Gastrointestinal:  Negative for abdominal pain, nausea and vomiting.    Physical Exam:  Vitals:   07/22/21 1108  BP: (!) 217/79  Pulse: 78  SpO2: 100%  Weight: 270 lb 9.6 oz (122.7 kg)   Physical Exam Constitutional:      General: She is not in acute distress.    Appearance: She is not ill-appearing, toxic-appearing or diaphoretic.     Comments: Sitting comfortably in WC, NAD, answering questions appropriately.   Cardiovascular:     Rate and Rhythm: Normal rate and regular rhythm.     Heart sounds: Normal heart sounds. No murmur heard.   No gallop.  Pulmonary:     Effort: Pulmonary effort is normal.     Breath sounds: Normal breath sounds. No wheezing, rhonchi or rales.  Abdominal:     General: Abdomen is flat. Bowel sounds are normal. There is no distension.     Palpations: Abdomen is soft.     Tenderness: There is no abdominal tenderness.  Musculoskeletal:     Comments: 1+ Pitting edema in the LLE Trace pitting edema in the  RLE  Neurological:     Mental Status: She is alert and oriented to person, place, and time.  Psychiatric:        Mood and Affect: Mood normal.        Behavior: Behavior normal.     Assessment & Plan:   See Encounters Tab for problem based charting.  Patient discussed with Dr. Daryll Drown

## 2021-07-22 NOTE — Assessment & Plan Note (Addendum)
Patient presenting for hospital follow up for hypertensive urgency 2/2 to running out of her home Irbesartan and and nifedipine. In summary, she was started on a nitro drip with resumption of her home medications and improvement in her blood pressures.   She states that she did not receive her nifedipine after her hospital discharge. She states that since her hospital stay she has felt fatigued. She endorses chronic headaches that have not changed in character, chronic blurred vision. She denies chest pain, dyspnea, or abdominal pain. Her pressure today is 217/79 with a pulse of 78. Will refill her nifedipine today and follow up with patient early next week.   Additionally, patient is accompanied by her daughter from [place], who further explains that since her mother lives alone at home, she does have difficulty with being compliant with her medications. She also has transportation issues to make it to her appointments.  - Follow up on  07/27/21 with Dr. Humphrey Rolls (new PCP) - Ordered Nifedipine 60 mg daily  -Reordered irbesartan 25 mg daily - Vital check at next visit.  - Reach out to CCM for assistance with transport and medication compliance.

## 2021-07-22 NOTE — Patient Instructions (Addendum)
Ms. Jonas,   It was a pleasure seeing you today. Today we discussed your blood pressure medications diabetes, and joint pain.   For your blood pressure medications, I have reordered for your nifedipine and irbesartan, please continue to take these medications. If you notice chest pain, dizziness, increased blurry vision, or increased shortness of breath please call the clinic for evaluation or go to the nearest ED. I will also put in a social work referral for home health services.   For your joint pain, I will order Voltaren gel for your shoulder. We will reassess your kidney function in order to see if we can order other medications.   For your diabetes, please continue taking your Januvia.   We will see you in 5 days time.  Maudie Mercury, MD

## 2021-07-22 NOTE — Assessment & Plan Note (Signed)
Patient with CKD stage III presents to the clinic for hospital follow up. During her inpatient stay, patient's creatinine of 1.8  Increased to 2.37. Will recheck today.  - Repeat BMP.  - Avoid nephrotoxic agents

## 2021-07-23 ENCOUNTER — Encounter: Payer: Self-pay | Admitting: Internal Medicine

## 2021-07-23 NOTE — Assessment & Plan Note (Signed)
Patient with history of hyperlipidemia, currently taking Crestor 10 mg daily.  Last lipid panel on 07/19/2021 was within normal limits. - Reordered Crestor - Follow-up lipid panel in a year

## 2021-07-27 NOTE — Progress Notes (Signed)
Internal Medicine Clinic Attending ? ?Case discussed with Dr. Winters  At the time of the visit.  We reviewed the resident?s history and exam and pertinent patient test results.  I agree with the assessment, diagnosis, and plan of care documented in the resident?s note.  ?

## 2021-07-28 ENCOUNTER — Ambulatory Visit: Payer: Medicare HMO

## 2021-07-28 NOTE — Addendum Note (Signed)
Addended by: Maudie Mercury C on: 07/28/2021 08:50 AM   Modules accepted: Orders

## 2021-07-28 NOTE — Patient Instructions (Signed)
Visit Information   Goals Addressed             This Visit's Progress    Monitor and Manage My Blood Sugar-Diabetes Type 2   On track    Timeframe:  Long-Range Goal Priority:  High Start Date:                             Expected End Date:                       Follow Up Date 09/19/21   - check blood sugar at prescribed times - check blood sugar if I feel it is too high or too low - enter blood sugar readings and medication or insulin into daily log - take the blood sugar meter to all doctor visits    Why is this important?   Checking your child's/your blood sugar at home helps to keep it from getting very high or very low.  Writing the results in a diary or log helps the doctor know how to care for your child/you.  The blood sugar log should have the time, date and the results.  Also, write down the amount of insulin or other medicine your child/you take.  Other information, like what your child/you ate, exercise done and how your child/you were feeling, will also be helpful.     Notes:      Track and Manage My Blood Pressure-Hypertension   On track    Timeframe:  Long-Range Goal Priority:  High Start Date:                             Expected End Date:                       Follow Up Date 09/19/21   - check blood pressure 3 times per week - write blood pressure results in a log or diary    Why is this important?   You won't feel high blood pressure, but it can still hurt your blood vessels.  High blood pressure can cause heart or kidney problems. It can also cause a stroke.  Making lifestyle changes like losing a little weight or eating less salt will help.  Checking your blood pressure at home and at different times of the day can help to control blood pressure.  If the doctor prescribes medicine remember to take it the way the doctor ordered.  Call the office if you cannot afford the medicine or if there are questions about it.     Notes:         The  patient verbalized understanding of instructions, educational materials, and care plan provided today and declined offer to receive copy of patient instructions, educational materials, and care plan.   Telephone follow up appointment with care management team member scheduled for:08/25/21  Johnney Killian, RN, BSN, CCM Care Management Coordinator [facility] Internal Medicine Phone: [phone] / Fax: [phone]

## 2021-07-28 NOTE — Chronic Care Management (AMB) (Signed)
Care Management    RN Visit Note  07/28/2021 Name: Alicia [name] MRN: [id number] DOB: 17-Mar-1951  Subjective: VERNESTINE BRODHEAD is a 70 y.o. year old female who is a primary care patient of Sid Falcon MD . The care management team was consulted for assistance with disease management and care coordination needs.    Engaged with patient by telephone for initial visit in response to provider referral for case management and/or care coordination services.   Consent to Services:   Ms. Labree was given information about Care Management services today including:  Care Management services includes personalized support from designated clinical staff supervised by her physician, including individualized plan of care and coordination with other care providers 24/7 contact phone numbers for assistance for urgent and routine care needs. The patient may stop case management services at any time by phone call to the office staff.  Patient agreed to services and consent obtained.   Assessment: Review of patient past medical history, allergies, medications, health status, including review of consultants reports, laboratory and other test data, was performed as part of comprehensive evaluation and provision of chronic care management services.   SDOH (Social Determinants of Health) assessments and interventions performed:  SDOH Interventions    Flowsheet Row Most Recent Value  SDOH Interventions   Food Insecurity Interventions Intervention Not Indicated  Physical Activity Interventions Intervention Not Indicated  Transportation Interventions Other (Comment)        Care Plan  Allergies  Allergen Reactions   Atorvastatin Swelling    Lip swelling   Nifedipine Er Other (See Comments)    Caused nose bleeds in higher doses (90 mg., namely)   Latex Rash    Outpatient Encounter Medications as of 07/28/2021  Medication Sig Note   acetaminophen (TYLENOL) 500 MG tablet Take 2 tablets  (1,000 mg total) by mouth every 8 (eight) hours as needed for moderate pain or headache.    aspirin 81 MG EC tablet Take 1 tablet (81 mg total) by mouth daily.    blood glucose meter kit and supplies per insurance preference. Check cbg once a day. Dx E11.9    diclofenac Sodium (VOLTAREN) 1 % GEL Apply 4 g topically 4 (four) times daily as needed.    furosemide (LASIX) 40 MG tablet Take 1 tablet (40 mg total) by mouth daily.    irbesartan (AVAPRO) 75 MG tablet Take 1 tablet (75 mg total) by mouth daily. Take one daily    metoprolol succinate (TOPROL XL) 50 MG 24 hr tablet Take 1 tablet (50 mg total) by mouth daily. Take with or immediately following a meal.    NIFEdipine (PROCARDIA XL/NIFEDICAL XL) 60 MG 24 hr tablet Take 1 tablet (60 mg total) by mouth daily.    OXYGEN Inhale into the lungs. 06/15/2020: 2 L PRN   rosuvastatin (CRESTOR) 10 MG tablet Take 1 tablet (10 mg total) by mouth daily.    sitaGLIPtin (JANUVIA) 25 MG tablet Take 1 tablet (25 mg total) by mouth daily.    No facility-administered encounter medications on file as of 07/28/2021.    Patient Active Problem List   Diagnosis Date Noted   CHF exacerbation ([place]) 07/18/2021   LVH (left ventricular hypertrophy) due to hypertensive disease, with heart failure ([place]) 07/18/2021   Exacerbation of RAD (reactive airway disease) 02/27/2020   Hypertensive urgency 02/27/2020   CKD (chronic kidney disease) stage 3, GFR 30-59 ml/min ([place]) 02/27/2020   Tobacco use 02/27/2020   Hypertensive emergency 02/27/2020  Prolonged Q-T interval on ECG 04/27/2018   CHF (congestive heart failure) ([place]) 04/27/2018   Weakness 04/03/2018   Syncope 04/03/2018   Renal insufficiency 03/16/2018   Diabetes mellitus type 2 in obese ([place]) 03/16/2018   Morbid obesity ([place])    CVA (cerebral vascular accident) ([place]) 03/15/2018   Numbness in both hands- and Feet 06/06/2014   Weakness of both legs 06/06/2014   Swelling of limb-Left Leg > Right 06/06/2014    Peripheral vascular disease, unspecified ([place]) 11/30/2012   Pain in limb 11/30/2012   Atherosclerosis of native arteries of the extremities with intermittent claudication 04/06/2012   CHEST PAIN 01/03/2011   Hyperlipidemia 07/20/2007   Essential hypertension, benign 07/20/2007   Arthropathy 07/20/2007    Conditions to be addressed/monitored: HTN, DMII, and CKD Stage 3  Care Plan : Diabetes Type 2 (Adult)  Updates made by Johnney Killian, RN since 07/28/2021 12:00 AM     Problem: Disease Progression (Diabetes, Type 2)      Goal: Disease Progression Prevented or Minimized   Start Date: 07/28/2021  Priority: High  Note:      SDOH (Social Determinants of Health) assessments performed: Yes See Care Plan activities for detailed interventions related to [place])  SDOH Interventions    Flowsheet Row Most Recent Value  SDOH Interventions   Food Insecurity Interventions Intervention Not Indicated  Physical Activity Interventions Intervention Not Indicated  Transportation Interventions Other (Comment)- Referred to Care Coordination        Outpatient Encounter Medications as of 07/28/2021  Medication Sig Note   acetaminophen (TYLENOL) 500 MG tablet Take 2 tablets (1,000 mg total) by mouth every 8 (eight) hours as needed for moderate pain or headache.    aspirin 81 MG EC tablet Take 1 tablet (81 mg total) by mouth daily.    blood glucose meter kit and supplies per insurance preference. Check cbg once a day. Dx E11.9    diclofenac Sodium (VOLTAREN) 1 % GEL Apply 4 g topically 4 (four) times daily as needed.    furosemide (LASIX) 40 MG tablet Take 1 tablet (40 mg total) by mouth daily.    irbesartan (AVAPRO) 75 MG tablet Take 1 tablet (75 mg total) by mouth daily. Take one daily    metoprolol succinate (TOPROL XL) 50 MG 24 hr tablet Take 1 tablet (50 mg total) by mouth daily. Take with or immediately following a meal.    NIFEdipine (PROCARDIA XL/NIFEDICAL XL) 60 MG 24 hr tablet Take 1 tablet  (60 mg total) by mouth daily.    OXYGEN Inhale into the lungs. 06/15/2020: 2 L PRN   rosuvastatin (CRESTOR) 10 MG tablet Take 1 tablet (10 mg total) by mouth daily.    sitaGLIPtin (JANUVIA) 25 MG tablet Take 1 tablet (25 mg total) by mouth daily.    No facility-administered encounter medications on file as of 07/28/2021.       Care Plan : Hypertension (Adult)  Updates made by Johnney Killian, RN since 07/28/2021 12:00 AM     Problem: Hypertension (Hypertension)      Goal: Hypertension Monitored   Note:   Evidence-based guidance: Successful outreach to patient for care coordination.  Patient takes her blood pressure at home multiple times per week.  Encouraged patient to write down her blood pressure and if she gets an odd reading, she should check it a second time.  Patient also checks her CBG readings, but she does not write them down.  Educated on the importance of medication management for both her  diabetes and her Hypertension.  Patient also has an order for home health nursing to work with patient on her medication management.  Call placed to [facility], spoke with [facility] who stated they have had patient in 2021 for services.  Chelsea looked at [facility] chart including orders and stated they would pick the patient up and will be calling her to schedule the initial visit. Patient lives alone and has a car but she has been too ill to drive.  Most of her family live in [place], the [place] and she does have family in [place].  Patient stated she does not have any food insecurity. Encourage continued use of home blood pressure monitoring and recording in blood pressure log; include symptoms of hypotension or potential medication side effects in log.  Review blood pressure measurements taken inside and outside of the provider office; establish baseline and monitor trends; compare to target ranges or patient goal.  Share overall cardiovascular risk with patient; encourage changes to lifestyle  risk factors, including alcohol consumption, smoking, inadequate exercise, poor dietary habits and stress.   Notes:      Care Plan : Obesity (Adult)  Updates made by Johnney Killian, RN since 07/28/2021 12:00 AM     Problem: Comorbidities (Obesity)      Goal: Prevent or Manage Comorbidities   Note:   Evidence-based guidance:  Assess risk for or presence of comorbid condition.  Prepare patient for laboratory tests and diagnostic studies, such as fasting blood glucose, hemoglobin A1C, lipid panel, metabolic panel, polysomnography, joint x-ray, magnetic resonance imaging.   Identify and support self-management plan for chronic comorbidities; identify barriers to disease management and brainstorm strategies for success.  Support and encourage lifestyle changes and weight loss efforts as fundamental to reducing risk or severity of comorbidities.  Monitor for signs of anxiety or depression; provide or refer for mental health services when needed.  Prepare patient for use of pharmacologic therapy as treatment for comorbidity; monitor efficacy and manage side effects.   Notes:       Plan: Telephone follow up appointment with care management team member scheduled for:  64 days  Johnney Killian, RN, BSN, CCM Care Management Coordinator [facility] Internal Medicine Phone: [phone] / Fax: [phone]

## 2021-07-30 ENCOUNTER — Telehealth: Payer: Self-pay | Admitting: *Deleted

## 2021-07-30 DIAGNOSIS — H538 Other visual disturbances: Secondary | ICD-10-CM | POA: Diagnosis not present

## 2021-07-30 DIAGNOSIS — F32A Depression, unspecified: Secondary | ICD-10-CM | POA: Diagnosis not present

## 2021-07-30 DIAGNOSIS — E669 Obesity, unspecified: Secondary | ICD-10-CM | POA: Diagnosis not present

## 2021-07-30 DIAGNOSIS — E1169 Type 2 diabetes mellitus with other specified complication: Secondary | ICD-10-CM | POA: Diagnosis not present

## 2021-07-30 DIAGNOSIS — Z6841 Body Mass Index (BMI) 40.0 and over, adult: Secondary | ICD-10-CM | POA: Diagnosis not present

## 2021-07-30 DIAGNOSIS — N183 Chronic kidney disease, stage 3 unspecified: Secondary | ICD-10-CM | POA: Diagnosis not present

## 2021-07-30 DIAGNOSIS — E1122 Type 2 diabetes mellitus with diabetic chronic kidney disease: Secondary | ICD-10-CM | POA: Diagnosis not present

## 2021-07-30 DIAGNOSIS — I129 Hypertensive chronic kidney disease with stage 1 through stage 4 chronic kidney disease, or unspecified chronic kidney disease: Secondary | ICD-10-CM | POA: Diagnosis not present

## 2021-07-30 DIAGNOSIS — E1151 Type 2 diabetes mellitus with diabetic peripheral angiopathy without gangrene: Secondary | ICD-10-CM | POA: Diagnosis not present

## 2021-07-30 NOTE — Telephone Encounter (Signed)
[place] office stated Dr Gilford Rile since he saw on 07/22/21. She was discharged form the hospital 07/19/21.

## 2021-07-30 NOTE — Telephone Encounter (Signed)
I called pt - no answer. No vm, unable to lave a message. I called [name] , [facility] - no answer; I did not leave a message. I will call pt's daughter listed on her chart. I called pt's daughter,Alicia [name]. Informed I called her mother but no answer and I was calling to schedule an appt to f/u BP/BS and informed she had a visit by [facility] and they were elevated.  She stated pt does transportation issues. And she will have her mother call the office back. Also asked her to have her mother to keep a record of her BS and BP. Informed office is open today until 1530PM; if no answer, call Monday am. Stated she understands.

## 2021-07-30 NOTE — Telephone Encounter (Signed)
Call from [facility], [place] stated-  BP 190 / 110; pt is asymptomatic; she took all her meds. Review med list with her. BS - 297 which is high for her. Need verbal order for social worker for transportation options. Requesting 90 day supply for Nifedipine; stated she has a cardiology appt in October.

## 2021-08-03 DIAGNOSIS — E1151 Type 2 diabetes mellitus with diabetic peripheral angiopathy without gangrene: Secondary | ICD-10-CM | POA: Diagnosis not present

## 2021-08-03 DIAGNOSIS — E1169 Type 2 diabetes mellitus with other specified complication: Secondary | ICD-10-CM | POA: Diagnosis not present

## 2021-08-03 DIAGNOSIS — H538 Other visual disturbances: Secondary | ICD-10-CM | POA: Diagnosis not present

## 2021-08-03 DIAGNOSIS — I129 Hypertensive chronic kidney disease with stage 1 through stage 4 chronic kidney disease, or unspecified chronic kidney disease: Secondary | ICD-10-CM | POA: Diagnosis not present

## 2021-08-03 DIAGNOSIS — F32A Depression, unspecified: Secondary | ICD-10-CM | POA: Diagnosis not present

## 2021-08-03 DIAGNOSIS — I10 Essential (primary) hypertension: Secondary | ICD-10-CM | POA: Diagnosis not present

## 2021-08-03 DIAGNOSIS — E1122 Type 2 diabetes mellitus with diabetic chronic kidney disease: Secondary | ICD-10-CM | POA: Diagnosis not present

## 2021-08-03 DIAGNOSIS — N183 Chronic kidney disease, stage 3 unspecified: Secondary | ICD-10-CM | POA: Diagnosis not present

## 2021-08-03 DIAGNOSIS — E669 Obesity, unspecified: Secondary | ICD-10-CM | POA: Diagnosis not present

## 2021-08-03 DIAGNOSIS — I639 Cerebral infarction, unspecified: Secondary | ICD-10-CM | POA: Diagnosis not present

## 2021-08-03 DIAGNOSIS — I509 Heart failure, unspecified: Secondary | ICD-10-CM | POA: Diagnosis not present

## 2021-08-03 DIAGNOSIS — Z6841 Body Mass Index (BMI) 40.0 and over, adult: Secondary | ICD-10-CM | POA: Diagnosis not present

## 2021-08-05 DIAGNOSIS — E1151 Type 2 diabetes mellitus with diabetic peripheral angiopathy without gangrene: Secondary | ICD-10-CM | POA: Diagnosis not present

## 2021-08-05 DIAGNOSIS — I129 Hypertensive chronic kidney disease with stage 1 through stage 4 chronic kidney disease, or unspecified chronic kidney disease: Secondary | ICD-10-CM | POA: Diagnosis not present

## 2021-08-05 DIAGNOSIS — Z6841 Body Mass Index (BMI) 40.0 and over, adult: Secondary | ICD-10-CM | POA: Diagnosis not present

## 2021-08-05 DIAGNOSIS — N183 Chronic kidney disease, stage 3 unspecified: Secondary | ICD-10-CM | POA: Diagnosis not present

## 2021-08-05 DIAGNOSIS — E669 Obesity, unspecified: Secondary | ICD-10-CM | POA: Diagnosis not present

## 2021-08-05 DIAGNOSIS — H538 Other visual disturbances: Secondary | ICD-10-CM | POA: Diagnosis not present

## 2021-08-05 DIAGNOSIS — E1122 Type 2 diabetes mellitus with diabetic chronic kidney disease: Secondary | ICD-10-CM | POA: Diagnosis not present

## 2021-08-05 DIAGNOSIS — F32A Depression, unspecified: Secondary | ICD-10-CM | POA: Diagnosis not present

## 2021-08-05 DIAGNOSIS — E1169 Type 2 diabetes mellitus with other specified complication: Secondary | ICD-10-CM | POA: Diagnosis not present

## 2021-08-11 DIAGNOSIS — Z6841 Body Mass Index (BMI) 40.0 and over, adult: Secondary | ICD-10-CM | POA: Diagnosis not present

## 2021-08-11 DIAGNOSIS — I129 Hypertensive chronic kidney disease with stage 1 through stage 4 chronic kidney disease, or unspecified chronic kidney disease: Secondary | ICD-10-CM | POA: Diagnosis not present

## 2021-08-11 DIAGNOSIS — H538 Other visual disturbances: Secondary | ICD-10-CM | POA: Diagnosis not present

## 2021-08-11 DIAGNOSIS — E1169 Type 2 diabetes mellitus with other specified complication: Secondary | ICD-10-CM | POA: Diagnosis not present

## 2021-08-11 DIAGNOSIS — F32A Depression, unspecified: Secondary | ICD-10-CM | POA: Diagnosis not present

## 2021-08-11 DIAGNOSIS — E669 Obesity, unspecified: Secondary | ICD-10-CM | POA: Diagnosis not present

## 2021-08-11 DIAGNOSIS — N183 Chronic kidney disease, stage 3 unspecified: Secondary | ICD-10-CM | POA: Diagnosis not present

## 2021-08-11 DIAGNOSIS — E1151 Type 2 diabetes mellitus with diabetic peripheral angiopathy without gangrene: Secondary | ICD-10-CM | POA: Diagnosis not present

## 2021-08-11 DIAGNOSIS — E1122 Type 2 diabetes mellitus with diabetic chronic kidney disease: Secondary | ICD-10-CM | POA: Diagnosis not present

## 2021-08-12 ENCOUNTER — Telehealth: Payer: Self-pay | Admitting: Family Medicine

## 2021-08-12 NOTE — Telephone Encounter (Signed)
   Telephone encounter was:  Unsuccessful.  08/12/2021 Name: Alicia [name] MRN: [id number] DOB: [birth year]  Unsuccessful outbound call made today to assist with:  Transportation Needs   Outreach Attempt:  1st Attempt  Phone just rang, no voice mail. Will call her back.  April Green Care Guide, Embedded Care Coordination [facility] [place], Care Management Phone: [phone] Email: [email or web address]

## 2021-08-13 DIAGNOSIS — H538 Other visual disturbances: Secondary | ICD-10-CM | POA: Diagnosis not present

## 2021-08-13 DIAGNOSIS — E669 Obesity, unspecified: Secondary | ICD-10-CM | POA: Diagnosis not present

## 2021-08-13 DIAGNOSIS — E1169 Type 2 diabetes mellitus with other specified complication: Secondary | ICD-10-CM | POA: Diagnosis not present

## 2021-08-13 DIAGNOSIS — F32A Depression, unspecified: Secondary | ICD-10-CM | POA: Diagnosis not present

## 2021-08-13 DIAGNOSIS — E1122 Type 2 diabetes mellitus with diabetic chronic kidney disease: Secondary | ICD-10-CM | POA: Diagnosis not present

## 2021-08-13 DIAGNOSIS — I129 Hypertensive chronic kidney disease with stage 1 through stage 4 chronic kidney disease, or unspecified chronic kidney disease: Secondary | ICD-10-CM | POA: Diagnosis not present

## 2021-08-13 DIAGNOSIS — E1151 Type 2 diabetes mellitus with diabetic peripheral angiopathy without gangrene: Secondary | ICD-10-CM | POA: Diagnosis not present

## 2021-08-13 DIAGNOSIS — Z6841 Body Mass Index (BMI) 40.0 and over, adult: Secondary | ICD-10-CM | POA: Diagnosis not present

## 2021-08-13 DIAGNOSIS — N183 Chronic kidney disease, stage 3 unspecified: Secondary | ICD-10-CM | POA: Diagnosis not present

## 2021-08-17 DIAGNOSIS — Z6841 Body Mass Index (BMI) 40.0 and over, adult: Secondary | ICD-10-CM | POA: Diagnosis not present

## 2021-08-17 DIAGNOSIS — N183 Chronic kidney disease, stage 3 unspecified: Secondary | ICD-10-CM | POA: Diagnosis not present

## 2021-08-17 DIAGNOSIS — F32A Depression, unspecified: Secondary | ICD-10-CM | POA: Diagnosis not present

## 2021-08-17 DIAGNOSIS — E1169 Type 2 diabetes mellitus with other specified complication: Secondary | ICD-10-CM | POA: Diagnosis not present

## 2021-08-17 DIAGNOSIS — H538 Other visual disturbances: Secondary | ICD-10-CM | POA: Diagnosis not present

## 2021-08-17 DIAGNOSIS — I129 Hypertensive chronic kidney disease with stage 1 through stage 4 chronic kidney disease, or unspecified chronic kidney disease: Secondary | ICD-10-CM | POA: Diagnosis not present

## 2021-08-17 DIAGNOSIS — E1122 Type 2 diabetes mellitus with diabetic chronic kidney disease: Secondary | ICD-10-CM | POA: Diagnosis not present

## 2021-08-17 DIAGNOSIS — E1151 Type 2 diabetes mellitus with diabetic peripheral angiopathy without gangrene: Secondary | ICD-10-CM | POA: Diagnosis not present

## 2021-08-17 DIAGNOSIS — E669 Obesity, unspecified: Secondary | ICD-10-CM | POA: Diagnosis not present

## 2021-08-19 ENCOUNTER — Encounter: Payer: Self-pay | Admitting: Internal Medicine

## 2021-08-19 ENCOUNTER — Ambulatory Visit (INDEPENDENT_AMBULATORY_CARE_PROVIDER_SITE_OTHER): Payer: Medicare HMO | Admitting: Internal Medicine

## 2021-08-19 ENCOUNTER — Other Ambulatory Visit: Payer: Self-pay | Admitting: Internal Medicine

## 2021-08-19 ENCOUNTER — Other Ambulatory Visit: Payer: Self-pay

## 2021-08-19 VITALS — BP 200/90 | HR 60 | Temp 98.3°F | Resp 28 | Ht 67.0 in | Wt 268.5 lb

## 2021-08-19 DIAGNOSIS — E669 Obesity, unspecified: Secondary | ICD-10-CM | POA: Diagnosis not present

## 2021-08-19 DIAGNOSIS — E1169 Type 2 diabetes mellitus with other specified complication: Secondary | ICD-10-CM | POA: Diagnosis not present

## 2021-08-19 DIAGNOSIS — I10 Essential (primary) hypertension: Secondary | ICD-10-CM | POA: Diagnosis not present

## 2021-08-19 NOTE — Patient Instructions (Addendum)
Alicia [name], it was a pleasure seeing you today! You endorsed feeling well today. Below are some of the things we talked about this visit. We look forward to seeing you in the follow up appointment!  Today we discussed: You had elevated blood pressure but were asymptomatic. You stated your blood pressure runs around 160s-170s/80-90s at home. This a high blood pressure. You are taking 4 blood pressure medications. Toprol 50 mg daily, Nifedipine 60 mg daily, Lasix 40 mg daily, and irbesartan 75 mg daily. I did not see Nifedipine dispensed but you stated you are taking that medications. I will refill our Nifedipine 60 mg but will hold on the Lasix until I get your lab results. Your sugars were running high and we will add Metformin if your lab work is okay. Please follow up in one week. You do have an appointment with Family Medicine so follow up with them.   I have ordered the following labs today:   Lab Orders         BMP8+Anion Gap       Referrals ordered today:   Referral Orders  No referral(s) requested today     I have ordered the following medication/changed the following medications:   Stop the following medications: There are no discontinued medications.   Start the following medications: No orders of the defined types were placed in this encounter.    Follow-up: 1 week follow up.   Please make sure to arrive 15 minutes prior to your next appointment. If you arrive late, you may be asked to reschedule.   We look forward to seeing you next time. Please call our clinic at [phone] if you have any questions or concerns. The best time to call is Monday-Friday from 9am-4pm, but there is someone available 24/7. If after hours or the weekend, call the main hospital number and ask for the Internal Medicine Resident On-Call. If you need medication refills, please notify your pharmacy one week in advance and they will send [place] a request.  Thank you for letting us take part  in your care. Wishing you the best!  Thank you, [name], MD

## 2021-08-19 NOTE — Progress Notes (Signed)
   CC: follow up  HPI:  [name] is a 70 y.o. with medical history as below presenting to [facility] for follow up.  Please see problem-based list for further details, assessments, and plans.  Past Medical History:  Diagnosis Date   Allergy    Arthritis    Chronic headaches    Constipation    Depression    Diabetes mellitus    Hyperlipidemia    Hypertension    Obesity    PAD (peripheral artery disease) (HCC)    Shortness of breath    Stage 3 chronic kidney disease ([place])    Stroke ([place])    Review of Systems:  Review of system negative unless stated in the problem list or HPI.    Physical Exam:  Vitals:   08/19/21 1053 08/19/21 1109  BP: (!) 201/82 (!) 200/90  Pulse: 66 60  Resp: (!) 28   Temp: 98.3 F (36.8 C)   TempSrc: Oral   SpO2: 98%   Weight: 268 lb 8 oz (121.8 kg)   Height: 5\' 7"  (1.702 m)     Physical Exam Constitutional:      General: She is not in acute distress.    Appearance: Normal appearance. She is not ill-appearing.  HENT:     Head: Normocephalic and atraumatic.     Right Ear: External ear normal.     Left Ear: External ear normal.     Nose: Nose normal.     Mouth/Throat:     Mouth: Mucous membranes are moist.     Pharynx: Oropharynx is clear.  Eyes:     Extraocular Movements: Extraocular movements intact.     Conjunctiva/sclera: Conjunctivae normal.     Pupils: Pupils are equal, round, and reactive to light.  Cardiovascular:     Rate and Rhythm: Normal rate and regular rhythm.     Pulses: Normal pulses.     Heart sounds: Normal heart sounds. No murmur heard.   No friction rub. No gallop.  Pulmonary:     Effort: Pulmonary effort is normal. No respiratory distress.     Breath sounds: Normal breath sounds. No stridor. No wheezing or rhonchi.  Abdominal:     General: Bowel sounds are normal. There is no distension.     Palpations: Abdomen is soft. There is no mass.  Musculoskeletal:        General: No swelling or tenderness.  Normal range of motion.     Cervical back: Normal range of motion and neck supple.     Right lower leg: No edema.     Left lower leg: Edema (1+) present.  Skin:    Capillary Refill: Capillary refill takes less than 2 seconds.     Coloration: Skin is not jaundiced.     Findings: No erythema, lesion or rash.  Neurological:     General: No focal deficit present.     Mental Status: She is alert and oriented to person, place, and time. Mental status is at baseline.  Psychiatric:        Mood and Affect: Mood normal.        Behavior: Behavior normal.    Assessment & Plan:   See Encounters Tab for problem based charting.  Patient seen with Dr. Edrick Kins, MD

## 2021-08-20 LAB — BMP8+ANION GAP
Anion Gap: 17 mmol/L (ref 10.0–18.0)
BUN/Creatinine Ratio: 16 (ref 12–28)
BUN: 26 mg/dL (ref 8–27)
CO2: 23 mmol/L (ref 20–29)
Calcium: 8.9 mg/dL (ref 8.7–10.3)
Chloride: 102 mmol/L (ref 96–106)
Creatinine, Ser: 1.61 mg/dL — ABNORMAL HIGH (ref 0.57–1.00)
Glucose: 170 mg/dL — ABNORMAL HIGH (ref 70–99)
Potassium: 4 mmol/L (ref 3.5–5.2)
Sodium: 142 mmol/L (ref 134–144)
eGFR: 34 mL/min/{1.73_m2} — ABNORMAL LOW (ref >59)

## 2021-08-21 MED ORDER — CHLORTHALIDONE 25 MG PO TABS: 25.0000 mg | ORAL_TABLET | Freq: Every day | ORAL | 0 refills | Status: DC

## 2021-08-21 MED ORDER — NIFEDIPINE ER OSMOTIC RELEASE 60 MG PO TB24: 60.0000 mg | ORAL_TABLET | Freq: Every day | ORAL | 0 refills | Status: DC

## 2021-08-21 MED ORDER — METFORMIN HCL 500 MG PO TABS: ORAL_TABLET | ORAL | 14 refills | Status: DC

## 2021-08-22 MED ORDER — SITAGLIPTIN PHOSPHATE 25 MG PO TABS: 25.0000 mg | ORAL_TABLET | Freq: Every day | ORAL | 0 refills | Status: DC

## 2021-08-22 MED ORDER — METFORMIN HCL 1000 MG PO TABS: ORAL_TABLET | ORAL | 7 refills | Status: DC

## 2021-08-22 NOTE — Assessment & Plan Note (Signed)
Assessment: Patient presenting to clinic for follow up on blood pressure. She stated her home readings were 160-200/70-110 at home. She has history of uncontrolled bp with history of hypertensive urgency. She stated she was taking all of her medications. When asked to name what she was taking, she stated all of the medications on her list. Her pharmacy dispense report showed the nifedpidine was not dispensed but patient stated she was taking it and was about to run out. Looking at her past records, she had difficult to control bp due to medication intolerance and has been on Nifedipine for a long time. She was last controlled on Nifedipine, Irbesartan, Toprol, and Spironolactone. To avoid problems with potassium, we will trial Chlorthalidone. We will check BMP this visit as she has been taking Lasix since discharge.   Plan: -Continue Nifedipine 60 mg, Toprol 50 mg XL, Ibesartan 75 mg daily, Replace Lasix with Chlorthalidone 25 mg -Follow up next week with bp log -BMP this visit

## 2021-08-22 NOTE — Progress Notes (Signed)
Subjective:    Alicia [name] - 70 y.o. female MRN [id number]  Date of birth: [birth year]  HPI  Alicia [name] is to establish care.   Current issues and/or concerns: HYPERTENSION: Visit 08/19/2021 at [facility] per MD note: -Continue Nifedipine 60 mg, Toprol 50 mg XL, Ibesartan 75 mg daily, Replace Lasix with Chlorthalidone 25 mg -Follow up next week with bp log -BMP this visit  08/27/2021: Med Adherence: [x]  Yes    []  No Medication side effects: []  Yes    [x]  No Adherence with salt restriction (low-salt diet): trying Exercise: Yes []  No [x]  Home Monitoring?: [x]  Yes    []  No Home BP results range: [x]  Yes, 190's/80's Smoking [x]  Yes, reports smoking enough SOB? [x]  Yes, with ambulation, declines chest x-ray Chest Pain?: [x]  Yes, intermittent Dizziness? [x]  Yes    []  No  ROS per HPI    Health Maintenance:  Health Maintenance Due  Topic Date Due   Hepatitis C Screening  Never done   Zoster Vaccines- Shingrix (1 of 2) Never done   DEXA SCAN  Never done   OPHTHALMOLOGY EXAM  07/25/2019   MAMMOGRAM  10/18/2019   COVID-19 Vaccine (4 - Booster for Moderna series) 02/08/2021   FOOT EXAM  03/31/2021    Past Medical History: Patient Active Problem List   Diagnosis Date Noted   CHF exacerbation ([place]) 07/18/2021   LVH (left ventricular hypertrophy) due to hypertensive disease, with heart failure ([place]) 07/18/2021   Exacerbation of RAD (reactive airway disease) 02/27/2020   Hypertensive urgency 02/27/2020   CKD (chronic kidney disease) stage 3, GFR 30-59 ml/min ([place]) 02/27/2020   Tobacco use 02/27/2020   Hypertensive emergency 02/27/2020   Prolonged Q-T interval on ECG 04/27/2018   CHF (congestive heart failure) ([place]) 04/27/2018   Weakness 04/03/2018   Syncope 04/03/2018   Renal insufficiency 03/16/2018   Diabetes mellitus type 2 in obese ([place]) 03/16/2018   Morbid obesity ([place])    CVA (cerebral vascular accident) ([place]) 03/15/2018    Numbness in both hands- and Feet 06/06/2014   Weakness of both legs 06/06/2014   Swelling of limb-Left Leg > Right 06/06/2014   Peripheral vascular disease, unspecified ([place]) 11/30/2012   Pain in limb 11/30/2012   Atherosclerosis of native arteries of the extremities with intermittent claudication 04/06/2012   CHEST PAIN 01/03/2011   Hyperlipidemia 07/20/2007   Essential hypertension, benign 07/20/2007   Arthropathy 07/20/2007    Social History   reports that she has been smoking cigarettes. She has a 17.50 pack-year smoking history. She has never used smokeless tobacco. She reports current alcohol use of about 1.0 standard drink per week. She reports that she does not use drugs.   Family History  family history includes Diabetes in her father; Heart disease in her brother and mother; Hypertension in her daughter, mother, and sister; Varicose Veins in her brother.   Medications: reviewed and updated   Objective:   Physical Exam BP (!) 174/88   Pulse 82   Temp 98.7 F (37.1 C) (Oral)   Ht 5\' 7"  (1.702 m)   Wt 267 lb 6.4 oz (121.3 kg)   SpO2 95%   BMI 41.88 kg/m   Physical Exam HENT:     Head: Normocephalic and atraumatic.  Eyes:     Extraocular Movements: Extraocular movements intact.     Conjunctiva/sclera: Conjunctivae normal.     Pupils: Pupils are equal, round, and reactive to light.  Cardiovascular:     Rate  and Rhythm: Normal rate and regular rhythm.     Pulses: Normal pulses.     Heart sounds: Normal heart sounds.  Pulmonary:     Effort: Pulmonary effort is normal.     Breath sounds: Normal breath sounds.  Musculoskeletal:     Cervical back: Normal range of motion and neck supple.  Neurological:     General: No focal deficit present.     Mental Status: She is alert and oriented to person, place, and time.  Psychiatric:        Mood and Affect: Mood normal.        Behavior: Behavior normal.     Assessment & Plan:  1. Encounter to establish care: - Patient  presents today to establish care.  - Return for annual physical examination, labs, and health maintenance. Arrive fasting meaning having no food for at least 8 hours prior to appointment. You may have only water or black coffee. Please take scheduled medications as normal.  2. Uncontrolled hypertension: - Blood pressure not at goal during today's visit. Patient asymptomatic without chest pressure, chest pain, palpitations, shortness of breath, worst headache of life, and any additional red flag symptoms. - Continue Nifedipine, Metoprolol Succinate, Irbesartan, and Chlorthalidone as prescribed.  - Counseled on blood pressure goal of less than 140/90, low-sodium, DASH diet, medication compliance, 150 minutes of moderate intensity exercise per week as tolerated. Discussed medication compliance, adverse effects. - Referral to [facility] for further evaluation and management.  - Ambulatory referral to [facility] - [facility] [place]    Patient was given clear instructions to go to Emergency Department or return to medical center if symptoms don't improve, worsen, or new problems develop.The patient verbalized understanding.  I discussed the assessment and treatment plan with the patient. The patient was provided an opportunity to ask questions and all were answered. The patient agreed with the plan and demonstrated an understanding of the instructions.   The patient was advised to call back or seek an in-person evaluation if the symptoms worsen or if the condition fails to improve as anticipated.    Durene Fruits, NP 08/27/2021, 10:45 AM Primary Care at [facility]

## 2021-08-22 NOTE — Assessment & Plan Note (Signed)
Assessment: Patient has diabetes and was metformin 1000 mg BID before her hospitalization for hypertensive urgency. Januvia 25 mg was started at discharge due to elevated creatinine. Patient reports elevated readings with 160s-220s recently.   Plan: -Januvia 25 mg qd  -Restart Metformin 500mg  and titrate up to 1000mg  BID -Follow up in 1 week with glucose log

## 2021-08-23 DIAGNOSIS — N183 Chronic kidney disease, stage 3 unspecified: Secondary | ICD-10-CM | POA: Diagnosis not present

## 2021-08-23 DIAGNOSIS — E1122 Type 2 diabetes mellitus with diabetic chronic kidney disease: Secondary | ICD-10-CM | POA: Diagnosis not present

## 2021-08-23 DIAGNOSIS — Z6841 Body Mass Index (BMI) 40.0 and over, adult: Secondary | ICD-10-CM | POA: Diagnosis not present

## 2021-08-23 DIAGNOSIS — E1151 Type 2 diabetes mellitus with diabetic peripheral angiopathy without gangrene: Secondary | ICD-10-CM | POA: Diagnosis not present

## 2021-08-23 DIAGNOSIS — F32A Depression, unspecified: Secondary | ICD-10-CM | POA: Diagnosis not present

## 2021-08-23 DIAGNOSIS — E1169 Type 2 diabetes mellitus with other specified complication: Secondary | ICD-10-CM | POA: Diagnosis not present

## 2021-08-23 DIAGNOSIS — E669 Obesity, unspecified: Secondary | ICD-10-CM | POA: Diagnosis not present

## 2021-08-23 DIAGNOSIS — I129 Hypertensive chronic kidney disease with stage 1 through stage 4 chronic kidney disease, or unspecified chronic kidney disease: Secondary | ICD-10-CM | POA: Diagnosis not present

## 2021-08-23 DIAGNOSIS — H538 Other visual disturbances: Secondary | ICD-10-CM | POA: Diagnosis not present

## 2021-08-25 ENCOUNTER — Telehealth: Payer: Medicare HMO

## 2021-08-25 ENCOUNTER — Telehealth: Payer: Self-pay

## 2021-08-25 NOTE — Telephone Encounter (Signed)
  Care Management   Outreach Note  08/25/2021 Name: Alicia [name] MRN: [id number] DOB: 12/09/1950  Referred by: Just, Laurita Quint, FNP (Inactive) Reason for referral : Chronic Care Management (HTN, DM2)   An unsuccessful telephone outreach was attempted today. The patient was referred to the case management team for assistance with care management and care coordination.   Follow Up Plan: If patient returns call to provider office, please advise to call [place] at [phone] to reschedule appointment.  Johnney Killian, RN, BSN, CCM Care Management Coordinator [facility] Internal Medicine Phone: [phone] / Fax: [phone]

## 2021-08-27 ENCOUNTER — Telehealth: Payer: Self-pay | Admitting: *Deleted

## 2021-08-27 ENCOUNTER — Other Ambulatory Visit: Payer: Self-pay

## 2021-08-27 ENCOUNTER — Encounter: Payer: Self-pay | Admitting: Family

## 2021-08-27 ENCOUNTER — Ambulatory Visit (INDEPENDENT_AMBULATORY_CARE_PROVIDER_SITE_OTHER): Payer: Medicare HMO | Admitting: Family

## 2021-08-27 VITALS — BP 174/88 | HR 82 | Temp 98.7°F | Ht 67.0 in | Wt 267.4 lb

## 2021-08-27 DIAGNOSIS — I10 Essential (primary) hypertension: Secondary | ICD-10-CM | POA: Diagnosis not present

## 2021-08-27 DIAGNOSIS — Z7689 Persons encountering health services in other specified circumstances: Secondary | ICD-10-CM | POA: Diagnosis not present

## 2021-08-27 NOTE — Progress Notes (Signed)
Internal Medicine Clinic Attending  I saw and evaluated the patient.  I personally confirmed the key portions of the history and exam documented by Dr. Khan and I reviewed pertinent patient test results.  The assessment, diagnosis, and plan were formulated together and I agree with the documentation in the resident's note.  

## 2021-08-27 NOTE — Patient Instructions (Signed)
Thank you for choosing Primary Care at [facility] for your medical home!    Alicia [name] was seen by Camillia Herter, NP today.   Alicia [name] primary care provider is Sidhant Helderman Zachery Dauer, NP.   For the best care possible,  you should try to see Durene Fruits, NP whenever you come to clinic.   We look forward to seeing you again soon!  If you have any questions about your visit today,  please call us at [phone]  Or feel free to reach your provider via [place].    Keeping you healthy   Get these tests Blood pressure- Have your blood pressure checked once a year by your healthcare provider.  Normal blood pressure is 120/80. Weight- Have your body mass index (BMI) calculated to screen for obesity.  BMI is a measure of body fat based on height and weight. You can also calculate your own BMI at [employer]velB[employer].it. Cholesterol- Have your cholesterol checked regularly starting at age 11, sooner may be necessary if you have diabetes, high blood pressure, if a family member developed heart diseases at an early age or if you smoke.  Chlamydia, HIV, and other sexual transmitted disease- Get screened each year until the age of 63 then within three months of each new sexual partner. Diabetes- Have your blood sugar checked regularly if you have high blood pressure, high cholesterol, a family history of diabetes or if you are overweight.   Get these vaccines Flu shot- Every fall. Tetanus shot- Every 10 years. Menactra- Single dose; prevents meningitis.   Take these steps Don't smoke- If you do smoke, ask your healthcare provider about quitting. For tips on how to quit, go to www.smokefree.gov or call 1-800-QUIT-NOW. Be physically active- Exercise 5 days a week for at least 30 minutes.  If you are not already physically active start slow and gradually work up to 30 minutes of moderate physical activity.  Examples of moderate activity include walking briskly, mowing the yard,  dancing, swimming bicycling, etc. Eat a healthy diet- Eat a variety of healthy foods such as fruits, vegetables, low fat milk, low fat cheese, yogurt, lean meats, poultry, fish, beans, tofu, etc.  For more information on healthy eating, go to www.thenutritionsource.org Drink alcohol in moderation- Limit alcohol intake two drinks or less a day.  Never drink and drive. Dentist- Brush and floss teeth twice daily; visit your dentis twice a year. Depression-Your emotional health is as important as your physical health.  If you're feeling down, losing interest in things you normally enjoy please talk with your healthcare provider. Gun Safety- If you keep a gun in your home, keep it unloaded and with the safety lock on.  Bullets should be stored separately. Helmet use- Always wear a helmet when riding a motorcycle, bicycle, rollerblading or skateboarding. Safe sex- If you may be exposed to a sexually transmitted infection, use a condom Seat belts- Seat bels can save your life; always wear one. Smoke/Carbon Monoxide detectors- These detectors need to be installed on the appropriate level of your home.  Replace batteries at least once a year. Skin Cancer- When out in the sun, cover up and use sunscreen SPF 15 or higher. Violence- If anyone is threatening or hurting you, please tell your healthcare provider.

## 2021-08-27 NOTE — Progress Notes (Signed)
Is interested in beginning home health services

## 2021-08-27 NOTE — Chronic Care Management (AMB) (Signed)
  Care Management   Note  08/27/2021 Name: Alicia [name] MRN: [id number] DOB: 09-29-1951  Alicia [name] is a 70 y.o. year old female who is a primary care patient of Just, Laurita Quint, FNP (Inactive) and is actively engaged with the care management team. I reached out to Arta Bruce by phone today to assist with re-scheduling a follow up visit with the RN Case Manager  Follow up plan: Unsuccessful telephone outreach attempt made. A HIPAA compliant phone message was left for the patient providing contact information and requesting a return call.  The care management team will reach out to the patient again over the next 7 days.  If patient returns call to provider office, please advise to call [place] at [phone].  [place] Management  Direct Dial: [phone]

## 2021-09-02 ENCOUNTER — Telehealth: Payer: Self-pay | Admitting: *Deleted

## 2021-09-02 DIAGNOSIS — E669 Obesity, unspecified: Secondary | ICD-10-CM | POA: Diagnosis not present

## 2021-09-02 DIAGNOSIS — F32A Depression, unspecified: Secondary | ICD-10-CM | POA: Diagnosis not present

## 2021-09-02 DIAGNOSIS — H538 Other visual disturbances: Secondary | ICD-10-CM | POA: Diagnosis not present

## 2021-09-02 DIAGNOSIS — E1151 Type 2 diabetes mellitus with diabetic peripheral angiopathy without gangrene: Secondary | ICD-10-CM | POA: Diagnosis not present

## 2021-09-02 DIAGNOSIS — I509 Heart failure, unspecified: Secondary | ICD-10-CM | POA: Diagnosis not present

## 2021-09-02 DIAGNOSIS — I129 Hypertensive chronic kidney disease with stage 1 through stage 4 chronic kidney disease, or unspecified chronic kidney disease: Secondary | ICD-10-CM | POA: Diagnosis not present

## 2021-09-02 DIAGNOSIS — I639 Cerebral infarction, unspecified: Secondary | ICD-10-CM | POA: Diagnosis not present

## 2021-09-02 DIAGNOSIS — E1169 Type 2 diabetes mellitus with other specified complication: Secondary | ICD-10-CM | POA: Diagnosis not present

## 2021-09-02 DIAGNOSIS — Z6841 Body Mass Index (BMI) 40.0 and over, adult: Secondary | ICD-10-CM | POA: Diagnosis not present

## 2021-09-02 DIAGNOSIS — E1122 Type 2 diabetes mellitus with diabetic chronic kidney disease: Secondary | ICD-10-CM | POA: Diagnosis not present

## 2021-09-02 DIAGNOSIS — N183 Chronic kidney disease, stage 3 unspecified: Secondary | ICD-10-CM | POA: Diagnosis not present

## 2021-09-02 DIAGNOSIS — I10 Essential (primary) hypertension: Secondary | ICD-10-CM | POA: Diagnosis not present

## 2021-09-02 NOTE — Telephone Encounter (Signed)
Call from [facility], with [facility]. Stated BP is 190 /90. Hx CVA. No c/o dizziness.The nurse stated pt has not taken her BP's meds today; she usually takes them in the afternoon after she eats. She did talked pt into her meds as we were talking - she took all her meds except Januvia. Pt has been having muscle and joint pain and she thinks it from [place] b/c it's a new med for her. The nurse stated pt is taking Metformin and BS is 148. Pt also has LLE 3+ pedal edema - the nurse has discussed w/pt about low NA+ diet -pt has a scale but no batteries. She plans to get batteries for pt. And plans to re-check pt's BP later.

## 2021-09-02 NOTE — Telephone Encounter (Signed)
Called pt to schedule an appt - no answer, continues to rings, unable to leave a message. I will ask front office to call pt also.

## 2021-09-03 NOTE — Chronic Care Management (AMB) (Signed)
  Care Management   Note  09/03/2021 Name: [name] MRN: [id number] DOB: Aug 08, 1951  Alicia [name] is a 70 y.o. year old female who is a primary care patient of Just, Laurita Quint, FNP (Inactive) and is actively engaged with the care management team. I reached out to Arta Bruce by phone today to assist with re-scheduling a follow up visit with the RN Case Manager  Follow up plan: A second unsuccessful telephone outreach attempt made. A HIPAA compliant phone message was left for the patient providing contact information and requesting a return call. The care management team will reach out to the patient again over the next 7 days. If patient returns call to provider office, please advise to call [place] at [phone].  [place] Management  Direct Dial: [phone]

## 2021-09-06 NOTE — Telephone Encounter (Signed)
Called pt again to schedule an appt - continues to rings and no answer.

## 2021-09-07 ENCOUNTER — Encounter: Payer: Self-pay | Admitting: Student

## 2021-09-07 NOTE — Telephone Encounter (Signed)
Since pt has an appt  10/01/2021 9:20 AM Camillia Herter, NP [facility] ; I will not try to call pt to schedule an appt here at [facility].

## 2021-09-08 ENCOUNTER — Telehealth: Payer: Self-pay | Admitting: Family Medicine

## 2021-09-08 NOTE — Telephone Encounter (Signed)
   Telephone encounter was:  Unsuccessful.  09/08/2021 Name: Alicia [name] MRN: [id number] DOB: September 30, 1951  Unsuccessful outbound call made today to assist with:  Transportation Needs   Outreach Attempt:  3rd Attempt.  Referral closed unable to contact patient.  Patients phone just rings, no voicemail to leave a message for call back. I called yesterday at 1:42 pm, which would have been the second attempt. This is the third so I am closing the referral.   April Green Care Guide, Embedded Care Coordination [place], Care Management Phone: [phone] Email: [email or web address]

## 2021-09-10 NOTE — Chronic Care Management (AMB) (Signed)
  Care Management   Note  09/10/2021 Name: MYLIAH MEDEL MRN: [id number] DOB: Jun 03, 1951  KAYANN MAJ is a 70 y.o. year old female who is a primary care patient of Just, Laurita Quint, FNP (Inactive) and is actively engaged with the care management team. I reached out to Arta Bruce by phone today to assist with re-scheduling a follow up visit with the RN Case Manager  Follow up plan: Third unsuccessful telephone outreach attempt made. A HIPAA compliant phone message was left for the patient providing contact information and requesting a return call. We have been unable to make contact with the patient for follow up. The care management team is available to follow up with the patient after provider conversation with the patient regarding recommendation for care management engagement and subsequent re-referral to the care management team.   [place] Management  Direct Dial: [phone]

## 2021-09-14 DIAGNOSIS — E1151 Type 2 diabetes mellitus with diabetic peripheral angiopathy without gangrene: Secondary | ICD-10-CM | POA: Diagnosis not present

## 2021-09-14 DIAGNOSIS — E1122 Type 2 diabetes mellitus with diabetic chronic kidney disease: Secondary | ICD-10-CM | POA: Diagnosis not present

## 2021-09-14 DIAGNOSIS — E1169 Type 2 diabetes mellitus with other specified complication: Secondary | ICD-10-CM | POA: Diagnosis not present

## 2021-09-14 DIAGNOSIS — H538 Other visual disturbances: Secondary | ICD-10-CM | POA: Diagnosis not present

## 2021-09-14 DIAGNOSIS — F32A Depression, unspecified: Secondary | ICD-10-CM | POA: Diagnosis not present

## 2021-09-14 DIAGNOSIS — N183 Chronic kidney disease, stage 3 unspecified: Secondary | ICD-10-CM | POA: Diagnosis not present

## 2021-09-14 DIAGNOSIS — Z6841 Body Mass Index (BMI) 40.0 and over, adult: Secondary | ICD-10-CM | POA: Diagnosis not present

## 2021-09-14 DIAGNOSIS — E669 Obesity, unspecified: Secondary | ICD-10-CM | POA: Diagnosis not present

## 2021-09-14 DIAGNOSIS — I129 Hypertensive chronic kidney disease with stage 1 through stage 4 chronic kidney disease, or unspecified chronic kidney disease: Secondary | ICD-10-CM | POA: Diagnosis not present

## 2021-09-21 DIAGNOSIS — N183 Chronic kidney disease, stage 3 unspecified: Secondary | ICD-10-CM | POA: Diagnosis not present

## 2021-09-21 DIAGNOSIS — H538 Other visual disturbances: Secondary | ICD-10-CM | POA: Diagnosis not present

## 2021-09-21 DIAGNOSIS — E1122 Type 2 diabetes mellitus with diabetic chronic kidney disease: Secondary | ICD-10-CM | POA: Diagnosis not present

## 2021-09-21 DIAGNOSIS — E669 Obesity, unspecified: Secondary | ICD-10-CM | POA: Diagnosis not present

## 2021-09-21 DIAGNOSIS — F32A Depression, unspecified: Secondary | ICD-10-CM | POA: Diagnosis not present

## 2021-09-21 DIAGNOSIS — E1151 Type 2 diabetes mellitus with diabetic peripheral angiopathy without gangrene: Secondary | ICD-10-CM | POA: Diagnosis not present

## 2021-09-21 DIAGNOSIS — I129 Hypertensive chronic kidney disease with stage 1 through stage 4 chronic kidney disease, or unspecified chronic kidney disease: Secondary | ICD-10-CM | POA: Diagnosis not present

## 2021-09-21 DIAGNOSIS — E1169 Type 2 diabetes mellitus with other specified complication: Secondary | ICD-10-CM | POA: Diagnosis not present

## 2021-09-21 DIAGNOSIS — Z6841 Body Mass Index (BMI) 40.0 and over, adult: Secondary | ICD-10-CM | POA: Diagnosis not present

## 2021-10-01 ENCOUNTER — Encounter: Payer: Self-pay | Admitting: Family

## 2021-10-01 ENCOUNTER — Encounter: Payer: Medicare HMO | Admitting: Family

## 2021-10-01 ENCOUNTER — Other Ambulatory Visit: Payer: Self-pay

## 2021-10-01 VITALS — BP 196/83 | HR 82 | Temp 97.9°F | Resp 18 | Wt 268.4 lb

## 2021-10-01 DIAGNOSIS — Z0000 Encounter for general adult medical examination without abnormal findings: Secondary | ICD-10-CM

## 2021-10-01 DIAGNOSIS — Z13228 Encounter for screening for other metabolic disorders: Secondary | ICD-10-CM

## 2021-10-01 DIAGNOSIS — I10 Essential (primary) hypertension: Secondary | ICD-10-CM

## 2021-10-01 DIAGNOSIS — Z1159 Encounter for screening for other viral diseases: Secondary | ICD-10-CM

## 2021-10-01 DIAGNOSIS — Z1329 Encounter for screening for other suspected endocrine disorder: Secondary | ICD-10-CM

## 2021-10-01 DIAGNOSIS — E119 Type 2 diabetes mellitus without complications: Secondary | ICD-10-CM

## 2021-10-01 DIAGNOSIS — N1832 Chronic kidney disease, stage 3b: Secondary | ICD-10-CM

## 2021-10-01 MED ORDER — CLONIDINE HCL 0.1 MG PO TABS: 0.1000 mg | ORAL_TABLET | Freq: Once | ORAL | Status: DC

## 2021-10-01 MED ORDER — FUROSEMIDE 20 MG PO TABS: 20.0000 mg | ORAL_TABLET | Freq: Every day | ORAL | 0 refills | Status: DC

## 2021-10-01 NOTE — Addendum Note (Signed)
Addended by: Camillia Herter on: 10/01/2021 04:13 PM   Modules accepted: Orders, Level of Service

## 2021-10-01 NOTE — Progress Notes (Addendum)
Pt presents for annual exam, pt complaints of feet swelling

## 2021-10-01 NOTE — Addendum Note (Signed)
Addended by: Elmon Else on: 10/01/2021 11:05 AM   Modules accepted: Orders

## 2021-10-01 NOTE — Progress Notes (Addendum)
Patient ID: Alicia [name], female    DOB: 02-22-1951  MRN: [id number]  CC: Hypertension Follow-Up  Subjective: Alicia [name] is a 70 y.o. female who presents for hypertension follow-up.   Her concerns today include:   HYPERTENSION FOLLOW-UP: 08/27/2021: - Continue Nifedipine, Metoprolol Succinate, Irbesartan, and Chlorthalidone as prescribed.  - Referral to [facility] for further evaluation and management.  10/01/2021: Patient presents today reporting that she is upset because of the long wait in the lobby. Reports she checked in at 9:33 am and was not taken to a room until 11:00 am. Reports her blood pressure is elevated more today because of being frustrated. Home blood pressures are higher than normal and about the same as today's office visit. Taking all blood pressure medications as prescribed. She is monitoring what she eats/salt intake. Endorses bilateral feet edema. Denies shortness of breath, chest pain, worst headache of life, and additional red flag symptoms. Reports she does not answer phone calls if she doesn't know the number. Reports that is why she missed the Cardiology referral appointment call in October 2022.    Patient Active Problem List   Diagnosis Date Noted   CHF exacerbation ([place]) 07/18/2021   LVH (left ventricular hypertrophy) due to hypertensive disease, with heart failure ([place]) 07/18/2021   Exacerbation of RAD (reactive airway disease) 02/27/2020   Hypertensive urgency 02/27/2020   CKD (chronic kidney disease) stage 3, GFR 30-59 ml/min ([place]) 02/27/2020   Tobacco use 02/27/2020   Hypertensive emergency 02/27/2020   Prolonged Q-T interval on ECG 04/27/2018   CHF (congestive heart failure) ([place]) 04/27/2018   Weakness 04/03/2018   Syncope 04/03/2018   Renal insufficiency 03/16/2018   Diabetes mellitus type 2 in obese ([place]) 03/16/2018   Morbid obesity ([place])    CVA (cerebral vascular accident) ([place]) 03/15/2018   Numbness in  both hands- and Feet 06/06/2014   Weakness of both legs 06/06/2014   Swelling of limb-Left Leg > Right 06/06/2014   Peripheral vascular disease, unspecified ([place]) 11/30/2012   Pain in limb 11/30/2012   Atherosclerosis of native arteries of the extremities with intermittent claudication 04/06/2012   CHEST PAIN 01/03/2011   Hyperlipidemia 07/20/2007   Essential hypertension, benign 07/20/2007   Arthropathy 07/20/2007     Current Outpatient Medications on File Prior to Visit  Medication Sig Dispense Refill   acetaminophen (TYLENOL) 500 MG tablet Take 2 tablets (1,000 mg total) by mouth every 8 (eight) hours as needed for moderate pain or headache. 30 tablet 0   aspirin 81 MG EC tablet Take 1 tablet (81 mg total) by mouth daily. 30 tablet 0   blood glucose meter kit and supplies per insurance preference. Check cbg once a day. Dx E11.9 1 each 11   irbesartan (AVAPRO) 75 MG tablet Take 1 tablet (75 mg total) by mouth daily. Take one daily 90 tablet 2   metFORMIN (GLUCOPHAGE) 1000 MG tablet Take 1 tablet (1,000 mg total) by mouth daily with breakfast for 14 days, THEN 1 tablet (1,000 mg total) 2 (two) times daily with a meal. For diabetes. 90 tablet 7   metoprolol succinate (TOPROL XL) 50 MG 24 hr tablet Take 1 tablet (50 mg total) by mouth daily. Take with or immediately following a meal. 90 tablet 2   NIFEdipine (PROCARDIA XL) 60 MG 24 hr tablet Take 1 tablet (60 mg total) by mouth daily. For high blood pressure 90 tablet 0   OXYGEN Inhale into the lungs.     rosuvastatin (CRESTOR)  10 MG tablet Take 1 tablet (10 mg total) by mouth daily. 90 tablet 3   sitaGLIPtin (JANUVIA) 25 MG tablet Take 1 tablet (25 mg total) by mouth daily. 90 tablet 0   No current facility-administered medications on file prior to visit.    Allergies  Allergen Reactions   Atorvastatin Swelling    Lip swelling   Nifedipine Er Other (See Comments)    Caused nose bleeds in higher doses (90 mg., namely)   Latex Rash     Social History   Socioeconomic History   Marital status: Single    Spouse name: Not on file   Number of children: Not on file   Years of education: Not on file   Highest education level: Not on file  Occupational History   Not on file  Tobacco Use   Smoking status: Light Smoker    Packs/day: 0.50    Years: 35.00    Pack years: 17.50    Types: Cigarettes   Smokeless tobacco: Never   Tobacco comments:    patient is trying to quit and has gone down to half a pack per day  Vaping Use   Vaping Use: Never used  Substance and Sexual Activity   Alcohol use: Yes    Alcohol/week: 1.0 standard drink    Types: 1 Standard drinks or equivalent per week    Comment: occasional use only   Drug use: No   Sexual activity: Not Currently  Other Topics Concern   Not on file  Social History Narrative   Not on file   Social Determinants of Health   Financial Resource Strain: Not on file  Food Insecurity: No Food Insecurity   Worried About Charity fundraiser in the Last Year: Never true   Ran Out of Food in the Last Year: Never true  Transportation Needs: Unmet Transportation Needs   Lack of Transportation (Medical): Yes   Lack of Transportation (Non-Medical): Yes  Physical Activity: Inactive   Days of Exercise per Week: 0 days   Minutes of Exercise per Session: 0 min  Stress: Not on file  Social Connections: Not on file  Intimate Partner Violence: Not on file    Family History  Problem Relation Age of Onset   Diabetes Father    Heart disease Mother        NOT before age 53-  Bypass   Hypertension Mother    Hypertension Sister    Varicose Veins Brother    Heart disease Brother        Before age 83   Hypertension Daughter    Colon cancer Neg Hx    Esophageal cancer Neg Hx    Rectal cancer Neg Hx    Stomach cancer Neg Hx     Past Surgical History:  Procedure Laterality Date   ABDOMINAL AORTOGRAM W/LOWER EXTREMITY N/A 12/17/2018   Procedure: ABDOMINAL AORTOGRAM W/LOWER  EXTREMITY;  Surgeon: Waynetta Sandy, MD;  Location: [facility];  Service: Cardiovascular;  Laterality: N/A;   FEMORAL BYPASS  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 Bridgett Larsson   PERIPHERAL VASCULAR ATHERECTOMY Left 12/17/2018   Procedure: PERIPHERAL VASCULAR ATHERECTOMY;  Surgeon: Waynetta Sandy, MD;  Location: [facility];  Service: Cardiovascular;  Laterality: Left;   PERIPHERAL VASCULAR BALLOON ANGIOPLASTY Left 12/17/2018   Procedure: PERIPHERAL VASCULAR BALLOON ANGIOPLASTY;  Surgeon: Waynetta Sandy, MD;  Location: [facility];  Service: Cardiovascular;  Laterality:  Left;    ROS: Review of Systems Negative except as stated above  PHYSICAL EXAM: BP (!) 196/83 (BP Location: Right Arm, Patient Position: Sitting, Cuff Size: Large)   Pulse 82   Temp 97.9 F (36.6 C)   Resp 18   Wt 268 lb 6.4 oz (121.7 kg)   SpO2 95%   BMI 42.04 kg/m   Physical Exam HENT:     Head: Normocephalic and atraumatic.  Eyes:     Extraocular Movements: Extraocular movements intact.     Conjunctiva/sclera: Conjunctivae normal.     Pupils: Pupils are equal, round, and reactive to light.  Cardiovascular:     Rate and Rhythm: Normal rate and regular rhythm.     Pulses: Normal pulses.     Heart sounds: Normal heart sounds.  Pulmonary:     Effort: Pulmonary effort is normal.     Breath sounds: Normal breath sounds.  Musculoskeletal:     Cervical back: Normal range of motion and neck supple.     Right ankle: Swelling present.     Left ankle: Swelling present.     Comments: Bilateral ankles 1+ edema, no evidence of erythema, tenderness, or pain of bilateral lower extremities.  Neurological:     General: No focal deficit present.     Mental Status: She is alert and oriented to person, place, and time.  Psychiatric:        Mood and Affect: Mood normal.        Behavior: Behavior normal.    ASSESSMENT AND PLAN: 1. Uncontrolled  hypertension: - Blood pressure not at goal during today's visit. Patient asymptomatic without chest pressure, chest pain, palpitations, shortness of breath, worst headache of life, and any additional red flag symptoms. - Continue Nifedipine, Metoprolol Succinate, Irbesartan, and Chlorthalidone as prescribed. No refills needed as of present. - Begin Furosemide as prescribed.  - Clonidine administered today in office.  - Counseled on blood pressure goal of less than 140/90, low-sodium, DASH diet, medication compliance, 150 minutes of moderate intensity exercise per week as tolerated. Discussed medication compliance, adverse effects. - Apologized to patient for the extended wait in the lobby as provider and CMA were unaware patient arrived to the office.  - CMA was able to get patient an appointment with Skeet Latch, MD at [facility] on 12/09/2021. I have also placed an urgent referral to Cardiology to see if we can get patient a sooner appointment. - Follow-up with primary provider as frequently as needed.  - furosemide (LASIX) 20 MG tablet; Take 1 tablet (20 mg total) by mouth daily for 7 days.  Dispense: 7 tablet; Refill: 0 - cloNIDine (CATAPRES) tablet 0.1 mg - Ambulatory referral to Cardiology    Patient was given the opportunity to ask questions.  Patient verbalized understanding of the plan and was able to repeat key elements of the plan. Patient was given clear instructions to go to Emergency Department or return to medical center if symptoms don't improve, worsen, or new problems develop.The patient verbalized understanding.   Orders Placed This Encounter  Procedures   Ambulatory referral to Cardiology    Requested Prescriptions   Signed Prescriptions Disp Refills   furosemide (LASIX) 20 MG tablet 7 tablet 0    Sig: Take 1 tablet (20 mg total) by mouth daily for 7 days.    Follow-up with primary provider as scheduled.   Camillia Herter, NP

## 2021-10-01 NOTE — Addendum Note (Signed)
Addended by: Camillia Herter on: 10/01/2021 12:40 PM   Modules accepted: Orders

## 2021-10-01 NOTE — Addendum Note (Signed)
Addended by: Elmon Else on: 10/01/2021 10:58 AM   Modules accepted: Orders

## 2021-10-03 DIAGNOSIS — I10 Essential (primary) hypertension: Secondary | ICD-10-CM | POA: Diagnosis not present

## 2021-10-03 DIAGNOSIS — I509 Heart failure, unspecified: Secondary | ICD-10-CM | POA: Diagnosis not present

## 2021-10-03 DIAGNOSIS — I639 Cerebral infarction, unspecified: Secondary | ICD-10-CM | POA: Diagnosis not present

## 2021-11-02 DIAGNOSIS — I639 Cerebral infarction, unspecified: Secondary | ICD-10-CM | POA: Diagnosis not present

## 2021-11-02 DIAGNOSIS — I10 Essential (primary) hypertension: Secondary | ICD-10-CM | POA: Diagnosis not present

## 2021-11-02 DIAGNOSIS — I509 Heart failure, unspecified: Secondary | ICD-10-CM | POA: Diagnosis not present

## 2021-11-16 DIAGNOSIS — E119 Type 2 diabetes mellitus without complications: Secondary | ICD-10-CM | POA: Diagnosis not present

## 2021-11-16 DIAGNOSIS — H5213 Myopia, bilateral: Secondary | ICD-10-CM | POA: Diagnosis not present

## 2021-11-16 DIAGNOSIS — H52223 Regular astigmatism, bilateral: Secondary | ICD-10-CM | POA: Diagnosis not present

## 2021-11-29 ENCOUNTER — Ambulatory Visit (INDEPENDENT_AMBULATORY_CARE_PROVIDER_SITE_OTHER): Payer: Medicare HMO | Admitting: Student

## 2021-11-29 ENCOUNTER — Encounter: Payer: Self-pay | Admitting: Student

## 2021-11-29 ENCOUNTER — Other Ambulatory Visit (HOSPITAL_COMMUNITY): Payer: Self-pay

## 2021-11-29 ENCOUNTER — Other Ambulatory Visit: Payer: Self-pay

## 2021-11-29 VITALS — BP 221/97 | HR 73 | Temp 98.8°F | Ht 67.0 in | Wt 281.6 lb

## 2021-11-29 DIAGNOSIS — R42 Dizziness and giddiness: Secondary | ICD-10-CM

## 2021-11-29 DIAGNOSIS — I5033 Acute on chronic diastolic (congestive) heart failure: Secondary | ICD-10-CM | POA: Diagnosis not present

## 2021-11-29 DIAGNOSIS — I110 Hypertensive heart disease with heart failure: Secondary | ICD-10-CM | POA: Diagnosis not present

## 2021-11-29 DIAGNOSIS — I161 Hypertensive emergency: Secondary | ICD-10-CM | POA: Diagnosis not present

## 2021-11-29 DIAGNOSIS — I10 Essential (primary) hypertension: Secondary | ICD-10-CM

## 2021-11-29 MED ORDER — HYDRALAZINE HCL 25 MG PO TABS
25.0000 mg | ORAL_TABLET | Freq: Three times a day (TID) | ORAL | 1 refills | Status: DC
  Filled 2021-11-29: qty 90, 30d supply, fill #0

## 2021-11-29 MED ORDER — FUROSEMIDE 40 MG PO TABS
40.0000 mg | ORAL_TABLET | Freq: Two times a day (BID) | ORAL | 1 refills | Status: DC
  Filled 2021-11-29: qty 30, 15d supply, fill #0

## 2021-11-29 NOTE — Patient Instructions (Addendum)
If you develop chest pain or have worsening of your shortness of breath please call 911 to take you to the emergency room.   We will start you on a medication called hydralazine to help decrease your blood pressure at 25mg  three times a day. You will take your first dose as soon as you pick this medicine up and take an additional dose 8 hours after that.   In addition, if you have left over chlorthalidone please take one dose of this medication.   Finally, we will start you on a water pill to help remove fluid called (lasix) at 40mg  twice a day. Please take this medication as soon as you pick it up.   Thank you! Please do not hesitate to call our office if you have any questions.

## 2021-11-30 ENCOUNTER — Inpatient Hospital Stay (HOSPITAL_COMMUNITY)
Admission: AD | Admit: 2021-11-30 | Discharge: 2021-12-03 | DRG: 291 | Disposition: A | Discharge status: Home Health | Payer: Medicare HMO | Source: Ambulatory Visit | Attending: Student in an Organized Health Care Education/Training Program | Admitting: Student in an Organized Health Care Education/Training Program

## 2021-11-30 DIAGNOSIS — J9611 Chronic respiratory failure with hypoxia: Secondary | ICD-10-CM | POA: Diagnosis not present

## 2021-11-30 DIAGNOSIS — I10 Essential (primary) hypertension: Secondary | ICD-10-CM | POA: Diagnosis not present

## 2021-11-30 DIAGNOSIS — Z7982 Long term (current) use of aspirin: Secondary | ICD-10-CM

## 2021-11-30 DIAGNOSIS — I130 Hypertensive heart and chronic kidney disease with heart failure and stage 1 through stage 4 chronic kidney disease, or unspecified chronic kidney disease: Secondary | ICD-10-CM | POA: Diagnosis not present

## 2021-11-30 DIAGNOSIS — M79605 Pain in left leg: Secondary | ICD-10-CM | POA: Diagnosis not present

## 2021-11-30 DIAGNOSIS — E785 Hyperlipidemia, unspecified: Secondary | ICD-10-CM | POA: Diagnosis not present

## 2021-11-30 DIAGNOSIS — I5033 Acute on chronic diastolic (congestive) heart failure: Secondary | ICD-10-CM | POA: Diagnosis not present

## 2021-11-30 DIAGNOSIS — G4733 Obstructive sleep apnea (adult) (pediatric): Secondary | ICD-10-CM | POA: Diagnosis present

## 2021-11-30 DIAGNOSIS — N2889 Other specified disorders of kidney and ureter: Secondary | ICD-10-CM | POA: Diagnosis not present

## 2021-11-30 DIAGNOSIS — I5032 Chronic diastolic (congestive) heart failure: Secondary | ICD-10-CM | POA: Diagnosis not present

## 2021-11-30 DIAGNOSIS — F17210 Nicotine dependence, cigarettes, uncomplicated: Secondary | ICD-10-CM | POA: Diagnosis present

## 2021-11-30 DIAGNOSIS — Z9981 Dependence on supplemental oxygen: Secondary | ICD-10-CM | POA: Diagnosis not present

## 2021-11-30 DIAGNOSIS — R609 Edema, unspecified: Secondary | ICD-10-CM | POA: Diagnosis not present

## 2021-11-30 DIAGNOSIS — I639 Cerebral infarction, unspecified: Secondary | ICD-10-CM | POA: Diagnosis not present

## 2021-11-30 DIAGNOSIS — Z833 Family history of diabetes mellitus: Secondary | ICD-10-CM | POA: Diagnosis not present

## 2021-11-30 DIAGNOSIS — I160 Hypertensive urgency: Secondary | ICD-10-CM | POA: Diagnosis present

## 2021-11-30 DIAGNOSIS — N179 Acute kidney failure, unspecified: Secondary | ICD-10-CM | POA: Diagnosis present

## 2021-11-30 DIAGNOSIS — Z20822 Contact with and (suspected) exposure to covid-19: Secondary | ICD-10-CM | POA: Diagnosis present

## 2021-11-30 DIAGNOSIS — J3489 Other specified disorders of nose and nasal sinuses: Secondary | ICD-10-CM | POA: Diagnosis not present

## 2021-11-30 DIAGNOSIS — N183 Chronic kidney disease, stage 3 unspecified: Secondary | ICD-10-CM | POA: Diagnosis present

## 2021-11-30 DIAGNOSIS — I509 Heart failure, unspecified: Secondary | ICD-10-CM | POA: Diagnosis not present

## 2021-11-30 DIAGNOSIS — E1122 Type 2 diabetes mellitus with diabetic chronic kidney disease: Secondary | ICD-10-CM | POA: Diagnosis not present

## 2021-11-30 DIAGNOSIS — N271 Small kidney, bilateral: Secondary | ICD-10-CM | POA: Diagnosis not present

## 2021-11-30 DIAGNOSIS — E1151 Type 2 diabetes mellitus with diabetic peripheral angiopathy without gangrene: Secondary | ICD-10-CM | POA: Diagnosis present

## 2021-11-30 DIAGNOSIS — I517 Cardiomegaly: Secondary | ICD-10-CM | POA: Diagnosis not present

## 2021-11-30 DIAGNOSIS — R0600 Dyspnea, unspecified: Secondary | ICD-10-CM

## 2021-11-30 DIAGNOSIS — Z6841 Body Mass Index (BMI) 40.0 and over, adult: Secondary | ICD-10-CM

## 2021-11-30 DIAGNOSIS — Z7984 Long term (current) use of oral hypoglycemic drugs: Secondary | ICD-10-CM

## 2021-11-30 DIAGNOSIS — N184 Chronic kidney disease, stage 4 (severe): Secondary | ICD-10-CM | POA: Diagnosis not present

## 2021-11-30 DIAGNOSIS — Z9114 Patient's other noncompliance with medication regimen: Secondary | ICD-10-CM | POA: Diagnosis not present

## 2021-11-30 DIAGNOSIS — Z8673 Personal history of transient ischemic attack (TIA), and cerebral infarction without residual deficits: Secondary | ICD-10-CM | POA: Diagnosis not present

## 2021-11-30 DIAGNOSIS — Z8249 Family history of ischemic heart disease and other diseases of the circulatory system: Secondary | ICD-10-CM

## 2021-11-30 DIAGNOSIS — E1169 Type 2 diabetes mellitus with other specified complication: Secondary | ICD-10-CM

## 2021-11-30 DIAGNOSIS — Z79899 Other long term (current) drug therapy: Secondary | ICD-10-CM

## 2021-11-30 DIAGNOSIS — G319 Degenerative disease of nervous system, unspecified: Secondary | ICD-10-CM | POA: Diagnosis not present

## 2021-11-30 DIAGNOSIS — E669 Obesity, unspecified: Secondary | ICD-10-CM | POA: Diagnosis present

## 2021-11-30 LAB — COMPREHENSIVE METABOLIC PANEL
ALT: 13 U/L (ref 0–44)
AST: 12 U/L — ABNORMAL LOW (ref 15–41)
Albumin: 3 g/dL — ABNORMAL LOW (ref 3.5–5.0)
Alkaline Phosphatase: 100 U/L (ref 38–126)
Anion gap: 8 (ref 5–15)
BUN: 22 mg/dL (ref 8–23)
CO2: 24 mmol/L (ref 22–32)
Calcium: 7.9 mg/dL — ABNORMAL LOW (ref 8.9–10.3)
Chloride: 106 mmol/L (ref 98–111)
Creatinine, Ser: 2.35 mg/dL — ABNORMAL HIGH (ref 0.44–1.00)
GFR, Estimated: 22 mL/min — ABNORMAL LOW (ref >60)
Glucose, Bld: 120 mg/dL — ABNORMAL HIGH (ref 70–99)
Potassium: 3.6 mmol/L (ref 3.5–5.1)
Sodium: 138 mmol/L (ref 135–145)
Total Bilirubin: 0.6 mg/dL (ref 0.3–1.2)
Total Protein: 7 g/dL (ref 6.5–8.1)

## 2021-11-30 LAB — MAGNESIUM: Magnesium: 1.8 mg/dL (ref 1.7–2.4)

## 2021-11-30 LAB — CBC WITH DIFFERENTIAL/PLATELET
Abs Immature Granulocytes: 0.01 10*3/uL (ref 0.00–0.07)
Basophils Absolute: 0 10*3/uL (ref 0.0–0.1)
Basophils Relative: 0 %
Eosinophils Absolute: 0.1 10*3/uL (ref 0.0–0.5)
Eosinophils Relative: 1 %
HCT: 36.7 % (ref 36.0–46.0)
Hemoglobin: 11.5 g/dL — ABNORMAL LOW (ref 12.0–15.0)
Immature Granulocytes: 0 %
Lymphocytes Relative: 12 %
Lymphs Abs: 0.9 10*3/uL (ref 0.7–4.0)
MCH: 29.6 pg (ref 26.0–34.0)
MCHC: 31.3 g/dL (ref 30.0–36.0)
MCV: 94.6 fL (ref 80.0–100.0)
Monocytes Absolute: 0.4 10*3/uL (ref 0.1–1.0)
Monocytes Relative: 5 %
Neutro Abs: 6.1 10*3/uL (ref 1.7–7.7)
Neutrophils Relative %: 82 %
Platelets: 286 10*3/uL (ref 150–400)
RBC: 3.88 MIL/uL (ref 3.87–5.11)
RDW: 15.2 % (ref 11.5–15.5)
WBC: 7.5 10*3/uL (ref 4.0–10.5)
nRBC: 0 % (ref 0.0–0.2)

## 2021-11-30 LAB — BRAIN NATRIURETIC PEPTIDE: B Natriuretic Peptide: 664.5 pg/mL — ABNORMAL HIGH (ref 0.0–100.0)

## 2021-11-30 LAB — GLUCOSE, CAPILLARY: Glucose-Capillary: 120 mg/dL — ABNORMAL HIGH (ref 70–99)

## 2021-11-30 MED ORDER — HEPARIN SODIUM (PORCINE) 5000 UNIT/ML IJ SOLN
5000.0000 [IU] | Freq: Three times a day (TID) | INTRAMUSCULAR | Status: DC
  Administered 2021-11-30 – 2021-12-03 (×10): 5000 [IU] via SUBCUTANEOUS
  Filled 2021-11-30 (×10): qty 1

## 2021-11-30 MED ORDER — SODIUM CHLORIDE 0.9 % IV SOLN: 250.0000 mL | INTRAVENOUS | Status: DC | PRN

## 2021-11-30 MED ORDER — ACETAMINOPHEN 325 MG PO TABS
650.0000 mg | ORAL_TABLET | ORAL | Status: DC | PRN
  Administered 2021-11-30 – 2021-12-03 (×4): 650 mg via ORAL
  Filled 2021-11-30 (×4): qty 2

## 2021-11-30 MED ORDER — METOPROLOL SUCCINATE ER 50 MG PO TB24
50.0000 mg | ORAL_TABLET | Freq: Every day | ORAL | Status: DC
  Administered 2021-11-30 – 2021-12-03 (×4): 50 mg via ORAL
  Filled 2021-11-30 (×4): qty 1

## 2021-11-30 MED ORDER — FUROSEMIDE 10 MG/ML IJ SOLN
40.0000 mg | Freq: Once | INTRAMUSCULAR | Status: DC
  Filled 2021-11-30: qty 4

## 2021-11-30 MED ORDER — IRBESARTAN 75 MG PO TABS: 75.0000 mg | ORAL_TABLET | Freq: Every day | ORAL | Status: DC

## 2021-11-30 MED ORDER — POTASSIUM CHLORIDE CRYS ER 20 MEQ PO TBCR
40.0000 meq | EXTENDED_RELEASE_TABLET | Freq: Once | ORAL | Status: AC
  Administered 2021-11-30: 40 meq via ORAL
  Filled 2021-11-30: qty 2

## 2021-11-30 MED ORDER — INSULIN ASPART 100 UNIT/ML IJ SOLN: 0.0000 [IU] | Freq: Three times a day (TID) | INTRAMUSCULAR | Status: DC

## 2021-11-30 MED ORDER — ROSUVASTATIN CALCIUM 5 MG PO TABS
10.0000 mg | ORAL_TABLET | Freq: Every day | ORAL | Status: DC
  Administered 2021-12-01 – 2021-12-03 (×3): 10 mg via ORAL
  Filled 2021-11-30 (×3): qty 2

## 2021-11-30 MED ORDER — ASPIRIN EC 81 MG PO TBEC
81.0000 mg | DELAYED_RELEASE_TABLET | Freq: Every day | ORAL | Status: DC
  Administered 2021-12-01 – 2021-12-03 (×3): 81 mg via ORAL
  Filled 2021-11-30 (×3): qty 1

## 2021-11-30 MED ORDER — LABETALOL HCL 5 MG/ML IV SOLN: 10.0000 mg | INTRAVENOUS | Status: DC | PRN

## 2021-11-30 MED ORDER — SODIUM CHLORIDE 0.9% FLUSH: 3.0000 mL | INTRAVENOUS | Status: DC | PRN

## 2021-11-30 MED ORDER — SODIUM CHLORIDE 0.9% FLUSH
3.0000 mL | Freq: Two times a day (BID) | INTRAVENOUS | Status: DC
  Administered 2021-11-30 – 2021-12-03 (×6): 3 mL via INTRAVENOUS

## 2021-11-30 MED ORDER — SENNOSIDES-DOCUSATE SODIUM 8.6-50 MG PO TABS: 1.0000 | ORAL_TABLET | Freq: Every evening | ORAL | Status: DC | PRN

## 2021-11-30 MED ORDER — FUROSEMIDE 10 MG/ML IJ SOLN
40.0000 mg | Freq: Once | INTRAMUSCULAR | Status: AC
  Administered 2021-11-30: 40 mg via INTRAVENOUS

## 2021-11-30 MED ORDER — HYDRALAZINE HCL 25 MG PO TABS: 25.0000 mg | ORAL_TABLET | Freq: Three times a day (TID) | ORAL | Status: DC

## 2021-11-30 NOTE — H&P (Addendum)
NAME:  Alicia [name], MRN:  [id number], DOB:  August 31, 1951, LOS: 0 ADMISSION DATE:  11/30/2021, Primary: Alicia Duff, MD  CHIEF COMPLAINT:  lightheadedness, swelling  Medical Service: Internal Medicine Teaching Service         Attending Physician: Dr. Evette Doffing, Mallie Mussel, *    First Contact: Dr. Humphrey Rolls Pager: [phone]       Second Contact: Dr. Collene Gobble Pager: [phone]       After Hours (After 5p/  First Contact Pager: [phone]  weekends / holidays): Second Contact Pager: [place]  Zya Finkle is 71yo person with chronic diastolic heart failure, hyperlipidemia, hypertension, peripheral vascular disease, chronic respiratory failure on 2L supplemental oxygen, previous CVA, type II diabetes mellitus, class III obesity, chronic renal insufficiency presenting to [facility] from [facility] for lightheadedness and swelling. Patient presented to [facility] yesterday for regular follow-up appointment. She reports that many of her issues are chronic and has not noticed many acute changes. Notes some dyspnea over the last few days along with chronic orthopnea. She mentions having "heart problems" for a long time and stopped taking her lasix months ago due to lightheadedness. She has noticed her L leg has increased in size along with her arms, and face. Further notes this has been ongoing "for awhile." Also mentions diffuse pain in the L leg. No personal history of cancer or recent surgery. Patient does live alone and reports having a hard time taking care of herself lately. She has not weighed herself in months because her scale is out of batteries. She states that she was 250lb the last time she weighed herself. She denies chest pain, abdominal pain, nausea, vomiting, dysuria.  PCP: Alicia Duff, MD  ED COURSE  N/A - Patient directly admitted from [place]  She,  has a past medical history of Allergy, Arthritis, Chronic headaches,  Constipation, Depression, Diabetes mellitus, Hyperlipidemia, Hypertension, Obesity, PAD (peripheral artery disease) ([place]), Shortness of breath, Stage 3 chronic kidney disease ([place]), and Stroke ([place]).   HOME MEDICATIONS   Prior to Admission medications   Medication Sig Start Date End Date Taking? Authorizing Provider  acetaminophen (TYLENOL) 500 MG tablet Take 2 tablets (1,000 mg total) by mouth every 8 (eight) hours as needed for moderate pain or headache. 07/19/21   Mitzi Hansen, MD  aspirin 81 MG EC tablet Take 1 tablet (81 mg total) by mouth daily. 04/06/18   Daleen Squibb, MD  blood glucose meter kit and supplies per insurance preference. Check cbg once a day. Dx E11.9 01/06/20   Jacelyn Pi, Lilia Argue, MD  furosemide (LASIX) 40 MG tablet Take 1 tablet (40 mg total) by mouth 2 (two) times daily. 11/29/21   Alicia Duff, MD  hydrALAZINE (APRESOLINE) 25 MG tablet Take 1 tablet (25 mg total) by mouth 3 (three) times daily. 11/29/21   Alicia Duff, MD  irbesartan (AVAPRO) 75 MG tablet Take 1 tablet (75 mg total) by mouth daily. Take one daily 07/22/21   Maudie Mercury, MD  metFORMIN (GLUCOPHAGE) 1000 MG tablet Take 1 tablet (1,000 mg total) by mouth daily with breakfast for 14 days, THEN 1 tablet (1,000 mg total) 2 (two) times daily with a meal. For diabetes. 08/22/21 09/05/22  [name], MD  metoprolol succinate (TOPROL XL) 50 MG 24 hr tablet Take 1 tablet (50 mg total) by mouth daily. Take with or immediately following a meal. 07/22/21   Maudie Mercury, MD  NIFEdipine (PROCARDIA XL) 60  MG 24 hr tablet Take 1 tablet (60 mg total) by mouth daily. For high blood pressure 08/21/21 11/19/21  [name], MD  OXYGEN Inhale into the lungs.    [provider]  rosuvastatin (CRESTOR) 10 MG tablet Take 1 tablet (10 mg total) by mouth daily. 07/22/21   Maudie Mercury, MD  sitaGLIPtin (JANUVIA) 25 MG tablet Take 1 tablet (25 mg total) by mouth daily. 08/22/21 11/20/21  [name], MD     ALLERGIES   Allergies as of 11/29/2021 - Review Complete 11/29/2021  Allergen Reaction Noted   Atorvastatin Swelling 04/22/2019   Nifedipine er Other (See Comments) 04/03/2018   Latex Rash 04/03/2018    SOCIAL HISTORY  Patient is a retired Interior and spatial designer. Currently lives alone, although mentions she has been having difficulty taking care of herself. Reports 1/2 ppd for 50 years, occasional alcohol use, no drug use.  FAMILY HISTORY  Her family history includes Diabetes in her father; Heart disease in her brother and mother; Hypertension in her daughter, mother, and sister; Varicose Veins in her brother. There is no history of Colon cancer, Esophageal cancer, Rectal cancer, or Stomach cancer.   REVIEW OF SYSTEMS  ROS per history of present illness.  PHYSICAL EXAMINATION  Blood pressure (!) 208/98, pulse 78, temperature 98.8 F (37.1 C), temperature source Oral, height 5' 7"  (1.702 m), weight 124.6 kg, SpO2 98 %.    Filed Weights   11/30/21 1558 11/30/21 1647  Weight: 124.6 kg 124.6 kg   GENERAL: Resting comfortably in bed, no acute distress HENT: Normocephalic, atraumatic. CV: Regular rate, rhythm. No murmurs appreciated. Unable to appreciate JVD 2/2 body habitus. PULM: Distant breath sounds appreciated. Normal work of breathing on supplemental oxygen ABD:  Soft, non-tender, non-distended. Normoactive bowel sounds. Hernia present MSK: Normal bulk, tone. L leg circumferentially larger than R. 2+ pitting edema on L to knees. 1+ pitting edema R foot. SKIN: Warm, dry. No rashes or lesions appreciated. No erythema or warmth on L leg. NEURO: Awake, alert, conversing appropriately. No focal deficits appreciated. PSYCH: Normal mood, affect, speech.  SIGNIFICANT DIAGNOSTIC TESTS   ECG and chest x-ray are pending at the time this note is being written.  LABS   Labs are currently pending.  ASSESSMENT  Alicia [name] is 71yo person with chronic diastolic heart failure,  hyperlipidemia, hypertension, peripheral vascular disease, chronic respiratory failure on 2L supplemental oxygen, previous CVA, type II diabetes mellitus, class III obesity, chronic renal insufficiency admitted 1/10 for acute on chronic heart failure.  PLAN  Principal Problem:   Acute on chronic heart failure (HCC) Active Problems:   Diabetes mellitus type 2 in obese (HCC)   Morbid obesity (HCC)   CKD (chronic kidney disease) stage 3, GFR 30-59 ml/min (HCC)   Asymptomatic hypertensive urgency  #Acute on chronic diastolic heart failure On admission, patient afebrile, hypertensive to 208/98, sating well on 4L nasal cannula. She appears hypervolemic on exam with significant pitting edema. She is also having an increased oxygen requirement from baseline. Unfortunately, at this time we do not have laboratory work, ECG, or chest x-ray for further information. Most likely heart failure exacerbation 2/2 medication non-compliance. We will also explore further her diet at home, especially given she has had a declining functional status. She previously had Echo completed in August 2022 during previous admission for similar issue. At that time EF 55-60%, moderate concentric left ventricular hypertrophy, trivial mitral regurgitation, and mild/moderate aortic regurgitation. Current weight is 124.kg, previously was 121.8kg during clinic visit in September,  although she did have some edema on exam at that time. Given clinical picture, plan to initiate IV lasix 48m tonight for acute heart failure. Will hold off repeating Echo given recent study in August. - Start diuresis tonight, IV lasix 469m- Re-start home metoprolol succinate 2552maily - Holding olmesartan - see problem below - Daily weights, strict I/O - Daily BMP, Mg  #Severe asymptomatic hypertension Blood pressure significantly elevated on arrival, 208/98. Patient has previous history with uncontrolled hypertension. While in clinic yesterday BP  elevated 221/97. Per chart review, this has been a chronic issue for her. We will avoid dropping blood pressure too fast, especially given this is likely chronic in nature. Will have started her home metoprolol. After being given lasix, BP has improved to systolic 168902oal SBP 160 in next 24hr. - Re-start home metoprolol succinate 69m81mily - Consider re-starting other home antihypertensive meds in AM pending blood pressures overnight - Daily BMP  #Unilateral leg swelling Patient has had unilateral swelling in the past with negative DVT ultrasounds. On exam, her left leg is significantly more swollen than the right. It is painful, but no erythema or warmth noted. Will follow-up DVT ultrasound. - F/u DVT ultrasound  #Type II diabetes mellitus Last A1c 8.0% in August 2022. She takes sitagliptin and metformin at home. We will repeat A1c and continue with sliding scale. - Follow-up repeat A1c - SSI  #Chronic renal insufficiency Last creatinine 1.61 in clinic three months ago, likely close to her baseline. We are pending lab results. Will follow these overnight. - Follow-up CMP  #Symptomatic orthostasis Patient reports recent lightheadedness and dizziness. She reports feeling dizzy upon sitting up, but no when she is laying down or being still. We will obtain orthostatic vitals.  - Follow-up orthostatic vital signs  #Declining functional status Patient reports she has not been able to take care of herself lately. She currently lives alone. Will have physical therapy and occupational therapy follow-up with her, along with TOC. Suspect she likely could use a short-term rehabilitation facility. - PT/OT/TOC  #Hyperlipidemia Last LDL 77 in August 2022. Will continue home statin. - Rosuvastatin 10mg35mly  #Previous CVA without residual deficits No new deficits on exam. Will continue aspirin. - Aspirin 81mg 65my  BEST PRACTICE  DIET: HH IVF: n/a DVT PPX: heparin BOWEL: Senokot  PRN CODE: FULL FAM COM: n/a  DISPO: Admit patient to Inpatient with expected length of stay greater than 2 midnights.  PhilliSanjuan Damenternal Medicine Resident PGY-2 PAGER: [phone] 5:32 PM  If after hours (below), please contact on-call pager: [phone] Monday-Friday 1PM-7AM Saturday-Sunday

## 2021-11-30 NOTE — Hospital Course (Signed)
Dizziness for a couple years  Not sure of heart problem  Dyspnea, been awhile, not all the time, does note it when she gets up and does things  Orthopnea, no pnd  No chest pain  Does wear oxygen at home (2L)  Swelling in legs, been for a couple of months  Unilateral swelling  253lb

## 2021-11-30 NOTE — Progress Notes (Signed)
° °  CC: DOE, balance difficulties  HPI:  [name] is a 71 y.o. F with PMH per below. Please see encounters tab and problem based charting for further details.   Past Medical History:  Diagnosis Date   Allergy    Arthritis    Chronic headaches    Constipation    Depression    Diabetes mellitus    Hyperlipidemia    Hypertension    Obesity    PAD (peripheral artery disease) (HCC)    Shortness of breath    Stage 3 chronic kidney disease ([place])    Stroke ([place])    Review of Systems:  Please see encounters tab and problem based charting for further details.   Physical Exam:  Vitals:   11/29/21 1403 11/29/21 1419 11/29/21 1623  BP: (!) 186/88 (!) 216/84 (!) 221/97  Pulse: 82 79 73  Temp: 98.8 F (37.1 C)    TempSrc: Oral    SpO2: 95%  100%  Weight: 281 lb 9.6 oz (127.7 kg)    Height: 5\' 7"  (1.702 m)      Physical Exam  Constitutional: well-developed, well-nourished, and in no distress.  HENT:  Head: Normocephalic and atraumatic.  Eyes: EOM are normal.  Neck: Normal range of motion.  Cardiovascular: Normal rate, regular rhythm, normal heart sounds and intact distal pulses. Exam reveals no gallop and no friction rub.  No murmur heard, 2+ pitting edema to the knee of the LLE, 1+ to the knee of RLE Pulmonary/Chest: Mild increased WOB, rales approximately 3/4 up the lung fields, no wheezing, on 2L [place] Abdominal: Soft. Bowel sounds are normal. Non distended, non tender to palpation Musculoskeletal:     Neurological: alert and oriented to person, place, and time. CN 2-12 intact, difficulty with going from seated to standing position due to hip extension weakness, mild forward sway with romberg, and wide based gait. Bilateral knee extension/flexion 4/5. FTN within normal limits though R limited due to R shoulder pain,  Skin: Skin is warm and dry.    Assessment & Plan:   See Encounters Tab for problem based charting.  Patient seen with Dr. Jimmye Norman

## 2021-11-30 NOTE — TOC Progression Note (Signed)
Transition of Care [facility]) - Progression Note    Patient Details  Name: KARNA ABED MRN: [id number] Date of Birth: 1951-09-12  Transition of Care [facility]) CM/SW Contact  Zenon Mayo, RN Phone Number: 11/30/2021, 4:27 PM  Clinical Narrative:     Transition of Care [facility]) Screening Note   Patient Details  Name: AMAYIA CIANO Date of Birth: [birth year]   Transition of Care [facility]) CM/SW Contact:    Zenon Mayo, RN Phone Number: 11/30/2021, 4:27 PM    Transition of Care Department [facility]) has reviewed patient and no TOC needs have been identified at this time. We will continue to monitor patient advancement through interdisciplinary progression rounds. If new patient transition needs arise, please place a TOC consult.          Expected Discharge Plan and Services                                                 Social Determinants of Health (SDOH) Interventions    Readmission Risk Interventions Readmission Risk Prevention Plan 03/02/2020  Transportation Screening Complete  PCP or Specialist Appt within 5-7 Days Complete  Home Care Screening Complete  Medication Review (RN CM) Complete  Some recent data might be hidden

## 2021-12-01 ENCOUNTER — Inpatient Hospital Stay (HOSPITAL_COMMUNITY): Payer: Medicare HMO

## 2021-12-01 DIAGNOSIS — R609 Edema, unspecified: Secondary | ICD-10-CM

## 2021-12-01 DIAGNOSIS — M79605 Pain in left leg: Secondary | ICD-10-CM

## 2021-12-01 DIAGNOSIS — R42 Dizziness and giddiness: Secondary | ICD-10-CM | POA: Insufficient documentation

## 2021-12-01 DIAGNOSIS — I509 Heart failure, unspecified: Secondary | ICD-10-CM

## 2021-12-01 LAB — GLUCOSE, CAPILLARY
Glucose-Capillary: 98 mg/dL (ref 70–99)
Glucose-Capillary: 111 mg/dL — ABNORMAL HIGH (ref 70–99)
Glucose-Capillary: 119 mg/dL — ABNORMAL HIGH (ref 70–99)
Glucose-Capillary: 108 mg/dL — ABNORMAL HIGH (ref 70–99)

## 2021-12-01 LAB — BASIC METABOLIC PANEL
Anion gap: 12 (ref 5–15)
BUN: 24 mg/dL — ABNORMAL HIGH (ref 8–23)
CO2: 25 mmol/L (ref 22–32)
Calcium: 7.6 mg/dL — ABNORMAL LOW (ref 8.9–10.3)
Chloride: 105 mmol/L (ref 98–111)
Creatinine, Ser: 2.49 mg/dL — ABNORMAL HIGH (ref 0.44–1.00)
GFR, Estimated: 20 mL/min — ABNORMAL LOW (ref >60)
Glucose, Bld: 88 mg/dL (ref 70–99)
Potassium: 4 mmol/L (ref 3.5–5.1)
Sodium: 142 mmol/L (ref 135–145)

## 2021-12-01 LAB — HEMOGLOBIN A1C
Hgb A1c MFr Bld: 6.4 % — ABNORMAL HIGH (ref 4.8–5.6)
Mean Plasma Glucose: 136.98 mg/dL

## 2021-12-01 MED ORDER — LORAZEPAM 2 MG/ML IJ SOLN
0.5000 mg | Freq: Once | INTRAMUSCULAR | Status: AC
  Administered 2021-12-01: 0.5 mg via INTRAVENOUS
  Filled 2021-12-01: qty 1

## 2021-12-01 MED ORDER — INSULIN ASPART 100 UNIT/ML IJ SOLN: 0.0000 [IU] | Freq: Every day | INTRAMUSCULAR | Status: DC

## 2021-12-01 MED ORDER — FUROSEMIDE 10 MG/ML IJ SOLN
40.0000 mg | Freq: Once | INTRAMUSCULAR | Status: AC
  Administered 2021-12-01: 40 mg via INTRAVENOUS
  Filled 2021-12-01: qty 4

## 2021-12-01 MED ORDER — INSULIN ASPART 100 UNIT/ML IJ SOLN
0.0000 [IU] | Freq: Three times a day (TID) | INTRAMUSCULAR | Status: DC
  Administered 2021-12-02: 2 [IU] via SUBCUTANEOUS
  Administered 2021-12-03: 4 [IU] via SUBCUTANEOUS

## 2021-12-01 MED ORDER — NIFEDIPINE ER OSMOTIC RELEASE 60 MG PO TB24
60.0000 mg | ORAL_TABLET | Freq: Every day | ORAL | Status: DC
  Administered 2021-12-01 – 2021-12-03 (×3): 60 mg via ORAL
  Filled 2021-12-01 (×4): qty 1

## 2021-12-01 MED ORDER — DICLOFENAC SODIUM 1 % EX GEL
2.0000 g | Freq: Three times a day (TID) | CUTANEOUS | Status: DC | PRN
  Administered 2021-12-02: 2 g via TOPICAL
  Filled 2021-12-01: qty 100

## 2021-12-01 NOTE — Progress Notes (Signed)
Alicia [name] is 71yo person with chronic diastolic heart failure, hyperlipidemia, hypertension, peripheral vascular disease, chronic respiratory failure on 2L supplemental oxygen, previous CVA, type II diabetes mellitus, class III obesity, chronic renal insufficiency admitted 1/10 for acute on chronic heart failure. Subjective:  Overnight:NAEON  Stated she had lightheadedness that is chronic for her. She endorsed a declining functional status. States she takes her medicines but may miss one day a week. Denied any acute concerns.    Objective:  Vital signs in last 24 hours: Vitals:   11/30/21 1647 11/30/21 1855 11/30/21 1945 12/01/21 0352  BP:  (!) 216/88 (!) 168/82 (!) 176/67  Pulse:   71 74  Resp:   20 20  Temp:   98.9 F (37.2 C) 99.4 F (37.4 C)  TempSrc:   Oral Oral  SpO2:   100% 100%  Weight: 124.6 kg   123.9 kg  Height:       Supplemental O2: Nasal Cannula SpO2: 100 % O2 Flow Rate (L/min): 2 L/min Physical Exam  General:NAD Head: Normocephalic without scalp lesions.  Mouth and Throat: Lips normal color, without lesions. Moist mucus membrane. Neck: Neck supple with full range of motion (ROM). No masses or tenderness. No jugular venous distension appreciated. Lungs: CTAB, no wheeze, rhonchi or rales.  Cardiovascular: Normal heart sounds, no r/m/g,. 2+ LE edema on left and trace on left.  Abdomen: No TTP, normal bowel sounds SWF:UXNA leg with increased swelling compared to the left.  Skin: warm, dry good skin turgor Neuro: Alert and oriented. CN grossly intact Psych: Normal mood and normal affect  Filed Weights   11/30/21 1558 11/30/21 1647 12/01/21 0352  Weight: 124.6 kg 124.6 kg 123.9 kg     Intake/Output Summary (Last 24 hours) at 12/01/2021 0710 Last data filed at 12/01/2021 0356 Gross per 24 hour  Intake 480 ml  Output 700 ml  Net -220 ml   Net IO Since Admission: -220 mL [12/01/21 0710]  Assessment/Plan: Alicia [name] is 71yo person with  chronic diastolic heart failure, hyperlipidemia, hypertension, peripheral vascular disease, chronic respiratory failure on 2L supplemental oxygen, previous CVA, type II diabetes mellitus, class III obesity, chronic renal insufficiency admitted 1/10 for acute on chronic heart failure.  Acute chronic diastolic HF  Patient appeared volume overloaded on exam today. IV diuresis with lasix 40 mg resulted in 700 cc yesterday. Plan is to continue lasix IV lasix today to see if there is a response.  -IV lasix 40 mg, continue metoprolol succinate 50 mg qd, nicardipine 60 mg XR -Will consider adding Chlorthalidone 25 mg to improve diuresis and help with decreasing bp -Stricts weights and I/Os   Severe asymptomatic HTN Hx of HTN Presented BP of >200/90. Last bp 180/80s. Currently asymptomatic.  -Continue metoprolol succinate 50 mg qd, nicardipine 60 mg XR, and IV lasix 40 mg as needed for volume overload. -CTM bp and add chlorthalidone 25 mg if uncontrolled by tomorrow.   Left LE swelling 2/2 to HF vs Venous insufficiency vs lymphedema Was concerning for DVT but vascular ultrasound came back negative for DVT. She does have bilateral pitting edema but worse on the left than right. Could be early stages of lymphedema. -Continue IV lasix and see monitor for improvement.  -CTM  T2DM Repeat A1c was 6.4 from 8.0 in 06/2021.  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 Baseline creatinine around 1.8-1.9. With uncontrolled htn can not rule out progression of CKD.  -CTM -Avoid nephrotoxins including NSAIDs  and iodinated intravenous contrast exposure unless the latter is absolutely indicated.    Declining functional status/lightheadedness MRI did not show any acute findings to suggest a stroke. Would need to better characterize her dizziness/lightheadedness. But given her htn, orthostatic hypotension is less likely. This could be secondary to her deconditioning.  -Continue PT/OT -CTM    HLD/Hx of CVA Chronic.  -Continue Crestor 10 mg daily -Continue ASA 81 mg  Diet: Heart Healthy IVF: None,None VTE: Heparin Code: Full Prior to Admission Living Arrangement:Home Anticipated Discharge Location:Home Barriers to Discharge:Medical Stability Dispo: Anticipated discharge in approximately 1-2 day(s).   Alicia Schuller, MD [name]. [facility] Internal Medicine Residency, PGY-1  Pager: [phone] After 5 pm and on weekends: Please call the on-call pager

## 2021-12-01 NOTE — Evaluation (Signed)
Physical Therapy Evaluation Patient Details Name: Alicia [name] MRN: [id number] DOB: [birth year] Today's Date: 12/01/2021  History of Present Illness  Pt is a 71 y.o. female who presented 12/10/21 from [facility] for lightheadedness and swelling. Pt admitted with acute on chronic diastolic heart failure. PMH: chronic diastolic heart failure, hyperlipidemia, hypertension, peripheral vascular disease, chronic respiratory failure on 2L supplemental oxygen, previous CVA, type II diabetes mellitus, class III obesity, chronic renal insufficiency   Clinical Impression  Pt presented with condition above and deficits mentioned below, see PT Problem List. PTA, she was mod I using a quad-cane for mobility, living alone in an apartment with 2 STE. Pt with possible L leg DVT but started on heparin yesterday with RN clearing pt for PT eval this date. Pt limited in mobility by lightheadedness with standing and attempts to take steps today, see General Comments below in regards to BP and SpO2 levels. She displays deficits in activity tolerance, balance, and strength. Currently, pt is able to mobilize at a min guard assist level with a RW without LOB, and I suspect once her lightheadedness is addressed she will make good progress with mobility. Hopeful that pt can d/c home with HHPT, but if she does not progress as anticipated she may benefit from going to a SNF for short-term rehab. Will continue to follow acutely.       Recommendations for follow up therapy are one component of a multi-disciplinary discharge planning process, led by the attending physician.  Recommendations may be updated based on patient status, additional functional criteria and insurance authorization.  Follow Up Recommendations Home health PT (provided makes anticipated progress, otherwise may need SNF)    Assistance Recommended at Discharge Intermittent Supervision/Assistance  Patient can return home with the following  A little help with  walking and/or transfers;Help with stairs or ramp for entrance    Equipment Recommendations Wheelchair (measurements PT);Wheelchair cushion (measurements PT) (pending progress)  Recommendations for Other Services       Functional Status Assessment Patient has had a recent decline in their functional status and demonstrates the ability to make significant improvements in function in a reasonable and predictable amount of time.     Precautions / Restrictions Precautions Precautions: Fall;Other (comment) Precaution Comments: monitor SpO2 Restrictions Weight Bearing Restrictions: No      Mobility  Bed Mobility Overal bed mobility: Needs Assistance Bed Mobility: Supine to Sit;Sit to Supine     Supine to sit: Min assist;HOB elevated Sit to supine: Min guard   General bed mobility comments: Pt requesting PT's hand to ascend trunk to sit up R EOB 1x and sit up L EOB 1x, minA. Min guard to return to supine.    Transfers Overall transfer level: Needs assistance Equipment used: Rolling walker (2 wheels) Transfers: Sit to/from Stand;Bed to chair/wheelchair/BSC Sit to Stand: Min guard   Step pivot transfers: Min guard       General transfer comment: Min guard for safety with coming to stand from EOB 1x, 1x stand setp to L bed > recliner. No LOB.    Ambulation/Gait Ambulation/Gait assistance: Min guard Gait Distance (Feet): 4 Feet Assistive device: Rolling walker (2 wheels) Gait Pattern/deviations: Step-through pattern;Decreased stride length Gait velocity: reduced Gait velocity interpretation: <1.31 ft/sec, indicative of household ambulator   General Gait Details: Pt with slow, fairly steady gait stepping to L bed > recliner, no LOB, min guard for safety. Limited mobility to short bout bed > recliner due to suspected possible DVT in L leg (started on  heparin 1/10 and cleared by RN to participate with PT) and due to lightheadness with standing.  Stairs             Wheelchair Mobility    Modified Rankin (Stroke Patients Only)       Balance Overall balance assessment: Needs assistance Sitting-balance support: No upper extremity supported;Feet supported Sitting balance-Leahy Scale: Fair Sitting balance - Comments: Static sitting EOB with supervision for safety.   Standing balance support: Reliant on assistive device for balance Standing balance-Leahy Scale: Poor Standing balance comment: Reliant on RW                             Pertinent Vitals/Pain Pain Assessment: No/denies pain    Home Living Family/patient expects to be discharged to:: Private residence Living Arrangements: Alone Available Help at Discharge:  (none) Type of Home: Apartment Home Access: Stairs to enter Entrance Stairs-Rails: Right;Left Entrance Stairs-Number of Steps: 2   Home Layout: One level Home Equipment: Curator (4 wheels);Rolling Walker (2 wheels);Cane - quad;Cane - single point Additional Comments: Uses supplemental O2 intermittently up to 2 L PRN    Prior Function Prior Level of Function : Independent/Modified Independent;Driving             Mobility Comments: Uses quad-cane majority of time, and intermittently uses RW when not feeling well. ADLs Comments: Mod I for ADLs, uses slippers not socks     Hand Dominance   Dominant Hand: Right    Extremity/Trunk Assessment   Upper Extremity Assessment Upper Extremity Assessment: Defer to OT evaluation    Lower Extremity Assessment Lower Extremity Assessment: Generalized weakness    Cervical / Trunk Assessment Cervical / Trunk Assessment: Normal  Communication   Communication: No difficulties  Cognition Arousal/Alertness: Awake/alert Behavior During Therapy: WFL for tasks assessed/performed Overall Cognitive Status: Within Functional Limits for tasks assessed                                          General Comments General comments  (skin integrity, edema, etc.): Orthostatics taken by NT directly before session with results of: 184/88 supine, 195/81 sitting, 176/79 standing; BP 173/80 sitting after standing and taking steps to transfer to chair; lightheadedness noted with standing bouts; SpO2 89% at rest on RA, as low as 85% on RA when mobilizing, >/= 88% on 2L when mobilizing    Exercises     Assessment/Plan    PT Assessment Patient needs continued PT services  PT Problem List Decreased strength;Decreased activity tolerance;Decreased balance;Decreased mobility;Cardiopulmonary status limiting activity       PT Treatment Interventions DME instruction;Gait training;Stair training;Functional mobility training;Therapeutic activities;Therapeutic exercise;Balance training;Neuromuscular re-education;Patient/family education    PT Goals (Current goals can be found in the Care Plan section)  Acute Rehab PT Goals Patient Stated Goal: to get better and go home PT Goal Formulation: With patient Time For Goal Achievement: 12/15/21 Potential to Achieve Goals: Good    Frequency Min 3X/week     Co-evaluation               AM-PAC PT "6 Clicks" Mobility  Outcome Measure Help needed turning from your back to your side while in a flat bed without using bedrails?: A Little Help needed moving from lying on your back to sitting on the side of a flat bed without using bedrails?: A Little Help  needed moving to and from a bed to a chair (including a wheelchair)?: A Little Help needed standing up from a chair using your arms (e.g., wheelchair or bedside chair)?: A Little Help needed to walk in hospital room?: A Little (if not limited by lightheadedness) Help needed climbing 3-5 steps with a railing? : A Little 6 Click Score: 18    End of Session Equipment Utilized During Treatment: Oxygen Activity Tolerance: Patient tolerated treatment well Patient left: in chair;with call bell/phone within reach;with chair alarm set Nurse  Communication: Mobility status PT Visit Diagnosis: Unsteadiness on feet (R26.81);Other abnormalities of gait and mobility (R26.89);Muscle weakness (generalized) (M62.81);Difficulty in walking, not elsewhere classified (R26.2);Dizziness and giddiness (R42)    Time: 1007-1219 PT Time Calculation (min) (ACUTE ONLY): 20 min   Charges:   PT Evaluation $PT Eval Moderate Complexity: 1 Mod          Moishe Spice, PT, DPT Acute Rehabilitation Services  Pager: [phone] Office: [phone]   Orvan Falconer 12/01/2021, 10:50 AM

## 2021-12-01 NOTE — Consult Note (Signed)
° °  [facility] CM Inpatient Consult   12/01/2021  JAYCE BOYKO 29-Jul-1951 [id number]  [facility] [facility] [ACO] Patient: Alicia [name]  Primary Care Provider:  Rick Duff, MD is listed as PCP, is an embedded provider with a Chronic Care Management team and program, and is listed for the transition of care follow up and appointments.  Met with patient at the bedside.  Patient confirms an appointment at [facility] and states, "that didn't work out so I plan to keep Internal Medicine for my primary." Patient is a bit short of breath noted.  Patient was screened for Embedded practice service needs for chronic care management.  Patient is currently being followed for progress.  Plan: Can make a referral to the [place] [facility] and made aware of TOC needs for post hospital needs.  Please contact for further questions,  Natividad Brood, RN BSN [facility] Liaison  [phone] business mobile phone Toll free office [phone]  Fax number: [phone] [email or web address]

## 2021-12-01 NOTE — Assessment & Plan Note (Addendum)
Patient presented to clinic with SBP >200 and DBP>100. In addition she notes increased SOB and was noted to have increased WOB when attempting to get on scale at clinic with desaturations into the low 80s requiring placement of 2L [place]. Patient's oral antihypertensive regimen includes metoprolol succinate 50mg , nifedipine 60mg , irbesartan 75mg  daily. She has been adherent to these medications. Patient however was hospitalized 06/2021 for elevated blood pressures. She was told to discontinue her home chlorthalidone and continue oral lasix. At follow up appointment in our clinic 07/2021 patient was instructed to resume her chlorthalidone and stop her lasix for better blood pressure control. Unfortunately, patient discontinued both medications at that time and has been without the additional antihypertensive since 9/29. Patient did see another provider 10/7 at the family medicine clinic and was told to resume chlorthalidone per the note. However, this medication was discontinued by their office. Patient was given a 7 d course of PO lasix during this time and a single dose of clonidine which was to be administered in clinic.   As a result of patient not being on the additional antihypertensive she has uncontrolled blood pressure which has likely lead to HFpEf exacerbation. Attempted to get patient started on treatment in the ED but it was uncllear exactly how long she would have to wait. Instead, reserved bed for patient and placed order for patient to start hydralazine 25mg  TID PO, lasix 40mg  BID PO, and resume her home chlorthalidone.   I called patient 1/10 PM and she was able to pick up these medications and take them. She had a blood pressure cuff but she kept getting an error message. Thankfully she was able to get a hospital bed 1/10 PM and was admitted to the hospital for diuresis and blood pressure control.

## 2021-12-01 NOTE — Progress Notes (Signed)
Left lower extremity venous duplex completed. Refer to "CV Proc" under chart review to view preliminary results.  12/01/2021 10:31 AM Kelby Aline., MHA, RVT, RDCS, RDMS

## 2021-12-01 NOTE — Progress Notes (Addendum)
OT Cancellation Note  Patient Details Name: CAELEY DOHRMANN MRN: [id number] DOB: 10-14-1951   Cancelled Treatment:    Reason Eval/Treat Not Completed: Patient at procedure or test/ unavailable;Other (comment) (MRI). Plan to reattempt.   Tyrone Schimke, [place] Acute Rehabilitation Services Office: [phone]  12/01/2021, 10:28 AM

## 2021-12-01 NOTE — TOC Progression Note (Addendum)
Transition of Care [facility]) - Progression Note    Patient Details  Name: Alicia [name] MRN: [id number] Date of Birth: Mar 13, 1951  Transition [facility]) CM/SW Contact  Zenon Mayo, RN Phone Number: 12/01/2021, 2:58 PM  Clinical Narrative:    NCM spoke with patient at bedside, she states she is on home oxygen with [employer] (2) liters, she states she also has walker, and shower chair. She has the quad  cane at bedside.  She states she has a PCP that she saw two weeks ago but can't remember the name.  NCM offered choice, she states she does not have a preference but she thinks she is active with [facility] right now.  NCM left vm with Tommi Rumps with [name] to check to see if she is active with them.  He states patient is good for referral for HHPT, [place].  Soc will begin 24 to 48 hrs post dc.  This is if she goes home with [facility].        Expected Discharge Plan and Services                                                 Social Determinants of Health (SDOH) Interventions    Readmission Risk Interventions Readmission Risk Prevention Plan 03/02/2020  Transportation Screening Complete  PCP or Specialist Appt within 5-7 Days Complete  Home Care Screening Complete  Medication Review (RN CM) Complete  Some recent data might be hidden

## 2021-12-01 NOTE — Assessment & Plan Note (Signed)
Patient with HFpEF exacerbation in the setting of uncontrolled blood pressure. Echo 06/2021 with EF 55-60% and LVH, some aortic valve sclerosis but no stenosis. Patient will likely need repeat ECHO. In addition to uncontrolled blood pressure she has risk factors for COPD though she has never been diagnosed (45 pack year smoking history and current smoker). And she has risk factors for OSA though she has never been diagnosed. On exam noted to have rales and LE edema -Oral diuresis and antihypertensive until patient can be admitted -Consider repeat echo -Will set up patient for PFTs, sleep study once stabilized

## 2021-12-01 NOTE — Evaluation (Signed)
Occupational Therapy Evaluation Patient Details Name: Alicia [name] MRN: [id number] DOB: [birth year] Today's Date: 12/01/2021   History of Present Illness Pt is a 71 y.o. female who presented 12/10/21 from [facility] for lightheadedness and swelling. Pt admitted with acute on chronic diastolic heart failure. PMH: chronic diastolic heart failure, hyperlipidemia, hypertension, peripheral vascular disease, chronic respiratory failure on 2L supplemental oxygen, previous CVA, type II diabetes mellitus, class III obesity, chronic renal insufficiency   Clinical Impression   Pt admitted with the above diagnoses and presents with below problem list. Pt will benefit from continued acute OT to address the below listed deficits and maximize independence with basic ADLs prior to d/c home. At baseline, pt is mod I with ADLs, endorses intermittent use of supplemental O2 "depending on how I'm feeling." Pt received sitting at sink completing UB/LB bathing with NT present. Pt noted to have removed her [place] and verbally indicated she didn't feel like she needed it. SpO2 assessed at 85, [place] reapplied at 2L with SpO2 95. Educated on need for supplemental O2. Pt currently needs min guard assist with functional transfers and mobility, LB ADLs.        Recommendations for follow up therapy are one component of a multi-disciplinary discharge planning process, led by the attending physician.  Recommendations may be updated based on patient status, additional functional criteria and insurance authorization.   Follow Up Recommendations  Home health OT, pending progress   Assistance Recommended at Discharge Intermittent Supervision/Assistance  Patient can return home with the following A little help with bathing/dressing/bathroom;A little help with walking and/or transfers    Functional Status Assessment  Patient has had a recent decline in their functional status and demonstrates the ability to make significant improvements in  function in a reasonable and predictable amount of time.  Equipment Recommendations  None recommended by OT    Recommendations for Other Services       Precautions / Restrictions Precautions Precautions: Fall;Other (comment) Precaution Comments: monitor SpO2 Restrictions Weight Bearing Restrictions: No      Mobility Bed Mobility               General bed mobility comments: OOB and EOB at start and end of session    Transfers Overall transfer level: Needs assistance Equipment used: Quad cane (vs single UE support of furniture) Transfers: Sit to/from Stand Sit to Stand: Min guard           General transfer comment: Cues for taking the time she needs to complete transfers/mobility safely. Pt with increased speed to go from [facility] at sink to EOB to receive phone call from daughter.      Balance Overall balance assessment: Needs assistance Sitting-balance support: No upper extremity supported;Feet supported Sitting balance-Leahy Scale: Fair Sitting balance - Comments: completed UB bathing seated on BSC at sink   Standing balance support: Single extremity supported;Reliant on assistive device for balance Standing balance-Leahy Scale: Poor Standing balance comment: reliant on external support                           ADL either performed or assessed with clinical judgement   ADL Overall ADL's : Needs assistance/impaired Eating/Feeding: Set up   Grooming: Set up;Sitting   Upper Body Bathing: Set up;Sitting   Lower Body Bathing: Min guard;Sit to/from stand;Set up;Sitting/lateral leans   Upper Body Dressing : Set up;Sitting   Lower Body Dressing: Set up;Min guard;Sitting/lateral leans;Sit to/from stand   Toilet Transfer: Min  guard;Ambulation   Toileting- Clothing Manipulation and Hygiene: Min guard;Set up;Sitting/lateral lean;Sit to/from stand   Tub/ Shower Transfer: Min guard   Functional mobility during ADLs: Min guard;Cane;Rolling walker (2  wheels) General ADL Comments: Pt received from NT while working on UB/LB bathing at sink. Pt noted to be on RA and did not seem to think she needed her O2. SpO2 assessed at 85 and [place] reapplied at 2L/min, SpO2 95 on 2L. Educated on need for supplemental O2.     Vision         Perception     Praxis      Pertinent Vitals/Pain Pain Assessment: No/denies pain     Hand Dominance Right   Extremity/Trunk Assessment Upper Extremity Assessment Upper Extremity Assessment: Overall WFL for tasks assessed;Generalized weakness   Lower Extremity Assessment Lower Extremity Assessment: Defer to PT evaluation       Communication Communication Communication: No difficulties   Cognition Arousal/Alertness: Awake/alert Behavior During Therapy: WFL for tasks assessed/performed Overall Cognitive Status: Within Functional Limits for tasks assessed                                       General Comments       Exercises     Shoulder Instructions      Home Living Family/patient expects to be discharged to:: Private residence Living Arrangements: Alone   Type of Home: Apartment Home Access: Stairs to enter Entrance Stairs-Number of Steps: 2 Entrance Stairs-Rails: Right;Left Home Layout: One level     Bathroom Shower/Tub: Teacher, early years/pre: Standard     Home Equipment: Curator (4 wheels);Rolling Walker (2 wheels);Cane - quad;Cane - single point   Additional Comments: Uses supplemental O2 intermittently up to 2 L PRN      Prior Functioning/Environment Prior Level of Function : Independent/Modified Independent;Driving             Mobility Comments: Uses quad-cane majority of time, and intermittently uses RW when not feeling well. ADLs Comments: Mod I for ADLs, uses slippers not socks        OT Problem List: Decreased strength;Decreased activity tolerance;Impaired balance (sitting and/or standing);Decreased knowledge of  use of DME or AE;Decreased knowledge of precautions;Cardiopulmonary status limiting activity;Obesity      OT Treatment/Interventions: Self-care/ADL training;DME and/or AE instruction;Energy conservation;Balance training;Patient/family education;Therapeutic activities;Therapeutic exercise    OT Goals(Current goals can be found in the care plan section) Acute Rehab OT Goals Patient Stated Goal: feel better, not need supplemental O2 OT Goal Formulation: With patient Time For Goal Achievement: 12/15/21 Potential to Achieve Goals: Good ADL Goals Pt Will Perform Lower Body Bathing: with modified independence;sit to/from stand Pt Will Perform Lower Body Dressing: with modified independence;sit to/from stand Pt Will Transfer to Toilet: with modified independence;ambulating Pt Will Perform Toileting - Clothing Manipulation and hygiene: with modified independence;sit to/from stand  OT Frequency: Min 2X/week    Co-evaluation              AM-PAC OT "6 Clicks" Daily Activity     Outcome Measure Help from another person eating meals?: None Help from another person taking care of personal grooming?: None Help from another person toileting, which includes using toliet, bedpan, or urinal?: A Little Help from another person bathing (including washing, rinsing, drying)?: A Little Help from another person to put on and taking off regular upper body clothing?: None Help from another person  to put on and taking off regular lower body clothing?: A Little 6 Click Score: 21   End of Session Equipment Utilized During Treatment: Oxygen;Other (comment) (furntiure support, pt walking briskly back to EOB to receive phone call from daughter)  Activity Tolerance: Other (comment) (DOE 1/4, SpO2 85 on RA) Patient left: in bed;with call bell/phone within reach;with bed alarm set;Other (comment) (sitting EOB)  OT Visit Diagnosis: Unsteadiness on feet (R26.81);Muscle weakness (generalized) (M62.81)                 Time: 1164-3539 OT Time Calculation (min): 15 min Charges:  OT General Charges $OT Visit: 1 Visit OT Evaluation $OT Eval Low Complexity: [place], OT Acute Rehabilitation Services Office: [phone]   Hortencia Pilar 12/01/2021, 1:32 PM

## 2021-12-01 NOTE — Progress Notes (Signed)
°   12/01/21 0939  Orthostatic Lying   BP- Lying 184/88  Pulse- Lying 69  Orthostatic Sitting  BP- Sitting 195/81  Pulse- Sitting 75  Orthostatic Standing at 0 minutes  BP- Standing at 0 minutes 176/79  Pulse- Standing at 0 minutes 79

## 2021-12-01 NOTE — Assessment & Plan Note (Signed)
Patient reports that for the past year she has been having difficulty getting around her house due to sensation that her balance is off. She states that when the pandemic started she stopped going to a lot of her exercise classes and became more sedentary. She developed some lower extremity weakness due to this. However, she feels that this is now getting worse. She feels she has to use more items in her home to help stabilize herself so she doesn't fall. She has a cane during today's clinic visit. She denies a sensation of the room spinning or moving. She has not blacked out. Patient had MRI in 2019 as she presented for stroke like symptoms, it showed Late subactue R micoronoa radiate infarct, chronic microvascular ischemic changes, parenchymal volume loss, and small chronic infarcts in R cerebellum, pons, and L basal ganglia. On exam she sways forward with romberg and has a wide stance. She has weakness with standing and hip flexion/extension. FTN WNL though R is limited due to shoulder pain.   A/P: Given patient reports worsening of her balance symptoms over the last year and the distribution of her CVAs on MRI from 2019 would recommend patient get repeat head imaging when admitted. In addition, patient will need aggressive PT/OT to help with her deconditioning. Also spoke with patient about smoking cessation. Will continue to address this at subsequent hospital visits.

## 2021-12-01 NOTE — Progress Notes (Signed)
SATURATION QUALIFICATIONS: (This note is used to comply with regulatory documentation for home oxygen)  Patient Saturations on Room Air at Rest = 89%  Patient Saturations on Room Air while Ambulating = 85%  Patient Saturations on 2 Liters of oxygen while Ambulating = 88%  Please briefly explain why patient needs home oxygen: Pt requires 2 L of supplemental O2 to maintain her SpO2 levels >/= 88% with mobility.  Moishe Spice, PT, DPT Acute Rehabilitation Services  Pager: [phone] Office: [phone]

## 2021-12-02 ENCOUNTER — Inpatient Hospital Stay (HOSPITAL_COMMUNITY): Payer: Medicare HMO

## 2021-12-02 DIAGNOSIS — Z8673 Personal history of transient ischemic attack (TIA), and cerebral infarction without residual deficits: Secondary | ICD-10-CM

## 2021-12-02 DIAGNOSIS — I5032 Chronic diastolic (congestive) heart failure: Secondary | ICD-10-CM | POA: Diagnosis not present

## 2021-12-02 DIAGNOSIS — E1122 Type 2 diabetes mellitus with diabetic chronic kidney disease: Secondary | ICD-10-CM | POA: Diagnosis not present

## 2021-12-02 DIAGNOSIS — I130 Hypertensive heart and chronic kidney disease with heart failure and stage 1 through stage 4 chronic kidney disease, or unspecified chronic kidney disease: Secondary | ICD-10-CM | POA: Diagnosis not present

## 2021-12-02 DIAGNOSIS — E785 Hyperlipidemia, unspecified: Secondary | ICD-10-CM

## 2021-12-02 DIAGNOSIS — N179 Acute kidney failure, unspecified: Secondary | ICD-10-CM | POA: Diagnosis not present

## 2021-12-02 DIAGNOSIS — N184 Chronic kidney disease, stage 4 (severe): Secondary | ICD-10-CM

## 2021-12-02 LAB — GLUCOSE, CAPILLARY
Glucose-Capillary: 105 mg/dL — ABNORMAL HIGH (ref 70–99)
Glucose-Capillary: 128 mg/dL — ABNORMAL HIGH (ref 70–99)
Glucose-Capillary: 108 mg/dL — ABNORMAL HIGH (ref 70–99)
Glucose-Capillary: 196 mg/dL — ABNORMAL HIGH (ref 70–99)

## 2021-12-02 LAB — BASIC METABOLIC PANEL
Anion gap: 6 (ref 5–15)
BUN: 22 mg/dL (ref 8–23)
CO2: 26 mmol/L (ref 22–32)
Calcium: 7.5 mg/dL — ABNORMAL LOW (ref 8.9–10.3)
Chloride: 106 mmol/L (ref 98–111)
Creatinine, Ser: 2.59 mg/dL — ABNORMAL HIGH (ref 0.44–1.00)
GFR, Estimated: 19 mL/min — ABNORMAL LOW (ref >60)
Glucose, Bld: 120 mg/dL — ABNORMAL HIGH (ref 70–99)
Potassium: 3.6 mmol/L (ref 3.5–5.1)
Sodium: 138 mmol/L (ref 135–145)

## 2021-12-02 MED ORDER — HYDRALAZINE HCL 25 MG PO TABS
25.0000 mg | ORAL_TABLET | Freq: Three times a day (TID) | ORAL | Status: DC
  Administered 2021-12-02 – 2021-12-03 (×3): 25 mg via ORAL
  Filled 2021-12-02 (×3): qty 1

## 2021-12-02 MED ORDER — FUROSEMIDE 10 MG/ML IJ SOLN
80.0000 mg | Freq: Once | INTRAMUSCULAR | Status: AC
  Administered 2021-12-02: 80 mg via INTRAVENOUS
  Filled 2021-12-02: qty 8

## 2021-12-02 NOTE — Plan of Care (Signed)

## 2021-12-02 NOTE — Progress Notes (Signed)
Mobility Specialist Progress Note:   12/02/21 1446  Mobility  Activity Ambulated in room  Level of Assistance Standby assist, set-up cues, supervision of patient - no hands on  Assistive Device Front wheel walker  Distance Ambulated (ft) 40 ft  Mobility Ambulated with assistance in room  Mobility Response Tolerated well  Mobility performed by Mobility specialist  $Mobility charge 1 Mobility   During Mobility: 82 HR Post Mobility:  63 HR  Pt received in bed willing to participate in mobility. No complaints of pain. Pt SOB and educated on pursed lip breathing. Pt left in bed with call bell in reach and all needs met.    [facility] Public librarian Phone [phone] Secondary Phone [phone]

## 2021-12-02 NOTE — Progress Notes (Signed)
Alicia [name] is 71yo person with chronic diastolic heart failure, hyperlipidemia, hypertension, peripheral vascular disease, chronic respiratory failure on 2L supplemental oxygen, previous CVA, type II diabetes mellitus, class III obesity, chronic renal insufficiency admitted 1/10 for acute on chronic heart failure. Subjective:  Overnight:NAEON  Denied any acute concerns. Stated her lightheadedness present when moving but stated no feeling of room spinning present. Overall feels improvement since admission.   Objective:  Vital signs in last 24 hours: Vitals:   12/01/21 1614 12/01/21 1713 12/01/21 1947 12/02/21 0400  BP: (!) 184/68 (!) 180/85 (!) 184/78 (!) 171/67  Pulse: 67 81 70 71  Resp:  18 18 20   Temp:   98.5 F (36.9 C) 98.6 F (37 C)  TempSrc:   Oral Oral  SpO2: 99% 99% 96% 99%  Weight:    124.5 kg  Height:       Supplemental O2: Nasal Cannula SpO2: 99 % O2 Flow Rate (L/min): 2 L/min Physical Exam General: NAD Head: Normocephalic without scalp lesions.  Mouth and Throat: Lips normal color, without lesions. Moist mucus membrane. Neck: Neck supple with full range of motion (ROM). Lungs: Decreased air movement at bases. No rales, wheeze or rhonchi appreciated.  Cardiovascular: Normal heart sounds, no r/m/g, 2+ radial pulses. +1 LLE edema. Abdomen: No TTP, normal bowel sounds Skin: no lesions noted Neuro: Alert and oriented. CN grossly intact Psych: Normal mood and normal affect    Filed Weights   11/30/21 1647 12/01/21 0352 12/02/21 0400  Weight: 124.6 kg 123.9 kg 124.5 kg     Intake/Output Summary (Last 24 hours) at 12/02/2021 5597 Last data filed at 12/02/2021 0414 Gross per 24 hour  Intake 840 ml  Output 900 ml  Net -60 ml    Net IO Since Admission: -280 mL [12/02/21 0607]  Assessment/Plan: Alicia [name] is 71yo person with chronic diastolic heart failure, hyperlipidemia, hypertension, peripheral vascular disease, chronic respiratory failure  on 2L supplemental oxygen, previous CVA, type II diabetes mellitus, class III obesity, chronic renal insufficiency admitted 1/10 for acute on chronic heart failure.  Acute chronic diastolic HF  Patient given lasix 40 mg twice with less than ideal urine output. Weight unchanged from admission but legs appear less edematous. There was increase in her creatinine which along with lower urine output prompted further investigation so a renal ultrasound. Renal ultrasound did not show any acute findings. Lasix 80 mg IV was ordered. Leg swelling improved which appears secondary to her volume overload.  -IV lasix 80 mg, will monitor for response -Continuing metoprolol succinate 50 mg qd, and nicardipine 60 mg XR, hydralazine 25 mg TID -Strict weights and I/Os.  Severe asymptomatic HTN Hx of HTN Improving. BP 170s/60s. Currently on Toprol 50 mg, Nifedipine 60 mg, and lasix as needed. Hydralazine 25 mg was added today.  -Continue above antihypertensives.  -Monitor BP and uptitrate her regimen  T2DM Repeat A1c was 6.4 this from 8.0 in 06/2021.  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 Renal ultrasound was performed that showed no hydronephrosis but increase in cortical echogenicity bilaterally compared to previously which could explain her creatinine elevation. Since patient could have decreased perfusion of the kidneys due to volume overload, would like to diurese more today 12/02/21. -IV lasix 80 mg  -Monitor urine output -Repeat BMP tomorrow am -Avoid nephrotoxic medications -Renally dose medications   HLD/Hx of CVA Chronic. Some of her gait instability could be coming from her  previous stroke. Plan is to medically optimize patient to prevent another CVA. Repeat lipid panel is ordered. Patient is on Crestor 10 mg daily and if LDL>70 then will increase her Crestor appropriately.  -Continue Crestor 10 mg daily -Continue ASA  81 mg  Declining functional status PT/OT recommend home health.   Diet: Heart Healthy IVF: None,None VTE: Heparin Code: Full Prior to Admission Living Arrangement:Home Anticipated Discharge Location:Home Barriers to Discharge:Medical Stability Dispo: Anticipated discharge in approximately 1-2 day(s).   [name], MD [name]. [facility] Residency, PGY-1  Pager: [phone] After 5 pm and on weekends: Please call the on-call pager

## 2021-12-02 NOTE — Progress Notes (Signed)
Physical Therapy Treatment Patient Details Name: Alicia [name] MRN: [id number] DOB: 04-04-1951 Today's Date: 12/02/2021   History of Present Illness Pt is a 71 y.o. female who presented 12/10/21 from [facility] for lightheadedness and swelling. Pt admitted with acute on chronic diastolic heart failure. PMH: chronic diastolic heart failure, hyperlipidemia, hypertension, peripheral vascular disease, chronic respiratory failure on 2L supplemental oxygen, previous CVA, type II diabetes mellitus, class III obesity, chronic renal insufficiency    PT Comments    Pt is making good, steady progress with mobility, ambulating up to ~80 ft with a RW at a min guard assist level. She continues to be limited in mobility by a decline in her SpO2 levels when on RA and by a decline in her BP resulting in a lightheaded sensation. However, she reports the lightheadedness and her breathing have improved and the lightheadedness did not worsen as she ambulated. Educated pt to use pulse ox and supplemental O2 at 2L/min continuously at this time. Will continue to follow acutely. Current recommendations remain appropriate.  BP:  180/61 sitting EOB 158/70 sitting after taking a few steps  SpO2 down to 80% on RA at rest and with mobility, >/= 90% with 2L     Recommendations for follow up therapy are one component of a multi-disciplinary discharge planning process, led by the attending physician.  Recommendations may be updated based on patient status, additional functional criteria and insurance authorization.  Follow Up Recommendations  Home health PT     Assistance Recommended at Discharge Intermittent Supervision/Assistance  Patient can return home with the following A little help with walking and/or transfers;Help with stairs or ramp for entrance   Equipment Recommendations  None recommended by PT    Recommendations for Other Services       Precautions / Restrictions Precautions Precautions: Fall;Other  (comment) Precaution Comments: monitor SpO2 & BP Restrictions Weight Bearing Restrictions: No     Mobility  Bed Mobility Overal bed mobility: Needs Assistance Bed Mobility: Supine to Sit;Sit to Supine     Supine to sit: HOB elevated;Supervision Sit to supine: Supervision;HOB elevated   General bed mobility comments: Extra time, but pt able to perform all bed mobility aspects with HOB elevated with supervision.    Transfers Overall transfer level: Needs assistance Equipment used: Rolling walker (2 wheels) Transfers: Sit to/from Stand;Bed to chair/wheelchair/BSC Sit to Stand: Supervision     Step pivot transfers: Supervision     General transfer comment: Supervision for safety, but no LOB coming to stand from EOB and commode and transferring between surfaces with RW.    Ambulation/Gait Ambulation/Gait assistance: Min guard Gait Distance (Feet): 80 Feet (x3 bouts of ~4 ft > ~80 ft > ~4 ft) Assistive device: Rolling walker (2 wheels) Gait Pattern/deviations: Step-through pattern;Decreased stride length Gait velocity: reduced Gait velocity interpretation: <1.31 ft/sec, indicative of household ambulator   General Gait Details: Pt with slow but steady gait using RW. Reported lightheadedness throughout but did not worsen with extended gait. No LOB, min guard for safety.   Stairs             Wheelchair Mobility    Modified Rankin (Stroke Patients Only)       Balance Overall balance assessment: Needs assistance Sitting-balance support: No upper extremity supported;Feet supported Sitting balance-Leahy Scale: Good     Standing balance support: Reliant on assistive device for balance Standing balance-Leahy Scale: Poor Standing balance comment: Reliant on RW  Cognition Arousal/Alertness: Awake/alert Behavior During Therapy: WFL for tasks assessed/performed Overall Cognitive Status: Within Functional Limits for tasks  assessed                                          Exercises      General Comments General comments (skin integrity, edema, etc.): BP 180/61 sitting EOB, 158/70 sitting after taking a few steps; SpO2 down to 80% on RA at rest and with mobility, educated pt to use supplemental 2 L of O2 at all times and monitor sats via pulse ox to decide whether safe to reduce O2 or not at rest      Pertinent Vitals/Pain Pain Assessment: Faces Faces Pain Scale: No hurt Pain Intervention(s): Monitored during session    Home Living                          Prior Function            PT Goals (current goals can now be found in the care plan section) Acute Rehab PT Goals Patient Stated Goal: to improve PT Goal Formulation: With patient Time For Goal Achievement: 12/15/21 Potential to Achieve Goals: Good Progress towards PT goals: Progressing toward goals    Frequency    Min 3X/week      PT Plan Equipment recommendations need to be updated    Co-evaluation              AM-PAC PT "6 Clicks" Mobility   Outcome Measure  Help needed turning from your back to your side while in a flat bed without using bedrails?: A Little Help needed moving from lying on your back to sitting on the side of a flat bed without using bedrails?: A Little Help needed moving to and from a bed to a chair (including a wheelchair)?: A Little Help needed standing up from a chair using your arms (e.g., wheelchair or bedside chair)?: A Little Help needed to walk in hospital room?: A Little Help needed climbing 3-5 steps with a railing? : A Little 6 Click Score: 18    End of Session Equipment Utilized During Treatment: Oxygen Activity Tolerance: Patient tolerated treatment well Patient left: with call bell/phone within reach;in bed;with bed alarm set Nurse Communication: Mobility status PT Visit Diagnosis: Unsteadiness on feet (R26.81);Other abnormalities of gait and mobility  (R26.89);Muscle weakness (generalized) (M62.81);Difficulty in walking, not elsewhere classified (R26.2);Dizziness and giddiness (R42)     Time: 4196-2229 PT Time Calculation (min) (ACUTE ONLY): 21 min  Charges:  $Gait Training: 8-22 mins                     Moishe Spice, PT, DPT Acute Rehabilitation Services  Pager: [phone] Office: [place] 12/02/2021, 4:27 PM

## 2021-12-02 NOTE — Progress Notes (Signed)
Assisted pt back to bed, purwick replaced and connected to suction. Changed liens. Call bell within reach and positioned in bed for comfort.

## 2021-12-03 ENCOUNTER — Telehealth: Payer: Self-pay | Admitting: Student

## 2021-12-03 ENCOUNTER — Other Ambulatory Visit (HOSPITAL_COMMUNITY): Payer: Self-pay

## 2021-12-03 DIAGNOSIS — I509 Heart failure, unspecified: Secondary | ICD-10-CM | POA: Diagnosis not present

## 2021-12-03 LAB — BASIC METABOLIC PANEL
Anion gap: 9 (ref 5–15)
BUN: 24 mg/dL — ABNORMAL HIGH (ref 8–23)
CO2: 27 mmol/L (ref 22–32)
Calcium: 7.6 mg/dL — ABNORMAL LOW (ref 8.9–10.3)
Chloride: 105 mmol/L (ref 98–111)
Creatinine, Ser: 2.49 mg/dL — ABNORMAL HIGH (ref 0.44–1.00)
GFR, Estimated: 20 mL/min — ABNORMAL LOW (ref >60)
Glucose, Bld: 105 mg/dL — ABNORMAL HIGH (ref 70–99)
Potassium: 3.5 mmol/L (ref 3.5–5.1)
Sodium: 141 mmol/L (ref 135–145)

## 2021-12-03 LAB — MAGNESIUM: Magnesium: 1.9 mg/dL (ref 1.7–2.4)

## 2021-12-03 LAB — GLUCOSE, CAPILLARY
Glucose-Capillary: 116 mg/dL — ABNORMAL HIGH (ref 70–99)
Glucose-Capillary: 135 mg/dL — ABNORMAL HIGH (ref 70–99)
Glucose-Capillary: 112 mg/dL — ABNORMAL HIGH (ref 70–99)

## 2021-12-03 LAB — LIPID PANEL
Cholesterol: 101 mg/dL (ref 0–200)
HDL: 46 mg/dL (ref >40)
LDL Cholesterol: 36 mg/dL (ref 0–99)
Total CHOL/HDL Ratio: 2.2 RATIO
Triglycerides: 93 mg/dL (ref <150)
VLDL: 19 mg/dL (ref 0–40)

## 2021-12-03 MED ORDER — DICLOFENAC SODIUM 1 % EX GEL
2.0000 g | Freq: Three times a day (TID) | CUTANEOUS | 0 refills | Status: DC | PRN
  Filled 2021-12-03: qty 200, 30d supply, fill #0

## 2021-12-03 MED ORDER — HYDRALAZINE HCL 50 MG PO TABS
75.0000 mg | ORAL_TABLET | Freq: Three times a day (TID) | ORAL | 0 refills | Status: DC
  Filled 2021-12-03: qty 130, 29d supply, fill #0

## 2021-12-03 MED ORDER — ASPIRIN 81 MG PO TBEC: 81.0000 mg | DELAYED_RELEASE_TABLET | Freq: Every day | ORAL | 0 refills | Status: DC

## 2021-12-03 MED ORDER — METOPROLOL SUCCINATE ER 50 MG PO TB24
50.0000 mg | ORAL_TABLET | Freq: Every day | ORAL | 0 refills | Status: DC
  Filled 2021-12-03: qty 30, 30d supply, fill #0

## 2021-12-03 MED ORDER — ROSUVASTATIN CALCIUM 10 MG PO TABS
10.0000 mg | ORAL_TABLET | Freq: Every day | ORAL | 0 refills | Status: DC
  Filled 2021-12-03: qty 30, 30d supply, fill #0

## 2021-12-03 MED ORDER — FUROSEMIDE 40 MG PO TABS
40.0000 mg | ORAL_TABLET | Freq: Two times a day (BID) | ORAL | 1 refills | Status: DC
  Filled 2021-12-03 – 2021-12-31 (×2): qty 30, 15d supply, fill #0

## 2021-12-03 MED ORDER — HYDRALAZINE HCL 50 MG PO TABS
50.0000 mg | ORAL_TABLET | Freq: Three times a day (TID) | ORAL | Status: DC
  Administered 2021-12-03: 50 mg via ORAL
  Filled 2021-12-03: qty 1

## 2021-12-03 MED ORDER — HYDRALAZINE HCL 25 MG PO TABS
25.0000 mg | ORAL_TABLET | Freq: Once | ORAL | Status: AC
  Administered 2021-12-03: 25 mg via ORAL
  Filled 2021-12-03 (×3): qty 1

## 2021-12-03 MED ORDER — NIFEDIPINE ER 60 MG PO TB24
60.0000 mg | ORAL_TABLET | Freq: Every day | ORAL | 0 refills | Status: DC
  Filled 2021-12-03: qty 30, 30d supply, fill #0

## 2021-12-03 NOTE — TOC Transition Note (Addendum)
Transition of Care [facility]) - CM/SW Discharge Note   Patient Details  Name: Alicia [name] MRN: [id number] Date of Birth: 1951-04-21  Transition of Care [facility]) CM/SW Contact:  Zenon Mayo, RN Phone Number: 12/03/2021, 10:14 AM   Clinical Narrative:    Patient is for possible dc today, she is set up with [facility] for [place], [place].  NCM notified [place] with [place]. NCM asked [employer] to bring a tank by for patient to go home with. Patient states she will try to do better with her diet, like buying more fresh vegetables and using no salt seasoning, and eat less can foods.  She states she checks her blood pressure also but she may not have the cuff on correctly because lately it has been saying error, but she will see if she needs to get a new one if the error does not go away.  She states she may need transportation ast at discharge also.  BP is up in 200's per staff RN, so will wait to see if BP goes down.   Final next level of care: [place] Barriers to Discharge: No Barriers Identified   Patient Goals and CMS Choice Patient states their goals for this hospitalization and ongoing recovery are:: return home CMS Medicare.gov Compare Post Acute Care list provided to:: Patient Choice offered to / list presented to : Patient  Discharge Placement                       Discharge Plan and Services                  DME Agency: NA       HH Arranged: PT, OT HH Agency: [place] Date [place] [employer] [facility]: 12/01/21 Time HH Agency Contacted: 1500 Representative spoke with at [place]: [place] ([place]) Interventions     Readmission Risk Interventions Readmission Risk Prevention Plan 03/02/2020  Transportation Screening Complete  PCP or Specialist Appt within 5-7 Days Complete  Home Care Screening Complete  Medication Review (RN CM) Complete  Some recent data might be hidden

## 2021-12-03 NOTE — Discharge Summary (Addendum)
Name: Alicia [name] MRN: [id number] DOB: 08/16/1951 71 y.o. 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  Discharge Medications: Allergies as of 12/03/2021       Reactions   Atorvastatin Swelling   Lip swelling to lipitor Patient tolerates crestor   Nifedipine Er Other (See Comments)   Caused nose bleeds in higher doses (90 mg., namely)   Latex Rash        Medication List     STOP taking these medications    chlorthalidone 25 MG tablet Commonly known as: HYGROTON   irbesartan 75 MG tablet Commonly known as: AVAPRO   metFORMIN 1000 MG tablet Commonly known as: Glucophage       TAKE these medications    Acetaminophen Extra Strength 500 MG tablet Generic drug: acetaminophen Take 2 tablets (1,000 mg total) by mouth every 8 (eight) hours as needed for moderate pain or headache.   aspirin 81 MG EC tablet Take 1 tablet (81 mg total) by mouth daily.   blood glucose meter kit and supplies per insurance preference. Check cbg once a day. Dx E11.9   diclofenac Sodium 1 % Gel Commonly known as: VOLTAREN Apply 2 g topically 3 (three) times daily as needed (Pain).   furosemide 40 MG tablet Commonly known as: Lasix Take 1 tablet (40 mg total) by mouth 2 (two) times daily. What changed: when to take this   hydrALAZINE 50 MG tablet Commonly known as: APRESOLINE Take 1.5 tablets (75 mg total) by mouth every 8 (eight) hours. What changed:  medication strength how much to take when to take this   metoprolol succinate 50 MG 24 hr tablet Commonly known as:  TOPROL-XL Take 1 tablet (50 mg total) by mouth daily. Take with or immediately following a meal. Start taking on: December 04, 2021   NIFEdipine 60 MG 24 hr tablet Commonly known as: Procardia XL Take 1 tablet (60 mg total) by mouth daily. For high blood pressure   OXYGEN Inhale 2 L into the lungs as needed (oxygen).   rosuvastatin 10 MG tablet Commonly known as: CRESTOR Take 1 tablet (10 mg total) by mouth daily.   sitaGLIPtin 25 MG tablet Commonly known as: Januvia Take 1 tablet (25 mg total) by mouth daily.        Disposition and follow-up:   Alicia [name] was discharged from [facility] in Stable condition.  At the hospital follow up visit please address: Acute on chronic HF: Patient initially found to be hypervolemic on initial exam and given IV lasix. She became more euvolemic after and was discharged with po lasix 40 mg twice daily. Please assess her volume status and decrease it to once daily if needed.  Severe asymptomatic hypertension: Patient has hx of hypertension and has poor control that is multifactorial including non-adherence and poor follow up. Her irbesartan and chlorthalidone were held due to AKI but they can be resumed once that resolves. I believe she may benefit from sleep study to evaluate OSA as her blood pressure is uncontrolled despite multiple agents.  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 Repeat BMP at clinic follow up.    2.  Labs / imaging needed at time of follow-up: BMP  3.  Pending labs/ test needing follow-up: None  Follow-up Appointments:  Follow-up Information     Rick Duff, MD Follow up.   Contact information: [address] [place] [address] [phone]         Care, [facility] Follow up.   Specialty: Home Health Services Why: [place] Contact information: [place] [address] [phone]                 Hospital Course by problem list: HPI Per Dr. Collene Gobble "Alicia [name] is 71yo person with chronic diastolic heart failure, hyperlipidemia, hypertension, peripheral vascular disease, chronic respiratory failure on 2L supplemental oxygen, previous CVA, type II diabetes mellitus, class III obesity, chronic renal insufficiency presenting to [facility] from [facility] for lightheadedness and swelling. Patient presented to [facility] yesterday for regular follow-up appointment. She reports that many of her issues are chronic and has not noticed many acute changes. Notes some dyspnea over the last few days along with chronic orthopnea. She mentions having "heart problems" for a long time and stopped taking her lasix months ago due to lightheadedness. She has noticed her L leg has increased in size along with her arms, and face. Further notes this has been ongoing "for awhile." Also mentions diffuse pain in the L leg. No personal history of cancer or recent surgery. Patient does live alone and reports having a hard time taking care of herself lately. She has not weighed herself in months because her scale is out of batteries. She states that she was 250lb the last time she weighed herself. She denies chest pain, abdominal pain, nausea, vomiting, dysuria."  Acute on chronic diastolic HF Patient has hx of CHF and her Echo completed in August 2022 during previous admission for similar issue. At that time EF 55-60%, moderate concentric left ventricular hypertrophy, trivial mitral regurgitation, and mild/moderate aortic regurgitation. Admission weight was 124.kg, previously was 121.8kg during clinic visit in September. BNP was 664. Her left leg was more edematous than her left leg concerning for DVT. LE ultrasound was negative for DVT and the swelling came down with IV lasix. She was given IV lasix 40 mg initially with 600 cc of output and then IV lasix 80 mg with  2.2 L of output. She appeared more euvolemic on exam at discharge and was instructed to take IV lasix 40 mg twice daily. Has oxygen requirement at baseline of 2 L. She stated she has oxygen at home and does not need further orders.   Severe asymptomatic hypertension Patient presented with BP >200/90. She endorsed compliance with her medications except her lasix which she stated she discontinued her lasix last month due to the lightheadedness. With her elevated creatinine, her olmesartan was held along with her chlorthalidone. She was started on metoprolol succinate 50 mg daily, nifedipine 60 mg daily, hydralazine 25 mg TID and IV lasix as needed. Her blood improved to 170/67 and fluctuated. Her hydralazine was increased to 75 TID. She was discharged with Toprol 50 mg daily, nifedipine 60 mg, lasix 40 mg BID, and hydralazine 75 mg TID. 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 With her obesity, I suspect she may have sleep apnea. Please consider further testing if indicated. Her renal  artery ultrasound was negative for renal artery stenosis. Leaving hyperaldosteronism or OSA as causes for her resistant hypertension.    T2DM Home meds include Januvia and metformin. Her A1c was 6.4 while inpatient. She was placed on SSI during her hospitalization. She was discharged with Januvia and was instructed to hold her metformin. Please restart Metformin when the AKI resolves.   AKI on CKD  Patient had elevated creatinine at 2.35 on admission. 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 She was diuresed due to volume overload which didn't result in adequate response  initially and caused a slight increase in her creatinine. A renal ultrasound was ordered which did not show any hydronephrosis but showed increased echogenicity in her kidneys suggesting advancement of her kidney disease. With continued diuresis she did have significant output and her kidney  function showed improvement. It was hard to differentiate if this is her baseline given the ultrasound findings and her co-morbidities, the above mentioned medications were held. If kidney function stay same as her hospitalization without any improvement, then she most likely has CKDIV. Please evaluate this with repeat BMP and restart her medications as indicated.    Hyperlipidemia/Hx of CVA Chronic for patient. Repeat lipid panel showed LDL of 36 so her Crestor 10 mg was continued. MRI was done which did not show any acute changes but chronic infarcts in the right cerebellar and bilateral thalami. I suspect this is contributing to her feeling of unsteadiness. This feeling improved when working with PT so I believe deconditioning is playing a role here as well. PT recommended home health PT/OT.    Hypocalcemia Chronic for patient. 7.6 on recent BMP and 8.7 when corrected for hypoalbuminemia.   Discharge Subjective: Feels much better today. Was seen in bedside chair. Breathing feels ok and she was able to walk with PT/OT. Patient in good spirits.  Discharge Exam:   BP (!) 217/90 (BP Location: Left Arm)    Pulse 69    Temp 98.4 F (36.9 C) (Oral)    Resp 20    Ht _0  (1.702 m)    Wt 123.9 kg    SpO2 93%    BMI 42.79 kg/m  Physical Exam General: NAD, resting comfortably in recliner chair.  Head: Normocephalic without scalp lesions.  Mouth and Throat: Lips normal color, without lesions. Neck: Neck supple with full range of motion (ROM).  Lungs: mild crackles at bases. Improved air movement bilaterally.  Cardiovascular: Normal heart sounds, no r/m/g, +1 LE edema (improved since admission) Abdomen: No TTP, normal bowel sounds MSK: No asymmetry or muscle atrophy.  Skin: no lesions noted Neuro: Alert and oriented. CN grossly intact Psych: Normal mood and normal affect   Pertinent Labs, Studies, and Procedures:  CBC Latest Ref Rng & Units 11/30/2021 07/19/2021 07/18/2021  WBC 4.0 - 10.5 K/uL 7.5  6.6 6.7  Hemoglobin 12.0 - 15.0 g/dL 11.5(L) 12.3 11.8(L)  Hematocrit 36.0 - 46.0 % 36.7 37.5 36.1  Platelets 150 - 400 K/uL 286 252 247    CMP Latest Ref Rng & Units 12/03/2021 12/02/2021 12/01/2021  Glucose 70 - 99 mg/dL 105(H) 120(H) 88  BUN 8 - 23 mg/dL 24(H) 22 24(H)  Creatinine 0.44 - 1.00 mg/dL 2.49(H) 2.59(H) 2.49(H)  Sodium 135 - 145 mmol/L 141 138 142  Potassium 3.5 - 5.1 mmol/L 3.5 3.6 4.0  Chloride 98 - 111 mmol/L 105 106 105  CO2 22 - 32 mmol/L _1 Calcium 8.9 - 10.3 mg/dL 7.6(L) 7.5(L) 7.6(L)  Total  Protein 6.5 - 8.1 g/dL - - -  Total Bilirubin 0.3 - 1.2 mg/dL - - -  Alkaline Phos 38 - 126 U/L - - -  AST 15 - 41 U/L - - -  ALT 0 - 44 U/L - - -    BNP 664  A1c 6.4  Magnesium 1.9  MR BRAIN WO CONTRAST  Result Date: 12/01/2021 CLINICAL DATA:  Provided history: Dizziness, persistent/recurrent, cardiac or vascular cause suspected. EXAM: MRI HEAD WITHOUT CONTRAST TECHNIQUE: IMPRESSION: Motion degraded exam. No evidence of acute intracranial abnormality. Unchanged chronic small-vessel infarcts within the bilateral thalami and within the pons. Moderate chronic small vessel ischemic changes within the cerebral white matter and pons, mildly progressed from the brain MRI of 03/24/2018. Unchanged chronic infarcts within the right cerebellar hemisphere. 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 Mild generalized parenchymal atrophy. Paranasal sinus disease, as described. Electronically Signed   By: Kellie Simmering D.O.   On: 12/01/2021 12:39   DG Chest Port 1 View  Result Date: 12/01/2021 CLINICAL DATA:  Dyspnea EXAM: PORTABLE CHEST 1 VIEW COMPARISON:   IMPRESSION: Similar cardiomegaly. Mild pleural thickening or fluid along the right minor fissure. Electronically Signed   By: Macy Mis M.D.   On: 12/01/2021 09:15   VAS [place] LOWER EXTREMITY VENOUS (DVT)  Result Date:  12/01/2021  Lower Venous DVT Study [name]  Date of Exam:   12/01/2021 Medical Rec #: [id number]             Accession #:    [id number] Date of Birth: June 19, 1951             Patient Gender: F Patient Age:   68 years Exam Location:  [facility] Procedure:      VAS [place] LOWER EXTREMITY VENOUS (DVT) Referring Phys: Damita Dunnings VINCENT Summary: RIGHT: - No evidence of common femoral vein obstruction.  LEFT: - There is no evidence of deep vein thrombosis in the lower extremity.  - No cystic structure found in the popliteal fossa.  *See table(s) above for measurements and observations. Electronically signed by Monica Martinez MD on 12/01/2021 at 4:40:15 PM.    Final      Discharge Instructions: Discharge Instructions     Call MD for:  difficulty breathing, headache or visual disturbances   Complete by: As directed    Call MD for:  extreme fatigue   Complete by: As directed    Call MD for:  hives   Complete by: As directed    Call MD for:  persistant dizziness or light-headedness   Complete by: As directed    Call MD for:  persistant nausea and vomiting   Complete by: As directed    Call MD for:  redness, tenderness, or signs of infection (pain, swelling, redness, odor or green/yellow discharge around incision site)   Complete by: As directed    Call MD for:  severe uncontrolled pain   Complete by: As directed    Call MD for:  temperature >100.4   Complete by: As directed    Diet - low sodium heart healthy   Complete by: As directed    Increase activity slowly   Complete by: As directed      It was a pleasure taking care of you!  You presented with lightheadedness and increasing lower extremity edema. You were found to have too much fluid and were given lasix. You will  be discharged with lasix 40 mg twice daily. Although you stopped taking lasix at home because you thought it was making you lightheaded, I would like you to continue taking it at home. Your  lightheadedness is due to partly to your previous stroke and physical therapy should help with this.   You had elevated blood pressures during your hospitalization.Since your blood pressure has been elevated with multiple medications, I suspect you have obstructive sleep apnea. We will plan to order a sleep study for you during your clinic visit. We are going to change your blood pressure medications. Please take them everyday. If you have any problems, call the clinic before stopping them. We have a follow up planned for you on 12/10/21 at 10:45 am.  We will stop your metformin, irbesartan, and chlorthalidone until your kidney function improves. We will check your kidney function at your follow up.   Signed: [name], MD [name]. [facility] Internal Medicine Residency, PGY-1  12/03/2021, 1:13 PM   Pager: [phone]

## 2021-12-03 NOTE — Progress Notes (Signed)
Mobility Specialist Progress Note    12/03/21 1028  Mobility  Activity Ambulated in hall  Level of Assistance Minimal assist, patient does 75% or more  Assistive Device Front wheel walker  Distance Ambulated (ft) 100 ft (50+50)  Mobility Ambulated with assistance in hallway  Mobility Response Tolerated fair  Mobility performed by Mobility specialist  Bed Position Chair  $Mobility charge 1 Mobility   Pre-Mobility: 76 HR, 99% SpO2 During Mobility: 85% SpO2 Post-Mobility: 94 HR, 97% SpO2  Pt received in chair and agreeable. Ambulated on 2LO2. Encouraged focus on pursed lip breathing to recover SpO2 when desatted as low as 85%. Took x1 seated rest break. Returned to chair with call bell in reach.   [facility] Mobility Specialist  M.S. 2C and 6E: [phone] M.S. 4E: [phone]

## 2021-12-03 NOTE — Progress Notes (Signed)
Occupational Therapy Treatment Patient Details Name: Alicia [name] MRN: [id number] DOB: 1951/10/25 Today's Date: 12/03/2021   History of present illness Pt is a 71 y.o. female who presented 12/10/21 from [facility] for lightheadedness and swelling. Pt admitted with acute on chronic diastolic heart failure. PMH: chronic diastolic heart failure, hyperlipidemia, hypertension, peripheral vascular disease, chronic respiratory failure on 2L supplemental oxygen, previous CVA, type II diabetes mellitus, class III obesity, chronic renal insufficiency   OT comments  Pt progressing towards OT goals. Reports feeling better each day. Good transfers with RW, able to engage in UB and LB bathing from [facility] in front of sink, and then ambualtion to recliner. Pt overall supervision and set up in seated position for ADL. Pt SpO2 >90% throughout on 2L O2 via [place]. OT POC remains appropriate at this time.    Recommendations for follow up therapy are one component of a multi-disciplinary discharge planning process, led by the attending physician.  Recommendations may be updated based on patient status, additional functional criteria and insurance authorization.    Follow Up Recommendations  Home health OT    Assistance Recommended at Discharge Intermittent Supervision/Assistance  Patient can return home with the following  A little help with bathing/dressing/bathroom;A little help with walking and/or transfers   Equipment Recommendations  None recommended by OT    Recommendations for Other Services      Precautions / Restrictions Precautions Precautions: Fall;Other (comment) Precaution Comments: monitor SpO2 & BP Restrictions Weight Bearing Restrictions: No       Mobility Bed Mobility Overal bed mobility: Needs Assistance Bed Mobility: Supine to Sit     Supine to sit: Supervision     General bed mobility comments: Extra time, but pt able to perform all bed mobility aspects with HOB elevated with  supervision.    Transfers Overall transfer level: Needs assistance Equipment used: Rolling walker (2 wheels) Transfers: Sit to/from Stand Sit to Stand: Supervision           General transfer comment: Supervision for safety, but no LOB coming to stand from EOB and commode and transferring between surfaces with RW.     Balance Overall balance assessment: Needs assistance Sitting-balance support: No upper extremity supported;Feet supported Sitting balance-Leahy Scale: Good     Standing balance support: Reliant on assistive device for balance Standing balance-Leahy Scale: Poor Standing balance comment: Reliant on RW                           ADL either performed or assessed with clinical judgement   ADL Overall ADL's : Needs assistance/impaired     Grooming: Wash/dry face;Wash/dry hands;Oral care;Set up;Sitting Grooming Details (indicate cue type and reason): on BSC at sink Upper Body Bathing: Set up;Sitting Upper Body Bathing Details (indicate cue type and reason): on BSC at sink Lower Body Bathing: Set up;Sit to/from stand Lower Body Bathing Details (indicate cue type and reason): on BSC at sink Upper Body Dressing : Set up;Sitting       Toilet Transfer: Supervision/safety;BSC/3in1;Rolling walker (2 wheels)   Toileting- Clothing Manipulation and Hygiene: Supervision/safety;Sit to/from stand   Tub/ Shower Transfer: Agricultural engineer (2 wheels)   Functional mobility during ADLs: Min guard;Rolling walker (2 wheels) General ADL Comments: on 2 L O2 throughout SpO2 >90% throughout    Extremity/Trunk Assessment Upper Extremity Assessment Upper Extremity Assessment: Overall WFL for tasks assessed   Lower Extremity Assessment Lower Extremity Assessment: Defer to PT evaluation  Vision       Perception     Praxis      Cognition Arousal/Alertness: Awake/alert Behavior During Therapy: WFL for tasks assessed/performed Overall Cognitive  Status: Within Functional Limits for tasks assessed                                            Exercises     Shoulder Instructions       General Comments      Pertinent Vitals/ Pain       Pain Assessment: No/denies pain Pain Intervention(s): Monitored during session  Home Living                                          Prior Functioning/Environment              Frequency  Min 2X/week        Progress Toward Goals  OT Goals(current goals can now be found in the care plan section)     Acute Rehab OT Goals Patient Stated Goal: feel better, graduate from needing O2 OT Goal Formulation: With patient Time For Goal Achievement: 12/15/21 Potential to Achieve Goals: Good  Plan Discharge plan remains appropriate;Frequency remains appropriate    Co-evaluation                 AM-PAC OT "6 Clicks" Daily Activity     Outcome Measure   Help from another person eating meals?: None Help from another person taking care of personal grooming?: None Help from another person toileting, which includes using toliet, bedpan, or urinal?: A Little Help from another person bathing (including washing, rinsing, drying)?: A Little Help from another person to put on and taking off regular upper body clothing?: None Help from another person to put on and taking off regular lower body clothing?: A Little 6 Click Score: 21    End of Session Equipment Utilized During Treatment: Oxygen;Rolling walker (2 wheels)  OT Visit Diagnosis: Unsteadiness on feet (R26.81);Muscle weakness (generalized) (M62.81)   Activity Tolerance Patient tolerated treatment well   Patient Left in chair;with call bell/phone within reach;with chair alarm set   Nurse Communication Mobility status        Time: 5732-2025 OT Time Calculation (min): 35 min  Charges: OT General Charges $OT Visit: 1 Visit OT Treatments $Self Care/Home Management : 23-37 mins  Jesse Sans  OTR/L Acute Rehabilitation Services Pager: [phone] Office: [place] 12/03/2021, 12:43 PM

## 2021-12-03 NOTE — Discharge Instructions (Addendum)
It was a pleasure taking care of you!  You presented with lightheadedness and increasing lower extremity edema. You were found to have too much fluid and were given lasix. You will be discharged with lasix 40 mg twice daily. Although you stopped taking lasix at home because you thought it was making you lightheaded, I would like you to continue taking it at home. Your lightheadedness is due to partly to your previous stroke and physical therapy should help with this.   You had elevated blood pressures during your hospitalization.Since your blood pressure has been elevated with multiple medications, I suspect you have obstructive sleep apnea. We will plan to order a sleep study for you during your clinic visit. We are going to change your blood pressure medications. Please take them everyday. If you have any problems, call the clinic before stopping them. We have a follow up planned for you on 12/10/21 at 10:45 am.  We will stop your metformin, irbesartan, and chlorthalidone until your kidney function improves. We will check your kidney function at your follow up.

## 2021-12-03 NOTE — Telephone Encounter (Signed)
-----   Message from Dorethea Clan, DO sent at 12/03/2021 10:53 AM EST ----- Hi! This patient will likely be discharged from the hospital today. Can we schedule her a hospital follow up appt in 1 week? Thanks!

## 2021-12-03 NOTE — Telephone Encounter (Signed)
TOC HFU appointment scheduled for 12/10/21 at 10:45 am with Dr. Lisabeth Devoid.

## 2021-12-03 NOTE — Care Management Important Message (Signed)
Important Message  Patient Details  Name: Alicia [name] MRN: [id number] Date of Birth: 1951/02/07   Medicare Important Message Given:  Yes     [name] 12/03/2021, 10:20 AM

## 2021-12-07 ENCOUNTER — Other Ambulatory Visit: Payer: Self-pay

## 2021-12-07 DIAGNOSIS — I10 Essential (primary) hypertension: Secondary | ICD-10-CM

## 2021-12-07 DIAGNOSIS — I509 Heart failure, unspecified: Secondary | ICD-10-CM

## 2021-12-09 ENCOUNTER — Ambulatory Visit (HOSPITAL_BASED_OUTPATIENT_CLINIC_OR_DEPARTMENT_OTHER): Payer: Medicare HMO | Admitting: Cardiovascular Disease

## 2021-12-10 ENCOUNTER — Other Ambulatory Visit (HOSPITAL_COMMUNITY): Payer: Self-pay

## 2021-12-10 ENCOUNTER — Ambulatory Visit (INDEPENDENT_AMBULATORY_CARE_PROVIDER_SITE_OTHER): Payer: Medicare HMO | Admitting: Student

## 2021-12-10 ENCOUNTER — Other Ambulatory Visit: Payer: Self-pay | Admitting: Student

## 2021-12-10 ENCOUNTER — Other Ambulatory Visit: Payer: Self-pay

## 2021-12-10 ENCOUNTER — Encounter: Payer: Self-pay | Admitting: Student

## 2021-12-10 VITALS — BP 192/70 | HR 82 | Temp 98.2°F | Ht 67.0 in | Wt 269.2 lb

## 2021-12-10 DIAGNOSIS — I5033 Acute on chronic diastolic (congestive) heart failure: Secondary | ICD-10-CM

## 2021-12-10 DIAGNOSIS — N189 Chronic kidney disease, unspecified: Secondary | ICD-10-CM

## 2021-12-10 DIAGNOSIS — N179 Acute kidney failure, unspecified: Secondary | ICD-10-CM

## 2021-12-10 DIAGNOSIS — I161 Hypertensive emergency: Secondary | ICD-10-CM

## 2021-12-10 DIAGNOSIS — F329 Major depressive disorder, single episode, unspecified: Secondary | ICD-10-CM

## 2021-12-10 DIAGNOSIS — I10 Essential (primary) hypertension: Secondary | ICD-10-CM | POA: Diagnosis not present

## 2021-12-10 DIAGNOSIS — I130 Hypertensive heart and chronic kidney disease with heart failure and stage 1 through stage 4 chronic kidney disease, or unspecified chronic kidney disease: Secondary | ICD-10-CM

## 2021-12-10 DIAGNOSIS — I5032 Chronic diastolic (congestive) heart failure: Secondary | ICD-10-CM | POA: Diagnosis not present

## 2021-12-10 DIAGNOSIS — J449 Chronic obstructive pulmonary disease, unspecified: Secondary | ICD-10-CM | POA: Diagnosis not present

## 2021-12-10 DIAGNOSIS — N1831 Chronic kidney disease, stage 3a: Secondary | ICD-10-CM | POA: Diagnosis not present

## 2021-12-10 MED ORDER — ALBUTEROL SULFATE HFA 108 (90 BASE) MCG/ACT IN AERS
2.0000 | INHALATION_SPRAY | Freq: Four times a day (QID) | RESPIRATORY_TRACT | 2 refills | Status: DC | PRN
  Filled 2021-12-10: qty 18, 25d supply, fill #0
  Filled 2022-02-04: qty 18, 25d supply, fill #1

## 2021-12-10 NOTE — Patient Instructions (Addendum)
For your blood pressure:   We will continue hydralazine 75mg  THREE times a day.  Continue with the metoprolol 50mg  daily  Nifedipine 60mg  daily  RESTART your irbesartan 75mg  daily  For your swelling in your leg:  Lasix (Furosemide) 40mg  two times a day Watch your sodium intake.

## 2021-12-14 ENCOUNTER — Telehealth: Payer: Self-pay | Admitting: *Deleted

## 2021-12-14 NOTE — Assessment & Plan Note (Signed)
Patient recently hospitalized for HFpEF exacerbation. She reports that she continues to have lower extremity edema R worse than left since being discharged. She was discharged with lasix 40mg  twice daily dosing but states that she was confused about the instructions and only took one pill a day. She has 2+ pitting edema of the RLE, the LLE with trace edema. No wheezing or rales. Her kidney function is stable.   A/P: Discussed with patient that she should continue the lasix 40mg  twice daily and she should follow up with [place] in 4 weeks. At that time, we will decide if her lasix dose should be decreased based on her fluid status.

## 2021-12-14 NOTE — Assessment & Plan Note (Signed)
Called [facility] to try and schedule appointment for patient. They noted they will get back with patient.

## 2021-12-14 NOTE — Progress Notes (Signed)
° °  CC: hospital f/u   HPI:  [name] is a 71 y.o. F with PMH per below who presents for hospital follow up after recently being hospitalized for HFpEF exacerbation in the setting of severe uncontrolled hypertension. Please see problem based charting under encounters tab for further details.    Past Medical History:  Diagnosis Date   Allergy    Arthritis    Chronic headaches    Constipation    Depression    Diabetes mellitus    Hyperlipidemia    Hypertension    Obesity    PAD (peripheral artery disease) (HCC)    Shortness of breath    Stage 3 chronic kidney disease ([place])    Stroke ([place])    Review of Systems:  Please see problem based charting under encounters tab for further details.    Physical Exam:  Vitals:   12/10/21 1104  BP: (!) 192/70  Pulse: 82  Temp: 98.2 F (36.8 C)  TempSrc: Oral  SpO2: 91%  Weight: 269 lb 3.2 oz (122.1 kg)  Height: 5\' 7"  (1.702 m)   Constitutional: Well-developed, well-nourished, and in no distress.  HENT:  Head: Normocephalic and atraumatic.  Eyes: EOM are normal.  Neck: Normal range of motion.  Cardiovascular: Normal rate, regular rhythm, intact distal pulses. No gallop and no friction rub.  No murmur heard. No lower extremity edema  Pulmonary: Non labored breathing on room air, no wheezing or rales  Abdominal: Soft. Normal bowel sounds. Non distended and non tender Musculoskeletal: Normal range of motion.        General: No tenderness or edema.  Neurological: Alert and oriented to person, place, and time. Non focal  Skin: Skin is warm and dry.    Assessment & Plan:   See Encounters Tab for problem based charting.  Patient discussed with Dr. Philipp Ovens

## 2021-12-14 NOTE — Chronic Care Management (AMB) (Signed)
°  Care Management   Outreach Note  12/14/2021 Name: Alicia [name] MRN: [id number] DOB: January 30, 1951  Referred by: Rick Duff, MD Reason for referral : Care Coordination (Initial outreach to schedule referral with BSW)   An unsuccessful telephone outreach was attempted today. The patient was referred to the case management team for assistance with care management and care coordination.   Follow Up Plan:  A HIPAA compliant phone message was left for the patient providing contact information and requesting a return call.  The care management team will reach out to the patient again over the next 7 days. If patient returns call to provider office, please advise to call [place] at [phone].  [facility]  Direct Dial: [phone]

## 2021-12-14 NOTE — Assessment & Plan Note (Addendum)
Patient's blood pressure remains elevated since being discharged from the hospital recently. It is 161W systolic in clinic. Patient reports that she has not been able to take her hydralazine three times a day. She has been adherent with metoprolol succinate 50mg  daily and nifedipine 60mg  daily.   A/P: Patient's blood pressures remain poorly controlled. Discussed with patient the consequences of persistently elevated blood pressures including kidney and heart dysfunction. We also discussed that she should resume her irbesartan 50mg  daily and continue the hydralazine 75mg  THREE times a day. She stated that she understood how to take her medications and felt that this current antihypertensive regimen would be feasible for her to maintain adherence. We also discussed that she may have underlying lung disease contributing to her blood pressure as well as a sleep apnea. A referral was sent for a sleep study and PFTs were ordered.   In addition a Renin aldosterone level was sent. This result was pending.  -Patient will follow up in 4 months to discuss potentially resuming her chlorthalidone. Encouraged patient to discontinue smoking.   Addendum: Component Ref Range & Units 7 d ago  ALDOSTERONE 0.0 - 30.0 ng/dL 5.8   Renin 0.167 - 5.380 ng/mL/hr <0.167Low   ALDOS/RENIN RATIO 0.0 - 30.0 >34.7High

## 2021-12-15 LAB — BMP8+ANION GAP
Anion Gap: 16 mmol/L (ref 10.0–18.0)
BUN/Creatinine Ratio: 10 — ABNORMAL LOW (ref 12–28)
BUN: 22 mg/dL (ref 8–27)
CO2: 25 mmol/L (ref 20–29)
Calcium: 8.7 mg/dL (ref 8.7–10.3)
Chloride: 105 mmol/L (ref 96–106)
Creatinine, Ser: 2.14 mg/dL — ABNORMAL HIGH (ref 0.57–1.00)
Glucose: 126 mg/dL — ABNORMAL HIGH (ref 70–99)
Potassium: 4.4 mmol/L (ref 3.5–5.2)
Sodium: 146 mmol/L — ABNORMAL HIGH (ref 134–144)
eGFR: 24 mL/min/{1.73_m2} — ABNORMAL LOW (ref >59)

## 2021-12-15 LAB — ALDOSTERONE + RENIN ACTIVITY W/ RATIO
ALDOS/RENIN RATIO: >34.7 — ABNORMAL HIGH (ref 0.0–30.0)
ALDOSTERONE: 5.8 ng/dL (ref 0.0–30.0)
Renin: <0.167 ng/mL/hr — ABNORMAL LOW (ref 0.167–5.380)

## 2021-12-16 NOTE — Progress Notes (Signed)
Internal Medicine Clinic Attending  Case discussed with Dr. Eulas Post  At the time of the visit.  We reviewed the residents history and exam and pertinent patient test results.  I agree with the assessment, diagnosis, and plan of care documented in the residents note with the following correction.   Patient does not have hypertensive emergency but severe asymptomatic hypertension. 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 Patient will need follow up confirmatory testing with either a salt load challenge or saline infusion testing.

## 2021-12-20 ENCOUNTER — Other Ambulatory Visit (HOSPITAL_COMMUNITY): Payer: Self-pay

## 2021-12-20 NOTE — Addendum Note (Signed)
Addended by: Althea Grimmer on: 12/20/2021 10:27 AM   Modules accepted: Orders

## 2021-12-22 ENCOUNTER — Telehealth: Payer: Self-pay | Admitting: *Deleted

## 2021-12-22 ENCOUNTER — Other Ambulatory Visit (HOSPITAL_COMMUNITY): Payer: Self-pay | Admitting: *Deleted

## 2021-12-22 MED ORDER — SODIUM CHLORIDE 0.9 % IV SOLN: INTRAVENOUS | Status: AC

## 2021-12-22 NOTE — Addendum Note (Signed)
Addended by: Althea Grimmer on: 12/22/2021 02:10 PM   Modules accepted: Orders

## 2021-12-22 NOTE — Chronic Care Management (AMB) (Signed)
°  Care Management   Outreach Note  12/22/2021 Name: JEANNETT DEKONING MRN: [id number] DOB: [birth year]  Referred by: Rick Duff, MD Reason for referral : Care Coordination (Initial outreach to schedule referral with BSW)   An unsuccessful telephone outreach was attempted today. The patient was referred to the case management team for assistance with care management and care coordination.   Follow Up Plan:  The care management team will reach out to the patient again over the next 7 days.  If patient returns call to provider office, please advise to call [place] * at [facility]  Direct Dial: [phone]

## 2021-12-22 NOTE — Telephone Encounter (Signed)
Attempted to contact pt with appt below-no answer  Unable to leave message CMA will attempt to contact pt tomorrow  Name: Alicia [name], Alicia [name] MRN: [id number]  Date: 01/03/2022 Status: Sch  Time: 8:00 AM Length: 300  Visit Type: IV MED 300 [441] Copay: $0.00  Provider: [id number] Department: [facility]  Referring Provider: Rick Duff CSN: [id number]  Notes: saline loading test

## 2021-12-23 ENCOUNTER — Encounter: Payer: Self-pay | Admitting: Student

## 2021-12-25 ENCOUNTER — Telehealth: Payer: Self-pay | Admitting: Student

## 2021-12-25 NOTE — Telephone Encounter (Signed)
Received a page from RN Glenard Haring) with [place].  Stated that she was at patient's home and that patient's blood pressure was elevated to 786 systolic in addition patient was having some lightheadedness and a headache.  She discussed with patient that she should go to the hospital and be evaluated however patient refused.  She discussed with me that the patient has not been taking her metoprolol for the past 2 days and only intermittently takes her hydralazine.  She stated that she has given the patient a pillbox to try to help her be more adherent with her medications.  Patient's nurse noted that her lower extremity edema was significantly improved, her weight was also improved.  Patient was on room air as well with non labored breathing.   Discussed with patient via her nurse that presentation to the hospital is still recommended given she is symptomatic and her blood pressures are elevated to the 767M systolic.  Patient continued to refuse presentation to the ED but did note if her symptoms worsen she will call EMS.  Patient will continue metoprolol, nifedipine, and hydralazine.  I will send a message to our front desk to ask for an appointment early next week in our clinic.

## 2021-12-27 NOTE — Telephone Encounter (Addendum)
Received call from [place], [place] with [facility]. States he is in patient's home now and BP is 202/110. States patient is completely nonadherent to medications, diet, weight checks. States she stays in one spot all day and will go back to bed when he leaves. States she does not take these high BP readings seriously. States HH RN has also spoken with patient's daughter and neighbor but they haven't seen any changes. Spoke with patient who states, "I'm not feeling too good." C/o feeling tired, light-headed, pain in hands and arms. States she has not taken any medication today except 50 mg of hydralazine. Takes lasix, and nifedipine later in day when she eats. Has been out of metoprolol x 3 days. Discussed dose of hydralazine is 75 mg TID. She is advised to take additional 25 mg now as well as lasix and nifedipine. She was instructed to go to ED but is refusing.  Also, [facility] would like to postpone HH PT until patient's BP is controlled.

## 2021-12-27 NOTE — Telephone Encounter (Signed)
Hello, can we get this patient scheduled for an office visit to discuss her blood pressure please?

## 2021-12-29 ENCOUNTER — Telehealth: Payer: Self-pay | Admitting: Student

## 2021-12-29 NOTE — Telephone Encounter (Signed)
Please refer to message below.  Unable to contact patient via telephone today, phone just rings and no voicemail.  Future appointment scheduled for next Friday 01/07/22 at 9:45 am and mailed to patient.

## 2021-12-29 NOTE — Telephone Encounter (Signed)
-----   Message from Rick Duff, MD sent at 12/25/2021 12:49 PM EST ----- Could we please get patient scheduled for an appointment early next week?  Patient is having blood pressures up to the 210s per her home health nurse and is refusing to go to the emergency department.

## 2021-12-29 NOTE — Chronic Care Management (AMB) (Signed)
°  Care Management   Outreach Note  12/29/2021 Name: Alicia [name] MRN: [id number] DOB: [birth year]  Referred by: Rick Duff, MD Reason for referral : Care Coordination (Initial outreach to schedule referral with BSW)   Third unsuccessful telephone outreach was attempted today. The patient was referred to the case management team for assistance with care management and care coordination. The patient's primary care provider has been notified of our unsuccessful attempts to make or maintain contact with the patient. The care management team is pleased to engage with this patient at any time in the future should he/she be interested in assistance from the care management team.   Follow Up Plan:  We have been unable to make contact with the patient. The care management team is available to follow up with the patient after provider conversation with the patient regarding recommendation for care management engagement and subsequent re-referral to the care management team.   [facility]  Direct Dial: [phone]

## 2021-12-31 ENCOUNTER — Telehealth: Payer: Self-pay | Admitting: *Deleted

## 2021-12-31 ENCOUNTER — Other Ambulatory Visit (HOSPITAL_COMMUNITY): Payer: Self-pay

## 2021-12-31 NOTE — Telephone Encounter (Signed)
Thank you, will see her at her appointment next week. If she becomes symptomatic, would recommend presentation to the ED

## 2021-12-31 NOTE — Telephone Encounter (Signed)
Alicia Bud, RN with [name] called in to report patient's BP is 200/90. She is completely asymptomatic. She has taken her hydrlazine (patient thinks she took 1.5 tabs of 50 mg), and metoprolol but has not taken lasix or nifedipine. She has been rationing lasix as she has not had transportation to p/u refill at pharmacy. Glenard Haring will have her take lasix and nifedipine now. Please see phone note from 2/4. Patient has an appt next week with [facility] has made patient aware of this appt.

## 2022-01-03 ENCOUNTER — Inpatient Hospital Stay (HOSPITAL_COMMUNITY): Admission: RE | Admit: 2022-01-03 | Payer: Medicare HMO | Source: Ambulatory Visit

## 2022-01-03 ENCOUNTER — Other Ambulatory Visit (HOSPITAL_COMMUNITY): Payer: Self-pay

## 2022-01-07 ENCOUNTER — Ambulatory Visit (INDEPENDENT_AMBULATORY_CARE_PROVIDER_SITE_OTHER): Payer: Medicare HMO | Admitting: Internal Medicine

## 2022-01-07 DIAGNOSIS — E785 Hyperlipidemia, unspecified: Secondary | ICD-10-CM | POA: Diagnosis not present

## 2022-01-07 DIAGNOSIS — I110 Hypertensive heart disease with heart failure: Secondary | ICD-10-CM | POA: Diagnosis not present

## 2022-01-07 DIAGNOSIS — I10 Essential (primary) hypertension: Secondary | ICD-10-CM

## 2022-01-07 DIAGNOSIS — I5030 Unspecified diastolic (congestive) heart failure: Secondary | ICD-10-CM | POA: Diagnosis not present

## 2022-01-07 DIAGNOSIS — E119 Type 2 diabetes mellitus without complications: Secondary | ICD-10-CM

## 2022-01-07 MED ORDER — FUROSEMIDE 40 MG PO TABS: 40.0000 mg | ORAL_TABLET | Freq: Two times a day (BID) | ORAL | 1 refills | Status: DC

## 2022-01-07 MED ORDER — NIFEDIPINE ER 60 MG PO TB24: 60.0000 mg | ORAL_TABLET | Freq: Every day | ORAL | 3 refills | Status: DC

## 2022-01-07 MED ORDER — METOPROLOL SUCCINATE ER 50 MG PO TB24: 50.0000 mg | ORAL_TABLET | Freq: Every day | ORAL | 3 refills | Status: DC

## 2022-01-07 MED ORDER — HYDRALAZINE HCL 50 MG PO TABS: 75.0000 mg | ORAL_TABLET | Freq: Three times a day (TID) | ORAL | 3 refills | Status: DC

## 2022-01-07 NOTE — Assessment & Plan Note (Signed)
BP Readings from Last 3 Encounters:  01/07/22 (!) 179/78  12/10/21 (!) 192/70  12/03/21 (!) 207/83     Patient presents to the clinic for a 4 week follow up of her BP. At the last visit, her SBP was in the 190s and today it is in the 170s. She denies any headache, blurry vision, chest pain, palpitations, or any other sxs at this time. The patient notes that she has had some confusion with her BP medications and needs clarification on how to take them. She has been taking her metoprolol 50 mg daily and nifedipine 60 mg daily. She is also supposed to take hydralazine 75 mg tid, but she has only been taking hydralazine 50 mg bid, as she was unsure how to take this. At the last visit in January, PCP was concerned patient may have underlying primary aldosteronism. Will continue with saline challenge to further work this up, but also suspect that patient has underlying OSA (untreated) that could be contributing to her hypertension. Primary aldosteronism is not quite as likely, given her normal aldosterone level (ratio was elevated given her nearly undetectable renin level). STOP-BANG score at least 4, putting her at intermediate risk (snoring, age, BMI, and HTN diagnosis).  Counseled patient on exactly how to take her medications and she expresses understanding.  Plan: - Nifedipine 60 mg daily - Metoprolol 50 mg daily - Hydralazine 75 mg tid - Outpatient sleep study referral placed  - Reschedule saline challenge

## 2022-01-07 NOTE — Patient Instructions (Signed)
Thank you, [name] for allowing [place] to provide your care today. Today we discussed:  High blood pressure: Continue to take metoprolol 50 mg (1 tablet) each day. Continue to take nifedipine 60 mg (1 tablet) each day. Take hydralazine 75 mg (1.5 tablets) three times a day  Sleep apnea: You are at a moderate risk for sleep apnea. I have ordered a home sleep study to see if you do have this diagnosis and if so, we can get you a machine to help your breathing at night.   I have ordered the following labs for you:  Lab Orders  No laboratory test(s) ordered today     Tests ordered today:  Sleep study  Referrals ordered today:   Referral Orders  No referral(s) requested today     I have ordered the following medication/changed the following medications:   Stop the following medications: Medications Discontinued During This Encounter  Medication Reason   NIFEdipine (ADALAT CC) 60 MG 24 hr tablet Reorder   hydrALAZINE (APRESOLINE) 50 MG tablet Reorder   metoprolol succinate (TOPROL-XL) 50 MG 24 hr tablet Reorder   furosemide (LASIX) 40 MG tablet Reorder     Start the following medications: Meds ordered this encounter  Medications   hydrALAZINE (APRESOLINE) 50 MG tablet    Sig: Take 1.5 tablets (75 mg total) by mouth every 8 (eight) hours.    Dispense:  130 tablet    Refill:  3   metoprolol succinate (TOPROL-XL) 50 MG 24 hr tablet    Sig: Take 1 tablet (50 mg total) by mouth daily. Take with or immediately following a meal.    Dispense:  30 tablet    Refill:  3   NIFEdipine (ADALAT CC) 60 MG 24 hr tablet    Sig: Take 1 tablet (60 mg total) by mouth daily for high blood pressure.    Dispense:  30 tablet    Refill:  3   furosemide (LASIX) 40 MG tablet    Sig: Take 1 tablet (40 mg total) by mouth 2 (two) times daily.    Dispense:  60 tablet    Refill:  1     Follow up:  1 month     Remember: to call if you need any more refills of your blood pressure  medications  Should you have any questions or concerns please call the internal medicine clinic at [phone].     Buddy Duty, D.O. [place]

## 2022-01-07 NOTE — Progress Notes (Signed)
° °  CC: 4 week follow up HTN  HPI:  Alicia [name] is a 71 y.o. female with HFpEF, HTN, DM, and HLD who presents to the [facility] for a BP follow up. Please see problem-based list for further details, assessments, and plans.   Past Medical History:  Diagnosis Date   Allergy    Arthritis    Chronic headaches    Constipation    Depression    Diabetes mellitus    Hyperlipidemia    Hypertension    Obesity    PAD (peripheral artery disease) (HCC)    Shortness of breath    Stage 3 chronic kidney disease ([place])    Stroke ([place])    Review of Systems:  Review of Systems  Constitutional:  Negative for chills and fever.  HENT: Negative.    Eyes:  Negative for blurred vision.  Respiratory:  Negative for cough and shortness of breath.   Cardiovascular:  Positive for leg swelling. Negative for chest pain and palpitations.  Gastrointestinal:  Negative for diarrhea, nausea and vomiting.  Neurological:  Negative for dizziness, loss of consciousness and headaches.    Physical Exam:  Vitals:   01/07/22 1005  BP: (!) 179/78  Pulse: 86  SpO2: 96%  Weight: 265 lb 9.6 oz (120.5 kg)   General: Chronically ill, elderly female sitting in wheelchair. No acute distress. CV: RRR. No murmurs. Trace LE edema Pulmonary: Lungs CTAB. Normal effort. No crackles appreciated.  Abdominal: Soft, nontender, nondistended.  Skin: Warm and dry.  Neuro: A&Ox3. No focal deficit. Psych: Normal mood and affect   Assessment & Plan:   See Encounters Tab for problem based charting.  Patient discussed with Dr. Evette Doffing

## 2022-01-10 NOTE — Progress Notes (Signed)
Internal Medicine Clinic Attending ° °Case discussed with Dr. Atway  At the time of the visit.  We reviewed the resident’s history and exam and pertinent patient test results.  I agree with the assessment, diagnosis, and plan of care documented in the resident’s note.  °

## 2022-01-13 ENCOUNTER — Institutional Professional Consult (permissible substitution): Payer: Medicare HMO | Admitting: Behavioral Health

## 2022-01-13 ENCOUNTER — Other Ambulatory Visit: Payer: Self-pay

## 2022-01-13 ENCOUNTER — Telehealth: Payer: Self-pay | Admitting: Behavioral Health

## 2022-01-13 NOTE — Telephone Encounter (Signed)
Unable to lv Pt a msg due to phone continues to ring w/no VM option.  Dr. Theodis Shove

## 2022-01-20 ENCOUNTER — Telehealth: Payer: Self-pay | Admitting: *Deleted

## 2022-01-20 DIAGNOSIS — I10 Essential (primary) hypertension: Secondary | ICD-10-CM

## 2022-01-20 NOTE — Telephone Encounter (Signed)
Glenard Haring, RN with [facility], called in to report patient's BP today was 230/110. Only sx was "a little lightheaded." Patient had only taken her HCTZ when Remy arrived. She instructed her to take the nifedipine and metoprolol and called EMS. Patient adamantly refused transport to ED. Signed AMA paperwork. BP 235/110 when they left. Patient has upcoming appt on 3/17 with [facility]. ?

## 2022-01-20 NOTE — Telephone Encounter (Signed)
We can try to schedule her an appointment to discuss further. A sleep study was ordered and prior documentation alludes to her having OSA.  ?

## 2022-01-20 NOTE — Telephone Encounter (Signed)
Per [facility] patient will need a New Referral placed.  Please see message below and advise. ? ?From: Aldine Contes, MD ?Sent: 12/22/2021  11:06 AM EST ?To: Rick Duff, MD ?Subject: FW: Patient Referral                          ?  ?  ?----- Message ----- ?From: Sherryle Lis, NT ?Sent: 12/21/2021  12:04 PM EST ?To: Aldine Contes, MD ?Subject: Patient Referral                              ?  ?Hi Dr. Dareen Piano,  ?  ?Thank you for the referral for Alicia [name]. After reviewing her referral and chart we feel she would best seen by Pulmonary for any sleep concerns.  ?  ?Please let me know if you have any questions. ?  ?Thank you, ?  ?

## 2022-01-24 ENCOUNTER — Telehealth: Payer: Self-pay | Admitting: *Deleted

## 2022-01-24 NOTE — Telephone Encounter (Signed)
Received call from [place], [place] with [name]. Reporting today's BP 230/88. Patient was pulling in driveway same time as Glenard Haring arrived. Told Glenard Haring she was coming back from [employer]. Had not taken any AM Meds, and was not wearing oxygen. O2 Sat came up to 94% after 2 minutes of rest with O2 at 2 L. Angel administered all BP meds and repeat BP 30 minutes later was 220/80. Patient's only complaints were lightheadedness and dizziness. Patient adamantly refused to have [facility] call EMS or to go to ED. Patient's neighbor brought in her groceries for her: pork sausage, chocolate donuts, cherry pie. ? ?Glenard Haring states her last RN visit is next week and she will have to discharge her 2/2 non-adherence. States there is no cognitive difficulties, but patient will not grasp the importance of taking her meds as soon as she wakes. She has provided verbal and written instruction to no avail. ? ? ? ?

## 2022-01-24 NOTE — Telephone Encounter (Signed)
Thank you Lauren. If she will not take her medications and refuses to go to the ED there is nothing we can do if she has capacity to make her own decisions. I have an appointment with her in the coming weeks and will have conversation with her about all of this. Thank you ?

## 2022-01-31 DIAGNOSIS — I10 Essential (primary) hypertension: Secondary | ICD-10-CM | POA: Diagnosis not present

## 2022-01-31 DIAGNOSIS — I639 Cerebral infarction, unspecified: Secondary | ICD-10-CM | POA: Diagnosis not present

## 2022-01-31 DIAGNOSIS — I509 Heart failure, unspecified: Secondary | ICD-10-CM | POA: Diagnosis not present

## 2022-02-02 ENCOUNTER — Telehealth: Payer: Self-pay | Admitting: *Deleted

## 2022-02-02 NOTE — Telephone Encounter (Signed)
Call from [facility] from [place].  Patient's blood pressure is elevated today 180/80.  Is taking her medication as prescribed. Patient is unable t come in today for an appointment.  Patient can come in on Friday as scheduled.  Patient was advised to take her blood pressure meds as prescribed and to bring in her log book for her appointment on Friday and to write down the results of her blood pressures Patient is experiencing no symptoms as of now.   ? ? ? ?

## 2022-02-04 ENCOUNTER — Encounter: Payer: Self-pay | Admitting: Student

## 2022-02-04 ENCOUNTER — Ambulatory Visit (INDEPENDENT_AMBULATORY_CARE_PROVIDER_SITE_OTHER): Payer: Medicare HMO | Admitting: Student

## 2022-02-04 ENCOUNTER — Other Ambulatory Visit: Payer: Self-pay

## 2022-02-04 ENCOUNTER — Other Ambulatory Visit (HOSPITAL_COMMUNITY): Payer: Self-pay

## 2022-02-04 VITALS — BP 186/72 | HR 75 | Temp 98.4°F | Ht 67.0 in | Wt 264.1 lb

## 2022-02-04 DIAGNOSIS — I10 Essential (primary) hypertension: Secondary | ICD-10-CM

## 2022-02-07 NOTE — Progress Notes (Signed)
Patient left before being seen.

## 2022-02-09 ENCOUNTER — Telehealth: Payer: Self-pay | Admitting: Behavioral Health

## 2022-02-09 ENCOUNTER — Institutional Professional Consult (permissible substitution): Payer: Medicare HMO | Admitting: Behavioral Health

## 2022-02-09 NOTE — Telephone Encounter (Signed)
Unable to lv msg for Pt about today's IBH session or info to [facility] & r/s visit. Pt can call to schedule. ? ?Dr. Theodis Shove ?

## 2022-02-11 ENCOUNTER — Encounter: Payer: Medicare HMO | Admitting: Student

## 2022-02-16 ENCOUNTER — Encounter: Payer: Self-pay | Admitting: Student

## 2022-02-16 ENCOUNTER — Ambulatory Visit (INDEPENDENT_AMBULATORY_CARE_PROVIDER_SITE_OTHER): Payer: Medicare HMO | Admitting: Student

## 2022-02-16 VITALS — BP 180/71 | HR 77 | Temp 98.2°F | Ht 67.0 in | Wt 265.5 lb

## 2022-02-16 DIAGNOSIS — E1122 Type 2 diabetes mellitus with diabetic chronic kidney disease: Secondary | ICD-10-CM | POA: Diagnosis not present

## 2022-02-16 DIAGNOSIS — I5032 Chronic diastolic (congestive) heart failure: Secondary | ICD-10-CM

## 2022-02-16 DIAGNOSIS — G8929 Other chronic pain: Secondary | ICD-10-CM | POA: Diagnosis not present

## 2022-02-16 DIAGNOSIS — I509 Heart failure, unspecified: Secondary | ICD-10-CM | POA: Diagnosis not present

## 2022-02-16 DIAGNOSIS — E669 Obesity, unspecified: Secondary | ICD-10-CM

## 2022-02-16 DIAGNOSIS — I130 Hypertensive heart and chronic kidney disease with heart failure and stage 1 through stage 4 chronic kidney disease, or unspecified chronic kidney disease: Secondary | ICD-10-CM

## 2022-02-16 DIAGNOSIS — N1831 Chronic kidney disease, stage 3a: Secondary | ICD-10-CM

## 2022-02-16 DIAGNOSIS — R519 Headache, unspecified: Secondary | ICD-10-CM | POA: Diagnosis not present

## 2022-02-16 DIAGNOSIS — R29898 Other symptoms and signs involving the musculoskeletal system: Secondary | ICD-10-CM

## 2022-02-16 DIAGNOSIS — E1169 Type 2 diabetes mellitus with other specified complication: Secondary | ICD-10-CM

## 2022-02-16 DIAGNOSIS — I10 Essential (primary) hypertension: Secondary | ICD-10-CM

## 2022-02-16 LAB — BRAIN NATRIURETIC PEPTIDE: B Natriuretic Peptide: 583.3 pg/mL — ABNORMAL HIGH (ref 0.0–100.0)

## 2022-02-16 MED ORDER — SPIRONOLACTONE 25 MG PO TABS: 25.0000 mg | ORAL_TABLET | Freq: Every day | ORAL | 11 refills | Status: DC

## 2022-02-16 MED ORDER — FUROSEMIDE 40 MG PO TABS: 80.0000 mg | ORAL_TABLET | Freq: Two times a day (BID) | ORAL | 0 refills | Status: DC

## 2022-02-16 MED ORDER — AMLODIPINE BESYLATE 10 MG PO TABS: 10.0000 mg | ORAL_TABLET | Freq: Every day | ORAL | 11 refills | Status: DC

## 2022-02-16 NOTE — Assessment & Plan Note (Addendum)
Assessment: ?Hx of HFpEF, most recent EF 55-60%. She has been taking her lasix inconsistently. Suspect increase swelling from not taking lasix. Prescribed regimen of 40 mg BID. She denies shortness of breath or requiring increasing amounts of home O2 she wears with ambulation and activity. She does endorse increased swelling.  ? ?On physical exam she has JVD to the edge of the mandible. Diffuse crackles. 2+RLE edema and trace-1+ on the left.  ?  ?Her weight is not significantly up since last visit in our clinic. Discharge weight after HF admission was 123 kg. Around 119-120 kg today.  Will increase lasix to 80 mg BID for 7 days and have her come back in a week to reassess. Instructed to call clinic if she is requiring more oxygen or has any other new symptoms . ? ?Plan: ?-Increase lasix to 80 mg BID ?-follow up in clinic next week ? ?Addendum: BNP elevated to 500's, last time admitted it was in the 600's.  ?

## 2022-02-16 NOTE — Assessment & Plan Note (Addendum)
Assessment: ?Hx of uncontrolled hypertension. Looking back into the chart does not appear to have ever been controlled. When on spironolactone her BP was 140/80's, which is the lowest recorded reading in some time. This was taken off secondary to her CKD. Home BP readings have been in the 174'Y systolically. ? ?Her current regimen is hydralazine 50 mg TID, nifedipine 60 mg mg daily, irbesartan daily, metoprolol 50 mg daily, and lasix 40 mg bid. She has not been taking her irbesartan and has been taking her lasix inconsistently.  ? ?She has had a secondary hypertension workup and had a decreased renin level (inapprpraitely low if she was taking her arb) and normal aldosterone levels. She has difficulty with transportation, I believe it would be best to trial her on spironolactone and see if she has improvement in BP. Will also discontinue her irbesartan to decrease the risk of hyperkalemia. Other possible causes are undiagnosed OSA which she has a sleep study scheduled for next month. There has also been question of her adherence, today she brought in her medications and we reviewed them together  ? ?Will also discontinue nifedipine (short acting CCB) and switch her to amlodipine, which she was also on in the past. I am unable to find documentation to describe why she was originally take off of amlodipine.  ? ?If significant improvement with spiro, can consider adrenal CT to rule out adenoma vs hyperplasia. She had a CT a/p w/o c in the past without any adrenal abnormalities noted. On my personal review of the scan do not see any abnormalities. ? ?Plan: ?-continue metoprolol XL 50 mg daily ?-start amlodipine '10mg'$  and spironolactone 25 mg ?-discontinue irbesartan and nifedipine ?-BMP pending today, follow up in one week for repeat lab work ?-will also reach out to her nephrologist with her history of CKD ? ?

## 2022-02-16 NOTE — Assessment & Plan Note (Addendum)
Reached out to patient's nephrologist, she has had multiple missed appointments and will need to call the clinic to reschedule.  ? ?Addendum: GFR of 22, last gfr of 24 during last visit. 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 Will need to call nephrology and schedule an appointment to see them.  ?

## 2022-02-16 NOTE — Patient Instructions (Signed)
Thank you, [name] for allowing [place] to provide your care today. Today we discussed . ? ? ?High Blood Pressure ?Please continue to take your metoprolol and  lasix ?We are adding Amlodipine and Spironolactone  ?Stop taking Nifedipine and Hydralazine ? ?Headache ?We will be checking some labs today for your headache. ? ?Heart Failure/Swelling ?Please take 2 of your lasix in the morning and in the  ?evening for 7 days.  ? ?Please call and schedule an appointment to see your kidney doctor ? ?I have ordered the following labs for you: ? ? ?Lab Orders    ?     BMP8+Anion Gap    ?     Brain natriuretic peptide    ?     Sed Rate (ESR)    ?     CBC with Diff    ?     CRP (C-Reactive Protein)     ? ? ? ?Referrals ordered today:  ? ?Referral Orders  ?No referral(s) requested today  ?  ? ?I have ordered the following medication/changed the following medications:  ? ?Stop the following medications: ?Medications Discontinued During This Encounter  ?Medication Reason  ? hydrALAZINE (APRESOLINE) 50 MG tablet   ? NIFEdipine (ADALAT CC) 60 MG 24 hr tablet   ? furosemide (LASIX) 40 MG tablet Reorder  ?  ? ?Start the following medications: ?Meds ordered this encounter  ?Medications  ? furosemide (LASIX) 40 MG tablet  ?  Sig: Take 2 tablets (80 mg total) by mouth 2 (two) times daily for 10 days.  ?  Dispense:  40 tablet  ?  Refill:  0  ? amLODipine (NORVASC) 10 MG tablet  ?  Sig: Take 1 tablet (10 mg total) by mouth daily.  ?  Dispense:  30 tablet  ?  Refill:  11  ? spironolactone (ALDACTONE) 25 MG tablet  ?  Sig: Take 1 tablet (25 mg total) by mouth daily.  ?  Dispense:  30 tablet  ?  Refill:  11  ?  ? ?Follow up:  7 days   ? ? ?Should you have any questions or concerns please call the internal medicine clinic at [phone].   ? ?Sanjuana Letters, D.O. ?[place] ?  ?

## 2022-02-16 NOTE — Progress Notes (Signed)
? ?CC: Follow up hypertension ? ?HPI: ? ?Alicia [name] is a 71 y.o. female living with a history stated below and presents today for follow up to discuss her hypertension. Please see problem based assessment and plan for additional details. ? ?Past Medical History:  ?Diagnosis Date  ? Allergy   ? Arthritis   ? Chronic headaches   ? Constipation   ? Depression   ? Diabetes mellitus   ? Hyperlipidemia   ? Hypertension   ? Obesity   ? PAD (peripheral artery disease) ([place])   ? Shortness of breath   ? Stage 3 chronic kidney disease ([place])   ? Stroke [facility])   ? ? ?Current Outpatient Medications on File Prior to Visit  ?Medication Sig Dispense Refill  ? acetaminophen (TYLENOL) 500 MG tablet Take 2 tablets (1,000 mg total) by mouth every 8 (eight) hours as needed for moderate pain or headache. 30 tablet 0  ? albuterol (VENTOLIN HFA) 108 (90 Base) MCG/ACT inhaler Inhale 2 puffs into the lungs every 6 (six) hours as needed for wheezing or shortness of breath. 18 g 2  ? aspirin 81 MG EC tablet Take 1 tablet (81 mg total) by mouth daily. 30 tablet 0  ? blood glucose meter kit and supplies per insurance preference. Check cbg once a day. Dx E11.9 1 each 11  ? diclofenac Sodium (VOLTAREN) 1 % GEL Apply 2 g topically 3 (three) times daily as needed (Pain). 200 g 0  ? metoprolol succinate (TOPROL-XL) 50 MG 24 hr tablet Take 1 tablet (50 mg total) by mouth daily. Take with or immediately following a meal. 30 tablet 3  ? OXYGEN Inhale 2 L into the lungs as needed (oxygen).    ? rosuvastatin (CRESTOR) 10 MG tablet Take 1 tablet (10 mg total) by mouth daily. 30 tablet 0  ? sitaGLIPtin (JANUVIA) 25 MG tablet Take 1 tablet (25 mg total) by mouth daily. 90 tablet 0  ? ?No current facility-administered medications on file prior to visit.  ? ? ?Family History  ?Problem Relation Age of Onset  ? Diabetes Father   ? Heart disease Mother   ?     NOT before age 63-  Bypass  ? Hypertension Mother   ? Hypertension Sister   ? Varicose Veins  Brother   ? Heart disease Brother   ?     Before age 89  ? Hypertension Daughter   ? Colon cancer Neg Hx   ? Esophageal cancer Neg Hx   ? Rectal cancer Neg Hx   ? Stomach cancer Neg Hx   ? ? ?Social History  ? ?Socioeconomic History  ? Marital status: Single  ?  Spouse name: Not on file  ? Number of children: Not on file  ? Years of education: Not on file  ? Highest education level: Not on file  ?Occupational History  ? Not on file  ?Tobacco Use  ? Smoking status: Light Smoker  ?  Packs/day: 0.50  ?  Years: 35.00  ?  Pack years: 17.50  ?  Types: Cigarettes  ? Smokeless tobacco: Never  ? Tobacco comments:  ?  patient is trying to quit and has gone down to half a pack per day  ?Vaping Use  ? Vaping Use: Never used  ?Substance and Sexual Activity  ? Alcohol use: Yes  ?  Alcohol/week: 1.0 standard drink  ?  Types: 1 Standard drinks or equivalent per week  ?  Comment: occasional use only  ?  Drug use: No  ? Sexual activity: Not Currently  ?Other Topics Concern  ? Not on file  ?Social History Narrative  ? Not on file  ? ?Social Determinants of Health  ? ?Financial Resource Strain: Not on file  ?Food Insecurity: No Food Insecurity  ? Worried About Charity fundraiser in the Last Year: Never true  ? Ran Out of Food in the Last Year: Never true  ?Transportation Needs: Unmet Transportation Needs  ? Lack of Transportation (Medical): Yes  ? Lack of Transportation (Non-Medical): Yes  ?Physical Activity: Inactive  ? Days of Exercise per Week: 0 days  ? Minutes of Exercise per Session: 0 min  ?Stress: Not on file  ?Social Connections: Not on file  ?Intimate Partner Violence: Not on file  ? ? ?Review of Systems: ?ROS negative except for what is noted on the assessment and plan. ? ?Vitals:  ? 02/16/22 0900  ?BP: (!) 180/71  ?Pulse: 77  ?Temp: 98.2 ?F (36.8 ?C)  ?TempSrc: Oral  ?SpO2: 95%  ?Weight: 265 lb 8 oz (120.4 kg)  ?Height: 5' 7"  (1.702 m)  ? ? ?Physical Exam: ?Constitutional: no acute distress ?HENT: normocephalic  atraumatic ?Eyes: conjunctiva non-erythematous ?Neck: supple ?Cardiovascular: regular rate and rhythm, no m/r/g. JVD to angle of mandible ?Pulmonary/Chest: normal work of breathing on room air, diffuse crackles ?Abdominal: soft, non-tender, non-distended ?MSK: normal bulk and tone. 2+ pitting edema on left lower extremity, trace on the right lower extremity ?Neurological: alert & oriented x  ?Skin: warm and dry ?Psych: normal mood and thought process ? ?Assessment & Plan:  ? ?Severe uncontrolled hypertension ?Assessment: ?Hx of uncontrolled hypertension. Looking back into the chart does not appear to have ever been controlled. When on spironolactone her BP was 140/80's, which is the lowest recorded reading in some time. This was taken off secondary to her CKD. Home BP readings have been in the 790'W systolically. ? ?Her current regimen is hydralazine 50 mg TID, nifedipine 60 mg mg daily, irbesartan daily, metoprolol 50 mg daily, and lasix 40 mg bid. She has not been taking her irbesartan and has been taking her lasix inconsistently.  ? ?She has had a secondary hypertension workup and had a decreased renin level (inapprpraitely low if she was taking her arb) and normal aldosterone levels. She has difficulty with transportation, I believe it would be best to trial her on spironolactone and see if she has improvement in BP. Will also discontinue her irbesartan to decrease the risk of hyperkalemia. Other possible causes are undiagnosed OSA which she has a sleep study scheduled for next month. There has also been question of her adherence, today she brought in her medications and we reviewed them together  ? ?Will also discontinue nifedipine (short acting CCB) and switch her to amlodipine, which she was also on in the past. I am unable to find documentation to describe why she was originally take off of amlodipine.  ? ?If significant improvement with spiro, can consider adrenal CT to rule out adenoma vs hyperplasia. She  had a CT a/p w/o c in the past without any adrenal abnormalities noted. On my personal review of the scan do not see any abnormalities. ? ?Plan: ?-continue metoprolol XL 50 mg daily ?-start amlodipine 79m and spironolactone 25 mg ?-discontinue irbesartan and nifedipine ?-BMP pending today, follow up in one week for repeat lab work ?-will also reach out to her nephrologist with her history of CKD ? ? ?(HFpEF) heart failure with preserved ejection fraction [place] ?  Assessment: ?Hx of HFpEF, most recent EF 55-60%. She has been taking her lasix inconsistently. Suspect increase swelling from not taking lasix. Prescribed regimen of 40 mg BID. She denies shortness of breath or requiring increasing amounts of home O2 she wears with ambulation and activity. She does endorse increased swelling.  ? ?On physical exam she has JVD to the edge of the mandible. Diffuse crackles. 2+RLE edema and trace-1+ on the left.  ?  ?Her weight is not significantly up since last visit in our clinic. Discharge weight after HF admission was 123 kg. Around 119-120 kg today.  Will increase lasix to 80 mg BID for 7 days and have her come back in a week to reassess. Instructed to call clinic if she is requiring more oxygen or has any other new symptoms . ? ?Plan: ?-Increase lasix to 80 mg BID ?-follow up in clinic next week ? ?Headache ?Assessment: ?Endorses right sided headache localized at her temple for the past few months. The pain is worse with movement. She is tender to palpate over her temporal bone on the right. No other localized tenderness. She denies any jaw pain or difficulty eating. She has had some vision changes, however, I suspect this is secondary to fluctuations in her blood pressure. With chronicity and location, will check ESR and CRP.  ? ?Plan: ?-ESR and CRP pending ? ?Patient discussed with Dr. Dareen Piano ? ?Sanjuana Letters, D.O. ?[facility], PGY-2 ?Pager: [phone], Phone: [phone] ?Date 02/16/2022 Time  1:33 PM  ?

## 2022-02-16 NOTE — Assessment & Plan Note (Addendum)
Assessment: ?Endorses right sided headache localized at her temple for the past few months. The pain is worse with movement. She is tender to palpate over her temporal bone on the right. No other localized tenderness. She denies any jaw pain or difficulty eating. She has had some vision changes, however, I suspect this is secondary to fluctuations in her blood pressure. With chronicity and location, will check ESR and CRP.  ? ?Plan: ?-ESR and CRP pending ? ?Addendum: ?ESR of 64 and CRP of 42. Upon chart review in 2019 she had jaw claudication and blurry vision. An ESR was elevated at that time and it was recommended she see her eye doctor. I do not see any documented follow up on this. Due to the chronicity do not believe she needs emergent treatment or biopsy, will refer to rheumatology and reach out to [facility] online consulting service since we do not have rheum on call.  ?

## 2022-02-17 ENCOUNTER — Telehealth: Payer: Self-pay | Admitting: Student

## 2022-02-17 LAB — CBC WITH DIFFERENTIAL/PLATELET
Basophils Absolute: 0 10*3/uL (ref 0.0–0.2)
Basos: 1 %
EOS (ABSOLUTE): 0.1 10*3/uL (ref 0.0–0.4)
Eos: 2 %
Hematocrit: 35.8 % (ref 34.0–46.6)
Hemoglobin: 11.2 g/dL (ref 11.1–15.9)
Immature Grans (Abs): 0 10*3/uL (ref 0.0–0.1)
Immature Granulocytes: 0 %
Lymphocytes Absolute: 0.8 10*3/uL (ref 0.7–3.1)
Lymphs: 10 %
MCH: 28.2 pg (ref 26.6–33.0)
MCHC: 31.3 g/dL — ABNORMAL LOW (ref 31.5–35.7)
MCV: 90 fL (ref 79–97)
Monocytes Absolute: 0.4 10*3/uL (ref 0.1–0.9)
Monocytes: 5 %
Neutrophils Absolute: 6.8 10*3/uL (ref 1.4–7.0)
Neutrophils: 82 %
Platelets: 314 10*3/uL (ref 150–450)
RBC: 3.97 x10E6/uL (ref 3.77–5.28)
RDW: 15.4 % (ref 11.7–15.4)
WBC: 8.2 10*3/uL (ref 3.4–10.8)

## 2022-02-17 LAB — BMP8+ANION GAP
Anion Gap: 16 mmol/L (ref 10.0–18.0)
BUN/Creatinine Ratio: 10 — ABNORMAL LOW (ref 12–28)
BUN: 24 mg/dL (ref 8–27)
CO2: 22 mmol/L (ref 20–29)
Calcium: 8.3 mg/dL — ABNORMAL LOW (ref 8.7–10.3)
Chloride: 102 mmol/L (ref 96–106)
Creatinine, Ser: 2.35 mg/dL — ABNORMAL HIGH (ref 0.57–1.00)
Glucose: 136 mg/dL — ABNORMAL HIGH (ref 70–99)
Potassium: 3.7 mmol/L (ref 3.5–5.2)
Sodium: 140 mmol/L (ref 134–144)
eGFR: 22 mL/min/{1.73_m2} — ABNORMAL LOW (ref >59)

## 2022-02-17 LAB — C-REACTIVE PROTEIN: CRP: 42 mg/L — ABNORMAL HIGH (ref 0–10)

## 2022-02-17 LAB — SEDIMENTATION RATE: Sed Rate: 64 mm/hr — ABNORMAL HIGH (ref 0–40)

## 2022-02-17 NOTE — Telephone Encounter (Signed)
Attempted to call patient x2 to follow up on lab results and see how she is doing on increased lasix dose.  ?

## 2022-02-17 NOTE — Progress Notes (Signed)
Internal Medicine Clinic Attending  Case discussed with Dr. Katsadouros  At the time of the visit.  We reviewed the resident's history and exam and pertinent patient test results.  I agree with the assessment, diagnosis, and plan of care documented in the resident's note.  

## 2022-02-21 ENCOUNTER — Other Ambulatory Visit: Payer: Self-pay | Admitting: Internal Medicine

## 2022-02-21 DIAGNOSIS — G8929 Other chronic pain: Secondary | ICD-10-CM

## 2022-02-21 MED ORDER — PREDNISONE 20 MG PO TABS: 40.0000 mg | ORAL_TABLET | Freq: Every day | ORAL | 0 refills | Status: DC

## 2022-02-21 NOTE — Progress Notes (Signed)
Patient reports continued headache with lightheadedness/dizziness. Discussed elevated ESR and CRP and concern for GCA. Recommended starting prednisone 56m daily and will send referral to [facility] for temporal artery biopsy within 2 weeks. Patient expresses understanding. No questions at this time. She has appointment upcoming on 4/5 with Dr NAlfonse Spruce  ?

## 2022-02-23 ENCOUNTER — Encounter: Payer: Self-pay | Admitting: Student

## 2022-02-23 ENCOUNTER — Ambulatory Visit (INDEPENDENT_AMBULATORY_CARE_PROVIDER_SITE_OTHER): Payer: Medicare HMO | Admitting: Student

## 2022-02-23 VITALS — BP 153/129 | HR 78 | Temp 98.3°F | Ht 67.0 in | Wt 260.2 lb

## 2022-02-23 DIAGNOSIS — Z720 Tobacco use: Secondary | ICD-10-CM

## 2022-02-23 DIAGNOSIS — M316 Other giant cell arteritis: Secondary | ICD-10-CM

## 2022-02-23 DIAGNOSIS — R519 Headache, unspecified: Secondary | ICD-10-CM

## 2022-02-23 DIAGNOSIS — I5033 Acute on chronic diastolic (congestive) heart failure: Secondary | ICD-10-CM

## 2022-02-23 DIAGNOSIS — I130 Hypertensive heart and chronic kidney disease with heart failure and stage 1 through stage 4 chronic kidney disease, or unspecified chronic kidney disease: Secondary | ICD-10-CM

## 2022-02-23 DIAGNOSIS — I10 Essential (primary) hypertension: Secondary | ICD-10-CM | POA: Diagnosis not present

## 2022-02-23 DIAGNOSIS — G8929 Other chronic pain: Secondary | ICD-10-CM | POA: Diagnosis not present

## 2022-02-23 DIAGNOSIS — N184 Chronic kidney disease, stage 4 (severe): Secondary | ICD-10-CM | POA: Diagnosis not present

## 2022-02-23 MED ORDER — NICOTINE 14 MG/24HR TD PT24: 14.0000 mg | MEDICATED_PATCH | TRANSDERMAL | 0 refills | Status: AC

## 2022-02-23 MED ORDER — FUROSEMIDE 80 MG PO TABS
80.0000 mg | ORAL_TABLET | Freq: Two times a day (BID) | ORAL | 11 refills | Status: DC
Start: 2022-02-23 — End: 2022-03-24

## 2022-02-23 NOTE — Assessment & Plan Note (Signed)
She smokes 8 cigarettes a day for many years. ? ?-Nicotine patch ordered ?

## 2022-02-23 NOTE — Assessment & Plan Note (Addendum)
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  High-dose prednisone order and pending temporal biopsy within 2 weeks.  Recommendation given through rheumatologist with [facility]. ? ?Interestingly her ESR was elevated back in 2019.  She also have similar headache presentation but failed to follow-up with additional work-up.  She also reports joint pain and muscle pain, in which she refused to let us use a blood pressure cuff on her arm.  There could be a possibility of PMR. ? ?- Obtain CK level ?- Place referral to rheumatology ?- Pending temporal biopsy result ?- Prednisone 40 mg daily for 1 month ? ?Addendum ?CK normal ?

## 2022-02-23 NOTE — Assessment & Plan Note (Addendum)
Blood pressure 153/129 but was not measured on her leg.  Her elevated blood pressure could be due to volume overload and possible giant cell arteritis. ? ?She reports feeling sick after starting amlodipine and spironolactone.  Said that she does feel tired and fatigue in the morning. ? ?She has several work-up for secondary hypertension done.  Aldo/renal not consistent with hyperaldosteronism.  No renal artery stenosis seen on ultrasound.  She had a CT abdomen in 2020 was negative for adrenal mass.  Pending sleep study scheduled 4/18. ? ?-Continue metoprolol Xl 50 mg daily ?-Advised patient to resume amlodipine/spironolactone individually and she will let us know which one makes her feel bad. ?-Repeat BMP today ?-Given she is now CKD 4, will place referral for nephrology. ? ?Addendum ?BMP unchanged ?

## 2022-02-23 NOTE — Progress Notes (Signed)
[facility] with response: Recommend biopsy of left temporal artery and initiation of high-dose steroids as there is risk of sudden vision loss.  Thought that with her temporal pain and previous history highly suggestive of GCA with elevated inflammatory markers it is very concerning for GCA.  She was started on high-dose prednisone by Dr. Marva Panda on 02/21/2021.  Urgent vascular referral placed, she will need temporal artery biopsy within 2 weeks.  She also saw Dr. Alfonse Spruce on 4/5 in our clinic. ?

## 2022-02-23 NOTE — Assessment & Plan Note (Signed)
-   Referral to nephrology placed ? ?

## 2022-02-23 NOTE — Patient Instructions (Addendum)
[name], ? ?It was a pleasure seeing you in the clinic today.  Here is a summary of what we talked about: ? ?1.  Heart failure: Overall your volume status has improved.  Please continue Lasix 80 mg twice daily given your worsening kidney function.  I will recheck your kidney function today. ? ?2.  High blood pressure: Please continue metoprolol.  Please resume amlodipine and spironolactone slowly and let us know which one causing you to feel bad. ? ?3.  Please start taking prednisone for your headache.  They will call you for an appointment to get a biopsy. ? ?Please return in 2-3 weeks for follow-up on your blood pressure and shortness of breath. ? ?Take care, ? ?Dr. Alfonse Spruce ?

## 2022-02-23 NOTE — Progress Notes (Signed)
? ?  CC: Follow-up for HFpEF exacerbation ? ?HPI: ? ?Alicia [name] is a 71 y.o. with past medical history of hypertension, CVA, HFpEF, type 2 diabetes, CKD 4, who presents to the clinic for 1 week follow-up of her HFpEF exacerbation. ? ?Please see problem based charting for detail. ? ?Past Medical History:  ?Diagnosis Date  ? Allergy   ? Arthritis   ? Chronic headaches   ? Constipation   ? Depression   ? Diabetes mellitus   ? Hyperlipidemia   ? Hypertension   ? Obesity   ? PAD (peripheral artery disease) ([place])   ? Shortness of breath   ? Stage 3 chronic kidney disease ([place])   ? Stroke [facility])   ? ?Review of Systems: Per HPI ? ?Physical Exam: ? ?Vitals:  ? 02/23/22 1058  ?BP: (!) 153/129  ?Pulse: 78  ?Temp: 98.3 ?F (36.8 ?C)  ?TempSrc: Oral  ?SpO2: 92%  ?Weight: 260 lb 3.2 oz (118 kg)  ?Height: '5\' 7"'$  (1.702 m)  ? ?Physical Exam ?Constitutional:   ?   General: She is not in acute distress. ?   Comments: Tired appearance  ?HENT:  ?   Head: Normocephalic.  ?Eyes:  ?   General:     ?   Right eye: No discharge.     ?   Left eye: No discharge.  ?   Conjunctiva/sclera: Conjunctivae normal.  ?Cardiovascular:  ?   Rate and Rhythm: Normal rate and regular rhythm.  ?   Comments: No obvious JVD ?+1 edema bilateral lower extremity, left worse than right ?Pulmonary:  ?   Effort: Pulmonary effort is normal.  ?   Comments: Very mild crackles heard at the left lung base.  Otherwise clear to auscultation.  No wheezing or rhonchi. ?Musculoskeletal:     ?   General: No deformity.  ?Skin: ?   General: Skin is warm.  ?Neurological:  ?   General: No focal deficit present.  ?   Mental Status: She is alert.  ?Psychiatric:     ?   Mood and Affect: Mood normal.  ?  ? ?Assessment & Plan:  ? ?See Encounters Tab for problem based charting. ? ?Patient discussed with Dr. Heber [place]  ?

## 2022-02-23 NOTE — Assessment & Plan Note (Addendum)
Here for 1 week follow-up for HFpEF exacerbation.  Lasix will increase from 40 to 80 mg twice daily.  Reports that her dyspnea with exertion has improved but has not gone away.  She still used 2 L of stable oxygen at home intermittently.  Said the swelling has improved as well.  Report good urine output with increased dose of Lasix. ? ?Her weight came down from 265 to 260 pound today.  Her lowest weight was 245 in 2021 otherwise stay in the 270s. ? ?Her last echocardiogram in 2022 showed normal EF with LVH.  Can have history of nonadherence to Lasix which could be the cause of her exacerbation. ? ?Overall volume status has improved.  Could not appreciate any JVD.  Lungs are mostly clear to auscultation.  Patient satting well on room air. ? ?-Continue Lasix 80 mg twice daily.  She will need this high dose given her worsening CKD. ?-Follow-up in 2-3 weeks ?-Her tobacco use can also contribute to her dyspnea on exertion.  No evidence of COPD exacerbation.  Counseled patient on cessation.  Nicotine patch ordered. ?

## 2022-02-24 NOTE — Progress Notes (Signed)
Internal Medicine Clinic Attending  Case discussed with Dr. Nguyen  At the time of the visit.  We reviewed the resident's history and exam and pertinent patient test results.  I agree with the assessment, diagnosis, and plan of care documented in the resident's note. 

## 2022-02-25 LAB — BMP8+ANION GAP
Anion Gap: 21 mmol/L — ABNORMAL HIGH (ref 10.0–18.0)
BUN/Creatinine Ratio: 10 — ABNORMAL LOW (ref 12–28)
BUN: 24 mg/dL (ref 8–27)
CO2: 20 mmol/L (ref 20–29)
Calcium: 8.1 mg/dL — ABNORMAL LOW (ref 8.7–10.3)
Chloride: 103 mmol/L (ref 96–106)
Creatinine, Ser: 2.34 mg/dL — ABNORMAL HIGH (ref 0.57–1.00)
Glucose: 104 mg/dL — ABNORMAL HIGH (ref 70–99)
Potassium: 3.8 mmol/L (ref 3.5–5.2)
Sodium: 144 mmol/L (ref 134–144)
eGFR: 22 mL/min/{1.73_m2} — ABNORMAL LOW (ref >59)

## 2022-02-25 LAB — CK: Total CK: 57 U/L (ref 32–182)

## 2022-03-03 DIAGNOSIS — I639 Cerebral infarction, unspecified: Secondary | ICD-10-CM | POA: Diagnosis not present

## 2022-03-03 DIAGNOSIS — I509 Heart failure, unspecified: Secondary | ICD-10-CM | POA: Diagnosis not present

## 2022-03-03 DIAGNOSIS — I10 Essential (primary) hypertension: Secondary | ICD-10-CM | POA: Diagnosis not present

## 2022-03-08 ENCOUNTER — Encounter: Payer: Self-pay | Admitting: Pulmonary Disease

## 2022-03-08 ENCOUNTER — Ambulatory Visit (INDEPENDENT_AMBULATORY_CARE_PROVIDER_SITE_OTHER): Payer: Medicare HMO | Admitting: Pulmonary Disease

## 2022-03-08 ENCOUNTER — Ambulatory Visit (INDEPENDENT_AMBULATORY_CARE_PROVIDER_SITE_OTHER): Payer: Medicare HMO | Admitting: Vascular Surgery

## 2022-03-08 ENCOUNTER — Encounter: Payer: Self-pay | Admitting: Vascular Surgery

## 2022-03-08 VITALS — BP 130/90 | HR 75 | Ht 67.0 in | Wt 262.0 lb

## 2022-03-08 VITALS — BP 158/75 | HR 78 | Temp 98.6°F | Resp 20 | Ht 67.0 in | Wt 262.0 lb

## 2022-03-08 DIAGNOSIS — R519 Headache, unspecified: Secondary | ICD-10-CM

## 2022-03-08 DIAGNOSIS — G4719 Other hypersomnia: Secondary | ICD-10-CM | POA: Diagnosis not present

## 2022-03-08 DIAGNOSIS — R42 Dizziness and giddiness: Secondary | ICD-10-CM | POA: Diagnosis not present

## 2022-03-08 NOTE — Progress Notes (Signed)
? ?      ?Alicia [name]    [id number]    06-02-1951 ? ?Primary Care Physician:Carter, Joneen Boers, MD ? ?Referring Physician: Aldine Contes, MD ?[address], [address] ?[place],  [place] [id number] ? ?Chief complaint:   ?Multiple awakenings, snoring, nonrestorative sleep ? ?HPI: ? ?She wakes up in the morning not feeling like she is rested well ?Wakes up multiple times during the night ? ?Usually goes to bed about midnight, takes about an hour to fall asleep ?About 4 awakenings ?Out of bed usually about 8 to 9 AM ? ?Admits to occasional dryness of the mouth ?No witnessed apneas ?Infrequent headaches ?No night sweats ? ?No family history of sleep apnea known to her ? ?Had a stroke about 3 years ago ?Does have a history of heart failure with preserved ejection fraction ?Peripheral vascular disease, hypertension, reactive airway dysfunction, chronic kidney disease, diabetes ? ?An active smoker trying to cut down, she is down to less than half a pack a day ? ?Denies known family history of sleep apnea ? ? ?Outpatient Encounter Medications as of 03/08/2022  ?Medication Sig  ? acetaminophen (TYLENOL) 500 MG tablet Take 2 tablets (1,000 mg total) by mouth every 8 (eight) hours as needed for moderate pain or headache.  ? albuterol (VENTOLIN HFA) 108 (90 Base) MCG/ACT inhaler Inhale 2 puffs into the lungs every 6 (six) hours as needed for wheezing or shortness of breath.  ? amLODipine (NORVASC) 10 MG tablet Take 1 tablet (10 mg total) by mouth daily.  ? aspirin 81 MG EC tablet Take 1 tablet (81 mg total) by mouth daily.  ? blood glucose meter kit and supplies per insurance preference. Check cbg once a day. Dx E11.9  ? diclofenac Sodium (VOLTAREN) 1 % GEL Apply 2 g topically 3 (three) times daily as needed (Pain).  ? furosemide (LASIX) 80 MG tablet Take 1 tablet (80 mg total) by mouth 2 (two) times daily.  ? nicotine (NICODERM CQ - DOSED IN MG/24 HOURS) 14 mg/24hr patch Place 1 patch (14 mg total) onto the skin  daily.  ? OXYGEN Inhale 2 L into the lungs as needed (oxygen).  ? predniSONE (DELTASONE) 20 MG tablet Take 2 tablets (40 mg total) by mouth daily with breakfast.  ? spironolactone (ALDACTONE) 25 MG tablet Take 1 tablet (25 mg total) by mouth daily.  ? metoprolol succinate (TOPROL-XL) 50 MG 24 hr tablet Take 1 tablet (50 mg total) by mouth daily. Take with or immediately following a meal.  ? rosuvastatin (CRESTOR) 10 MG tablet Take 1 tablet (10 mg total) by mouth daily.  ? sitaGLIPtin (JANUVIA) 25 MG tablet Take 1 tablet (25 mg total) by mouth daily.  ? ?No facility-administered encounter medications on file as of 03/08/2022.  ? ? ?Allergies as of 03/08/2022 - Review Complete 02/23/2022  ?Allergen Reaction Noted  ? Nifedipine er Other (See Comments) 04/03/2018  ? Atorvastatin Swelling 04/22/2019  ? Latex Rash 04/03/2018  ? ? ?Past Medical History:  ?Diagnosis Date  ? Allergy   ? Arthritis   ? Chronic headaches   ? Constipation   ? Depression   ? Diabetes mellitus   ? Hyperlipidemia   ? Hypertension   ? Obesity   ? PAD (peripheral artery disease) ([place])   ? Shortness of breath   ? Stage 3 chronic kidney disease ([place])   ? [facility]   ? ? ?Past Surgical History:  ?Procedure Laterality Date  ? ABDOMINAL AORTOGRAM W/LOWER EXTREMITY N/A 12/17/2018  ?  Procedure: ABDOMINAL AORTOGRAM W/LOWER EXTREMITY;  Surgeon: Waynetta Sandy, MD;  Location: [facility];  Service: Cardiovascular;  Laterality: N/A;  ? FEMORAL BYPASS  02/23/2011  ? Left Common Femoral to Below-knee popliteasl BPG   by Dr. Bridgett Larsson  ? PERIPHERAL VASCULAR ATHERECTOMY Left 12/17/2018  ? Procedure: PERIPHERAL VASCULAR ATHERECTOMY;  Surgeon: Waynetta Sandy, MD;  Location: [place] [facility];  Service: Cardiovascular;  Laterality: Left;  ? PERIPHERAL VASCULAR BALLOON ANGIOPLASTY Left 12/17/2018  ? Procedure: PERIPHERAL VASCULAR BALLOON ANGIOPLASTY;  Surgeon: Waynetta Sandy, MD;  Location: [facility];  Service:  Cardiovascular;  Laterality: Left;  ? ? ?Family History  ?Problem Relation Age of Onset  ? Diabetes Father   ? Heart disease Mother   ?     NOT before age 35-  Bypass  ? Hypertension Mother   ? Hypertension Sister   ? Varicose Veins Brother   ? Heart disease Brother   ?     Before age 73  ? Hypertension Daughter   ? Colon cancer Neg Hx   ? Esophageal cancer Neg Hx   ? Rectal cancer Neg Hx   ? Stomach cancer Neg Hx   ? ? ?Social History  ? ?Socioeconomic History  ? Marital status: Single  ?  Spouse name: Not on file  ? Number of children: Not on file  ? Years of education: Not on file  ? Highest education level: Not on file  ?Occupational History  ? Not on file  ?Tobacco Use  ? Smoking status: Light Smoker  ?  Packs/day: 0.50  ?  Years: 35.00  ?  Pack years: 17.50  ?  Types: Cigarettes  ? Smokeless tobacco: Never  ? Tobacco comments:  ?  patient is trying to quit and has gone down to half a pack per day  ?Vaping Use  ? Vaping Use: Never used  ?Substance and Sexual Activity  ? Alcohol use: Yes  ?  Alcohol/week: 1.0 standard drink  ?  Types: 1 Standard drinks or equivalent per week  ?  Comment: occasional use only  ? Drug use: No  ? Sexual activity: Not Currently  ?Other Topics Concern  ? Not on file  ?Social History Narrative  ? Not on file  ? ?Social Determinants of Health  ? ?Financial Resource Strain: Not on file  ?Food Insecurity: No Food Insecurity  ? Worried About Charity fundraiser in the Last Year: Never true  ? Ran Out of Food in the Last Year: Never true  ?Transportation Needs: Unmet Transportation Needs  ? Lack of Transportation (Medical): Yes  ? Lack of Transportation (Non-Medical): Yes  ?Physical Activity: Inactive  ? Days of Exercise per Week: 0 days  ? Minutes of Exercise per Session: 0 min  ?Stress: Not on file  ?Social Connections: Not on file  ?Intimate Partner Violence: Not on file  ? ? ?Review of Systems  ?Constitutional:  Positive for fatigue.  ?Psychiatric/Behavioral:  Positive for sleep  disturbance.   ? ?Vitals:  ? 03/08/22 0922  ?BP: 130/90  ?Pulse: 75  ?SpO2: 95%  ? ? ? ?Physical Exam ?Constitutional:   ?   Appearance: She is obese.  ?HENT:  ?   Head: Normocephalic.  ?   Mouth/Throat:  ?   Mouth: Mucous membranes are moist.  ?Eyes:  ?   Pupils: Pupils are equal, round, and reactive to light.  ?Cardiovascular:  ?   Rate and Rhythm: Normal rate and regular rhythm.  ?  Heart sounds: No murmur heard. ?  No friction rub.  ?Pulmonary:  ?   Effort: No respiratory distress.  ?   Breath sounds: No stridor. No wheezing or rhonchi.  ?Musculoskeletal:  ?   Cervical back: No rigidity or tenderness.  ?Neurological:  ?   Mental Status: She is alert.  ?Psychiatric:     ?   Mood and Affect: Mood normal.  ? ? ?  03/08/2022  ?  9:00 AM  ?Results of the Epworth flowsheet  ?Sitting and reading 0  ?Watching TV 0  ?Sitting, inactive in a public place (e.g. a theatre or a meeting) 0  ?As a passenger in a car for an hour without a break 2  ?Lying down to rest in the afternoon when circumstances permit 2  ?Sitting and talking to someone 0  ?Sitting quietly after a lunch without alcohol 0  ?In a car, while stopped for a few minutes in traffic 0  ?Total score 4  ? ? ?Data Reviewed: ?No previous sleep study available ? ?Echocardiogram 07/18/2021-normal ejection fraction, normal right systolic function ? ?Assessment:  ?Moderate quality of significant obstructive sleep apnea ? ?With history of heart failure with preserved ejection fraction, previous history of stroke ?-She will be best served by an in lab study ? ?Class III obesity ? ?Active smoker ? ?History of heart failure with preserved ejection fraction, peripheral vascular disease, hypertension ?-Treatment obstructive sleep apnea with reduced cardiovascular risk ? ?History of chronic kidney disease ? ? ?Pathophysiology of sleep disordered breathing discussed with the patient ?Treatment options discussed with the patient ? ?Plan/Recommendations: ?Importance of evaluating  and treating sleep disordered breathing discussed with the patient ? ?Smoking cessation counseling ? ?Schedule for in lab split-night study ? ?Encouraged weight loss efforts ? ?Tentative follow-up in about 4 months ? ?[place]

## 2022-03-08 NOTE — Progress Notes (Signed)
[facility] [place] ? ?ASSESSMENT / PLAN: ?71 y.o. female with left temporal headache; dizziness; fatigue. Her primary physician is concerned she may have temporal arteritis. Plan to proceed with left temporal artery biopsy 03/14/22.  ? ?CHIEF COMPLAINT: headache, dizziness, weakness ? ?HISTORY OF PRESENT ILLNESS: ?Alicia [name] is a 71 y.o. female with multiple medical problems referred to clinic for consideration of temporal artery biopsy.  The patient reports a left-sided temporal headache.  She has severe dizziness, which limits her ability to stand.  She describes the dizziness as vertiginous, and presyncope.  The dizziness seems to be constant.  Moving around seems to worsen the dizziness.  Nothing seems to make the dizziness better.  She also reports easy fatigability.  She does not have any jaw claudication.  She has no visual changes.  Blood work done by her primary physician showed an elevated ESR and CRP. ? ?Past Medical History:  ?Diagnosis Date  ? Allergy   ? Arthritis   ? Chronic headaches   ? Constipation   ? Depression   ? Diabetes mellitus   ? Hyperlipidemia   ? Hypertension   ? Obesity   ? PAD (peripheral artery disease) ([place])   ? Shortness of breath   ? Stage 3 chronic kidney disease ([place])   ? Stroke [facility])   ? ? ?Past Surgical History:  ?Procedure Laterality Date  ? ABDOMINAL AORTOGRAM W/LOWER EXTREMITY N/A 12/17/2018  ? Procedure: ABDOMINAL AORTOGRAM W/LOWER EXTREMITY;  Surgeon: Waynetta Sandy, MD;  Location: [place] [facility];  Service: Cardiovascular;  Laterality: N/A;  ? FEMORAL BYPASS  02/23/2011  ? Left Common Femoral to Below-knee popliteasl BPG   by Dr. Bridgett Larsson  ? PERIPHERAL VASCULAR ATHERECTOMY Left 12/17/2018  ? Procedure: PERIPHERAL VASCULAR ATHERECTOMY;  Surgeon: Waynetta Sandy, MD;  Location: [place] [facility];  Service: Cardiovascular;  Laterality: Left;  ? PERIPHERAL VASCULAR BALLOON ANGIOPLASTY Left 12/17/2018  ? Procedure: PERIPHERAL  VASCULAR BALLOON ANGIOPLASTY;  Surgeon: Waynetta Sandy, MD;  Location: [facility] [facility];  Service: Cardiovascular;  Laterality: Left;  ? ? ?Family History  ?Problem Relation Age of Onset  ? Diabetes Father   ? Heart disease Mother   ?     NOT before age 103-  Bypass  ? Hypertension Mother   ? Hypertension Sister   ? Varicose Veins Brother   ? Heart disease Brother   ?     Before age 22  ? Hypertension Daughter   ? Colon cancer Neg Hx   ? Esophageal cancer Neg Hx   ? Rectal cancer Neg Hx   ? Stomach cancer Neg Hx   ? ? ?Social History  ? ?Socioeconomic History  ? Marital status: Single  ?  Spouse name: Not on file  ? Number of children: Not on file  ? Years of education: Not on file  ? Highest education level: Not on file  ?Occupational History  ? Not on file  ?Tobacco Use  ? Smoking status: Light Smoker  ?  Packs/day: 0.50  ?  Years: 35.00  ?  Pack years: 17.50  ?  Types: Cigarettes  ? Smokeless tobacco: Never  ? Tobacco comments:  ?  patient is trying to quit and has gone down to half a pack per day  ?Vaping Use  ? Vaping Use: Never used  ?Substance and Sexual Activity  ? Alcohol use: Yes  ?  Alcohol/week: 1.0 standard drink  ?  Types: 1 Standard drinks or  equivalent per week  ?  Comment: occasional use only  ? Drug use: No  ? Sexual activity: Not Currently  ?Other Topics Concern  ? Not on file  ?Social History Narrative  ? Not on file  ? ?Social Determinants of Health  ? ?Financial Resource Strain: Not on file  ?Food Insecurity: No Food Insecurity  ? Worried About Charity fundraiser in the Last Year: Never true  ? Ran Out of Food in the Last Year: Never true  ?Transportation Needs: Unmet Transportation Needs  ? Lack of Transportation (Medical): Yes  ? Lack of Transportation (Non-Medical): Yes  ?Physical Activity: Inactive  ? Days of Exercise per Week: 0 days  ? Minutes of Exercise per Session: 0 min  ?Stress: Not on file  ?Social Connections: Not on file  ?Intimate Partner Violence: Not on file   ? ? ?Allergies  ?Allergen Reactions  ? Nifedipine Er Other (See Comments)  ?  Caused nose bleeds in higher doses (90 mg., namely)  ? Atorvastatin Swelling  ?  Lip swelling to lipitor ?Patient tolerates crestor  ? Latex Rash  ? ? ?Current Outpatient Medications  ?Medication Sig Dispense Refill  ? acetaminophen (TYLENOL) 500 MG tablet Take 2 tablets (1,000 mg total) by mouth every 8 (eight) hours as needed for moderate pain or headache. 30 tablet 0  ? albuterol (VENTOLIN HFA) 108 (90 Base) MCG/ACT inhaler Inhale 2 puffs into the lungs every 6 (six) hours as needed for wheezing or shortness of breath. 18 g 2  ? amLODipine (NORVASC) 10 MG tablet Take 1 tablet (10 mg total) by mouth daily. 30 tablet 11  ? aspirin 81 MG EC tablet Take 1 tablet (81 mg total) by mouth daily. 30 tablet 0  ? blood glucose meter kit and supplies per insurance preference. Check cbg once a day. Dx E11.9 1 each 11  ? diclofenac Sodium (VOLTAREN) 1 % GEL Apply 2 g topically 3 (three) times daily as needed (Pain). 200 g 0  ? furosemide (LASIX) 80 MG tablet Take 1 tablet (80 mg total) by mouth 2 (two) times daily. 60 tablet 11  ? nicotine (NICODERM CQ - DOSED IN MG/24 HOURS) 14 mg/24hr patch Place 1 patch (14 mg total) onto the skin daily. 42 patch 0  ? OXYGEN Inhale 2 L into the lungs as needed (oxygen).    ? predniSONE (DELTASONE) 20 MG tablet Take 2 tablets (40 mg total) by mouth daily with breakfast. 60 tablet 0  ? spironolactone (ALDACTONE) 25 MG tablet Take 1 tablet (25 mg total) by mouth daily. 30 tablet 11  ? metoprolol succinate (TOPROL-XL) 50 MG 24 hr tablet Take 1 tablet (50 mg total) by mouth daily. Take with or immediately following a meal. 30 tablet 3  ? rosuvastatin (CRESTOR) 10 MG tablet Take 1 tablet (10 mg total) by mouth daily. 30 tablet 0  ? sitaGLIPtin (JANUVIA) 25 MG tablet Take 1 tablet (25 mg total) by mouth daily. 90 tablet 0  ? ?No current facility-administered medications for this visit.  ? ? ?PHYSICAL EXAM ?Vitals:  ?  03/08/22 1255  ?BP: (!) 158/75  ?Pulse: 78  ?Resp: 20  ?Temp: 98.6 ?F (37 ?C)  ?SpO2: (!) 88%  ?Weight: 262 lb (118.8 kg)  ?Height: 5' 7" (1.702 m)  ? ? ?Constitutional: Obese.  Chronically ill-appearing. ?Cardiac: Regular rate and rhythm.  ?Respiratory:  unlabored. ?Abdominal:  soft, non-tender, non-distended.  ? ?PERTINENT LABORATORY AND RADIOLOGIC DATA ? ?Most recent CBC ? ?  Latest  Ref Rng & Units 02/16/2022  ? 10:00 AM 11/30/2021  ?  6:37 PM 07/19/2021  ?  2:44 AM  ?CBC  ?WBC 3.4 - 10.8 x10E3/uL 8.2   7.5   6.6    ?Hemoglobin 11.1 - 15.9 g/dL 11.2   11.5   12.3    ?Hematocrit 34.0 - 46.6 % 35.8   36.7   37.5    ?Platelets 150 - 450 x10E3/uL 314   286   252    ?  ? ?Most recent CMP ? ?  Latest Ref Rng & Units 02/23/2022  ? 11:50 AM 02/16/2022  ? 10:00 AM 12/10/2021  ? 12:25 PM  ?CMP  ?Glucose 70 - 99 mg/dL 104   136   126    ?BUN 8 - 27 mg/dL _0 ?Creatinine 0.57 - 1.00 mg/dL 2.34   2.35   2.14    ?Sodium 134 - 144 mmol/L 144   140   146    ?Potassium 3.5 - 5.2 mmol/L 3.8   3.7   4.4    ?Chloride 96 - 106 mmol/L 103   102   105    ?CO2 20 - 29 mmol/L _1 ?Calcium 8.7 - 10.3 mg/dL 8.1   8.3   8.7    ? ? ?Renal function ?Estimated Creatinine Clearance: 29.4 mL/min (A) (by C-G formula based on SCr of 2.34 mg/dL (H)). ? ?Hgb A1c MFr Bld (%)  ?Date Value  ?12/01/2021 6.4 (H)  ? ? ?LDL Chol Calc (NIH)  ?Date Value Ref Range Status  ?06/15/2020 114 (H) 0 - 99 mg/dL Final  ? ?LDL Cholesterol  ?Date Value Ref Range Status  ?12/03/2021 36 0 - 99 mg/dL Final  ?  Comment:  ?         ?Total Cholesterol/HDL:CHD Risk ?Coronary Heart Disease Risk Table ?                    Men   Women ? 1/2 Average Risk   3.4   3.3 ? Average Risk       5.0   4.4 ? 2 X Average Risk   9.6   7.1 ? 3 X Average Risk  23.4   11.0 ?       ?Use the calculated Patient Ratio ?above and the CHD Risk Table ?to determine the patient's CHD Risk. ?       ?ATP III CLASSIFICATION (LDL): ? <100     mg/dL   Optimal ? 100-129  mg/dL   Near or  Above ?                   Optimal ? 130-159  mg/dL   Borderline ? 160-189  mg/dL   High ? >190     mg/dL   Very High ?Performed at [facility], [place] [address], [place], [place] [address] ?  ?  ?Yevonne Aline.

## 2022-03-08 NOTE — Patient Instructions (Signed)
will schedule you for a split-night sleep study ? ?Continue working on quitting smoking ? ?Weight loss efforts as tolerated ? ?I will see you in about 4 months ? ?Living With Sleep Apnea ?Sleep apnea is a condition in which breathing pauses or becomes shallow during sleep. Sleep apnea is most commonly caused by a collapsed or blocked airway. People with sleep apnea usually snore loudly. They may have times when they gasp and stop breathing for 10 seconds or more during sleep. This may happen many times during the night. ?The breaks in breathing also interrupt the deep sleep that you need to feel rested. Even if you do not completely wake up from the gaps in breathing, your sleep may not be restful and you feel tired during the day. You may also have a headache in the morning and low energy during the day, and you may feel anxious or depressed. ?How can sleep apnea affect me? ?Sleep apnea increases your chances of extreme tiredness during the day (daytime fatigue). It can also increase your risk for health conditions, such as: ?Heart attack. ?Stroke. ?Obesity. ?Type 2 diabetes. ?Heart failure. ?Irregular heartbeat. ?High blood pressure. ?If you have daytime fatigue as a result of sleep apnea, you may be more likely to: ?Perform poorly at school or work. ?Fall asleep while driving. ?Have difficulty with attention. ?Develop depression or anxiety. ?Have sexual dysfunction. ?What actions can I take to manage sleep apnea? ?Sleep apnea treatment ? ?If you were given a device to open your airway while you sleep, use it only as told by your health care provider. You may be given: ?An oral appliance. This is a custom-made mouthpiece that shifts your lower jaw forward. ?A continuous positive airway pressure (CPAP) device. This device blows air through a mask when you breathe out (exhale). ?A nasal expiratory positive airway pressure (EPAP) device. This device has valves that you put into each nostril. ?A bi-level positive  airway pressure (BIPAP) device. This device blows air through a mask when you breathe in (inhale) and breathe out (exhale). ?You may need surgery if other treatments do not work for you. ?Sleep habits ?Go to sleep and wake up at the same time every day. This helps set your internal clock (circadian rhythm) for sleeping. ?If you stay up later than usual, such as on weekends, try to get up in the morning within 2 hours of your normal wake time. ?Try to get at least 7-9 hours of sleep each night. ?Stop using a computer, tablet, and mobile phone a few hours before bedtime. ?Do not take long naps during the day. If you nap, limit it to 30 minutes. ?Have a relaxing bedtime routine. Reading or listening to music may relax you and help you sleep. ?Use your bedroom only for sleep. ?Keep your television and computer out of your bedroom. ?Keep your bedroom cool, dark, and quiet. ?Use a supportive mattress and pillows. ?Follow your health care provider's instructions for other changes to sleep habits. ?Nutrition ?Do not eat heavy meals in the evening. ?Do not have caffeine in the later part of the day. The effects of caffeine can last for more than 5 hours. ?Follow your health care provider's or dietitian's instructions for any diet changes. ?Lifestyle ? ?  ? ?Do not drink alcohol before bedtime. Alcohol can cause you to fall asleep at first, but then it can cause you to wake up in the middle of the night and have trouble getting back to sleep. ?Do not use any  products that contain nicotine or tobacco. These products include cigarettes, chewing tobacco, and vaping devices, such as e-cigarettes. If you need help quitting, ask your health care provider. ?Medicines ?Take over-the-counter and prescription medicines only as told by your health care provider. ?Do not use over-the-counter sleep medicine. You can become dependent on this medicine, and it can make sleep apnea worse. ?Do not use medicines, such as sedatives and  narcotics, unless told by your health care provider. ?Activity ?Exercise on most days, but avoid exercising in the evening. Exercising near bedtime can interfere with sleeping. ?If possible, spend time outside every day. Natural light helps regulate your circadian rhythm. ?General information ?Lose weight if you need to, and maintain a healthy weight. ?Keep all follow-up visits. This is important. ?If you are having surgery, make sure to tell your health care provider that you have sleep apnea. You may need to bring your device with you. ?Where to find more information ?Learn more about sleep apnea and daytime fatigue from: ?American Sleep Association: sleepassociation.org ?National Sleep Foundation: sleepfoundation.org ?National Heart, Lung, and Blood Institute: https://www.hartman-hill.biz/ ?Summary ?Sleep apnea is a condition in which breathing pauses or becomes shallow during sleep. ?Sleep apnea can cause daytime fatigue and other serious health conditions. ?You may need to wear a device while sleeping to help keep your airway open. ?If you are having surgery, make sure to tell your health care provider that you have sleep apnea. You may need to bring your device with you. ?Making changes to sleep habits, diet, lifestyle, and activity can help you manage sleep apnea. ?This information is not intended to replace advice given to you by your health care provider. Make sure you discuss any questions you have with your health care provider. ?Document Revised: 06/16/2021 Document Reviewed: 10/16/2020 ?Elsevier Patient Education ? [place]. ? ?

## 2022-03-09 ENCOUNTER — Other Ambulatory Visit: Payer: Self-pay

## 2022-03-09 DIAGNOSIS — I10 Essential (primary) hypertension: Secondary | ICD-10-CM | POA: Diagnosis not present

## 2022-03-09 DIAGNOSIS — E1165 Type 2 diabetes mellitus with hyperglycemia: Secondary | ICD-10-CM | POA: Diagnosis not present

## 2022-03-09 DIAGNOSIS — R531 Weakness: Secondary | ICD-10-CM | POA: Diagnosis not present

## 2022-03-11 ENCOUNTER — Other Ambulatory Visit: Payer: Self-pay

## 2022-03-11 ENCOUNTER — Encounter (HOSPITAL_COMMUNITY): Payer: Self-pay | Admitting: Certified Registered"

## 2022-03-11 ENCOUNTER — Encounter (HOSPITAL_COMMUNITY): Payer: Self-pay | Admitting: Vascular Surgery

## 2022-03-11 NOTE — Progress Notes (Signed)
Spoke with pt for pre-op call. Pt has hx of HTN, PVD and Type 2 diabetes. Denies cardiac history. Pt states she has stopped her Januvia because "I'm just not feeling good and I stopped some of my medicines. She has also stopped her Amlodipine and Spirolactone. I asked her how her blood sugar was doing since stopping the Januvia and she states it's been about the same. She says it's usually around 140. Instructed her to check her blood sugar Monday AM when she wakes up. If blood sugar is 70 or below, treat with 1/2 cup of clear juice (apple or cranberry) and recheck blood sugar 15 minutes after drinking juice. Notify your nurse on arrival to Short Stay if you had to drink the juice. She voiced understanding. ? ?Pt was also by mistake taking both a 40 MG Lasix and a 80 MG Lasix. She states the pharmacy tech caught that. She states she is only taking the 80 MG tablet now.  ?

## 2022-03-13 NOTE — Anesthesia Preprocedure Evaluation (Deleted)
Anesthesia Evaluation  ? ? ?Airway ? ? ? ? ? ? ? Dental ?  ?Pulmonary ?neg pulmonary ROS, Current Smoker,  ?  ? ? ? ? ? ? ? Cardiovascular ?hypertension, Pt. on medications and Pt. on home beta blockers ?+ Peripheral Vascular Disease  ? ? ? ?  ?Neuro/Psych ? Headaches, Depression CVA   ? GI/Hepatic ?Neg liver ROS, GERD  ,  ?Endo/Other  ?diabetes, Oral Hypoglycemic Agents ? Renal/GU ?CRFRenal disease  ?negative genitourinary ?  ?Musculoskeletal ? ?(+) Arthritis , Osteoarthritis,   ? Abdominal ?  ?Peds ? Hematology ?negative hematology ROS ?(+)   ?Anesthesia Other Findings ? ? Reproductive/Obstetrics ? ?  ? ? ? ? ? ? ? ? ? ? ? ? ? ?  ?  ? ? ? ? ? ? ? ? ?Anesthesia Physical ?Anesthesia Plan ? ?ASA: 3 ? ?Anesthesia Plan: MAC  ? ?Post-op Pain Management:   ? ?Induction: Intravenous ? ?PONV Risk Score and Plan: 1 and Midazolam, Propofol infusion, Dexamethasone, Ondansetron and Treatment may vary due to age or medical condition ? ?Airway Management Planned: Simple Face Mask, Natural Airway and Nasal Cannula ? ?Additional Equipment: None ? ?Intra-op Plan:  ? ?Post-operative Plan:  ? ?Informed Consent:  ? ?Plan Discussed with:  ? ?Anesthesia Plan Comments: (Lab Results ?     Component                Value               Date                 ?     WBC                      8.2                 02/16/2022           ?     HGB                      11.2                02/16/2022           ?     HCT                      35.8                02/16/2022           ?     MCV                      90                  02/16/2022           ?     PLT                      314                 02/16/2022           ?Lab Results ?     Component                Value               Date                 ?  NA                       144                 02/23/2022           ?     K                        3.8                 02/23/2022           ?     CO2                      20                  02/23/2022           ?      GLUCOSE                  104 (H)             02/23/2022           ?     BUN                      24                  02/23/2022           ?     CREATININE               2.34 (H)            02/23/2022           ?     CALCIUM                  8.1 (L)             02/23/2022           ?     EGFR                     22 (L)              02/23/2022           ?     GFRNONAA                 20 (L)              12/03/2021          )  ? ? ? ? ? ? ?Anesthesia Quick Evaluation ? ?

## 2022-03-14 ENCOUNTER — Telehealth: Payer: Self-pay

## 2022-03-14 ENCOUNTER — Emergency Department (HOSPITAL_COMMUNITY): Payer: Medicare HMO

## 2022-03-14 ENCOUNTER — Inpatient Hospital Stay (HOSPITAL_COMMUNITY)
Admission: RE | Admit: 2022-03-14 | Discharge: 2022-03-24 | DRG: 252 | Disposition: A | Discharge status: Skilled Nursing Facility | Payer: Medicare HMO | Attending: Internal Medicine | Admitting: Internal Medicine

## 2022-03-14 ENCOUNTER — Encounter (HOSPITAL_COMMUNITY): Payer: Self-pay | Admitting: Vascular Surgery

## 2022-03-14 ENCOUNTER — Encounter (HOSPITAL_COMMUNITY)
Admission: RE | Disposition: A | Discharge status: Skilled Nursing Facility | Payer: Self-pay | Source: Home / Self Care | Attending: Student in an Organized Health Care Education/Training Program

## 2022-03-14 ENCOUNTER — Observation Stay (HOSPITAL_COMMUNITY): Payer: Medicare HMO

## 2022-03-14 ENCOUNTER — Other Ambulatory Visit: Payer: Self-pay

## 2022-03-14 DIAGNOSIS — I5030 Unspecified diastolic (congestive) heart failure: Secondary | ICD-10-CM | POA: Diagnosis not present

## 2022-03-14 DIAGNOSIS — N179 Acute kidney failure, unspecified: Secondary | ICD-10-CM | POA: Diagnosis not present

## 2022-03-14 DIAGNOSIS — R519 Headache, unspecified: Secondary | ICD-10-CM | POA: Diagnosis present

## 2022-03-14 DIAGNOSIS — E785 Hyperlipidemia, unspecified: Secondary | ICD-10-CM | POA: Diagnosis present

## 2022-03-14 DIAGNOSIS — Z7401 Bed confinement status: Secondary | ICD-10-CM | POA: Diagnosis not present

## 2022-03-14 DIAGNOSIS — Z9981 Dependence on supplemental oxygen: Secondary | ICD-10-CM | POA: Diagnosis not present

## 2022-03-14 DIAGNOSIS — E1165 Type 2 diabetes mellitus with hyperglycemia: Secondary | ICD-10-CM | POA: Diagnosis present

## 2022-03-14 DIAGNOSIS — E119 Type 2 diabetes mellitus without complications: Secondary | ICD-10-CM | POA: Diagnosis not present

## 2022-03-14 DIAGNOSIS — Z91040 Latex allergy status: Secondary | ICD-10-CM

## 2022-03-14 DIAGNOSIS — E11649 Type 2 diabetes mellitus with hypoglycemia without coma: Secondary | ICD-10-CM | POA: Diagnosis not present

## 2022-03-14 DIAGNOSIS — F32A Depression, unspecified: Secondary | ICD-10-CM | POA: Diagnosis present

## 2022-03-14 DIAGNOSIS — Z91128 Patient's intentional underdosing of medication regimen for other reason: Secondary | ICD-10-CM

## 2022-03-14 DIAGNOSIS — F439 Reaction to severe stress, unspecified: Secondary | ICD-10-CM | POA: Diagnosis not present

## 2022-03-14 DIAGNOSIS — Y92002 Bathroom of unspecified non-institutional (private) residence single-family (private) house as the place of occurrence of the external cause: Secondary | ICD-10-CM | POA: Diagnosis not present

## 2022-03-14 DIAGNOSIS — N184 Chronic kidney disease, stage 4 (severe): Secondary | ICD-10-CM | POA: Diagnosis present

## 2022-03-14 DIAGNOSIS — N189 Chronic kidney disease, unspecified: Secondary | ICD-10-CM | POA: Diagnosis not present

## 2022-03-14 DIAGNOSIS — M1990 Unspecified osteoarthritis, unspecified site: Secondary | ICD-10-CM | POA: Diagnosis present

## 2022-03-14 DIAGNOSIS — E269 Hyperaldosteronism, unspecified: Secondary | ICD-10-CM | POA: Diagnosis not present

## 2022-03-14 DIAGNOSIS — T383X6A Underdosing of insulin and oral hypoglycemic [antidiabetic] drugs, initial encounter: Secondary | ICD-10-CM | POA: Diagnosis present

## 2022-03-14 DIAGNOSIS — I517 Cardiomegaly: Secondary | ICD-10-CM | POA: Diagnosis not present

## 2022-03-14 DIAGNOSIS — Z7982 Long term (current) use of aspirin: Secondary | ICD-10-CM | POA: Diagnosis not present

## 2022-03-14 DIAGNOSIS — I509 Heart failure, unspecified: Secondary | ICD-10-CM

## 2022-03-14 DIAGNOSIS — J9611 Chronic respiratory failure with hypoxia: Secondary | ICD-10-CM | POA: Diagnosis present

## 2022-03-14 DIAGNOSIS — Z8673 Personal history of transient ischemic attack (TIA), and cerebral infarction without residual deficits: Secondary | ICD-10-CM

## 2022-03-14 DIAGNOSIS — R739 Hyperglycemia, unspecified: Secondary | ICD-10-CM | POA: Diagnosis not present

## 2022-03-14 DIAGNOSIS — M316 Other giant cell arteritis: Secondary | ICD-10-CM | POA: Diagnosis not present

## 2022-03-14 DIAGNOSIS — E669 Obesity, unspecified: Secondary | ICD-10-CM

## 2022-03-14 DIAGNOSIS — Z7984 Long term (current) use of oral hypoglycemic drugs: Secondary | ICD-10-CM | POA: Diagnosis not present

## 2022-03-14 DIAGNOSIS — E8809 Other disorders of plasma-protein metabolism, not elsewhere classified: Secondary | ICD-10-CM | POA: Diagnosis present

## 2022-03-14 DIAGNOSIS — W19XXXA Unspecified fall, initial encounter: Secondary | ICD-10-CM | POA: Diagnosis present

## 2022-03-14 DIAGNOSIS — E1151 Type 2 diabetes mellitus with diabetic peripheral angiopathy without gangrene: Secondary | ICD-10-CM | POA: Diagnosis present

## 2022-03-14 DIAGNOSIS — Z888 Allergy status to other drugs, medicaments and biological substances status: Secondary | ICD-10-CM

## 2022-03-14 DIAGNOSIS — R531 Weakness: Secondary | ICD-10-CM | POA: Diagnosis not present

## 2022-03-14 DIAGNOSIS — R42 Dizziness and giddiness: Secondary | ICD-10-CM | POA: Diagnosis not present

## 2022-03-14 DIAGNOSIS — I10 Essential (primary) hypertension: Secondary | ICD-10-CM | POA: Diagnosis present

## 2022-03-14 DIAGNOSIS — Z833 Family history of diabetes mellitus: Secondary | ICD-10-CM

## 2022-03-14 DIAGNOSIS — R262 Difficulty in walking, not elsewhere classified: Secondary | ICD-10-CM | POA: Diagnosis not present

## 2022-03-14 DIAGNOSIS — M2550 Pain in unspecified joint: Secondary | ICD-10-CM | POA: Diagnosis not present

## 2022-03-14 DIAGNOSIS — T447X6A Underdosing of beta-adrenoreceptor antagonists, initial encounter: Secondary | ICD-10-CM | POA: Diagnosis present

## 2022-03-14 DIAGNOSIS — Z79899 Other long term (current) drug therapy: Secondary | ICD-10-CM | POA: Diagnosis not present

## 2022-03-14 DIAGNOSIS — I872 Venous insufficiency (chronic) (peripheral): Secondary | ICD-10-CM | POA: Diagnosis present

## 2022-03-14 DIAGNOSIS — I5033 Acute on chronic diastolic (congestive) heart failure: Secondary | ICD-10-CM | POA: Diagnosis present

## 2022-03-14 DIAGNOSIS — W182XXA Fall in (into) shower or empty bathtub, initial encounter: Secondary | ICD-10-CM | POA: Diagnosis present

## 2022-03-14 DIAGNOSIS — F17210 Nicotine dependence, cigarettes, uncomplicated: Secondary | ICD-10-CM | POA: Diagnosis present

## 2022-03-14 DIAGNOSIS — B379 Candidiasis, unspecified: Secondary | ICD-10-CM | POA: Diagnosis not present

## 2022-03-14 DIAGNOSIS — I5032 Chronic diastolic (congestive) heart failure: Secondary | ICD-10-CM | POA: Diagnosis not present

## 2022-03-14 DIAGNOSIS — G441 Vascular headache, not elsewhere classified: Secondary | ICD-10-CM | POA: Diagnosis not present

## 2022-03-14 DIAGNOSIS — E1122 Type 2 diabetes mellitus with diabetic chronic kidney disease: Secondary | ICD-10-CM | POA: Diagnosis present

## 2022-03-14 DIAGNOSIS — R5383 Other fatigue: Secondary | ICD-10-CM | POA: Diagnosis present

## 2022-03-14 DIAGNOSIS — M6281 Muscle weakness (generalized): Secondary | ICD-10-CM | POA: Diagnosis not present

## 2022-03-14 DIAGNOSIS — I493 Ventricular premature depolarization: Secondary | ICD-10-CM | POA: Diagnosis not present

## 2022-03-14 DIAGNOSIS — K219 Gastro-esophageal reflux disease without esophagitis: Secondary | ICD-10-CM | POA: Diagnosis present

## 2022-03-14 DIAGNOSIS — Z6841 Body Mass Index (BMI) 40.0 and over, adult: Secondary | ICD-10-CM

## 2022-03-14 DIAGNOSIS — I130 Hypertensive heart and chronic kidney disease with heart failure and stage 1 through stage 4 chronic kidney disease, or unspecified chronic kidney disease: Secondary | ICD-10-CM | POA: Diagnosis present

## 2022-03-14 DIAGNOSIS — I110 Hypertensive heart disease with heart failure: Secondary | ICD-10-CM | POA: Diagnosis not present

## 2022-03-14 DIAGNOSIS — Z8249 Family history of ischemic heart disease and other diseases of the circulatory system: Secondary | ICD-10-CM

## 2022-03-14 DIAGNOSIS — G4733 Obstructive sleep apnea (adult) (pediatric): Secondary | ICD-10-CM | POA: Diagnosis present

## 2022-03-14 DIAGNOSIS — E876 Hypokalemia: Secondary | ICD-10-CM | POA: Diagnosis present

## 2022-03-14 DIAGNOSIS — E1169 Type 2 diabetes mellitus with other specified complication: Secondary | ICD-10-CM | POA: Diagnosis present

## 2022-03-14 DIAGNOSIS — J449 Chronic obstructive pulmonary disease, unspecified: Secondary | ICD-10-CM | POA: Diagnosis not present

## 2022-03-14 DIAGNOSIS — R9082 White matter disease, unspecified: Secondary | ICD-10-CM | POA: Diagnosis not present

## 2022-03-14 HISTORY — DX: Gastro-esophageal reflux disease without esophagitis: K21.9

## 2022-03-14 LAB — CBC WITH DIFFERENTIAL/PLATELET
Abs Immature Granulocytes: 0.08 10*3/uL — ABNORMAL HIGH (ref 0.00–0.07)
Basophils Absolute: 0 10*3/uL (ref 0.0–0.1)
Basophils Relative: 0 %
Eosinophils Absolute: 0 10*3/uL (ref 0.0–0.5)
Eosinophils Relative: 0 %
HCT: 36.5 % (ref 36.0–46.0)
Hemoglobin: 11.7 g/dL — ABNORMAL LOW (ref 12.0–15.0)
Immature Granulocytes: 1 %
Lymphocytes Relative: 10 %
Lymphs Abs: 1 10*3/uL (ref 0.7–4.0)
MCH: 29.1 pg (ref 26.0–34.0)
MCHC: 32.1 g/dL (ref 30.0–36.0)
MCV: 90.8 fL (ref 80.0–100.0)
Monocytes Absolute: 0.7 10*3/uL (ref 0.1–1.0)
Monocytes Relative: 7 %
Neutro Abs: 7.9 10*3/uL — ABNORMAL HIGH (ref 1.7–7.7)
Neutrophils Relative %: 82 %
Platelets: 224 10*3/uL (ref 150–400)
RBC: 4.02 MIL/uL (ref 3.87–5.11)
RDW: 15.1 % (ref 11.5–15.5)
WBC: 9.8 10*3/uL (ref 4.0–10.5)
nRBC: 0 % (ref 0.0–0.2)

## 2022-03-14 LAB — COMPREHENSIVE METABOLIC PANEL
ALT: 93 U/L — ABNORMAL HIGH (ref 0–44)
AST: 70 U/L — ABNORMAL HIGH (ref 15–41)
Albumin: 2.9 g/dL — ABNORMAL LOW (ref 3.5–5.0)
Alkaline Phosphatase: 122 U/L (ref 38–126)
Anion gap: 12 (ref 5–15)
BUN: 39 mg/dL — ABNORMAL HIGH (ref 8–23)
CO2: 28 mmol/L (ref 22–32)
Calcium: 7.5 mg/dL — ABNORMAL LOW (ref 8.9–10.3)
Chloride: 98 mmol/L (ref 98–111)
Creatinine, Ser: 2.71 mg/dL — ABNORMAL HIGH (ref 0.44–1.00)
GFR, Estimated: 18 mL/min — ABNORMAL LOW (ref >60)
Glucose, Bld: 339 mg/dL — ABNORMAL HIGH (ref 70–99)
Potassium: 3.1 mmol/L — ABNORMAL LOW (ref 3.5–5.1)
Sodium: 138 mmol/L (ref 135–145)
Total Bilirubin: 0.6 mg/dL (ref 0.3–1.2)
Total Protein: 6 g/dL — ABNORMAL LOW (ref 6.5–8.1)

## 2022-03-14 LAB — POCT I-STAT, CHEM 8
BUN: 37 mg/dL — ABNORMAL HIGH (ref 8–23)
Calcium, Ion: 0.95 mmol/L — ABNORMAL LOW (ref 1.15–1.40)
Chloride: 94 mmol/L — ABNORMAL LOW (ref 98–111)
Creatinine, Ser: 2.9 mg/dL — ABNORMAL HIGH (ref 0.44–1.00)
Glucose, Bld: 475 mg/dL — ABNORMAL HIGH (ref 70–99)
HCT: 35 % — ABNORMAL LOW (ref 36.0–46.0)
Hemoglobin: 11.9 g/dL — ABNORMAL LOW (ref 12.0–15.0)
Potassium: 3.3 mmol/L — ABNORMAL LOW (ref 3.5–5.1)
Sodium: 136 mmol/L (ref 135–145)
TCO2: 29 mmol/L (ref 22–32)

## 2022-03-14 LAB — CBG MONITORING, ED
Glucose-Capillary: 315 mg/dL — ABNORMAL HIGH (ref 70–99)
Glucose-Capillary: 193 mg/dL — ABNORMAL HIGH (ref 70–99)

## 2022-03-14 LAB — BRAIN NATRIURETIC PEPTIDE: B Natriuretic Peptide: 2093.8 pg/mL — ABNORMAL HIGH (ref 0.0–100.0)

## 2022-03-14 LAB — CK: Total CK: 254 U/L — ABNORMAL HIGH (ref 38–234)

## 2022-03-14 LAB — GLUCOSE, CAPILLARY: Glucose-Capillary: 489 mg/dL — ABNORMAL HIGH (ref 70–99)

## 2022-03-14 LAB — MAGNESIUM: Magnesium: 1.8 mg/dL (ref 1.7–2.4)

## 2022-03-14 SURGERY — BIOPSY TEMPORAL ARTERY
Anesthesia: Monitor Anesthesia Care | Laterality: Left

## 2022-03-14 MED ORDER — SPIRONOLACTONE 25 MG PO TABS
25.0000 mg | ORAL_TABLET | Freq: Every day | ORAL | Status: DC
  Administered 2022-03-14 – 2022-03-15 (×2): 25 mg via ORAL
  Filled 2022-03-14 (×2): qty 1

## 2022-03-14 MED ORDER — INSULIN ASPART 100 UNIT/ML IJ SOLN: 0.0000 [IU] | INTRAMUSCULAR | Status: DC | PRN

## 2022-03-14 MED ORDER — AMLODIPINE BESYLATE 5 MG PO TABS: 10.0000 mg | ORAL_TABLET | Freq: Every day | ORAL | Status: DC

## 2022-03-14 MED ORDER — HYDRALAZINE HCL 20 MG/ML IJ SOLN
INTRAMUSCULAR | Status: AC
  Administered 2022-03-14: 20 mg
  Filled 2022-03-14: qty 1

## 2022-03-14 MED ORDER — INSULIN ASPART 100 UNIT/ML IJ SOLN
0.0000 [IU] | Freq: Three times a day (TID) | INTRAMUSCULAR | Status: DC
  Administered 2022-03-15 (×2): 3 [IU] via SUBCUTANEOUS

## 2022-03-14 MED ORDER — ROSUVASTATIN CALCIUM 5 MG PO TABS
10.0000 mg | ORAL_TABLET | Freq: Every evening | ORAL | Status: DC
  Administered 2022-03-15 – 2022-03-23 (×9): 10 mg via ORAL
  Filled 2022-03-14 (×9): qty 2

## 2022-03-14 MED ORDER — POTASSIUM CHLORIDE CRYS ER 20 MEQ PO TBCR
40.0000 meq | EXTENDED_RELEASE_TABLET | Freq: Once | ORAL | Status: AC
  Administered 2022-03-14: 40 meq via ORAL
  Filled 2022-03-14: qty 2

## 2022-03-14 MED ORDER — MIDAZOLAM HCL 2 MG/2ML IJ SOLN
INTRAMUSCULAR | Status: AC
  Filled 2022-03-14: qty 2

## 2022-03-14 MED ORDER — CHLORHEXIDINE GLUCONATE 4 % EX LIQD
60.0000 mL | Freq: Once | CUTANEOUS | Status: DC
Start: 2022-03-14 — End: 2022-03-14

## 2022-03-14 MED ORDER — NICOTINE 14 MG/24HR TD PT24
14.0000 mg | MEDICATED_PATCH | Freq: Every day | TRANSDERMAL | Status: DC
  Administered 2022-03-15 – 2022-03-23 (×9): 14 mg via TRANSDERMAL
  Filled 2022-03-14 (×9): qty 1

## 2022-03-14 MED ORDER — METOPROLOL SUCCINATE ER 50 MG PO TB24
50.0000 mg | ORAL_TABLET | Freq: Every evening | ORAL | Status: DC
Start: 2022-03-14 — End: 2022-03-22
  Administered 2022-03-14 – 2022-03-21 (×8): 50 mg via ORAL
  Filled 2022-03-14 (×7): qty 1
  Filled 2022-03-14: qty 2

## 2022-03-14 MED ORDER — ALBUTEROL SULFATE (2.5 MG/3ML) 0.083% IN NEBU: 3.0000 mL | INHALATION_SOLUTION | Freq: Four times a day (QID) | RESPIRATORY_TRACT | Status: DC | PRN

## 2022-03-14 MED ORDER — CEFAZOLIN SODIUM-DEXTROSE 2-4 GM/100ML-% IV SOLN: 2.0000 g | INTRAVENOUS | Status: DC

## 2022-03-14 MED ORDER — FENTANYL CITRATE (PF) 250 MCG/5ML IJ SOLN
INTRAMUSCULAR | Status: AC
  Filled 2022-03-14: qty 5

## 2022-03-14 MED ORDER — PREDNISONE 20 MG PO TABS
40.0000 mg | ORAL_TABLET | Freq: Every day | ORAL | Status: DC
  Administered 2022-03-15 – 2022-03-18 (×4): 40 mg via ORAL
  Filled 2022-03-14 (×4): qty 2

## 2022-03-14 MED ORDER — MIDAZOLAM HCL (PF) 10 MG/2ML IJ SOLN
INTRAMUSCULAR | Status: AC
  Filled 2022-03-14: qty 2

## 2022-03-14 MED ORDER — METOPROLOL TARTRATE 25 MG PO TABS
50.0000 mg | ORAL_TABLET | Freq: Once | ORAL | Status: AC
  Administered 2022-03-14: 50 mg via ORAL
  Filled 2022-03-14: qty 2

## 2022-03-14 MED ORDER — FUROSEMIDE 10 MG/ML IJ SOLN
80.0000 mg | Freq: Two times a day (BID) | INTRAMUSCULAR | Status: DC
Start: 2022-03-14 — End: 2022-03-16
  Administered 2022-03-14 – 2022-03-16 (×4): 80 mg via INTRAVENOUS
  Filled 2022-03-14 (×4): qty 8

## 2022-03-14 MED ORDER — HYDRALAZINE HCL 20 MG/ML IJ SOLN: 10.0000 mg | Freq: Once | INTRAMUSCULAR | Status: AC

## 2022-03-14 MED ORDER — METOPROLOL SUCCINATE ER 25 MG PO TB24
ORAL_TABLET | ORAL | Status: AC
  Filled 2022-03-14: qty 2

## 2022-03-14 MED ORDER — HYDRALAZINE HCL 20 MG/ML IJ SOLN
20.0000 mg | Freq: Once | INTRAMUSCULAR | Status: AC
  Administered 2022-03-14: 20 mg via INTRAVENOUS
  Filled 2022-03-14: qty 1

## 2022-03-14 MED ORDER — SODIUM CHLORIDE 0.9 % IV SOLN: INTRAVENOUS | Status: DC

## 2022-03-14 MED ORDER — PROPOFOL 10 MG/ML IV BOLUS
INTRAVENOUS | Status: AC
  Filled 2022-03-14: qty 20

## 2022-03-14 MED ORDER — LABETALOL HCL 5 MG/ML IV SOLN
20.0000 mg | Freq: Once | INTRAVENOUS | Status: AC
  Administered 2022-03-14: 20 mg via INTRAVENOUS
  Filled 2022-03-14: qty 4

## 2022-03-14 MED ORDER — CHLORHEXIDINE GLUCONATE 4 % EX LIQD: 60.0000 mL | Freq: Once | CUTANEOUS | Status: DC

## 2022-03-14 MED ORDER — CHLORHEXIDINE GLUCONATE 0.12 % MT SOLN: 15.0000 mL | Freq: Once | OROMUCOSAL | Status: AC

## 2022-03-14 MED ORDER — ACETAMINOPHEN 500 MG PO TABS
1000.0000 mg | ORAL_TABLET | Freq: Four times a day (QID) | ORAL | Status: DC | PRN
  Administered 2022-03-14 – 2022-03-21 (×8): 1000 mg via ORAL
  Filled 2022-03-14 (×9): qty 2

## 2022-03-14 MED ORDER — AMLODIPINE BESYLATE 10 MG PO TABS
10.0000 mg | ORAL_TABLET | Freq: Every day | ORAL | Status: DC
  Administered 2022-03-15 – 2022-03-23 (×9): 10 mg via ORAL
  Filled 2022-03-14 (×7): qty 1
  Filled 2022-03-14: qty 2
  Filled 2022-03-14: qty 1

## 2022-03-14 MED ORDER — CLONIDINE HCL 0.2 MG PO TABS
0.2000 mg | ORAL_TABLET | Freq: Once | ORAL | Status: AC
  Administered 2022-03-14: 0.2 mg via ORAL
  Filled 2022-03-14: qty 1

## 2022-03-14 MED ORDER — HYDRALAZINE HCL 25 MG PO TABS
25.0000 mg | ORAL_TABLET | Freq: Once | ORAL | Status: AC
  Administered 2022-03-14: 25 mg via ORAL
  Filled 2022-03-14: qty 1

## 2022-03-14 MED ORDER — CEFAZOLIN SODIUM-DEXTROSE 2-4 GM/100ML-% IV SOLN
INTRAVENOUS | Status: AC
  Filled 2022-03-14: qty 100

## 2022-03-14 MED ORDER — ORAL CARE MOUTH RINSE: 15.0000 mL | Freq: Once | OROMUCOSAL | Status: AC

## 2022-03-14 MED ORDER — INSULIN ASPART 100 UNIT/ML IJ SOLN
INTRAMUSCULAR | Status: AC
  Administered 2022-03-14: 10 [IU] via SUBCUTANEOUS
  Filled 2022-03-14: qty 1

## 2022-03-14 MED ORDER — CHLORHEXIDINE GLUCONATE 0.12 % MT SOLN
OROMUCOSAL | Status: AC
  Administered 2022-03-14: 15 mL via OROMUCOSAL
  Filled 2022-03-14: qty 15

## 2022-03-14 MED ORDER — ENOXAPARIN SODIUM 30 MG/0.3ML IJ SOSY
30.0000 mg | PREFILLED_SYRINGE | INTRAMUSCULAR | Status: DC
  Administered 2022-03-14 – 2022-03-22 (×9): 30 mg via SUBCUTANEOUS
  Filled 2022-03-14 (×9): qty 0.3

## 2022-03-14 NOTE — ED Notes (Signed)
MD Katsadouros notified about the patient's BP. MD stated that due to her dx, giving her any more BP medications will not be needed. MD states to continue to monitor, no further orders at this time.  ?

## 2022-03-14 NOTE — Hospital Course (Addendum)
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 Generalized weakness, can't get out of bed. Bp hard to manage, got IV hydral but this wasn't working ? ? ?

## 2022-03-14 NOTE — Telephone Encounter (Signed)
Pt neighbor called to report that pt did not get her biopsy  done today because   pt p and sugars were very high .. she stated that pt is in ED room  at moment  ?

## 2022-03-14 NOTE — ED Provider Notes (Signed)
?[facility] ?Provider Note ? ? ?CSN: [id number] ?Arrival date & time: 03/14/22  4128 ? ?  ? ?History ? ?Chief Complaint  ?Patient presents with  ? Hypertension  ? Hyperglycemia  ? ? ?Alicia [name] is a 71 y.o. female. ? ?She wasPatient presents chief complaint of generalized weakness, persistent hypertension and hyperglycemia.  She said she has not been compliant with medications at home because they do not "make her feel well."  Noted to have temporal artery biopsy done this morning.  However upon arrival she explained to staff that she had fallen yesterday while in the bathtub.  She states that her legs gave out and she just kind of let herself down to the ground denies any hard traumatic fall.  Denies any pain but was unable to get herself upright.  She crawled to her neighbor who called EMS and she was helped up.  Afterwards her neighbor brought her to the hospital today.  Patient denies any headache or chest pain or abdominal pain.  No fever no cough no vomiting or diarrhea reported.  She is on 2 L nasal cannula at baseline. ? ? ?  ? ?Home Medications ?Prior to Admission medications   ?Medication Sig Start Date End Date Taking? Authorizing Provider  ?acetaminophen (TYLENOL) 650 MG CR tablet Take 1,300 mg by mouth every 8 (eight) hours as needed for pain.   Yes [provider]  ?albuterol (VENTOLIN HFA) 108 (90 Base) MCG/ACT inhaler Inhale 2 puffs into the lungs every 6 (six) hours as needed for wheezing or shortness of breath. 12/10/21  Yes Rick Duff, MD  ?aspirin 81 MG EC tablet Take 1 tablet (81 mg total) by mouth daily. ?Patient taking differently: Take 81 mg by mouth every evening. 12/03/21  Yes [name], MD  ?furosemide (LASIX) 40 MG tablet Take 40 mg by mouth 2 (two) times daily.   Yes [provider]  ?furosemide (LASIX) 80 MG tablet Take 1 tablet (80 mg total) by mouth 2 (two) times daily. 02/23/22 02/23/23 Yes Gaylan Gerold, DO   ?metoprolol succinate (TOPROL-XL) 50 MG 24 hr tablet Take 1 tablet (50 mg total) by mouth daily. Take with or immediately following a meal. ?Patient taking differently: Take 50 mg by mouth every evening. Take with or immediately following a meal. 01/07/22 03/14/22 Yes Atway, Rayann N, DO  ?nicotine (NICODERM CQ - DOSED IN MG/24 HOURS) 14 mg/24hr patch Place 1 patch (14 mg total) onto the skin daily. 02/23/22 04/06/22 Yes Gaylan Gerold, DO  ?OXYGEN Inhale 2 L into the lungs as needed (oxygen).   Yes [provider]  ?predniSONE (DELTASONE) 20 MG tablet Take 2 tablets (40 mg total) by mouth daily with breakfast. 02/21/22 03/23/22 Yes Aslam, Sadia, MD  ?rosuvastatin (CRESTOR) 10 MG tablet Take 1 tablet (10 mg total) by mouth daily. ?Patient taking differently: Take 10 mg by mouth every evening. 12/03/21 03/14/22 Yes [name], MD  ?sitaGLIPtin (JANUVIA) 25 MG tablet Take 1 tablet (25 mg total) by mouth daily. 08/22/21 03/14/22 Yes [name], MD  ?spironolactone (ALDACTONE) 25 MG tablet Take 1 tablet (25 mg total) by mouth daily. 02/16/22 02/16/23 Yes Katsadouros, Vasilios, MD  ?amLODipine (NORVASC) 10 MG tablet Take 1 tablet (10 mg total) by mouth daily. ?Patient not taking: Reported on 03/09/2022 02/16/22 02/16/23  Riesa Pope, MD  ?blood glucose meter kit and supplies per insurance preference. Check cbg once a day. Dx E11.9 01/06/20   Jacelyn Pi, Lilia Argue, MD  ?diclofenac  Sodium (VOLTAREN) 1 % GEL Apply 2 g topically 3 (three) times daily as needed (Pain). 12/03/21   [name], MD  ?   ? ?Allergies    ?Nifedipine er, Atorvastatin, and Latex   ? ?Review of Systems   ?Review of Systems  ?Constitutional:  Negative for fever.  ?HENT:  Negative for ear pain.   ?Eyes:  Negative for pain.  ?Respiratory:  Negative for cough.   ?Cardiovascular:  Negative for chest pain.  ?Gastrointestinal:  Negative for abdominal pain.  ?Genitourinary:  Negative for flank pain.  ?Musculoskeletal:  Negative for back pain.  ?Skin:   Negative for rash.  ?Neurological:  Negative for headaches.  ? ?Physical Exam ?Updated Vital Signs ?BP (!) 189/64   Pulse 61   Temp 98.3 ?F (36.8 ?C) (Oral)   Resp 19   Ht 5' 7"  (1.702 m)   Wt 117.9 kg   SpO2 97%   BMI 40.72 kg/m?  ?Physical Exam ?Constitutional:   ?   General: She is not in acute distress. ?   Appearance: Normal appearance.  ?HENT:  ?   Head: Normocephalic.  ?   Nose: Nose normal.  ?Eyes:  ?   Extraocular Movements: Extraocular movements intact.  ?Cardiovascular:  ?   Rate and Rhythm: Normal rate.  ?Pulmonary:  ?   Effort: Pulmonary effort is normal.  ?Musculoskeletal:     ?   General: Normal range of motion.  ?   Cervical back: Normal range of motion.  ?Neurological:  ?   General: No focal deficit present.  ?   Mental Status: She is alert and oriented to person, place, and time. Mental status is at baseline.  ? ? ?ED Results / Procedures / Treatments   ?Labs ?(all labs ordered are listed, but only abnormal results are displayed) ?Labs Reviewed  ?GLUCOSE, CAPILLARY - Abnormal; Notable for the following components:  ?    Result Value  ? Glucose-Capillary 489 (*)   ? All other components within normal limits  ?CBC WITH DIFFERENTIAL/PLATELET - Abnormal; Notable for the following components:  ? Hemoglobin 11.7 (*)   ? Neutro Abs 7.9 (*)   ? Abs Immature Granulocytes 0.08 (*)   ? All other components within normal limits  ?COMPREHENSIVE METABOLIC PANEL - Abnormal; Notable for the following components:  ? Potassium 3.1 (*)   ? Glucose, Bld 339 (*)   ? BUN 39 (*)   ? Creatinine, Ser 2.71 (*)   ? Calcium 7.5 (*)   ? Total Protein 6.0 (*)   ? Albumin 2.9 (*)   ? AST 70 (*)   ? ALT 93 (*)   ? GFR, Estimated 18 (*)   ? All other components within normal limits  ?CK - Abnormal; Notable for the following components:  ? Total CK 254 (*)   ? All other components within normal limits  ?POCT I-STAT, CHEM 8 - Abnormal; Notable for the following components:  ? Potassium 3.3 (*)   ? Chloride 94 (*)   ? BUN 37  (*)   ? Creatinine, Ser 2.90 (*)   ? Glucose, Bld 475 (*)   ? Calcium, Ion 0.95 (*)   ? Hemoglobin 11.9 (*)   ? HCT 35.0 (*)   ? All other components within normal limits  ?CBG MONITORING, ED - Abnormal; Notable for the following components:  ? Glucose-Capillary 315 (*)   ? All other components within normal limits  ? ? ?EKG ?None ? ?Radiology ?CT Head Wo Contrast ? ?  Result Date: 03/14/2022 ?CLINICAL DATA:  Fall EXAM: CT HEAD WITHOUT CONTRAST TECHNIQUE: Contiguous axial images were obtained from the base of the skull through the vertex without intravenous contrast. RADIATION DOSE REDUCTION: This exam was performed according to the departmental dose-optimization program which includes automated exposure control, adjustment of the mA and/or kV according to patient size and/or use of iterative reconstruction technique. COMPARISON:  07/17/2021 FINDINGS: Brain: No evidence of acute infarction, hemorrhage, hydrocephalus, extra-axial collection or mass lesion/mass effect. Periventricular and deep white matter hypodensity. Vascular: No hyperdense vessel or unexpected calcification. Skull: Normal. Negative for fracture or focal lesion. Sinuses/Orbits: No acute finding. Other: None. IMPRESSION: No acute intracranial pathology. Small-vessel white matter disease. Electronically Signed   By: Delanna Ahmadi M.D.   On: 03/14/2022 12:17   ? ?Procedures ?Marland KitchenCritical Care ?Performed by: Luna Fuse, MD ?Authorized by: Luna Fuse, MD  ? ?Critical care provider statement:  ?  Critical care time (minutes):  40 ?  Critical care time was exclusive of:  Separately billable procedures and treating other patients and teaching time ?  Critical care was necessary to treat or prevent imminent or life-threatening deterioration of the following conditions:  Cardiac failure and circulatory failure  ? ? ?Medications Ordered in ED ?Medications  ?chlorhexidine (PERIDEX) 0.12 % solution 15 mL (15 mLs Mouth/Throat Given 03/14/22 0298)  ?  Or   ?MEDLINE mouth rinse ( Mouth Rinse See Alternative 03/14/22 4730)  ?hydrALAZINE (APRESOLINE) injection 10 mg (20 mg Intravenous Given 03/14/22 0709)  ?potassium chloride SA (KLOR-CON M) CR tablet 40 mEq (40 mEq Oral Given 4/24/

## 2022-03-14 NOTE — H&P (Addendum)
?Date: 03/14/2022     ?     ?     ?[name] MRN: [id number]  ?DOB: January 06, 1951 Age / Sex: 71 y.o., female   ?PCP: Rick Duff, MD    ?     ?Medical Service: Internal Medicine Teaching Service    ?     ?Attending Physician: Dr. Evette Doffing, Mallie Mussel, *    ?First Contact: Delene Ruffini, MD Pager: [phone]  ?Second Contact: Sanjuana Letters, DO Pager: [phone] [phone]  ?     ?After Hours (After 5p/  First Contact Pager: [phone]  ?weekends / holidays): Second Contact Pager: [phone]  ? ?SUBJECTIVE  ? ?Chief Complaint: Fall ? ?History of Present Illness:  ? ? ?Patient is a 71 year old with a history of difficult to control hypertension who was sent to the emergency department for elevated blood pressure and blood sugar after presenting for temporal artery biopsy. ? ?Yesterday she slid out of the shower. Did not lose consciousness or hit her head. Has periodic chest pain that she describes as gassy. It doesn't happen daily.  Does endorse episode of chest pain prior to falling. Does endorse chronic issues with balance as well. When she went to the floor, she was unable to get up. She inched her way to the bedroom, she couldn't lift herself up onto the bed. She feels weak and unable to get up. On the ground for 4 hours. She has had diffuse weakness for some time and believes it is due to the medicines. She only takes medicines once a day at most.  Did not take any medicines last night. She does complain of left leg weakness, but states that this has been present for months.  Also notes that both of her legs have been swollen for some time now. Lightheadedness and dizziness, not getting better. She is also getting short of breath easily.  She is worried to use her cane and has to use her walker. She feels as though the weakness has generally worsened. She is unable to go out and do things like grocery shop anymore.  States it takes her up to 2 hours to get dressed in the morning.  She must  stop due to weakness when ambulating very short distances. ? ? ?Meds:  ?Current Meds  ?Medication Sig  ? acetaminophen (TYLENOL) 650 MG CR tablet Take 1,300 mg by mouth every 8 (eight) hours as needed for pain.  ? albuterol (VENTOLIN HFA) 108 (90 Base) MCG/ACT inhaler Inhale 2 puffs into the lungs every 6 (six) hours as needed for wheezing or shortness of breath.  ? aspirin 81 MG EC tablet Take 1 tablet (81 mg total) by mouth daily. (Patient taking differently: Take 81 mg by mouth every evening.)  ? furosemide (LASIX) 40 MG tablet Take 40 mg by mouth 2 (two) times daily.  ? furosemide (LASIX) 80 MG tablet Take 1 tablet (80 mg total) by mouth 2 (two) times daily.  ? metoprolol succinate (TOPROL-XL) 50 MG 24 hr tablet Take 1 tablet (50 mg total) by mouth daily. Take with or immediately following a meal. (Patient taking differently: Take 50 mg by mouth every evening. Take with or immediately following a meal.)  ? nicotine (NICODERM CQ - DOSED IN MG/24 HOURS) 14 mg/24hr patch Place 1 patch (14 mg total) onto the skin daily.  ? OXYGEN Inhale 2 L into the lungs as needed (oxygen).  ? predniSONE (DELTASONE) 20 MG tablet Take 2 tablets (40 mg total) by  mouth daily with breakfast.  ? rosuvastatin (CRESTOR) 10 MG tablet Take 1 tablet (10 mg total) by mouth daily. (Patient taking differently: Take 10 mg by mouth every evening.)  ? sitaGLIPtin (JANUVIA) 25 MG tablet Take 1 tablet (25 mg total) by mouth daily.  ? spironolactone (ALDACTONE) 25 MG tablet Take 1 tablet (25 mg total) by mouth daily.  ? ? ?Past Medical History:  ?Diagnosis Date  ? Allergy   ? Arthritis   ? Chronic headaches   ? Constipation   ? Depression   ? Diabetes mellitus   ? GERD (gastroesophageal reflux disease)   ? Hyperlipidemia   ? Hypertension   ? Obesity   ? PAD (peripheral artery disease) ([place])   ? Shortness of breath   ? Stage 3 chronic kidney disease ([place])   ? Stroke [facility])   ? ? ?Past Surgical History:  ?Procedure Laterality Date  ? ABDOMINAL AORTOGRAM  W/LOWER EXTREMITY N/A 12/17/2018  ? Procedure: ABDOMINAL AORTOGRAM W/LOWER EXTREMITY;  Surgeon: Waynetta Sandy, MD;  Location: [facility];  Service: Cardiovascular;  Laterality: N/A;  ? FEMORAL BYPASS  02/23/2011  ? Left Common Femoral to Below-knee popliteasl BPG   by Dr. Bridgett Larsson  ? PERIPHERAL VASCULAR ATHERECTOMY Left 12/17/2018  ? Procedure: PERIPHERAL VASCULAR ATHERECTOMY;  Surgeon: Waynetta Sandy, MD;  Location: [facility];  Service: Cardiovascular;  Laterality: Left;  ? PERIPHERAL VASCULAR BALLOON ANGIOPLASTY Left 12/17/2018  ? Procedure: PERIPHERAL VASCULAR BALLOON ANGIOPLASTY;  Surgeon: Waynetta Sandy, MD;  Location: [facility];  Service: Cardiovascular;  Laterality: Left;  ? ? ?Social:  ?Lives: Alone ?Occupation: none ?Support: Has no family or friends to help support her. ?Level of Function: independent, able to dress self and pay bills. trouble with medication management ?PCP: Rick Duff, MD ?Substances: 1/2 PPD tobacco user 50 years, occasional alcohol, denies other substances ? ?Family History:  ?Uncertain of family history, but states some family members have had strokes ? ?Allergies: ?Allergies as of 03/09/2022 - Review Complete 03/09/2022  ?Allergen Reaction Noted  ? Nifedipine er Other (See Comments) 04/03/2018  ? Atorvastatin Swelling 04/22/2019  ? Latex Rash 04/03/2018  ? ? ?Review of Systems: ?A complete ROS was negative except as per HPI.  ? ?OBJECTIVE:  ? ?Physical Exam: ?Blood pressure (!) 194/91, pulse 61, temperature 98.3 ?F (36.8 ?C), temperature source Oral, resp. rate 19, height '5\' 7"'$  (1.702 m), weight 117.9 kg, SpO2 97 %.  ?Constitutional: Chronically ill-appearing female lying in bed, in no acute distress ?HENT: normocephalic atraumatic, mucous membranes moist ?Eyes: conjunctiva non-erythematous ?Cardiovascular: regular rate and rhythm, no m/r/g, 2+ bilateral pitting edema to thigh, unable to assess for JVD ?Pulmonary/Chest:  normal work of breathing on room air, lungs clear to auscultation bilaterally ?Abdominal: soft, non-tender, non-distended ?MSK: normal bulk and tone ?Neurological: alert & oriented x 3, 5/5 strength in bilateral upper extremities, 3-4/5 bilateral lower extremities ?Skin: warm and dry ?Psych: Mood and affect appropriate ? ?Labs: ?CBC ?   ?Component Value Date/Time  ? WBC 9.8 03/14/2022 0837  ? RBC 4.02 03/14/2022 0837  ? HGB 11.7 (L) 03/14/2022 0837  ? HGB 11.2 02/16/2022 1000  ? HCT 36.5 03/14/2022 0837  ? HCT 35.8 02/16/2022 1000  ? PLT 224 03/14/2022 0837  ? PLT 314 02/16/2022 1000  ? MCV 90.8 03/14/2022 0837  ? MCV 90 02/16/2022 1000  ? MCH 29.1 03/14/2022 0837  ? MCHC 32.1 03/14/2022 0837  ? RDW 15.1 03/14/2022 0837  ? RDW 15.4 02/16/2022  1000  ? LYMPHSABS 1.0 03/14/2022 0837  ? LYMPHSABS 0.8 02/16/2022 1000  ? MONOABS 0.7 03/14/2022 0837  ? EOSABS 0.0 03/14/2022 0837  ? EOSABS 0.1 02/16/2022 1000  ? BASOSABS 0.0 03/14/2022 0837  ? BASOSABS 0.0 02/16/2022 1000  ?  ? ?CMP  ?   ?Component Value Date/Time  ? NA 138 03/14/2022 0837  ? NA 144 02/23/2022 1150  ? K 3.1 (L) 03/14/2022 0837  ? CL 98 03/14/2022 0837  ? CO2 28 03/14/2022 0837  ? GLUCOSE 339 (H) 03/14/2022 0837  ? BUN 39 (H) 03/14/2022 0837  ? BUN 24 02/23/2022 1150  ? CREATININE 2.71 (H) 03/14/2022 6415  ? CALCIUM 7.5 (L) 03/14/2022 0837  ? PROT 6.0 (L) 03/14/2022 8309  ? PROT 7.4 06/15/2020 1214  ? ALBUMIN 2.9 (L) 03/14/2022 0837  ? ALBUMIN 3.7 (L) 06/15/2020 1214  ? AST 70 (H) 03/14/2022 0837  ? ALT 93 (H) 03/14/2022 0837  ? ALKPHOS 122 03/14/2022 0837  ? BILITOT 0.6 03/14/2022 0837  ? BILITOT 0.4 06/15/2020 1214  ? GFRNONAA 18 (L) 03/14/2022 4076  ? GFRAA 35 (L) 06/15/2020 1214  ? ? ?Imaging: ?CT Head Wo Contrast ? ?Result Date: 03/14/2022 ?CLINICAL DATA:  Fall EXAM: CT HEAD WITHOUT CONTRAST TECHNIQUE: Contiguous axial images were obtained from the base of the skull through the vertex without intravenous contrast. RADIATION DOSE REDUCTION: This exam was  performed according to the departmental dose-optimization program which includes automated exposure control, adjustment of the mA and/or kV according to patient size and/or use of iterative reconstruction

## 2022-03-14 NOTE — Progress Notes (Signed)
Gave bedside report to Marta Antu, RN in emergency room. All questions answered and all belongings sent with patient.  ?

## 2022-03-14 NOTE — Progress Notes (Addendum)
Patient states she fell at home and was on the floor from 6pm-11pm. She was able to crawl to the phone and call her neighbor around 9pm in which EMS was then called. Also tells me that she hasnt taken Januvia in a "long time" because she doesn't like the way it makes her feel. Did not take her metoprolol last night due to being on the floor. Patient does not have a person to stay with her at home for the first 24 hours either.  Dr. Stanford Breed will be cancelling surgery and anesthesia team made aware. Patient will be taken to the emergency room for further treatment. ?

## 2022-03-15 ENCOUNTER — Observation Stay (HOSPITAL_BASED_OUTPATIENT_CLINIC_OR_DEPARTMENT_OTHER): Payer: Medicare HMO

## 2022-03-15 DIAGNOSIS — I5030 Unspecified diastolic (congestive) heart failure: Secondary | ICD-10-CM | POA: Diagnosis not present

## 2022-03-15 DIAGNOSIS — I10 Essential (primary) hypertension: Secondary | ICD-10-CM | POA: Diagnosis not present

## 2022-03-15 DIAGNOSIS — W19XXXA Unspecified fall, initial encounter: Secondary | ICD-10-CM

## 2022-03-15 DIAGNOSIS — M316 Other giant cell arteritis: Secondary | ICD-10-CM | POA: Diagnosis present

## 2022-03-15 LAB — CBC
HCT: 40.1 % (ref 36.0–46.0)
Hemoglobin: 12.9 g/dL (ref 12.0–15.0)
MCH: 29.7 pg (ref 26.0–34.0)
MCHC: 32.2 g/dL (ref 30.0–36.0)
MCV: 92.4 fL (ref 80.0–100.0)
Platelets: 235 10*3/uL (ref 150–400)
RBC: 4.34 MIL/uL (ref 3.87–5.11)
RDW: 15.4 % (ref 11.5–15.5)
WBC: 6.7 10*3/uL (ref 4.0–10.5)
nRBC: 0 % (ref 0.0–0.2)

## 2022-03-15 LAB — ECHOCARDIOGRAM COMPLETE
AR max vel: 2.02 cm2
AV Area VTI: 1.97 cm2
AV Area mean vel: 1.92 cm2
AV Mean grad: 5 mmHg
AV Peak grad: 10.2 mmHg
Ao pk vel: 1.6 m/s
Area-P 1/2: 3.85 cm2
Calc EF: 42.8 %
Height: 67 in
MV VTI: 1.65 cm2
S' Lateral: 2.8 cm
Single Plane A2C EF: 36.4 %
Single Plane A4C EF: 45.9 %
Weight: 4119.96 oz

## 2022-03-15 LAB — BASIC METABOLIC PANEL
Anion gap: 9 (ref 5–15)
BUN: 39 mg/dL — ABNORMAL HIGH (ref 8–23)
CO2: 31 mmol/L (ref 22–32)
Calcium: 7.7 mg/dL — ABNORMAL LOW (ref 8.9–10.3)
Chloride: 100 mmol/L (ref 98–111)
Creatinine, Ser: 2.83 mg/dL — ABNORMAL HIGH (ref 0.44–1.00)
GFR, Estimated: 17 mL/min — ABNORMAL LOW (ref >60)
Glucose, Bld: 205 mg/dL — ABNORMAL HIGH (ref 70–99)
Potassium: 3.9 mmol/L (ref 3.5–5.1)
Sodium: 140 mmol/L (ref 135–145)

## 2022-03-15 LAB — CBG MONITORING, ED
Glucose-Capillary: 186 mg/dL — ABNORMAL HIGH (ref 70–99)
Glucose-Capillary: 204 mg/dL — ABNORMAL HIGH (ref 70–99)
Glucose-Capillary: 234 mg/dL — ABNORMAL HIGH (ref 70–99)
Glucose-Capillary: 261 mg/dL — ABNORMAL HIGH (ref 70–99)

## 2022-03-15 LAB — GLUCOSE, CAPILLARY: Glucose-Capillary: 213 mg/dL — ABNORMAL HIGH (ref 70–99)

## 2022-03-15 MED ORDER — INSULIN ASPART 100 UNIT/ML IJ SOLN
0.0000 [IU] | INTRAMUSCULAR | Status: DC
  Administered 2022-03-15: 7 [IU] via SUBCUTANEOUS
  Administered 2022-03-15: 11 [IU] via SUBCUTANEOUS
  Administered 2022-03-16: 4 [IU] via SUBCUTANEOUS
  Administered 2022-03-16: 3 [IU] via SUBCUTANEOUS
  Administered 2022-03-16: 2 [IU] via SUBCUTANEOUS
  Administered 2022-03-16: 20 [IU] via SUBCUTANEOUS
  Administered 2022-03-17: 7 [IU] via SUBCUTANEOUS

## 2022-03-15 MED ORDER — SPIRONOLACTONE 25 MG PO TABS
50.0000 mg | ORAL_TABLET | Freq: Every day | ORAL | Status: DC
  Administered 2022-03-16 – 2022-03-19 (×4): 50 mg via ORAL
  Filled 2022-03-15 (×4): qty 2

## 2022-03-15 MED ORDER — SPIRONOLACTONE 25 MG PO TABS
25.0000 mg | ORAL_TABLET | Freq: Once | ORAL | Status: AC
Start: 2022-03-15 — End: 2022-03-15
  Administered 2022-03-15: 25 mg via ORAL
  Filled 2022-03-15: qty 1

## 2022-03-15 NOTE — ED Notes (Signed)
Breakfast order placed ?

## 2022-03-15 NOTE — Evaluation (Signed)
Physical Therapy Evaluation ?Patient Details ?Name: Alicia [name] ?MRN: [id number] ?DOB: Feb 03, 1951 ?Today's Date: 03/15/2022 ? ?History of Present Illness ? Pt is a 71 y.o. female who presented on 4/24 for temporal artery biopsy but surgery cancelled due to fall, generalized weakness, hyperglycemia, and persistent hypertension. PMH: chronic diastolic heart failure, hyperlipidemia, hypertension, peripheral vascular disease, chronic respiratory failure on 2L supplemental oxygen, previous CVA, type II diabetes mellitus, class III obesity, chronic renal insufficiency.  ?Clinical Impression ? Pt was seen for mobility from recliner after nursing assisted her to the chair.  Pt is initially reluctant to try to stand over concern that she will not be able, but with one person help could get upright.  Pt is weaker on LLE than RLE which has been a longer standing issue.  Talked with her about the weight of the rollator she is using and may be appropriate for a lighter walker to be ordered.  Will recommend following pt for acute PT goals, and to transfer care to SNF for strengthening and balance training.  Her walker needs may change based on her recovery,  which already is improved this AM while she continues to practice moving.  Follow along to increase standing balance and endurance, to increase LE strength and to work on transition to home level independence after her rehab stay.   ?   ? ?Recommendations for follow up therapy are one component of a multi-disciplinary discharge planning process, led by the attending physician.  Recommendations may be updated based on patient status, additional functional criteria and insurance authorization. ? ?Follow Up Recommendations Skilled nursing-short term rehab (<3 hours/day) ? ?  ?Assistance Recommended at Discharge Intermittent Supervision/Assistance  ?Patient can return home with the following ? A lot of help with walking and/or transfers;A little help with  bathing/dressing/bathroom;Assistance with cooking/housework;Assist for transportation;Help with stairs or ramp for entrance ? ?  ?Equipment Recommendations None recommended by PT  ?Recommendations for Other Services ?    ?  ?Functional Status Assessment Patient has had a recent decline in their functional status and demonstrates the ability to make significant improvements in function in a reasonable and predictable amount of time.  ? ?  ?Precautions / Restrictions Precautions ?Precautions: Fall ?Precaution Comments: monitor BP ?Restrictions ?Weight Bearing Restrictions: No  ? ?  ? ?Mobility ? Bed Mobility ?  ?  ?  ?  ?  ?  ?  ?General bed mobility comments: in recliner when PT arrived ?  ? ?Transfers ?Overall transfer level: Needs assistance ?Equipment used: 1 person hand held assist ?Transfers: Sit to/from Stand ?Sit to Stand: Mod assist ?  ?  ?  ?  ?  ?General transfer comment: mod assist from recliner mult attempts with help to lean forward from chair posture ?  ? ?Ambulation/Gait ?  ?  ?  ?  ?  ?  ?  ?General Gait Details: deferred due to safety ? ?Stairs ?  ?  ?  ?  ?  ? ?Wheelchair Mobility ?  ? ?Modified Rankin (Stroke Patients Only) ?  ? ?  ? ?Balance Overall balance assessment: Needs assistance ?Sitting-balance support: Feet supported ?Sitting balance-Leahy Scale: Fair ?  ?  ?Standing balance support: Bilateral upper extremity supported, During functional activity ?Standing balance-Leahy Scale: Poor ?  ?  ?  ?  ?  ?  ?  ?  ?  ?  ?  ?  ?   ? ? ? ?Pertinent Vitals/Pain Pain Assessment ?Pain Assessment: 0-10 ?Pain Score: 5  ?  Pain Location: low back while leaning back in chair ?Pain Descriptors / Indicators: Guarding, Grimacing ?Pain Intervention(s): Repositioned  ? ? ?Home Living Family/patient expects to be discharged to:: Private residence ?Living Arrangements: Alone ?Available Help at Discharge:  (none per pt) ?Type of Home: Apartment ?Home Access: Stairs to enter ?Entrance Stairs-Rails:  Right;Left ?Entrance Stairs-Number of Steps: 2 ?  ?Home Layout: One level ?Home Equipment: Shower seat;BSC/3in1;Rollator (4 wheels);Rolling Walker (2 wheels);Cane - quad;Cane - single point ?Additional Comments: uses 2L O2 at home  ?  ?Prior Function Prior Level of Function : Independent/Modified Independent;History of Falls (last six months);Driving ?  ?  ?  ?  ?  ?  ?Mobility Comments: reports using rollator for short trips at home, unable to take it out with her due to wgt of device but states she drives herself ?ADLs Comments: reports independent ADLs, limited IADLs with increased time required for all tasks ?  ? ? ?Hand Dominance  ? Dominant Hand: Right ? ?  ?Extremity/Trunk Assessment  ? Upper Extremity Assessment ?Upper Extremity Assessment: Defer to OT evaluation ?  ? ?Lower Extremity Assessment ?Lower Extremity Assessment: Generalized weakness (L weaker than RLE) ?  ? ?Cervical / Trunk Assessment ?Cervical / Trunk Assessment: Kyphotic (mild change)  ?Communication  ? Communication: No difficulties  ?Cognition Arousal/Alertness: Awake/alert ?Behavior During Therapy: [facility] for tasks assessed/performed ?Overall Cognitive Status: Within Functional Limits for tasks assessed ?  ?  ?  ?  ?  ?  ?  ?  ?  ?  ?  ?  ?  ?  ?  ?  ?  ?  ?  ? ?  ?General Comments General comments (skin integrity, edema, etc.): BP monitored, increased from 196/96 to 207/86 with limited activity- RN aware ? ?  ?Exercises    ? ?Assessment/Plan  ?  ?PT Assessment Patient needs continued PT services  ?PT Problem List Decreased strength;Decreased activity tolerance;Decreased balance;Decreased mobility;Cardiopulmonary status limiting activity;Pain ? ?   ?  ?PT Treatment Interventions DME instruction;Gait training;Stair training;Functional mobility training;Therapeutic activities;Therapeutic exercise;Balance training;Neuromuscular re-education;Patient/family education   ? ?PT Goals (Current goals can be found in the Care Plan section)  ?Acute Rehab  PT Goals ?Patient Stated Goal: to regain her independence ?PT Goal Formulation: With patient ?Time For Goal Achievement: 03/29/22 ?Potential to Achieve Goals: Good ? ?  ?Frequency Min 3X/week ?  ? ? ?Co-evaluation   ?  ?  ?  ?  ? ? ?  ?AM-PAC PT "6 Clicks" Mobility  ?Outcome Measure Help needed turning from your back to your side while in a flat bed without using bedrails?: A Little ?Help needed moving from lying on your back to sitting on the side of a flat bed without using bedrails?: A Lot ?Help needed moving to and from a bed to a chair (including a wheelchair)?: A Lot ?Help needed standing up from a chair using your arms (e.g., wheelchair or bedside chair)?: A Lot ?Help needed to walk in hospital room?: Total ?Help needed climbing 3-5 steps with a railing? : Total ?6 Click Score: 11 ? ?  ?End of Session Equipment Utilized During Treatment: Gait belt;Oxygen ?Activity Tolerance: Patient limited by fatigue;Treatment limited secondary to medical complications (Comment) ?Patient left: in chair;with call bell/phone within reach ?Nurse Communication: Mobility status ?PT Visit Diagnosis: Muscle weakness (generalized) (M62.81);Pain;Difficulty in walking, not elsewhere classified (R26.2);Unsteadiness on feet (R26.81);Other abnormalities of gait and mobility (R26.89);History of falling (Z91.81) ?Pain - Right/Left:  (back) ?Pain - part of body:  (back) ?  ? ?  Time: 2060-1561 ?PT Time Calculation (min) (ACUTE ONLY): 31 min ? ? ?Charges:   PT Evaluation ?$PT Eval Moderate Complexity: 1 Mod ?PT Treatments ?$Therapeutic Activity: 8-22 mins ?  ?   ? ?Ramond Dial ?03/15/2022, 1:50 PM ? ?Mee Hives, PT PhD ?Acute Rehab Dept. Number: [facility] [phone] and [place] [phone] ? ?

## 2022-03-15 NOTE — Evaluation (Signed)
Occupational Therapy Evaluation ?Patient Details ?Name: Alicia [name] ?MRN: [id number] ?DOB: 1951/11/05 ?Today's Date: 03/15/2022 ? ? ?History of Present Illness Pt is a 71 y.o. female who presented on 4/24 for temporal artery biopsy but surgery cancelled due to fall, generalized weakness, hyperglycemia, and persistent hypertension. PMH: chronic diastolic heart failure, hyperlipidemia, hypertension, peripheral vascular disease, chronic respiratory failure on 2L supplemental oxygen, previous CVA, type II diabetes mellitus, class III obesity, chronic renal insufficiency.  ? ?Clinical Impression ?  ?PTA patient reports using RW for mobility and independent for ADLs but increased difficulty recently, lives alone and reports not having anyone to assist her.  She was admitted for above and presents with generalized weakness, impaired balance, decreased activity tolerance.  She requires +2 max assist to stand from recliner using RW, setup to min assist for UB Adls and max to total assist for LB ADLs.  BP increased further with limited activity in room, RN aware.  Based on performance today, believe pt will benefit from further OT services while admitted and after dc at SNF level to optimize independence, safety and return to PLOF.  Will follow.  ?   ? ?Recommendations for follow up therapy are one component of a multi-disciplinary discharge planning process, led by the attending physician.  Recommendations may be updated based on patient status, additional functional criteria and insurance authorization.  ? ?Follow Up Recommendations ? Skilled nursing-short term rehab (<3 hours/day)  ?  ?Assistance Recommended at Discharge Frequent or constant Supervision/Assistance  ?Patient can return home with the following Two people to help with walking and/or transfers;Two people to help with bathing/dressing/bathroom;Assistance with cooking/housework;Assist for transportation;Help with stairs or ramp for entrance ? ?   ?Functional Status Assessment ? Patient has had a recent decline in their functional status and demonstrates the ability to make significant improvements in function in a reasonable and predictable amount of time.  ?Equipment Recommendations ? Other (comment) (defer)  ?  ?Recommendations for Other Services PT consult ? ? ?  ?Precautions / Restrictions Precautions ?Precautions: Fall ?Precaution Comments: watch BP ?Restrictions ?Weight Bearing Restrictions: No  ? ?  ? ?Mobility Bed Mobility ?  ?  ?  ?  ?  ?  ?  ?General bed mobility comments: OOB in recliner upon entry ?  ? ?Transfers ?Overall transfer level: Needs assistance ?Equipment used: Rolling walker (2 wheels) ?Transfers: Sit to/from Stand ?Sit to Stand: Max assist, +2 physical assistance, +2 safety/equipment ?  ?  ?  ?  ?  ?General transfer comment: max +2 to power up and steady from recliner with cueing for hand placement and safety ?  ? ?  ?Balance Overall balance assessment: Needs assistance ?Sitting-balance support: No upper extremity supported, Feet supported ?Sitting balance-Leahy Scale: Fair ?  ?  ?Standing balance support: Bilateral upper extremity supported, During functional activity, Reliant on assistive device for balance ?Standing balance-Leahy Scale: Poor ?  ?  ?  ?  ?  ?  ?  ?  ?  ?  ?  ?  ?   ? ?ADL either performed or assessed with clinical judgement  ? ?ADL Overall ADL's : Needs assistance/impaired ?  ?  ?Grooming: Set up;Sitting ?  ?  ?  ?  ?  ?Upper Body Dressing : Minimal assistance;Sitting ?  ?Lower Body Dressing: Total assistance;+2 for physical assistance;+2 for safety/equipment;Sit to/from stand ?  ?  ?Toilet Transfer Details (indicate cue type and reason): unable ?  ?  ?  ?  ?Functional mobility during ADLs:  Maximal assistance;+2 for physical assistance;+2 for safety/equipment ?   ? ? ? ?Vision   ?Vision Assessment?: No apparent visual deficits  ?   ?Perception   ?  ?Praxis   ?  ? ?Pertinent Vitals/Pain Pain Assessment ?Pain  Assessment: Faces ?Faces Pain Scale: Hurts little more ?Pain Location: lower back ?Pain Descriptors / Indicators: Discomfort, Grimacing ?Pain Intervention(s): Limited activity within patient's tolerance, Monitored during session, Repositioned  ? ? ? ?Hand Dominance Right ?  ?Extremity/Trunk Assessment Upper Extremity Assessment ?Upper Extremity Assessment: Generalized weakness ?  ?Lower Extremity Assessment ?Lower Extremity Assessment: Defer to PT evaluation ?  ?  ?  ?Communication Communication ?Communication: No difficulties ?  ?Cognition Arousal/Alertness: Awake/alert ?Behavior During Therapy: [facility] for tasks assessed/performed ?Overall Cognitive Status: Within Functional Limits for tasks assessed ?  ?  ?  ?  ?  ?  ?  ?  ?  ?  ?  ?  ?  ?  ?  ?  ?  ?  ?  ?General Comments  BP monitored, increased from 196/96 to 207/86 with limited activity- RN aware ? ?  ?Exercises   ?  ?Shoulder Instructions    ? ? ?Home Living Family/patient expects to be discharged to:: Private residence ?Living Arrangements: Alone ?Available Help at Discharge:  (none) ?Type of Home: Apartment ?Home Access: Stairs to enter ?Entrance Stairs-Number of Steps: 2 ?Entrance Stairs-Rails: Right;Left ?Home Layout: One level ?  ?  ?Bathroom Shower/Tub: Tub/shower unit ?  ?Bathroom Toilet: Standard ?  ?  ?Home Equipment: Shower seat;BSC/3in1;Rollator (4 wheels);Rolling Walker (2 wheels);Cane - quad;Cane - single point ?  ?Additional Comments: uses 2L O2 at home ?  ? ?  ?Prior Functioning/Environment Prior Level of Function : Independent/Modified Independent;History of Falls (last six months);Driving ?  ?  ?  ?  ?  ?  ?Mobility Comments: using RW for mobility due to increased unsteadiness ?ADLs Comments: reports independent ADLs, limited IADLs with increased time required for all tasks ?  ? ?  ?  ?OT Problem List: Decreased strength;Decreased activity tolerance;Impaired balance (sitting and/or standing);Decreased safety awareness;Decreased knowledge of use  of DME or AE;Decreased knowledge of precautions;Obesity ?  ?   ?OT Treatment/Interventions: Self-care/ADL training;Therapeutic exercise;DME and/or AE instruction;Therapeutic activities;Patient/family education;Balance training  ?  ?OT Goals(Current goals can be found in the care plan section) Acute Rehab OT Goals ?Patient Stated Goal: get better ?OT Goal Formulation: With patient ?Time For Goal Achievement: 03/29/22 ?Potential to Achieve Goals: Good  ?OT Frequency: Min 2X/week ?  ? ?Co-evaluation   ?  ?  ?  ?  ? ?  ?AM-PAC OT "6 Clicks" Daily Activity     ?Outcome Measure Help from another person eating meals?: A Little ?Help from another person taking care of personal grooming?: A Little ?Help from another person toileting, which includes using toliet, bedpan, or urinal?: Total ?Help from another person bathing (including washing, rinsing, drying)?: A Lot ?Help from another person to put on and taking off regular upper body clothing?: A Little ?Help from another person to put on and taking off regular lower body clothing?: Total ?6 Click Score: 13 ?  ?End of Session Equipment Utilized During Treatment: Rolling walker (2 wheels);Gait belt ?Nurse Communication: Mobility status;Other (comment) (BP) ? ?Activity Tolerance: Patient tolerated treatment well ?Patient left: in chair;with call bell/phone within reach;with nursing/sitter in room ? ?OT Visit Diagnosis: Other abnormalities of gait and mobility (R26.89);Muscle weakness (generalized) (M62.81);History of falling (Z91.81)  ?              ?  Time: 1008-1030 ?OT Time Calculation (min): 22 min ?Charges:  OT General Charges ?$OT Visit: 1 Visit ?OT Evaluation ?$OT Eval Moderate Complexity: 1 Mod ? ?Jolaine Artist, OT ?Acute Rehabilitation Services ?Pager [phone] ?Office [phone] ? ? ?Delight Stare ?03/15/2022, 12:10 PM ?

## 2022-03-15 NOTE — ED Notes (Signed)
Pt received breakfast tray 

## 2022-03-15 NOTE — Progress Notes (Signed)
? ? ?HD#0 ?SUBJECTIVE:  ?Patient Summary: Alicia [name] is a 71 y.o. with a pertinent PMH of hypertension, CHF, who presented with weakness and fall and admitted for hypertension, hyperglycemia.  ? ?Overnight Events: Admitted overnight.  ? ?  ?Interm History: Patient seen and evaluated.  She is sitting up in chair at bedside.  States that breathing feels about the same weakness feels about the same as yesterday.  She is proud of herself for being able to get up and sit in chair. ? ?OBJECTIVE:  ?Vital Signs: ?Vitals:  ? 03/15/22 1000 03/15/22 1040 03/15/22 1100 03/15/22 1400  ?BP: (!) 180/92 (!) 191/92 (!) 177/95 (!) 164/98  ?Pulse: 61 66 (!) 59 77  ?Resp: '14 16 17 16  '$ ?Temp:      ?TempSrc:      ?SpO2: 100% 99% 99% 94%  ?Weight:      ?Height:      ? ?Supplemental O2:  ?SpO2: 94 % ?O2 Flow Rate (L/min): 2 L/min ? ?Filed Weights  ? 03/14/22 0740 03/15/22 0865 03/15/22 0823  ?Weight: 117.9 kg 116.8 kg 116.8 kg  ? ? ? ?Intake/Output Summary (Last 24 hours) at 03/15/2022 1505 ?Last data filed at 03/15/2022 1019 ?Gross per 24 hour  ?Intake 480 ml  ?Output 600 ml  ?Net -120 ml  ? ?Net IO Since Admission: -120 mL [03/15/22 1505] ? ?Physical Exam: ?Constitutional: Chronically ill-appearing female lying in bed, in no acute distress ?Cardiovascular: regular rate and rhythm, no m/r/g, 2+ bilateral pitting edema to thigh, unable to assess for JVD ?Pulmonary/Chest: 2 L nasal cannula, lungs clear to auscultation bilaterally ?Abdominal: soft, non-tender, non-distended ?MSK: normal bulk and tone ?Skin: warm and dry ?Psych: Mood and affect appropriate ? ?Patient Lines/Drains/Airways Status   ? ? Active Line/Drains/Airways   ? ? Name Placement date Placement time Site Days  ? Peripheral IV 03/15/22 20 G 1.88" Right Antecubital 03/15/22  0833  Antecubital  less than 1  ? External Urinary Catheter 03/14/22  2021  --  1  ? ?  ?  ? ?  ? ? ?Pertinent Labs: ? ?  Latest Ref Rng & Units 03/15/2022  ?  8:34 AM 03/14/2022  ?  8:37 AM  03/14/2022  ?  6:24 AM  ?CBC  ?WBC 4.0 - 10.5 K/uL 6.7   9.8     ?Hemoglobin 12.0 - 15.0 g/dL 12.9   11.7   11.9    ?Hematocrit 36.0 - 46.0 % 40.1   36.5   35.0    ?Platelets 150 - 400 K/uL 235   224     ? ? ? ?  Latest Ref Rng & Units 03/15/2022  ?  8:34 AM 03/14/2022  ?  8:37 AM 03/14/2022  ?  6:24 AM  ?CMP  ?Glucose 70 - 99 mg/dL 205   339   475    ?BUN 8 - 23 mg/dL 39   39   37    ?Creatinine 0.44 - 1.00 mg/dL 2.83   2.71   2.90    ?Sodium 135 - 145 mmol/L 140   138   136    ?Potassium 3.5 - 5.1 mmol/L 3.9   3.1   3.3    ?Chloride 98 - 111 mmol/L 100   98   94    ?CO2 22 - 32 mmol/L 31   28     ?Calcium 8.9 - 10.3 mg/dL 7.7   7.5     ?Total Protein 6.5 - 8.1 g/dL  6.0     ?  Total Bilirubin 0.3 - 1.2 mg/dL  0.6     ?Alkaline Phos 38 - 126 U/L  122     ?AST 15 - 41 U/L  70     ?ALT 0 - 44 U/L  93     ? ? ?Recent Labs  ?  03/15/22 ?0405 03/15/22 ?0820 03/15/22 ?9509  ?GLUCAP 186* 204* 234*  ?  ? ?Pertinent Imaging: ?DG CHEST PORT 1 VIEW ? ?Result Date: 03/14/2022 ?CLINICAL DATA:  Weakness. EXAM: PORTABLE CHEST 1 VIEW COMPARISON:  12/01/2021 FINDINGS: Chronic cardiomegaly. Unchanged mediastinal contours. Trace fissural thickening on the right. Vascular congestion without edema. Right infrahilar atelectasis. No pneumothorax. Chronic changes about the right shoulder with ossified intra-articular bodies. Soft tissue attenuation from habitus limits detailed assessment. IMPRESSION: Chronic cardiomegaly with vascular congestion. Chronic right fissural thickening. Electronically Signed   By: Keith Rake M.D.   On: 03/14/2022 22:12   ? ?ASSESSMENT/PLAN:  ?Assessment: ?Principal Problem: ?  Fall ?Active Problems: ?  Severe uncontrolled hypertension ?  Diabetes mellitus type 2 in obese [facility]) ?  GCA (giant cell arteritis) ([place]) ? ?CHF exacerbation ?Deconditioning/ weakness ?Head CT negative for acute pathology. ?SHe has a history of medication nonadherence.  This is likely the precipitant for her CHF exacerbation in addition to  her poorly controlled hypertension. ?- Patient reporting shortness of breath roughly the same as yesterday.  She has been able to get up out of bed and is sitting in chair.  She appears to be breathing comfortably on 2 L.  Only a small amount of urine output has been recorded thus far.  We will continue to monitor.  If patient has an adequate urine output today can consider increasing Lasix tomorrow to facilitate diuresis. ?- Lasix 80 mg twice daily today ?- Strict I's and O's ?- Daily weights ?- Monitor renal function ?- Echo performed, will follow-up results ?  ?Resistant HTN ?Blood pressure difficult to control despite multiple medications.  There is suspicion by primary care physician for hyperaldosteronism.  Patient received additional 25 of spironolactone today due to persistent blood pressures in the 190s. ?- Metoprolol 50 mg ?- Amlodipine 10 ?-Will increase her Aldactone to 50 mg/day. ?-We will continue to monitor this.  May be able to discontinue additional medications if spironolactone proves more effective. ?  ?CKD ?-Slight elevation in her creatinine this morning 2.83 up from 2.71.  BUN has remained stable.  Urine output 600 cc.  We will continue to monitor this closely. ?- Monitor with daily BMPs.   ?- Avoid nephrotoxic drugs. ?  ?Diabetes ?- CBG monitoring.  Patient has been on systemic steroids.  We will increase her insulin needs as CBG has remained elevated. ?- Sliding scale insulin. ? ?Deconditioning. ?Physical therapy has evaluated the patient, recommending short-term SNF. ?  ?Suspected giant cell arteritis ?Previously noted elevated inflammatory markers and temporal headache. ?Patient is on high-dose prednisone.  Patient's elevated blood pressure, hyperglycemia, and disequilibrium may be intolerance to this medication.  We will continue her on prednisone for now given risks associated with GCA.  Can consider vascular consult in hospital for temporal artery biopsy once blood pressure and CHF  exacerbation and glucose are under control.  If patient is not felt to have evidence of GCA, can discontinue steroids.  We will need to adjust diabetes and blood pressure medications at that time. ? ? ?Signature: ?Delene Ruffini, MD  ?Internal Medicine Resident, PGY-1 ?[facility]  ?Pager: [phone] ?3:05 PM, 03/15/2022  ? ?Please contact  the on call pager after 5 pm and on weekends at [phone]. ? ? ?

## 2022-03-15 NOTE — ED Notes (Signed)
Pt urinated but purawick was not in the proper place and measurement could not be obtained. Purawick placed again in correct position ?

## 2022-03-15 NOTE — ED Notes (Signed)
Katsadouros MD notified again about the high BP, MD stated again to continue to monitor with no further order at this time.  ?

## 2022-03-15 NOTE — ED Notes (Signed)
Attending and residents at bedside. ?

## 2022-03-15 NOTE — ED Notes (Signed)
MD notified of BP. Orders placed ? ?

## 2022-03-16 DIAGNOSIS — F32A Depression, unspecified: Secondary | ICD-10-CM | POA: Diagnosis present

## 2022-03-16 DIAGNOSIS — I130 Hypertensive heart and chronic kidney disease with heart failure and stage 1 through stage 4 chronic kidney disease, or unspecified chronic kidney disease: Secondary | ICD-10-CM | POA: Diagnosis present

## 2022-03-16 DIAGNOSIS — E1165 Type 2 diabetes mellitus with hyperglycemia: Secondary | ICD-10-CM | POA: Diagnosis present

## 2022-03-16 DIAGNOSIS — R42 Dizziness and giddiness: Secondary | ICD-10-CM | POA: Diagnosis not present

## 2022-03-16 DIAGNOSIS — R519 Headache, unspecified: Secondary | ICD-10-CM | POA: Diagnosis present

## 2022-03-16 DIAGNOSIS — I5033 Acute on chronic diastolic (congestive) heart failure: Secondary | ICD-10-CM | POA: Diagnosis present

## 2022-03-16 DIAGNOSIS — J9611 Chronic respiratory failure with hypoxia: Secondary | ICD-10-CM | POA: Diagnosis present

## 2022-03-16 DIAGNOSIS — I5032 Chronic diastolic (congestive) heart failure: Secondary | ICD-10-CM | POA: Diagnosis not present

## 2022-03-16 DIAGNOSIS — K219 Gastro-esophageal reflux disease without esophagitis: Secondary | ICD-10-CM | POA: Diagnosis present

## 2022-03-16 DIAGNOSIS — I509 Heart failure, unspecified: Secondary | ICD-10-CM | POA: Diagnosis not present

## 2022-03-16 DIAGNOSIS — R739 Hyperglycemia, unspecified: Secondary | ICD-10-CM | POA: Diagnosis not present

## 2022-03-16 DIAGNOSIS — E785 Hyperlipidemia, unspecified: Secondary | ICD-10-CM | POA: Diagnosis present

## 2022-03-16 DIAGNOSIS — I110 Hypertensive heart disease with heart failure: Secondary | ICD-10-CM | POA: Diagnosis not present

## 2022-03-16 DIAGNOSIS — T383X6A Underdosing of insulin and oral hypoglycemic [antidiabetic] drugs, initial encounter: Secondary | ICD-10-CM | POA: Diagnosis present

## 2022-03-16 DIAGNOSIS — W19XXXA Unspecified fall, initial encounter: Secondary | ICD-10-CM | POA: Diagnosis not present

## 2022-03-16 DIAGNOSIS — N179 Acute kidney failure, unspecified: Secondary | ICD-10-CM | POA: Diagnosis not present

## 2022-03-16 DIAGNOSIS — E1122 Type 2 diabetes mellitus with diabetic chronic kidney disease: Secondary | ICD-10-CM | POA: Diagnosis present

## 2022-03-16 DIAGNOSIS — E269 Hyperaldosteronism, unspecified: Secondary | ICD-10-CM | POA: Diagnosis not present

## 2022-03-16 DIAGNOSIS — E1151 Type 2 diabetes mellitus with diabetic peripheral angiopathy without gangrene: Secondary | ICD-10-CM | POA: Diagnosis not present

## 2022-03-16 DIAGNOSIS — Z7984 Long term (current) use of oral hypoglycemic drugs: Secondary | ICD-10-CM | POA: Diagnosis not present

## 2022-03-16 DIAGNOSIS — E11649 Type 2 diabetes mellitus with hypoglycemia without coma: Secondary | ICD-10-CM | POA: Diagnosis not present

## 2022-03-16 DIAGNOSIS — I10 Essential (primary) hypertension: Secondary | ICD-10-CM | POA: Diagnosis not present

## 2022-03-16 DIAGNOSIS — Z6841 Body Mass Index (BMI) 40.0 and over, adult: Secondary | ICD-10-CM | POA: Diagnosis not present

## 2022-03-16 DIAGNOSIS — N184 Chronic kidney disease, stage 4 (severe): Secondary | ICD-10-CM | POA: Diagnosis present

## 2022-03-16 DIAGNOSIS — Y92002 Bathroom of unspecified non-institutional (private) residence single-family (private) house as the place of occurrence of the external cause: Secondary | ICD-10-CM | POA: Diagnosis not present

## 2022-03-16 DIAGNOSIS — M316 Other giant cell arteritis: Secondary | ICD-10-CM | POA: Diagnosis not present

## 2022-03-16 DIAGNOSIS — W182XXA Fall in (into) shower or empty bathtub, initial encounter: Secondary | ICD-10-CM | POA: Diagnosis present

## 2022-03-16 DIAGNOSIS — N189 Chronic kidney disease, unspecified: Secondary | ICD-10-CM | POA: Diagnosis not present

## 2022-03-16 DIAGNOSIS — E8809 Other disorders of plasma-protein metabolism, not elsewhere classified: Secondary | ICD-10-CM | POA: Diagnosis present

## 2022-03-16 DIAGNOSIS — Z8673 Personal history of transient ischemic attack (TIA), and cerebral infarction without residual deficits: Secondary | ICD-10-CM | POA: Diagnosis not present

## 2022-03-16 DIAGNOSIS — R531 Weakness: Secondary | ICD-10-CM | POA: Diagnosis not present

## 2022-03-16 DIAGNOSIS — T447X6A Underdosing of beta-adrenoreceptor antagonists, initial encounter: Secondary | ICD-10-CM | POA: Diagnosis present

## 2022-03-16 DIAGNOSIS — Z7982 Long term (current) use of aspirin: Secondary | ICD-10-CM | POA: Diagnosis not present

## 2022-03-16 DIAGNOSIS — Z91128 Patient's intentional underdosing of medication regimen for other reason: Secondary | ICD-10-CM | POA: Diagnosis not present

## 2022-03-16 DIAGNOSIS — Z79899 Other long term (current) drug therapy: Secondary | ICD-10-CM | POA: Diagnosis not present

## 2022-03-16 DIAGNOSIS — Z9981 Dependence on supplemental oxygen: Secondary | ICD-10-CM | POA: Diagnosis not present

## 2022-03-16 LAB — BASIC METABOLIC PANEL
Anion gap: 9 (ref 5–15)
BUN: 45 mg/dL — ABNORMAL HIGH (ref 8–23)
CO2: 32 mmol/L (ref 22–32)
Calcium: 7.5 mg/dL — ABNORMAL LOW (ref 8.9–10.3)
Chloride: 97 mmol/L — ABNORMAL LOW (ref 98–111)
Creatinine, Ser: 3.03 mg/dL — ABNORMAL HIGH (ref 0.44–1.00)
GFR, Estimated: 16 mL/min — ABNORMAL LOW (ref >60)
Glucose, Bld: 77 mg/dL (ref 70–99)
Potassium: 3.5 mmol/L (ref 3.5–5.1)
Sodium: 138 mmol/L (ref 135–145)

## 2022-03-16 LAB — GLUCOSE, CAPILLARY
Glucose-Capillary: 122 mg/dL — ABNORMAL HIGH (ref 70–99)
Glucose-Capillary: 145 mg/dL — ABNORMAL HIGH (ref 70–99)
Glucose-Capillary: 184 mg/dL — ABNORMAL HIGH (ref 70–99)
Glucose-Capillary: 379 mg/dL — ABNORMAL HIGH (ref 70–99)
Glucose-Capillary: 78 mg/dL (ref 70–99)
Glucose-Capillary: 87 mg/dL (ref 70–99)

## 2022-03-16 LAB — SURGICAL PCR SCREEN
MRSA, PCR: NEGATIVE
Staphylococcus aureus: NEGATIVE

## 2022-03-16 LAB — MAGNESIUM: Magnesium: 2 mg/dL (ref 1.7–2.4)

## 2022-03-16 MED ORDER — FUROSEMIDE 10 MG/ML IJ SOLN
120.0000 mg | Freq: Two times a day (BID) | INTRAVENOUS | Status: DC
  Administered 2022-03-16: 120 mg via INTRAVENOUS
  Filled 2022-03-16 (×4): qty 12

## 2022-03-16 NOTE — Consult Note (Addendum)
?Hospital Consult ? ? ? ?Reason for Consult:  temporal artery biopsy, GCA ?Requesting Physician:  Delene Ruffini, MD ?MRN #:  [id number] ? ?History of Present Illness: This is a 71 y.o. female with uncontrolled HTN, HLD, Type II DM, CKD stage III, PAD, and CHF who presents after a fall at home. She was scheduled for Temporal artery biopsy by Dr. Stanford Breed on 03/14/22 but upon arrival she reported to RN that she had fallen the evening before. She reported that she had not been taking her Januvia and Metoprolol because it makes her feel bad, so on peri operative presentation she had uncontrolled hyperglycemia and hypertension. Subsequently her surgery was cancelled and she was directed to ER for further management. She is currently being treated for CHF exacerbation, Deconditioning, Resistant HTN, CKD, DM, and concerns for Giant Cell arteritis. Vascular surgery has been consulted for consideration of temporal artery biopsy during this admission. ? ?She was recently seen in our office on 4/18 by Dr. Stanford Breed it was for workup of Giant cell arteritis. She has had left temporal headaches, dizziness, and generalized fatigue. She explains that her dizziness makes it very hard for her to stand. The dizziness is fairly constant and is worse with moving. Her temporal headaches have been present for several months now. She denies any visual changes or associated auras. She denies any jaw claudication or scalp tenderness. Blood work at PCP prior to her visit with Dr. Stanford Breed showed elevated ESR and CRP. She has been initiated on high dose steroids. She explains that her symptoms remain unchanged over the last several days.  ? ?Past Medical History:  ?Diagnosis Date  ? Allergy   ? Arthritis   ? Chronic headaches   ? Constipation   ? Depression   ? Diabetes mellitus   ? GERD (gastroesophageal reflux disease)   ? Hyperlipidemia   ? Hypertension   ? Obesity   ? PAD (peripheral artery disease) ([place])   ? Shortness of breath   ? Stage 3  chronic kidney disease ([place])   ? Stroke [facility])   ? ? ?Past Surgical History:  ?Procedure Laterality Date  ? ABDOMINAL AORTOGRAM W/LOWER EXTREMITY N/A 12/17/2018  ? Procedure: ABDOMINAL AORTOGRAM W/LOWER EXTREMITY;  Surgeon: Waynetta Sandy, MD;  Location: [facility];  Service: Cardiovascular;  Laterality: N/A;  ? FEMORAL BYPASS  02/23/2011  ? Left Common Femoral to Below-knee popliteasl BPG   by Dr. Bridgett Larsson  ? PERIPHERAL VASCULAR ATHERECTOMY Left 12/17/2018  ? Procedure: PERIPHERAL VASCULAR ATHERECTOMY;  Surgeon: Waynetta Sandy, MD;  Location: [facility];  Service: Cardiovascular;  Laterality: Left;  ? PERIPHERAL VASCULAR BALLOON ANGIOPLASTY Left 12/17/2018  ? Procedure: PERIPHERAL VASCULAR BALLOON ANGIOPLASTY;  Surgeon: Waynetta Sandy, MD;  Location: [facility];  Service: Cardiovascular;  Laterality: Left;  ? ? ?Allergies  ?Allergen Reactions  ? Nifedipine Er Other (See Comments)  ?  Caused nose bleeds in higher doses (90 mg., namely)  ? Atorvastatin Swelling  ?  Lip swelling to lipitor ?Patient tolerates crestor  ? Latex Rash  ? ? ?Prior to Admission medications   ?Medication Sig Start Date End Date Taking? Authorizing Provider  ?acetaminophen (TYLENOL) 650 MG CR tablet Take 1,300 mg by mouth every 8 (eight) hours as needed for pain.   Yes [provider]  ?albuterol (VENTOLIN HFA) 108 (90 Base) MCG/ACT inhaler Inhale 2 puffs into the lungs every 6 (six) hours as needed for wheezing or shortness of breath. 12/10/21  Yes Eulas Post,  [place], MD  ?aspirin 81 MG EC tablet Take 1 tablet (81 mg total) by mouth daily. ?Patient taking differently: Take 81 mg by mouth every evening. 12/03/21  Yes [name], MD  ?furosemide (LASIX) 40 MG tablet Take 40 mg by mouth 2 (two) times daily.   Yes [provider]  ?furosemide (LASIX) 80 MG tablet Take 1 tablet (80 mg total) by mouth 2 (two) times daily. 02/23/22 02/23/23 Yes Gaylan Gerold, DO  ?metoprolol succinate  (TOPROL-XL) 50 MG 24 hr tablet Take 1 tablet (50 mg total) by mouth daily. Take with or immediately following a meal. ?Patient taking differently: Take 50 mg by mouth every evening. Take with or immediately following a meal. 01/07/22 03/14/22 Yes [place], Rayann N, DO  ?nicotine (NICODERM CQ - DOSED IN MG/24 HOURS) 14 mg/24hr patch Place 1 patch (14 mg total) onto the skin daily. 02/23/22 04/06/22 Yes Gaylan Gerold, DO  ?OXYGEN Inhale 2 L into the lungs as needed (oxygen).   Yes [provider]  ?predniSONE (DELTASONE) 20 MG tablet Take 2 tablets (40 mg total) by mouth daily with breakfast. 02/21/22 03/23/22 Yes Aslam, Sadia, MD  ?rosuvastatin (CRESTOR) 10 MG tablet Take 1 tablet (10 mg total) by mouth daily. ?Patient taking differently: Take 10 mg by mouth every evening. 12/03/21 03/14/22 Yes [name], MD  ?sitaGLIPtin (JANUVIA) 25 MG tablet Take 1 tablet (25 mg total) by mouth daily. 08/22/21 03/14/22 Yes [name], MD  ?spironolactone (ALDACTONE) 25 MG tablet Take 1 tablet (25 mg total) by mouth daily. 02/16/22 02/16/23 Yes Katsadouros, Vasilios, MD  ?amLODipine (NORVASC) 10 MG tablet Take 1 tablet (10 mg total) by mouth daily. ?Patient not taking: Reported on 03/09/2022 02/16/22 02/16/23  Riesa Pope, MD  ?blood glucose meter kit and supplies per insurance preference. Check cbg once a day. Dx E11.9 01/06/20   Jacelyn Pi, Lilia Argue, MD  ?diclofenac Sodium (VOLTAREN) 1 % GEL Apply 2 g topically 3 (three) times daily as needed (Pain). 12/03/21   [name], MD  ? ? ?Social History  ? ?Socioeconomic History  ? Marital status: Single  ?  Spouse name: Not on file  ? Number of children: Not on file  ? Years of education: Not on file  ? Highest education level: Not on file  ?Occupational History  ? Not on file  ?Tobacco Use  ? Smoking status: Light Smoker  ?  Packs/day: 0.50  ?  Years: 35.00  ?  Pack years: 17.50  ?  Types: Cigarettes  ? Smokeless tobacco: Never  ? Tobacco comments:  ?  patient is trying to quit  and has gone down to half a pack per day  ?Vaping Use  ? Vaping Use: Never used  ?Substance and Sexual Activity  ? Alcohol use: Yes  ?  Alcohol/week: 1.0 standard drink  ?  Types: 1 Standard drinks or equivalent per week  ?  Comment: occasional use only  ? Drug use: No  ? Sexual activity: Not Currently  ?Other Topics Concern  ? Not on file  ?Social History Narrative  ? Not on file  ? ?Social Determinants of Health  ? ?Financial Resource Strain: Not on file  ?Food Insecurity: No Food Insecurity  ? Worried About Charity fundraiser in the Last Year: Never true  ? Ran Out of Food in the Last Year: Never true  ?Transportation Needs: Unmet Transportation Needs  ? Lack of Transportation (Medical): Yes  ? Lack of Transportation (Non-Medical): Yes  ?Physical Activity: Inactive  ?  Days of Exercise per Week: 0 days  ? Minutes of Exercise per Session: 0 min  ?Stress: Not on file  ?Social Connections: Not on file  ?Intimate Partner Violence: Not on file  ? ? ? ?Family History  ?Problem Relation Age of Onset  ? Diabetes Father   ? Heart disease Mother   ?     NOT before age 84-  Bypass  ? Hypertension Mother   ? Hypertension Sister   ? Varicose Veins Brother   ? Heart disease Brother   ?     Before age 50  ? Hypertension Daughter   ? Colon cancer Neg Hx   ? Esophageal cancer Neg Hx   ? Rectal cancer Neg Hx   ? Stomach cancer Neg Hx   ? ? ?ROS: Otherwise negative unless mentioned in HPI ? ?Physical Examination ? ?Vitals:  ? 03/16/22 0700 03/16/22 0959  ?BP: (!) 158/70 (!) 186/79  ?Pulse: 66 65  ?Resp: 20 16  ?Temp: 98.3 ?F (36.8 ?C)   ?SpO2: 99% 100%  ? ?Body mass index is 41.66 kg/m?. ? ?General:  WDWN in NAD ?Gait: Not observed ?HENT: WNL, normocephalic. Tenderness over left temporal region ?Pulmonary: normal non-labored breathing, without  wheezing ?Cardiac:regular without  Murmurs, without carotid bruits ?Abdomen: obese, soft ?Extremities: without ischemic changes, without Gangrene , without cellulitis; without open wounds;  edema of bilateral lower extremities ?Musculoskeletal: no muscle wasting or atrophy  ?Neurologic: A&O X 3;  No focal weakness or paresthesias are detected; speech is fluent/normal ?Psychiatric:  The pt has Nor

## 2022-03-16 NOTE — [place] FL2 (Signed)
?[place] MEDICAID FL2 LEVEL OF CARE SCREENING TOOL  ?  ? ?IDENTIFICATION  ?Patient Name: ?Alicia [name] Birthdate: 05-08-1951 Sex: female Admission Date (Current Location): ?03/14/2022  ?[place] and [place] Number: ? [place] ?  Facility and Address:  ?[facility], [place] [address], [place], [place] [address] ?     Provider Number: ?[id number]  ?Attending Physician Name and Address:  ?Axel Filler, * ? Relative Name and Phone Number:  ?Yennifer Segovia (Daughter) [phone] ?   ?Current Level of Care: ?Hospital Recommended Level of Care: ?[place] Prior Approval Number: ?  ? ?Date Approved/Denied: ?  PASRR Number: ?[id number] A ? ?Discharge Plan: ?SNF ?  ? ?Current Diagnoses: ?Patient Active Problem List  ? Diagnosis Date Noted  ? GCA (giant cell arteritis) ([place]) 03/15/2022  ? Fall 03/14/2022  ? Headache 02/16/2022  ? Disequilibrium 12/01/2021  ? (HFpEF) heart failure with preserved ejection fraction ([place]) 07/18/2021  ? Exacerbation of RAD (reactive airway disease) 02/27/2020  ? CKD (chronic kidney disease) stage 4, GFR 15-29 ml/min (HCC) 02/27/2020  ? Tobacco use 02/27/2020  ? Prolonged Q-T interval on ECG 04/27/2018  ? Diabetes mellitus type 2 in obese ([place]) 03/16/2018  ? Severe obesity (BMI >= 40) (HCC)   ? CVA (cerebral vascular accident) ([place]) 03/15/2018  ? Peripheral vascular disease, unspecified ([place]) 11/30/2012  ? Hyperlipidemia 07/20/2007  ? Severe uncontrolled hypertension 07/20/2007  ? ? ?Orientation RESPIRATION BLADDER Height & Weight   ?  ?Self, Time, Situation, Place ? O2 ([place]) Continent, External catheter Weight: 266 lb (120.7 kg) ?Height:  '5\' 7"'$  (170.2 cm)  ?BEHAVIORAL SYMPTOMS/MOOD NEUROLOGICAL BOWEL NUTRITION STATUS  ?    Continent Diet (See DC summary)  ?AMBULATORY STATUS COMMUNICATION OF NEEDS Skin   ?Extensive Assist Verbally Skin abrasions (L arm) ?  ?  ?  ?    ?     ?     ? ? ?Personal Care Assistance Level of Assistance  ?Bathing,  Feeding, Dressing Bathing Assistance: Maximum assistance ?Feeding assistance: Limited assistance ?Dressing Assistance: Maximum assistance ?   ? ?Functional Limitations Info  ?Sight, Hearing, Speech Sight Info: Adequate ?Hearing Info: Adequate ?Speech Info: Adequate  ? ? ?SPECIAL CARE FACTORS FREQUENCY  ?PT (By licensed PT), OT (By licensed OT)   ?  ?PT Frequency: 5x week ?OT Frequency: 5x week ?  ?  ?  ?   ? ? ?Contractures Contractures Info: Not present  ? ? ?Additional Factors Info  ?Code Status, Allergies, Insulin Sliding Scale Code Status Info: Full ?Allergies Info: Nifedipine Er   Atorvastatin   Latex ?  ?Insulin Sliding Scale Info: Insulin Aspart (Novolog) 0-20 U every 4 hours ?  ?   ? ?Current Medications (03/16/2022):  This is the current hospital active medication list ?Current Facility-Administered Medications  ?Medication Dose Route Frequency Provider Last Rate Last Admin  ? acetaminophen (TYLENOL) tablet 1,000 mg  1,000 mg Oral Q6H PRN Katsadouros, Vasilios, MD   1,000 mg at 03/16/22 0958  ? albuterol (PROVENTIL) (2.5 MG/3ML) 0.083% nebulizer solution 3 mL  3 mL Inhalation Q6H PRN Katsadouros, Vasilios, MD      ? amLODipine (NORVASC) tablet 10 mg  10 mg Oral Daily Katsadouros, Vasilios, MD   10 mg at 03/16/22 0941  ? enoxaparin (LOVENOX) injection 30 mg  30 mg Subcutaneous Q24H Katsadouros, Vasilios, MD   30 mg at 03/15/22 2135  ? furosemide (LASIX) injection 80 mg  80 mg Intravenous BID Riesa Pope, MD   80  mg at 03/16/22 0940  ? insulin aspart (novoLOG) injection 0-20 Units  0-20 Units Subcutaneous Q4H Delene Ruffini, MD   7 Units at 03/15/22 2045  ? metoprolol succinate (TOPROL-XL) 24 hr tablet 50 mg  50 mg Oral QPM Katsadouros, Vasilios, MD   50 mg at 03/15/22 1815  ? nicotine (NICODERM CQ - dosed in mg/24 hours) patch 14 mg  14 mg Transdermal Daily Katsadouros, Vasilios, MD   14 mg at 03/16/22 0949  ? predniSONE (DELTASONE) tablet 40 mg  40 mg Oral Q breakfast Katsadouros, Vasilios, MD    40 mg at 03/16/22 0941  ? rosuvastatin (CRESTOR) tablet 10 mg  10 mg Oral QPM Katsadouros, Vasilios, MD   10 mg at 03/15/22 1815  ? spironolactone (ALDACTONE) tablet 50 mg  50 mg Oral Daily Delene Ruffini, MD   50 mg at 03/16/22 0941  ? ? ? ?Discharge Medications: ?Please see discharge summary for a list of discharge medications. ? ?Relevant Imaging Results: ? ?Relevant Lab Results: ? ? ?Additional Information ?SS# [phone] ? ?Coralee Pesa, LCSWA ? ? ? ? ?

## 2022-03-16 NOTE — Progress Notes (Addendum)
? ? ?HD#0 ?SUBJECTIVE:  ?Patient Summary: Alicia [name] is a 71 y.o. with a pertinent PMH of hypertension, CHF, who presented with weakness and fall and admitted for hypertension, hyperglycemia.  ? ?Overnight Events: NAEO  ? ?  ?Interm History:  Patient sitting up at bedside. States she feels somewhat better today. States she feels short of breath without O2 during exertion. Was able to sit in her chair for several hours.  ? ?OBJECTIVE:  ?Vital Signs: ?Vitals:  ? 03/16/22 0333 03/16/22 0400 03/16/22 0700 03/16/22 0959  ?BP:  (!) 154/71 (!) 158/70 (!) 186/79  ?Pulse:  64 66 65  ?Resp:  '15 20 16  '$ ?Temp:   98.3 ?F (36.8 ?C)   ?TempSrc:   Oral   ?SpO2:  100% 99% 100%  ?Weight: 120.7 kg     ?Height:      ? ?Supplemental O2:  ?SpO2: 100 % ?O2 Flow Rate (L/min): 2 L/min ? ?Filed Weights  ? 03/15/22 0823 03/15/22 1715 03/16/22 0333  ?Weight: 116.8 kg 121 kg 120.7 kg  ? ? ? ?Intake/Output Summary (Last 24 hours) at 03/16/2022 1119 ?Last data filed at 03/16/2022 0900 ?Gross per 24 hour  ?Intake 700 ml  ?Output 600 ml  ?Net 100 ml  ? ? ?Net IO Since Admission: -20 mL [03/16/22 1119] ? ?Physical Exam: ?Constitutional: Chronically ill-appearing female lying in bed, in no acute distress, appears improved today, in better spirits ?Cardiovascular: regular rate and rhythm, no m/r/g, 2+ bilateral pitting edema to knee, no appreciable JVD ?Pulmonary/Chest: breathing comfortably on RA, lungs clear to auscultation bilaterally ?Abdominal: soft, non-tender, non-distended ?MSK: normal bulk and tone ?Skin: warm and dry ?Psych: Mood and affect appropriate ? ?Patient Lines/Drains/Airways Status   ? ? Active Line/Drains/Airways   ? ? Name Placement date Placement time Site Days  ? Peripheral IV 03/15/22 20 G 1.88" Right Antecubital 03/15/22  0833  Antecubital  less than 1  ? External Urinary Catheter 03/14/22  2021  --  1  ? ?  ?  ? ?  ? ? ?Pertinent Labs: ? ?  Latest Ref Rng & Units 03/15/2022  ?  8:34 AM 03/14/2022  ?  8:37 AM 03/14/2022   ?  6:24 AM  ?CBC  ?WBC 4.0 - 10.5 K/uL 6.7   9.8     ?Hemoglobin 12.0 - 15.0 g/dL 12.9   11.7   11.9    ?Hematocrit 36.0 - 46.0 % 40.1   36.5   35.0    ?Platelets 150 - 400 K/uL 235   224     ? ? ? ?  Latest Ref Rng & Units 03/16/2022  ?  2:59 AM 03/15/2022  ?  8:34 AM 03/14/2022  ?  8:37 AM  ?CMP  ?Glucose 70 - 99 mg/dL 77   205   339    ?BUN 8 - 23 mg/dL 45   39   39    ?Creatinine 0.44 - 1.00 mg/dL 3.03   2.83   2.71    ?Sodium 135 - 145 mmol/L 138   140   138    ?Potassium 3.5 - 5.1 mmol/L 3.5   3.9   3.1    ?Chloride 98 - 111 mmol/L 97   100   98    ?CO2 22 - 32 mmol/L 32   31   28    ?Calcium 8.9 - 10.3 mg/dL 7.5   7.7   7.5    ?Total Protein 6.5 - 8.1 g/dL   6.0    ?  Total Bilirubin 0.3 - 1.2 mg/dL   0.6    ?Alkaline Phos 38 - 126 U/L   122    ?AST 15 - 41 U/L   70    ?ALT 0 - 44 U/L   93    ? ? ?Recent Labs  ?  03/16/22 ?9373 03/16/22 ?0408 03/16/22 ?4287  ?GLUCAP 78 87 122*  ? ?  ? ?ASSESSMENT/PLAN:  ?Assessment: ?Principal Problem: ?  Fall ?Active Problems: ?  Severe uncontrolled hypertension ?  Diabetes mellitus type 2 in obese [facility]) ?  GCA (giant cell arteritis) ([place]) ? ?Acute on Chronic HFpEF ?SHe has a history of medication nonadherence.  This is likely the precipitant for her CHF exacerbation in addition to her poorly controlled hypertension. ?- Patient had echo performed yesterday which showed EF 55 to 60% and grade 2 diastolic dysfunction.  No regional wall motion abnormalities. ?-Patient appears improved today.  She is sitting up at bedside breathing comfortably on room air.  She does endorse dyspnea and shortness of breath with exertion still.  She does have some remaining lower extremity edema.  Urine output roughly 1 L yesterday.  Renal function has remained stable.   ?- Increase Lasix to 120 twice daily ?- Strict I's and O's ?- Daily weights ?- OOB when able, encourage ambulation  ?-Oxygen as needed for exertion ?- Monitor renal function ?  ?Chronic Severe Asymptomatic Hypertension ?Blood pressure  difficult to control despite multiple medications.  There is suspicion by primary care physician for hyperaldosteronism.  Patient responded well to additional dose of spironolactone 25 mg yesterday with improvement in blood pressure down to 150.  We will continue her current regimen today. ?- Metoprolol 50 mg ?- Amlodipine 10 ?-Will increase her Aldactone to 50 mg/day. ?-We will continue to monitor this.  May be able to discontinue additional medications if spironolactone proves more effective. ?  ?Diabetes ?- CBG monitoring.  Insulin scale dose was increased yesterday to accommodate for prednisone.  Blood sugar has been better controlled with a.m. CBG 87. ?- Sliding scale insulin. ? ?Suspected giant cell arteritis ?Steroid intolerance ?Previously noted elevated inflammatory markers and temporal headache. ?Patient is on high-dose prednisone.  Patient's elevated blood pressure, hyperglycemia, and disequilibrium may be intolerance to this medication.  We will continue her on prednisone for now given risks associated with GCA.  We are working to medically optimize her at this time.  Diuresing for CHF, optimizing blood pressure control, managing diabetes.  Vascular surgery has been consulted for possible temporal artery biopsy during this hospitalization as biopsy result will determine prednisone dosing after discharge. ? ?CKD ?BUN/creatinine have remained stable compared to yesterday w/ slight increase overall.  Urine output 1.2 L overnight. ?- Monitor with daily BMPs.   ?- Avoid nephrotoxic drugs. ? ?Deconditioning. ?Physical therapy has evaluated the patient, recommending short-term SNF.  Social work has been consulted and FL 2 has been signed. ? ? ?Signature: ?Delene Ruffini, MD  ?Internal Medicine Resident, PGY-1 ?[facility]  ?Pager: [phone] ?11:19 AM, 03/16/2022  ? ?Please contact the on call pager after 5 pm and on weekends at [phone]. ? ? ?

## 2022-03-16 NOTE — Progress Notes (Signed)
Physical Therapy Treatment ?Patient Details ?Name: Alicia [name] ?MRN: [id number] ?DOB: 1951/09/02 ?Today's Date: 03/16/2022 ? ? ?History of Present Illness Pt is a 71 y.o. female who presented on 4/24 for temporal artery biopsy but surgery cancelled due to fall, generalized weakness, hyperglycemia, and persistent hypertension. PMH: chronic diastolic heart failure, hyperlipidemia, hypertension, peripheral vascular disease, chronic respiratory failure on 2L supplemental oxygen, previous CVA, type II diabetes mellitus, class III obesity, chronic renal insufficiency. ? ?  ?PT Comments  ? ? Pt is making good, steady progress towards her PT goals as she was able to perform transfers and ambulate up to ~16 ft with a RW and minA today. She continues to fatigue quickly and display lower extremity weakness, impacting her ease and independence with transferring to stand. She requires assistance to transition her weight anteriorly to come to stand from standard low surfaces also. Will continue to follow acutely. Current recommendations remain appropriate. ?   ?Recommendations for follow up therapy are one component of a multi-disciplinary discharge planning process, led by the attending physician.  Recommendations may be updated based on patient status, additional functional criteria and insurance authorization. ? ?Follow Up Recommendations ? Skilled nursing-short term rehab (<3 hours/day) ?  ?  ?Assistance Recommended at Discharge Intermittent Supervision/Assistance  ?Patient can return home with the following A little help with bathing/dressing/bathroom;Assistance with cooking/housework;Assist for transportation;Help with stairs or ramp for entrance;A little help with walking and/or transfers ?  ?Equipment Recommendations ? None recommended by PT  ?  ?Recommendations for Other Services   ? ? ?  ?Precautions / Restrictions Precautions ?Precautions: Fall ?Precaution Comments: monitor SpO2 and BP ?Restrictions ?Weight  Bearing Restrictions: No  ?  ? ?Mobility ? Bed Mobility ?Overal bed mobility: Needs Assistance ?Bed Mobility: Supine to Sit ?  ?  ?Supine to sit: Supervision, HOB elevated ?  ?  ?General bed mobility comments: Supervision and use of bed rail to pull trunk up to sit L EOB, HOB elevated. ?  ? ?Transfers ?Overall transfer level: Needs assistance ?Equipment used: Rolling walker (2 wheels) ?Transfers: Sit to/from Stand, Bed to chair/wheelchair/BSC ?Sit to Stand: Min assist ?  ?Step pivot transfers: Min assist ?  ?  ?  ?General transfer comment: Cues to push up from bed or recliner, minA to power up to stand and provide anterior transition of weight. MinA to steady with stand step to R bed > recliner. ?  ? ?Ambulation/Gait ?Ambulation/Gait assistance: Min assist ?Gait Distance (Feet): 16 Feet (x2 bouts of ~2 ft > ~16 ft) ?Assistive device: Rolling walker (2 wheels) ?Gait Pattern/deviations: Step-through pattern, Decreased stride length ?Gait velocity: reduced ?Gait velocity interpretation: <1.31 ft/sec, indicative of household ambulator ?  ?General Gait Details: Pt with slow, small steps between bed and recliner or repeated anterior <> posterior steps in front of recliner. Pt with unsteadiness, needing minA for stability. Pt easily fatigues. ? ? ?Stairs ?  ?  ?  ?  ?  ? ? ?Wheelchair Mobility ?  ? ?Modified Rankin (Stroke Patients Only) ?  ? ? ?  ?Balance Overall balance assessment: Needs assistance ?Sitting-balance support: Feet supported, No upper extremity supported ?Sitting balance-Leahy Scale: Fair ?  ?  ?Standing balance support: Bilateral upper extremity supported, During functional activity, Reliant on assistive device for balance ?Standing balance-Leahy Scale: Poor ?Standing balance comment: Reliant on RW and up to minA ?  ?  ?  ?  ?  ?  ?  ?  ?  ?  ?  ?  ? ?  ?  Cognition Arousal/Alertness: Awake/alert ?Behavior During Therapy: [facility] for tasks assessed/performed ?Overall Cognitive Status: Within Functional Limits  for tasks assessed ?  ?  ?  ?  ?  ?  ?  ?  ?  ?  ?  ?  ?  ?  ?  ?  ?  ?  ?  ? ?  ?Exercises   ? ?  ?General Comments General comments (skin integrity, edema, etc.): SpO2 down to mid-80s%, needing 2L O2 via [place] to rebound to 90s% ?  ?  ? ?Pertinent Vitals/Pain Pain Assessment ?Pain Assessment: Faces ?Faces Pain Scale: No hurt ?Pain Intervention(s): Monitored during session  ? ? ?Home Living   ?  ?  ?  ?  ?  ?  ?  ?  ?  ?   ?  ?Prior Function    ?  ?  ?   ? ?PT Goals (current goals can now be found in the care plan section) Acute Rehab PT Goals ?Patient Stated Goal: to improve ?PT Goal Formulation: With patient ?Time For Goal Achievement: 03/29/22 ?Potential to Achieve Goals: Good ?Progress towards PT goals: Progressing toward goals ? ?  ?Frequency ? ? ? Min 3X/week ? ? ? ?  ?PT Plan Current plan remains appropriate  ? ? ?Co-evaluation   ?  ?  ?  ?  ? ?  ?AM-PAC PT "6 Clicks" Mobility   ?Outcome Measure ? Help needed turning from your back to your side while in a flat bed without using bedrails?: A Little ?Help needed moving from lying on your back to sitting on the side of a flat bed without using bedrails?: A Little ?Help needed moving to and from a bed to a chair (including a wheelchair)?: A Little ?Help needed standing up from a chair using your arms (e.g., wheelchair or bedside chair)?: A Little ?Help needed to walk in hospital room?: A Lot ?Help needed climbing 3-5 steps with a railing? : Total ?6 Click Score: 15 ? ?  ?End of Session Equipment Utilized During Treatment: Gait belt;Oxygen ?Activity Tolerance: Patient tolerated treatment well ?Patient left: in chair;with call bell/phone within reach;with chair alarm set ?  ?PT Visit Diagnosis: Muscle weakness (generalized) (M62.81);Difficulty in walking, not elsewhere classified (R26.2);Unsteadiness on feet (R26.81);Other abnormalities of gait and mobility (R26.89);History of falling (Z91.81) ?  ? ? ?Time: 4076-8088 ?PT Time Calculation (min) (ACUTE ONLY): 27  min ? ?Charges:  $Gait Training: 8-22 mins ?$Therapeutic Activity: 8-22 mins          ?          ? ?Moishe Spice, PT, DPT ?Acute Rehabilitation Services  ?Pager: [phone] ?Office: [phone] ? ? ? ?Alicia [name] ?03/16/2022, 4:41 PM ? ?

## 2022-03-17 ENCOUNTER — Inpatient Hospital Stay (HOSPITAL_COMMUNITY): Payer: Medicare HMO | Admitting: Certified Registered"

## 2022-03-17 ENCOUNTER — Encounter (HOSPITAL_COMMUNITY)
Admission: RE | Disposition: A | Discharge status: Skilled Nursing Facility | Payer: Self-pay | Source: Home / Self Care | Attending: Student in an Organized Health Care Education/Training Program

## 2022-03-17 ENCOUNTER — Encounter (HOSPITAL_COMMUNITY): Payer: Self-pay | Admitting: Student in an Organized Health Care Education/Training Program

## 2022-03-17 ENCOUNTER — Other Ambulatory Visit: Payer: Self-pay

## 2022-03-17 DIAGNOSIS — R42 Dizziness and giddiness: Secondary | ICD-10-CM | POA: Diagnosis not present

## 2022-03-17 DIAGNOSIS — W19XXXA Unspecified fall, initial encounter: Secondary | ICD-10-CM | POA: Diagnosis not present

## 2022-03-17 DIAGNOSIS — R531 Weakness: Secondary | ICD-10-CM | POA: Diagnosis not present

## 2022-03-17 DIAGNOSIS — N189 Chronic kidney disease, unspecified: Secondary | ICD-10-CM

## 2022-03-17 DIAGNOSIS — I130 Hypertensive heart and chronic kidney disease with heart failure and stage 1 through stage 4 chronic kidney disease, or unspecified chronic kidney disease: Secondary | ICD-10-CM

## 2022-03-17 DIAGNOSIS — I10 Essential (primary) hypertension: Secondary | ICD-10-CM | POA: Diagnosis not present

## 2022-03-17 DIAGNOSIS — M316 Other giant cell arteritis: Secondary | ICD-10-CM

## 2022-03-17 DIAGNOSIS — I5032 Chronic diastolic (congestive) heart failure: Secondary | ICD-10-CM

## 2022-03-17 DIAGNOSIS — R739 Hyperglycemia, unspecified: Secondary | ICD-10-CM | POA: Diagnosis not present

## 2022-03-17 HISTORY — PX: ARTERY BIOPSY: SHX891

## 2022-03-17 LAB — BASIC METABOLIC PANEL
Anion gap: 9 (ref 5–15)
BUN: 47 mg/dL — ABNORMAL HIGH (ref 8–23)
CO2: 31 mmol/L (ref 22–32)
Calcium: 7.7 mg/dL — ABNORMAL LOW (ref 8.9–10.3)
Chloride: 99 mmol/L (ref 98–111)
Creatinine, Ser: 3.08 mg/dL — ABNORMAL HIGH (ref 0.44–1.00)
GFR, Estimated: 16 mL/min — ABNORMAL LOW (ref >60)
Glucose, Bld: 66 mg/dL — ABNORMAL LOW (ref 70–99)
Potassium: 3.5 mmol/L (ref 3.5–5.1)
Sodium: 139 mmol/L (ref 135–145)

## 2022-03-17 LAB — GLUCOSE, CAPILLARY
Glucose-Capillary: 118 mg/dL — ABNORMAL HIGH (ref 70–99)
Glucose-Capillary: 124 mg/dL — ABNORMAL HIGH (ref 70–99)
Glucose-Capillary: 124 mg/dL — ABNORMAL HIGH (ref 70–99)
Glucose-Capillary: 125 mg/dL — ABNORMAL HIGH (ref 70–99)
Glucose-Capillary: 157 mg/dL — ABNORMAL HIGH (ref 70–99)
Glucose-Capillary: 172 mg/dL — ABNORMAL HIGH (ref 70–99)
Glucose-Capillary: 211 mg/dL — ABNORMAL HIGH (ref 70–99)
Glucose-Capillary: 462 mg/dL — ABNORMAL HIGH (ref 70–99)
Glucose-Capillary: 68 mg/dL — ABNORMAL LOW (ref 70–99)
Glucose-Capillary: 73 mg/dL (ref 70–99)

## 2022-03-17 LAB — MAGNESIUM: Magnesium: 2 mg/dL (ref 1.7–2.4)

## 2022-03-17 LAB — PHOSPHORUS: Phosphorus: 4.3 mg/dL (ref 2.5–4.6)

## 2022-03-17 SURGERY — BIOPSY TEMPORAL ARTERY
Anesthesia: Monitor Anesthesia Care | Laterality: Left

## 2022-03-17 MED ORDER — INSULIN ASPART 100 UNIT/ML IJ SOLN
0.0000 [IU] | Freq: Three times a day (TID) | INTRAMUSCULAR | Status: DC
  Administered 2022-03-18: 3 [IU] via SUBCUTANEOUS
  Administered 2022-03-18 (×2): 20 [IU] via SUBCUTANEOUS

## 2022-03-17 MED ORDER — FUROSEMIDE 10 MG/ML IJ SOLN
120.0000 mg | Freq: Two times a day (BID) | INTRAVENOUS | Status: DC
  Administered 2022-03-17: 120 mg via INTRAVENOUS
  Filled 2022-03-17: qty 10
  Filled 2022-03-17: qty 12

## 2022-03-17 MED ORDER — CEFAZOLIN SODIUM-DEXTROSE 2-4 GM/100ML-% IV SOLN
INTRAVENOUS | Status: AC
  Filled 2022-03-17: qty 100

## 2022-03-17 MED ORDER — CEFAZOLIN SODIUM-DEXTROSE 2-3 GM-%(50ML) IV SOLR
INTRAVENOUS | Status: DC | PRN
  Administered 2022-03-17: 2 g via INTRAVENOUS

## 2022-03-17 MED ORDER — SODIUM CHLORIDE 0.9 % IV SOLN
INTRAVENOUS | Status: DC
Start: 2022-03-17 — End: 2022-03-24

## 2022-03-17 MED ORDER — INSULIN ASPART 100 UNIT/ML IJ SOLN
25.0000 [IU] | Freq: Once | INTRAMUSCULAR | Status: AC
  Administered 2022-03-17: 25 [IU] via SUBCUTANEOUS

## 2022-03-17 MED ORDER — DEXTROSE 50 % IV SOLN
INTRAVENOUS | Status: AC
  Filled 2022-03-17: qty 50

## 2022-03-17 MED ORDER — ORAL CARE MOUTH RINSE: 15.0000 mL | Freq: Once | OROMUCOSAL | Status: AC

## 2022-03-17 MED ORDER — LACTATED RINGERS IV SOLN: INTRAVENOUS | Status: DC

## 2022-03-17 MED ORDER — 0.9 % SODIUM CHLORIDE (POUR BTL) OPTIME
TOPICAL | Status: DC | PRN
  Administered 2022-03-17: 1000 mL

## 2022-03-17 MED ORDER — HYDROCODONE-ACETAMINOPHEN 5-325 MG PO TABS
1.0000 | ORAL_TABLET | Freq: Four times a day (QID) | ORAL | Status: AC | PRN
  Administered 2022-03-17 – 2022-03-19 (×5): 1 via ORAL
  Filled 2022-03-17 (×5): qty 1

## 2022-03-17 MED ORDER — FENTANYL CITRATE (PF) 250 MCG/5ML IJ SOLN
INTRAMUSCULAR | Status: DC | PRN
  Administered 2022-03-17 (×2): 25 ug via INTRAVENOUS

## 2022-03-17 MED ORDER — CHLORHEXIDINE GLUCONATE 0.12 % MT SOLN
15.0000 mL | Freq: Once | OROMUCOSAL | Status: AC
  Administered 2022-03-17: 15 mL via OROMUCOSAL
  Filled 2022-03-17: qty 15

## 2022-03-17 MED ORDER — DEXTROSE 50 % IV SOLN
12.5000 g | INTRAVENOUS | Status: AC
  Administered 2022-03-17: 12.5 g via INTRAVENOUS

## 2022-03-17 MED ORDER — FENTANYL CITRATE (PF) 250 MCG/5ML IJ SOLN
INTRAMUSCULAR | Status: AC
  Filled 2022-03-17: qty 5

## 2022-03-17 MED ORDER — LIDOCAINE HCL (PF) 1 % IJ SOLN
INTRAMUSCULAR | Status: AC
  Filled 2022-03-17: qty 30

## 2022-03-17 MED ORDER — INSULIN ASPART 100 UNIT/ML IJ SOLN: 0.0000 [IU] | INTRAMUSCULAR | Status: DC | PRN

## 2022-03-17 MED ORDER — LABETALOL HCL 5 MG/ML IV SOLN
5.0000 mg | INTRAVENOUS | Status: AC | PRN
  Administered 2022-03-17 (×2): 5 mg via INTRAVENOUS

## 2022-03-17 MED ORDER — LIDOCAINE HCL (PF) 1 % IJ SOLN
INTRAMUSCULAR | Status: DC | PRN
  Administered 2022-03-17: 3 mL

## 2022-03-17 MED ORDER — PROPOFOL 500 MG/50ML IV EMUL
INTRAVENOUS | Status: DC | PRN
  Administered 2022-03-17: 75 ug/kg/min via INTRAVENOUS

## 2022-03-17 MED ORDER — LABETALOL HCL 5 MG/ML IV SOLN
INTRAVENOUS | Status: AC
  Filled 2022-03-17: qty 4

## 2022-03-17 SURGICAL SUPPLY — 49 items
ADH SKN CLS APL DERMABOND .7 (GAUZE/BANDAGES/DRESSINGS) ×1
APL PRP STRL LF DISP 70% ISPRP (MISCELLANEOUS)
APL SKNCLS STERI-STRIP NONHPOA (GAUZE/BANDAGES/DRESSINGS)
BAG COUNTER SPONGE SURGICOUNT (BAG) ×1 IMPLANT
BAG SPNG CNTER NS LX DISP (BAG)
BALL CTTN LRG ABS STRL LF (GAUZE/BANDAGES/DRESSINGS) ×1
BENZOIN TINCTURE PRP APPL 2/3 (GAUZE/BANDAGES/DRESSINGS) ×1 IMPLANT
CANISTER SUCT 3000ML PPV (MISCELLANEOUS) ×2 IMPLANT
CHLORAPREP W/TINT 26 (MISCELLANEOUS) ×1 IMPLANT
CLIP RETRACTION 3.0MM CORONARY (MISCELLANEOUS) IMPLANT
CNTNR URN SCR LID CUP LEK RST (MISCELLANEOUS) ×1 IMPLANT
CONT SPEC 4OZ STRL OR WHT (MISCELLANEOUS) ×2
COTTONBALL LRG STERILE PKG (GAUZE/BANDAGES/DRESSINGS) ×2 IMPLANT
COVER SURGICAL LIGHT HANDLE (MISCELLANEOUS) ×2 IMPLANT
DECANTER SPIKE VIAL GLASS SM (MISCELLANEOUS) ×1 IMPLANT
DERMABOND ADVANCED (GAUZE/BANDAGES/DRESSINGS) ×1
DERMABOND ADVANCED .7 DNX12 (GAUZE/BANDAGES/DRESSINGS) ×1 IMPLANT
DRAPE OPHTHALMIC 77X100 STRL (CUSTOM PROCEDURE TRAY) ×2 IMPLANT
ELECT NDL BLADE 2-5/6 (NEEDLE) ×1 IMPLANT
ELECT NEEDLE BLADE 2-5/6 (NEEDLE) ×2 IMPLANT
ELECT REM PT RETURN 9FT ADLT (ELECTROSURGICAL) ×2
ELECTRODE REM PT RTRN 9FT ADLT (ELECTROSURGICAL) ×1 IMPLANT
GAUZE 4X4 16PLY ~~LOC~~+RFID DBL (SPONGE) ×2 IMPLANT
GEL ULTRASOUND 8.5O AQUASONIC (MISCELLANEOUS) ×2 IMPLANT
GLOVE SURG SS PI 8.0 STRL IVOR (GLOVE) ×2 IMPLANT
GOWN STRL REUS W/ TWL LRG LVL3 (GOWN DISPOSABLE) ×1 IMPLANT
GOWN STRL REUS W/ TWL XL LVL3 (GOWN DISPOSABLE) ×1 IMPLANT
GOWN STRL REUS W/TWL LRG LVL3 (GOWN DISPOSABLE) ×2
GOWN STRL REUS W/TWL XL LVL3 (GOWN DISPOSABLE) ×2
KIT BASIN OR (CUSTOM PROCEDURE TRAY) ×2 IMPLANT
KIT TURNOVER KIT B (KITS) ×2 IMPLANT
LOOP VESSEL MINI RED (MISCELLANEOUS) ×2 IMPLANT
NDL HYPO 25GX1X1/2 BEV (NEEDLE) ×1 IMPLANT
NEEDLE HYPO 25GX1X1/2 BEV (NEEDLE) ×2 IMPLANT
NS IRRIG 1000ML POUR BTL (IV SOLUTION) ×2 IMPLANT
PACK GENERAL/GYN (CUSTOM PROCEDURE TRAY) ×2 IMPLANT
PAD ARMBOARD 7.5X6 YLW CONV (MISCELLANEOUS) ×4 IMPLANT
STRIP CLOSURE SKIN 1/2X4 (GAUZE/BANDAGES/DRESSINGS) ×2 IMPLANT
SUCTION FRAZIER HANDLE 10FR (MISCELLANEOUS) ×2
SUCTION TUBE FRAZIER 10FR DISP (MISCELLANEOUS) ×1 IMPLANT
SUT MNCRL AB 4-0 PS2 18 (SUTURE) ×2 IMPLANT
SUT PROLENE 6 0 BV (SUTURE) IMPLANT
SUT SILK 3 0 (SUTURE)
SUT SILK 3-0 18XBRD TIE 12 (SUTURE) IMPLANT
SUT VIC AB 3-0 SH 27 (SUTURE) ×2
SUT VIC AB 3-0 SH 27X BRD (SUTURE) ×1 IMPLANT
SYR CONTROL 10ML LL (SYRINGE) ×2 IMPLANT
TOWEL GREEN STERILE (TOWEL DISPOSABLE) ×2 IMPLANT
WATER STERILE IRR 1000ML POUR (IV SOLUTION) ×2 IMPLANT

## 2022-03-17 NOTE — Anesthesia Procedure Notes (Signed)
Procedure Name: [place] ?Date/Time: 03/17/2022 1:15 PM ?Performed by: Eligha Bridegroom, CRNA ?Pre-anesthesia Checklist: Patient identified, Emergency Drugs available, Suction available, Patient being monitored and Timeout performed ?Patient Re-evaluated:Patient Re-evaluated prior to induction ?Oxygen Delivery Method: Nasal cannula ?Induction Type: IV induction ? ? ? ? ?

## 2022-03-17 NOTE — Anesthesia Preprocedure Evaluation (Addendum)
Anesthesia Evaluation  ?Patient identified by MRN, date of birth, ID band ?Patient awake ? ? ? ?Reviewed: ?Allergy & Precautions, NPO status , Patient's Chart, lab work & pertinent test results ? ?Airway ?Mallampati: III ? ?TM Distance: >3 FB ?Neck ROM: Full ? ? ? Dental ? ?(+) Edentulous Lower, Edentulous Upper ?  ?Pulmonary ?Current Smoker and Patient abstained from smoking.,  ?  ?breath sounds clear to auscultation ? ? ? ? ? ? Cardiovascular ?hypertension, + Peripheral Vascular Disease and +CHF  ? ?Rhythm:Regular Rate:Normal ? ? ?  ?Neuro/Psych ? Headaches, PSYCHIATRIC DISORDERS Depression CVA   ? GI/Hepatic ?GERD  ,  ?Endo/Other  ?diabetes ? Renal/GU ?Renal disease  ? ?  ?Musculoskeletal ? ?(+) Arthritis ,  ? Abdominal ?(+) + obese,   ?Peds ? Hematology ?  ?Anesthesia Other Findings ? ? Reproductive/Obstetrics ? ?  ? ? ? ? ? ? ? ? ? ? ? ? ? ?  ?  ? ? ? ? ? ? ? ?Anesthesia Physical ?Anesthesia Plan ? ?ASA: 3 ? ?Anesthesia Plan: MAC  ? ?Post-op Pain Management:   ? ?Induction: Intravenous ? ?PONV Risk Score and Plan: 2 and Ondansetron, Midazolam and Propofol infusion ? ?Airway Management Planned: Natural Airway and Simple Face Mask ? ?Additional Equipment: None ? ?Intra-op Plan:  ? ?Post-operative Plan:  ? ?Informed Consent: I have reviewed the patients History and Physical, chart, labs and discussed the procedure including the risks, benefits and alternatives for the proposed anesthesia with the patient or authorized representative who has indicated his/her understanding and acceptance.  ? ? ? ? ? ?Plan Discussed with: CRNA ? ?Anesthesia Plan Comments: (Pt consented for GA with LMA as well. )  ? ? ? ? ? ?Anesthesia Quick Evaluation ? ?

## 2022-03-17 NOTE — Transfer of Care (Signed)
Immediate Anesthesia Transfer of Care Note ? ?Patient: Alicia [name] ? ?Procedure(s) Performed: BIOPSY TEMPORAL ARTERY (Left) ? ?Patient Location: PACU ? ?Anesthesia Type:MAC ? ?Level of Consciousness: awake, alert  and oriented ? ?Airway & Oxygen Therapy: Patient Spontanous Breathing and Patient connected to nasal cannula oxygen ? ?Post-op Assessment: Report given to RN and Post -op Vital signs reviewed and stable ? ?Post vital signs: Reviewed and stable ? ?Last Vitals:  ?Vitals Value Taken Time  ?BP 212/95 03/17/22 1418  ?Temp    ?Pulse 71 03/17/22 1421  ?Resp 16 03/17/22 1421  ?SpO2 100 % 03/17/22 1421  ?Vitals shown include unvalidated device data. ? ?Last Pain:  ?Vitals:  ? 03/17/22 1153  ?TempSrc: Oral  ?PainSc: 0-No pain  ?   ? ?Patients Stated Pain Goal: 3 (03/14/22 1308) ? ?Complications: No notable events documented. ?

## 2022-03-17 NOTE — Op Note (Signed)
DATE OF SERVICE: 03/17/2022 ? ?PATIENT:  Alicia [name]  71 y.o. female ? ?PRE-OPERATIVE DIAGNOSIS:  possible temporal arteritis ? ?POST-OPERATIVE DIAGNOSIS:  Same ? ?PROCEDURE:   ?Left temporal artery biopsy ? ?SURGEON:  Surgeon(s) and Role: ?   * Cherre Robins, MD - Primary ? ?ASSISTANT: none ? ?ANESTHESIA:   local and MAC ? ?EBL: minimal ? ?BLOOD ADMINISTERED:none ? ?DRAINS: none  ? ?LOCAL MEDICATIONS USED:  LIDOCAINE  ? ?SPECIMEN:  left temporal artery ? ?COUNTS: confirmed correct. ? ?TOURNIQUET:  none ? ?PATIENT DISPOSITION:  PACU - hemodynamically stable. ?  ?Delay start of Pharmacological VTE agent (>24hrs) due to surgical blood loss or risk of bleeding: no ? ?INDICATION FOR PROCEDURE: [name] is a 71 y.o. female with possible temporal arteritis. After careful discussion of risks, benefits, and alternatives the patient was offered temporal artery biopsy. The patient understood and wished to proceed. ? ?OPERATIVE FINDINGS: unremarkable temporal artery biopsy. >3cm taken for pathologic evaluation. ? ?DESCRIPTION OF PROCEDURE: After identification of the patient in the pre-operative holding area, the patient was transferred to the operating room. The patient was positioned supine on the operating room table. Anesthesia was induced. The left temple was prepped and draped in standard fashion. A surgical pause was performed confirming correct patient, procedure, and operative location. ? ?Using intraoperative ultrasound the course of the left temporal artery was mapped on the skin. An incision was made overlaying the temporal artery. The incision was carried down to the temporal artery using electrocautery. The artery was exposed. The artery was ligated proximally and distally with 3-O silk suture. The artery was excised and passed off the table for pathologic evaluation. Hemostasis was achieved in the surgical bed. The wound was closed in layers using 3-O vicryl and 4-O monocryl. Dermabond was  applied. ? ?Upon completion of the case instrument and sharps counts were confirmed correct. The patient was transferred to the PACU in good condition. I was present for all portions of the procedure. ? ?Yevonne Aline. Stanford Breed, MD ?[facility] [place] ?Office Phone Number: [phone] ?03/17/2022 3:28 PM ? ?  ?

## 2022-03-17 NOTE — Anesthesia Postprocedure Evaluation (Signed)
Anesthesia Post Note ? ?Patient: ADDELYN ALLEMAN ? ?Procedure(s) Performed: BIOPSY TEMPORAL ARTERY (Left) ? ?  ? ?Patient location during evaluation: PACU ?Anesthesia Type: MAC ?Level of consciousness: awake and alert ?Pain management: pain level controlled ?Vital Signs Assessment: post-procedure vital signs reviewed and stable ?Respiratory status: spontaneous breathing, nonlabored ventilation, respiratory function stable and patient connected to nasal cannula oxygen ?Cardiovascular status: stable and blood pressure returned to baseline ?Postop Assessment: no apparent nausea or vomiting ?Anesthetic complications: no ? ? ?No notable events documented. ? ?Last Vitals:  ?Vitals:  ? 03/17/22 1457 03/17/22 1535  ?BP: (!) 194/82 (!) 192/84  ?Pulse: 65   ?Resp: 17   ?Temp: 36.6 ?C   ?SpO2:    ?  ?Last Pain:  ?Vitals:  ? 03/17/22 1457  ?TempSrc: Oral  ?PainSc: 2   ? ? ?  ?  ?  ?  ?  ?  ? ?Effie Berkshire ? ? ? ? ?

## 2022-03-17 NOTE — TOC Progression Note (Signed)
Transition of Care (TOC) - Progression Note  ? ? ?Patient Details  ?Name: Alicia [name] ?MRN: [id number] ?Date of Birth: [birth year] ? ?Transition of Care (TOC) CM/SW Contact  ?Zenon Mayo, RN ?Phone Number: ?03/17/2022, 5:25 PM ? ?Clinical Narrative:    ?from home alone, bx of temporal artery, conts on iv lasix bid. Came in with dizzines and HA and fell at home, fluid in LE's. Has hx of chf.   TOC will continue to follow for dc needs. ? ? ?  ?  ? ?Expected Discharge Plan and Services ?  ?  ?  ?  ?  ?                ?  ?  ?  ?  ?  ?  ?  ?  ?  ?  ? ? ?Social Determinants of Health (SDOH) Interventions ?  ? ?Readmission Risk Interventions ? ?  03/02/2020  ?  1:08 PM  ?Readmission Risk Prevention Plan  ?Transportation Screening Complete  ?PCP or Specialist Appt within 5-7 Days Complete  ?Home Care Screening Complete  ?Medication Review (RN CM) Complete  ? ? ?

## 2022-03-17 NOTE — Progress Notes (Signed)
Physical Therapy Treatment ?Patient Details ?Name: Alicia [name] ?MRN: [id number] ?DOB: May 08, 1951 ?Today's Date: 03/17/2022 ? ? ?History of Present Illness Pt is a 71 y.o. female who presented on 4/24 for temporal artery biopsy but surgery cancelled due to fall, generalized weakness, hyperglycemia, and persistent hypertension. PMH: chronic diastolic heart failure, hyperlipidemia, hypertension, peripheral vascular disease, chronic respiratory failure on 2L supplemental oxygen, previous CVA, type II diabetes mellitus, class III obesity, chronic renal insufficiency. ? ?  ?PT Comments  ? ? The pt presents with slight anxiety regarding planned surgical intervention today, she initially declined all mobility, but was agreeable to limited session to transition to sitting EOB and completing self-care tasks. The pt demos good balance and was able to reach outside BOS without instability or assist needed. SpO2 initially stable on RA, but progressively lowered over 5 min sitting EOB to low of 87%, so pt placed back on 1L O2 which maintained SpO2 > 93%. Will continue to benefit from skilled PT to progress endurance, strength, and dynamic stability, given pt from home alone and with limited mobility, continue to recommend SNF rehab at d/c. Will continue to follow and re-assess after planned surgical intervention today.  ?   ?Recommendations for follow up therapy are one component of a multi-disciplinary discharge planning process, led by the attending physician.  Recommendations may be updated based on patient status, additional functional criteria and insurance authorization. ? ?Follow Up Recommendations ? Skilled nursing-short term rehab (<3 hours/day) ?  ?  ?Assistance Recommended at Discharge Intermittent Supervision/Assistance  ?Patient can return home with the following A little help with bathing/dressing/bathroom;Assistance with cooking/housework;Assist for transportation;Help with stairs or ramp for entrance;A  little help with walking and/or transfers ?  ?Equipment Recommendations ? None recommended by PT  ?  ?Recommendations for Other Services   ? ? ?  ?Precautions / Restrictions Precautions ?Precautions: Fall ?Precaution Comments: monitor SpO2 and BP ?Restrictions ?Weight Bearing Restrictions: No  ?  ? ?Mobility ? Bed Mobility ?Overal bed mobility: Needs Assistance ?Bed Mobility: Supine to Sit ?  ?  ?Supine to sit: Supervision, HOB elevated ?  ?  ?General bed mobility comments: pt completed transition to sitting EOB without assist, declined further transition due to concern for upcoming surgery and wanting to take it easy for surgey ?  ? ?Transfers ?  ?  ?  ?  ?  ?  ?  ?  ?  ?General transfer comment: pt declined ?  ? ? ? ?  ?Balance Overall balance assessment: Needs assistance ?Sitting-balance support: Feet supported, No upper extremity supported ?Sitting balance-Leahy Scale: Good ?Sitting balance - Comments: pt able to maintain while reaching outside BOS to manage self-care. no assist or instability ?  ?  ?  ?  ?  ?  ?  ?  ?  ?  ?  ?  ?  ?  ?  ?  ? ?  ?Cognition Arousal/Alertness: Awake/alert ?Behavior During Therapy: [facility] for tasks assessed/performed ?Overall Cognitive Status: Within Functional Limits for tasks assessed ?  ?  ?  ?  ?  ?  ?  ?  ?  ?  ?  ?  ?  ?  ?  ?  ?General Comments: pt does not need instruction or cues for simple tasks, pleasant and appropriate conversation ?  ?  ? ?  ?Exercises   ? ?  ?General Comments General comments (skin integrity, edema, etc.): Pt initially sustaining on RA at rest, but dropped to low of 87% on  RA, placed on 1L with SpO2 > 93% at rest ?  ?  ? ?Pertinent Vitals/Pain Pain Assessment ?Pain Assessment: No/denies pain ?Pain Score: 0-No pain ?Pain Intervention(s): Monitored during session  ? ? ? ?PT Goals (current goals can now be found in the care plan section) Acute Rehab PT Goals ?Patient Stated Goal: to improve ?PT Goal Formulation: With patient ?Time For Goal Achievement:  03/29/22 ?Potential to Achieve Goals: Good ?Progress towards PT goals: Progressing toward goals ? ?  ?Frequency ? ? ?   ? ? ? ?  ?PT Plan Current plan remains appropriate  ? ? ?   ?AM-PAC PT "6 Clicks" Mobility   ?Outcome Measure ? Help needed turning from your back to your side while in a flat bed without using bedrails?: A Little ?Help needed moving from lying on your back to sitting on the side of a flat bed without using bedrails?: A Little ?Help needed moving to and from a bed to a chair (including a wheelchair)?: A Little ?Help needed standing up from a chair using your arms (e.g., wheelchair or bedside chair)?: A Little ?Help needed to walk in hospital room?: A Lot ?Help needed climbing 3-5 steps with a railing? : Total ?6 Click Score: 15 ? ?  ?End of Session Equipment Utilized During Treatment: Oxygen ?Activity Tolerance: Other (comment) (pt self-limiting in anticipation of surgery) ?Patient left: in bed;with call bell/phone within reach ?Nurse Communication: Mobility status ?PT Visit Diagnosis: Muscle weakness (generalized) (M62.81);Difficulty in walking, not elsewhere classified (R26.2);Unsteadiness on feet (R26.81);Other abnormalities of gait and mobility (R26.89);History of falling (Z91.81) ?  ? ? ?Time: 3220-2542 ?PT Time Calculation (min) (ACUTE ONLY): 17 min ? ?Charges:  $Therapeutic Activity: 8-22 mins          ?          ? ?West Carbo, PT, DPT  ? ?Acute Rehabilitation Department ?Pager #: [phone] - [phone] ? ? ?[name] ?03/17/2022, 10:43 AM ? ?

## 2022-03-17 NOTE — Progress Notes (Signed)
[facility] [place] ?PROGRESS NOTE ? ?ASSESSMENT / PLAN: ?Alicia [name] is a 71 y.o. female with possible temporal arteritis. Plan left temporal artery biopsy today in Or.  ? ?SUBJECTIVE: ?No complaints. Ready for OR. ? ?OBJECTIVE: ?BP (!) 189/82   Pulse 71   Temp 98.6 ?F (37 ?C) (Oral)   Resp 18   Ht '5\' 7"'$  (1.702 m)   Wt 119.7 kg   SpO2 93%   BMI 41.33 kg/m?  ? ?Intake/Output Summary (Last 24 hours) at 03/17/2022 1252 ?Last data filed at 03/17/2022 1053 ?Gross per 24 hour  ?Intake 578.01 ml  ?Output 1200 ml  ?Net -621.99 ml  ?  ?No distress ?Regular rate and rhythm ?Unlabored breathing. ? ? ?  Latest Ref Rng & Units 03/15/2022  ?  8:34 AM 03/14/2022  ?  8:37 AM 03/14/2022  ?  6:24 AM  ?CBC  ?WBC 4.0 - 10.5 K/uL 6.7   9.8     ?Hemoglobin 12.0 - 15.0 g/dL 12.9   11.7   11.9    ?Hematocrit 36.0 - 46.0 % 40.1   36.5   35.0    ?Platelets 150 - 400 K/uL 235   224     ?  ? ? ?  Latest Ref Rng & Units 03/17/2022  ?  4:11 AM 03/16/2022  ?  2:59 AM 03/15/2022  ?  8:34 AM  ?CMP  ?Glucose 70 - 99 mg/dL 66   77   205    ?BUN 8 - 23 mg/dL 47   45   39    ?Creatinine 0.44 - 1.00 mg/dL 3.08   3.03   2.83    ?Sodium 135 - 145 mmol/L 139   138   140    ?Potassium 3.5 - 5.1 mmol/L 3.5   3.5   3.9    ?Chloride 98 - 111 mmol/L 99   97   100    ?CO2 22 - 32 mmol/L 31   32   31    ?Calcium 8.9 - 10.3 mg/dL 7.7   7.5   7.7    ? ? ?Estimated Creatinine Clearance: 22.4 mL/min (A) (by C-G formula based on SCr of 3.08 mg/dL (H)). ? ? ?Alicia [name]. Stanford Breed, MD ?[facility] [place] ?Office Phone Number: [phone] ?03/17/2022 12:52 PM ? ? ? ?

## 2022-03-17 NOTE — Progress Notes (Signed)
Hypoglycemic Event ? ?CBG: 68 ? ?Treatment: D50 25 mL (12.5 gm) ? ?Symptoms: None ? ?Follow-up CBG: [id number] ? CBG Result:118 ? ?Possible Reasons for Event: Other: patient NPO ? ? ? ?[name] ? ? ?

## 2022-03-17 NOTE — Progress Notes (Signed)
? ? ?HD#1 ?SUBJECTIVE:  ?Patient Summary: Alicia [name] is a 71 y.o. with a pertinent PMH of hypertension, CHF, who presented with weakness and fall and admitted for hypertension, hyperglycemia.  ? ?Overnight Events: NAEO  ? ?  ?Interm History:  Patient sitting up at bedside.  No acute complaints, does states she is hungry. ? ?OBJECTIVE:  ?Vital Signs: ?Vitals:  ? 03/17/22 0427 03/17/22 0752 03/17/22 1153 03/17/22 1204  ?BP: (!) 173/77 (!) 179/61  (!) 189/82  ?Pulse: 73 72 71   ?Resp: '17 17 18   '$ ?Temp: 98.3 ?F (36.8 ?C) 98.2 ?F (36.8 ?C) 98.6 ?F (37 ?C)   ?TempSrc: Oral Oral Oral   ?SpO2: 99% 99% 93%   ?Weight: 119.7 kg     ?Height:      ? ?Supplemental O2:  ?SpO2: 93 % ?O2 Flow Rate (L/min): 2 L/min ? ?Filed Weights  ? 03/15/22 1715 03/16/22 0333 03/17/22 0427  ?Weight: 121 kg 120.7 kg 119.7 kg  ? ? ? ?Intake/Output Summary (Last 24 hours) at 03/17/2022 1349 ?Last data filed at 03/17/2022 1053 ?Gross per 24 hour  ?Intake 401.01 ml  ?Output 1200 ml  ?Net -798.99 ml  ? ?Net IO Since Admission: -1,141.99 mL [03/17/22 1349] ? ?Physical Exam: ?Constitutional: Chronically ill-appearing female sitting up in bed, in no acute distress, appears improved today ?Cardiovascular: regular rate and rhythm, no m/r/g,  edema  lower extremity, no JVD ?Pulmonary/Chest: breathing comfortably on RA, lungs clear to auscultation bilaterally ?Abdominal: soft, non-tender, non-distended ?MSK: normal bulk and tone ?Skin: warm and dry ?Psych: Mood and affect appropriate ? ?Patient Lines/Drains/Airways Status   ? ? Active Line/Drains/Airways   ? ? Name Placement date Placement time Site Days  ? Peripheral IV 03/15/22 20 G 1.88" Right Antecubital 03/15/22  0833  Antecubital  less than 1  ? External Urinary Catheter 03/14/22  2021  --  1  ? ?  ?  ? ?  ? ? ?Pertinent Labs: ? ?  Latest Ref Rng & Units 03/15/2022  ?  8:34 AM 03/14/2022  ?  8:37 AM 03/14/2022  ?  6:24 AM  ?CBC  ?WBC 4.0 - 10.5 K/uL 6.7   9.8     ?Hemoglobin 12.0 - 15.0 g/dL 12.9    11.7   11.9    ?Hematocrit 36.0 - 46.0 % 40.1   36.5   35.0    ?Platelets 150 - 400 K/uL 235   224     ? ? ? ?  Latest Ref Rng & Units 03/17/2022  ?  4:11 AM 03/16/2022  ?  2:59 AM 03/15/2022  ?  8:34 AM  ?CMP  ?Glucose 70 - 99 mg/dL 66   77   205    ?BUN 8 - 23 mg/dL 47   45   39    ?Creatinine 0.44 - 1.00 mg/dL 3.08   3.03   2.83    ?Sodium 135 - 145 mmol/L 139   138   140    ?Potassium 3.5 - 5.1 mmol/L 3.5   3.5   3.9    ?Chloride 98 - 111 mmol/L 99   97   100    ?CO2 22 - 32 mmol/L 31   32   31    ?Calcium 8.9 - 10.3 mg/dL 7.7   7.5   7.7    ? ? ?Recent Labs  ?  03/17/22 ?1051 03/17/22 ?1155 03/17/22 ?1306  ?GLUCAP 124* 125* 124*  ?  ? ?ASSESSMENT/PLAN:  ?Assessment: ?Principal  Problem: ?  Fall ?Active Problems: ?  Severe uncontrolled hypertension ?  Diabetes mellitus type 2 in obese [facility]) ?  GCA (giant cell arteritis) ([place]) ?  Congestive heart failure (CHF) ([place]) ? ?Acute on Chronic HFpEF ?SHe has a history of medication nonadherence.  This is likely the precipitant for her CHF exacerbation in addition to her poorly controlled hypertension. ?- Patient had echo performed yesterday which showed EF 55 to 60% and grade 2 diastolic dysfunction.  No regional wall motion abnormalities. ?-Sitting up bedside, no 9% O2 saturation, blood pressure has been stable.   She had 1.5 L urine output with increased Lasix dose, and renal function has remained stable.  Still has some evidence of volume overload. ?- A.m. dose of Lasix was held prior to temporal artery biopsy.  We will plan to give IM dose now that patient is back from the OR.  Can resume twice daily dosing tomorrow. ?- Lasix to 120 twice daily ?- Strict I's and O's ?- Daily weights ?- OOB when able, encourage ambulation, PT has evaluated the patient recommending SNF ?-Oxygen as needed for exertion ?- Monitor renal function ?  ?Chronic Severe Asymptomatic Hypertension ?Blood pressure difficult to control despite multiple medications.  There is suspicion by primary care  physician for hyperaldosteronism.  Blood pressure has shown better response since starting Aldactone 50 mg daily, however if this remains elevated can consider increasing dose. ?-Patient missed several of her a.m. blood pressure medicines this morning due to surgery.  We will plan to resume medicines as scheduled. ?- Metoprolol 50 mg ?- Amlodipine 10 ?- Aldactone to 50 mg/day. ?-We will continue to monitor this.  May be able to discontinue additional medications if spironolactone proves more effective. ?  ?Diabetes ?- CBG monitoring.  Insulin scale dose was increased yesterday to accommodate for prednisone.  Blood sugar has been better controlled  ?- Sliding scale insulin. ? ?Suspected giant cell arteritis ?Steroid intolerance ?Previously noted elevated inflammatory markers and temporal headache. ?Patient is on high-dose prednisone.  Patient's elevated blood pressure, hyperglycemia, and disequilibrium may be intolerance to this medication.  We will continue her on prednisone for now given risks associated with GCA.  We are working to medically optimize her at this time.  Diuresing for CHF, optimizing blood pressure control, managing diabetes.   ?-Patient underwent temporal artery biopsy with vascular surgery this afternoon.  We will follow-up pathology results.  Appreciate vascular surgery's assistance with this case. ? ?CKD ?BUN/creatinine have remained stable.  Urine output 1.5 L overnight. ?- Monitor with daily BMPs.   ?- Avoid nephrotoxic drugs. ? ?Deconditioning. ?Physical therapy has evaluated the patient, recommending short-term SNF.  Social work has been consulted and FL 2 has been signed. ? ? ?Signature: ?Delene Ruffini, MD  ?Internal Medicine Resident, PGY-1 ?[facility]  ?Pager: [phone] ?1:49 PM, 03/17/2022  ? ?Please contact the on call pager after 5 pm and on weekends at [phone]. ? ? ?

## 2022-03-18 ENCOUNTER — Encounter (HOSPITAL_COMMUNITY): Payer: Self-pay | Admitting: Vascular Surgery

## 2022-03-18 DIAGNOSIS — E1122 Type 2 diabetes mellitus with diabetic chronic kidney disease: Secondary | ICD-10-CM | POA: Diagnosis not present

## 2022-03-18 DIAGNOSIS — I130 Hypertensive heart and chronic kidney disease with heart failure and stage 1 through stage 4 chronic kidney disease, or unspecified chronic kidney disease: Secondary | ICD-10-CM

## 2022-03-18 DIAGNOSIS — I5033 Acute on chronic diastolic (congestive) heart failure: Secondary | ICD-10-CM | POA: Diagnosis not present

## 2022-03-18 DIAGNOSIS — W19XXXA Unspecified fall, initial encounter: Secondary | ICD-10-CM | POA: Diagnosis not present

## 2022-03-18 DIAGNOSIS — N189 Chronic kidney disease, unspecified: Secondary | ICD-10-CM

## 2022-03-18 LAB — GLUCOSE, CAPILLARY
Glucose-Capillary: 118 mg/dL — ABNORMAL HIGH (ref 70–99)
Glucose-Capillary: 139 mg/dL — ABNORMAL HIGH (ref 70–99)
Glucose-Capillary: 143 mg/dL — ABNORMAL HIGH (ref 70–99)
Glucose-Capillary: 284 mg/dL — ABNORMAL HIGH (ref 70–99)
Glucose-Capillary: 372 mg/dL — ABNORMAL HIGH (ref 70–99)
Glucose-Capillary: 380 mg/dL — ABNORMAL HIGH (ref 70–99)
Glucose-Capillary: 64 mg/dL — ABNORMAL LOW (ref 70–99)

## 2022-03-18 LAB — MAGNESIUM: Magnesium: 1.9 mg/dL (ref 1.7–2.4)

## 2022-03-18 LAB — BASIC METABOLIC PANEL
Anion gap: 8 (ref 5–15)
BUN: 52 mg/dL — ABNORMAL HIGH (ref 8–23)
CO2: 30 mmol/L (ref 22–32)
Calcium: 7.3 mg/dL — ABNORMAL LOW (ref 8.9–10.3)
Chloride: 99 mmol/L (ref 98–111)
Creatinine, Ser: 3.19 mg/dL — ABNORMAL HIGH (ref 0.44–1.00)
GFR, Estimated: 15 mL/min — ABNORMAL LOW (ref >60)
Glucose, Bld: 220 mg/dL — ABNORMAL HIGH (ref 70–99)
Potassium: 3.6 mmol/L (ref 3.5–5.1)
Sodium: 137 mmol/L (ref 135–145)

## 2022-03-18 LAB — PHOSPHORUS: Phosphorus: 4.7 mg/dL — ABNORMAL HIGH (ref 2.5–4.6)

## 2022-03-18 LAB — SURGICAL PATHOLOGY

## 2022-03-18 MED ORDER — FUROSEMIDE 40 MG PO TABS
80.0000 mg | ORAL_TABLET | Freq: Two times a day (BID) | ORAL | Status: DC
Start: 2022-03-18 — End: 2022-03-19
  Administered 2022-03-18 – 2022-03-19 (×3): 80 mg via ORAL
  Filled 2022-03-18 (×3): qty 2

## 2022-03-18 MED ORDER — PREDNISONE 20 MG PO TABS: 30.0000 mg | ORAL_TABLET | Freq: Every day | ORAL | Status: DC

## 2022-03-18 MED ORDER — INSULIN ASPART 100 UNIT/ML IJ SOLN: 0.0000 [IU] | INTRAMUSCULAR | Status: DC

## 2022-03-18 NOTE — Plan of Care (Signed)
  Problem: Cardiac: Goal: Ability to achieve and maintain adequate cardiopulmonary perfusion will improve Outcome: Progressing   

## 2022-03-18 NOTE — TOC Progression Note (Signed)
Transition of Care (TOC) - Progression Note  ? ? ?Patient Details  ?Name: Alicia [name] ?MRN: [id number] ?Date of Birth: 1951-08-24 ? ?Transition of Care (TOC) CM/SW Contact  ?Esvin Hnat Renold Don, LCSWA ?Phone Number: ?03/18/2022, 3:51 PM ? ?Clinical Narrative:    ?CSW went over SNF options with pt, pt has decided to go to [facility] since it is in her area code and they have a on site notary. Pt Josem Kaufmann will be started by [facility] today. CSW will keep in contact with [facility] on auth status.  ? ? ?Expected Discharge Plan: [place] ?Barriers to Discharge: Continued Medical Work up ? ?Expected Discharge Plan and Services ?Expected Discharge Plan: [place] ?In-house Referral: Clinical Social Work ?  ?Post Acute Care Choice: [place] ?Living arrangements for the past 2 months: [place] ?                ?  ?  ?  ?  ?  ?  ?  ?  ?  ?  ? ? ?Social Determinants of Health (SDOH) Interventions ?  ? ?Readmission Risk Interventions ? ?  03/02/2020  ?  1:08 PM  ?Readmission Risk Prevention Plan  ?Transportation Screening Complete  ?PCP or Specialist Appt within 5-7 Days Complete  ?Home Care Screening Complete  ?Medication Review (RN CM) Complete  ? ? ?

## 2022-03-18 NOTE — TOC Initial Note (Signed)
Transition of Care (TOC) - Initial/Assessment Note  ? ? ?Patient Details  ?Name: Alicia [name] ?MRN: [id number] ?Date of Birth: Oct 06, 1951 ? ?Transition of Care (TOC) CM/SW Contact:    ?Reece Agar, LCSWA ?Phone Number: ?03/18/2022, 1:06 PM ? ?Clinical Narrative:                 ?CSW received SNF consult. CSW met with pt at bedside. CSW introduced self and explained role at the hospital. Pt reports that PTA the pt lived at home alone, with no support.  ? ?CSW reviewed PT/OT recommendations for SNF. Pt reports being unsure about  going to a SNF but will let CSW know after reviewing offers. Pt gave CSW permission to fax out to facilities in the area. Pt has no preference of facility at this time. CSW gave pt medicare.gov rating list to review.  ? ?CSW will continue to follow. ? ?Expected Discharge Plan: [place] ?Barriers to Discharge: Continued Medical Work up ? ? ?Patient Goals and CMS Choice ?Patient states their goals for this hospitalization and ongoing recovery are:: Rehab at [facility] or Home ?CMS Medicare.gov Compare Post Acute Care list provided to:: Patient ?Choice offered to / list presented to : Patient ? ?Expected Discharge Plan and Services ?Expected Discharge Plan: [place] ?In-house Referral: Clinical Social Work ?  ?Post Acute Care Choice: [place] ?Living arrangements for the past 2 months: [place] ?                ?  ?  ?  ?  ?  ?  ?  ?  ?  ?  ? ?Prior Living Arrangements/Services ?Living arrangements for the past 2 months: [place] ?Lives with:: Self ?Patient language and need for interpreter reviewed:: Yes ?Do you feel safe going back to the place where you live?: Yes      ?Need for Family Participation in Patient Care: Yes (Comment) ?Care giver support system in place?: Yes (comment) ?  ?Criminal Activity/Legal Involvement Pertinent to Current Situation/Hospitalization: No - Comment as needed ? ?Activities of Daily  Living ?Home Assistive Devices/Equipment: Walker (specify type), CBG Meter, Eyeglasses, Cane (specify quad or straight), Dentures (specify type) ?ADL Screening (condition at time of admission) ?Patient's cognitive ability adequate to safely complete daily activities?: Yes ?Is the patient deaf or have difficulty hearing?: No ?Does the patient have difficulty seeing, even when wearing glasses/contacts?: No ?Does the patient have difficulty concentrating, remembering, or making decisions?: Yes ?Patient able to express need for assistance with ADLs?: Yes ?Does the patient have difficulty dressing or bathing?: Yes ?Independently performs ADLs?: Yes (appropriate for developmental age) ?Does the patient have difficulty walking or climbing stairs?: Yes ?Weakness of Legs: Both ?Weakness of Arms/Hands: Both ? ?Permission Sought/Granted ?Permission sought to share information with : Family Supports ?Permission granted to share information with : Yes, Verbal Permission Granted ?   ?   ?   ?   ? ?Emotional Assessment ?Appearance:: Appears stated age ?Attitude/Demeanor/Rapport: Engaged ?Affect (typically observed): Appropriate ?Orientation: : Oriented to Self, Oriented to Place, Oriented to  Time, Oriented to Situation ?Alcohol / Substance Use: Not Applicable ?Psych Involvement: No (comment) ? ?Admission diagnosis:  Fall [W19.XXXA] ?Hyperglycemia [R73.9] ?Generalized weakness [R53.1] ?Hypertension, unspecified type [I10] ?Congestive heart failure (CHF) ([place]) [I50.9] ?Patient Active Problem List  ? Diagnosis Date Noted  ? Congestive heart failure (CHF) ([place]) 03/16/2022  ? GCA (giant cell arteritis) ([place]) 03/15/2022  ? Fall 03/14/2022  ? Headache 02/16/2022  ?  Disequilibrium 12/01/2021  ? (HFpEF) heart failure with preserved ejection fraction ([place]) 07/18/2021  ? Exacerbation of RAD (reactive airway disease) 02/27/2020  ? CKD (chronic kidney disease) stage 4, GFR 15-29 ml/min (HCC) 02/27/2020  ? Tobacco use 02/27/2020  ? Prolonged  Q-T interval on ECG 04/27/2018  ? Diabetes mellitus type 2 in obese ([place]) 03/16/2018  ? Severe obesity (BMI >= 40) (H[facility])   ? CVA (cerebral vascular accident) ([place]) 03/15/2018  ? Peripheral vascular disease, unspecified ([place]) 11/30/2012  ? Hyperlipidemia 07/20/2007  ? Severe uncontrolled hypertension 07/20/2007  ? ?PCP:  Rick Duff, MD ?Pharmacy:   ?[facility] [place] [facility] [address] ?[place] [address] ?Phone: [phone] Fax: [phone] ? ?[facility] ?[address] ?[place] [address] ?Phone: [phone] Fax: [phone] ? ?[facility] ?[address] ?[place] [address] ?Phone: [phone] Fax: [phone] ? ? ? ? ?Social Determinants of Health (SDOH) Interventions ?  ? ?Readmission Risk Interventions ? ?  03/02/2020  ?  1:08 PM  ?Readmission Risk Prevention Plan  ?Transportation Screening Complete  ?PCP or Specialist Appt within 5-7 Days Complete  ?Home Care Screening Complete  ?Medication Review (RN CM) Complete  ? ? ? ?

## 2022-03-18 NOTE — Progress Notes (Signed)
Occupational Therapy Treatment ?Patient Details ?Name: Alicia [name] ?MRN: [id number] ?DOB: November 12, 1951 ?Today's Date: 03/18/2022 ? ? ?History of present illness Pt is a 71 y.o. female who presented on 4/24 for temporal artery biopsy but surgery cancelled due to fall, generalized weakness, hyperglycemia, and persistent hypertension. S/P temporal artery biopsy 4/27. PMH: chronic diastolic heart failure, hyperlipidemia, hypertension, peripheral vascular disease, chronic respiratory failure on 2L supplemental oxygen, previous CVA, type II diabetes mellitus, class III obesity, chronic renal insufficiency. ?  ?OT comments ? Pt supine in bed and agreeable to OT session.  Reports fatigued from biopsy yesterday, but wanting to get OOB.  Patient completing bed mobility with supervision, LB dressing with mod assist and transfers/short distance mobility in room using RW with min guard. Reports lightheaded during session, but VSS on 2L [place]- overall reports feeling much better since admission. Will follow acutely.    ? ?Recommendations for follow up therapy are one component of a multi-disciplinary discharge planning process, led by the attending physician.  Recommendations may be updated based on patient status, additional functional criteria and insurance authorization. ?   ?Follow Up Recommendations ? Skilled nursing-short term rehab (<3 hours/day)  ?  ?Assistance Recommended at Discharge Intermittent Supervision/Assistance  ?Patient can return home with the following ? A little help with walking and/or transfers;A little help with bathing/dressing/bathroom;Assistance with cooking/housework;Assist for transportation;Help with stairs or ramp for entrance ?  ?Equipment Recommendations ? Other (comment) (defer)  ?  ?Recommendations for Other Services   ? ?  ?Precautions / Restrictions Precautions ?Precautions: Fall ?Precaution Comments: monitor SpO2 and BP ?Restrictions ?Weight Bearing Restrictions: No  ? ? ?  ? ?Mobility Bed  Mobility ?Overal bed mobility: Needs Assistance ?Bed Mobility: Supine to Sit ?  ?  ?Supine to sit: Supervision, HOB elevated ?  ?  ?General bed mobility comments: supervision for line mgmt ?  ? ?Transfers ?  ?  ?  ?  ?  ?  ?  ?  ?  ?  ?  ?  ?Balance Overall balance assessment: Needs assistance ?Sitting-balance support: Feet supported, No upper extremity supported ?Sitting balance-Leahy Scale: Good ?  ?  ?Standing balance support: Bilateral upper extremity supported, During functional activity ?Standing balance-Leahy Scale: Poor ?Standing balance comment: relies on RW ?  ?  ?  ?  ?  ?  ?  ?  ?  ?  ?  ?   ? ?ADL either performed or assessed with clinical judgement  ? ?ADL Overall ADL's : Needs assistance/impaired ?  ?  ?  ?  ?  ?  ?  ?  ?Upper Body Dressing : Set up;Sitting ?  ?Lower Body Dressing: Moderate assistance;Sit to/from stand ?Lower Body Dressing Details (indicate cue type and reason): wound benefit from AE education, able to pull up socks but not don.  Min guard sit to stand ?Toilet Transfer: Min guard;Ambulation;Rolling walker (2 wheels) ?Toilet Transfer Details (indicate cue type and reason): simulated to recliner, using RW ?  ?  ?  ?  ?Functional mobility during ADLs: Min guard;Rolling walker (2 wheels) ?  ?  ? ?Extremity/Trunk Assessment   ?  ?  ?  ?  ?  ? ?Vision   ?  ?  ?Perception   ?  ?Praxis   ?  ? ?Cognition Arousal/Alertness: Awake/alert ?Behavior During Therapy: [facility] for tasks assessed/performed ?Overall Cognitive Status: Within Functional Limits for tasks assessed ?  ?  ?  ?  ?  ?  ?  ?  ?  ?  ?  ?  ?  ?  ?  ?  ?  ?  ?  ?   ?  Exercises   ? ?  ?Shoulder Instructions   ? ? ?  ?General Comments VSS on O2 at 2L, although reports feeling light headed during session  ? ? ?Pertinent Vitals/ Pain       Pain Assessment ?Pain Assessment: Faces ?Faces Pain Scale: Hurts a little bit ?Pain Location: head from biopsy ?Pain Descriptors / Indicators: Discomfort ?Pain Intervention(s): Limited activity within  patient's tolerance, Premedicated before session, Repositioned ? ?Home Living   ?  ?  ?  ?  ?  ?  ?  ?  ?  ?  ?  ?  ?  ?  ?  ?  ?  ?  ? ?  ?Prior Functioning/Environment    ?  ?  ?  ?   ? ?Frequency ? Min 2X/week  ? ? ? ? ?  ?Progress Toward Goals ? ?OT Goals(current goals can now be found in the care plan section) ? Progress towards OT goals: Progressing toward goals ? ?Acute Rehab OT Goals ?Patient Stated Goal: get better ?OT Goal Formulation: With patient ?Time For Goal Achievement: 03/29/22 ?Potential to Achieve Goals: Good  ?Plan Discharge plan remains appropriate;Frequency remains appropriate   ? ?Co-evaluation ? ? ?   ?  ?  ?  ?  ? ?  ?AM-PAC OT "6 Clicks" Daily Activity     ?Outcome Measure ? ? Help from another person eating meals?: A Little ?Help from another person taking care of personal grooming?: A Little ?Help from another person toileting, which includes using toliet, bedpan, or urinal?: A Lot ?Help from another person bathing (including washing, rinsing, drying)?: A Little ?Help from another person to put on and taking off regular upper body clothing?: A Little ?Help from another person to put on and taking off regular lower body clothing?: A Lot ?6 Click Score: 16 ? ?  ?End of Session Equipment Utilized During Treatment: Rolling walker (2 wheels);Oxygen ? ?OT Visit Diagnosis: Other abnormalities of gait and mobility (R26.89);Muscle weakness (generalized) (M62.81);History of falling (Z91.81) ?  ?Activity Tolerance Patient tolerated treatment well ?  ?Patient Left in chair;with call bell/phone within reach;with chair alarm set ?  ?Nurse Communication Mobility status ?  ? ?   ? ?Time: 1202-1216 ?OT Time Calculation (min): 14 min ? ?Charges: OT General Charges ?$OT Visit: 1 Visit ?OT Treatments ?$Self Care/Home Management : 8-22 mins ? ?Jolaine Artist, OT ?Acute Rehabilitation Services ?Pager [phone] ?Office [phone] ? ? ?Delight Stare ?03/18/2022, 12:35 PM ?

## 2022-03-18 NOTE — Progress Notes (Signed)
CBG 64, graham crackers and PB and OJ, MD aware, will continue to monitor, Thanks Arvella Nigh RN  ?

## 2022-03-18 NOTE — Consult Note (Signed)
? ?  [facility] CM Inpatient Consult ? ? ?03/18/2022 ? ?Arta Bruce ?[birth year] ?[id number] ? ?[place] Organization [ACO] Patient: [name] ? ?Primary Care Provider:  Rick Duff, MD [MS] is listed at [facility],  is an embedded provider with a Chronic Care Management team and program, and is listed for the transition of care follow up and appointments. ? ?Patient was screened for Embedded practice service needs for chronic care management and is currently active with the Embedded RNCM for CCM. ? ?Came by to speak with patient at the bedside patient was sound asleep, did not awaken.. Left patient an appointment reminder card and a 24 hour nurse advise line magnet on the bedside table. Was following up for transportation SDOH review. ? ?Plan: Will continue to follow transition.  A notification is sent to the [place] Management and made aware of TOC needs for post hospital needs. ? ?Please contact for further questions, ? ?Natividad Brood, RN BSN CCM ?[facility] Liaison ? [phone] business mobile phone ?Toll free office [phone]  ?Fax number: [phone] ?[email or web address] ?[email or web address] ? ? ? ?

## 2022-03-18 NOTE — Progress Notes (Signed)
CBG back to 118 after having snack, MD aware, will continue to monitor, Thanks Arvella Nigh  ?

## 2022-03-18 NOTE — Progress Notes (Signed)
?   03/17/22 2020  ?Assess: MEWS Score  ?Temp 98.2 ?F (36.8 ?C)  ?BP (!) 206/74 ?(pt just got up in the bed)  ?Pulse Rate 78  ?Resp 20  ?SpO2 98 %  ?O2 Device Nasal Cannula  ?O2 Flow Rate (L/min) 2 L/min  ?Assess: MEWS Score  ?MEWS Temp 0  ?MEWS Systolic 2  ?MEWS Pulse 0  ?MEWS RR 0  ?MEWS LOC 0  ?MEWS Score 2  ?MEWS Score Color Yellow  ?Assess: if the MEWS score is Yellow or Red  ?Were vital signs taken at a resting state? No  ?Focused Assessment No change from prior assessment  ?Early Detection of Sepsis Score *See Row Information* Low  ?MEWS guidelines implemented *See Row Information* No, other (Comment) ?(BP have been high since admission, MD aware)  ?Document  ?Patient Outcome Stabilized after interventions  ?Progress note created (see row info) Yes  ? ? ?

## 2022-03-18 NOTE — Progress Notes (Signed)
? ? ?HD#2 ?SUBJECTIVE:  ?Patient Summary: Alicia [name] is a 71 y.o. with a pertinent PMH of hypertension, CHF, who presented with weakness and fall and admitted for hypertension, hyperglycemia.  ? ?Overnight Events: NAEO  ? ?  ?Interm History: Patient seen and evaluated bedside.  She states she is doing well.  Breathing well pain controlled. ? ?OBJECTIVE:  ?Vital Signs: ?Vitals:  ? 03/17/22 2243 03/18/22 0600 03/18/22 0941 03/18/22 1118  ?BP: (!) 184/76 (!) 161/80 (!) 158/78 (!) 188/73  ?Pulse: 68 76 71 72  ?Resp:  '18 18 20  '$ ?Temp:  98.6 ?F (37 ?C) 98.5 ?F (36.9 ?C) 97.9 ?F (36.6 ?C)  ?TempSrc:  Oral Oral Oral  ?SpO2:  97% 99% 97%  ?Weight:  121.3 kg    ?Height:      ? ?Supplemental O2:  ?SpO2: 97 % ?O2 Flow Rate (L/min): 2 L/min ? ?Filed Weights  ? 03/16/22 0333 03/17/22 0427 03/18/22 0600  ?Weight: 120.7 kg 119.7 kg 121.3 kg  ? ? ? ?Intake/Output Summary (Last 24 hours) at 03/18/2022 1423 ?Last data filed at 03/18/2022 1246 ?Gross per 24 hour  ?Intake 836.18 ml  ?Output 700 ml  ?Net 136.18 ml  ? ? ?Net IO Since Admission: -755.81 mL [03/18/22 1423] ? ?Physical Exam: ?Constitutional: Chronically ill-appearing female sitting up in bed, in no acute distress, appears improved today ?Cardiovascular: regular rate and rhythm, no m/r/g,  edema left lower extremity 2+, no pitting edema right leg, no JVD ?Pulmonary/Chest: breathing comfortably on RA, lungs clear to auscultation bilaterally ?Abdominal: soft, non-tender, non-distended ?MSK: normal bulk and tone ?Skin: warm and dry ?Psych: Mood and affect appropriate ? ?Patient Lines/Drains/Airways Status   ? ? Active Line/Drains/Airways   ? ? Name Placement date Placement time Site Days  ? Peripheral IV 03/15/22 20 G 1.88" Right Antecubital 03/15/22  0833  Antecubital  less than 1  ? External Urinary Catheter 03/14/22  2021  --  1  ? ?  ?  ? ?  ? ? ?Pertinent Labs: ? ?  Latest Ref Rng & Units 03/15/2022  ?  8:34 AM 03/14/2022  ?  8:37 AM 03/14/2022  ?  6:24 AM  ?CBC  ?WBC  4.0 - 10.5 K/uL 6.7   9.8     ?Hemoglobin 12.0 - 15.0 g/dL 12.9   11.7   11.9    ?Hematocrit 36.0 - 46.0 % 40.1   36.5   35.0    ?Platelets 150 - 400 K/uL 235   224     ? ? ? ?  Latest Ref Rng & Units 03/18/2022  ?  1:32 AM 03/17/2022  ?  4:11 AM 03/16/2022  ?  2:59 AM  ?CMP  ?Glucose 70 - 99 mg/dL 220   66   77    ?BUN 8 - 23 mg/dL 52   47   45    ?Creatinine 0.44 - 1.00 mg/dL 3.19   3.08   3.03    ?Sodium 135 - 145 mmol/L 137   139   138    ?Potassium 3.5 - 5.1 mmol/L 3.6   3.5   3.5    ?Chloride 98 - 111 mmol/L 99   99   97    ?CO2 22 - 32 mmol/L 30   31   32    ?Calcium 8.9 - 10.3 mg/dL 7.3   7.7   7.5    ? ? ?Recent Labs  ?  03/18/22 ?0602 03/18/22 ?0634 03/18/22 ?1121  ?GLUCAP 64*  118* 143*  ? ?  ? ?ASSESSMENT/PLAN:  ?Assessment: ?Principal Problem: ?  Fall ?Active Problems: ?  Severe uncontrolled hypertension ?  Diabetes mellitus type 2 in obese [facility]) ?  GCA (giant cell arteritis) ([place]) ?  Congestive heart failure (CHF) ([place]) ? ?Acute on Chronic HFpEF ?SHe has a history of medication nonadherence.  This is likely the precipitant for her CHF exacerbation in addition to her poorly controlled hypertension. ?- Patient had echo performed yesterday which showed EF 55 to 60% and grade 2 diastolic dysfunction.  No regional wall motion abnormalities. ?-Appears comfortable, saturating well 98% on 2 L.  Blood pressure has been stable.  She put out a little less than 1 L urine output yesterday.  Renal function has shown increase in creatinine today up to 3.19.  She is likely euvolemic now.  Does still have some 2+ pitting edema left lower leg, however this is likely venous insufficiency.  We have discontinued her IV Lasix and restarted home Lasix dose 80 mg oral twice daily.  We will also get compression stockings for lower extremities and encourage ambulation.  Overall she appears much improved and is approaching readiness for discharge. ?- Lasix 80 mg twice daily oral ?- Compression stockings lower extremities. ?- Strict  I's and O's ?- Daily weights ?- OOB when able, encourage ambulation, PT has evaluated the patient recommending SNF ?-Oxygen as needed for exertion ?- Monitor renal function ?  ?Chronic Severe Asymptomatic Hypertension ?Blood pressure difficult to control despite multiple medications.  There is suspicion by primary care physician for hyperaldosteronism.  Blood pressure is stable currently.  If this remains elevated and renal function shows some improvement tomorrow can consider increasing Aldactone to 100. ?- Metoprolol 50 mg ?- Amlodipine 10 ?- Aldactone to 50 mg/day. ?-We will continue to monitor this.  May be able to discontinue additional medications if spironolactone proves more effective. ?  ?Diabetes ?-Noted to have episode of hypoglycemia up to 462 overnight.  Likely stress reaction due to recent surgery.  Patient has been continued on sliding scale insulin and sugar has normalized today now in the 100s. ?- Sliding scale insulin. ? ?Suspected giant cell arteritis ?Steroid intolerance ?Previously noted elevated inflammatory markers and temporal headache. ?Patient is on high-dose prednisone.  Patient's elevated blood pressure, hyperglycemia, and disequilibrium may be intolerance to this medication.  We will continue her on prednisone for now given risks associated with GCA.    ?-Patient underwent temporal artery biopsy with vascular surgery yesterday.  She is doing well after procedure with well-controlled pain.  Surgical site appears to be doing well. ?-We will follow-up pathology results.  ? ?CKD ?Slight increase in creatinine, patient likely euvolemic at this point.  We have discontinued IV Lasix and will continue to monitor renal function. ?- Monitor with daily BMPs.   ?- Avoid nephrotoxic drugs. ? ?Deconditioning. ?Physical therapy has evaluated the patient, recommending short-term SNF.  Social work has been consulted and FL 2 has been signed. ? ? ?Signature: ?Delene Ruffini, MD  ?Internal Medicine  Resident, PGY-1 ?[facility]  ?Pager: [phone] ?2:23 PM, 03/18/2022  ? ?Please contact the on call pager after 5 pm and on weekends at [phone]. ? ? ?

## 2022-03-18 NOTE — Progress Notes (Signed)
Physical Therapy Treatment ?Patient Details ?Name: Alicia [name] ?MRN: [id number] ?DOB: 08/11/1951 ?Today's Date: 03/18/2022 ? ? ?History of Present Illness Pt is a 71 y.o. female who presented on 4/24 for temporal artery biopsy but surgery cancelled due to fall, generalized weakness, hyperglycemia, and persistent hypertension. S/P temporal artery biopsy 4/27. PMH: chronic diastolic heart failure, hyperlipidemia, hypertension, peripheral vascular disease, chronic respiratory failure on 2L supplemental oxygen, previous CVA, type II diabetes mellitus, class III obesity, chronic renal insufficiency. ? ?  ?PT Comments  ? ? The pt was able to demo good progress with gait, completing 2 bouts of ~15 ft using RW for stability. She is limited by fatigue and onset of dizziness with continued exertion that onset sooner in second bout of ambulation. The pt reports slight recovery with seated rest. SpO2 to low of 87% on 1L with gait, stable with low of 92% on 2L O2. The pt continues to be appropriated for SNF rehab at d/c due to poor endurance and assist needed for safety. The pt is hopeful to progress stability and endurance to facilitate return home alone after short stay at [name].  ?   ?Recommendations for follow up therapy are one component of a multi-disciplinary discharge planning process, led by the attending physician.  Recommendations may be updated based on patient status, additional functional criteria and insurance authorization. ? ?Follow Up Recommendations ? Skilled nursing-short term rehab (<3 hours/day) ?  ?  ?Assistance Recommended at Discharge Intermittent Supervision/Assistance  ?Patient can return home with the following A little help with bathing/dressing/bathroom;Assistance with cooking/housework;Assist for transportation;Help with stairs or ramp for entrance;A little help with walking and/or transfers ?  ?Equipment Recommendations ? None recommended by PT  ?  ?Recommendations for Other Services   ? ? ?   ?Precautions / Restrictions Precautions ?Precautions: Fall ?Precaution Comments: monitor SpO2 and BP, on 2L O2 during session ?Restrictions ?Weight Bearing Restrictions: No  ?  ? ?Mobility ? Bed Mobility ?Overal bed mobility: Needs Assistance ?Bed Mobility: Supine to Sit ?  ?  ?Supine to sit: Supervision, HOB elevated ?  ?  ?General bed mobility comments: supervision for line mgmt ?  ? ?Transfers ?Overall transfer level: Needs assistance ?Equipment used: Rolling walker (2 wheels) ?Transfers: Sit to/from Stand ?Sit to Stand: Supervision, From elevated surface ?  ?  ?  ?  ?  ?General transfer comment: bed elevated to mimic the pt's bed at home, supervision to complete sit-stand without UE support. ?  ? ?Ambulation/Gait ?Ambulation/Gait assistance: Min guard ?Gait Distance (Feet): 15 Feet (+ 15 ft) ?Assistive device: Rolling walker (2 wheels) ?Gait Pattern/deviations: Step-through pattern, Decreased stride length ?Gait velocity: reduced ?Gait velocity interpretation: <1.31 ft/sec, indicative of household ambulator ?  ?General Gait Details: slow, small steps with heavy reliance on BUE support. no overt LOB, pt reports more dizziness on second bout of ambulation than first. BP stable and Spo2 stable in 90s on 2L O2 ? ? ?  ?Balance Overall balance assessment: Needs assistance ?Sitting-balance support: Feet supported, No upper extremity supported ?Sitting balance-Leahy Scale: Good ?  ?  ?Standing balance support: Bilateral upper extremity supported, During functional activity ?Standing balance-Leahy Scale: Poor ?Standing balance comment: relies on RW ?  ?  ?  ?  ?  ?  ?  ?  ?  ?  ?  ?  ? ?  ?Cognition Arousal/Alertness: Awake/alert ?Behavior During Therapy: [name] for tasks assessed/performed ?Overall Cognitive Status: Within Functional Limits for tasks assessed ?  ?  ?  ?  ?  ?  ?  ?  ?  ?  ?  ?  ?  ?  ?  ?  ?  ?  ?  ? ?  ?  Exercises   ? ?  ?General Comments General comments (skin integrity, edema, etc.): VSS on 2L for gait,  low of 87% on 1L. ?  ?  ? ?Pertinent Vitals/Pain Pain Assessment ?Pain Assessment: Faces ?Faces Pain Scale: Hurts a little bit ?Pain Location: head from biopsy ?Pain Descriptors / Indicators: Discomfort ?Pain Intervention(s): Monitored during session, Repositioned  ? ? ? ?PT Goals (current goals can now be found in the care plan section) Acute Rehab PT Goals ?Patient Stated Goal: to return to independence and go home ?PT Goal Formulation: With patient ?Time For Goal Achievement: 03/29/22 ?Potential to Achieve Goals: Good ?Progress towards PT goals: Progressing toward goals ? ?  ?Frequency ? ? ? Min 3X/week ? ? ? ?  ?PT Plan Current plan remains appropriate  ? ? ?   ?AM-PAC PT "6 Clicks" Mobility   ?Outcome Measure ? Help needed turning from your back to your side while in a flat bed without using bedrails?: A Little ?Help needed moving from lying on your back to sitting on the side of a flat bed without using bedrails?: A Little ?Help needed moving to and from a bed to a chair (including a wheelchair)?: A Little ?Help needed standing up from a chair using your arms (e.g., wheelchair or bedside chair)?: A Little ?Help needed to walk in hospital room?: A Little ?Help needed climbing 3-5 steps with a railing? : Total ?6 Click Score: 16 ? ?  ?End of Session Equipment Utilized During Treatment: Gait belt;Oxygen ?Activity Tolerance: Patient tolerated treatment well ?Patient left: in bed;with call bell/phone within reach ?Nurse Communication: Mobility status ?PT Visit Diagnosis: Muscle weakness (generalized) (M62.81);Difficulty in walking, not elsewhere classified (R26.2);Unsteadiness on feet (R26.81);Other abnormalities of gait and mobility (R26.89);History of falling (Z91.81) ?  ? ? ?Time: 0277-4128 ?PT Time Calculation (min) (ACUTE ONLY): 29 min ? ?Charges:  $Therapeutic Exercise: 8-22 mins ?$Therapeutic Activity: 8-22 mins          ?          ? ?West Carbo, PT, DPT  ? ?Acute Rehabilitation Department ?Pager #: [phone] - [phone] ? ? ?[name] ?03/18/2022, 6:19 PM ? ?

## 2022-03-18 NOTE — Care Management Important Message (Signed)
Important Message ? ?Patient Details  ?Name: Alicia [name] ?MRN: [id number] ?Date of Birth: 12/16/1950 ? ? ?Medicare Important Message Given:  Yes ? ? ? ? ?[name] ?03/18/2022, 7:29 AM ?

## 2022-03-18 NOTE — Progress Notes (Addendum)
Inpatient Diabetes Program Recommendations ? ?AACE/ADA: New Consensus Statement on Inpatient Glycemic Control  ? ?Target Ranges:  Prepandial:   less than 140 mg/dL ?     Peak postprandial:   less than 180 mg/dL (1-2 hours) ?     Critically ill patients:  140 - 180 mg/dL  ? ? Latest Reference Range & Units 03/18/22 00:55 03/18/22 06:02 03/18/22 06:34  ?Glucose-Capillary 70 - 99 mg/dL 284 (H) 64 (L) 118 (H)  ? ? Latest Reference Range & Units 03/17/22 10:51 03/17/22 11:55 03/17/22 13:06 03/17/22 14:17 03/17/22 16:05 03/17/22 20:43  ?Glucose-Capillary 70 - 99 mg/dL 124 (H) 125 (H) 124 (H) 157 (H) 211 (H) ? ?Novolog 7 units 462 (H) ? ?Novolog 25 units @ 21:22  ? ? Latest Reference Range & Units 03/17/22 00:22 03/17/22 03:59 03/17/22 07:50 03/17/22 08:16  ?Glucose-Capillary 70 - 99 mg/dL 172 (H) 73 68 (L) 118 (H)  ? ? Latest Reference Range & Units 12/01/21 04:02  ?Hemoglobin A1C 4.8 - 5.6 % 6.4 (H)  ? ?Review of Glycemic Control ? ?Diabetes history: DM2 ?Outpatient Diabetes medications: Januvia 25 mg daily (not taking; per home med list pt stopped taking due to how it made de her feel) ?Current orders for Inpatient glycemic control: Novolog 0-20 units AC&HS; Prednisone 40 mg QAM ? ?Inpatient Diabetes Program Recommendations:   ? ?Insulin: Please consider decreasing Novolog correction to 0-15 units AC&HS. If steroids are continued, please consider ordering Novolog 3 units TID with meals for meal coverage if patient eats at least 50% of meals. ? ?Thanks, ?Barnie Alderman, RN, MSN, CDE ?Diabetes Coordinator ?Inpatient Diabetes Program ?[phone] (Team Pager from 8am to 5pm) ? ? ? ?

## 2022-03-19 DIAGNOSIS — I509 Heart failure, unspecified: Secondary | ICD-10-CM

## 2022-03-19 DIAGNOSIS — W19XXXA Unspecified fall, initial encounter: Secondary | ICD-10-CM | POA: Diagnosis not present

## 2022-03-19 LAB — GLUCOSE, CAPILLARY
Glucose-Capillary: 62 mg/dL — ABNORMAL LOW (ref 70–99)
Glucose-Capillary: 174 mg/dL — ABNORMAL HIGH (ref 70–99)
Glucose-Capillary: 102 mg/dL — ABNORMAL HIGH (ref 70–99)
Glucose-Capillary: 146 mg/dL — ABNORMAL HIGH (ref 70–99)
Glucose-Capillary: 167 mg/dL — ABNORMAL HIGH (ref 70–99)
Glucose-Capillary: 162 mg/dL — ABNORMAL HIGH (ref 70–99)

## 2022-03-19 LAB — BASIC METABOLIC PANEL
Anion gap: 9 (ref 5–15)
BUN: 54 mg/dL — ABNORMAL HIGH (ref 8–23)
CO2: 30 mmol/L (ref 22–32)
Calcium: 7.2 mg/dL — ABNORMAL LOW (ref 8.9–10.3)
Chloride: 97 mmol/L — ABNORMAL LOW (ref 98–111)
Creatinine, Ser: 3.46 mg/dL — ABNORMAL HIGH (ref 0.44–1.00)
GFR, Estimated: 14 mL/min — ABNORMAL LOW (ref >60)
Glucose, Bld: 187 mg/dL — ABNORMAL HIGH (ref 70–99)
Potassium: 4 mmol/L (ref 3.5–5.1)
Sodium: 136 mmol/L (ref 135–145)

## 2022-03-19 MED ORDER — INSULIN ASPART 100 UNIT/ML IJ SOLN
6.0000 [IU] | Freq: Three times a day (TID) | INTRAMUSCULAR | Status: DC
  Administered 2022-03-19 – 2022-03-23 (×14): 6 [IU] via SUBCUTANEOUS

## 2022-03-19 MED ORDER — FUROSEMIDE 40 MG PO TABS
80.0000 mg | ORAL_TABLET | Freq: Two times a day (BID) | ORAL | Status: DC
  Administered 2022-03-20 – 2022-03-22 (×5): 80 mg via ORAL
  Filled 2022-03-19 (×6): qty 2

## 2022-03-19 MED ORDER — FUROSEMIDE 10 MG/ML IJ SOLN
80.0000 mg | Freq: Once | INTRAMUSCULAR | Status: AC
  Administered 2022-03-19: 80 mg via INTRAVENOUS
  Filled 2022-03-19: qty 8

## 2022-03-19 MED ORDER — INSULIN ASPART 100 UNIT/ML IJ SOLN: 0.0000 [IU] | Freq: Every day | INTRAMUSCULAR | Status: DC

## 2022-03-19 MED ORDER — INSULIN ASPART 100 UNIT/ML IJ SOLN
0.0000 [IU] | Freq: Three times a day (TID) | INTRAMUSCULAR | Status: DC
  Administered 2022-03-19: 3 [IU] via SUBCUTANEOUS
  Administered 2022-03-19: 4 [IU] via SUBCUTANEOUS
  Administered 2022-03-20: 3 [IU] via SUBCUTANEOUS
  Administered 2022-03-21: 7 [IU] via SUBCUTANEOUS
  Administered 2022-03-21 – 2022-03-22 (×2): 4 [IU] via SUBCUTANEOUS
  Administered 2022-03-23: 7 [IU] via SUBCUTANEOUS
  Administered 2022-03-23: 3 [IU] via SUBCUTANEOUS
  Administered 2022-03-23: 4 [IU] via SUBCUTANEOUS

## 2022-03-19 NOTE — Progress Notes (Signed)
CBG 62, patient asymptomatic, 4 oz of juice giver per hypoglycemic protocol. ? ?BP (!) 188/74   Pulse 71   Temp 98.5 ?F (36.9 ?C) (Oral)   Resp 18   Ht '5\' 7"'$  (1.702 m)   Wt 121.3 kg   SpO2 98%   BMI 41.90 kg/m?   ? ?Repeat CBG 174. ?

## 2022-03-19 NOTE — TOC Progression Note (Signed)
Transition of Care (TOC) - Progression Note  ? ? ?Patient Details  ?Name: Alicia [name] ?MRN: [id number] ?Date of Birth: 17-Apr-1951 ? ?Transition of Care (TOC) CM/SW Contact  ?Bary Castilla, LCSW ?Phone Number:[phone] ?03/19/2022, 3:13 PM ? ?Clinical Narrative:    ? ?CSW spoke with Juliann Pulse from [facility] to check on pt's authorization.Juliann Pulse stated that Josem Kaufmann was still pending. Most likely will be on Monday. ? ?TOC team will continue to assist with discharge planning needs.  ? ?Expected Discharge Plan: [place] ?Barriers to Discharge: Continued Medical Work up ? ?Expected Discharge Plan and Services ?Expected Discharge Plan: [place] ?In-house Referral: Clinical Social Work ?  ?Post Acute Care Choice: [place] ?Living arrangements for the past 2 months: [place] ?                ?  ?  ?  ?  ?  ?  ?  ?  ?  ?  ? ? ?Social Determinants of Health (SDOH) Interventions ?  ? ?Readmission Risk Interventions ? ?  03/02/2020  ?  1:08 PM  ?Readmission Risk Prevention Plan  ?Transportation Screening Complete  ?PCP or Specialist Appt within 5-7 Days Complete  ?Home Care Screening Complete  ?Medication Review (RN CM) Complete  ? ? ?

## 2022-03-19 NOTE — Progress Notes (Signed)
? ? ?HD#3 ?SUBJECTIVE:  ?Patient Summary: Alicia [name] is a 71 y.o. with a pertinent PMH of hypertension, CHF, who presented with weakness and fall and admitted for hypertension, hyperglycemia.  ? ?Overnight Events: NAEO  ? ?  ?Interm History: Patient seen and evaluated bedside.  States she was able to wear stockings throughout the day yesterday but took them off at night.  Does think they helped but also reports some mild cramping in her foot.  Was able to get up to chair.  No other complaints at this time. ? ?OBJECTIVE:  ?Vital Signs: ?Vitals:  ? 03/18/22 1955 03/19/22 0350 03/19/22 0500 03/19/22 1139  ?BP: (!) 162/79 (!) 188/74  (!) 172/59  ?Pulse: 66 71  62  ?Resp: '17 18  18  '$ ?Temp: 98.3 ?F (36.8 ?C) 98.5 ?F (36.9 ?C)  97.8 ?F (36.6 ?C)  ?TempSrc: Oral Oral  Oral  ?SpO2: 97% 98%  100%  ?Weight:   122.3 kg   ?Height:      ? ?Supplemental O2:  ?SpO2: 100 % ?O2 Flow Rate (L/min): 2 L/min ? ?Filed Weights  ? 03/17/22 0427 03/18/22 0600 03/19/22 0500  ?Weight: 119.7 kg 121.3 kg 122.3 kg  ? ? ? ?Intake/Output Summary (Last 24 hours) at 03/19/2022 1151 ?Last data filed at 03/19/2022 1141 ?Gross per 24 hour  ?Intake 840 ml  ?Output 1325 ml  ?Net -485 ml  ? ?Net IO Since Admission: -1,360.81 mL [03/19/22 1151] ? ?Physical Exam: ?Constitutional: Chronically ill-appearing female sitting up in bed, in no acute distress ?Cardiovascular: regular rate and rhythm, no m/r/g,  edema left lower extremity 2+, 1-2+ pitting edema right leg,  no JVD ?Pulmonary/Chest: breathing comfortably on 2L [place], crackles lung bases. ?Abdominal: soft, non-tender, non-distended ?MSK: normal bulk and tone ?Skin: warm and dry ?Psych: Mood and affect appropriate ? ?Patient Lines/Drains/Airways Status   ? ? Active Line/Drains/Airways   ? ? Name Placement date Placement time Site Days  ? Peripheral IV 03/15/22 20 G 1.88" Right Antecubital 03/15/22  0833  Antecubital  less than 1  ? External Urinary Catheter 03/14/22  2021  --  1  ? ?  ?  ? ?   ? ? ?Pertinent Labs: ? ?  Latest Ref Rng & Units 03/15/2022  ?  8:34 AM 03/14/2022  ?  8:37 AM 03/14/2022  ?  6:24 AM  ?CBC  ?WBC 4.0 - 10.5 K/uL 6.7   9.8     ?Hemoglobin 12.0 - 15.0 g/dL 12.9   11.7   11.9    ?Hematocrit 36.0 - 46.0 % 40.1   36.5   35.0    ?Platelets 150 - 400 K/uL 235   224     ? ? ? ?  Latest Ref Rng & Units 03/19/2022  ?  9:18 AM 03/18/2022  ?  1:32 AM 03/17/2022  ?  4:11 AM  ?CMP  ?Glucose 70 - 99 mg/dL 187   220   66    ?BUN 8 - 23 mg/dL 54   52   47    ?Creatinine 0.44 - 1.00 mg/dL 3.46   3.19   3.08    ?Sodium 135 - 145 mmol/L 136   137   139    ?Potassium 3.5 - 5.1 mmol/L 4.0   3.6   3.5    ?Chloride 98 - 111 mmol/L 97   99   99    ?CO2 22 - 32 mmol/L '30   30   31    '$ ?Calcium  8.9 - 10.3 mg/dL 7.2   7.3   7.7    ? ? ?Recent Labs  ?  03/19/22 ?0444 03/19/22 ?0724 03/19/22 ?1139  ?GLUCAP 174* 102* 146*  ?  ? ?ASSESSMENT/PLAN:  ?Assessment: ?Principal Problem: ?  Fall ?Active Problems: ?  Severe uncontrolled hypertension ?  Diabetes mellitus type 2 in obese [facility]) ?  GCA (giant cell arteritis) ([place]) ?  Congestive heart failure (CHF) ([place]) ? ?Acute on Chronic HFpEF ?SHe has a history of medication nonadherence.  This is likely the precipitant for her CHF exacerbation in addition to her poorly controlled hypertension. ?- Patient had echo performed yesterday which showed EF 55 to 60% and grade 2 diastolic dysfunction.  No regional wall motion abnormalities. ?-Appears comfortable, saturating well 98% on 2 L.  Blood pressure has been stable.  Urine output appears to have slowed since switching to oral Lasix, renal function showing increasing creatinine 3.4, weight has increased by 1 kg.  She likely is not responding well to her oral medicines.  We will give her additional one-time dose of Lasix 80 mg IV to facilitate diuresis and monitor for response.  This may also be related to patient's prednisone that she was receiving. ?- Lasix 80 mg twice daily oral, one-time dose 80 mg IV today ?- Compression  stockings lower extremities. ?- Strict I's and O's ?- Daily weights ?- OOB when able, encourage ambulation, PT has evaluated the patient recommending SNF ?-Oxygen as needed for exertion ?- Monitor renal function ?  ?Chronic Severe Asymptomatic Hypertension ?Blood pressure difficult to control despite multiple medications.  There is suspicion by primary care physician for hyperaldosteronism.  Likely also related to patient's poor renal function.  Blood pressure is stable currently.   ?- Metoprolol 50 mg ?- Amlodipine 10 ?- Aldactone to 50 mg/day. ?-We will continue to monitor this.  May be able to discontinue additional medications if spironolactone proves more effective. ?  ?Diabetes ?-Blood sugars were noted to be elevated yesterday up to 360.  This morning they are improved, however sugar is noted to elevated in the afternoons past 2 days.  We have changed her from sliding scale insulin to mealtime insulin with nightly coverage.  We will continue to monitor. ? ?Suspected giant cell arteritis ?Steroid intolerance ?Patient underwent temporal artery biopsy.  She has been doing well postoperatively.  Pathology results showed no evidence of arterial inflammation..  Prednisone has been discontinued, however will likely take some time for effects to wear off. ? ?CKD ?Evidence of volume overload on exam and increasing creatinine suggesting vascular congestion.  Patient currently receiving oral Lasix but will give IV dose to help relieve congestion and improve renal perfusion. ?- Monitor with daily BMPs.   ?- Avoid nephrotoxic drugs. ? ?Deconditioning. ?Physical therapy has evaluated the patient, recommending short-term SNF.  Social work has been consulted and FL 2 has been signed. ?Physical therapy continues to follow along and ambulate patient. ? ? ?Signature: ?Delene Ruffini, MD  ?Internal Medicine Resident, PGY-1 ?[facility]  ?Pager: [phone] ?11:51 AM, 03/19/2022  ? ?Please contact  the on call pager after 5 pm and on weekends at [phone]. ? ? ?

## 2022-03-20 LAB — RENAL FUNCTION PANEL
Albumin: 2.5 g/dL — ABNORMAL LOW (ref 3.5–5.0)
Anion gap: 7 (ref 5–15)
BUN: 55 mg/dL — ABNORMAL HIGH (ref 8–23)
CO2: 30 mmol/L (ref 22–32)
Calcium: 7.2 mg/dL — ABNORMAL LOW (ref 8.9–10.3)
Chloride: 100 mmol/L (ref 98–111)
Creatinine, Ser: 3.21 mg/dL — ABNORMAL HIGH (ref 0.44–1.00)
GFR, Estimated: 15 mL/min — ABNORMAL LOW (ref >60)
Glucose, Bld: 92 mg/dL (ref 70–99)
Phosphorus: 4.6 mg/dL (ref 2.5–4.6)
Potassium: 3.7 mmol/L (ref 3.5–5.1)
Sodium: 137 mmol/L (ref 135–145)

## 2022-03-20 LAB — GLUCOSE, CAPILLARY
Glucose-Capillary: 90 mg/dL (ref 70–99)
Glucose-Capillary: 134 mg/dL — ABNORMAL HIGH (ref 70–99)
Glucose-Capillary: 77 mg/dL (ref 70–99)
Glucose-Capillary: 137 mg/dL — ABNORMAL HIGH (ref 70–99)

## 2022-03-20 MED ORDER — SPIRONOLACTONE 25 MG PO TABS
100.0000 mg | ORAL_TABLET | Freq: Every day | ORAL | Status: DC
  Administered 2022-03-20 – 2022-03-23 (×4): 100 mg via ORAL
  Filled 2022-03-20 (×4): qty 4

## 2022-03-20 NOTE — Progress Notes (Signed)
Patient complains about left, great toe being sore since yesterday. She wanted a physician to take a look at it. Physically, toe looks fine. ?

## 2022-03-20 NOTE — Progress Notes (Signed)
? ?Subjective:  ? ?Hospital day:6 ? ?Overnight event:No acute events overnight ? ?Interim History: Patient evaluated at the bedside laying comfortably in bed.  States her breathing is about the same. Denies any chest pain, nausea/vomiting last BM was about 2 days ago. ? ?Objective: ? ?Vital signs in last 24 hours: ?Vitals:  ? 03/19/22 1139 03/19/22 2007 03/20/22 0100 03/20/22 0415  ?BP: (!) 172/59 (!) 180/71  (!) 181/73  ?Pulse: 62 63  70  ?Resp: '18 14  15  '$ ?Temp: 97.8 ?F (36.6 ?C) 97.8 ?F (36.6 ?C)  98.4 ?F (36.9 ?C)  ?TempSrc: Oral Oral  Oral  ?SpO2: 100% 98%  97%  ?Weight:   123 kg   ?Height:      ? ? ?Filed Weights  ? 03/18/22 0600 03/19/22 0500 03/20/22 0100  ?Weight: 121.3 kg 122.3 kg 123 kg  ? ? ? ?Intake/Output Summary (Last 24 hours) at 03/20/2022 0657 ?Last data filed at 03/20/2022 0416 ?Gross per 24 hour  ?Intake 898 ml  ?Output 2025 ml  ?Net -1127 ml  ? ?Net IO Since Admission: -2,272.81 mL [03/20/22 0657] ? ?Recent Labs  ?  03/19/22 ?1619 03/19/22 ?2054 03/20/22 ?4680  ?GLUCAP 167* 162* 90  ?  ? ?Pertinent Labs: ? ?  Latest Ref Rng & Units 03/15/2022  ?  8:34 AM 03/14/2022  ?  8:37 AM 03/14/2022  ?  6:24 AM  ?CBC  ?WBC 4.0 - 10.5 K/uL 6.7   9.8     ?Hemoglobin 12.0 - 15.0 g/dL 12.9   11.7   11.9    ?Hematocrit 36.0 - 46.0 % 40.1   36.5   35.0    ?Platelets 150 - 400 K/uL 235   224     ? ? ? ?  Latest Ref Rng & Units 03/20/2022  ?  2:25 AM 03/19/2022  ?  9:18 AM 03/18/2022  ?  1:32 AM  ?CMP  ?Glucose 70 - 99 mg/dL 92   187   220    ?BUN 8 - 23 mg/dL 55   54   52    ?Creatinine 0.44 - 1.00 mg/dL 3.21   3.46   3.19    ?Sodium 135 - 145 mmol/L 137   136   137    ?Potassium 3.5 - 5.1 mmol/L 3.7   4.0   3.6    ?Chloride 98 - 111 mmol/L 100   97   99    ?CO2 22 - 32 mmol/L '30   30   30    '$ ?Calcium 8.9 - 10.3 mg/dL 7.2   7.2   7.3    ? ? ?Imaging: ?No results found. ? ?Physical Exam ? ?General: Pleasant, chronically ill-appearing obese woman laying in bed. No acute distress. ?CV: RRR. No m/r/g. 2+ pitting edema LLE,  1+ pitting edema RLE ?Pulmonary: On 2 L [place]. Decreased breath sounds. Normal effort. Bibasilar crackles. ?Abdominal: Soft, nontender, nondistended. Normal bowel sounds. ?Extremities: 2+ distal pulses. Normal ROM. ?Skin: Warm and dry. No obvious rash or lesions. ?Neuro: A&Ox3. Moves all extremities. Normal sensation to gross touch.  ?Psych: Normal mood and affect ? ? ?Assessment/Plan: ?Alicia [name] is a 71 y.o. female with hx of severe hypertension, HFpEF, CKD stage IV and type 2 diabetes who presented with weakness and fall and admitted for hypertension and hyperglycemia.  Patient currently pending SNF placement for acute rehab. ? ?Principal Problem: ?  Fall ?Active Problems: ?  Severe uncontrolled hypertension ?  Diabetes mellitus type 2 in obese (  [place]) ?  GCA (giant cell arteritis) ([place]) ?  Congestive heart failure (CHF) ([place]) ? ?Acute on chronic HFpEF ?Exacerbation likely secondary to medication nonadherence.  Echo significant for EF 55 to 60% and G2DD. UOP of 2 L in the last 24 hours. Patient up 11 pounds since admission based on weight patient does not reflect her net I&O of -1.9 L since admission. She continues to be on 2 L [place]. Kidney function improving with diuresing. ?--Continue Lasix 80 mg twice daily ?--Continue compression socks ?--Strict I&O's, daily weights ?--PT recommending SNF, authorization pending, likely Monday per CSW ?--Check pulse ox with ambulation ? ?Chronic severe hypertension ?Hyperaldosteronism ?Elevated aldosterone/renin ratio.  Uncontrolled blood pressure likely combination of hypoaldosteronism and CKD. BP still elevated with SBP in the 180s overnight.  ?--Increase spironolactone from 50 to 100 mg daily ?--Continue metoprolol 50 mg daily ?--Continue amlodipine 10 mg daily ? ?CKD stage IV ?Improvement in his his creatinine from 3.46-3.21 today.  No electrolyte abnormalities.  Making appropriate urine output from diuresing. ?--Avoid nephrotoxic's agent ?--Daily BMP ? ?T2DM ?CBGs  improved with fasting sugar of 90 this morning.  Current CBG of 134. ?--Continue SSI with meals ?--CBG monitoring ? ?General deconditioning ?Hypoalbuminemia ?Albumin of 2.5.  Low albumin likely contributing lower extremity edema. Physical therapy evaluated patient and recommended SNF placement.  CSW waiting for insurance authorization. ?--Pending insurance authorization for SNF placement ? ?Diet: CM ?IVF: None ?VTE: Lovenox ?CODE: Full ? ?Prior to Admission Living Arrangement: Home ?Anticipated Discharge Location: SNF ?Barriers to Discharge: Pending SNF authorization ?Dispo: Anticipated discharge in approximately 1-2 day(s).  ? ?Signed: ?Lacinda Axon, MD ?03/20/2022, 6:57 AM  ?Pager: [phone] ?Internal Medicine Teaching Service ?After 5pm on weekdays and 1pm on weekends: On Call pager: [phone] ? ?

## 2022-03-21 DIAGNOSIS — I5033 Acute on chronic diastolic (congestive) heart failure: Secondary | ICD-10-CM | POA: Diagnosis not present

## 2022-03-21 DIAGNOSIS — E269 Hyperaldosteronism, unspecified: Secondary | ICD-10-CM

## 2022-03-21 DIAGNOSIS — N184 Chronic kidney disease, stage 4 (severe): Secondary | ICD-10-CM

## 2022-03-21 DIAGNOSIS — E1122 Type 2 diabetes mellitus with diabetic chronic kidney disease: Secondary | ICD-10-CM | POA: Diagnosis not present

## 2022-03-21 DIAGNOSIS — I130 Hypertensive heart and chronic kidney disease with heart failure and stage 1 through stage 4 chronic kidney disease, or unspecified chronic kidney disease: Secondary | ICD-10-CM | POA: Diagnosis not present

## 2022-03-21 DIAGNOSIS — W19XXXA Unspecified fall, initial encounter: Secondary | ICD-10-CM | POA: Diagnosis not present

## 2022-03-21 LAB — GLUCOSE, CAPILLARY
Glucose-Capillary: 115 mg/dL — ABNORMAL HIGH (ref 70–99)
Glucose-Capillary: 182 mg/dL — ABNORMAL HIGH (ref 70–99)
Glucose-Capillary: 233 mg/dL — ABNORMAL HIGH (ref 70–99)
Glucose-Capillary: 118 mg/dL — ABNORMAL HIGH (ref 70–99)

## 2022-03-21 LAB — BASIC METABOLIC PANEL
Anion gap: 10 (ref 5–15)
BUN: 57 mg/dL — ABNORMAL HIGH (ref 8–23)
CO2: 30 mmol/L (ref 22–32)
Calcium: 7.1 mg/dL — ABNORMAL LOW (ref 8.9–10.3)
Chloride: 97 mmol/L — ABNORMAL LOW (ref 98–111)
Creatinine, Ser: 3.21 mg/dL — ABNORMAL HIGH (ref 0.44–1.00)
GFR, Estimated: 15 mL/min — ABNORMAL LOW (ref >60)
Glucose, Bld: 124 mg/dL — ABNORMAL HIGH (ref 70–99)
Potassium: 4 mmol/L (ref 3.5–5.1)
Sodium: 137 mmol/L (ref 135–145)

## 2022-03-21 LAB — CBC
HCT: 35.4 % — ABNORMAL LOW (ref 36.0–46.0)
Hemoglobin: 10.9 g/dL — ABNORMAL LOW (ref 12.0–15.0)
MCH: 28.3 pg (ref 26.0–34.0)
MCHC: 30.8 g/dL (ref 30.0–36.0)
MCV: 91.9 fL (ref 80.0–100.0)
Platelets: 173 10*3/uL (ref 150–400)
RBC: 3.85 MIL/uL — ABNORMAL LOW (ref 3.87–5.11)
RDW: 14.6 % (ref 11.5–15.5)
WBC: 6 10*3/uL (ref 4.0–10.5)
nRBC: 0 % (ref 0.0–0.2)

## 2022-03-21 LAB — MAGNESIUM: Magnesium: 2 mg/dL (ref 1.7–2.4)

## 2022-03-21 MED ORDER — CALCIUM GLUCONATE-NACL 1-0.675 GM/50ML-% IV SOLN
1.0000 g | Freq: Once | INTRAVENOUS | Status: AC
  Administered 2022-03-21: 1000 mg via INTRAVENOUS
  Filled 2022-03-21: qty 50

## 2022-03-21 NOTE — TOC Progression Note (Signed)
Transition of Care (TOC) - Progression Note  ? ? ?Patient Details  ?Name: Alicia [name] ?MRN: [id number] ?Date of Birth: Oct 02, 1951 ? ?Transition of Care (TOC) CM/SW Contact  ?Kunaal Walkins Renold Don, LCSWA ?Phone Number: ?03/21/2022, 2:45 PM ? ?Clinical Narrative:    ?CSW contacted pt about DC to a SNF bc she has been refusing with the nurse and the resident. Pt states she is still willing to go and that she has her niece going to get some of her things from home. CSW reached out to [place] at [facility], Josem Kaufmann is still pending CSW will continue to follow for auth.  ? ? ?Expected Discharge Plan: [place] ?Barriers to Discharge: Continued Medical Work up ? ?Expected Discharge Plan and Services ?Expected Discharge Plan: [place] ?In-house Referral: Clinical Social Work ?  ?Post Acute Care Choice: [place] ?Living arrangements for the past 2 months: [place] ?                ?  ?  ?  ?  ?  ?  ?  ?  ?  ?  ? ? ?Social Determinants of Health (SDOH) Interventions ?  ? ?Readmission Risk Interventions ? ?  03/02/2020  ?  1:08 PM  ?Readmission Risk Prevention Plan  ?Transportation Screening Complete  ?PCP or Specialist Appt within 5-7 Days Complete  ?Home Care Screening Complete  ?Medication Review (RN CM) Complete  ? ? ?

## 2022-03-21 NOTE — Progress Notes (Addendum)
Physical Therapy Treatment ?Patient Details ?Name: Alicia [name] ?MRN: [id number] ?DOB: 08/06/1951 ?Today's Date: 03/21/2022 ? ? ?History of Present Illness Pt is a 71 y.o. female who presented on 4/24 for temporal artery biopsy but surgery cancelled due to fall, generalized weakness, hyperglycemia, and persistent hypertension. S/P temporal artery biopsy 4/27. PMH: chronic diastolic heart failure, hyperlipidemia, hypertension, peripheral vascular disease, chronic respiratory failure on 2L supplemental oxygen, previous CVA, type II diabetes mellitus, class III obesity, chronic renal insufficiency. ? ?  ?PT Comments  ? ? Pt received in recliner, agreeable to therapy session with emphasis on transfer training, activity pacing, standing LE exercises for strengthening and safety with mobility tasks for fall risk prevention.  Pt needing further instruction on importance of maintaining O2 with exertion and decreased insight into safety today with multiple unsteady stand>sit transfers; despite cues and reciprocal transfer training, pt tending to sit with poor eccentric control and without reaching back for chair. She remains motivated to progress strength/endurance and good participation given encouragement. Pt continues to benefit from PT services to progress toward functional mobility goals.   ?Recommendations for follow up therapy are one component of a multi-disciplinary discharge planning process, led by the attending physician.  Recommendations may be updated based on patient status, additional functional criteria and insurance authorization. ? ?Follow Up Recommendations ? Skilled nursing-short term rehab (<3 hours/day) ?  ?  ?Assistance Recommended at Discharge Intermittent Supervision/Assistance  ?Patient can return home with the following A little help with bathing/dressing/bathroom;Assistance with cooking/housework;Assist for transportation;Help with stairs or ramp for entrance;A little help with walking and/or  transfers ?  ?Equipment Recommendations ? None recommended by PT  ?  ?Recommendations for Other Services   ? ? ?  ?Precautions / Restrictions Precautions ?Precautions: Fall ?Precaution Comments: monitor SpO2 and BP, on 2L O2 during session ?Restrictions ?Weight Bearing Restrictions: No  ?  ? ?Mobility ? Bed Mobility ?  ?  ?  ?  ?  ?  ?  ?General bed mobility comments: received/remained in recliner ?  ? ?Transfers ?Overall transfer level: Needs assistance ?Equipment used: Rolling walker (2 wheels) ?Transfers: Sit to/from Stand ?Sit to Stand: Min assist, Mod assist ?  ?  ?  ?  ?  ?General transfer comment: pt needing max cues for initial sit>stand and unable to stand with min guard or minA on first 2 attempts, needed momentum and modA to achieve upright. Afterward, pt at times able to stand with minA and rocking for momentum but mostly modA; x7 total reps from chair and 1 from [facility]. ?  ? ?Ambulation/Gait ?Ambulation/Gait assistance: Min guard ?Gait Distance (Feet): 5 Feet (x2 trials (to/from [facility] near recliner)) ?Assistive device: Rolling walker (2 wheels) ?Gait Pattern/deviations: Step-through pattern, Decreased stride length ?Gait velocity: reduced ?  ?Pre-gait activities: standing hip flexion x30 reps (10 reps x3 sets) ?General Gait Details: slow, small steps with heavy reliance on BUE support from recliner<>BSC. SpO2 stable on 2L with exertion (93%), DOE 2/4. Distance limited due to fatigue/need for toileting. ? ?  ?Balance Overall balance assessment: Needs assistance ?Sitting-balance support: Feet supported, No upper extremity supported ?Sitting balance-Leahy Scale: Good ?  ?  ?Standing balance support: Bilateral upper extremity supported, During functional activity ?Standing balance-Leahy Scale: Poor ?Standing balance comment: relies on RW, able to stand at sink with U UE support for some tasks but tending to rest B forearms on counter for tasks involving BUE (capping toothpaste, drying hands) ?  ?  ?  ?  ?  ?  ?   ?  ?  ?  ?  ?  ? ?  ?  Cognition Arousal/Alertness: Awake/alert ?Behavior During Therapy: [place] for tasks assessed/performed ?Overall Cognitive Status: Impaired/Different from baseline ?Area of Impairment: Safety/judgement, Problem solving, Memory ?  ?  ?  ?  ?  ?  ?  ?  ?  ?  ?Memory: Decreased short-term memory ?  ?Safety/Judgement: Decreased awareness of safety ?  ?Problem Solving: Difficulty sequencing, Requires verbal cues ?General Comments: Pt pleasant and participatory but noted decreased recall of safe hand placement within session for transfers and pt with decreased insight into need for O2. Pt states "I don't need the oxygen while I walk, I'll put it on after" and given instruction on need for O2 with exertion, pt did desat to 82% post-exertion on RA. Afterward pt maintains 93% with 2L for standing activity and receptive to need for O2 with mobility, will continue to reinforce. ?  ?  ? ?  ?Exercises Other Exercises ?Other Exercises: standing hip flexion x30 reps (10 reps x3 sets with seated breaks) ?Other Exercises: standing BLE AROM: heel raises x10 reps ?Other Exercises: static standing U UE support 3 mins x 2 sets ?Other Exercises: STS x 5 reps (reciprocal but with increased time/rest breaks due to DOE) ? ?  ?General Comments General comments (skin integrity, edema, etc.): SpO2 WFL on 2L O2 [place] with exertion, pt initially states "It's OK to take [place] off when I walk" so trial of pt on RA, 89-92% resting on RA and desat to 82% after standing hip flexion x10 and seated break on RA. 2L O2 [place] replaced and maintains 93-97% on 2L with rest and exertion. Reinforced importance of pt maintaining 2L O2 [place] with all activity, especially standing tasks. ?  ?  ? ?Pertinent Vitals/Pain Pain Assessment ?Pain Assessment: No/denies pain ?Pain Intervention(s): Monitored during session, Repositioned  ? ? ? ?PT Goals (current goals can now be found in the care plan section) Acute Rehab PT Goals ?Patient Stated Goal: to return to  independence and go home ?PT Goal Formulation: With patient ?Time For Goal Achievement: 03/29/22 ?Progress towards PT goals: Progressing toward goals ? ?  ?Frequency ? ? ? Min 3X/week ? ? ? ?  ?PT Plan Current plan remains appropriate  ? ? ?   ?AM-PAC PT "6 Clicks" Mobility   ?Outcome Measure ? Help needed turning from your back to your side while in a flat bed without using bedrails?: A Little ?Help needed moving from lying on your back to sitting on the side of a flat bed without using bedrails?: A Lot ?Help needed moving to and from a bed to a chair (including a wheelchair)?: A Little ?Help needed standing up from a chair using your arms (e.g., wheelchair or bedside chair)?: A Lot ?Help needed to walk in hospital room?: A Little ?Help needed climbing 3-5 steps with a railing? : Total ?6 Click Score: 14 ? ?  ?End of Session Equipment Utilized During Treatment: Gait belt;Oxygen ?Activity Tolerance: Patient tolerated treatment well ?Patient left: with call bell/phone within reach;in chair;with chair alarm set ?Nurse Communication: Mobility status ?PT Visit Diagnosis: Muscle weakness (generalized) (M62.81);Difficulty in walking, not elsewhere classified (R26.2);Unsteadiness on feet (R26.81);Other abnormalities of gait and mobility (R26.89);History of falling (Z91.81) ?  ? ? ?Time: 3810-1751 ?PT Time Calculation (min) (ACUTE ONLY): 28 min ? ?Charges:  $Therapeutic Exercise: 8-22 mins ?$Therapeutic Activity: 8-22 mins          ?          ? ?Napolean Sia P., PTA ?Acute Rehabilitation Services ?Secure Chat Preferred 9a-5:30pm ?Office:  [phone]  ? ? ?Kara Pacer Abbygael Curtiss ?03/21/2022, 12:03 PM ? ?

## 2022-03-21 NOTE — Plan of Care (Signed)
  Problem: Clinical Measurements: Goal: Respiratory complications will improve Outcome: Progressing   Problem: Activity: Goal: Risk for activity intolerance will decrease Outcome: Progressing   

## 2022-03-21 NOTE — Progress Notes (Signed)
? ?Alicia [name] is a 71 y.o. with a pertinent PMH of hypertension, CHF, who presented with weakness and fall and admitted for hypertension, hyperglycemia and found to be in CHF exacerbation on hospital day 5. ?Subjective:  ?Overnight:PVCs overnight. ? ?Pt was seen and evaluated during rounds today. She reports feeling better overall. She reports sleeping okay, but did notice headaches overnight.  ? ? ?Objective: ? ?Vital signs in last 24 hours: ?Vitals:  ? 03/20/22 1941 03/21/22 0250 03/21/22 0418 03/21/22 0716  ?BP: (!) 151/57  (!) 163/71 (!) 168/67  ?Pulse: 65  69 71  ?Resp: '16  15 10  '$ ?Temp: 98.5 ?F (36.9 ?C)  98.6 ?F (37 ?C) 98.7 ?F (37.1 ?C)  ?TempSrc: Oral  Oral Oral  ?SpO2: 98%  97% 96%  ?Weight:  121.2 kg    ?Height:      ? ?Supplemental O2: Nasal [place] ?SpO2: 96 % ?O2 Flow Rate (L/min): 2 L/min ?Filed Weights  ? 03/19/22 0500 03/20/22 0100 03/21/22 0250  ?Weight: 122.3 kg 123 kg 121.2 kg  ? ? ?Intake/Output Summary (Last 24 hours) at 03/21/2022 1103 ?Last data filed at 03/21/2022 0900 ?Gross per 24 hour  ?Intake 1190 ml  ?Output 1825 ml  ?Net -635 ml  ? ?Net IO Since Admission: -2,547.81 mL [03/21/22 1103] ?Physical Exam ?General: NAD ?Head:  NCAT ?Lungs: bibasilar rales present, no wheeze ?Cardiovascular: NSR, 2+ radial pulses, 2+ LE edema ?Abdomen: No TTP, normal bowel sounds ?MSK: No asymmetry ?Skin:no lesions on exposed skin ?Neuro: alert and oriented ?Psych: normal mood and normal affect ? ?Diagnostics ?CBC with hgb 12.9 to 10.9. BMP Cr stable from yesterday. Calcium unchanged.  ? ?Assessment/Plan: ?Alicia [name] is a 71 y.o. with a pertinent PMH of hypertension, CHF, who presented with weakness and fall and admitted for hypertension, hyperglycemia and found to be in CHF exacerbation on hospital day 5 ? ?Principal Problem: ?  Fall ?Active Problems: ?  Severe uncontrolled hypertension ?  Diabetes mellitus type 2 in obese [facility]) ?  GCA (giant cell arteritis) ([place]) ?  Congestive heart failure  (CHF) ([place]) ? ?Acute on chronic HFpEF ?Exacerbation likely secondary to medication nonadherence. Examined the med disp report that shows at refill time extended past recommended refill supporting non-adherence. Echo significant for EF 55 to 60% and G2DD. UOP of 1.8 L in the last 24 hours. Patient still up 7 pounds from admission based on weight patient does not reflect her net I&O of -2.5 L since admission. Will get standing weights. She continues to be on 2 L [place] but this is her baseline. Kidney function stable to improving with diuresing see below. ?--Continue Lasix 80 mg po twice daily ?--Continue compression socks ?--Strict I&O's, daily weights, will get standing weights ?--PT recommending SNF, patient refusing SNF initially but states will go. ?--follow up on pulse ox with ambulation ?  ?Chronic severe hypertension ?Hyperaldosteronism ?Elevated aldosterone/renin ratio.  Uncontrolled blood pressure likely combination of non-adherence and CKD. BP still elevated with SBP in the 150s overnight. Will continue diureses with lasix 80 mg BID ?--Continue spironolactone 100 mg daily ?--Continue metoprolol 50 mg daily ?--Continue amlodipine 10 mg daily ?--Continue diureses with lasix 80 mg BID PO.  ?  ?CKD stage IV ?Unchanged from yesterday.  No electrolyte abnormalities. Responding to diuresis. Will continue to monitor. Her renal fxn expected to deteriorate unless better control over blood pressure achieved. Will continue to re-enforce strict adherence to her regimen.  ?--Avoid nephrotoxic's agent, renally dose medications ?--CTM creatinine on daily  BMP ? ?T2DM ?Chronic. A1c 6.4 in 11/2021. Will repeat A1c. CBG 115 this am.  ?--Continue SSI with meals; pt on resistant scale. Will add mealtime coverage if needed but will await A1c.  ?--CBG monitoring ?  ?General deconditioning ?Hypoalbuminemia ?Albumin of 2.5.  Low albumin likely contributing lower extremity edema. Physical therapy evaluated patient and recommended SNF  placement.  CSW waiting for insurance authorization. ?--Pending insurance authorization for SNF placement ?-Continue compression stockings ? ?Diet: Carb-Modified ?IVF: None ?VTE: Lovenox ?Code: Full ?PT/OT recs: SNF for Subacute PT ?Prior to Admission Living Arrangement:Home ?Anticipated Discharge Location:SNF ?Barriers to Discharge:Authorization ?Dispo: Anticipated discharge in approximately 1 day(s).  ? ?[name], MD ?[name]. [facility] ?Internal Medicine Residency, PGY-1  ?Pager: [phone] ?After 5 pm and on weekends: Please call the on-call pager  ?

## 2022-03-21 NOTE — Progress Notes (Signed)
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 Providers notified. ?

## 2022-03-21 NOTE — Progress Notes (Signed)
SATURATION QUALIFICATIONS: (This note is used to comply with regulatory documentation for home oxygen) ? ?Patient Saturations on Room Air at Rest = 89% ? ?Patient Saturations on Room Air while Ambulating = 82% ? ?Patient Saturations on 2 Liters of oxygen while Ambulating = 93% ? ?Please briefly explain why patient needs home oxygen: Pt hypoxic on RA with exertion, needed 2L O2 [place] to maintain SpO2 >88% with activity ?

## 2022-03-22 DIAGNOSIS — I5033 Acute on chronic diastolic (congestive) heart failure: Secondary | ICD-10-CM | POA: Diagnosis not present

## 2022-03-22 DIAGNOSIS — W19XXXA Unspecified fall, initial encounter: Secondary | ICD-10-CM | POA: Diagnosis not present

## 2022-03-22 LAB — GLUCOSE, CAPILLARY
Glucose-Capillary: 97 mg/dL (ref 70–99)
Glucose-Capillary: 118 mg/dL — ABNORMAL HIGH (ref 70–99)
Glucose-Capillary: 171 mg/dL — ABNORMAL HIGH (ref 70–99)
Glucose-Capillary: 159 mg/dL — ABNORMAL HIGH (ref 70–99)

## 2022-03-22 LAB — CBC WITH DIFFERENTIAL/PLATELET
Abs Immature Granulocytes: 0.03 10*3/uL (ref 0.00–0.07)
Basophils Absolute: 0 10*3/uL (ref 0.0–0.1)
Basophils Relative: 0 %
Eosinophils Absolute: 0.1 10*3/uL (ref 0.0–0.5)
Eosinophils Relative: 2 %
HCT: 34.2 % — ABNORMAL LOW (ref 36.0–46.0)
Hemoglobin: 10.8 g/dL — ABNORMAL LOW (ref 12.0–15.0)
Immature Granulocytes: 1 %
Lymphocytes Relative: 11 %
Lymphs Abs: 0.7 10*3/uL (ref 0.7–4.0)
MCH: 28.7 pg (ref 26.0–34.0)
MCHC: 31.6 g/dL (ref 30.0–36.0)
MCV: 91 fL (ref 80.0–100.0)
Monocytes Absolute: 0.5 10*3/uL (ref 0.1–1.0)
Monocytes Relative: 8 %
Neutro Abs: 4.7 10*3/uL (ref 1.7–7.7)
Neutrophils Relative %: 78 %
Platelets: 170 10*3/uL (ref 150–400)
RBC: 3.76 MIL/uL — ABNORMAL LOW (ref 3.87–5.11)
RDW: 14.8 % (ref 11.5–15.5)
WBC: 5.9 10*3/uL (ref 4.0–10.5)
nRBC: 0 % (ref 0.0–0.2)

## 2022-03-22 LAB — COMPREHENSIVE METABOLIC PANEL
ALT: 12 U/L (ref 0–44)
AST: 14 U/L — ABNORMAL LOW (ref 15–41)
Albumin: 2.5 g/dL — ABNORMAL LOW (ref 3.5–5.0)
Alkaline Phosphatase: 68 U/L (ref 38–126)
Anion gap: 7 (ref 5–15)
BUN: 55 mg/dL — ABNORMAL HIGH (ref 8–23)
CO2: 32 mmol/L (ref 22–32)
Calcium: 7.4 mg/dL — ABNORMAL LOW (ref 8.9–10.3)
Chloride: 99 mmol/L (ref 98–111)
Creatinine, Ser: 3.24 mg/dL — ABNORMAL HIGH (ref 0.44–1.00)
GFR, Estimated: 15 mL/min — ABNORMAL LOW (ref >60)
Glucose, Bld: 94 mg/dL (ref 70–99)
Potassium: 3.9 mmol/L (ref 3.5–5.1)
Sodium: 138 mmol/L (ref 135–145)
Total Bilirubin: 0.6 mg/dL (ref 0.3–1.2)
Total Protein: 5.1 g/dL — ABNORMAL LOW (ref 6.5–8.1)

## 2022-03-22 LAB — HEMOGLOBIN A1C
Hgb A1c MFr Bld: 7.8 % — ABNORMAL HIGH (ref 4.8–5.6)
Mean Plasma Glucose: 177.16 mg/dL

## 2022-03-22 MED ORDER — POLYETHYLENE GLYCOL 3350 17 G PO PACK
17.0000 g | PACK | Freq: Every day | ORAL | Status: DC
  Administered 2022-03-22 – 2022-03-23 (×2): 17 g via ORAL
  Filled 2022-03-22 (×2): qty 1

## 2022-03-22 MED ORDER — METOPROLOL SUCCINATE ER 100 MG PO TB24
100.0000 mg | ORAL_TABLET | Freq: Every evening | ORAL | Status: DC
  Administered 2022-03-22 – 2022-03-23 (×2): 100 mg via ORAL
  Filled 2022-03-22 (×2): qty 1

## 2022-03-22 MED ORDER — SENNOSIDES-DOCUSATE SODIUM 8.6-50 MG PO TABS: 2.0000 | ORAL_TABLET | Freq: Every day | ORAL | Status: DC

## 2022-03-22 MED ORDER — CALCIUM GLUCONATE-NACL 1-0.675 GM/50ML-% IV SOLN
1.0000 g | Freq: Once | INTRAVENOUS | Status: AC
  Administered 2022-03-22: 1000 mg via INTRAVENOUS
  Filled 2022-03-22: qty 50

## 2022-03-22 MED ORDER — SENNOSIDES-DOCUSATE SODIUM 8.6-50 MG PO TABS
2.0000 | ORAL_TABLET | Freq: Once | ORAL | Status: AC
  Administered 2022-03-22: 2 via ORAL
  Filled 2022-03-22: qty 2

## 2022-03-22 NOTE — Progress Notes (Signed)
? ?Alicia [name] is a 71 y.o. with a pertinent PMH of hypertension, CHF, who presented with weakness and fall and admitted for hypertension, hyperglycemia and found to be in CHF exacerbation on hospital day 6. ?Subjective:  ?Overnight: ? ?Patient was seen during rounds today. She reports feeling well. Reports some lightheadedness when getting up. Headache has improved.  ? ? ?Objective: ? ?Vital signs in last 24 hours: ?Vitals:  ? 03/21/22 0716 03/21/22 2255 03/22/22 0100 03/22/22 0410  ?BP: (!) 168/67 (!) 189/71  (!) 168/66  ?Pulse: 71 79    ?Resp: 10 17  (!) 23  ?Temp: 98.7 ?F (37.1 ?C) 99 ?F (37.2 ?C)  98.8 ?F (37.1 ?C)  ?TempSrc: Oral Oral  Oral  ?SpO2: 96% 96%  96%  ?Weight:   120.8 kg   ?Height:      ? ?Supplemental O2: Nasal Cannula ?SpO2: 96 % ?O2 Flow Rate (L/min): 2 L/min ?Filed Weights  ? 03/20/22 0100 03/21/22 0250 03/22/22 0100  ?Weight: 123 kg 121.2 kg 120.8 kg  ? ? ?Intake/Output Summary (Last 24 hours) at 03/22/2022 0724 ?Last data filed at 03/21/2022 2300 ?Gross per 24 hour  ?Intake 700.05 ml  ?Output 900 ml  ?Net -199.95 ml  ? ?Net IO Since Admission: -2,987.76 mL [03/22/22 0724] ?Physical Exam  ?General: NAD ?Head:  NCAT ?Lungs: CTAB, no wheeze ?Cardiovascular: NSR, 2+ radial pulses, 2+ LE edema ?MSK: No asymmetry ?Skin:no lesions on exposed skin ?Neuro: alert and oriented ?Psych: normal mood and normal affect ? ?Diagnostics ?Hgb 10.8. CMP shows creatinine at 3.24 from 3.21 and calcium at 7.4. ? ?Assessment/Plan: ?Alicia [name] is a 71 y.o. with a pertinent PMH of hypertension, CHF, who presented with weakness and fall and admitted for hypertension, hyperglycemia and found to be in CHF exacerbation on hospital day 6. ? ?Principal Problem: ?  Fall ?Active Problems: ?  Severe uncontrolled hypertension ?  Diabetes mellitus type 2 in obese [facility]) ?  GCA (giant cell arteritis) ([place]) ?  Congestive heart failure (CHF) ([place]) ? ?Acute on chronic HFpEF ?Exacerbation likely secondary to medication  nonadherence. Examined the med disp report that shows at refill time extended past recommended refill supporting non-adherence. Echo significant for EF 55 to 60% and G2DD. UOP of 900 cc in the last 24 hours. Patient still up 6 pounds from admission based on weight patient does not reflect her net I&O of -2.9 L since admission. Will get standing weights. She continues to be on 2 L [place] but this is her baseline. Patient able to ambulate with oxygen and maintain saturation.  ?--Hold Lasix 80 mg po twice daily today. Will monitor bicarb and restart with acetazolamide if needed.  ?--Continue compression socks ?--Strict I&O's, daily weights, will get standing weights ?--PT recommending SNF, patient pending SNF ? ? Chronic severe hypertension ?Uncontrolled blood pressure likely combination of non-adherence and CKD vs resistant hypertension. She has not been screened for OSA. This has been considered in the clinic and scheduled for the patient but not performed. Her renin level was low as well and she has multiple reasons to have low renin. One reason is having increased salt intake. Limiting her salt increase will definitely improve her BP.  BP still elevated with SBP in the 160s overnight. Will continue hold diureses as patient reporting orthostatic symptoms.  ?--Will spironolactone 100 mg daily ?--Increase metoprolol to 100 mg daily ?--Continue amlodipine 10 mg daily ?--Will re-enforce sodium's role in her BP and advise her to limit it as much as  possible.  ?  ?CKD stage IV ?Unchanged from yesterday.  No electrolyte abnormalities. Will continue to monitor. Her renal fxn expected to deteriorate unless better control over blood pressure achieved. Will continue to re-enforce strict adherence to her regimen.  ?--Avoid nephrotoxic's agent, renally dose medications ?--CTM creatinine on daily BMP ? ?T2DM ?Chronic. A1c 6.4 in 11/2021. Repeat a1c 7.8. CBG 97 this am.  ?--Continue SSI with meals; pt on resistant scale. ?--CBG  monitoring ?  ?General deconditioning ?Hypoalbuminemia ?Hypocalcemia ?Albumin of 2.5.  Low albumin likely contributing lower extremity edema. Corrected Ca 8.4 yesterday and 1 g given. Today it is 8.6 corrected. Will give 1 g of calcium gluconate today. Given chronic lasix use, she may benefit from oral repletion. Physical therapy evaluated patient and recommended SNF placement.  CSW waiting for insurance authorization. ?--Pending insurance authorization for SNF placement ?--Continue compression stockings ? ?Diet: Carb-Modified ?IVF: None ?VTE: Lovenox ?Code: Full ?PT/OT recs: SNF for Subacute PT ?Prior to Admission Living Arrangement:Home ?Anticipated Discharge Location:SNF ?Barriers to Discharge:Authorization ?Dispo: Anticipated discharge in approximately 1 day(s).  ? ?Alicia Schuller, MD ?[name] ?Internal Medicine Residency, PGY-1  ?Pager: [phone] ?After 5 pm and on weekends: Please call the on-call pager  ?

## 2022-03-22 NOTE — Plan of Care (Signed)

## 2022-03-22 NOTE — Progress Notes (Addendum)
Occupational Therapy Treatment ?Patient Details ?Name: Alicia [name] ?MRN: [id number] ?DOB: 30-Dec-1950 ?Today's Date: 03/22/2022 ? ? ?History of present illness Pt is a 71 y.o. female who presented on 4/24 for temporal artery biopsy but surgery cancelled due to fall, generalized weakness, hyperglycemia, and persistent hypertension. S/P temporal artery biopsy 4/27. PMH: chronic diastolic heart failure, hyperlipidemia, hypertension, peripheral vascular disease, chronic respiratory failure on 2L supplemental oxygen, previous CVA, type II diabetes mellitus, class III obesity, chronic renal insufficiency. ?  ?OT comments ? Chart reviewed, pt greeted in room with niece present throughout tx session. Pt alert and oriented x4, agreeable to OT Tx session. Tx session targeted improving endurance/activity tolerance through ADL task completion in order to facilitate return to PLOF. Pt provided education re: use of AE for LB dressing. Pt also reports she could use dressing stick for UB dressing due to pain in shoulders with UB dressing. 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 Improvements noted in functional mobility as pt is able to amb in room with RW, complete grooming/dressing tasks with SET UP. Sit>supine completed with supervision. Improved awareness and insight into deficits also noted on this date with intermittent vcs required for safety. Vss throughout on 2 L O2 via [place]. Pt is left in bed, NAD, all needs met. OT will continue to follow while admitted.   ? ?Recommendations for follow up therapy are one component of a multi-disciplinary discharge planning process, led by the attending physician.  Recommendations may be updated based on patient status, additional functional criteria and insurance authorization. ?   ?Follow Up Recommendations ? Skilled nursing-short term rehab (<3 hours/day)  ?  ?Assistance Recommended at Discharge Intermittent Supervision/Assistance  ?Patient can return home with the following ? A little  help with walking and/or transfers;A little help with bathing/dressing/bathroom;Assistance with cooking/housework;Assist for transportation;Help with stairs or ramp for entrance ?  ?Equipment Recommendations ?    ?  ?Recommendations for Other Services   ? ?  ?Precautions / Restrictions Precautions ?Precautions: Fall ?Precaution Comments: monitor SpO2 and BP, on 2L O2 during session ?Restrictions ?Weight Bearing Restrictions: No  ? ? ?  ? ?Mobility Bed Mobility ?Overal bed mobility: Needs Assistance ?  ?  ?  ?Supine to sit: Supervision, HOB elevated ?  ?  ?General bed mobility comments: recieved in recliner ?  ? ?Transfers ?Overall transfer level: Needs assistance ?Equipment used: Rolling walker (2 wheels) ?Transfers: Sit to/from Stand ?Sit to Stand: Min guard, Min assist (MIN A 1 attempt, CGA 1 attempt) ?  ?  ?  ?  ?  ?General transfer comment: one vc for hand placement ?  ?  ?Balance Overall balance assessment: Needs assistance ?Sitting-balance support: Feet supported, No upper extremity supported ?Sitting balance-Leahy Scale: Good ?  ?  ?Standing balance support: Bilateral upper extremity supported, During functional activity ?Standing balance-Leahy Scale: Fair ?  ?  ?  ?  ?  ?  ?  ?  ?  ?  ?  ?  ?   ? ?ADL either performed or assessed with clinical judgement  ? ?ADL Overall ADL's : Needs assistance/impaired ?  ?  ?Grooming: Set up;Sitting;Wash/dry face ?  ?Upper Body Bathing: Set up;Sitting ?  ?  ?  ?Upper Body Dressing : Set up;Standing ?Upper Body Dressing Details (indicate cue type and reason): with RW ?Lower Body Dressing: Moderate assistance ?Lower Body Dressing Details (indicate cue type and reason): eduated on AE use for UB/LB dressing ?Toilet Transfer: Min guard;Ambulation;Rolling walker (2 wheels);BSC/3in1 ?  ?  Toileting- Water quality scientist and Hygiene: Min guard;Sit to/from stand ?Toileting - Clothing Manipulation Details (indicate cue type and reason): with RW at bsc ?  ?  ?Functional mobility during  ADLs: Supervision/safety;Min guard (approx 10' in room) ?  ?  ? ?Extremity/Trunk Assessment   ?  ?  ?  ?  ?  ? ?Vision   ?  ?  ?Perception   ?  ?Praxis   ?  ? ?Cognition Arousal/Alertness: Awake/alert ?Behavior During Therapy: [facility] for tasks assessed/performed ?Overall Cognitive Status: Within Functional Limits for tasks assessed ?  ?  ?  ?  ?  ?  ?  ?  ?  ?  ?  ?  ?  ?  ?  ?  ?General Comments: pt with improved compliance and participation in tx tasks. Good one step direction following. Fair-good safety awareness, fair-good insight into deficits ?  ?  ?   ?Exercises   ? ?  ?Shoulder Instructions   ? ? ?  ?General Comments    ? ? ?Pertinent Vitals/ Pain       Pain Assessment ?Pain Assessment: No/denies pain ? ?Home Living   ?  ?  ?  ?  ?  ?  ?  ?  ?  ?  ?  ?  ?  ?  ?  ?  ?  ?  ? ?  ?Prior Functioning/Environment    ?  ?  ?  ?   ? ?Frequency ? Min 2X/week  ? ? ? ? ?  ?Progress Toward Goals ? ?OT Goals(current goals can now be found in the care plan section) ? Progress towards OT goals: Progressing toward goals ? ?Acute Rehab OT Goals ?Patient Stated Goal: get stronger ?OT Goal Formulation: With patient ?Time For Goal Achievement: 04/05/22 ?Potential to Achieve Goals: Good  ?Plan Discharge plan remains appropriate;Frequency remains appropriate   ? ?Co-evaluation ? ? ?   ?  ?  ?  ?  ? ?  ?AM-PAC OT "6 Clicks" Daily Activity     ?Outcome Measure ? ? Help from another person eating meals?: None ?Help from another person taking care of personal grooming?: None ?Help from another person toileting, which includes using toliet, bedpan, or urinal?: A Little ?Help from another person bathing (including washing, rinsing, drying)?: A Little ?Help from another person to put on and taking off regular upper body clothing?: A Little ?Help from another person to put on and taking off regular lower body clothing?: A Lot ?6 Click Score: 19 ? ?  ?End of Session Equipment Utilized During Treatment: Rolling walker (2 wheels);Oxygen ? ?OT  Visit Diagnosis: Other abnormalities of gait and mobility (R26.89);Muscle weakness (generalized) (M62.81);History of falling (Z91.81) ?  ?Activity Tolerance Patient tolerated treatment well ?  ?Patient Left in bed;with call bell/phone within reach;with bed alarm set ?  ?Nurse Communication Mobility status ?  ? ?   ? ?Time: 0569-7948 ?OT Time Calculation (min): 28 min ? ?Charges: OT General Charges ?$OT Visit: 1 Visit ?OT Treatments ?$Self Care/Home Management : 23-37 mins ? ?Shanon Payor, OTD OTR/L  ?03/22/22, 3:04 PM  ?

## 2022-03-22 NOTE — Plan of Care (Signed)
  Problem: Activity: Goal: Risk for activity intolerance will decrease Outcome: Progressing   

## 2022-03-22 NOTE — Progress Notes (Signed)
Mobility Specialist Progress Note: ? ? 03/22/22 1020  ?Mobility  ?Activity Transferred from bed to chair  ?Level of Assistance Minimal assist, patient does 75% or more  ?Assistive Device Front wheel walker  ?Distance Ambulated (ft) 2 ft  ?Activity Response Tolerated well  ?$Mobility charge 1 Mobility  ? ?Pt agreeable to mobility session. Required minA through [facility] to get EOB, elevated bed for standing. Pt left in chair with all needs met.  ? ?Nelta Numbers ?Acute Rehab ?Phone: [phone] ?Office Phone: [phone] ? ?

## 2022-03-22 NOTE — Progress Notes (Signed)
Orthopedic Tech Progress Note ?Patient Details:  ?Alicia [name] ?06/02/1951 ?[id number] ? ?Ortho Devices ?Type of Ortho Device: Unna boot ?Ortho Device/Splint Location: BLE ?Ortho Device/Splint Interventions: Ordered, Application, Adjustment ?  ?Post Interventions ?Patient Tolerated: Well ?Instructions Provided: Care of device ? ?Janit Pagan ?03/22/2022, 3:03 PM ? ?

## 2022-03-23 DIAGNOSIS — I5033 Acute on chronic diastolic (congestive) heart failure: Secondary | ICD-10-CM | POA: Diagnosis not present

## 2022-03-23 LAB — RENAL FUNCTION PANEL
Albumin: 2.5 g/dL — ABNORMAL LOW (ref 3.5–5.0)
Anion gap: 6 (ref 5–15)
BUN: 48 mg/dL — ABNORMAL HIGH (ref 8–23)
CO2: 33 mmol/L — ABNORMAL HIGH (ref 22–32)
Calcium: 7.6 mg/dL — ABNORMAL LOW (ref 8.9–10.3)
Chloride: 102 mmol/L (ref 98–111)
Creatinine, Ser: 2.8 mg/dL — ABNORMAL HIGH (ref 0.44–1.00)
GFR, Estimated: 18 mL/min — ABNORMAL LOW (ref >60)
Glucose, Bld: 109 mg/dL — ABNORMAL HIGH (ref 70–99)
Phosphorus: 3.7 mg/dL (ref 2.5–4.6)
Potassium: 4.1 mmol/L (ref 3.5–5.1)
Sodium: 141 mmol/L (ref 135–145)

## 2022-03-23 LAB — GLUCOSE, CAPILLARY
Glucose-Capillary: 122 mg/dL — ABNORMAL HIGH (ref 70–99)
Glucose-Capillary: 219 mg/dL — ABNORMAL HIGH (ref 70–99)
Glucose-Capillary: 153 mg/dL — ABNORMAL HIGH (ref 70–99)
Glucose-Capillary: 70 mg/dL (ref 70–99)

## 2022-03-23 MED ORDER — FUROSEMIDE 40 MG PO TABS
40.0000 mg | ORAL_TABLET | Freq: Two times a day (BID) | ORAL | 0 refills | Status: DC
Start: 2022-03-23 — End: 2022-05-19

## 2022-03-23 MED ORDER — FUROSEMIDE 40 MG PO TABS
40.0000 mg | ORAL_TABLET | Freq: Two times a day (BID) | ORAL | Status: DC
  Administered 2022-03-23 (×2): 40 mg via ORAL
  Filled 2022-03-23 (×2): qty 1

## 2022-03-23 MED ORDER — SPIRONOLACTONE 100 MG PO TABS: 100.0000 mg | ORAL_TABLET | Freq: Every day | ORAL | 0 refills | Status: DC

## 2022-03-23 MED ORDER — METOPROLOL SUCCINATE ER 100 MG PO TB24
100.0000 mg | ORAL_TABLET | Freq: Every day | ORAL | 0 refills | Status: DC
Start: 2022-03-23 — End: 2022-05-19

## 2022-03-23 NOTE — Discharge Summary (Signed)
? ?Name: Alicia [name] ?MRN: [id number] ?DOB: 07-02-1951 71 y.o. ?PCP: Rick Duff, MD ? ?Date of Admission: 03/14/2022  5:25 AM ?Date of Discharge: 03/23/22  ?Attending Physician: Sid Falcon, MD ? ?Discharge Diagnosis: ?Fall 2/2 to Generalized Deconditioning ?Acute on Chronic CHF exacerbation ?Severe uncontrolled hypertension ?AKI on CKD Stage IV ?Uncontrolled Diabetes Mellitus II ?Obesity Class III ?HLD ?Hypoalbuminemia ?Hypocalcemia ?Non-intractable headache ? ?Discharge Medications: ?Allergies as of 03/23/2022   ? ?   Reactions  ? Nifedipine Er Other (See Comments)  ? Caused nose bleeds in higher doses (90 mg., namely)  ? Atorvastatin Swelling  ? Lip swelling to lipitor ?Patient tolerates crestor  ? Latex Rash  ? ?  ? ?  ?Medication List  ?  ? ?STOP taking these medications   ? ?predniSONE 20 MG tablet ?Commonly known as: DELTASONE ?  ? ?  ? ?TAKE these medications   ? ?acetaminophen 650 MG CR tablet ?Commonly known as: TYLENOL ?Take 1,300 mg by mouth every 8 (eight) hours as needed for pain. ?  ?albuterol 108 (90 Base) MCG/ACT inhaler ?Commonly known as: VENTOLIN HFA ?Inhale 2 puffs into the lungs every 6 (six) hours as needed for wheezing or shortness of breath. ?  ?amLODipine 10 MG tablet ?Commonly known as: NORVASC ?Take 1 tablet (10 mg total) by mouth daily. ?  ?aspirin 81 MG EC tablet ?Take 1 tablet (81 mg total) by mouth daily. ?What changed: when to take this ?  ?blood glucose meter kit and supplies ?per insurance preference. Check cbg once a day. Dx E11.9 ?  ?diclofenac Sodium 1 % Gel ?Commonly known as: VOLTAREN ?Apply 2 g topically 3 (three) times daily as needed (Pain). ?  ?furosemide 40 MG tablet ?Commonly known as: LASIX ?Take 1 tablet (40 mg total) by mouth 2 (two) times daily. ?What changed: Another medication with the same name was removed. Continue taking this medication, and follow the directions you see here. ?  ?metoprolol succinate 100 MG 24 hr tablet ?Commonly known as:  TOPROL-XL ?Take 1 tablet (100 mg total) by mouth daily. Take with or immediately following a meal. ?What changed:  ?medication strength ?how much to take ?  ?nicotine 14 mg/24hr patch ?Commonly known as: NICODERM CQ - dosed in mg/24 hours ?Place 1 patch (14 mg total) onto the skin daily. ?  ?OXYGEN ?Inhale 2 L into the lungs as needed (oxygen). ?  ?rosuvastatin 10 MG tablet ?Commonly known as: CRESTOR ?Take 1 tablet (10 mg total) by mouth daily. ?What changed: when to take this ?  ?sitaGLIPtin 25 MG tablet ?Commonly known as: Januvia ?Take 1 tablet (25 mg total) by mouth daily. ?  ?spironolactone 100 MG tablet ?Commonly known as: ALDACTONE ?Take 1 tablet (100 mg total) by mouth daily. ?What changed:  ?medication strength ?how much to take ?  ? ?  ? ? ?Disposition and follow-up:   ?Alicia [name] was discharged from [facility] in Good condition.  At the hospital follow up visit please address: ? ?1. HTN/HFpEF: Blood pressure uncontrolled. Regimen adjusted, please adjust regimen as needed for tighter bp control. Consider Imdur or SGLT2 inhibitor if appropriate. If appropriate consider switching to Coreg instead of metoprolol for better bp control. Follow up on sleep study.  ?2. AKI on CKD IV: Patient had worsening creatinine function 2/2 to problem 1. Her renal fxn improved with diuresis. Her Creatinine was 2.8 at day of discharge. Please follow renal fxn. ?3. Sleep Study: Ensure patient has sleep study completed.  It has been ordered but patient has not completed it.  ?4. Hypocalcemia: Start oral calcium if appropriate given low calcium level and risk of fall.  ?2.  Labs / imaging needed at time of follow-up: CBC, BMP, Vitamin D ? ?3.  Pending labs/ test needing follow-up: None ? ?Follow-up Appointments: ? Follow-up Information   ? ? Rick Duff, MD. [name] on 03/28/2022.   ?Specialty: Student ?Why: _0 :15am ?Contact information: ?[address] ?[place] [address] ?[phone] ? ? ?  ?   ? ?  ?  ? ?  ? ? ?Hospital Course by problem list: ?HPI per Dr. Elliot Gurney ?Patient is a 71 year old with a history of difficult to control hypertension who was sent to the emergency department for elevated blood pressure and blood sugar after presenting for temporal artery biopsy. ?  ?Yesterday she slid out of the shower. Did not lose consciousness or hit her head. Has periodic chest pain that she describes as gassy. It doesn't happen daily.  Does endorse episode of chest pain prior to falling. Does endorse chronic issues with balance as well. When she went to the floor, she was unable to get up. She inched her way to the bedroom, she couldn't lift herself up onto the bed. She feels weak and unable to get up. On the ground for 4 hours. She has had diffuse weakness for some time and believes it is due to the medicines. She only takes medicines once a day at most.  Did not take any medicines last night. She does complain of left leg weakness, but states that this has been present for months.  Also notes that both of her legs have been swollen for some time now. Lightheadedness and dizziness, not getting better. She is also getting short of breath easily.  She is worried to use her cane and has to use her walker. She feels as though the weakness has generally worsened. She is unable to go out and do things like grocery shop anymore.  States it takes her up to 2 hours to get dressed in the morning.  She must stop due to weakness when ambulating very short distances. ?  ?Acute on chronic HFpEF ?Exacerbation likely secondary to medication nonadherence. Examined the med disp report that shows at refill time extended past recommended refill supporting non-adherence. Echo significant for EF 55 to 60% and G2DD. Marland Kitchen Patient weight not congruent with with diuresis  but she was -3.7 L since admission. Standing weights ordered which showed decline but not able to compare with admission weight. She is satting well on 1-2 L [place] but  this is her baseline. Patient was able to ambulate with oxygen and maintain saturation. She was diuresed with lasix 80 mg BID but that was changed to once a day on the day prior to discharge and will restart her home regimen at discharge of Lasix 40 mg BID.  ? ?  ?Chronic severe hypertension ?Uncontrolled blood pressure likely combination of non-adherence and CKD vs resistant hypertension. She has not been screened for OSA. This has been considered in the clinic and scheduled for the patient but not performed. She had previous evaluation for hyperaldosteronism and although her ratio was elevated the aldosterone level was normal. She had a significantly low renin level. She has multiple reasons to have low renin, including use of a beta and calcium checking blocker. One other reason is having increased salt intake. Limiting her salt increase will definitely improve her BP.  She was counseled on  low salt intake. Her metoprolol and spirinolactone was increased to 100 mg each. She is also on amlodipine and lasix. SGLT2 inhibitor can be considered if her kidney fxn allows but her GFR was <20 during this hospitalization. Imdur can also be considered in the outpatient setting given her HF and HTN. This was not done as metoprolol was increased from 50 mg to 100 mg the day before discharge and wanted to quantify the effect of the change before making an addition.  ?  ?  ?AKI on CKD stage IV (Resolved) ?Baseline appears around 2.5. On presentation 2.7 but went to 3.2 during the hospitalization. Her electrolytes were normal. She received increased dosage of lasix with 80 mg BID oral for a brief period. Her renal fxn was 2.8 at the day of discharge. Her renal fxn expected to deteriorate unless better control over blood pressure achieved. We reinforced strict adherence to her regimen.  ?  ?T2DM ?Chronic. A1c 6.4 in 11/2021. Repeat a1c 7.8. CBG 122 morning glucose on day of discharge. Will discharge on home regimen.  ? ?  ?Fall  2/2 General deconditioning ?Hypoalbuminemia ?Hypocalcemia ?Weakness due to overall deconditioning. Her steroid use may have contributed to the weakness as well. Her albumin of 2.5.  Low albumin likely contri

## 2022-03-23 NOTE — Progress Notes (Signed)
Report was called to nurse Caryl Ada at [facility]. [phone]. Pt is alert and oriented x 4, needs on person assist out of bed and to the toilet. Pt last set of vital signs were taken and recorded by student nurse as TPR 98.8 F orally, 71, 18 and 186/67 and 95 percent on 2 L of oxygen. Blood pressure medications given. ? ?Education and care plan completed and discharge teaching given to patient as well as a packet for EMT.                                                                                                                                                                                                                            ?

## 2022-03-23 NOTE — Plan of Care (Signed)
?  Problem: Education: ?Goal: Knowledge of General Education information will improve ?Description: Including pain rating scale, medication(s)/side effects and non-pharmacologic comfort measures ?Outcome: Progressing ?  ?Problem: Health Behavior/Discharge Planning: ?Goal: Ability to manage health-related needs will improve ?Outcome: Progressing ?  ?Problem: Clinical Measurements: ?Goal: Ability to maintain clinical measurements within normal limits will improve ?Outcome: Progressing ?Goal: Will remain free from infection ?Outcome: Progressing ?Goal: Diagnostic test results will improve ?Outcome: Progressing ?Goal: Respiratory complications will improve ?Outcome: Progressing ?Goal: Cardiovascular complication will be avoided ?Outcome: Progressing ?  ?Problem: Activity: ?Goal: Risk for activity intolerance will decrease ?Outcome: Progressing ?  ?Problem: Coping: ?Goal: Level of anxiety will decrease ?Outcome: Progressing ?  ?Problem: Elimination: ?Goal: Will not experience complications related to bowel motility ?Outcome: Progressing ?Goal: Will not experience complications related to urinary retention ?Outcome: Progressing ?  ?Problem: Pain Managment: ?Goal: General experience of comfort will improve ?Outcome: Progressing ?  ?Problem: Safety: ?Goal: Ability to remain free from injury will improve ?Outcome: Progressing ?  ?Problem: Skin Integrity: ?Goal: Risk for impaired skin integrity will decrease ?Outcome: Progressing ?  ?Problem: Education: ?Goal: Ability to demonstrate management of disease process will improve ?Outcome: Progressing ?Goal: Ability to verbalize understanding of medication therapies will improve ?Outcome: Progressing ?Goal: Individualized Educational Video(s) ?Outcome: Progressing ?  ?Problem: Activity: ?Goal: Capacity to carry out activities will improve ?Outcome: Progressing ?  ?Problem: Cardiac: ?Goal: Ability to achieve and maintain adequate cardiopulmonary perfusion will improve ?Outcome:  Progressing ?  ?Problem: Education: ?Goal: Ability to demonstrate management of disease process will improve ?Outcome: Progressing ?Goal: Ability to verbalize understanding of medication therapies will improve ?Outcome: Progressing ?Goal: Individualized Educational Video(s) ?Outcome: Progressing ?  ?Problem: Activity: ?Goal: Capacity to carry out activities will improve ?Outcome: Progressing ?  ?Problem: Cardiac: ?Goal: Ability to achieve and maintain adequate cardiopulmonary perfusion will improve ?Outcome: Progressing ?  ?

## 2022-03-23 NOTE — TOC Transition Note (Signed)
Transition of Care (TOC) - CM/SW Discharge Note ? ? ?Patient Details  ?Name: Alicia [name] ?MRN: [id number] ?Date of Birth: 08/15/1951 ? ?Transition of Care (TOC) CM/SW Contact:  ?Reece Agar, LCSWA ?Phone Number: ?03/23/2022, 3:55 PM ? ? ?Clinical Narrative:    ?Patient will DC to: [facility] ?Anticipated DC date: 03/23/2022 ?Family notified: Pt asked not to contact ?Transport by: Corey Harold ? ? ?Per MD patient ready for DC to [facility] care room 128. RN to call report prior to discharge [phone]). RN, patient, patient's family, and facility notified of DC. Discharge Summary and FL2 sent to facility. DC packet on chart. Ambulance transport requested for patient.  ? ?CSW will sign off for now as social work intervention is no longer needed. Please consult [place] again if new needs arise. ?  ? ? ?Final next level of care: [facility] ?Barriers to Discharge: Continued Medical Work up ? ? ?Patient Goals and CMS Choice ?Patient states their goals for this hospitalization and ongoing recovery are:: Rehab at [facility] or Home ?CMS Medicare.gov Compare Post Acute Care list provided to:: Patient ?Choice offered to / list presented to : Patient ? ?Discharge Placement ?  ?           ?  ?  ?  ?  ? ?Discharge Plan and Services ?In-house Referral: Clinical Social Work ?  ?Post Acute Care Choice: [place]          ?  ?  ?  ?  ?  ?  ?  ?  ?  ?  ? ?Social Determinants of Health (SDOH) Interventions ?  ? ? ?Readmission Risk Interventions ? ?  03/02/2020  ?  1:08 PM  ?Readmission Risk Prevention Plan  ?Transportation Screening Complete  ?PCP or Specialist Appt within 5-7 Days Complete  ?Home Care Screening Complete  ?Medication Review (RN CM) Complete  ? ? ? ? ? ?

## 2022-03-23 NOTE — Progress Notes (Signed)
? ?Alicia [name] is a 71 y.o. with a pertinent PMH of hypertension, CHF, who presented with weakness and fall and admitted for hypertension, hyperglycemia and found to be in CHF exacerbation on hospital day 7. ?Subjective:  ?Overnight: NAEON. ? ?Pt is seen during rounds today. She reports feeling better. She has no complaints or concerns at this time. She is updated on the plan for today and all questions are concerned.  ? ? ?Objective: ? ?Vital signs in last 24 hours: BP systolic in 390Z to 009Q.  ?Vitals:  ? 03/22/22 3300 03/22/22 1118 03/22/22 2126 03/23/22 0618  ?BP:  (!) 164/74 (!) 174/73 (!) 163/71  ?Pulse:  71 66 77  ?Resp:  '20 19 15  '$ ?Temp: 98.4 ?F (36.9 ?C) 98.4 ?F (36.9 ?C) 98.2 ?F (36.8 ?C) 99.1 ?F (37.3 ?C)  ?TempSrc: Oral Oral Oral Oral  ?SpO2: 98% 97% 93% 92%  ?Weight:      ?Height:      ? ?Supplemental O2: Nasal Cannula ?SpO2: 92 % ?O2 Flow Rate (L/min): 2 L/min ?Filed Weights  ? 03/20/22 0100 03/21/22 0250 03/22/22 0100  ?Weight: 123 kg 121.2 kg 120.8 kg  ? ? ?Intake/Output Summary (Last 24 hours) at 03/23/2022 7622 ?Last data filed at 03/23/2022 6333 ?Gross per 24 hour  ?Intake 1123.97 ml  ?Output 1800 ml  ?Net -676.03 ml  ? ? ?Net IO Since Admission: -3,663.79 mL [03/23/22 0625] ?Physical Exam  ?General: NAD ?Head:  NCAT ?Lungs: CTAB, no wheeze, satting well on 1 L ?Cardiovascular: NSR, 2+ radial pulses ?MSK: No asymmetry, bilateral unna boots ?Skin:no lesions on exposed skin ?Neuro: alert and oriented ?Psych: normal mood and normal affect ? ?Diagnostics ?Hgb 10.8 on 5/02 and has been stable.Creatinine at 2.8 from 3.2 and calcium at 7.6 with albumin at 2.5. Bicarb 33. Normal Na, K, and Cl.  ? ?Assessment/Plan: ?Alicia [name] is a 71 y.o. with a pertinent PMH of hypertension, CHF, who presented with weakness and fall and admitted for uncontrolled hypertension, hyperglycemia and found to be in CHF exacerbation on hospital day 7. ? ?Acute on chronic HFpEF ?Exacerbation likely secondary to  medication nonadherence. Overall, patient feels improved since admission. Examined the med disp report that shows at refill time extended past recommended refill supporting non-adherence. Echo significant for EF 55 to 60% and G2DD. UOP of 1.8 cc in the last 24 hours. She continues to be on 2 L [place] but this is her baseline. Patient able to ambulate with oxygen and maintain saturation.  ?--Will start home Lasix 40 mg po twice daily today.  ?--Continue compression socks ?--Strict I&O's, will get standing daily weights ?--PT recommending SNF, patient pending SNF ? ?Chronic severe hypertension ?Uncontrolled blood pressure likely combination of non-adherence and CKD vs resistant hypertension. She has not been screened for OSA. This has been considered in the clinic and scheduled for the patient but not performed. IF she remains here overnight, we will order CPAP for her. Will recommend adding SGLT2 inhibitor as outpatient once renal fxn improves. Metoprolol was increased yesterday and if BP does not improve will consider adding IMDUR.  ?--Will spironolactone 100 mg daily ?--Increase metoprolol to 100 mg daily ?--Continue amlodipine 10 mg daily ?--Will re-enforce sodium's role in her BP and advise her to limit it as much as possible.  ?  ?CKD stage IV ?Improved.  Closer to baseline. Will continue to re-enforce strict adherence to her regimen to preserve renal fxn.  ?--Avoid nephrotoxic's agent, renally dose medications ?--CTM creatinine on daily  BMP ? ?T2DM ?Chronic. A1c 6.4 in 11/2021. Repeat a1c 7.8. CBG 122 this am.  ?--Continue SSI with meals; pt on resistant scale. ?--CBG monitoring ?  ?General deconditioning ?Hypoalbuminemia ?Hypocalcemia ?Albumin of 2.5.  Low albumin likely contributing lower extremity edema. Corrected Ca 8.4 yesterday and 1 g given. Today it is 8.8 corrected. Given chronic lasix use, she may benefit from oral repletion. Physical therapy evaluated patient and recommended SNF placement.  CSW waiting  for insurance authorization. ?--Check Vit D given CKD, hypocalcemia, and hx of fall.  ?--Pending insurance authorization for SNF placement ?--Continue compression stockings ? ?Diet: Carb-Modified ?IVF: None ?VTE: Lovenox ?Code: Full ?PT/OT recs: SNF for Subacute PT ?Prior to Admission Living Arrangement:Home ?Anticipated Discharge Location:SNF ?Barriers to Discharge:Authorization ?Dispo: Anticipated discharge in approximately 1 day(s).  ? ?[name], MD ?[name] ?Internal Medicine Residency, PGY-1  ?Pager: [phone] ?After 5 pm and on weekends: Please call the on-call pager  ?

## 2022-03-23 NOTE — Progress Notes (Signed)
Patient has had the pneumonia vaccine for this season/year. Not needed at discharge. ?

## 2022-03-23 NOTE — Progress Notes (Addendum)
Assessment completed on patient. RLE is cooler to the touch than the LLE. Both LE are wrapped in unna boots. Pt denies pain.  ?Before admission pt was able to do for herself at home but it took a long time. She is able to get to her PCP appointments by driving herself or her neighbor will take her. She does live alone. ? ? ?

## 2022-03-23 NOTE — Care Management Important Message (Signed)
Important Message ? ?Patient Details  ?Name: Alicia [name] ?MRN: [id number] ?Date of Birth: 02-Jun-1951 ? ? ?Medicare Important Message Given:  Yes ? ? ? ? ?[name] ?03/23/2022, 8:40 AM ?

## 2022-03-23 NOTE — Progress Notes (Addendum)
Physical Therapy Treatment ?Patient Details ?Name: Alicia [name] ?MRN: [id number] ?DOB: October 23, 1951 ?Today's Date: 03/23/2022 ? ? ?History of Present Illness Pt is a 71 y.o. female who presented on 4/24 for temporal artery biopsy but surgery cancelled due to fall, generalized weakness, hyperglycemia, and persistent hypertension. S/P temporal artery biopsy 4/27. PMH: chronic diastolic heart failure, hyperlipidemia, hypertension, peripheral vascular disease, chronic respiratory failure on 2L supplemental oxygen, previous CVA, type II diabetes mellitus, class III obesity, chronic renal insufficiency. ? ?  ?PT Comments  ? ? Pt received in supine, agreeable to physical therapy session. Pt has made progress toward goals, exhibiting increased activity tolerance and ambulation distance. Pt SpO2 at 92% on RA resting in chair, but required 3L O2  to maintain 92% and greater with exertion. Pt required modA +2 to stand from recliner height chair with cuing for proper technique and hand placement. Pt ambulated in the room to [facility], The min guard assist then ambulated 48 feet in the hallway with RW. Pt with poor carryover of safe hand placement with transfer training and poor attention to task requiring repetition of safety cues throughout session. Pt left in chair with chair alarm set and call bell within reach. She would need 24/7 supervision and physical assist +2 if home for standing transfers if she instead goes home from hospital.  ?Recommendations for follow up therapy are one component of a multi-disciplinary discharge planning process, led by the attending physician.  Recommendations may be updated based on patient status, additional functional criteria and insurance authorization. ? ?Follow Up Recommendations ? Skilled nursing-short term rehab (<3 hours/day) ?  ?  ?Assistance Recommended at Discharge Intermittent Supervision/Assistance  ?Patient can return home with the following A little help with  bathing/dressing/bathroom;Assistance with cooking/housework;Assist for transportation;Help with stairs or ramp for entrance;A little help with walking and/or transfers ?  ?Equipment Recommendations ? None recommended by PT  ?  ?   ?Precautions / Restrictions Precautions ?Precautions: Fall ?Precaution Comments: monitor SpO2 and BP, on 2L O2 during session ?Restrictions ?Weight Bearing Restrictions: No  ?  ? ?Mobility ?  ?  ?Transfers ?Overall transfer level: Needs assistance ?Equipment used: Rolling walker (2 wheels) ?Transfers: Sit to/from Stand ?Sit to Stand: Mod assist, +2 physical assistance ?  ?  ?  ?  ?  ?General transfer comment: pt rocks back and forth 2-3 times to gain momentum and requires modA +2 to stand using RW for UE support with verbal cues for hand placement. Pt requiring modA +1 sit<>stand from [facility] to RW. ?  ? ?Ambulation/Gait ?Ambulation/Gait assistance: Min guard ?Gait Distance (Feet): 48 Feet ?Assistive device: Rolling walker (2 wheels) ?Gait Pattern/deviations: Step-through pattern, Decreased stride length ?Gait velocity: reduced ?  ?  ?General Gait Details: slow, small steps with heavy reliance on BUE support from recliner<>BSC. SpO2 stable on 3L with exertion (92%), Distance limited due to fatigue. ? ? ? ? ?  ?Balance Overall balance assessment: Needs assistance ?Sitting-balance support: Feet supported, No upper extremity supported ?Sitting balance-Leahy Scale: Good ?  ?  ?Standing balance support: Bilateral upper extremity supported, During functional activity ?  ?Standing balance comment: relies on RW ?  ?  ?  ?  ? ?  ?Cognition Arousal/Alertness: Awake/alert ?Behavior During Therapy: [facility] for tasks assessed/performed ?Overall Cognitive Status: Within Functional Limits for tasks assessed ?Area of Impairment: Safety/judgement, Problem solving, Memory ?  ?  ?  ?  ?Memory: Decreased short-term memory ?  ?Safety/Judgement: Decreased awareness of safety ?  ?Problem Solving: Difficulty sequencing,  Requires verbal cues ?General Comments: pt with improved compliance and participation in tx tasks. Good one step direction following. Fair-good safety awareness, fair-good insight into deficits ?  ?  ? ?  ?Exercises Other Exercises ?Other Exercises: STS x 5 reps (reciprocal but with increased time/rest breaks due to DOE) ? ?  ?General Comments General comments (skin integrity, edema, etc.): BP monitored, increased from 159/64 to 194/73 with ambulation. ?  ?  ? ?Pertinent Vitals/Pain Pain Assessment ?Pain Assessment: No/denies pain ?Pain Score: 0-No pain  ? ? ?   ?   ? ?PT Goals (current goals can now be found in the care plan section) Acute Rehab PT Goals ?Patient Stated Goal: to return to independence and go home ?PT Goal Formulation: With patient ?Time For Goal Achievement: 03/29/22 ?Potential to Achieve Goals: Good ?Progress towards PT goals: Progressing toward goals ? ?  ?   ?PT Plan Current plan remains appropriate  ? ? ?   ?AM-PAC PT "6 Clicks" Mobility   ?Outcome Measure ? Help needed turning from your back to your side while in a flat bed without using bedrails?: A Little ?Help needed moving from lying on your back to sitting on the side of a flat bed without using bedrails?: A Lot ?Help needed moving to and from a bed to a chair (including a wheelchair)?: A Little ?Help needed standing up from a chair using your arms (e.g., wheelchair or bedside chair)?: A Lot ?Help needed to walk in hospital room?: A Little ?Help needed climbing 3-5 steps with a railing? : Total ?6 Click Score: 14 ? ?  ?End of Session Equipment Utilized During Treatment: Gait belt;Oxygen ?Activity Tolerance: Patient tolerated treatment well ?Patient left: with call bell/phone within reach;in chair;with chair alarm set ?Nurse Communication: Mobility status ?PT Visit Diagnosis: Muscle weakness (generalized) (M62.81);Difficulty in walking, not elsewhere classified (R26.2);Unsteadiness on feet (R26.81);Other abnormalities of gait and mobility  (R26.89);History of falling (Z91.81) ?  ? ? ?Time: 9528-4132 ?PT Time Calculation (min) (ACUTE ONLY): 18 min ? ?Charges:  $Gait Training: 8-22 mins          ?          ?Wadie Lessen, SPTA ? ?Tiffany Mutch ?03/23/2022, 12:38 PM ? ?

## 2022-03-23 NOTE — Discharge Instructions (Signed)
Alicia [name], It was a pleasure taking care of you! ? ?You presented to the ED after a fall and were found to be in a heart failure exacerbation. Your blood pressure was significantly elevated and you had extra fluid on your body. We changed some of your blood pressure medications and gave you increased strength of your diuretic lasix and removed the extra fluid. Physical therapist saw you and recommend skilled nursing facility for to help you regain the strength. Your blood pressures are doing better on the new regimen but they are not at goal. We want you to continue follow up with our clinic so we can get them to goal. Your blood pressures medications are listed below.  ?Spironolactone 100 mg daily ?Amlodipine 10 mg daily ?Metoprolol succinate 100 mg daily ?Lasix 40 mg daily ? ?Please continue the above medications as stated. Sleep apnea may be causing your blood pressures to be uncontrolled. Order has been placed. Please complete that as soon as possible. Our clinic will help you get that scheduled if needed.  ?We look forward to seeing you! ? ?

## 2022-03-23 NOTE — Progress Notes (Signed)
Mobility Specialist Progress Note: ? ? 03/23/22 1030  ?Mobility  ?Activity Ambulated with assistance in hallway  ?Level of Assistance Minimal assist, patient does 75% or more  ?Assistive Device Front wheel walker  ?Distance Ambulated (ft) 50 ft  ?Activity Response Tolerated fair  ?$Mobility charge 1 Mobility  ? ? ?During Mobility: SpO2 80% 1LO2 ?Post Mobility: SpO2 89% 2LO2 ? ?Pt agreeable to mobility session. Required minA to stand, minG during ambulation. Pt requiring 2LO2 to maintain SpO2 >89%. Pt left in chair with all needs met.  ? ?Nelta Numbers ?Acute Rehab ?Phone: [phone] ?Office Phone: [phone] ? ?

## 2022-03-24 DIAGNOSIS — L304 Erythema intertrigo: Secondary | ICD-10-CM | POA: Diagnosis not present

## 2022-03-24 DIAGNOSIS — M25569 Pain in unspecified knee: Secondary | ICD-10-CM | POA: Diagnosis not present

## 2022-03-24 DIAGNOSIS — M25512 Pain in left shoulder: Secondary | ICD-10-CM | POA: Diagnosis not present

## 2022-03-24 DIAGNOSIS — B379 Candidiasis, unspecified: Secondary | ICD-10-CM | POA: Diagnosis not present

## 2022-03-24 DIAGNOSIS — M6281 Muscle weakness (generalized): Secondary | ICD-10-CM | POA: Diagnosis not present

## 2022-03-24 DIAGNOSIS — R29898 Other symptoms and signs involving the musculoskeletal system: Secondary | ICD-10-CM | POA: Diagnosis not present

## 2022-03-24 DIAGNOSIS — M316 Other giant cell arteritis: Secondary | ICD-10-CM | POA: Diagnosis not present

## 2022-03-24 DIAGNOSIS — R262 Difficulty in walking, not elsewhere classified: Secondary | ICD-10-CM | POA: Diagnosis not present

## 2022-03-24 DIAGNOSIS — I5033 Acute on chronic diastolic (congestive) heart failure: Secondary | ICD-10-CM | POA: Diagnosis not present

## 2022-03-24 DIAGNOSIS — M2550 Pain in unspecified joint: Secondary | ICD-10-CM | POA: Diagnosis not present

## 2022-03-24 DIAGNOSIS — W19XXXD Unspecified fall, subsequent encounter: Secondary | ICD-10-CM | POA: Diagnosis not present

## 2022-03-24 DIAGNOSIS — J449 Chronic obstructive pulmonary disease, unspecified: Secondary | ICD-10-CM | POA: Diagnosis not present

## 2022-03-24 DIAGNOSIS — I5030 Unspecified diastolic (congestive) heart failure: Secondary | ICD-10-CM | POA: Diagnosis not present

## 2022-03-24 DIAGNOSIS — R5381 Other malaise: Secondary | ICD-10-CM | POA: Diagnosis not present

## 2022-03-24 DIAGNOSIS — I639 Cerebral infarction, unspecified: Secondary | ICD-10-CM | POA: Diagnosis not present

## 2022-03-24 DIAGNOSIS — M79643 Pain in unspecified hand: Secondary | ICD-10-CM | POA: Diagnosis not present

## 2022-03-24 DIAGNOSIS — M79642 Pain in left hand: Secondary | ICD-10-CM | POA: Diagnosis not present

## 2022-03-24 DIAGNOSIS — E119 Type 2 diabetes mellitus without complications: Secondary | ICD-10-CM | POA: Diagnosis not present

## 2022-03-24 DIAGNOSIS — I509 Heart failure, unspecified: Secondary | ICD-10-CM | POA: Diagnosis not present

## 2022-03-24 DIAGNOSIS — B370 Candidal stomatitis: Secondary | ICD-10-CM | POA: Diagnosis not present

## 2022-03-24 DIAGNOSIS — I10 Essential (primary) hypertension: Secondary | ICD-10-CM | POA: Diagnosis not present

## 2022-03-24 DIAGNOSIS — M79601 Pain in right arm: Secondary | ICD-10-CM | POA: Diagnosis not present

## 2022-03-24 DIAGNOSIS — K30 Functional dyspepsia: Secondary | ICD-10-CM | POA: Diagnosis not present

## 2022-03-24 DIAGNOSIS — Z7401 Bed confinement status: Secondary | ICD-10-CM | POA: Diagnosis not present

## 2022-03-24 DIAGNOSIS — M25511 Pain in right shoulder: Secondary | ICD-10-CM | POA: Diagnosis not present

## 2022-03-24 DIAGNOSIS — F331 Major depressive disorder, recurrent, moderate: Secondary | ICD-10-CM | POA: Diagnosis not present

## 2022-03-24 DIAGNOSIS — J069 Acute upper respiratory infection, unspecified: Secondary | ICD-10-CM | POA: Diagnosis not present

## 2022-03-24 DIAGNOSIS — G441 Vascular headache, not elsewhere classified: Secondary | ICD-10-CM | POA: Diagnosis not present

## 2022-03-24 DIAGNOSIS — F419 Anxiety disorder, unspecified: Secondary | ICD-10-CM | POA: Diagnosis not present

## 2022-03-25 DIAGNOSIS — E119 Type 2 diabetes mellitus without complications: Secondary | ICD-10-CM | POA: Diagnosis not present

## 2022-03-25 DIAGNOSIS — B379 Candidiasis, unspecified: Secondary | ICD-10-CM | POA: Diagnosis not present

## 2022-03-25 DIAGNOSIS — I5030 Unspecified diastolic (congestive) heart failure: Secondary | ICD-10-CM | POA: Diagnosis not present

## 2022-03-25 DIAGNOSIS — I10 Essential (primary) hypertension: Secondary | ICD-10-CM | POA: Diagnosis not present

## 2022-03-25 DIAGNOSIS — I5033 Acute on chronic diastolic (congestive) heart failure: Secondary | ICD-10-CM | POA: Diagnosis not present

## 2022-03-28 ENCOUNTER — Encounter: Payer: Medicare HMO | Admitting: Internal Medicine

## 2022-03-29 DIAGNOSIS — I10 Essential (primary) hypertension: Secondary | ICD-10-CM | POA: Diagnosis not present

## 2022-03-29 DIAGNOSIS — B379 Candidiasis, unspecified: Secondary | ICD-10-CM | POA: Diagnosis not present

## 2022-03-29 DIAGNOSIS — I5030 Unspecified diastolic (congestive) heart failure: Secondary | ICD-10-CM | POA: Diagnosis not present

## 2022-03-29 DIAGNOSIS — I5033 Acute on chronic diastolic (congestive) heart failure: Secondary | ICD-10-CM | POA: Diagnosis not present

## 2022-03-29 DIAGNOSIS — B370 Candidal stomatitis: Secondary | ICD-10-CM | POA: Diagnosis not present

## 2022-03-29 DIAGNOSIS — E119 Type 2 diabetes mellitus without complications: Secondary | ICD-10-CM | POA: Diagnosis not present

## 2022-03-30 ENCOUNTER — Encounter: Payer: Self-pay | Admitting: Student

## 2022-03-30 DIAGNOSIS — B370 Candidal stomatitis: Secondary | ICD-10-CM | POA: Diagnosis not present

## 2022-03-30 DIAGNOSIS — I5033 Acute on chronic diastolic (congestive) heart failure: Secondary | ICD-10-CM | POA: Diagnosis not present

## 2022-03-30 DIAGNOSIS — K30 Functional dyspepsia: Secondary | ICD-10-CM | POA: Diagnosis not present

## 2022-03-30 DIAGNOSIS — R5381 Other malaise: Secondary | ICD-10-CM | POA: Diagnosis not present

## 2022-03-30 DIAGNOSIS — I5030 Unspecified diastolic (congestive) heart failure: Secondary | ICD-10-CM | POA: Diagnosis not present

## 2022-03-30 DIAGNOSIS — M25569 Pain in unspecified knee: Secondary | ICD-10-CM | POA: Diagnosis not present

## 2022-03-30 DIAGNOSIS — I10 Essential (primary) hypertension: Secondary | ICD-10-CM | POA: Diagnosis not present

## 2022-03-30 DIAGNOSIS — M6281 Muscle weakness (generalized): Secondary | ICD-10-CM | POA: Diagnosis not present

## 2022-03-30 DIAGNOSIS — R262 Difficulty in walking, not elsewhere classified: Secondary | ICD-10-CM | POA: Diagnosis not present

## 2022-03-30 DIAGNOSIS — B379 Candidiasis, unspecified: Secondary | ICD-10-CM | POA: Diagnosis not present

## 2022-03-30 DIAGNOSIS — E119 Type 2 diabetes mellitus without complications: Secondary | ICD-10-CM | POA: Diagnosis not present

## 2022-03-31 DIAGNOSIS — K30 Functional dyspepsia: Secondary | ICD-10-CM | POA: Diagnosis not present

## 2022-03-31 DIAGNOSIS — I5033 Acute on chronic diastolic (congestive) heart failure: Secondary | ICD-10-CM | POA: Diagnosis not present

## 2022-03-31 DIAGNOSIS — I5030 Unspecified diastolic (congestive) heart failure: Secondary | ICD-10-CM | POA: Diagnosis not present

## 2022-03-31 DIAGNOSIS — B379 Candidiasis, unspecified: Secondary | ICD-10-CM | POA: Diagnosis not present

## 2022-03-31 DIAGNOSIS — B370 Candidal stomatitis: Secondary | ICD-10-CM | POA: Diagnosis not present

## 2022-03-31 DIAGNOSIS — I10 Essential (primary) hypertension: Secondary | ICD-10-CM | POA: Diagnosis not present

## 2022-03-31 DIAGNOSIS — E119 Type 2 diabetes mellitus without complications: Secondary | ICD-10-CM | POA: Diagnosis not present

## 2022-04-01 DIAGNOSIS — B370 Candidal stomatitis: Secondary | ICD-10-CM | POA: Diagnosis not present

## 2022-04-01 DIAGNOSIS — K30 Functional dyspepsia: Secondary | ICD-10-CM | POA: Diagnosis not present

## 2022-04-01 DIAGNOSIS — I10 Essential (primary) hypertension: Secondary | ICD-10-CM | POA: Diagnosis not present

## 2022-04-01 DIAGNOSIS — E119 Type 2 diabetes mellitus without complications: Secondary | ICD-10-CM | POA: Diagnosis not present

## 2022-04-01 DIAGNOSIS — I5033 Acute on chronic diastolic (congestive) heart failure: Secondary | ICD-10-CM | POA: Diagnosis not present

## 2022-04-01 DIAGNOSIS — I5030 Unspecified diastolic (congestive) heart failure: Secondary | ICD-10-CM | POA: Diagnosis not present

## 2022-04-01 DIAGNOSIS — B379 Candidiasis, unspecified: Secondary | ICD-10-CM | POA: Diagnosis not present

## 2022-04-02 DIAGNOSIS — I639 Cerebral infarction, unspecified: Secondary | ICD-10-CM | POA: Diagnosis not present

## 2022-04-02 DIAGNOSIS — I509 Heart failure, unspecified: Secondary | ICD-10-CM | POA: Diagnosis not present

## 2022-04-02 DIAGNOSIS — I10 Essential (primary) hypertension: Secondary | ICD-10-CM | POA: Diagnosis not present

## 2022-04-04 DIAGNOSIS — M79642 Pain in left hand: Secondary | ICD-10-CM | POA: Diagnosis not present

## 2022-04-04 DIAGNOSIS — R29898 Other symptoms and signs involving the musculoskeletal system: Secondary | ICD-10-CM | POA: Diagnosis not present

## 2022-04-04 DIAGNOSIS — M79601 Pain in right arm: Secondary | ICD-10-CM | POA: Diagnosis not present

## 2022-04-06 DIAGNOSIS — M6281 Muscle weakness (generalized): Secondary | ICD-10-CM | POA: Diagnosis not present

## 2022-04-06 DIAGNOSIS — R5381 Other malaise: Secondary | ICD-10-CM | POA: Diagnosis not present

## 2022-04-06 DIAGNOSIS — R262 Difficulty in walking, not elsewhere classified: Secondary | ICD-10-CM | POA: Diagnosis not present

## 2022-04-06 DIAGNOSIS — M25569 Pain in unspecified knee: Secondary | ICD-10-CM | POA: Diagnosis not present

## 2022-04-07 DIAGNOSIS — M25512 Pain in left shoulder: Secondary | ICD-10-CM | POA: Diagnosis not present

## 2022-04-07 DIAGNOSIS — M25511 Pain in right shoulder: Secondary | ICD-10-CM | POA: Diagnosis not present

## 2022-04-11 DIAGNOSIS — J069 Acute upper respiratory infection, unspecified: Secondary | ICD-10-CM | POA: Diagnosis not present

## 2022-04-11 DIAGNOSIS — M25511 Pain in right shoulder: Secondary | ICD-10-CM | POA: Diagnosis not present

## 2022-04-11 DIAGNOSIS — M25512 Pain in left shoulder: Secondary | ICD-10-CM | POA: Diagnosis not present

## 2022-04-13 DIAGNOSIS — M6281 Muscle weakness (generalized): Secondary | ICD-10-CM | POA: Diagnosis not present

## 2022-04-13 DIAGNOSIS — R5381 Other malaise: Secondary | ICD-10-CM | POA: Diagnosis not present

## 2022-04-13 DIAGNOSIS — M25569 Pain in unspecified knee: Secondary | ICD-10-CM | POA: Diagnosis not present

## 2022-04-13 DIAGNOSIS — R262 Difficulty in walking, not elsewhere classified: Secondary | ICD-10-CM | POA: Diagnosis not present

## 2022-04-19 ENCOUNTER — Other Ambulatory Visit (HOSPITAL_COMMUNITY): Payer: Self-pay

## 2022-04-20 DIAGNOSIS — M6281 Muscle weakness (generalized): Secondary | ICD-10-CM | POA: Diagnosis not present

## 2022-04-20 DIAGNOSIS — R5381 Other malaise: Secondary | ICD-10-CM | POA: Diagnosis not present

## 2022-04-20 DIAGNOSIS — R262 Difficulty in walking, not elsewhere classified: Secondary | ICD-10-CM | POA: Diagnosis not present

## 2022-04-20 DIAGNOSIS — M25569 Pain in unspecified knee: Secondary | ICD-10-CM | POA: Diagnosis not present

## 2022-04-21 ENCOUNTER — Other Ambulatory Visit (HOSPITAL_COMMUNITY): Payer: Self-pay

## 2022-04-21 DIAGNOSIS — M25512 Pain in left shoulder: Secondary | ICD-10-CM | POA: Diagnosis not present

## 2022-04-21 DIAGNOSIS — M79643 Pain in unspecified hand: Secondary | ICD-10-CM | POA: Diagnosis not present

## 2022-04-21 DIAGNOSIS — M25511 Pain in right shoulder: Secondary | ICD-10-CM | POA: Diagnosis not present

## 2022-04-26 ENCOUNTER — Other Ambulatory Visit (HOSPITAL_COMMUNITY): Payer: Self-pay

## 2022-04-26 ENCOUNTER — Ambulatory Visit (HOSPITAL_BASED_OUTPATIENT_CLINIC_OR_DEPARTMENT_OTHER): Payer: Medicare HMO | Attending: Pulmonary Disease | Admitting: Pulmonary Disease

## 2022-05-02 DIAGNOSIS — E119 Type 2 diabetes mellitus without complications: Secondary | ICD-10-CM | POA: Diagnosis not present

## 2022-05-02 DIAGNOSIS — I5033 Acute on chronic diastolic (congestive) heart failure: Secondary | ICD-10-CM | POA: Diagnosis not present

## 2022-05-02 DIAGNOSIS — L304 Erythema intertrigo: Secondary | ICD-10-CM | POA: Diagnosis not present

## 2022-05-02 DIAGNOSIS — I10 Essential (primary) hypertension: Secondary | ICD-10-CM | POA: Diagnosis not present

## 2022-05-02 DIAGNOSIS — B379 Candidiasis, unspecified: Secondary | ICD-10-CM | POA: Diagnosis not present

## 2022-05-03 DIAGNOSIS — I509 Heart failure, unspecified: Secondary | ICD-10-CM | POA: Diagnosis not present

## 2022-05-03 DIAGNOSIS — I639 Cerebral infarction, unspecified: Secondary | ICD-10-CM | POA: Diagnosis not present

## 2022-05-03 DIAGNOSIS — I10 Essential (primary) hypertension: Secondary | ICD-10-CM | POA: Diagnosis not present

## 2022-05-04 DIAGNOSIS — R5381 Other malaise: Secondary | ICD-10-CM | POA: Diagnosis not present

## 2022-05-04 DIAGNOSIS — M6281 Muscle weakness (generalized): Secondary | ICD-10-CM | POA: Diagnosis not present

## 2022-05-04 DIAGNOSIS — M25569 Pain in unspecified knee: Secondary | ICD-10-CM | POA: Diagnosis not present

## 2022-05-04 DIAGNOSIS — R262 Difficulty in walking, not elsewhere classified: Secondary | ICD-10-CM | POA: Diagnosis not present

## 2022-05-09 DIAGNOSIS — I5033 Acute on chronic diastolic (congestive) heart failure: Secondary | ICD-10-CM | POA: Diagnosis not present

## 2022-05-09 DIAGNOSIS — I10 Essential (primary) hypertension: Secondary | ICD-10-CM | POA: Diagnosis not present

## 2022-05-09 DIAGNOSIS — E119 Type 2 diabetes mellitus without complications: Secondary | ICD-10-CM | POA: Diagnosis not present

## 2022-05-11 DIAGNOSIS — R5381 Other malaise: Secondary | ICD-10-CM | POA: Diagnosis not present

## 2022-05-11 DIAGNOSIS — M25569 Pain in unspecified knee: Secondary | ICD-10-CM | POA: Diagnosis not present

## 2022-05-11 DIAGNOSIS — R262 Difficulty in walking, not elsewhere classified: Secondary | ICD-10-CM | POA: Diagnosis not present

## 2022-05-11 DIAGNOSIS — M6281 Muscle weakness (generalized): Secondary | ICD-10-CM | POA: Diagnosis not present

## 2022-05-13 DIAGNOSIS — I5033 Acute on chronic diastolic (congestive) heart failure: Secondary | ICD-10-CM | POA: Diagnosis not present

## 2022-05-13 DIAGNOSIS — L304 Erythema intertrigo: Secondary | ICD-10-CM | POA: Diagnosis not present

## 2022-05-13 DIAGNOSIS — M79601 Pain in right arm: Secondary | ICD-10-CM | POA: Diagnosis not present

## 2022-05-13 DIAGNOSIS — I10 Essential (primary) hypertension: Secondary | ICD-10-CM | POA: Diagnosis not present

## 2022-05-13 DIAGNOSIS — W19XXXD Unspecified fall, subsequent encounter: Secondary | ICD-10-CM | POA: Diagnosis not present

## 2022-05-13 DIAGNOSIS — M79643 Pain in unspecified hand: Secondary | ICD-10-CM | POA: Diagnosis not present

## 2022-05-13 DIAGNOSIS — M6281 Muscle weakness (generalized): Secondary | ICD-10-CM | POA: Diagnosis not present

## 2022-05-13 DIAGNOSIS — I5030 Unspecified diastolic (congestive) heart failure: Secondary | ICD-10-CM | POA: Diagnosis not present

## 2022-05-13 DIAGNOSIS — B379 Candidiasis, unspecified: Secondary | ICD-10-CM | POA: Diagnosis not present

## 2022-05-13 DIAGNOSIS — M79642 Pain in left hand: Secondary | ICD-10-CM | POA: Diagnosis not present

## 2022-05-13 DIAGNOSIS — R29898 Other symptoms and signs involving the musculoskeletal system: Secondary | ICD-10-CM | POA: Diagnosis not present

## 2022-05-13 DIAGNOSIS — R262 Difficulty in walking, not elsewhere classified: Secondary | ICD-10-CM | POA: Diagnosis not present

## 2022-05-16 ENCOUNTER — Telehealth: Payer: Self-pay

## 2022-05-16 NOTE — Telephone Encounter (Signed)
Would recommend starting with the patches and if not controlling the craving then she can go up to the 

## 2022-05-16 NOTE — Telephone Encounter (Signed)
Pt is requesting a call back .Marland Kitchen Pt   stated that her daughter bought her the 20 mg dose of nicotine  patches  OTC , which is higher dose than she was on from [place]  and she wants to know if that is ok to use

## 2022-05-16 NOTE — Telephone Encounter (Signed)
Called pt back. Stated she has the patches. She's unsure how much she smokes now. Stated when she was in the hospital, she was on 14 mcg. Then she went to the nursing home for about 2 months and did not smoke. But now she has cravings to smoke again.about 1 -2 cigs sometime more.

## 2022-05-18 ENCOUNTER — Telehealth: Payer: Self-pay

## 2022-05-18 NOTE — Telephone Encounter (Signed)
RTC to [place] from [facility]. Need orders to continue skilled nursing for Heart Failure teaching and education.  Patient has a yeast infection under her left breast and right abdominal folds.  Has an odor.  Has used Nystatin powder with poor results.. Finishes her Fluconazole tomorrow.  What else can be prescribed.  Patient has transportation problems.  Have offered patient a 10:45 AM appointment for tomorrow.  Not sure if she will be able to get transportation to come in.

## 2022-05-18 NOTE — Telephone Encounter (Signed)
Stephanie from [place] called to give a report on the patient and is requesting continued orders call back @ [phone]

## 2022-05-19 ENCOUNTER — Encounter: Payer: Self-pay | Admitting: Student

## 2022-05-19 ENCOUNTER — Other Ambulatory Visit (HOSPITAL_COMMUNITY): Payer: Self-pay

## 2022-05-19 ENCOUNTER — Ambulatory Visit (INDEPENDENT_AMBULATORY_CARE_PROVIDER_SITE_OTHER): Payer: Medicare HMO | Admitting: Student

## 2022-05-19 VITALS — BP 125/69 | HR 96 | Temp 98.2°F | Ht 68.0 in | Wt 202.0 lb

## 2022-05-19 DIAGNOSIS — N184 Chronic kidney disease, stage 4 (severe): Secondary | ICD-10-CM | POA: Diagnosis not present

## 2022-05-19 DIAGNOSIS — D649 Anemia, unspecified: Secondary | ICD-10-CM | POA: Diagnosis not present

## 2022-05-19 DIAGNOSIS — F17210 Nicotine dependence, cigarettes, uncomplicated: Secondary | ICD-10-CM

## 2022-05-19 DIAGNOSIS — R5383 Other fatigue: Secondary | ICD-10-CM

## 2022-05-19 DIAGNOSIS — L03319 Cellulitis of trunk, unspecified: Secondary | ICD-10-CM

## 2022-05-19 DIAGNOSIS — I130 Hypertensive heart and chronic kidney disease with heart failure and stage 1 through stage 4 chronic kidney disease, or unspecified chronic kidney disease: Secondary | ICD-10-CM | POA: Diagnosis not present

## 2022-05-19 DIAGNOSIS — I10 Essential (primary) hypertension: Secondary | ICD-10-CM

## 2022-05-19 DIAGNOSIS — I5032 Chronic diastolic (congestive) heart failure: Secondary | ICD-10-CM

## 2022-05-19 DIAGNOSIS — I5030 Unspecified diastolic (congestive) heart failure: Secondary | ICD-10-CM

## 2022-05-19 DIAGNOSIS — E559 Vitamin D deficiency, unspecified: Secondary | ICD-10-CM

## 2022-05-19 DIAGNOSIS — Z720 Tobacco use: Secondary | ICD-10-CM

## 2022-05-19 DIAGNOSIS — E669 Obesity, unspecified: Secondary | ICD-10-CM

## 2022-05-19 DIAGNOSIS — I639 Cerebral infarction, unspecified: Secondary | ICD-10-CM

## 2022-05-19 MED ORDER — METOPROLOL SUCCINATE ER 100 MG PO TB24
100.0000 mg | ORAL_TABLET | Freq: Every day | ORAL | 3 refills | Status: DC
  Filled 2022-05-19: qty 90, 90d supply, fill #0

## 2022-05-19 MED ORDER — SITAGLIPTIN PHOSPHATE 25 MG PO TABS
25.0000 mg | ORAL_TABLET | Freq: Every day | ORAL | 1 refills | Status: DC
  Filled 2022-05-19: qty 90, 90d supply, fill #0

## 2022-05-19 MED ORDER — CLOTRIMAZOLE 1 % EX SOLN
CUTANEOUS | 1 refills | Status: DC
  Filled 2022-05-19: qty 30, 30d supply, fill #0

## 2022-05-19 MED ORDER — FUROSEMIDE 40 MG PO TABS
40.0000 mg | ORAL_TABLET | Freq: Every day | ORAL | 2 refills | Status: DC
  Filled 2022-05-19: qty 60, 60d supply, fill #0

## 2022-05-19 MED ORDER — NICOTINE 14 MG/24HR TD PT24
14.0000 mg | MEDICATED_PATCH | TRANSDERMAL | 1 refills | Status: DC
  Filled 2022-05-19: qty 28, 28d supply, fill #0

## 2022-05-19 MED ORDER — ASPIRIN 81 MG PO TBEC
81.0000 mg | DELAYED_RELEASE_TABLET | Freq: Every evening | ORAL | 1 refills | Status: DC
  Filled 2022-05-19: qty 90, 90d supply, fill #0

## 2022-05-19 MED ORDER — AMLODIPINE BESYLATE 10 MG PO TABS
10.0000 mg | ORAL_TABLET | Freq: Every day | ORAL | 11 refills | Status: DC
  Filled 2022-05-19: qty 30, 30d supply, fill #0

## 2022-05-19 MED ORDER — SPIRONOLACTONE 100 MG PO TABS
100.0000 mg | ORAL_TABLET | Freq: Every day | ORAL | 1 refills | Status: DC
  Filled 2022-05-19: qty 90, 90d supply, fill #0

## 2022-05-19 MED ORDER — CEPHALEXIN 500 MG PO CAPS
500.0000 mg | ORAL_CAPSULE | Freq: Two times a day (BID) | ORAL | 0 refills | Status: DC
  Filled 2022-05-19: qty 14, 7d supply, fill #0

## 2022-05-19 MED ORDER — ROSUVASTATIN CALCIUM 10 MG PO TABS
10.0000 mg | ORAL_TABLET | Freq: Every evening | ORAL | 1 refills | Status: DC
  Filled 2022-05-19: qty 90, 90d supply, fill #0

## 2022-05-19 NOTE — Progress Notes (Signed)
   CC: Hospital follow-up/rash  HPI:  Alicia [name] is a 71 y.o. female with PMH as below who presents to clinic for hospital follow-up and for evaluation of a rash under her groin and both breast. Please see problem based charting for evaluation, assessment and plan.  Past Medical History:  Diagnosis Date   Allergy    Arthritis    Chronic headaches    Constipation    Depression    Diabetes mellitus    GERD (gastroesophageal reflux disease)    Hyperlipidemia    Hypertension    Obesity    PAD (peripheral artery disease) (HCC)    Shortness of breath    Stage 3 chronic kidney disease (HCC)    Stroke ([place])     Review of Systems:  Constitutional: Negative for fever, chills or fatigue Eyes: Negative for visual changes Respiratory: Positive for occasional shortness of breath Cardiac: Negative for chest pain or palpitation Skin: Positive for rash under her breast and groin Abdomen: Negative for abdominal pain, nausea, or vomiting. Neuro: Negative for headache, dizziness or weakness  Physical Exam: General: Pleasant, well-appearing elderly female accompanied by neighbor. No acute distress. HEENT: Dry mucous membrane. Cardiac: Mild tachycardia. Normal rhythm. No murmurs, rubs or gallops. No LE edema Respiratory: Lungs CTAB. Decreased air movement at the bases. No wheezing or crackles. Abdominal: Soft, symmetric and non tender. Normal BS. Skin: Warm, dry. Decreased skin turgor. Mild erythema and odor under her right pannus. Mild erythema across her mid chest and under both breasts. Inferior aspect of L breast with moderate erythema, warmth, tenderness and slouching of the skin.(See media tab).  Extremities: Atraumatic. Full ROM. Radial pulse 2+ and symmetric, DP pulse 1+ and symmetric Neuro: A&O x 3. Moves all extremities. Normal sensation to gross touch. Psych: Appropriate mood and affect.  Vitals:   05/19/22 1044  BP: 125/69  Pulse: 96  Temp: 98.2 F (36.8 C)   TempSrc: Oral  SpO2: 100%  Weight: 202 lb (91.6 kg)  Height: '5\' 8"'$  (1.727 m)    Assessment & Plan:   See Encounters Tab for problem based charting.  Patient discussed with Dr. Michelle Nasuti, MD, MPH

## 2022-05-19 NOTE — Patient Instructions (Signed)
Thank you, Ms.Raymonda D Tribbey for allowing [place] to provide your care today. Today we discussed your recent hospitalization, blood pressure, lab work and skin infection.  I am starting you on oral antibiotics and antifungal cream to help with the skin infection.  Please continue taking all your medications as prescribed.  I have ordered the following labs for you:   Lab Orders         CMP14 + Anion Gap         CBC no Diff         Vitamin D (25 hydroxy)         TSH      I will call if any are abnormal. All of your labs can be accessed through "My Chart".  I have ordered the following tests: Screening mammogram  I have ordered the following medication/changed the following medications:  Start Keflex 500 mg every 12 hours for 7 days Apply clometrizole cream under breasts and under pannus twice daily Decrease your Lasix to 40 mg to once daily  My Chart Access: [email or web address]  Please follow-up in 4 weeks  Please make sure to arrive 15 minutes prior to your next appointment. If you arrive late, you may be asked to reschedule.    We look forward to seeing you next time. Please call our clinic at [phone] if you have any questions or concerns. The best time to call is Monday-Friday from 9am-4pm, but there is someone available 24/7. If after hours or the weekend, call the main hospital number and ask for the Internal Medicine Resident On-Call. If you need medication refills, please notify your pharmacy one week in advance and they will send [place] a request.   Thank you for letting us take part in your care. Wishing you the best!  Lacinda Axon, MD 05/19/2022, 11:49 AM IM Resident, PGY-2 [facility]al[facility] Milian 41:10

## 2022-05-20 ENCOUNTER — Other Ambulatory Visit (HOSPITAL_COMMUNITY): Payer: Self-pay

## 2022-05-20 ENCOUNTER — Inpatient Hospital Stay (HOSPITAL_COMMUNITY)
Admission: EM | Admit: 2022-05-20 | Discharge: 2022-05-25 | DRG: 683 | Disposition: A | Discharge status: Home Health | Payer: Medicare HMO | Attending: Internal Medicine | Admitting: Internal Medicine

## 2022-05-20 ENCOUNTER — Observation Stay (HOSPITAL_COMMUNITY): Payer: Medicare HMO

## 2022-05-20 ENCOUNTER — Encounter: Payer: Self-pay | Admitting: Student

## 2022-05-20 ENCOUNTER — Other Ambulatory Visit: Payer: Self-pay

## 2022-05-20 DIAGNOSIS — Y92230 Patient room in hospital as the place of occurrence of the external cause: Secondary | ICD-10-CM | POA: Diagnosis present

## 2022-05-20 DIAGNOSIS — I5032 Chronic diastolic (congestive) heart failure: Secondary | ICD-10-CM | POA: Diagnosis not present

## 2022-05-20 DIAGNOSIS — Z7982 Long term (current) use of aspirin: Secondary | ICD-10-CM | POA: Diagnosis not present

## 2022-05-20 DIAGNOSIS — I10 Essential (primary) hypertension: Secondary | ICD-10-CM

## 2022-05-20 DIAGNOSIS — S80211A Abrasion, right knee, initial encounter: Secondary | ICD-10-CM | POA: Diagnosis not present

## 2022-05-20 DIAGNOSIS — M25562 Pain in left knee: Secondary | ICD-10-CM | POA: Diagnosis not present

## 2022-05-20 DIAGNOSIS — E860 Dehydration: Secondary | ICD-10-CM | POA: Diagnosis present

## 2022-05-20 DIAGNOSIS — Z7401 Bed confinement status: Secondary | ICD-10-CM | POA: Diagnosis not present

## 2022-05-20 DIAGNOSIS — N179 Acute kidney failure, unspecified: Principal | ICD-10-CM | POA: Diagnosis present

## 2022-05-20 DIAGNOSIS — I739 Peripheral vascular disease, unspecified: Secondary | ICD-10-CM | POA: Diagnosis not present

## 2022-05-20 DIAGNOSIS — Z7984 Long term (current) use of oral hypoglycemic drugs: Secondary | ICD-10-CM

## 2022-05-20 DIAGNOSIS — E785 Hyperlipidemia, unspecified: Secondary | ICD-10-CM | POA: Diagnosis present

## 2022-05-20 DIAGNOSIS — E1122 Type 2 diabetes mellitus with diabetic chronic kidney disease: Secondary | ICD-10-CM | POA: Diagnosis present

## 2022-05-20 DIAGNOSIS — W19XXXA Unspecified fall, initial encounter: Secondary | ICD-10-CM | POA: Diagnosis not present

## 2022-05-20 DIAGNOSIS — Z8673 Personal history of transient ischemic attack (TIA), and cerebral infarction without residual deficits: Secondary | ICD-10-CM | POA: Diagnosis not present

## 2022-05-20 DIAGNOSIS — R531 Weakness: Secondary | ICD-10-CM | POA: Diagnosis not present

## 2022-05-20 DIAGNOSIS — N250 Renal osteodystrophy: Secondary | ICD-10-CM | POA: Diagnosis not present

## 2022-05-20 DIAGNOSIS — M25561 Pain in right knee: Secondary | ICD-10-CM | POA: Diagnosis not present

## 2022-05-20 DIAGNOSIS — F17210 Nicotine dependence, cigarettes, uncomplicated: Secondary | ICD-10-CM | POA: Diagnosis present

## 2022-05-20 DIAGNOSIS — R9431 Abnormal electrocardiogram [ECG] [EKG]: Secondary | ICD-10-CM | POA: Diagnosis not present

## 2022-05-20 DIAGNOSIS — E1165 Type 2 diabetes mellitus with hyperglycemia: Secondary | ICD-10-CM | POA: Diagnosis present

## 2022-05-20 DIAGNOSIS — K5900 Constipation, unspecified: Secondary | ICD-10-CM | POA: Diagnosis not present

## 2022-05-20 DIAGNOSIS — Z79899 Other long term (current) drug therapy: Secondary | ICD-10-CM

## 2022-05-20 DIAGNOSIS — E1151 Type 2 diabetes mellitus with diabetic peripheral angiopathy without gangrene: Secondary | ICD-10-CM | POA: Diagnosis present

## 2022-05-20 DIAGNOSIS — M170 Bilateral primary osteoarthritis of knee: Secondary | ICD-10-CM | POA: Diagnosis present

## 2022-05-20 DIAGNOSIS — N184 Chronic kidney disease, stage 4 (severe): Secondary | ICD-10-CM

## 2022-05-20 DIAGNOSIS — Z888 Allergy status to other drugs, medicaments and biological substances status: Secondary | ICD-10-CM | POA: Diagnosis not present

## 2022-05-20 DIAGNOSIS — L03313 Cellulitis of chest wall: Secondary | ICD-10-CM | POA: Diagnosis present

## 2022-05-20 DIAGNOSIS — L98499 Non-pressure chronic ulcer of skin of other sites with unspecified severity: Secondary | ICD-10-CM | POA: Diagnosis present

## 2022-05-20 DIAGNOSIS — I129 Hypertensive chronic kidney disease with stage 1 through stage 4 chronic kidney disease, or unspecified chronic kidney disease: Secondary | ICD-10-CM | POA: Diagnosis not present

## 2022-05-20 DIAGNOSIS — T502X5A Adverse effect of carbonic-anhydrase inhibitors, benzothiadiazides and other diuretics, initial encounter: Secondary | ICD-10-CM | POA: Diagnosis present

## 2022-05-20 DIAGNOSIS — L304 Erythema intertrigo: Secondary | ICD-10-CM

## 2022-05-20 DIAGNOSIS — L0390 Cellulitis, unspecified: Secondary | ICD-10-CM | POA: Insufficient documentation

## 2022-05-20 DIAGNOSIS — Z833 Family history of diabetes mellitus: Secondary | ICD-10-CM

## 2022-05-20 DIAGNOSIS — I5030 Unspecified diastolic (congestive) heart failure: Secondary | ICD-10-CM | POA: Diagnosis not present

## 2022-05-20 DIAGNOSIS — D649 Anemia, unspecified: Secondary | ICD-10-CM | POA: Diagnosis not present

## 2022-05-20 DIAGNOSIS — E872 Acidosis, unspecified: Secondary | ICD-10-CM

## 2022-05-20 DIAGNOSIS — E559 Vitamin D deficiency, unspecified: Secondary | ICD-10-CM | POA: Insufficient documentation

## 2022-05-20 DIAGNOSIS — L089 Local infection of the skin and subcutaneous tissue, unspecified: Secondary | ICD-10-CM | POA: Diagnosis not present

## 2022-05-20 DIAGNOSIS — Z91040 Latex allergy status: Secondary | ICD-10-CM

## 2022-05-20 DIAGNOSIS — Z8249 Family history of ischemic heart disease and other diseases of the circulatory system: Secondary | ICD-10-CM

## 2022-05-20 DIAGNOSIS — I130 Hypertensive heart and chronic kidney disease with heart failure and stage 1 through stage 4 chronic kidney disease, or unspecified chronic kidney disease: Secondary | ICD-10-CM | POA: Diagnosis present

## 2022-05-20 DIAGNOSIS — I509 Heart failure, unspecified: Secondary | ICD-10-CM | POA: Diagnosis not present

## 2022-05-20 DIAGNOSIS — M1711 Unilateral primary osteoarthritis, right knee: Secondary | ICD-10-CM | POA: Diagnosis not present

## 2022-05-20 LAB — CMP14 + ANION GAP
ALT: 9 IU/L (ref 0–32)
AST: 9 IU/L (ref 0–40)
Albumin/Globulin Ratio: 0.9 — ABNORMAL LOW (ref 1.2–2.2)
Albumin: 3.5 g/dL — ABNORMAL LOW (ref 3.7–4.7)
Alkaline Phosphatase: 114 IU/L (ref 44–121)
Anion Gap: 22 mmol/L — ABNORMAL HIGH (ref 10.0–18.0)
BUN/Creatinine Ratio: 12 (ref 12–28)
BUN: 84 mg/dL (ref 8–27)
Bilirubin Total: 0.4 mg/dL (ref 0.0–1.2)
CO2: 16 mmol/L — ABNORMAL LOW (ref 20–29)
Calcium: 8.7 mg/dL (ref 8.7–10.3)
Chloride: 95 mmol/L — ABNORMAL LOW (ref 96–106)
Creatinine, Ser: 6.95 mg/dL — ABNORMAL HIGH (ref 0.57–1.00)
Globulin, Total: 3.8 g/dL (ref 1.5–4.5)
Glucose: 179 mg/dL — ABNORMAL HIGH (ref 70–99)
Potassium: 5.1 mmol/L (ref 3.5–5.2)
Sodium: 133 mmol/L — ABNORMAL LOW (ref 134–144)
Total Protein: 7.3 g/dL (ref 6.0–8.5)
eGFR: 6 mL/min/{1.73_m2} — ABNORMAL LOW (ref >59)

## 2022-05-20 LAB — CBC
HCT: 36.3 % (ref 36.0–46.0)
Hematocrit: 40.6 % (ref 34.0–46.6)
Hemoglobin: 13.8 g/dL (ref 11.1–15.9)
Hemoglobin: 12.3 g/dL (ref 12.0–15.0)
MCH: 29.2 pg (ref 26.6–33.0)
MCH: 29.6 pg (ref 26.0–34.0)
MCHC: 34 g/dL (ref 31.5–35.7)
MCHC: 33.9 g/dL (ref 30.0–36.0)
MCV: 86 fL (ref 79–97)
MCV: 87.3 fL (ref 80.0–100.0)
Platelets: 296 10*3/uL (ref 150–450)
Platelets: 267 10*3/uL (ref 150–400)
RBC: 4.73 x10E6/uL (ref 3.77–5.28)
RBC: 4.16 MIL/uL (ref 3.87–5.11)
RDW: 16.3 % — ABNORMAL HIGH (ref 11.7–15.4)
RDW: 16.2 % — ABNORMAL HIGH (ref 11.5–15.5)
WBC: 13.9 10*3/uL — ABNORMAL HIGH (ref 3.4–10.8)
WBC: 10.4 10*3/uL (ref 4.0–10.5)
nRBC: 0 % (ref 0.0–0.2)

## 2022-05-20 LAB — TSH: TSH: 0.985 u[IU]/mL (ref 0.450–4.500)

## 2022-05-20 LAB — CBG MONITORING, ED: Glucose-Capillary: 130 mg/dL — ABNORMAL HIGH (ref 70–99)

## 2022-05-20 LAB — VITAMIN D 25 HYDROXY (VIT D DEFICIENCY, FRACTURES): Vit D, 25-Hydroxy: 8.8 ng/mL — ABNORMAL LOW (ref 30.0–100.0)

## 2022-05-20 LAB — BASIC METABOLIC PANEL
Anion gap: 17 — ABNORMAL HIGH (ref 5–15)
BUN: 95 mg/dL — ABNORMAL HIGH (ref 8–23)
CO2: 19 mmol/L — ABNORMAL LOW (ref 22–32)
Calcium: 8.3 mg/dL — ABNORMAL LOW (ref 8.9–10.3)
Chloride: 98 mmol/L (ref 98–111)
Creatinine, Ser: 7.23 mg/dL — ABNORMAL HIGH (ref 0.44–1.00)
GFR, Estimated: 6 mL/min — ABNORMAL LOW (ref >60)
Glucose, Bld: 119 mg/dL — ABNORMAL HIGH (ref 70–99)
Potassium: 4.9 mmol/L (ref 3.5–5.1)
Sodium: 134 mmol/L — ABNORMAL LOW (ref 135–145)

## 2022-05-20 MED ORDER — HEPARIN SODIUM (PORCINE) 5000 UNIT/ML IJ SOLN
5000.0000 [IU] | Freq: Three times a day (TID) | INTRAMUSCULAR | Status: DC
  Administered 2022-05-20 – 2022-05-25 (×13): 5000 [IU] via SUBCUTANEOUS
  Filled 2022-05-20 (×14): qty 1

## 2022-05-20 MED ORDER — CLOTRIMAZOLE 1 % EX CREA
TOPICAL_CREAM | Freq: Two times a day (BID) | CUTANEOUS | Status: DC
Start: 2022-05-20 — End: 2022-05-23
  Filled 2022-05-20 (×2): qty 15

## 2022-05-20 MED ORDER — LINAGLIPTIN 5 MG PO TABS
5.0000 mg | ORAL_TABLET | Freq: Every day | ORAL | Status: DC
  Administered 2022-05-21 – 2022-05-25 (×5): 5 mg via ORAL
  Filled 2022-05-20 (×5): qty 1

## 2022-05-20 MED ORDER — ROSUVASTATIN CALCIUM 5 MG PO TABS
10.0000 mg | ORAL_TABLET | Freq: Every evening | ORAL | Status: DC
  Administered 2022-05-20 – 2022-05-24 (×5): 10 mg via ORAL
  Filled 2022-05-20 (×5): qty 2

## 2022-05-20 MED ORDER — VITAMIN D (ERGOCALCIFEROL) 1.25 MG (50000 UNIT) PO CAPS
50000.0000 [IU] | ORAL_CAPSULE | ORAL | 0 refills | Status: AC
  Filled 2022-05-20: qty 6, 42d supply, fill #0

## 2022-05-20 MED ORDER — ACETAMINOPHEN 325 MG PO TABS
650.0000 mg | ORAL_TABLET | Freq: Four times a day (QID) | ORAL | Status: DC | PRN
  Administered 2022-05-21 – 2022-05-22 (×2): 650 mg via ORAL
  Filled 2022-05-20 (×2): qty 2

## 2022-05-20 MED ORDER — ALBUTEROL SULFATE HFA 108 (90 BASE) MCG/ACT IN AERS
2.0000 | INHALATION_SPRAY | Freq: Four times a day (QID) | RESPIRATORY_TRACT | Status: DC | PRN
Start: 2022-05-20 — End: 2022-05-20

## 2022-05-20 MED ORDER — SODIUM CHLORIDE 0.9 % IV SOLN
1000.0000 mL | INTRAVENOUS | Status: DC
  Administered 2022-05-20: 1000 mL via INTRAVENOUS

## 2022-05-20 MED ORDER — ONDANSETRON HCL 4 MG PO TABS
4.0000 mg | ORAL_TABLET | Freq: Three times a day (TID) | ORAL | Status: DC | PRN
Start: 2022-05-20 — End: 2022-05-25
  Administered 2022-05-24: 4 mg via ORAL
  Filled 2022-05-20: qty 1

## 2022-05-20 MED ORDER — DOXYCYCLINE HYCLATE 100 MG PO TABS
100.0000 mg | ORAL_TABLET | Freq: Two times a day (BID) | ORAL | Status: DC
  Administered 2022-05-20 – 2022-05-25 (×10): 100 mg via ORAL
  Filled 2022-05-20 (×10): qty 1

## 2022-05-20 MED ORDER — SENNOSIDES-DOCUSATE SODIUM 8.6-50 MG PO TABS: 1.0000 | ORAL_TABLET | Freq: Every evening | ORAL | Status: DC | PRN

## 2022-05-20 MED ORDER — SODIUM CHLORIDE 0.9 % IV BOLUS (SEPSIS)
1000.0000 mL | Freq: Once | INTRAVENOUS | Status: AC
  Administered 2022-05-20: 1000 mL via INTRAVENOUS

## 2022-05-20 MED ORDER — ASPIRIN 81 MG PO TBEC
81.0000 mg | DELAYED_RELEASE_TABLET | Freq: Every evening | ORAL | Status: DC
  Administered 2022-05-20 – 2022-05-24 (×5): 81 mg via ORAL
  Filled 2022-05-20 (×5): qty 1

## 2022-05-20 MED ORDER — ALBUTEROL SULFATE (2.5 MG/3ML) 0.083% IN NEBU: 2.5000 mg | INHALATION_SOLUTION | Freq: Four times a day (QID) | RESPIRATORY_TRACT | Status: DC | PRN

## 2022-05-20 MED ORDER — AMLODIPINE BESYLATE 10 MG PO TABS
10.0000 mg | ORAL_TABLET | Freq: Every day | ORAL | Status: DC
  Administered 2022-05-21 – 2022-05-25 (×5): 10 mg via ORAL
  Filled 2022-05-20 (×5): qty 1

## 2022-05-20 MED ORDER — SODIUM CHLORIDE 0.9 % IV SOLN
2.0000 g | Freq: Once | INTRAVENOUS | Status: AC
  Administered 2022-05-20: 2 g via INTRAVENOUS
  Filled 2022-05-20: qty 20

## 2022-05-20 MED ORDER — METOPROLOL SUCCINATE ER 100 MG PO TB24
100.0000 mg | ORAL_TABLET | Freq: Every day | ORAL | Status: DC
  Administered 2022-05-21 – 2022-05-25 (×5): 100 mg via ORAL
  Filled 2022-05-20 (×5): qty 1

## 2022-05-20 MED ORDER — ACETAMINOPHEN 650 MG RE SUPP
650.0000 mg | Freq: Four times a day (QID) | RECTAL | Status: DC | PRN
Start: 2022-05-20 — End: 2022-05-22

## 2022-05-20 NOTE — Assessment & Plan Note (Signed)
Patient and neighbor requested new prescription for nicotine patch. -Refilled nicotine patch 14 mcg every 24 hours

## 2022-05-20 NOTE — Assessment & Plan Note (Addendum)
Patient was hospitalized for 10 days in early May for uncontrolled hypertension, fall and headache.  Patient underwent work-up for giant cell arteritis with temporal artery biopsy that was negative.  Patient was discharged to SNF but blood pressure remained elevated at discharge.  Blood pressure significantly improved today with SBP in the 120s.  Patient denies any headache or dizziness. -Refilled all BP meds today

## 2022-05-20 NOTE — Assessment & Plan Note (Signed)
Patient with a history of hypocalcemia found to have vitamin D severely low at 8.8. Calcium levels have improved from 7.6 at recent hospital discharge to 8.7 today. I will start patient on high-dose vitamin D and repeat vitamin D levels in 6 to 8 weeks. -Start vitamin D 50,000 units weekly -Repeat vitamin D levels in 6-8 weeks

## 2022-05-20 NOTE — Telephone Encounter (Signed)
Return call from [facility], [facility] [place]. VO given for "skilled nursing for Heart Failure teaching and education". She also asked what was done for pt's skin infection at her OV yesterday - informed the doctor ordered Keflex and lotrimin cream.

## 2022-05-20 NOTE — Assessment & Plan Note (Signed)
Patient here for hospital follow-up after recent discharge from nursing home and found to have rash across her chest, under her breast and under her right groin. Patient reports that the rash started while she was in the nursing home. They apply some powder under both of her breast and groin but she reports that it burned and irritated her. She denies any fevers or chills but endorses mild pain and irritation under her left breast. On exam, patient had mild to moderate erythema across her chest, under both breast and under her right pannus consistent with cellulitis with possible superimposed fungal infection. White count mildly elevated to 13.9. Patient denies any systemic symptoms and is nontoxic. Will manage with 7 days of Keflex and antifungal cream with close follow-up.  Plan: -Start Keflex 500 mg twice daily for 7 days -Start clotrimazole 1% cream, apply under both breast and on the right groin twice daily -Follow-up in 2 to 4 weeks.

## 2022-05-20 NOTE — ED Provider Notes (Signed)
[facility] EMERGENCY DEPARTMENT Provider Note   CSN: [id number] Arrival date & time: 05/20/22  1832     History  Chief Complaint  Patient presents with   Weakness    Alicia [name] is a 71 y.o. female.   Weakness  Pt states she was told that her lab tests were abnormal and she needed to come to the hospital.  Outpatient records reviewed.  Pt seen in IM clinic yesterday.  SHe had labs and they showed increased bun and creatinine.  Pt also with findings on exam concerning for cellulitis and superimposed fungal infection.  Pt has been nauseated.  Decreased appetite.  NO vomiting or diarrhea.  Pt has noticed she is not urinating as much as usual.  No fevers.  Pt is feeling weak.   Patient states she still is having pain in the skin underneath her left breast as well as in her inguinal region.  No known fevers or chills    Home Medications Prior to Admission medications   Medication Sig Start Date End Date Taking? Authorizing Provider  acetaminophen (TYLENOL) 650 MG CR tablet Take 1,300 mg by mouth every 8 (eight) hours as needed for pain.    [provider]  albuterol (VENTOLIN HFA) 108 (90 Base) MCG/ACT inhaler Inhale 2 puffs into the lungs every 6 (six) hours as needed for wheezing or shortness of breath. 12/10/21   Rick Duff, MD  amLODipine (NORVASC) 10 MG tablet Take 1 tablet (10 mg total) by mouth daily. 05/19/22 05/19/23  Lacinda Axon, MD  aspirin EC 81 MG tablet Take 1 tablet (81 mg total) by mouth every evening. 05/19/22   Lacinda Axon, MD  blood glucose meter kit and supplies per insurance preference. Check cbg once a day. Dx E11.9 01/06/20   Jacelyn Pi, Lilia Argue, MD  cephALEXin (KEFLEX) 500 MG capsule Take 1 capsule (500 mg total) by mouth 2 (two) times daily for 7 days 05/19/22   Lacinda Axon, MD  clotrimazole (LOTRIMIN) 1 % external solution Apply to under breast and under the pannus twice daily 05/19/22   Lacinda Axon, MD  diclofenac Sodium (VOLTAREN) 1 % GEL Apply 2 g topically 3 (three) times daily as needed (Pain). 12/03/21   [name], MD  furosemide (LASIX) 40 MG tablet Take 1 tablet (40 mg total) by mouth daily. 05/19/22   Lacinda Axon, MD  metoprolol succinate (TOPROL-XL) 100 MG 24 hr tablet Take 1 tablet (100 mg total) by mouth daily. Take with or immediately following a meal. 05/19/22   Amponsah, Charisse March, MD  nicotine (NICODERM CQ - DOSED IN MG/24 HOURS) 14 mg/24hr patch Place 1 patch (14 mg total) onto the skin daily. 05/19/22   Lacinda Axon, MD  OXYGEN Inhale 2 L into the lungs as needed (oxygen).    [provider]  rosuvastatin (CRESTOR) 10 MG tablet Take 1 tablet (10 mg total) by mouth every evening. 05/19/22   Lacinda Axon, MD  sitaGLIPtin (JANUVIA) 25 MG tablet Take 1 tablet (25 mg total) by mouth daily. 05/19/22   Lacinda Axon, MD  spironolactone (ALDACTONE) 100 MG tablet Take 1 tablet (100 mg total) by mouth daily. 05/19/22   Lacinda Axon, MD  Vitamin D, Ergocalciferol, (DRISDOL) 1.25 MG (50000 UNIT) CAPS capsule Take 1 capsule (50,000 Units total) by mouth every 7 (seven) days for 6 doses. 05/20/22 07/01/22  Lacinda Axon, MD      Allergies  Nifedipine er, Atorvastatin, and Latex    Review of Systems   Review of Systems  Neurological:  Positive for weakness.    Physical Exam Updated Vital Signs BP (!) 144/78   Pulse 90   Temp 98 F (36.7 C) (Oral)   Resp 15   Ht 1.727 m (_0 )   Wt 91.6 kg   SpO2 97%   BMI 30.71 kg/m  Physical Exam Vitals and nursing note reviewed.  Constitutional:      General: She is not in acute distress.    Appearance: She is well-developed.  HENT:     Head: Normocephalic and atraumatic.     Right Ear: External ear normal.     Left Ear: External ear normal.     Mouth/Throat:     Mouth: Mucous membranes are dry.  Eyes:     General: No scleral icterus.       Right eye: No discharge.         Left eye: No discharge.     Conjunctiva/sclera: Conjunctivae normal.  Neck:     Trachea: No tracheal deviation.  Cardiovascular:     Rate and Rhythm: Normal rate and regular rhythm.  Pulmonary:     Effort: Pulmonary effort is normal. No respiratory distress.     Breath sounds: Normal breath sounds. No stridor. No wheezing or rales.  Abdominal:     General: Bowel sounds are normal. There is no distension.     Palpations: Abdomen is soft.     Tenderness: There is no abdominal tenderness. There is no guarding or rebound.  Musculoskeletal:        General: No tenderness or deformity.     Cervical back: Neck supple.  Skin:    General: Skin is warm and dry.     Findings: Erythema and rash present.     Comments: Erythematous rash with superficial skin ulceration in the skin folds underneath the left breast and in the right inguinal region below her pannus, suggestive of yeast infection questionable superimposed bacterial infection  Neurological:     General: No focal deficit present.     Mental Status: She is alert.     Cranial Nerves: No cranial nerve deficit (no facial droop, extraocular movements intact, no slurred speech).     Sensory: No sensory deficit.     Motor: No abnormal muscle tone or seizure activity.     Coordination: Coordination normal.  Psychiatric:        Mood and Affect: Mood normal.     ED Results / Procedures / Treatments   Labs (all labs ordered are listed, but only abnormal results are displayed) Labs Reviewed  BASIC METABOLIC PANEL - Abnormal; Notable for the following components:      Result Value   Sodium 134 (*)    CO2 19 (*)    Glucose, Bld 119 (*)    BUN 95 (*)    Creatinine, Ser 7.23 (*)    Calcium 8.3 (*)    GFR, Estimated 6 (*)    Anion gap 17 (*)    All other components within normal limits  CBC - Abnormal; Notable for the following components:   RDW 16.2 (*)    All other components within normal limits  CBG MONITORING, ED - Abnormal; Notable  for the following components:   Glucose-Capillary 130 (*)    All other components within normal limits  URINALYSIS, ROUTINE W REFLEX MICROSCOPIC    EKG None  Radiology No results found.  Procedures Procedures    Medications Ordered in ED Medications  sodium chloride 0.9 % bolus 1,000 mL (1,000 mLs Intravenous New Bag/Given 05/20/22 2011)    Followed by  0.9 %  sodium chloride infusion (1,000 mLs Intravenous New Bag/Given 05/20/22 2017)  cefTRIAXone (ROCEPHIN) 2 g in sodium chloride 0.9 % 100 mL IVPB (2 g Intravenous New Bag/Given 05/20/22 2012)    ED Course/ Medical Decision Making/ A&P Clinical Course as of 05/20/22 2121  Fri May 20, 2022  2992 Basic metabolic panel(!) Creatinine significantly elevated compared to 1 month ago.  Increased compared to yesterday.  No hyperkalemia [JK]  2103 CBC(!) Normal [JK]  2115 Case discussed with Dr. Joylene Grapes.  He is familiar with the patient.  He had been seeing her in clinic until the patient stopped going to the office.  Nephrology will consult on patient [JK]    Clinical Course User Index [JK] Dorie Rank, MD                           Medical Decision Making Problems Addressed: Acute renal failure, unspecified acute renal failure type [facility]): acute illness or injury that poses a threat to life or bodily functions Intertrigo: chronic illness or injury with exacerbation, progression, or side effects of treatment  Amount and/or Complexity of Data Reviewed Labs: ordered. Decision-making details documented in ED Course. Discussion of management or test interpretation with external provider(s): Case discussed with nephrology and Internal medicine service  Risk Prescription drug management. Decision regarding hospitalization.   Patient was sent to the emergency room for abnormal kidney function tests.  Labs repeated today and appear to be slightly worse.  Patient has acute renal failure with elevated BUN and creatinine.  At baseline  patient has chronic kidney disease but significantly worse today.  Patient has been on several medications that could affect her kidney function.  It is possible there could be a prerenal component as patient has had decreased p.o. intake and she has been on diuretics.  Will consult with nephrology, start IV fluids, consult medical service for admission.  Rash under breast and inguinal region most likely related to intertrigo.  Can't completely exclude cellulitis component but doubt at this time.         Final Clinical Impression(s) / ED Diagnoses Final diagnoses:  Acute renal failure, unspecified acute renal failure type [facility])  Intertrigo    Rx / DC Orders ED Discharge Orders     None         Dorie Rank, MD 05/20/22 2123

## 2022-05-20 NOTE — ED Triage Notes (Signed)
Via [facility] from home c/o generalized weakness. Pt was treated here recently and discharged to a rehab facility. She was discharged from rehab on Saturday 6/24 and since then has had worsening weakness and general malaise. She has a skin infection under her left breast and in her groin area that she has been treating with antibiotics and antifungal with no improvement. Poor PO intake x several days and her PCP advised her to come to the ER after abnormal lab work this past week. NAD on arrival, a/o x 4.

## 2022-05-20 NOTE — H&P (Signed)
Date: 05/20/2022               [name] MRN: [id number]  DOB: 15-Mar-1951 Age / Sex: 71 y.o., female   PCP: Rick Duff, MD         Medical Service: Internal Medicine Teaching Service         Attending Physician: Dr. Lottie Mussel, MD    First Contact: Dr. Romana Juniper Pager: [phone]  Second Contact: Dr. Virl Axe Pager: [phone]       After Hours (After 5p/  First Contact Pager: [phone]  weekends / holidays): Second Contact Pager: [phone]   Chief Complaint: Weakness  History of Present Illness: Alicia [name] is a 71 yo female with CKD 4, PAD, hypertension, hyperlipidemia, HFpEF, and type 2 diabetes who presents to the ED with weakness.  The patient states that she has had generalized weakness and bilateral breast pain from a skin infection for the past week. She was seen at [facility] for hospital follow up yesterday (6/29) and she reports that her daughter was called to take the patient to the ED for worsening kidney function.   She was recent at [facility] for [facility] bx, however, the patient was not able to get this done as her hospitalization was complicated by severe hypertension and a fall requiring admission. She was subsequently discharged to SNF on 5/3 and released on 6/24. She was doing well at SNF and getting stronger, however since getting home, she has felt gradually more tired with dyspnea with exertion over the past few days. Additionally, she estimates unintentionally losing about 50-60 lbs since her admission almost two months ago. She does endorse having very little appetite and nausea now, and has had poor PO intake since getting home one week ago. Even while she was at her SNF, she did not eat well because she did not like the food. Also has not been drinking or staying hydrated. The patient lives alone and also has had difficulty with cooking and cleaning on her own. She also feels her diuretics are also contributing to her weight  loss and feels she is on too much medication.   Currently, the patient feels nauseous, but has not had any episodes of vomiting. She has had a decreased urinary frequency in the past week, as well. She previously urinated every couple of hours, but states that now she is only going once, maybe twice a day with a decrease in urine volume. The patient denies any fevers, chills, night sweats, confusion, hiccups, change in taste, chest pain, abd pain, vomiting, diarrhea, hematochezia, melena, dysuria, or hematuria.    Meds:  Albuterol Amlodipine 10 mg daily Aspirin 81 mg daily Metoprolol succinate 100 mg daily Crestor 10 mg daily Januvia 25 mg daily Spironolactone 100 mg daily Lasix 40 mg daily - told to stop  yesterday Keflex 500 mg bid (started 6/29) Prn supplemental O2 No outpatient medications have been marked as taking for the 05/20/22 encounter [facility] Encounter).    Allergies: Allergies as of 05/20/2022 - Review Complete 05/20/2022  Allergen Reaction Noted   Nifedipine er Other (See Comments) 04/03/2018   Atorvastatin Swelling 04/22/2019   Latex Rash 04/03/2018   Past Medical History:  Diagnosis Date   Allergy    Arthritis    Chronic headaches    Constipation    Depression    Diabetes mellitus    GERD (gastroesophageal reflux disease)    Hyperlipidemia    Hypertension  Obesity    PAD (peripheral artery disease) (HCC)    Shortness of breath    Stage 3 chronic kidney disease ([place])    Stroke ([place])     Family History: high blood pressure, diabetes, stroke, denies family history of chronic kidney diseabe  Social History:  Lives alone in [place], was taking care of herself until recent admissions. Does not have help at home, is supposed to be set up for an aid.  Retired [employer] and moved to [place] in 2006 Smokes about 1/2 ppd for 50 years, has not smoked in the past few days. Alcohol <1 drink/week. No illicit substance use.  PCP: Dr. Eulas Post  Review of Systems: A  complete ROS was negative except as per HPI.   Physical Exam: Blood pressure (!) 144/86, pulse 90, temperature 98 F (36.7 C), temperature source Oral, resp. rate 15, height '5\' 8"'$  (1.727 m), weight 91.6 kg, SpO2 98 %. General: Pleasant, well-developed female laying in bed. No acute distress. Head: Normocephalic. Atraumatic. CV: RRR. No murmurs, rubs, or gallops. No LE edema. No JVD appreciated.  Pulmonary: Lungs CTAB. Normal work of breathing on room air. No wheezing or rales. Abdominal: Soft, nontender, nondistended. Normal bowel sounds. Extremities: Palpable radial pulses. Unable to palpate DP pulse on right, 2+ DP pulse on left. Normal ROM. Skin: Right foot is cool to touch, left foot is warm. Erythema and malodor under pannus. Erythema, warmth, and sloughing of the skin under bilateral breasts with some drainage noted.  Neuro: A&Ox3. Moves all extremities. No focal deficit. Psych: Normal mood and affect   EKG: personally reviewed my interpretation is normal sinus rhythm with LVH. Normal QT interval.   CXR: personally reviewed my interpretation is no acute cardiopulmonary disease- specifically, no pleural effusions seen.     Latest Ref Rng & Units 05/20/2022    8:19 PM 05/19/2022   11:51 AM 03/23/2022    4:54 AM  BMP  Glucose 70 - 99 mg/dL 119  179  109   BUN 8 - 23 mg/dL 95  84  48   Creatinine 0.44 - 1.00 mg/dL 7.23  6.95  2.80   BUN/Creat Ratio 12 - 28  12    Sodium 135 - 145 mmol/L 134  133  141   Potassium 3.5 - 5.1 mmol/L 4.9  5.1  4.1   Chloride 98 - 111 mmol/L 98  95  102   CO2 22 - 32 mmol/L 19  16  33   Calcium 8.9 - 10.3 mg/dL 8.3  8.7  7.6    CBC    Component Value Date/Time   WBC 10.4 05/20/2022 2019   RBC 4.16 05/20/2022 2019   HGB 12.3 05/20/2022 2019   HGB 13.8 05/19/2022 1151   HCT 36.3 05/20/2022 2019   HCT 40.6 05/19/2022 1151   PLT 267 05/20/2022 2019   PLT 296 05/19/2022 1151   MCV 87.3 05/20/2022 2019   MCV 86 05/19/2022 1151   MCH 29.6 05/20/2022  2019   MCHC 33.9 05/20/2022 2019   RDW 16.2 (H) 05/20/2022 2019   RDW 16.3 (H) 05/19/2022 1151   LYMPHSABS 0.7 03/22/2022 0449   LYMPHSABS 0.8 02/16/2022 1000   MONOABS 0.5 03/22/2022 0449   EOSABS 0.1 03/22/2022 0449   EOSABS 0.1 02/16/2022 1000   BASOSABS 0.0 03/22/2022 0449   BASOSABS 0.0 02/16/2022 1000     Assessment & Plan by Problem: Principal Problem:   Acute renal failure (ARF) ([place])  Alicia [name] is a 71 yo  female with CKD 4, PAD, hypertension, hyperlipidemia, and type 2 diabetes who presents with weakness and abnormal labs at PCP's office, and now admitted for acute renal failure.  Acute renal failure on CKD stage 4 Oliguria Patient has a hx of CKD 4, with baseline Cr reportedly around 2.3-3.3. Labs done at [facility] yesterday show progressive renal failure, with creatinine worsened to 6.95 yesterday (from 2.8 at recent hospital discharge). Cr today has increased to 7.23, although electrolytes are within normal limits. Patient has had decreased urine output and also reports unintentional weight loss (anorexia) and nausea, which could be secondary to uremia, as BUN level is elevated to 95. Renal ultrasound was performed and was consistent with medical renal disease. Right kidney was noted to be smaller than the left, but this is not a new finding. Patient received 1 L IVF in the ED. I suspect that her progressive renal failure is secondary to dehydration vs her skin infection vs progression of CKD.  - Nephrology consulted, appreciate recommendations - Urinalysis, urine Cr, and urine urea pending - Zofran 4 mg q8h prn for nausea, vomiting - Strict I&Os  - Daily BMP - Avoid nephrotoxins  Cellulitis Patient states that while she was at SNF, she was found to have a skin infection under her breasts and under her pannus. She denies any systemic signs of infection, but has pain and irritation associated with it. She was started on Keflex 500 mg bid on 6/29. Will discontinue Keflex  given her acute renal failure. Patient did receive one dose of ceftriaxone 2 g in the ED.  - Doxycycline 100 mg bid  - Clotrimazole cream twice daily under breast and pannus  HFpEF Hypertension Last echo in April with EF of 55-60% and grade II diastolic dysfunction. Patient is on metoprolol 100 mg daily, lasix 40 mg daily, spironolactone 100 mg daily, and amlodipine 10 mg daily. CXR this admission did not show any evidence of pulmonary edema/pleural effusions. - Resume home metoprolol and amlodipine - Hold lasix and spironolactone in setting of progressive renal failure - BNP pending - Strict I&Os and daily weights  Type 2 diabetes Last A1c 7.8% in May, although this could be falsely low given her chronic kidney disease. Patient is only on januvia 25 mg daily.  - Resume home januvia - CBG monitoring four times daily   Physical deconditioning Patient was recently discharged from SNF on 6/24 and has reported a limited ability to care for herself on her own. She is supposed to be getting an aide soon, however, this has not been arranged yet.  - PT/OT eval and treat  Best practices: Code: Full VTE: Heparin Diet: Clear liquids  Dispo: Admit patient to Observation with expected length of stay less than 2 midnights.  SignedDorethea Clan, DO 05/20/2022, 10:02 PM  Pager: [phone] After 5pm on weekdays and 1pm on weekends: On Call pager: [phone]

## 2022-05-20 NOTE — Assessment & Plan Note (Signed)
Patient here for hospital follow-up after recent hospitalization and discharged to SNF. Patient reports occasional shortness of breath but states she has only been using her home oxygen intermittently. Weight is down 62 pounds in the last 7 to 8 weeks. No evidence of heart failure exacerbation on exam. Patient actually dry on exam.  Plan: -Decrease Lasix from 40 mg twice daily to 40 mg daily -Continue metoprolol and spironolactone -Continue home oxygen

## 2022-05-20 NOTE — Assessment & Plan Note (Addendum)
Patient with a history of CKD 4 with baseline creatinine around somewhere around 2.3-3.3 found to have progressive renal failure on labs today. CMP showed elevation in creatinine from 2.80 at recent hospital discharge 7 weeks ago to 6.95 today. BUN also elevated from 48 to 84. Labs also significant for anion gap metabolic acidosis with K+ of 5.1 and sodium of 133. Patient dry on exam and mildly tachycardic. Skin exam shows evidence of cellulitis and possible superimposed fungal infection.  Patient recently discharged from SNF this past Saturday. Patient's laboratory findings likely secondary to severe dehydration in the setting of worsening cellulitis.  I was unable to reach patient directly so discussed laboratory findings with patient's daughter and instructed her to tell patient to go to the ER for further work-up of her progressive renal failure.  -- Instructed to go to the ER --Will need nephrology consult and admission --Hold Lasix dose today

## 2022-05-21 ENCOUNTER — Encounter (HOSPITAL_COMMUNITY): Payer: Self-pay | Admitting: Internal Medicine

## 2022-05-21 ENCOUNTER — Encounter (HOSPITAL_COMMUNITY): Payer: Medicare HMO

## 2022-05-21 DIAGNOSIS — K5900 Constipation, unspecified: Secondary | ICD-10-CM | POA: Diagnosis not present

## 2022-05-21 DIAGNOSIS — E1165 Type 2 diabetes mellitus with hyperglycemia: Secondary | ICD-10-CM | POA: Diagnosis present

## 2022-05-21 DIAGNOSIS — Y92230 Patient room in hospital as the place of occurrence of the external cause: Secondary | ICD-10-CM | POA: Diagnosis present

## 2022-05-21 DIAGNOSIS — Z91040 Latex allergy status: Secondary | ICD-10-CM | POA: Diagnosis not present

## 2022-05-21 DIAGNOSIS — M1711 Unilateral primary osteoarthritis, right knee: Secondary | ICD-10-CM | POA: Diagnosis not present

## 2022-05-21 DIAGNOSIS — E1151 Type 2 diabetes mellitus with diabetic peripheral angiopathy without gangrene: Secondary | ICD-10-CM | POA: Diagnosis present

## 2022-05-21 DIAGNOSIS — Z7982 Long term (current) use of aspirin: Secondary | ICD-10-CM | POA: Diagnosis not present

## 2022-05-21 DIAGNOSIS — Z8249 Family history of ischemic heart disease and other diseases of the circulatory system: Secondary | ICD-10-CM | POA: Diagnosis not present

## 2022-05-21 DIAGNOSIS — E872 Acidosis, unspecified: Secondary | ICD-10-CM | POA: Diagnosis present

## 2022-05-21 DIAGNOSIS — L304 Erythema intertrigo: Secondary | ICD-10-CM | POA: Diagnosis present

## 2022-05-21 DIAGNOSIS — E860 Dehydration: Secondary | ICD-10-CM | POA: Diagnosis present

## 2022-05-21 DIAGNOSIS — S80211A Abrasion, right knee, initial encounter: Secondary | ICD-10-CM | POA: Diagnosis not present

## 2022-05-21 DIAGNOSIS — M170 Bilateral primary osteoarthritis of knee: Secondary | ICD-10-CM | POA: Diagnosis present

## 2022-05-21 DIAGNOSIS — N184 Chronic kidney disease, stage 4 (severe): Secondary | ICD-10-CM | POA: Diagnosis present

## 2022-05-21 DIAGNOSIS — M25562 Pain in left knee: Secondary | ICD-10-CM | POA: Diagnosis not present

## 2022-05-21 DIAGNOSIS — I5030 Unspecified diastolic (congestive) heart failure: Secondary | ICD-10-CM | POA: Diagnosis not present

## 2022-05-21 DIAGNOSIS — I739 Peripheral vascular disease, unspecified: Secondary | ICD-10-CM | POA: Diagnosis not present

## 2022-05-21 DIAGNOSIS — N179 Acute kidney failure, unspecified: Secondary | ICD-10-CM | POA: Diagnosis present

## 2022-05-21 DIAGNOSIS — Z79899 Other long term (current) drug therapy: Secondary | ICD-10-CM | POA: Diagnosis not present

## 2022-05-21 DIAGNOSIS — Z7984 Long term (current) use of oral hypoglycemic drugs: Secondary | ICD-10-CM | POA: Diagnosis not present

## 2022-05-21 DIAGNOSIS — I509 Heart failure, unspecified: Secondary | ICD-10-CM | POA: Diagnosis not present

## 2022-05-21 DIAGNOSIS — D649 Anemia, unspecified: Secondary | ICD-10-CM | POA: Diagnosis not present

## 2022-05-21 DIAGNOSIS — R531 Weakness: Secondary | ICD-10-CM | POA: Diagnosis not present

## 2022-05-21 DIAGNOSIS — L98499 Non-pressure chronic ulcer of skin of other sites with unspecified severity: Secondary | ICD-10-CM | POA: Diagnosis present

## 2022-05-21 DIAGNOSIS — N250 Renal osteodystrophy: Secondary | ICD-10-CM | POA: Diagnosis not present

## 2022-05-21 DIAGNOSIS — I129 Hypertensive chronic kidney disease with stage 1 through stage 4 chronic kidney disease, or unspecified chronic kidney disease: Secondary | ICD-10-CM | POA: Diagnosis not present

## 2022-05-21 DIAGNOSIS — I130 Hypertensive heart and chronic kidney disease with heart failure and stage 1 through stage 4 chronic kidney disease, or unspecified chronic kidney disease: Secondary | ICD-10-CM | POA: Diagnosis present

## 2022-05-21 DIAGNOSIS — L03313 Cellulitis of chest wall: Secondary | ICD-10-CM | POA: Diagnosis present

## 2022-05-21 DIAGNOSIS — W19XXXA Unspecified fall, initial encounter: Secondary | ICD-10-CM | POA: Diagnosis not present

## 2022-05-21 DIAGNOSIS — E785 Hyperlipidemia, unspecified: Secondary | ICD-10-CM | POA: Diagnosis present

## 2022-05-21 DIAGNOSIS — Z833 Family history of diabetes mellitus: Secondary | ICD-10-CM | POA: Diagnosis not present

## 2022-05-21 DIAGNOSIS — Z888 Allergy status to other drugs, medicaments and biological substances status: Secondary | ICD-10-CM | POA: Diagnosis not present

## 2022-05-21 DIAGNOSIS — F17210 Nicotine dependence, cigarettes, uncomplicated: Secondary | ICD-10-CM | POA: Diagnosis not present

## 2022-05-21 DIAGNOSIS — Z8673 Personal history of transient ischemic attack (TIA), and cerebral infarction without residual deficits: Secondary | ICD-10-CM | POA: Diagnosis not present

## 2022-05-21 DIAGNOSIS — E1122 Type 2 diabetes mellitus with diabetic chronic kidney disease: Secondary | ICD-10-CM | POA: Diagnosis present

## 2022-05-21 DIAGNOSIS — M25561 Pain in right knee: Secondary | ICD-10-CM | POA: Diagnosis not present

## 2022-05-21 DIAGNOSIS — I5032 Chronic diastolic (congestive) heart failure: Secondary | ICD-10-CM | POA: Diagnosis present

## 2022-05-21 LAB — CBC
HCT: 34.4 % — ABNORMAL LOW (ref 36.0–46.0)
Hemoglobin: 11.5 g/dL — ABNORMAL LOW (ref 12.0–15.0)
MCH: 29.1 pg (ref 26.0–34.0)
MCHC: 33.4 g/dL (ref 30.0–36.0)
MCV: 87.1 fL (ref 80.0–100.0)
Platelets: 251 10*3/uL (ref 150–400)
RBC: 3.95 MIL/uL (ref 3.87–5.11)
RDW: 16.2 % — ABNORMAL HIGH (ref 11.5–15.5)
WBC: 8.8 10*3/uL (ref 4.0–10.5)
nRBC: 0 % (ref 0.0–0.2)

## 2022-05-21 LAB — URINALYSIS, ROUTINE W REFLEX MICROSCOPIC
Bacteria, UA: NONE SEEN
Bilirubin Urine: NEGATIVE
Glucose, UA: NEGATIVE mg/dL
Hgb urine dipstick: NEGATIVE
Ketones, ur: NEGATIVE mg/dL
Leukocytes,Ua: NEGATIVE
Nitrite: NEGATIVE
Protein, ur: 100 mg/dL — AB
Specific Gravity, Urine: 1.016 (ref 1.005–1.030)
pH: 5 (ref 5.0–8.0)

## 2022-05-21 LAB — CREATININE, URINE, RANDOM: Creatinine, Urine: 106.49 mg/dL

## 2022-05-21 LAB — BASIC METABOLIC PANEL
Anion gap: 15 (ref 5–15)
BUN: 87 mg/dL — ABNORMAL HIGH (ref 8–23)
CO2: 17 mmol/L — ABNORMAL LOW (ref 22–32)
Calcium: 7.9 mg/dL — ABNORMAL LOW (ref 8.9–10.3)
Chloride: 102 mmol/L (ref 98–111)
Creatinine, Ser: 6.63 mg/dL — ABNORMAL HIGH (ref 0.44–1.00)
GFR, Estimated: 6 mL/min — ABNORMAL LOW (ref >60)
Glucose, Bld: 96 mg/dL (ref 70–99)
Potassium: 4.5 mmol/L (ref 3.5–5.1)
Sodium: 134 mmol/L — ABNORMAL LOW (ref 135–145)

## 2022-05-21 LAB — CALCIUM: Calcium: 7.9 mg/dL — ABNORMAL LOW (ref 8.9–10.3)

## 2022-05-21 LAB — BRAIN NATRIURETIC PEPTIDE: B Natriuretic Peptide: 154.8 pg/mL — ABNORMAL HIGH (ref 0.0–100.0)

## 2022-05-21 LAB — GLUCOSE, CAPILLARY: Glucose-Capillary: 94 mg/dL (ref 70–99)

## 2022-05-21 LAB — MAGNESIUM: Magnesium: 2 mg/dL (ref 1.7–2.4)

## 2022-05-21 LAB — PHOSPHORUS: Phosphorus: 4.2 mg/dL (ref 2.5–4.6)

## 2022-05-21 MED ORDER — BARRIER CREAM NON-SPECIFIED
1.0000 | TOPICAL_CREAM | Freq: Two times a day (BID) | TOPICAL | Status: DC
Start: 2022-05-21 — End: 2022-05-21

## 2022-05-21 MED ORDER — SENNOSIDES-DOCUSATE SODIUM 8.6-50 MG PO TABS: 2.0000 | ORAL_TABLET | Freq: Every evening | ORAL | Status: DC | PRN

## 2022-05-21 MED ORDER — LACTATED RINGERS IV SOLN
INTRAVENOUS | Status: AC
Start: 2022-05-21 — End: 2022-05-21

## 2022-05-21 MED ORDER — POLYETHYLENE GLYCOL 3350 17 G PO PACK
17.0000 g | PACK | Freq: Every day | ORAL | Status: DC
  Filled 2022-05-21 (×2): qty 1

## 2022-05-21 MED ORDER — SODIUM BICARBONATE 650 MG PO TABS
1300.0000 mg | ORAL_TABLET | Freq: Two times a day (BID) | ORAL | Status: DC
  Administered 2022-05-21 – 2022-05-25 (×9): 1300 mg via ORAL
  Filled 2022-05-21 (×10): qty 2

## 2022-05-21 MED ORDER — GERHARDT'S BUTT CREAM
TOPICAL_CREAM | Freq: Two times a day (BID) | CUTANEOUS | Status: DC
Start: 2022-05-21 — End: 2022-05-25
  Filled 2022-05-21 (×2): qty 1

## 2022-05-21 NOTE — Care Management Obs Status (Signed)
[place] NOTIFICATION   Patient Details  Name: Alicia [name] MRN: [id number] Date of Birth: [birth year]   Medicare Observation Status Notification Given:  Yes    Bartholomew Crews, RN 05/21/2022, 5:18 PM

## 2022-05-21 NOTE — TOC Initial Note (Signed)
Transition of Care [facility]) - Initial/Assessment Note    Patient Details  Name: Alicia [name] MRN: [id number] Date of Birth: [birth year]  Transition of Care [facility]) CM/SW Contact:    Bartholomew Crews, RN Phone Number: [phone] 05/21/2022, 5:16 PM  Clinical Narrative:                  Spoke with patient at the bedside to discuss post acute transition. PTA home alone. Reports that [facility] will be starting aide services soon. Discussed HH PT/OT - agreeable. Offered choice of [place] agency - referral accepted by [facility]. Patient will need [facility] PT/OT orders with Face to Face. Stated that she has a neighbor who will pick her up at discharge. TOC following for transition needs.   Expected Discharge Plan: [place] Barriers to Discharge: Continued Medical Work up   Patient Goals and CMS Choice Patient states their goals for this hospitalization and ongoing recovery are:: return home CMS Medicare.gov Compare Post Acute Care list provided to:: Patient Choice offered to / list presented to : Patient  Expected Discharge Plan and Services Expected Discharge Plan: [place]   Discharge Planning Services: CM Consult Post Acute Care Choice: [place] arrangements for the past 2 months: Apartment                 DME Arranged: N/A DME Agency: NA       HH Arranged: PT, OT HH Agency: [place] Date [place]: 05/21/22 Time [place]: 78 Representative spoke with at [place]: Adela Lank  Prior Living Arrangements/Services Living arrangements for the past 2 months: Apartment Lives with:: Self Patient language and need for interpreter reviewed:: Yes Do you feel safe going back to the place where you live?: Yes      Need for Family Participation in Patient Care: No (Comment)   Current home services: DME Criminal Activity/Legal Involvement Pertinent to Current Situation/Hospitalization: No - Comment as  needed  Activities of Daily Living Home Assistive Devices/Equipment: Walker (specify type), Cane (specify quad or straight), Dentures (specify type), Blood pressure cuff, Eyeglasses, Scales, CBG Meter ADL Screening (condition at time of admission) Patient's cognitive ability adequate to safely complete daily activities?: Yes Is the patient deaf or have difficulty hearing?: No Does the patient have difficulty seeing, even when wearing glasses/contacts?: No Does the patient have difficulty concentrating, remembering, or making decisions?: No Patient able to express need for assistance with ADLs?: Yes Does the patient have difficulty dressing or bathing?: Yes Independently performs ADLs?: Yes (appropriate for developmental age) Does the patient have difficulty walking or climbing stairs?: Yes Weakness of Legs: Both Weakness of Arms/Hands: Both  Permission Sought/Granted                  Emotional Assessment Appearance:: Appears stated age Attitude/Demeanor/Rapport: Engaged Affect (typically observed): Accepting Orientation: : Oriented to Self, Oriented to Place, Oriented to  Time, Oriented to Situation Alcohol / Substance Use: Not Applicable Psych Involvement: No (comment)  Admission diagnosis:  Intertrigo [L30.4] Acute renal failure (ARF) (HCC) [N17.9] Acute renal failure, unspecified acute renal failure type ([place]) [N17.9] Patient Active Problem List   Diagnosis Date Noted   Intertrigo    Metabolic acidosis    Cellulitis 05/20/2022   Vitamin D deficiency 05/20/2022   Acute renal failure (ARF) ([place]) 05/20/2022   Headache 02/16/2022   Disequilibrium 12/01/2021   (HFpEF) heart failure with preserved ejection fraction ([place]) 07/18/2021  Exacerbation of RAD (reactive airway disease) 02/27/2020   Stage 4 chronic kidney disease ([place]) 02/27/2020   Tobacco use 02/27/2020   Prolonged Q-T interval on ECG 04/27/2018   Diabetes mellitus type 2 in obese ([place]) 03/16/2018   Severe  obesity (BMI >= 40) (HCC)    CVA (cerebral vascular accident) ([place]) 03/15/2018   Peripheral vascular disease, unspecified ([place]) 11/30/2012   Hyperlipidemia 07/20/2007   Primary hypertension 07/20/2007   PCP:  Rick Duff, MD Pharmacy:   [place] ([place]), [place] [address] [place] ([place]) [place] [address] Phone: [phone] Fax: [phone]     Social Determinants of Health (SDOH) Interventions    Readmission Risk Interventions    03/02/2020    1:08 PM  Readmission Risk Prevention Plan  Transportation Screening Complete  PCP or Specialist Appt within 5-7 Days Complete  Home Care Screening Complete  Medication Review (RN CM) Complete

## 2022-05-21 NOTE — Evaluation (Signed)
Physical Therapy Evaluation Patient Details Name: Alicia [name] MRN: [id number] DOB: 1951/04/23 Today's Date: 05/21/2022  History of Present Illness  71 y/o female presented to ED on 05/20/22 for generalized weakness and bilateral breast pain from skin infection after being discharged from rehab on 6/24. Admitted for acute renal failure and and cellulitis of breast and pannus. PMH: CHF, HTN, PVD, chronic respiratory failure on 2L supplemental oxygen, hx of CVA, T2DM, CKD4.  Clinical Impression  Patient admitted with the above. PTA, patient recently sent home from SNF and living alone using RW and HHPT. Patient currently presents with weakness, impaired balance, and decreased activity tolerance. Patient required minA for sit to stand and step pivot transfers to/from [facility]. Patient declined further ambulation due to fatigue. Patient prefers to return home rather than go back to rehab. Patient will benefit from skilled PT services during acute stay to address listed deficits. Recommend HHPT at discharge to maximize functional mobility and safety.        Recommendations for follow up therapy are one component of a multi-disciplinary discharge planning process, led by the attending physician.  Recommendations may be updated based on patient status, additional functional criteria and insurance authorization.  Follow Up Recommendations Home health PT      Assistance Recommended at Discharge Intermittent Supervision/Assistance  Patient can return home with the following  A little help with walking and/or transfers;A little help with bathing/dressing/bathroom;Assistance with cooking/housework;Assist for transportation;Help with stairs or ramp for entrance    Equipment Recommendations None recommended by PT  Recommendations for Other Services       Functional Status Assessment Patient has had a recent decline in their functional status and demonstrates the ability to make significant improvements  in function in a reasonable and predictable amount of time.     Precautions / Restrictions Precautions Precautions: Fall Restrictions Weight Bearing Restrictions: No      Mobility  Bed Mobility Overal bed mobility: Needs Assistance Bed Mobility: Supine to Sit     Supine to sit: Supervision     General bed mobility comments: supervision for safety. Left patient seated on EOB at end of session    Transfers Overall transfer level: Needs assistance Equipment used: 1 person hand held assist Transfers: Sit to/from Stand, Bed to chair/wheelchair/BSC Sit to Stand: Min assist   Step pivot transfers: Min assist       General transfer comment: assist to rise and steady and able to take steps towards BSC with minA for balance    Ambulation/Gait               General Gait Details: patient declined ambulation this date due to fatigue  Stairs            Wheelchair Mobility    Modified Rankin (Stroke Patients Only)       Balance Overall balance assessment: Needs assistance Sitting-balance support: No upper extremity supported, Feet supported Sitting balance-Leahy Scale: Good     Standing balance support: Single extremity supported, During functional activity Standing balance-Leahy Scale: Poor Standing balance comment: minA for balance                             Pertinent Vitals/Pain Pain Assessment Pain Assessment: No/denies pain    Home Living Family/patient expects to be discharged to:: Private residence Living Arrangements: Alone Available Help at Discharge: Family;Available PRN/intermittently Type of Home: Apartment Home Access: Stairs to enter Entrance Stairs-Rails: Right;Left Entrance Stairs-Number of Steps:  2   Home Layout: One level Home Equipment: Curator (4 wheels);Rolling [place]at Shek (2 wheels);Cane - quad;Cane - single point;Tub bench Additional Comments: uses 2L O2 at home as needed    Prior Function  Prior Level of Function : Independent/Modified Independent;History of Falls (last six months);Driving             Mobility Comments: reports using rollator for short trips at home, unable to take it out with her due to wgt of device but states she drives herself ADLs Comments: reports independent ADLs, limited IADLs with increased time required for all tasks     Hand Dominance   Dominant Hand: Right    Extremity/Trunk Assessment   Upper Extremity Assessment Upper Extremity Assessment: Defer to OT evaluation    Lower Extremity Assessment Lower Extremity Assessment: Generalized weakness    Cervical / Trunk Assessment Cervical / Trunk Assessment: Kyphotic;Other exceptions Cervical / Trunk Exceptions: Body habitus  Communication   Communication: No difficulties  Cognition Arousal/Alertness: Awake/alert Behavior During Therapy: WFL for tasks assessed/performed Overall Cognitive Status: Within Functional Limits for tasks assessed                                          General Comments General comments (skin integrity, edema, etc.): VSS on RA    Exercises     Assessment/Plan    PT Assessment Patient needs continued PT services  PT Problem List Decreased strength;Decreased activity tolerance;Decreased balance;Decreased mobility;Decreased knowledge of use of DME;Decreased safety awareness;Decreased knowledge of precautions       PT Treatment Interventions Gait training;DME instruction;Functional mobility training;Stair training;Therapeutic activities;Therapeutic exercise;Balance training;Patient/family education    PT Goals (Current goals can be found in the Care Plan section)  Acute Rehab PT Goals Patient Stated Goal: to go home and not rehab PT Goal Formulation: With patient Time For Goal Achievement: 06/04/22 Potential to Achieve Goals: Good    Frequency Min 3X/week     Co-evaluation               AM-PAC PT "6 Clicks" Mobility   Outcome Measure Help needed turning from your back to your side while in a flat bed without using bedrails?: A Little Help needed moving from lying on your back to sitting on the side of a flat bed without using bedrails?: A Little Help needed moving to and from a bed to a chair (including a wheelchair)?: A Little Help needed standing up from a chair using your arms (e.g., wheelchair or bedside chair)?: A Little Help needed to walk in hospital room?: A Lot Help needed climbing 3-5 steps with a railing? : A Lot 6 Click Score: 16    End of Session   Activity Tolerance: Patient tolerated treatment well Patient left: in bed;with call bell/phone within reach Nurse Communication: Mobility status PT Visit Diagnosis: Unsteadiness on feet (R26.81);Muscle weakness (generalized) (M62.81);Difficulty in walking, not elsewhere classified (R26.2)    Time: 5809-9833 PT Time Calculation (min) (ACUTE ONLY): 22 min   Charges:   PT Evaluation $PT Eval Low Complexity: 1 Low          Kavir Savoca A. Gilford Rile PT, DPT Acute Rehabilitation Services Office [phone]   Linna Hoff 05/21/2022, 1:05 PM

## 2022-05-21 NOTE — Evaluation (Signed)
Occupational Therapy Evaluation Patient Details Name: Alicia [name] MRN: [id number] DOB: [birth year] Today's Date: 05/21/2022   History of Present Illness 71 y/o female presented to ED on 05/20/22 for generalized weakness and bilateral breast pain from skin infection after being discharged from rehab on 6/24. Admitted for acute renal failure and and cellulitis of breast and pannus. PMH: CHF, HTN, PVD, chronic respiratory failure on 2L supplemental oxygen, hx of CVA, T2DM, CKD4.   Clinical Impression   Patient admitted for the diagnosis above.  Patient with recent admit to local SNF for short term rehab, returning home 6/24 with recommendation for continued [facility] OT and PT.  Patient's goal is to return home and begin [facility] rehab.  Unfortunately she is self limiting out of bed this date due to perceived fatigue and weakness.  Encouraged mobility to prevent further weakness, but patient received lunch tray and wanting to eat.  OOB to be assessed, she has no family assist at home.  OT will follow in the acute setting, and [place] OT will be recommended per patient's request.         Recommendations for follow up therapy are one component of a multi-disciplinary discharge planning process, led by the attending physician.  Recommendations may be updated based on patient status, additional functional criteria and insurance authorization.   Follow Up Recommendations  Home health OT    Assistance Recommended at Discharge Intermittent Supervision/Assistance  Patient can return home with the following A little help with walking and/or transfers;A little help with bathing/dressing/bathroom;Help with stairs or ramp for entrance;Assist for transportation;Assistance with cooking/housework    Functional Status Assessment  Patient has had a recent decline in their functional status and demonstrates the ability to make significant improvements in function in a reasonable and predictable amount of time.  Equipment  Recommendations  None recommended by OT    Recommendations for Other Services       Precautions / Restrictions Precautions Precautions: Fall Restrictions Weight Bearing Restrictions: No      Mobility Bed Mobility Overal bed mobility: Needs Assistance Bed Mobility: Rolling Rolling: Supervision         General bed mobility comments: patient declining OOB, but able to reposition herself higher in the bed without assist.    Transfers                   General transfer comment: OOB and mobility needs to be assessed.      Balance                                           ADL either performed or assessed with clinical judgement   ADL   Eating/Feeding: Independent;Bed level   Grooming: Wash/dry hands;Wash/dry face;Set up;Bed level           Upper Body Dressing : Set up;Bed level   Lower Body Dressing: Minimal assistance;Bed level Lower Body Dressing Details (indicate cue type and reason): long sitting in bed, able to reach feet for placing socks   Toilet Transfer Details (indicate cue type and reason): declining OOB                 Vision Patient Visual Report: No change from baseline       Perception Perception Perception: Within Functional Limits   Praxis Praxis Praxis: Intact    Pertinent Vitals/Pain Pain Assessment Pain Assessment: No/denies pain  Hand Dominance Right   Extremity/Trunk Assessment Upper Extremity Assessment Upper Extremity Assessment: Generalized weakness   Lower Extremity Assessment Lower Extremity Assessment: Defer to PT evaluation   Cervical / Trunk Assessment Cervical / Trunk Assessment: Kyphotic;Other exceptions Cervical / Trunk Exceptions: Body habitus   Communication Communication Communication: No difficulties   Cognition Arousal/Alertness: Awake/alert Behavior During Therapy: WFL for tasks assessed/performed Overall Cognitive Status: Within Functional Limits for tasks  assessed                                       General Comments   VSS on RA    Exercises     Shoulder Instructions      Home Living Family/patient expects to be discharged to:: Private residence Living Arrangements: Alone Available Help at Discharge: Family;Available PRN/intermittently Type of Home: Apartment Home Access: Stairs to enter Entrance Stairs-Number of Steps: 2 Entrance Stairs-Rails: Right;Left Home Layout: One level     Bathroom Shower/Tub: Teacher, early years/pre: Standard     Home Equipment: Curator (4 wheels);Rolling Walker (2 wheels);Cane - quad;Cane - single point;Tub bench   Additional Comments: uses 2L O2 at home as needed      Prior Functioning/Environment Prior Level of Function : Independent/Modified Independent;History of Falls (last six months);Driving             Mobility Comments: reports using rollator for short trips at home, unable to take it out with her due to wgt of device but states she drives herself ADLs Comments: reports independent ADLs, limited IADLs with increased time required for all tasks        OT Problem List: Decreased strength;Decreased activity tolerance;Impaired balance (sitting and/or standing)      OT Treatment/Interventions: Self-care/ADL training;Therapeutic exercise;Therapeutic activities;Patient/family education;Balance training    OT Goals(Current goals can be found in the care plan section) Acute Rehab OT Goals Patient Stated Goal: Return home OT Goal Formulation: With patient Time For Goal Achievement: 06/03/22 Potential to Achieve Goals: Good ADL Goals Pt Will Perform Grooming: with set-up;standing Pt Will Perform Upper Body Dressing: with set-up;sitting Pt Will Perform Lower Body Dressing: with set-up;sit to/from stand Pt Will Transfer to Toilet: with modified independence;ambulating;regular height toilet Pt/caregiver will Perform Home Exercise  Program: Increased strength;Both right and left upper extremity;With theraband;With Supervision  OT Frequency: Min 2X/week    Co-evaluation              AM-PAC OT "6 Clicks" Daily Activity     Outcome Measure Help from another person eating meals?: None Help from another person taking care of personal grooming?: None Help from another person toileting, which includes using toliet, bedpan, or urinal?: A Little Help from another person bathing (including washing, rinsing, drying)?: A Little Help from another person to put on and taking off regular upper body clothing?: A Little Help from another person to put on and taking off regular lower body clothing?: A Little 6 Click Score: 20   End of Session Nurse Communication: Other (comment) (limited eval)  Activity Tolerance: Other (comment) (limited assessment) Patient left: in bed;with call bell/phone within reach  OT Visit Diagnosis: Unsteadiness on feet (R26.81);Muscle weakness (generalized) (M62.81)                Time: 7902-4097 OT Time Calculation (min): 16 min Charges:  OT General Charges $OT Visit: 1 Visit OT Evaluation $OT Eval Moderate Complexity: 1 Mod  05/21/2022  RP, OTR/L  Acute Rehabilitation Services  Office:  [phone]   Metta Clines 05/21/2022, 12:24 PM

## 2022-05-21 NOTE — Progress Notes (Signed)
New Admission Note:   Arrival Method: stretcher Mental Orientation: ax4 Telemetry: box7 Assessment: Completed Skin: see flowsheet IV: nsl Pain: none Tubes: none Safety Measures: Safety Fall Prevention Plan has been discussed Admission: Completed [facility] [place] Orientation: Patient has been orientated to the room, unit and staff.  Family: none at bedside  Orders have been reviewed and implemented. Will continue to monitor the patient. Call light has been placed within reach and bed alarm has been activated.   Rockie Neighbours BSN, RN Phone number: [phone]

## 2022-05-21 NOTE — Hospital Course (Addendum)
States she feels alright, a little better after getting to hospital. Does not feel like eating or drinking. Not feeling confused. Still feeling a little dizzy.   Has not passed a BM yet. She is having pain under her breasts at site of infection.

## 2022-05-21 NOTE — Progress Notes (Signed)
Hospital day#0 Subjective:     Overnight Events: None  Patient feeling tired and weak, endorsing lack of appetite, low PO intake overall, dyspnea on exertion, decreased urine output, or recent bowel movements. Denies nausea, vomiting, or changes in mentation.  Objective:  Vital signs in last 24 hours: Vitals:   05/20/22 2200 05/21/22 0000 05/21/22 0524 05/21/22 1058  BP: (!) 144/86 (!) 149/73 (!) 144/76 130/64  Pulse: 90 96 93 95  Resp: '15 16 16   '$ Temp:  98.4 F (36.9 C) 98.7 F (37.1 C) 98.4 F (36.9 C)  TempSrc:  Oral Oral Oral  SpO2: 98% 100% 100% 98%  Weight:      Height:       Supplemental O2: Room Air SpO2: 98 %   Physical Exam:  Constitutional: ill-appearing woman, reclining in bed. HENT: No dentures in place - patient's mouth was stained bright red-orange colored without signs of bleeding. Eyes: conjunctiva non-erythematous Cardiovascular: regular rate and rhythm Pulmonary/Chest: normal work of breathing on room air, lungs clear to auscultation bilaterally Abdominal: soft, non-tender, non-distended MSK: no deformities observed Neurological: alert & oriented x 3. No asterixis identified on exam. R hand with slight tremor Skin: Severe painful erythema and open skin under bilateral breast oozing serous fluid. Psych: Engaged in conversation with some hesitancy as she was uncomfortable without her dentures. Otherwise, appropriate mood and affect  Filed Weights   05/20/22 1841  Weight: 91.6 kg     Intake/Output Summary (Last 24 hours) at 05/21/2022 1438 Last data filed at 05/21/2022 0756 Gross per 24 hour  Intake 1460 ml  Output 250 ml  Net 1210 ml   Net IO Since Admission: 1,210 mL [05/21/22 1438]  Pertinent Labs:    Latest Ref Rng & Units 05/21/2022   12:28 AM 05/20/2022    8:19 PM 05/19/2022   11:51 AM  CBC  WBC 4.0 - 10.5 K/uL 8.8  10.4  13.9   Hemoglobin 12.0 - 15.0 g/dL 11.5  12.3  13.8   Hematocrit 36.0 - 46.0 % 34.4  36.3  40.6   Platelets 150  - 400 K/uL 251  267  296        Latest Ref Rng & Units 05/21/2022    7:58 AM 05/21/2022   12:28 AM 05/20/2022    8:19 PM  CMP  Glucose 70 - 99 mg/dL  96  119   BUN 8 - 23 mg/dL  87  95   Creatinine 0.44 - 1.00 mg/dL  6.63  7.23   Sodium 135 - 145 mmol/L  134  134   Potassium 3.5 - 5.1 mmol/L  4.5  4.9   Chloride 98 - 111 mmol/L  102  98   CO2 22 - 32 mmol/L  17  19   Calcium 8.9 - 10.3 mg/dL 7.9  7.9  8.3     Imaging: US RENAL  Result Date: 05/20/2022 CLINICAL DATA:  Acute renal failure. EXAM: RENAL / URINARY TRACT ULTRASOUND COMPLETE COMPARISON:  Renal ultrasound 12/02/2021. FINDINGS: Right Kidney: Renal measurements: 9.6 x 4.7 x 4.6 cm = volume: 108.0 mL, previously 108.6 mL. The renal cortex is slightly echogenic but it is less hyperechoic than previously. No mass, stone or hydronephrosis visualized. Left Kidney: Renal measurements: 11.1 x 4.5 x 5.0 cm = volume: 130.0 mL, previously 198.2 mL. The renal cortex is slightly echogenic but unchanged. No mass, stone or hydronephrosis visualized. Bladder: Contracted and not seen. Other: None. IMPRESSION: 1. Slightly increased bilateral renal cortical echogenicity,  unchanged on the left and improved on the right consistent with medical renal disease. 2. Right kidney again noted smaller than left. 3. Bladder contracted and nonvisualized. Electronically Signed   By: Telford Nab M.D.   On: 05/20/2022 23:19   DG CHEST PORT 1 VIEW  Result Date: 05/20/2022 CLINICAL DATA:  [id number].  WEAKNESS; HX OF ARF EXAM: PORTABLE CHEST 1 VIEW COMPARISON:  Chest x-ray 03/14/2022 FINDINGS: Cardiomegaly. The heart and mediastinal contours are unchanged. Aortic calcification. No focal consolidation. No pulmonary edema. No pleural effusion. No pneumothorax. No acute osseous abnormality. Severe degenerative changes of the right shoulder with heterotopic calcification. IMPRESSION: 1. Cardiomegaly with no acute cardiopulmonary abnormality. Limited evaluation for pericardial  effusion on the AP portable technique. 2.  Aortic Atherosclerosis (ICD10-I70.0). Electronically Signed   By: Iven Finn M.D.   On: 05/20/2022 22:32    Assessment/Plan:   Principal Problem:   Acute renal failure (ARF) ([place]) Active Problems:   Primary hypertension   Stage 4 chronic kidney disease ([place])   Intertrigo   Metabolic acidosis   Patient Summary: Alicia [name] is a 71 YO female with a PMHx of CKD4 (Baseline Cr 2.8-3), PAD, HTN, HLD, CHF, DMT2 who presented to [facility] with weakness, dizziness, and bilateral under breast skin infection admitted for AKI   #AKI #CKD 4 #Oliguria Current creatinine 7.9, improving. AKI likely in the setting of dehydration and poor PO intake. Other things to consider are advancement of renal disease given CKD4 and renal U/S findings.  - Baseline Cr 2.3-3.3 - Admission Cr 8.3 - Nephrology consulted, appreciate recommendations --Continue IVF LR 100cc/hr x 10hr. Stop if hypoxia  --Continue to hold Lasix and spironolactone --Monitor Cr daily --Straight I/O --Renally dose medications for GFR and avoid nephrotoxic drugs --Use synthetic opiods if needed --No hemodialysis at this time --Maintain MAP >65 for renal perfusion  #Metabolic acidosis - Admission GAP 17, most recent 15 after IVF administration likely in the setting of AKI. - Per nephrology recommendations, will start on NaBicarbonate 1300 mg twice a day - Continue to monitor BMP   #HTN Systolics in 294-765Y MAP 85-32 Goal MAP >65 - Continue home metoprolol and amlodipine  #Skin wound #Cellulitis  Localized pain. No systemic signs of infections Severe, painful erythema and open skin under bilateral breast with serous fluid. Previously on Keflex 500 mg BID on 6/29, now discharged given acute kidney failure.  -  Started on doxyciline 100 mg BID - Continue clotrimazole cream twice a day - Add barrier cream and pads for comfort   #HFpEF Last echo 02/2022 with EF 55-60%  and grade II diastolic dysfunction [id number] CXR on admission - no evidence of pulmonary edema or pleural effusions - Hold lasixs ('40mg'$  daily) and Spironolactone (10 mg daily) - Straight in/outs and weight  #T2DM Last A1C: 7.8% on 03/2022 On Januvia, continue -CBG monitoring now q4hr  #Deconditioning Recently discharged from SNF on 6/24 with limited ability for ADLs. - PT/OT consult  #Red-stained oral mucosa Unclear cause - no evidence of bleeding, or apparent lesions. Patient denied pain. Patient received a mouth rinse to see if this changes. - Will continue monitor - Consider vitamin deficiencies  Diet:  Clear liquids VTE: Heparin Code: Full PT/OT recs: Pending   Dispo: Anticipated discharge to  pending  based on hospital course  Romana Juniper, MD Internal Medicine Resident PGY-1 Please contact the on call pager after 5 pm and on weekends at [phone].

## 2022-05-21 NOTE — Consult Note (Signed)
Nephrology Consult   Requesting provider: Lottie Mussel Service requesting consult: Int Med Reason for consult: AKI   Assessment/Recommendations: Alicia [name] is a/an 71 y.o. female with a past medical history CKD 4, PAD, HTN, HLD, CHF, DM 2 who present w/ weakness and AKI   AKI on CKD 4: unclear amount of UOP. Likely secondary to dehydration. Crt improving -Crt improving today -Continue w/ IVFs LR 100cc/hr x 10 hrs; can stop for hypoxia -Agree with holding lasix and spiro -If she has uremic symptoms they are likely minimal -Continue to monitor daily Cr, Dose meds for GFR -Monitor Daily I/Os, Daily weight  -Maintain MAP>65 for optimal renal perfusion.  -Avoid nephrotoxic medications including NSAIDs -Use synthetic opioids (Fentanyl/Dilaudid) if needed -Currently no indication for HD -Need f/u w/ me at DC  [place] acidosis: Likely associated with AKI.  Start sodium bicarbonate 1300 mg twice daily  Hypertension: Continue home amlodipine and metoprolol.  Holding Lasix and spironolactone  Cellulitis: on doxy. Mgmt per priamry  HFpEF: appears volume depleted. Hold lasix  Uncontrolled Diabetes Mellitus Type 2 with Hyperglycemia: mgmt per primary    Recommendations conveyed to primary service.    [facility] 05/21/2022 9:37 AM   _____________________________________________________________________________________ CC: AKI, weakness  History of Present Illness: Alicia [name] is a/an 71 y.o. female with a past medical history of CKD 4, PAD, HTN, HLD, CHF, DM 2 who presents with weakness.  Patient presented to the hospital yesterday for ongoing weakness for the past week.  She was seen in the internal medicine clinic on 6/29 for the same symptoms and labs that day demonstrated creatinine in the sixes.  Because of these abnormal labs it was recommended she come to the emergency department.  Patient was recently hospitalized after having  significant hypertension around the time of the procedure.  She was discharged to a SNF in May and left the SNF on 6/24.  She felt like she was getting stronger but since getting home she was getting more more weak.  She has also noticed significant weight loss and decreased appetite.  She was having dyspnea on exertion at home as well.  The patient has had a hard time at home completing her daily tasks.  Patient has also been having some nausea but no vomiting.  She has noticed that she has not been urinating quite as much and definitely has not been drinking as much.  Denies any NSAID use.  No chest pain.  On arrival to the emergency department she was found to have a creatinine of 7.2.  There is also concern for cellulitis and she was admitted for management of AKI and cellulitis.  Today feeling about the same.  I saw the patient in clinic previously.  Last seen in April 2022.  The patient has not followed up since that time.  She has expressed to some providers that she wanted a different kidney doctor.  Is unclear why when I talked to her today.  It seems that she did not remember me.  She has expressed to me in the past that she has some distress with the medical system and has a hard time believing in her providers.  Her daughter had a bad experience with a medical provider in the past that led to her death which is likely contributed to this.  Patient states that she would be willing to see me in the outpatient setting after she leaves the hospital.  Her creatinine is worsened significantly over  the past year with a baseline that was previously around 1-1/2 now at 2.8-3.2.   Medications:  Current Facility-Administered Medications  Medication Dose Route Frequency Provider Last Rate Last Admin   acetaminophen (TYLENOL) tablet 650 mg  650 mg Oral Q6H PRN Atway, Rayann N, DO       Or   acetaminophen (TYLENOL) suppository 650 mg  650 mg Rectal Q6H PRN Atway, Rayann N, DO       albuterol (PROVENTIL)  (2.5 MG/3ML) 0.083% nebulizer solution 2.5 mg  2.5 mg Nebulization Q6H PRN Lottie Mussel, MD       amLODipine (NORVASC) tablet 10 mg  10 mg Oral Daily Atway, Rayann N, DO       aspirin EC tablet 81 mg  81 mg Oral QPM Atway, Rayann N, DO   81 mg at 05/20/22 2245   clotrimazole (LOTRIMIN) 1 % cream   Topical BID Atway, Rayann N, DO   Given at 05/21/22 0201   doxycycline (VIBRA-TABS) tablet 100 mg  100 mg Oral Q12H Atway, Rayann N, DO   100 mg at 05/20/22 2245   heparin injection 5,000 Units  5,000 Units Subcutaneous Q8H Atway, Rayann N, DO   5,000 Units at 05/21/22 0350   lactated ringers infusion   Intravenous Continuous Reesa Chew, MD       linagliptin (TRADJENTA) tablet 5 mg  5 mg Oral Daily Atway, Rayann N, DO       metoprolol succinate (TOPROL-XL) 24 hr tablet 100 mg  100 mg Oral Daily Atway, Rayann N, DO       ondansetron (ZOFRAN) tablet 4 mg  4 mg Oral Q8H PRN Iona Beard, MD       rosuvastatin (CRESTOR) tablet 10 mg  10 mg Oral QPM Atway, Rayann N, DO   10 mg at 05/20/22 2245   senna-docusate (Senokot-S) tablet 1 tablet  1 tablet Oral QHS PRN Atway, Rayann N, DO       sodium bicarbonate tablet 1,300 mg  1,300 mg Oral BID Reesa Chew, MD         ALLERGIES Nifedipine er, Atorvastatin, and Latex  MEDICAL HISTORY Past Medical History:  Diagnosis Date   Allergy    Arthritis    Chronic headaches    Constipation    Depression    Diabetes mellitus    GERD (gastroesophageal reflux disease)    Hyperlipidemia    Hypertension    Obesity    PAD (peripheral artery disease) (HCC)    Shortness of breath    Stage 3 chronic kidney disease ([place])    Stroke [facility])      SOCIAL HISTORY Social History   Socioeconomic History   Marital status: Single    Spouse name: Not on file   Number of children: Not on file   Years of education: Not on file   Highest education level: Not on file  Occupational History   Not on file  Tobacco Use   Smoking status: Light Smoker     Packs/day: 0.50    Years: 35.00    Total pack years: 17.50    Types: Cigarettes   Smokeless tobacco: Never   Tobacco comments:    patient is trying to quit and has gone down to half a pack per day  Vaping Use   Vaping Use: Never used  Substance and Sexual Activity   Alcohol use: Yes    Alcohol/week: 1.0 standard drink of alcohol    Types: 1 Standard drinks or equivalent per  week    Comment: occasional use only   Drug use: No   Sexual activity: Not Currently  Other Topics Concern   Not on file  Social History Narrative   Not on file   Social Determinants of Health   Financial Resource Strain: Not on file  Food Insecurity: No Food Insecurity (07/28/2021)   Hunger Vital Sign    Worried About Running Out of Food in the Last Year: Never true    Ran Out of Food in the Last Year: Never true  Transportation Needs: Unmet Transportation Needs (07/28/2021)   PRAPARE - Transportation    Lack of Transportation (Medical): Yes    Lack of Transportation (Non-Medical): Yes  Physical Activity: Inactive (07/28/2021)   Exercise Vital Sign    Days of Exercise per Week: 0 days    Minutes of Exercise per Session: 0 min  Stress: Not on file  Social Connections: Not on file  Intimate Partner Violence: Not on file     FAMILY HISTORY Family History  Problem Relation Age of Onset   Diabetes Father    Heart disease Mother        NOT before age 50-  Bypass   Hypertension Mother    Hypertension Sister    Varicose Veins Brother    Heart disease Brother        Before age 50   Hypertension Daughter    Colon cancer Neg Hx    Esophageal cancer Neg Hx    Rectal cancer Neg Hx    Stomach cancer Neg Hx       Review of Systems: 12 systems reviewed Otherwise as per HPI, all other systems reviewed and negative  Physical Exam: Vitals:   05/21/22 0000 05/21/22 0524  BP: (!) 149/73 (!) 144/76  Pulse: 96 93  Resp: 16 16  Temp: 98.4 F (36.9 C) 98.7 F (37.1 C)  SpO2: 100% 100%   Total I/O In:  360 [P.O.:360] Out: -   Intake/Output Summary (Last 24 hours) at 05/21/2022 1937 Last data filed at 05/21/2022 0756 Gross per 24 hour  Intake 1460 ml  Output 250 ml  Net 1210 ml   General: well-appearing, no acute distress HEENT: anicteric sclera, oropharynx clear without lesions CV: regular rate, no edema Lungs: clear to auscultation bilaterally, normal work of breathing Abd: soft, non-tender, non-distended Skin: no visible lesions or rashes Psych: alert, engaged, appropriate mood and affect Musculoskeletal: no obvious deformities Neuro: normal speech, no gross focal deficits, slight tremor of the right hand, minimal asterixis if any  Test Results Reviewed Lab Results  Component Value Date   NA 134 (L) 05/21/2022   K 4.5 05/21/2022   CL 102 05/21/2022   CO2 17 (L) 05/21/2022   BUN 87 (H) 05/21/2022   CREATININE 6.63 (H) 05/21/2022   CALCIUM 7.9 (L) 05/21/2022   ALBUMIN 3.5 (L) 05/19/2022   PHOS 4.2 05/21/2022    CBC Recent Labs  Lab 05/19/22 1151 05/20/22 2019 05/21/22 0028  WBC 13.9* 10.4 8.8  HGB 13.8 12.3 11.5*  HCT 40.6 36.3 34.4*  MCV 86 87.3 87.1  PLT 296 267 251    I have reviewed all relevant outside healthcare records related to the patient's current hospitalization

## 2022-05-22 ENCOUNTER — Encounter (HOSPITAL_COMMUNITY): Payer: Medicare HMO

## 2022-05-22 ENCOUNTER — Inpatient Hospital Stay (HOSPITAL_COMMUNITY): Payer: Medicare HMO

## 2022-05-22 LAB — RENAL FUNCTION PANEL
Albumin: 2.4 g/dL — ABNORMAL LOW (ref 3.5–5.0)
Anion gap: 14 (ref 5–15)
BUN: 76 mg/dL — ABNORMAL HIGH (ref 8–23)
CO2: 18 mmol/L — ABNORMAL LOW (ref 22–32)
Calcium: 7.9 mg/dL — ABNORMAL LOW (ref 8.9–10.3)
Chloride: 103 mmol/L (ref 98–111)
Creatinine, Ser: 5.71 mg/dL — ABNORMAL HIGH (ref 0.44–1.00)
GFR, Estimated: 7 mL/min — ABNORMAL LOW (ref >60)
Glucose, Bld: 98 mg/dL (ref 70–99)
Phosphorus: 3.5 mg/dL (ref 2.5–4.6)
Potassium: 3.9 mmol/L (ref 3.5–5.1)
Sodium: 135 mmol/L (ref 135–145)

## 2022-05-22 LAB — CBC WITH DIFFERENTIAL/PLATELET
Abs Immature Granulocytes: 0.05 10*3/uL (ref 0.00–0.07)
Basophils Absolute: 0 10*3/uL (ref 0.0–0.1)
Basophils Relative: 0 %
Eosinophils Absolute: 0.1 10*3/uL (ref 0.0–0.5)
Eosinophils Relative: 2 %
HCT: 31.9 % — ABNORMAL LOW (ref 36.0–46.0)
Hemoglobin: 10.6 g/dL — ABNORMAL LOW (ref 12.0–15.0)
Immature Granulocytes: 1 %
Lymphocytes Relative: 13 %
Lymphs Abs: 0.9 10*3/uL (ref 0.7–4.0)
MCH: 29 pg (ref 26.0–34.0)
MCHC: 33.2 g/dL (ref 30.0–36.0)
MCV: 87.4 fL (ref 80.0–100.0)
Monocytes Absolute: 0.6 10*3/uL (ref 0.1–1.0)
Monocytes Relative: 9 %
Neutro Abs: 5.1 10*3/uL (ref 1.7–7.7)
Neutrophils Relative %: 75 %
Platelets: 338 10*3/uL (ref 150–400)
RBC: 3.65 MIL/uL — ABNORMAL LOW (ref 3.87–5.11)
RDW: 16.5 % — ABNORMAL HIGH (ref 11.5–15.5)
WBC: 6.8 10*3/uL (ref 4.0–10.5)
nRBC: 0 % (ref 0.0–0.2)

## 2022-05-22 MED ORDER — ACETAMINOPHEN 500 MG PO TABS
1000.0000 mg | ORAL_TABLET | Freq: Three times a day (TID) | ORAL | Status: DC | PRN
  Administered 2022-05-22 – 2022-05-23 (×3): 1000 mg via ORAL
  Filled 2022-05-22 (×3): qty 2

## 2022-05-22 MED ORDER — DICLOFENAC SODIUM 1 % EX GEL
4.0000 g | Freq: Two times a day (BID) | CUTANEOUS | Status: DC | PRN
Start: 2022-05-22 — End: 2022-05-24
  Administered 2022-05-22 – 2022-05-24 (×4): 4 g via TOPICAL
  Filled 2022-05-22: qty 100

## 2022-05-22 MED ORDER — ACETAMINOPHEN 650 MG RE SUPP: 650.0000 mg | Freq: Four times a day (QID) | RECTAL | Status: DC | PRN

## 2022-05-22 MED ORDER — HYDROMORPHONE HCL 2 MG PO TABS
1.0000 mg | ORAL_TABLET | Freq: Once | ORAL | Status: AC
  Administered 2022-05-22: 1 mg via ORAL
  Filled 2022-05-22: qty 1

## 2022-05-22 NOTE — Progress Notes (Signed)
Hospital day#1 Subjective:     Overnight Events: had a fall last night, c/o right knee pain. R knee films negative for fracture, showing OA. Given tylenol prn.  Evaluated patient at bedside. She states that her left knee is very tender. She does not remember if she hurt her left knee yesterday but it is hurting a lot more than her right knee now. Asking for pain medication.  Objective:  Vital signs in last 24 hours: Vitals:   05/22/22 0207 05/22/22 0552 05/22/22 1000 05/22/22 1647  BP: 138/76 (!) 154/78 (!) 166/82 (!) 156/84  Pulse: 81 82 64 76  Resp: '19 18 18 18  '$ Temp: 98.6 F (37 C) 98.2 F (36.8 C) 97.6 F (36.4 C) 98.2 F (36.8 C)  TempSrc: Oral Oral Oral   SpO2: 100% 99% 94% 93%  Weight:      Height:       Supplemental O2: Room Air SpO2: 93 %   Physical Exam:  General: elderly female, sitting up in bed grabbing left knee. HENT: no dentures in place. Normal oral mucosa, no longer red in appearance.  CV: normal rate and regular rhythm. Pulm: CTABL, normal WOB. Abdomen: soft, nontender, nondistended, normoactive bowel sounds. MSK: Left knee tender to touch. No obvious injury noted. No increased warmth or redness noted. Unable to perform full exam due to pain. Right knee without tenderness today. Neuro: AAOx3. No asterixis on exam. Skin: severe painful erythema and open skin under bilateral breasts with serous drainage.  Filed Weights   05/20/22 1841  Weight: 91.6 kg     Intake/Output Summary (Last 24 hours) at 05/22/2022 1857 Last data filed at 05/22/2022 1700 Gross per 24 hour  Intake 820 ml  Output 575 ml  Net 245 ml   Net IO Since Admission: 2,586.1 mL [05/22/22 1857]  Pertinent Labs:    Latest Ref Rng & Units 05/22/2022    1:47 AM 05/21/2022   12:28 AM 05/20/2022    8:19 PM  CBC  WBC 4.0 - 10.5 K/uL 6.8  8.8  10.4   Hemoglobin 12.0 - 15.0 g/dL 10.6  11.5  12.3   Hematocrit 36.0 - 46.0 % 31.9  34.4  36.3   Platelets 150 - 400 K/uL 338  251  267         Latest Ref Rng & Units 05/22/2022    6:25 AM 05/21/2022    7:58 AM 05/21/2022   12:28 AM  CMP  Glucose 70 - 99 mg/dL 98   96   BUN 8 - 23 mg/dL 76   87   Creatinine 0.44 - 1.00 mg/dL 5.71   6.63   Sodium 135 - 145 mmol/L 135   134   Potassium 3.5 - 5.1 mmol/L 3.9   4.5   Chloride 98 - 111 mmol/L 103   102   CO2 22 - 32 mmol/L 18   17   Calcium 8.9 - 10.3 mg/dL 7.9  7.9  7.9     Imaging: DG Knee 3 Views Left  Result Date: 05/22/2022 CLINICAL DATA:  Fall today. Bilateral knee pain. EXAM: LEFT KNEE - 3 VIEW COMPARISON:  None Available. FINDINGS: Degenerative changes are present in the medial and patellofemoral compartments. Findings are similar to the right knee. No effusion or acute fracture is present. Vascular calcifications are present. IMPRESSION: 1. No acute abnormality. 2. Degenerative changes of the knee. 3. Atherosclerosis. Electronically Signed   By: San Morelle M.D.   On: 05/22/2022 12:00  DG KNEE 3 VIEW RIGHT  Result Date: 05/22/2022 CLINICAL DATA:  Knee pain status post fall. EXAM: RIGHT KNEE - 3 VIEW COMPARISON:  None Available. FINDINGS: The bones appear mildly demineralized but there is no evidence of acute fracture or dislocation. Tricompartmental degenerative changes most advanced in the medial and patellofemoral compartments. There is no significant knee joint effusion. Diffuse vascular calcifications are noted. IMPRESSION: No evidence of acute fracture or dislocation. 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 Electronically Signed   By: Richardean Sale M.D.   On: 05/22/2022 08:22    Assessment/Plan:   Principal Problem:   Acute renal failure (ARF) (H[facility]) Active Problems:   Primary hypertension   Stage 4 chronic kidney disease ([place])   Intertrigo   Metabolic acidosis   Patient Summary: Alicia [name] is a 71 YO female with a PMHx of CKD4 (Baseline Cr 2.8-3), PAD, HTN, HLD, CHF, DMT2 who presented to [facility] with weakness, dizziness,  and bilateral under breast skin infection admitted for AKI   #AKI #CKD 4 #Oliguria Kidney function gradually improving. AKI likely in the setting of dehydration and poor PO intake. Other things to consider are advancement of renal disease given CKD4 and renal U/S findings.  - Baseline Cr 2.3-3.3 - Nephrology consulted, appreciate recommendations --encouraging oral fluids --Continue to hold Lasix and spironolactone --Monitor Cr daily --Straight I/O --Renally dose medications for GFR and avoid nephrotoxic drugs --Use synthetic opiods if needed --No hemodialysis at this time --Maintain MAP >65 for renal perfusion  #Metabolic acidosis - Admission GAP 17, most recent 14 after IVF administration likely in the setting of AKI. - continue sodium bicarbonate - trend renal function panel   Fall Knee Pain No trauma to head or LOC with fall. Right knee films negative. Left knee films negative. Showing OA bilaterally.  -voltaren gel -tylenol 1g TID prn  #HTN Slightly elevated BP today, consider increasing antihypertensive therapy if remains hypertensive tomorrow. - Continue home metoprolol and amlodipine  #Skin wound #Cellulitis  Localized pain. No systemic signs of infections Severe, painful erythema and open skin under bilateral breast with serous fluid. Previously on Keflex but discontinued due to AKI. -  Started on doxyciline 100 mg BID - Continue clotrimazole cream twice a day - Add barrier cream and pads for comfort - wound care consult  #HFpEF Last echo 02/2022 with EF 55-60% and grade II diastolic dysfunction [id number] CXR on admission - no evidence of pulmonary edema or pleural effusions - Hold lasix ('40mg'$  daily) and Spironolactone (10 mg daily) given AKI - Strict in/outs and weight  #T2DM Last A1C: 7.8% on 03/2022 On Januvia, continue -CBG monitoring now q4hr  #Deconditioning Recently discharged from SNF on 6/24 with limited ability for ADLs. - PT/OT  consult  #Red-stained oral mucosa - resolved Likely related to something that she ate. Will continue to monitor for reappearance.  Diet:  regular  VTE: Heparin Code: Full PT/OT recs: Pending   Dispo: Anticipated discharge pending improvement in kidney function  Virl Axe, MD Internal Medicine Resident PGY-3 Please contact the on call pager after 5 pm and on weekends at [phone].

## 2022-05-22 NOTE — Progress Notes (Signed)
Nephrology Follow-Up Consult note   Assessment/Recommendations: Alicia [name] is a/an 71 y.o. female with a past medical history significant for CKD 4, PAD, HTN, HLD, CHF, DM 2 who present w/ weakness and AKI    AKI on CKD 4: unclear amount of UOP. Likely secondary to dehydration.  Creatinine continues to improve -No IV fluids today.  Encourage p.o. hydration -Agree with holding lasix and spiro -If she has uremic symptoms they are likely minimal -Continue to monitor daily Cr, Dose meds for GFR -Monitor Daily I/Os, Daily weight  -Maintain MAP>65 for optimal renal perfusion.  -Avoid nephrotoxic medications including NSAIDs -Use synthetic opioids (Fentanyl/Dilaudid) if needed -Currently no indication for HD -Need f/u w/ me at DC   Metabolic acidosis: Likely associated with AKI.  Continue sodium bicarbonate 1300 mg twice daily   Hypertension: Continue home amlodipine and metoprolol.  Holding Lasix and spironolactone   Cellulitis: on doxy. Mgmt per priamry   HFpEF: appears volume depleted. Hold lasix   Uncontrolled Diabetes Mellitus Type 2 with Hyperglycemia: mgmt per primary   Recommendations conveyed to primary service.    [facility] 05/22/2022 8:26 AM  ___________________________________________________________  CC: fatigue  Interval History/Subjective: Patient states she fell this morning and hit her knee.  Having some pain in her left knee.  Did not hit her head.  Denies any other complaints today.  Has a hard time drinking and eating.   Medications:  Current Facility-Administered Medications  Medication Dose Route Frequency Provider Last Rate Last Admin   acetaminophen (TYLENOL) tablet 650 mg  650 mg Oral Q6H PRN Atway, Rayann N, DO   650 mg at 05/22/22 0549   Or   acetaminophen (TYLENOL) suppository 650 mg  650 mg Rectal Q6H PRN Atway, Rayann N, DO       albuterol (PROVENTIL) (2.5 MG/3ML) 0.083% nebulizer solution 2.5 mg  2.5 mg  Nebulization Q6H PRN Lottie Mussel, MD       amLODipine (NORVASC) tablet 10 mg  10 mg Oral Daily Atway, Rayann N, DO   10 mg at 05/21/22 1118   aspirin EC tablet 81 mg  81 mg Oral QPM Atway, Rayann N, DO   81 mg at 05/21/22 1755   clotrimazole (LOTRIMIN) 1 % cream   Topical BID Atway, Rayann N, DO   Given at 05/21/22 2217   doxycycline (VIBRA-TABS) tablet 100 mg  100 mg Oral Q12H Atway, Rayann N, DO   100 mg at 05/21/22 2216   Gerhardt's butt cream   Topical BID Lottie Mussel, MD   Given at 05/21/22 2217   heparin injection 5,000 Units  5,000 Units Subcutaneous Q8H Atway, Rayann N, DO   5,000 Units at 05/22/22 0552   linagliptin (TRADJENTA) tablet 5 mg  5 mg Oral Daily Atway, Rayann N, DO   5 mg at 05/21/22 1120   metoprolol succinate (TOPROL-XL) 24 hr tablet 100 mg  100 mg Oral Daily Atway, Rayann N, DO   100 mg at 05/21/22 1117   ondansetron (ZOFRAN) tablet 4 mg  4 mg Oral Q8H PRN Iona Beard, MD       polyethylene glycol (MIRALAX / GLYCOLAX) packet 17 g  17 g Oral Daily Virl Axe, MD       rosuvastatin (CRESTOR) tablet 10 mg  10 mg Oral QPM Atway, Rayann N, DO   10 mg at 05/21/22 1755   senna-docusate (Senokot-S) tablet 2 tablet  2 tablet Oral QHS PRN Virl Axe, MD  sodium bicarbonate tablet 1,300 mg  1,300 mg Oral BID Reesa Chew, MD   1,300 mg at 05/21/22 2216      Review of Systems: 10 systems reviewed and negative except per interval history/subjective  Physical Exam: Vitals:   05/22/22 0207 05/22/22 0552  BP: 138/76 (!) 154/78  Pulse: 81 82  Resp: 19 18  Temp: 98.6 F (37 C) 98.2 F (36.8 C)  SpO2: 100% 99%   No intake/output data recorded.  Intake/Output Summary (Last 24 hours) at 05/22/2022 5498 Last data filed at 05/22/2022 0600 Gross per 24 hour  Intake 1351.1 ml  Output 0 ml  Net 1351.1 ml   Constitutional: well-appearing, no acute distress ENMT: ears and nose without scars or lesions, MMM CV: normal rate, no edema Respiratory: Bilateral  chest rise, normal work of breathing Gastrointestinal: soft, non-tender, no palpable masses or hernias Skin: no visible lesions or rashes Psych: alert, judgement/insight appropriate, appropriate mood and affect   Test Results I personally reviewed new and old clinical labs and radiology tests Lab Results  Component Value Date   NA 135 05/22/2022   K 3.9 05/22/2022   CL 103 05/22/2022   CO2 18 (L) 05/22/2022   BUN 76 (H) 05/22/2022   CREATININE 5.71 (H) 05/22/2022   CALCIUM 7.9 (L) 05/22/2022   ALBUMIN 2.4 (L) 05/22/2022   PHOS 3.5 05/22/2022    CBC Recent Labs  Lab 05/20/22 2019 05/21/22 0028 05/22/22 0147  WBC 10.4 8.8 6.8  NEUTROABS  --   --  5.1  HGB 12.3 11.5* 10.6*  HCT 36.3 34.4* 31.9*  MCV 87.3 87.1 87.4  PLT 267 251 338

## 2022-05-22 NOTE — Progress Notes (Signed)
Clinical Update:  Paged by RN that patient fell while attempting to ambulate to bedside commode. Patient was found down sitting on the ground and complaining about right knee pain. After being paged, I evaluated the patient at bedside. She stated that she was trying to get to the beside commode and fell on to her right knee. She stated that she braced her fall with her left arm. Patient denies any loss of consciousness or head impact. She is now complaining of right knee pain and left upper arm pain. She states that her pain is a 3-4/10 and describes it as an achy pain.  Physical exam: General: Wall appearing female in no acute distress and comfortable in bed.  MSK: Normal ROM of bilateral lower and upper extremities. Left elbow without any obvious deformities or tenderness to palpation. Right anterior knee with tenderness to palpation. Skin: Right knee with minor abrasion noted  Cardio: Regular Rate and rhythm with no murmurs, rubs, or gallops.  Assessment/Plan: Given that the patient is resting comfortably, I have low suspicion of any fracture. However,to confirm, I will obtain X ray of her right knee. I also advised the patient to use the call bell anytime she needs to get up so that she is able to ambulate without the risk of falling. Will give PRN tylenol for her pain.   Leigh Aurora, DO PGY-1 Internal Medicine Resident On-Call Pager [phone] 05/22/22 0230

## 2022-05-22 NOTE — Progress Notes (Signed)
   05/22/22 0328  What Happened  Was fall witnessed? No  Was patient injured? Yes  Patient found on floor;other (Comment) (on bottom)  Found by Staff-comment Tressa Busman RN)  Stated prior activity other (comment) (was told pt does not try to move on her own and that she is a one assist)  Follow Up  MD notified Dr. Leigh Aurora  Time MD notified [phone]  Family notified No - patient refusal  Time family notified  (pt refused and is alert and oriented x 4)  Additional tests Yes-comment (X-Ray of Right knee)  Simple treatment Other (comment) (Abraision and pt states it is just a little achy)  Progress note created (see row info) Yes  Adult Fall Risk Assessment  Risk Factor Category (scoring not indicated) History of more than one fall within 6 months before admission (document High fall risk);High fall risk per protocol (document High fall risk);Fall has occurred during this admission (document High fall risk)  Patient Fall Risk Level High fall risk  Adult Fall Risk Interventions  Required Bundle Interventions *See Row Information* High fall risk - low, moderate, and high requirements implemented  Additional Interventions Use of appropriate toileting equipment (bedpan, BSC, etc.);PT/OT need assessed if change in mobility from baseline  Screening for Fall Injury Risk (To be completed on HIGH fall risk patients) - Assessing Need for Floor Mats  Risk For Fall Injury- Criteria for Floor Mats Noncompliant with safety precautions;Previous fall this admission  Pain Assessment  Pain Scale 0-10  Pain Score 4  Pain Type Acute pain  Pain Location Knee  Pain Orientation Right  Pain Descriptors / Indicators Aching  Pain Frequency Intermittent  Pain Onset Other (Comment)  Patients Stated Pain Goal 0  Pain Intervention(s) MD notified (Comment) (MD ordered x-ray and stated Tylenol can be given an hour earlier if patient wants it, due to patient already having some.)  Multiple Pain Sites No   PCA/Epidural/Spinal Assessment  Respiratory Pattern Regular;Unlabored  Neurological  Neuro (WDL) WDL  Level of Consciousness Alert  Musculoskeletal  Musculoskeletal (WDL) X  Assistive Device BSC  Generalized Weakness Yes  Weight Bearing Restrictions No  Integumentary  Integumentary (WDL) X  Skin Integrity Abrasion  Abrasion Location Knee  Abrasion Location Orientation Right  Pain Assessment  Date Pain First Started 05/22/22  Result of Injury Yes  Pain Assessment  Work-Related Injury No

## 2022-05-23 ENCOUNTER — Inpatient Hospital Stay (HOSPITAL_COMMUNITY): Payer: Medicare HMO

## 2022-05-23 DIAGNOSIS — N184 Chronic kidney disease, stage 4 (severe): Secondary | ICD-10-CM | POA: Diagnosis not present

## 2022-05-23 DIAGNOSIS — I130 Hypertensive heart and chronic kidney disease with heart failure and stage 1 through stage 4 chronic kidney disease, or unspecified chronic kidney disease: Secondary | ICD-10-CM | POA: Diagnosis not present

## 2022-05-23 DIAGNOSIS — I739 Peripheral vascular disease, unspecified: Secondary | ICD-10-CM | POA: Diagnosis not present

## 2022-05-23 DIAGNOSIS — I5030 Unspecified diastolic (congestive) heart failure: Secondary | ICD-10-CM

## 2022-05-23 DIAGNOSIS — N179 Acute kidney failure, unspecified: Secondary | ICD-10-CM | POA: Diagnosis not present

## 2022-05-23 LAB — RENAL FUNCTION PANEL
Albumin: 2.2 g/dL — ABNORMAL LOW (ref 3.5–5.0)
Anion gap: 13 (ref 5–15)
BUN: 69 mg/dL — ABNORMAL HIGH (ref 8–23)
CO2: 20 mmol/L — ABNORMAL LOW (ref 22–32)
Calcium: 8.1 mg/dL — ABNORMAL LOW (ref 8.9–10.3)
Chloride: 104 mmol/L (ref 98–111)
Creatinine, Ser: 5.1 mg/dL — ABNORMAL HIGH (ref 0.44–1.00)
GFR, Estimated: 9 mL/min — ABNORMAL LOW (ref >60)
Glucose, Bld: 132 mg/dL — ABNORMAL HIGH (ref 70–99)
Phosphorus: 3.6 mg/dL (ref 2.5–4.6)
Potassium: 3.6 mmol/L (ref 3.5–5.1)
Sodium: 137 mmol/L (ref 135–145)

## 2022-05-23 LAB — UREA NITROGEN, URINE: Urea Nitrogen, Ur: 581 mg/dL

## 2022-05-23 LAB — GLUCOSE, CAPILLARY
Glucose-Capillary: 119 mg/dL — ABNORMAL HIGH (ref 70–99)
Glucose-Capillary: 107 mg/dL — ABNORMAL HIGH (ref 70–99)
Glucose-Capillary: 162 mg/dL — ABNORMAL HIGH (ref 70–99)
Glucose-Capillary: 130 mg/dL — ABNORMAL HIGH (ref 70–99)

## 2022-05-23 MED ORDER — ACETAMINOPHEN 500 MG PO TABS
1000.0000 mg | ORAL_TABLET | Freq: Three times a day (TID) | ORAL | Status: DC
  Administered 2022-05-23 – 2022-05-24 (×3): 1000 mg via ORAL
  Filled 2022-05-23 (×4): qty 2

## 2022-05-23 MED ORDER — SENNOSIDES-DOCUSATE SODIUM 8.6-50 MG PO TABS
2.0000 | ORAL_TABLET | Freq: Every day | ORAL | Status: DC
  Administered 2022-05-23 – 2022-05-24 (×2): 2 via ORAL
  Filled 2022-05-23 (×2): qty 2

## 2022-05-23 MED ORDER — ACETAMINOPHEN 650 MG RE SUPP
650.0000 mg | Freq: Three times a day (TID) | RECTAL | Status: DC
  Filled 2022-05-23: qty 1

## 2022-05-23 NOTE — Progress Notes (Addendum)
Hospital day#2 Subjective:   Overnight Events: none  Feeling better from fall. Tenderness has gone down. Feels better overall. Wound care came this morning and changed dressings. Tolerating PO liquids, still does not endorse appetite. BM 2 days ago.  Objective:  Vital signs in last 24 hours: Vitals:   05/22/22 1647 05/22/22 2132 05/23/22 0452 05/23/22 0950  BP: (!) 156/84 (!) 158/82 (!) 150/82 (!) 161/72  Pulse: 76 73 82 74  Resp: '18 18 18 19  '$ Temp: 98.2 F (36.8 C) 98.4 F (36.9 C) 98.7 F (37.1 C) 98.4 F (36.9 C)  TempSrc:  Oral Oral   SpO2: 93% 98% 98% 100%  Weight:   96.5 kg   Height:       Supplemental O2: room air SpO2: 100 %   Physical Exam:  Constitutional: well-appearing patient, laying comfortably in bed, in mild distress secondary to weakness.  HENT: mucous membranes moist Cardiovascular: regular rate and rhythm, no m/r/g Pulmonary/Chest: normal work of breathing on room air, lungs clear to auscultation bilaterally Abdominal: soft, non-tender, non-distended MSK: tenderness over L>R knee Neurological: alert & oriented x 3, Skin: warm and dry overall, significant for erythematous, well demarcated wound, oozing clear serous fluid under bilateral breasts L>R, improving from prior. Scar tissue now visible on the periphery. Barrier cream and cloth in place Psych: normal mood and affect  Filed Weights   05/20/22 1841 05/23/22 0452  Weight: 91.6 kg 96.5 kg     Intake/Output Summary (Last 24 hours) at 05/23/2022 1448 Last data filed at 05/23/2022 1300 Gross per 24 hour  Intake 960 ml  Output 575 ml  Net 385 ml   Net IO Since Admission: 3,056.1 mL [05/23/22 1448]  Pertinent Labs:    Latest Ref Rng & Units 05/22/2022    1:47 AM 05/21/2022   12:28 AM 05/20/2022    8:19 PM  CBC  WBC 4.0 - 10.5 K/uL 6.8  8.8  10.4   Hemoglobin 12.0 - 15.0 g/dL 10.6  11.5  12.3   Hematocrit 36.0 - 46.0 % 31.9  34.4  36.3   Platelets 150 - 400 K/uL 338  251  267         Latest Ref Rng & Units 05/23/2022    6:14 AM 05/22/2022    6:25 AM 05/21/2022    7:58 AM  CMP  Glucose 70 - 99 mg/dL 132  98    BUN 8 - 23 mg/dL 69  76    Creatinine 0.44 - 1.00 mg/dL 5.10  5.71    Sodium 135 - 145 mmol/L 137  135    Potassium 3.5 - 5.1 mmol/L 3.6  3.9    Chloride 98 - 111 mmol/L 104  103    CO2 22 - 32 mmol/L 20  18    Calcium 8.9 - 10.3 mg/dL 8.1  7.9  7.9     Imaging: VAS [place] ABI WITH/WO TBI  Result Date: 05/23/2022  LOWER EXTREMITY DOPPLER STUDY [name]  Date of Exam:   05/23/2022 Medical Rec #: [id number]             Accession #:    [id number] Date of Birth: 03/25/1951             Patient Gender: F Patient Age:   71 years Exam Location:  [facility] Procedure:      VAS [place] ABI WITH/WO TBI Referring Phys: JULIE MACHEN --------------------------------------------------------------------------------  Indications: Peripheral artery disease. High Risk Factors:  Hypertension, Diabetes.  Comparison Study: 02/01/2019 - Right: Resting right ankle-brachial index                   indicates moderate right lower                   extremity arterial disease. The right toe-brachial index is                   abnormal.                    Left: Resting left ankle-brachial index is within normal                   range. No                   evidence of significant left lower extremity arterial disease.                   The left                   toe-brachial index is normal. Performing Technologist: Carlos Levering RVT  Examination Guidelines: A complete evaluation includes at minimum, Doppler waveform signals and systolic blood pressure reading at the level of bilateral brachial, anterior tibial, and posterior tibial arteries, when vessel segments are accessible. Bilateral testing is considered an integral part of a complete examination. Photoelectric Plethysmograph (PPG) waveforms and toe systolic pressure readings are included as required and additional duplex testing as  needed. Limited examinations for reoccurring indications may be performed as noted.  ABI Findings: +---------+------------------+-----+----------+--------+ Right    Rt Pressure (mmHg)IndexWaveform  Comment  +---------+------------------+-----+----------+--------+ Brachial 142                    triphasic          +---------+------------------+-----+----------+--------+ PTA      87                0.58 monophasic         +---------+------------------+-----+----------+--------+ DP       88                0.59 monophasic         +---------+------------------+-----+----------+--------+ Great Toe48                0.32                    +---------+------------------+-----+----------+--------+ +---------+------------------+-----+---------+-------+ Left     Lt Pressure (mmHg)IndexWaveform Comment +---------+------------------+-----+---------+-------+ Brachial 149                    triphasic        +---------+------------------+-----+---------+-------+ PTA                             absent           +---------+------------------+-----+---------+-------+ DP       86                0.58                  +---------+------------------+-----+---------+-------+ Great Toe50                0.34                  +---------+------------------+-----+---------+-------+ +-------+-----------+-----------+------------+------------+ ABI/TBIToday's ABIToday's TBIPrevious ABIPrevious TBI +-------+-----------+-----------+------------+------------+ Right  0.59  0.32                                +-------+-----------+-----------+------------+------------+ Left   0.58       0.34                                +-------+-----------+-----------+------------+------------+  Summary: Right: Resting right ankle-brachial index indicates moderate right lower extremity arterial disease. The right toe-brachial index is abnormal. Left: Resting left ankle-brachial index  indicates moderate left lower extremity arterial disease. The left toe-brachial index is abnormal. *See table(s) above for measurements and observations.     Preliminary     Assessment/Plan:   Principal Problem:   Acute renal failure (ARF) (HCC) Active Problems:   Primary hypertension   Stage 4 chronic kidney disease ([place])   Intertrigo   Metabolic acidosis   Patient Summary: Alicia [name] is a 71 y.o. with a pertinent PMH of CKD (Baseline Cr. 2.8-3), PAD, HTN, HLD, CHF, T2DM, who presented with dizziness, weakness, and bilateral underskin infection admitted for AKI  AKI CKD4 Oliguria Baseline Cr 2.3-3 Nephrology following: AKI likely in setting of dehydration and poor PO intake vs advancing kidney disease. No indications for more IVF fluids at this time. Nephrology following  Cr and GFR improvement from prior (5.1, 9, respectively) --encouraging oral fluids --Continue to hold Lasix and spironolactone --Monitor Cr daily --Strict I/O --Renally dose medications for GFR and avoid nephrotoxic drugs --Use synthetic opioids if needed --No hemodialysis at this time --Maintain MAP >65 for renal perfusion - Nephrology scheduled follow up with Dr. Zenda Alpers in 3 weeks if discharged in the next 24-48hrs given Cr downtrends. -- will plan to discharge her home without diuretics.   Metabolic Acidosis GAP 13 today, improving from admission in setting of AKI - continue NaBicarb '1300mg'$  BID  Knee pain Fall No trauma noted on XR, osteoarthritis noted bilaterally - Voltaren gel - '1000mg'$  tylenol TID   Wound infection Cellulitis Erythema intertrigo Wound care nurse consulted and following patient.  - Start interdry silver-impregnated fabric x 5 days starting 7/3 - Gerhardts barrier cream - continue doxycycline 100 mg BID - Discontinue clotrimazole cream per WOC, appreciate recs   HPpEF  55-60%. BNP 124 on admission. CXR - no pulm edema on admission - Holding diuretics - Strict  I/Os and weight  HTN Systolic in the 195K Continue home medications - amlodipine 10 mg daily - Metopolol 100 mg daily  T2DM 03/2022 A1c: 7.8% Continue on Januvia - Monitor CBGs  Constipation No bowel movements in 2 days. Patient endorsed discomfort. Has received Miralax, but has not received PRN Senna.  - Continue miralax packets - Senna-S scheduled, 2 tablets at bedtime.  Deconditioning Fall risk  PT/OT, consulted - recommends Home Health PT, OT, fall precautions  Diet:  regular  VTE: Heparin Code: Full PT/OT recs: Home Health PT, OT   Dispo: Anticipated discharge to Home in 2 days pending clinical improvement.   Romana Juniper, MD Internal Medicine Resident PGY-1 Please contact the on call pager after 5 pm and on weekends at [phone].

## 2022-05-23 NOTE — Consult Note (Addendum)
[facility] Nurse Consult Note: Reason for Consult: Consult requested for breast and abd skin folds and buttocks.  Performed remotely after review of progress notes in the EMR.  Pt's skin to the areas listed above is red, moist and macerated.  Appearance is consistent with moisture associated skin damage and intertrigo.  ICD-10 CM Codes for Irritant Dermatitis L24A2 - Due to fecal, urinary or dual incontinence L24A9 - Due to friction or contact with other specified body fluids L30.4  - Erythema intertrigo: dermatitis due to sweating and friction, friction dermatitis, friction eczema, and genital/thigh intertrigo.   Topical treatment instructions orders provided for bedside nurses to perform as follows:  Interdry silver-impregnated fabric ordered for use by bedside nurses and instructions provided. This product should remain in place for 5 days for optimal plan of care to provide antimicrobial benefits and wick moisture away from skin.    Pt already has Gerhardts butt cream ordered to protect skin and assist with repelling moisture. Please re-consult if further assistance is needed.  Thank-you,  Julien Girt MSN, [place], [place], [place], [place]

## 2022-05-23 NOTE — Progress Notes (Signed)
Occupational Therapy Treatment Patient Details Name: Alicia [name] MRN: [id number] DOB: Nov 09, 1951 Today's Date: 05/23/2022   History of present illness 71 y/o female presented to ED on 05/20/22 for generalized weakness and bilateral breast pain from skin infection after being discharged from rehab on 6/24. Admitted for acute renal failure and and cellulitis of breast and pannus. PMH: CHF, HTN, PVD, chronic respiratory failure on 2L supplemental oxygen, hx of CVA, T2DM, CKD4.   OT comments  Patient received in supine and declined getting out of bed since fall yesterday but agreed to get to EOB. Patient able to get to EOB without assistance and stated she felt nauseous and asked for emesis bag but did not vomit. Patient performed light grooming and UE strengthening with yellow therapy band while seated on EOB before returning to supine. Acute OT to continue to follow.    Recommendations for follow up therapy are one component of a multi-disciplinary discharge planning process, led by the attending physician.  Recommendations may be updated based on patient status, additional functional criteria and insurance authorization.    Follow Up Recommendations  Home health OT    Assistance Recommended at Discharge Intermittent Supervision/Assistance  Patient can return home with the following  A little help with bathing/dressing/bathroom;Help with stairs or ramp for entrance;Assist for transportation;Assistance with cooking/housework;A lot of help with walking and/or transfers   Equipment Recommendations  None recommended by OT    Recommendations for Other Services      Precautions / Restrictions Precautions Precautions: Fall Restrictions Weight Bearing Restrictions: No       Mobility Bed Mobility Overal bed mobility: Needs Assistance Bed Mobility: Supine to Sit, Sit to Supine     Supine to sit: Supervision Sit to supine: Min assist   General bed mobility comments: increased time  and supervision to get to EOB and min assist to return to supine due to assistance needed with LLE    Transfers Overall transfer level: Needs assistance                 General transfer comment: declined transfer     Balance Overall balance assessment: Needs assistance Sitting-balance support: No upper extremity supported, Feet supported Sitting balance-Leahy Scale: Good Sitting balance - Comments: able to sit on EOB without assistance                                   ADL either performed or assessed with clinical judgement   ADL Overall ADL's : Needs assistance/impaired     Grooming: Wash/dry hands;Wash/dry face;Set up;Sitting Grooming Details (indicate cue type and reason): sitting on EOB                               General ADL Comments: performed light grooming seated on EOB    Extremity/Trunk Assessment              Vision       Perception     Praxis      Cognition Arousal/Alertness: Awake/alert Behavior During Therapy: WFL for tasks assessed/performed Overall Cognitive Status: Within Functional Limits for tasks assessed                                 General Comments: Patient fearful of mobility since fall yesterday  Exercises Exercises: General Upper Extremity General Exercises - Upper Extremity Shoulder ABduction: Strengthening, Both, 20 reps, Seated, Theraband Theraband Level (Shoulder Abduction): Level 1 (Yellow) Elbow Flexion: Strengthening, Both, 20 reps, Seated, Theraband Theraband Level (Elbow Flexion): Level 1 (Yellow) Elbow Extension: Strengthening, Both, 20 reps, Seated, Theraband Theraband Level (Elbow Extension): Level 1 (Yellow)    Shoulder Instructions       General Comments      Pertinent Vitals/ Pain       Pain Assessment Pain Assessment: Faces Faces Pain Scale: Hurts even more Pain Location: LLE knee Pain Descriptors / Indicators: Aching, Discomfort, Grimacing,  Guarding Pain Intervention(s): Monitored during session, Limited activity within patient's tolerance, Repositioned  Home Living                                          Prior Functioning/Environment              Frequency  Min 2X/week        Progress Toward Goals  OT Goals(current goals can now be found in the care plan section)  Progress towards OT goals: Progressing toward goals  Acute Rehab OT Goals Patient Stated Goal: feel better OT Goal Formulation: With patient Time For Goal Achievement: 06/03/22 Potential to Achieve Goals: Good ADL Goals Pt Will Perform Grooming: with set-up;standing Pt Will Perform Upper Body Dressing: with set-up;sitting Pt Will Perform Lower Body Dressing: with set-up;sit to/from stand Pt Will Transfer to Toilet: with modified independence;ambulating;regular height toilet Pt/caregiver will Perform Home Exercise Program: Increased strength;Both right and left upper extremity;With theraband;With Supervision  Plan Discharge plan remains appropriate    Co-evaluation                 AM-PAC OT "6 Clicks" Daily Activity     Outcome Measure   Help from another person eating meals?: None Help from another person taking care of personal grooming?: None Help from another person toileting, which includes using toliet, bedpan, or urinal?: A Little Help from another person bathing (including washing, rinsing, drying)?: A Little Help from another person to put on and taking off regular upper body clothing?: A Little Help from another person to put on and taking off regular lower body clothing?: A Lot 6 Click Score: 19    End of Session    OT Visit Diagnosis: Unsteadiness on feet (R26.81);Muscle weakness (generalized) (M62.81)   Activity Tolerance Patient limited by pain   Patient Left in bed;with call bell/phone within reach;Other (comment) (mat on floor)   Nurse Communication Mobility status        Time:  [phone] OT Time Calculation (min): 24 min  Charges: OT General Charges $OT Visit: 1 Visit OT Treatments $Self Care/Home Management : 8-22 mins $Therapeutic Exercise: 8-22 mins  Lodema Hong, OTA Acute Rehabilitation Services  Office [phone]   Trixie Dredge 05/23/2022, 10:25 AM

## 2022-05-23 NOTE — Progress Notes (Signed)
ABI's have been completed. Preliminary results can be found in CV Proc through chart review.   05/23/22 12:20 PM Alicia [name] RVT

## 2022-05-23 NOTE — Progress Notes (Signed)
Physical Therapy Treatment Patient Details Name: Alicia [name] MRN: [id number] DOB: 03-21-1951 Today's Date: 05/23/2022   History of Present Illness 71 y/o female presented to ED on 05/20/22 for generalized weakness and bilateral breast pain from skin infection after being discharged from rehab on 6/24. Admitted for acute renal failure and and cellulitis of breast and pannus. PMH: CHF, HTN, PVD, chronic respiratory failure on 2L supplemental oxygen, hx of CVA, T2DM, CKD4.    PT Comments    PT stopped by earlier when RN giving pain medication and applying Voltaren, pt asked PT to come back later. On PT return pt reports she is still experiencing 8/10 pain, with increased encouragement allows PT to put bed in chair position. While in chair position pt participates in LE and core strength training. Pt request PT put her back in supine despite encouragement to sit in chair position. Pt reports she will sit up for dinner. D/c plans remain appropriate at this time however pt will need to start moving again soon. PT will continue to follow acutely.      Recommendations for follow up therapy are one component of a multi-disciplinary discharge planning process, led by the attending physician.  Recommendations may be updated based on patient status, additional functional criteria and insurance authorization.  Follow Up Recommendations  Home health PT     Assistance Recommended at Discharge Intermittent Supervision/Assistance  Patient can return home with the following A little help with walking and/or transfers;A little help with bathing/dressing/bathroom;Assistance with cooking/housework;Assist for transportation;Help with stairs or ramp for entrance   Equipment Recommendations  None recommended by PT       Precautions / Restrictions Precautions Precautions: Fall Restrictions Weight Bearing Restrictions: No     Mobility  Bed Mobility Overal bed mobility: Needs Assistance Bed Mobility:  Supine to Sit     Supine to sit: Supervision     General bed mobility comments: supervision to pull to longsitting from HoB elevated 50 degrees x10, declined to come to EoB due to L knee pain    Transfers                   General transfer comment: declined transfer          Balance Overall balance assessment: Needs assistance Sitting-balance support: No upper extremity supported, Feet supported Sitting balance-Leahy Scale: Good                                      Cognition Arousal/Alertness: Awake/alert Behavior During Therapy: WFL for tasks assessed/performed Overall Cognitive Status: Within Functional Limits for tasks assessed                                          Exercises General Exercises - Lower Extremity Ankle Circles/Pumps: AROM, Both, 10 reps, Supine Quad Sets: AROM, Both, 10 reps, Supine Gluteal Sets: AROM, Both, 10 reps, Seated Long Arc Quad: AAROM, Left, 10 reps, AROM, Right Heel Slides: AAROM, Left, 5 reps, AROM, Right, 10 reps, Seated Hip ABduction/ADduction: AROM, Both, 10 reps, Seated Hip Flexion/Marching: AROM, Right, 10 reps, Seated    General Comments General comments (skin integrity, edema, etc.): VSS on RA      Pertinent Vitals/Pain Pain Assessment Pain Assessment: Faces Faces Pain Scale: Hurts even more Pain Location: LLE knee Pain Descriptors /  Indicators: Aching, Discomfort, Grimacing, Guarding Pain Intervention(s): Limited activity within patient's tolerance, Monitored during session, Premedicated before session, Repositioned     PT Goals (current goals can now be found in the care plan section) Acute Rehab PT Goals Patient Stated Goal: to go home and not rehab PT Goal Formulation: With patient Time For Goal Achievement: 06/04/22 Potential to Achieve Goals: Good Progress towards PT goals: Not progressing toward goals - comment (limited by L knee pain)    Frequency    Min  3X/week      PT Plan Current plan remains appropriate       AM-PAC PT "6 Clicks" Mobility   Outcome Measure  Help needed turning from your back to your side while in a flat bed without using bedrails?: A Little Help needed moving from lying on your back to sitting on the side of a flat bed without using bedrails?: A Little Help needed moving to and from a bed to a chair (including a wheelchair)?: A Little Help needed standing up from a chair using your arms (e.g., wheelchair or bedside chair)?: A Lot Help needed to walk in hospital room?: A Lot Help needed climbing 3-5 steps with a railing? : A Lot 6 Click Score: 15    End of Session   Activity Tolerance: Patient limited by pain Patient left: in bed;with call bell/phone within reach Nurse Communication: Mobility status PT Visit Diagnosis: Unsteadiness on feet (R26.81);Muscle weakness (generalized) (M62.81);Difficulty in walking, not elsewhere classified (R26.2)     Time: 0947-0962 PT Time Calculation (min) (ACUTE ONLY): 20 min  Charges:  $Therapeutic Exercise: 8-22 mins                     Alicia [name] B. Migdalia Dk PT, DPT Acute Rehabilitation Services Please use secure chat or  Call Office [phone]    [place] 05/23/2022, 4:29 PM

## 2022-05-23 NOTE — Plan of Care (Signed)
  Problem: Clinical Measurements: Goal: Ability to maintain clinical measurements within normal limits will improve Outcome: Progressing   Problem: Clinical Measurements: Goal: Diagnostic test results will improve Outcome: Progressing   Problem: Activity: Goal: Risk for activity intolerance will decrease Outcome: Progressing   Problem: Nutrition: Goal: Adequate nutrition will be maintained Outcome: Progressing   Problem: Coping: Goal: Level of anxiety will decrease Outcome: Progressing   Problem: Pain Managment: Goal: General experience of comfort will improve Outcome: Progressing   Problem: Safety: Goal: Ability to remain free from injury will improve Outcome: Progressing   Problem: Skin Integrity: Goal: Risk for impaired skin integrity will decrease Outcome: Progressing

## 2022-05-23 NOTE — Progress Notes (Signed)
Patient ID: Alicia [name], female   DOB: October 12, 1951, 71 y.o.   MRN: [id number] [facility] Progress Note   Assessment/ Plan:   1. Acute kidney Injury on chronic kidney disease stage IV: Nonoliguric overnight with continued improvement of renal function status post intravenous fluids earlier this admission.  She is suspected to have had prerenal acute kidney injury from volume contraction and her diuretics are currently on hold.  Will continue to monitor on oral intake without indication for additional intravenous fluids at this time.  I have scheduled follow-up with Dr. Joylene Grapes in 3 weeks anticipating ability to discharge her home within the next 24 to 48 hours if creatinine continues to trend down.  Agree to discharge home without diuretics (which should be restarted on follow-up based on volume assessment) 2.  History of diastolic heart failure: Diuretics currently on hold for acute kidney injury/prerenal azotemia and she appears clinically compensated with regards to her heart failure. 3.  Hypertension: Blood pressures marginally elevated, continue amlodipine and metoprolol while holding diuretics including spironolactone. 4.  Metabolic acidosis: With both gap and nongap components; the former is likely from acute kidney injury with the latter from chronic kidney disease.  Continue sodium bicarbonate.  Subjective:   Reports to be feeling well this morning, denies any chest pain or shortness of breath.   Objective:   BP (!) 150/82   Pulse 82   Temp 98.7 F (37.1 C) (Oral)   Resp 18   Ht '5\' 8"'$  (1.727 m)   Wt 96.5 kg   SpO2 98%   BMI 32.35 kg/m   Intake/Output Summary (Last 24 hours) at 05/23/2022 0948 Last data filed at 05/23/2022 0800 Gross per 24 hour  Intake 660 ml  Output 575 ml  Net 85 ml   Weight change:   Physical Exam: Gen: Comfortably resting flat in bed.  Engages in conversation CVS: Pulse regular rhythm, normal rate, S1 and S2 normal Resp: Clear to  auscultation, no rales/rhonchi Abd: Soft, obese, nontender, bowel sounds normal Ext: No lower extremity edema  Imaging: DG Knee 3 Views Left  Result Date: 05/22/2022 CLINICAL DATA:  Fall today. Bilateral knee pain. EXAM: LEFT KNEE - 3 VIEW COMPARISON:  None Available. FINDINGS: Degenerative changes are present in the medial and patellofemoral compartments. Findings are similar to the right knee. No effusion or acute fracture is present. Vascular calcifications are present. IMPRESSION: 1. No acute abnormality. 2. Degenerative changes of the knee. 3. Atherosclerosis. Electronically Signed   By: San Morelle M.D.   On: 05/22/2022 12:00   DG KNEE 3 VIEW RIGHT  Result Date: 05/22/2022 CLINICAL DATA:  Knee pain status post fall. EXAM: RIGHT KNEE - 3 VIEW COMPARISON:  None Available. FINDINGS: The bones appear mildly demineralized but there is no evidence of acute fracture or dislocation. Tricompartmental degenerative changes most advanced in the medial and patellofemoral compartments. There is no significant knee joint effusion. Diffuse vascular calcifications are noted. IMPRESSION: No evidence of acute fracture or dislocation. 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 Electronically Signed   By: Richardean Sale M.D.   On: 05/22/2022 08:22    Labs: BMET Recent Labs  Lab 05/19/22 1151 05/20/22 2019 05/21/22 0028 05/21/22 0758 05/22/22 0625 05/23/22 0614  NA 133* 134* 134*  --  135 137  K 5.1 4.9 4.5  --  3.9 3.6  CL 95* 98 102  --  103 104  CO2 16* 19* 17*  --  18* 20*  GLUCOSE 179* 119* 96  --  98 132*  BUN 84* 95* 87*  --  76* 69*  CREATININE 6.95* 7.23* 6.63*  --  5.71* 5.10*  CALCIUM 8.7 8.3* 7.9* 7.9* 7.9* 8.1*  PHOS  --   --   --  4.2 3.5 3.6   CBC Recent Labs  Lab 05/19/22 1151 05/20/22 2019 05/21/22 0028 05/22/22 0147  WBC 13.9* 10.4 8.8 6.8  NEUTROABS  --   --   --  5.1  HGB 13.8 12.3 11.5* 10.6*  HCT 40.6 36.3 34.4* 31.9*  MCV 86 87.3 87.1 87.4  PLT  296 267 251 338    Medications:     acetaminophen  1,000 mg Oral Q8H   Or   acetaminophen  650 mg Rectal Q8H   amLODipine  10 mg Oral Daily   aspirin EC  81 mg Oral QPM   doxycycline  100 mg Oral Q12H   Gerhardt's butt cream   Topical BID   heparin  5,000 Units Subcutaneous Q8H   linagliptin  5 mg Oral Daily   metoprolol succinate  100 mg Oral Daily   polyethylene glycol  17 g Oral Daily   rosuvastatin  10 mg Oral QPM   sodium bicarbonate  1,300 mg Oral BID    Elmarie Shiley, MD 05/23/2022, 9:48 AM

## 2022-05-24 DIAGNOSIS — N184 Chronic kidney disease, stage 4 (severe): Secondary | ICD-10-CM | POA: Diagnosis not present

## 2022-05-24 DIAGNOSIS — I5030 Unspecified diastolic (congestive) heart failure: Secondary | ICD-10-CM | POA: Diagnosis not present

## 2022-05-24 DIAGNOSIS — I130 Hypertensive heart and chronic kidney disease with heart failure and stage 1 through stage 4 chronic kidney disease, or unspecified chronic kidney disease: Secondary | ICD-10-CM | POA: Diagnosis not present

## 2022-05-24 DIAGNOSIS — N179 Acute kidney failure, unspecified: Secondary | ICD-10-CM | POA: Diagnosis not present

## 2022-05-24 LAB — RENAL FUNCTION PANEL
Albumin: 2.2 g/dL — ABNORMAL LOW (ref 3.5–5.0)
Anion gap: 10 (ref 5–15)
BUN: 64 mg/dL — ABNORMAL HIGH (ref 8–23)
CO2: 21 mmol/L — ABNORMAL LOW (ref 22–32)
Calcium: 7.9 mg/dL — ABNORMAL LOW (ref 8.9–10.3)
Chloride: 105 mmol/L (ref 98–111)
Creatinine, Ser: 4.65 mg/dL — ABNORMAL HIGH (ref 0.44–1.00)
GFR, Estimated: 10 mL/min — ABNORMAL LOW (ref >60)
Glucose, Bld: 99 mg/dL (ref 70–99)
Phosphorus: 3.2 mg/dL (ref 2.5–4.6)
Potassium: 3.9 mmol/L (ref 3.5–5.1)
Sodium: 136 mmol/L (ref 135–145)

## 2022-05-24 LAB — GLUCOSE, CAPILLARY
Glucose-Capillary: 103 mg/dL — ABNORMAL HIGH (ref 70–99)
Glucose-Capillary: 126 mg/dL — ABNORMAL HIGH (ref 70–99)
Glucose-Capillary: 109 mg/dL — ABNORMAL HIGH (ref 70–99)
Glucose-Capillary: 121 mg/dL — ABNORMAL HIGH (ref 70–99)

## 2022-05-24 MED ORDER — ACETAMINOPHEN 650 MG RE SUPP: 650.0000 mg | Freq: Four times a day (QID) | RECTAL | Status: DC

## 2022-05-24 MED ORDER — DICLOFENAC SODIUM 1 % EX GEL
4.0000 g | Freq: Four times a day (QID) | CUTANEOUS | Status: DC
Start: 2022-05-24 — End: 2022-05-25
  Administered 2022-05-24 – 2022-05-25 (×5): 4 g via TOPICAL
  Filled 2022-05-24: qty 100

## 2022-05-24 MED ORDER — TRAMADOL HCL 50 MG PO TABS
50.0000 mg | ORAL_TABLET | Freq: Once | ORAL | Status: AC
  Administered 2022-05-24: 50 mg via ORAL
  Filled 2022-05-24: qty 1

## 2022-05-24 MED ORDER — POLYETHYLENE GLYCOL 3350 17 G PO PACK
17.0000 g | PACK | Freq: Two times a day (BID) | ORAL | Status: DC
Start: 2022-05-24 — End: 2022-05-24

## 2022-05-24 MED ORDER — ACETAMINOPHEN 500 MG PO TABS
1000.0000 mg | ORAL_TABLET | Freq: Four times a day (QID) | ORAL | Status: DC
  Administered 2022-05-24 – 2022-05-25 (×4): 1000 mg via ORAL
  Filled 2022-05-24 (×5): qty 2

## 2022-05-24 NOTE — Plan of Care (Signed)

## 2022-05-24 NOTE — Progress Notes (Addendum)
Hospital day#3 Subjective:    Overnight Events: Endorsed L knee pain and received one dose of tramadol  AM: overall feeling better; however, still endorses tenderness and pain of the L knee after fall. Pain waves associated with nausea. Patient received one dose of PRN zofran this am.  Patient is able to move the leg without problem but is worried current dose of tylenol is not sufficient. No BM overnight. Has continued to urinate but still low output, unchanged from prior. Still tolerating fluids. Continues to participate in PT.  Objective:  Vital signs in last 24 hours: Vitals:   05/23/22 0452 05/23/22 0950 05/23/22 1743 05/23/22 2026  BP: (!) 150/82 (!) 161/72 136/62 (!) 152/73  Pulse: 82 74 71 70  Resp: '18 19 18 18  '$ Temp: 98.7 F (37.1 C) 98.4 F (36.9 C) 98.2 F (36.8 C) 98.7 F (37.1 C)  TempSrc: Oral     SpO2: 98% 100% 98% 98%  Weight: 96.5 kg     Height:       Supplemental O2: Room Air SpO2: 98 %   Physical Exam:  Constitutional: Tired-appearing woman laying in bed in mild distress secondary to L knee pain Cardiovascular: regular rate and rhythm, no m/r/g Pulmonary/Chest: normal work of breathing on room air, lungs clear to auscultation bilaterally Abdominal: soft, non-tender, non-distended. HAL:PFXT soft tissue edema and tenderness overlying the L knee. No gross deformities. No erythema or warmth. Neurological: alert & oriented  Skin: warm and dry except for bilateral under breast wounds: L>> R erythematous, well demarcated wound, now without serous fluid and scar formation on the periphery. Barrier cream over site.  Psych: Appropriate mood and affect  Filed Weights   05/20/22 1841 05/23/22 0452  Weight: 91.6 kg 96.5 kg     Intake/Output Summary (Last 24 hours) at 05/24/2022 0518 Last data filed at 05/24/2022 0000 Gross per 24 hour  Intake 900 ml  Output 500 ml  Net 400 ml   Net IO Since Admission: 2,856.1 mL [05/24/22 0518]  Pertinent Labs:     Latest Ref Rng & Units 05/22/2022    1:47 AM 05/21/2022   12:28 AM 05/20/2022    8:19 PM  CBC  WBC 4.0 - 10.5 K/uL 6.8  8.8  10.4   Hemoglobin 12.0 - 15.0 g/dL 10.6  11.5  12.3   Hematocrit 36.0 - 46.0 % 31.9  34.4  36.3   Platelets 150 - 400 K/uL 338  251  267        Latest Ref Rng & Units 05/24/2022    1:57 AM 05/23/2022    6:14 AM 05/22/2022    6:25 AM  CMP  Glucose 70 - 99 mg/dL 99  132  98   BUN 8 - 23 mg/dL 64  69  76   Creatinine 0.44 - 1.00 mg/dL 4.65  5.10  5.71   Sodium 135 - 145 mmol/L 136  137  135   Potassium 3.5 - 5.1 mmol/L 3.9  3.6  3.9   Chloride 98 - 111 mmol/L 105  104  103   CO2 22 - 32 mmol/L '21  20  18   '$ Calcium 8.9 - 10.3 mg/dL 7.9  8.1  7.9     Imaging: VAS [place] ABI WITH/WO TBI  Result Date: 05/23/2022  LOWER EXTREMITY DOPPLER STUDY Patient Name:  MIRISSA LOPRESTI  Date of Exam:   05/23/2022 Medical Rec #: [id number]             Accession #:  [id number] Date of Birth: [birth year]             Patient Gender: F Patient Age:   71 years Exam Location:  [facility] Procedure:      VAS [place] ABI WITH/WO TBI Referring Phys: JULIE MACHEN --------------------------------------------------------------------------------  Indications: Peripheral artery disease. High Risk Factors: Hypertension, Diabetes.  Comparison Study: 02/01/2019 - Right: Resting right ankle-brachial index                   indicates moderate right lower                   extremity arterial disease. The right toe-brachial index is                   abnormal.                    Left: Resting left ankle-brachial index is within normal                   range. No                   evidence of significant left lower extremity arterial disease.                   The left                   toe-brachial index is normal. Performing Technologist: Carlos Levering RVT  Examination Guidelines: A complete evaluation includes at minimum, Doppler waveform signals and systolic blood pressure reading at the level of bilateral  brachial, anterior tibial, and posterior tibial arteries, when vessel segments are accessible. Bilateral testing is considered an integral part of a complete examination. Photoelectric Plethysmograph (PPG) waveforms and toe systolic pressure readings are included as required and additional duplex testing as needed. Limited examinations for reoccurring indications may be performed as noted.  ABI Findings: +---------+------------------+-----+----------+--------+ Right    Rt Pressure (mmHg)IndexWaveform  Comment  +---------+------------------+-----+----------+--------+ Brachial 142                    triphasic          +---------+------------------+-----+----------+--------+ PTA      87                0.58 monophasic         +---------+------------------+-----+----------+--------+ DP       88                0.59 monophasic         +---------+------------------+-----+----------+--------+ Great Toe48                0.32                    +---------+------------------+-----+----------+--------+ +---------+------------------+-----+---------+-------+ Left     Lt Pressure (mmHg)IndexWaveform Comment +---------+------------------+-----+---------+-------+ Brachial 149                    triphasic        +---------+------------------+-----+---------+-------+ PTA                             absent           +---------+------------------+-----+---------+-------+ DP       86                0.58                  +---------+------------------+-----+---------+-------+  Great Toe50                0.34                  +---------+------------------+-----+---------+-------+ +-------+-----------+-----------+------------+------------+ ABI/TBIToday's ABIToday's TBIPrevious ABIPrevious TBI +-------+-----------+-----------+------------+------------+ Right  0.59       0.32                                +-------+-----------+-----------+------------+------------+ Left    0.58       0.34                                +-------+-----------+-----------+------------+------------+  Summary: Right: Resting right ankle-brachial index indicates moderate right lower extremity arterial disease. The right toe-brachial index is abnormal. Left: Resting left ankle-brachial index indicates moderate left lower extremity arterial disease. The left toe-brachial index is abnormal. *See table(s) above for measurements and observations.  Electronically signed by Deitra Mayo MD on 05/23/2022 at 4:24:29 PM.    Final     Assessment/Plan:   Principal Problem:   Acute renal failure (ARF) ([place]) Active Problems:   Primary hypertension   Stage 4 chronic kidney disease ([place])   Intertrigo   Metabolic acidosis   Patient Summary: CHENISE MULVIHILL is a 71 y.o. with a pertinent PMH of CKD (Baseline Cr.2.8-3), PAD, HTN, CHF, T2DM,  who presented with dizziness, weakness, and B/L under skin infection admitted for AKI, now resolving and being managed for the following hospital problems  AKI on CKD4 Oliguria Baseline Cr 2.3-3.2 Cr today 4.6 (7/4), from 5.1 (7/3), improving Today: BUN 64, GFR 10, improving, UOP improving No indication for IVF at this time. Nephrology following: AKI likely in setting of dehydration, poor PO intake and high dose home diuretics as evidenced by her improvement of BMP.  Nephrology continues to follow with the following recommendations - encourage PO intake --Continue to hold Lasix and spironolactone --Monitor BMP daily and I/Os --Avoid nephrotoxins and renally dose medications --No hemodialysis at this time --Goal MAP >65 - Discharge plan:  - Will monitor for the next 24 hrs and evaluate for discharge tomorrow   - Follow up in [facility] clinic within one week of discharge - Scheduled: f/u with Dr. Zenda Alpers in 3weeks if Cr improving, clinically stable, and able to be discharged in the next 24-48 hrs. - No diuretics at discharge pending volume  status  Metabolic Acidosis GAP 10 (7/4), improving from admission in setting of AKI Bicarb 21, improving - continue NaBicarb '1300mg'$  BID   Knee pain Fall No trauma or effusion noted on XR, osteoarthritis noted bilaterally. Working with PT to increase strength. Mild effusion on examination without warmth or erythema. - Voltaren gel 4 times/day - Continue tylenol 1000 mg 4 times/day    Wound infection Cellulitis Erythema intertrigo Wound care nurse consulted and following patient. Overall, improving.  - Continue interdry silver-impregnated fabric x 5 days starting 7/3 - Gerhardts barrier cream - Doxycycline 100 mg BID started on 6/30, day 5/7 of treatment   HPpEF  55-60%. BNP 124 on admission. Weight at last hospital discharge 120 kg, now 96.5 kg. CXR - no pulm edema on admission - continue holding diuretics - strict I/Os and weight   HTN Systolic Bps 270W today. Denies HA, CP, lighheadedness. - Continue amlodipine 10 mg daily - Continue Metoprolol 100 mg daily   T2DM 03/2022 A1c: 7.8% Continue on  Januvia - Monitor CBGs   Constipation Received Senna- S scheduled yesterday. No BM today. Will continue to monitor - Continue miralax packets - Continue Senna-S scheduled, 2 tablets at bedtime.   Deconditioning Fall risk  PT/OT, consulted - recommends Home Health PT, OT, fall precautions   Dispo: Anticipated discharge to Home in 1 days pending clinical improvement.   Romana Juniper, MD Internal Medicine Resident PGY-1 Please contact the on call pager after 5 pm and on weekends at [phone].

## 2022-05-24 NOTE — Progress Notes (Signed)
Patient ID: Alicia [name], female   DOB: [birth year], 71 y.o.   MRN: [id number] [facility] Progress Note   Assessment/ Plan:   1. Acute kidney Injury on chronic kidney disease stage IV (baseline creatinine 2.8-3.2): Nonoliguric with continued improvement of renal function seen on labs this morning.  She appears to have suffered hemodynamically mediated acute kidney injury in the setting of high-dose diuretics and these have currently been held.  I agree with holding diuretics (furosemide/spironolactone) at this time and restarting lower dose furosemide 20 mg daily at the time of discharge from the hospital to be reassessed for up titration or discontinuation at the time of follow-up.  She will be contacted by our office when open tomorrow with details of follow-up in 3 weeks with Dr. Joylene Grapes. 2.  History of diastolic heart failure: Diuretics currently on hold for acute kidney injury/prerenal azotemia and she appears clinically compensated with regards to her heart failure. 3.  Hypertension: Blood pressures marginally elevated, continue amlodipine and metoprolol while holding furosemide/spironolactone. 4.  Metabolic acidosis: With both gap and nongap components; the former is likely from acute kidney injury with the latter from chronic kidney disease.  Continue sodium bicarbonate.  Subjective:   Reports to be feeling well this morning, denies any chest pain or shortness of breath.   Objective:   BP 137/87   Pulse 76   Temp 98.1 F (36.7 C)   Resp 18   Ht '5\' 8"'$  (1.727 m)   Wt 96.5 kg   SpO2 98%   BMI 32.35 kg/m   Intake/Output Summary (Last 24 hours) at 05/24/2022 0916 Last data filed at 05/24/2022 0535 Gross per 24 hour  Intake 600 ml  Output 900 ml  Net -300 ml   Weight change:   Physical Exam: Gen: Resting comfortably in bed, watching television. CVS: Pulse regular rhythm, normal rate, S1 and S2 normal Resp: Clear to auscultation, no rales/rhonchi Abd: Soft,  obese, nontender, bowel sounds normal Ext: No lower extremity edema  Imaging: VAS [place] ABI WITH/WO TBI  Result Date: 05/23/2022  LOWER EXTREMITY DOPPLER STUDY [name]  Date of Exam:   05/23/2022 Medical Rec #: [id number]             Accession #:    [id number] Date of Birth: [birth year]             Patient Gender: F Patient Age:   42 years Exam Location[facility]  [facility] Procedure:      VAS [place] ABI WITH/WO TBI Referring Phys: JULIE MACHEN --------------------------------------------------------------------------------  Indications: Peripheral artery disease. High Risk Factors: Hypertension, Diabetes.  Comparison Study: 02/01/2019 - Right: Resting right ankle-brachial index                   indicates moderate right lower                   extremity arterial disease. The right toe-brachial index is                   abnormal.                    Left: Resting left ankle-brachial index is within normal                   range. No                   evidence of significant left lower extremity arterial  disease.                   The left                   toe-brachial index is normal. Performing Technologist: Carlos Levering RVT  Examination Guidelines: A complete evaluation includes at minimum, Doppler waveform signals and systolic blood pressure reading at the level of bilateral brachial, anterior tibial, and posterior tibial arteries, when vessel segments are accessible. Bilateral testing is considered an integral part of a complete examination. Photoelectric Plethysmograph (PPG) waveforms and toe systolic pressure readings are included as required and additional duplex testing as needed. Limited examinations for reoccurring indications may be performed as noted.  ABI Findings: +---------+------------------+-----+----------+--------+ Right    Rt Pressure (mmHg)IndexWaveform  Comment  +---------+------------------+-----+----------+--------+ Brachial 142                    triphasic           +---------+------------------+-----+----------+--------+ PTA      87                0.58 monophasic         +---------+------------------+-----+----------+--------+ DP       88                0.59 monophasic         +---------+------------------+-----+----------+--------+ Great Toe48                0.32                    +---------+------------------+-----+----------+--------+ +---------+------------------+-----+---------+-------+ Left     Lt Pressure (mmHg)IndexWaveform Comment +---------+------------------+-----+---------+-------+ Brachial 149                    triphasic        +---------+------------------+-----+---------+-------+ PTA                             absent           +---------+------------------+-----+---------+-------+ DP       86                0.58                  +---------+------------------+-----+---------+-------+ Great Toe50                0.34                  +---------+------------------+-----+---------+-------+ +-------+-----------+-----------+------------+------------+ ABI/TBIToday's ABIToday's TBIPrevious ABIPrevious TBI +-------+-----------+-----------+------------+------------+ Right  0.59       0.32                                +-------+-----------+-----------+------------+------------+ Left   0.58       0.34                                +-------+-----------+-----------+------------+------------+  Summary: Right: Resting right ankle-brachial index indicates moderate right lower extremity arterial disease. The right toe-brachial index is abnormal. Left: Resting left ankle-brachial index indicates moderate left lower extremity arterial disease. The left toe-brachial index is abnormal. *See table(s) above for measurements and observations.  Electronically signed by Deitra Mayo MD on 05/23/2022 at 4:24:29 PM.    Final    DG Knee 3 Views Left  Result Date: 05/22/2022  CLINICAL DATA:  Fall today.  Bilateral knee pain. EXAM: LEFT KNEE - 3 VIEW COMPARISON:  None Available. FINDINGS: Degenerative changes are present in the medial and patellofemoral compartments. Findings are similar to the right knee. No effusion or acute fracture is present. Vascular calcifications are present. IMPRESSION: 1. No acute abnormality. 2. Degenerative changes of the knee. 3. Atherosclerosis. Electronically Signed   By: San Morelle M.D.   On: 05/22/2022 12:00    Labs: BMET Recent Labs  Lab 05/19/22 1151 05/20/22 2019 05/21/22 0028 05/21/22 0758 05/22/22 0625 05/23/22 0614 05/24/22 0157  NA 133* 134* 134*  --  135 137 136  K 5.1 4.9 4.5  --  3.9 3.6 3.9  CL 95* 98 102  --  103 104 105  CO2 16* 19* 17*  --  18* 20* 21*  GLUCOSE 179* 119* 96  --  98 132* 99  BUN 84* 95* 87*  --  76* 69* 64*  CREATININE 6.95* 7.23* 6.63*  --  5.71* 5.10* 4.65*  CALCIUM 8.7 8.3* 7.9* 7.9* 7.9* 8.1* 7.9*  PHOS  --   --   --  4.2 3.5 3.6 3.2   CBC Recent Labs  Lab 05/19/22 1151 05/20/22 2019 05/21/22 0028 05/22/22 0147  WBC 13.9* 10.4 8.8 6.8  NEUTROABS  --   --   --  5.1  HGB 13.8 12.3 11.5* 10.6*  HCT 40.6 36.3 34.4* 31.9*  MCV 86 87.3 87.1 87.4  PLT 296 267 251 338    Medications:     acetaminophen  1,000 mg Oral Q8H   Or   acetaminophen  650 mg Rectal Q8H   amLODipine  10 mg Oral Daily   aspirin EC  81 mg Oral QPM   doxycycline  100 mg Oral Q12H   Gerhardt's butt cream   Topical BID   heparin  5,000 Units Subcutaneous Q8H   linagliptin  5 mg Oral Daily   metoprolol succinate  100 mg Oral Daily   polyethylene glycol  17 g Oral Daily   rosuvastatin  10 mg Oral QPM   senna-docusate  2 tablet Oral QHS   sodium bicarbonate  1,300 mg Oral BID    Elmarie Shiley, MD 05/24/2022, 9:16 AM

## 2022-05-25 ENCOUNTER — Other Ambulatory Visit (HOSPITAL_COMMUNITY): Payer: Self-pay

## 2022-05-25 DIAGNOSIS — I5030 Unspecified diastolic (congestive) heart failure: Secondary | ICD-10-CM | POA: Diagnosis not present

## 2022-05-25 DIAGNOSIS — I130 Hypertensive heart and chronic kidney disease with heart failure and stage 1 through stage 4 chronic kidney disease, or unspecified chronic kidney disease: Secondary | ICD-10-CM | POA: Diagnosis not present

## 2022-05-25 DIAGNOSIS — N179 Acute kidney failure, unspecified: Secondary | ICD-10-CM | POA: Diagnosis not present

## 2022-05-25 DIAGNOSIS — N184 Chronic kidney disease, stage 4 (severe): Secondary | ICD-10-CM | POA: Diagnosis not present

## 2022-05-25 LAB — RENAL FUNCTION PANEL
Albumin: 2.2 g/dL — ABNORMAL LOW (ref 3.5–5.0)
Anion gap: 11 (ref 5–15)
BUN: 66 mg/dL — ABNORMAL HIGH (ref 8–23)
CO2: 20 mmol/L — ABNORMAL LOW (ref 22–32)
Calcium: 7.7 mg/dL — ABNORMAL LOW (ref 8.9–10.3)
Chloride: 103 mmol/L (ref 98–111)
Creatinine, Ser: 4.55 mg/dL — ABNORMAL HIGH (ref 0.44–1.00)
GFR, Estimated: 10 mL/min — ABNORMAL LOW (ref >60)
Glucose, Bld: 107 mg/dL — ABNORMAL HIGH (ref 70–99)
Phosphorus: 3.8 mg/dL (ref 2.5–4.6)
Potassium: 4.1 mmol/L (ref 3.5–5.1)
Sodium: 134 mmol/L — ABNORMAL LOW (ref 135–145)

## 2022-05-25 LAB — GLUCOSE, CAPILLARY
Glucose-Capillary: 110 mg/dL — ABNORMAL HIGH (ref 70–99)
Glucose-Capillary: 143 mg/dL — ABNORMAL HIGH (ref 70–99)
Glucose-Capillary: 125 mg/dL — ABNORMAL HIGH (ref 70–99)

## 2022-05-25 MED ORDER — DICLOFENAC SODIUM 1 % EX GEL
4.0000 g | Freq: Four times a day (QID) | CUTANEOUS | 0 refills | Status: DC
  Filled 2022-05-25: qty 100, 4d supply, fill #0

## 2022-05-25 MED ORDER — ACETAMINOPHEN 500 MG PO TABS
1000.0000 mg | ORAL_TABLET | Freq: Four times a day (QID) | ORAL | 0 refills | Status: DC
  Filled 2022-05-25 (×2): qty 30, 4d supply, fill #0

## 2022-05-25 MED ORDER — DOXYCYCLINE HYCLATE 100 MG PO TABS
100.0000 mg | ORAL_TABLET | Freq: Two times a day (BID) | ORAL | 0 refills | Status: AC
  Filled 2022-05-25 (×2): qty 3, 2d supply, fill #0

## 2022-05-25 MED ORDER — SODIUM BICARBONATE 650 MG PO TABS
1300.0000 mg | ORAL_TABLET | Freq: Two times a day (BID) | ORAL | 0 refills | Status: AC
  Filled 2022-05-25: qty 120, 30d supply, fill #0
  Filled 2022-05-25: qty 60, 15d supply, fill #0

## 2022-05-25 MED ORDER — SENNOSIDES-DOCUSATE SODIUM 8.6-50 MG PO TABS
2.0000 | ORAL_TABLET | Freq: Every day | ORAL | 0 refills | Status: AC
  Filled 2022-05-25 (×2): qty 60, 30d supply, fill #0

## 2022-05-25 NOTE — TOC Initial Note (Signed)
Transition of Care [facility]) - Initial/Assessment Note    Patient Details  Name: Alicia [name] MRN: [id number] Date of Birth: 12/25/1950  Transition of Care [facility]) CM/SW Contact:    Milinda Antis, [place] Phone Number: 05/25/2022, 2:21 PM  Clinical Narrative:                 CSW received consult for possible SNF placement at time of discharge. CSW spoke with patient at bedside.  Patient is not agreeable to SNF placement at time of discharge. Patient reports that she just left [facility] after being at the facility for 2 months and refuses to go back.  CSW explained that the referral could be sent to facilities other than [facility] and the patient declined.  Patient reported that she wants to go home.  CSW inquired about natural supports that could assist the patient at home.  The patient reports that she has a neighbor that has a key who comes to check on patient periodically or anytime that the patient calls.  Patient reports that she does not have anyone who can stay 24/7.  Patient has a daughter who lives in [place].  CSW again reiterated PT's recommendations.  Patient was able to verbalize that she is a high fall risk and that while working with PT today she became dizzy and this could continue when the patient goes home.    Patient reports that she does not get up that often at home and wants to go home.  When asked about mobilizing to the bathroom, the patient reported that she has a bedside commode that she will put next to the bed.  CSW inquired about how the patient would get into her home as there are steps.  The patient said that she will "find a way".  Patient is adamant about returning home.  Patient is open to home health services.  Patient reports that she has a bedside commode, rolling walker, cane, and life alert at the home.  No further questions reported at this time.      Expected Discharge Plan: [place] Barriers to Discharge: Continued  Medical Work up   Patient Goals and CMS Choice Patient states their goals for this hospitalization and ongoing recovery are:: return home CMS Medicare.gov Compare Post Acute Care list provided to:: Patient Choice offered to / list presented to : Patient  Expected Discharge Plan and Services Expected Discharge Plan: [place]   Discharge Planning Services: CM Consult Post Acute Care Choice: [place] arrangements for the past 2 months: Apartment Expected Discharge Date: 05/25/22               DME Arranged: N/A DME Agency: NA       HH Arranged: PT, OT HH Agency: [place] [facility] Agency Contacted: 05/21/22 Time [place]: [id number] Representative spoke with at [place]: Adela Lank  Prior Living Arrangements/Services Living arrangements for the past 2 months: Apartment Lives with:: Self Patient language and need for interpreter reviewed:: Yes Do you feel safe going back to the place where you live?: Yes      Need for Family Participation in Patient Care: No (Comment)   Current home services: DME Criminal Activity/Legal Involvement Pertinent to Current Situation/Hospitalization: No - Comment as needed  Activities of Daily Living Home Assistive Devices/Equipment: Walker (specify type), Cane (specify quad or straight), Dentures (specify type), Blood pressure cuff, Eyeglasses, Scales, CBG Meter ADL Screening (condition  at time of admission) Patient's cognitive ability adequate to safely complete daily activities?: Yes Is the patient deaf or have difficulty hearing?: No Does the patient have difficulty seeing, even when wearing glasses/contacts?: No Does the patient have difficulty concentrating, remembering, or making decisions?: No Patient able to express need for assistance with ADLs?: Yes Does the patient have difficulty dressing or bathing?: Yes Independently performs ADLs?: Yes (appropriate for developmental age) Does the  patient have difficulty walking or climbing stairs?: Yes Weakness of Legs: Both Weakness of Arms/Hands: Both  Permission Sought/Granted                  Emotional Assessment Appearance:: Appears stated age Attitude/Demeanor/Rapport: Engaged Affect (typically observed): Accepting Orientation: : Oriented to Self, Oriented to Place, Oriented to  Time, Oriented to Situation Alcohol / Substance Use: Not Applicable Psych Involvement: No (comment)  Admission diagnosis:  Intertrigo [L30.4] Acute renal failure (ARF) (HCC) [N17.9] Acute renal failure, unspecified acute renal failure type ([place]) [N17.9] Patient Active Problem List   Diagnosis Date Noted   Intertrigo    Metabolic acidosis    Cellulitis 05/20/2022   Vitamin D deficiency 05/20/2022   Acute renal failure (ARF) ([place]) 05/20/2022   Headache 02/16/2022   Disequilibrium 12/01/2021   (HFpEF) heart failure with preserved ejection fraction ([place]) 07/18/2021   Exacerbation of RAD (reactive airway disease) 02/27/2020   Stage 4 chronic kidney disease ([place]) 02/27/2020   Tobacco use 02/27/2020   Prolonged Q-T interval on ECG 04/27/2018   Diabetes mellitus type 2 in obese ([place]) 03/16/2018   Severe obesity (BMI >= 40) (HCC)    CVA (cerebral vascular accident) ([place]) 03/15/2018   Peripheral vascular disease, unspecified ([place]) 11/30/2012   Hyperlipidemia 07/20/2007   Primary hypertension 07/20/2007   PCP:  Rick Duff, MD Pharmacy:   [facility] ([facility] [place] [address] [place] [address] Phone: [phone] Fax: [phone]     Social Determinants of Health (SDOH) Interventions    Readmission Risk Interventions    03/02/2020    1:08 PM  Readmission Risk Prevention Plan  Transportation Screening Complete  PCP or Specialist Appt within 5-7 Days Complete  Home Care Screening Complete  Medication Review (RN CM) Complete

## 2022-05-25 NOTE — Progress Notes (Signed)
Physical Therapy Treatment Patient Details Name: Alicia [name] MRN: [id number] DOB: 1951-04-25 Today's Date: 05/25/2022   History of Present Illness 71 y/o female presented to ED on 05/20/22 for generalized weakness and bilateral breast pain from skin infection after being discharged from rehab on 6/24. Admitted for acute renal failure and and cellulitis of breast and pannus. PMH: CHF, HTN, PVD, chronic respiratory failure on 2L supplemental oxygen, hx of CVA, T2DM, CKD4.    PT Comments    Pt initially refuses mobility due to stomach pain, reporting that she is going home today. PT points out that she has not ambulated with therapy the whole time she has been admitted. Pt reluctantly agrees to therapy. Pt is severely limited in mobility today by dizziness not related to BP change. Dizziness increases with coming to EoB and is barely able to stand for 1 min for BP measurement in standing due to increased dizziness. Pt is min A for bed mobility and transfers, however is unable to do more that take lateral steps towards to HoB. Pt reports that she has not had anything to eat since her hospitalization due to increased abdominal pain, reporting she has been drinking a large quantity of juice, water and soda to compensate. Pt currently lacks strength and endurance needed to discharge home alone. PT recommending SNF level rehab at discharge.    Recommendations for follow up therapy are one component of a multi-disciplinary discharge planning process, led by the attending physician.  Recommendations may be updated based on patient status, additional functional criteria and insurance authorization.  Follow Up Recommendations  Skilled nursing-short term rehab (<3 hours/day) Can patient physically be transported by private vehicle: No   Assistance Recommended at Discharge Intermittent Supervision/Assistance  Patient can return home with the following Assistance with cooking/housework;Assist for  transportation;Help with stairs or ramp for entrance;A lot of help with walking and/or transfers;A lot of help with bathing/dressing/bathroom   Equipment Recommendations  None recommended by PT       Precautions / Restrictions Precautions Precautions: Fall Restrictions Weight Bearing Restrictions: No     Mobility  Bed Mobility Overal bed mobility: Needs Assistance Bed Mobility: Supine to Sit     Supine to sit: Supervision, HOB elevated Sit to supine: Min assist, HOB elevated   General bed mobility comments: Supervision for pulling on bed rail with HoB elevated to bring LE off bed and for bringing trunk to upright    Transfers Overall transfer level: Needs assistance Equipment used: Rolling walker (2 wheels) Transfers: Sit to/from Stand, Bed to chair/wheelchair/BSC Sit to Stand: Min assist           General transfer comment: min A for power up to standing in RW, can not tolerate standing for >1 min due to increase in dizziness requesting to return to sitting. on second attempt pt able to come to standing and take 4 lateral steps towards the HoB before return to seated, with request to return to bed    Ambulation/Gait               General Gait Details: pt unable due to dizziness      Balance Overall balance assessment: Needs assistance Sitting-balance support: No upper extremity supported, Feet supported Sitting balance-Leahy Scale: Good     Standing balance support: Bilateral upper extremity supported Standing balance-Leahy Scale: Poor Standing balance comment: requires B UE assist to maintain upright  Cognition Arousal/Alertness: Awake/alert Behavior During Therapy: WFL for tasks assessed/performed Overall Cognitive Status: Impaired/Different from baseline Area of Impairment: Safety/judgement                         Safety/Judgement: Decreased awareness of safety, Decreased awareness of deficits      General Comments: pt lives alone and has not been up with therapy during her stay, reporting she can go home, when pressed to participate in therapy pt unable to stand           General Comments General comments (skin integrity, edema, etc.): BP in seated 126/71 in standing 114/72 return to supine 122/71, pt reports she has not eaten since she has been here due to abdomenal pain, has only had water, soda and juice      Pertinent Vitals/Pain Pain Assessment Pain Assessment: Faces Faces Pain Scale: Hurts even more Pain Location: abdomen Pain Descriptors / Indicators: Aching, Discomfort, Grimacing, Guarding Pain Intervention(s): Limited activity within patient's tolerance, Monitored during session, Repositioned     PT Goals (current goals can now be found in the care plan section) Acute Rehab PT Goals Patient Stated Goal: to go home and not rehab PT Goal Formulation: With patient Time For Goal Achievement: 06/04/22 Potential to Achieve Goals: Good Progress towards PT goals: Not progressing toward goals - comment    Frequency    Min 3X/week      PT Plan Discharge plan needs to be updated       AM-PAC PT "6 Clicks" Mobility   Outcome Measure  Help needed turning from your back to your side while in a flat bed without using bedrails?: A Little Help needed moving from lying on your back to sitting on the side of a flat bed without using bedrails?: A Little Help needed moving to and from a bed to a chair (including a wheelchair)?: A Lot Help needed standing up from a chair using your arms (e.g., wheelchair or bedside chair)?: A Lot Help needed to walk in hospital room?: A Lot Help needed climbing 3-5 steps with a railing? : Total 6 Click Score: 13    End of Session Equipment Utilized During Treatment: Gait belt Activity Tolerance: Treatment limited secondary to medical complications (Comment) (dizziness) Patient left: in bed;with call bell/phone within reach;with bed  alarm set Nurse Communication: Mobility status;Other (comment) (dizziness prohibiting ambulation) PT Visit Diagnosis: Unsteadiness on feet (R26.81);Muscle weakness (generalized) (M62.81);Difficulty in walking, not elsewhere classified (R26.2);Dizziness and giddiness (R42)     Time: 3662-9476 PT Time Calculation (min) (ACUTE ONLY): 30 min  Charges:  $Therapeutic Activity: 23-37 mins                     Floetta Brickey B. Migdalia Dk PT, DPT Acute Rehabilitation Services Please use secure chat or  Call Office [phone]    [place] 05/25/2022, 12:31 PM

## 2022-05-25 NOTE — TOC Transition Note (Signed)
Transition of Care [facility]) - CM/SW Discharge Note   Patient Details  Name: Alicia [name] MRN: [id number] Date of Birth: 09-18-1951  Transition of Care [facility]) CM/SW Contact:  Milinda Antis, [place] Phone Number: 05/25/2022, 3:35 PM   Clinical Narrative:    Patient will DC to: Home with [facility] services Anticipated DC date: 05/25/2022 Transport by: Corey Harold   Per MD patient ready for DC home with home health.  CSW spoke with the patient again about PT's recommendation for SNF.  The patient reports that she "needs to go home".  [name] will be providing home health services.  The patient will receive medications from the [place]. Ambulance transport  will be requested for patient.   CSW will sign off for now as social work intervention is no longer needed. Please consult [place] again if new needs arise.     Final next level of care: [place] Barriers to Discharge: Barriers Resolved   Patient Goals and CMS Choice Patient states their goals for this hospitalization and ongoing recovery are:: To return home CMS Medicare.gov Compare Post Acute Care list provided to:: Patient Choice offered to / list presented to : Patient  Discharge Placement                Patient to be transferred to facility by: PTAR      Discharge Plan and Services   Discharge Planning Services: CM Consult Post Acute Care Choice: Home Health          DME Arranged: N/A DME Agency: NA       HH Arranged: PT, OT [place] Agency: [place] Date [place]: 05/21/22 Time [place]: [id number] Representative spoke with at [place]: Adela Lank  Social Determinants of Health (SDOH) Interventions     Readmission Risk Interventions    03/02/2020    1:08 PM  Readmission Risk Prevention Plan  Transportation Screening Complete  PCP or Specialist Appt within 5-7 Days Complete  Home Care Screening Complete  Medication Review (RN CM) Complete

## 2022-05-25 NOTE — Care Management Important Message (Signed)
Important Message  Patient Details  Name: Alicia [name] MRN: [id number] Date of Birth: 1951-09-01   Medicare Important Message Given:  Yes     [name] 05/25/2022, 1:51 PM

## 2022-05-25 NOTE — Discharge Summary (Cosign Needed)
Name: Alicia [name] MRN: [id number] DOB: 03-23-1951 71 y.o. PCP: Rick Duff, MD  Date of Admission: 05/20/2022  6:32 PM Date of Discharge: 05/25/2022 3:35 PM Attending Physician: Dr.  Charise Killian  Discharge Diagnosis: Principal Problem:   Acute renal failure (ARF) ([place]) Active Problems:   Primary hypertension   Stage 4 chronic kidney disease ([place])   Intertrigo   Metabolic acidosis    Discharge Medications: Allergies as of 05/25/2022       Reactions   Nifedipine Er Other (See Comments)   Caused nose bleeds in higher doses (90 mg., namely)   Atorvastatin Swelling   Lip swelling to lipitor Patient tolerates crestor   Latex Rash        Medication List     STOP taking these medications    acetaminophen 650 MG CR tablet Commonly known as: TYLENOL Replaced by: Acetaminophen Extra Strength 500 MG tablet   aspirin EC 81 MG tablet --  Patient was called and instructed to take this medication as prescribed by Romana Juniper, MD 05/25/2022 at 7:30 PM   cephALEXin 500 MG capsule Commonly known as: KEFLEX   clotrimazole 1 % external solution Commonly known as: LOTRIMIN   furosemide 40 MG tablet Commonly known as: LASIX   spironolactone 100 MG tablet Commonly known as: ALDACTONE       TAKE these medications    Acetaminophen Extra Strength 500 MG tablet Generic drug: acetaminophen Take 2 tablets (1,000 mg total) by mouth every 6 (six) hours. Replaces: acetaminophen 650 MG CR tablet   albuterol 108 (90 Base) MCG/ACT inhaler Commonly known as: VENTOLIN HFA Inhale 2 puffs into the lungs every 6 (six) hours as needed for wheezing or shortness of breath.   amLODipine 10 MG tablet Commonly known as: NORVASC Take 1 tablet (10 mg total) by mouth daily.   blood glucose meter kit and supplies per insurance preference. Check cbg once a day. Dx E11.9   diclofenac Sodium 1 % Gel Commonly known as: VOLTAREN Apply 4 g topically 4 (four) times daily. Apply  to both knees. What changed:  how much to take when to take this reasons to take this additional instructions   doxycycline 100 MG tablet Commonly known as: VIBRA-TABS Take 1 tablet (100 mg total) by mouth every 12 (twelve) hours for 2 days. Give 2 hours before or 4 hours after iron.   metoprolol succinate 100 MG 24 hr tablet Commonly known as: TOPROL-XL Take 1 tablet (100 mg total) by mouth daily. Take with or immediately following a meal. What changed: when to take this   nicotine 14 mg/24hr patch Commonly known as: NICODERM CQ - dosed in mg/24 hours Place 1 patch (14 mg total) onto the skin daily.   OXYGEN Inhale 2 L into the lungs daily as needed (oxygen).   rosuvastatin 10 MG tablet Commonly known as: CRESTOR Take 1 tablet (10 mg total) by mouth every evening.   Senexon-S 8.6-50 MG tablet Generic drug: senna-docusate Take 2 tablets by mouth at bedtime.   sitaGLIPtin 25 MG tablet Commonly known as: Januvia Take 1 tablet (25 mg total) by mouth daily.   sodium bicarbonate 650 MG tablet Take 2 tablets (1,300 mg total) by mouth 2 (two) times daily.   Vitamin D (Ergocalciferol) 1.25 MG (50000 UNIT) Caps capsule Commonly known as: DRISDOL Take 1 capsule (50,000 Units total) by mouth every 7 (seven) days for 6 doses.        Disposition and follow-up:   AliciaElanda D  Sinning was discharged from [facility] in [place] condition.  At the hospital follow up visit please address:  1.  Follow-up:  a. AKI on CKD stage IV (baseline Cr. 2.8-3.2)  - Follow up BUN, Cr, anion gap,and urine output  - Holding home furosemide and spironolactone - Assess PO intake and volume status and need to restart home diuretics at 1 week follow-up  - Patient will follow up with nephrology, Dr. Joylene Grapes, in 3 weeks    b. Metabolic Acidosis - Repeat BMP to assess anion gap - Continue sodium bicarbonate 1300 mg daily - Patient will follow up with nephrology, Dr. Joylene Grapes, in 3  weeks   c. Diastolic Heart Failure  - Assess for signs of volume overload  -  Holding home furosemide and spironolactone given AKI during admission  - Reassess need for restarting home diuretics   d. HTN  - Systolic BP in the 811-914N during hospitalization  - Monitor BP  - Continue home amlodipine and metroprolol    e. Bilateral under breast wound infection  - Monitor for signs of improvement  - Patient will finish Doxycycline 100 mg twice daily until 7/6  - Continue interdry silver-impregnated fabric until 7/6  - If no improvement or worsening of wound, consider referral to outpatient wound clinic   f. Pain management  - monitor osteoarthritic and musculoskeletal pain  - Monitor medication use given CKD  g. Peripheral arterial disease - Follow up ABI doppler scan, new abnormal left lower extremity ABI - Monitor for signs and symptoms of ischemia - Medical management of symptoms   h. Constipation  - Monitor BM frequency  - Adjust bowel regimen medications  2.  Labs / imaging needed at time of follow-up: cbc, BMP, BNP  3.  Pending labs/ test needing follow-up: None  4.  Medication Changes   CONTINUE: ASA 81 mg for CVA prevention   STOP - Furosemide and spironolactone        End Date: pending PCP and Nephrology assessment   Added medications  Sodium bicarbonate 1300 mg BID               End Date: pending PCP and Nephrology assessment  Abx - Doxycycline 100 mg BID  End Date: 05/26/2022    Follow-up Appointments: PCP at [place] [facility] within one week - patient will be contacted Nephrology appointment with Dr. Joylene Grapes in 3 weeks Home health as detailed below:  Follow-up Information     [facility], [facility] Follow up.   Specialty: Home Health Services Why: someone from the office will call to schedule home health visits Contact information: [place] [id number] [phone]                 Hospital Course by problem  list: Alicia [name] is a 71 year old woman with a PMHx of HFpEF, Chronic kidney disease (stage 4), HTN, HLD, noninsulin dependent T2DM, CVA (2019) who presented to the [facility] ED with breast pain from bilateral under-the-breast skin infection admitted for acute on chronic renal failure, cellulitis, and comorbidity detailed below:   Acute kidney injury Chronic kidney disease, stage 4 Reduced Urine output Patient's baseline Cr 2.8-3.2. At presentation, patient's Cr was 6.6. presented acute kidney injury likely in the setting of high dose diuretics and limited PO intake. Diuretics were held and gentle IVF administration was administered. Patient was encouraged PO intake without need for further IVF boluses. Today Cr. 4.5. Patient vital signs  have remained stable and with the downtrending Cr, patient is deemed stable for discharge with follow up in the [facility] clinic within a week after discharge. Patient will be followed in the nephrology clinic in 3 weeks. Patient was discouraged from taking NSAIDs. Initially during medication reconciliation, ASA 81 mg was discontinued. However, given patient history of CVA in 2019, patient was called at home and instructed to take 81 mg ASA PO as prescribed prior to hospitalization.   Metabolic Acidosis Patient presented with metabolic acidosis with both GAP and non Gap likely in the setting of AKI and chronic kidney disease. Patient's bicarbonate had been managed with sodium bicarbonate 1300 mg BID per nephrology's recommendation. Patient will continue sodium bicarbonate therapy until clinical and laboratory data is reassessed during follow up with PCP and nephrology.   HFpEF  Last echocardiogram on 02/2022 showed 55-60% with grade II diastolic dysfunction. On admission, BNP was 154 with no evidence of pulmonary edema or pleural effusions on CXR. Given patient's acute on chronic kidney failure, decision was made to hold diuretics during this hospitalization. Patient BUN and  Cr are improving, however, not at goal. No signs of volume overload on exam or laboratory data. Patient is being discharged without home diuretics until clinical reassessment on follow up.  Wound infection Cellulitis intertrigo Patient presented with localized pain under bilateral breasts. Upon examination, a large, well-demarcated, erythematous, pain >10 wound oozing serosanguinous fluid under the L breast and a much smaller (<5cm) under the R breast was identified. Patient has been prescribed Keflex prior to ED presentation, and was switched to doxycycline given AKI. Wound care was consulted and topical management with silver-impregnated fabric was ordered. Patient should complete both therapies by 05/26/2022.  Mechanical Fall Bilateral Knee osteoarthritis Patient suffered a mechanical fall attempting to ambulate to bedside commode, braced with the L arm, without LOC or head impact. No gross deformities or limited ROM on exam. Bilateral knee films showed osteoarthritic changes. Patient currently managing musculoskeletal pain and arthritic pain with topical diclofenac gel and 1000 mg tylenol for times a day.  Peripheral arterial disease Known history of arterial disease. Patient ABI was repeated during this hospitalization given marked increased in lower extremity pain, edema, and weakness. Right and L toe-brachial index were abnormal, with resting right ABI indicating moderate right lower extremity arterial disease. PAD has worsened in the L lower extremity when compared to ABI study done in 2020. Will require outpatient follow up for monitoring and medical management of symptoms. Continue ASA 81 mg daily.  Hypertension Patient's blood pressures were marginally elevated  ~130-150s during this hospitalization. Patient was kept on home antihypertensive regimen with amlodipine 10 mg and metoprolol 100 mg.   Type 2 Diabetes Mellitus Patient's last A1c on 03/2022 was 7.8%. Patients blood sugars were  controlled with home Januvia doses.   Deconditioning Physical therapy was concerned about weakness, impaired balance and decreased activity tolerance. Patient was recommended to discharge to skilled nursing-short term rehab, noting that she could return home with assistance. Patient declined this recommendation despite knowing the risks associated with this. Patient is now being discharged with Home Health physical therapy and occupational therapy.    Discharge Subjective: Feeling better from leg pain, no nausea this am. Continues to try drinking water but little appetite.   Discharge Exam:   BP 136/70   Pulse 71   Temp 98.4 F (36.9 C) (Oral)   Resp 18   Ht 5' 8"  (1.727 m)   Wt 97.8 kg   SpO2  97%   BMI 32.78 kg/m  Constitutional: tired appearing woman with mild discomfort Cardiovascular: regular rate and rhythm, no m/r/g Pulmonary/Chest: normal work of breathing on room air, lungs clear to auscultation bilaterally Abdominal: soft, non-tender, non-distended MSK: normal bulk and tone.  pain with movement of lower extremities. No peripheral edema. Mild tenderness overlying L knee Neurological: alert & oriented x 3 Skin: warm and dry.  bilateral under breast wounds: L>> R erythematous, well demarcated wound, now without serous fluid and scar formation on the periphery. Barrier cream over site -- continues to improve.  Psych: appropriate mood and affect  Pertinent Labs, Studies, and Procedures:     Latest Ref Rng & Units 05/22/2022    1:47 AM 05/21/2022   12:28 AM 05/20/2022    8:19 PM  CBC  WBC 4.0 - 10.5 K/uL 6.8  8.8  10.4   Hemoglobin 12.0 - 15.0 g/dL 10.6  11.5  12.3   Hematocrit 36.0 - 46.0 % 31.9  34.4  36.3   Platelets 150 - 400 K/uL 338  251  267        Latest Ref Rng & Units 05/25/2022    2:57 AM 05/24/2022    1:57 AM 05/23/2022    6:14 AM  CMP  Glucose 70 - 99 mg/dL 107  99  132   BUN 8 - 23 mg/dL 66  64  69   Creatinine 0.44 - 1.00 mg/dL 4.55  4.65  5.10   Sodium 135 -  145 mmol/L 134  136  137   Potassium 3.5 - 5.1 mmol/L 4.1  3.9  3.6   Chloride 98 - 111 mmol/L 103  105  104   CO2 22 - 32 mmol/L 20  21  20    Calcium 8.9 - 10.3 mg/dL 7.7  7.9  8.1     US RENAL  Result Date: 05/20/2022 CLINICAL DATA:  Acute renal failure. EXAM: RENAL / URINARY TRACT ULTRASOUND COMPLETE COMPARISON:  Renal ultrasound 12/02/2021. FINDINGS: Right Kidney: Renal measurements: 9.6 x 4.7 x 4.6 cm = volume: 108.0 mL, previously 108.6 mL. The renal cortex is slightly echogenic but it is less hyperechoic than previously. No mass, stone or hydronephrosis visualized. Left Kidney: Renal measurements: 11.1 x 4.5 x 5.0 cm = volume: 130.0 mL, previously 198.2 mL. The renal cortex is slightly echogenic but unchanged. No mass, stone or hydronephrosis visualized. Bladder: Contracted and not seen. Other: None. IMPRESSION: 1. Slightly increased bilateral renal cortical echogenicity, unchanged on the left and improved on the right consistent with medical renal disease. 2. Right kidney again noted smaller than left. 3. Bladder contracted and nonvisualized. Electronically Signed   By: Telford Nab M.D.   On: 05/20/2022 23:19   DG CHEST PORT 1 VIEW  Result Date: 05/20/2022 CLINICAL DATA:  [id number].  WEAKNESS; HX OF ARF EXAM: PORTABLE CHEST 1 VIEW COMPARISON:  Chest x-ray 03/14/2022 FINDINGS: Cardiomegaly. The heart and mediastinal contours are unchanged. Aortic calcification. No focal consolidation. No pulmonary edema. No pleural effusion. No pneumothorax. No acute osseous abnormality. Severe degenerative changes of the right shoulder with heterotopic calcification. IMPRESSION: 1. Cardiomegaly with no acute cardiopulmonary abnormality. Limited evaluation for pericardial effusion on the AP portable technique. 2.  Aortic Atherosclerosis (ICD10-I70.0). Electronically Signed   By: Iven Finn M.D.   On: 05/20/2022 22:32     Discharge Instructions      [name],  You came to the hospital because you  felt weak and dizzy. After evaluating you, we found out that  your kidney function had worsened, which we treated with some fluids, stopping your home diuretics, adding a  new medication called sodium bicarbonate, and avoiding medications that could hurt your kidneys. You were also seen by the kidney doctor while you were hospitalized; we monitored your creatinine, a marker of your kidney function, and it is getting better. You were also found to have a skin wound and infection under your breast, and are being treated with a course of antibiotics that should be finished at the end of the day on 7/6. Additionally, you fell and hit your knee but there was no fracture or swelling in your joint. We have some pain medication recommendations below to help with the discomfort. You are now being discharged to go home with the following recommendations:  - Please do not take your home furosemide or spironolactone - Avoid taking more pain medication than what we prescribed you or medications such as ibuprofen, aspirin as this could damage your kidneys - you will be contacted by the [facility] to schedule a follow you with a primary care provider so you can be seen in clinic within a week from today. Please let them know if you need transportation assistance. It is important you come to this visit to check your kidney function - Please do not remove bandage under your breast until the end of the day tomorrow 7/6. You can continue applying barrier cream for comfort and keep it dry.  New medications: - Doxycycline 100 mg twice a day: Take one tonight (7/5), one in the morning on Wednesday (7/6) morning, and Wednesday night - Sodium bicarbonate 1300 mg twice per day: continue taking this one until you see the primary care provider in clinic  Changes to medications - STOP furosemide and spironolactone - For pain: continue voltaren gel 4 times per day on your knees and Tylenol 1000 mg 4 times a day.   Please  continue taking your home Crestor, hypertension medications and monitoring your blood sugars.         Signed: Romana Juniper, MD 05/25/2022, 8:07 PM   Pager: [phone]

## 2022-05-25 NOTE — Discharge Instructions (Addendum)
Ms. Heldman,  You came to the hospital because you felt weak and dizzy. After evaluating you, we found out that your kidney function had worsened, which we treated with some fluids, stopping your home diuretics, adding a  new medication called sodium bicarbonate, and avoiding medications that could hurt your kidneys. You were also seen by the kidney doctor while you were hospitalized; we monitored your creatinine, a marker of your kidney function, and it is getting better. You were also found to have a skin wound and infection under your breast, and are being treated with a course of antibiotics that should be finished at the end of the day on 7/6. Additionally, you fell and hit your knee but there was no fracture or swelling in your joint. We have some pain medication recommendations below to help with the discomfort. You are now being discharged to go home with the following recommendations:  - Please do not take your home furosemide or spironolactone - Avoid taking more pain medication than what we prescribed you or medications such as ibuprofen, aspirin as this could damage your kidneys - you will be contacted by the [facility] to schedule a follow you with a primary care provider so you can be seen in clinic within a week from today. Please let them know if you need transportation assistance. It is important you come to this visit to check your kidney function - Please do not remove bandage under your breast until the end of the day tomorrow 7/6. You can continue applying barrier cream for comfort and keep it dry.  New medications: - Doxycycline 100 mg twice a day: Take one tonight (7/5), one in the morning on Wednesday (7/6) morning, and Wednesday night - Sodium bicarbonate 1300 mg twice per day: continue taking this one until you see the primary care provider in clinic  Changes to medications - STOP furosemide and spironolactone - For pain: continue voltaren gel 4 times per day on your  knees and Tylenol 1000 mg 4 times a day.   Please continue taking your home Crestor, hypertension medications and monitoring your blood sugars.

## 2022-05-25 NOTE — Progress Notes (Signed)
Phone encounter with [facility] pharmacy: Patient was ready for discharge now pending Rehab placement. Medications were sent to [facility] and they are holding them. If there are any changes to medication, they will fill/hold changes.  Romana Juniper, MD Internal Medicine PGY-1 [phone]

## 2022-05-25 NOTE — Progress Notes (Signed)
OT Cancellation Note  Patient Details Name: Alicia [name] MRN: [id number] DOB: Oct 14, 1951   Cancelled Treatment:    Reason Eval/Treat Not Completed: Patient declined, no reason specified (Patient states she is going home today and needs to find a ride and doesn't want to do therapy today.) Lodema Hong, [place]  Office [place] 05/25/2022, 10:50 AM

## 2022-05-25 NOTE — Progress Notes (Signed)
Patient ID: Alicia [name], female   DOB: [birth year], 71 y.o.   MRN: [id number] [facility] Progress Note   Assessment/ Plan:   1. Acute kidney Injury on chronic kidney disease stage IV (baseline creatinine 2.8-3.2): Nonoliguric with reduction of urine output noted overnight but without acute electrolyte abnormality or intercurrent renal injury.  She appears to have suffered hemodynamically mediated acute kidney injury in the setting of high-dose diuretics and these have currently been held; renal function essentially unchanged overnight raising the possibility that she might settle out at a lower baseline GFR versus display protracted recovery.  Agree with plans to hold diuretics until followed up in 1 week with the internal medicine clinic.  She has scheduled follow-up in 3 weeks with Dr. Joylene [name] and may plausibly be discharged home today. 2.  History of diastolic heart failure: Diuretics currently on hold for acute kidney injury/prerenal azotemia and she appears clinically compensated with regards to her heart failure. 3.  Hypertension: Blood pressures marginally elevated, continue amlodipine and metoprolol while holding furosemide/spironolactone. 4.  Metabolic acidosis: With both gap and nongap components; the former is likely from acute kidney injury with the latter from chronic kidney disease.  Continue sodium bicarbonate upon discharge at current dose.  Subjective:   Reports to be feeling well this morning, denies any chest pain or shortness of breath.   Objective:   BP (!) 141/81 (BP Location: Left Arm)   Pulse 76   Temp 98.4 F (36.9 C) (Oral)   Resp 18   Ht '5\' 8"'$  (1.727 m)   Wt 97.8 kg   SpO2 98%   BMI 32.78 kg/m   Intake/Output Summary (Last 24 hours) at 05/25/2022 0847 Last data filed at 05/25/2022 2376 Gross per 24 hour  Intake 1210 ml  Output 700 ml  Net 510 ml   Weight change:   Physical Exam: Gen: Sleeping comfortably in bed, awakens to voice CVS:  Pulse regular rhythm, normal rate, S1 and S2 normal Resp: Clear to auscultation, no rales/rhonchi Abd: Soft, obese, nontender, bowel sounds normal Ext: No lower extremity edema  Imaging: VAS [place] ABI WITH/WO TBI  Result Date: 05/23/2022  LOWER EXTREMITY DOPPLER STUDY [name]  Date of Exam:   05/23/2022 Medical Rec #: [id number]             Accession #:    [id number] Date of Birth: 1951/01/11             Patient Gender: F Patient Age:   17 years Exam Location:  [facility] Procedure:      VAS [place] ABI WITH/WO TBI Referring Phys: JULIE MACHEN --------------------------------------------------------------------------------  Indications: Peripheral artery disease. High Risk Factors: Hypertension, Diabetes.  Comparison Study: 02/01/2019 - Right: Resting right ankle-brachial index                   indicates moderate right lower                   extremity arterial disease. The right toe-brachial index is                   abnormal.                    Left: Resting left ankle-brachial index is within normal                   range. No  evidence of significant left lower extremity arterial disease.                   The left                   toe-brachial index is normal. Performing Technologist: Carlos Levering RVT  Examination Guidelines: A complete evaluation includes at minimum, Doppler waveform signals and systolic blood pressure reading at the level of bilateral brachial, anterior tibial, and posterior tibial arteries, when vessel segments are accessible. Bilateral testing is considered an integral part of a complete examination. Photoelectric Plethysmograph (PPG) waveforms and toe systolic pressure readings are included as required and additional duplex testing as needed. Limited examinations for reoccurring indications may be performed as noted.  ABI Findings: +---------+------------------+-----+----------+--------+ Right    Rt Pressure (mmHg)IndexWaveform   Comment  +---------+------------------+-----+----------+--------+ Brachial 142                    triphasic          +---------+------------------+-----+----------+--------+ PTA      87                0.58 monophasic         +---------+------------------+-----+----------+--------+ DP       88                0.59 monophasic         +---------+------------------+-----+----------+--------+ Great Toe48                0.32                    +---------+------------------+-----+----------+--------+ +---------+------------------+-----+---------+-------+ Left     Lt Pressure (mmHg)IndexWaveform Comment +---------+------------------+-----+---------+-------+ Brachial 149                    triphasic        +---------+------------------+-----+---------+-------+ PTA                             absent           +---------+------------------+-----+---------+-------+ DP       86                0.58                  +---------+------------------+-----+---------+-------+ Great Toe50                0.34                  +---------+------------------+-----+---------+-------+ +-------+-----------+-----------+------------+------------+ ABI/TBIToday's ABIToday's TBIPrevious ABIPrevious TBI +-------+-----------+-----------+------------+------------+ Right  0.59       0.32                                +-------+-----------+-----------+------------+------------+ Left   0.58       0.34                                +-------+-----------+-----------+------------+------------+  Summary: Right: Resting right ankle-brachial index indicates moderate right lower extremity arterial disease. The right toe-brachial index is abnormal. Left: Resting left ankle-brachial index indicates moderate left lower extremity arterial disease. The left toe-brachial index is abnormal. *See table(s) above for measurements and observations.  Electronically signed by Deitra Mayo MD on  05/23/2022 at 4:24:29 PM.    Final  Labs: BMET Recent Labs  Lab 05/19/22 1151 05/20/22 2019 05/21/22 0028 05/21/22 0758 05/22/22 0625 05/23/22 0614 05/24/22 0157 05/25/22 0257  NA 133* 134* 134*  --  135 137 136 134*  K 5.1 4.9 4.5  --  3.9 3.6 3.9 4.1  CL 95* 98 102  --  103 104 105 103  CO2 16* 19* 17*  --  18* 20* 21* 20*  GLUCOSE 179* 119* 96  --  98 132* 99 107*  BUN 84* 95* 87*  --  76* 69* 64* 66*  CREATININE 6.95* 7.23* 6.63*  --  5.71* 5.10* 4.65* 4.55*  CALCIUM 8.7 8.3* 7.9* 7.9* 7.9* 8.1* 7.9* 7.7*  PHOS  --   --   --  4.2 3.5 3.6 3.2 3.8   CBC Recent Labs  Lab 05/19/22 1151 05/20/22 2019 05/21/22 0028 05/22/22 0147  WBC 13.9* 10.4 8.8 6.8  NEUTROABS  --   --   --  5.1  HGB 13.8 12.3 11.5* 10.6*  HCT 40.6 36.3 34.4* 31.9*  MCV 86 87.3 87.1 87.4  PLT 296 267 251 338    Medications:     acetaminophen  1,000 mg Oral Q6H   Or   acetaminophen  650 mg Rectal Q6H   amLODipine  10 mg Oral Daily   aspirin EC  81 mg Oral QPM   diclofenac Sodium  4 g Topical QID   doxycycline  100 mg Oral Q12H   Gerhardt's butt cream   Topical BID   heparin  5,000 Units Subcutaneous Q8H   linagliptin  5 mg Oral Daily   metoprolol succinate  100 mg Oral Daily   rosuvastatin  10 mg Oral QPM   senna-docusate  2 tablet Oral QHS   sodium bicarbonate  1,300 mg Oral BID    Elmarie Shiley, MD 05/25/2022, 8:47 AM

## 2022-05-30 ENCOUNTER — Emergency Department (HOSPITAL_COMMUNITY): Payer: Medicare HMO

## 2022-05-30 ENCOUNTER — Other Ambulatory Visit (HOSPITAL_COMMUNITY): Payer: Self-pay

## 2022-05-30 ENCOUNTER — Emergency Department (HOSPITAL_COMMUNITY)
Admission: EM | Admit: 2022-05-30 | Discharge: 2022-05-30 | Disposition: A | Discharge status: Home / Self Care | Payer: Medicare HMO | Attending: Emergency Medicine | Admitting: Emergency Medicine

## 2022-05-30 ENCOUNTER — Telehealth: Payer: Self-pay | Admitting: *Deleted

## 2022-05-30 ENCOUNTER — Other Ambulatory Visit: Payer: Self-pay

## 2022-05-30 ENCOUNTER — Encounter (HOSPITAL_COMMUNITY): Payer: Self-pay

## 2022-05-30 DIAGNOSIS — R42 Dizziness and giddiness: Secondary | ICD-10-CM | POA: Diagnosis not present

## 2022-05-30 DIAGNOSIS — R5383 Other fatigue: Secondary | ICD-10-CM | POA: Diagnosis present

## 2022-05-30 DIAGNOSIS — I10 Essential (primary) hypertension: Secondary | ICD-10-CM | POA: Diagnosis not present

## 2022-05-30 DIAGNOSIS — E875 Hyperkalemia: Secondary | ICD-10-CM | POA: Insufficient documentation

## 2022-05-30 DIAGNOSIS — Z79899 Other long term (current) drug therapy: Secondary | ICD-10-CM | POA: Diagnosis not present

## 2022-05-30 DIAGNOSIS — K5730 Diverticulosis of large intestine without perforation or abscess without bleeding: Secondary | ICD-10-CM | POA: Diagnosis not present

## 2022-05-30 DIAGNOSIS — Z91040 Latex allergy status: Secondary | ICD-10-CM | POA: Diagnosis not present

## 2022-05-30 DIAGNOSIS — K760 Fatty (change of) liver, not elsewhere classified: Secondary | ICD-10-CM | POA: Diagnosis not present

## 2022-05-30 DIAGNOSIS — K2900 Acute gastritis without bleeding: Secondary | ICD-10-CM | POA: Diagnosis not present

## 2022-05-30 DIAGNOSIS — R531 Weakness: Secondary | ICD-10-CM | POA: Insufficient documentation

## 2022-05-30 DIAGNOSIS — N261 Atrophy of kidney (terminal): Secondary | ICD-10-CM | POA: Diagnosis not present

## 2022-05-30 DIAGNOSIS — K7689 Other specified diseases of liver: Secondary | ICD-10-CM | POA: Diagnosis not present

## 2022-05-30 DIAGNOSIS — R739 Hyperglycemia, unspecified: Secondary | ICD-10-CM | POA: Diagnosis not present

## 2022-05-30 LAB — URINALYSIS, ROUTINE W REFLEX MICROSCOPIC
Bilirubin Urine: NEGATIVE
Glucose, UA: 50 mg/dL — AB
Ketones, ur: NEGATIVE mg/dL
Leukocytes,Ua: NEGATIVE
Nitrite: NEGATIVE
Protein, ur: 100 mg/dL — AB
Specific Gravity, Urine: 1.015 (ref 1.005–1.030)
pH: 5 (ref 5.0–8.0)

## 2022-05-30 LAB — CBC WITH DIFFERENTIAL/PLATELET
Abs Immature Granulocytes: 0.06 10*3/uL (ref 0.00–0.07)
Basophils Absolute: 0 10*3/uL (ref 0.0–0.1)
Basophils Relative: 0 %
Eosinophils Absolute: 0 10*3/uL (ref 0.0–0.5)
Eosinophils Relative: 0 %
HCT: 34.7 % — ABNORMAL LOW (ref 36.0–46.0)
Hemoglobin: 11.5 g/dL — ABNORMAL LOW (ref 12.0–15.0)
Immature Granulocytes: 1 %
Lymphocytes Relative: 11 %
Lymphs Abs: 1 10*3/uL (ref 0.7–4.0)
MCH: 29.8 pg (ref 26.0–34.0)
MCHC: 33.1 g/dL (ref 30.0–36.0)
MCV: 89.9 fL (ref 80.0–100.0)
Monocytes Absolute: 0.5 10*3/uL (ref 0.1–1.0)
Monocytes Relative: 6 %
Neutro Abs: 7.1 10*3/uL (ref 1.7–7.7)
Neutrophils Relative %: 82 %
Platelets: 348 10*3/uL (ref 150–400)
RBC: 3.86 MIL/uL — ABNORMAL LOW (ref 3.87–5.11)
RDW: 15.9 % — ABNORMAL HIGH (ref 11.5–15.5)
WBC: 8.7 10*3/uL (ref 4.0–10.5)
nRBC: 0.2 % (ref 0.0–0.2)

## 2022-05-30 LAB — COMPREHENSIVE METABOLIC PANEL
ALT: 10 U/L (ref 0–44)
AST: 12 U/L — ABNORMAL LOW (ref 15–41)
Albumin: 2.8 g/dL — ABNORMAL LOW (ref 3.5–5.0)
Alkaline Phosphatase: 97 U/L (ref 38–126)
Anion gap: 10 (ref 5–15)
BUN: 72 mg/dL — ABNORMAL HIGH (ref 8–23)
CO2: 23 mmol/L (ref 22–32)
Calcium: 8.3 mg/dL — ABNORMAL LOW (ref 8.9–10.3)
Chloride: 99 mmol/L (ref 98–111)
Creatinine, Ser: 3.88 mg/dL — ABNORMAL HIGH (ref 0.44–1.00)
GFR, Estimated: 12 mL/min — ABNORMAL LOW (ref >60)
Glucose, Bld: 169 mg/dL — ABNORMAL HIGH (ref 70–99)
Potassium: 5.3 mmol/L — ABNORMAL HIGH (ref 3.5–5.1)
Sodium: 132 mmol/L — ABNORMAL LOW (ref 135–145)
Total Bilirubin: 0.8 mg/dL (ref 0.3–1.2)
Total Protein: 6.7 g/dL (ref 6.5–8.1)

## 2022-05-30 LAB — POTASSIUM: Potassium: 5.2 mmol/L — ABNORMAL HIGH (ref 3.5–5.1)

## 2022-05-30 LAB — LIPASE, BLOOD: Lipase: 29 U/L (ref 11–51)

## 2022-05-30 LAB — CBG MONITORING, ED: Glucose-Capillary: 167 mg/dL — ABNORMAL HIGH (ref 70–99)

## 2022-05-30 LAB — TROPONIN I (HIGH SENSITIVITY): Troponin I (High Sensitivity): 20 ng/L — ABNORMAL HIGH (ref <18)

## 2022-05-30 MED ORDER — ALUM & MAG HYDROXIDE-SIMETH 200-200-20 MG/5ML PO SUSP
30.0000 mL | Freq: Once | ORAL | Status: AC
  Administered 2022-05-30: 30 mL via ORAL
  Filled 2022-05-30: qty 30

## 2022-05-30 MED ORDER — PANTOPRAZOLE SODIUM 40 MG PO TBEC: 40.0000 mg | DELAYED_RELEASE_TABLET | Freq: Two times a day (BID) | ORAL | 0 refills | Status: DC

## 2022-05-30 MED ORDER — PANTOPRAZOLE SODIUM 40 MG IV SOLR
40.0000 mg | Freq: Once | INTRAVENOUS | Status: AC
  Administered 2022-05-30: 40 mg via INTRAVENOUS
  Filled 2022-05-30: qty 10

## 2022-05-30 MED ORDER — SODIUM CHLORIDE 0.9 % IV BOLUS
500.0000 mL | Freq: Once | INTRAVENOUS | Status: AC
Start: 2022-05-30 — End: 2022-05-30
  Administered 2022-05-30: 500 mL via INTRAVENOUS

## 2022-05-30 MED ORDER — SODIUM ZIRCONIUM CYCLOSILICATE 10 G PO PACK
10.0000 g | PACK | Freq: Once | ORAL | Status: AC
  Administered 2022-05-30: 10 g via ORAL
  Filled 2022-05-30: qty 1

## 2022-05-30 NOTE — ED Provider Notes (Addendum)
[place] DEPT Provider Note   CSN: [id number] Arrival date & time: 05/30/22  1717     History  Chief Complaint  Patient presents with   Weakness   Dizziness    Alicia [name] is a 71 y.o. female.  HPI 71 year old female presents with generalized weakness.  She has been feeling weak and fatigued for about 4 days.  Has significantly decreased food intake and has no appetite.  She is drinking fluids.  No fevers, headache, chest pain, shortness of breath.  Denies any focal weakness but feels weak all over.  She always has with leg weakness but is a little worse.  Home Medications Prior to Admission medications   Medication Sig Start Date End Date Taking? Authorizing Provider  pantoprazole (PROTONIX) 40 MG tablet Take 1 tablet (40 mg total) by mouth 2 (two) times daily for 14 days. 05/30/22 06/13/22 Yes Sherwood Gambler, MD  acetaminophen (TYLENOL) 500 MG tablet Take 2 tablets (1,000 mg total) by mouth every 6 (six) hours. 05/25/22   Romana Juniper, MD  albuterol (VENTOLIN HFA) 108 (90 Base) MCG/ACT inhaler Inhale 2 puffs into the lungs every 6 (six) hours as needed for wheezing or shortness of breath. 12/10/21   Rick Duff, MD  amLODipine (NORVASC) 10 MG tablet Take 1 tablet (10 mg total) by mouth daily. 05/19/22 05/19/23  Lacinda Axon, MD  blood glucose meter kit and supplies per insurance preference. Check cbg once a day. Dx E11.9 01/06/20   Jacelyn Pi, Lilia Argue, MD  diclofenac Sodium (VOLTAREN) 1 % GEL Apply 4 g topically 4 (four) times daily. Apply to both knees. 05/25/22   Romana Juniper, MD  metoprolol succinate (TOPROL-XL) 100 MG 24 hr tablet Take 1 tablet (100 mg total) by mouth daily. Take with or immediately following a meal. Patient taking differently: Take 100 mg by mouth every evening. Take with or immediately following a meal. 05/19/22   Amponsah, Charisse March, MD  nicotine (NICODERM CQ - DOSED IN MG/24 HOURS) 14 mg/24hr  patch Place 1 patch (14 mg total) onto the skin daily. 05/19/22   Lacinda Axon, MD  OXYGEN Inhale 2 L into the lungs daily as needed (oxygen).    [provider]  rosuvastatin (CRESTOR) 10 MG tablet Take 1 tablet (10 mg total) by mouth every evening. 05/19/22   Lacinda Axon, MD  senna-docusate (SENOKOT-S) 8.6-50 MG tablet Take 2 tablets by mouth at bedtime. 05/25/22 06/24/22  Romana Juniper, MD  sitaGLIPtin (JANUVIA) 25 MG tablet Take 1 tablet (25 mg total) by mouth daily. 05/19/22   Lacinda Axon, MD  sodium bicarbonate 650 MG tablet Take 2 tablets (1,300 mg total) by mouth 2 (two) times daily. 05/25/22 06/24/22  Romana Juniper, MD  Vitamin D, Ergocalciferol, (DRISDOL) 1.25 MG (50000 UNIT) CAPS capsule Take 1 capsule (50,000 Units total) by mouth every 7 (seven) days for 6 doses. Patient not taking: Reported on 05/20/2022 05/20/22 06/25/22  Lacinda Axon, MD      Allergies    Nifedipine er, Atorvastatin, and Latex    Review of Systems   Review of Systems  Constitutional:  Positive for appetite change and fatigue. Negative for fever.  Respiratory:  Negative for shortness of breath.   Cardiovascular:  Negative for chest pain.  Gastrointestinal:  Positive for nausea. Negative for abdominal pain, diarrhea and vomiting.  Neurological:  Positive for weakness. Negative for headaches.    Physical Exam Updated Vital Signs BP (!) 183/87   Pulse  67   Temp (!) 97.5 F (36.4 C) (Oral)   Resp 17   Ht _0  (1.727 m)   Wt 97 kg   SpO2 99%   BMI 32.52 kg/m  Physical Exam Vitals and nursing note reviewed.  Constitutional:      Appearance: She is well-developed. She is obese.  HENT:     Head: Normocephalic and atraumatic.  Cardiovascular:     Rate and Rhythm: Normal rate and regular rhythm.     Heart sounds: Normal heart sounds.  Pulmonary:     Effort: Pulmonary effort is normal.     Breath sounds: Normal breath sounds.  Abdominal:     General: There  is no distension.     Palpations: Abdomen is soft.     Tenderness: There is no abdominal tenderness.  Skin:    General: Skin is warm and dry.  Neurological:     Mental Status: She is alert.     Comments: Normal speech, no facial droop. 5/5 strength in BUE, 4/5 strength in BLE.      ED Results / Procedures / Treatments   Labs (all labs ordered are listed, but only abnormal results are displayed) Labs Reviewed  COMPREHENSIVE METABOLIC PANEL - Abnormal; Notable for the following components:      Result Value   Sodium 132 (*)    Potassium 5.3 (*)    Glucose, Bld 169 (*)    BUN 72 (*)    Creatinine, Ser 3.88 (*)    Calcium 8.3 (*)    Albumin 2.8 (*)    AST 12 (*)    GFR, Estimated 12 (*)    All other components within normal limits  CBC WITH DIFFERENTIAL/PLATELET - Abnormal; Notable for the following components:   RBC 3.86 (*)    Hemoglobin 11.5 (*)    HCT 34.7 (*)    RDW 15.9 (*)    All other components within normal limits  URINALYSIS, ROUTINE W REFLEX MICROSCOPIC - Abnormal; Notable for the following components:   APPearance HAZY (*)    Glucose, UA 50 (*)    Hgb urine dipstick SMALL (*)    Protein, ur 100 (*)    Bacteria, UA FEW (*)    All other components within normal limits  POTASSIUM - Abnormal; Notable for the following components:   Potassium 5.2 (*)    All other components within normal limits  CBG MONITORING, ED - Abnormal; Notable for the following components:   Glucose-Capillary 167 (*)    All other components within normal limits  TROPONIN I (HIGH SENSITIVITY) - Abnormal; Notable for the following components:   Troponin I (High Sensitivity) 20 (*)    All other components within normal limits  LIPASE, BLOOD    EKG EKG Interpretation  Date/Time:  Monday May 30 2022 18:13:18 EDT Ventricular Rate:  61 PR Interval:  155 QRS Duration: 114 QT Interval:  588 QTC Calculation: 593 R Axis:   -1 Text Interpretation: Sinus rhythm Probable left atrial  enlargement Abnormal R-wave progression, early transition LVH with secondary repolarization abnormality Prolonged QT interval Confirmed by Sherwood Gambler [phone]) on 05/30/2022 6:30:10 PM  Radiology CT ABDOMEN PELVIS WO CONTRAST  Result Date: 05/30/2022 CLINICAL DATA:  Abdominal pain. Weakness and dizziness. Patient reports wounds under breast. EXAM: CT ABDOMEN AND PELVIS WITHOUT CONTRAST TECHNIQUE: Multidetector CT imaging of the abdomen and pelvis was performed following the standard protocol without IV contrast. RADIATION DOSE REDUCTION: This exam was performed according to the departmental dose-optimization program  which includes automated exposure control, adjustment of the mA and/or kV according to patient size and/or use of iterative reconstruction technique. COMPARISON:  CT 04/22/2019 FINDINGS: Lower chest: No acute airspace disease. No pleural fluid. There are coronary artery calcifications, borderline cardiomegaly. Hepatobiliary: Mild hepatic steatosis. 16 mm cyst in the left hepatic dome, unchanged. Gallbladder physiologically distended, no calcified stone. No biliary dilatation. Pancreas: Fat stranding and edema adjacent to the 2/3 portion of the duodenum as well as the pancreatic head. No pancreatic ductal dilatation. No obvious pancreatic mass on this unenhanced exam. No pancreatic air. Spleen: Occasional calcified granuloma.  Normal in size. Adrenals/Urinary Tract: Mild left adrenal thickening but no adrenal nodule. No right adrenal nodule. Bilateral renal parenchymal atrophy. Renal vascular calcifications at the hilum. No hydronephrosis. Decompressed urinary bladder. Stomach/Bowel: There is wall thickening of the second and third portion of the duodenum abutting the pancreatic head. No other small bowel wall thickening or inflammatory change. No obstruction. The appendix is normal. Stool surrounded by air in the cecum, no bowel wall thickening or inflammation. Colonic diverticulosis from the mid  transverse colon distally. No focal diverticulitis. The sigmoid colon is redundant coursing into the left mid abdomen. Vascular/Lymphatic: Advanced aortic atherosclerosis. No abdominal aortic aneurysm by size criteria. Advanced branch atherosclerosis. No portal venous or mesenteric gas. There is no bulky abdominopelvic adenopathy on this unenhanced exam. Left femoral bypass graft. Reproductive: There is abnormal low-density within the lower endocervical canal 13 mm AP dimension. No adnexal mass. Other: Fat stranding and trace free fluid in the upper abdomen adjacent to the duodenum and pancreas. Free air. Moderate-sized fat containing umbilical hernia. Small left inguinal containing fat. Musculoskeletal: Degenerative change in the lower lumbar spine with degenerative disc disease and facet hypertrophy. No acute osseous findings. Probable lipoma of the left anterior abdominal wall musculature, stable. IMPRESSION: 1. Fat stranding and edema adjacent to the 2nd and 3rd portion of the duodenum as well as the pancreatic head. Findings may be secondary to pancreatitis or duodenitis/peptic ulcer disease. Recommend correlation with pancreatic enzymes. 2. Colonic diverticulosis without focal diverticulitis. 3. Abnormal low-density within the lower endocervical canal measuring 13 mm AP dimension. Recommend nonemergent pelvic ultrasound for characterization as an outpatient after resolution of acute event. 4. Hepatic steatosis. 5. Fat containing umbilical and left inguinal hernias. Aortic Atherosclerosis (ICD10-I70.0). Electronically Signed   By: Keith Rake M.D.   On: 05/30/2022 20:20   DG Chest 2 View  Result Date: 05/30/2022 CLINICAL DATA:  Weakness. EXAM: CHEST - 2 VIEW COMPARISON:  Chest x-ray 05/20/2022 FINDINGS: The heart size and mediastinal contours are within normal limits. Both lungs are clear. There are severe degenerative changes of both hips. IMPRESSION: No active cardiopulmonary disease. Electronically  Signed   By: Ronney Asters M.D.   On: 05/30/2022 19:05    Procedures Procedures    Medications Ordered in ED Medications  sodium chloride 0.9 % bolus 500 mL (0 mLs Intravenous Stopped 05/30/22 1909)  alum & mag hydroxide-simeth (MAALOX/MYLANTA) 200-200-20 MG/5ML suspension 30 mL (30 mLs Oral Given 05/30/22 2024)  pantoprazole (PROTONIX) injection 40 mg (40 mg Intravenous Given 05/30/22 2130)  sodium zirconium cyclosilicate (LOKELMA) packet 10 g (10 g Oral Given 05/30/22 2137)    ED Course/ Medical Decision Making/ A&P Clinical Course as of 05/30/22 2210  Mon May 30, 2022  1951 On repeat abdominal exam, there is some upper abdominal tenderness.  Given she is having lack of appetite we will get a CT.  She does not say that she  has had progressive dyspnea on exertion for months but is much worse over the past few days.  We will add on a troponin. [SG]    Clinical Course User Index [SG] Sherwood Gambler, MD                           Medical Decision Making Amount and/or Complexity of Data Reviewed External Data Reviewed: notes.    Details: Discharge summary from a few days ago. Labs: ordered.    Details: Creatinine is elevated at 3.88 but improved from most recent a few days ago.  Mild hyperkalemia 5.2.  No urinary tract infection.  Chronic anemia Radiology: ordered and independent interpretation performed.    Details: Inflammation near the stomach/pancreas. ECG/medicine tests: ordered and independent interpretation performed.    Details: Unchanged from baseline.  QTc appears prolonged on computer read but does not appear this long visually.  Risk OTC drugs. Prescription drug management.   Patient's lack of appetite appears to be due to gastritis.  No hematemesis.  Hemoglobin is unchanged.  She was given GI cocktail as well as IV Protonix.  She was given IV fluid bolus.  She is feeling better and it does not appear that there is any evidence of MI or worsening renal disease.  Given all  this, I think she is stable for discharge home with Protonix and follow-up with PCP.  Her potassium was slightly high at 5.3 which came down to 5.2.  Given a dose of Lokelma and she should follow-up with her PCP for repeat check in about a week or less.  Given return precautions.  Of note her QTc was prolonged on the first 1 but on repeat was normal.  I think the first 1 was missed calculated by the computer I think this is unlikely to represent significant QTc prolongation.   Patient was made aware of the need for an outpatient ultrasound to evaluate the thickening of her cervix.     Final Clinical Impression(s) / ED Diagnoses Final diagnoses:  Acute gastritis without hemorrhage, unspecified gastritis type  Hyperkalemia    Rx / DC Orders ED Discharge Orders          Ordered    pantoprazole (PROTONIX) 40 MG tablet  2 times daily        05/30/22 2134              Sherwood Gambler, MD 05/30/22 2226    Sherwood Gambler, MD 05/30/22 2238

## 2022-05-30 NOTE — Discharge Instructions (Addendum)
Your potassium was slightly elevated today.  Follow-up with your doctor to get this rechecked this week.  Your CT scan shows you likely have an inflamed stomach.  You are being given Protonix to help with this.  Avoid alcohol and spicy foods.  There was some thickening to your endometrial canal/cervix.  You will need an outpatient ultrasound that your primary care physician can set up of your pelvis to further evaluate this.  If you develop worsening, continued, or recurrent abdominal pain, uncontrolled vomiting, fever, chest or back pain, or any other new/concerning symptoms then return to the ER for evaluation.

## 2022-05-30 NOTE — ED Triage Notes (Signed)
Pt bib ems for weakness and dizziness x4 days.  Pt recently discharged from [facility] 4days ago.  Pt states SOB w/ exertion.  Pt also c/o wounds to underside breast tissue. Pt's CBG 255 for ems.

## 2022-05-30 NOTE — Telephone Encounter (Signed)
Received call from Raymondo Band, caregiver, stating patient has not eaten in 4 days and is very weak. Patient just d/c from [facility] on 7/5 for acute renal failure. Patient with known hx of hypertensive emergency. She is advised to take patient back to [facility] ED. States she will take her now.

## 2022-06-02 NOTE — Progress Notes (Signed)
Internal Medicine Clinic Attending ° °Case discussed with Dr. Amponsah  at the time of the visit.  We reviewed the resident’s history and exam and pertinent patient test results.  I agree with the assessment, diagnosis, and plan of care documented in the resident’s note.  °

## 2022-06-03 ENCOUNTER — Telehealth: Payer: Self-pay | Admitting: *Deleted

## 2022-06-03 NOTE — Telephone Encounter (Signed)
Please call Lucio Edward regarding this patient need orders.

## 2022-06-03 NOTE — Telephone Encounter (Signed)
Returned call to [place], [place] with [facility]. Requesting VO to resume HH PT to work on strengthening, transfers, gait and balance. Also, requesting Skilled Nursing Eval for med and disease management, and OT Eval to assist with new bathroom equipment and ADLs. Verbal auth given. Will route to Yellow Team for agreement/denial.

## 2022-06-16 ENCOUNTER — Other Ambulatory Visit: Payer: Self-pay

## 2022-06-16 ENCOUNTER — Encounter: Payer: Self-pay | Admitting: Internal Medicine

## 2022-06-16 ENCOUNTER — Ambulatory Visit (INDEPENDENT_AMBULATORY_CARE_PROVIDER_SITE_OTHER): Payer: Medicare HMO | Admitting: Internal Medicine

## 2022-06-16 VITALS — BP 148/77 | HR 79 | Temp 99.0°F | Resp 32 | Ht 68.0 in | Wt 227.4 lb

## 2022-06-16 DIAGNOSIS — I130 Hypertensive heart and chronic kidney disease with heart failure and stage 1 through stage 4 chronic kidney disease, or unspecified chronic kidney disease: Secondary | ICD-10-CM

## 2022-06-16 DIAGNOSIS — E1122 Type 2 diabetes mellitus with diabetic chronic kidney disease: Secondary | ICD-10-CM

## 2022-06-16 DIAGNOSIS — E875 Hyperkalemia: Secondary | ICD-10-CM | POA: Diagnosis not present

## 2022-06-16 DIAGNOSIS — D649 Anemia, unspecified: Secondary | ICD-10-CM | POA: Diagnosis not present

## 2022-06-16 DIAGNOSIS — E1169 Type 2 diabetes mellitus with other specified complication: Secondary | ICD-10-CM | POA: Diagnosis not present

## 2022-06-16 DIAGNOSIS — F17210 Nicotine dependence, cigarettes, uncomplicated: Secondary | ICD-10-CM

## 2022-06-16 DIAGNOSIS — E669 Obesity, unspecified: Secondary | ICD-10-CM | POA: Diagnosis not present

## 2022-06-16 DIAGNOSIS — L03319 Cellulitis of trunk, unspecified: Secondary | ICD-10-CM

## 2022-06-16 DIAGNOSIS — R42 Dizziness and giddiness: Secondary | ICD-10-CM

## 2022-06-16 DIAGNOSIS — Z7984 Long term (current) use of oral hypoglycemic drugs: Secondary | ICD-10-CM

## 2022-06-16 DIAGNOSIS — Z8673 Personal history of transient ischemic attack (TIA), and cerebral infarction without residual deficits: Secondary | ICD-10-CM

## 2022-06-16 DIAGNOSIS — N61 Mastitis without abscess: Secondary | ICD-10-CM

## 2022-06-16 DIAGNOSIS — I5032 Chronic diastolic (congestive) heart failure: Secondary | ICD-10-CM

## 2022-06-16 DIAGNOSIS — I10 Essential (primary) hypertension: Secondary | ICD-10-CM

## 2022-06-16 DIAGNOSIS — N184 Chronic kidney disease, stage 4 (severe): Secondary | ICD-10-CM

## 2022-06-16 LAB — CBC
HCT: 30.9 % — ABNORMAL LOW (ref 36.0–46.0)
Hemoglobin: 9.2 g/dL — ABNORMAL LOW (ref 12.0–15.0)
MCH: 29.6 pg (ref 26.0–34.0)
MCHC: 29.8 g/dL — ABNORMAL LOW (ref 30.0–36.0)
MCV: 99.4 fL (ref 80.0–100.0)
Platelets: 297 10*3/uL (ref 150–400)
RBC: 3.11 MIL/uL — ABNORMAL LOW (ref 3.87–5.11)
RDW: 17.4 % — ABNORMAL HIGH (ref 11.5–15.5)
WBC: 7.4 10*3/uL (ref 4.0–10.5)
nRBC: 0 % (ref 0.0–0.2)

## 2022-06-16 LAB — BASIC METABOLIC PANEL
Anion gap: 10 (ref 5–15)
BUN: 32 mg/dL — ABNORMAL HIGH (ref 8–23)
CO2: 21 mmol/L — ABNORMAL LOW (ref 22–32)
Calcium: 8.1 mg/dL — ABNORMAL LOW (ref 8.9–10.3)
Chloride: 109 mmol/L (ref 98–111)
Creatinine, Ser: 2.97 mg/dL — ABNORMAL HIGH (ref 0.44–1.00)
GFR, Estimated: 16 mL/min — ABNORMAL LOW (ref >60)
Glucose, Bld: 114 mg/dL — ABNORMAL HIGH (ref 70–99)
Potassium: 4.7 mmol/L (ref 3.5–5.1)
Sodium: 140 mmol/L (ref 135–145)

## 2022-06-16 LAB — POCT GLYCOSYLATED HEMOGLOBIN (HGB A1C): Hemoglobin A1C: 6.2 % — AB (ref 4.0–5.6)

## 2022-06-16 LAB — GLUCOSE, CAPILLARY: Glucose-Capillary: 128 mg/dL — ABNORMAL HIGH (ref 70–99)

## 2022-06-16 MED ORDER — SITAGLIPTIN PHOSPHATE 25 MG PO TABS: 25.0000 mg | ORAL_TABLET | Freq: Every day | ORAL | 2 refills | Status: DC

## 2022-06-16 NOTE — Patient Instructions (Addendum)
Thank you, Alicia [name] for allowing [place] to provide your care today. Today we discussed:  Kidneys I am rechecking labs today to see how your kidneys are doing.  They did improve in the hospital but are still not doing well.  I am going to message the kidney doctor, Dr. Osborne Casco.  It is very important that we get you plugged in with him.  I am concerned that your kidneys are trending towards needing dialysis.  Heart failure I will give you a call once we get your kidney labs.  If your kidneys are stable we may consider restarting the Lasix medication that you were on prior to admission in June.  Blood pressure Your blood pressure was elevated in clinic.  Please continue taking amlodipine 10 mg metoprolol 100 mg daily.  We will recheck your kidney labs today and may consider starting Lasix to help with your blood pressure.  Dizziness I refer you to vestibular rehab to be evaluated for vertigo.  Diabetes Your diabetes blood work looked better than in May.  I have sent in refills for Sitagliptin.  Low blood count We are checking some labs with your blood count.  I will call you with results in a couple of days.  Breast wound The wound on her breast appears to be healing well.  If you begin to notice swelling or redness around it please give the clinic a call and we will need to see you in person to evaluate.  I have ordered the following labs for you:   Lab Orders         Microalbumin / Creatinine Urine Ratio         Glucose, capillary         BMP w Anion Gap (STAT/Sunquest-performed on-site)         CBC no Diff         POC Hbg A1C       Referrals ordered today:   Referral Orders  No referral(s) requested today     I have ordered the following medication/changed the following medications:   Stop the following medications: Medications Discontinued During This Encounter  Medication Reason   sitaGLIPtin (JANUVIA) 25 MG tablet Reorder     Start the following  medications: Meds ordered this encounter  Medications   sitaGLIPtin (JANUVIA) 25 MG tablet    Sig: Take 1 tablet (25 mg total) by mouth daily.    Dispense:  90 tablet    Refill:  2     Follow up:  1 week    We look forward to seeing you next time. Please call our clinic at [phone] if you have any questions or concerns. The best time to call is Monday-Friday from 9am-4pm, but there is someone available 24/7. If after hours or the weekend, call the main hospital number and ask for the Internal Medicine Resident On-Call. If you need medication refills, please notify your pharmacy one week in advance and they will send [place] a request.   Thank you for trusting me with your care. Wishing you the best!   Christiana Fuchs, [place]

## 2022-06-16 NOTE — Progress Notes (Signed)
Subjective:  CC: hospital follow-up  HPI:  Alicia [name] is a 71 year old with past medical history of HFpEF, history of CVA, type 2 diabetes, CKD stage IV who presents for hospital and ED follow-up.  She was admitted from June until July 5 due to acute on chronic renal failure. She presented on July 10 with generalized weakness for about the last 4 days thought to be secondary to gastritis. Please see problem based assessment and plan for additional details.  Past Medical History:  Diagnosis Date   Allergy    Arthritis    Chronic headaches    Constipation    Depression    Diabetes mellitus    GERD (gastroesophageal reflux disease)    Hyperlipidemia    Hypertension    Obesity    PAD (peripheral artery disease) (HCC)    stage 4    Stroke [facility])     Current Outpatient Medications on File Prior to Visit  Medication Sig Dispense Refill   acetaminophen (TYLENOL) 500 MG tablet Take 2 tablets (1,000 mg total) by mouth every 6 (six) hours. 30 tablet 0   albuterol (VENTOLIN HFA) 108 (90 Base) MCG/ACT inhaler Inhale 2 puffs into the lungs every 6 (six) hours as needed for wheezing or shortness of breath. 18 g 2   amLODipine (NORVASC) 10 MG tablet Take 1 tablet (10 mg total) by mouth daily. 30 tablet 11   diclofenac Sodium (VOLTAREN) 1 % GEL Apply 4 g topically 4 (four) times daily. Apply to both knees. 100 g 0   metoprolol succinate (TOPROL-XL) 100 MG 24 hr tablet Take 1 tablet (100 mg total) by mouth daily. Take with or immediately following a meal. (Patient taking differently: Take 100 mg by mouth every evening. Take with or immediately following a meal.) 90 tablet 3   OXYGEN Inhale 2 L into the lungs daily as needed (oxygen).     pantoprazole (PROTONIX) 40 MG tablet Take 1 tablet (40 mg total) by mouth 2 (two) times daily for 14 days. 28 tablet 0   rosuvastatin (CRESTOR) 10 MG tablet Take 1 tablet (10 mg total) by mouth every evening. 90 tablet 1   senna-docusate (SENOKOT-S) 8.6-50 MG  tablet Take 2 tablets by mouth at bedtime. 60 tablet 0   sodium bicarbonate 650 MG tablet Take 2 tablets (1,300 mg total) by mouth 2 (two) times daily. 120 tablet 0   Vitamin D, Ergocalciferol, (DRISDOL) 1.25 MG (50000 UNIT) CAPS capsule Take 1 capsule (50,000 Units total) by mouth every 7 (seven) days for 6 doses. (Patient not taking: Reported on 05/20/2022) 6 capsule 0   No current facility-administered medications on file prior to visit.    Family History  Problem Relation Age of Onset   Diabetes Father    Heart disease Mother        NOT before age 90-  Bypass   Hypertension Mother    Hypertension Sister    Varicose Veins Brother    Heart disease Brother        Before age 75   Hypertension Daughter    Colon cancer Neg Hx    Esophageal cancer Neg Hx    Rectal cancer Neg Hx    Stomach cancer Neg Hx     Social History   Socioeconomic History   Marital status: Single    Spouse name: Not on file   Number of children: Not on file   Years of education: Not on file   Highest education level: Not on  file  Occupational History   Not on file  Tobacco Use   Smoking status: Light Smoker    Packs/day: 0.50    Years: 35.00    Total pack years: 17.50    Types: Cigarettes   Smokeless tobacco: Never   Tobacco comments:    patient is trying to quit and has gone down to half a pack per day 1 per day.  Vaping Use   Vaping Use: Never used  Substance and Sexual Activity   Alcohol use: Yes    Alcohol/week: 1.0 standard drink of alcohol    Types: 1 Standard drinks or equivalent per week    Comment: occasional use only   Drug use: No   Sexual activity: Not Currently  Other Topics Concern   Not on file  Social History Narrative   Not on file   Social Determinants of Health   Financial Resource Strain: Not on file  Food Insecurity: No Food Insecurity (07/28/2021)   Hunger Vital Sign    Worried About Running Out of Food in the Last Year: Never true    Ran Out of Food in the Last  Year: Never true  Transportation Needs: Unmet Transportation Needs (07/28/2021)   PRAPARE - Transportation    Lack of Transportation (Medical): Yes    Lack of Transportation (Non-Medical): Yes  Physical Activity: Inactive (07/28/2021)   Exercise Vital Sign    Days of Exercise per Week: 0 days    Minutes of Exercise per Session: 0 min  Stress: Not on file  Social Connections: Not on file  Intimate Partner Violence: Not on file    Review of Systems: ROS negative except for what is noted on the assessment and plan.  Objective:   Vitals:   06/16/22 1030 06/16/22 1037  BP: (!) 143/76 (!) 148/77  Pulse: 83 79  Resp: (!) 32   Temp: 99 F (37.2 C)   TempSrc: Oral   SpO2: 100%   Weight: 227 lb 6.4 oz (103.1 kg)   Height: '5\' 8"'$  (1.727 m)     Physical Exam: Constitutional: Chronically ill-appearing female, sitting in wheelchair, in no acute distress Neck: No JVD Cardiovascular: regular rate and rhythm, no m/r/g Pulmonary/Chest: normal work of breathing on room air, lungs clear to auscultation bilaterally Abdominal: soft, non-tender, non-distended MSK: normal bulk and tone, trace edema in lower extremities bilaterally, healing lesion of left breast with granulation tissue, no erythema present Neurological: alert & oriented x 3, cranial nerves II through XII intact, right beating nystagmus with left gaze, finger-nose testing without dysmetria, patient unable to complete heel-to-shin or Dix-Hallpike Skin: warm and dry   Assessment & Plan:  Stage 4 chronic kidney disease ([place]) Patient was hospitalized in June due to worsening of renal function.  Baseline creatinine was 2.8-3.2. At discharge her furosemide and spironolactone were held.  Creatinine was at 4.55 at discharge improved to 3.8 at ED visit on July 10.  Potassium was 5.3 at ED follow-up. She has not established with Dr. Joylene Grapes yet.  States that she has not found the time to call them, but plans to do so soon.  Reached out to Dr.  Joylene Grapes and he said that she did establish care with him about a year and a half ago but then refused to come back.  She was offered to see a different doctor but she continues to refuse appointments.  When she was hospitalized she was open to coming back to the practice.  They have called her several times  since hospitalization to reschedule and she has not answer the phone. On exam she does not have JVD, lungs are clear to auscultation bilaterally with normal effort breathing room air, and lower extremities have trace edema. A/P: Patient appears euvolemic currently. Repeat BMP showed improvement in creatinine from 3.88-2.97.  GFR remains less than 20.  The potassium has improved from 5.2-4.7.  CO2 from 23-21. I reiterated importance of establishing with nephrology.  She states that she has had a lot going on this month but would try and make the time to do so.   Hyperkalemia Potassium at 5.2 at ED visit on July 10.  Repeat BMP showed calcium at 4.7.  Cellulitis Patient presents for follow-up for left breast wound. She is unsure if she finished doxycycline course and she has been trying to keep the wound clean and dry.   On exam, there is no erythema or warmth surrounding lesion on left breast, granulation tissue present. A/P: Wound appears to be healing.  Patient instructed to continue keeping it clean and dry.  She was given return precautions if worsens to come back to clinic.  (HFpEF) heart failure with preserved ejection fraction ([place]) Patient's weight was at 202 pounds at clinic visit at the end of June.  She weighs 227 pounds today.  She denies orthopnea, PND, or peripheral edema.  She is not very ambulatory at baseline.  On exam she does not have JVD, lungs are clear to auscultation bilaterally with normal effort breathing room air, and lower extremities have trace edema.  A/P: Patient's weight is up by about 25 pounds in the last month.  I do think that her body habitus makes it difficult  to have an accurate volume assessment.  Repeat BMP shows improvement in renal function.  I talked with patient about needing to have the results of the labs back to make sure her kidneys were improving, but if they were I would likely restart Lasix to help manage her volume.  It is likely that the weight gain from fluid is multifactorial in the setting of HFpEF in worsening renal function.  She states that she is peeing multiple times throughout the day.  I will call and review results with patient to see if she is open to starting Lasix.  I asked her to return in 1 week and she states that she is unable to do so due to not having transportation.  I will check to see if her insurance offers any transportation resources or calling to review results with her.  Primary hypertension Medications include amlodipine 10 and metoprolol 100 she reports adherence with medications.  Blood pressure is elevated at 143/76 to 148/77.  Her weight is about 25 pounds up from June.  I think that her blood pressure is likely elevated in the setting of hypervolemia.  Her physical exam is difficult to assess volume status, but she does not have JVD, lungs are clear to auscultation, and trace edema in lower extremities bilaterally. A/P: Patient with stage IV CKD presenting with hypertension.  At this point I think that her elevated blood pressure is secondary to volume status.  Repeat BMP shows improvement in renal function.  At that visit I discussed with patient that if renal function was improved we would consider restarting Lasix.  She did not seem open to idea, I will call and reassess her thoughts on this.  Diabetes mellitus type 2 in obese (HCC) Hemoglobin A1c from 7.8 in May to 6.2. Review of medication fill  history shows that patient is likely not regularly taking Sitagliptin.  Likely that hemoglobin A1c is artificially low in the setting of normocytic anemia. A/P: Sitagliptin refills ordered Repeat HgbA1c Urine  microalbumin to creatinine ratio  Disequilibrium Patient presents with 3-year history of feeling off balance.  She states that it is worst when she gets up to move around.  She has to use a cane to help her to not fall.  She denies tinnitus or decreased hearing.  She has not noticed a worsening of her symptoms with turning head quickly.  She does have a prior history of stroke.  MRI was repeated earlier this year in January.  This showed chronic small vessel infarcts within the thalami and pons bilaterally, chronic small vessel ischemia within the cerebral white matter, chronic infarcts within the right cerebellar hemisphere.  On exam cranial nerves II through XII are intact, she does have right beating nystagmus with left gaze, finger-nose testing without dysmetria, patient unable to complete heel-to-shin.  Unable to complete Dix-Hallpike's maneuver. Assessment: High suspicion that symptoms are due to to remote stroke of cerebellum with the presence of her horizontal nystagmus.  She has previously attended physical therapy, I am unsure if this was vestibular rehab though.  Secondary differentials would include BPPV, however I am not able to do Dix-Hallpike with her mobility issues. Plan: Referral to vestibular rehab  Normocytic anemia Hemoglobin baseline around 10-12 over the last several months.  Repeat hemoglobin decreased from 11.5 on July 10 to 9.2.  Likely that anemia is from chronic renal disease.  She has not had iron panel recently or B12/folate labs.  Could consider this at follow-up visit to further evaluate chronic normocytic anemia. P: CBC   Patient discussed with Dr. Erroll Luna Madsen Riddle, D.O. [facility]  PGY-2 Pager: [phone]  Phone: [phone] Date 06/17/2022  Time 7:19 AM

## 2022-06-17 ENCOUNTER — Encounter: Payer: Self-pay | Admitting: Internal Medicine

## 2022-06-17 ENCOUNTER — Telehealth: Payer: Self-pay | Admitting: *Deleted

## 2022-06-17 DIAGNOSIS — D649 Anemia, unspecified: Secondary | ICD-10-CM | POA: Insufficient documentation

## 2022-06-17 DIAGNOSIS — E875 Hyperkalemia: Secondary | ICD-10-CM | POA: Insufficient documentation

## 2022-06-17 LAB — MICROALBUMIN / CREATININE URINE RATIO
Creatinine, Urine: 140.4 mg/dL
Microalb/Creat Ratio: 677 mg/g creat — ABNORMAL HIGH (ref 0–29)
Microalbumin, Urine: 949.9 ug/mL

## 2022-06-17 NOTE — Assessment & Plan Note (Signed)
Patient's weight was at 202 pounds at clinic visit at the end of June.  She weighs 227 pounds today.  She denies orthopnea, PND, or peripheral edema.  She is not very ambulatory at baseline.  On exam she does not have JVD, lungs are clear to auscultation bilaterally with normal effort breathing room air, and lower extremities have trace edema.  A/P: Patient's weight is up by about 25 pounds in the last month.  I do think that her body habitus makes it difficult to have an accurate volume assessment.  Repeat BMP shows improvement in renal function.  I talked with patient about needing to have the results of the labs back to make sure her kidneys were improving, but if they were I would likely restart Lasix to help manage her volume.  It is likely that the weight gain from fluid is multifactorial in the setting of HFpEF in worsening renal function.  She states that she is peeing multiple times throughout the day.  I will call and review results with patient to see if she is open to starting Lasix.  I asked her to return in 1 week and she states that she is unable to do so due to not having transportation.  I will check to see if her insurance offers any transportation resources or calling to review results with her.  Addendum 06/22/22: Called and talked with patient about transportation resources. She states that this does not help because she needs help getting in and out of her house. She is open to taking lasix, but is concerned that this lead to her kidneys getting worse. I talked with her about needing to follow-up in clinic next week.  -lasix 40 mg qd -social work  -will message front desk to have patient follow-up next week

## 2022-06-17 NOTE — Telephone Encounter (Signed)
Received call from Arnett with [facility], requesting VO to continue Roundup 1 week 5 for disease process education and to monitor skin breakdown on left breast. Also, requesting HH OT 1 week 4 to work on increasing independence with ADLs, endurance, strengthening, balance, and fall prevention. Verbal auth given. Will route to Yellow Team for agreement/denial.

## 2022-06-17 NOTE — Assessment & Plan Note (Signed)
Hemoglobin baseline around 10-12 over the last several months.  Repeat hemoglobin decreased from 11.5 on July 10 to 9.2.  Likely that anemia is from chronic renal disease.  She has not had iron panel recently or B12/folate labs.  Could consider this at follow-up visit to further evaluate chronic normocytic anemia. P: CBC

## 2022-06-17 NOTE — Assessment & Plan Note (Signed)
Medications include amlodipine 10 and metoprolol 100 she reports adherence with medications.  Blood pressure is elevated at 143/76 to 148/77.  Her weight is about 25 pounds up from June.  I think that her blood pressure is likely elevated in the setting of hypervolemia.  Her physical exam is difficult to assess volume status, but she does not have JVD, lungs are clear to auscultation, and trace edema in lower extremities bilaterally. A/P: Patient with stage IV CKD presenting with hypertension.  At this point I think that her elevated blood pressure is secondary to volume status.  Repeat BMP shows improvement in renal function.  At that visit I discussed with patient that if renal function was improved we would consider restarting Lasix.  She did not seem open to idea, I will call and reassess her thoughts on this.

## 2022-06-17 NOTE — Assessment & Plan Note (Signed)
Patient presents for follow-up for left breast wound. She is unsure if she finished doxycycline course and she has been trying to keep the wound clean and dry.   On exam, there is no erythema or warmth surrounding lesion on left breast, granulation tissue present. A/P: Wound appears to be healing.  Patient instructed to continue keeping it clean and dry.  She was given return precautions if worsens to come back to clinic.

## 2022-06-17 NOTE — Assessment & Plan Note (Signed)
Patient was hospitalized in June due to worsening of renal function.  Baseline creatinine was 2.8-3.2. At discharge her furosemide and spironolactone were held.  Creatinine was at 4.55 at discharge improved to 3.8 at ED visit on July 10.  Potassium was 5.3 at ED follow-up. She has not established with Dr. Joylene Grapes yet.  States that she has not found the time to call them, but plans to do so soon.  Reached out to Dr. Joylene Grapes and he said that she did establish care with him about a year and a half ago but then refused to come back.  She was offered to see a different doctor but she continues to refuse appointments.  When she was hospitalized she was open to coming back to the practice.  They have called her several times since hospitalization to reschedule and she has not answer the phone. On exam she does not have JVD, lungs are clear to auscultation bilaterally with normal effort breathing room air, and lower extremities have trace edema. A/P: Patient appears euvolemic currently. Repeat BMP showed improvement in creatinine from 3.88-2.97.  GFR remains less than 20.  The potassium has improved from 5.2-4.7.  CO2 from 23-21. I reiterated importance of establishing with nephrology.  She states that she has had a lot going on this month but would try and make the time to do so.

## 2022-06-17 NOTE — Assessment & Plan Note (Signed)
Hemoglobin A1c from 7.8 in May to 6.2. Review of medication fill history shows that patient is likely not regularly taking Sitagliptin.  Likely that hemoglobin A1c is artificially low in the setting of normocytic anemia. A/P: Sitagliptin refills ordered Repeat HgbA1c Urine microalbumin to creatinine ratio

## 2022-06-17 NOTE — Assessment & Plan Note (Signed)
Patient presents with 3-year history of feeling off balance.  She states that it is worst when she gets up to move around.  She has to use a cane to help her to not fall.  She denies tinnitus or decreased hearing.  She has not noticed a worsening of her symptoms with turning head quickly.  She does have a prior history of stroke.  MRI was repeated earlier this year in January.  This showed chronic small vessel infarcts within the thalami and pons bilaterally, chronic small vessel ischemia within the cerebral white matter, chronic infarcts within the right cerebellar hemisphere.  On exam cranial nerves II through XII are intact, she does have right beating nystagmus with left gaze, finger-nose testing without dysmetria, patient unable to complete heel-to-shin.  Unable to complete Dix-Hallpike's maneuver. Assessment: High suspicion that symptoms are due to to remote stroke of cerebellum with the presence of her horizontal nystagmus.  She has previously attended physical therapy, I am unsure if this was vestibular rehab though.  Secondary differentials would include BPPV, however I am not able to do Dix-Hallpike with her mobility issues. Plan: Referral to vestibular rehab

## 2022-06-17 NOTE — Assessment & Plan Note (Signed)
Potassium at 5.2 at ED visit on July 10.  Repeat BMP showed calcium at 4.7.

## 2022-06-20 ENCOUNTER — Telehealth: Payer: Self-pay | Admitting: *Deleted

## 2022-06-20 NOTE — Telephone Encounter (Signed)
Thanks for the update. I am glad she is following up with them.

## 2022-06-20 NOTE — Telephone Encounter (Signed)
Received call from Aaron Edelman, Publishing copy with [facility]. Stated pt was seen on Friday 7/28 and reported a 13 lbs weight gain sine 7/24. Stated he called our office Friday afternoon, he did not realized our office was closed. He called pt's nephrologist Dr Janan Halter at [facility] about this and asked pt to call and schedule an appt. Stated pt was educated on pulm edema and SHOB. He stated they will see pt today and he talked to her and she did not have any SHOB over the w/e.

## 2022-06-21 NOTE — Progress Notes (Signed)
Internal Medicine Clinic Attending  Case discussed with Dr. Masters  At the time of the visit.  We reviewed the resident's history and exam and pertinent patient test results.  I agree with the assessment, diagnosis, and plan of care documented in the resident's note.  

## 2022-06-22 ENCOUNTER — Telehealth: Payer: Self-pay

## 2022-06-22 MED ORDER — FUROSEMIDE 40 MG PO TABS: 40.0000 mg | ORAL_TABLET | Freq: Two times a day (BID) | ORAL | 11 refills | Status: DC

## 2022-06-22 NOTE — Addendum Note (Signed)
Addended by: Edwyna Perfect on: 06/22/2022 02:13 PM   Modules accepted: Orders

## 2022-06-22 NOTE — Telephone Encounter (Signed)
   Telephone encounter was:  Successful.  06/22/2022 Name: Alicia [name] MRN: [id number] DOB: Dec 28, 1950  Alicia [name] is a 71 y.o. year old female who is a primary care patient of Rick Duff, MD . The community resource team was consulted for assistance with Transportation Needs   Care guide performed the following interventions: Patient provided with information about care guide support team and interviewed to confirm resource needs. Patient refused and assistance or resources Follow Up Plan:  No further follow up planned at this time. The patient has been provided with needed resources.    [facility], Care Management  [phone] [address], [place], [place] [address] Phone: [phone] Email: [email or web address]

## 2022-06-28 ENCOUNTER — Other Ambulatory Visit (HOSPITAL_COMMUNITY): Payer: Self-pay

## 2022-06-29 ENCOUNTER — Telehealth: Payer: Self-pay | Admitting: *Deleted

## 2022-06-29 NOTE — Telephone Encounter (Signed)
Call from [facility] with [place]. Stated she thinks pt is in active HF. Stated in June pt weighed 206 lbs and today she's 225.4 lbs. Notes crackles in lung bases. Pt's hands, face,legs, and arms swollen. O2 sat 92%. BP- 148/74. And  nurse stated Lasix was started on Friday. Nurse stated pt is shob at rest and very shob when she stood up to be weighed. Pt has O2 but uses it mainly at night.  LOV was 06/16/22.

## 2022-06-29 NOTE — Telephone Encounter (Signed)
Called pt - she stated she does not feel well; stated she is shob. Informed pt I had talked with The Attending, she needs to to go to the ED for evaluation. [facility] is still in the home made aware. Pt agreed to go to the ED -stated she needs to find someone to bring her; instructed pt to call EMS if she's unable.

## 2022-06-29 NOTE — Telephone Encounter (Signed)
Given her worsening SOB, weight gain and no improvement on lasix I would advise her to go to the ED ASAP for evaluation and possible admission

## 2022-06-30 ENCOUNTER — Encounter (HOSPITAL_COMMUNITY): Payer: Self-pay | Admitting: Emergency Medicine

## 2022-06-30 ENCOUNTER — Other Ambulatory Visit: Payer: Self-pay

## 2022-06-30 ENCOUNTER — Observation Stay (HOSPITAL_COMMUNITY)
Admission: EM | Admit: 2022-06-30 | Discharge: 2022-07-01 | Disposition: A | Discharge status: Home Health | Payer: Medicare HMO | Attending: Internal Medicine | Admitting: Internal Medicine

## 2022-06-30 ENCOUNTER — Emergency Department (HOSPITAL_COMMUNITY): Payer: Medicare HMO

## 2022-06-30 DIAGNOSIS — R609 Edema, unspecified: Secondary | ICD-10-CM | POA: Diagnosis not present

## 2022-06-30 DIAGNOSIS — N184 Chronic kidney disease, stage 4 (severe): Secondary | ICD-10-CM | POA: Diagnosis not present

## 2022-06-30 DIAGNOSIS — Z602 Problems related to living alone: Secondary | ICD-10-CM | POA: Insufficient documentation

## 2022-06-30 DIAGNOSIS — Z8673 Personal history of transient ischemic attack (TIA), and cerebral infarction without residual deficits: Secondary | ICD-10-CM | POA: Diagnosis not present

## 2022-06-30 DIAGNOSIS — Z20822 Contact with and (suspected) exposure to covid-19: Secondary | ICD-10-CM | POA: Insufficient documentation

## 2022-06-30 DIAGNOSIS — I5023 Acute on chronic systolic (congestive) heart failure: Secondary | ICD-10-CM | POA: Diagnosis not present

## 2022-06-30 DIAGNOSIS — R0602 Shortness of breath: Secondary | ICD-10-CM | POA: Diagnosis present

## 2022-06-30 DIAGNOSIS — R2681 Unsteadiness on feet: Secondary | ICD-10-CM | POA: Diagnosis not present

## 2022-06-30 DIAGNOSIS — R2689 Other abnormalities of gait and mobility: Secondary | ICD-10-CM | POA: Diagnosis not present

## 2022-06-30 DIAGNOSIS — J811 Chronic pulmonary edema: Secondary | ICD-10-CM | POA: Diagnosis not present

## 2022-06-30 DIAGNOSIS — I509 Heart failure, unspecified: Secondary | ICD-10-CM

## 2022-06-30 DIAGNOSIS — I959 Hypotension, unspecified: Secondary | ICD-10-CM | POA: Diagnosis not present

## 2022-06-30 DIAGNOSIS — G8929 Other chronic pain: Secondary | ICD-10-CM | POA: Insufficient documentation

## 2022-06-30 DIAGNOSIS — E870 Hyperosmolality and hypernatremia: Secondary | ICD-10-CM | POA: Diagnosis not present

## 2022-06-30 DIAGNOSIS — Z7984 Long term (current) use of oral hypoglycemic drugs: Secondary | ICD-10-CM | POA: Diagnosis not present

## 2022-06-30 DIAGNOSIS — Z91040 Latex allergy status: Secondary | ICD-10-CM | POA: Diagnosis not present

## 2022-06-30 DIAGNOSIS — E1122 Type 2 diabetes mellitus with diabetic chronic kidney disease: Secondary | ICD-10-CM | POA: Diagnosis not present

## 2022-06-30 DIAGNOSIS — Z95820 Peripheral vascular angioplasty status with implants and grafts: Secondary | ICD-10-CM | POA: Diagnosis not present

## 2022-06-30 DIAGNOSIS — Z7409 Other reduced mobility: Secondary | ICD-10-CM | POA: Diagnosis not present

## 2022-06-30 DIAGNOSIS — I1310 Hypertensive heart and chronic kidney disease without heart failure, with stage 1 through stage 4 chronic kidney disease, or unspecified chronic kidney disease: Secondary | ICD-10-CM | POA: Insufficient documentation

## 2022-06-30 DIAGNOSIS — Z79899 Other long term (current) drug therapy: Secondary | ICD-10-CM | POA: Diagnosis not present

## 2022-06-30 DIAGNOSIS — I5032 Chronic diastolic (congestive) heart failure: Secondary | ICD-10-CM

## 2022-06-30 DIAGNOSIS — M6281 Muscle weakness (generalized): Secondary | ICD-10-CM | POA: Insufficient documentation

## 2022-06-30 DIAGNOSIS — I5033 Acute on chronic diastolic (congestive) heart failure: Secondary | ICD-10-CM | POA: Diagnosis not present

## 2022-06-30 DIAGNOSIS — R531 Weakness: Secondary | ICD-10-CM | POA: Diagnosis not present

## 2022-06-30 DIAGNOSIS — I110 Hypertensive heart disease with heart failure: Secondary | ICD-10-CM | POA: Diagnosis not present

## 2022-06-30 LAB — BASIC METABOLIC PANEL
Anion gap: 5 (ref 5–15)
BUN: 27 mg/dL — ABNORMAL HIGH (ref 8–23)
CO2: 26 mmol/L (ref 22–32)
Calcium: 8.1 mg/dL — ABNORMAL LOW (ref 8.9–10.3)
Chloride: 121 mmol/L — ABNORMAL HIGH (ref 98–111)
Creatinine, Ser: 2.95 mg/dL — ABNORMAL HIGH (ref 0.44–1.00)
GFR, Estimated: 16 mL/min — ABNORMAL LOW (ref >60)
Glucose, Bld: 119 mg/dL — ABNORMAL HIGH (ref 70–99)
Potassium: 3.9 mmol/L (ref 3.5–5.1)
Sodium: 152 mmol/L — ABNORMAL HIGH (ref 135–145)

## 2022-06-30 LAB — CBC
HCT: 29.7 % — ABNORMAL LOW (ref 36.0–46.0)
Hemoglobin: 9 g/dL — ABNORMAL LOW (ref 12.0–15.0)
MCH: 29.9 pg (ref 26.0–34.0)
MCHC: 30.3 g/dL (ref 30.0–36.0)
MCV: 98.7 fL (ref 80.0–100.0)
Platelets: 469 10*3/uL — ABNORMAL HIGH (ref 150–400)
RBC: 3.01 MIL/uL — ABNORMAL LOW (ref 3.87–5.11)
RDW: 17.1 % — ABNORMAL HIGH (ref 11.5–15.5)
WBC: 7.8 10*3/uL (ref 4.0–10.5)
nRBC: 0 % (ref 0.0–0.2)

## 2022-06-30 LAB — BRAIN NATRIURETIC PEPTIDE: B Natriuretic Peptide: 698.5 pg/mL — ABNORMAL HIGH (ref 0.0–100.0)

## 2022-06-30 LAB — GLUCOSE, CAPILLARY
Glucose-Capillary: 134 mg/dL — ABNORMAL HIGH (ref 70–99)
Glucose-Capillary: 104 mg/dL — ABNORMAL HIGH (ref 70–99)

## 2022-06-30 LAB — SARS CORONAVIRUS 2 BY RT PCR: SARS Coronavirus 2 by RT PCR: NEGATIVE

## 2022-06-30 MED ORDER — DICLOFENAC SODIUM 1 % EX GEL
4.0000 g | Freq: Four times a day (QID) | CUTANEOUS | Status: DC
  Administered 2022-06-30 – 2022-07-01 (×3): 4 g via TOPICAL
  Filled 2022-06-30: qty 100

## 2022-06-30 MED ORDER — ACETAMINOPHEN 500 MG PO TABS
1000.0000 mg | ORAL_TABLET | Freq: Four times a day (QID) | ORAL | Status: DC | PRN
  Administered 2022-06-30: 1000 mg via ORAL
  Filled 2022-06-30: qty 2

## 2022-06-30 MED ORDER — AMLODIPINE BESYLATE 10 MG PO TABS
10.0000 mg | ORAL_TABLET | Freq: Every day | ORAL | Status: DC
  Administered 2022-06-30 – 2022-07-01 (×2): 10 mg via ORAL
  Filled 2022-06-30 (×3): qty 1

## 2022-06-30 MED ORDER — INSULIN ASPART 100 UNIT/ML IJ SOLN
0.0000 [IU] | Freq: Three times a day (TID) | INTRAMUSCULAR | Status: DC
  Administered 2022-07-01: 3 [IU] via SUBCUTANEOUS

## 2022-06-30 MED ORDER — FAMOTIDINE 20 MG PO TABS
20.0000 mg | ORAL_TABLET | Freq: Every day | ORAL | Status: DC
  Administered 2022-06-30 – 2022-07-01 (×2): 20 mg via ORAL
  Filled 2022-06-30 (×2): qty 1

## 2022-06-30 MED ORDER — FUROSEMIDE 10 MG/ML IJ SOLN
40.0000 mg | Freq: Once | INTRAMUSCULAR | Status: DC
  Filled 2022-06-30: qty 4

## 2022-06-30 MED ORDER — ACETAMINOPHEN 650 MG RE SUPP: 650.0000 mg | Freq: Four times a day (QID) | RECTAL | Status: DC | PRN

## 2022-06-30 MED ORDER — INSULIN ASPART 100 UNIT/ML IJ SOLN
0.0000 [IU] | INTRAMUSCULAR | Status: DC
  Administered 2022-06-30: 3 [IU] via SUBCUTANEOUS

## 2022-06-30 MED ORDER — ALBUTEROL SULFATE (2.5 MG/3ML) 0.083% IN NEBU: 3.0000 mL | INHALATION_SOLUTION | Freq: Four times a day (QID) | RESPIRATORY_TRACT | Status: DC | PRN

## 2022-06-30 MED ORDER — ACETAMINOPHEN 325 MG PO TABS: 650.0000 mg | ORAL_TABLET | Freq: Four times a day (QID) | ORAL | Status: DC | PRN

## 2022-06-30 MED ORDER — METOPROLOL SUCCINATE ER 100 MG PO TB24
100.0000 mg | ORAL_TABLET | Freq: Every day | ORAL | Status: DC
Start: 2022-06-30 — End: 2022-07-02
  Administered 2022-06-30 – 2022-07-01 (×2): 100 mg via ORAL
  Filled 2022-06-30 (×2): qty 1

## 2022-06-30 MED ORDER — ROSUVASTATIN CALCIUM 5 MG PO TABS
10.0000 mg | ORAL_TABLET | Freq: Every evening | ORAL | Status: DC
  Administered 2022-06-30: 10 mg via ORAL
  Filled 2022-06-30: qty 2

## 2022-06-30 MED ORDER — ENOXAPARIN SODIUM 30 MG/0.3ML IJ SOSY
30.0000 mg | PREFILLED_SYRINGE | INTRAMUSCULAR | Status: DC
  Administered 2022-06-30 – 2022-07-01 (×2): 30 mg via SUBCUTANEOUS
  Filled 2022-06-30 (×2): qty 0.3

## 2022-06-30 NOTE — Progress Notes (Signed)
Patient refused use of CPAP for the evening. RT will continue to monitor.  

## 2022-06-30 NOTE — ED Provider Notes (Signed)
[facility] EMERGENCY DEPARTMENT Provider Note   CSN: [id number] Arrival date & time: 06/30/22  5093     History  Chief Complaint  Patient presents with   Shortness of Breath    HAZELINE CHARNLEY is a 71 y.o. female.  HPI 71 year old female with a history of CHF with an EF of 5 to 60% in April of this year, history of DM type II, hypertension, constipation, stroke, hyperlipidemia, GERD presents to the ER with complaints of shortness of breath and overall weakness over the last several days.  Patient states that she has felt unwell for quite some time, but has felt even worse in the last few days.  She reports severe shortness of breath with even the slightest ambulation.  She was recently started back on Lasix on Friday, states that she used to take it but this was discontinued due to her acute on chronic AKI.  She states that she does not think this medication is working.  She denies any cough, fevers or chills.  Denies any dysuria, abdominal pain, hematuria or chest pain.    Home Medications Prior to Admission medications   Medication Sig Start Date End Date Taking? Authorizing Provider  acetaminophen (TYLENOL) 500 MG tablet Take 2 tablets (1,000 mg total) by mouth every 6 (six) hours. 05/25/22   Romana Juniper, MD  albuterol (VENTOLIN HFA) 108 (90 Base) MCG/ACT inhaler Inhale 2 puffs into the lungs every 6 (six) hours as needed for wheezing or shortness of breath. 12/10/21   Rick Duff, MD  amLODipine (NORVASC) 10 MG tablet Take 1 tablet (10 mg total) by mouth daily. 05/19/22 05/19/23  Lacinda Axon, MD  diclofenac Sodium (VOLTAREN) 1 % GEL Apply 4 g topically 4 (four) times daily. Apply to both knees. 05/25/22   Romana Juniper, MD  furosemide (LASIX) 40 MG tablet Take 1 tablet (40 mg total) by mouth 2 (two) times daily. 06/22/22 06/22/23  Masters, Katie, DO  metoprolol succinate (TOPROL-XL) 100 MG 24 hr tablet Take 1 tablet (100 mg total) by  mouth daily. Take with or immediately following a meal. Patient taking differently: Take 100 mg by mouth every evening. Take with or immediately following a meal. 05/19/22   Amponsah, Charisse March, MD  OXYGEN Inhale 2 L into the lungs daily as needed (oxygen).    [provider]  pantoprazole (PROTONIX) 40 MG tablet Take 1 tablet (40 mg total) by mouth 2 (two) times daily for 14 days. 05/30/22 06/13/22  Sherwood Gambler, MD  rosuvastatin (CRESTOR) 10 MG tablet Take 1 tablet (10 mg total) by mouth every evening. 05/19/22   Lacinda Axon, MD  sitaGLIPtin (JANUVIA) 25 MG tablet Take 1 tablet (25 mg total) by mouth daily. 06/16/22   Masters, Joellen Jersey, DO      Allergies    Nifedipine er, Atorvastatin, and Latex    Review of Systems   Review of Systems Ten systems reviewed and are negative for acute change, except as noted in the HPI.   Physical Exam Updated Vital Signs BP (!) 129/107 (BP Location: Right Arm)   Pulse 65   Temp 97.6 F (36.4 C) (Oral)   Resp 14   SpO2 100%  Physical Exam Vitals and nursing note reviewed.  Constitutional:      General: She is not in acute distress.    Appearance: She is well-developed.  HENT:     Head: Normocephalic and atraumatic.  Eyes:     Conjunctiva/sclera: Conjunctivae normal.  Neck:  Vascular: No JVD.  Cardiovascular:     Rate and Rhythm: Normal rate and regular rhythm.     Heart sounds: No murmur heard. Pulmonary:     Effort: Pulmonary effort is normal. No respiratory distress.     Breath sounds: Decreased breath sounds present. No wheezing, rhonchi or rales.     Comments: Mild expiratory wheeze when speaking, speaking in short sentences Chest:     Chest wall: No tenderness.  Abdominal:     Palpations: Abdomen is soft.     Tenderness: There is no abdominal tenderness.  Musculoskeletal:        General: No swelling.     Cervical back: Neck supple.     Right lower leg: Edema present.     Left lower leg: Edema present.      Comments: Trace edema to bilateral lower extremities  Skin:    General: Skin is warm and dry.     Capillary Refill: Capillary refill takes less than 2 seconds.  Neurological:     General: No focal deficit present.     Mental Status: She is alert.  Psychiatric:        Mood and Affect: Mood normal.     ED Results / Procedures / Treatments   Labs (all labs ordered are listed, but only abnormal results are displayed) Labs Reviewed  BASIC METABOLIC PANEL - Abnormal; Notable for the following components:      Result Value   Sodium 152 (*)    Chloride 121 (*)    Glucose, Bld 119 (*)    BUN 27 (*)    Creatinine, Ser 2.95 (*)    Calcium 8.1 (*)    GFR, Estimated 16 (*)    All other components within normal limits  CBC - Abnormal; Notable for the following components:   RBC 3.01 (*)    Hemoglobin 9.0 (*)    HCT 29.7 (*)    RDW 17.1 (*)    Platelets 469 (*)    All other components within normal limits  BRAIN NATRIURETIC PEPTIDE - Abnormal; Notable for the following components:   B Natriuretic Peptide 698.5 (*)    All other components within normal limits    EKG EKG Interpretation  Date/Time:  Thursday June 30 2022 09:25:45 EDT Ventricular Rate:  69 PR Interval:  124 QRS Duration: 100 QT Interval:  468 QTC Calculation: 501 R Axis:   -12 Text Interpretation: Normal sinus rhythm Left ventricular hypertrophy with repolarization abnormality ( R in aVL , Cornell product ) Prolonged QT overall similar to July 2023 Confirmed by Sherwood Gambler [id number]) on 06/30/2022 2:12:47 PM  Radiology DG Chest 2 View  Result Date: 06/30/2022 CLINICAL DATA:  shob EXAM: CHEST - 2 VIEW COMPARISON:  Chest x-ray 05/30/2022. FINDINGS: Enlarged cardiac silhouette. Pulmonary vascular congestion. No overt pulmonary edema. No confluent consolidation. No visible pleural effusions or pneumothorax. Polyarticular degenerative change including severe right shoulder degenerative change and chronic loose bodies.  IMPRESSION: Cardiomegaly and pulmonary vascular congestion without overt pulmonary edema. Electronically Signed   By: Margaretha Sheffield M.D.   On: 06/30/2022 09:54    Procedures Procedures    Medications Ordered in ED Medications  furosemide (LASIX) injection 40 mg (has no administration in time range)    ED Course/ Medical Decision Making/ A&P                           Medical Decision Making Amount and/or Complexity of Data Reviewed  Labs: ordered. Radiology: ordered.   71 year old female presenting with shortness of breath and fatigue.  On arrival, she is a chronically ill-appearing woman however in no acute distress, resting comfortably in the ER bed.  She is speaking in shorter sentences and has some expiratory wheezing, however her oxygen saturations remain above 95%.  She has some trace edema to her lower extremities, but does not appear to be grossly fluid overloaded.  She is fairly dyspneic when speaking though.  Differential diagnosis includes CHF exacerbation, pneumonia, PE, new onset COPD (unlikely)  Labs ordered triage, reviewed and interpreted by me.  CBC with a stable hemoglobin of 9, platelets of 469.  BMP with fairly significant hypernatremia 152, creatinine appears to be at baseline 2.95, BUN of 27, chloride of 121.  Potassium of 3.9.  Her BNP is 698.5 today, which is elevated from her prior.  Chest x-ray ordered in triage, reviewed by me, cardiomegaly and pulmonary vascular congestion without overt pulmonary edema.  EKG ordered, nonischemic.   Overall, the patient appears to be symptomatic with in regards to her shortness of breath.  Low suspicion for PE given no tachycardia, hypoxia, CP, or pleuritic symptoms. Nursing staff attempted to ambulate her and she was not able to stand up without being extremely short of breath.  With this, and also in the setting of significant hypernatremia (suspect 2/2 free water deficit) I think she would benefit from admission for diuresis  and optimization of her medications. Consulted internal medicine teaching service who will admit her for further evaluation and treatment   You were evaluated in the Emergency Department and after careful evaluation, we did not find any emergent condition requiring admission or further testing in the hospital.    Please return to the Emergency Department if you experience any worsening of your condition.  We encourage you to follow up with a primary care provider.  Thank you for allowing [place] to be a part of your care.    Final Clinical Impression(s) / ED Diagnoses Final diagnoses:  Acute on chronic congestive heart failure, unspecified heart failure type (Cut Off)  Hypernatremia    Rx / DC Orders ED Discharge Orders     None         Garald Balding, PA-C 06/30/22 1513    Sherwood Gambler, MD 06/30/22 1536

## 2022-06-30 NOTE — ED Notes (Signed)
Medication delay due to access issue

## 2022-06-30 NOTE — Hospital Course (Addendum)
At time of hospital follow-up visit, please address the following: - Please ask patient if she has been able to complete the PACE application, and if not, what trouble she is having with it - Monitor diet and fluid intake - Monitor volume status - Consider autoimmune workup to evaluate for other potential etiologies of chronic pain (ex. lupus) - Continue to monitor L breast lesion from prior cellulitis - Nephrology follow up  -------------------------------  Alicia [name] is a 71 y.o. female with PMHx of HFpEF, T2DM, CKD stage IV, and prior CVA who presented to [facility] ED on 06/30/2022 with worsening shortness of breath c/f CHF exacerbation and was also found to be hypernatremic.   Problem-based hospital course:  Hypernatremia Na+ was 152 on presentation. Per review of past labs, patient's Na+ has fluctuated in the past but she has never been this hypernatremic. Last Na+ on 7/27 was 4.7. Of note, prior hyperaldosteronism workup was unremarkable.   Per review of her charts, patient was recently admitted 6/30 - 7/5 for AKI on CKD. Her home diuretics (lasix and spironolactone) were held with plans to reassess and potentially restart at hospital follow up. After an ED visit for generalized weakness on 7/10, she was seen by the IM clinic on 7/27 and appeared euvolemic but was also noted to have gained ~25 lbs in the last month. Repeat BMP showed improvement in renal function. She was instructed to follow up with nephrology. It appears that she was later restarted on lasix 40 mg BID on 8/2 per medication order records.   Patient states she has been taking lasix but only once daily because she is worried about her kidneys. Her dietary habits are also inconsistent and has not been drinking much water. It is difficult to assess volume status given her body habitus but she appeared euvolemic on exam. It is therefore possible that her hypernatremia was caused by a combination of poor fluid intake  and resuming lasix usage. Her underlying CKD may have also contributed. Calculated free water deficit was 4.4 L. She was not given any lasix or fluid boluses in the hospital and her Na+ improved to 138 the next day. Patient noted she was eating and drinking more while in the hospital compared to at home. At discharge, patient was restarted on lasix and was instructed on the importance of maintaining good PO intake. She will see the IM clinic next week for close follow up.    H/o HFpEF  Likely NYHA class III/ stage C. During her clinic visit on 7/27, patient was reported to have gained ~25 lbs over the past few months. She does report worsening dyspnea over the past few days and she had restarted lasix as noted above but without good UOP. However, she appears generally euvolemic on exam and she is at 100% SpO2 on RA. Her current dyspnea is likely in the setting of deconditioning and limited mobility from her chronic illnesses. We will resume lasix at discharge and continue to monitor as outpatient.    HTN Patient's records show her spironolactone was held at the time of her hospital discharge on 7/5 and was not restarted. She is still on amlodipine and metoprolol at home. Her BPs were notably elevated in the ED up to the 829'F systolic. Her home amlodipine and metoprolol were restarted on admission and her BPs. Subsequently improved We will continue these medications at discharge and also keep holding the spironolactone. Of note, her CKD prevents her from being on ACEi's/ ARBs or  higher doses of diuretics.    CKD stage IV Patient has been seen by [facility] in the past but per her charts, she has missed several appointments. Her renal disease is likely from uncontrolled HTN in the setting of medical non-adherence. Cr at admission was 2.95 which is at her baseline. She notes she has an appointment with [facility] soon and she has been advised to attend this appointment.    H/o T2DM On sitagliptin  at home though unclear if she has been adherent given her refill history. She was put on SSI in the hospital and will resume home sitagliptin at discharge.   Social determinants of health Patient does not have good support in the area and has difficulty making appointments which has led to progression of her chronic medical conditions. Her daughter lives in [place]. During this hospital stay, we introduced the PACE program to her and she seems very amenable to trying the program. Social work has provided her with more information. We also discussed that moving forward, she may need to move closer to family to help her make appointments and manage her care.   PHQ-9 (07/01/2022) 1. Little interest or pleasure in doing things = 3 2. Feeling down, depressed, or hopeless = 1 3. Trouble falling or staying asleep, or sleeping too much = 1 4. Feeling tired or having little energy = 3 5. Poor appetite or overeating = 2 6. Feeling bad about yourself -- or that you are a failure or have let yourself or your family down = 0 7. Trouble concentrating on things, such as reading the newspaper or watching television = 0 8. Moving or speaking so slowly that other people could have noticed? Or the opposite -- being so fidgety or restless that you have been moving around a lot more than usual = 0 9. Thoughts that you would be better off dead or of hurting yourself in some way = 0 TOTAL = 10 How difficult have these problems made it for you to do your work, take care of things at home, or get along with other people? = Extremely difficult  -------------------------------  Subjective NAEON, VSS. Patient reports feeling somewhat better compared to yesterday. She also has somewhat better PO intake of both solids and liquids. She continues to have difficulty with exertion due to chronic pain/ mobility issues and some shortness of breath, but she seems to be at her baseline. No N/V, no chest pain or abdominal pain.    Objective BP (!) 165/69   Pulse 67   Temp (!) 97.4 F (36.3 C) (Oral)   Resp 20   Ht '5\' 8"'$  (1.727 m)   Wt 101.4 kg   SpO2 93%   BMI 33.99 kg/m    Constitutional: Chronically ill appearing. No acute distress. Converses and answers questions appropriately. Eyes: Sclera anicteric. EOM grossly intact.  HENT: Normocephalic and atraumatic. Mucous membranes are dry. OP w/o erythema or exudate. Neck: JVD to mid neck. No cervical lymphadenopathy or thyromegaly.  Cardiovascular: RRR. No M/R/G. Respiratory: Slightly decreased breath sounds bilaterally. Mild expiratory wheeze. No use of accessory muscles. Gastrointestinal: Soft. NT/ND.  Musculoskeletal: BLE with trace edema and cool to touch, chronic. +2 pulses in BLE and BUE. Skin is generally dry and flaky. Old lesion under L breast from prior cellulitis, well-healed, no warmth/ erythema/ discharge.  Neurologic: Normal speech and language. No gross focal neurologic deficits are appreciated.  Psychiatric: Normal mood and affect. Normal speech and behavior.

## 2022-06-30 NOTE — ED Notes (Signed)
This RN attempted PIV x2 and was unsuccessful. Will place IV team consult for patient at this time.

## 2022-06-30 NOTE — ED Provider Triage Note (Signed)
Emergency Medicine Provider Triage Evaluation Note  Alicia [name] , a 71 y.o. female  was evaluated in triage.  Pt complains of shortness of breath, global swelling, swollen joints.  Patient unable to tell me when these issues began.  Patient states that they got worse in the last couple days.  The patient states that she is having swelling of her hands, wrists, elbows.  She also complains of pain in these areas.  The patient states she has remote history of this occurring in the past however cannot give me more details.  The patient denies any history of rheumatological issues.  Patient states that she has CHF, was supposed to be taking fluid pill however did not want to.  Patient denies any fevers, nausea, vomiting, chest pain.  Review of Systems  Positive:  Negative:   Physical Exam  BP (!) 169/79 (BP Location: Right Arm)   Pulse 71   Temp 99 F (37.2 C) (Oral)   Resp 16   SpO2 98%  Gen:   Awake, no distress   Resp:  Normal effort  MSK:   Moves extremities without difficulty  Other:    Medical Decision Making  Medically screening exam initiated at 9:37 AM.  Appropriate orders placed.  ZARA WENDT was informed that the remainder of the evaluation will be completed by another provider, this initial triage assessment does not replace that evaluation, and the importance of remaining in the ED until their evaluation is complete.     Azucena Cecil, [place] 06/30/22 [phone]

## 2022-06-30 NOTE — ED Notes (Signed)
MD notified of hypertension. Plan to start home medications for BP management.

## 2022-06-30 NOTE — ED Triage Notes (Signed)
BIB EMS from home.  Global swelling. Hx of CHF.  Has seen physician lately and was given lasix with minimal results.  PRN O2 at home . Ambulates with walker.  Exertional dyspnea.  Placed on 2L [place] by EMS for comfort.  VSS

## 2022-06-30 NOTE — H&P (Addendum)
Date: 06/30/2022          [name] MRN: [id number]  DOB: [birth year] Age, sex: 71 y.o., female   PCP: Rick Duff, MD         Medical service: Internal Medicine Teaching Service    Attending physician: Gilles Chiquito, MD     First contact: Lunette Stands, MS4 Pager: [phone]  Second contact: Sanjuana Letters, DO Pager: [phone]       After hours (after 5 PM,  First Contact Pager: [phone]  weekends, holidays): Second Contact Pager: [phone]   SUBJECTIVE  Chief complaint Shortness of breath  History of present illness Alicia [name] is a 71 y.o. female with PMHx of HFpEF, T2DM, CKD stage IV, and prior CVA who presented to [facility] ED with shortness of breath. Patient reports that she has ongoing shortness of breath which has been attributed to her CHF, but it has worsened over the past few days. Her home health nurse became concerned about her breathing and informed the IM clinic yesterday. She was advised to be seen in the ED so she called EMS today. Patient notes has chronic body aches and arthritic pain for which she takes tylenol daily but her pain has seemed to worsen over the past few days as well. She also reports a sore throat which comes and goes but is somewhat worse today. She has chronic diarrhea. She is not urinating much recently. Her appetite is good some days and poor other days. No fevers, chills, congestion, chest pain, or N/V. She no longer takes naproxen.   Per review of her charts, patient was recently admitted 6/30 - 7/5 for AKI on CKD. Her home diuretics (lasix and spironolactone) were held with plans to reassess and potentially restart at hospital follow up. After an ED visit for generalized weakness on 7/10, she was seen by the IM clinic on 7/27 and appeared euvolemic but was also noted to have gained ~25 lbs in the last month. Repeat BMP showed improvement in renal function. She was instructed to follow up with nephrology. It  appears that she was later restarted on lasix 40 mg BID on 8/2 per medication order records.   However, patient states she has been taking lasix only once daily because she is worried about her kidneys. She has not been able to make appointments because of transportation issues (the transportation company does not help her get from her house to the vehicle). She has not been leaving her apartment much for the past few years because of her chronic symptoms. She uses a walker at baseline. She has O2 and an inhaler at home which she only uses occasionally.   Healthcare decision maker KATHRINE RIEVES currently has decisional capacity for healthcare decision-making and is able to designate a surrogate healthcare decision maker. Alicia [name]'s designated healthcare decision maker(s) is/are Alicia [name], the patient's daughter, as denoted by patient preference.  Allergies Nifedipine er, Atorvastatin, and Latex  Medications Patient reports taking lasix 40 mg qd and tylenol 1000 mg q8hr. She is unsure what other medications she is taking. Below is a list of her prescribed meds.   Prior to Admission medications   Medication Sig Start Date End Date Taking? Authorizing Provider  acetaminophen (TYLENOL) 500 MG tablet Take 2 tablets (1,000 mg total) by mouth every 6 (six) hours. 05/25/22  Yes Romana Juniper, MD  albuterol (VENTOLIN HFA) 108 (90 Base) MCG/ACT inhaler Inhale 2 puffs into the lungs every 6 (six) hours  as needed for wheezing or shortness of breath. 12/10/21  Yes Rick Duff, MD  amLODipine (NORVASC) 10 MG tablet Take 1 tablet (10 mg total) by mouth daily. 05/19/22 05/19/23 Yes Lacinda Axon, MD  diclofenac Sodium (VOLTAREN) 1 % GEL Apply 4 g topically 4 (four) times daily. Apply to both knees. 05/25/22  Yes Romana Juniper, MD  furosemide (LASIX) 40 MG tablet Take 1 tablet (40 mg total) by mouth 2 (two) times daily. Patient taking differently: Take 40 mg by  mouth daily. 06/22/22 06/22/23 Yes Masters, Katie, DO  metoprolol succinate (TOPROL-XL) 100 MG 24 hr tablet Take 1 tablet (100 mg total) by mouth daily. Take with or immediately following a meal. Patient taking differently: Take 100 mg by mouth every evening. Take with or immediately following a meal. 05/19/22  Yes Amponsah, Charisse March, MD  OXYGEN Inhale 2 L into the lungs daily as needed (oxygen).   Yes [provider]  pantoprazole (PROTONIX) 40 MG tablet Take 1 tablet (40 mg total) by mouth 2 (two) times daily for 14 days. 05/30/22 06/13/22  Sherwood Gambler, MD  rosuvastatin (CRESTOR) 10 MG tablet Take 1 tablet (10 mg total) by mouth every evening. 05/19/22   Lacinda Axon, MD  sitaGLIPtin (JANUVIA) 25 MG tablet Take 1 tablet (25 mg total) by mouth daily. 06/16/22   Masters, Joellen Jersey, DO   Medical history Past Medical History:  Diagnosis Date   Allergy    Arthritis    Chronic headaches    Constipation    Depression    Diabetes mellitus    GERD (gastroesophageal reflux disease)    Hyperlipidemia    Hypertension    Obesity    PAD (peripheral artery disease) ([place])    stage 4    Stroke [facility])    Surgical history Past Surgical History:  Procedure Laterality Date   ABDOMINAL AORTOGRAM W/LOWER EXTREMITY N/A 12/17/2018   Procedure: ABDOMINAL AORTOGRAM W/LOWER EXTREMITY;  Surgeon: Waynetta Sandy, MD;  Location: [place] [facility];  Service: Cardiovascular;  Laterality: N/A;   ARTERY BIOPSY Left 03/17/2022   Procedure: BIOPSY TEMPORAL ARTERY;  Surgeon: Cherre Robins, MD;  Location: [place];  Service: Vascular;  Laterality: Left;   FEMORAL BYPASS  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 Bridgett Larsson   PERIPHERAL VASCULAR ATHERECTOMY Left 12/17/2018   Procedure: PERIPHERAL VASCULAR ATHERECTOMY;  Surgeon: Waynetta Sandy, MD;  Location: [place] [facility];  Service: Cardiovascular;  Laterality: Left;   PERIPHERAL VASCULAR BALLOON ANGIOPLASTY Left  12/17/2018   Procedure: PERIPHERAL VASCULAR BALLOON ANGIOPLASTY;  Surgeon: Waynetta Sandy, MD;  Location: [place] [facility];  Service: Cardiovascular;  Laterality: Left;   Family history Family History  Problem Relation Age of Onset   Diabetes Father    Heart disease Mother        NOT before age 89-  Bypass   Hypertension Mother    Hypertension Sister    Varicose Veins Brother    Heart disease Brother        Before age 6   Hypertension Daughter    Colon cancer Neg Hx    Esophageal cancer Neg Hx    Rectal cancer Neg Hx    Stomach cancer Neg Hx    Social history Lives with: Alone in an apartment. Occupation: None. Support: No family members live nearby. Her daughter lives in [place]. Her neighbor sometimes helps her get to appointments but otherwise she does not have much  support.  Level of function: She is generally able to complete ADLs/IADLs on her own but it has become difficult with her chronic illnesses (including generalized weakness and pain as well as shortness of breath). She uses a walker at baseline.  PCP: Rick Duff, MD Substances: Patient actively smokes cigarettes but per recent notes she is trying to quit and has gone down to 1/2 pack per day. She drinks EtOH occasionally. No other drug use.   Review of systems A complete ROS was negative except as per HPI.  OBJECTIVE  Vitals BP (!) 175/82   Pulse 67   Temp 97.7 F (36.5 C) (Oral)   Resp 19   Ht '5\' 8"'$  (1.727 m)   Wt 102.5 kg   SpO2 98%   BMI 34.36 kg/m   Physical exam Constitutional: Chronically ill appearing. No acute distress. Converses and answers questions appropriately. Eyes: Sclera anicteric. EOM grossly intact.  HENT: Normocephalic and atraumatic. Mucous membranes are dry. OP w/o erythema or exudate. Neck: JVD to mid neck. No cervical lymphadenopathy or thyromegaly.  Cardiovascular: RRR. No M/R/G. Respiratory: Slightly decreased breath sounds bilaterally. Mild expiratory  wheeze. No use of accessory muscles. Gastrointestinal: Soft. NT/ND.  Musculoskeletal: BLE with trace edema and cool to touch, chronic. +2 pulses in BLE and BUE. Skin is generally dry and flaky.   Neurologic: Normal speech and language. No gross focal neurologic deficits are appreciated.  Psychiatric: Normal mood and affect. Normal speech and behavior.  Labs, studies, and imaging Labs, studies, and imaging from the last 24 hours per EMR and personally reviewed.  Results for orders placed or performed during the hospital encounter of 06/30/22 (from the past 24 hour(s))  Basic metabolic panel     Status: Abnormal   Collection Time: 06/30/22  9:33 AM  Result Value Ref Range   Sodium 152 (H) 135 - 145 mmol/L   Potassium 3.9 3.5 - 5.1 mmol/L   Chloride 121 (H) 98 - 111 mmol/L   CO2 26 22 - 32 mmol/L   Glucose, Bld 119 (H) 70 - 99 mg/dL   BUN 27 (H) 8 - 23 mg/dL   Creatinine, Ser 2.95 (H) 0.44 - 1.00 mg/dL   Calcium 8.1 (L) 8.9 - 10.3 mg/dL   GFR, Estimated 16 (L) >60 mL/min   Anion gap 5 5 - 15  CBC     Status: Abnormal   Collection Time: 06/30/22  9:33 AM  Result Value Ref Range   WBC 7.8 4.0 - 10.5 K/uL   RBC 3.01 (L) 3.87 - 5.11 MIL/uL   Hemoglobin 9.0 (L) 12.0 - 15.0 g/dL   HCT 29.7 (L) 36.0 - 46.0 %   MCV 98.7 80.0 - 100.0 fL   MCH 29.9 26.0 - 34.0 pg   MCHC 30.3 30.0 - 36.0 g/dL   RDW 17.1 (H) 11.5 - 15.5 %   Platelets 469 (H) 150 - 400 K/uL   nRBC 0.0 0.0 - 0.2 %  Brain natriuretic peptide     Status: Abnormal   Collection Time: 06/30/22  9:33 AM  Result Value Ref Range   B Natriuretic Peptide 698.5 (H) 0.0 - 100.0 pg/mL   EKG (06/30/2022): Per my personal review, my interpretation is NSR, normal axis, normal to slightly prolonged QT, no ST elevations. Also with LVH by R wave in aVL. No significant changes compared to prior EKG on 05/30/2022.    DG chest 2 view (06/30/2022) impression: Cardiomegaly and pulmonary vascular congestion without overt pulmonary edema.  ASSESSMENT  &  PLAN  ASHANTI RATTI is a 71 y.o. female with PMHx of HFpEF, T2DM, CKD stage IV, and prior CVA who presented to [facility] ED with worsening shortness of breath c/f CHF exacerbation and was also found to be hypernatremic.    Principal Problem:   Acute exacerbation of congestive heart failure (HCC)  Hypernatremia Na+ 152 on presentation. Per review of past labs, patient's Na+ has fluctuated in the past but she has never been this hypernatremic. Last Na+ on 7/27 was 4.7. Patient notes she has been taking 1 lasix tablet (40 mg) daily for the past few days. Her dietary habits are also inconsistent and she says she has not been drinking much water. It is difficult to assess volume status given her body habitus but she does not seem to be overtly fluid overloaded. It is therefore possible that her hypernatremia is caused by a combination of poor fluid intake and lasix usage. Her underlying CKD may also be contributing. Calculated free water deficit is 4.4 L. We will work up her hypernatremia while managing supportively at this time. Of note, prior hyperaldosteronism workup was unremarkable.  - Check urine osmolality  - Holding diuretics at this time - Encourage PO intake - Daily BMP  H/o HFpEF  Likely NYHA class III/ stage C. During her clinic visit on 7/27, patient was reported to have gained ~25 lbs over the past few months. She does report worsening dyspnea over the past few days and she had restarted lasix as noted above but without good UOP. She also had JVD on exam today. However, her lungs are generally clear to auscultation (if only slightly diminished) and she does not appear overtly fluid overloaded. She is at 100% SpO2 on RA. We will continue to monitor for volume changes while we manage her hypernatremia and HTN. Her current dyspnea is likely in the setting of deconditioning and limited mobility from her chronic illnesses.   - Holding diuretics at this time  HTN Patient's records  show her spironolactone was held at the time of her hospital discharge on 7/5 and was not restarted. She is still on amlodipine and metoprolol at home. Her BPs have been notably elevated in the ED up to the 974'B systolic. Of note, her CKD prevents her from being on ACEi's/ ARBs or higher doses of diuretics. - Continue home amlodipine and metoprolol - Holding spironolactone at this time  CKD stage IV Patient has been seen by [facility] in the past but per her charts, she has missed several appointments. Her renal disease is likely 2/2 uncontrolled HTN in the setting of medical non-adherence. Cr today was 2.95 which is at her baseline.  - Continue to monitor  H/o T2DM On sitagliptin at home though unclear if she has been adherent given her refill history.  - SSI  - Holding home sitagliptin  Checklist Diet: Renal/ carb modifed IVF: None DVT PPx: Lovenox Electrolytes: Replete as indicated Code status: Full Dispo: Observation with expected length of stay less than 2 midnights.  ------------------------  Lunette Stands, MS4 06/30/2022 Pager: [phone]  Attestation for Student Documentation:  I personally was present and performed or re-performed the history, physical exam and medical decision-making activities of this service and have verified that the service and findings are accurately documented in the student's note. Ms. Leatherwood is a 71 y/o person living with a history of uncontrolled HTN 2/2 medication non-adherence, HFpEF, CKD4 who presents with hypernatremia. She appears euvolemic on examination despite noting increased shortness of breath  with ambulation and weight gain. Hypernatremia resolved with increased PO intake and diet, she has not been eating/drinking at home. She does not have support in the area and has difficulty making appointments which has led to progression of her chronic medical conditions. Her daughter lives in [place]. At this stage, we will try to set her up  with PACE and discussed moving forward may need to move closer to family to help her make appointments. We will also screen for depression today. She is medically stable for discharge, however, patient deciding SNF vs home health PT  Riesa Pope, MD 07/01/2022, 1:40 PM

## 2022-07-01 ENCOUNTER — Encounter (HOSPITAL_COMMUNITY): Payer: Self-pay | Admitting: Internal Medicine

## 2022-07-01 DIAGNOSIS — Z794 Long term (current) use of insulin: Secondary | ICD-10-CM

## 2022-07-01 DIAGNOSIS — Z602 Problems related to living alone: Secondary | ICD-10-CM | POA: Diagnosis not present

## 2022-07-01 DIAGNOSIS — N184 Chronic kidney disease, stage 4 (severe): Secondary | ICD-10-CM | POA: Diagnosis not present

## 2022-07-01 DIAGNOSIS — F17210 Nicotine dependence, cigarettes, uncomplicated: Secondary | ICD-10-CM | POA: Diagnosis not present

## 2022-07-01 DIAGNOSIS — Z7401 Bed confinement status: Secondary | ICD-10-CM | POA: Diagnosis not present

## 2022-07-01 DIAGNOSIS — E870 Hyperosmolality and hypernatremia: Secondary | ICD-10-CM | POA: Diagnosis not present

## 2022-07-01 DIAGNOSIS — I5030 Unspecified diastolic (congestive) heart failure: Secondary | ICD-10-CM | POA: Diagnosis not present

## 2022-07-01 DIAGNOSIS — E1122 Type 2 diabetes mellitus with diabetic chronic kidney disease: Secondary | ICD-10-CM | POA: Diagnosis not present

## 2022-07-01 DIAGNOSIS — I10 Essential (primary) hypertension: Secondary | ICD-10-CM | POA: Diagnosis not present

## 2022-07-01 DIAGNOSIS — I130 Hypertensive heart and chronic kidney disease with heart failure and stage 1 through stage 4 chronic kidney disease, or unspecified chronic kidney disease: Secondary | ICD-10-CM | POA: Diagnosis not present

## 2022-07-01 LAB — CBC
HCT: 25.3 % — ABNORMAL LOW (ref 36.0–46.0)
Hemoglobin: 8.1 g/dL — ABNORMAL LOW (ref 12.0–15.0)
MCH: 30.6 pg (ref 26.0–34.0)
MCHC: 32 g/dL (ref 30.0–36.0)
MCV: 95.5 fL (ref 80.0–100.0)
Platelets: 401 K/uL — ABNORMAL HIGH (ref 150–400)
RBC: 2.65 MIL/uL — ABNORMAL LOW (ref 3.87–5.11)
RDW: 17.2 % — ABNORMAL HIGH (ref 11.5–15.5)
WBC: 6.3 K/uL (ref 4.0–10.5)
nRBC: 0.3 % — ABNORMAL HIGH (ref 0.0–0.2)

## 2022-07-01 LAB — MAGNESIUM: Magnesium: 1.8 mg/dL (ref 1.7–2.4)

## 2022-07-01 LAB — ALBUMIN: Albumin: 2.1 g/dL — ABNORMAL LOW (ref 3.5–5.0)

## 2022-07-01 LAB — BASIC METABOLIC PANEL
Anion gap: 9 (ref 5–15)
BUN: 30 mg/dL — ABNORMAL HIGH (ref 8–23)
CO2: 21 mmol/L — ABNORMAL LOW (ref 22–32)
Calcium: 7.6 mg/dL — ABNORMAL LOW (ref 8.9–10.3)
Chloride: 108 mmol/L (ref 98–111)
Creatinine, Ser: 2.95 mg/dL — ABNORMAL HIGH (ref 0.44–1.00)
GFR, Estimated: 16 mL/min — ABNORMAL LOW (ref >60)
Glucose, Bld: 72 mg/dL (ref 70–99)
Potassium: 3.2 mmol/L — ABNORMAL LOW (ref 3.5–5.1)
Sodium: 138 mmol/L (ref 135–145)

## 2022-07-01 LAB — GLUCOSE, CAPILLARY
Glucose-Capillary: 84 mg/dL (ref 70–99)
Glucose-Capillary: 86 mg/dL (ref 70–99)
Glucose-Capillary: 146 mg/dL — ABNORMAL HIGH (ref 70–99)

## 2022-07-01 LAB — OSMOLALITY, URINE: Osmolality, Ur: 390 mOsm/kg (ref 300–900)

## 2022-07-01 MED ORDER — POTASSIUM CHLORIDE 10 MEQ/100ML IV SOLN
10.0000 meq | INTRAVENOUS | Status: AC
  Administered 2022-07-01 (×4): 10 meq via INTRAVENOUS
  Filled 2022-07-01 (×4): qty 100

## 2022-07-01 MED ORDER — FUROSEMIDE 40 MG PO TABS: 40.0000 mg | ORAL_TABLET | Freq: Every day | ORAL | 0 refills | Status: DC

## 2022-07-01 NOTE — NC FL2 (Signed)
[place] LEVEL OF CARE SCREENING TOOL     IDENTIFICATION  Patient Name: Alicia [name] Birthdate: [birth year] Sex: female Admission Date (Current Location): 06/30/2022  [facility] and [place] Number:      Facility and Address:         Provider Number:    Attending Physician Name and Address:  Sid Falcon, MD  Relative Name and Phone Number:  MANDALYN, PASQUA (Daughter)   [phone] (Mobile)    Current Level of Care: Hospital Recommended Level of Care: [place] Prior Approval Number:    Date Approved/Denied:   PASRR Number: [id number]  Discharge Plan: Home    Current Diagnoses: Patient Active Problem List   Diagnosis Date Noted   Acute exacerbation of congestive heart failure ([place]) 06/30/2022   Hypernatremia 06/30/2022   Hyperkalemia 06/17/2022   Normocytic anemia 06/17/2022   Intertrigo    Metabolic acidosis    Cellulitis 05/20/2022   Vitamin D deficiency 05/20/2022   Headache 02/16/2022   Disequilibrium 12/01/2021   (HFpEF) heart failure with preserved ejection fraction ([place]) 07/18/2021   Exacerbation of RAD (reactive airway disease) 02/27/2020   Stage 4 chronic kidney disease ([place]) 02/27/2020   Tobacco use 02/27/2020   Prolonged Q-T interval on ECG 04/27/2018   Diabetes mellitus type 2 in obese ([place]) 03/16/2018   Severe obesity (BMI >= 40) (HCC)    CVA (cerebral vascular accident) ([place]) 03/15/2018   Peripheral vascular disease, unspecified ([place]) 11/30/2012   Hyperlipidemia 07/20/2007   Primary hypertension 07/20/2007    Orientation RESPIRATION BLADDER Height & Weight     Self, Time, Situation, Place  Normal Continent Weight: 223 lb 8.7 oz (101.4 kg) Height:  '5\' 8"'$  (172.7 cm)  BEHAVIORAL SYMPTOMS/MOOD NEUROLOGICAL BOWEL NUTRITION STATUS      Continent Diet (See d/c summary)  AMBULATORY STATUS COMMUNICATION OF NEEDS Skin   Extensive Assist Verbally Normal                       Personal Care  Assistance Level of Assistance  Bathing, Feeding, Dressing Bathing Assistance: Maximum assistance Feeding assistance: Independent Dressing Assistance: Maximum assistance     Functional Limitations Info  Sight, Hearing, Speech Sight Info: Impaired Hearing Info: Adequate Speech Info: Adequate    SPECIAL CARE FACTORS FREQUENCY  PT (By licensed PT), OT (By licensed OT)     PT Frequency: 5x/week OT Frequency: 5x/week            Contractures Contractures Info: Not present    Additional Factors Info  Code Status, Allergies Code Status Info: Full code Allergies Info: Nifedipine Er, Atorvastatin, latex           Current Medications (07/01/2022):  This is the current hospital active medication list Current Facility-Administered Medications  Medication Dose Route Frequency Provider Last Rate Last Admin   acetaminophen (TYLENOL) tablet 1,000 mg  1,000 mg Oral Q6H PRN Katsadouros, Vasilios, MD   1,000 mg at 06/30/22 2144   Or   acetaminophen (TYLENOL) suppository 650 mg  650 mg Rectal Q6H PRN Katsadouros, Vasilios, MD       albuterol (PROVENTIL) (2.5 MG/3ML) 0.083% nebulizer solution 3 mL  3 mL Inhalation Q6H PRN Katsadouros, Vasilios, MD       amLODipine (NORVASC) tablet 10 mg  10 mg Oral Daily Katsadouros, Vasilios, MD   10 mg at 07/01/22 6237   diclofenac Sodium (VOLTAREN) 1 % topical gel 4 g  4 g Topical QID Katsadouros, Vasilios, MD   4 g  at 07/01/22 0825   enoxaparin (LOVENOX) injection 30 mg  30 mg Subcutaneous Q24H Katsadouros, Vasilios, MD   30 mg at 06/30/22 2144   famotidine (PEPCID) tablet 20 mg  20 mg Oral Daily Katsadouros, Vasilios, MD   20 mg at 07/01/22 0824   insulin aspart (novoLOG) injection 0-20 Units  0-20 Units Subcutaneous TID AC & HS Gilles Chiquito B, MD       metoprolol succinate (TOPROL-XL) 24 hr tablet 100 mg  100 mg Oral Daily Katsadouros, Vasilios, MD   100 mg at 07/01/22 0824   potassium chloride 10 mEq in 100 mL IVPB  10 mEq Intravenous Q1 Hr x 4  Katsadouros, Vasilios, MD 100 mL/hr at 07/01/22 0955 10 mEq at 07/01/22 0955   rosuvastatin (CRESTOR) tablet 10 mg  10 mg Oral QPM Katsadouros, Vasilios, MD   10 mg at 06/30/22 2144     Discharge Medications: Please see discharge summary for a list of discharge medications.  Relevant Imaging Results:  Relevant Lab Results:   Additional Information SS# [place] Elvina Bosch, LCSW

## 2022-07-01 NOTE — TOC Initial Note (Addendum)
Transition of Care [facility]) - Initial/Assessment Note    Patient Details  Name: Alicia [name] MRN: [id number] Date of Birth: [birth year]  [facility] CM/SW Contact:    Bethann Berkshire, [place] Phone Number: 07/01/2022, 11:20 AM  Clinical Narrative:                  CSW met with pt to discuss SNF rec. Pt lives at home alone; has a daughter who lives in [place]. She reports she is active with [facility] and also has aides from [place] who come 4x/week, 3hours/visit. She states she was recently was at [facility] for about 2 months. Pt has medicare and medicaid. CSW explained SNF workup and insurance auth process. Pt is agreeable to SNF workup. Fl2 Completed and bed requests sent in hub.   1430: CSW provided pt with bed offers. She then explains she is unsure if she wants to do SNF vs HH. CSW will follow up later this afternoon for decision.   1500: CSW notified by MD that pt is not wanting SNF and would prefer to go home with [facility]. She is also requesting information about PACE program. CSW confirmed this with pt this is her desire and provided PACE information. Pt states she would need [facility] transport home. States she has rollator and 3 in1 at home and does not need any additional equipment.    [facility] confirmed they are active with pt with [facility] PT/RN. CSW updated them that pt will DC today.   Discharge packet ready at RN station. Will call [facility] once DC orders/summary are complete.   Expected Discharge Plan: Skilled Nursing Facility Barriers to Discharge: SNF Pending bed offer, Insurance Authorization   Patient Goals and CMS Choice        Expected Discharge Plan and Services Expected Discharge Plan: [place] arrangements for the past 2 months: [place], [place]                                      Prior Living Arrangements/Services Living arrangements for the past 2 months: [place], [place] Lives with:: Self Patient language and need for interpreter reviewed:: Yes        Need for Family Participation in Patient Care: No (Comment) Care giver support system in place?: No (comment)   Criminal Activity/Legal Involvement Pertinent to Current Situation/Hospitalization: No - Comment as needed  Activities of Daily Living Home Assistive Devices/Equipment: Cane (specify quad or straight) ADL Screening (condition at time of admission) Patient's cognitive ability adequate to safely complete daily activities?: Yes Is the patient deaf or have difficulty hearing?: No Does the patient have difficulty seeing, even when wearing glasses/contacts?: No Does the patient have difficulty concentrating, remembering, or making decisions?: No Patient able to express need for assistance with ADLs?: Yes Does the patient have difficulty dressing or bathing?: Yes Independently performs ADLs?: Yes (appropriate for developmental age) Does the patient have difficulty walking or climbing stairs?: Yes Weakness of Legs: Both Weakness of Arms/Hands: None  Permission Sought/Granted   Permission granted to share information with : Yes, Verbal Permission Granted  Share Information with NAME: Dunlap,Jaylean (Daughter)   [phone] (Mobile)           Emotional Assessment Appearance:: Appears stated age Attitude/Demeanor/Rapport: Engaged Affect (typically observed): Calm Orientation: : Oriented to Self,  Oriented to Place, Oriented to  Time, Oriented to Situation Alcohol / Substance Use: Not Applicable Psych Involvement: No (comment)  Admission diagnosis:  Hypernatremia [E87.0] Acute exacerbation of congestive heart failure (HCC) [I50.9] Acute on chronic congestive heart failure, unspecified heart failure type ([place]) [I50.9] Patient Active Problem List   Diagnosis Date Noted   Acute exacerbation of congestive heart failure ([place]) 06/30/2022   Hypernatremia  06/30/2022   Hyperkalemia 06/17/2022   Normocytic anemia 06/17/2022   Intertrigo    Metabolic acidosis    Cellulitis 05/20/2022   Vitamin D deficiency 05/20/2022   Headache 02/16/2022   Disequilibrium 12/01/2021   (HFpEF) heart failure with preserved ejection fraction ([place]) 07/18/2021   Exacerbation of RAD (reactive airway disease) 02/27/2020   Stage 4 chronic kidney disease ([place]) 02/27/2020   Tobacco use 02/27/2020   Prolonged Q-T interval on ECG 04/27/2018   Diabetes mellitus type 2 in obese ([place]) 03/16/2018   Severe obesity (BMI >= 40) (HCC)    CVA (cerebral vascular accident) ([place]) 03/15/2018   Peripheral vascular disease, unspecified ([place]) 11/30/2012   Hyperlipidemia 07/20/2007   Primary hypertension 07/20/2007   PCP:  Rick Duff, MD Pharmacy:   [facility] [place] [address] [place] ([place]) [place] [address] Phone: [phone] Fax: [phone]     Social Determinants of Health (SDOH) Interventions    Readmission Risk Interventions    03/02/2020    1:08 PM  Readmission Risk Prevention Plan  Transportation Screening Complete  PCP or Specialist Appt within 5-7 Days Complete  Home Care Screening Complete  Medication Review (RN CM) Complete

## 2022-07-01 NOTE — Progress Notes (Signed)
Desats at 85 when asleep. Placed on 2L O2 otherwise Ok in room air.

## 2022-07-01 NOTE — Care Management Obs Status (Signed)
MEDICARE OBSERVATION STATUS NOTIFICATION   Patient Details  Name: Alicia [name] MRN: [id number] Date of Birth: 1951-03-27   Medicare Observation Status Notification Given:  Yes    Zenon Mayo, RN 07/01/2022, 5:31 PM

## 2022-07-01 NOTE — Consult Note (Signed)
   [facility] CM Inpatient Consult   07/01/2022  [name] 1951/04/08 [id number]  [place] [facility] [ACO] Patient: [name]  Primary Care Provider:  Rick Duff, MD, [facility] Internal Medicine, is an embedded provider with a Chronic Care Management team and program, and is listed for the transition of care follow up and appointments.  Patient was screened for Embedded practice service needs for chronic care management/care coordination.  Patient currently active. Discussed in unit progression. Reviewed for post hospital and patient evaluation with therapy is recommended for a skilled nursing facility level of care, if refuses for home with home health.  Patient showing a [facility] Embedded RNCM on her Care Teams noted but no active care coordination noted at this time  11:00 Met with patient at the bedside after MD just visited.  She states she is feeling fair. She endorses her PCP. Spoke with patient about post hospital follow up needs.  She states she has therapy and a nurse that comes out to her home.  Explained about having access to Embedded team at provider.  Gave patient a reminder for appointment card with 24 hour nurse advise line.  Patient was gracious and thankful for information she states. She states that right now she is trying to get some rest.  Plan: Continue to follow. Notification was sent to the [place] Management and made aware of patient hospitalized.  Please contact for further questions,  Alicia Brood, RN BSN [facility] Liaison  [phone] business mobile phone Toll free office [phone]  Fax number: [phone] [email or web address]

## 2022-07-01 NOTE — Discharge Summary (Signed)
Name: Alicia [name] MRN: [id number] DOB: Jan 13, 1951 71 y.o. PCP: Rick Duff, MD  Date of Admission: 06/30/2022  9:22 AM Date of Discharge: 07/01/22 Attending Physician: Dr. Daryll Drown  Discharge Diagnosis: Principal Problem:   Hypernatremia    Discharge Medications: Allergies as of 07/01/2022       Reactions   Nifedipine Er Other (See Comments)   Caused nose bleeds in higher doses (90 mg., namely)   Atorvastatin Swelling   Lip swelling to lipitor Patient tolerates crestor   Latex Rash        Medication List     STOP taking these medications    pantoprazole 40 MG tablet Commonly known as: Protonix       TAKE these medications    Acetaminophen Extra Strength 500 MG Tabs Take 2 tablets (1,000 mg total) by mouth every 6 (six) hours.   albuterol 108 (90 Base) MCG/ACT inhaler Commonly known as: VENTOLIN HFA Inhale 2 puffs into the lungs every 6 (six) hours as needed for wheezing or shortness of breath.   amLODipine 10 MG tablet Commonly known as: NORVASC Take 1 tablet (10 mg total) by mouth daily.   diclofenac Sodium 1 % Gel Commonly known as: VOLTAREN Apply 4 g topically 4 (four) times daily. Apply to both knees.   famotidine 20 MG tablet Commonly known as: PEPCID Take 20 mg by mouth daily.   furosemide 40 MG tablet Commonly known as: Lasix Take 1 tablet (40 mg total) by mouth daily.   metoprolol succinate 100 MG 24 hr tablet Commonly known as: TOPROL-XL Take 1 tablet (100 mg total) by mouth daily. Take with or immediately following a meal. What changed: when to take this   OXYGEN Inhale 2 L into the lungs daily as needed (oxygen).   rosuvastatin 10 MG tablet Commonly known as: CRESTOR Take 1 tablet (10 mg total) by mouth every evening.   sitaGLIPtin 25 MG tablet Commonly known as: Januvia Take 1 tablet (25 mg total) by mouth daily.        Disposition and follow-up:   Alicia [name] was discharged from [facility] in Stable condition.  At the hospital follow up visit please address:  1.  Follow-up:  a. Electrolyte abnormalities - f/u Na, K    b. Heart Failure - Assess volume status   c. Medication non-adherence - review meds   d. Pace program and if patient has called  2.  Labs / imaging needed at time of follow-up: BMP  3.  Pending labs/ test needing follow-up: none  4.  Medication Changes  Holding spironolactone   Follow-up Appointments:  Follow-up Information     [facility] Follow up.   Specialty: Home Health Services Why: [facility] will call you to set up apt times Contact information: [address] [address] [phone]                 Hospital Course by problem list: At time of hospital follow-up visit, please address the following: - Please ask patient if she has been able to complete the PACE application, and if not, what trouble she is having with it - Monitor diet and fluid intake - Monitor volume status - Consider autoimmune workup to evaluate for other potential etiologies of chronic pain (ex. lupus) - Continue to monitor L breast lesion from prior cellulitis - Nephrology follow up  -------------------------------  Alicia [name] is a 71 y.o. female with PMHx of  HFpEF, T2DM, CKD stage IV, and prior CVA who presented to [facility] ED on 06/30/2022 with worsening shortness of breath c/f CHF exacerbation and was also found to be hypernatremic.   Problem-based hospital course:  Hypernatremia Na+ was 152 on presentation. Per review of past labs, patient's Na+ has fluctuated in the past but she has never been this hypernatremic. Last Na+ on 7/27 was 4.7. Of note, prior hyperaldosteronism workup was unremarkable.   Per review of her charts, patient was recently admitted 6/30 - 7/5 for AKI on CKD. Her home diuretics (lasix and spironolactone) were held with plans to reassess and potentially restart at  hospital follow up. After an ED visit for generalized weakness on 7/10, she was seen by the IM clinic on 7/27 and appeared euvolemic but was also noted to have gained ~25 lbs in the last month. Repeat BMP showed improvement in renal function. She was instructed to follow up with nephrology. It appears that she was later restarted on lasix 40 mg BID on 8/2 per medication order records.   Patient states she has been taking lasix but only once daily because she is worried about her kidneys. Her dietary habits are also inconsistent and has not been drinking much water. It is difficult to assess volume status given her body habitus but she appeared euvolemic on exam. It is therefore possible that her hypernatremia was caused by a combination of poor fluid intake and resuming lasix usage. Her underlying CKD may have also contributed. Calculated free water deficit was 4.4 L. She was not given any lasix or fluid boluses in the hospital and her Na+ improved to 138 the next day. Patient noted she was eating and drinking more while in the hospital compared to at home. At discharge, patient was restarted on lasix and was instructed on the importance of maintaining good PO intake. She will see the IM clinic next week for close follow up.    H/o HFpEF  Likely NYHA class III/ stage C. During her clinic visit on 7/27, patient was reported to have gained ~25 lbs over the past few months. She does report worsening dyspnea over the past few days and she had restarted lasix as noted above but without good UOP. However, she appears generally euvolemic on exam and she is at 100% SpO2 on RA. Her current dyspnea is likely in the setting of deconditioning and limited mobility from her chronic illnesses. We will resume lasix at discharge and continue to monitor as outpatient.    HTN Patient's records show her spironolactone was held at the time of her hospital discharge on 7/5 and was not restarted. She is still on amlodipine and  metoprolol at home. Her BPs were notably elevated in the ED up to the 419'F systolic. Her home amlodipine and metoprolol were restarted on admission and her BPs. Subsequently improved We will continue these medications at discharge and also keep holding the spironolactone. Of note, her CKD prevents her from being on ACEi's/ ARBs or higher doses of diuretics.    CKD stage IV Patient has been seen by [facility] in the past but per her charts, she has missed several appointments. Her renal disease is likely from uncontrolled HTN in the setting of medical non-adherence. Cr at admission was 2.95 which is at her baseline. She notes she has an appointment with [facility] soon and she has been advised to attend this appointment.    H/o T2DM On sitagliptin at home though unclear if she has  been adherent given her refill history. She was put on SSI in the hospital and will resume home sitagliptin at discharge.   Social determinants of health Patient does not have good support in the area and has difficulty making appointments which has led to progression of her chronic medical conditions. Her daughter lives in [place]. During this hospital stay, we introduced the PACE program to her and she seems very amenable to trying the program. Social work has provided her with more information. We also discussed that moving forward, she may need to move closer to family to help her make appointments and manage her care.   PHQ-9 (07/01/2022) 1. Little interest or pleasure in doing things = 3 2. Feeling down, depressed, or hopeless = 1 3. Trouble falling or staying asleep, or sleeping too much = 1 4. Feeling tired or having little energy = 3 5. Poor appetite or overeating = 2 6. Feeling bad about yourself -- or that you are a failure or have let yourself or your family down = 0 7. Trouble concentrating on things, such as reading the newspaper or watching television = 0 8. Moving or speaking so slowly that  other people could have noticed? Or the opposite -- being so fidgety or restless that you have been moving around a lot more than usual = 0 9. Thoughts that you would be better off dead or of hurting yourself in some way = 0 TOTAL = 10 How difficult have these problems made it for you to do your work, take care of things at home, or get along with other people? = Extremely difficult  -------------------------------  Subjective NAEON, VSS. Patient reports feeling somewhat better compared to yesterday. She also has somewhat better PO intake of both solids and liquids. She continues to have difficulty with exertion due to chronic pain/ mobility issues and some shortness of breath, but she seems to be at her baseline. No N/V, no chest pain or abdominal pain.   Objective BP (!) 165/69   Pulse 67   Temp (!) 97.4 F (36.3 C) (Oral)   Resp 20   Ht '5\' 8"'$  (1.727 m)   Wt 101.4 kg   SpO2 93%   BMI 33.99 kg/m    Constitutional: Chronically ill appearing. No acute distress. Converses and answers questions appropriately. Eyes: Sclera anicteric. EOM grossly intact.  HENT: Normocephalic and atraumatic. Mucous membranes are dry. OP w/o erythema or exudate. Neck: JVD to mid neck. No cervical lymphadenopathy or thyromegaly.  Cardiovascular: RRR. No M/R/G. Respiratory: Slightly decreased breath sounds bilaterally. Mild expiratory wheeze. No use of accessory muscles. Gastrointestinal: Soft. NT/ND.  Musculoskeletal: BLE with trace edema and cool to touch, chronic. +2 pulses in BLE and BUE. Skin is generally dry and flaky. Old lesion under L breast from prior cellulitis, well-healed, no warmth/ erythema/ discharge.  Neurologic: Normal speech and language. No gross focal neurologic deficits are appreciated.  Psychiatric: Normal mood and affect. Normal speech and behavior.    Pertinent Labs, Studies, and Procedures:     Latest Ref Rng & Units 07/01/2022    3:49 AM 06/30/2022    9:33 AM 06/16/2022    11:46 AM  CBC  WBC 4.0 - 10.5 K/uL 6.3  7.8  7.4   Hemoglobin 12.0 - 15.0 g/dL 8.1  9.0  9.2   Hematocrit 36.0 - 46.0 % 25.3  29.7  30.9   Platelets 150 - 400 K/uL 401  469  297        Latest  Ref Rng & Units 07/01/2022    3:49 AM 06/30/2022    9:33 AM 06/16/2022   11:46 AM  CMP  Glucose 70 - 99 mg/dL 72  119  114   BUN 8 - 23 mg/dL 30  27  32   Creatinine 0.44 - 1.00 mg/dL 2.95  2.95  2.97   Sodium 135 - 145 mmol/L 138  152  140   Potassium 3.5 - 5.1 mmol/L 3.2  3.9  4.7   Chloride 98 - 111 mmol/L 108  121  109   CO2 22 - 32 mmol/L '21  26  21   '$ Calcium 8.9 - 10.3 mg/dL 7.6  8.1  8.1     DG Chest 2 View  Result Date: 06/30/2022 CLINICAL DATA:  shob EXAM: CHEST - 2 VIEW COMPARISON:  Chest x-ray 05/30/2022. FINDINGS: Enlarged cardiac silhouette. Pulmonary vascular congestion. No overt pulmonary edema. No confluent consolidation. No visible pleural effusions or pneumothorax. Polyarticular degenerative change including severe right shoulder degenerative change and chronic loose bodies. IMPRESSION: Cardiomegaly and pulmonary vascular congestion without overt pulmonary edema. Electronically Signed   By: Margaretha Sheffield M.D.   On: 06/30/2022 09:54     Discharge Instructions: Discharge Instructions     Call MD for:  difficulty breathing, headache or visual disturbances   Complete by: As directed    Call MD for:  extreme fatigue   Complete by: As directed    Call MD for:  hives   Complete by: As directed    Call MD for:  persistant dizziness or light-headedness   Complete by: As directed    Call MD for:  persistant nausea and vomiting   Complete by: As directed    Call MD for:  redness, tenderness, or signs of infection (pain, swelling, redness, odor or green/yellow discharge around incision site)   Complete by: As directed    Call MD for:  severe uncontrolled pain   Complete by: As directed    Call MD for:  temperature >100.4   Complete by: As directed    Diet - low sodium heart  healthy   Complete by: As directed    Increase activity slowly   Complete by: As directed        Signed: Riesa Pope, MD 07/01/2022, 6:28 PM   Pager: [phone]

## 2022-07-01 NOTE — Progress Notes (Signed)
  Date: 07/01/2022  Patient name: Alicia [name]  Medical record number: [id number]  Date of birth: 1951/10/04        I have seen and evaluated this patient and I have discussed the plan of care with the house staff. Please see their upcoming note for complete details.   Alicia [name] presented for SOB, found to be hypernatremic.  She has noted concern taking her lasix as she has been told she has CKD.  She has limited PO intake at home, chronic pain which limits her mobility and difficulty getting to outpatient appointments.  She was admitted earlier in the summer and her diuretics were held.  She has gained weight due to this and was requested to start taking her lasix, but she has only been taking once per day.  She was monitored for her high Na and this improved.  We continued to hold her  home spironolactone.  She will need to follow up with nephrology as an outpatient, though it seems she will have difficulty making that happen.  Team provided her with PT, who recommended SNF rehab, however, she declined this option.  She would be a good candidate for PACE program and details were provided.  Overall, the acute issues that brought her in are improved.  She is no longer SOB, not requiring oxygen.  She has complicated diuretic needs, but likely needs continued lasix and also adequate PO intake.  She will be discharged with options for services and we will also have our in clinic CM reach out to her to assist.  Patient has a daughter in [place] who might be able to provide further support.  Follow up soon after discharge in the clinic.  Unfortunately, given living situation and food insecurity, she will have a high chance for returning to the hospital.   Sid Falcon, MD 07/01/2022, 3:16 PM

## 2022-07-01 NOTE — Discharge Instructions (Signed)
Alicia [name],   You were hospitalized at [facility] for shortness of breath and high sodium levels in your blood. Both of these conditions improved as you regained your appetite and were eating and drinking better in the hospital. We will   Please note these changes made to your medications: **  We also recommend the following:  - Please complete the application for the PACE program; we believe this program may benefit you greatly and help you manage your medical care.   We have arranged for you to follow up: - Internal Medicine Clinic (Dr. Coy Saunas) on Tuesday, August 15th at 10:15 am  If you experience any of the following symptoms - fevers, chills, chest pain - please call your primary care doctor to determine if you need to be seen. If your symptoms are very severe or you feel very sick, please visit to the nearest emergency department to be evaluated.  You may call our clinic if you have any questions or concerns; we may be able to help and keep you from a long and expensive emergency department wait. Our clinic and after hours phone number is [phone]. The best time to call is Monday through Friday, 9 AM to 4 PM, but there is always someone available 24/7 if you have an emergency.   If you need medication refills, please notify your pharmacy one week in advance and they will send [place] a request.   Thank you for allowing [place] to be a part of your care. Do not hesitate to reach out - we are here to help.  Sincerely, [facility] Internal Medicine Teaching Service

## 2022-07-01 NOTE — Evaluation (Signed)
Physical Therapy Evaluation Patient Details Name: Alicia [name] MRN: [id number] DOB: 1950/11/26 Today's Date: 07/01/2022  History of Present Illness  71 y.o. female presents to  [facility] hospital on 06/30/2022 with SOB. Pt found to be hypernatremic, also admitted for management of CHF exacerbation. PMH: CHF, HTN, PVD, chronic respiratory failure on 2L supplemental oxygen, hx of CVA, T2DM, CKD4  Clinical Impression  Pt presents to PT with deficits in strength, power, functional mobility, gait, balance. Pt is limited by reports of pain in extremities which she attributes to edema. Pt expresses a significant fear of falling during this session. Pt requires physical assistance to mobilize and is unable to successfully stand at this time, with poor LE activation noted with attempts to initiate sit to stand. Pt will benefit from aggressive mobilization in an effort to restore independence in household mobility. PT recommends SNF placement at this time as the pt is unable to successfully mobilize within the home and has limited caregiver support.       Recommendations for follow up therapy are one component of a multi-disciplinary discharge planning process, led by the attending physician.  Recommendations may be updated based on patient status, additional functional criteria and insurance authorization.  Follow Up Recommendations Skilled nursing-short term rehab (<3 hours/day) (HHPT if pt refuses SNF placement) Can patient physically be transported by private vehicle: No    Assistance Recommended at Discharge Frequent or constant Supervision/Assistance  Patient can return home with the following  Two people to help with walking and/or transfers;Two people to help with bathing/dressing/bathroom;Assistance with cooking/housework;Assist for transportation;Help with stairs or ramp for entrance    Equipment Recommendations None recommended by PT  Recommendations for Other Services       Functional  Status Assessment Patient has had a recent decline in their functional status and demonstrates the ability to make significant improvements in function in a reasonable and predictable amount of time.     Precautions / Restrictions Precautions Precautions: Fall Restrictions Weight Bearing Restrictions: No      Mobility  Bed Mobility Overal bed mobility: Needs Assistance Bed Mobility: Supine to Sit, Sit to Supine     Supine to sit: Min assist, HOB elevated Sit to supine: Min assist, HOB elevated   General bed mobility comments: assist via hand hold to pull trunk into sitting, assistance for LE management when returning to supine    Transfers Overall transfer level: Needs assistance Equipment used: Rolling walker (2 wheels) Transfers: Sit to/from Stand Sit to Stand: From elevated surface, Total assist (unable to successfully stand in 2 attempts)           General transfer comment: pt with limited LE activation to initiate stand despite following cues well to rock for momentum and flex trunk    Ambulation/Gait                  Stairs            Wheelchair Mobility    Modified Rankin (Stroke Patients Only)       Balance Overall balance assessment: Needs assistance Sitting-balance support: No upper extremity supported, Feet supported Sitting balance-Leahy Scale: Good     Standing balance support:  (unable to stand)                                 Pertinent Vitals/Pain Pain Assessment Pain Assessment: Faces Faces Pain Scale: Hurts even more Pain Location: bilateral upper extremities Pain  Descriptors / Indicators: Sore Pain Intervention(s): Monitored during session    Home Living Family/patient expects to be discharged to:: Private residence Living Arrangements: Alone Available Help at Discharge: Personal care attendant;Available PRN/intermittently (4 days/week, 2 hours each day) Type of Home: Apartment Home Access: Stairs to  enter Entrance Stairs-Rails: Right;Left Entrance Stairs-Number of Steps: 2   Home Layout: One level Home Equipment: Curator (4 wheels);Rolling Walker (2 wheels);Cane - quad;Cane - single point;Tub bench Additional Comments: 2L O2 PRN at home    Prior Function Prior Level of Function : Needs assist             Mobility Comments: pt reports ambulating with use of RW within the home ADLs Comments: requires assistance PRN with dressing, assistance from aide for household chores and cooking     Hand Dominance   Dominant Hand: Right    Extremity/Trunk Assessment   Upper Extremity Assessment Upper Extremity Assessment: RUE deficits/detail;LUE deficits/detail RUE Deficits / Details: BUE edema noted, pain limiting strength at this time, ROM WFL LUE Deficits / Details: BUE edema noted, pain limiting strength at this time, ROM [facility]    Lower Extremity Assessment Lower Extremity Assessment: Generalized weakness    Cervical / Trunk Assessment Cervical / Trunk Assessment: Normal  Communication   Communication: No difficulties  Cognition Arousal/Alertness: Awake/alert Behavior During Therapy: Anxious (fear of falling) Overall Cognitive Status: Within Functional Limits for tasks assessed                                          General Comments General comments (skin integrity, edema, etc.): VSS on RA, PT notes edema in all extremities currently    Exercises     Assessment/Plan    PT Assessment Patient needs continued PT services  PT Problem List Decreased strength;Decreased activity tolerance;Decreased mobility;Decreased balance;Decreased knowledge of use of DME;Pain       PT Treatment Interventions DME instruction;Gait training;Stair training;Functional mobility training;Therapeutic activities;Therapeutic exercise;Balance training;Neuromuscular re-education;Patient/family education    PT Goals (Current goals can be found in the Care  Plan section)  Acute Rehab PT Goals Patient Stated Goal: to improve strength and reduce pain in extremities PT Goal Formulation: With patient Time For Goal Achievement: 07/15/22 Potential to Achieve Goals: Fair    Frequency Min 3X/week (maintain 3x as pt appears likely to refuse SNF)     Co-evaluation               AM-PAC PT "6 Clicks" Mobility  Outcome Measure Help needed turning from your back to your side while in a flat bed without using bedrails?: A Little Help needed moving from lying on your back to sitting on the side of a flat bed without using bedrails?: A Little Help needed moving to and from a bed to a chair (including a wheelchair)?: Total Help needed standing up from a chair using your arms (e.g., wheelchair or bedside chair)?: Total Help needed to walk in hospital room?: Total Help needed climbing 3-5 steps with a railing? : Total 6 Click Score: 10    End of Session Equipment Utilized During Treatment: Gait belt Activity Tolerance: Patient limited by pain;Other (comment) (limited by fear of falling) Patient left: in bed;with call bell/phone within reach Nurse Communication: Mobility status PT Visit Diagnosis: Other abnormalities of gait and mobility (R26.89);Muscle weakness (generalized) (M62.81)    Time: 6734-1937 PT Time Calculation (min) (ACUTE ONLY): 26  min   Charges:   PT Evaluation $PT Eval Low Complexity: 1 Low          Zenaida Niece, PT, DPT Acute Rehabilitation Office [place] 07/01/2022, 9:23 AM

## 2022-07-04 ENCOUNTER — Telehealth: Payer: Self-pay | Admitting: *Deleted

## 2022-07-04 ENCOUNTER — Other Ambulatory Visit: Payer: Self-pay

## 2022-07-04 DIAGNOSIS — I10 Essential (primary) hypertension: Secondary | ICD-10-CM

## 2022-07-04 DIAGNOSIS — E1169 Type 2 diabetes mellitus with other specified complication: Secondary | ICD-10-CM

## 2022-07-04 DIAGNOSIS — N184 Chronic kidney disease, stage 4 (severe): Secondary | ICD-10-CM

## 2022-07-04 DIAGNOSIS — E870 Hyperosmolality and hypernatremia: Secondary | ICD-10-CM

## 2022-07-04 NOTE — Patient Outreach (Signed)
[facility]) Care Management  07/04/2022  YSABELLA BABIARZ 06-Apr-1951 [id number]  Embedded provider: [facility]  Follow up: Patient follow up for needs since transitioning home and not to SNF. Noted concerns for readmission prevention.  Call attempt to phone number listed and not able to leave a voicemail message.  Will refer to Embedded provider for post hospital follow up with [facility], as well.  Natividad Brood, RN BSN [facility] Liaison  [phone] business mobile phone Toll free office [phone]  Fax number: [phone] [email or web address]

## 2022-07-04 NOTE — Chronic Care Management (AMB) (Signed)
  Care Coordination   Note   07/04/2022 Name: Alicia [name] MRN: [id number] DOB: Sep 06, 1951  [name] is a 70 y.o. year old female who sees Rick Duff, MD for primary care. I reached out to Arta Bruce by phone today to offer care coordination services.  [name] was given information about Care Coordination services today including:   The Care Coordination services include support from the care team which includes your Nurse Coordinator, Clinical Social Worker, or Pharmacist.  The Care Coordination team is here to help remove barriers to the health concerns and goals most important to you. Care Coordination services are voluntary, and the patient may decline or stop services at any time by request to their care team member.   Care Coordination Consent Status: Patient agreed to services and verbal consent obtained.   Follow up plan:  Telephone appointment with care coordination team member scheduled for:  07/05/22  Encounter Outcome:  Pt. Scheduled  [place]  Direct Dial: [phone]

## 2022-07-05 ENCOUNTER — Encounter: Payer: Medicare HMO | Admitting: Student

## 2022-07-05 ENCOUNTER — Ambulatory Visit: Payer: Self-pay

## 2022-07-06 ENCOUNTER — Telehealth: Payer: Self-pay | Admitting: Pulmonary Disease

## 2022-07-06 NOTE — Telephone Encounter (Signed)
She wants to see about re-scheduling her in lab sleep study. Please advise.

## 2022-07-06 NOTE — Patient Outreach (Signed)
  Care Coordination   Initial Visit Note   07/06/2022 Name: Alicia [name] MRN: [id number] DOB: November 10, 1951  Alicia [name] is a 71 y.o. year old female who sees Rick Duff, MD for primary care. I spoke with  Arta Bruce by phone today  What matters to the patients health and wellness today?  Monitor my CHF    Goals Addressed               This Visit's Progress     Monitor My CHF (pt-stated)        Care Coordination Interventions: Provided education on low sodium diet Reviewed Heart Failure Action Plan in depth  Advised patient to weigh each morning after emptying bladder Reviewed role of diuretics in prevention of fluid overload and management of heart failure; Discussed the importance of keeping all appointments with provider Active listening / Reflection utilized  Emotional Support Provided Will Send the patient educational material on CHF        SDOH assessments and interventions completed:  Yes  SDOH Interventions Today    Flowsheet Row Most Recent Value  SDOH Interventions   Food Insecurity Interventions Intervention Not Indicated  Housing Interventions Intervention Not Indicated  Transportation Interventions Intervention Not Indicated        Care Coordination Interventions Activated:  Yes  Care Coordination Interventions:  Yes, provided   Follow up plan: Follow up call scheduled for 8/29 @ 2 pm    Encounter Outcome:  Pt. Scheduled   Lazaro Arms RN, BSN, [facility]   Phone: [phone]

## 2022-07-06 NOTE — Patient Instructions (Signed)
Visit Information  Thank you for taking time to visit with me today. Please don't hesitate to contact me if I can be of assistance to you.   Following are the goals we discussed today:   Goals Addressed               This Visit's Progress     Monitor My CHF (pt-stated)        Care Coordination Interventions: Provided education on low sodium diet Reviewed Heart Failure Action Plan in depth  Advised patient to weigh each morning after emptying bladder Reviewed role of diuretics in prevention of fluid overload and management of heart failure; Discussed the importance of keeping all appointments with provider Active listening / Reflection utilized  Emotional Support Provided Will Send the patient educational material on CHF        Our next appointment is by telephone on 8/29  at 2 pm  Please call the care guide team at [phone] if you need to cancel or reschedule your appointment.    The patient verbalized understanding of instructions, educational materials, and care plan provided today.   Lazaro Arms RN, BSN, [facility]   Phone: [phone]

## 2022-07-06 NOTE — Telephone Encounter (Addendum)
I called [employer] & got her auth dates changed to 9/16-10/16.  Sleep study has been rescheduled to 9/17.  I called pt to give her appt info.  She states nothing to write with, can't get up to get anything and doesn't have vm for me to call back to leave appt info.  She asked me to call her dtr [name] & give her info.  Called [name] [phone] - and had to leave vm for her to call me back.

## 2022-07-12 ENCOUNTER — Ambulatory Visit: Payer: Medicare HMO | Admitting: Pulmonary Disease

## 2022-07-19 ENCOUNTER — Ambulatory Visit: Payer: Self-pay

## 2022-07-19 NOTE — Patient Outreach (Signed)
  Care Coordination   07/19/2022 Name: Alicia [name] MRN: [id number] DOB: July 15, 1951   Care Coordination Outreach Attempts:  An unsuccessful telephone outreach was attempted today to offer the patient information about available care coordination services as a benefit of their health plan.   Follow Up Plan:  Additional outreach attempts will be made to offer the patient care coordination information and services.   Encounter Outcome:  No Answer  Care Coordination Interventions Activated:  No   Care Coordination Interventions:  No, not indicated    Lazaro Arms RN, BSN, [place] [facility]   Phone: [phone]

## 2022-07-20 ENCOUNTER — Telehealth: Payer: Self-pay | Admitting: *Deleted

## 2022-07-20 NOTE — Telephone Encounter (Signed)
Received call from Katherine Mantle., RN with [facility]. Requesting VO for [facility] 2 week 1 and 1 week 4 to work on CHF management. Verbal auth given. Will route to Yellow Team for agreement/denial.

## 2022-07-22 ENCOUNTER — Telehealth: Payer: Self-pay | Admitting: *Deleted

## 2022-07-22 NOTE — Telephone Encounter (Signed)
Nothing at all. Thanks

## 2022-07-22 NOTE — Telephone Encounter (Signed)
Call from Gennaro Africa PT with [facility] requesting verbal orders for " HHPT once a week x 9 weeks and OT evaluation". Stated pt was recently discharged from the hospital. VO given - sending to doctor for approval or denial if not appropriate. Thanks

## 2022-07-26 ENCOUNTER — Ambulatory Visit: Payer: Self-pay

## 2022-07-26 NOTE — Patient Outreach (Signed)
  Care Coordination   07/26/2022 Name: Alicia [name] MRN: [id number] DOB: 05-19-1951   Care Coordination Outreach Attempts:  A second unsuccessful outreach was attempted today to offer the patient with information about available care coordination services as a benefit of their health plan.     Follow Up Plan:  Additional outreach attempts will be made to offer the patient care coordination information and services.   Encounter Outcome:  No Answer  Care Coordination Interventions Activated:  No   Care Coordination Interventions:  No, not indicated    Lazaro Arms RN, BSN, [place] [facility]   Phone: [phone]

## 2022-07-27 ENCOUNTER — Telehealth: Payer: Self-pay

## 2022-07-27 ENCOUNTER — Telehealth: Payer: Self-pay | Admitting: *Deleted

## 2022-07-27 NOTE — Telephone Encounter (Signed)
[place] from [facility] called requesting verbal orders [phone]

## 2022-07-27 NOTE — Telephone Encounter (Signed)
Received call from [place], [place] from [facility]. Requesting VO for HH OT 1 week 6 to work on independence with ADLs, decreasing fall risk, and increasing safety in the home. Verbal auth given. Will route to Yellow Team for agreement/denial.

## 2022-07-27 NOTE — Telephone Encounter (Signed)
Received call from [place], [place] with [facility] reporting BP 160/80. States patient has not yet taken AM BP meds as she was waiting till she had eaten.  Denies H/A, CP, SHOB. Has noted blurry vision x 2 days. Patient will take her medication now.

## 2022-07-27 NOTE — Telephone Encounter (Signed)
  Returned call [place]ella [place] from [employer] [facility]. No answer. Left message on VM requesting return call.

## 2022-08-07 ENCOUNTER — Ambulatory Visit (HOSPITAL_BASED_OUTPATIENT_CLINIC_OR_DEPARTMENT_OTHER): Payer: Medicare HMO | Admitting: Pulmonary Disease

## 2022-08-18 ENCOUNTER — Telehealth: Payer: Self-pay | Admitting: *Deleted

## 2022-08-18 NOTE — Telephone Encounter (Signed)
Thanks Gladys! 

## 2022-08-18 NOTE — Telephone Encounter (Signed)
Call from [place].  Patient's B/P today was 170 90.  Had not taken medication today.  Took while PT was there.  Patient has been complaining of Abdominal pain x 3 days with diarrhea.  Has been unable to eat.  States when she eats it comes back out.  States no fever or chills.  Has lots of gas when she eats.  .  Some nausea every now and then.  Did vomit a little this morning.  Getting in for appointment is difficult.  Bartholome Bill had an 8 lb weight loss .

## 2022-08-18 NOTE — Telephone Encounter (Signed)
RTC to patient given appointment for 08/24/2022 1:15 PM.  Patient states will try to get here.  To go to ER or Urgent if symptoms worsen.

## 2022-08-18 NOTE — Telephone Encounter (Signed)
Unfortunately we would need to evaluate the patient in person to figure out the cause and treatment of these new symptoms. Please try for an appointment tomorrow morning, if that is not possible we can try for next week.

## 2022-08-24 ENCOUNTER — Inpatient Hospital Stay (HOSPITAL_COMMUNITY)
Admission: EM | Admit: 2022-08-24 | Discharge: 2022-10-06 | DRG: 391 | Disposition: A | Discharge status: Hospice | Payer: Medicare HMO | Attending: Internal Medicine | Admitting: Internal Medicine

## 2022-08-24 ENCOUNTER — Emergency Department (HOSPITAL_COMMUNITY): Payer: Medicare HMO

## 2022-08-24 ENCOUNTER — Observation Stay (HOSPITAL_COMMUNITY): Payer: Medicare HMO

## 2022-08-24 ENCOUNTER — Encounter: Payer: Medicare HMO | Admitting: Family Medicine

## 2022-08-24 DIAGNOSIS — N184 Chronic kidney disease, stage 4 (severe): Secondary | ICD-10-CM | POA: Diagnosis not present

## 2022-08-24 DIAGNOSIS — M19011 Primary osteoarthritis, right shoulder: Secondary | ICD-10-CM | POA: Diagnosis present

## 2022-08-24 DIAGNOSIS — G8929 Other chronic pain: Secondary | ICD-10-CM | POA: Diagnosis not present

## 2022-08-24 DIAGNOSIS — D649 Anemia, unspecified: Secondary | ICD-10-CM | POA: Diagnosis not present

## 2022-08-24 DIAGNOSIS — E46 Unspecified protein-calorie malnutrition: Secondary | ICD-10-CM | POA: Diagnosis not present

## 2022-08-24 DIAGNOSIS — E871 Hypo-osmolality and hyponatremia: Secondary | ICD-10-CM | POA: Diagnosis present

## 2022-08-24 DIAGNOSIS — K529 Noninfective gastroenteritis and colitis, unspecified: Principal | ICD-10-CM | POA: Diagnosis present

## 2022-08-24 DIAGNOSIS — F17200 Nicotine dependence, unspecified, uncomplicated: Secondary | ICD-10-CM | POA: Diagnosis not present

## 2022-08-24 DIAGNOSIS — E861 Hypovolemia: Secondary | ICD-10-CM | POA: Diagnosis present

## 2022-08-24 DIAGNOSIS — R627 Adult failure to thrive: Secondary | ICD-10-CM | POA: Diagnosis present

## 2022-08-24 DIAGNOSIS — E8809 Other disorders of plasma-protein metabolism, not elsewhere classified: Secondary | ICD-10-CM | POA: Diagnosis present

## 2022-08-24 DIAGNOSIS — E669 Obesity, unspecified: Secondary | ICD-10-CM | POA: Diagnosis present

## 2022-08-24 DIAGNOSIS — M7989 Other specified soft tissue disorders: Secondary | ICD-10-CM | POA: Diagnosis not present

## 2022-08-24 DIAGNOSIS — Z8711 Personal history of peptic ulcer disease: Secondary | ICD-10-CM

## 2022-08-24 DIAGNOSIS — D631 Anemia in chronic kidney disease: Secondary | ICD-10-CM | POA: Diagnosis present

## 2022-08-24 DIAGNOSIS — R531 Weakness: Secondary | ICD-10-CM | POA: Diagnosis present

## 2022-08-24 DIAGNOSIS — Z8249 Family history of ischemic heart disease and other diseases of the circulatory system: Secondary | ICD-10-CM

## 2022-08-24 DIAGNOSIS — I351 Nonrheumatic aortic (valve) insufficiency: Secondary | ICD-10-CM | POA: Diagnosis present

## 2022-08-24 DIAGNOSIS — Z8673 Personal history of transient ischemic attack (TIA), and cerebral infarction without residual deficits: Secondary | ICD-10-CM

## 2022-08-24 DIAGNOSIS — B967 Clostridium perfringens [C. perfringens] as the cause of diseases classified elsewhere: Secondary | ICD-10-CM | POA: Diagnosis not present

## 2022-08-24 DIAGNOSIS — F5105 Insomnia due to other mental disorder: Secondary | ICD-10-CM | POA: Diagnosis present

## 2022-08-24 DIAGNOSIS — K2980 Duodenitis without bleeding: Secondary | ICD-10-CM | POA: Diagnosis not present

## 2022-08-24 DIAGNOSIS — E1122 Type 2 diabetes mellitus with diabetic chronic kidney disease: Secondary | ICD-10-CM | POA: Diagnosis present

## 2022-08-24 DIAGNOSIS — M1990 Unspecified osteoarthritis, unspecified site: Secondary | ICD-10-CM | POA: Diagnosis not present

## 2022-08-24 DIAGNOSIS — N390 Urinary tract infection, site not specified: Secondary | ICD-10-CM | POA: Diagnosis present

## 2022-08-24 DIAGNOSIS — Z6837 Body mass index (BMI) 37.0-37.9, adult: Secondary | ICD-10-CM

## 2022-08-24 DIAGNOSIS — I5032 Chronic diastolic (congestive) heart failure: Secondary | ICD-10-CM | POA: Diagnosis present

## 2022-08-24 DIAGNOSIS — I132 Hypertensive heart and chronic kidney disease with heart failure and with stage 5 chronic kidney disease, or end stage renal disease: Secondary | ICD-10-CM | POA: Diagnosis present

## 2022-08-24 DIAGNOSIS — Z66 Do not resuscitate: Secondary | ICD-10-CM | POA: Diagnosis present

## 2022-08-24 DIAGNOSIS — K8689 Other specified diseases of pancreas: Secondary | ICD-10-CM | POA: Diagnosis present

## 2022-08-24 DIAGNOSIS — F17210 Nicotine dependence, cigarettes, uncomplicated: Secondary | ICD-10-CM | POA: Diagnosis not present

## 2022-08-24 DIAGNOSIS — L97119 Non-pressure chronic ulcer of right thigh with unspecified severity: Secondary | ICD-10-CM | POA: Diagnosis present

## 2022-08-24 DIAGNOSIS — R112 Nausea with vomiting, unspecified: Secondary | ICD-10-CM

## 2022-08-24 DIAGNOSIS — R197 Diarrhea, unspecified: Secondary | ICD-10-CM

## 2022-08-24 DIAGNOSIS — L89151 Pressure ulcer of sacral region, stage 1: Secondary | ICD-10-CM | POA: Diagnosis present

## 2022-08-24 DIAGNOSIS — E43 Unspecified severe protein-calorie malnutrition: Secondary | ICD-10-CM | POA: Diagnosis present

## 2022-08-24 DIAGNOSIS — K269 Duodenal ulcer, unspecified as acute or chronic, without hemorrhage or perforation: Secondary | ICD-10-CM | POA: Diagnosis present

## 2022-08-24 DIAGNOSIS — K2289 Other specified disease of esophagus: Secondary | ICD-10-CM

## 2022-08-24 DIAGNOSIS — R52 Pain, unspecified: Secondary | ICD-10-CM | POA: Diagnosis not present

## 2022-08-24 DIAGNOSIS — R32 Unspecified urinary incontinence: Secondary | ICD-10-CM | POA: Diagnosis present

## 2022-08-24 DIAGNOSIS — R6339 Other feeding difficulties: Secondary | ICD-10-CM

## 2022-08-24 DIAGNOSIS — K319 Disease of stomach and duodenum, unspecified: Secondary | ICD-10-CM | POA: Diagnosis not present

## 2022-08-24 DIAGNOSIS — E860 Dehydration: Secondary | ICD-10-CM | POA: Diagnosis present

## 2022-08-24 DIAGNOSIS — R638 Other symptoms and signs concerning food and fluid intake: Secondary | ICD-10-CM | POA: Diagnosis not present

## 2022-08-24 DIAGNOSIS — K2950 Unspecified chronic gastritis without bleeding: Secondary | ICD-10-CM | POA: Diagnosis not present

## 2022-08-24 DIAGNOSIS — M19012 Primary osteoarthritis, left shoulder: Secondary | ICD-10-CM | POA: Diagnosis present

## 2022-08-24 DIAGNOSIS — M170 Bilateral primary osteoarthritis of knee: Secondary | ICD-10-CM | POA: Diagnosis present

## 2022-08-24 DIAGNOSIS — I700 Atherosclerosis of aorta: Secondary | ICD-10-CM | POA: Diagnosis present

## 2022-08-24 DIAGNOSIS — R0609 Other forms of dyspnea: Secondary | ICD-10-CM | POA: Diagnosis not present

## 2022-08-24 DIAGNOSIS — R945 Abnormal results of liver function studies: Secondary | ICD-10-CM | POA: Diagnosis not present

## 2022-08-24 DIAGNOSIS — Z7984 Long term (current) use of oral hypoglycemic drugs: Secondary | ICD-10-CM

## 2022-08-24 DIAGNOSIS — R0602 Shortness of breath: Secondary | ICD-10-CM | POA: Diagnosis not present

## 2022-08-24 DIAGNOSIS — G9340 Encephalopathy, unspecified: Secondary | ICD-10-CM | POA: Diagnosis not present

## 2022-08-24 DIAGNOSIS — K5730 Diverticulosis of large intestine without perforation or abscess without bleeding: Secondary | ICD-10-CM | POA: Diagnosis not present

## 2022-08-24 DIAGNOSIS — E11649 Type 2 diabetes mellitus with hypoglycemia without coma: Secondary | ICD-10-CM | POA: Diagnosis not present

## 2022-08-24 DIAGNOSIS — Z91040 Latex allergy status: Secondary | ICD-10-CM

## 2022-08-24 DIAGNOSIS — R1084 Generalized abdominal pain: Secondary | ICD-10-CM | POA: Diagnosis not present

## 2022-08-24 DIAGNOSIS — K3184 Gastroparesis: Secondary | ICD-10-CM | POA: Diagnosis present

## 2022-08-24 DIAGNOSIS — Z515 Encounter for palliative care: Secondary | ICD-10-CM | POA: Diagnosis not present

## 2022-08-24 DIAGNOSIS — E876 Hypokalemia: Secondary | ICD-10-CM | POA: Diagnosis present

## 2022-08-24 DIAGNOSIS — K551 Chronic vascular disorders of intestine: Secondary | ICD-10-CM | POA: Diagnosis not present

## 2022-08-24 DIAGNOSIS — R0683 Snoring: Secondary | ICD-10-CM | POA: Diagnosis not present

## 2022-08-24 DIAGNOSIS — K439 Ventral hernia without obstruction or gangrene: Secondary | ICD-10-CM | POA: Diagnosis not present

## 2022-08-24 DIAGNOSIS — I130 Hypertensive heart and chronic kidney disease with heart failure and stage 1 through stage 4 chronic kidney disease, or unspecified chronic kidney disease: Secondary | ICD-10-CM | POA: Diagnosis not present

## 2022-08-24 DIAGNOSIS — Z888 Allergy status to other drugs, medicaments and biological substances status: Secondary | ICD-10-CM

## 2022-08-24 DIAGNOSIS — Z6841 Body Mass Index (BMI) 40.0 and over, adult: Secondary | ICD-10-CM

## 2022-08-24 DIAGNOSIS — I10 Essential (primary) hypertension: Secondary | ICD-10-CM | POA: Diagnosis not present

## 2022-08-24 DIAGNOSIS — K429 Umbilical hernia without obstruction or gangrene: Secondary | ICD-10-CM | POA: Diagnosis present

## 2022-08-24 DIAGNOSIS — R339 Retention of urine, unspecified: Secondary | ICD-10-CM | POA: Diagnosis not present

## 2022-08-24 DIAGNOSIS — N185 Chronic kidney disease, stage 5: Secondary | ICD-10-CM | POA: Diagnosis present

## 2022-08-24 DIAGNOSIS — K449 Diaphragmatic hernia without obstruction or gangrene: Secondary | ICD-10-CM | POA: Diagnosis not present

## 2022-08-24 DIAGNOSIS — Z91148 Patient's other noncompliance with medication regimen for other reason: Secondary | ICD-10-CM

## 2022-08-24 DIAGNOSIS — K3189 Other diseases of stomach and duodenum: Secondary | ICD-10-CM | POA: Diagnosis not present

## 2022-08-24 DIAGNOSIS — Z9981 Dependence on supplemental oxygen: Secondary | ICD-10-CM

## 2022-08-24 DIAGNOSIS — E1143 Type 2 diabetes mellitus with diabetic autonomic (poly)neuropathy: Secondary | ICD-10-CM | POA: Diagnosis present

## 2022-08-24 DIAGNOSIS — L97129 Non-pressure chronic ulcer of left thigh with unspecified severity: Secondary | ICD-10-CM | POA: Diagnosis present

## 2022-08-24 DIAGNOSIS — F321 Major depressive disorder, single episode, moderate: Secondary | ICD-10-CM | POA: Diagnosis present

## 2022-08-24 DIAGNOSIS — Z79899 Other long term (current) drug therapy: Secondary | ICD-10-CM

## 2022-08-24 DIAGNOSIS — E559 Vitamin D deficiency, unspecified: Secondary | ICD-10-CM | POA: Diagnosis present

## 2022-08-24 DIAGNOSIS — I5030 Unspecified diastolic (congestive) heart failure: Secondary | ICD-10-CM | POA: Diagnosis not present

## 2022-08-24 DIAGNOSIS — K219 Gastro-esophageal reflux disease without esophagitis: Secondary | ICD-10-CM | POA: Diagnosis present

## 2022-08-24 DIAGNOSIS — M79601 Pain in right arm: Secondary | ICD-10-CM | POA: Diagnosis not present

## 2022-08-24 DIAGNOSIS — N179 Acute kidney failure, unspecified: Secondary | ICD-10-CM | POA: Diagnosis not present

## 2022-08-24 DIAGNOSIS — Z8719 Personal history of other diseases of the digestive system: Secondary | ICD-10-CM

## 2022-08-24 DIAGNOSIS — L8990 Pressure ulcer of unspecified site, unspecified stage: Secondary | ICD-10-CM | POA: Insufficient documentation

## 2022-08-24 DIAGNOSIS — Z4682 Encounter for fitting and adjustment of non-vascular catheter: Secondary | ICD-10-CM | POA: Diagnosis not present

## 2022-08-24 DIAGNOSIS — R140 Abdominal distension (gaseous): Secondary | ICD-10-CM | POA: Diagnosis not present

## 2022-08-24 DIAGNOSIS — E875 Hyperkalemia: Secondary | ICD-10-CM | POA: Diagnosis present

## 2022-08-24 DIAGNOSIS — E1110 Type 2 diabetes mellitus with ketoacidosis without coma: Secondary | ICD-10-CM | POA: Diagnosis not present

## 2022-08-24 DIAGNOSIS — I509 Heart failure, unspecified: Secondary | ICD-10-CM | POA: Diagnosis not present

## 2022-08-24 DIAGNOSIS — K2990 Gastroduodenitis, unspecified, without bleeding: Secondary | ICD-10-CM | POA: Diagnosis not present

## 2022-08-24 DIAGNOSIS — E785 Hyperlipidemia, unspecified: Secondary | ICD-10-CM | POA: Diagnosis present

## 2022-08-24 DIAGNOSIS — K5900 Constipation, unspecified: Secondary | ICD-10-CM | POA: Diagnosis not present

## 2022-08-24 DIAGNOSIS — F32A Depression, unspecified: Secondary | ICD-10-CM | POA: Diagnosis not present

## 2022-08-24 DIAGNOSIS — I110 Hypertensive heart disease with heart failure: Secondary | ICD-10-CM | POA: Diagnosis not present

## 2022-08-24 DIAGNOSIS — M47816 Spondylosis without myelopathy or radiculopathy, lumbar region: Secondary | ICD-10-CM | POA: Diagnosis not present

## 2022-08-24 DIAGNOSIS — R109 Unspecified abdominal pain: Secondary | ICD-10-CM | POA: Diagnosis not present

## 2022-08-24 DIAGNOSIS — K2970 Gastritis, unspecified, without bleeding: Secondary | ICD-10-CM | POA: Diagnosis not present

## 2022-08-24 LAB — URINALYSIS, ROUTINE W REFLEX MICROSCOPIC
Bilirubin Urine: NEGATIVE
Glucose, UA: NEGATIVE mg/dL
Hgb urine dipstick: NEGATIVE
Ketones, ur: NEGATIVE mg/dL
Leukocytes,Ua: NEGATIVE
Nitrite: NEGATIVE
Protein, ur: 100 mg/dL — AB
Specific Gravity, Urine: 1.028 (ref 1.005–1.030)
pH: 5 (ref 5.0–8.0)

## 2022-08-24 LAB — COMPREHENSIVE METABOLIC PANEL
ALT: 15 U/L (ref 0–44)
AST: 19 U/L (ref 15–41)
Albumin: 2.5 g/dL — ABNORMAL LOW (ref 3.5–5.0)
Alkaline Phosphatase: 102 U/L (ref 38–126)
Anion gap: 11 (ref 5–15)
BUN: 31 mg/dL — ABNORMAL HIGH (ref 8–23)
CO2: 20 mmol/L — ABNORMAL LOW (ref 22–32)
Calcium: 7.9 mg/dL — ABNORMAL LOW (ref 8.9–10.3)
Chloride: 105 mmol/L (ref 98–111)
Creatinine, Ser: 2.42 mg/dL — ABNORMAL HIGH (ref 0.44–1.00)
GFR, Estimated: 21 mL/min — ABNORMAL LOW (ref >60)
Glucose, Bld: 157 mg/dL — ABNORMAL HIGH (ref 70–99)
Potassium: 3.4 mmol/L — ABNORMAL LOW (ref 3.5–5.1)
Sodium: 136 mmol/L (ref 135–145)
Total Bilirubin: 0.6 mg/dL (ref 0.3–1.2)
Total Protein: 5.3 g/dL — ABNORMAL LOW (ref 6.5–8.1)

## 2022-08-24 LAB — CBC WITH DIFFERENTIAL/PLATELET
Abs Immature Granulocytes: 0.02 10*3/uL (ref 0.00–0.07)
Basophils Absolute: 0 10*3/uL (ref 0.0–0.1)
Basophils Relative: 0 %
Eosinophils Absolute: 0 10*3/uL (ref 0.0–0.5)
Eosinophils Relative: 0 %
HCT: 35.4 % — ABNORMAL LOW (ref 36.0–46.0)
Hemoglobin: 11.7 g/dL — ABNORMAL LOW (ref 12.0–15.0)
Immature Granulocytes: 0 %
Lymphocytes Relative: 11 %
Lymphs Abs: 0.6 10*3/uL — ABNORMAL LOW (ref 0.7–4.0)
MCH: 31.5 pg (ref 26.0–34.0)
MCHC: 33.1 g/dL (ref 30.0–36.0)
MCV: 95.4 fL (ref 80.0–100.0)
Monocytes Absolute: 0.4 10*3/uL (ref 0.1–1.0)
Monocytes Relative: 6 %
Neutro Abs: 4.7 10*3/uL (ref 1.7–7.7)
Neutrophils Relative %: 83 %
Platelets: 262 10*3/uL (ref 150–400)
RBC: 3.71 MIL/uL — ABNORMAL LOW (ref 3.87–5.11)
RDW: 15.4 % (ref 11.5–15.5)
WBC: 5.7 10*3/uL (ref 4.0–10.5)
nRBC: 0 % (ref 0.0–0.2)

## 2022-08-24 LAB — BRAIN NATRIURETIC PEPTIDE: B Natriuretic Peptide: 298.4 pg/mL — ABNORMAL HIGH (ref 0.0–100.0)

## 2022-08-24 LAB — CBG MONITORING, ED: Glucose-Capillary: 154 mg/dL — ABNORMAL HIGH (ref 70–99)

## 2022-08-24 MED ORDER — DICLOFENAC SODIUM 1 % EX GEL
4.0000 g | Freq: Four times a day (QID) | CUTANEOUS | Status: DC
  Administered 2022-08-25 – 2022-09-08 (×19): 4 g via TOPICAL
  Filled 2022-08-24: qty 100

## 2022-08-24 MED ORDER — ACETAMINOPHEN 325 MG PO TABS
650.0000 mg | ORAL_TABLET | Freq: Four times a day (QID) | ORAL | Status: DC | PRN
  Administered 2022-08-25 – 2022-08-27 (×4): 650 mg via ORAL
  Filled 2022-08-24 (×4): qty 2

## 2022-08-24 MED ORDER — INSULIN ASPART 100 UNIT/ML IJ SOLN
0.0000 [IU] | Freq: Three times a day (TID) | INTRAMUSCULAR | Status: DC
  Administered 2022-08-28: 2 [IU] via SUBCUTANEOUS

## 2022-08-24 MED ORDER — SODIUM CHLORIDE 0.9 % IV BOLUS
500.0000 mL | Freq: Once | INTRAVENOUS | Status: AC
  Administered 2022-08-24: 500 mL via INTRAVENOUS

## 2022-08-24 MED ORDER — OXYCODONE HCL 5 MG PO TABS
5.0000 mg | ORAL_TABLET | Freq: Four times a day (QID) | ORAL | Status: DC | PRN
  Administered 2022-08-25 – 2022-08-28 (×8): 5 mg via ORAL
  Filled 2022-08-24 (×8): qty 1

## 2022-08-24 MED ORDER — LACTATED RINGERS IV SOLN: INTRAVENOUS | Status: AC

## 2022-08-24 MED ORDER — ALUM & MAG HYDROXIDE-SIMETH 200-200-20 MG/5ML PO SUSP
30.0000 mL | Freq: Once | ORAL | Status: AC
  Administered 2022-08-24: 30 mL via ORAL
  Filled 2022-08-24: qty 30

## 2022-08-24 MED ORDER — LACTATED RINGERS IV BOLUS
1000.0000 mL | Freq: Once | INTRAVENOUS | Status: AC
  Administered 2022-08-24: 1000 mL via INTRAVENOUS

## 2022-08-24 MED ORDER — METOPROLOL SUCCINATE ER 100 MG PO TB24
100.0000 mg | ORAL_TABLET | Freq: Every day | ORAL | Status: DC
  Administered 2022-08-24 – 2022-09-07 (×15): 100 mg via ORAL
  Filled 2022-08-24 (×11): qty 1
  Filled 2022-08-24: qty 4
  Filled 2022-08-24 (×3): qty 1

## 2022-08-24 MED ORDER — SENNOSIDES-DOCUSATE SODIUM 8.6-50 MG PO TABS: 1.0000 | ORAL_TABLET | Freq: Every evening | ORAL | Status: DC | PRN

## 2022-08-24 MED ORDER — AMLODIPINE BESYLATE 10 MG PO TABS
10.0000 mg | ORAL_TABLET | Freq: Every day | ORAL | Status: DC
  Administered 2022-08-25 – 2022-09-07 (×14): 10 mg via ORAL
  Filled 2022-08-24: qty 1
  Filled 2022-08-24: qty 2
  Filled 2022-08-24 (×12): qty 1

## 2022-08-24 MED ORDER — ENSURE ENLIVE PO LIQD
237.0000 mL | Freq: Two times a day (BID) | ORAL | Status: DC
  Administered 2022-08-25 – 2022-09-05 (×4): 237 mL via ORAL
  Filled 2022-08-24 (×2): qty 237

## 2022-08-24 MED ORDER — ENOXAPARIN SODIUM 30 MG/0.3ML IJ SOSY
30.0000 mg | PREFILLED_SYRINGE | INTRAMUSCULAR | Status: DC
  Administered 2022-08-24 – 2022-08-25 (×2): 30 mg via SUBCUTANEOUS
  Filled 2022-08-24 (×2): qty 0.3

## 2022-08-24 MED ORDER — HYDROMORPHONE HCL 1 MG/ML IJ SOLN
1.0000 mg | Freq: Once | INTRAMUSCULAR | Status: AC
  Administered 2022-08-24: 1 mg via INTRAVENOUS
  Filled 2022-08-24: qty 1

## 2022-08-24 MED ORDER — METOCLOPRAMIDE HCL 5 MG/ML IJ SOLN
5.0000 mg | Freq: Once | INTRAMUSCULAR | Status: AC
  Administered 2022-08-24: 5 mg via INTRAVENOUS
  Filled 2022-08-24: qty 2

## 2022-08-24 MED ORDER — ROSUVASTATIN CALCIUM 5 MG PO TABS
10.0000 mg | ORAL_TABLET | Freq: Every evening | ORAL | Status: DC
  Administered 2022-08-25 – 2022-09-06 (×13): 10 mg via ORAL
  Filled 2022-08-24 (×14): qty 2

## 2022-08-24 MED ORDER — MORPHINE SULFATE (PF) 4 MG/ML IV SOLN
4.0000 mg | Freq: Once | INTRAVENOUS | Status: AC
  Administered 2022-08-24: 4 mg via INTRAVENOUS
  Filled 2022-08-24: qty 1

## 2022-08-24 MED ORDER — ACETAMINOPHEN 650 MG RE SUPP: 650.0000 mg | Freq: Four times a day (QID) | RECTAL | Status: DC | PRN

## 2022-08-24 MED ORDER — PANTOPRAZOLE SODIUM 40 MG PO TBEC
40.0000 mg | DELAYED_RELEASE_TABLET | Freq: Every day | ORAL | Status: DC
  Administered 2022-08-24 – 2022-08-31 (×8): 40 mg via ORAL
  Filled 2022-08-24 (×9): qty 1

## 2022-08-24 NOTE — H&P (Signed)
Date: 08/24/2022               [name] MRN: [id number]  DOB: 1951/09/29 Age / Sex: 71 y.o., female   PCP: Rick Duff, MD         Medical Service: Internal Medicine Teaching Service         Attending Physician: : Dr. Philipp Ovens    First Contact: Drucie Opitz, MD    Pager: [phone]    Second Contact: Rick Duff, MD    Pager: [phone]         After Hours (After 5p/  First Contact Pager: [phone]  weekends / holidays): Second Contact Pager: [phone]   SUBJECTIVE   Chief Complaint: weakness  History of Present Illness:  [name] is a 71yo person living with a history of HFpEF, HTN, DM, CKD4, PAD, prior CVA, vitamin D deficiency, presenting to the ED via EMS for weakness   Patient reports she called the EMS because she feels sick and is tired of feeling sick. Reports that her weakness has been chronic, she is mostly bed bound with minimal ambulation at baseline. However, for she's felt it has been worse for the past month. When she tries to get up from bed, she does not have enough strength to stay up. She feels tired all the time. There is no pain in her muscles per say, it just weakness. She has been working with [facility] PT intermittently but it has been difficult 2/2 weakness. Denies numbness, tingling at this time.  Patient has also had trouble eating. She has not had an appetite for weeks. She has not had a full meal in 4-5 days. She now sips of Ensure and ~1 Pepsi per day. She does not drink much water. Of note, patient states she states food "just goes through me" . Once she eats she gets loose stools This has been going on for more than 2-3 months. The loose nonly comes with food. She endorses lightheadedness in the past and today.  [name] endorses constant epigastric and L sided abdominal pain. The epigastric pain gets better with food but returns soon thereafter. This has progressively gotten worse. She has not tried  antiacid medications or currently taking any. Her L sided abdominal pain has been present for a couple of weeks, it is diffuse, constant and doesn't radiate.  Patient denies sick contacts, fever, chills, night sweats, blood in urine or per rectum. Patient denies chest pain, shortness of breath or headaches.  ED Course: bPatient with diffuse abdominal tenderness. CBC without leukocytosis, Hgb 11.1 and Cr 2.42, GFR 21, albumin 2.5, all improved from prior. BNP 298.4, CXR without signs of pulmonary edema or focal consolidation. U/A, unremarkable. Patient refused food and ambulation. ED TOC social work was consulted as patient lives alone with intermittent help from [place]. Patient was not deemed safe for discharge home and is being admitted to internal medicine for further work up and for PT evaluation for potential SNF placement.   Meds:   Patient endorses taking all medications as prescribed except she has been out of her rosuvastatin She does not take famotidine  Takes Tylenol 500 mg Applies Voltaren gel to joints Amlodipine 10 mg Metoprolol 100 mg daily Rosuvastatin 10 mg daily Sitagliptin 25 mg daily Furosemide '40mg'$     Past Medical History  HFpEF HTN DM CKD4 PAD prior CVA vitamin D deficiency Medication non-adherence  Past Surgical History:  Procedure Laterality Date   ABDOMINAL AORTOGRAM  W/LOWER EXTREMITY N/A 12/17/2018   Procedure: ABDOMINAL AORTOGRAM W/LOWER EXTREMITY;  Surgeon: Waynetta Sandy, MD;  Location: [employer] [facility];  Service: Cardiovascular;  Laterality: N/A;   ARTERY BIOPSY Left 03/17/2022   Procedure: BIOPSY TEMPORAL ARTERY;  Surgeon: Cherre Robins, MD;  Location: [place];  Service: Vascular;  Laterality: Left;   FEMORAL BYPASS  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 Bridgett Larsson   PERIPHERAL VASCULAR ATHERECTOMY Left 12/17/2018   Procedure: PERIPHERAL VASCULAR ATHERECTOMY;  Surgeon: Waynetta Sandy, MD;   Location: [facility] CV LAB;  Service: Cardiovascular;  Laterality: Left;   PERIPHERAL VASCULAR BALLOON ANGIOPLASTY Left 12/17/2018   Procedure: PERIPHERAL VASCULAR BALLOON ANGIOPLASTY;  Surgeon: Waynetta Sandy, MD;  Location: [facility] CV LAB;  Service: Cardiovascular;  Laterality: Left;    Social:  Lives by herself in an apartment in [place] Occupation: Retired Support: Has aide that comes to her house 4 times a week for a few hours in the afternoon to help her. Neighbor helps her sometimes to get her to appointments or to pharmacy. Reports it has been difficult to get to pharmacy lately and has ran out of medications. She would like to get them delivered if possible.  Level of Function: Patient is able to feed herself and take her own medications, but she is unable to bathe, move, or use commode, requiring briefs for the past 6 months. She is dependent on her neighbor and [facility] aide for food preparation, driving, and obtaining from other rooms in her house. Currently mostly bed-bound. PCP: Rick Duff, MD Substances: Current smoker, attempting to quit. No other drug use.  Family History: No family history of colon cancer  Allergies: Allergies as of 08/24/2022 - Review Complete 08/24/2022  Allergen Reaction Noted   Nifedipine er Other (See Comments) 04/03/2018   Atorvastatin Swelling 04/22/2019   Latex Rash 04/03/2018    Review of Systems: A complete ROS was negative except as per HPI.   OBJECTIVE:   Physical Exam: Blood pressure (!) 163/93, pulse 73, temperature 98.2 F (36.8 C), temperature source Oral, resp. rate 10, height '5\' 8"'$  (1.727 m), weight 110 kg, SpO2 97 %.  Constitutional: tired -appearing woman with temporal wasting, laying in bed, in no acute distress HENT: normocephalic atraumatic,dry mucous membranes Cardiovascular: regular rate and rhythm, no m/r/g, No JVD Pulmonary/Chest: normal work of breathing on room air, lungs clear to auscultation  bilaterally. No crackles  Abdominal: Bowel sounds present. Abdomen was soft with epigastric and L quadrant tenderness to light and deep palpation without guarding or rebound tenderness. No fluid wave  Neurological: alert & oriented x 3. 4/5 strength in bilateral upper extremities. 3/5 strength in bilateral distal lower extremities. 3-4/5 in proximal bilateral lower extremities. Light touch present and symmetrical. Patient was able to hold herself up while seated in bed.  MSK: no gross abnormalities. No point tenderness on spine. Mild pain on R shoulder and L knee with movement. No edema around joints noted. No pitting edema Skin: warm and dry Psych: Normal mood and affect  Labs: CBC    Component Value Date/Time   WBC 5.7 08/24/2022 1425   RBC 3.71 (L) 08/24/2022 1425   HGB 11.7 (L) 08/24/2022 1425   HGB 13.8 05/19/2022 1151   HCT 35.4 (L) 08/24/2022 1425   HCT 40.6 05/19/2022 1151   PLT 262 08/24/2022 1425   PLT 296 05/19/2022 1151   MCV 95.4 08/24/2022 1425   MCV 86 05/19/2022 1151  MCH 31.5 08/24/2022 1425   MCHC 33.1 08/24/2022 1425   RDW 15.4 08/24/2022 1425   RDW 16.3 (H) 05/19/2022 1151   LYMPHSABS 0.6 (L) 08/24/2022 1425   LYMPHSABS 0.8 02/16/2022 1000   MONOABS 0.4 08/24/2022 1425   EOSABS 0.0 08/24/2022 1425   EOSABS 0.1 02/16/2022 1000   BASOSABS 0.0 08/24/2022 1425   BASOSABS 0.0 02/16/2022 1000     CMP     Component Value Date/Time   NA 136 08/24/2022 1425   NA 133 (L) 05/19/2022 1151   K 3.4 (L) 08/24/2022 1425   CL 105 08/24/2022 1425   CO2 20 (L) 08/24/2022 1425   GLUCOSE 157 (H) 08/24/2022 1425   BUN 31 (H) 08/24/2022 1425   BUN 84 (HH) 05/19/2022 1151   CREATININE 2.42 (H) 08/24/2022 1425   CALCIUM 7.9 (L) 08/24/2022 1425   PROT 5.3 (L) 08/24/2022 1425   PROT 7.3 05/19/2022 1151   ALBUMIN 2.5 (L) 08/24/2022 1425   ALBUMIN 3.5 (L) 05/19/2022 1151   AST 19 08/24/2022 1425   ALT 15 08/24/2022 1425   ALKPHOS 102 08/24/2022 1425   BILITOT 0.6  08/24/2022 1425   BILITOT 0.4 05/19/2022 1151   GFRNONAA 21 (L) 08/24/2022 1425   GFRAA 35 (L) 06/15/2020 1214    Imaging: DG Chest Port 1 View  Result Date: 08/24/2022 CLINICAL DATA:  Shortness of breath EXAM: PORTABLE CHEST 1 VIEW COMPARISON:  06/30/2022 FINDINGS: Transverse diameter of heart is increased. There are no signs of pulmonary edema or new focal infiltrates. There is no pleural effusion or pneumothorax. Degenerative changes are noted in both shoulders, more severe on the right side. IMPRESSION: There are no signs of pulmonary edema or focal pulmonary consolidation. Electronically Signed   By: Elmer Picker M.D.   On: 08/24/2022 14:37      EKG: personally reviewed my interpretation is normal sinus rhythm with LVH without acute signs of ischemia. Prior EKGw with similar findings  ASSESSMENT & PLAN:   Assessment & Plan by Problem: Principal Problem:   Generalized weakness   [name] is a 71yo person living with a history of HFpEF, HTN, DM, CKD4, PAD, prior CVA, vitamin D deficiency, presenting to the ED via EMS for weakness admitted for weakness work up and discharge disposition  Weakness Deconditioning Anemia, normocytic PT with history of bilateral knee osteoarthritis with chronic bilateral lower extremity weakness with deconditioning that has rendered her unable to take care of self. Patient has worked with PT in the past, often suggesting SNF with rehab, which the patient has refused. She is now presenting with concerns of not being able to care for self,  increased fatigue and overall weakness. Admission CBC without concern for infection or worsening anemia (likely in setting of chronic disease). No evidence of worsening kidney function or HF. Patient physical exam without acute concerns for neurological events. Patient currently endorsing decrease PO intake and poor appetite, likely contributing to her overall fatigue picture.  Most recent TSH and B12 on  6/30 wnl. At the time, she was vitamin D deficient, however, she has not had supplementation. Will repeat TSH, vit D, and B12 levels as well as obtain an iron panel to work up reversible contributory causes to her generalized weakness. Will also start work up for decreased PO intake and abdominal pain (below) to ensure better caloric intake -TSH, vitamin B12, Vitamin D , pending -Iron panel and ferritin, pending -PT/OT consulted -RD, consulted -CTM CBC  Abdominal pain Poor appetite Patient's epigastric  pain is concerning for gastritis vs PUD vs pancreatitis. Gastritis is higher on my differential given history of gastritis on EGD in 2020 and multiple episodes similar pain at prior hospital admissions that resolves with GI cocktails/short courses of PPIs since then. Patient is  not consistent with therapy at home. She also endorses drinking coffee and soda every day, which could contribute. Her CMP was wnl, thus biliary causes are lower on my differential. Patient also endorses L sided abdominal pain that could also be in the setting of pancreatitis vs colitis vs kidney stones. U/A was unremarkable and patient without CVA or suprapubic tenderness. Will start work up with lipase and CT abdomen and pelvis. Pending results, would also consider H pylori work up as patient was diagnosed in 2020 and it is unclear whether she completed full treatment. Would also consider fecal elastase as she is reporting loose stools with meals and has a history of DM. -Lipase, pending -CT A/P, pending -Consider H pylori testing -Consider fecal elastase testing -Started on pantoprazole 40 mg tablet -s/p Maalox/Mylanta  Hypertension HLD Patient hypertensive on admission. Home therapies include amlodipine 10 mg and metoprolol. Previously on spironolactone as well but this was d/c'd after 05/2022 in setting of AKI on CKD4. Current Cr. 2.42, which is patient's baseline. -Restarted on amlodipine 10 mg and metoprolol 100  mg -Restarted on Crestor 10 mg  Depression Insomnia PHQ9 today 14, worse than prior hospitalization. Patient has also been grieving the loss of one of her daughters for the past year. The symptoms that bother the patient the most are not being able to sleep as well and lack of appetite. Patient has other potential reasons for lack of appetite addressed above. If GI cocktail and PPI do not improve patient's oral intake, consider starting patient on Mirtazapine to improve sleep, appetite, and depressive symptoms  Social determinants of health Patient lives alone and does not have reliable support in the area. Her family live in [place] and [place]. At prior hospitalization, she was introduced to [facility] program but it is unclear whether she has applied. She is currently requiring more care than she is able to access with [facility] aide and [place] PT. She has not been able to make it to appointments for lack of transportation.  -PT/OT consulted -Follow up with [facility] program  HFpEF class II/Stage C 02/2022 ECHo 55-60%. Difficult to assess volume status given body habitus, but overall patient seems hypo/euvolemic on exam. BNP ~280 without signs of vascular congestion on CXR. Patient is currently 100% on RA. -Encourage incentive spirometer -Reassess volume status and consider restarting home Lasix 40 mg  CKD4 Vitamin D deficiency Anemia of chronic disease Thought to be in the setting of her hx of uncontrolled HTN. Hbg today 11.2. -CTM renal function -CTM CBC -vit D level, pending  Chronic pain OA bilateral shoulders and knees -Tylenol 650 mg q6HR for mild pain -Oxycodone '5mg'$  q6HR for moderate to severe pain  DM 06/16/2022 A1c 6.2.  -SSI  Diet: Normal VTE: Heparin Code: Full  Prior to Admission Living Arrangement: Home, living alone Anticipated Discharge Location:  pending PT and OT evaluation Barriers to Discharge: Clinical improvement and disposition  Dispo: Admit patient to Observation with expected  length of stay less than 2 midnights.  Signed:  Romana Juniper, MD Internal Medicine Resident PGY-1 08/24/2022, 11:23 PM

## 2022-08-24 NOTE — Care Management (Addendum)
ED RNCM discussed patient with EDP.  Patient presented to the ED c/o generalized increased weakness. She reports being unable to feed herself and has not taken her meds in 4 days.  Patient lives alone, but does have an aide who comes in a couple of ours per day. Patient is active at the [facility]. Patient was at [facility] 5/23 for deconditioning.  EDP consulting with Internal Medicine

## 2022-08-24 NOTE — ED Triage Notes (Signed)
Pt arrived via [facility] for cc of failure to thrive and weakness. Pt lives alone and is no longer able to care for herself. Pt reports increasing fatigue, non-ambulatory, unable to feed herself, has not taken medication in 4 days. Pt reports neighbor comes to help her at times and an aid comes to help a few times a week, but help is not sufficient for her needs. Pt presents as lethargic, but is able to answer all questions appropriately. GCS 15, A&Ox4.   BP 168/98 SPO2 100 SPO2 95% RR 16 CBG 205

## 2022-08-24 NOTE — ED Provider Notes (Signed)
[facility] EMERGENCY DEPARTMENT Provider Note   CSN: [id number] Arrival date & time: 08/24/22  1329     History  Chief Complaint  Patient presents with   Weakness    Alicia [name] is a 71 y.o. female.  With a history of CHF, hypertension, prior CVA, type 2 diabetes, stage IV CKD, vitamin D deficiency, PAD, depression who presents to the ED via EMS for evaluation of failure to thrive.  Patient states she has felt progressively weak for the past 4 days, and has been unable to leave her bed for the past 2 days.  She also states that she has not eaten anything over the last 4 days because she does not feel like eating.  States she has a movable commode that she uses in her bedroom to go to the bathroom.  She has a home health aide visit her a couple times per week, with the most recent visit being 2 days ago.  She reports that the home health aide requested that she presents to the hospital for evaluation of her symptoms, however patient did not want to at that time.  Patient also has a neighbor who visits her occasionally.  This neighbors called EMS today.  Patient currently complaining of generalized weakness and abdominal pain.  When asked to describe her pain, she states that she cannot and that it "just feels like pain."Patient also complaining of increased shortness of breath over the past 4 days.  Denies cough.  She says she has not taken her medications over the past 2 days because she has not been eating.  Denies fevers, chills, chest pain, nausea, vomiting, diarrhea, numbness, tingling, vision changes, headache, dysuria, frequency, urgency.  Weakness Associated symptoms: abdominal pain        Home Medications Prior to Admission medications   Medication Sig Start Date End Date Taking? Authorizing Provider  acetaminophen (TYLENOL) 500 MG tablet Take 2 tablets (1,000 mg total) by mouth every 6 (six) hours. 05/25/22   Romana Juniper, MD  albuterol  (VENTOLIN HFA) 108 (90 Base) MCG/ACT inhaler Inhale 2 puffs into the lungs every 6 (six) hours as needed for wheezing or shortness of breath. 12/10/21   Rick Duff, MD  amLODipine (NORVASC) 10 MG tablet Take 1 tablet (10 mg total) by mouth daily. 05/19/22 05/19/23  Lacinda Axon, MD  diclofenac Sodium (VOLTAREN) 1 % GEL Apply 4 g topically 4 (four) times daily. Apply to both knees. 05/25/22   Romana Juniper, MD  famotidine (PEPCID) 20 MG tablet Take 20 mg by mouth daily. 05/14/22   [provider]  furosemide (LASIX) 40 MG tablet Take 1 tablet (40 mg total) by mouth daily. 07/01/22 07/01/23  Riesa Pope, MD  metoprolol succinate (TOPROL-XL) 100 MG 24 hr tablet Take 1 tablet (100 mg total) by mouth daily. Take with or immediately following a meal. Patient taking differently: Take 100 mg by mouth every evening. Take with or immediately following a meal. 05/19/22   Amponsah, Charisse March, MD  OXYGEN Inhale 2 L into the lungs daily as needed (oxygen).    [provider]  rosuvastatin (CRESTOR) 10 MG tablet Take 1 tablet (10 mg total) by mouth every evening. 05/19/22   Lacinda Axon, MD  sitaGLIPtin (JANUVIA) 25 MG tablet Take 1 tablet (25 mg total) by mouth daily. 06/16/22   Masters, Joellen Jersey, DO      Allergies    Nifedipine er, Atorvastatin, and Latex    Review of Systems  Review of Systems  Gastrointestinal:  Positive for abdominal pain.  Neurological:  Positive for weakness.  All other systems reviewed and are negative.   Physical Exam Updated Vital Signs BP (!) 154/96   Pulse 82   Temp 98.2 F (36.8 C)   Resp (!) 32   SpO2 100%  Physical Exam Vitals and nursing note reviewed.  Constitutional:      General: She is not in acute distress.    Appearance: She is well-developed. She is obese. She is not ill-appearing or toxic-appearing.  HENT:     Head: Normocephalic and atraumatic.     Mouth/Throat:     Mouth: Mucous membranes are moist.      Pharynx: Oropharynx is clear.  Eyes:     Extraocular Movements: Extraocular movements intact.     Conjunctiva/sclera: Conjunctivae normal.     Pupils: Pupils are equal, round, and reactive to light.     Comments: No nystagmus  Cardiovascular:     Rate and Rhythm: Normal rate and regular rhythm.     Pulses: Normal pulses.     Heart sounds: Normal heart sounds. No murmur heard. Pulmonary:     Effort: Pulmonary effort is normal. No respiratory distress.     Breath sounds: Normal breath sounds. No stridor. No wheezing, rhonchi or rales.  Abdominal:     General: Abdomen is flat.     Palpations: Abdomen is soft.     Tenderness: There is abdominal tenderness (diffuse). There is no guarding.  Musculoskeletal:        General: No swelling.     Cervical back: Neck supple.     Right lower leg: No edema.     Left lower leg: No edema.  Skin:    General: Skin is warm and dry.     Capillary Refill: Capillary refill takes less than 2 seconds.  Neurological:     General: No focal deficit present.     Mental Status: She is alert and oriented to person, place, and time.     Motor: Weakness present.     Comments: No slurred speech, facial asymmetry, pronator drift.  There is weakness in all extremities  Psychiatric:        Mood and Affect: Mood normal.     ED Results / Procedures / Treatments   Labs (all labs ordered are listed, but only abnormal results are displayed) Labs Reviewed  CBC WITH DIFFERENTIAL/PLATELET - Abnormal; Notable for the following components:      Result Value   RBC 3.71 (*)    Hemoglobin 11.7 (*)    HCT 35.4 (*)    Lymphs Abs 0.6 (*)    All other components within normal limits  CBG MONITORING, ED - Abnormal; Notable for the following components:   Glucose-Capillary 154 (*)    All other components within normal limits  COMPREHENSIVE METABOLIC PANEL  URINALYSIS, ROUTINE W REFLEX MICROSCOPIC  BRAIN NATRIURETIC PEPTIDE    EKG None  Radiology DG Chest Port 1  View  Result Date: 08/24/2022 CLINICAL DATA:  Shortness of breath EXAM: PORTABLE CHEST 1 VIEW COMPARISON:  06/30/2022 FINDINGS: Transverse diameter of heart is increased. There are no signs of pulmonary edema or new focal infiltrates. There is no pleural effusion or pneumothorax. Degenerative changes are noted in both shoulders, more severe on the right side. IMPRESSION: There are no signs of pulmonary edema or focal pulmonary consolidation. Electronically Signed   By: Elmer Picker M.D.   On: 08/24/2022 14:37  Procedures Procedures    Medications Ordered in ED Medications  morphine (PF) 4 MG/ML injection 4 mg (4 mg Intravenous Given 08/24/22 1456)    ED Course/ Medical Decision Making/ A&P                           Medical Decision Making Amount and/or Complexity of Data Reviewed Labs: ordered. Radiology: ordered. ECG/medicine tests: ordered.  Risk Prescription drug management.  This patient presents to the ED for concern of weakness and fatigue, this involves an extensive number of treatment options, and is a complaint that carries with it a high risk of complications and morbidity.  The differential diagnosis of weakness includes but is not limited to neurologic causes (GBS, myasthenia gravis, CVA, MS, ALS, transverse myelitis, spinal cord injury, CVA, botulism, ) and other causes: ACS, Arrhythmia, syncope, orthostatic hypotension, sepsis, hypoglycemia, electrolyte disturbance, hypothyroidism, respiratory failure, symptomatic anemia, dehydration, heat injury, polypharmacy, malignancy.    Co morbidities that complicate the patient evaluation  CHF, CKD, depression, diabetes, previous stroke   Additional history obtained from: Nursing notes from this visit. Previous records within EMR system telephone encounters on 9/6 and 9/28.  Hospital admission on 810 for CHF, and chronic kidney disease EMS provides a portion of the history  I ordered, reviewed and interpreted labs  which include: CBC, CMP, BNP, CBG.  CBC shows improvement in her hemoglobin to 11.7.  BMP shows improvement in her level to 298.  CMP shows hyperglycemia 157, and an improved creatinine of 2.42 over the previous results.  All other labs within normal limits.   I ordered imaging studies including chest x-ray I independently visualized and interpreted imaging which showed no acute cardiopulmonary abnormalities.  No signs of pulmonary edema. I agree with the radiologist interpretation  Cardiac Monitoring:  The patient was maintained on a cardiac monitor.  I personally viewed and interpreted the cardiac monitored which showed an underlying rhythm of: NSR  Consultations Obtained:  I requested consultation with the hospitalist Dr. Coy Saunas,  and discussed lab and imaging findings as well as pertinent plan - they recommend: Admission for further work-up for generalized fatigue and weakness  Afebrile, hemodynamically stable.  Patient is a 71 year old female with a history as stated above who presents ED for evaluation of 4 days of increasing weakness and fatigue.  She states she has not been able to eat over the past 4 days and when asked why she states "I just cannot."  She says she has been living off of ensures and Peps[employer].  She has been bedbound for the past 2 days and only gets up to transition to the commode that she has next to her bed.  Also reported mild abdominal pain on presentation, however states that this resolved quickly after her presentation here.  Lab work-up revealed an improvement in her baseline creatinine, hemoglobin, and BNP.  Chest x-ray showed no pulmonary edema.  EKG was at baseline.  Attempted to give patient food while in the ED and she refused.  Patient also refused to ambulate.  It is my opinion the patient is not safe to go home in this condition and would benefit from inpatient work-up and possible SNF placement.   Patient's case discussed with Dr. Francia Greaves who agrees with plan  to discharge with follow-up.   Note: Portions of this report may have been transcribed using voice recognition software. Every effort was made to ensure accuracy; however, inadvertent computerized transcription errors may still be  present.          Final Clinical Impression(s) / ED Diagnoses Final diagnoses:  None    Rx / DC Orders ED Discharge Orders     None         Roylene Reason, Alicia [name] 08/24/22 1918    Audley Hose, MD 08/24/22 [phone]

## 2022-08-24 NOTE — Hospital Course (Addendum)
Principal Problem:   Chronic generalized abdominal pain Active Problems:   Acute worsening of stage 4 chronic kidney disease (HCC)   Generalized weakness   Pressure injury of skin   Nausea and vomiting   Diarrhea   Duodenal ulcer   Mucosal abnormality of stomach   Mucosal abnormality of esophagus   Food aversion   Hypoalbuminemia due to protein-calorie malnutrition (HCC)  Resolved Problems:   * No resolved hospital problems. *    Amlodipine, ASA, Bentyl, duloxetine, tube feeds with free water flushes, dilaudid, insulin, creon, metoprolol, mirtazapine, MV, protonix, rosuvastatin, sucralfate  10/4: Not feeling good today. Having stomach pain 4/10. Having liquid stools, watery in nature. Having a lot of gas. Pain stays in stomach and some pain in her chest at times.     SH: Lives by herself. Has aide that comes to her house 4 times a week for a few hours in the afternoon to help her. Neighbor helps her sometimes.   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   Apap HELPS WITH THE ABDOMINAL PAIN. tHE PAIN IS PERSISTENT. dECREASES WITH APAP HOWEVER.   Last vomited 2 d ago. Feels like this was because she had not eaten anything. Looked like some spit up.  10/9 Early satiety, does not feel like food gets stuck in her chest.   +BS   Belly hurts al the time but worse with eating.   10/12 - feels worse today, having to go to bathroom every few minutes. Was unclear on what was found with EGD.    #Diffuse Abdominal Pain Secondary to Unknown Causes  Pt initially presnted to the emergency department because she's been bedbound for a bout month. She had no energy to ambulate, and had not been able to tolerate any PO intake. She endorses constant epigastric and L  sided abdominal pain, that initially would get better with food but then would return soon after. Initial concern was for gastroperesis, and gastric emptying study was ordered. However, patient was unable to tolerate prep for procedure, spitting up the eggs because she did not want to eat them. GI was then brought on board to further evaluate, and EGD/colonoscopy was performed which revealed severe duodenal ulcers and duodenitis. Initially, we thought this was the source of the pain and treated her with IV Protonix and pain medication with Dilaudid. 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   #AKI on CKD Pt's baseline creatinine is usually around 2.2, and she was given multiple fluid bolus's throughout her admission    10/23 - feels so so,  feels nauseous but no vmiting, depressed appetite, denies pain, no discomfort with rectal tube currently, unsure if rebound tenderness is new  Abdominal distension on exam, soft, diffuse abd tenderness to palp, rebound tenderness??  10/31- suppressed appetite, nothing specific stopping her from eating, willing to try two chocolate ensures today. Doesn't want soda, just juice. She is hopeful to avoid PEG tube. Endorses very frequent bowel movements. Doesn't feel like she can take creon. Says she tried to take it but it made her gag. Encouraged to take 2 ensures and 2 meals today.  Amlodipine, cyproheptadine, bentyl, famotidine, lasix, insulin, creon, reglan, mirtazapine, compazine, crestor, sodium bicarb, thiamine, vancomycin

## 2022-08-25 ENCOUNTER — Observation Stay (HOSPITAL_BASED_OUTPATIENT_CLINIC_OR_DEPARTMENT_OTHER): Payer: Medicare HMO

## 2022-08-25 ENCOUNTER — Other Ambulatory Visit: Payer: Self-pay

## 2022-08-25 DIAGNOSIS — K551 Chronic vascular disorders of intestine: Secondary | ICD-10-CM | POA: Diagnosis not present

## 2022-08-25 DIAGNOSIS — R112 Nausea with vomiting, unspecified: Secondary | ICD-10-CM | POA: Diagnosis not present

## 2022-08-25 DIAGNOSIS — R531 Weakness: Secondary | ICD-10-CM

## 2022-08-25 DIAGNOSIS — R109 Unspecified abdominal pain: Secondary | ICD-10-CM | POA: Diagnosis not present

## 2022-08-25 DIAGNOSIS — R197 Diarrhea, unspecified: Secondary | ICD-10-CM | POA: Diagnosis not present

## 2022-08-25 LAB — COMPREHENSIVE METABOLIC PANEL
ALT: 14 U/L (ref 0–44)
AST: 15 U/L (ref 15–41)
Albumin: 2.4 g/dL — ABNORMAL LOW (ref 3.5–5.0)
Alkaline Phosphatase: 96 U/L (ref 38–126)
Anion gap: 9 (ref 5–15)
BUN: 32 mg/dL — ABNORMAL HIGH (ref 8–23)
CO2: 24 mmol/L (ref 22–32)
Calcium: 8 mg/dL — ABNORMAL LOW (ref 8.9–10.3)
Chloride: 103 mmol/L (ref 98–111)
Creatinine, Ser: 2.22 mg/dL — ABNORMAL HIGH (ref 0.44–1.00)
GFR, Estimated: 23 mL/min — ABNORMAL LOW (ref >60)
Glucose, Bld: 110 mg/dL — ABNORMAL HIGH (ref 70–99)
Potassium: 3.3 mmol/L — ABNORMAL LOW (ref 3.5–5.1)
Sodium: 136 mmol/L (ref 135–145)
Total Bilirubin: 0.5 mg/dL (ref 0.3–1.2)
Total Protein: 5.3 g/dL — ABNORMAL LOW (ref 6.5–8.1)

## 2022-08-25 LAB — CBC
HCT: 34.8 % — ABNORMAL LOW (ref 36.0–46.0)
Hemoglobin: 11.8 g/dL — ABNORMAL LOW (ref 12.0–15.0)
MCH: 31.8 pg (ref 26.0–34.0)
MCHC: 33.9 g/dL (ref 30.0–36.0)
MCV: 93.8 fL (ref 80.0–100.0)
Platelets: 268 10*3/uL (ref 150–400)
RBC: 3.71 MIL/uL — ABNORMAL LOW (ref 3.87–5.11)
RDW: 15.1 % (ref 11.5–15.5)
WBC: 6.1 10*3/uL (ref 4.0–10.5)
nRBC: 0 % (ref 0.0–0.2)

## 2022-08-25 LAB — CBG MONITORING, ED
Glucose-Capillary: 114 mg/dL — ABNORMAL HIGH (ref 70–99)
Glucose-Capillary: 105 mg/dL — ABNORMAL HIGH (ref 70–99)
Glucose-Capillary: 114 mg/dL — ABNORMAL HIGH (ref 70–99)

## 2022-08-25 LAB — GLUCOSE, CAPILLARY: Glucose-Capillary: 123 mg/dL — ABNORMAL HIGH (ref 70–99)

## 2022-08-25 MED ORDER — POTASSIUM CHLORIDE 20 MEQ PO PACK
40.0000 meq | PACK | Freq: Once | ORAL | Status: AC
  Administered 2022-08-25: 40 meq via ORAL
  Filled 2022-08-25: qty 2

## 2022-08-25 MED ORDER — ASPIRIN 81 MG PO TBEC
81.0000 mg | DELAYED_RELEASE_TABLET | Freq: Every day | ORAL | Status: DC
  Administered 2022-08-25 – 2022-09-03 (×10): 81 mg via ORAL
  Filled 2022-08-25 (×10): qty 1

## 2022-08-25 MED ORDER — FUROSEMIDE 40 MG PO TABS
40.0000 mg | ORAL_TABLET | Freq: Every day | ORAL | Status: DC
  Administered 2022-08-25 – 2022-08-28 (×4): 40 mg via ORAL
  Filled 2022-08-25: qty 2
  Filled 2022-08-25 (×3): qty 1

## 2022-08-25 MED ORDER — ALUM & MAG HYDROXIDE-SIMETH 200-200-20 MG/5ML PO SUSP
15.0000 mL | Freq: Once | ORAL | Status: AC
  Administered 2022-08-25: 15 mL via ORAL
  Filled 2022-08-25: qty 30

## 2022-08-25 NOTE — Progress Notes (Signed)
Mesenteric study has been completed.   Preliminary results in CV Proc.   Alicia [name] 08/25/2022 3:11 PM

## 2022-08-25 NOTE — ED Notes (Signed)
Patient declined orthostatic vitals reports she is too weak to stand.

## 2022-08-25 NOTE — Care Management Obs Status (Signed)
[place] NOTIFICATION   Patient Details  Name: LEXANDRA RETTKE MRN: [id number] Date of Birth: 1951-02-20   Medicare Observation Status Notification Given:  Ernesta Amble, RN 08/25/2022, 4:55 PM

## 2022-08-25 NOTE — Progress Notes (Signed)
Internal Medicine Attending Note:  S: Doing ok this morning, feels weak with on going abdominal pain. Has episodic N/V and diarrhea after PO intake.   O:  Blood pressure (!) 162/83, pulse 73, temperature 98 F (36.7 C), temperature source Oral, resp. rate 15, height '5\' 8"'$  (1.727 m), weight 110 kg, SpO2 99 %.   Constitutional: NAD, appears comfortable Cardiovascular: RRR, no murmurs, rubs, or gallops.  Pulmonary/Chest: CTAB, no wheezes, rales, or rhonchi.  Abdominal: Soft, mildly tender to palpation, non distended. +BS Extremities: Warm and well perfused. Distal pulses intact. No edema.  Psychiatric: Normal mood and affect  A: [name] is a 70yo person living with a history of HFpEF, HTN, DM, CKD4, PAD, prior CVA, vitamin D deficiency, presenting to the ED via EMS for progressive weakness, post prandial N/V/D, and inability to care for herself at home.   Post prandial abdominal pain with N/V/D Weakness - Check orthostatics - F/u H Pylori stool antigen  - Continue Protonix 40 mg daily  - Fecal elastase - Mesenteric vascular US to r/u chronic mesenteric ischemia (no CTA given CKD) - Consider gastric emptying study pending other work up, although gastroparesis would not explain her diarrhea  - PT/ OT   CKD IV Vitamin D deficiency  AofCKD - renal function at baseline  - F/u vitamin D   Chronic HFpEF HTN - Resumed home lasix  - Monitor I&Os, telemetry  - Continue home Metoprolol and amlodipine   Hx CVA Hx PAD - Continue home crestor  - Resumed home ASA daily   Type II DM: - Holding home [place] (not available on formulary)  - SSI, CBG monitoring   Hypokalemia - Replete  - Goal K> 4 and Mag > 2  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

## 2022-08-25 NOTE — Evaluation (Signed)
Occupational Therapy Evaluation Patient Details Name: Alicia [name] MRN: [id number] DOB: Oct 06, 1951 Today's Date: 08/25/2022   History of Present Illness 71 y.o. female.  With a history of CHF, hypertension, prior CVA, type 2 diabetes, stage IV CKD, vitamin D deficiency, PAD, depression who presents to the ED via EMS for evaluation of failure to thrive.  Patient states she has felt progressively weak.   Clinical Impression   Patient admitted for the diagnosis above.  PTA she lives alone, has an aide M-[id number] to assist with ADL and iADL, and a helpful neighbor that checks on her, and will bring her ensure drinks, but admits to sedentary lifestyle, poor PO intake and generalized weakness.  Currently she is needing up to Mod A for ADL completion and basic mobility in the ED room.  She does use a RW at baseline, and only gets up if she needs to toilet, or to shower when her aide is there.  Patient has completed [place] [facility] in the past, and would prefer that, but may need a short rehab stay prior to returning home, ans she only has PRN assist.  OT will follow in the acute setting.    Supine BP 175/95 Sit BP 157/89 Mild dizziness reported.        Recommendations for follow up therapy are one component of a multi-disciplinary discharge planning process, led by the attending physician.  Recommendations may be updated based on patient status, additional functional criteria and insurance authorization.   Follow Up Recommendations  Skilled nursing-short term rehab (<3 hours/day)    Assistance Recommended at Discharge Frequent or constant Supervision/Assistance  Patient can return home with the following Help with stairs or ramp for entrance;Assist for transportation;Assistance with cooking/housework;A lot of help with bathing/dressing/bathroom;A lot of help with walking and/or transfers    Functional Status Assessment  Patient has had a recent decline in their functional status and demonstrates the  ability to make significant improvements in function in a reasonable and predictable amount of time.  Equipment Recommendations  None recommended by OT    Recommendations for Other Services       Precautions / Restrictions Precautions Precautions: Fall Restrictions Weight Bearing Restrictions: No      Mobility Bed Mobility Overal bed mobility: Needs Assistance Bed Mobility: Supine to Sit, Sit to Supine     Supine to sit: Mod assist Sit to supine: Mod assist   General bed mobility comments: trunk elevation to sit and legs to lie down    Transfers Overall transfer level: Needs assistance   Transfers: Sit to/from Stand Sit to Stand: Min assist                  Balance Overall balance assessment: Needs assistance Sitting-balance support: Feet unsupported, Bilateral upper extremity supported Sitting balance-Leahy Scale: Fair     Standing balance support: Reliant on assistive device for balance Standing balance-Leahy Scale: Poor                             ADL either performed or assessed with clinical judgement   ADL Overall ADL's : Needs assistance/impaired Eating/Feeding: Set up;Bed level   Grooming: Wash/dry hands;Wash/dry face;Sitting;Min guard   Upper Body Bathing: Minimal assistance;Sitting   Lower Body Bathing: Moderate assistance;Sit to/from stand   Upper Body Dressing : Minimal assistance;Sitting   Lower Body Dressing: Moderate assistance;Sit to/from stand   Toilet Transfer: Moderate assistance;BSC/3in1;Stand-pivot  Vision Patient Visual Report: No change from baseline       Perception Perception Perception: Within Functional Limits   Praxis Praxis Praxis: Intact    Pertinent Vitals/Pain Pain Assessment Pain Assessment: No/denies pain     Hand Dominance Right   Extremity/Trunk Assessment Upper Extremity Assessment Upper Extremity Assessment: Generalized weakness;RUE deficits/detail RUE  Deficits / Details: chronic R shoulder dysfunction RUE Sensation: WNL RUE Coordination: WNL   Lower Extremity Assessment Lower Extremity Assessment: Defer to PT evaluation   Cervical / Trunk Assessment Cervical / Trunk Assessment: Kyphotic   Communication Communication Communication: No difficulties   Cognition Arousal/Alertness: Awake/alert Behavior During Therapy: WFL for tasks assessed/performed Overall Cognitive Status: Within Functional Limits for tasks assessed                                       General Comments   VSS on RA    Exercises     Shoulder Instructions      Home Living Family/patient expects to be discharged to:: Private residence Living Arrangements: Alone Available Help at Discharge: Personal care attendant;Available PRN/intermittently;Neighbor Type of Home: Apartment Home Access: Stairs to enter [employer] of Steps: 2 Entrance Stairs-Rails: Right;Left Home Layout: One level     Bathroom Shower/Tub: Teacher, early years/pre: Standard Bathroom Accessibility: No   Home Equipment: Curator (4 wheels);Rolling Walker (2 wheels);Cane - quad;Cane - single point;Tub bench   Additional Comments: 2L O2 PRN at home      Prior Functioning/Environment Prior Level of Function : Needs assist             Mobility Comments: pt reports ambulating with use of RW within the home ADLs Comments: requires assistance PRN with dressing, assistance from aide for household chores and cooking        OT Problem List: Impaired balance (sitting and/or standing);Decreased activity tolerance;Decreased strength      OT Treatment/Interventions: Self-care/ADL training;Therapeutic activities;Therapeutic exercise;DME and/or AE instruction;Patient/family education;Balance training    OT Goals(Current goals can be found in the care plan section) Acute Rehab OT Goals Patient Stated Goal: Either rehab or back  home OT Goal Formulation: With patient Time For Goal Achievement: 09/08/22 Potential to Achieve Goals: Good ADL Goals Pt Will Perform Grooming: with supervision;standing Pt Will Perform Lower Body Dressing: with supervision;sit to/from stand Pt Will Transfer to Toilet: with supervision;stand pivot transfer;bedside commode Pt/caregiver will Perform Home Exercise Program: Increased strength;Both right and left upper extremity;With theraband;With Supervision  OT Frequency: Min 2X/week    Co-evaluation              AM-PAC OT "6 Clicks" Daily Activity     Outcome Measure Help from another person eating meals?: None Help from another person taking care of personal grooming?: None Help from another person toileting, which includes using toliet, bedpan, or urinal?: A Lot Help from another person bathing (including washing, rinsing, drying)?: A Lot Help from another person to put on and taking off regular upper body clothing?: A Little Help from another person to put on and taking off regular lower body clothing?: A Lot 6 Click Score: 17   End of Session Equipment Utilized During Treatment: Rolling walker (2 wheels) Nurse Communication: Mobility status  Activity Tolerance: Patient tolerated treatment well Patient left: in bed;with call bell/phone within reach  OT Visit Diagnosis: Unsteadiness on feet (R26.81);Muscle weakness (generalized) (M62.81);History of falling (Z91.81)  Time: 9447-3958 OT Time Calculation (min): 16 min Charges:  OT General Charges $OT Visit: 1 Visit OT Evaluation $OT Eval Moderate Complexity: 1 Mod  08/25/2022  RP, OTR/L  Acute Rehabilitation Services  Office:  [phone]   Metta Clines 08/25/2022, 10:19 AM

## 2022-08-25 NOTE — ED Notes (Addendum)
Pt's breif checked and to remains clean

## 2022-08-25 NOTE — ED Notes (Signed)
PATIENT IS ON PUR-WICK AND MONITOR

## 2022-08-25 NOTE — ED Notes (Signed)
Pt offered to use the bedpan and brief changed

## 2022-08-25 NOTE — Evaluation (Signed)
Physical Therapy Evaluation Patient Details Name: Alicia [name] MRN: [id number] DOB: 24-Dec-1950 Today's Date: 08/25/2022  History of Present Illness  71 y.o. female presented to ED 10/4 with FTT. PMhx: CHf, HTN, CVA, T2DM, CKD, vit D deficiency, PAD, depression, HFpEF  Clinical Impression  Pt received on stretcher stating she keeps getting sick and weak. Pt states she did go to SNF for 2 months but got sick again after returning home for 1 week and has had progressive weakness. Pt with decreased cognition, processing and awareness as well as decreased strength, balance and function who could not even walk in room this session and does not have assist daily at home. Pt will benefit from acute therapy to maximize mobility, safety and independence to decrease burden of care.   HR 68-73       Recommendations for follow up therapy are one component of a multi-disciplinary discharge planning process, led by the attending physician.  Recommendations may be updated based on patient status, additional functional criteria and insurance authorization.  Follow Up Recommendations Skilled nursing-short term rehab (<3 hours/day) Can patient physically be transported by private vehicle: No    Assistance Recommended at Discharge Frequent or constant Supervision/Assistance  Patient can return home with the following  A little help with walking and/or transfers;Assistance with cooking/housework;Direct supervision/assist for medications management;Assist for transportation;A lot of help with bathing/dressing/bathroom    Equipment Recommendations None recommended by PT  Recommendations for Other Services       Functional Status Assessment Patient has had a recent decline in their functional status and demonstrates the ability to make significant improvements in function in a reasonable and predictable amount of time.     Precautions / Restrictions Precautions Precautions: Fall Restrictions Weight  Bearing Restrictions: No      Mobility  Bed Mobility Overal bed mobility: Needs Assistance Bed Mobility: Sit to Supine, Rolling, Sidelying to Sit Rolling: Min assist Sidelying to sit: Min assist   Sit to supine: Total assist   General bed mobility comments: pt struggling to initiate transition from supine to sit with cues to roll to right with assist to elevate trunk from surface and increased time. Return to bed via maximove    Transfers Overall transfer level: Needs assistance   Transfers: Sit to/from Stand Sit to Stand: Min assist           General transfer comment: min assist with left knee blocked to rise from stretcher, cues for hand placement.    Ambulation/Gait Ambulation/Gait assistance: Min assist Gait Distance (Feet): 6 Feet Assistive device: Rolling walker (2 wheels) Gait Pattern/deviations: Step-through pattern, Decreased stride length, Trunk flexed   Gait velocity interpretation: <1.31 ft/sec, indicative of household ambulator   General Gait Details: cues for posture and proximity to RW. PT walked to end of bed and stated need to turn around. Pt given cues to turn to return to bed but pt stated weakness and unable to move further. Pt ultimately sat on therapist thigh and attempted x 2 with RN and NT to have pt stand as no chair nearby but pt unable and lowered to floor. Maximove for transition from floor to bed  Stairs            Wheelchair Mobility    Modified Rankin (Stroke Patients Only)       Balance Overall balance assessment: Needs assistance Sitting-balance support: No upper extremity supported, Feet supported Sitting balance-Leahy Scale: Fair Sitting balance - Comments: EOB and at floor   Standing balance support:  Reliant on assistive device for balance Standing balance-Leahy Scale: Poor Standing balance comment: bil UE on RW                             Pertinent Vitals/Pain Pain Assessment Pain Assessment: No/denies  pain    Home Living Family/patient expects to be discharged to:: Private residence Living Arrangements: Alone Available Help at Discharge: Personal care attendant;Available PRN/intermittently;Neighbor Type of Home: Apartment Home Access: Stairs to enter Entrance Stairs-Rails: Psychiatric nurse of Steps: 2   Home Layout: One level Home Equipment: Curator (4 wheels);Rolling Walker (2 wheels);Cane - quad;Cane - single point;Tub bench Additional Comments: 2L O2 PRN at home    Prior Function Prior Level of Function : Needs assist             Mobility Comments: uses RW for limited gait in home, has not been able to walk in community ADLs Comments: requires assistance PRN with dressing, assistance from aide for household chores and cooking     Hand Dominance   Dominant Hand: Right    Extremity/Trunk Assessment   Upper Extremity Assessment Upper Extremity Assessment: Generalized weakness RUE Deficits / Details: chronic R shoulder dysfunction RUE Sensation: WNL RUE Coordination: WNL    Lower Extremity Assessment Lower Extremity Assessment: Generalized weakness    Cervical / Trunk Assessment Cervical / Trunk Assessment: Kyphotic  Communication   Communication: No difficulties  Cognition Arousal/Alertness: Awake/alert Behavior During Therapy: WFL for tasks assessed/performed Overall Cognitive Status: Impaired/Different from baseline Area of Impairment: Following commands, Safety/judgement, Problem solving                       Following Commands: Follows one step commands inconsistently, Follows one step commands with increased time Safety/Judgement: Decreased awareness of safety, Decreased awareness of deficits   Problem Solving: Slow processing, Requires verbal cues General Comments: pt unable to even walk in room and keeps stating she can go home by herself. does not consistently follow cues        General Comments       Exercises     Assessment/Plan    PT Assessment Patient needs continued PT services  PT Problem List Decreased strength;Decreased mobility;Decreased safety awareness;Decreased activity tolerance;Decreased balance;Decreased knowledge of use of DME;Decreased cognition       PT Treatment Interventions Gait training;Functional mobility training;Balance training;Stair training;Therapeutic exercise;Patient/family education;Therapeutic activities;DME instruction    PT Goals (Current goals can be found in the Care Plan section)  Acute Rehab PT Goals Patient Stated Goal: return to walking and feeling ok PT Goal Formulation: With patient Time For Goal Achievement: 09/08/22 Potential to Achieve Goals: Fair    Frequency Min 3X/week     Co-evaluation               AM-PAC PT "6 Clicks" Mobility  Outcome Measure Help needed turning from your back to your side while in a flat bed without using bedrails?: A Little Help needed moving from lying on your back to sitting on the side of a flat bed without using bedrails?: A Little Help needed moving to and from a bed to a chair (including a wheelchair)?: A Little Help needed standing up from a chair using your arms (e.g., wheelchair or bedside chair)?: A Little Help needed to walk in hospital room?: A Lot Help needed climbing 3-5 steps with a railing? : Total 6 Click Score: 15    End of Session Equipment  Utilized During Treatment: Gait belt Activity Tolerance: Patient tolerated treatment well Patient left: in bed;with call bell/phone within reach;with nursing/sitter in room Nurse Communication: Mobility status PT Visit Diagnosis: Other abnormalities of gait and mobility (R26.89);History of falling (Z91.81);Muscle weakness (generalized) (M62.81)    Time: 0600-4599 PT Time Calculation (min) (ACUTE ONLY): 39 min   Charges:   PT Evaluation $PT Eval Moderate Complexity: 1 Mod          [place], PT Acute Rehabilitation  Services Office: [phone]   Sandy Salaam Doreen Garretson 08/25/2022, 1:38 PM

## 2022-08-26 LAB — CBC
HCT: 36.1 % (ref 36.0–46.0)
Hemoglobin: 12.4 g/dL (ref 12.0–15.0)
MCH: 32.5 pg (ref 26.0–34.0)
MCHC: 34.3 g/dL (ref 30.0–36.0)
MCV: 94.5 fL (ref 80.0–100.0)
Platelets: 300 10*3/uL (ref 150–400)
RBC: 3.82 MIL/uL — ABNORMAL LOW (ref 3.87–5.11)
RDW: 15.8 % — ABNORMAL HIGH (ref 11.5–15.5)
WBC: 6.8 10*3/uL (ref 4.0–10.5)
nRBC: 0 % (ref 0.0–0.2)

## 2022-08-26 LAB — BASIC METABOLIC PANEL
Anion gap: 11 (ref 5–15)
BUN: 34 mg/dL — ABNORMAL HIGH (ref 8–23)
CO2: 21 mmol/L — ABNORMAL LOW (ref 22–32)
Calcium: 7.9 mg/dL — ABNORMAL LOW (ref 8.9–10.3)
Chloride: 103 mmol/L (ref 98–111)
Creatinine, Ser: 2.34 mg/dL — ABNORMAL HIGH (ref 0.44–1.00)
GFR, Estimated: 22 mL/min — ABNORMAL LOW (ref >60)
Glucose, Bld: 100 mg/dL — ABNORMAL HIGH (ref 70–99)
Potassium: 4 mmol/L (ref 3.5–5.1)
Sodium: 135 mmol/L (ref 135–145)

## 2022-08-26 LAB — GLUCOSE, CAPILLARY
Glucose-Capillary: 104 mg/dL — ABNORMAL HIGH (ref 70–99)
Glucose-Capillary: 99 mg/dL (ref 70–99)
Glucose-Capillary: 85 mg/dL (ref 70–99)
Glucose-Capillary: 84 mg/dL (ref 70–99)

## 2022-08-26 MED ORDER — RENA-VITE PO TABS
1.0000 | ORAL_TABLET | Freq: Every day | ORAL | Status: DC
  Administered 2022-08-26 – 2022-09-06 (×12): 1 via ORAL
  Filled 2022-08-26 (×12): qty 1

## 2022-08-26 MED ORDER — ENOXAPARIN SODIUM 40 MG/0.4ML IJ SOSY
40.0000 mg | PREFILLED_SYRINGE | INTRAMUSCULAR | Status: DC
  Administered 2022-08-26 – 2022-09-13 (×19): 40 mg via SUBCUTANEOUS
  Filled 2022-08-26 (×19): qty 0.4

## 2022-08-26 NOTE — Progress Notes (Signed)
NEW ADMISSION NOTE New Admission Note:   Arrival Method: Stretcher from ED Mental Orientation: Alert and oriented x4 Telemetry: Tele Box #[id number]  Assessment: Initiated  Skin: Intact IV: Right A.C IV, saline locked  Pain: 7/10 abdominal pain Tubes: None Safety Measures: Safety Fall Prevention Plan has been given, discussed and implemented. Admission: Initiated  [facility] [place] Orientation: Patient has been orientated to the room, unit and staff.  Family: Not present at the bedside  Orders have been reviewed and implemented. Will continue to monitor the patient. Call light has been placed within reach and bed alarm has been activated.   Sharmon Revere, RN

## 2022-08-26 NOTE — TOC Initial Note (Addendum)
Transition of Care [facility]) - Initial/Assessment Note    Patient Details  Name: Alicia [name] MRN: [id number] Date of Birth: Mar 23, 1951  [facility] CM/SW Contact:    Coralee Pesa, [place] Phone Number: 08/26/2022, 12:54 PM  Clinical Narrative:                 CSW spoke with pt and advised her of recommendation for SNF at discharge. Pt states she doesn't want to but she guesses she has to. Pt confirms she lives alone, pt does not have supports, as daughter lives in [place]. Pt has been to SNF at [facility] before, she does not want to return. [place] states pt used 50 of her SNF days with them. No preference noted, other than to be in the [place] area. CSW to fax out referrals and follow up with any offers. Authorization started as pending contracted, update Navi when a choice is made.TOC will continue to follow.  Expected Discharge Plan: Skilled Nursing Facility Barriers to Discharge: Insurance Authorization, SNF Pending bed offer, Continued Medical Work up   Patient Goals and CMS Choice Patient states their goals for this hospitalization and ongoing recovery are:: Pt wants to be able to return home. CMS Medicare.gov Compare Post Acute Care list provided to:: Patient Choice offered to / list presented to : Patient  Expected Discharge Plan and Services Expected Discharge Plan: [place] Acute Care Choice: [place] arrangements for the past 2 months: Single Family Home                                      Prior Living Arrangements/Services Living arrangements for the past 2 months: Single Family Home Lives with:: Self Patient language and need for interpreter reviewed:: Yes Do you feel safe going back to the place where you live?: Yes      Need for Family Participation in Patient Care: Yes (Comment) Care giver support system in place?: No (comment)   Criminal Activity/Legal Involvement Pertinent to  Current Situation/Hospitalization: No - Comment as needed  Activities of Daily Living Home Assistive Devices/Equipment: [place] Rile (specify type) ADL Screening (condition at time of admission) Patient's cognitive ability adequate to safely complete daily activities?: Yes Is the patient deaf or have difficulty hearing?: No Does the patient have difficulty seeing, even when wearing glasses/contacts?: No Does the patient have difficulty concentrating, remembering, or making decisions?: No Patient able to express need for assistance with ADLs?: Yes Does the patient have difficulty dressing or bathing?: Yes Independently performs ADLs?: No Communication: Independent Dressing (OT): Needs assistance Is this a change from baseline?: Pre-admission baseline Grooming: Needs assistance Is this a change from baseline?: Pre-admission baseline Feeding: Independent Bathing: Needs assistance Is this a change from baseline?: Pre-admission baseline Toileting: Independent with device (comment) In/Out Bed: Independent with device (comment) Walks in Home: Independent with device (comment) Does the patient have difficulty walking or climbing stairs?: Yes Weakness of Legs: Both Weakness of Arms/Hands: None  Permission Sought/Granted Permission sought to share information with : Family Supports Permission granted to share information with : No              Emotional Assessment Appearance:: Appears stated age Attitude/Demeanor/Rapport: Guarded Affect (typically observed): Flat Orientation: : Oriented to Self, Oriented to Place, Oriented to  Time, Oriented to Situation Alcohol / Substance Use: Not Applicable Psych Involvement: No (comment)  Admission diagnosis:  Generalized weakness [R53.1] Patient Active Problem List   Diagnosis Date Noted   Generalized weakness 08/24/2022   Acute exacerbation of congestive heart failure ([place]) 06/30/2022   Hypernatremia 06/30/2022   Hyperkalemia 06/17/2022    Normocytic anemia 06/17/2022   Intertrigo    Metabolic acidosis    Cellulitis 05/20/2022   Vitamin D deficiency 05/20/2022   Headache 02/16/2022   Disequilibrium 12/01/2021   (HFpEF) heart failure with preserved ejection fraction ([place]) 07/18/2021   Exacerbation of RAD (reactive airway disease) 02/27/2020   Stage 4 chronic kidney disease ([place]) 02/27/2020   Tobacco use 02/27/2020   Prolonged Q-T interval on ECG 04/27/2018   Diabetes mellitus type 2 in obese ([place]) 03/16/2018   Severe obesity (BMI >= 40) (HCC)    CVA (cerebral vascular accident) ([place]) 03/15/2018   Peripheral vascular disease, unspecified ([place]) 11/30/2012   Hyperlipidemia 07/20/2007   Primary hypertension 07/20/2007   PCP:  Rick Duff, MD Pharmacy:   [facility] ([facility] [place] [address] [place] ([place])  [address] Phone: [phone] Fax: [phone]     Social Determinants of Health (SDOH) Interventions    Readmission Risk Interventions    03/02/2020    1:08 PM  Readmission Risk Prevention Plan  Transportation Screening Complete  PCP or Specialist Appt within 5-7 Days Complete  Home Care Screening Complete  Medication Review (RN CM) Complete

## 2022-08-26 NOTE — Progress Notes (Signed)
Subjective:   Summary: Alicia [name] is a 71 y.o. year old female currently admitted on the IMTS HD#0 for postprandial abdominal pain with nausea and vomiting.  Overnight Events: No overnight events  Patient was still in pain this morning, she was tearful on exam as well.  She is feeling nauseous, but has denied any vomiting.  She has not been able to eat since being admitted to the hospital, due to lack of appetite. Objective:  Vital signs in last 24 hours: Vitals:   08/25/22 1715 08/25/22 1949 08/26/22 0012 08/26/22 0452  BP: (!) 158/80 (!) 174/80 (!) 154/78 (!) 158/75  Pulse: (!) 58 65 72 69  Resp: '14 15 18 18  '$ Temp:  97.6 F (36.4 C) 98.4 F (36.9 C) 98.2 F (36.8 C)  TempSrc:  Oral    SpO2: 99% 100% 100% 100%  Weight:      Height:       Supplemental O2: Room Air SpO2: 100 %   Physical Exam:  Constitutional: Tearful woman, complaining of abdominal pain, no acute distress Cardiovascular: RRR, no murmurs, rubs or gallops Pulmonary/Chest: normal work of breathing on room air, lungs clear to auscultation bilaterally Abdominal: Tender to palpation in the epigastric area, bowel sounds heard. Skin: warm and dry Extremities: upper/lower extremity pulses 2+, no lower extremity edema present  [facility] Weights   08/24/22 1519  Weight: 110 kg     Intake/Output Summary (Last 24 hours) at 08/26/2022 0808 Last data filed at 08/26/2022 0452 Gross per 24 hour  Intake 1537 ml  Output 150 ml  Net 1387 ml   Net IO Since Admission: 2,387 mL [08/26/22 0808]  Pertinent Labs:    Latest Ref Rng & Units 08/25/2022    3:37 AM 08/24/2022    2:25 PM 07/01/2022    3:49 AM  CBC  WBC 4.0 - 10.5 K/uL 6.1  5.7  6.3   Hemoglobin 12.0 - 15.0 g/dL 11.8  11.7  8.1   Hematocrit 36.0 - 46.0 % 34.8  35.4  25.3   Platelets 150 - 400 K/uL 268  262  401        Latest Ref Rng & Units 08/25/2022    3:37 AM 08/24/2022    2:25 PM 07/01/2022    3:49 AM  CMP  Glucose 70 - 99  mg/dL 110  157  72   BUN 8 - 23 mg/dL 32  31  30   Creatinine 0.44 - 1.00 mg/dL 2.22  2.42  2.95   Sodium 135 - 145 mmol/L 136  136  138   Potassium 3.5 - 5.1 mmol/L 3.3  3.4  3.2   Chloride 98 - 111 mmol/L 103  105  108   CO2 22 - 32 mmol/L '24  20  21   '$ Calcium 8.9 - 10.3 mg/dL 8.0  7.9  7.6   Total Protein 6.5 - 8.1 g/dL 5.3  5.3    Total Bilirubin 0.3 - 1.2 mg/dL 0.5  0.6    Alkaline Phos 38 - 126 U/L 96  102    AST 15 - 41 U/L 15  19    ALT 0 - 44 U/L 14  15      Imaging: VAS [place] MESENTERIC  Result Date: 08/25/2022 ABDOMINAL VISCERAL [name]  Date of Exam:   08/25/2022 Medical Rec #: [id number]  Accession #:    [id number] Date of Birth: 12-24-1950             Patient Gender: F Patient Age:   45 years Exam Location:  [facility] Procedure:      VAS [place] MESENTERIC Referring Phys: [id number] Velna Ochs -------------------------------------------------------------------------------- Indications: Insufficiency mesenteric High Risk Factors: Hypertension, hyperlipidemia, Diabetes. Limitations: Air/bowel gas, obesity and patient discomfort. Comparison Study: no prior Performing Technologist: Archie Patten RVS Supporting Technologist: Rogelia Rohrer RVT, RDMS  Examination Guidelines: A complete evaluation includes B-mode imaging, spectral Doppler, color Doppler, and power Doppler as needed of all accessible portions of each vessel. Bilateral testing is considered an integral part of a complete examination. Limited examinations for reoccurring indications may be performed as noted.  Duplex Findings: +--------------------+--------+--------+------+--------+ Mesenteric          PSV cm/sEDV cm/sPlaqueComments +--------------------+--------+--------+------+--------+ Celiac Artery Origin  126                          +--------------------+--------+--------+------+--------+ SMA Origin            166      16                   +--------------------+--------+--------+------+--------+ SMA Proximal          219      19                  +--------------------+--------+--------+------+--------+ SMA Mid               162      17                  +--------------------+--------+--------+------+--------+ SMA Distal            120      12                  +--------------------+--------+--------+------+--------+ CHA                   194      24                  +--------------------+--------+--------+------+--------+ Splenic                90      20                  +--------------------+--------+--------+------+--------+ IMA                    87                          +--------------------+--------+--------+------+--------+    Summary: Mesenteric: Normal Celiac artery , Superior Mesenteric artery, Inferior Mesenteric artery, Splenic artery and Hepatic artery findings.  *See table(s) above for measurements and observations.  Diagnosing physician: Jamelle Haring  Electronically signed by Jamelle Haring on 08/25/2022 at 4:46:44 PM.    Final     Assessment/Plan:   Principal Problem:   Generalized weakness   Patient Summary: Alicia [name] is a 71 y.o. with a pertinent PMH of HFpEF, hypertension, diabetes mellitus, CKD 4, PAD, prior CVA, vitamin D deficiency, who presented with progressive and admitted for decreased functionality and postprandial nausea, vomiting, diarrhea.    #Post Prandial Abdominal Pain with N/V/D Weakness Orthostatic blood pressure has not been collected at at this moment.  Still pending results for TSH, B12, phosphorus, magnesium, lipase, fecal  elastase.  This morning, patient was complaining of the same abdominal pain, and her abdomen was tender to palpation in the epigastric region.  She denies any vomiting, but she states that she feels nauseous.  She had a piece of chicken yesterday, but has stayed on liquids and it has been tolerable.  Still unsure of the cause of this  abdominal pain, differentials include peptic ulcer disease, gastroparesis.  Mesenteric ultrasound.  No acute abnormalities.  Patient is not able to to tolerate CTA due to GFR being too low (21) and contrast would likely cause a severe kidney injury.  Plan: - Gastric emptying study has been ordered to assess for gastroparesis in the setting of a patient with type 2 diabetes - Continue to wait for lab results - GI panel has been ordered  #CKDIV Vitamin D deficiency  Anemia of CKD  Renal function continues to be baseline, vitamin D level still pending  #Chronic HFpEF HTN Continuing her Lasix, amlodipine, and metoprolol at this time, blood pressure has been relatively stable.   #Type II DM  Sliding scale insulin, continuous blood glucose monitoring    Chronic Pain  OA bilateral shoulders and knees  - Tylenol '650mg'$  q6HR for mild pain  - Oxycodone '5mg'$  Q6HR for moderate to severe pain     Code: Full    Dispo: Anticipated discharge in less than 3 midnights. Drucie Opitz, MD PGY-1 Internal Medicine Resident Pager Number [phone] Please contact the on call pager after 5 pm and on weekends at [phone].

## 2022-08-26 NOTE — NC FL2 (Signed)
[place] LEVEL OF CARE SCREENING TOOL     IDENTIFICATION  Patient Name: Alicia [name] Birthdate: 05/16/1951 Sex: female Admission Date (Current Location): 08/24/2022  [facility] and [place] Number:  Herbalist and Address:  The [place], [place] [address], [place], [place] [address]      Provider Number: [id number]  Attending Physician Name and Address:  Velna Ochs, MD  Relative Name and Phone Number:  Lakita (Daughter)    [phone]    Current Level of Care: Hospital Recommended Level of Care: [place] Prior Approval Number:    Date Approved/Denied:   PASRR Number: [id number]  Discharge Plan: SNF    Current Diagnoses: Patient Active Problem List   Diagnosis Date Noted   Generalized weakness 08/24/2022   Acute exacerbation of congestive heart failure ([place]) 06/30/2022   Hypernatremia 06/30/2022   Hyperkalemia 06/17/2022   Normocytic anemia 06/17/2022   Intertrigo    Metabolic acidosis    Cellulitis 05/20/2022   Vitamin D deficiency 05/20/2022   Headache 02/16/2022   Disequilibrium 12/01/2021   (HFpEF) heart failure with preserved ejection fraction ([place]) 07/18/2021   Exacerbation of RAD (reactive airway disease) 02/27/2020   Stage 4 chronic kidney disease ([place]) 02/27/2020   Tobacco use 02/27/2020   Prolonged Q-T interval on ECG 04/27/2018   Diabetes mellitus type 2 in obese ([place]) 03/16/2018   Severe obesity (BMI >= 40) (HCC)    CVA (cerebral vascular accident) ([place]) 03/15/2018   Peripheral vascular disease, unspecified ([place]) 11/30/2012   Hyperlipidemia 07/20/2007   Primary hypertension 07/20/2007    Orientation RESPIRATION BLADDER Height & Weight     Self, Time, Situation, Place  Normal Continent, External catheter Weight: 242 lb 8.1 oz (110 kg) Height:  '5\' 8"'$  (172.7 cm)  BEHAVIORAL SYMPTOMS/MOOD NEUROLOGICAL BOWEL NUTRITION STATUS      Continent Diet (See DC summary)   AMBULATORY STATUS COMMUNICATION OF NEEDS Skin   Extensive Assist Verbally PU Stage and Appropriate Care (Sacrum pressure injury)                       Personal Care Assistance Level of Assistance  Bathing, Feeding, Dressing Bathing Assistance: Maximum assistance Feeding assistance: Independent Dressing Assistance: Maximum assistance     Functional Limitations Info  Sight, Hearing, Speech Sight Info: Adequate Hearing Info: Adequate Speech Info: Adequate    SPECIAL CARE FACTORS FREQUENCY  PT (By licensed PT), OT (By licensed OT)     PT Frequency: 5x week OT Frequency: 5x week            Contractures Contractures Info: Not present    Additional Factors Info  Code Status, Allergies, Insulin Sliding Scale Code Status Info: Full Allergies Info: Nifedipine Er   Latex   Lipitor (Atorvastatin   Insulin Sliding Scale Info: See DC summary       Current Medications (08/26/2022):  This is the current hospital active medication list Current Facility-Administered Medications  Medication Dose Route Frequency Provider Last Rate Last Admin   acetaminophen (TYLENOL) tablet 650 mg  650 mg Oral Q6H PRN Lacinda Axon, MD   650 mg at 08/26/22 0809   Or   acetaminophen (TYLENOL) suppository 650 mg  650 mg Rectal Q6H PRN Lacinda Axon, MD       amLODipine (NORVASC) tablet 10 mg  10 mg Oral Daily Lacinda Axon, MD   10 mg at 08/26/22 0809   aspirin EC tablet 81 mg  81 mg Oral Daily Velna Ochs, MD   81 mg at 08/26/22 0809   diclofenac Sodium (VOLTAREN) 1 % topical gel 4 g  4 g Topical QID Romana Juniper, MD   4 g at 08/26/22 0813   enoxaparin (LOVENOX) injection 40 mg  40 mg Subcutaneous Q24H Velna Ochs, MD       feeding supplement (ENSURE ENLIVE / ENSURE PLUS) liquid 237 mL  237 mL Oral BID BM Lacinda Axon, MD   237 mL at 08/25/22 1052   furosemide (LASIX) tablet 40 mg  40 mg Oral Daily Velna Ochs, MD   40 mg at 08/26/22 0809    insulin aspart (novoLOG) injection 0-15 Units  0-15 Units Subcutaneous TID WC Romana Juniper, MD       metoprolol succinate (TOPROL-XL) 24 hr tablet 100 mg  100 mg Oral Daily Lacinda Axon, MD   100 mg at 08/26/22 0809   multivitamin (RENA-VIT) tablet 1 tablet  1 tablet Oral QHS Velna Ochs, MD       oxyCODONE (Oxy IR/ROXICODONE) immediate release tablet 5 mg  5 mg Oral Q6H PRN Lacinda Axon, MD   5 mg at 08/26/22 1001   pantoprazole (PROTONIX) EC tablet 40 mg  40 mg Oral Daily Lacinda Axon, MD   40 mg at 08/26/22 0809   rosuvastatin (CRESTOR) tablet 10 mg  10 mg Oral QPM Lacinda Axon, MD   10 mg at 08/25/22 1733   senna-docusate (Senokot-S) tablet 1 tablet  1 tablet Oral QHS PRN Lacinda Axon, MD         Discharge Medications: Please see discharge summary for a list of discharge medications.  Relevant Imaging Results:  Relevant Lab Results:   Additional Information SS# [place], [place]

## 2022-08-26 NOTE — Progress Notes (Signed)
Initial Nutrition Assessment  DOCUMENTATION CODES:   Obesity unspecified  INTERVENTION:  - Continue Ensure Enlive po BID, each supplement provides 350 kcal and 20 grams of protein.  - Add Rena-vite q day.   NUTRITION DIAGNOSIS:   Inadequate oral intake related to inability to eat, nausea as evidenced by meal completion < 50%.  GOAL:   Patient will meet greater than or equal to 90% of their needs  MONITOR:   PO intake, Supplement acceptance  REASON FOR ASSESSMENT:   Consult Assessment of nutrition requirement/status  ASSESSMENT:   71 y.o. female admits related to weakness. PMH includes: HTN, DM, CKD4, PAD. Pt is currently receiving medical management for weakness and abdominal pain.  Meds reviewed: Lasix, sliding scale insulin. Labs reviewed.    The pt reports that she has not eaten much of anything since admission. Per record, pt ate 25% of her breakfast this am. The pt reports that she has not been able to tolerate much for the past 4 days. She states that prior to that she was eating normally. Pt reports that she has lost some wt. Per record, pt has experienced a 9% wt loss over the past year which is not significant. Pt is currently ordered to receive Ensure BID. Pt states that she likes the Ensure shakes and will drink them.  NUTRITION - FOCUSED PHYSICAL EXAM:  Flowsheet Row Most Recent Value  Orbital Region No depletion  Upper Arm Region No depletion  Thoracic and Lumbar Region No depletion  Buccal Region No depletion  Temple Region No depletion  Clavicle Bone Region No depletion  Clavicle and Acromion Bone Region No depletion  Scapular Bone Region No depletion  Dorsal Hand No depletion  Patellar Region No depletion  Anterior Thigh Region No depletion  Posterior Calf Region No depletion  Edema (RD Assessment) None  Hair Reviewed  Eyes Reviewed  Mouth Reviewed  Skin Reviewed  Nails Reviewed       Diet Order:   Diet Order             Diet regular  Room service appropriate? Yes; Fluid consistency: Thin  Diet effective now                   EDUCATION NEEDS:   Not appropriate for education at this time  Skin:  Skin Assessment: Skin Integrity Issues: Skin Integrity Issues:: Stage I Stage I: Medial sacrum  Last BM:  08/22/22 per pt  Height:   Ht Readings from Last 1 Encounters:  08/24/22 '5\' 8"'$  (1.727 m)    Weight:   Wt Readings from Last 1 Encounters:  08/24/22 110 kg    Ideal Body Weight:  63.6 kg  BMI:  Body mass index is 36.87 kg/m.  Estimated Nutritional Needs:   Kcal:  1590-1905 kcals  Protein:  80-95 gm  Fluid:  1590-1905 mL  Thalia Bloodgood, RD, LDN, CNSC

## 2022-08-27 DIAGNOSIS — R112 Nausea with vomiting, unspecified: Secondary | ICD-10-CM | POA: Diagnosis not present

## 2022-08-27 DIAGNOSIS — L8990 Pressure ulcer of unspecified site, unspecified stage: Secondary | ICD-10-CM | POA: Insufficient documentation

## 2022-08-27 DIAGNOSIS — R197 Diarrhea, unspecified: Secondary | ICD-10-CM | POA: Diagnosis not present

## 2022-08-27 DIAGNOSIS — R531 Weakness: Secondary | ICD-10-CM | POA: Diagnosis not present

## 2022-08-27 LAB — CBC
HCT: 33.8 % — ABNORMAL LOW (ref 36.0–46.0)
Hemoglobin: 11.7 g/dL — ABNORMAL LOW (ref 12.0–15.0)
MCH: 32.1 pg (ref 26.0–34.0)
MCHC: 34.6 g/dL (ref 30.0–36.0)
MCV: 92.6 fL (ref 80.0–100.0)
Platelets: 229 10*3/uL (ref 150–400)
RBC: 3.65 MIL/uL — ABNORMAL LOW (ref 3.87–5.11)
RDW: 15.5 % (ref 11.5–15.5)
WBC: 7.4 10*3/uL (ref 4.0–10.5)
nRBC: 0 % (ref 0.0–0.2)

## 2022-08-27 LAB — GLUCOSE, CAPILLARY
Glucose-Capillary: 71 mg/dL (ref 70–99)
Glucose-Capillary: 78 mg/dL (ref 70–99)
Glucose-Capillary: 78 mg/dL (ref 70–99)
Glucose-Capillary: 92 mg/dL (ref 70–99)

## 2022-08-27 LAB — BASIC METABOLIC PANEL
Anion gap: 8 (ref 5–15)
BUN: 34 mg/dL — ABNORMAL HIGH (ref 8–23)
CO2: 21 mmol/L — ABNORMAL LOW (ref 22–32)
Calcium: 8 mg/dL — ABNORMAL LOW (ref 8.9–10.3)
Chloride: 105 mmol/L (ref 98–111)
Creatinine, Ser: 2.41 mg/dL — ABNORMAL HIGH (ref 0.44–1.00)
GFR, Estimated: 21 mL/min — ABNORMAL LOW (ref >60)
Glucose, Bld: 79 mg/dL (ref 70–99)
Potassium: 4.3 mmol/L (ref 3.5–5.1)
Sodium: 134 mmol/L — ABNORMAL LOW (ref 135–145)

## 2022-08-27 MED ORDER — ACETAMINOPHEN 325 MG PO TABS
650.0000 mg | ORAL_TABLET | Freq: Four times a day (QID) | ORAL | Status: DC
  Administered 2022-08-27 – 2022-08-31 (×16): 650 mg via ORAL
  Filled 2022-08-27 (×16): qty 2

## 2022-08-27 MED ORDER — ACETAMINOPHEN 650 MG RE SUPP: 650.0000 mg | Freq: Four times a day (QID) | RECTAL | Status: DC

## 2022-08-27 NOTE — Plan of Care (Signed)
  Problem: Fluid Volume: Goal: Ability to maintain a balanced intake and output will improve Outcome: Not Progressing   Problem: Nutritional: Goal: Maintenance of adequate nutrition will improve Outcome: Not Progressing

## 2022-08-27 NOTE — Progress Notes (Signed)
Subjective:   Summary: Alicia [name] is a 71 y.o. year old female currently admitted on the IMTS HD#0 for postprandial abdominal pain with nausea and vomiting.  Overnight Events: No overnight events  Patient denies any nausea but continues to have a poor appetite. She also states that she continues to have diffuse abdominal pain. She has not had any nausea or vomiting. She has also not had any bowel movements since being in the hospital.   Objective:  Vital signs in last 24 hours: Vitals:   08/26/22 2046 08/26/22 2047 08/27/22 0630 08/27/22 0827  BP: (!) 165/84 (!) 165/84 (!) 158/77 (!) 162/73  Pulse: 68 68 73 73  Resp: '18 20 16 18  '$ Temp:   98.2 F (36.8 C) 98.2 F (36.8 C)  TempSrc:   Oral Oral  SpO2: 100% 100% 100% 99%  Weight:      Height:       Supplemental O2: Room Air SpO2: 99 %   Physical Exam:  Constitutional: no acute distress Cardiovascular: RRR, no murmurs, rubs or gallops, No LE edema  Pulmonary/Chest: normal work of breathing on room air, lungs clear to auscultation bilaterally Abdominal: Soft, non distended, diffuse abdominal tenderness to mild palpation, BS present Skin: warm and dry   Filed Weights   08/24/22 1519  Weight: 110 kg     Intake/Output Summary (Last 24 hours) at 08/27/2022 1222 Last data filed at 08/27/2022 1000 Gross per 24 hour  Intake 840 ml  Output 300 ml  Net 540 ml    Net IO Since Admission: 3,067 mL [08/27/22 1222]  Pertinent Labs:    Latest Ref Rng & Units 08/27/2022    4:46 AM 08/26/2022    7:45 AM 08/25/2022    3:37 AM  CBC  WBC 4.0 - 10.5 K/uL 7.4  6.8  6.1   Hemoglobin 12.0 - 15.0 g/dL 11.7  12.4  11.8   Hematocrit 36.0 - 46.0 % 33.8  36.1  34.8   Platelets 150 - 400 K/uL 229  300  268        Latest Ref Rng & Units 08/27/2022    2:54 AM 08/26/2022    7:45 AM 08/25/2022    3:37 AM  CMP  Glucose 70 - 99 mg/dL 79  100  110   BUN 8 - 23 mg/dL 34  34  32   Creatinine 0.44 - 1.00 mg/dL 2.41   2.34  2.22   Sodium 135 - 145 mmol/L 134  135  136   Potassium 3.5 - 5.1 mmol/L 4.3  4.0  3.3   Chloride 98 - 111 mmol/L 105  103  103   CO2 22 - 32 mmol/L '21  21  24   '$ Calcium 8.9 - 10.3 mg/dL 8.0  7.9  8.0   Total Protein 6.5 - 8.1 g/dL   5.3   Total Bilirubin 0.3 - 1.2 mg/dL   0.5   Alkaline Phos 38 - 126 U/L   96   AST 15 - 41 U/L   15   ALT 0 - 44 U/L   14     Imaging: No results found.  Assessment/Plan:   Principal Problem:   Generalized weakness Active Problems:   Pressure injury of skin   Patient Summary: Alicia [name] is a 71 y.o. with a pertinent PMH of HFpEF, hypertension, diabetes mellitus, CKD 4, PAD, prior CVA, vitamin D  deficiency, who presented with progressive and admitted for decreased functionality and postprandial nausea, vomiting, diarrhea.    #Post Prandial Abdominal Pain with N/V/D Weakness Patient has not had a bowel movement since being hospitalized She also notes she continues to have a poor appetite. She continues to have diffuse abdominal pain for which no organic cause has currently been elucidated. The differential for her abdominal pain includes PUD and gastroparesis, though patient is without N/V. She did have a mesenteric duplex study negative for stenosis. Patient is not able to to tolerate CTA due to GFR being too low (21) and contrast would likely cause a severe kidney injury.  Plan: - F/u Gastric emptying study to assess for gastroparesis  --Encourage shakes  --F/u vit b12, TSH -GI panel has been ordered  #CKDIV Vitamin D deficiency  Anemia of CKD  Renal function remains at be baseline, vitamin D level still pending  #Chronic HFpEF HTN Euvolemic on exam. Continuing her home Lasix, amlodipine, and metoprolol at this time, blood pressure has been relatively stable.  #Type II DM  Sliding scale insulin, continuous blood glucose monitoring  Chronic Pain  OA bilateral shoulders and knees  - Tylenol '650mg'$  q6HR for mild pain  -  Oxycodone '5mg'$  Q6HR for moderate to severe pain   Code: Full  Dispo: Anticipated discharge in less than 3 midnights. Rick Duff, MD PGY-3 Internal Medicine  Pager [phone]

## 2022-08-28 DIAGNOSIS — R112 Nausea with vomiting, unspecified: Secondary | ICD-10-CM | POA: Diagnosis not present

## 2022-08-28 DIAGNOSIS — R531 Weakness: Secondary | ICD-10-CM | POA: Diagnosis not present

## 2022-08-28 DIAGNOSIS — R197 Diarrhea, unspecified: Secondary | ICD-10-CM | POA: Diagnosis not present

## 2022-08-28 LAB — VITAMIN D 25 HYDROXY (VIT D DEFICIENCY, FRACTURES): Vit D, 25-Hydroxy: 21.69 ng/mL — ABNORMAL LOW (ref 30–100)

## 2022-08-28 LAB — GLUCOSE, CAPILLARY
Glucose-Capillary: 86 mg/dL (ref 70–99)
Glucose-Capillary: 134 mg/dL — ABNORMAL HIGH (ref 70–99)
Glucose-Capillary: 84 mg/dL (ref 70–99)
Glucose-Capillary: 79 mg/dL (ref 70–99)

## 2022-08-28 LAB — VITAMIN B12: Vitamin B-12: 1882 pg/mL — ABNORMAL HIGH (ref 180–914)

## 2022-08-28 LAB — TSH: TSH: 1.814 u[IU]/mL (ref 0.350–4.500)

## 2022-08-28 MED ORDER — SUCRALFATE 1 GM/10ML PO SUSP
1.0000 g | Freq: Three times a day (TID) | ORAL | Status: AC
  Administered 2022-08-28 – 2022-08-29 (×4): 1 g via ORAL
  Filled 2022-08-28 (×5): qty 10

## 2022-08-28 MED ORDER — ALUM & MAG HYDROXIDE-SIMETH 200-200-20 MG/5ML PO SUSP
30.0000 mL | Freq: Once | ORAL | Status: AC
  Administered 2022-08-28: 30 mL via ORAL
  Filled 2022-08-28: qty 30

## 2022-08-28 NOTE — Progress Notes (Signed)
Subjective:   Summary: Alicia [name] is a 71 y.o. year old female currently admitted on the IMTS HD#0 for postprandial abdominal pain with nausea and vomiting.  Overnight Events: No overnight events  Patient continues to have poor appetite. She does have some mild nausea as well. She states she is not eating much because she feels it might worsen her abdominal pain. Her abdominal pain is improved but this is better than the day prior and improved with a single dose of oxycodone. She denies nausea or vomiting and has had no diarrhea.   Objective:  Vital signs in last 24 hours: Vitals:   08/27/22 1628 08/27/22 2208 08/28/22 0512 08/28/22 0944  BP: (!) 158/71 (!) 166/85 (!) 157/75 (!) 141/80  Pulse: 65 72 75 77  Resp: '20 17 16 18  '$ Temp: 98 F (36.7 C) 98.4 F (36.9 C) 98.2 F (36.8 C) 98 F (36.7 C)  TempSrc:   Oral Oral  SpO2: 97% 96% 100% 100%  Weight:      Height:       Supplemental O2: Room Air SpO2: 100 %   Physical Exam:  Constitutional: Resting comfortably and in no distress.  Cardiovascular: Normal rate, regular rhythm, intact distal pulses. No gallop and no friction rub.  No murmur heard. No lower extremity edema  Pulmonary: Non labored breathing on room air, no wheezing or rales  Abdominal: Soft. Normal bowel sounds. Non distended. Mild diffuse TTP  Musculoskeletal: Normal range of motion.        General: No tenderness or edema.  Neurological: Alert and oriented to person, place, and time. Non focal  Skin: Skin is warm and dry.    Filed Weights   08/24/22 1519  Weight: 110 kg     Intake/Output Summary (Last 24 hours) at 08/28/2022 1131 Last data filed at 08/28/2022 0900 Gross per 24 hour  Intake 120 ml  Output 1000 ml  Net -880 ml    Net IO Since Admission: 2,187 mL [08/28/22 1131]  Pertinent Labs:    Latest Ref Rng & Units 08/27/2022    4:46 AM 08/26/2022    7:45 AM 08/25/2022    3:37 AM  CBC  WBC 4.0 - 10.5 K/uL 7.4  6.8   6.1   Hemoglobin 12.0 - 15.0 g/dL 11.7  12.4  11.8   Hematocrit 36.0 - 46.0 % 33.8  36.1  34.8   Platelets 150 - 400 K/uL 229  300  268        Latest Ref Rng & Units 08/27/2022    2:54 AM 08/26/2022    7:45 AM 08/25/2022    3:37 AM  CMP  Glucose 70 - 99 mg/dL 79  100  110   BUN 8 - 23 mg/dL 34  34  32   Creatinine 0.44 - 1.00 mg/dL 2.41  2.34  2.22   Sodium 135 - 145 mmol/L 134  135  136   Potassium 3.5 - 5.1 mmol/L 4.3  4.0  3.3   Chloride 98 - 111 mmol/L 105  103  103   CO2 22 - 32 mmol/L '21  21  24   '$ Calcium 8.9 - 10.3 mg/dL 8.0  7.9  8.0   Total Protein 6.5 - 8.1 g/dL   5.3   Total Bilirubin 0.3 - 1.2 mg/dL   0.5   Alkaline Phos 38 - 126 U/L   96  AST 15 - 41 U/L   15   ALT 0 - 44 U/L   14     Imaging: No results found.  Assessment/Plan:   Principal Problem:   Generalized weakness Active Problems:   Pressure injury of skin   Nausea and vomiting   Diarrhea   Patient Summary: Alicia [name] is a 71 y.o. with a pertinent PMH of HFpEF, hypertension, diabetes mellitus, CKD 4, PAD, prior CVA, vitamin D deficiency, who presented with progressive and admitted for decreased functionality and postprandial nausea, vomiting, diarrhea.    #Post Prandial Abdominal Pain with N/V/D Weakness Patient continues to have a poor appetite. She also continues to have diffuse abdominal pain for which no organic cause has been found. The pain does seem to improve with oxycodone.. The differential for her abdominal pain includes PUD and gastroparesis, though patient is without N/V currently and her primary presenting symptom was diarrhea. She did have a mesenteric duplex study negative for stenosis. Patient is not able to to tolerate CTA due to GFR being too low (21) and contrast would likely cause a severe kidney injury.  Plan: -F/u Gastric emptying study to assess for gastroparesis, hold on opioids for gastric study  --Encourage shakes  -GI panel has been ordered --F/u fecal  elastase  --F/u vit b12, TSH  #CKDIV Vitamin D deficiency  Anemia of CKD  Renal function remains at baseline, vitamin D level pending.  #Chronic HFpEF HTN Euvolemic on exam. Continuing her home Lasix, amlodipine, and metoprolol at this time, blood pressure has been stable but remains elevated in the 150 to 160s. May consider transitioning patient to coreg for further blood pressure control.   #Type II DM  Sliding scale insulin, continuous blood glucose monitoring. Well controlled though not taking in much oral.   Chronic Pain  OA bilateral shoulders and knees  - Tylenol '650mg'$  q6HR for mild pain  - Hold Oxycodone oxycodone for now.   Code: Full  Dispo: SNF   Rick Duff, MD PGY-3 Internal Medicine  Pager [phone]

## 2022-08-28 NOTE — Progress Notes (Signed)
NM Gastric Emptying study ordered on the afternoon of Friday 08/26/22 at 13:15. Pt had eaten breakfast and lunch. Given it is protocol for pt to be npo for 4-6 hrs prior to exam, this study could not be done on ordered date. This NM Tech spoke with RN Ebony Hail today, pt is to be npo after midnight tonight (08/28/22) and the NM Gastric Emptying study is to be done on Monday 08/29/22.

## 2022-08-28 NOTE — Plan of Care (Signed)
  Problem: Clinical Measurements: Goal: Respiratory complications will improve Outcome: Progressing Goal: Cardiovascular complication will be avoided Outcome: Progressing   Problem: Pain Managment: Goal: General experience of comfort will improve Outcome: Progressing   Problem: Activity: Goal: Risk for activity intolerance will decrease Outcome: Not Progressing   Problem: Nutrition: Goal: Adequate nutrition will be maintained Outcome: Not Progressing

## 2022-08-29 DIAGNOSIS — R112 Nausea with vomiting, unspecified: Secondary | ICD-10-CM | POA: Diagnosis not present

## 2022-08-29 DIAGNOSIS — R197 Diarrhea, unspecified: Secondary | ICD-10-CM | POA: Diagnosis not present

## 2022-08-29 DIAGNOSIS — R531 Weakness: Secondary | ICD-10-CM | POA: Diagnosis not present

## 2022-08-29 LAB — GLUCOSE, CAPILLARY
Glucose-Capillary: 79 mg/dL (ref 70–99)
Glucose-Capillary: 75 mg/dL (ref 70–99)
Glucose-Capillary: 107 mg/dL — ABNORMAL HIGH (ref 70–99)
Glucose-Capillary: 92 mg/dL (ref 70–99)
Glucose-Capillary: 123 mg/dL — ABNORMAL HIGH (ref 70–99)

## 2022-08-29 MED ORDER — DEXTROSE IN LACTATED RINGERS 5 % IV SOLN: INTRAVENOUS | Status: AC

## 2022-08-29 MED ORDER — METOCLOPRAMIDE HCL 5 MG/ML IJ SOLN
5.0000 mg | Freq: Three times a day (TID) | INTRAMUSCULAR | Status: DC
  Administered 2022-08-29 – 2022-08-30 (×2): 5 mg via INTRAVENOUS
  Filled 2022-08-29 (×3): qty 2

## 2022-08-29 MED ORDER — LACTATED RINGERS IV BOLUS
500.0000 mL | Freq: Once | INTRAVENOUS | Status: AC
  Administered 2022-08-29: 500 mL via INTRAVENOUS

## 2022-08-29 MED ORDER — LACTATED RINGERS IV SOLN: INTRAVENOUS | Status: DC

## 2022-08-29 MED ORDER — INSULIN ASPART 100 UNIT/ML IJ SOLN
0.0000 [IU] | INTRAMUSCULAR | Status: DC
  Administered 2022-08-29: 1 [IU] via SUBCUTANEOUS
  Administered 2022-08-30: 3 [IU] via SUBCUTANEOUS

## 2022-08-29 MED ORDER — HYDROMORPHONE HCL 1 MG/ML IJ SOLN
0.1000 mg | INTRAMUSCULAR | Status: DC | PRN
  Administered 2022-08-29 – 2022-08-31 (×5): 0.1 mg via INTRAVENOUS
  Filled 2022-08-29 (×5): qty 1

## 2022-08-29 NOTE — Progress Notes (Signed)
Alicia Juniper, MD, on call for IM, made aware of glucose 79 this AM.  HS glucose was also 79 and pt did drink about 1/2 of Glucerna at bedtime.  She has been NPO after MN for Gastric Emptying study.  Pt states she doesn't feel like her glucose is low.  Awaiting f/u orders at this time. [name] BSN RN [place] 08/29/2022, 6:34 AM

## 2022-08-29 NOTE — TOC Progression Note (Signed)
Transition of Care [facility]) - Progression Note    Patient Details  Name: Alicia [name] MRN: [id number] Date of Birth: 1951-04-23  Transition of Care [facility]) CM/SW [place], LCSW Phone Number: 08/29/2022, 2:44 PM  Clinical Narrative:    Patient's only offers are [facility] (willing to bill copays to patient) and [facility] (requires patient to sign her check over to them). Will present to patient.    Expected Discharge Plan: Skilled Nursing Facility Barriers to Discharge: Ship broker, SNF Pending bed offer, Continued Medical Work up  Expected Discharge Plan and Services Expected Discharge Plan: [place] Choice: [place] arrangements for the past 2 months: Single Family Home                                       Social Determinants of Health (SDOH) Interventions    Readmission Risk Interventions    03/02/2020    1:08 PM  Readmission Risk Prevention Plan  Transportation Screening Complete  PCP or Specialist Appt within 5-7 Days Complete  Home Care Screening Complete  Medication Review (RN CM) Complete

## 2022-08-29 NOTE — Progress Notes (Cosign Needed Addendum)
Subjective:   Summary: Alicia [name] is a 71 y.o. year old female currently admitted on the IMTS HD#0 for postprandial abdominal pain with nausea and vomiting.  Overnight Events: NOE   Pt was seen bedside this AM. She is still endorsing diffuse abdominal pain throughout her abdomen. She has not been able to eat anything throughout her hospital course as she is worried it might exacerbate the pain, and thus has not had a bowel movement.   Objective:  Vital signs in last 24 hours: Vitals:   08/28/22 2029 08/28/22 2109 08/29/22 0509 08/29/22 0810  BP: (!) 154/78 (!) 146/69 (!) 140/73 (!) 146/70  Pulse: 70 65 76 68  Resp: '18 18 18 17  '$ Temp: 98.2 F (36.8 C) 98.4 F (36.9 C) 98.4 F (36.9 C) 98.3 F (36.8 C)  TempSrc: Oral   Oral  SpO2: 100% 99% 100% 98%  Weight:      Height:       Supplemental O2: Room Air SpO2: 98 %   Physical Exam:  Constitutional: Elderly tearful anxious woman with low mood and in pain Cardiovascular: RRR, no murmurs, rubs or gallops, no carotid murmurs heard bilaterally Pulmonary/Chest: normal work of breathing on room air, lungs clear to auscultation bilaterally Abdominal: Normal bowel sounds heard, TTP throughout abdomen  Skin: warm and dry Extremities: upper/lower extremity pulses 2+, no lower extremity edema present  Filed Weights   08/24/22 1519  Weight: 110 kg     Intake/Output Summary (Last 24 hours) at 08/29/2022 1232 Last data filed at 08/29/2022 0900 Gross per 24 hour  Intake 240 ml  Output 250 ml  Net -10 ml   Net IO Since Admission: 2,417 mL [08/29/22 1232]  Pertinent Labs:    Latest Ref Rng & Units 08/27/2022    4:46 AM 08/26/2022    7:45 AM 08/25/2022    3:37 AM  CBC  WBC 4.0 - 10.5 K/uL 7.4  6.8  6.1   Hemoglobin 12.0 - 15.0 g/dL 11.7  12.4  11.8   Hematocrit 36.0 - 46.0 % 33.8  36.1  34.8   Platelets 150 - 400 K/uL 229  300  268        Latest Ref Rng & Units 08/27/2022    2:54 AM 08/26/2022     7:45 AM 08/25/2022    3:37 AM  CMP  Glucose 70 - 99 mg/dL 79  100  110   BUN 8 - 23 mg/dL 34  34  32   Creatinine 0.44 - 1.00 mg/dL 2.41  2.34  2.22   Sodium 135 - 145 mmol/L 134  135  136   Potassium 3.5 - 5.1 mmol/L 4.3  4.0  3.3   Chloride 98 - 111 mmol/L 105  103  103   CO2 22 - 32 mmol/L '21  21  24   '$ Calcium 8.9 - 10.3 mg/dL 8.0  7.9  8.0   Total Protein 6.5 - 8.1 g/dL   5.3   Total Bilirubin 0.3 - 1.2 mg/dL   0.5   Alkaline Phos 38 - 126 U/L   96   AST 15 - 41 U/L   15   ALT 0 - 44 U/L   14     Assessment/Plan:   Principal Problem:   Generalized weakness Active Problems:   Pressure injury of skin   Nausea and vomiting   Diarrhea   Patient Summary:  Alicia [name] is a 71 y.o. with a pertinent PMH of HFpEF, hypertension, diabetes mellitus, CKD 4, PAD, prior CVA, vitamin D Deficiency, who presented with progressive weakness and admitted for decreased functional and postprandial nausea, vomiting, diarrhea.    #Post Prandial Abdominal Pain with N/V/D #Weakness Pt is currently NPO for gastric emptying study today. She is still experiencing diffuse abdominal pain that has not changed since admission. She has not had a bowel movement at this time, as she has not eaten any full meals since being admitted to the hospital, thus unable to collect fecal elastase and GI panel Mesenteric Duplex study was negative for stenosis, and patient is not able to tolerate CTA due to GFR of 21, and contrast would likely cause a severe kidney injury.   Current leading diagnosis is gastroparesis in the setting of her diabetes, albeit well controlled, will re-evaluate after gastric emptying study, in the meanwhile we will treat symptomatically.   Plan:  - Gastric emptying study planned for today, patient is currently NPO - Dilaudid .'1mg'$  IV added to regimen, cannot use opioids at this time because it will interfere with study - If gastric emptying study is negative, and patient is still  experiencing symptoms, next step would be endoscopy, and potential GI consult.  - Will start maintenance fluid with dextrose 5% in LR as patient has not been drinking any fluids - F/U Fecal elastase and GI Panel  - Will start metoclopramide '5mg'$  Q8H to help stimulate bowels  #CKDIV #Vitamin D Deficiency  #Anemia of CKD  Renal function remains at baseline, last Creatinine was 2.41, which seems to be around baseline  #Chronic HFpEF #HTN Pt has continued to be euvolemic on exam. We will continue her lasix, amlodipine, and metoprolol   #Type II DM  SSI   Diet: NPO IVF: LR,100cc/hr  Code: Full   Dispo: Anticipated discharge in less than three midnights.  Drucie Opitz, MD PGY-1 Internal Medicine Resident Pager Number [phone] Please contact the on call pager after 5 pm and on weekends at [phone].

## 2022-08-29 NOTE — Progress Notes (Signed)
PT Cancellation Note  Patient Details Name: Alicia [name] MRN: [id number] DOB: 1951/06/25   Cancelled Treatment:    Reason Eval/Treat Not Completed: Medical issues which prohibited therapy  Patient reporting too much abdominal pain. Awaiting if they will give her pain meds (currently NPO for gastric emptying study to be done today).    [place]  Office [phone]   Rexanne Mano 08/29/2022, 11:09 AM

## 2022-08-30 ENCOUNTER — Encounter: Payer: Self-pay | Admitting: Internal Medicine

## 2022-08-30 ENCOUNTER — Observation Stay (HOSPITAL_COMMUNITY): Payer: Medicare HMO

## 2022-08-30 DIAGNOSIS — R0609 Other forms of dyspnea: Secondary | ICD-10-CM | POA: Diagnosis not present

## 2022-08-30 DIAGNOSIS — F32A Depression, unspecified: Secondary | ICD-10-CM | POA: Diagnosis not present

## 2022-08-30 DIAGNOSIS — Z6841 Body Mass Index (BMI) 40.0 and over, adult: Secondary | ICD-10-CM | POA: Diagnosis not present

## 2022-08-30 DIAGNOSIS — R1084 Generalized abdominal pain: Secondary | ICD-10-CM | POA: Diagnosis not present

## 2022-08-30 DIAGNOSIS — R109 Unspecified abdominal pain: Secondary | ICD-10-CM | POA: Diagnosis not present

## 2022-08-30 DIAGNOSIS — R531 Weakness: Secondary | ICD-10-CM | POA: Diagnosis present

## 2022-08-30 DIAGNOSIS — R112 Nausea with vomiting, unspecified: Secondary | ICD-10-CM | POA: Diagnosis not present

## 2022-08-30 DIAGNOSIS — R627 Adult failure to thrive: Secondary | ICD-10-CM | POA: Diagnosis not present

## 2022-08-30 DIAGNOSIS — E871 Hypo-osmolality and hyponatremia: Secondary | ICD-10-CM | POA: Diagnosis present

## 2022-08-30 DIAGNOSIS — M7989 Other specified soft tissue disorders: Secondary | ICD-10-CM | POA: Diagnosis not present

## 2022-08-30 DIAGNOSIS — E46 Unspecified protein-calorie malnutrition: Secondary | ICD-10-CM | POA: Diagnosis not present

## 2022-08-30 DIAGNOSIS — K449 Diaphragmatic hernia without obstruction or gangrene: Secondary | ICD-10-CM | POA: Diagnosis not present

## 2022-08-30 DIAGNOSIS — N390 Urinary tract infection, site not specified: Secondary | ICD-10-CM | POA: Diagnosis present

## 2022-08-30 DIAGNOSIS — M1990 Unspecified osteoarthritis, unspecified site: Secondary | ICD-10-CM | POA: Diagnosis not present

## 2022-08-30 DIAGNOSIS — D631 Anemia in chronic kidney disease: Secondary | ICD-10-CM | POA: Diagnosis present

## 2022-08-30 DIAGNOSIS — E1110 Type 2 diabetes mellitus with ketoacidosis without coma: Secondary | ICD-10-CM | POA: Diagnosis not present

## 2022-08-30 DIAGNOSIS — E1143 Type 2 diabetes mellitus with diabetic autonomic (poly)neuropathy: Secondary | ICD-10-CM | POA: Diagnosis present

## 2022-08-30 DIAGNOSIS — G9340 Encephalopathy, unspecified: Secondary | ICD-10-CM | POA: Diagnosis not present

## 2022-08-30 DIAGNOSIS — L89151 Pressure ulcer of sacral region, stage 1: Secondary | ICD-10-CM | POA: Diagnosis present

## 2022-08-30 DIAGNOSIS — K8689 Other specified diseases of pancreas: Secondary | ICD-10-CM | POA: Diagnosis not present

## 2022-08-30 DIAGNOSIS — R52 Pain, unspecified: Secondary | ICD-10-CM | POA: Diagnosis not present

## 2022-08-30 DIAGNOSIS — K2289 Other specified disease of esophagus: Secondary | ICD-10-CM | POA: Diagnosis not present

## 2022-08-30 DIAGNOSIS — I5032 Chronic diastolic (congestive) heart failure: Secondary | ICD-10-CM | POA: Diagnosis present

## 2022-08-30 DIAGNOSIS — L97129 Non-pressure chronic ulcer of left thigh with unspecified severity: Secondary | ICD-10-CM | POA: Diagnosis present

## 2022-08-30 DIAGNOSIS — I130 Hypertensive heart and chronic kidney disease with heart failure and stage 1 through stage 4 chronic kidney disease, or unspecified chronic kidney disease: Secondary | ICD-10-CM | POA: Diagnosis not present

## 2022-08-30 DIAGNOSIS — M47816 Spondylosis without myelopathy or radiculopathy, lumbar region: Secondary | ICD-10-CM | POA: Diagnosis not present

## 2022-08-30 DIAGNOSIS — E1122 Type 2 diabetes mellitus with diabetic chronic kidney disease: Secondary | ICD-10-CM | POA: Diagnosis present

## 2022-08-30 DIAGNOSIS — E11649 Type 2 diabetes mellitus with hypoglycemia without coma: Secondary | ICD-10-CM | POA: Diagnosis not present

## 2022-08-30 DIAGNOSIS — Z4682 Encounter for fitting and adjustment of non-vascular catheter: Secondary | ICD-10-CM | POA: Diagnosis not present

## 2022-08-30 DIAGNOSIS — E860 Dehydration: Secondary | ICD-10-CM | POA: Diagnosis present

## 2022-08-30 DIAGNOSIS — I509 Heart failure, unspecified: Secondary | ICD-10-CM | POA: Diagnosis not present

## 2022-08-30 DIAGNOSIS — R638 Other symptoms and signs concerning food and fluid intake: Secondary | ICD-10-CM | POA: Diagnosis not present

## 2022-08-30 DIAGNOSIS — K269 Duodenal ulcer, unspecified as acute or chronic, without hemorrhage or perforation: Secondary | ICD-10-CM | POA: Diagnosis present

## 2022-08-30 DIAGNOSIS — E43 Unspecified severe protein-calorie malnutrition: Secondary | ICD-10-CM | POA: Diagnosis present

## 2022-08-30 DIAGNOSIS — I5030 Unspecified diastolic (congestive) heart failure: Secondary | ICD-10-CM | POA: Diagnosis not present

## 2022-08-30 DIAGNOSIS — B967 Clostridium perfringens [C. perfringens] as the cause of diseases classified elsewhere: Secondary | ICD-10-CM | POA: Diagnosis not present

## 2022-08-30 DIAGNOSIS — I132 Hypertensive heart and chronic kidney disease with heart failure and with stage 5 chronic kidney disease, or end stage renal disease: Secondary | ICD-10-CM | POA: Diagnosis present

## 2022-08-30 DIAGNOSIS — F321 Major depressive disorder, single episode, moderate: Secondary | ICD-10-CM | POA: Diagnosis present

## 2022-08-30 DIAGNOSIS — L97119 Non-pressure chronic ulcer of right thigh with unspecified severity: Secondary | ICD-10-CM | POA: Diagnosis present

## 2022-08-30 DIAGNOSIS — F17210 Nicotine dependence, cigarettes, uncomplicated: Secondary | ICD-10-CM | POA: Diagnosis not present

## 2022-08-30 DIAGNOSIS — R140 Abdominal distension (gaseous): Secondary | ICD-10-CM | POA: Diagnosis not present

## 2022-08-30 DIAGNOSIS — N179 Acute kidney failure, unspecified: Secondary | ICD-10-CM | POA: Diagnosis not present

## 2022-08-30 DIAGNOSIS — G8929 Other chronic pain: Secondary | ICD-10-CM | POA: Diagnosis not present

## 2022-08-30 DIAGNOSIS — K529 Noninfective gastroenteritis and colitis, unspecified: Secondary | ICD-10-CM | POA: Diagnosis present

## 2022-08-30 DIAGNOSIS — K2990 Gastroduodenitis, unspecified, without bleeding: Secondary | ICD-10-CM | POA: Diagnosis not present

## 2022-08-30 DIAGNOSIS — K3184 Gastroparesis: Secondary | ICD-10-CM | POA: Diagnosis present

## 2022-08-30 DIAGNOSIS — K319 Disease of stomach and duodenum, unspecified: Secondary | ICD-10-CM | POA: Diagnosis not present

## 2022-08-30 DIAGNOSIS — E8809 Other disorders of plasma-protein metabolism, not elsewhere classified: Secondary | ICD-10-CM | POA: Diagnosis present

## 2022-08-30 DIAGNOSIS — K2980 Duodenitis without bleeding: Secondary | ICD-10-CM | POA: Diagnosis not present

## 2022-08-30 DIAGNOSIS — N185 Chronic kidney disease, stage 5: Secondary | ICD-10-CM | POA: Diagnosis present

## 2022-08-30 DIAGNOSIS — K3189 Other diseases of stomach and duodenum: Secondary | ICD-10-CM | POA: Diagnosis not present

## 2022-08-30 DIAGNOSIS — R945 Abnormal results of liver function studies: Secondary | ICD-10-CM | POA: Diagnosis not present

## 2022-08-30 DIAGNOSIS — E876 Hypokalemia: Secondary | ICD-10-CM | POA: Diagnosis not present

## 2022-08-30 DIAGNOSIS — I110 Hypertensive heart disease with heart failure: Secondary | ICD-10-CM | POA: Diagnosis not present

## 2022-08-30 DIAGNOSIS — K439 Ventral hernia without obstruction or gangrene: Secondary | ICD-10-CM | POA: Diagnosis not present

## 2022-08-30 DIAGNOSIS — D649 Anemia, unspecified: Secondary | ICD-10-CM | POA: Diagnosis not present

## 2022-08-30 DIAGNOSIS — I351 Nonrheumatic aortic (valve) insufficiency: Secondary | ICD-10-CM | POA: Diagnosis present

## 2022-08-30 DIAGNOSIS — Z515 Encounter for palliative care: Secondary | ICD-10-CM | POA: Diagnosis not present

## 2022-08-30 DIAGNOSIS — Z66 Do not resuscitate: Secondary | ICD-10-CM | POA: Diagnosis present

## 2022-08-30 DIAGNOSIS — I700 Atherosclerosis of aorta: Secondary | ICD-10-CM | POA: Diagnosis present

## 2022-08-30 DIAGNOSIS — K5900 Constipation, unspecified: Secondary | ICD-10-CM | POA: Diagnosis not present

## 2022-08-30 DIAGNOSIS — N184 Chronic kidney disease, stage 4 (severe): Secondary | ICD-10-CM | POA: Diagnosis not present

## 2022-08-30 LAB — BASIC METABOLIC PANEL
Anion gap: 9 (ref 5–15)
BUN: 37 mg/dL — ABNORMAL HIGH (ref 8–23)
CO2: 24 mmol/L (ref 22–32)
Calcium: 8 mg/dL — ABNORMAL LOW (ref 8.9–10.3)
Chloride: 102 mmol/L (ref 98–111)
Creatinine, Ser: 2.53 mg/dL — ABNORMAL HIGH (ref 0.44–1.00)
GFR, Estimated: 20 mL/min — ABNORMAL LOW (ref >60)
Glucose, Bld: 61 mg/dL — ABNORMAL LOW (ref 70–99)
Potassium: 3.4 mmol/L — ABNORMAL LOW (ref 3.5–5.1)
Sodium: 135 mmol/L (ref 135–145)

## 2022-08-30 LAB — GLUCOSE, CAPILLARY
Glucose-Capillary: 136 mg/dL — ABNORMAL HIGH (ref 70–99)
Glucose-Capillary: 172 mg/dL — ABNORMAL HIGH (ref 70–99)
Glucose-Capillary: 214 mg/dL — ABNORMAL HIGH (ref 70–99)
Glucose-Capillary: 57 mg/dL — ABNORMAL LOW (ref 70–99)
Glucose-Capillary: 70 mg/dL (ref 70–99)
Glucose-Capillary: 75 mg/dL (ref 70–99)
Glucose-Capillary: 96 mg/dL (ref 70–99)

## 2022-08-30 LAB — CBC
HCT: 31.4 % — ABNORMAL LOW (ref 36.0–46.0)
Hemoglobin: 10.5 g/dL — ABNORMAL LOW (ref 12.0–15.0)
MCH: 31.8 pg (ref 26.0–34.0)
MCHC: 33.4 g/dL (ref 30.0–36.0)
MCV: 95.2 fL (ref 80.0–100.0)
Platelets: 227 10*3/uL (ref 150–400)
RBC: 3.3 MIL/uL — ABNORMAL LOW (ref 3.87–5.11)
RDW: 16.4 % — ABNORMAL HIGH (ref 11.5–15.5)
WBC: 6.7 10*3/uL (ref 4.0–10.5)
nRBC: 0 % (ref 0.0–0.2)

## 2022-08-30 MED ORDER — DEXTROSE IN LACTATED RINGERS 5 % IV SOLN: INTRAVENOUS | Status: AC

## 2022-08-30 MED ORDER — INSULIN ASPART 100 UNIT/ML IJ SOLN
0.0000 [IU] | INTRAMUSCULAR | Status: DC
  Administered 2022-08-30 – 2022-09-03 (×7): 1 [IU] via SUBCUTANEOUS
  Administered 2022-09-03: 2 [IU] via SUBCUTANEOUS
  Administered 2022-09-04 – 2022-09-07 (×6): 1 [IU] via SUBCUTANEOUS
  Administered 2022-09-08: 2 [IU] via SUBCUTANEOUS
  Administered 2022-09-08 – 2022-09-09 (×7): 1 [IU] via SUBCUTANEOUS
  Administered 2022-09-09: 2 [IU] via SUBCUTANEOUS
  Administered 2022-09-09 – 2022-09-10 (×4): 1 [IU] via SUBCUTANEOUS

## 2022-08-30 MED ORDER — METOCLOPRAMIDE HCL 5 MG PO TABS: 5.0000 mg | ORAL_TABLET | Freq: Three times a day (TID) | ORAL | Status: DC

## 2022-08-30 MED ORDER — METOCLOPRAMIDE HCL 5 MG PO TABS
5.0000 mg | ORAL_TABLET | Freq: Three times a day (TID) | ORAL | Status: DC
  Administered 2022-08-30 – 2022-08-31 (×4): 5 mg via ORAL
  Filled 2022-08-30 (×4): qty 1

## 2022-08-30 MED ORDER — POTASSIUM CHLORIDE 20 MEQ PO PACK
40.0000 meq | PACK | Freq: Once | ORAL | Status: AC
  Administered 2022-08-30: 40 meq via ORAL
  Filled 2022-08-30: qty 2

## 2022-08-30 NOTE — TOC Progression Note (Signed)
Transition of Care [facility]) - Initial/Assessment Note    Patient Details  Name: Alicia [name] MRN: [id number] Date of Birth: 06-29-1951  Transition of Care [facility]) CM/SW Contact:    Milinda Antis, [place] Phone Number: 08/30/2022, 11:50 AM  Clinical Narrative:                 CSW met with patient at bedside and presented bed offers.  The patient reports that she needs to think about whether she wants to go to a SNF prior to choosing a facility.  TOC will continue to follow.    Expected Discharge Plan: Skilled Nursing Facility Barriers to Discharge: Insurance Authorization, SNF Pending bed offer, Continued Medical Work up   Patient Goals and CMS Choice Patient states their goals for this hospitalization and ongoing recovery are:: Pt wants to be able to return home. CMS Medicare.gov Compare Post Acute Care list provided to:: Patient Choice offered to / list presented to : Patient  Expected Discharge Plan and Services Expected Discharge Plan: [place] Acute Care Choice: [place] arrangements for the past 2 months: Single Family Home                                      Prior Living Arrangements/Services Living arrangements for the past 2 months: Single Family Home Lives with:: Self Patient language and need for interpreter reviewed:: Yes Do you feel safe going back to the place where you live?: Yes      Need for Family Participation in Patient Care: Yes (Comment) Care giver support system in place?: No (comment)   Criminal Activity/Legal Involvement Pertinent to Current Situation/Hospitalization: No - Comment as needed  Activities of Daily Living Home Assistive Devices/Equipment: Walker (specify type) ADL Screening (condition at time of admission) Patient's cognitive ability adequate to safely complete daily activities?: Yes Is the patient deaf or have difficulty hearing?: No Does the patient have difficulty  seeing, even when wearing glasses/contacts?: No Does the patient have difficulty concentrating, remembering, or making decisions?: No Patient able to express need for assistance with ADLs?: Yes Does the patient have difficulty dressing or bathing?: Yes Independently performs ADLs?: No Communication: Independent Dressing (OT): Needs assistance Is this a change from baseline?: Pre-admission baseline Grooming: Needs assistance Is this a change from baseline?: Pre-admission baseline Feeding: Independent Bathing: Needs assistance Is this a change from baseline?: Pre-admission baseline Toileting: Independent with device (comment) In/Out Bed: Independent with device (comment) Walks in Home: Independent with device (comment) Does the patient have difficulty walking or climbing stairs?: Yes Weakness of Legs: Both Weakness of Arms/Hands: None  Permission Sought/Granted Permission sought to share information with : Family Supports Permission granted to share information with : No              Emotional Assessment Appearance:: Appears stated age Attitude/Demeanor/Rapport: Guarded Affect (typically observed): Flat Orientation: : Oriented to Self, Oriented to Place, Oriented to  Time, Oriented to Situation Alcohol / Substance Use: Not Applicable Psych Involvement: No (comment)  Admission diagnosis:  Generalized weakness [R53.1] Patient Active Problem List   Diagnosis Date Noted   Nausea and vomiting    Diarrhea    Pressure injury of skin 08/27/2022   Generalized weakness 08/24/2022   Acute exacerbation of congestive heart failure ([place]) 06/30/2022   Hypernatremia 06/30/2022   Hyperkalemia 06/17/2022   Normocytic anemia 06/17/2022  Intertrigo    Metabolic acidosis    Cellulitis 05/20/2022   Vitamin D deficiency 05/20/2022   Headache 02/16/2022   Disequilibrium 12/01/2021   (HFpEF) heart failure with preserved ejection fraction ([place]) 07/18/2021   Exacerbation of RAD (reactive  airway disease) 02/27/2020   Stage 4 chronic kidney disease ([place]) 02/27/2020   Tobacco use 02/27/2020   Prolonged Q-T interval on ECG 04/27/2018   Diabetes mellitus type 2 in obese ([place]) 03/16/2018   Severe obesity (BMI >= 40) (HCC)    CVA (cerebral vascular accident) ([place]) 03/15/2018   Peripheral vascular disease, unspecified ([place]) 11/30/2012   Hyperlipidemia 07/20/2007   Primary hypertension 07/20/2007   PCP:  Rick Duff, MD Pharmacy:   [place] ([place] [place] [facility] [place] [address] [place] ([place]) [place] [address] Phone: [phone] Fax: [phone]     Social Determinants of Health (SDOH) Interventions    Readmission Risk Interventions    03/02/2020    1:08 PM  Readmission Risk Prevention Plan  Transportation Screening Complete  PCP or Specialist Appt within 5-7 Days Complete  Home Care Screening Complete  Medication Review (RN CM) Complete

## 2022-08-30 NOTE — Progress Notes (Signed)
Physical Therapy Treatment Patient Details Name: Alicia [name] MRN: [id number] DOB: 1951/07/11 Today's Date: 08/30/2022   History of Present Illness 71 y.o. female presented to ED 10/4 with FTT. PMhx: CHf, HTN, CVA, T2DM, CKD, vit D deficiency, PAD, depression, HFpEF    PT Comments    Pt supine in bed on entry, covered up and reluctant to get up with therapy, obviously upset about weakness with walking with PT in ED. Assured pt PT/OT would be there and not let anything happen to her. Pt is requiring modA for bed mobility and modAx2 for two attempts to take steps towards HoB. PT recommending SNF however pt refusing despite lacking strength to take care of herself. PT will continue to see pt acutely.   Recommendations for follow up therapy are one component of a multi-disciplinary discharge planning process, led by the attending physician.  Recommendations may be updated based on patient status, additional functional criteria and insurance authorization.  Follow Up Recommendations  Skilled nursing-short term rehab (<3 hours/day) Can patient physically be transported by private vehicle: No   Assistance Recommended at Discharge Frequent or constant Supervision/Assistance  Patient can return home with the following A little help with walking and/or transfers;Assistance with cooking/housework;Direct supervision/assist for medications management;Assist for transportation;A lot of help with bathing/dressing/bathroom   Equipment Recommendations  None recommended by PT       Precautions / Restrictions Precautions Precautions: Fall Restrictions Weight Bearing Restrictions: No     Mobility  Bed Mobility Overal bed mobility: Needs Assistance Bed Mobility: Supine to Sit, Sit to Supine Rolling: Min assist   Supine to sit: Mod assist, Min assist Sit to supine: Mod assist   General bed mobility comments: min A for pad scoot of hips, pt with use of bed rail to pull to EoB, requires  modAx2 for return of LE to bed    Transfers Overall transfer level: Needs assistance Equipment used: 2 person hand held assist Transfers: Sit to/from Stand Sit to Stand: Mod assist, +2 physical assistance           General transfer comment: mod assist +2 to stand from EOB due to pain with limited standing tolerance.  Patient appeared to fear of falling stating, "Don't let me fall"    Ambulation/Gait Ambulation/Gait assistance: Min assist   Assistive device: Rolling walker (2 wheels) Gait Pattern/deviations: Step-through pattern, Decreased stride length, Trunk flexed       General Gait Details: cues for posture and proximity to RW. PT walked to end of bed and stated need to turn around. Pt given cues to turn to return to bed but pt stated weakness and unable to move further. Pt ultimately sat on therapist thigh and attempted x 2 with RN and NT to have pt stand as no chair nearby but pt unable and lowered to floor. Maximove for transition from floor to bed     Balance Overall balance assessment: Needs assistance Sitting-balance support: No upper extremity supported, Feet supported Sitting balance-Leahy Scale: Fair Sitting balance - Comments: EOB   Standing balance support: Bilateral upper extremity supported, During functional activity Standing balance-Leahy Scale: Poor Standing balance comment: reliant on Therapists for support                            Cognition Arousal/Alertness: Awake/alert Behavior During Therapy: WFL for tasks assessed/performed Overall Cognitive Status: Impaired/Different from baseline Area of Impairment: Following commands, Safety/judgement, Problem solving  Following Commands: Follows one step commands inconsistently, Follows one step commands with increased time Safety/Judgement: Decreased awareness of safety, Decreased awareness of deficits   Problem Solving: Slow processing, Requires verbal  cues General Comments: pt reports not wanting to go to SNF but is also aware that she can not safely stay at home           General Comments  Pt obviously upset about decline in her ability to mobilize on her own.      Pertinent Vitals/Pain Pain Assessment Pain Assessment: Faces Faces Pain Scale: Hurts little more Pain Location: abdomen Pain Descriptors / Indicators: Discomfort, Grimacing Pain Intervention(s): Monitored during session, Repositioned, Limited activity within patient's tolerance     PT Goals (current goals can now be found in the care plan section) Acute Rehab PT Goals Patient Stated Goal: return to walking and feeling ok PT Goal Formulation: With patient Time For Goal Achievement: 09/08/22 Potential to Achieve Goals: Fair Progress towards PT goals: Progressing toward goals    Frequency    Min 3X/week      PT Plan Current plan remains appropriate    Co-evaluation PT/OT/SLP Co-Evaluation/Treatment: Yes Reason for Co-Treatment: For patient/therapist safety PT goals addressed during session: Mobility/safety with mobility OT goals addressed during session: ADL's and self-care      AM-PAC PT "6 Clicks" Mobility   Outcome Measure  Help needed turning from your back to your side while in a flat bed without using bedrails?: A Little Help needed moving from lying on your back to sitting on the side of a flat bed without using bedrails?: A Little Help needed moving to and from a bed to a chair (including a wheelchair)?: A Little Help needed standing up from a chair using your arms (e.g., wheelchair or bedside chair)?: A Little Help needed to walk in hospital room?: A Lot Help needed climbing 3-5 steps with a railing? : Total 6 Click Score: 15    End of Session Equipment Utilized During Treatment: Gait belt Activity Tolerance: Patient tolerated treatment well Patient left: in bed;with call bell/phone within reach;with nursing/sitter in room Nurse  Communication: Mobility status PT Visit Diagnosis: Other abnormalities of gait and mobility (R26.89);History of falling (Z91.81);Muscle weakness (generalized) (M62.81)     Time: 2202-5427 PT Time Calculation (min) (ACUTE ONLY): 28 min  Charges:  $Therapeutic Activity: 8-22 mins                     Abbygael Curtiss B. Migdalia Dk PT, DPT Acute Rehabilitation Services Please use secure chat or  Call Office [phone]    [place] 08/30/2022, 3:58 PM

## 2022-08-30 NOTE — Progress Notes (Signed)
Hypoglycemic Event  CBG: 57  Treatment: rate change of 150 mL/hr of dextrose 5% in lactated ringers from previous 100 mL/hr dextrose 5% in lactated ringers  Symptoms: None  Follow-up CBG: Time: 0627 CBG Result: 70  Comments/MD notified: Webb Laws  MD notified   [facility]

## 2022-08-30 NOTE — Progress Notes (Signed)
Subjective:   Summary: Alicia [name] is a 71 y.o. year old female currently admitted on the IMTS HD#0 for postprandial abdominal pain with nausea and vomiting.  Overnight Events: NOE   Pt was seen bedside this AM> She is still endorsing abdominal pain throughout her abdomen, and has still not been able to eat anything because she continues to worry about exacerbation of pain. No bowel movement throughout course of hospital stay either. She remains with a low mood, and state metoclopramide did not help.   Objective:  Vital signs in last 24 hours: Vitals:   08/29/22 1556 08/29/22 2109 08/30/22 0448 08/30/22 1003  BP: (!) 151/77 (!) 143/76 126/67 (!) 149/74  Pulse: 76 80 74 72  Resp: '16 18 18 18  '$ Temp: 98.4 F (36.9 C) 98.4 F (36.9 C) 98.2 F (36.8 C) 97.8 F (36.6 C)  TempSrc: Oral Oral Oral Oral  SpO2: 99% 100% 100% 100%  Weight:      Height:       Supplemental O2: Room Air SpO2: 100 %   Physical Exam:  Constitutional: Elderly anxious woman with low mood, in pain Cardiovascular: RRR, no murmurs, rubs or gallops Pulmonary/Chest: normal work of breathing on room air, lungs clear to auscultation bilaterally Abdominal: Normal bowel sounds heard, TTP throughout abdomen Skin: warm and dry Extremities: upper/lower extremity pulses 2+, no lower extremity edema present  Filed Weights   08/24/22 1519  Weight: 110 kg     Intake/Output Summary (Last 24 hours) at 08/30/2022 1242 Last data filed at 08/30/2022 0900 Gross per 24 hour  Intake 1350 ml  Output 401 ml  Net 949 ml   Net IO Since Admission: 3,366 mL [08/30/22 1242]  Pertinent Labs:    Latest Ref Rng & Units 08/30/2022    3:32 AM 08/27/2022    4:46 AM 08/26/2022    7:45 AM  CBC  WBC 4.0 - 10.5 K/uL 6.7  7.4  6.8   Hemoglobin 12.0 - 15.0 g/dL 10.5  11.7  12.4   Hematocrit 36.0 - 46.0 % 31.4  33.8  36.1   Platelets 150 - 400 K/uL 227  229  300        Latest Ref Rng & Units  08/30/2022    3:32 AM 08/27/2022    2:54 AM 08/26/2022    7:45 AM  CMP  Glucose 70 - 99 mg/dL 61  79  100   BUN 8 - 23 mg/dL 37  34  34   Creatinine 0.44 - 1.00 mg/dL 2.53  2.41  2.34   Sodium 135 - 145 mmol/L 135  134  135   Potassium 3.5 - 5.1 mmol/L 3.4  4.3  4.0   Chloride 98 - 111 mmol/L 102  105  103   CO2 22 - 32 mmol/L '24  21  21   '$ Calcium 8.9 - 10.3 mg/dL 8.0  8.0  7.9    Assessment/Plan:   Principal Problem:   Generalized weakness Active Problems:   Pressure injury of skin   Nausea and vomiting   Diarrhea   Patient Summary: Alicia [name] is a 71 y.o. with a pertinent PMH of HFpEF, hypertension, diabetes mellitus, CKD 4, PAD, prior CVA, Vitamin D Deficiency, who presented with progressive weakness and admitted for decreased functional and postprandial nausea, vomiting, diarrhea.    #Post Prandial Abdominal Pain with N/V #Weakness Yesterday, patient was supposed to  undergo gastric emptying scan however was canceled due to reasons unknown. She was started back on regular diet however refused to eat anything because of fear of abdominal pain exacerbation. She has still not had a bowel movement to this day, and has not had pain relief with .'1mg'$  dilaudid IV. Will have to evaluate opioid use post gastric emptying study. Tramadol??  Plan:  - Gastric emptying study planned for today, patient is currently NPO  - Continue Dextrose 5% in LR  - Patient has not had bowel movements, so canceled Fecal elastase and GI Panel  - Metoclopramide '5mg'$  with meals to help stimulate bowels - Currently on dilaudid .'1mg'$  for pain, will be able to modify pain regimen after study done this afternoon.  #CKDIV #Vitamin D Deficiency  #Anemia of CKD  Most recent Creatinine was 2.53, elevated from 2.41 yesterday. This could be due to lack of PO intake, however patient has been receiving fluids. We will continue to monitor and trend kidney function.    #Chronic HFpEF  #HTN Pt has continued  to be euvolemic on exam, we will continue her amlodipine '10mg'$ , and metoprolol '100mg'$   #Type II DM Sensitive sliding scale. Unsure about patients insulin needs at the moment due to lack of po intake. Blood sugars have varied throughout the night in the setting of D5 in LR, however most recent reading was 70. Will continue to monitor     Code: Full    Dispo: Anticipated discharge in less than three midnights  Drucie Opitz, MD PGY-1 Internal Medicine Resident Pager Number [phone] Please contact the on call pager after 5 pm and on weekends at [phone].

## 2022-08-30 NOTE — Progress Notes (Signed)
Occupational Therapy Treatment Patient Details Name: Alicia [name] MRN: [id number] DOB: 04-Apr-1951 Today's Date: 08/30/2022   History of present illness 71 y.o. female presented to ED 10/4 with FTT. PMhx: CHf, HTN, CVA, T2DM, CKD, vit D deficiency, PAD, depression, HFpEF (Simultaneous filing. User may not have seen previous data.)   OT comments  Patient received in supine and apprehensive about participating with OT/PT. Patient was willing to get to EOB with encouragement and participated in EOB activities including grooming and gown change. Patient stood twice from EOB to side step towards [facility] with patient stating, "please don't let me fall."  Patient would benefit from further OT services to increase performance in ADLs and functional transfers. Acute OT to continue to follow.    Recommendations for follow up therapy are one component of a multi-disciplinary discharge planning process, led by the attending physician.  Recommendations may be updated based on patient status, additional functional criteria and insurance authorization.    Follow Up Recommendations  Skilled nursing-short term rehab (<3 hours/day)    Assistance Recommended at Discharge Frequent or constant Supervision/Assistance  Patient can return home with the following  Help with stairs or ramp for entrance;Assist for transportation;Assistance with cooking/housework;A lot of help with bathing/dressing/bathroom;A lot of help with walking and/or transfers   Equipment Recommendations  None recommended by OT    Recommendations for Other Services      Precautions / Restrictions Precautions Precautions: Fall (Simultaneous filing. User may not have seen previous data.) Restrictions Weight Bearing Restrictions: No (Simultaneous filing. User may not have seen previous data.)       Mobility Bed Mobility Overal bed mobility: Needs Assistance (Simultaneous filing. User may not have seen previous data.) Bed Mobility:  Supine to Sit, Sit to Supine (Simultaneous filing. User may not have seen previous data.)   Sidelying to sit: Min assist (Simultaneous filing. User may not have seen previous data.) Supine to sit: Mod assist (Simultaneous filing. User may not have seen previous data.)     General bed mobility comments: increased time to perform with mod assist to return to supine due to assistance with BLEs (Simultaneous filing. User may not have seen previous data.)    Transfers Overall transfer level: Needs assistance (Simultaneous filing. User may not have seen previous data.) Equipment used: 2 person hand held assist Transfers: Sit to/from Stand (Simultaneous filing. User may not have seen previous data.) Sit to Stand: Mod assist, +2 physical assistance (Simultaneous filing. User may not have seen previous data.)           General transfer comment: mod assist +2 to stand from EOB due to pain with limited standing tolerance.  Patient appeared to fear of falling stating, "Don't let me fall" (Simultaneous filing. User may not have seen previous data.)     Balance Overall balance assessment: Needs assistance (Simultaneous filing. User may not have seen previous data.) Sitting-balance support: No upper extremity supported, Feet supported (Simultaneous filing. User may not have seen previous data.) Sitting balance-Leahy Scale: Fair (Simultaneous filing. User may not have seen previous data.) Sitting balance - Comments: EOB (Simultaneous filing. User may not have seen previous data.)   Standing balance support: Bilateral upper extremity supported, During functional activity (Simultaneous filing. User may not have seen previous data.) Standing balance-Leahy Scale: Poor (Simultaneous filing. User may not have seen previous data.) Standing balance comment: reliant on Therapists for support (Simultaneous filing. User may not have seen previous data.)  ADL either performed  or assessed with clinical judgement   ADL Overall ADL's : Needs assistance/impaired     Grooming: Wash/dry hands;Wash/dry face;Sitting;Min guard Grooming Details (indicate cue type and reason): on EOB         Upper Body Dressing : Minimal assistance;Sitting Upper Body Dressing Details (indicate cue type and reason): to change gown                        Extremity/Trunk Assessment Upper Extremity Assessment RUE Deficits / Details: chronic R shoulder dysfunction RUE Sensation: WNL RUE Coordination: WNL            Vision       Perception     Praxis      Cognition Arousal/Alertness: Awake/alert (Simultaneous filing. User may not have seen previous data.) Behavior During Therapy: [facility] for tasks assessed/performed (Simultaneous filing. User may not have seen previous data.) Overall Cognitive Status: Impaired/Different from baseline (Simultaneous filing. User may not have seen previous data.) Area of Impairment: Following commands, Safety/judgement, Problem solving (Simultaneous filing. User may not have seen previous data.)                       Following Commands: Follows one step commands inconsistently, Follows one step commands with increased time (Simultaneous filing. User may not have seen previous data.) Safety/Judgement: Decreased awareness of safety, Decreased awareness of deficits (Simultaneous filing. User may not have seen previous data.)   Problem Solving: Slow processing, Requires verbal cues (Simultaneous filing. User may not have seen previous data.)          Exercises      Shoulder Instructions       General Comments      Pertinent Vitals/ Pain       Pain Assessment Pain Assessment: Faces (Simultaneous filing. User may not have seen previous data.) Faces Pain Scale: Hurts little more (Simultaneous filing. User may not have seen previous data.) Pain Location: abdomin (Simultaneous filing. User may not have seen previous  data.) Pain Descriptors / Indicators: Discomfort, Grimacing (Simultaneous filing. User may not have seen previous data.) Pain Intervention(s): Monitored during session, Limited activity within patient's tolerance, Repositioned (Simultaneous filing. User may not have seen previous data.)  Home Living                                          Prior Functioning/Environment              Frequency  Min 2X/week        Progress Toward Goals  OT Goals(current goals can now be found in the care plan section)  Progress towards OT goals: Progressing toward goals  Acute Rehab OT Goals Patient Stated Goal: go home OT Goal Formulation: With patient Time For Goal Achievement: 09/08/22 Potential to Achieve Goals: Good ADL Goals Pt Will Perform Grooming: with supervision;standing Pt Will Perform Lower Body Dressing: with supervision;sit to/from stand Pt Will Transfer to Toilet: with supervision;stand pivot transfer;bedside commode Pt/caregiver will Perform Home Exercise Program: Increased strength;Both right and left upper extremity;With theraband;With Supervision  Plan Discharge plan remains appropriate    Co-evaluation    PT/OT/SLP Co-Evaluation/Treatment: Yes Reason for Co-Treatment: For patient/therapist safety;To address functional/ADL transfers PT goals addressed during session: Mobility/safety with mobility OT goals addressed during session: ADL's and self-care      AM-PAC OT "6  Clicks" Daily Activity     Outcome Measure   Help from another person eating meals?: None Help from another person taking care of personal grooming?: None Help from another person toileting, which includes using toliet, bedpan, or urinal?: A Lot Help from another person bathing (including washing, rinsing, drying)?: A Lot Help from another person to put on and taking off regular upper body clothing?: A Little Help from another person to put on and taking off regular lower body  clothing?: A Lot 6 Click Score: 17    End of Session Equipment Utilized During Treatment: Gait belt  OT Visit Diagnosis: Unsteadiness on feet (R26.81);Muscle weakness (generalized) (M62.81);History of falling (Z91.81)   Activity Tolerance Patient tolerated treatment well;Patient limited by pain   Patient Left in bed;with call bell/phone within reach;with bed alarm set   Nurse Communication Mobility status        Time: 6681-5947 OT Time Calculation (min): 27 min  Charges: OT General Charges $OT Visit: 1 Visit OT Treatments $Self Care/Home Management : 8-22 mins  Lodema Hong, [place]  Office [place] 08/30/2022, 2:39 PM

## 2022-08-31 ENCOUNTER — Inpatient Hospital Stay (HOSPITAL_COMMUNITY): Payer: Medicare HMO

## 2022-08-31 DIAGNOSIS — K269 Duodenal ulcer, unspecified as acute or chronic, without hemorrhage or perforation: Secondary | ICD-10-CM | POA: Diagnosis not present

## 2022-08-31 DIAGNOSIS — K2980 Duodenitis without bleeding: Secondary | ICD-10-CM | POA: Diagnosis not present

## 2022-08-31 DIAGNOSIS — R1084 Generalized abdominal pain: Secondary | ICD-10-CM | POA: Diagnosis not present

## 2022-08-31 DIAGNOSIS — R531 Weakness: Secondary | ICD-10-CM | POA: Diagnosis not present

## 2022-08-31 DIAGNOSIS — K5900 Constipation, unspecified: Secondary | ICD-10-CM | POA: Diagnosis not present

## 2022-08-31 DIAGNOSIS — R112 Nausea with vomiting, unspecified: Secondary | ICD-10-CM | POA: Diagnosis not present

## 2022-08-31 LAB — CBC
HCT: 34.2 % — ABNORMAL LOW (ref 36.0–46.0)
Hemoglobin: 11.3 g/dL — ABNORMAL LOW (ref 12.0–15.0)
MCH: 31.8 pg (ref 26.0–34.0)
MCHC: 33 g/dL (ref 30.0–36.0)
MCV: 96.3 fL (ref 80.0–100.0)
Platelets: 264 10*3/uL (ref 150–400)
RBC: 3.55 MIL/uL — ABNORMAL LOW (ref 3.87–5.11)
RDW: 16.2 % — ABNORMAL HIGH (ref 11.5–15.5)
WBC: 6.2 10*3/uL (ref 4.0–10.5)
nRBC: 0 % (ref 0.0–0.2)

## 2022-08-31 LAB — BASIC METABOLIC PANEL
Anion gap: 7 (ref 5–15)
BUN: 36 mg/dL — ABNORMAL HIGH (ref 8–23)
CO2: 22 mmol/L (ref 22–32)
Calcium: 7.7 mg/dL — ABNORMAL LOW (ref 8.9–10.3)
Chloride: 106 mmol/L (ref 98–111)
Creatinine, Ser: 2.42 mg/dL — ABNORMAL HIGH (ref 0.44–1.00)
GFR, Estimated: 21 mL/min — ABNORMAL LOW (ref >60)
Glucose, Bld: 146 mg/dL — ABNORMAL HIGH (ref 70–99)
Potassium: 3.8 mmol/L (ref 3.5–5.1)
Sodium: 135 mmol/L (ref 135–145)

## 2022-08-31 LAB — GLUCOSE, CAPILLARY
Glucose-Capillary: 139 mg/dL — ABNORMAL HIGH (ref 70–99)
Glucose-Capillary: 143 mg/dL — ABNORMAL HIGH (ref 70–99)
Glucose-Capillary: 153 mg/dL — ABNORMAL HIGH (ref 70–99)
Glucose-Capillary: 154 mg/dL — ABNORMAL HIGH (ref 70–99)
Glucose-Capillary: 167 mg/dL — ABNORMAL HIGH (ref 70–99)

## 2022-08-31 LAB — LIPASE, BLOOD: Lipase: 70 U/L — ABNORMAL HIGH (ref 11–51)

## 2022-08-31 LAB — GLIADIN ANTIBODIES, SERUM
Antigliadin Abs, IgA: 7 units (ref 0–19)
Gliadin IgG: 2 units (ref 0–19)

## 2022-08-31 LAB — ENDOMYSIAL IGA ANTIBODY: Endomysial Ab, IgA: NEGATIVE

## 2022-08-31 MED ORDER — DICYCLOMINE HCL 20 MG PO TABS
20.0000 mg | ORAL_TABLET | Freq: Three times a day (TID) | ORAL | Status: DC
  Administered 2022-08-31 – 2022-09-02 (×4): 20 mg via ORAL
  Filled 2022-08-31 (×4): qty 1

## 2022-08-31 MED ORDER — HYDROMORPHONE HCL 1 MG/ML IJ SOLN
0.5000 mg | INTRAMUSCULAR | Status: DC | PRN
  Administered 2022-08-31 (×2): 0.5 mg via INTRAVENOUS
  Filled 2022-08-31 (×2): qty 1

## 2022-08-31 MED ORDER — SORBITOL 70 % SOLN
400.0000 mL | TOPICAL_OIL | Freq: Once | ORAL | Status: DC
  Filled 2022-08-31 (×2): qty 120

## 2022-08-31 MED ORDER — HYDROMORPHONE HCL 1 MG/ML IJ SOLN: 0.5000 mg | INTRAMUSCULAR | Status: DC | PRN

## 2022-08-31 NOTE — Progress Notes (Addendum)
Subjective:   Summary: Alicia [name] is a 71 y.o. year old female currently admitted on the IMTS HD#7 for postprandial abdominal pain with nausea and vomiting.  Overnight Events: NOE   Pt was seen bedside this AM. She is still having abdominal pain throughout her abdomen, and has still not been able to tolerate anything PO. She attempted the prep for the gastric emptying scan yesterday, however could not tolerate the amount of eggs that she needed to eat for the preparation. Overall, she is still in pain and uncomfortable. She has not had a bowel movement as of yet.   Objective:  Vital signs in last 24 hours: Vitals:   08/30/22 2028 08/30/22 2028 08/31/22 0403 08/31/22 0951  BP: (!) 153/83 (!) 153/83 135/72 (!) 143/70  Pulse: 71 71 72 71  Resp: '18 18 18   '$ Temp: 98.4 F (36.9 C) 98.4 F (36.9 C) 98.2 F (36.8 C) (!) 97.5 F (36.4 C)  TempSrc: Oral Oral Oral Oral  SpO2: 98% 98% 98% 100%  Weight:      Height:       Supplemental O2: Room Air SpO2: 100 %   Physical Exam:  Constitutional: Elderly woman with low affect in pain  Cardiovascular: RRR, no murmurs, rubs or gallops Pulmonary/Chest: normal work of breathing on room air, lungs clear to auscultation bilaterally Abdominal: Normal bowel sounds heard, TTP throughout abdomen  Skin: warm and dry Extremities: upper/lower extremity pulses 2+, no lower extremity edema present  Filed Weights   08/24/22 1519  Weight: 110 kg     Intake/Output Summary (Last 24 hours) at 08/31/2022 1058 Last data filed at 08/31/2022 0400 Gross per 24 hour  Intake 1728.3 ml  Output 200 ml  Net 1528.3 ml   Net IO Since Admission: 4,894.3 mL [08/31/22 1058]  Pertinent Labs:    Latest Ref Rng & Units 08/31/2022    1:32 AM 08/30/2022    3:32 AM 08/27/2022    4:46 AM  CBC  WBC 4.0 - 10.5 K/uL 6.2  6.7  7.4   Hemoglobin 12.0 - 15.0 g/dL 11.3  10.5  11.7   Hematocrit 36.0 - 46.0 % 34.2  31.4  33.8   Platelets 150  - 400 K/uL 264  227  229        Latest Ref Rng & Units 08/31/2022    1:32 AM 08/30/2022    3:32 AM 08/27/2022    2:54 AM  CMP  Glucose 70 - 99 mg/dL 146  61  79   BUN 8 - 23 mg/dL 36  37  34   Creatinine 0.44 - 1.00 mg/dL 2.42  2.53  2.41   Sodium 135 - 145 mmol/L 135  135  134   Potassium 3.5 - 5.1 mmol/L 3.8  3.4  4.3   Chloride 98 - 111 mmol/L 106  102  105   CO2 22 - 32 mmol/L '22  24  21   '$ Calcium 8.9 - 10.3 mg/dL 7.7  8.0  8.0     Imaging: DG Abd 1 View  Result Date: 08/31/2022 CLINICAL DATA:  Constipation EXAM: ABDOMEN - 1 VIEW COMPARISON:  CT 08/24/2022 FINDINGS: There is no evidence of bowel obstruction. Moderate rectal stool burden. IMPRESSION: Moderate rectal stool burden.  No evidence of bowel obstruction. Electronically Signed   By: Maurine Simmering M.D.   On: 08/31/2022 10:42     Assessment/Plan:  Principal Problem:   Generalized weakness Active Problems:   Pressure injury of skin   Nausea and vomiting   Diarrhea   Patient Summary: Alicia [name] is a 71 y.o. with a pertinent PMH of HFpEF, hypertension, diabetes mellitus, CKD4, PAD, prior CVA, Vitamin D Deficiency, who presented with progressive weakness and admitted for decreased functional ability and postprandial abdominal pain, nausea, vomiting, and diarrhea (diarrhea resolved; now no bm's x few days with no urgency).    #Post Prandial Abdominal Pain with N/V #Weakness Patient was supposed to go undergo gastric emptying scan yesterday, however during preparation with eggs pt could not tolerate. She states that she isn't used to eating that much food, and is still nervous that everytime she eats she will experience abdominal pain. She is still not having bowerl movements, and only had a couple sips of her soup this morning. Still unsure as the cause of this as we are not able to a gastric emptying scan.   Plan:  - GI consult has been placed, will follow their recommendations  - Increase Dilaudid to .'5mg'$   for pain management  - Continue metoclopramide  - KUB to assess if patient has impacted stool - Will follow up lipase level  #CKDIV #Vitamin D Deficiency  Anemia of CKD  Most recent Creatinine level was 2.42, down from 2.53 yesterday. We will continue to monitor.   #Chronic HFpEF  #HTN  Pt continues to be euvolemic on exam, we will continue her amlodipine '10mg'$  and metoprolol '100mg'$   #Type II DM Continue SSI, will monitor carefully as patient has had poor intake.   Code: Full   Dispo: Anticipated discharge in less than three midnights.  Drucie Opitz, MD PGY-1 Internal Medicine Resident Pager Number [phone] Please contact the on call pager after 5 pm and on weekends at [phone].

## 2022-08-31 NOTE — H&P (View-Only) (Signed)
Consultation Note   Referring Provider: Internal Medicine Teaching Service.  PCP: Rick Duff, MD Primary Gastroenterologist: Thornton Park, MD Reason for consultation: abdominal pain, anorexia  Hospital Day: 8  Assessment / Plan   # 71 yo female with multiple medical co-morbidities. GI history includes H.pylori gastritis (treated in 2020, follow up eradication testing negative), chronic abdominal pain,  chronic nausea,  colon polyps. Admitted with weakness, post-prandial abdominal pain, N/V, loose stools ( now resolved). Cause of symptoms not clear at this point.  Her symptoms are chronic but sounds like they have recently progressed but details are lacking. She tells me symptoms have been going on for months. She gives a history of a recent 50 pound weight loss.Weights in epic at providers visits show weight gain ( 202 pounds 6/29 > 227 pounds on 7/27 >> 242 pounds on 10/4)  Admitting team workup so far has been unrevealing except for mildly elevated lipase of 70 ( < 2xULN) . TSH nl , CBC at baseline. Albumin low but remainder of liver chemistries nl. Stool path panel nor fecal elastase done as diarrhea stopped. Celiac studies are in progress. Mesenteric US done ( will check results but reportedly unremarkable). Non-contrast CTAP unrevealing. Unfortunately she cannot get contrast for imaging due to CKD Would try to collect a fecal elastase if possible.  At risk for SIBO, could consider treating empirically with Xifaxan.  Her pain is so generalized that not sure EGD would be high yield.  Moderate rectal stool burden on KUB today. Has SMOG enema ordered.  Repeat lipase tomorrow Would hold off on inpatient gastric emptying since using narcotics. Continue dose of Reglan TID Thankfully she isn't requiring much in way of narcotics    # History of colon polyps. Small tubular adenoma was removed in August 2020.  Follow-up colonoscopy  recommended 7 years   # See PMH for additional medical problems   HPI   Alicia [name] is a 71 y.o. female with a past medical history significant for HTN, HLD, obesity, PAD, CVA, CKD4, heart failure, headaches, H.pylori gastritis ( treated in 2020,  depression , colon polyps, chronic anemia, chronic pain. See PMH for any additional medical problems.  Patient was evaluated in our office in Oct 2020 for generalized   abdominal pain ( worse in LLQ), epigastric pain. , constipation   Patient admitted 08/24/22 for acute on chronic weakness, no appetite, post-prandial loose stools, upper abdominal pain, intermittent N/V. Labs have been around baseline with Hgb 10-11 range. WBC normal. Lipase mildly elevated at 70, Albumin 2.4 but liver chemistries otherwise normal. Admitting team has been working up her symptoms and plan was to order h.pylori testing, fecal elastase, [place] to evaluate for mesenteric ischemia, +/- gastric emptying study, stool studies.   No acute findings on mesenteric [place] per admitting team notes.  Gastric emptying study not done, she could not tolerate the eggs.  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  KUB today shows a moderate rectal stool burden.   Patient describes her abdominal pain as "all over my stomach". She points to upper and lower abdomen. She is not clear about whether the pain is better or worse with eating  but seems to not be related. She has no appetite. She has frequent nausea but only occasional vomiting. Takes Tylenol at home. No NSAID use except baby asa. . Dilaudid helps pain for short duration. Diarrhea has stopped for now but at home almost everything she ate led to diarrhea within 20 minutes. No blood in stools. She reports a recent 50 pound weight loss.    Previous GI Evaluation     Aug 2020 EGD and colonoscopy  EGD -Z-line irregular. Biopsied. -  Erythematous mucosa in the gastric body and antrum. Biopsied. - Small hiatal hernia. - Multiple gastric polyps, likely fundic gland polyps. Biopsied. - Normal examined duodenum. Biopsied  Colonoscopy  - The perianal and digital rectal examinations were normal. - Multiple small and large-mouthed diverticula were found in the entire colon. The diverticulosis was most dense in the sigmoid and descending colon. There are scattered diverticula in the transverse colon and right colon. - A 1 mm sessile polyp was removed from ascending colon.  --A 3 mm sessile rectal polyp was removed.  --Exam otherwise nl  Surgical [P], duodenal - DUODENAL MUCOSA WITH MILD BRUNNER GLAND HYPERPLASIA. - NO FEATURES OF SPRUE OR GRANULOMAS. 2. Surgical [P], gastric - MILDLY ACTIVE CHRONIC GASTRITIS. - WARTHIN-STARRY STAIN NEGATIVE FOR HELICOBACTER PYLORI. - NO INTESTINAL METAPLASIA, DYSPLASIA OR MALIGNANCY. 3. Surgical [P], gastric polyps (several) - POLYPOID GASTRIC MUCOSA WITH MILDLY ACTIVE CHRONIC COLITIS. - WARTHIN-STARRY STAIN SHOWS RARE BACTERIA SUGGESTIVE OF HELICOBACTER PYLORI. - NO INTESTINAL METAPLASIA, DYSPLASIA OR MALIGNANCY. 4. Surgical [P], esophagus - GASTROESOPHAGEAL MUCOSA WITH MILD REFLUX CHANGES. - NO INTESTINAL METAPLASIA, DYSPLASIA OR MALIGNANCY. 5. Surgical [P], colon, ascending, polyp - TUBULAR ADENOMA. - NO HIGH GRADE DYSPLASIA OR MALIGNANCY. 6. Surgical [P], colon, rectum, polyp - HYPERPLASTIC POLYP(S). - NO ADENOMATOUS CHANGE OR MALIGNANCY.   Recent Labs and Imaging DG Abd 1 View  CT ABDOMEN PELVIS WO CONTRAST  Result Date: 08/24/2022 CLINICAL DATA:  Left abdominal pain EXAM: CT ABDOMEN AND PELVIS WITHOUT CONTRAST TECHNIQUE: Multidetector CT imaging of the abdomen and pelvis was performed following the standard protocol without IV contrast. RADIATION DOSE REDUCTION: This exam was performed according to the departmental dose-optimization program which includes automated exposure  control, adjustment of the mA and/or kV according to patient size and/or use of iterative reconstruction technique. COMPARISON:  05/30/2022 FINDINGS: Lower chest: Mild bibasilar atelectasis. Hepatobiliary: Unenhanced liver is unremarkable. Distended gallbladder. No cholelithiasis. No intrahepatic or extrahepatic duct dilatation. Pancreas: Within normal limits. Spleen: Within normal limits. Adrenals/Urinary Tract: Adrenal glands are within normal limits. Kidneys are within normal limits.  No hydronephrosis. Bladder is underdistended but unremarkable. Stomach/Bowel: Stomach is within normal limits. Suspected duodenal diverticulum adjacent to the pancreatic head (series 3/image 32), poorly visualized. No evidence of bowel obstruction. Normal appendix (series 3/image 9). Scattered left colonic diverticulosis, without evidence of diverticulitis. Vascular/Lymphatic: No evidence of abdominal aortic aneurysm. Atherosclerotic calcifications of the abdominal aorta and branch vessels. No suspicious abdominopelvic lymphadenopathy. Reproductive: Uterus is grossly unremarkable. Suspected prominent nabothian cyst. Bilateral ovaries are within normal limits. Other: No abdominopelvic ascites. Moderate fat containing periumbilical hernia (series 3/image 67) which now also contains fluid (series 3/image 71). Musculoskeletal: Degenerative changes of the visualized thoracolumbar spine. IMPRESSION: Left colonic diverticulosis, without evidence of diverticulitis. No evidence of bowel obstruction. Normal appendix. Moderate periumbilical hernia with fat/fluid. Additional stable ancillary findings as above. Electronically Signed   By: Julian Hy M.D.   On: 08/24/2022 22:02   DG Chest Port 1 View  Result Date: 08/24/2022 CLINICAL DATA:  Shortness of breath EXAM: PORTABLE CHEST 1 VIEW COMPARISON:  06/30/2022 FINDINGS: Transverse diameter of heart is increased. There are no signs of pulmonary edema or new focal infiltrates. There is  no pleural effusion or pneumothorax. Degenerative changes are noted in both shoulders, more severe on the right side. IMPRESSION: There are no signs of pulmonary edema or focal pulmonary consolidation. Electronically Signed   By: Elmer Picker M.D.   On: 08/24/2022 14:37    Labs:  Recent Labs    08/30/22 0332 08/31/22 0132  WBC 6.7 6.2  HGB 10.5* 11.3*  HCT 31.4* 34.2*  PLT 227 264   Recent Labs    08/30/22 0332 08/31/22 0132  NA 135 135  K 3.4* 3.8  CL 102 106  CO2 24 22  GLUCOSE 61* 146*  BUN 37* 36*  CREATININE 2.53* 2.42*  CALCIUM 8.0* 7.7*   No results for input(s): "PROT", "ALBUMIN", "AST", "ALT", "ALKPHOS", "BILITOT", "BILIDIR", "IBILI" in the last 72 hours. No results for input(s): "HEPBSAG", "HCVAB", "HEPAIGM", "HEPBIGM" in the last 72 hours. No results for input(s): "LABPROT", "INR" in the last 72 hours.  Past Medical History:  Diagnosis Date   Allergy    Arthritis    Chronic headaches    Constipation    Depression    Diabetes mellitus    GERD (gastroesophageal reflux disease)    Hyperlipidemia    Hypertension    Obesity    PAD (peripheral artery disease) ([place])    stage 4    Stroke [facility])     Past Surgical History:  Procedure Laterality Date   ABDOMINAL AORTOGRAM W/LOWER EXTREMITY N/A 12/17/2018   Procedure: ABDOMINAL AORTOGRAM W/LOWER EXTREMITY;  Surgeon: Waynetta Sandy, MD;  Location: [place] [facility];  Service: Cardiovascular;  Laterality: N/A;   ARTERY BIOPSY Left 03/17/2022   Procedure: BIOPSY TEMPORAL ARTERY;  Surgeon: Cherre Robins, MD;  Location: [place];  Service: Vascular;  Laterality: Left;   FEMORAL BYPASS  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 Bridgett Larsson   PERIPHERAL VASCULAR ATHERECTOMY Left 12/17/2018   Procedure: PERIPHERAL VASCULAR ATHERECTOMY;  Surgeon: Waynetta Sandy, MD;  Location: [place] [facility];  Service: Cardiovascular;  Laterality: Left;   PERIPHERAL VASCULAR BALLOON  ANGIOPLASTY Left 12/17/2018   Procedure: PERIPHERAL VASCULAR BALLOON ANGIOPLASTY;  Surgeon: Waynetta Sandy, MD;  Location: [place] [facility];  Service: Cardiovascular;  Laterality: Left;    Family History  Problem Relation Age of Onset   Diabetes Father    Heart disease Mother        NOT before age 85-  Bypass   Hypertension Mother    Hypertension Sister    Varicose Veins Brother    Heart disease Brother        Before age 66   Hypertension Daughter    Colon cancer Neg Hx    Esophageal cancer Neg Hx    Rectal cancer Neg Hx    Stomach cancer Neg Hx     Prior to Admission medications   Medication Sig Start Date End Date Taking? Authorizing Provider  acetaminophen (TYLENOL) 500 MG tablet Take 2 tablets (1,000 mg total) by mouth every 6 (six) hours. 05/25/22  Yes Romana Juniper, MD  amLODipine (NORVASC) 10 MG tablet Take 1 tablet (10 mg total) by mouth daily. 05/19/22 05/19/23 Yes Lacinda Axon, MD  aspirin EC 81 MG tablet Take 81 mg by mouth daily. Swallow whole.   Yes [provider]  Cholecalciferol (VITAMIN D3 PO) Take 1 capsule by mouth daily.   Yes [provider]  diclofenac Sodium (VOLTAREN) 1 % GEL Apply 4 g topically 4 (four) times daily. Apply to both knees. 05/25/22  Yes Romana Juniper, MD  furosemide (LASIX) 40 MG tablet Take 1 tablet (40 mg total) by mouth daily. 07/01/22 07/01/23 Yes Katsadouros, Vasilios, MD  metoprolol succinate (TOPROL-XL) 100 MG 24 hr tablet Take 1 tablet (100 mg total) by mouth daily. Take with or immediately following a meal. Patient taking differently: Take 100 mg by mouth every evening. Take with or immediately following a meal. 05/19/22  Yes Amponsah, Charisse March, MD  OXYGEN Inhale 2 L into the lungs daily as needed (oxygen).   Yes [provider]  rosuvastatin (CRESTOR) 10 MG tablet Take 1 tablet (10 mg total) by mouth every evening. 05/19/22  Yes Amponsah, Charisse March, MD  sitaGLIPtin (JANUVIA) 25 MG  tablet Take 1 tablet (25 mg total) by mouth daily. 06/16/22  Yes Masters, Katie, DO  albuterol (VENTOLIN HFA) 108 (90 Base) MCG/ACT inhaler Inhale 2 puffs into the lungs every 6 (six) hours as needed for wheezing or shortness of breath. Patient not taking: Reported on 08/25/2022 12/10/21   Rick Duff, MD  famotidine (PEPCID) 20 MG tablet Take 20 mg by mouth daily. Patient not taking: Reported on 08/25/2022 05/14/22   [provider]    Current Facility-Administered Medications  Medication Dose Route Frequency Provider Last Rate Last Admin   acetaminophen (TYLENOL) tablet 650 mg  650 mg Oral Q6H Rick Duff, MD   650 mg at 08/31/22 0631   Or   acetaminophen (TYLENOL) suppository 650 mg  650 mg Rectal Q6H Rick Duff, MD       amLODipine (NORVASC) tablet 10 mg  10 mg Oral Daily Lacinda Axon, MD   10 mg at 08/31/22 0846   aspirin EC tablet 81 mg  81 mg Oral Daily Velna Ochs, MD   81 mg at 08/31/22 0846   diclofenac Sodium (VOLTAREN) 1 % topical gel 4 g  4 g Topical QID Romana Juniper, MD   4 g at 08/27/22 2034   enoxaparin (LOVENOX) injection 40 mg  40 mg Subcutaneous Q24H Velna Ochs, MD   40 mg at 08/30/22 2024   feeding supplement (ENSURE ENLIVE / ENSURE PLUS) liquid 237 mL  237 mL Oral BID BM Lacinda Axon, MD   237 mL at 08/31/22 0851   HYDROmorphone (DILAUDID) injection 0.5 mg  0.5 mg Intravenous Q4H PRN Nooruddin, Saad, MD       insulin aspart (novoLOG) injection 0-6 Units  0-6 Units Subcutaneous Q4H Rick Duff, MD   1 Units at 08/31/22 0845   metoCLOPramide (REGLAN) tablet 5 mg  5 mg Oral TID Lanice Shirts, DO   5 mg at 08/31/22 0845   metoprolol succinate (TOPROL-XL) 24 hr tablet 100 mg  100 mg Oral Daily Lacinda Axon, MD   100 mg at 08/31/22 0846   multivitamin (RENA-VIT) tablet 1 tablet  1 tablet Oral QHS Velna Ochs, MD   1 tablet at 08/30/22 2024   pantoprazole (PROTONIX) EC tablet 40 mg  40 mg Oral Daily  Lacinda Axon, MD   40 mg at 08/31/22 0845   rosuvastatin (CRESTOR) tablet 10 mg  10 mg Oral QPM Lacinda Axon, MD   10 mg at 08/30/22 1810   sorbitol, milk of mag, mineral oil, glycerin (SMOG) enema  400 mL Rectal Once Rick Duff, MD  Allergies as of 08/24/2022 - Review Complete 08/24/2022  Allergen Reaction Noted   Nifedipine er Other (See Comments) 04/03/2018   Atorvastatin Swelling 04/22/2019   Latex Rash 04/03/2018    Social History   Socioeconomic History   Marital status: Single    Spouse name: Not on file   Number of children: Not on file   Years of education: Not on file   Highest education level: Not on file  Occupational History   Not on file  Tobacco Use   Smoking status: Light Smoker    Packs/day: 0.50    Years: 35.00    Total pack years: 17.50    Types: Cigarettes   Smokeless tobacco: Never   Tobacco comments:    patient is trying to quit and has gone down to half a pack per day 1 per day.  Vaping Use   Vaping Use: Never used  Substance and Sexual Activity   Alcohol use: Yes    Alcohol/week: 1.0 standard drink of alcohol    Types: 1 Standard drinks or equivalent per week    Comment: occasional use only   Drug use: No   Sexual activity: Not Currently  Other Topics Concern   Not on file  Social History Narrative   Not on file   Social Determinants of Health   Financial Resource Strain: Not on file  Food Insecurity: No Food Insecurity (07/28/2021)   Hunger Vital Sign    Worried About Running Out of Food in the Last Year: Never true    Ran Out of Food in the Last Year: Never true  Transportation Needs: Unmet Transportation Needs (07/28/2021)   PRAPARE - Transportation    Lack of Transportation (Medical): Yes    Lack of Transportation (Non-Medical): Yes  Physical Activity: Inactive (07/28/2021)   Exercise Vital Sign    Days of Exercise per Week: 0 days    Minutes of Exercise per Session: 0 min  Stress: Not on file  Social  Connections: Not on file  Intimate Partner Violence: Not on file    Review of Systems: All systems reviewed and negative except where noted in HPI.  Physical Exam: Vital signs in last 24 hours: Temp:  [97.5 F (36.4 C)-98.4 F (36.9 C)] 97.5 F (36.4 C) (10/11 0951) Pulse Rate:  [71-77] 71 (10/11 0951) Resp:  [18] 18 (10/11 0403) BP: (135-153)/(70-84) 143/70 (10/11 0951) SpO2:  [98 %-100 %] 100 % (10/11 0951) Last BM Date :  (PTA)  General:  Alert female in NAD Psych:  cooperative. Normal mood and affect Eyes: Pupils equal, no icterus. Conjunctive pink Ears:  Normal auditory acuity Nose: No deformity, discharge or lesions Neck:  Supple, no masses felt Lungs:  Clear to auscultation.  Heart:  Regular rate, regular rhythm. No lower extremity edema Abdomen:  Soft, nondistended, generalized tenderness, active bowel sounds, RLQ abdominal hernia.  Rectal :  Deferred Msk: Symmetrical without gross deformities.  Neurologic:  Alert, oriented, grossly normal neurologically Skin:  Intact without significant lesions.    Intake/Output from previous day: 10/10 0701 - 10/11 0700 In: 1728.3 [P.O.:120; I.V.:1608.3] Out: 376 [Urine:375; Stool:1] Intake/Output this shift:  No intake/output data recorded.    Principal Problem:   Generalized weakness Active Problems:   Pressure injury of skin   Nausea and vomiting   Diarrhea    Tye Savoy, NP-C @  08/31/2022, 12:12 PM

## 2022-08-31 NOTE — Consult Note (Signed)
   [facility] CM Inpatient Consult   08/31/2022  Alicia [name] [birth year] [id number]  [place] [facility] [[facility]] Patient: [name]   Primary Care Provider:  Rick Duff, MD, [facility] Internal Medicine which has access to a care coordination team for post hospital transitional needs  Patient screened for readmission prevention hospitalization with noted observation then inpatient status 08/30/22, extreme high risk score for unplanned readmission risk.  Review of patient's medical record reveals patient ihad been referred to [facility] team recently with noted difficulty contacting patient.    Came by the room at this time and patient is fast asleep with NAD noted. Patient awaken to her name. Explained reason for follow up on rounding.  Explained difficulty for staff to leave voicemail message to her phone. Patient states she doesn't have voicemail but a voicemail message could be left with her daughter which is on her DPR [Designated Party Release] documentation.  She states she was resting from pain medicine given earlier but now "it's wearing off, I will call my nurse."  Medical record reveal patient is being recommended for a skilled nursing facility level of care for transitional needs. She hasn't decided about if she wants to go to rehab. Reviewed inpatient TOC team notes, progress notes, PT recommendations.  Plan:  Continue to follow progress and disposition to assess for post hospital care management needs.    For questions contact:   Natividad Brood, RN BSN [place]  [phone] business mobile phone Toll free office [phone]  *[place]  [phone] Fax number: [phone] [email or web address]

## 2022-08-31 NOTE — Plan of Care (Signed)
Pt alert and oriented x 4. Pt has received 2 doses of dilaudid this shift. Continues to complain of abdominal pain. Tolerted water during overnight. On clear liquid diet. Med compliant  Problem: Education: Goal: Ability to describe self-care measures that may prevent or decrease complications (Diabetes Survival Skills Education) will improve Outcome: Progressing Goal: Individualized Educational Video(s) Outcome: Progressing   Problem: Coping: Goal: Ability to adjust to condition or change in health will improve Outcome: Progressing   Problem: Fluid Volume: Goal: Ability to maintain a balanced intake and output will improve Outcome: Progressing   Problem: Health Behavior/Discharge Planning: Goal: Ability to identify and utilize available resources and services will improve Outcome: Progressing Goal: Ability to manage health-related needs will improve Outcome: Progressing   Problem: Metabolic: Goal: Ability to maintain appropriate glucose levels will improve Outcome: Progressing   Problem: Nutritional: Goal: Maintenance of adequate nutrition will improve Outcome: Progressing Goal: Progress toward achieving an optimal weight will improve Outcome: Progressing   Problem: Skin Integrity: Goal: Risk for impaired skin integrity will decrease Outcome: Progressing   Problem: Tissue Perfusion: Goal: Adequacy of tissue perfusion will improve Outcome: Progressing   Problem: Education: Goal: Knowledge of General Education information will improve Description: Including pain rating scale, medication(s)/side effects and non-pharmacologic comfort measures Outcome: Progressing   Problem: Health Behavior/Discharge Planning: Goal: Ability to manage health-related needs will improve Outcome: Progressing   Problem: Clinical Measurements: Goal: Ability to maintain clinical measurements within normal limits will improve Outcome: Progressing Goal: Will remain free from infection Outcome:  Progressing Goal: Diagnostic test results will improve Outcome: Progressing Goal: Respiratory complications will improve Outcome: Progressing Goal: Cardiovascular complication will be avoided Outcome: Progressing   Problem: Activity: Goal: Risk for activity intolerance will decrease Outcome: Progressing   Problem: Nutrition: Goal: Adequate nutrition will be maintained Outcome: Progressing   Problem: Coping: Goal: Level of anxiety will decrease Outcome: Progressing   Problem: Elimination: Goal: Will not experience complications related to bowel motility Outcome: Progressing Goal: Will not experience complications related to urinary retention Outcome: Progressing   Problem: Pain Managment: Goal: General experience of comfort will improve Outcome: Progressing   Problem: Safety: Goal: Ability to remain free from injury will improve Outcome: Progressing   Problem: Skin Integrity: Goal: Risk for impaired skin integrity will decrease Outcome: Progressing

## 2022-08-31 NOTE — Consult Note (Signed)
Consultation Note   Referring Provider: Internal Medicine Teaching Service.  PCP: Rick Duff, MD Primary Gastroenterologist: Thornton Park, MD Reason for consultation: abdominal pain, anorexia  Hospital Day: 8  Assessment / Plan   # 71 yo female with multiple medical co-morbidities. GI history includes H.pylori gastritis (treated in 2020, follow up eradication testing negative), chronic abdominal pain,  chronic nausea,  colon polyps. Admitted with weakness, post-prandial abdominal pain, N/V, loose stools ( now resolved). Cause of symptoms not clear at this point.  Her symptoms are chronic but sounds like they have recently progressed but details are lacking. She tells me symptoms have been going on for months. She gives a history of a recent 50 pound weight loss.Weights in epic at providers visits show weight gain ( 202 pounds 6/29 > 227 pounds on 7/27 >> 242 pounds on 10/4)  Admitting team workup so far has been unrevealing except for mildly elevated lipase of 70 ( < 2xULN) . TSH nl , CBC at baseline. Albumin low but remainder of liver chemistries nl. Stool path panel nor fecal elastase done as diarrhea stopped. Celiac studies are in progress. Mesenteric US done ( will check results but reportedly unremarkable). Non-contrast CTAP unrevealing. Unfortunately she cannot get contrast for imaging due to CKD Would try to collect a fecal elastase if possible.  At risk for SIBO, could consider treating empirically with Xifaxan.  Her pain is so generalized that not sure EGD would be high yield.  Moderate rectal stool burden on KUB today. Has SMOG enema ordered.  Repeat lipase tomorrow Would hold off on inpatient gastric emptying since using narcotics. Continue dose of Reglan TID Thankfully she isn't requiring much in way of narcotics    # History of colon polyps. Small tubular adenoma was removed in August 2020.  Follow-up colonoscopy  recommended 7 years   # See PMH for additional medical problems   HPI   Alicia [name] is a 71 y.o. female with a past medical history significant for HTN, HLD, obesity, PAD, CVA, CKD4, heart failure, headaches, H.pylori gastritis ( treated in 2020,  depression , colon polyps, chronic anemia, chronic pain. See PMH for any additional medical problems.  Patient was evaluated in our office in Oct 2020 for generalized   abdominal pain ( worse in LLQ), epigastric pain. , constipation   Patient admitted 08/24/22 for acute on chronic weakness, no appetite, post-prandial loose stools, upper abdominal pain, intermittent N/V. Labs have been around baseline with Hgb 10-11 range. WBC normal. Lipase mildly elevated at 70, Albumin 2.4 but liver chemistries otherwise normal. Admitting team has been working up her symptoms and plan was to order h.pylori testing, fecal elastase, [place] to evaluate for mesenteric ischemia, +/- gastric emptying study, stool studies.   No acute findings on mesenteric [place] per admitting team notes.  Gastric emptying study not done, she could not tolerate the eggs.  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  KUB today shows a moderate rectal stool burden.   Patient describes her abdominal pain as "all over my stomach". She points to upper and lower abdomen. She is not clear about whether the pain is better or worse with eating  but seems to not be related. She has no appetite. She has frequent nausea but only occasional vomiting. Takes Tylenol at home. No NSAID use except baby asa. . Dilaudid helps pain for short duration. Diarrhea has stopped for now but at home almost everything she ate led to diarrhea within 20 minutes. No blood in stools. She reports a recent 50 pound weight loss.    Previous GI Evaluation     Aug 2020 EGD and colonoscopy  EGD -Z-line irregular. Biopsied. -  Erythematous mucosa in the gastric body and antrum. Biopsied. - Small hiatal hernia. - Multiple gastric polyps, likely fundic gland polyps. Biopsied. - Normal examined duodenum. Biopsied  Colonoscopy  - The perianal and digital rectal examinations were normal. - Multiple small and large-mouthed diverticula were found in the entire colon. The diverticulosis was most dense in the sigmoid and descending colon. There are scattered diverticula in the transverse colon and right colon. - A 1 mm sessile polyp was removed from ascending colon.  --A 3 mm sessile rectal polyp was removed.  --Exam otherwise nl  Surgical [P], duodenal - DUODENAL MUCOSA WITH MILD BRUNNER GLAND HYPERPLASIA. - NO FEATURES OF SPRUE OR GRANULOMAS. 2. Surgical [P], gastric - MILDLY ACTIVE CHRONIC GASTRITIS. - WARTHIN-STARRY STAIN NEGATIVE FOR HELICOBACTER PYLORI. - NO INTESTINAL METAPLASIA, DYSPLASIA OR MALIGNANCY. 3. Surgical [P], gastric polyps (several) - POLYPOID GASTRIC MUCOSA WITH MILDLY ACTIVE CHRONIC COLITIS. - WARTHIN-STARRY STAIN SHOWS RARE BACTERIA SUGGESTIVE OF HELICOBACTER PYLORI. - NO INTESTINAL METAPLASIA, DYSPLASIA OR MALIGNANCY. 4. Surgical [P], esophagus - GASTROESOPHAGEAL MUCOSA WITH MILD REFLUX CHANGES. - NO INTESTINAL METAPLASIA, DYSPLASIA OR MALIGNANCY. 5. Surgical [P], colon, ascending, polyp - TUBULAR ADENOMA. - NO HIGH GRADE DYSPLASIA OR MALIGNANCY. 6. Surgical [P], colon, rectum, polyp - HYPERPLASTIC POLYP(S). - NO ADENOMATOUS CHANGE OR MALIGNANCY.   Recent Labs and Imaging DG Abd 1 View  CT ABDOMEN PELVIS WO CONTRAST  Result Date: 08/24/2022 CLINICAL DATA:  Left abdominal pain EXAM: CT ABDOMEN AND PELVIS WITHOUT CONTRAST TECHNIQUE: Multidetector CT imaging of the abdomen and pelvis was performed following the standard protocol without IV contrast. RADIATION DOSE REDUCTION: This exam was performed according to the departmental dose-optimization program which includes automated exposure  control, adjustment of the mA and/or kV according to patient size and/or use of iterative reconstruction technique. COMPARISON:  05/30/2022 FINDINGS: Lower chest: Mild bibasilar atelectasis. Hepatobiliary: Unenhanced liver is unremarkable. Distended gallbladder. No cholelithiasis. No intrahepatic or extrahepatic duct dilatation. Pancreas: Within normal limits. Spleen: Within normal limits. Adrenals/Urinary Tract: Adrenal glands are within normal limits. Kidneys are within normal limits.  No hydronephrosis. Bladder is underdistended but unremarkable. Stomach/Bowel: Stomach is within normal limits. Suspected duodenal diverticulum adjacent to the pancreatic head (series 3/image 32), poorly visualized. No evidence of bowel obstruction. Normal appendix (series 3/image 9). Scattered left colonic diverticulosis, without evidence of diverticulitis. Vascular/Lymphatic: No evidence of abdominal aortic aneurysm. Atherosclerotic calcifications of the abdominal aorta and branch vessels. No suspicious abdominopelvic lymphadenopathy. Reproductive: Uterus is grossly unremarkable. Suspected prominent nabothian cyst. Bilateral ovaries are within normal limits. Other: No abdominopelvic ascites. Moderate fat containing periumbilical hernia (series 3/image 67) which now also contains fluid (series 3/image 71). Musculoskeletal: Degenerative changes of the visualized thoracolumbar spine. IMPRESSION: Left colonic diverticulosis, without evidence of diverticulitis. No evidence of bowel obstruction. Normal appendix. Moderate periumbilical hernia with fat/fluid. Additional stable ancillary findings as above. Electronically Signed   By: Julian Hy M.D.   On: 08/24/2022 22:02   DG Chest Port 1 View  Result Date: 08/24/2022 CLINICAL DATA:  Shortness of breath EXAM: PORTABLE CHEST 1 VIEW COMPARISON:  06/30/2022 FINDINGS: Transverse diameter of heart is increased. There are no signs of pulmonary edema or new focal infiltrates. There is  no pleural effusion or pneumothorax. Degenerative changes are noted in both shoulders, more severe on the right side. IMPRESSION: There are no signs of pulmonary edema or focal pulmonary consolidation. Electronically Signed   By: Elmer Picker M.D.   On: 08/24/2022 14:37    Labs:  Recent Labs    08/30/22 0332 08/31/22 0132  WBC 6.7 6.2  HGB 10.5* 11.3*  HCT 31.4* 34.2*  PLT 227 264   Recent Labs    08/30/22 0332 08/31/22 0132  NA 135 135  K 3.4* 3.8  CL 102 106  CO2 24 22  GLUCOSE 61* 146*  BUN 37* 36*  CREATININE 2.53* 2.42*  CALCIUM 8.0* 7.7*   No results for input(s): "PROT", "ALBUMIN", "AST", "ALT", "ALKPHOS", "BILITOT", "BILIDIR", "IBILI" in the last 72 hours. No results for input(s): "HEPBSAG", "HCVAB", "HEPAIGM", "HEPBIGM" in the last 72 hours. No results for input(s): "LABPROT", "INR" in the last 72 hours.  Past Medical History:  Diagnosis Date   Allergy    Arthritis    Chronic headaches    Constipation    Depression    Diabetes mellitus    GERD (gastroesophageal reflux disease)    Hyperlipidemia    Hypertension    Obesity    PAD (peripheral artery disease) ([place])    stage 4    Stroke [facility])     Past Surgical History:  Procedure Laterality Date   ABDOMINAL AORTOGRAM W/LOWER EXTREMITY N/A 12/17/2018   Procedure: ABDOMINAL AORTOGRAM W/LOWER EXTREMITY;  Surgeon: Waynetta Sandy, MD;  Location: [place] [facility];  Service: Cardiovascular;  Laterality: N/A;   ARTERY BIOPSY Left 03/17/2022   Procedure: BIOPSY TEMPORAL ARTERY;  Surgeon: Cherre Robins, MD;  Location: [place];  Service: Vascular;  Laterality: Left;   FEMORAL BYPASS  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 Bridgett Larsson   PERIPHERAL VASCULAR ATHERECTOMY Left 12/17/2018   Procedure: PERIPHERAL VASCULAR ATHERECTOMY;  Surgeon: Waynetta Sandy, MD;  Location: [place] [facility];  Service: Cardiovascular;  Laterality: Left;   PERIPHERAL VASCULAR BALLOON  ANGIOPLASTY Left 12/17/2018   Procedure: PERIPHERAL VASCULAR BALLOON ANGIOPLASTY;  Surgeon: Waynetta Sandy, MD;  Location: [place] [facility];  Service: Cardiovascular;  Laterality: Left;    Family History  Problem Relation Age of Onset   Diabetes Father    Heart disease Mother        NOT before age 60-  Bypass   Hypertension Mother    Hypertension Sister    Varicose Veins Brother    Heart disease Brother        Before age 76   Hypertension Daughter    Colon cancer Neg Hx    Esophageal cancer Neg Hx    Rectal cancer Neg Hx    Stomach cancer Neg Hx     Prior to Admission medications   Medication Sig Start Date End Date Taking? Authorizing Provider  acetaminophen (TYLENOL) 500 MG tablet Take 2 tablets (1,000 mg total) by mouth every 6 (six) hours. 05/25/22  Yes Romana Juniper, MD  amLODipine (NORVASC) 10 MG tablet Take 1 tablet (10 mg total) by mouth daily. 05/19/22 05/19/23 Yes Lacinda Axon, MD  aspirin EC 81 MG tablet Take 81 mg by mouth daily. Swallow whole.   Yes [provider]  Cholecalciferol (VITAMIN D3 PO) Take 1 capsule by mouth daily.   Yes [provider]  diclofenac Sodium (VOLTAREN) 1 % GEL Apply 4 g topically 4 (four) times daily. Apply to both knees. 05/25/22  Yes Romana Juniper, MD  furosemide (LASIX) 40 MG tablet Take 1 tablet (40 mg total) by mouth daily. 07/01/22 07/01/23 Yes Katsadouros, Vasilios, MD  metoprolol succinate (TOPROL-XL) 100 MG 24 hr tablet Take 1 tablet (100 mg total) by mouth daily. Take with or immediately following a meal. Patient taking differently: Take 100 mg by mouth every evening. Take with or immediately following a meal. 05/19/22  Yes Amponsah, Charisse March, MD  OXYGEN Inhale 2 L into the lungs daily as needed (oxygen).   Yes [provider]  rosuvastatin (CRESTOR) 10 MG tablet Take 1 tablet (10 mg total) by mouth every evening. 05/19/22  Yes Amponsah, Charisse March, MD  sitaGLIPtin (JANUVIA) 25 MG  tablet Take 1 tablet (25 mg total) by mouth daily. 06/16/22  Yes Masters, Katie, DO  albuterol (VENTOLIN HFA) 108 (90 Base) MCG/ACT inhaler Inhale 2 puffs into the lungs every 6 (six) hours as needed for wheezing or shortness of breath. Patient not taking: Reported on 08/25/2022 12/10/21   Rick Duff, MD  famotidine (PEPCID) 20 MG tablet Take 20 mg by mouth daily. Patient not taking: Reported on 08/25/2022 05/14/22   [provider]    Current Facility-Administered Medications  Medication Dose Route Frequency Provider Last Rate Last Admin   acetaminophen (TYLENOL) tablet 650 mg  650 mg Oral Q6H Rick Duff, MD   650 mg at 08/31/22 0631   Or   acetaminophen (TYLENOL) suppository 650 mg  650 mg Rectal Q6H Rick Duff, MD       amLODipine (NORVASC) tablet 10 mg  10 mg Oral Daily Lacinda Axon, MD   10 mg at 08/31/22 0846   aspirin EC tablet 81 mg  81 mg Oral Daily Velna Ochs, MD   81 mg at 08/31/22 0846   diclofenac Sodium (VOLTAREN) 1 % topical gel 4 g  4 g Topical QID Romana Juniper, MD   4 g at 08/27/22 2034   enoxaparin (LOVENOX) injection 40 mg  40 mg Subcutaneous Q24H Velna Ochs, MD   40 mg at 08/30/22 2024   feeding supplement (ENSURE ENLIVE / ENSURE PLUS) liquid 237 mL  237 mL Oral BID BM Lacinda Axon, MD   237 mL at 08/31/22 0851   HYDROmorphone (DILAUDID) injection 0.5 mg  0.5 mg Intravenous Q4H PRN Nooruddin, Saad, MD       insulin aspart (novoLOG) injection 0-6 Units  0-6 Units Subcutaneous Q4H Rick Duff, MD   1 Units at 08/31/22 0845   metoCLOPramide (REGLAN) tablet 5 mg  5 mg Oral TID Lanice Shirts, DO   5 mg at 08/31/22 0845   metoprolol succinate (TOPROL-XL) 24 hr tablet 100 mg  100 mg Oral Daily Lacinda Axon, MD   100 mg at 08/31/22 0846   multivitamin (RENA-VIT) tablet 1 tablet  1 tablet Oral QHS Velna Ochs, MD   1 tablet at 08/30/22 2024   pantoprazole (PROTONIX) EC tablet 40 mg  40 mg Oral Daily  Lacinda Axon, MD   40 mg at 08/31/22 0845   rosuvastatin (CRESTOR) tablet 10 mg  10 mg Oral QPM Lacinda Axon, MD   10 mg at 08/30/22 1810   sorbitol, milk of mag, mineral oil, glycerin (SMOG) enema  400 mL Rectal Once Rick Duff, MD  Allergies as of 08/24/2022 - Review Complete 08/24/2022  Allergen Reaction Noted   Nifedipine er Other (See Comments) 04/03/2018   Atorvastatin Swelling 04/22/2019   Latex Rash 04/03/2018    Social History   Socioeconomic History   Marital status: Single    Spouse name: Not on file   Number of children: Not on file   Years of education: Not on file   Highest education level: Not on file  Occupational History   Not on file  Tobacco Use   Smoking status: Light Smoker    Packs/day: 0.50    Years: 35.00    Total pack years: 17.50    Types: Cigarettes   Smokeless tobacco: Never   Tobacco comments:    patient is trying to quit and has gone down to half a pack per day 1 per day.  Vaping Use   Vaping Use: Never used  Substance and Sexual Activity   Alcohol use: Yes    Alcohol/week: 1.0 standard drink of alcohol    Types: 1 Standard drinks or equivalent per week    Comment: occasional use only   Drug use: No   Sexual activity: Not Currently  Other Topics Concern   Not on file  Social History Narrative   Not on file   Social Determinants of Health   Financial Resource Strain: Not on file  Food Insecurity: No Food Insecurity (07/28/2021)   Hunger Vital Sign    Worried About Running Out of Food in the Last Year: Never true    Ran Out of Food in the Last Year: Never true  Transportation Needs: Unmet Transportation Needs (07/28/2021)   PRAPARE - Transportation    Lack of Transportation (Medical): Yes    Lack of Transportation (Non-Medical): Yes  Physical Activity: Inactive (07/28/2021)   Exercise Vital Sign    Days of Exercise per Week: 0 days    Minutes of Exercise per Session: 0 min  Stress: Not on file  Social  Connections: Not on file  Intimate Partner Violence: Not on file    Review of Systems: All systems reviewed and negative except where noted in HPI.  Physical Exam: Vital signs in last 24 hours: Temp:  [97.5 F (36.4 C)-98.4 F (36.9 C)] 97.5 F (36.4 C) (10/11 0951) Pulse Rate:  [71-77] 71 (10/11 0951) Resp:  [18] 18 (10/11 0403) BP: (135-153)/(70-84) 143/70 (10/11 0951) SpO2:  [98 %-100 %] 100 % (10/11 0951) Last BM Date :  (PTA)  General:  Alert female in NAD Psych:  cooperative. Normal mood and affect Eyes: Pupils equal, no icterus. Conjunctive pink Ears:  Normal auditory acuity Nose: No deformity, discharge or lesions Neck:  Supple, no masses felt Lungs:  Clear to auscultation.  Heart:  Regular rate, regular rhythm. No lower extremity edema Abdomen:  Soft, nondistended, generalized tenderness, active bowel sounds, RLQ abdominal hernia.  Rectal :  Deferred Msk: Symmetrical without gross deformities.  Neurologic:  Alert, oriented, grossly normal neurologically Skin:  Intact without significant lesions.    Intake/Output from previous day: 10/10 0701 - 10/11 0700 In: 1728.3 [P.O.:120; I.V.:1608.3] Out: 376 [Urine:375; Stool:1] Intake/Output this shift:  No intake/output data recorded.    Principal Problem:   Generalized weakness Active Problems:   Pressure injury of skin   Nausea and vomiting   Diarrhea    Tye Savoy, NP-C @  08/31/2022, 12:12 PM

## 2022-09-01 ENCOUNTER — Encounter (HOSPITAL_COMMUNITY)
Admission: EM | Disposition: A | Discharge status: Hospice | Payer: Self-pay | Source: Home / Self Care | Attending: Internal Medicine

## 2022-09-01 ENCOUNTER — Inpatient Hospital Stay (HOSPITAL_COMMUNITY): Payer: Medicare HMO | Admitting: Anesthesiology

## 2022-09-01 ENCOUNTER — Encounter (HOSPITAL_COMMUNITY): Payer: Self-pay | Admitting: Internal Medicine

## 2022-09-01 DIAGNOSIS — K2289 Other specified disease of esophagus: Secondary | ICD-10-CM | POA: Diagnosis not present

## 2022-09-01 DIAGNOSIS — K269 Duodenal ulcer, unspecified as acute or chronic, without hemorrhage or perforation: Secondary | ICD-10-CM

## 2022-09-01 DIAGNOSIS — K319 Disease of stomach and duodenum, unspecified: Secondary | ICD-10-CM | POA: Diagnosis not present

## 2022-09-01 DIAGNOSIS — K2980 Duodenitis without bleeding: Secondary | ICD-10-CM

## 2022-09-01 DIAGNOSIS — K449 Diaphragmatic hernia without obstruction or gangrene: Secondary | ICD-10-CM | POA: Diagnosis not present

## 2022-09-01 DIAGNOSIS — I110 Hypertensive heart disease with heart failure: Secondary | ICD-10-CM

## 2022-09-01 DIAGNOSIS — R112 Nausea with vomiting, unspecified: Secondary | ICD-10-CM | POA: Diagnosis not present

## 2022-09-01 DIAGNOSIS — K3189 Other diseases of stomach and duodenum: Secondary | ICD-10-CM | POA: Diagnosis not present

## 2022-09-01 DIAGNOSIS — F17210 Nicotine dependence, cigarettes, uncomplicated: Secondary | ICD-10-CM | POA: Diagnosis not present

## 2022-09-01 DIAGNOSIS — I509 Heart failure, unspecified: Secondary | ICD-10-CM | POA: Diagnosis not present

## 2022-09-01 DIAGNOSIS — R531 Weakness: Secondary | ICD-10-CM | POA: Diagnosis not present

## 2022-09-01 DIAGNOSIS — R1084 Generalized abdominal pain: Secondary | ICD-10-CM | POA: Diagnosis not present

## 2022-09-01 HISTORY — PX: ESOPHAGOGASTRODUODENOSCOPY (EGD) WITH PROPOFOL: SHX5813

## 2022-09-01 HISTORY — PX: BIOPSY: SHX5522

## 2022-09-01 LAB — GLUCOSE, CAPILLARY
Glucose-Capillary: 104 mg/dL — ABNORMAL HIGH (ref 70–99)
Glucose-Capillary: 115 mg/dL — ABNORMAL HIGH (ref 70–99)
Glucose-Capillary: 118 mg/dL — ABNORMAL HIGH (ref 70–99)
Glucose-Capillary: 118 mg/dL — ABNORMAL HIGH (ref 70–99)
Glucose-Capillary: 129 mg/dL — ABNORMAL HIGH (ref 70–99)
Glucose-Capillary: 89 mg/dL (ref 70–99)
Glucose-Capillary: 99 mg/dL (ref 70–99)

## 2022-09-01 LAB — BASIC METABOLIC PANEL
Anion gap: 11 (ref 5–15)
BUN: 35 mg/dL — ABNORMAL HIGH (ref 8–23)
CO2: 20 mmol/L — ABNORMAL LOW (ref 22–32)
Calcium: 8.2 mg/dL — ABNORMAL LOW (ref 8.9–10.3)
Chloride: 105 mmol/L (ref 98–111)
Creatinine, Ser: 2.49 mg/dL — ABNORMAL HIGH (ref 0.44–1.00)
GFR, Estimated: 20 mL/min — ABNORMAL LOW (ref >60)
Glucose, Bld: 116 mg/dL — ABNORMAL HIGH (ref 70–99)
Potassium: 4.2 mmol/L (ref 3.5–5.1)
Sodium: 136 mmol/L (ref 135–145)

## 2022-09-01 LAB — CORTISOL-AM, BLOOD: Cortisol - AM: 46.2 ug/dL — ABNORMAL HIGH (ref 6.7–22.6)

## 2022-09-01 LAB — LIPASE, BLOOD: Lipase: 59 U/L — ABNORMAL HIGH (ref 11–51)

## 2022-09-01 LAB — TISSUE TRANSGLUTAMINASE, IGG: Tissue Transglut Ab: <2 U/mL (ref 0–5)

## 2022-09-01 SURGERY — ESOPHAGOGASTRODUODENOSCOPY (EGD) WITH PROPOFOL
Anesthesia: Monitor Anesthesia Care

## 2022-09-01 MED ORDER — PANTOPRAZOLE SODIUM 40 MG IV SOLR
40.0000 mg | Freq: Two times a day (BID) | INTRAVENOUS | Status: DC
  Administered 2022-09-01: 40 mg via INTRAVENOUS
  Filled 2022-09-01: qty 10

## 2022-09-01 MED ORDER — PHENYLEPHRINE 80 MCG/ML (10ML) SYRINGE FOR IV PUSH (FOR BLOOD PRESSURE SUPPORT)
PREFILLED_SYRINGE | INTRAVENOUS | Status: DC | PRN
  Administered 2022-09-01: 160 ug via INTRAVENOUS
  Administered 2022-09-01: 80 ug via INTRAVENOUS

## 2022-09-01 MED ORDER — PROPOFOL 500 MG/50ML IV EMUL
INTRAVENOUS | Status: DC | PRN
  Administered 2022-09-01: 150 ug/kg/min via INTRAVENOUS

## 2022-09-01 MED ORDER — SUCRALFATE 1 G PO TABS
1.0000 g | ORAL_TABLET | Freq: Four times a day (QID) | ORAL | Status: DC
  Administered 2022-09-01 – 2022-09-07 (×21): 1 g via ORAL
  Filled 2022-09-01 (×26): qty 1

## 2022-09-01 MED ORDER — ACETAMINOPHEN 500 MG PO TABS
1000.0000 mg | ORAL_TABLET | Freq: Four times a day (QID) | ORAL | Status: DC
  Administered 2022-09-01 – 2022-09-07 (×19): 1000 mg via ORAL
  Filled 2022-09-01 (×24): qty 2

## 2022-09-01 MED ORDER — ACETAMINOPHEN 650 MG RE SUPP
650.0000 mg | Freq: Four times a day (QID) | RECTAL | Status: DC
  Filled 2022-09-01: qty 1

## 2022-09-01 MED ORDER — HYDROMORPHONE HCL 1 MG/ML IJ SOLN
0.2000 mg | Freq: Three times a day (TID) | INTRAMUSCULAR | Status: DC
  Administered 2022-09-01 – 2022-09-02 (×2): 0.2 mg via INTRAVENOUS
  Filled 2022-09-01 (×2): qty 1

## 2022-09-01 MED ORDER — PANTOPRAZOLE SODIUM 40 MG PO TBEC
40.0000 mg | DELAYED_RELEASE_TABLET | Freq: Two times a day (BID) | ORAL | Status: DC
  Administered 2022-09-01: 40 mg via ORAL
  Filled 2022-09-01: qty 1

## 2022-09-01 MED ORDER — LACTATED RINGERS IV SOLN: INTRAVENOUS | Status: DC | PRN

## 2022-09-01 MED ORDER — METOCLOPRAMIDE HCL 5 MG PO TABS
10.0000 mg | ORAL_TABLET | Freq: Three times a day (TID) | ORAL | Status: DC
  Administered 2022-09-01 (×2): 10 mg via ORAL
  Filled 2022-09-01 (×2): qty 1
  Filled 2022-09-01: qty 2
  Filled 2022-09-01 (×4): qty 1
  Filled 2022-09-01: qty 2

## 2022-09-01 SURGICAL SUPPLY — 15 items

## 2022-09-01 NOTE — Progress Notes (Signed)
Physical Therapy Treatment Patient Details Name: Alicia [name] MRN: [id number] DOB: 01/14/1951 Today's Date: 09/01/2022   History of Present Illness 71 y.o. female presented to ED 10/4 with FTT. PMhx: CHf, HTN, CVA, T2DM, CKD, vit D deficiency, PAD, depression, HFpEF    PT Comments    Pt supine in bed on entry. Discussed need for SNF at discharge. Requested pt attempt to get up from a flattened bed and transfer to chair beside bed to simulate transfer to [facility]. Pt agreeable. Ultimately requiring modAx2 for coming to [facility] and unable to come to standing in RW even with totalA. Pt reluctantly agreeable to SNF, however does not want the one she went to previously on [address]. PT will continue to follow acutely.     Recommendations for follow up therapy are one component of a multi-disciplinary discharge planning process, led by the attending physician.  Recommendations may be updated based on patient status, additional functional criteria and insurance authorization.  Follow Up Recommendations  Skilled nursing-short term rehab (<3 hours/day) Can patient physically be transported by private vehicle: No   Assistance Recommended at Discharge Frequent or constant Supervision/Assistance  Patient can return home with the following A little help with walking and/or transfers;Assistance with cooking/housework;Direct supervision/assist for medications management;Assist for transportation;A lot of help with bathing/dressing/bathroom   Equipment Recommendations  None recommended by PT       Precautions / Restrictions Precautions Precautions: Fall Restrictions Weight Bearing Restrictions: No     Mobility  Bed Mobility Overal bed mobility: Needs Assistance Bed Mobility: Supine to Sit, Sit to Supine     Supine to sit: Mod assist, +2 for physical assistance Sit to supine: Total assist   General bed mobility comments: pt able to move LE off bed but lacks ability to bring trunk to upright,  eventually asking for assistance to bring trunk to upright. with modAx2, after attempts to stand pt unable to scoot hips back in bed and ultimately requires totalAx2 for getting back into bed.    Transfers Overall transfer level: Needs assistance Equipment used: Rolling walker (2 wheels) Transfers: Sit to/from Stand             General transfer comment: utilized RW and elevated bed, pt attempts x2 to come to standing and is not able to bring hips up off bed. Pt request to return to bed           Balance Overall balance assessment: Needs assistance Sitting-balance support: No upper extremity supported, Feet supported Sitting balance-Leahy Scale: Fair Sitting balance - Comments: EOB   Standing balance support: Bilateral upper extremity supported, During functional activity Standing balance-Leahy Scale: Poor Standing balance comment: reliant on Therapists for support                            Cognition Arousal/Alertness: Awake/alert Behavior During Therapy: WFL for tasks assessed/performed Overall Cognitive Status: Impaired/Different from baseline Area of Impairment: Following commands, Safety/judgement, Problem solving                       Following Commands: Follows one step commands inconsistently, Follows one step commands with increased time Safety/Judgement: Decreased awareness of safety, Decreased awareness of deficits   Problem Solving: Slow processing, Requires verbal cues General Comments: continues to reports she will be alright going home, requires failure at independence with mobility tasks to reluctantly agree to SNF  General Comments General comments (skin integrity, edema, etc.): pt continues to have poor acceptance of decreased level of function and need for rehab      Pertinent Vitals/Pain Pain Assessment Pain Assessment: Faces Faces Pain Scale: Hurts little more Pain Location: abdomen Pain Descriptors / Indicators:  Discomfort, Grimacing Pain Intervention(s): Limited activity within patient's tolerance, Monitored during session, Repositioned                                          PT Goals (current goals can now be found in the care plan section) Acute Rehab PT Goals Patient Stated Goal: return to walking and feeling ok PT Goal Formulation: With patient Time For Goal Achievement: 09/08/22 Potential to Achieve Goals: Fair Progress towards PT goals: Not progressing toward goals - comment    Frequency    Min 2X/week      PT Plan Frequency needs to be updated       AM-PAC PT "6 Clicks" Mobility   Outcome Measure  Help needed turning from your back to your side while in a flat bed without using bedrails?: A Little Help needed moving from lying on your back to sitting on the side of a flat bed without using bedrails?: A Little Help needed moving to and from a bed to a chair (including a wheelchair)?: A Little Help needed standing up from a chair using your arms (e.g., wheelchair or bedside chair)?: A Little Help needed to walk in hospital room?: A Lot Help needed climbing 3-5 steps with a railing? : Total 6 Click Score: 15    End of Session Equipment Utilized During Treatment: Gait belt Activity Tolerance: Patient tolerated treatment well Patient left: in bed;with call bell/phone within reach;with nursing/sitter in room Nurse Communication: Mobility status PT Visit Diagnosis: Other abnormalities of gait and mobility (R26.89);History of falling (Z91.81);Muscle weakness (generalized) (M62.81)     Time: 2956-2130 PT Time Calculation (min) (ACUTE ONLY): 20 min  Charges:  $Therapeutic Activity: 8-22 mins                     Adeja Sarratt B. Migdalia Dk PT, DPT Acute Rehabilitation Services Please use secure chat or  Call Office [phone]    [place] 09/01/2022, 1:04 PM

## 2022-09-01 NOTE — Anesthesia Preprocedure Evaluation (Signed)
Anesthesia Evaluation  Patient identified by MRN, date of birth, ID band Patient awake    Reviewed: Allergy & Precautions, NPO status , Patient's Chart, lab work & pertinent test results, reviewed documented beta blocker date and time   Airway Mallampati: III  TM Distance: >3 FB Neck ROM: Full    Dental  (+) Edentulous Lower, Edentulous Upper   Pulmonary Current Smoker and Patient abstained from smoking.,    breath sounds clear to auscultation       Cardiovascular hypertension, Pt. on medications and Pt. on home beta blockers + Peripheral Vascular Disease and +CHF   Rhythm:Regular Rate:Normal     Neuro/Psych  Headaches, PSYCHIATRIC DISORDERS Depression CVA    GI/Hepatic PUD, GERD  Medicated,  Endo/Other  diabetes  Renal/GU Renal InsufficiencyRenal disease     Musculoskeletal  (+) Arthritis ,   Abdominal (+) + obese,   Peds  Hematology  (+) Blood dyscrasia, anemia ,   Anesthesia Other Findings   Reproductive/Obstetrics                             Anesthesia Physical  Anesthesia Plan  ASA: 3  Anesthesia Plan: MAC   Post-op Pain Management:    Induction:   PONV Risk Score and Plan: 2 and Propofol infusion and TIVA  Airway Management Planned: Natural Airway and Mask  Additional Equipment: None  Intra-op Plan:   Post-operative Plan:   Informed Consent: I have reviewed the patients History and Physical, chart, labs and discussed the procedure including the risks, benefits and alternatives for the proposed anesthesia with the patient or authorized representative who has indicated his/her understanding and acceptance.     Dental advisory given  Plan Discussed with: CRNA  Anesthesia Plan Comments:         Anesthesia Quick Evaluation

## 2022-09-01 NOTE — Anesthesia Postprocedure Evaluation (Signed)
Anesthesia Post Note  Patient: Alicia [name]  Procedure(s) Performed: ESOPHAGOGASTRODUODENOSCOPY (EGD) WITH PROPOFOL BIOPSY     Patient location during evaluation: Endoscopy Anesthesia Type: MAC Level of consciousness: awake Pain management: pain level controlled Vital Signs Assessment: post-procedure vital signs reviewed and stable Respiratory status: spontaneous breathing Cardiovascular status: stable Postop Assessment: no apparent nausea or vomiting Anesthetic complications: no   No notable events documented.  Last Vitals:  Vitals:   09/01/22 0915 09/01/22 1015  BP: 112/76 (!) 145/78  Pulse:  71  Resp: 17 18  Temp: 36.4 C (!) 36.4 C  SpO2: 97% 100%    Last Pain:  Vitals:   09/01/22 1100  TempSrc:   PainSc: 0-No pain                 Huston Foley

## 2022-09-01 NOTE — Transfer of Care (Signed)
Immediate Anesthesia Transfer of Care Note  Patient: Alicia [name]  Procedure(s) Performed: ESOPHAGOGASTRODUODENOSCOPY (EGD) WITH PROPOFOL BIOPSY  Patient Location: PACU  Anesthesia Type:MAC  Level of Consciousness: drowsy  Airway & Oxygen Therapy: Patient Spontanous Breathing and Patient connected to face mask oxygen  Post-op Assessment: Report given to RN and Post -op Vital signs reviewed and stable  Post vital signs: Reviewed and stable  Last Vitals:  Vitals Value Taken Time  BP 96/57 09/01/22 0848  Temp    Pulse 75 09/01/22 0850  Resp 12 09/01/22 0850  SpO2 98 % 09/01/22 0850  Vitals shown include unvalidated device data.  Last Pain:  Vitals:   09/01/22 0800  TempSrc: Temporal  PainSc: 8       Patients Stated Pain Goal: 2 (62/83/66 2947)  Complications: No notable events documented.

## 2022-09-01 NOTE — Progress Notes (Addendum)
Subjective:   Summary: Alicia [name] is a 71 y.o. year old female currently admitted on the IMTS HD#2 for postprandial abdominal pain with N/V secondary to duodenal ulcers .  Overnight Events: NOE   Pt was seen this AM bedside. She is still complaining of abdominal pain, however her mood seems to be slightly improved. She had a bowel movement yesterday due to the prep, and denies and nausea or vomiting. She denies any shortness of breath.   Objective:  Vital signs in last 24 hours: Vitals:   09/01/22 0849 09/01/22 0900 09/01/22 0915 09/01/22 1015  BP: (!) 96/57 100/61 112/76 (!) 145/78  Pulse: 76 74  71  Resp: '12 18 17 18  '$ Temp: 97.7 F (36.5 C)  97.6 F (36.4 C) (!) 97.5 F (36.4 C)  TempSrc:    Oral  SpO2: 97% 96% 97% 100%  Weight:      Height:       Supplemental O2: Room Air SpO2: 100 % O2 Flow Rate (L/min): 2 L/min   Physical Exam:  Constitutional: Elderly woman with slightly better mood Cardiovascular: RRR, no murmurs, rubs or gallops Pulmonary/Chest: normal work of breathing on room air, lungs clear to auscultation bilaterally, no rhonchi or wheezes heard Abdominal: Normoactive bowel sounds heard, slightly TTP throughout abdomen  Skin: warm and dry Extremities: upper/lower extremity pulses 2+, +1 edema on LE  Filed Weights   08/24/22 1519  Weight: 110 kg     Intake/Output Summary (Last 24 hours) at 09/01/2022 1408 Last data filed at 09/01/2022 0900 Gross per 24 hour  Intake 200 ml  Output 1 ml  Net 199 ml   Net IO Since Admission: 5,093.3 mL [09/01/22 1408]  Pertinent Labs:    Latest Ref Rng & Units 08/31/2022    1:32 AM 08/30/2022    3:32 AM 08/27/2022    4:46 AM  CBC  WBC 4.0 - 10.5 K/uL 6.2  6.7  7.4   Hemoglobin 12.0 - 15.0 g/dL 11.3  10.5  11.7   Hematocrit 36.0 - 46.0 % 34.2  31.4  33.8   Platelets 150 - 400 K/uL 264  227  229        Latest Ref Rng & Units 09/01/2022   10:59 AM 08/31/2022    1:32 AM 08/30/2022     3:32 AM  CMP  Glucose 70 - 99 mg/dL 116  146  61   BUN 8 - 23 mg/dL 35  36  37   Creatinine 0.44 - 1.00 mg/dL 2.49  2.42  2.53   Sodium 135 - 145 mmol/L 136  135  135   Potassium 3.5 - 5.1 mmol/L 4.2  3.8  3.4   Chloride 98 - 111 mmol/L 105  106  102   CO2 22 - 32 mmol/L '20  22  24   '$ Calcium 8.9 - 10.3 mg/dL 8.2  7.7  8.0      Assessment/Plan:   Principal Problem:   Duodenal ulcer and duodenitis Active Problems:  T2DM  CKD4  HTN      Patient Summary: Alicia [name] is a 71 y.o. with a pertinent PMH of HFpEF, HTN, DMII, CKD4, PAD, prior CVA, Vitamin D Deficiency, who presented with progressive weakness and admitted for decreased functional ability and postprandial abdominal pain, found to have duodenal ulcers    #Postprandial Abdominal Pain, Nausea, Vomiting Secondary to Duodenal ulcers + Duodenitis Pt  was taken to endoscopy today and was found to have duodenitis, and non-bleeding duodenal ulcers with a clean ulcer base. Biopsies were also taken to test for H.Pylori  Per GI, suspect ulcers/duodenitis is causing her symptoms. Was able to explain patient her diagnosis and how we may have found the reason for her pain, she seemed to understand. We will now tone down the diaudid and slowly wean her off. Will wait until patient is able to tolerate PO intake and pain Is under control to consider D/C.   Plan:  - Appreciate GI assistance  - Protonix '40mg'$  IV BID  - Carafate Q6H  - Full Liquid diet  - Await pathology results  - Avoid NSAIDs other than aspirin  -  .'2mg'$  Dilaudid, plan to stop  - Increased reglan to 10 TID  - Bentyl '20mg'$   - Consider addition of cymbalta to help with low mood and pain.   #CKDIV #Vitamin D Deficiency  #Anemia of CKD  Pt's most recent creatinine level was 2.49, up from 2.42 yesterday. Likely in the setting of dehydration due to bowel prep. Will continue to monitor.   #Chronic HFpEF #HTN  Pt was found to have +1 LE Edema today, and has a  history of HFpEF, previously on Lasix '40mg'$ . We will continue to monitor her fluid status, and determine requirement for re-addition of her lasix. In the meanwhile, will continue amlodipine '10mg'$ , and metoprolol '100mg'$ .   #Type II DM  Continue SSI, will monitor carefully as patient has had poor intake.   Code: Full Dispo: Anticipated discharge in less than three midnights.   Drucie Opitz, MD PGY-1 Internal Medicine Resident Pager Number [phone] Please contact the on call pager after 5 pm and on weekends at [phone].

## 2022-09-01 NOTE — Interval H&P Note (Signed)
History and Physical Interval Note: No interval changes overnight. Patient is scheduled for EGD this AM to evaluate nausea / vomiting and poor PO intake. Have discussed risks / benefits and she wishes to proceed.  09/01/2022 8:16 AM  Arta Bruce  has presented today for surgery, with the diagnosis of nausea and vomiting.  The various methods of treatment have been discussed with the patient and family. After consideration of risks, benefits and other options for treatment, the patient has consented to  Procedure(s): ESOPHAGOGASTRODUODENOSCOPY (EGD) WITH PROPOFOL (N/A) as a surgical intervention.  The patient's history has been reviewed, patient examined, no change in status, stable for surgery.  I have reviewed the patient's chart and labs.  Questions were answered to the patient's satisfaction.     [place]

## 2022-09-01 NOTE — Anesthesia Procedure Notes (Signed)
Procedure Name: MAC Date/Time: 09/01/2022 8:25 AM  Performed by: Lieutenant Diego, CRNAPre-anesthesia Checklist: Patient identified, Emergency Drugs available, Suction available, Patient being monitored and Timeout performed Patient Re-evaluated:Patient Re-evaluated prior to induction Oxygen Delivery Method: Nasal cannula Preoxygenation: Pre-oxygenation with 100% oxygen Induction Type: IV induction

## 2022-09-01 NOTE — Op Note (Signed)
[facility] Patient Name: Alicia [name] Procedure Date : 09/01/2022 MRN: [id number] Attending MD: Carlota Raspberry. Havery Moros , MD Date of Birth: [birth year] CSN: [id number] Age: 71 Admit Type: Inpatient Procedure:                Upper GI endoscopy Indications:              Generalized abdominal pain, Nausea with vomiting Providers:                Remo Lipps P. Havery Moros, MD, Doristine Johns, RN,                            Darliss Cheney, Technician Referring MD:              Medicines:                Monitored Anesthesia Care Complications:            No immediate complications. Estimated blood loss:                            Minimal. Estimated Blood Loss:     Estimated blood loss was minimal. Procedure:                Pre-Anesthesia Assessment:                           - Prior to the procedure, a History and Physical                            was performed, and patient medications and                            allergies were reviewed. The patient's tolerance of                            previous anesthesia was also reviewed. The risks                            and benefits of the procedure and the sedation                            options and risks were discussed with the patient.                            All questions were answered, and informed consent                            was obtained. Prior Anticoagulants: The patient has                            taken no previous anticoagulant or antiplatelet                            agents. ASA Grade Assessment: III - A patient with  severe systemic disease. After reviewing the risks                            and benefits, the patient was deemed in                            satisfactory condition to undergo the procedure.                           After obtaining informed consent, the endoscope was                            passed under direct vision. Throughout the                             procedure, the patient's blood pressure, pulse, and                            oxygen saturations were monitored continuously. The                            [id number]-[id number] ([id number]) Olympus endoscope was introduced                            through the mouth, and advanced to the second part                            of duodenum. The upper GI endoscopy was                            accomplished without difficulty. The patient                            tolerated the procedure well. Scope In: Scope Out: Findings:      Esophagogastric landmarks were identified: the Z-line was found at 37       cm, the gastroesophageal junction was found at 37 cm and the upper       extent of the gastric folds was found at 39 cm from the incisors.      A 2 cm hiatal hernia was present.      Localized mucosal changes characterized by a focal area of nodularity       was found at the gastroesophageal junction. Biopsies were taken with a       cold forceps for histology.      The exam of the esophagus was otherwise normal.      Localized mucosal changes were found in the gastric antrum (altered       appearance of mucosa in proximal antrum without nodularity) and in the       prepyloric region of the stomach (focal nodularity). Biopsies were taken       with a cold forceps for histology in each area.      The exam of the stomach was otherwise normal.      Biopsies were taken with a cold forceps in the gastric body and in the       gastric  antrum for Helicobacter pylori testing.      Two non-bleeding deeply cratered duodenal ulcers with a clean ulcer base       (Forrest Class III) were found in the duodenal bulb and in the entering       second portion of the duodenum, within the sweep. The smaller lesion was       about 8-67m in size, the largest lesion was 15 mm in largest dimension.       No high risk stigmata for bleeding. Significant edema, inflammatory       changes noted in the area and in the bulb.  Biopsies were taken with a       cold forceps for histology.      Diffuse moderate inflammation characterized by congestion (edema),       erythema, friability and granularity was found in the duodenal bulb.      The exam of the duodenum was otherwise normal. Impression:               - Esophagogastric landmarks identified.                           - 2 cm hiatal hernia.                           - Nodular mucosa at the GEJ. Biopsied.                           - Altered mucosa in the antrum and prepyloric                            region of the stomach. Biopsied.                           - Non-bleeding duodenal ulcers with a clean ulcer                            base (Forrest Class III). Biopsied.                           - Duodenitis.                           - Biopsies were taken with a cold forceps for                            Helicobacter pylori testing.                           Suspect ulcers / duodenitis is causing the                            patient's symptoms. She warrants high dose PPI for                            now and would add carafate 1 gm every 6 hours. Keep  on full liquid or soft diet until she is feeling                            better, significant edema in the duodenal sweep,                            but she is not obstructed. Will tolerated liquids                            or softer food better until ulcers / inflammation                            improves. Recommendation:           - Return patient to hospital ward for ongoing care.                           - Full liquid diet.                           - Continue present medications.                           - Change protonix to '40mg'$  IV twice daily while                            hospitalized                           - Add oral carafate tablet 1 gm every 6 hours (this                            will make stools dark)                           - Await pathology  results.                           - Avoid NSAIDs other than baby aspirin Procedure Code(s):        --- Professional ---                           [phone], Esophagogastroduodenoscopy, flexible,                            transoral; with biopsy, single or multiple Diagnosis Code(s):        --- Professional ---                           K44.9, Diaphragmatic hernia without obstruction or                            gangrene                           K22.8, Other specified diseases of esophagus  K31.89, Other diseases of stomach and duodenum                           K26.9, Duodenal ulcer, unspecified as acute or                            chronic, without hemorrhage or perforation                           K29.80, Duodenitis without bleeding                           R10.84, Generalized abdominal pain                           R11.2, Nausea with vomiting, unspecified CPT copyright 2019 [employer]. All rights reserved. The codes documented in this report are preliminary and upon coder review may  be revised to meet current compliance requirements. Remo Lipps P. Verenis Nicosia, MD 09/01/2022 8:52:47 AM This report has been signed electronically. Number of Addenda: 0

## 2022-09-01 NOTE — Progress Notes (Signed)
Occupational Therapy Treatment Patient Details Name: Alicia [name] MRN: [id number] DOB: 1951/04/05 Today's Date: 09/01/2022   History of present illness 71 y.o. female presented to ED 10/4 with FTT. PMhx: CHf, HTN, CVA, T2DM, CKD, vit D deficiency, PAD, depression, HFpEF   OT comments  Patient with minimal progression toward patient focused goals.  In fact, she is needing more assist this date for basic transfers and lower body ADL than when this OT saw her in the ED.  Goals will be maintained at this point, but could be downgraded.  Discharge plan correctly updated to SNF for post acute rehab prior to returning home.  OT will continue efforts in the acute setting.     Recommendations for follow up therapy are one component of a multi-disciplinary discharge planning process, led by the attending physician.  Recommendations may be updated based on patient status, additional functional criteria and insurance authorization.    Follow Up Recommendations  Skilled nursing-short term rehab (<3 hours/day)    Assistance Recommended at Discharge Frequent or constant Supervision/Assistance  Patient can return home with the following  Help with stairs or ramp for entrance;Assist for transportation;Assistance with cooking/housework;A lot of help with bathing/dressing/bathroom;A lot of help with walking and/or transfers   Equipment Recommendations  None recommended by OT    Recommendations for Other Services      Precautions / Restrictions Precautions Precautions: Fall Restrictions Weight Bearing Restrictions: No       Mobility Bed Mobility   Bed Mobility: Supine to Sit, Sit to Supine     Supine to sit: Mod assist, Max assist Sit to supine: Max assist        Transfers                         Balance Overall balance assessment: Needs assistance Sitting-balance support: No upper extremity supported, Feet supported Sitting balance-Leahy Scale: Fair                                      ADL either performed or assessed with clinical judgement   ADL   Eating/Feeding: Set up;Bed level   Grooming: Wash/dry hands;Wash/dry face;Sitting;Min guard           Upper Body Dressing : Minimal assistance;Sitting   Lower Body Dressing: Moderate assistance;Bed level     Toilet Transfer Details (indicate cue type and reason): unable to come to a ful stand this date.                Extremity/Trunk Assessment Upper Extremity Assessment Upper Extremity Assessment: Generalized weakness RUE Deficits / Details: chronic R shoulder dysfunction   Lower Extremity Assessment Lower Extremity Assessment: Defer to PT evaluation   Cervical / Trunk Assessment Cervical / Trunk Assessment: Kyphotic    Vision       Perception     Praxis      Cognition Arousal/Alertness: Awake/alert Behavior During Therapy: Flat affect                             Safety/Judgement: Decreased awareness of safety, Decreased awareness of deficits              Exercises      Shoulder Instructions       General Comments pt continues to have poor acceptance of decreased level of function and need for  rehab    Pertinent Vitals/ Pain       Pain Assessment Faces Pain Scale: Hurts a little bit Pain Location: generalized to her stomach Pain Descriptors / Indicators: Tightness Pain Intervention(s): Monitored during session                                                          Frequency  Min 2X/week        Progress Toward Goals  OT Goals(current goals can now be found in the care plan section)     Acute Rehab OT Goals OT Goal Formulation: With patient Time For Goal Achievement: 09/08/22 Potential to Achieve Goals: [place] Discharge plan remains appropriate    Co-evaluation                 AM-PAC OT "6 Clicks" Daily Activity     Outcome Measure   Help from another person eating  meals?: None Help from another person taking care of personal grooming?: A Little Help from another person toileting, which includes using toliet, bedpan, or urinal?: A Lot Help from another person bathing (including washing, rinsing, drying)?: A Lot Help from another person to put on and taking off regular upper body clothing?: A Little Help from another person to put on and taking off regular lower body clothing?: Total 6 Click Score: 15    End of Session    OT Visit Diagnosis: Unsteadiness on feet (R26.81);Muscle weakness (generalized) (M62.81);History of falling (Z91.81)   Activity Tolerance Patient limited by pain   Patient Left in bed;with call bell/phone within reach;with bed alarm set   Nurse Communication Mobility status        Time: 0722-5750 OT Time Calculation (min): 17 min  Charges: OT General Charges $OT Visit: 1 Visit OT Treatments $Self Care/Home Management : 8-22 mins  09/01/2022  RP, OTR/L  Acute Rehabilitation Services  Office:  [phone]   Metta Clines 09/01/2022, 1:29 PM

## 2022-09-02 DIAGNOSIS — K269 Duodenal ulcer, unspecified as acute or chronic, without hemorrhage or perforation: Secondary | ICD-10-CM | POA: Diagnosis not present

## 2022-09-02 DIAGNOSIS — K2980 Duodenitis without bleeding: Secondary | ICD-10-CM | POA: Diagnosis not present

## 2022-09-02 LAB — GLUCOSE, CAPILLARY
Glucose-Capillary: 127 mg/dL — ABNORMAL HIGH (ref 70–99)
Glucose-Capillary: 139 mg/dL — ABNORMAL HIGH (ref 70–99)
Glucose-Capillary: 146 mg/dL — ABNORMAL HIGH (ref 70–99)
Glucose-Capillary: 155 mg/dL — ABNORMAL HIGH (ref 70–99)
Glucose-Capillary: 171 mg/dL — ABNORMAL HIGH (ref 70–99)
Glucose-Capillary: 190 mg/dL — ABNORMAL HIGH (ref 70–99)

## 2022-09-02 LAB — CALCIUM, IONIZED: Calcium, Ionized, Serum: 4.7 mg/dL (ref 4.5–5.6)

## 2022-09-02 LAB — SURGICAL PATHOLOGY

## 2022-09-02 MED ORDER — HYDROMORPHONE HCL 1 MG/ML IJ SOLN
0.5000 mg | INTRAMUSCULAR | Status: DC | PRN
  Administered 2022-09-02 – 2022-09-13 (×13): 0.5 mg via INTRAVENOUS
  Filled 2022-09-02 (×13): qty 1

## 2022-09-02 MED ORDER — DICYCLOMINE HCL 20 MG PO TABS
20.0000 mg | ORAL_TABLET | Freq: Three times a day (TID) | ORAL | Status: DC
  Administered 2022-09-02 (×2): 20 mg via ORAL
  Filled 2022-09-02 (×3): qty 1

## 2022-09-02 MED ORDER — PANTOPRAZOLE SODIUM 40 MG IV SOLR
40.0000 mg | Freq: Two times a day (BID) | INTRAVENOUS | Status: DC
  Administered 2022-09-02: 40 mg via INTRAVENOUS
  Filled 2022-09-02: qty 10

## 2022-09-02 MED ORDER — PANTOPRAZOLE SODIUM 40 MG IV SOLR
40.0000 mg | Freq: Two times a day (BID) | INTRAVENOUS | Status: DC
  Administered 2022-09-02 – 2022-09-07 (×10): 40 mg via INTRAVENOUS
  Filled 2022-09-02 (×10): qty 10

## 2022-09-02 MED ORDER — HYDROMORPHONE HCL 1 MG/ML IJ SOLN: 0.5000 mg | Freq: Three times a day (TID) | INTRAMUSCULAR | Status: DC | PRN

## 2022-09-02 MED ORDER — DULOXETINE HCL 30 MG PO CPEP
30.0000 mg | ORAL_CAPSULE | Freq: Every day | ORAL | Status: DC
  Administered 2022-09-02 – 2022-09-05 (×4): 30 mg via ORAL
  Filled 2022-09-02 (×4): qty 1

## 2022-09-02 MED ORDER — DULOXETINE HCL 20 MG PO CPEP
20.0000 mg | ORAL_CAPSULE | Freq: Every day | ORAL | Status: DC
  Filled 2022-09-02: qty 1

## 2022-09-02 MED ORDER — DEXTROSE IN LACTATED RINGERS 5 % IV SOLN: INTRAVENOUS | Status: DC

## 2022-09-02 MED ORDER — HYDROMORPHONE HCL 1 MG/ML IJ SOLN: 0.5000 mg | Freq: Three times a day (TID) | INTRAMUSCULAR | Status: DC

## 2022-09-02 MED ORDER — HYDROMORPHONE HCL 1 MG/ML IJ SOLN
0.5000 mg | Freq: Once | INTRAMUSCULAR | Status: AC
  Administered 2022-09-02: 0.5 mg via INTRAVENOUS
  Filled 2022-09-02: qty 1

## 2022-09-02 MED ORDER — PANTOPRAZOLE SODIUM 40 MG PO TBEC: 40.0000 mg | DELAYED_RELEASE_TABLET | Freq: Two times a day (BID) | ORAL | Status: DC

## 2022-09-02 NOTE — Progress Notes (Addendum)
Gastroenterology Inpatient Follow Up    Subjective: Still having ab pain that is generalized. She states that this pain has been going on for months. Passing BMs seems to worsen the pain. She has not been eating well but has a poor appetite. Denies N&V.  Objective: Vital signs in last 24 hours: Temp:  [97.5 F (36.4 C)-98.3 F (36.8 C)] 98.1 F (36.7 C) (10/13 0940) Pulse Rate:  [71-87] 87 (10/13 0405) Resp:  [18-20] 20 (10/13 0940) BP: (117-141)/(61-80) 124/61 (10/13 0940) SpO2:  [97 %-100 %] 97 % (10/13 0940) Last BM Date :  (PTA)  Intake/Output from previous day: 10/12 0701 - 10/13 0700 In: 260 [P.O.:60; I.V.:200] Out: 102 [Urine:100; Stool:2] Intake/Output this shift: No intake/output data recorded.  General appearance: alert and cooperative Resp: no increased WOB Cardio: regular rate GI: mildly tender throughout the abdomen Extremities: no BLE edema  Lab Results: Recent Labs    08/31/22 0132  WBC 6.2  HGB 11.3*  HCT 34.2*  PLT 264   BMET Recent Labs    08/31/22 0132 09/01/22 1059  NA 135 136  K 3.8 4.2  CL 106 105  CO2 22 20*  GLUCOSE 146* 116*  BUN 36* 35*  CREATININE 2.42* 2.49*  CALCIUM 7.7* 8.2*   LFT No results for input(s): "PROT", "ALBUMIN", "AST", "ALT", "ALKPHOS", "BILITOT", "BILIDIR", "IBILI" in the last 72 hours. PT/INR No results for input(s): "LABPROT", "INR" in the last 72 hours. Hepatitis Panel No results for input(s): "HEPBSAG", "HCVAB", "HEPAIGM", "HEPBIGM" in the last 72 hours. C-Diff No results for input(s): "CDIFFTOX" in the last 72 hours.  Studies/Results: No results found.  Medications: I have reviewed the patient's current medications. Scheduled:  acetaminophen  1,000 mg Oral Q6H   Or   acetaminophen  650 mg Rectal Q6H   amLODipine  10 mg Oral Daily   aspirin EC  81 mg Oral Daily   diclofenac Sodium  4 g Topical QID   dicyclomine  20 mg Oral Q8H   DULoxetine  30 mg Oral Daily   enoxaparin (LOVENOX) injection   40 mg Subcutaneous Q24H   feeding supplement  237 mL Oral BID BM   insulin aspart  0-6 Units Subcutaneous Q4H   metoprolol succinate  100 mg Oral Daily   multivitamin  1 tablet Oral QHS   pantoprazole (PROTONIX) IV  40 mg Intravenous BID   rosuvastatin  10 mg Oral QPM   sorbitol, milk of mag, mineral oil, glycerin (SMOG) enema  400 mL Rectal Once   sucralfate  1 g Oral Q6H   Continuous:  dextrose 5% lactated ringers 100 mL/hr at 09/02/22 0950   EYC:[id number] (DILAUDID) injection  EGD 09/01/22: - Esophagogastric landmarks identified. - 2 cm hiatal hernia. - Nodular mucosa at the GEJ. Biopsied. - Altered mucosa in the antrum and prepyloric region of the stomach. Biopsied. - Non-bleeding duodenal ulcers with a clean ulcer base (Forrest Class III). Biopsied. - Duodenitis. - Biopsies were taken with a cold forceps for Helicobacter pylori testing. Suspect ulcers / duodenitis is causing the patient's symptoms. She warrants high dose PPI for now and would add carafate 1 gm every 6 hours. Keep on full liquid or soft diet until she is feeling better, significant edema in the duodenal sweep, but she is not obstructed. Will tolerated liquids or softer food better until ulcers / inflammation improves.  Assessment/Plan: 71 year old female with history of H pylori, chronic abdominal pain, HFpEF, CKD, PAD, prior CVA p/w ab pain and  nausea. EGD 10/12 showed duodenal ulcers, which seem likely to be the source of her pain. Patient has not had any improvement in her abdominal pain yet but she may need more time on PPI and carafate therapy. Gastric biopsies showed inflammation in the small bowel and injury to the stomach lining. Biopsies were negative for H pylori.  - Continue PPI and carafate - Full liquid or soft diet until feeling better   LOS: 3 days   Sharyn Creamer 09/02/2022, 11:21 AM

## 2022-09-02 NOTE — Progress Notes (Signed)
Subjective:   Summary: Alicia [name] is a 71 y.o. year old female currently admitted on the IMTS HD#3 for postprandial abdominal pain secondary to duodenal ulcers.  Overnight Events: NOE   Pt was seen this AM bedside. She continues to continue in be in abdominal pain and states her pain has the remained the same.   Objective:  Vital signs in last 24 hours: Vitals:   09/01/22 1015 09/01/22 1621 09/01/22 2117 09/02/22 0405  BP: (!) 145/78 137/73 117/80 (!) 141/70  Pulse: 71 71 79 87  Resp: '18 18 18 18  '$ Temp: (!) 97.5 F (36.4 C) (!) 97.5 F (36.4 C) 98.2 F (36.8 C) 98.3 F (36.8 C)  TempSrc: Oral Oral Oral Oral  SpO2: 100% 98% 100% 100%  Weight:      Height:       Supplemental O2: Room Air SpO2: 100 % O2 Flow Rate (L/min): 2 L/min   Physical Exam:  Constitutional: Elderly woman in pain  Cardiovascular: RRR, no murmurs, rubs or gallops Pulmonary/Chest: normal work of breathing on room air, lungs clear to auscultation bilaterally Abdominal: soft, non-tender, non-distended Skin: warm and dry Extremities: upper/lower extremity pulses 2+, no lower extremity edema present  Filed Weights   08/24/22 1519  Weight: 110 kg     Intake/Output Summary (Last 24 hours) at 09/02/2022 0817 Last data filed at 09/02/2022 0735 Gross per 24 hour  Intake 260 ml  Output 101 ml  Net 159 ml   Net IO Since Admission: 5,052.3 mL [09/02/22 0817]  Pertinent Labs:    Latest Ref Rng & Units 08/31/2022    1:32 AM 08/30/2022    3:32 AM 08/27/2022    4:46 AM  CBC  WBC 4.0 - 10.5 K/uL 6.2  6.7  7.4   Hemoglobin 12.0 - 15.0 g/dL 11.3  10.5  11.7   Hematocrit 36.0 - 46.0 % 34.2  31.4  33.8   Platelets 150 - 400 K/uL 264  227  229        Latest Ref Rng & Units 09/01/2022   10:59 AM 08/31/2022    1:32 AM 08/30/2022    3:32 AM  CMP  Glucose 70 - 99 mg/dL 116  146  61   BUN 8 - 23 mg/dL 35  36  37   Creatinine 0.44 - 1.00 mg/dL 2.49  2.42  2.53   Sodium 135  - 145 mmol/L 136  135  135   Potassium 3.5 - 5.1 mmol/L 4.2  3.8  3.4   Chloride 98 - 111 mmol/L 105  106  102   CO2 22 - 32 mmol/L '20  22  24   '$ Calcium 8.9 - 10.3 mg/dL 8.2  7.7  8.0     Assessment/Plan:   Principal Problem:   Generalized weakness Active Problems:   Pressure injury of skin   Nausea and vomiting   Diarrhea   Duodenal ulcer   Mucosal abnormality of stomach   Mucosal abnormality of esophagus   Patient Summary: Alicia [name] is a 71 y.o. with a pertinent PMH of HFpEF, HTN, DMII, CKD4, PAD, prior CVA, Vitamin D Deficiency, who presented with progressive weakness and post-prandial abdominal pain and admitted for decreased functional ability and postprandial abdominal pain, found to have duodenal ulcers.    #Postprandial Abdominal Pain, vomiting Secondary to Duodenal Ulcers and Duodenitis Patient continues to have pain in her in her  abdomen.  EGD revealed duodenal ulcers and duodenitis.  Currently trying to find the past pain regimen for this patient.  Still waiting on biopsies to return, appreciate GI assistance.  At this point we will continue on with the Dilaudid and increase to .5 mg.  She had a complication with her IV, however was able to put in a new 1.  Now we can give her the Protonix 40 mg IV twice daily.  We will continue with the Carafate every 6 hours.  We will continue with full liquid diet for her.  We will also add on Cymbalta 30 mg, and continue the Bentyl 20 mg, as well as the Reglan on hopes of getting this patient's pain under control so that she may attempt p.o. intake again.  #CKD4 #Vitamin D deficiency #Anemia of chronic kidney disease Patient's most recent creatinine was 2.49, as she was not able to get labs this morning.  We will continue to monitor.  Patient also seem dehydrated on exam, will start her on fluids.  #Chronic HFpEF #HTN Patient was found to have +1 lower extremity edema, and has a history of heart failure with preserved  ejection fraction previously on Lasix 40 mg.  We will continue to monitor fluid status and determine requirement of the readdition of her Lasix.  In the meanwhile we will continue amlodipine 10 mg and metoprolol 100 mg.  #Type II DM Continue sliding scale insulin, will monitor carefully as patient has had poor p.o. intake   Code: Full Dispo: Anticipated discharge in less than three midnights  Drucie Opitz, MD PGY-1 Internal Medicine Resident Pager Number [phone] Please contact the on call pager after 5 pm and on weekends at [phone].

## 2022-09-03 ENCOUNTER — Inpatient Hospital Stay (HOSPITAL_COMMUNITY): Payer: Medicare HMO

## 2022-09-03 DIAGNOSIS — K5900 Constipation, unspecified: Secondary | ICD-10-CM | POA: Diagnosis not present

## 2022-09-03 DIAGNOSIS — R1084 Generalized abdominal pain: Secondary | ICD-10-CM | POA: Diagnosis not present

## 2022-09-03 DIAGNOSIS — K269 Duodenal ulcer, unspecified as acute or chronic, without hemorrhage or perforation: Secondary | ICD-10-CM | POA: Diagnosis not present

## 2022-09-03 DIAGNOSIS — R112 Nausea with vomiting, unspecified: Secondary | ICD-10-CM | POA: Diagnosis not present

## 2022-09-03 DIAGNOSIS — K2980 Duodenitis without bleeding: Secondary | ICD-10-CM | POA: Diagnosis not present

## 2022-09-03 LAB — BASIC METABOLIC PANEL
Anion gap: 9 (ref 5–15)
BUN: 42 mg/dL — ABNORMAL HIGH (ref 8–23)
CO2: 16 mmol/L — ABNORMAL LOW (ref 22–32)
Calcium: 7.3 mg/dL — ABNORMAL LOW (ref 8.9–10.3)
Chloride: 107 mmol/L (ref 98–111)
Creatinine, Ser: 3.25 mg/dL — ABNORMAL HIGH (ref 0.44–1.00)
GFR, Estimated: 15 mL/min — ABNORMAL LOW (ref >60)
Glucose, Bld: 135 mg/dL — ABNORMAL HIGH (ref 70–99)
Potassium: 4.2 mmol/L (ref 3.5–5.1)
Sodium: 132 mmol/L — ABNORMAL LOW (ref 135–145)

## 2022-09-03 LAB — GLUCOSE, CAPILLARY
Glucose-Capillary: 131 mg/dL — ABNORMAL HIGH (ref 70–99)
Glucose-Capillary: 134 mg/dL — ABNORMAL HIGH (ref 70–99)
Glucose-Capillary: 144 mg/dL — ABNORMAL HIGH (ref 70–99)
Glucose-Capillary: 168 mg/dL — ABNORMAL HIGH (ref 70–99)
Glucose-Capillary: 178 mg/dL — ABNORMAL HIGH (ref 70–99)
Glucose-Capillary: 206 mg/dL — ABNORMAL HIGH (ref 70–99)

## 2022-09-03 LAB — GASTROINTESTINAL PANEL BY PCR, STOOL (REPLACES STOOL CULTURE)

## 2022-09-03 MED ORDER — METOCLOPRAMIDE HCL 5 MG/ML IJ SOLN
10.0000 mg | Freq: Three times a day (TID) | INTRAMUSCULAR | Status: DC
  Administered 2022-09-03 – 2022-09-05 (×6): 10 mg via INTRAVENOUS
  Filled 2022-09-03 (×6): qty 2

## 2022-09-03 MED ORDER — LACTATED RINGERS IV BOLUS
1000.0000 mL | Freq: Once | INTRAVENOUS | Status: AC
  Administered 2022-09-03: 1000 mL via INTRAVENOUS

## 2022-09-03 NOTE — Progress Notes (Signed)
Subjective:   Summary: Alicia [name] is a 71 y.o. year old female currently admitted on the IMTS HD#4 for postprandial abdominal pain secondary to duodenal ulcers.  Overnight Events: Vomiting after taking PO medications   Pt was seen this AM bedside. She states that her pain is slightly better than before, however had an episode of emesis after taking her acetaminophen this morning.   Objective:  Vital signs in last 24 hours: Vitals:   09/02/22 1727 09/02/22 2110 09/03/22 0358 09/03/22 0906  BP: 129/75 (!) 112/50 122/68 135/71  Pulse: 80 70 71 77  Resp: '18 18 18 16  '$ Temp: (!) 97.4 F (36.3 C) 98 F (36.7 C) 97.6 F (36.4 C) 98 F (36.7 C)  TempSrc: Oral Oral  Oral  SpO2: 99% 100% 98% 100%  Weight:      Height:       Supplemental O2: Room Air SpO2: 100 % O2 Flow Rate (L/min): 2 L/min   Physical Exam:  Constitutional: Elderly woman with low mood , in no acute distress Cardiovascular: RRR, no murmurs, rubs or gallops Pulmonary/Chest: normal work of breathing on room air, lungs clear to auscultation bilaterally Abdominal: Normoactive bowel sounds, generalized tenderness to light palpation  Skin: warm and dry Extremities: upper/lower extremity pulses 2+, no lower extremity edema present  Filed Weights   08/24/22 1519  Weight: 110 kg     Intake/Output Summary (Last 24 hours) at 09/03/2022 1223 Last data filed at 09/03/2022 0600 Gross per 24 hour  Intake 1987.77 ml  Output 0 ml  Net 1987.77 ml   Net IO Since Admission: 7,053.89 mL [09/03/22 1223]  Pertinent Labs:    Latest Ref Rng & Units 08/31/2022    1:32 AM 08/30/2022    3:32 AM 08/27/2022    4:46 AM  CBC  WBC 4.0 - 10.5 K/uL 6.2  6.7  7.4   Hemoglobin 12.0 - 15.0 g/dL 11.3  10.5  11.7   Hematocrit 36.0 - 46.0 % 34.2  31.4  33.8   Platelets 150 - 400 K/uL 264  227  229        Latest Ref Rng & Units 09/03/2022    9:19 AM 09/01/2022   10:59 AM 08/31/2022    1:32 AM  CMP   Glucose 70 - 99 mg/dL 135  116  146   BUN 8 - 23 mg/dL 42  35  36   Creatinine 0.44 - 1.00 mg/dL 3.25  2.49  2.42   Sodium 135 - 145 mmol/L 132  136  135   Potassium 3.5 - 5.1 mmol/L 4.2  4.2  3.8   Chloride 98 - 111 mmol/L 107  105  106   CO2 22 - 32 mmol/L '16  20  22   '$ Calcium 8.9 - 10.3 mg/dL 7.3  8.2  7.7      Assessment/Plan:   Principal Problem:   Generalized weakness Active Problems:   Pressure injury of skin   Nausea and vomiting   Diarrhea   Duodenal ulcer   Mucosal abnormality of stomach   Mucosal abnormality of esophagus   Patient Summary: Alicia [name] is a 71 y.o. with a pertinent PMH of HFpEF, HTN, DMII, CKD4, PAD, prior CVA, Vitamin D Deficiency, who presented with progressive weakness and post-prandial abdominal pain and admitted for decreased functional ability and postprandial abdominal pain, found to have duodenal ulcers.    #Postprandial  abdominal pain, Secondary to Duodenal Ulcers and Duodenitis Pt states her pain has decreased in intensity since yesterday, which is the first time in hospital course she has felt this way. On abdominal exam, there was mild tenderness to palpation, nowhere near the extent of pain on palpation she was experiencing throughout the week. We will continue with Dilaudid .'5mg'$ , IV Protonix '40mg'$  BID, Carafate Q6H. Pathology is still returning for biopsy for H.Pylori infection which could have attributed to her symptoms. Per GI, she also may have gastroperesis and they agree with Reglan. Patient has had poor PO intake since admission, one option for nutrition could be a Jejunum feeding tube, will consider if patient continues poor po intake.   Plan:  - Continue current medical regimen - Metoclopramide '10mg'$  TID - Await pathology reports - Appreciate GI Assistance - KUB to evaluate if constipation has resolved - Will wean off Dilaudid if constipation is still present   #CKD4 Vitamin D Deficiency  Anemia of CKD  Creatinine  has jumped from 3.25 to 2.49, likely in the setting of poor fluid intake, loose stools, and vomiting episodes. Currently giving patient a bolus of fluid, will monitor kidney function.  #Chronic HFpEF #HTN Patient was found to have +1 lower extremity edema, and has a history of heart failure with preserved ejection fraction previously on Lasix 40 mg.  We will continue to monitor fluid status and determine requirement of the readdition of her Lasix.  In the meanwhile we will continue amlodipine 10 mg and metoprolol 100 mg.   #Type II DM Continue sliding scale insulin, will monitor carefully as patient has had poor p.o. intake   Code: Full    Dispo: Anticipated discharge in less than five midnights.   Drucie Opitz, MD PGY-1 Internal Medicine Resident Pager Number [phone] Please contact the on call pager after 5 pm and on weekends at [phone].

## 2022-09-03 NOTE — Progress Notes (Signed)
[facility] Progress Note  Chief Complaint: Generalized abdominal pain, nausea and vomiting  History:  Signout received from colleagues, chart review performed.  Patient seen and examined. She continues to have generalized abdominal pain that she cannot localize.  Says she is unable to tolerate food or liquids and feels miserable. Gives little additional history. Was on aspirin at the time of admission for vascular disease and remains on aspirin. Medical resident note from yesterday reviewed. ROS: Cardiovascular: No chest pain Respiratory: Dyspnea   Objective:   Current Facility-Administered Medications:    acetaminophen (TYLENOL) tablet 1,000 mg, 1,000 mg, Oral, Q6H, 1,000 mg at 09/03/22 0041 **OR** acetaminophen (TYLENOL) suppository 650 mg, 650 mg, Rectal, Q6H, Nooruddin, Saad, MD   amLODipine (NORVASC) tablet 10 mg, 10 mg, Oral, Daily, Lacinda Axon, MD, 10 mg at 09/03/22 6283   aspirin EC tablet 81 mg, 81 mg, Oral, Daily, Velna Ochs, MD, 81 mg at 09/03/22 0938   dextrose 5 % in lactated ringers infusion, , Intravenous, Continuous, Rick Duff, MD, Last Rate: 100 mL/hr at 09/03/22 0805, New Bag at 09/03/22 0805   diclofenac Sodium (VOLTAREN) 1 % topical gel 4 g, 4 g, Topical, QID, Romana Juniper, MD, 4 g at 09/02/22 1721   dicyclomine (BENTYL) tablet 20 mg, 20 mg, Oral, Q8H, Rick Duff, MD, 20 mg at 09/02/22 2134   DULoxetine (CYMBALTA) DR capsule 30 mg, 30 mg, Oral, Daily, Rick Duff, MD, 30 mg at 09/03/22 0938   enoxaparin (LOVENOX) injection 40 mg, 40 mg, Subcutaneous, Q24H, Guilloud, Hoyle Sauer, MD, 40 mg at 09/02/22 2132   feeding supplement (ENSURE ENLIVE / ENSURE PLUS) liquid 237 mL, 237 mL, Oral, BID BM, Lacinda Axon, MD, 237 mL at 09/01/22 1022   HYDROmorphone (DILAUDID) injection 0.5 mg, 0.5 mg, Intravenous, Q4H PRN, Rick Duff, MD, 0.5 mg at 09/02/22 2022   insulin aspart (novoLOG) injection 0-6 Units, 0-6 Units,  Subcutaneous, Q4H, Rick Duff, MD, 1 Units at 09/03/22 0422   metoprolol succinate (TOPROL-XL) 24 hr tablet 100 mg, 100 mg, Oral, Daily, Lacinda Axon, MD, 100 mg at 09/03/22 1517   multivitamin (RENA-VIT) tablet 1 tablet, 1 tablet, Oral, QHS, Velna Ochs, MD, 1 tablet at 09/02/22 2134   pantoprazole (PROTONIX) injection 40 mg, 40 mg, Intravenous, BID, Rick Duff, MD, 40 mg at 09/03/22 0939   rosuvastatin (CRESTOR) tablet 10 mg, 10 mg, Oral, QPM, Lacinda Axon, MD, 10 mg at 09/02/22 1720   sucralfate (CARAFATE) tablet 1 g, 1 g, Oral, Q6H, Armbruster, Carlota Raspberry, MD, 1 g at 09/03/22 0041   dextrose 5% lactated ringers 100 mL/hr at 09/03/22 0805     Vital signs in last 24 hrs: Vitals:   09/03/22 0358 09/03/22 0906  BP: 122/68 135/71  Pulse: 71 77  Resp: 18 16  Temp: 97.6 F (36.4 C) 98 F (36.7 C)  SpO2: 98% 100%    Intake/Output Summary (Last 24 hours) at 09/03/2022 1144 Last data filed at 09/03/2022 0600 Gross per 24 hour  Intake 1987.77 ml  Output 0 ml  Net 1987.77 ml     Physical Exam Fatigue, talks a little, just wants to rest, limited historian.  Does not appear to feel well. HEENT: sclera anicteric, oral mucosa without lesions Neck: supple, no thyromegaly, JVD or lymphadenopathy Cardiac: RRR without murmurs, S1S2 heard Pulm: clear to auscultation bilaterally, normal RR and effort noted Abdomen: soft, generalized tenderness to light palpation, perhaps more so in the mid upper abdomen, with active bowel sounds. Skin; warm and dry,  no jaundice  Recent Labs:     Latest Ref Rng & Units 08/31/2022    1:32 AM 08/30/2022    3:32 AM 08/27/2022    4:46 AM  CBC  WBC 4.0 - 10.5 K/uL 6.2  6.7  7.4   Hemoglobin 12.0 - 15.0 g/dL 11.3  10.5  11.7   Hematocrit 36.0 - 46.0 % 34.2  31.4  33.8   Platelets 150 - 400 K/uL 264  227  229     No results for input(s): "INR" in the last 168 hours.    Latest Ref Rng & Units 09/03/2022    9:19 AM  09/01/2022   10:59 AM 08/31/2022    1:32 AM  CMP  Glucose 70 - 99 mg/dL 135  116  146   BUN 8 - 23 mg/dL 42  35  36   Creatinine 0.44 - 1.00 mg/dL 3.25  2.49  2.42   Sodium 135 - 145 mmol/L 132  136  135   Potassium 3.5 - 5.1 mmol/L 4.2  4.2  3.8   Chloride 98 - 111 mmol/L 107  105  106   CO2 22 - 32 mmol/L '16  20  22   '$ Calcium 8.9 - 10.3 mg/dL 7.3  8.2  7.7      Radiologic studies:   Assessment & Plan  Assessment:  Generalized abdominal pain Duodenal ulcers, aspirin most likely contributing, biopsies for H. pylori pending.  I agree with Dr. Doyne Keel initial assessment that the diffuse nature and other elements of this pain suggest there is a likely functional component.  While the duodenal ulcers were certainly significant, I cannot see how they fully explain this clinical picture.  Unable to tolerate GES due to vomiting, there may still be a probable delayed gastric emptying.  Plans were for empiric use of metoclopramide as indicated in yesterday's medical team note, though that does not appear to have been started.  I agree with the metoclopramide, but I would strongly caution against the use of opioids in this scenario.  I have also discontinue the dicyclomine as it is not likely to be helpful in my opinion and might worsen constipation.  Aspirin discontinued  Twice daily PPI continued  Await gastric biopsy results  If she does not have a bowel movement every day, start MiraLAX 17 g daily.  Advance diet as tolerated.  We will return to see this patient Monday -call sooner if urgent issues arise.  Nelida Meuse III Office: [phone]

## 2022-09-04 LAB — BASIC METABOLIC PANEL
Anion gap: 8 (ref 5–15)
BUN: 41 mg/dL — ABNORMAL HIGH (ref 8–23)
CO2: 21 mmol/L — ABNORMAL LOW (ref 22–32)
Calcium: 7.4 mg/dL — ABNORMAL LOW (ref 8.9–10.3)
Chloride: 106 mmol/L (ref 98–111)
Creatinine, Ser: 3.53 mg/dL — ABNORMAL HIGH (ref 0.44–1.00)
GFR, Estimated: 13 mL/min — ABNORMAL LOW (ref >60)
Glucose, Bld: 200 mg/dL — ABNORMAL HIGH (ref 70–99)
Potassium: 3.7 mmol/L (ref 3.5–5.1)
Sodium: 135 mmol/L (ref 135–145)

## 2022-09-04 LAB — GLUCOSE, CAPILLARY
Glucose-Capillary: 170 mg/dL — ABNORMAL HIGH (ref 70–99)
Glucose-Capillary: 200 mg/dL — ABNORMAL HIGH (ref 70–99)
Glucose-Capillary: 180 mg/dL — ABNORMAL HIGH (ref 70–99)
Glucose-Capillary: 153 mg/dL — ABNORMAL HIGH (ref 70–99)
Glucose-Capillary: 153 mg/dL — ABNORMAL HIGH (ref 70–99)

## 2022-09-04 MED ORDER — PHENOL 1.4 % MT LIQD: 1.0000 | OROMUCOSAL | Status: DC | PRN

## 2022-09-04 MED ORDER — LACTATED RINGERS IV BOLUS
1000.0000 mL | Freq: Once | INTRAVENOUS | Status: AC
  Administered 2022-09-04: 1000 mL via INTRAVENOUS

## 2022-09-04 MED ORDER — UNJURY CHICKEN SOUP POWDER
2.0000 [oz_av] | Freq: Once | ORAL | Status: AC
  Administered 2022-09-04: 2 [oz_av] via ORAL
  Filled 2022-09-04: qty 13.5

## 2022-09-04 NOTE — Plan of Care (Signed)
  Problem: Coping: Goal: Ability to adjust to condition or change in health will improve Outcome: Progressing   

## 2022-09-04 NOTE — Progress Notes (Addendum)
Subjective:   Summary: Alicia [name] is a 71 y.o. year old female currently admitted on the IMTS HD#5 for postprandial abdominal pain secondary to duodenal ulcers.  Overnight Events: NAEON   Pt was seen this AM bedside. She states that her pain is slightly better. She was able to tolerate some ginger ale but has not had much solid food.   Objective:  Vital signs in last 24 hours: Vitals:   09/03/22 1656 09/03/22 2116 09/04/22 0553 09/04/22 1010  BP: (!) 126/53 136/77 137/61 136/63  Pulse: 71 80 80 77  Resp: '18 16 18 18  '$ Temp: 97.6 F (36.4 C) (!) 97.4 F (36.3 C) 98.4 F (36.9 C) (!) 97.3 F (36.3 C)  TempSrc: Oral Oral Oral Oral  SpO2: 100% 99% 99%   Weight:      Height:       Supplemental O2: Room Air SpO2: 99 % O2 Flow Rate (L/min): 2 L/min   Physical Exam:  Constitutional: Elderly woman with low mood , in no acute distress Cardiovascular: RRR, no murmurs, rubs or gallops Pulmonary/Chest: normal work of breathing on room air, lungs clear to auscultation bilaterally Abdominal: Decreased bowel sounds, TTP in epigastrium Skin: warm and dry Extremities: upper/lower extremity pulses 2+, no lower extremity edema present  [facility] Weights   08/24/22 1519  Weight: 110 kg     Intake/Output Summary (Last 24 hours) at 09/04/2022 1144 Last data filed at 09/04/2022 0854 Gross per 24 hour  Intake 2111.07 ml  Output 1 ml  Net 2110.07 ml    Net IO Since Admission: 9,163.96 mL [09/04/22 1144]  Pertinent Labs:    Latest Ref Rng & Units 08/31/2022    1:32 AM 08/30/2022    3:32 AM 08/27/2022    4:46 AM  CBC  WBC 4.0 - 10.5 K/uL 6.2  6.7  7.4   Hemoglobin 12.0 - 15.0 g/dL 11.3  10.5  11.7   Hematocrit 36.0 - 46.0 % 34.2  31.4  33.8   Platelets 150 - 400 K/uL 264  227  229        Latest Ref Rng & Units 09/04/2022    3:01 AM 09/03/2022    9:19 AM 09/01/2022   10:59 AM  CMP  Glucose 70 - 99 mg/dL 200  135  116   BUN 8 - 23 mg/dL 41  42  35    Creatinine 0.44 - 1.00 mg/dL 3.53  3.25  2.49   Sodium 135 - 145 mmol/L 135  132  136   Potassium 3.5 - 5.1 mmol/L 3.7  4.2  4.2   Chloride 98 - 111 mmol/L 106  107  105   CO2 22 - 32 mmol/L '21  16  20   '$ Calcium 8.9 - 10.3 mg/dL 7.4  7.3  8.2      Assessment/Plan:   Principal Problem:   Generalized weakness Active Problems:   Pressure injury of skin   Nausea and vomiting   Diarrhea   Duodenal ulcer   Mucosal abnormality of stomach   Mucosal abnormality of esophagus   Patient Summary: Alicia [name] is a 71 y.o. with a pertinent PMH of HFpEF, HTN, DMII, CKD4, PAD, prior CVA, Vitamin D Deficiency, who presented with progressive weakness and post-prandial abdominal pain and admitted for decreased functional ability and postprandial abdominal pain, found to have duodenal ulcers.    #Postprandial abdominal pain, Secondary to  Duodenal Ulcers and Duodenitis Pt states her pain continues to improve in intensity. She does however note that she has not been able to eat much solid food. Thankfully her biopsy of the stomach and duodenum were negative for H Pylori.  KUB obtained yesterday showed little stool burden.   Will continue Dilaudid .'5mg'$ , IV Protonix '40mg'$  BID, Carafate Q6H. Per GI, she also may have gastroperesis and we will continue Reglan. Patient has had poor PO intake since admission, will consider GJ tube as patient continues to have very little nutrition.  Plan:  - Continue current medical regimen - Metoclopramide 5 mg TID - Appreciate GI Assistance - Will wean off Dilaudid if constipation is still present   #Acute kidney injury on CKD4 Vitamin D Deficiency  Anemia of CKD  Creatinine continues to rise due to poor oral fluid intake and loose stools. Will give IV fluids  #Chronic HFpEF #HTN Patient euvolemic to hypovolemic on exam. She is on Lasix 40 mg at home.  We will continue to monitor fluid status and determine requirement of the readdition of her Lasix.  In  the meanwhile we will continue amlodipine 10 mg and metoprolol 100 mg.   #Type II DM Continue sliding scale insulin, will monitor carefully as patient has had poor p.o. intake   Code: Full    Dispo: Anticipated discharge in less than five midnights.   Rick Duff, MD PGY-3 Internal Medicine  Pager [phone]  Please contact the on call pager after 5 pm and on weekends at [phone].

## 2022-09-05 ENCOUNTER — Encounter (HOSPITAL_BASED_OUTPATIENT_CLINIC_OR_DEPARTMENT_OTHER): Payer: Medicare HMO | Admitting: Pulmonary Disease

## 2022-09-05 ENCOUNTER — Inpatient Hospital Stay (HOSPITAL_COMMUNITY): Payer: Medicare HMO

## 2022-09-05 DIAGNOSIS — N179 Acute kidney failure, unspecified: Secondary | ICD-10-CM | POA: Diagnosis not present

## 2022-09-05 DIAGNOSIS — R1084 Generalized abdominal pain: Secondary | ICD-10-CM | POA: Diagnosis not present

## 2022-09-05 DIAGNOSIS — K269 Duodenal ulcer, unspecified as acute or chronic, without hemorrhage or perforation: Secondary | ICD-10-CM | POA: Diagnosis not present

## 2022-09-05 LAB — C DIFFICILE (CDIFF) QUICK SCRN (NO PCR REFLEX)
C Diff antigen: NEGATIVE
C Diff interpretation: NOT DETECTED
C Diff toxin: NEGATIVE

## 2022-09-05 LAB — GLUCOSE, CAPILLARY
Glucose-Capillary: 119 mg/dL — ABNORMAL HIGH (ref 70–99)
Glucose-Capillary: 121 mg/dL — ABNORMAL HIGH (ref 70–99)
Glucose-Capillary: 123 mg/dL — ABNORMAL HIGH (ref 70–99)
Glucose-Capillary: 109 mg/dL — ABNORMAL HIGH (ref 70–99)
Glucose-Capillary: 95 mg/dL (ref 70–99)

## 2022-09-05 LAB — BASIC METABOLIC PANEL
Anion gap: 9 (ref 5–15)
Anion gap: 5 (ref 5–15)
BUN: 42 mg/dL — ABNORMAL HIGH (ref 8–23)
BUN: 43 mg/dL — ABNORMAL HIGH (ref 8–23)
CO2: 20 mmol/L — ABNORMAL LOW (ref 22–32)
CO2: 21 mmol/L — ABNORMAL LOW (ref 22–32)
Calcium: 7.5 mg/dL — ABNORMAL LOW (ref 8.9–10.3)
Calcium: 7.1 mg/dL — ABNORMAL LOW (ref 8.9–10.3)
Chloride: 106 mmol/L (ref 98–111)
Chloride: 109 mmol/L (ref 98–111)
Creatinine, Ser: 3.8 mg/dL — ABNORMAL HIGH (ref 0.44–1.00)
Creatinine, Ser: 3.82 mg/dL — ABNORMAL HIGH (ref 0.44–1.00)
GFR, Estimated: 12 mL/min — ABNORMAL LOW (ref >60)
GFR, Estimated: 12 mL/min — ABNORMAL LOW (ref >60)
Glucose, Bld: 124 mg/dL — ABNORMAL HIGH (ref 70–99)
Glucose, Bld: 116 mg/dL — ABNORMAL HIGH (ref 70–99)
Potassium: 3.7 mmol/L (ref 3.5–5.1)
Potassium: 3.6 mmol/L (ref 3.5–5.1)
Sodium: 135 mmol/L (ref 135–145)
Sodium: 135 mmol/L (ref 135–145)

## 2022-09-05 MED ORDER — PHENYLEPHRINE-MINERAL OIL-PET 0.25-14-74.9 % RE OINT: 1.0000 | TOPICAL_OINTMENT | Freq: Two times a day (BID) | RECTAL | Status: DC | PRN

## 2022-09-05 MED ORDER — DULOXETINE HCL 30 MG PO CPEP
30.0000 mg | ORAL_CAPSULE | Freq: Two times a day (BID) | ORAL | Status: DC
  Administered 2022-09-05 – 2022-09-27 (×28): 30 mg via ORAL
  Filled 2022-09-05 (×37): qty 1

## 2022-09-05 MED ORDER — LACTATED RINGERS IV BOLUS
500.0000 mL | Freq: Once | INTRAVENOUS | Status: AC
  Administered 2022-09-05: 500 mL via INTRAVENOUS

## 2022-09-05 MED ORDER — DICYCLOMINE HCL 10 MG PO CAPS
10.0000 mg | ORAL_CAPSULE | Freq: Three times a day (TID) | ORAL | Status: DC
  Administered 2022-09-05 – 2022-09-07 (×6): 10 mg via ORAL
  Filled 2022-09-05 (×7): qty 1

## 2022-09-05 MED ORDER — LACTATED RINGERS IV SOLN: INTRAVENOUS | Status: AC

## 2022-09-05 NOTE — Progress Notes (Addendum)
Subjective:   Summary: Alicia [name] is a 71 y.o. year old female currently admitted on the IMTS HD#6 for postprandial abdominal pain secondary to duodenal ulcers.  Overnight Events: NOE   Pt was seen AM bedside. She states her pain is relatively the same. She is still not able to tolerate food, and every time she drinks liquids she is still having loose stools.   Objective:  Vital signs in last 24 hours: Vitals:   09/04/22 1631 09/05/22 0516 09/05/22 0911 09/05/22 1549  BP: (!) 134/58 (!) 144/76 (!) 140/63 134/80  Pulse: 71 85 78 82  Resp: '18 18 17 16  '$ Temp: 98.1 F (36.7 C) 98.4 F (36.9 C) 98.5 F (36.9 C) 98.1 F (36.7 C)  TempSrc: Oral Oral Oral Oral  SpO2: 100% 100% 98% 99%  Weight:      Height:       Supplemental O2: Room Air SpO2: 99 % O2 Flow Rate (L/min): 2 L/min   Physical Exam:  Constitutional: Elderly woman with low mood , in no acute distress Cardiovascular: RRR, no murmurs, rubs or gallops Pulmonary/Chest: normal work of breathing on room air, lungs clear to auscultation bilaterally Abdominal: Decreased bowel sounds, TTP in epigastrium Skin: warm and dry Extremities: upper/lower extremity pulses 2+, no lower extremity edema present  Filed Weights   08/24/22 1519  Weight: 110 kg     Intake/Output Summary (Last 24 hours) at 09/05/2022 1603 Last data filed at 09/05/2022 1000 Gross per 24 hour  Intake 240 ml  Output --  Net 240 ml   Net IO Since Admission: 9,403.96 mL [09/05/22 1603]  Pertinent Labs:    Latest Ref Rng & Units 08/31/2022    1:32 AM 08/30/2022    3:32 AM 08/27/2022    4:46 AM  CBC  WBC 4.0 - 10.5 K/uL 6.2  6.7  7.4   Hemoglobin 12.0 - 15.0 g/dL 11.3  10.5  11.7   Hematocrit 36.0 - 46.0 % 34.2  31.4  33.8   Platelets 150 - 400 K/uL 264  227  229        Latest Ref Rng & Units 09/05/2022    3:42 AM 09/04/2022    3:01 AM 09/03/2022    9:19 AM  CMP  Glucose 70 - 99 mg/dL 124  200  135   BUN 8 -  23 mg/dL 42  41  42   Creatinine 0.44 - 1.00 mg/dL 3.80  3.53  3.25   Sodium 135 - 145 mmol/L 135  135  132   Potassium 3.5 - 5.1 mmol/L 3.7  3.7  4.2   Chloride 98 - 111 mmol/L 106  106  107   CO2 22 - 32 mmol/L '20  21  16   '$ Calcium 8.9 - 10.3 mg/dL 7.5  7.4  7.3      Imaging: US RENAL  Result Date: 09/05/2022 CLINICAL DATA:  Acute kidney injury EXAM: RENAL / URINARY TRACT ULTRASOUND COMPLETE COMPARISON:  05/20/2022 renal ultrasound FINDINGS: Evaluation is limited due to body habitus. Right Kidney: Renal measurements: 8.6 x 4.0 x 5.4 cm = volume: 98 mL, previously 108 mL. Increased echogenicity. No mass or hydronephrosis visualized. Left Kidney: Renal measurements: 10.5 x 4.5 x 4.5 cm = volume: 112 mL, previously 130 mL. Increased echogenicity. No mass or hydronephrosis visualized. Bladder: Appears normal for degree of bladder distention. Bilateral ureteral jets are seen. Other: None.  IMPRESSION: Increased echogenicity of the bilateral kidneys as can be seen in medical renal disease. No hydronephrosis. Electronically Signed   By: Merilyn Baba M.D.   On: 09/05/2022 13:27     Assessment/Plan:   Principal Problem:   Generalized weakness Active Problems:   Pressure injury of skin   Nausea and vomiting   Diarrhea   Duodenal ulcer   Mucosal abnormality of stomach   Mucosal abnormality of esophagus   Patient Summary: Alicia [name] is a 71 y.o. with a pertinent PMH of HFpEF, HTN, DMII, CKD4, PAD, prior CVA, Vitamin D Deficiency, who presented with progressive weakness and post-prandial abdominal pain and admitted for decreased functional ability and postprandial abdominal pain, found to have duodenal ulcers.    #Postprandial Abdominal Pain, Secondary to Duodenal Ulcers and Duodenitis  Pt states her pain is relatively the same as it has been, any improvement is likely due to pain medications. As soon as meds run their course, pain returns. Biopsy of stomach and duodenum were  negative for H. Pylor. KUB yesterday showed little stool burden. We will continue dilaudid .'5mg'$ , IV Protonix '40mg'$  BID, Carafate Q6H, and Metoclopramide '5mg'$  TID   Currently trying to find the best way to get a feeding tube past the duodenal ulcer for this patient. IR does not do these in the hospital, and the only people who do do direct J-tubes are surgery. Will reach out to cortrack team tomorrow and will see if bypassing ulcers is an option.   #Acute kidney injury on CKD4 Vitamin D Deficiency  Anemia of CKD  Creatinine continues to rise due to poor oral fluid intake and loose stools. Will give IV fluids as necessary. Most recent Cr is 3.8. At this rate, she will be looking at dialysis in the not too distant future.  We have not had conversations about goals, future, possibility of HD, etc.    #Chronic HFpEF #HTN Patient euvolemic to hypovolemic on exam. She is on Lasix 40 mg at home, not required during minimal oral fluid intake.  In the meanwhile we will continue amlodipine 10 mg and metoprolol 100 mg.   #Type II DM Continue sliding scale insulin, will monitor carefully as patient has had poor p.o. intake  Code: Full Dispo: Anticipated discharge to in less than five midnights.   Drucie Opitz, MD PGY-1 Internal Medicine Resident Pager Number [phone] Please contact the on call pager after 5 pm and on weekends at [phone].

## 2022-09-05 NOTE — Progress Notes (Addendum)
Daily Rounding Note  09/05/2022, 10:16 AM  LOS: 6 days   SUBJECTIVE:   Chief complaint:   N/V, abd pain.     Last Dilaudid was on 10/14, only tylenol since then.   FL diet in place Still w loose, not bloody, stools, 4 to 6 in last 24 h and 2 this AM.  Pain in lower abdomen persists, N/V better.  Feels miserable.    OBJECTIVE:         Vital signs in last 24 hours:    Temp:  [98.1 F (36.7 C)-98.5 F (36.9 C)] 98.5 F (36.9 C) (10/16 0911) Pulse Rate:  [71-85] 78 (10/16 0911) Resp:  [17-18] 17 (10/16 0911) BP: (134-144)/(58-76) 140/63 (10/16 0911) SpO2:  [98 %-100 %] 98 % (10/16 0911) Last BM Date : 09/02/22 Filed Weights   08/24/22 1519  Weight: 110 kg   General: obese, looks unwell and lacking in energy.  comfortable   Heart: RRR Chest: no labored breathing or coug Abdomen: soft, tender wo guard/rebound in both R/L lower abdomen.  BS hypoactive  Extremities: LE edema.   Neuro/Psych:  appropriate, alert, oriented x 3  Intake/Output from previous day: No intake/output data recorded.  Intake/Output this shift: No intake/output data recorded.  Lab Results: No results for input(s): "WBC", "HGB", "HCT", "PLT" in the last 72 hours. BMET Recent Labs    09/03/22 0919 09/04/22 0301 09/05/22 0342  NA 132* 135 135  K 4.2 3.7 3.7  CL 107 106 106  CO2 16* 21* 20*  GLUCOSE 135* 200* 124*  BUN 42* 41* 42*  CREATININE 3.25* 3.53* 3.80*  CALCIUM 7.3* 7.4* 7.5*    Studies/Results: DG Abd 1 View  Result Date: 09/03/2022 CLINICAL DATA:  Constipation. Generalized weakness. EXAM: ABDOMEN - 1 VIEW COMPARISON:  09/2022 FINDINGS: Normal bowel gas pattern. No significant stool. Lower thoracic and lower lumbar spine degenerative changes. Mild scoliosis. IMPRESSION: No acute abnormality.  No significant stool. Electronically Signed   By: Claudie Revering M.D.   On: 09/03/2022 15:46    Scheduled Meds:  acetaminophen  1,000  mg Oral Q6H   Or   acetaminophen  650 mg Rectal Q6H   amLODipine  10 mg Oral Daily   diclofenac Sodium  4 g Topical QID   DULoxetine  30 mg Oral Daily   enoxaparin (LOVENOX) injection  40 mg Subcutaneous Q24H   feeding supplement  237 mL Oral BID BM   insulin aspart  0-6 Units Subcutaneous Q4H   metoCLOPramide (REGLAN) injection  10 mg Intravenous Q8H   metoprolol succinate  100 mg Oral Daily   multivitamin  1 tablet Oral QHS   pantoprazole (PROTONIX) IV  40 mg Intravenous BID   rosuvastatin  10 mg Oral QPM   sucralfate  1 g Oral Q6H   Continuous Infusions: PRN Meds:.HYDROmorphone (DILAUDID) injection, phenol   ASSESMENT:     Generalized abd pain. N/V.  Recurrent/progressive, chronic episodes. 08/24/22 CTAP wc: L colon tics, no diverticulitis.  Moderate periumbilical hernia contains fat/fluid. Several KUBs unremarkable there then moderate rectal stool burden on initial study 10/11 10/12 EGD:  2cm HH, nodular GEJ mucosa, biopsied.  Altered mucosa at antrum and prepylorus, biopsied.  Nonbleeding duodenal ulcers, clean-based, biopsied.  Duodenitis.  Recommend high-dose PPI, Carafate. Previously successful treatment of H. pylori in 2020. Path: Chronic duodenitis, gastric foveolar metaplasia suggesting peptic duodenitis on duodenal biopsies.  Gastric pathology showed nonspecific, reactive gastropathy.  No H. pylori.  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 No PPI or H2 blocker at home. Now on Protonic 40 IV bid, Reglan 10 mg IV q 8h, Carafate q 6h.      Diarrhea.  No colitis on CT.  09/02/22 stool PCR tox screen negative, no C diff testing.  Pancreatic fecal elastase testing in progress.  Reglan could be contributing to diarrhea.     PLAN   Not clear where to go from here.  Seems to have a strong functional component to her symptoms.  Given the diarrhea, will order testing for C. difficile.   Alicia [name]   09/05/2022, 10:16 AM Phone [phone]   Attending Physician Note   I have taken an interval history, reviewed the chart and examined the patient. I performed a substantive portion of this encounter, including complete performance of at least one of the key components, in conjunction with the APP. I agree with the APP's note, impression and recommendations with my edits. My additional impressions and recommendations are as follows.   *Duodenal ulcers, reactive gastropathy. H pylori negative. Continue pantoprazole 40 mg bid and Carafate 1g qid for now and as outpatient.   *Suspected functional abdominal pain and mild diarrhea.  Resume dicyclomine 10 mg ac & hs now and as outpatient. Advance diet as tolerated. Check C diff quick screen.   No further GI evaluation is planned at this time. GI signing off. Outpatient GI follow up with Dr. Tarri Glenn.   Lucio Edward, MD [facility] See [facility], [facility] [place], for our on call provider

## 2022-09-05 NOTE — Care Management Important Message (Signed)
Important Message  Patient Details  Name: Alicia [name] MRN: [id number] Date of Birth: 02-Aug-1951   Medicare Important Message Given:  Yes   Patient has a contact  precautions order in place will mail the IM to the patient home address.    Jammy Stlouis 09/05/2022, 3:34 PM

## 2022-09-06 ENCOUNTER — Encounter (HOSPITAL_COMMUNITY): Payer: Self-pay | Admitting: Gastroenterology

## 2022-09-06 ENCOUNTER — Inpatient Hospital Stay (HOSPITAL_COMMUNITY): Payer: Medicare HMO

## 2022-09-06 DIAGNOSIS — N179 Acute kidney failure, unspecified: Secondary | ICD-10-CM | POA: Diagnosis not present

## 2022-09-06 LAB — BASIC METABOLIC PANEL
Anion gap: 5 (ref 5–15)
BUN: 43 mg/dL — ABNORMAL HIGH (ref 8–23)
CO2: 21 mmol/L — ABNORMAL LOW (ref 22–32)
Calcium: 7.4 mg/dL — ABNORMAL LOW (ref 8.9–10.3)
Chloride: 109 mmol/L (ref 98–111)
Creatinine, Ser: 3.73 mg/dL — ABNORMAL HIGH (ref 0.44–1.00)
GFR, Estimated: 12 mL/min — ABNORMAL LOW (ref >60)
Glucose, Bld: 105 mg/dL — ABNORMAL HIGH (ref 70–99)
Potassium: 3.5 mmol/L (ref 3.5–5.1)
Sodium: 135 mmol/L (ref 135–145)

## 2022-09-06 LAB — GLUCOSE, CAPILLARY
Glucose-Capillary: 101 mg/dL — ABNORMAL HIGH (ref 70–99)
Glucose-Capillary: 104 mg/dL — ABNORMAL HIGH (ref 70–99)
Glucose-Capillary: 105 mg/dL — ABNORMAL HIGH (ref 70–99)
Glucose-Capillary: 94 mg/dL (ref 70–99)
Glucose-Capillary: 95 mg/dL (ref 70–99)
Glucose-Capillary: 96 mg/dL (ref 70–99)

## 2022-09-06 MED ORDER — DEXTROSE IN LACTATED RINGERS 5 % IV SOLN: INTRAVENOUS | Status: DC

## 2022-09-06 MED ORDER — THIAMINE HCL 100 MG/ML IJ SOLN
100.0000 mg | Freq: Every day | INTRAMUSCULAR | Status: DC
  Administered 2022-09-06 – 2022-09-07 (×2): 100 mg via INTRAVENOUS
  Filled 2022-09-06 (×2): qty 2

## 2022-09-06 MED ORDER — FOLIC ACID 1 MG PO TABS
1.0000 mg | ORAL_TABLET | Freq: Every day | ORAL | Status: DC
  Administered 2022-09-06 – 2022-09-07 (×2): 1 mg via ORAL
  Filled 2022-09-06 (×2): qty 1

## 2022-09-06 MED ORDER — ZINC SULFATE 220 (50 ZN) MG PO CAPS
220.0000 mg | ORAL_CAPSULE | Freq: Every day | ORAL | Status: DC
  Administered 2022-09-06 – 2022-09-07 (×2): 220 mg via ORAL
  Filled 2022-09-06 (×2): qty 1

## 2022-09-06 NOTE — Significant Event (Addendum)
Rapid Response Event Note   Reason for Call :  Concern for dysphagia and left facial droop  Initial Focused Assessment:  Pt lying in bed, awake. She appears drowsy. Disoriented to month. Mouth appears dry, giving the appearance of weakness, but pt is able to hold air in her cheeks and her tongue is midline. Facial symmetry improved after wetting pt's mouth. She follows commands. EOMI. No visual field loss. Bilateral upper and lower extremities strength are equal. Sensory intact. No aphasia. She is able to say "tip-top, fifty-fifty, and huckleberry" clearly. She does let out a breath at the end of each word, but speech is clear.  Full NIHSS documented in flowsheet.  She denies headache, visual change, or dizziness.   VS: T 98.105F, BP 145/78, HR 73, RR 18, SpO2 100% on room air CBG: 105  Pt received 0.'5mg'$  IV Dilaudid at 1302. This is her first administration since 09/03/2022.   LKW: 1300 Symptoms discovered: 1600  Interventions:  NIHSS CBG  Plan of Care:  -Reevaluate pt in 1 hour for symptom improvement- 1700 -Consider Narcan   Call rapid response for additional needs  Event Summary:  MD Notified: per RN Call Time: [place] Time: 1600 End Time: [place], RN

## 2022-09-06 NOTE — Progress Notes (Signed)
Physical Therapy Treatment Patient Details Name: Alicia [name] MRN: [id number] DOB: [birth year] Today's Date: 09/06/2022   History of Present Illness 71 y.o. female presented to ED 10/4 with FTT. PMhx: CHf, HTN, CVA, T2DM, CKD, vit D deficiency, PAD, depression, HFpEF    PT Comments    RN reports good time to work with pt as pain medication has just been administered. Once in room discussed pt not wanting to eat and pt reports too much abdominal pain. Pt is agreeable to work with therapy. When covers removed pt found to be incontinent of stool. Pt is min A for rolling for pericare. Once clean pt requires modAx2 for coming to sit EoB. Sat for approximately 5 minutes when noted to be very sleepy. Pt agreeable to take step towards HoB before getting back to bed and attempts to start power up before reporting she is too fatigued. Pt requires modAx2 for return to be and is asleep before therapy leaves room. D/c plan remains appropriate. PT will continue to follow acutely.    Recommendations for follow up therapy are one component of a multi-disciplinary discharge planning process, led by the attending physician.  Recommendations may be updated based on patient status, additional functional criteria and insurance authorization.  Follow Up Recommendations  Skilled nursing-short term rehab (<3 hours/day) Can patient physically be transported by private vehicle: No   Assistance Recommended at Discharge Frequent or constant Supervision/Assistance  Patient can return home with the following A little help with walking and/or transfers;Assistance with cooking/housework;Direct supervision/assist for medications management;Assist for transportation;A lot of help with bathing/dressing/bathroom   Equipment Recommendations  None recommended by PT       Precautions / Restrictions Precautions Precautions: Fall Restrictions Weight Bearing Restrictions: No     Mobility  Bed Mobility Overal bed  mobility: Needs Assistance Bed Mobility: Rolling, Supine to Sit, Sit to Supine Rolling: Min assist   Supine to sit: Mod assist, +2 for physical assistance Sit to supine: Mod assist, +2 for physical assistance   General bed mobility comments: required assistance with BLE off bed and to raise trunk    Transfers Overall transfer level: Needs assistance Equipment used: None Transfers: Sit to/from Stand             General transfer comment: attempted to stand from EOB and patient was unable t clear bed          Balance Overall balance assessment: Needs assistance Sitting-balance support: No upper extremity supported, Feet supported Sitting balance-Leahy Scale: Fair Sitting balance - Comments: min guard to supervision but required more assistance at end of session due to increased lethargy Postural control: Posterior lean                                  Cognition Arousal/Alertness: Lethargic (became more lethargic at end of session possible due to pain medication) Behavior During Therapy: Flat affect Overall Cognitive Status: Impaired/Different from baseline Area of Impairment: Following commands, Safety/judgement, Problem solving                       Following Commands: Follows one step commands inconsistently, Follows one step commands with increased time Safety/Judgement: Decreased awareness of safety, Decreased awareness of deficits   Problem Solving: Slow processing, Requires verbal cues             General Comments General comments (skin integrity, edema, etc.): pt continues to refuse eating citing  increased abdominal pain and increased stooling      Pertinent Vitals/Pain Pain Assessment Pain Assessment: Faces Faces Pain Scale: Hurts little more Pain Location: generalized to her stomach Pain Descriptors / Indicators: Tightness Pain Intervention(s): Monitored during session, Repositioned, Premedicated before session     PT Goals  (current goals can now be found in the care plan section) Acute Rehab PT Goals PT Goal Formulation: With patient Time For Goal Achievement: 09/08/22 Potential to Achieve Goals: Fair Progress towards PT goals: Not progressing toward goals - comment    Frequency    Min 2X/week      PT Plan Current plan remains appropriate    Co-evaluation PT/OT/SLP Co-Evaluation/Treatment: Yes Reason for Co-Treatment: For patient/therapist safety PT goals addressed during session: Mobility/safety with mobility OT goals addressed during session: ADL's and self-care      AM-PAC PT "6 Clicks" Mobility   Outcome Measure  Help needed turning from your back to your side while in a flat bed without using bedrails?: A Little Help needed moving from lying on your back to sitting on the side of a flat bed without using bedrails?: Total Help needed moving to and from a bed to a chair (including a wheelchair)?: Total Help needed standing up from a chair using your arms (e.g., wheelchair or bedside chair)?: Total Help needed to walk in hospital room?: Total Help needed climbing 3-5 steps with a railing? : Total 6 Click Score: 8    End of Session Equipment Utilized During Treatment: Gait belt Activity Tolerance: Patient tolerated treatment well Patient left: in bed;with call bell/phone within reach;with nursing/sitter in room Nurse Communication: Mobility status PT Visit Diagnosis: Other abnormalities of gait and mobility (R26.89);History of falling (Z91.81);Muscle weakness (generalized) (M62.81)     Time: 3662-9476 PT Time Calculation (min) (ACUTE ONLY): 29 min  Charges:  $Therapeutic Activity: 8-22 mins                     Giannah Zavadil B. Migdalia Dk PT, DPT Acute Rehabilitation Services Please use secure chat or  Call Office [phone]    [place] 09/06/2022, 2:55 PM

## 2022-09-06 NOTE — Progress Notes (Signed)
Nutrition Follow-up  DOCUMENTATION CODES:   Obesity unspecified  INTERVENTION:   Plan for Cortrak tomorrow  Tube Feeding via Cortrak:  Osmolite 1.2 at 60 ml/hr Initiate at 20 ml/hr; titrate by 10 mL q 8 hours until goal rate of 60 ml/hr This provides 1728 kcals, 80 g of protein, 912 mL of free water  Monitor magnesium, potassium, and phosphorus BID for at least 3 days, MD to replete as needed, as pt is at risk for refeeding syndrome given prolonged inadequate oral intake.  Continue Renal MVI daily Add thiamine 100 mg daily x 5 days and folic acid 1 mg daily for refeeding risk. Plan to also order zinc 220 mg as pt is at risk for deficiency and could be contributing to some of patients symptoms.    NUTRITION DIAGNOSIS:   Inadequate oral intake related to inability to eat, nausea as evidenced by meal completion < 50%.  Being addressed via TF  GOAL:   Patient will meet greater than or equal to 90% of their needs  Not Met but being addressed  MONITOR:   PO intake, Supplement acceptance  REASON FOR ASSESSMENT:   Consult Assessment of nutrition requirement/status  ASSESSMENT:   71 y.o. female admits related to weakness. PMH includes: HTN, DM, CKD4, PAD. Pt is currently receiving medical management for weakness and abdominal pain.  10/12 EGD: duodenitis with duodenal ulcers, nodular mucosa and GEJ, altered mucosa in antrum and prepyloric region of stomach. Biopsies negative for H. Pyloric  Noted order for Cortrak placement  On visit today, pt arousable but very sleeping, nods off easily. Discussed with RN.   Currently on FL diet. Pt is not eating anything, not taking ensure. Recorded po intake 0% of meals  Pt with loose, non-bloody stools 4-6 times per day. +abd pain C.diff negative. RN reports today that stool is less frequent but remains loose.   No weight since 10/04; requested new weight today  Labs: reviewed Meds: bentyl, rena-vite, carafate, ss  novolog   Diet Order:   Diet Order             Diet full liquid Room service appropriate? Yes; Fluid consistency: Thin  Diet effective now                   EDUCATION NEEDS:   Not appropriate for education at this time  Skin:  Skin Assessment: Skin Integrity Issues: Skin Integrity Issues:: Stage I Stage I: Medial sacrum  Last BM:  08/22/22 per pt  Height:   Ht Readings from Last 1 Encounters:  08/24/22 _0  (1.727 m)    Weight:   Wt Readings from Last 1 Encounters:  08/24/22 110 kg    Ideal Body Weight:  63.6 kg  BMI:  Body mass index is 36.87 kg/m.  Estimated Nutritional Needs:   Kcal:  1700-1900 kcals  Protein:  80-95 gm  Fluid:  >/= 1.7 L   Kerman Passey MS, RDN, LDN, CNSC Registered Dietitian 3 Clinical Nutrition RD Pager and On-Call Pager Number Located in [place]

## 2022-09-06 NOTE — Progress Notes (Signed)
Occupational Therapy Treatment Patient Details Name: Alicia [name] MRN: [id number] DOB: 1950/12/06 Today's Date: 09/06/2022   History of present illness 71 y.o. female presented to ED 10/4 with FTT. PMhx: CHf, HTN, CVA, T2DM, CKD, vit D deficiency, PAD, depression, HFpEF   OT comments  Patient received in supine and agreeable to OT/PT session. Patient rolled side to side in bed with min assist for cleaning peri area and bottom after patient had soiled bed. Patient required mod assist to get to EOB due to assistance needed with trunk and and getting BLE off EOB. Patient sat on EOB and performed grooming and sitting balance tasks with min guard to supervision with patient becoming more lethargic possible due to medication. Patient attempted to stand from EOB to side step towards [facility] but was unable to clear bed and was assisted back to supine with mod assist +2. Patient has not demonstrated significant gains and is limited by pain and weakness. Acute OT to continue to follow.   Recommendations for follow up therapy are one component of a multi-disciplinary discharge planning process, led by the attending physician.  Recommendations may be updated based on patient status, additional functional criteria and insurance authorization.    Follow Up Recommendations  Skilled nursing-short term rehab (<3 hours/day)    Assistance Recommended at Discharge Frequent or constant Supervision/Assistance  Patient can return home with the following  Help with stairs or ramp for entrance;Assist for transportation;Assistance with cooking/housework;A lot of help with bathing/dressing/bathroom;A lot of help with walking and/or transfers   Equipment Recommendations  None recommended by OT    Recommendations for Other Services      Precautions / Restrictions Precautions Precautions: Fall Restrictions Weight Bearing Restrictions: No       Mobility Bed Mobility Overal bed mobility: Needs Assistance Bed  Mobility: Rolling, Supine to Sit, Sit to Supine Rolling: Min assist   Supine to sit: Mod assist, +2 for physical assistance Sit to supine: Mod assist, +2 for physical assistance   General bed mobility comments: required assistance with BLE off bed and to raise trunk    Transfers Overall transfer level: Needs assistance Equipment used: None Transfers: Sit to/from Stand             General transfer comment: attempted to stand from EOB and patient was unable t clear bed     Balance Overall balance assessment: Needs assistance Sitting-balance support: No upper extremity supported, Feet supported Sitting balance-Leahy Scale: Fair Sitting balance - Comments: min guard to supervision but required more assistance at end of session due to increased lethargy Postural control: Posterior lean                                 ADL either performed or assessed with clinical judgement   ADL Overall ADL's : Needs assistance/impaired     Grooming: Wash/dry hands;Wash/dry face;Sitting;Min guard Grooming Details (indicate cue type and reason): on EOB     Lower Body Bathing: Maximal assistance;Bed level Lower Body Bathing Details (indicate cue type and reason): patient was assisted with cleaning following BM in bed with patient assisting with rolling side to side                            Extremity/Trunk Assessment Upper Extremity Assessment RUE Deficits / Details: chronic R shoulder dysfunction RUE Sensation: WNL RUE Coordination: WNL  Vision       Perception     Praxis      Cognition Arousal/Alertness: Lethargic (became more lethargic at end of session possible due to pain medication) Behavior During Therapy: Flat affect Overall Cognitive Status: Impaired/Different from baseline Area of Impairment: Following commands, Safety/judgement, Problem solving                       Following Commands: Follows one step commands  inconsistently, Follows one step commands with increased time Safety/Judgement: Decreased awareness of safety, Decreased awareness of deficits   Problem Solving: Slow processing, Requires verbal cues          Exercises      Shoulder Instructions       General Comments      Pertinent Vitals/ Pain       Pain Assessment Pain Assessment: Faces Faces Pain Scale: Hurts little more Pain Location: generalized to her stomach Pain Descriptors / Indicators: Tightness Pain Intervention(s): Monitored during session, Limited activity within patient's tolerance, Premedicated before session  Home Living                                          Prior Functioning/Environment              Frequency  Min 2X/week        Progress Toward Goals  OT Goals(current goals can now be found in the care plan section)  Progress towards OT goals: Not progressing toward goals - comment (due to abdominal pain  and increased weakness)  Acute Rehab OT Goals Patient Stated Goal: feel better OT Goal Formulation: With patient Time For Goal Achievement: 09/08/22 Potential to Achieve Goals: Fair ADL Goals Pt Will Perform Grooming: with supervision;standing Pt Will Perform Lower Body Dressing: with supervision;sit to/from stand Pt Will Transfer to Toilet: with supervision;stand pivot transfer;bedside commode Pt/caregiver will Perform Home Exercise Program: Increased strength;Both right and left upper extremity;With theraband;With Supervision  Plan Discharge plan remains appropriate    Co-evaluation    PT/OT/SLP Co-Evaluation/Treatment: Yes Reason for Co-Treatment: For patient/therapist safety;To address functional/ADL transfers   OT goals addressed during session: ADL's and self-care      AM-PAC OT "6 Clicks" Daily Activity     Outcome Measure   Help from another person eating meals?: None Help from another person taking care of personal grooming?: A Little Help from  another person toileting, which includes using toliet, bedpan, or urinal?: A Lot Help from another person bathing (including washing, rinsing, drying)?: A Lot Help from another person to put on and taking off regular upper body clothing?: A Little Help from another person to put on and taking off regular lower body clothing?: Total 6 Click Score: 15    End of Session Equipment Utilized During Treatment: Gait belt  OT Visit Diagnosis: Unsteadiness on feet (R26.81);Muscle weakness (generalized) (M62.81);History of falling (Z91.81)   Activity Tolerance Patient limited by lethargy;Patient limited by pain   Patient Left in bed;with call bell/phone within reach;with bed alarm set   Nurse Communication Mobility status        Time: 2956-2130 OT Time Calculation (min): 29 min  Charges: OT General Charges $OT Visit: 1 Visit OT Treatments $Self Care/Home Management : 8-22 mins  Lodema Hong, [place]  Office [place] 09/06/2022, 2:33 PM

## 2022-09-06 NOTE — Progress Notes (Signed)
Subjective:   Summary: Alicia [name] is a 71 y.o. year old female currently admitted on the IMTS HD#7 for postprandial abdominal pain secondary to duodenal ulcers and duodenitis.  Overnight Events:   Pt was seen this AM bedside. Her mood and affect have not changed since admission. She was requesting to look for a phone to call her daughter, was able to supply her with it.     Objective:  Vital signs in last 24 hours: Vitals:   09/05/22 1549 09/05/22 2049 09/06/22 0517 09/06/22 0837  BP: 134/80 (!) 150/64 138/78 (!) 145/88  Pulse: 82 77 80 87  Resp: '16 18 18 15  '$ Temp: 98.1 F (36.7 C) 98.6 F (37 C) 98.4 F (36.9 C) 98.1 F (36.7 C)  TempSrc: Oral Oral Oral Oral  SpO2: 99% 98% 100% 97%  Weight:      Height:       Supplemental O2: Room Air SpO2: 97 % O2 Flow Rate (L/min): 2 L/min   Physical Exam:  Constitutional: Elderly obese woman with very low mood, in no acute distress Cardiovascular: RRR, no murmurs, rubs or gallops Pulmonary/Chest: normal work of breathing on room air, lungs clear to auscultation bilaterally Abdominal: Decreased bowel sound, TTP in throughout abdomen Skin: warm and dry Extremities: No lower extremity edema present Filed Weights   08/24/22 1519  Weight: 110 kg     Intake/Output Summary (Last 24 hours) at 09/06/2022 0840 Last data filed at 09/06/2022 5573 Gross per 24 hour  Intake 480 ml  Output 0 ml  Net 480 ml   Net IO Since Admission: 9,643.96 mL [09/06/22 0840]  Pertinent Labs:    Latest Ref Rng & Units 08/31/2022    1:32 AM 08/30/2022    3:32 AM 08/27/2022    4:46 AM  CBC  WBC 4.0 - 10.5 K/uL 6.2  6.7  7.4   Hemoglobin 12.0 - 15.0 g/dL 11.3  10.5  11.7   Hematocrit 36.0 - 46.0 % 34.2  31.4  33.8   Platelets 150 - 400 K/uL 264  227  229        Latest Ref Rng & Units 09/06/2022    2:53 AM 09/05/2022    3:55 PM 09/05/2022    3:42 AM  CMP  Glucose 70 - 99 mg/dL 105  116  124   BUN 8 - 23 mg/dL 43   43  42   Creatinine 0.44 - 1.00 mg/dL 3.73  3.82  3.80   Sodium 135 - 145 mmol/L 135  135  135   Potassium 3.5 - 5.1 mmol/L 3.5  3.6  3.7   Chloride 98 - 111 mmol/L 109  109  106   CO2 22 - 32 mmol/L '21  21  20   '$ Calcium 8.9 - 10.3 mg/dL 7.4  7.1  7.5      Imaging: US RENAL  Result Date: 09/05/2022 CLINICAL DATA:  Acute kidney injury EXAM: RENAL / URINARY TRACT ULTRASOUND COMPLETE COMPARISON:  05/20/2022 renal ultrasound FINDINGS: Evaluation is limited due to body habitus. Right Kidney: Renal measurements: 8.6 x 4.0 x 5.4 cm = volume: 98 mL, previously 108 mL. Increased echogenicity. No mass or hydronephrosis visualized. Left Kidney: Renal measurements: 10.5 x 4.5 x 4.5 cm = volume: 112 mL, previously 130 mL. Increased echogenicity. No mass or hydronephrosis visualized. Bladder: Appears normal for degree of bladder distention. Bilateral ureteral jets are seen. Other:  None. IMPRESSION: Increased echogenicity of the bilateral kidneys as can be seen in medical renal disease. No hydronephrosis. Electronically Signed   By: Merilyn Baba M.D.   On: 09/05/2022 13:27    Assessment/Plan:   Principal Problem:   Generalized weakness Active Problems:   Pressure injury of skin   Nausea and vomiting   Diarrhea   Duodenal ulcer   Mucosal abnormality of stomach   Mucosal abnormality of esophagus   Patient Summary: Alicia [name] is a 71 y.o. with a pertinent PMH of HFpEF, Htn, DMII, CKD4, PAD, prior CVA, Vitamin D Deficiency, who presented with progressive weakness and post-prandial abdominal pain and admitted for decreased functional ability and postprandial abdominal pain, found to have severe duodenal ulcers.    #Postprandial Abdominal Pain, Secondary to Duodenal Ulcers and Duodenitis  #Malnourishment Pt states her pain is relatively the same as it is been. She is in the same pain, and still has not eaten anything besides a few sips of water. Biopsy of stomach and duodenum were negative  for H. Pylori. KUB showed a little stool burden. We will continue Dilaudid .'5mg'$ , IV Protonix '40mg'$  BID, Carafate Q6H, and Metoclopramide '5mg'$  TID.  Still finding the best way to get patient nutrition. Cortrack team is only available on MWF, so will call them tomorrow and hopefully they will be able to evaluate patient and provide recommendations on next step. If that does not work, next step would be PEG tube via surgery evaluation.   #AKI on CKD 4  #Vitman D Deficiency  #Anemia of CKD  Creatinine downtrended slightly at 3.73 from 3.8 yesterday. Renal ultrasound was done to evaluate for any other causes of bump in creatinine, which revealed no obstructive processes. We have started patient on maintenance fluids of LR, and will continue to monitor creatinine. Depending on how worse kidney function gets, she may need dialysis in the future.   #Chronic HFpEF #HTN Patient euvolemic on exam. Her home regimen includes Lasix '40mg'$  at home, not required during minimal oral fluidintake. In the meanwhile we will continue amlodipine '10mg'$  and metoprolol 100 mg  #Type II DM Continue sliding scale insulin, will monitor carefully as patient has had poor PO intake.   Code: Full    Dispo: Anticipated discharge in less than five midnights.  Drucie Opitz, MD PGY-1 Internal Medicine Resident Pager Number [phone] Please contact the on call pager after 5 pm and on weekends at [phone].

## 2022-09-07 ENCOUNTER — Inpatient Hospital Stay (HOSPITAL_COMMUNITY): Payer: Medicare HMO

## 2022-09-07 DIAGNOSIS — Z4682 Encounter for fitting and adjustment of non-vascular catheter: Secondary | ICD-10-CM | POA: Diagnosis not present

## 2022-09-07 DIAGNOSIS — R109 Unspecified abdominal pain: Secondary | ICD-10-CM | POA: Diagnosis not present

## 2022-09-07 DIAGNOSIS — R531 Weakness: Secondary | ICD-10-CM | POA: Diagnosis not present

## 2022-09-07 LAB — PHOSPHORUS: Phosphorus: 2.2 mg/dL — ABNORMAL LOW (ref 2.5–4.6)

## 2022-09-07 LAB — URINALYSIS, COMPLETE (UACMP) WITH MICROSCOPIC
Bilirubin Urine: NEGATIVE
Glucose, UA: NEGATIVE mg/dL
Hgb urine dipstick: NEGATIVE
Ketones, ur: NEGATIVE mg/dL
Leukocytes,Ua: NEGATIVE
Nitrite: NEGATIVE
Protein, ur: 30 mg/dL — AB
Specific Gravity, Urine: 1.018 (ref 1.005–1.030)
pH: 5 (ref 5.0–8.0)

## 2022-09-07 LAB — GLUCOSE, CAPILLARY
Glucose-Capillary: 127 mg/dL — ABNORMAL HIGH (ref 70–99)
Glucose-Capillary: 128 mg/dL — ABNORMAL HIGH (ref 70–99)
Glucose-Capillary: 128 mg/dL — ABNORMAL HIGH (ref 70–99)
Glucose-Capillary: 139 mg/dL — ABNORMAL HIGH (ref 70–99)
Glucose-Capillary: 141 mg/dL — ABNORMAL HIGH (ref 70–99)
Glucose-Capillary: 156 mg/dL — ABNORMAL HIGH (ref 70–99)
Glucose-Capillary: 156 mg/dL — ABNORMAL HIGH (ref 70–99)

## 2022-09-07 LAB — BASIC METABOLIC PANEL
Anion gap: 11 (ref 5–15)
BUN: 45 mg/dL — ABNORMAL HIGH (ref 8–23)
CO2: 20 mmol/L — ABNORMAL LOW (ref 22–32)
Calcium: 7.4 mg/dL — ABNORMAL LOW (ref 8.9–10.3)
Chloride: 107 mmol/L (ref 98–111)
Creatinine, Ser: 4.06 mg/dL — ABNORMAL HIGH (ref 0.44–1.00)
GFR, Estimated: 11 mL/min — ABNORMAL LOW (ref >60)
Glucose, Bld: 147 mg/dL — ABNORMAL HIGH (ref 70–99)
Potassium: 3.8 mmol/L (ref 3.5–5.1)
Sodium: 138 mmol/L (ref 135–145)

## 2022-09-07 LAB — MAGNESIUM: Magnesium: 1.9 mg/dL (ref 1.7–2.4)

## 2022-09-07 MED ORDER — THIAMINE MONONITRATE 100 MG PO TABS
100.0000 mg | ORAL_TABLET | Freq: Every day | ORAL | Status: DC
  Administered 2022-09-08 – 2022-10-01 (×21): 100 mg
  Filled 2022-09-07 (×21): qty 1

## 2022-09-07 MED ORDER — ZINC SULFATE 220 (50 ZN) MG PO CAPS
220.0000 mg | ORAL_CAPSULE | Freq: Every day | ORAL | Status: AC
  Administered 2022-09-08 – 2022-09-19 (×12): 220 mg
  Filled 2022-09-07 (×12): qty 1

## 2022-09-07 MED ORDER — METOPROLOL TARTRATE 50 MG PO TABS
50.0000 mg | ORAL_TABLET | Freq: Two times a day (BID) | ORAL | Status: DC
  Administered 2022-09-07 – 2022-09-20 (×26): 50 mg
  Filled 2022-09-07 (×27): qty 1

## 2022-09-07 MED ORDER — OSMOLITE 1.2 CAL PO LIQD
1000.0000 mL | ORAL | Status: DC
  Filled 2022-09-07: qty 1000

## 2022-09-07 MED ORDER — PANTOPRAZOLE 2 MG/ML SUSPENSION
40.0000 mg | Freq: Two times a day (BID) | ORAL | Status: DC
  Filled 2022-09-07: qty 20

## 2022-09-07 MED ORDER — ACETAMINOPHEN 650 MG RE SUPP
650.0000 mg | Freq: Four times a day (QID) | RECTAL | Status: DC
  Filled 2022-09-07 (×2): qty 1

## 2022-09-07 MED ORDER — PANTOPRAZOLE SODIUM 40 MG PO TBEC: 40.0000 mg | DELAYED_RELEASE_TABLET | Freq: Two times a day (BID) | ORAL | Status: DC

## 2022-09-07 MED ORDER — SUCRALFATE 1 GM/10ML PO SUSP
1.0000 g | Freq: Four times a day (QID) | ORAL | Status: DC
  Administered 2022-09-07 – 2022-09-20 (×50): 1 g
  Filled 2022-09-07 (×51): qty 10

## 2022-09-07 MED ORDER — ADULT MULTIVITAMIN LIQUID CH
15.0000 mL | Freq: Every day | ORAL | Status: DC
  Administered 2022-09-08 – 2022-09-16 (×9): 15 mL
  Filled 2022-09-07 (×9): qty 15

## 2022-09-07 MED ORDER — OSMOLITE 1.2 CAL PO LIQD
1000.0000 mL | ORAL | Status: DC
  Administered 2022-09-07 – 2022-09-11 (×4): 1000 mL
  Filled 2022-09-07 (×6): qty 1000

## 2022-09-07 MED ORDER — ACETAMINOPHEN 160 MG/5ML PO SOLN
1000.0000 mg | Freq: Four times a day (QID) | ORAL | Status: DC
  Administered 2022-09-07 – 2022-09-20 (×46): 1000 mg
  Filled 2022-09-07 (×51): qty 40.6

## 2022-09-07 MED ORDER — ROSUVASTATIN CALCIUM 5 MG PO TABS
10.0000 mg | ORAL_TABLET | Freq: Every evening | ORAL | Status: DC
  Administered 2022-09-08 – 2022-09-30 (×19): 10 mg
  Filled 2022-09-07 (×22): qty 2

## 2022-09-07 MED ORDER — FOLIC ACID 1 MG PO TABS
1.0000 mg | ORAL_TABLET | Freq: Every day | ORAL | Status: DC
  Administered 2022-09-08 – 2022-09-20 (×13): 1 mg
  Filled 2022-09-07 (×13): qty 1

## 2022-09-07 MED ORDER — METOPROLOL TARTRATE 50 MG PO TABS: 50.0000 mg | ORAL_TABLET | Freq: Two times a day (BID) | ORAL | Status: DC

## 2022-09-07 MED ORDER — THIAMINE MONONITRATE 100 MG PO TABS: 100.0000 mg | ORAL_TABLET | Freq: Every day | ORAL | Status: DC

## 2022-09-07 MED ORDER — AMLODIPINE BESYLATE 10 MG PO TABS
10.0000 mg | ORAL_TABLET | Freq: Every day | ORAL | Status: DC
  Administered 2022-09-08 – 2022-09-20 (×13): 10 mg
  Filled 2022-09-07 (×14): qty 1

## 2022-09-07 MED ORDER — LACTATED RINGERS IV SOLN: INTRAVENOUS | Status: DC

## 2022-09-07 MED ORDER — DICYCLOMINE HCL 10 MG PO CAPS
10.0000 mg | ORAL_CAPSULE | Freq: Three times a day (TID) | ORAL | Status: DC
  Administered 2022-09-07 – 2022-09-20 (×46): 10 mg
  Filled 2022-09-07 (×47): qty 1

## 2022-09-07 MED ORDER — PANTOPRAZOLE 2 MG/ML SUSPENSION
40.0000 mg | Freq: Two times a day (BID) | ORAL | Status: DC
  Administered 2022-09-07 – 2022-09-20 (×25): 40 mg
  Filled 2022-09-07 (×32): qty 20

## 2022-09-07 NOTE — Progress Notes (Signed)
Cortrak Tube Team Note:  Cortrak placed earlier today was read as being in the 2nd or 3rd portion of the duodenum. Medical team asked that the tube be advanced to the LOT. Cortrak tube advanced to 96 cm and bridled in the L nare.   X-ray is required, abdominal x-ray has been ordered by the Cortrak team. Please confirm tube placement before using the Cortrak tube.   If the tube becomes dislodged please keep the tube and contact the Cortrak team at [email or web address] for replacement.  If after hours and replacement cannot be delayed, place a NG tube and confirm placement with an abdominal x-ray.    Lockie Pares., RD, LDN, CNSC See AMiON for contact information

## 2022-09-07 NOTE — Procedures (Signed)
Cortrak  Person Inserting Tube:  Alicia [name], RD Tube Type:  Cortrak - 43 inches Tube Size:  10 Tube Location:  Left nare Secured by: Bridle Technique Used to Measure Tube Placement:  Marking at nare/corner of mouth Cortrak Secured At:  87 cm   Cortrak Tube Team Note:  Consult received to place a Cortrak feeding tube.   X-ray is required, abdominal x-ray has been ordered by the Cortrak team. Please confirm tube placement before using the Cortrak tube.   If the tube becomes dislodged please keep the tube and contact the Cortrak team at [email or web address] for replacement.  If after hours and replacement cannot be delayed, place a NG tube and confirm placement with an abdominal x-ray.    Lockie Pares., RD, LDN, CNSC See AMiON for contact information

## 2022-09-07 NOTE — Progress Notes (Signed)
Subjective:   Summary: Alicia [name] is a 71 y.o. year old female currently admitted on the IMTS HD#8 for diffuse abdominal pain of unknown etiology, as well as severely decreased PO intake.  Overnight Events: NOE   Pt was seen this AM bedside. Her mood and affect have not changed since admission. She is still responsive, able to hold conversation, and expressed understanding of current situation.   Objective:  Vital signs in last 24 hours: Vitals:   09/06/22 1559 09/06/22 2037 09/07/22 0404 09/07/22 1041  BP: (!) 145/78 136/70 136/68 (!) 146/62  Pulse: 72 74 74 74  Resp: '18 16 18 14  '$ Temp: 98.5 F (36.9 C) 97.8 F (36.6 C) 98.9 F (37.2 C) 98.1 F (36.7 C)  TempSrc: Oral Oral  Oral  SpO2: 100% 100% 100% 97%  Weight:      Height:       Supplemental O2: Room Air SpO2: 97 % O2 Flow Rate (L/min): 2 L/min   Physical Exam:  Constitutional: Elderly, obese woman with very low mood, in no acute distress HEENT: Feeding tube in place Cardiovascular: RRR, no murmurs, rubs or gallops Pulmonary/Chest: normal work of breathing on room air, lungs clear to auscultation bilaterally Abdominal: Decreased bowel sounds, TTP in throughout abdomen  Skin: warm and dry Extremities: No lower extremity edema present  Filed Weights   08/24/22 1519  Weight: 110 kg     Intake/Output Summary (Last 24 hours) at 09/07/2022 1543 Last data filed at 09/07/2022 1200 Gross per 24 hour  Intake 753.91 ml  Output 0 ml  Net 753.91 ml   Net IO Since Admission: 10,417.87 mL [09/07/22 1543]  Pertinent Labs:    Latest Ref Rng & Units 08/31/2022    1:32 AM 08/30/2022    3:32 AM 08/27/2022    4:46 AM  CBC  WBC 4.0 - 10.5 K/uL 6.2  6.7  7.4   Hemoglobin 12.0 - 15.0 g/dL 11.3  10.5  11.7   Hematocrit 36.0 - 46.0 % 34.2  31.4  33.8   Platelets 150 - 400 K/uL 264  227  229        Latest Ref Rng & Units 09/07/2022    6:02 AM 09/06/2022    2:53 AM 09/05/2022    3:55 PM   CMP  Glucose 70 - 99 mg/dL 147  105  116   BUN 8 - 23 mg/dL 45  43  43   Creatinine 0.44 - 1.00 mg/dL 4.06  3.73  3.82   Sodium 135 - 145 mmol/L 138  135  135   Potassium 3.5 - 5.1 mmol/L 3.8  3.5  3.6   Chloride 98 - 111 mmol/L 107  109  109   CO2 22 - 32 mmol/L '20  21  21   '$ Calcium 8.9 - 10.3 mg/dL 7.4  7.4  7.1       Imaging: DG Abd Portable 1V  Result Date: 09/07/2022 CLINICAL DATA:  Feeding tube placement EXAM: PORTABLE ABDOMEN - 1 VIEW COMPARISON:  09/07/2022 FINDINGS: Tip of feeding tube is noted in the region of fourth portion of duodenum. Bowel gas pattern is nonspecific. Lower abdomen and pelvis are not included. Transverse diameter of heart is increased. Small left pleural effusion is seen. IMPRESSION: Tip of feeding tube is seen in the region of fourth portion of duodenum. Electronically Signed   By: Prudy Feeler.D.  On: 09/07/2022 14:11   DG Abd Portable 1V  Result Date: 09/07/2022 CLINICAL DATA:  Feeding tube placement. EXAM: PORTABLE ABDOMEN - 1 VIEW COMPARISON:  September 03, 2022. FINDINGS: No abnormal bowel dilatation is noted. Distal tip of feeding tube is seen in expected position of second or third portion of duodenum. IMPRESSION: Distal tip of feeding tube seen in expected position of second or third portion of duodenum. Electronically Signed   By: Marijo Conception M.D.   On: 09/07/2022 10:44      Assessment/Plan:   Principal Problem:   Generalized weakness Active Problems:   Pressure injury of skin   Nausea and vomiting   Diarrhea   Duodenal ulcer   Mucosal abnormality of stomach   Mucosal abnormality of esophagus   Patient Summary: Alicia [name] is a 71 y.o. with a pertinent PMH of HFpEF, Htn, DMII, CKD4, PAD, prior CVA, Vitamin D Deficiency, who presented with progressive weakness and post-prandial abdominal pain and admitted for decreased functional ability and diffuse abdominal pain of unknown etiology     #Abdominal Pain of  Unknown Etiology  #Lack of PO Intake #Cibophobia? Patient continues to endorse abdominal pain, initially we thought it was due to her duodenal ulcers, however given other medications that we have given her for these ulcers unlikely that this is still causing her pain.  Due to her pain she has trouble eating.  She is worried that every time she does consume something, she will develop profuse abdominal pain.  The pain is not localized to a specific region, and gets exacerbated every time she eats or drinks something.  Due to this, she has not been able to tolerate anything p.o. besides a few sips of water throughout her hospital course.  We were able to get in contact with a core track team this morning, and they placed a feeding tube in past the pyloric stenosis as to not irritate the ulcers.  We will continue to monitor patient's nutritional status.  I suspect that there could be some psych growing larger for reasons behind her not continuing to eat.  She has had consistent low mood, and there has been no real reason for this diffuse abdominal pain medically.  Plan: - Tube feeding starting today   #AKI on CKD 4 Patient's creatinine continues to uptrend, most recent being above 4.  We have hydrated patient consistently with maintenance fluids, however this has not led to any resolution of kidney function.  Attempted to consult nephrology today, however did not receive a page back.  Will attempt tomorrow to see if there is any further work-up that needs to be done.  Renal ultrasound was done, which revealed no obstructive processes.  #Chronic heart failure with preserved ejection fraction Hypertension Patient continues to be euvolemic on exam.  We are continuing amlodipine 10 mg and metoprolol 100 mg  #Type II DM Continue sliding scale insulin, will monitor carefully as patient has had poor p.o. intake   Code: Full    Dispo: Anticipated discharge in less than 5 midnights. Drucie Opitz,  MD PGY-1 Internal Medicine Resident Pager Number [phone] Please contact the on call pager after 5 pm and on weekends at [phone].

## 2022-09-07 NOTE — Progress Notes (Signed)
Brief Nutrition Note:   Cortrak placed today with initial placement with tip in 2nd/3rd portion, Cortrak attempted further advancement and tube tip now located in 4th portion of duodenum. Patient did not tolerate the Cortrak procedure well, asking Cortrak RD to take breaks frequently and asking RD to stop.   Discussed with Attending and plan to start TF today with Cortrak in last portion of duodenum  No phosphorus since admission; plan to check now  Interventions:  Osmolite 1.2 at 20 ml/hr; titrate by 10 mL q 8 hours until goal rate of 60 ml/hr This provides 1728 kcals, 80 g of protein, 912 mL of free water Monitor magnesium, potassium, and phosphorus BID for at least 3 days, MD to replete as needed, as pt is at risk for refeeding syndrome given prolonged inadequate oral intake. Continue Renal MVI, thiamine 840 mg daily, folic acid 1 mg daily, zinc sulfate 220 mg daily   Kerman Passey MS, RDN, LDN, CNSC Registered Dietitian 3 Clinical Nutrition RD Pager and On-Call Pager Number Located in [place]

## 2022-09-08 DIAGNOSIS — N179 Acute kidney failure, unspecified: Secondary | ICD-10-CM | POA: Diagnosis not present

## 2022-09-08 DIAGNOSIS — N390 Urinary tract infection, site not specified: Secondary | ICD-10-CM | POA: Diagnosis not present

## 2022-09-08 DIAGNOSIS — I130 Hypertensive heart and chronic kidney disease with heart failure and stage 1 through stage 4 chronic kidney disease, or unspecified chronic kidney disease: Secondary | ICD-10-CM | POA: Diagnosis not present

## 2022-09-08 DIAGNOSIS — D649 Anemia, unspecified: Secondary | ICD-10-CM | POA: Diagnosis not present

## 2022-09-08 DIAGNOSIS — N184 Chronic kidney disease, stage 4 (severe): Secondary | ICD-10-CM | POA: Diagnosis not present

## 2022-09-08 DIAGNOSIS — E1110 Type 2 diabetes mellitus with ketoacidosis without coma: Secondary | ICD-10-CM | POA: Diagnosis not present

## 2022-09-08 DIAGNOSIS — I509 Heart failure, unspecified: Secondary | ICD-10-CM | POA: Diagnosis not present

## 2022-09-08 DIAGNOSIS — E876 Hypokalemia: Secondary | ICD-10-CM | POA: Diagnosis not present

## 2022-09-08 DIAGNOSIS — R531 Weakness: Secondary | ICD-10-CM | POA: Diagnosis not present

## 2022-09-08 DIAGNOSIS — R109 Unspecified abdominal pain: Secondary | ICD-10-CM | POA: Diagnosis not present

## 2022-09-08 LAB — PHOSPHORUS
Phosphorus: 2.2 mg/dL — ABNORMAL LOW (ref 2.5–4.6)
Phosphorus: 2.6 mg/dL (ref 2.5–4.6)

## 2022-09-08 LAB — MAGNESIUM
Magnesium: 1.9 mg/dL (ref 1.7–2.4)
Magnesium: 2.1 mg/dL (ref 1.7–2.4)

## 2022-09-08 LAB — BASIC METABOLIC PANEL
Anion gap: 11 (ref 5–15)
BUN: 45 mg/dL — ABNORMAL HIGH (ref 8–23)
CO2: 20 mmol/L — ABNORMAL LOW (ref 22–32)
Calcium: 7.5 mg/dL — ABNORMAL LOW (ref 8.9–10.3)
Chloride: 107 mmol/L (ref 98–111)
Creatinine, Ser: 4.29 mg/dL — ABNORMAL HIGH (ref 0.44–1.00)
GFR, Estimated: 10 mL/min — ABNORMAL LOW (ref >60)
Glucose, Bld: 188 mg/dL — ABNORMAL HIGH (ref 70–99)
Potassium: 3.2 mmol/L — ABNORMAL LOW (ref 3.5–5.1)
Sodium: 138 mmol/L (ref 135–145)

## 2022-09-08 LAB — GLUCOSE, CAPILLARY
Glucose-Capillary: 155 mg/dL — ABNORMAL HIGH (ref 70–99)
Glucose-Capillary: 186 mg/dL — ABNORMAL HIGH (ref 70–99)
Glucose-Capillary: 168 mg/dL — ABNORMAL HIGH (ref 70–99)
Glucose-Capillary: 240 mg/dL — ABNORMAL HIGH (ref 70–99)
Glucose-Capillary: 181 mg/dL — ABNORMAL HIGH (ref 70–99)

## 2022-09-08 LAB — PANCREATIC ELASTASE, FECAL: Pancreatic Elastase-1, Stool: <50 ug Elast./g — ABNORMAL LOW (ref >200)

## 2022-09-08 MED ORDER — POTASSIUM CHLORIDE 20 MEQ PO PACK
20.0000 meq | PACK | Freq: Once | ORAL | Status: AC
  Administered 2022-09-08: 20 meq
  Filled 2022-09-08: qty 1

## 2022-09-08 MED ORDER — SODIUM CHLORIDE 0.9 % IV SOLN
1.0000 g | INTRAVENOUS | Status: DC
  Administered 2022-09-08 – 2022-09-09 (×2): 1 g via INTRAVENOUS
  Filled 2022-09-08 (×2): qty 10

## 2022-09-08 MED ORDER — SODIUM PHOSPHATES 45 MMOLE/15ML IV SOLN
15.0000 mmol | Freq: Once | INTRAVENOUS | Status: AC
  Administered 2022-09-08: 15 mmol via INTRAVENOUS
  Filled 2022-09-08: qty 5

## 2022-09-08 MED ORDER — SODIUM BICARBONATE 650 MG PO TABS
650.0000 mg | ORAL_TABLET | Freq: Two times a day (BID) | ORAL | Status: DC
  Administered 2022-09-08 – 2022-09-20 (×23): 650 mg via ORAL
  Filled 2022-09-08 (×23): qty 1

## 2022-09-08 MED ORDER — ZINC OXIDE 12.8 % EX OINT
TOPICAL_OINTMENT | CUTANEOUS | Status: DC | PRN
  Filled 2022-09-08 (×6): qty 56.7

## 2022-09-08 MED ORDER — LACTATED RINGERS IV SOLN: INTRAVENOUS | Status: AC

## 2022-09-08 NOTE — Consult Note (Signed)
[facility] Nurse Consult Note: Reason for Consult: Patient with incontinence of bowel and bladder and moisture (incontinence) associated skin damage. Skin is intact and is listed as "frequently moist" on most recent Braden scores.  Recommend exposure to dermatherapy therapeutic linen against skin to wick moisture and improve skin microclimate. Female external urinary manager to divert urinary incontinence and barrier cream twice daily and PRN soilage.  Wound type: Moisture associated skin damage from incontinence Pressure Injury POA: NA Measurement: generalized redness to perineal skin/buttocks and lower abdomen Wound HJS:CBIPJR Drainage (amount, consistency, odor) NA Periwound:intact Dressing procedure/placement/frequency: Triple paste twice daily and PRN soilage.  Will not follow at this time.  Please re-consult if needed.  Domenic Moras MSN, RN, FNP-BC [facility] Wound, Ostomy, Continence Nurse Pager [phone]

## 2022-09-08 NOTE — Progress Notes (Signed)
Subjective:   Summary: Alicia [name] is a 71 y.o. year old female currently admitted on the I[facility] HD#9 for diffuse abdominal pain of unknown etiology, as well as severely decreased PO intake.  Overnight Events: NOE   Pt was seen this AM bedside. Her mood and affect have not changed since admission. She states she is feeling slightly better, however still endorses being weak.   Objective:  Vital signs in last 24 hours: Vitals:   09/07/22 2237 09/08/22 0501 09/08/22 0800 09/08/22 0951  BP: 138/78 (!) 150/69 (!) 139/57   Pulse: 74 74 (!) 54 71  Resp: '16 18 20   '$ Temp: 98 F (36.7 C) 97.8 F (36.6 C) 98.1 F (36.7 C)   TempSrc: Oral Oral Oral   SpO2: 96% 99% 99%   Weight:      Height:       Supplemental O2: Room Air SpO2: 99 % O2 Flow Rate (L/min): 2 L/min   Physical Exam:  Constitutional: Elderly, obese woman with very low mood, in no acute distress HEENT: Feeding tube in place Cardiovascular: RRR, no murmurs, rubs or gallops Pulmonary/Chest: normal work of breathing on room air, lungs clear to auscultation bilaterally Abdominal: Decreased bowel sounds, TTP in throughout abdomen  Skin: warm and dry Extremities: No lower extremity edema present  Filed Weights   08/24/22 1519  Weight: 110 kg     Intake/Output Summary (Last 24 hours) at 09/08/2022 1503 Last data filed at 09/08/2022 1202 Gross per 24 hour  Intake 1370.57 ml  Output 451 ml  Net 919.57 ml    Net IO Since Admission: 11,337.44 mL [09/08/22 1503]  Pertinent Labs:    Latest Ref Rng & Units 08/31/2022    1:32 AM 08/30/2022    3:32 AM 08/27/2022    4:46 AM  CBC  WBC 4.0 - 10.5 K/uL 6.2  6.7  7.4   Hemoglobin 12.0 - 15.0 g/dL 11.3  10.5  11.7   Hematocrit 36.0 - 46.0 % 34.2  31.4  33.8   Platelets 150 - 400 K/uL 264  227  229        Latest Ref Rng & Units 09/08/2022    6:32 AM 09/07/2022    6:02 AM 09/06/2022    2:53 AM  CMP  Glucose 70 - 99 mg/dL 188  147  105   BUN  8 - 23 mg/dL 45  45  43   Creatinine 0.44 - 1.00 mg/dL 4.29  4.06  3.73   Sodium 135 - 145 mmol/L 138  138  135   Potassium 3.5 - 5.1 mmol/L 3.2  3.8  3.5   Chloride 98 - 111 mmol/L 107  107  109   CO2 22 - 32 mmol/L '20  20  21   '$ Calcium 8.9 - 10.3 mg/dL 7.5  7.4  7.4       Imaging: No results found.    Assessment/Plan:   Principal Problem:   Generalized weakness Active Problems:   Pressure injury of skin   Nausea and vomiting   Diarrhea   Duodenal ulcer   Mucosal abnormality of stomach   Mucosal abnormality of esophagus   Patient Summary: Alicia [name] is a 71 y.o. with a pertinent PMH of HFpEF, Htn, DMII, CKD4, PAD, prior CVA, Vitamin D Deficiency, who presented with progressive weakness and post-prandial abdominal pain and admitted for decreased functional ability and diffuse abdominal  pain of unknown etiology     #Abdominal Pain of Unknown Etiology  #Lack of PO Intake #Cibophobia? She continues to endorse this diffuse abdominal pain, and feeding tube has been put in place. Upon encounter, she was at 45m/hr. She seemed to be tolerating well, although rather uncomfortable. Earlier in admission, we had discussed doing an MRA but decided against it as we didn't think patient could handle it given her kidney function. Upon being aware that the contrast being used now is kidney safe, we will be getting an MRA to assess for chronic mesenteric ischemia.  Plan: - Tube feeding today, will monitor tolerance - F/U MRA  -F/U Phosphorous, magnesium, potassium  #AKI on CKD 4 Patient's creatinine continues to uptrend, most recent being above 4.  We have hydrated patient consistently with maintenance fluids, however this has not led to any resolution of kidney function.  Attempted to consult nephrology today, however did not receive a page back.  Will attempt tomorrow to see if there is any further work-up that needs to be done.  Renal ultrasound was done, which revealed no  obstructive processes.  Plan:  - Discussed case with nephrology, they will evaluate  - Multiple bacteria were noted on UA, however patient has been asymptomatic. Will continue to monitor.   #Chronic heart failure with preserved ejection fraction Hypertension Patient continues to be euvolemic on exam.  We are continuing amlodipine 10 mg and metoprolol 100 mg  #Type II DM Continue sliding scale insulin, will monitor carefully as patient has had poor p.o. intake  #Moisture Associated Skin Damage Pt was noted to have two skin breakdown areas on buttocks. Likely due to bladder incontinence. Wound care was consulted and recommended using triple paste twice daily and PRN soilage.    Code: Full    Dispo: Anticipated discharge in less than 5 midnights. SDrucie Opitz MD PGY-1 Internal Medicine Resident Pager Number [phone] contact the on call pager after 5 pm and on weekends at [phone]

## 2022-09-08 NOTE — Progress Notes (Signed)
Occupational Therapy Treatment Patient Details Name: Alicia [name] MRN: [id number] DOB: 1951-08-12 Today's Date: 09/08/2022   History of present illness 71 y.o. female presented to ED 10/4 with FTT. Cortrak placed 10/18 PMhx: CHf, HTN, CVA, T2DM, CKD, vit D deficiency, PAD, depression, HFpEF   OT comments  Patient having difficulty making progress with patient declining getting to EOB.  Patient performed grooming and UB exercises at bed level with patient performing AAROM for BUE shoulders and AROM for elbow flexion/extension. Yellow therapy band attempted for elbow flexion and extension with patient unable to tolerate resistance. Patient's goals may need to be downgraded next visit. Acute OT to continue to follow.    Recommendations for follow up therapy are one component of a multi-disciplinary discharge planning process, led by the attending physician.  Recommendations may be updated based on patient status, additional functional criteria and insurance authorization.    Follow Up Recommendations  Skilled nursing-short term rehab (<3 hours/day)    Assistance Recommended at Discharge Frequent or constant Supervision/Assistance  Patient can return home with the following  Help with stairs or ramp for entrance;Assist for transportation;Assistance with cooking/housework;A lot of help with bathing/dressing/bathroom;A lot of help with walking and/or transfers   Equipment Recommendations  None recommended by OT    Recommendations for Other Services      Precautions / Restrictions Precautions Precautions: Fall Restrictions Weight Bearing Restrictions: No       Mobility Bed Mobility                    Transfers                         Balance                                           ADL either performed or assessed with clinical judgement   ADL Overall ADL's : Needs assistance/impaired Eating/Feeding: Set up;Bed level Eating/Feeding  Details (indicate cue type and reason): encouraged to eat while up in bed with head raised Grooming: Wash/dry hands;Wash/dry face;Supervision/safety;Bed level Grooming Details (indicate cue type and reason): with HOB up                               General ADL Comments: patient declined sitting EOB and performed grooming tasks at bed level    Extremity/Trunk Assessment Upper Extremity Assessment RUE Deficits / Details: chronic R shoulder dysfunction RUE Sensation: WNL RUE Coordination: WNL            Vision       Perception     Praxis      Cognition Arousal/Alertness: Lethargic (possible due to medication) Behavior During Therapy: Flat affect Overall Cognitive Status: Impaired/Different from baseline Area of Impairment: Following commands, Safety/judgement, Problem solving                       Following Commands: Follows one step commands inconsistently, Follows one step commands with increased time Safety/Judgement: Decreased awareness of safety, Decreased awareness of deficits   Problem Solving: Slow processing, Requires verbal cues General Comments: continues to have lethargy and slowed processing        Exercises Exercises: General Upper Extremity General Exercises - Upper Extremity Shoulder Flexion: AAROM, Both, 10 reps, Supine, Other (comment) (2 sets)  Shoulder Extension: AAROM, Both, 10 reps, Supine, Other (comment) (2 sets) Shoulder ABduction: AAROM, Both, 10 reps, Supine, Other (comment) (2 sets) Shoulder ADduction: AAROM, Both, 10 reps, Supine, Other (comment) (2 sets) Elbow Flexion: AROM, Both, 10 reps, Seated    Shoulder Instructions       General Comments      Pertinent Vitals/ Pain       Pain Assessment Pain Assessment: Faces Faces Pain Scale: Hurts little more Pain Location: generalized stomach pain Pain Descriptors / Indicators: Guarding, Grimacing, Tightness, Discomfort Pain Intervention(s): Limited activity within  patient's tolerance, Monitored during session, Repositioned  Home Living                                          Prior Functioning/Environment              Frequency  Min 2X/week        Progress Toward Goals  OT Goals(current goals can now be found in the care plan section)  Progress towards OT goals: Progressing toward goals  Acute Rehab OT Goals Patient Stated Goal: feel better OT Goal Formulation: With patient Time For Goal Achievement: 09/08/22 Potential to Achieve Goals: Fair ADL Goals Pt Will Perform Grooming: with supervision;standing Pt Will Perform Lower Body Dressing: with supervision;sit to/from stand Pt Will Transfer to Toilet: with supervision;stand pivot transfer;bedside commode Pt/caregiver will Perform Home Exercise Program: Increased strength;Both right and left upper extremity;With theraband;With Supervision  Plan Discharge plan remains appropriate    Co-evaluation                 AM-PAC OT "6 Clicks" Daily Activity     Outcome Measure   Help from another person eating meals?: A Little Help from another person taking care of personal grooming?: A Little Help from another person toileting, which includes using toliet, bedpan, or urinal?: A Lot Help from another person bathing (including washing, rinsing, drying)?: A Lot Help from another person to put on and taking off regular upper body clothing?: A Little Help from another person to put on and taking off regular lower body clothing?: Total 6 Click Score: 14    End of Session    OT Visit Diagnosis: Unsteadiness on feet (R26.81);Muscle weakness (generalized) (M62.81);History of falling (Z91.81)   Activity Tolerance Patient limited by lethargy;Patient limited by pain   Patient Left in bed;with call bell/phone within reach;with bed alarm set   Nurse Communication Mobility status        Time: 1027-2536 OT Time Calculation (min): 19 min  Charges: OT General  Charges $OT Visit: 1 Visit OT Treatments $Self Care/Home Management : 8-22 mins  Lodema Hong, [place]  Office [place] 09/08/2022, 3:02 PM

## 2022-09-08 NOTE — Progress Notes (Signed)
Physical Therapy Treatment Patient Details Name: Alicia [name] MRN: [id number] DOB: Feb 06, 1951 Today's Date: 09/08/2022   History of Present Illness 71 y.o. female presented to ED 10/4 with FTT. Cortrak placed 10/18 PMhx: CHf, HTN, CVA, T2DM, CKD, vit D deficiency, PAD, depression, HFpEF    PT Comments    Pt lethargic laying in bed, but agreeable to trying to sit EoB. When blankets pulled back pt found to have large amount of stool in bed soaked through sheets. Pt reports she in unaware of when she has had a bowel movement. Pt provided modAx2 for rolling to perform pericare. Pt noted to have large area of MASD. RN notified. Pt reports increased fatigue with cleaning her and her bed, requesting not to come to EoB.  Recommend Wound RN consult and moving pt to air bed decreased awareness of sacral area and decreased spontaneous movement to off weight sacrum.     Recommendations for follow up therapy are one component of a multi-disciplinary discharge planning process, led by the attending physician.  Recommendations may be updated based on patient status, additional functional criteria and insurance authorization.  Follow Up Recommendations  Skilled nursing-short term rehab (<3 hours/day) Can patient physically be transported by private vehicle: No   Assistance Recommended at Discharge Frequent or constant Supervision/Assistance  Patient can return home with the following A little help with walking and/or transfers;Assistance with cooking/housework;Direct supervision/assist for medications management;Assist for transportation;A lot of help with bathing/dressing/bathroom   Equipment Recommendations  None recommended by PT       Precautions / Restrictions Precautions Precautions: Fall Restrictions Weight Bearing Restrictions: No     Mobility  Bed Mobility Overal bed mobility: Needs Assistance Bed Mobility: Rolling, Supine to Sit, Sit to Supine Rolling: Mod assist, +2 for  physical assistance         General bed mobility comments: vc for sequencing for rolling for pericare, pt requiring increased time on sides due to extent of stooling, once cleaned pt reports too much fatigue to try to sit up today                         Cognition Arousal/Alertness: Lethargic (became more lethargic at end of session possible due to pain medication) Behavior During Therapy: Flat affect Overall Cognitive Status: Impaired/Different from baseline Area of Impairment: Following commands, Safety/judgement, Problem solving                       Following Commands: Follows one step commands inconsistently, Follows one step commands with increased time Safety/Judgement: Decreased awareness of safety, Decreased awareness of deficits   Problem Solving: Slow processing, Requires verbal cues General Comments: continues to have lethargy and slowed processing           General Comments General comments (skin integrity, edema, etc.): pt with decreasing spontaneous movement and ability to offload sacrum, also found to have extensive area of MASD from constant incontinence, showed RN and recommended Wound RN consult and changing bed to air matress      Pertinent Vitals/Pain Pain Assessment Pain Assessment: Faces Faces Pain Scale: Hurts even more Pain Location: generalized stomach pain, and bleeding MASD Pain Descriptors / Indicators: Tightness, Sore, Moaning, Grimacing, Guarding, Discomfort Pain Intervention(s): Limited activity within patient's tolerance, Monitored during session, Repositioned     PT Goals (current goals can now be found in the care plan section) Acute Rehab PT Goals PT Goal Formulation: With patient Time For Goal Achievement: 09/08/22  Potential to Achieve Goals: Fair Progress towards PT goals: Not progressing toward goals - comment    Frequency    Min 2X/week      PT Plan Current plan remains appropriate       AM-PAC PT "6  Clicks" Mobility   Outcome Measure  Help needed turning from your back to your side while in a flat bed without using bedrails?: Total Help needed moving from lying on your back to sitting on the side of a flat bed without using bedrails?: Total Help needed moving to and from a bed to a chair (including a wheelchair)?: Total Help needed standing up from a chair using your arms (e.g., wheelchair or bedside chair)?: Total Help needed to walk in hospital room?: Total Help needed climbing 3-5 steps with a railing? : Total 6 Click Score: 6    End of Session   Activity Tolerance: Patient limited by fatigue Patient left: in bed;with call bell/phone within reach Nurse Communication: Mobility status PT Visit Diagnosis: Other abnormalities of gait and mobility (R26.89);History of falling (Z91.81);Muscle weakness (generalized) (M62.81)     Time: 6060-0459 PT Time Calculation (min) (ACUTE ONLY): 28 min  Charges:  $Therapeutic Activity: 23-37 mins                     Norm Wray B. Migdalia Dk PT, DPT Acute Rehabilitation Services Please use secure chat or  Call Office [phone]    [place] 09/08/2022, 10:10 AM

## 2022-09-08 NOTE — Consult Note (Signed)
Nephrology Consult   Assessment/Recommendations:   AKI on CKD4 -was previously following with Dr. Joylene Grapes at the office. 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 UA suggestive of UTI, would recommend starting abx accordingly. Would also recommend stopping frequent voltaren gel. -renal u/s without obstruction -will start LR, low rate x 1 day given that she has started on TF's recently -Avoid nephrotoxic medications including NSAIDs and iodinated intravenous contrast exposure unless the latter is absolutely indicated.  Preferred narcotic agents for pain control are hydromorphone, fentanyl, and methadone. Morphine should not be used. Avoid Baclofen and avoid oral sodium phosphate and magnesium citrate based laxatives / bowel preps. Continue strict Input and Output monitoring. Will monitor the patient closely with you and intervene or adjust therapy as indicated by changes in clinical status/labs   Abd pain -etiology unknown, w/u + mgmt per primary  UTI -on UA, starting rocephin and checking ucx  Hypertension -BP acceptable. 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   [facility] 09/08/2022 3:20 PM   _____________________________________________________________________________________   History of Present Illness: Alicia [name] is a/an 71 y.o. female with a past  medical history of CKD 4, PAD, hypertension, CHF, DM2, hyperlipidemia who presents to [facility] initially with weakness.  Also had very poor appetite and abdominal pain.  Etiology of her abdominal pain is unknown despite treatment for duodenal ulcers and duodenitis. She has a core track in place to assist with her nutritional status. TF's started today  She has not seen Dr. Joylene Grapes at the office since April 2022.  Since then, creatinine has progressively worsened and her baseline creatinine seems to range around 2.4.  Patient seen and examined bedside in 61m She reports that she is tolerating her NGT and TF's but still with ongoing abd pain. She does report some slight swelling in her legs. No other complaints.  Medications:  Current Facility-Administered Medications  Medication Dose Route Frequency Provider Last Rate Last Admin   acetaminophen (TYLENOL) suppository 650 mg  650 mg Rectal Q6H Pham, Minh Q, RPH-CPP       Or   acetaminophen (TYLENOL) 160 MG/5ML solution 1,000 mg  1,000 mg Per Tube Q6H Pham, Minh Q, RPH-CPP   1,000 mg at 09/08/22 1247   amLODipine (NORVASC) tablet 10 mg  10 mg Per Tube Daily Pham, Minh Q, RPH-CPP   10 mg at 09/08/22 0947   diclofenac Sodium (VOLTAREN) 1 % topical gel 4 g  4 g Topical QID GRomana Juniper MD   4 g at 09/08/22 0949   dicyclomine (BENTYL) capsule 10 mg  10 mg Per Tube TID AC & HS Pham, Minh Q, RPH-CPP   10 mg at 09/08/22 1242   DULoxetine (CYMBALTA) DR capsule 30 mg  30 mg Oral BID Nooruddin, Saad, MD   30 mg at 09/08/22 0948   enoxaparin (LOVENOX) injection 40 mg  40 mg Subcutaneous Q24H GVelna Ochs MD   40 mg at 09/07/22 2107  feeding supplement (ENSURE ENLIVE / ENSURE PLUS) liquid 237 mL  237 mL Oral BID BM Lacinda Axon, MD   237 mL at 09/05/22 1415   feeding supplement (OSMOLITE 1.2 CAL) liquid 1,000 mL  1,000 mL Per Tube Continuous Nooruddin, Saad, MD 40 mL/hr at 09/08/22 1009 Rate Change at 22/29/79 8921   folic acid (FOLVITE)  tablet 1 mg  1 mg Per Tube Daily Pham, Minh Q, RPH-CPP   1 mg at 09/08/22 0947   HYDROmorphone (DILAUDID) injection 0.5 mg  0.5 mg Intravenous Q4H PRN Rick Duff, MD   0.5 mg at 09/06/22 1302   insulin aspart (novoLOG) injection 0-6 Units  0-6 Units Subcutaneous Q4H Rick Duff, MD   1 Units at 09/08/22 1247   metoprolol tartrate (LOPRESSOR) tablet 50 mg  50 mg Per Tube BID Angelica Pou, MD   50 mg at 09/08/22 1941   multivitamin liquid 15 mL  15 mL Per Tube Daily Pham, Minh Q, RPH-CPP   15 mL at 09/08/22 0947   pantoprazole (PROTONIX) 2 mg/mL oral suspension 40 mg  40 mg Per Tube BID Pham, Minh Q, RPH-CPP   40 mg at 09/08/22 0947   phenol (CHLORASEPTIC) mouth spray 1 spray  1 spray Mouth/Throat PRN Rick Duff, MD       phenylephrine-shark liver oil-mineral oil-petrolatum (PREPARATION H) rectal ointment 1 Application  1 Application Rectal BID PRN Rick Duff, MD       rosuvastatin (CRESTOR) tablet 10 mg  10 mg Per Tube QPM Pham, Minh Q, RPH-CPP       sucralfate (CARAFATE) 1 GM/10ML suspension 1 g  1 g Per Tube Q6H Pham, Minh Q, RPH-CPP   1 g at 09/08/22 1246   thiamine (VITAMIN B1) tablet 100 mg  100 mg Per Tube Daily Pham, Minh Q, RPH-CPP   100 mg at 09/08/22 0948   Zinc Oxide (TRIPLE PASTE) 12.8 % ointment   Topical PRN Angelica Pou, MD       zinc sulfate capsule 220 mg  220 mg Per Tube Daily Pham, Minh Q, RPH-CPP   220 mg at 09/08/22 7408     ALLERGIES Nifedipine er, Latex, and Lipitor [atorvastatin]  MEDICAL HISTORY Past Medical History:  Diagnosis Date   Allergy    Arthritis    Chronic headaches    Constipation    Depression    Diabetes mellitus    GERD (gastroesophageal reflux disease)    Hyperlipidemia    Hypertension    Obesity    PAD (peripheral artery disease) ([place])    stage 4    Stroke [facility])      SOCIAL HISTORY Social History   Socioeconomic History   Marital status: Single    Spouse name: Not on file   Number of children:  Not on file   Years of education: Not on file   Highest education level: Not on file  Occupational History   Not on file  Tobacco Use   Smoking status: Light Smoker    Packs/day: 0.50    Years: 35.00    Total pack years: 17.50    Types: Cigarettes   Smokeless tobacco: Never   Tobacco comments:    patient is trying to quit and has gone down to half a pack per day 1 per day.  Vaping Use   Vaping Use: Never used  Substance and Sexual Activity   Alcohol use: Yes    Alcohol/week: 1.0 standard drink of alcohol    Types: 1  Standard drinks or equivalent per week    Comment: occasional use only   Drug use: No   Sexual activity: Not Currently  Other Topics Concern   Not on file  Social History Narrative   Not on file   Social Determinants of Health   Financial Resource Strain: Not on file  Food Insecurity: No Food Insecurity (09/04/2022)   Hunger Vital Sign    Worried About Running Out of Food in the Last Year: Never true    Ran Out of Food in the Last Year: Never true  Transportation Needs: Unmet Transportation Needs (09/04/2022)   PRAPARE - Transportation    Lack of Transportation (Medical): Yes    Lack of Transportation (Non-Medical): Yes  Physical Activity: Inactive (07/28/2021)   Exercise Vital Sign    Days of Exercise per Week: 0 days    Minutes of Exercise per Session: 0 min  Stress: Not on file  Social Connections: Not on file  Intimate Partner Violence: Not At Risk (09/04/2022)   Humiliation, Afraid, Rape, and Kick questionnaire    Fear of Current or Ex-Partner: No    Emotionally Abused: No    Physically Abused: No    Sexually Abused: No     FAMILY HISTORY Family History  Problem Relation Age of Onset   Diabetes Father    Heart disease Mother        NOT before age 36-  Bypass   Hypertension Mother    Hypertension Sister    Varicose Veins Brother    Heart disease Brother        Before age 6   Hypertension Daughter    Colon cancer Neg Hx    Esophageal  cancer Neg Hx    Rectal cancer Neg Hx    Stomach cancer Neg Hx      Review of Systems: 12 systems reviewed Otherwise as per HPI, all other systems reviewed and negative  Physical Exam: Vitals:   09/08/22 0800 09/08/22 0951  BP: (!) 139/57   Pulse: (!) 54 71  Resp: 20   Temp: 98.1 F (36.7 C)   SpO2: 99%    Total I/O In: 0  Out: 1 [Stool:1]  Intake/Output Summary (Last 24 hours) at 09/08/2022 1520 Last data filed at 09/08/2022 1202 Gross per 24 hour  Intake 1370.57 ml  Output 451 ml  Net 919.57 ml   General: chronically ill-appearing, no acute distress HEENT: anicteric sclera, oropharynx clear without lesions CV: regular rate, normal rhythm, no murmurs, no gallops, no rubs Lungs: clear to auscultation bilaterally, normal work of breathing Abd: soft, tender to palpation diffusely, no rebound/rigidity, non-distended Skin: no visible lesions or rashes Psych: flat affect Musculoskeletal: trace dependent edema Neuro: normal speech, no gross focal deficits   Test Results Reviewed Lab Results  Component Value Date   NA 138 09/08/2022   K 3.2 (L) 09/08/2022   CL 107 09/08/2022   CO2 20 (L) 09/08/2022   BUN 45 (H) 09/08/2022   CREATININE 4.29 (H) 09/08/2022   CALCIUM 7.5 (L) 09/08/2022   ALBUMIN 2.4 (L) 08/25/2022   PHOS 2.2 (L) 09/08/2022     I have reviewed all relevant outside healthcare records related to the patient's kidney injury.

## 2022-09-08 NOTE — Progress Notes (Signed)
Mobility Specialist Progress Note:   09/08/22 1215  Mobility  Activity  (transferred to air bed)  Level of Assistance +2 (takes two people)  Assistive Device None  Activity Response Tolerated well  Mobility Referral No  $Mobility charge 1 Mobility   NT requesting assistance with transferring pt to air bed. Pt tolerated well. Left with all needs met.   Nelta Numbers Acute Rehab Secure Chat or Office Phone: [phone]

## 2022-09-08 NOTE — Progress Notes (Signed)
Patient found to have MASD on buttocks due to incontinence. Two new non pressure wounds on buttocks area. MD Rick Duff made aware. Wound consult, zinc ordered, foam dressings in place, and bed switched to air bed.

## 2022-09-09 ENCOUNTER — Inpatient Hospital Stay (HOSPITAL_COMMUNITY): Payer: Medicare HMO

## 2022-09-09 DIAGNOSIS — N179 Acute kidney failure, unspecified: Secondary | ICD-10-CM | POA: Diagnosis not present

## 2022-09-09 DIAGNOSIS — I700 Atherosclerosis of aorta: Secondary | ICD-10-CM | POA: Diagnosis not present

## 2022-09-09 DIAGNOSIS — R531 Weakness: Secondary | ICD-10-CM | POA: Diagnosis not present

## 2022-09-09 DIAGNOSIS — N390 Urinary tract infection, site not specified: Secondary | ICD-10-CM | POA: Diagnosis not present

## 2022-09-09 DIAGNOSIS — E1110 Type 2 diabetes mellitus with ketoacidosis without coma: Secondary | ICD-10-CM | POA: Diagnosis not present

## 2022-09-09 DIAGNOSIS — R109 Unspecified abdominal pain: Secondary | ICD-10-CM | POA: Diagnosis not present

## 2022-09-09 DIAGNOSIS — K439 Ventral hernia without obstruction or gangrene: Secondary | ICD-10-CM | POA: Diagnosis not present

## 2022-09-09 DIAGNOSIS — N184 Chronic kidney disease, stage 4 (severe): Secondary | ICD-10-CM | POA: Diagnosis not present

## 2022-09-09 DIAGNOSIS — I130 Hypertensive heart and chronic kidney disease with heart failure and stage 1 through stage 4 chronic kidney disease, or unspecified chronic kidney disease: Secondary | ICD-10-CM | POA: Diagnosis not present

## 2022-09-09 DIAGNOSIS — I509 Heart failure, unspecified: Secondary | ICD-10-CM | POA: Diagnosis not present

## 2022-09-09 DIAGNOSIS — E8809 Other disorders of plasma-protein metabolism, not elsewhere classified: Secondary | ICD-10-CM

## 2022-09-09 DIAGNOSIS — R6339 Other feeding difficulties: Secondary | ICD-10-CM

## 2022-09-09 DIAGNOSIS — D649 Anemia, unspecified: Secondary | ICD-10-CM | POA: Diagnosis not present

## 2022-09-09 DIAGNOSIS — E876 Hypokalemia: Secondary | ICD-10-CM | POA: Diagnosis not present

## 2022-09-09 LAB — GLUCOSE, CAPILLARY
Glucose-Capillary: 181 mg/dL — ABNORMAL HIGH (ref 70–99)
Glucose-Capillary: 182 mg/dL — ABNORMAL HIGH (ref 70–99)
Glucose-Capillary: 183 mg/dL — ABNORMAL HIGH (ref 70–99)
Glucose-Capillary: 184 mg/dL — ABNORMAL HIGH (ref 70–99)
Glucose-Capillary: 200 mg/dL — ABNORMAL HIGH (ref 70–99)
Glucose-Capillary: 211 mg/dL — ABNORMAL HIGH (ref 70–99)

## 2022-09-09 LAB — CBC
HCT: 32.5 % — ABNORMAL LOW (ref 36.0–46.0)
Hemoglobin: 11.3 g/dL — ABNORMAL LOW (ref 12.0–15.0)
MCH: 32.3 pg (ref 26.0–34.0)
MCHC: 34.8 g/dL (ref 30.0–36.0)
MCV: 92.9 fL (ref 80.0–100.0)
Platelets: 256 10*3/uL (ref 150–400)
RBC: 3.5 MIL/uL — ABNORMAL LOW (ref 3.87–5.11)
RDW: 16.6 % — ABNORMAL HIGH (ref 11.5–15.5)
WBC: 7.7 10*3/uL (ref 4.0–10.5)
nRBC: 0 % (ref 0.0–0.2)

## 2022-09-09 LAB — RENAL FUNCTION PANEL
Albumin: 1.7 g/dL — ABNORMAL LOW (ref 3.5–5.0)
Anion gap: 9 (ref 5–15)
BUN: 44 mg/dL — ABNORMAL HIGH (ref 8–23)
CO2: 21 mmol/L — ABNORMAL LOW (ref 22–32)
Calcium: 7.4 mg/dL — ABNORMAL LOW (ref 8.9–10.3)
Chloride: 107 mmol/L (ref 98–111)
Creatinine, Ser: 4.21 mg/dL — ABNORMAL HIGH (ref 0.44–1.00)
GFR, Estimated: 11 mL/min — ABNORMAL LOW (ref >60)
Glucose, Bld: 207 mg/dL — ABNORMAL HIGH (ref 70–99)
Phosphorus: 2.2 mg/dL — ABNORMAL LOW (ref 2.5–4.6)
Potassium: 3.5 mmol/L (ref 3.5–5.1)
Sodium: 137 mmol/L (ref 135–145)

## 2022-09-09 LAB — MAGNESIUM: Magnesium: 2 mg/dL (ref 1.7–2.4)

## 2022-09-09 MED ORDER — ALBUMIN HUMAN 25 % IV SOLN
25.0000 g | Freq: Four times a day (QID) | INTRAVENOUS | Status: AC
  Administered 2022-09-09 (×2): 25 g via INTRAVENOUS
  Filled 2022-09-09 (×2): qty 100

## 2022-09-09 MED ORDER — GADOBUTROL 1 MMOL/ML IV SOLN
10.0000 mL | Freq: Once | INTRAVENOUS | Status: AC | PRN
  Administered 2022-09-09: 10 mL via INTRAVENOUS

## 2022-09-09 NOTE — Progress Notes (Signed)
Nephrology Follow-Up Consult note   Assessment/Recommendations: Alicia [name] is a/an 71 y.o. female with a past medical history significant for CKD 4, PAD, HTN, CHF, DM2, HLD, admitted for AKI, abdominal pain, and poor PO intake.       AKI on CKD 4: BL possibly 2.5-3 but fluctuates. Listed as my clinic patient but has not seen me in 1.5 years. She requested to change doctors but still has no showed to numerous appointments and refused to schedule appointments.  AKI likely dehydration possible ongoing state leading to tubular injury. Also significant voltaren use -Continue w/ IV hydration -Continue to monitor daily Cr, Dose meds for GFR -Monitor Daily I/Os, Daily weight  -Maintain MAP>65 for optimal renal perfusion.  -Avoid nephrotoxic medications including NSAIDs -Use synthetic opioids (Fentanyl/Dilaudid) if needed -Currently no indication for HD  Abd pain: unknown etiology. NG tube in place. Per primary  UTI: continue rocephin. F/u culture  HTN: BP slightly high, ctm. Cont current meds  Hypoalbuminemia: likely multifactorial. Given <2 will supplement w/ 25g x2 doses   Recommendations conveyed to primary service.    [facility] 09/09/2022 10:24 AM  ___________________________________________________________  CC: AKI, abdominal pain  Interval History/Subjective: Patient is minimally interactive today.  Does not answer most questions.  Denies any complaints.  Unsure if she feels any better today.   Medications:  Current Facility-Administered Medications  Medication Dose Route Frequency Provider Last Rate Last Admin   acetaminophen (TYLENOL) suppository 650 mg  650 mg Rectal Q6H Pham, Minh Q, RPH-CPP       Or   acetaminophen (TYLENOL) 160 MG/5ML solution 1,000 mg  1,000 mg Per Tube Q6H Pham, Minh Q, RPH-CPP   1,000 mg at 09/09/22 0506   amLODipine (NORVASC) tablet 10 mg  10 mg Per Tube Daily Pham, Minh Q, RPH-CPP   10 mg at 09/09/22  0853   cefTRIAXone (ROCEPHIN) 1 g in sodium chloride 0.9 % 100 mL IVPB  1 g Intravenous Q24H Gean Quint, MD 200 mL/hr at 09/08/22 1721 1 g at 09/08/22 1721   dicyclomine (BENTYL) capsule 10 mg  10 mg Per Tube TID AC & HS Pham, Minh Q, RPH-CPP   10 mg at 09/09/22 0853   DULoxetine (CYMBALTA) DR capsule 30 mg  30 mg Oral BID Nooruddin, Saad, MD   30 mg at 09/09/22 0853   enoxaparin (LOVENOX) injection 40 mg  40 mg Subcutaneous Q24H Velna Ochs, MD   40 mg at 09/08/22 2149   feeding supplement (ENSURE ENLIVE / ENSURE PLUS) liquid 237 mL  237 mL Oral BID BM Lacinda Axon, MD   237 mL at 09/05/22 1415   feeding supplement (OSMOLITE 1.2 CAL) liquid 1,000 mL  1,000 mL Per Tube Continuous Nooruddin, Saad, MD 60 mL/hr at 09/09/22 0044 1,000 mL at 50/27/74 1287   folic acid (FOLVITE) tablet 1 mg  1 mg Per Tube Daily Pham, Minh Q, RPH-CPP   1 mg at 09/09/22 0853   HYDROmorphone (DILAUDID) injection 0.5 mg  0.5 mg Intravenous Q4H PRN Rick Duff, MD   0.5 mg at 09/09/22 8676   insulin aspart (novoLOG) injection 0-6 Units  0-6 Units Subcutaneous Q4H Rick Duff, MD   1 Units at 09/09/22 7209   lactated ringers infusion   Intravenous Continuous Gean Quint, MD 50 mL/hr at 09/08/22 2148 New Bag at 09/08/22 2148   metoprolol tartrate (LOPRESSOR) tablet 50 mg  50 mg Per Tube BID Angelica Pou, MD   50 mg at  09/09/22 0853   multivitamin liquid 15 mL  15 mL Per Tube Daily Pham, Minh Q, RPH-CPP   15 mL at 09/09/22 0853   pantoprazole (PROTONIX) 2 mg/mL oral suspension 40 mg  40 mg Per Tube BID Pham, Minh Q, RPH-CPP   40 mg at 09/09/22 0859   phenol (CHLORASEPTIC) mouth spray 1 spray  1 spray Mouth/Throat PRN Rick Duff, MD       phenylephrine-shark liver oil-mineral oil-petrolatum (PREPARATION H) rectal ointment 1 Application  1 Application Rectal BID PRN Rick Duff, MD       rosuvastatin (CRESTOR) tablet 10 mg  10 mg Per Tube QPM Pham, Minh Q, RPH-CPP   10 mg at 09/08/22  1730   sodium bicarbonate tablet 650 mg  650 mg Oral BID Gean Quint, MD   650 mg at 09/09/22 0853   sucralfate (CARAFATE) 1 GM/10ML suspension 1 g  1 g Per Tube Q6H Pham, Minh Q, RPH-CPP   1 g at 09/09/22 0506   thiamine (VITAMIN B1) tablet 100 mg  100 mg Per Tube Daily Pham, Minh Q, RPH-CPP   100 mg at 09/09/22 3149   Zinc Oxide (TRIPLE PASTE) 12.8 % ointment   Topical PRN Angelica Pou, MD       zinc sulfate capsule 220 mg  220 mg Per Tube Daily Pham, Minh Q, RPH-CPP   220 mg at 09/09/22 7026      Review of Systems: 10 systems reviewed and negative except per interval history/subjective  Physical Exam: Vitals:   09/08/22 2023 09/09/22 0919  BP: (!) 151/66 (!) 152/75  Pulse: 75 65  Resp: 18 19  Temp: 98.2 F (36.8 C) 98 F (36.7 C)  SpO2: 100% 100%   No intake/output data recorded.  Intake/Output Summary (Last 24 hours) at 09/09/2022 1024 Last data filed at 09/09/2022 0601 Gross per 24 hour  Intake 1336.27 ml  Output --  Net 1336.27 ml   Constitutional: Tired and ill-appearing, lying in bed, no distress ENMT: ears and nose without scars or lesions, MMM CV: normal rate, no edema Respiratory: Bilateral chest rise, no increased work of breathing Gastrointestinal: soft, non-tender, no palpable masses or hernias Skin: no visible lesions or rashes Psych: Minimal interaction, depressed mood, blunted affect   Test Results I personally reviewed new and old clinical labs and radiology tests Lab Results  Component Value Date   NA 137 09/09/2022   K 3.5 09/09/2022   CL 107 09/09/2022   CO2 21 (L) 09/09/2022   BUN 44 (H) 09/09/2022   CREATININE 4.21 (H) 09/09/2022   CALCIUM 7.4 (L) 09/09/2022   ALBUMIN 1.7 (L) 09/09/2022   PHOS 2.2 (L) 09/09/2022    CBC Recent Labs  Lab 09/09/22 0438  WBC 7.7  HGB 11.3*  HCT 32.5*  MCV 92.9  PLT 256

## 2022-09-09 NOTE — Inpatient Diabetes Management (Signed)
Inpatient Diabetes Program Recommendations  AACE/ADA: New Consensus Statement on Inpatient Glycemic Control (2015)  Target Ranges:  Prepandial:   less than 140 mg/dL      Peak postprandial:   less than 180 mg/dL (1-2 hours)      Critically ill patients:  140 - 180 mg/dL   Lab Results  Component Value Date   GLUCAP 181 (H) 09/09/2022   HGBA1C 6.2 (A) 06/16/2022    Review of Glycemic Control  Diabetes history: DM2 Outpatient Diabetes medications: Januvia 25 mg QD Current orders for Inpatient glycemic control: Novolog 0-6 units Q4H, Osmolite @ 60/hr  Inpatient Diabetes Program Recommendations:    Once tube feeds at goal, might consider Novolog 2 units Q4H tube feed coverage.  Stop if feeds are held or discontinued.    Will continue to follow while inpatient.  Thank you, Reche Dixon, MSN, [place] [facility] Coordinator Inpatient Diabetes Program [phone] (team pager from 8a-5p)

## 2022-09-09 NOTE — Progress Notes (Addendum)
Subjective:   Summary: Alicia [name] is a 71 y.o. year old female currently admitted on the IMTS HD#10 for diffuse abdominal pain of unknown etiology, as well as severely decreased PO intake.  Overnight Events: NOE   Pt was seen bedside this AM. She was very uncomfortable, and was not able to vocalize why. Every question was answered with her nodding her head no when asked about different symptoms. Unable to tell us exactly why she's hurting, un-willing to speak at all.   Objective:  Vital signs in last 24 hours: Vitals:   09/08/22 0951 09/08/22 1627 09/08/22 2023 09/09/22 0919  BP:  (!) 141/63 (!) 151/66 (!) 152/75  Pulse: 71 70 75 65  Resp:  '18 18 19  '$ Temp:  98.3 F (36.8 C) 98.2 F (36.8 C) 98 F (36.7 C)  TempSrc:  Oral Oral Oral  SpO2:  98% 100% 100%  Weight:      Height:       Supplemental O2: Room Air SpO2: 100 % O2 Flow Rate (L/min): 2 L/min   Physical Exam:  Constitutional: Elderly, obese woman,  uncomfortable, non-vocal, unable to communicate well, Cortrak tube seems to be bothering her throat. Brow furrowed.  Lying still. HEENT: Feeding tube in place, periorbital edema noted Cardiovascular: RRR, no murmurs, rubs, or gallops Abdominal: Decreased bowel sounds, TTP throughout abdomen, no guarding or rigidity.  Periumbilical hernia unchanged. Irreducible but not strangulated. Extremities: upper/lower extremity pulses 2+, no lower extremity edema present.R arm more swollen than L arm - she is leaning toward R, may be positional 3rd spacing   Intake/Output Summary (Last 24 hours) at 09/09/2022 0957 Last data filed at 09/09/2022 0601 Gross per 24 hour  Intake 1336.27 ml  Output --  Net 1336.27 ml   Net IO Since Admission: 12,673.71 mL [09/09/22 0957]  Pertinent Labs:    Latest Ref Rng & Units 09/09/2022    4:38 AM 08/31/2022    1:32 AM 08/30/2022    3:32 AM  CBC  WBC 4.0 - 10.5 K/uL 7.7  6.2  6.7   Hemoglobin 12.0 - 15.0 g/dL 11.3   11.3  10.5   Hematocrit 36.0 - 46.0 % 32.5  34.2  31.4   Platelets 150 - 400 K/uL 256  264  227        Latest Ref Rng & Units 09/09/2022    4:38 AM 09/08/2022    6:32 AM 09/07/2022    6:02 AM  CMP  Glucose 70 - 99 mg/dL 207  188  147   BUN 8 - 23 mg/dL 44  45  45   Creatinine 0.44 - 1.00 mg/dL 4.21  4.29  4.06   Sodium 135 - 145 mmol/L 137  138  138   Potassium 3.5 - 5.1 mmol/L 3.5  3.2  3.8   Chloride 98 - 111 mmol/L 107  107  107   CO2 22 - 32 mmol/L '21  20  20   '$ Calcium 8.9 - 10.3 mg/dL 7.4  7.5  7.4      Assessment/Plan:   Principal Problem:   Generalized weakness Active Problems:   Pressure injury of skin   Nausea and vomiting   Diarrhea   Duodenal ulcer   Mucosal abnormality of stomach   Mucosal abnormality of esophagus   Patient Summary: Alicia [name] is a 71 y.o. with a pertinent PMH of HFpEF, HTN, DMII, CKD4,  PAD, prior CVA, who presented with progressive weakness and post-prandial abdominal pain and admitted for decreased functional ability and diffuse abdominal pain of unknown etiology.   #Abdominal Pain of Unkown Etiology  #Lack of PO Intake  Cibophobia? Tube feed is at goal at 33m/hr. Pt has a much lower mood and affect today than any day throughout her hospital course. She was not able to communicate, and would not attempt to hold a conversation anymore. She was clearly uncomfortable, but was not able to tell u[place]hy. Currently very worried about this patient, as she is declining and we don't have an explanation as to why. If MRA is negative, next step would be a palliative care or psych consult.   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 - Psych consult for food aversion and depression (follow-up) - Continue tube feeding  - Following phosphorous, magnesium, and potassium  #AKI on CKD4 Patient's creatinine remains above 4 (4.2). Nephrology is on board and stated that this  could be most likely due to prolonged pre-renal state leading to tubular injury, and have recommended IV hydration, and monitoring daily Cr. At this point, no indication for hemodialysis. Appreciate nephrology for assistance.   #UTI Patient's UA and symptoms were likely indicative of a urinary tract infection, and rocephin was started. This is currently day 2/5 of antibiotics. Cultures are currently pending.     Code: Full Dispo: Anticipated discharge in less than 5 midnights  SDrucie Opitz MD PGY-1 Internal Medicine Resident Pager Number [phone] contact the on call pager after 5 pm and on weekends at [phone]

## 2022-09-09 NOTE — TOC Progression Note (Signed)
Transition of Care [facility]) - Initial/Assessment Note    Patient Details  Name: Alicia [name] MRN: [id number] Date of Birth: [birth year]  Transition of Care [facility]) CM/SW Contact:    Milinda Antis, [place] Phone Number: 09/09/2022, 2:28 PM  Clinical Narrative:                 TOC following patient for any d/c planning needs once medically stable.  PT/OT are recommending SNF.  Patient currently has a [place] which is a barrier to SNF placement.  SNF workup has been completed and LCSW will follow up with facilities once patient is closer to being medically ready.    Lind Covert, MSW, LCSW    Expected Discharge Plan: Skilled Nursing Facility Barriers to Discharge: Insurance Authorization, SNF Pending bed offer, Continued Medical Work up   Patient Goals and CMS Choice Patient states their goals for this hospitalization and ongoing recovery are:: Pt wants to be able to return home. CMS Medicare.gov Compare Post Acute Care list provided to:: Patient Choice offered to / list presented to : Patient  Expected Discharge Plan and Services Expected Discharge Plan: [place] Acute Care Choice: [place] arrangements for the past 2 months: Single Family Home                                      Prior Living Arrangements/Services Living arrangements for the past 2 months: Single Family Home Lives with:: Self Patient language and need for interpreter reviewed:: Yes Do you feel safe going back to the place where you live?: Yes      Need for Family Participation in Patient Care: Yes (Comment) Care giver support system in place?: No (comment)   Criminal Activity/Legal Involvement Pertinent to Current Situation/Hospitalization: No - Comment as needed  Activities of Daily Living Home Assistive Devices/Equipment: Walker (specify type) ADL Screening (condition at time of admission) Patient's cognitive ability adequate to safely  complete daily activities?: Yes Is the patient deaf or have difficulty hearing?: No Does the patient have difficulty seeing, even when wearing glasses/contacts?: No Does the patient have difficulty concentrating, remembering, or making decisions?: No Patient able to express need for assistance with ADLs?: Yes Does the patient have difficulty dressing or bathing?: Yes Independently performs ADLs?: No Communication: Independent Dressing (OT): Needs assistance Is this a change from baseline?: Pre-admission baseline Grooming: Needs assistance Is this a change from baseline?: Pre-admission baseline Feeding: Independent Bathing: Needs assistance Is this a change from baseline?: Pre-admission baseline Toileting: Independent with device (comment) In/Out Bed: Independent with device (comment) Walks in Home: Independent with device (comment) Does the patient have difficulty walking or climbing stairs?: Yes Weakness of Legs: Both Weakness of Arms/Hands: None  Permission Sought/Granted Permission sought to share information with : Family Supports Permission granted to share information with : No              Emotional Assessment Appearance:: Appears stated age Attitude/Demeanor/Rapport: Guarded Affect (typically observed): Flat Orientation: : Oriented to Self, Oriented to Place, Oriented to  Time, Oriented to Situation Alcohol / Substance Use: Not Applicable Psych Involvement: No (comment)  Admission diagnosis:  Generalized weakness [R53.1] Patient Active Problem List   Diagnosis Date Noted   Duodenal ulcer 09/01/2022   Mucosal abnormality of stomach 09/01/2022   Mucosal abnormality of esophagus 09/01/2022   Abdominal pain 08/30/2022   Nausea and vomiting  Diarrhea    Pressure injury of skin 08/27/2022   Generalized weakness 08/24/2022   Acute exacerbation of congestive heart failure ([place]) 06/30/2022   Hypernatremia 06/30/2022   Hyperkalemia 06/17/2022   Normocytic anemia  06/17/2022   Intertrigo    Metabolic acidosis    Cellulitis 05/20/2022   Vitamin D deficiency 05/20/2022   Headache 02/16/2022   Disequilibrium 12/01/2021   (HFpEF) heart failure with preserved ejection fraction ([place]) 07/18/2021   Exacerbation of RAD (reactive airway disease) 02/27/2020   Stage 4 chronic kidney disease ([place]) 02/27/2020   Tobacco use 02/27/2020   Prolonged Q-T interval on ECG 04/27/2018   Diabetes mellitus type 2 in obese ([place]) 03/16/2018   Severe obesity (BMI >= 40) (HCC)    CVA (cerebral vascular accident) ([place]) 03/15/2018   Peripheral vascular disease, unspecified ([place]) 11/30/2012   Hyperlipidemia 07/20/2007   Primary hypertension 07/20/2007   PCP:  Rick Duff, MD Pharmacy:   [facility] ([facility]  - [address] [place] ([place])  [address] Phone: [phone] Fax: [phone]     Social Determinants of Health (SDOH) Interventions    Readmission Risk Interventions    03/02/2020    1:08 PM  Readmission Risk Prevention Plan  Transportation Screening Complete  PCP or Specialist Appt within 5-7 Days Complete  Home Care Screening Complete  Medication Review (RN CM) Complete

## 2022-09-10 DIAGNOSIS — E876 Hypokalemia: Secondary | ICD-10-CM | POA: Diagnosis not present

## 2022-09-10 DIAGNOSIS — I130 Hypertensive heart and chronic kidney disease with heart failure and stage 1 through stage 4 chronic kidney disease, or unspecified chronic kidney disease: Secondary | ICD-10-CM | POA: Diagnosis not present

## 2022-09-10 DIAGNOSIS — D649 Anemia, unspecified: Secondary | ICD-10-CM | POA: Diagnosis not present

## 2022-09-10 DIAGNOSIS — N184 Chronic kidney disease, stage 4 (severe): Secondary | ICD-10-CM | POA: Diagnosis not present

## 2022-09-10 DIAGNOSIS — R1084 Generalized abdominal pain: Secondary | ICD-10-CM | POA: Diagnosis not present

## 2022-09-10 DIAGNOSIS — R531 Weakness: Secondary | ICD-10-CM | POA: Diagnosis not present

## 2022-09-10 DIAGNOSIS — E1110 Type 2 diabetes mellitus with ketoacidosis without coma: Secondary | ICD-10-CM | POA: Diagnosis not present

## 2022-09-10 DIAGNOSIS — N179 Acute kidney failure, unspecified: Secondary | ICD-10-CM | POA: Diagnosis not present

## 2022-09-10 DIAGNOSIS — N390 Urinary tract infection, site not specified: Secondary | ICD-10-CM | POA: Diagnosis not present

## 2022-09-10 DIAGNOSIS — I509 Heart failure, unspecified: Secondary | ICD-10-CM | POA: Diagnosis not present

## 2022-09-10 DIAGNOSIS — G8929 Other chronic pain: Secondary | ICD-10-CM

## 2022-09-10 DIAGNOSIS — K269 Duodenal ulcer, unspecified as acute or chronic, without hemorrhage or perforation: Secondary | ICD-10-CM | POA: Diagnosis not present

## 2022-09-10 LAB — BASIC METABOLIC PANEL
Anion gap: 10 (ref 5–15)
BUN: 43 mg/dL — ABNORMAL HIGH (ref 8–23)
CO2: 21 mmol/L — ABNORMAL LOW (ref 22–32)
Calcium: 7.5 mg/dL — ABNORMAL LOW (ref 8.9–10.3)
Chloride: 106 mmol/L (ref 98–111)
Creatinine, Ser: 3.98 mg/dL — ABNORMAL HIGH (ref 0.44–1.00)
GFR, Estimated: 11 mL/min — ABNORMAL LOW (ref >60)
Glucose, Bld: 203 mg/dL — ABNORMAL HIGH (ref 70–99)
Potassium: 3.9 mmol/L (ref 3.5–5.1)
Sodium: 137 mmol/L (ref 135–145)

## 2022-09-10 LAB — GLUCOSE, CAPILLARY
Glucose-Capillary: 185 mg/dL — ABNORMAL HIGH (ref 70–99)
Glucose-Capillary: 195 mg/dL — ABNORMAL HIGH (ref 70–99)
Glucose-Capillary: 197 mg/dL — ABNORMAL HIGH (ref 70–99)
Glucose-Capillary: 209 mg/dL — ABNORMAL HIGH (ref 70–99)
Glucose-Capillary: 216 mg/dL — ABNORMAL HIGH (ref 70–99)
Glucose-Capillary: 257 mg/dL — ABNORMAL HIGH (ref 70–99)

## 2022-09-10 LAB — MAGNESIUM: Magnesium: 2 mg/dL (ref 1.7–2.4)

## 2022-09-10 LAB — ALBUMIN: Albumin: 2.3 g/dL — ABNORMAL LOW (ref 3.5–5.0)

## 2022-09-10 LAB — PHOSPHORUS: Phosphorus: 2.1 mg/dL — ABNORMAL LOW (ref 2.5–4.6)

## 2022-09-10 MED ORDER — MIRTAZAPINE 15 MG PO TBDP
7.5000 mg | ORAL_TABLET | Freq: Every day | ORAL | Status: DC
  Administered 2022-09-10 – 2022-09-12 (×3): 7.5 mg via ORAL
  Filled 2022-09-10 (×4): qty 0.5

## 2022-09-10 MED ORDER — PANCRELIPASE (LIP-PROT-AMYL) 12000-38000 UNITS PO CPEP
24000.0000 [IU] | ORAL_CAPSULE | Freq: Three times a day (TID) | ORAL | Status: DC
  Administered 2022-09-10: 24000 [IU] via ORAL
  Filled 2022-09-10: qty 2

## 2022-09-10 MED ORDER — INSULIN ASPART 100 UNIT/ML IJ SOLN
0.0000 [IU] | Freq: Three times a day (TID) | INTRAMUSCULAR | Status: DC
  Administered 2022-09-10: 2 [IU] via SUBCUTANEOUS
  Administered 2022-09-10: 5 [IU] via SUBCUTANEOUS

## 2022-09-10 MED ORDER — INSULIN ASPART 100 UNIT/ML IJ SOLN: 0.0000 [IU] | Freq: Every day | INTRAMUSCULAR | Status: DC

## 2022-09-10 MED ORDER — SODIUM CHLORIDE 0.9 % IV SOLN
1.0000 g | INTRAVENOUS | Status: AC
  Administered 2022-09-10 – 2022-09-12 (×3): 1 g via INTRAVENOUS
  Filled 2022-09-10 (×3): qty 10

## 2022-09-10 MED ORDER — FREE WATER
300.0000 mL | Freq: Four times a day (QID) | Status: DC
  Administered 2022-09-10 – 2022-09-11 (×3): 300 mL

## 2022-09-10 MED ORDER — INSULIN ASPART 100 UNIT/ML IJ SOLN
0.0000 [IU] | INTRAMUSCULAR | Status: DC
  Administered 2022-09-10 – 2022-09-19 (×7): 2 [IU] via SUBCUTANEOUS

## 2022-09-10 MED ORDER — ASPIRIN 81 MG PO TBEC
81.0000 mg | DELAYED_RELEASE_TABLET | Freq: Every day | ORAL | Status: DC
  Administered 2022-09-10 – 2022-09-16 (×7): 81 mg via ORAL
  Filled 2022-09-10 (×7): qty 1

## 2022-09-10 NOTE — Progress Notes (Signed)
Nephrology Follow-Up Consult note   Assessment/Recommendations: Alicia [name] is a/an 71 y.o. female with a past medical history significant for CKD 4, PAD, HTN, CHF, DM2, HLD, admitted for AKI, abdominal pain, and poor PO intake.       AKI on CKD 4: BL possibly 2.5-3 but fluctuates. Listed as my clinic patient but has not seen me in 1.5 years. She requested to change doctors but still has no showed to numerous appointments and refused to schedule appointments.  AKI likely dehydration possible ongoing state leading to tubular injury. Also significant voltaren use.  Creatinine is slowly improving -Continue w/ IV hydration -Continue to monitor daily Cr, Dose meds for GFR -Monitor Daily I/Os, Daily weight  -Maintain MAP>65 for optimal renal perfusion.  -Avoid nephrotoxic medications including NSAIDs -Use synthetic opioids (Fentanyl/Dilaudid) if needed -Currently no indication for HD  Abd pain: unknown etiology. NG tube in place. Per primary  UTI: rocephin for 5 days based on UA and symptoms  HTN: BP slightly high. Cont current meds  Hypoalbuminemia: likely multifactorial. Given <2 will supplement w/ 25g x2 doses on 10/20  Possible renal artery stenosis: Likely indicative of underlying vascular disease involving the kidneys.  Does not change her management at this time.   Recommendations conveyed to primary service.    [facility] 09/10/2022 11:33 AM  ___________________________________________________________  CC: AKI, abdominal pain  Interval History/Subjective: Persistent abdominal pain.  Minimal interaction.  No other complaints   Had MRI with possible intra-abdominal vascular disease.  Also comment of possible renal artery stenosis.  Medications:  Current Facility-Administered Medications  Medication Dose Route Frequency Provider Last Rate Last Admin   acetaminophen (TYLENOL) suppository 650 mg  650 mg Rectal Q6H Pham, Minh Q,  RPH-CPP       Or   acetaminophen (TYLENOL) 160 MG/5ML solution 1,000 mg  1,000 mg Per Tube Q6H Pham, Minh Q, RPH-CPP   1,000 mg at 09/10/22 1027   amLODipine (NORVASC) tablet 10 mg  10 mg Per Tube Daily Pham, Minh Q, RPH-CPP   10 mg at 09/10/22 1027   cefTRIAXone (ROCEPHIN) 1 g in sodium chloride 0.9 % 100 mL IVPB  1 g Intravenous Q24H Gean Quint, MD 200 mL/hr at 09/09/22 1953 1 g at 09/09/22 1953   dicyclomine (BENTYL) capsule 10 mg  10 mg Per Tube TID AC & HS Pham, Minh Q, RPH-CPP   10 mg at 09/10/22 1027   DULoxetine (CYMBALTA) DR capsule 30 mg  30 mg Oral BID Nooruddin, Saad, MD   30 mg at 09/10/22 1027   enoxaparin (LOVENOX) injection 40 mg  40 mg Subcutaneous Q24H Velna Ochs, MD   40 mg at 09/09/22 2215   feeding supplement (ENSURE ENLIVE / ENSURE PLUS) liquid 237 mL  237 mL Oral BID BM Lacinda Axon, MD   237 mL at 09/05/22 1415   feeding supplement (OSMOLITE 1.2 CAL) liquid 1,000 mL  1,000 mL Per Tube Continuous Nooruddin, Saad, MD 60 mL/hr at 09/10/22 0246 1,000 mL at 70/35/00 9381   folic acid (FOLVITE) tablet 1 mg  1 mg Per Tube Daily Pham, Minh Q, RPH-CPP   1 mg at 09/10/22 1028   HYDROmorphone (DILAUDID) injection 0.5 mg  0.5 mg Intravenous Q4H PRN Rick Duff, MD   0.5 mg at 09/09/22 8299   insulin aspart (novoLOG) injection 0-5 Units  0-5 Units Subcutaneous QHS Rick Duff, MD       insulin aspart (novoLOG) injection 0-9 Units  0-9 Units  Subcutaneous TID WC Rick Duff, MD       metoprolol tartrate (LOPRESSOR) tablet 50 mg  50 mg Per Tube BID Angelica Pou, MD   50 mg at 09/10/22 1027   multivitamin liquid 15 mL  15 mL Per Tube Daily Pham, Minh Q, RPH-CPP   15 mL at 09/10/22 1028   pantoprazole (PROTONIX) 2 mg/mL oral suspension 40 mg  40 mg Per Tube BID Pham, Minh Q, RPH-CPP   40 mg at 09/10/22 1028   phenol (CHLORASEPTIC) mouth spray 1 spray  1 spray Mouth/Throat PRN Rick Duff, MD       phenylephrine-shark liver oil-mineral  oil-petrolatum (PREPARATION H) rectal ointment 1 Application  1 Application Rectal BID PRN Rick Duff, MD       rosuvastatin (CRESTOR) tablet 10 mg  10 mg Per Tube QPM Pham, Minh Q, RPH-CPP   10 mg at 09/09/22 1945   sodium bicarbonate tablet 650 mg  650 mg Oral BID Gean Quint, MD   650 mg at 09/10/22 1027   sucralfate (CARAFATE) 1 GM/10ML suspension 1 g  1 g Per Tube Q6H Pham, Minh Q, RPH-CPP   1 g at 09/10/22 1027   thiamine (VITAMIN B1) tablet 100 mg  100 mg Per Tube Daily Pham, Minh Q, RPH-CPP   100 mg at 09/10/22 1027   Zinc Oxide (TRIPLE PASTE) 12.8 % ointment   Topical PRN Angelica Pou, MD       zinc sulfate capsule 220 mg  220 mg Per Tube Daily Pham, Minh Q, RPH-CPP   220 mg at 09/10/22 1027      Review of Systems: 10 systems reviewed and negative except per interval history/subjective  Physical Exam: Vitals:   09/10/22 0426 09/10/22 0805  BP: (!) 165/70 (!) 153/68  Pulse: 76 77  Resp: 18 17  Temp: 98.3 F (36.8 C) 97.9 F (36.6 C)  SpO2: 97% 99%   Total I/O In: 100 [NG/GT:100] Out: -   Intake/Output Summary (Last 24 hours) at 09/10/2022 1133 Last data filed at 09/10/2022 6440 Gross per 24 hour  Intake 1640 ml  Output 0 ml  Net 1640 ml   Constitutional: Tired and ill-appearing, lying in bed, no distress ENMT: ears and nose without scars or lesions, MMM CV: normal rate, no edema Respiratory: Bilateral chest rise, no increased work of breathing Gastrointestinal: Mild distention, no tender to palpation Skin: no visible lesions or rashes Psych: Minimal interaction, depressed mood, blunted affect   Test Results I personally reviewed new and old clinical labs and radiology tests Lab Results  Component Value Date   NA 137 09/10/2022   K 3.9 09/10/2022   CL 106 09/10/2022   CO2 21 (L) 09/10/2022   BUN 43 (H) 09/10/2022   CREATININE 3.98 (H) 09/10/2022   CALCIUM 7.5 (L) 09/10/2022   ALBUMIN 1.7 (L) 09/09/2022   PHOS 2.2 (L) 09/09/2022     CBC Recent Labs  Lab 09/09/22 0438  WBC 7.7  HGB 11.3*  HCT 32.5*  MCV 92.9  PLT 256

## 2022-09-10 NOTE — Progress Notes (Signed)
Mobility Specialist Progress Note:   09/10/22 1545  Mobility  Activity  (transferred beds)  Level of Assistance +2 (takes two people)  Assistive Device MaxiMove  Activity Response Tolerated well  $Mobility charge 1 Mobility   RN requesting assistance with transferring pt to a different bed. Required +2 with maximove. Pt left with all needs met.   [place]elta Numbers Acute Rehab Secure Chat or Office Phone: [phone]

## 2022-09-10 NOTE — Consult Note (Signed)
Face-to-Face Psychiatry Consult   Reason for Consult:  food aversion and depression Referring Physician:  S. Nooruddin  Patient Identification: Alicia [name] MRN:  [id number] Principal Diagnosis: Chronic generalized abdominal pain Diagnosis:  Principal Problem:   Chronic generalized abdominal pain Active Problems:   Acute worsening of stage 4 chronic kidney disease (HCC)   Generalized weakness   Pressure injury of skin   Nausea and vomiting   Diarrhea   Duodenal ulcer   Mucosal abnormality of stomach   Mucosal abnormality of esophagus   Food aversion   Hypoalbuminemia due to protein-calorie malnutrition ([place])   Total Time spent with patient: 15 minutes  Subjective:   Summary: Alicia [name] is a 71 y.o. year old ftemale currently admitted on the IMTS HD#11 for diffuse abdominal pain of unknown etiology, as well as severely decreased PO intake.  On my exam today the pt reports low appetite for some time. Pt unsure why her appetite is less but does report abd pain and avoidence.  Reports low mood, depression off and on, not most of the day on most days. But does report significant anhedonia. Reports sleep is off and on. Concentration is some less. Denies SI, HI. Denies that anxiety is at a level that impairs her. Denies panic attacks. Denies bipolar manic symptoms or psychosis.   We discussed that mood changes occurred after onset of medical decline.   Past Psychiatric History:  Denies previous psych dx Denies previous psych hosp or suicide attempt Denies current home psych meds PTA Reports taking an unknown psych med for a few weeks after head trauma many years ago  Denies family psych history known to her. Reports niece attempted suicide.   Risk to Self:  denies Risk to Others:  denies Prior Inpatient Therapy:  denies Prior Outpatient Therapy:  denies  Past Medical History:  Past Medical History:  Diagnosis Date   Allergy    Arthritis    Chronic  headaches    Constipation    Depression    Diabetes mellitus    GERD (gastroesophageal reflux disease)    Hyperlipidemia    Hypertension    Obesity    PAD (peripheral artery disease) ([place])    stage 4    Stroke [facility])     Past Surgical History:  Procedure Laterality Date   ABDOMINAL AORTOGRAM W/LOWER EXTREMITY N/A 12/17/2018   Procedure: ABDOMINAL AORTOGRAM W/LOWER EXTREMITY;  Surgeon: Waynetta Sandy, MD;  Location: [place] [facility];  Service: Cardiovascular;  Laterality: N/A;   ARTERY BIOPSY Left 03/17/2022   Procedure: BIOPSY TEMPORAL ARTERY;  Surgeon: Cherre Robins, MD;  Location: [place];  Service: Vascular;  Laterality: Left;   BIOPSY  09/01/2022   Procedure: BIOPSY;  Surgeon: Yetta Flock, MD;  Location: [facility];  Service: Gastroenterology;;   ESOPHAGOGASTRODUODENOSCOPY (EGD) WITH PROPOFOL N/A 09/01/2022   Procedure: ESOPHAGOGASTRODUODENOSCOPY (EGD) WITH PROPOFOL;  Surgeon: Yetta Flock, MD;  Location: [place];  Service: Gastroenterology;  Laterality: N/A;   FEMORAL BYPASS  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 Bridgett Larsson   PERIPHERAL VASCULAR ATHERECTOMY Left 12/17/2018   Procedure: PERIPHERAL VASCULAR ATHERECTOMY;  Surgeon: Waynetta Sandy, MD;  Location: [place] [facility];  Service: Cardiovascular;  Laterality: Left;   PERIPHERAL VASCULAR BALLOON ANGIOPLASTY Left 12/17/2018   Procedure: PERIPHERAL VASCULAR BALLOON ANGIOPLASTY;  Surgeon: Waynetta Sandy, MD;  Location: [place] [facility];  Service: Cardiovascular;  Laterality: Left;   Family History:  Family History  Problem Relation Age of Onset   Diabetes Father    Heart disease Mother        NOT before age 55-  Bypass   Hypertension Mother    Hypertension Sister    Varicose Veins Brother    Heart disease Brother        Before age 51   Hypertension Daughter    Colon cancer Neg Hx    Esophageal cancer Neg Hx    Rectal cancer Neg Hx     Stomach cancer Neg Hx     Social History:  Social History   Substance and Sexual Activity  Alcohol Use Yes   Alcohol/week: 1.0 standard drink of alcohol   Types: 1 Standard drinks or equivalent per week   Comment: occasional use only     Social History   Substance and Sexual Activity  Drug Use No    Social History   Socioeconomic History   Marital status: Single    Spouse name: Not on file   Number of children: Not on file   Years of education: Not on file   Highest education level: Not on file  Occupational History   Not on file  Tobacco Use   Smoking status: Light Smoker    Packs/day: 0.50    Years: 35.00    Total pack years: 17.50    Types: Cigarettes   Smokeless tobacco: Never   Tobacco comments:    patient is trying to quit and has gone down to half a pack per day 1 per day.  Vaping Use   Vaping Use: Never used  Substance and Sexual Activity   Alcohol use: Yes    Alcohol/week: 1.0 standard drink of alcohol    Types: 1 Standard drinks or equivalent per week    Comment: occasional use only   Drug use: No   Sexual activity: Not Currently  Other Topics Concern   Not on file  Social History Narrative   Not on file   Social Determinants of Health   Financial Resource Strain: Not on file  Food Insecurity: No Food Insecurity (09/04/2022)   Hunger Vital Sign    Worried About Running Out of Food in the Last Year: Never true    Ran Out of Food in the Last Year: Never true  Transportation Needs: Unmet Transportation Needs (09/04/2022)   PRAPARE - Hydrologist (Medical): Yes    Lack of Transportation (Non-Medical): Yes  Physical Activity: Inactive (07/28/2021)   Exercise Vital Sign    Days of Exercise per Week: 0 days    Minutes of Exercise per Session: 0 min  Stress: Not on file  Social Connections: Not on file   Additional Social History:    Allergies:   Allergies  Allergen Reactions   Nifedipine Er Other (See Comments)     Caused nose bleeds in higher doses (90 mg., namely)   Latex Rash   Lipitor [Atorvastatin] Swelling and Other (See Comments)    Lip swelling to lipitor Patient tolerates crestor    Labs:  Results for orders placed or performed during the hospital encounter of 08/24/22 (from the past 48 hour(s))  Magnesium     Status: None   Collection Time: 09/08/22  3:59 PM  Result Value Ref Range   Magnesium 2.1 1.7 - 2.4 mg/dL    Comment: Performed at [facility], [place] [address], [place], [place] [address]  Phosphorus     Status:  None   Collection Time: 09/08/22  3:59 PM  Result Value Ref Range   Phosphorus 2.6 2.5 - 4.6 mg/dL    Comment: Performed at [facility], [place] [address] [place], [place] [address]  Glucose, capillary     Status: Abnormal   Collection Time: 09/08/22  4:26 PM  Result Value Ref Range   Glucose-Capillary 240 (H) 70 - 99 mg/dL    Comment: Glucose reference range applies only to samples taken after fasting for at least 8 hours.  Glucose, capillary     Status: Abnormal   Collection Time: 09/08/22  8:23 PM  Result Value Ref Range   Glucose-Capillary 181 (H) 70 - 99 mg/dL    Comment: Glucose reference range applies only to samples taken after fasting for at least 8 hours.  Glucose, capillary     Status: Abnormal   Collection Time: 09/09/22 12:26 AM  Result Value Ref Range   Glucose-Capillary 211 (H) 70 - 99 mg/dL    Comment: Glucose reference range applies only to samples taken after fasting for at least 8 hours.  Glucose, capillary     Status: Abnormal   Collection Time: 09/09/22  4:19 AM  Result Value Ref Range   Glucose-Capillary 200 (H) 70 - 99 mg/dL    Comment: Glucose reference range applies only to samples taken after fasting for at least 8 hours.  Renal function panel     Status: Abnormal   Collection Time: 09/09/22  4:38 AM  Result Value Ref Range   Sodium 137 135 - 145 mmol/L   Potassium 3.5 3.5 - 5.1 mmol/L   Chloride 107 98 - 111 mmol/L    CO2 21 (L) 22 - 32 mmol/L   Glucose, Bld 207 (H) 70 - 99 mg/dL    Comment: Glucose reference range applies only to samples taken after fasting for at least 8 hours.   BUN 44 (H) 8 - 23 mg/dL   Creatinine, Ser 4.21 (H) 0.44 - 1.00 mg/dL   Calcium 7.4 (L) 8.9 - 10.3 mg/dL   Phosphorus 2.2 (L) 2.5 - 4.6 mg/dL   Albumin 1.7 (L) 3.5 - 5.0 g/dL   GFR, Estimated 11 (L) >60 mL/min    Comment: (NOTE) Calculated using the CKD-EPI Creatinine Equation (2021)    Anion gap 9 5 - 15    Comment: Performed at [facility] [address] [place], [place] [address]  CBC     Status: Abnormal   Collection Time: 09/09/22  4:38 AM  Result Value Ref Range   WBC 7.7 4.0 - 10.5 K/uL   RBC 3.50 (L) 3.87 - 5.11 MIL/uL   Hemoglobin 11.3 (L) 12.0 - 15.0 g/dL   HCT 32.5 (L) 36.0 - 46.0 %   MCV 92.9 80.0 - 100.0 fL   MCH 32.3 26.0 - 34.0 pg   MCHC 34.8 30.0 - 36.0 g/dL   RDW 16.6 (H) 11.5 - 15.5 %   Platelets 256 150 - 400 K/uL   nRBC 0.0 0.0 - 0.2 %    Comment: Performed at [facility], [place] [address] [place], [place] [address]  Magnesium     Status: None   Collection Time: 09/09/22  4:38 AM  Result Value Ref Range   Magnesium 2.0 1.7 - 2.4 mg/dL    Comment: Performed at [facility] [address] [place], [place] [address]  Glucose, capillary     Status: Abnormal   Collection Time: 09/09/22  7:56  AM  Result Value Ref Range   Glucose-Capillary 181 (H) 70 - 99 mg/dL    Comment: Glucose reference range applies only to samples taken after fasting for at least 8 hours.  Glucose, capillary     Status: Abnormal   Collection Time: 09/09/22  1:40 PM  Result Value Ref Range   Glucose-Capillary 184 (H) 70 - 99 mg/dL    Comment: Glucose reference range applies only to samples taken after fasting for at least 8 hours.  Glucose, capillary     Status: Abnormal   Collection Time: 09/09/22  5:05 PM  Result Value Ref Range   Glucose-Capillary 182 (H) 70 - 99 mg/dL    Comment: Glucose  reference range applies only to samples taken after fasting for at least 8 hours.  Glucose, capillary     Status: Abnormal   Collection Time: 09/09/22  8:52 PM  Result Value Ref Range   Glucose-Capillary 183 (H) 70 - 99 mg/dL    Comment: Glucose reference range applies only to samples taken after fasting for at least 8 hours.  Glucose, capillary     Status: Abnormal   Collection Time: 09/10/22 12:16 AM  Result Value Ref Range   Glucose-Capillary 195 (H) 70 - 99 mg/dL    Comment: Glucose reference range applies only to samples taken after fasting for at least 8 hours.  Basic metabolic panel     Status: Abnormal   Collection Time: 09/10/22  3:53 AM  Result Value Ref Range   Sodium 137 135 - 145 mmol/L   Potassium 3.9 3.5 - 5.1 mmol/L   Chloride 106 98 - 111 mmol/L   CO2 21 (L) 22 - 32 mmol/L   Glucose, Bld 203 (H) 70 - 99 mg/dL    Comment: Glucose reference range applies only to samples taken after fasting for at least 8 hours.   BUN 43 (H) 8 - 23 mg/dL   Creatinine, Ser 3.98 (H) 0.44 - 1.00 mg/dL   Calcium 7.5 (L) 8.9 - 10.3 mg/dL   GFR, Estimated 11 (L) >60 mL/min    Comment: (NOTE) Calculated using the CKD-EPI Creatinine Equation (2021)    Anion gap 10 5 - 15    Comment: Performed at [facility] [address] [place], [place] [address]  Magnesium     Status: None   Collection Time: 09/10/22  3:53 AM  Result Value Ref Range   Magnesium 2.0 1.7 - 2.4 mg/dL    Comment: Performed at [facility] [address], [place],  [address]  Phosphorus     Status: Abnormal   Collection Time: 09/10/22  3:53 AM  Result Value Ref Range   Phosphorus 2.1 (L) 2.5 - 4.6 mg/dL    Comment: Performed at [facility] [address], [place], [place] [address]  Albumin     Status: Abnormal   Collection Time: 09/10/22  3:53 AM  Result Value Ref Range   Albumin 2.3 (L) 3.5 - 5.0 g/dL    Comment: Performed at [facility], [place] [address] [place], [place] [address]   Glucose, capillary     Status: Abnormal   Collection Time: 09/10/22  4:24 AM  Result Value Ref Range   Glucose-Capillary 197 (H) 70 - 99 mg/dL    Comment: Glucose reference range applies only to samples taken after fasting for at least 8 hours.  Glucose, capillary     Status: Abnormal   Collection Time: 09/10/22  7:34  AM  Result Value Ref Range   Glucose-Capillary 209 (H) 70 - 99 mg/dL    Comment: Glucose reference range applies only to samples taken after fasting for at least 8 hours.  Glucose, capillary     Status: Abnormal   Collection Time: 09/10/22 11:27 AM  Result Value Ref Range   Glucose-Capillary 257 (H) 70 - 99 mg/dL    Comment: Glucose reference range applies only to samples taken after fasting for at least 8 hours.    Current Facility-Administered Medications  Medication Dose Route Frequency Provider Last Rate Last Admin   acetaminophen (TYLENOL) suppository 650 mg  650 mg Rectal Q6H Pham, Minh Q, RPH-CPP       Or   acetaminophen (TYLENOL) 160 MG/5ML solution 1,000 mg  1,000 mg Per Tube Q6H Pham, Minh Q, RPH-CPP   1,000 mg at 09/10/22 1027   amLODipine (NORVASC) tablet 10 mg  10 mg Per Tube Daily Pham, Minh Q, RPH-CPP   10 mg at 09/10/22 1027   aspirin EC tablet 81 mg  81 mg Oral Daily Linus Galas, MD   81 mg at 09/10/22 1402   cefTRIAXone (ROCEPHIN) 1 g in sodium chloride 0.9 % 100 mL IVPB  1 g Intravenous Q24H Reesa Chew, MD       dicyclomine (BENTYL) capsule 10 mg  10 mg Per Tube TID AC & HS Pham, Minh Q, RPH-CPP   10 mg at 09/10/22 1150   DULoxetine (CYMBALTA) DR capsule 30 mg  30 mg Oral BID Nooruddin, Saad, MD   30 mg at 09/10/22 1027   enoxaparin (LOVENOX) injection 40 mg  40 mg Subcutaneous Q24H Velna Ochs, MD   40 mg at 09/09/22 2215   feeding supplement (ENSURE ENLIVE / ENSURE PLUS) liquid 237 mL  237 mL Oral BID BM Lacinda Axon, MD   237 mL at 09/05/22 1415   feeding supplement (OSMOLITE 1.2 CAL) liquid 1,000 mL  1,000 mL Per Tube  Continuous Nooruddin, Marlene Lard, MD 60 mL/hr at 09/10/22 0246 1,000 mL at 98/11/91 4782   folic acid (FOLVITE) tablet 1 mg  1 mg Per Tube Daily Pham, Minh Q, RPH-CPP   1 mg at 09/10/22 1028   free water 300 mL  300 mL Per Tube Q6H Rick Duff, MD       HYDROmorphone (DILAUDID) injection 0.5 mg  0.5 mg Intravenous Q4H PRN Rick Duff, MD   0.5 mg at 09/10/22 1402   insulin aspart (novoLOG) injection 0-5 Units  0-5 Units Subcutaneous QHS Rick Duff, MD       insulin aspart (novoLOG) injection 0-9 Units  0-9 Units Subcutaneous TID WC Rick Duff, MD   5 Units at 09/10/22 1149   lipase/protease/amylase (CREON) capsule 24,000 Units  24,000 Units Oral TID Benancio Deeds, MD       metoprolol tartrate (LOPRESSOR) tablet 50 mg  50 mg Per Tube BID Angelica Pou, MD   50 mg at 09/10/22 1027   mirtazapine (REMERON SOL-TAB) disintegrating tablet 7.5 mg  7.5 mg Oral QHS Nicolis Boody, Ovid Curd, MD       multivitamin liquid 15 mL  15 mL Per Tube Daily Pham, Minh Q, RPH-CPP   15 mL at 09/10/22 1028   pantoprazole (PROTONIX) 2 mg/mL oral suspension 40 mg  40 mg Per Tube BID Pham, Minh Q, RPH-CPP   40 mg at 09/10/22 1028   phenol (CHLORASEPTIC) mouth spray 1 spray  1 spray Mouth/Throat PRN Rick Duff, MD  phenylephrine-shark liver oil-mineral oil-petrolatum (PREPARATION H) rectal ointment 1 Application  1 Application Rectal BID PRN Rick Duff, MD       rosuvastatin (CRESTOR) tablet 10 mg  10 mg Per Tube QPM Pham, Minh Q, RPH-CPP   10 mg at 09/09/22 1945   sodium bicarbonate tablet 650 mg  650 mg Oral BID Gean Quint, MD   650 mg at 09/10/22 1027   sucralfate (CARAFATE) 1 GM/10ML suspension 1 g  1 g Per Tube Q6H Pham, Minh Q, RPH-CPP   1 g at 09/10/22 1027   thiamine (VITAMIN B1) tablet 100 mg  100 mg Per Tube Daily Pham, Minh Q, RPH-CPP   100 mg at 09/10/22 1027   Zinc Oxide (TRIPLE PASTE) 12.8 % ointment   Topical PRN Angelica Pou, MD       zinc sulfate  capsule 220 mg  220 mg Per Tube Daily Pham, Minh Q, RPH-CPP   220 mg at 09/10/22 1027           Psychiatric Specialty Exam:  Presentation  General Appearance:  Appropriate for Environment  Eye Contact: Good  Speech: Slow  Speech Volume: Decreased  Handedness:No data recorded  Mood and Affect  Mood: Depressed  Affect: Constricted   Thought Process  Thought Processes: Linear  Descriptions of Associations:Intact  Orientation:Full (Time, Place and Person)  Thought Content:Logical  History of Schizophrenia/Schizoaffective disorder:No data recorded Duration of Psychotic Symptoms:No data recorded Hallucinations:Hallucinations: None  Ideas of Reference:None  Suicidal Thoughts:Suicidal Thoughts: No  Homicidal Thoughts:Homicidal Thoughts: No   Sensorium  Memory: Immediate Good; Recent Good; Remote Fair  Judgment: Fair  Insight: [place]   Community education officer  Concentration:No data recorded Attention Span: Fair  Recall: [employer] [place] recorded [place] recorded  Psychomotor Activity  Psychomotor Activity:No data recorded  Assets  Assets:No data recorded  Sleep  Sleep: Sleep: Poor   Physical Exam: Physical Exam Vitals reviewed.  Pulmonary:     Effort: Pulmonary effort is normal.    Review of Systems  Psychiatric/Behavioral:  Positive for depression. Negative for hallucinations, substance abuse and suicidal ideas. The patient has insomnia. The patient is not nervous/anxious.    Blood pressure (!) 153/68, pulse 77, temperature 97.9 F (36.6 C), temperature source Oral, resp. rate 17, height '5\' 8"'$  (1.727 m), weight 112.9 kg, SpO2 99 %. Body mass index is 37.86 kg/m.  Treatment Plan Summary:  Assessment: -Major depressive disorder, moderate, w/o psychotic features. Depression could be due to medical illness and/or nutritional deficiencies. Also could have strong psychologic component due to state of medical  illness/decline impairing her to live more independently. It is my thinking that the depression is not causing low appetite or decreased food intake - more likely due to learned avoidence (conditioning from painful stimuli) 2/2 pain or other GI symptoms. Nonetheless, treating the depression with additional medication could be beneficial to her overall quality of life. Pt is agreeable to starting low dose of remeron (as an antidepressant that can help with appetite and sleep).   Plan: -does not require inpt psych admission or 1:1 sitter -Start remeron 7.5 mg qhs for MDD (this is in addition to cymbalta).  -I'm unsure what to do with recommendations for cymbalta. It appears this was started during this admission. This medication could be helpful for mood as a primary antidepressant. I agree with bid dosing if pt is having loose stools and other gi symptoms. Caution with ssri/snri risk and bleeding. Caution with this particular medication and any  liver dysfunction. Caution of this med is making diarrhea worse. Otherwise an SNRI is reasonable option for her as she could benefit from the noradrenergic effects of this medication.  Ultimately if appetite should be stimulated with psychiatric medication, we have other more robust options, ie zyprexa which has a dissolvable tablet - I would start with low dose ie 2.5 mg. But try remeron first, titrate up to 30 mg (not beyond), and if no response, switch to zyprexa.   Disposition: Patient does not meet criteria for psychiatric inpatient admission. Psych service will follow at at distance.   Christoper Allegra, MD 09/10/2022 3:51 PM  Total Time Spent in Direct Patient Care:  I personally spent 45 minutes on the unit in direct patient care. The direct patient care time included face-to-face time with the patient, reviewing the patient's chart, communicating with other professionals, and coordinating care. Greater than 50% of this time was spent in counseling or  coordinating care with the patient regarding goals of hospitalization, psycho-education, and discharge planning needs.   Janine Limbo, MD Psychiatrist

## 2022-09-10 NOTE — Progress Notes (Signed)
Subjective:   Summary: Alicia [name] is a 71 y.o. year old female currently admitted on the IMTS HD#11 for diffuse abdominal pain of unknown etiology, as well as severely decreased PO intake.  Overnight Events: NAEON -Denies any nausea or vomiting yesterday.  Is still having diarrhea which is worsened since starting tube feeds.  Says that she feels somewhat better but still has diffuse abdominal tenderness.  Does not have much of an appetite.  Objective:  Vital signs in last 24 hours: Vitals:   09/09/22 2200 09/10/22 0225 09/10/22 0426 09/10/22 0805  BP:   (!) 165/70 (!) 153/68  Pulse:   76 77  Resp:   18 17  Temp:   98.3 F (36.8 C) 97.9 F (36.6 C)  TempSrc:    Oral  SpO2:   97% 99%  Weight: 112.9 kg 112.9 kg    Height:       Supplemental O2: Room Air SpO2: 99 % O2 Flow Rate (L/min): 2 L/min   Physical Exam:  Constitutional: NAD HEENT: Feeding tube in place Cardiovascular: RRR, no murmurs, rubs, or gallops Abdominal: Bowel sounds present, mild TTP throughout abdomen, some guarding, but no rebound or rigidity. Periumbilical hernia unchanged. Irreducible but not strangulated. Extremities: upper/lower extremity pulses 2+, no lower extremity edema present. Neuro/Psych: Flat affect, interacts and answers questions but is minimally verbal.   Intake/Output Summary (Last 24 hours) at 09/10/2022 1301 Last data filed at 09/10/2022 1240 Gross per 24 hour  Intake 1640 ml  Output 45 ml  Net 1595 ml   Net IO Since Admission: 14,268.71 mL [09/10/22 1301]  Pertinent Labs:    Latest Ref Rng & Units 09/09/2022    4:38 AM 08/31/2022    1:32 AM 08/30/2022    3:32 AM  CBC  WBC 4.0 - 10.5 K/uL 7.7  6.2  6.7   Hemoglobin 12.0 - 15.0 g/dL 11.3  11.3  10.5   Hematocrit 36.0 - 46.0 % 32.5  34.2  31.4   Platelets 150 - 400 K/uL 256  264  227        Latest Ref Rng & Units 09/10/2022    3:53 AM 09/09/2022    4:38 AM 09/08/2022    6:32 AM  CMP  Glucose  70 - 99 mg/dL 203  207  188   BUN 8 - 23 mg/dL 43  44  45   Creatinine 0.44 - 1.00 mg/dL 3.98  4.21  4.29   Sodium 135 - 145 mmol/L 137  137  138   Potassium 3.5 - 5.1 mmol/L 3.9  3.5  3.2   Chloride 98 - 111 mmol/L 106  107  107   CO2 22 - 32 mmol/L '21  21  20   '$ Calcium 8.9 - 10.3 mg/dL 7.5  7.4  7.5    Last 3 CBGs 209, 197, 195   Assessment/Plan:   Principal Problem:   Chronic generalized abdominal pain Active Problems:   Acute worsening of stage 4 chronic kidney disease (HCC)   Generalized weakness   Pressure injury of skin   Nausea and vomiting   Diarrhea   Duodenal ulcer   Mucosal abnormality of stomach   Mucosal abnormality of esophagus   Food aversion   Hypoalbuminemia due to protein-calorie malnutrition [facility])   Patient Summary: CHARLISHA MARKET is a 71 y.o. with a pertinent PMH of HFpEF, HTN, DMII, CKD4, PAD, prior CVA,  who presented with progressive weakness and post-prandial abdominal pain and admitted for decreased functional ability and diffuse abdominal pain of unknown etiology.   #Abdominal Pain of Unkown Etiology #IMA occlusion #Duodenal ulcers #Lack of PO Intake  #Possible food aversion Tube feed is at goal at 13m/hr. Overall, she feels like she is slightly better today but still has diffuse abdominal tenderness and diarrhea likely due to tube feeds as well as pancreatic insufficiency.  She has not had any nausea or vomiting.  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  Prior EGD showed multiple duodenal ulcers.  Gastric emptying study was unable to be obtained because patient did not tolerate required p.o. intake.  Pancreatic fecal elastase did result as decreased.  Etiology of her abdominal symptoms are likely multifactorial due to possible chronic mesenteric ischemia, duodenal ulcers, pancreatic insufficiency.  She also has a very flat affect and endorses chronic appetite suppression and food aversion likely due to her  chronic diarrhea and other abdominal symptoms. Plan:  -We will start ASA 81 and continue Crestor 10 for IMA lesion.  Consider GI/vascular surgery follow-up. - Psych consult for food aversion and depression (follow-up) - We will start Creon for pancreatic insufficiency - Continue tube feeding at 60 mL/hour - Following phosphorous, magnesium, and potassium to monitor for refeeding syndrome  #AKI on CKD4 Creatinine 3.98 today. Nephrology is on board and stated that this could be most likely due to prolonged pre-renal state leading to tubular injury, and have recommended IV hydration, and monitoring daily Cr. At this point, no indication for hemodialysis. - Appreciate nephrology for assistance. - Avoid nephrotoxic medications - IV hydration electrolyte repletion as needed  #UTI Patient's UA and symptoms were likely indicative of a urinary tract infection, and rocephin was started.  -Day 3/5 ceftriaxone - Follow-up cultures cultures   Code: Full Dispo: Anticipated discharge in less than 5 midnights  SLinus GalasMD PGY-1 Internal Medicine Resident Pager: [phone] contact the on call pager after 5 pm and on weekends at [phone]

## 2022-09-11 ENCOUNTER — Inpatient Hospital Stay (HOSPITAL_COMMUNITY): Payer: Medicare HMO

## 2022-09-11 DIAGNOSIS — N390 Urinary tract infection, site not specified: Secondary | ICD-10-CM | POA: Diagnosis not present

## 2022-09-11 DIAGNOSIS — R140 Abdominal distension (gaseous): Secondary | ICD-10-CM | POA: Diagnosis not present

## 2022-09-11 DIAGNOSIS — E876 Hypokalemia: Secondary | ICD-10-CM | POA: Diagnosis not present

## 2022-09-11 DIAGNOSIS — D649 Anemia, unspecified: Secondary | ICD-10-CM | POA: Diagnosis not present

## 2022-09-11 DIAGNOSIS — I130 Hypertensive heart and chronic kidney disease with heart failure and stage 1 through stage 4 chronic kidney disease, or unspecified chronic kidney disease: Secondary | ICD-10-CM | POA: Diagnosis not present

## 2022-09-11 DIAGNOSIS — K269 Duodenal ulcer, unspecified as acute or chronic, without hemorrhage or perforation: Secondary | ICD-10-CM | POA: Diagnosis not present

## 2022-09-11 DIAGNOSIS — R1084 Generalized abdominal pain: Secondary | ICD-10-CM | POA: Diagnosis not present

## 2022-09-11 DIAGNOSIS — R531 Weakness: Secondary | ICD-10-CM | POA: Diagnosis not present

## 2022-09-11 DIAGNOSIS — E1110 Type 2 diabetes mellitus with ketoacidosis without coma: Secondary | ICD-10-CM | POA: Diagnosis not present

## 2022-09-11 DIAGNOSIS — N179 Acute kidney failure, unspecified: Secondary | ICD-10-CM | POA: Diagnosis not present

## 2022-09-11 DIAGNOSIS — G8929 Other chronic pain: Secondary | ICD-10-CM | POA: Diagnosis not present

## 2022-09-11 DIAGNOSIS — N184 Chronic kidney disease, stage 4 (severe): Secondary | ICD-10-CM | POA: Diagnosis not present

## 2022-09-11 DIAGNOSIS — M47816 Spondylosis without myelopathy or radiculopathy, lumbar region: Secondary | ICD-10-CM | POA: Diagnosis not present

## 2022-09-11 DIAGNOSIS — I509 Heart failure, unspecified: Secondary | ICD-10-CM | POA: Diagnosis not present

## 2022-09-11 LAB — CBC
HCT: 36.5 % (ref 36.0–46.0)
Hemoglobin: 11.8 g/dL — ABNORMAL LOW (ref 12.0–15.0)
MCH: 31 pg (ref 26.0–34.0)
MCHC: 32.3 g/dL (ref 30.0–36.0)
MCV: 95.8 fL (ref 80.0–100.0)
Platelets: 269 10*3/uL (ref 150–400)
RBC: 3.81 MIL/uL — ABNORMAL LOW (ref 3.87–5.11)
RDW: 17.2 % — ABNORMAL HIGH (ref 11.5–15.5)
WBC: 6.7 10*3/uL (ref 4.0–10.5)
nRBC: 0 % (ref 0.0–0.2)

## 2022-09-11 LAB — MAGNESIUM: Magnesium: 1.9 mg/dL (ref 1.7–2.4)

## 2022-09-11 LAB — BASIC METABOLIC PANEL
Anion gap: 10 (ref 5–15)
BUN: 42 mg/dL — ABNORMAL HIGH (ref 8–23)
CO2: 21 mmol/L — ABNORMAL LOW (ref 22–32)
Calcium: 7.7 mg/dL — ABNORMAL LOW (ref 8.9–10.3)
Chloride: 107 mmol/L (ref 98–111)
Creatinine, Ser: 3.66 mg/dL — ABNORMAL HIGH (ref 0.44–1.00)
GFR, Estimated: 13 mL/min — ABNORMAL LOW (ref >60)
Glucose, Bld: 236 mg/dL — ABNORMAL HIGH (ref 70–99)
Potassium: 4.1 mmol/L (ref 3.5–5.1)
Sodium: 138 mmol/L (ref 135–145)

## 2022-09-11 LAB — GLUCOSE, CAPILLARY
Glucose-Capillary: 171 mg/dL — ABNORMAL HIGH (ref 70–99)
Glucose-Capillary: 189 mg/dL — ABNORMAL HIGH (ref 70–99)
Glucose-Capillary: 200 mg/dL — ABNORMAL HIGH (ref 70–99)
Glucose-Capillary: 206 mg/dL — ABNORMAL HIGH (ref 70–99)
Glucose-Capillary: 215 mg/dL — ABNORMAL HIGH (ref 70–99)
Glucose-Capillary: 220 mg/dL — ABNORMAL HIGH (ref 70–99)

## 2022-09-11 LAB — PARATHYROID HORMONE, INTACT (NO CA): PTH: 81 pg/mL — ABNORMAL HIGH (ref 15–65)

## 2022-09-11 LAB — PHOSPHORUS: Phosphorus: 2.1 mg/dL — ABNORMAL LOW (ref 2.5–4.6)

## 2022-09-11 MED ORDER — PANCRELIPASE (LIP-PROT-AMYL) 12000-38000 UNITS PO CPEP
24000.0000 [IU] | ORAL_CAPSULE | Freq: Three times a day (TID) | ORAL | Status: DC
  Administered 2022-09-11 – 2022-09-26 (×15): 24000 [IU] via ORAL
  Filled 2022-09-11 (×32): qty 2

## 2022-09-11 MED ORDER — FREE WATER
200.0000 mL | Status: DC
  Administered 2022-09-11 – 2022-09-27 (×67): 200 mL

## 2022-09-11 MED ORDER — DEXTROSE IN LACTATED RINGERS 5 % IV SOLN: INTRAVENOUS | Status: AC

## 2022-09-11 MED ORDER — OSMOLITE 1.2 CAL PO LIQD
1000.0000 mL | ORAL | Status: DC
  Administered 2022-09-11 – 2022-09-13 (×2): 1000 mL
  Filled 2022-09-11 (×2): qty 1000

## 2022-09-11 MED ORDER — METOCLOPRAMIDE HCL 5 MG/ML IJ SOLN
10.0000 mg | Freq: Once | INTRAMUSCULAR | Status: AC
  Administered 2022-09-11: 10 mg via INTRAVENOUS
  Filled 2022-09-11: qty 2

## 2022-09-11 MED ORDER — FREE WATER: 200.0000 mL | Freq: Four times a day (QID) | Status: DC

## 2022-09-11 MED ORDER — INSULIN GLARGINE-YFGN 100 UNIT/ML ~~LOC~~ SOLN
6.0000 [IU] | Freq: Every day | SUBCUTANEOUS | Status: DC
  Administered 2022-09-11 – 2022-09-20 (×10): 6 [IU] via SUBCUTANEOUS
  Filled 2022-09-11 (×13): qty 0.06

## 2022-09-11 NOTE — Progress Notes (Signed)
Subjective:   Summary: Alicia [name] is a 71 y.o. year old female currently admitted on the IMTS HD#18 for diffuse abdominal pain as well as severely decreased PO intake likely multifactorial in etiology due to pancreatic insufficiency, chronic IMA occlusion, duodenal ulcers.  Overnight Events:  -Was evaluated by overnight team for increased abdominal pain and abdominal distention.  She had Flexi-Seal placed but had stopped having stool output.  Endorse some nausea but no vomiting.  Distention active bowel sounds noted on exam but otherwise benign at that time.  Received 1 mg of Dilaudid with some relief, tube feeds were held, also got 10 mg of Reglan for nausea with some symptomatic improvement. - This morning, she endorsed improvement in her abdominal symptoms.  She is still distended with some diffuse abdominal pain and has still not had any bowel movements, compared to continuous diarrhea previously.  Of note, she also started receiving Creon yesterday. - Assessed by psychiatry yesterday.  They recommended initiating Remeron, with possible titration down the road.  Zyprexa is also an option. -Otherwise, she endorses feeling about the same Objective:  Vital signs in last 24 hours: Vitals:   09/10/22 2027 09/11/22 0426 09/11/22 0500 09/11/22 0941  BP: 129/73 (!) 176/74  (!) 185/74  Pulse: 78 67  75  Resp: '18 18  18  '$ Temp: 97.9 F (36.6 C) 98.1 F (36.7 C)  98.9 F (37.2 C)  TempSrc: Oral     SpO2: 99% 96%  98%  Weight:   112 kg   Height:       Supplemental O2: Room Air SpO2: 98 % O2 Flow Rate (L/min): 2 L/min   Physical Exam:  Constitutional: NAD HEENT: Feeding tube in place Cardiovascular: RRR, no murmurs, rubs, or gallops Abdominal: Bowel sounds present, mild TTP throughout abdomen, some guarding, but no rebound or rigidity.  Significantly distended.  Periumbilical hernia unchanged. Irreducible but not strangulated. Extremities: no lower extremity  edema present. Neuro/Psych: Flat affect, interacts and answers questions but is minimally verbal.   Intake/Output Summary (Last 24 hours) at 09/11/2022 1245 Last data filed at 09/11/2022 0600 Gross per 24 hour  Intake 2926.59 ml  Output 45 ml  Net 2881.59 ml    Net IO Since Admission: 17,150.3 mL [09/11/22 1245]  Pertinent Labs:    Latest Ref Rng & Units 09/09/2022    4:38 AM 08/31/2022    1:32 AM 08/30/2022    3:32 AM  CBC  WBC 4.0 - 10.5 K/uL 7.7  6.2  6.7   Hemoglobin 12.0 - 15.0 g/dL 11.3  11.3  10.5   Hematocrit 36.0 - 46.0 % 32.5  34.2  31.4   Platelets 150 - 400 K/uL 256  264  227        Latest Ref Rng & Units 09/11/2022    2:26 AM 09/10/2022    3:53 AM 09/09/2022    4:38 AM  CMP  Glucose 70 - 99 mg/dL 236  203  207   BUN 8 - 23 mg/dL 42  43  44   Creatinine 0.44 - 1.00 mg/dL 3.66  3.98  4.21   Sodium 135 - 145 mmol/L 138  137  137   Potassium 3.5 - 5.1 mmol/L 4.1  3.9  3.5   Chloride 98 - 111 mmol/L 107  106  107   CO2 22 - 32 mmol/L '21  21  21   '$ Calcium  8.9 - 10.3 mg/dL 7.7  7.5  7.4    Last 3 CBGs 171, 200, 206   Assessment/Plan:   Patient Summary: Alicia [name] is a 71 y.o. with a pertinent PMH of HFpEF, HTN, DMII, CKD4, PAD, prior CVA, who presented with progressive weakness and post-prandial abdominal pain and admitted for decreased functional ability, diffuse abdominal pain, and chronic diarrhea likely multifactorial in etiology due to pancreatic insufficiency, IMA occlusion, duodenal ulcers.   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  She experienced increased abdominal pain and abdominal distention last night and she was treated symptomatically and reported  some improvement this morning, though she is still significantly distended.  She was evaluated by psych yesterday and they recommended initiating Remeron.  Creon also started yesterday.  Since symptoms are resolving, will restart tube feeds at a low rate and ramp up again.  Also obtain abdominal imaging to rule out obstruction. Plan:  -Restart tube feeds at 20 mL/h and then ramp back up to goal of 60 mL/h more if she is able to tolerate this - Follow-up KUB -Continue ASA 81 and continue Crestor 10 for IMA lesion.  Consider GI/vascular surgery follow-up to assess for revascularization. -Initiate Remeron 7.5 mg nightly per psych recommendation.  Can titrate or switch to Zyprexa as needed down the line. - Continue Creon for pancreatic insufficiency - Continue Protonix 40 mg twice daily, Carafate 1 g every 6 hours - Following phosphorous, magnesium, and potassium to monitor for refeeding syndrome  #AKI on CKD4 Creatinine down to 3.66 today. Nephrology is on board and stated that this could be most likely due to prolonged pre-renal state leading to tubular injury, and have recommended IV hydration, and monitoring daily Cr. At this point, no indication for hemodialysis. - Appreciate nephrology for assistance. - Avoid nephrotoxic medications - IV hydration electrolyte repletion as needed  #UTI Patient's UA and symptoms were likely indicative of a urinary tract infection, and rocephin was started.  -Day 4/5 ceftriaxone  Code: Full Dispo: Anticipated discharge in less than 5 midnights  Linus Galas MD PGY-1 Internal Medicine Resident Pager: [phone] Please contact the on call pager after 5 pm and on weekends at [phone].

## 2022-09-11 NOTE — Progress Notes (Signed)
Got called to bedside to evaluate as patient has had more abdominal pain and more abdominal distension per nursing. Patient has had mo stool output from rectal tube. Overnight, patient has had 1.'0mg'$  of dilaudid with some relief. Patient also has had concerns of nausea but no vomiting.   PE: Abd: Patient abdomen is distended with normoactive bowel sounds. Non tender with noticeable umbilical hernia.   AP: Will hold tube feeds for now Will hold tylenol, but will still give Carafate Will give 10 mg of Reglan for nausea

## 2022-09-11 NOTE — Progress Notes (Signed)
Nephrology Follow-Up Consult note   Assessment/Recommendations: Alicia [name] is a/an 71 y.o. female with a past medical history significant for CKD 4, PAD, HTN, CHF, DM2, HLD, admitted for AKI, abdominal pain, and poor PO intake.       AKI on CKD 4: BL possibly 2.5-3 but fluctuates. Listed as my clinic patient but has not seen me in 1.5 years. She requested to change doctors but still has no showed to numerous appointments and refused to schedule appointments.  AKI likely dehydration possible ongoing state leading to tubular injury. Also significant voltaren use.  Creatinine is slowly improving -Can continue with IV fluids as needed per primary team -Continue to monitor daily Cr, Dose meds for GFR -Monitor Daily I/Os, Daily weight  -Maintain MAP>65 for optimal renal perfusion.  -Avoid nephrotoxic medications including NSAIDs -Use synthetic opioids (Fentanyl/Dilaudid) if needed -Currently no indication for HD  Given the patient has consistently improving kidney function we will sign off at this time.  I do recommend that she follows up in the outpatient setting.  We have stopped preemptively scheduling the patient for visits given she has no showed numerous times.  She is welcome to call and ask for a visit which will then be scheduled.  We will do this sometime around 2 weeks after discharge.  Abd pain: unknown etiology. NG tube in place. Per primary  UTI: rocephin for 5 days based on UA and symptoms  HTN: BP slightly high. Cont current meds  Hypoalbuminemia: likely multifactorial. Given <2 will supplement w/ 25g x2 doses on 10/20  Possible renal artery stenosis: Likely indicative of underlying vascular disease involving the kidneys.  Does not change her management at this time.   Recommendations conveyed to primary service.    [facility] 09/11/2022 11:43 AM  ___________________________________________________________  CC: AKI,  abdominal pain  Interval History/Subjective: Patient complaining of ongoing abdominal pain.  Denies any other issues.  Creatinine continues to improve  Medications:  Current Facility-Administered Medications  Medication Dose Route Frequency Provider Last Rate Last Admin   acetaminophen (TYLENOL) suppository 650 mg  650 mg Rectal Q6H Pham, Minh Q, RPH-CPP       Or   acetaminophen (TYLENOL) 160 MG/5ML solution 1,000 mg  1,000 mg Per Tube Q6H Pham, Minh Q, RPH-CPP   1,000 mg at 09/11/22 1009   amLODipine (NORVASC) tablet 10 mg  10 mg Per Tube Daily Pham, Minh Q, RPH-CPP   10 mg at 09/11/22 1010   aspirin EC tablet 81 mg  81 mg Oral Daily Linus Galas, MD   81 mg at 09/11/22 1010   cefTRIAXone (ROCEPHIN) 1 g in sodium chloride 0.9 % 100 mL IVPB  1 g Intravenous Q24H Reesa Chew, MD 200 mL/hr at 09/10/22 1710 1 g at 09/10/22 1710   dicyclomine (BENTYL) capsule 10 mg  10 mg Per Tube TID AC & HS Pham, Minh Q, RPH-CPP   10 mg at 09/11/22 1010   DULoxetine (CYMBALTA) DR capsule 30 mg  30 mg Oral BID Nooruddin, Saad, MD   30 mg at 09/11/22 1009   enoxaparin (LOVENOX) injection 40 mg  40 mg Subcutaneous Q24H Velna Ochs, MD   40 mg at 09/10/22 2043   feeding supplement (ENSURE ENLIVE / ENSURE PLUS) liquid 237 mL  237 mL Oral BID BM Lacinda Axon, MD   237 mL at 96/78/93 8101   folic acid (FOLVITE) tablet 1 mg  1 mg Per Tube Daily Pham, [place], RPH-CPP  1 mg at 09/11/22 1010   free water 200 mL  200 mL Per Tube Q4H Rick Duff, MD       HYDROmorphone (DILAUDID) injection 0.5 mg  0.5 mg Intravenous Q4H PRN Rick Duff, MD   0.5 mg at 09/11/22 0148   insulin aspart (novoLOG) injection 0-5 Units  0-5 Units Subcutaneous Q4H Leigh Aurora, DO   2 Units at 09/11/22 4401   insulin glargine-yfgn (SEMGLEE) injection 6 Units  6 Units Subcutaneous QHS Rick Duff, MD       lipase/protease/amylase (CREON) capsule 24,000 Units  24,000 Units Oral TID Rick Duff, MD        metoprolol tartrate (LOPRESSOR) tablet 50 mg  50 mg Per Tube BID Angelica Pou, MD   50 mg at 09/11/22 1010   mirtazapine (REMERON SOL-TAB) disintegrating tablet 7.5 mg  7.5 mg Oral QHS Massengill, Ovid Curd, MD   7.5 mg at 09/10/22 2218   multivitamin liquid 15 mL  15 mL Per Tube Daily Pham, Minh Q, RPH-CPP   15 mL at 09/11/22 1010   pantoprazole (PROTONIX) 2 mg/mL oral suspension 40 mg  40 mg Per Tube BID Pham, Minh Q, RPH-CPP   40 mg at 09/11/22 1010   phenol (CHLORASEPTIC) mouth spray 1 spray  1 spray Mouth/Throat PRN Rick Duff, MD       phenylephrine-shark liver oil-mineral oil-petrolatum (PREPARATION H) rectal ointment 1 Application  1 Application Rectal BID PRN Rick Duff, MD       rosuvastatin (CRESTOR) tablet 10 mg  10 mg Per Tube QPM Pham, Minh Q, RPH-CPP   10 mg at 09/10/22 1705   sodium bicarbonate tablet 650 mg  650 mg Oral BID Gean Quint, MD   650 mg at 09/11/22 1010   sucralfate (CARAFATE) 1 GM/10ML suspension 1 g  1 g Per Tube Q6H Pham, Minh Q, RPH-CPP   1 g at 09/11/22 1011   thiamine (VITAMIN B1) tablet 100 mg  100 mg Per Tube Daily Pham, Minh Q, RPH-CPP   100 mg at 09/11/22 1009   Zinc Oxide (TRIPLE PASTE) 12.8 % ointment   Topical PRN Angelica Pou, MD   Given at 09/11/22 0148   zinc sulfate capsule 220 mg  220 mg Per Tube Daily Pham, Minh Q, RPH-CPP   220 mg at 09/11/22 1011      Review of Systems: 10 systems reviewed and negative except per interval history/subjective  Physical Exam: Vitals:   09/11/22 0426 09/11/22 0941  BP: (!) 176/74 (!) 185/74  Pulse: 67 75  Resp: 18 18  Temp: 98.1 F (36.7 C) 98.9 F (37.2 C)  SpO2: 96% 98%   No intake/output data recorded.  Intake/Output Summary (Last 24 hours) at 09/11/2022 1143 Last data filed at 09/11/2022 0600 Gross per 24 hour  Intake 2926.59 ml  Output 90 ml  Net 2836.59 ml   Constitutional: Tired and ill-appearing, lying in bed, no distress ENMT: ears and nose without scars or  lesions, MMM CV: normal rate, no edema Respiratory: Bilateral chest rise, no increased work of breathing Gastrointestinal: Moderate distention, mild tenderness to palpation Skin: no visible lesions or rashes Psych: Minimal interaction, depressed mood, blunted affect   Test Results I personally reviewed new and old clinical labs and radiology tests Lab Results  Component Value Date   NA 138 09/11/2022   K 4.1 09/11/2022   CL 107 09/11/2022   CO2 21 (L) 09/11/2022   BUN 42 (H) 09/11/2022   CREATININE 3.66 (H) 09/11/2022  CALCIUM 7.7 (L) 09/11/2022   ALBUMIN 2.3 (L) 09/10/2022   PHOS 2.1 (L) 09/10/2022    CBC Recent Labs  Lab 09/09/22 0438  WBC 7.7  HGB 11.3*  HCT 32.5*  MCV 92.9  PLT 256

## 2022-09-12 DIAGNOSIS — K529 Noninfective gastroenteritis and colitis, unspecified: Secondary | ICD-10-CM | POA: Diagnosis not present

## 2022-09-12 DIAGNOSIS — K269 Duodenal ulcer, unspecified as acute or chronic, without hemorrhage or perforation: Secondary | ICD-10-CM | POA: Diagnosis not present

## 2022-09-12 DIAGNOSIS — G8929 Other chronic pain: Secondary | ICD-10-CM | POA: Diagnosis not present

## 2022-09-12 DIAGNOSIS — R1084 Generalized abdominal pain: Secondary | ICD-10-CM | POA: Diagnosis not present

## 2022-09-12 LAB — COMPREHENSIVE METABOLIC PANEL
ALT: 149 U/L — ABNORMAL HIGH (ref 0–44)
AST: 137 U/L — ABNORMAL HIGH (ref 15–41)
Albumin: 1.9 g/dL — ABNORMAL LOW (ref 3.5–5.0)
Alkaline Phosphatase: 578 U/L — ABNORMAL HIGH (ref 38–126)
Anion gap: 12 (ref 5–15)
BUN: 45 mg/dL — ABNORMAL HIGH (ref 8–23)
CO2: 22 mmol/L (ref 22–32)
Calcium: 7.5 mg/dL — ABNORMAL LOW (ref 8.9–10.3)
Chloride: 102 mmol/L (ref 98–111)
Creatinine, Ser: 3.72 mg/dL — ABNORMAL HIGH (ref 0.44–1.00)
GFR, Estimated: 12 mL/min — ABNORMAL LOW (ref >60)
Glucose, Bld: 203 mg/dL — ABNORMAL HIGH (ref 70–99)
Potassium: 4.2 mmol/L (ref 3.5–5.1)
Sodium: 136 mmol/L (ref 135–145)
Total Bilirubin: 0.7 mg/dL (ref 0.3–1.2)
Total Protein: 5 g/dL — ABNORMAL LOW (ref 6.5–8.1)

## 2022-09-12 LAB — CBC
HCT: 32.7 % — ABNORMAL LOW (ref 36.0–46.0)
Hemoglobin: 11.2 g/dL — ABNORMAL LOW (ref 12.0–15.0)
MCH: 31.4 pg (ref 26.0–34.0)
MCHC: 34.3 g/dL (ref 30.0–36.0)
MCV: 91.6 fL (ref 80.0–100.0)
Platelets: 264 10*3/uL (ref 150–400)
RBC: 3.57 MIL/uL — ABNORMAL LOW (ref 3.87–5.11)
RDW: 17.1 % — ABNORMAL HIGH (ref 11.5–15.5)
WBC: 7.4 10*3/uL (ref 4.0–10.5)
nRBC: 0 % (ref 0.0–0.2)

## 2022-09-12 LAB — GLUCOSE, CAPILLARY
Glucose-Capillary: 156 mg/dL — ABNORMAL HIGH (ref 70–99)
Glucose-Capillary: 167 mg/dL — ABNORMAL HIGH (ref 70–99)
Glucose-Capillary: 170 mg/dL — ABNORMAL HIGH (ref 70–99)
Glucose-Capillary: 172 mg/dL — ABNORMAL HIGH (ref 70–99)
Glucose-Capillary: 184 mg/dL — ABNORMAL HIGH (ref 70–99)
Glucose-Capillary: 204 mg/dL — ABNORMAL HIGH (ref 70–99)

## 2022-09-12 LAB — MAGNESIUM: Magnesium: 2 mg/dL (ref 1.7–2.4)

## 2022-09-12 LAB — PHOSPHORUS: Phosphorus: 2.3 mg/dL — ABNORMAL LOW (ref 2.5–4.6)

## 2022-09-12 MED ORDER — ORAL CARE MOUTH RINSE
15.0000 mL | OROMUCOSAL | Status: DC
  Administered 2022-09-12 – 2022-10-01 (×65): 15 mL via OROMUCOSAL

## 2022-09-12 MED ORDER — ORAL CARE MOUTH RINSE: 15.0000 mL | OROMUCOSAL | Status: DC | PRN

## 2022-09-12 NOTE — Progress Notes (Signed)
Subjective:   Summary: Alicia [name] is a 71 y.o. year old female currently admitted on the IMTS HD#18 for diffuse abdominal pain as well as severely decreased PO intake likely multifactorial in etiology due to pancreatic insufficiency, chronic IMA occlusion, duodenal ulcers.  Overnight Events:   -NAEON. Restarted tube feeds at low rate - no n/v -having bowel movements again  Objective:  Vital signs in last 24 hours: Vitals:   09/11/22 2033 09/12/22 0430 09/12/22 0447 09/12/22 0730  BP: (!) 150/68 (!) 148/65  118/68  Pulse: 73 65  75  Resp: 18 17  15   Temp: 98.3 F (36.8 C) 98.2 F (36.8 C)  97.8 F (36.6 C)  TempSrc:    Axillary  SpO2: 95% 96%  95%  Weight:   112 kg   Height:       Supplemental O2: Room Air SpO2: 95 % O2 Flow Rate (L/Alicia): 2 L/Alicia   Physical Exam:  Constitutional: NAD HEENT: Feeding tube in place Cardiovascular: RRR, no murmurs, rubs, or gallops Abdominal: Bowel sounds present, mild TTP throughout abdomen, some guarding, but no rebound or rigidity.  Distended but soft.  Periumbilical hernia unchanged. Irreducible but not strangulated. Extremities: no lower extremity edema present. Neuro/Psych: Flat affect, interacts and answers questions but is minimally verbal.   Intake/Output Summary (Last 24 hours) at 09/12/2022 1106 Last data filed at 09/12/2022 0600 Gross per 24 hour  Intake 1442.33 ml  Output 90 ml  Net 1352.33 ml   Net IO Since Admission: 18,562.63 mL [09/12/22 1106]  Pertinent Labs:    Latest Ref Rng & Units 09/12/2022    6:34 AM 09/11/2022    7:48 PM 09/09/2022    4:38 AM  CBC  WBC 4.0 - 10.5 K/uL 7.4  6.7  7.7   Hemoglobin 12.0 - 15.0 g/dL 11.2  11.8  11.3   Hematocrit 36.0 - 46.0 % 32.7  36.5  32.5   Platelets 150 - 400 K/uL 264  269  256        Latest Ref Rng & Units 09/12/2022    6:34 AM 09/11/2022    2:26 AM 09/10/2022    3:53 AM  CMP  Glucose 70 - 99 mg/dL 203  236  203   BUN 8 - 23  mg/dL 45  42  43   Creatinine 0.44 - 1.00 mg/dL 3.72  3.66  3.98   Sodium 135 - 145 mmol/L 136  138  137   Potassium 3.5 - 5.1 mmol/L 4.2  4.1  3.9   Chloride 98 - 111 mmol/L 102  107  106   CO2 22 - 32 mmol/L 22  21  21    Calcium 8.9 - 10.3 mg/dL 7.5  7.7  7.5   Total Protein 6.5 - 8.1 g/dL 5.0     Total Bilirubin 0.3 - 1.2 mg/dL 0.7     Alkaline Phos 38 - 126 U/L 578     AST 15 - 41 U/L 137     ALT 0 - 44 U/L 149     Phos 2.3, Mag 2.0 Last 3 CBGs 156, 172, 189   Assessment/Plan:   Patient Summary: Alicia [name] is a 71 y.o. with a pertinent PMH of HFpEF, HTN, DMII, CKD4, PAD, prior CVA, who presented with progressive weakness and post-prandial abdominal pain and admitted for decreased functional ability, diffuse abdominal pain, and chronic diarrhea likely multifactorial in  etiology due to pancreatic insufficiency, IMA occlusion, duodenal ulcers.   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  Was able to tolerate starting tube feeds yesterday.  On medical management for pancreatic insufficiency, IMA occlusion, duodenal ulcers, as well as Remeron added by psych.  States that she has less abdominal pain today, but in general feels about the same.  Endorses some nausea but no vomiting.  She does have significant alk phos, AST/ALT elevation on CMP today as well as decrease in albumin.  Given her multiple other abdominal disorders, will hold off on pursuing right upper quadrant work-up until she is more stable. Plan:  -Continue tube feeds at 20 mL/hour for 24 hours before advancing -Continue ASA 81 and continue Crestor 10 for IMA lesion.  Consult vascular surgery to assess for revascularization. -Continue  Remeron 7.5 mg nightly per psych recommendation.  Can titrate or switch to Zyprexa as needed down the line. - Continue Creon for pancreatic insufficiency - Continue Protonix 40 mg twice daily, Carafate 1 g every 6 hours - Following phosphorous, magnesium, and potassium to monitor for refeeding syndrome - Remove Flexi-Seal  #AKI on CKD4 Creatinine 3.72 today. Nephrology is on board and stated that this could be most likely due to prolonged pre-renal state leading to tubular injury, and have recommended IV hydration, and monitoring daily Cr. At this point, no indication for hemodialysis. - Appreciate nephrology for assistance. - Avoid nephrotoxic medications - IV hydration, electrolyte repletion as needed  #UTI Patient's UA and symptoms were likely indicative of a urinary tract infection, and rocephin was started.  -Day 5/5 ceftriaxone  Code: Full Dispo: Anticipated discharge in less than 5 midnights  Linus Galas MD PGY-1 Internal Medicine Resident Pager: [phone] Please contact the on call pager after 5 pm and on weekends at [phone].

## 2022-09-12 NOTE — Consult Note (Signed)
Face-to-Face Psychiatry Consult   Reason for Consult:  food aversion and depression Referring Physician:  S. Nooruddin  Patient Identification: Alicia [name] MRN:  [id number] Principal Diagnosis: Chronic generalized abdominal pain Diagnosis:  Principal Problem:   Chronic generalized abdominal pain Active Problems:   Acute worsening of stage 4 chronic kidney disease (HCC)   Generalized weakness   Pressure injury of skin   Nausea and vomiting   Diarrhea   Duodenal ulcer   Mucosal abnormality of stomach   Mucosal abnormality of esophagus   Food aversion   Hypoalbuminemia due to protein-calorie malnutrition ([place])  Summary: Alicia [name] is a 71 y.o. year old female currently admitted on the IMTS HD#11 for diffuse abdominal pain of unknown etiology, as well as severely decreased PO intake.  Total Time spent with patient: 15 minutes  Subjective:   Seen and assessed at bedside. Reports significant abdominal pain that is contributing to her lack of appetite. Denies SI/HI/AVH. Denies significant side effects from remeron initiation.  Past Psychiatric History:  Denies previous psych dx Denies previous psych hosp or suicide attempt Denies current home psych meds PTA Reports taking an unknown psych med for a few weeks after head trauma many years ago  Denies family psych history known to her. Reports niece attempted suicide.   Risk to Self:  denies Risk to Others:  denies Prior Inpatient Therapy:  denies Prior Outpatient Therapy:  denies  Past Medical History:  Past Medical History:  Diagnosis Date   Allergy    Arthritis    Chronic headaches    Constipation    Depression    Diabetes mellitus    GERD (gastroesophageal reflux disease)    Hyperlipidemia    Hypertension    Obesity    PAD (peripheral artery disease) ([place])    stage 4    Stroke [facility])     Past Surgical History:  Procedure Laterality Date   ABDOMINAL AORTOGRAM W/LOWER EXTREMITY N/A 12/17/2018    Procedure: ABDOMINAL AORTOGRAM W/LOWER EXTREMITY;  Surgeon: Waynetta Sandy, MD;  Location: [place] [facility];  Service: Cardiovascular;  Laterality: N/A;   ARTERY BIOPSY Left 03/17/2022   Procedure: BIOPSY TEMPORAL ARTERY;  Surgeon: Cherre Robins, MD;  Location: [place];  Service: Vascular;  Laterality: Left;   BIOPSY  09/01/2022   Procedure: BIOPSY;  Surgeon: Yetta Flock, MD;  Location: [facility];  Service: Gastroenterology;;   ESOPHAGOGASTRODUODENOSCOPY (EGD) WITH PROPOFOL N/A 09/01/2022   Procedure: ESOPHAGOGASTRODUODENOSCOPY (EGD) WITH PROPOFOL;  Surgeon: Yetta Flock, MD;  Location: [place];  Service: Gastroenterology;  Laterality: N/A;   FEMORAL BYPASS  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 Bridgett Larsson   PERIPHERAL VASCULAR ATHERECTOMY Left 12/17/2018   Procedure: PERIPHERAL VASCULAR ATHERECTOMY;  Surgeon: Waynetta Sandy, MD;  Location: [place] [facility];  Service: Cardiovascular;  Laterality: Left;   PERIPHERAL VASCULAR BALLOON ANGIOPLASTY Left 12/17/2018   Procedure: PERIPHERAL VASCULAR BALLOON ANGIOPLASTY;  Surgeon: Waynetta Sandy, MD;  Location: [place] [facility];  Service: Cardiovascular;  Laterality: Left;   Family History:  Family History  Problem Relation Age of Onset   Diabetes Father    Heart disease Mother        NOT before age 55-  Bypass   Hypertension Mother    Hypertension Sister    Varicose Veins Brother    Heart disease Brother        Before age 14   Hypertension Daughter  Colon cancer Neg Hx    Esophageal cancer Neg Hx    Rectal cancer Neg Hx    Stomach cancer Neg Hx     Social History:  Social History   Substance and Sexual Activity  Alcohol Use Yes   Alcohol/week: 1.0 standard drink of alcohol   Types: 1 Standard drinks or equivalent per week   Comment: occasional use only     Social History   Substance and Sexual Activity  Drug Use No    Social History    Socioeconomic History   Marital status: Single    Spouse name: Not on file   Number of children: Not on file   Years of education: Not on file   Highest education level: Not on file  Occupational History   Not on file  Tobacco Use   Smoking status: Light Smoker    Packs/day: 0.50    Years: 35.00    Total pack years: 17.50    Types: Cigarettes   Smokeless tobacco: Never   Tobacco comments:    patient is trying to quit and has gone down to half a pack per day 1 per day.  Vaping Use   Vaping Use: Never used  Substance and Sexual Activity   Alcohol use: Yes    Alcohol/week: 1.0 standard drink of alcohol    Types: 1 Standard drinks or equivalent per week    Comment: occasional use only   Drug use: No   Sexual activity: Not Currently  Other Topics Concern   Not on file  Social History Narrative   Not on file   Social Determinants of Health   Financial Resource Strain: Not on file  Food Insecurity: No Food Insecurity (09/04/2022)   Hunger Vital Sign    Worried About Running Out of Food in the Last Year: Never true    Ran Out of Food in the Last Year: Never true  Transportation Needs: Unmet Transportation Needs (09/04/2022)   PRAPARE - Hydrologist (Medical): Yes    Lack of Transportation (Non-Medical): Yes  Physical Activity: Inactive (07/28/2021)   Exercise Vital Sign    Days of Exercise per Week: 0 days    Minutes of Exercise per Session: 0 min  Stress: Not on file  Social Connections: Not on file   Additional Social History:    Allergies:   Allergies  Allergen Reactions   Nifedipine Er Other (See Comments)    Caused nose bleeds in higher doses (90 mg., namely)   Latex Rash   Lipitor [Atorvastatin] Swelling and Other (See Comments)    Lip swelling to lipitor Patient tolerates crestor    Labs:  Results for orders placed or performed during the hospital encounter of 08/24/22 (from the past 48 hour(s))  Glucose, capillary      Status: Abnormal   Collection Time: 09/10/22 11:27 AM  Result Value Ref Range   Glucose-Capillary 257 (H) 70 - 99 mg/dL    Comment: Glucose reference range applies only to samples taken after fasting for at least 8 hours.  Glucose, capillary     Status: Abnormal   Collection Time: 09/10/22  4:45 PM  Result Value Ref Range   Glucose-Capillary 185 (H) 70 - 99 mg/dL    Comment: Glucose reference range applies only to samples taken after fasting for at least 8 hours.  Glucose, capillary     Status: Abnormal   Collection Time: 09/10/22  8:26 PM  Result Value Ref Range  Glucose-Capillary 216 (H) 70 - 99 mg/dL    Comment: Glucose reference range applies only to samples taken after fasting for at least 8 hours.  Glucose, capillary     Status: Abnormal   Collection Time: 09/11/22 12:24 AM  Result Value Ref Range   Glucose-Capillary 215 (H) 70 - 99 mg/dL    Comment: Glucose reference range applies only to samples taken after fasting for at least 8 hours.  Basic metabolic panel     Status: Abnormal   Collection Time: 09/11/22  2:26 AM  Result Value Ref Range   Sodium 138 135 - 145 mmol/L   Potassium 4.1 3.5 - 5.1 mmol/L   Chloride 107 98 - 111 mmol/L   CO2 21 (L) 22 - 32 mmol/L   Glucose, Bld 236 (H) 70 - 99 mg/dL    Comment: Glucose reference range applies only to samples taken after fasting for at least 8 hours.   BUN 42 (H) 8 - 23 mg/dL   Creatinine, Ser 3.66 (H) 0.44 - 1.00 mg/dL   Calcium 7.7 (L) 8.9 - 10.3 mg/dL   GFR, Estimated 13 (L) >60 mL/min    Comment: (NOTE) Calculated using the CKD-EPI Creatinine Equation (2021)    Anion gap 10 5 - 15    Comment: Performed at [facility] [address], [place], [place] [address]  Glucose, capillary     Status: Abnormal   Collection Time: 09/11/22  4:24 AM  Result Value Ref Range   Glucose-Capillary 206 (H) 70 - 99 mg/dL    Comment: Glucose reference range applies only to samples taken after fasting for at least 8 hours.   Glucose, capillary     Status: Abnormal   Collection Time: 09/11/22  7:40 AM  Result Value Ref Range   Glucose-Capillary 200 (H) 70 - 99 mg/dL    Comment: Glucose reference range applies only to samples taken after fasting for at least 8 hours.  Glucose, capillary     Status: Abnormal   Collection Time: 09/11/22 11:33 AM  Result Value Ref Range   Glucose-Capillary 171 (H) 70 - 99 mg/dL    Comment: Glucose reference range applies only to samples taken after fasting for at least 8 hours.  Glucose, capillary     Status: Abnormal   Collection Time: 09/11/22  4:52 PM  Result Value Ref Range   Glucose-Capillary 220 (H) 70 - 99 mg/dL    Comment: Glucose reference range applies only to samples taken after fasting for at least 8 hours.  Magnesium     Status: None   Collection Time: 09/11/22  7:48 PM  Result Value Ref Range   Magnesium 1.9 1.7 - 2.4 mg/dL    Comment: Performed at [facility], [place] [address] [place], [place] [address]  Phosphorus     Status: Abnormal   Collection Time: 09/11/22  7:48 PM  Result Value Ref Range   Phosphorus 2.1 (L) 2.5 - 4.6 mg/dL    Comment: Performed at [facility] [address] [place] [address]  CBC     Status: Abnormal   Collection Time: 09/11/22  7:48 PM  Result Value Ref Range   WBC 6.7 4.0 - 10.5 K/uL   RBC 3.81 (L) 3.87 - 5.11 MIL/uL   Hemoglobin 11.8 (L) 12.0 - 15.0 g/dL   HCT 36.5 36.0 - 46.0 %   MCV 95.8 80.0 - 100.0 fL   MCH 31.0 26.0 - 34.0 pg  MCHC 32.3 30.0 - 36.0 g/dL   RDW 17.2 (H) 11.5 - 15.5 %   Platelets 269 150 - 400 K/uL   nRBC 0.0 0.0 - 0.2 %    Comment: Performed at [facility], [place] [address] [place], [place] [address]  Glucose, capillary     Status: Abnormal   Collection Time: 09/11/22  8:32 PM  Result Value Ref Range   Glucose-Capillary 189 (H) 70 - 99 mg/dL    Comment: Glucose reference range applies only to samples taken after fasting for at least 8 hours.  Glucose, capillary      Status: Abnormal   Collection Time: 09/12/22 12:30 AM  Result Value Ref Range   Glucose-Capillary 172 (H) 70 - 99 mg/dL    Comment: Glucose reference range applies only to samples taken after fasting for at least 8 hours.  Glucose, capillary     Status: Abnormal   Collection Time: 09/12/22  4:30 AM  Result Value Ref Range   Glucose-Capillary 156 (H) 70 - 99 mg/dL    Comment: Glucose reference range applies only to samples taken after fasting for at least 8 hours.  Magnesium     Status: None   Collection Time: 09/12/22  6:34 AM  Result Value Ref Range   Magnesium 2.0 1.7 - 2.4 mg/dL    Comment: Performed at [facility] [address] [place],  [address]  Phosphorus     Status: Abnormal   Collection Time: 09/12/22  6:34 AM  Result Value Ref Range   Phosphorus 2.3 (L) 2.5 - 4.6 mg/dL    Comment: Performed at [facility] [address] [place], [place] [address]  CBC     Status: Abnormal   Collection Time: 09/12/22  6:34 AM  Result Value Ref Range   WBC 7.4 4.0 - 10.5 K/uL   RBC 3.57 (L) 3.87 - 5.11 MIL/uL   Hemoglobin 11.2 (L) 12.0 - 15.0 g/dL   HCT 32.7 (L) 36.0 - 46.0 %   MCV 91.6 80.0 - 100.0 fL   MCH 31.4 26.0 - 34.0 pg   MCHC 34.3 30.0 - 36.0 g/dL   RDW 17.1 (H) 11.5 - 15.5 %   Platelets 264 150 - 400 K/uL   nRBC 0.0 0.0 - 0.2 %    Comment: Performed at [facility], [place] [address], [place], [place] [address]  Comprehensive metabolic panel     Status: Abnormal   Collection Time: 09/12/22  6:34 AM  Result Value Ref Range   Sodium 136 135 - 145 mmol/L   Potassium 4.2 3.5 - 5.1 mmol/L   Chloride 102 98 - 111 mmol/L   CO2 22 22 - 32 mmol/L   Glucose, Bld 203 (H) 70 - 99 mg/dL    Comment: Glucose reference range applies only to samples taken after fasting for at least 8 hours.   BUN 45 (H) 8 - 23 mg/dL   Creatinine, Ser 3.72 (H) 0.44 - 1.00 mg/dL   Calcium 7.5 (L) 8.9 - 10.3 mg/dL   Total Protein 5.0 (L) 6.5 - 8.1 g/dL   Albumin 1.9 (L) 3.5 -  5.0 g/dL   AST 137 (H) 15 - 41 U/L   ALT 149 (H) 0 - 44 U/L   Alkaline Phosphatase 578 (H) 38 - 126 U/L   Total Bilirubin 0.7 0.3 - 1.2 mg/dL   GFR, Estimated 12 (L) >60 mL/min    Comment: (NOTE) Calculated using the CKD-EPI Creatinine  Equation (2021)    Anion gap 12 5 - 15    Comment: Performed at [facility], [place] [address] [place], [place] [address]  Glucose, capillary     Status: Abnormal   Collection Time: 09/12/22  7:31 AM  Result Value Ref Range   Glucose-Capillary 184 (H) 70 - 99 mg/dL    Comment: Glucose reference range applies only to samples taken after fasting for at least 8 hours.    Current Facility-Administered Medications  Medication Dose Route Frequency Provider Last Rate Last Admin   acetaminophen (TYLENOL) suppository 650 mg  650 mg Rectal Q6H Pham, Minh Q, RPH-CPP       Or   acetaminophen (TYLENOL) 160 MG/5ML solution 1,000 mg  1,000 mg Per Tube Q6H Pham, Minh Q, RPH-CPP   1,000 mg at 09/12/22 0610   amLODipine (NORVASC) tablet 10 mg  10 mg Per Tube Daily Pham, Minh Q, RPH-CPP   10 mg at 09/11/22 1010   aspirin EC tablet 81 mg  81 mg Oral Daily Linus Galas, MD   81 mg at 09/11/22 1010   cefTRIAXone (ROCEPHIN) 1 g in sodium chloride 0.9 % 100 mL IVPB  1 g Intravenous Q24H Reesa Chew, MD 200 mL/hr at 09/11/22 1803 1 g at 09/11/22 1803   dicyclomine (BENTYL) capsule 10 mg  10 mg Per Tube TID AC & HS Pham, Minh Q, RPH-CPP   10 mg at 09/11/22 2215   DULoxetine (CYMBALTA) DR capsule 30 mg  30 mg Oral BID Nooruddin, Saad, MD   30 mg at 09/11/22 2216   enoxaparin (LOVENOX) injection 40 mg  40 mg Subcutaneous Q24H Velna Ochs, MD   40 mg at 09/11/22 2041   feeding supplement (OSMOLITE 1.2 CAL) liquid 1,000 mL  1,000 mL Per Tube Continuous Rick Duff, MD 20 mL/hr at 09/11/22 1353 1,000 mL at 09/62/83 6629   folic acid (FOLVITE) tablet 1 mg  1 mg Per Tube Daily Pham, Minh Q, RPH-CPP   1 mg at 09/11/22 1010   free water 200 mL  200 mL Per  Tube Q4H Rick Duff, MD   200 mL at 09/12/22 0446   HYDROmorphone (DILAUDID) injection 0.5 mg  0.5 mg Intravenous Q4H PRN Rick Duff, MD   0.5 mg at 09/12/22 0301   insulin aspart (novoLOG) injection 0-5 Units  0-5 Units Subcutaneous Q4H Leigh Aurora, DO   2 Units at 09/11/22 1754   insulin glargine-yfgn (SEMGLEE) injection 6 Units  6 Units Subcutaneous QHS Rick Duff, MD   6 Units at 09/11/22 2215   lipase/protease/amylase (CREON) capsule 24,000 Units  24,000 Units Oral TID Rick Duff, MD   24,000 Units at 09/11/22 2215   metoprolol tartrate (LOPRESSOR) tablet 50 mg  50 mg Per Tube BID Angelica Pou, MD   50 mg at 09/11/22 2215   mirtazapine (REMERON SOL-TAB) disintegrating tablet 7.5 mg  7.5 mg Oral QHS Massengill, Ovid Curd, MD   7.5 mg at 09/11/22 2215   multivitamin liquid 15 mL  15 mL Per Tube Daily Pham, Minh Q, RPH-CPP   15 mL at 09/11/22 1010   Oral care mouth rinse  15 mL Mouth Rinse 4 times per day Axel Filler, MD       Oral care mouth rinse  15 mL Mouth Rinse PRN Axel Filler, MD       pantoprazole (PROTONIX) 2 mg/mL oral suspension 40 mg  40 mg Per Tube BID Pham, Minh Q, RPH-CPP   40  mg at 09/11/22 2216   phenol (CHLORASEPTIC) mouth spray 1 spray  1 spray Mouth/Throat PRN Rick Duff, MD       phenylephrine-shark liver oil-mineral oil-petrolatum (PREPARATION H) rectal ointment 1 Application  1 Application Rectal BID PRN Rick Duff, MD       rosuvastatin (CRESTOR) tablet 10 mg  10 mg Per Tube QPM Pham, Minh Q, RPH-CPP   10 mg at 09/11/22 1754   sodium bicarbonate tablet 650 mg  650 mg Oral BID Gean Quint, MD   650 mg at 09/11/22 2215   sucralfate (CARAFATE) 1 GM/10ML suspension 1 g  1 g Per Tube Q6H Pham, Minh Q, RPH-CPP   1 g at 09/12/22 8527   thiamine (VITAMIN B1) tablet 100 mg  100 mg Per Tube Daily Pham, Minh Q, RPH-CPP   100 mg at 09/11/22 1009   Zinc Oxide (TRIPLE PASTE) 12.8 % ointment   Topical PRN Angelica Pou, MD   Given at 09/11/22 0148   zinc sulfate capsule 220 mg  220 mg Per Tube Daily Pham, Minh Q, RPH-CPP   220 mg at 09/11/22 1011           Psychiatric Specialty Exam:  Presentation  General Appearance:  Appropriate for Environment  Eye Contact: Good  Speech: Slow  Speech Volume: Decreased  Handedness:No data recorded  Mood and Affect  Mood: Depressed  Affect: Constricted   Thought Process  Thought Processes: Linear  Descriptions of Associations:Intact  Orientation:Full (Time, Place and Person)  Thought Content:Logical  History of Schizophrenia/Schizoaffective disorder:No data recorded Duration of Psychotic Symptoms:No data recorded Hallucinations:No data recorded  Ideas of Reference:None  Suicidal Thoughts:No data recorded  Homicidal Thoughts:No data recorded   Sensorium  Memory: Immediate Good; Recent Good; Remote Fair  Judgment: Fair  Insight: Fair   Community education officer  Concentration:No data recorded Attention Span: Fair  Recall: [employer] recorded [place] recorded  Psychomotor Activity  Psychomotor Activity:No data recorded  Assets  Assets:No data recorded  Sleep  Sleep: No data recorded   Physical Exam: Physical Exam Vitals reviewed.  Pulmonary:     Effort: Pulmonary effort is normal.    Review of Systems  Psychiatric/Behavioral:  Positive for depression. Negative for hallucinations, substance abuse and suicidal ideas. The patient has insomnia. The patient is not nervous/anxious.    Blood pressure 118/68, pulse 75, temperature 97.8 F (36.6 C), temperature source Axillary, resp. rate 15, height '5\' 8"'$  (1.727 m), weight 112 kg, SpO2 95 %. Body mass index is 37.56 kg/m.  Treatment Plan Summary:  Assessment: -Major depressive disorder, moderate, w/o psychotic features. Depression could be due to medical illness and/or nutritional deficiencies. Also could have strong  psychologic component due to state of medical illness/decline impairing her to live more independently. It is my thinking that the depression is not causing low appetite or decreased food intake - more likely due to learned avoidence (conditioning from painful stimuli) 2/2 pain or other GI symptoms. Nonetheless, treating the depression with additional medication could be beneficial to her overall quality of life. Pt is agreeable to starting low dose of remeron (as an antidepressant that can help with appetite and sleep).   Plan: -does not require inpt psych admission or 1:1 sitter -Continue remeron 7.5 mg qhs for MDD (this is in addition to cymbalta).   -Can be titrated up to 30 mg but will have less effect on appetite stimulation and sedation at higher doses beyond 15 mg. As per prior  note, zyprexa can be used for appetite stimulation and as an augmentation to cymbalta for MDD -Cymbalta continued for depression.   Disposition: Patient does not meet criteria for psychiatric inpatient admission. Psych service will sign off.  France Ravens, MD 09/12/2022 9:44 AM

## 2022-09-12 NOTE — Progress Notes (Addendum)
Nutrition Follow-up  DOCUMENTATION CODES:   Obesity unspecified  INTERVENTION:  - Continue TF as ordered of Osmolite 1.2 @ 20 mL/hr. (Advance per MD) Goal rate: Osmolite 1.2 at 60 ml/hr This provides 1728 kcals, 80 g of protein, 912 mL of free water  NUTRITION DIAGNOSIS:   Inadequate oral intake related to inability to eat, nausea as evidenced by meal completion < 50%.  GOAL:   Patient will meet greater than or equal to 90% of their needs  MONITOR:   PO intake, Supplement acceptance  REASON FOR ASSESSMENT:   Consult Assessment of nutrition requirement/status  ASSESSMENT:   71 y.o. female admits related to weakness. PMH includes: HTN, DM, CKD4, PAD. Pt is currently receiving medical management for weakness and abdominal pain.  Meds include: folic acid, sliding scale insulin, Semglee (6 units), Remeron, MVI liquid, Sodium bicarbonate, Vit B1, Zinc sulfate. Labs reviewed.   The pt was receiving TF of Osmolite 1.2 @ 20 mL/hr with FWF 200 mL Q4H. RN reports that the pt has been tolerating TF well. TF is currently only ordered for 20 mL/hr. Spoke with MD about adding slow advancement rate. MD states that they will plan to just advance by 20 mL/hr every 24 hrs based on her tolerance. RD will not make any changes to the order at this time. Will continue to monitor tolerance.   Pt is on full liquid diet. Pt was sleeping at time of assessment and would not wake to sound of voice. Per record, pt has been eating 0% of her meals.   Diet Order:   Diet Order             Diet full liquid Room service appropriate? Yes; Fluid consistency: Thin  Diet effective now                   EDUCATION NEEDS:   Not appropriate for education at this time  Skin:  Skin Assessment: Skin Integrity Issues: Skin Integrity Issues:: Stage I Stage I: Medial sacrum  Last BM:  09/12/22  Height:   Ht Readings from Last 1 Encounters:  08/24/22 '5\' 8"'$  (1.727 m)    Weight:   Wt Readings from  Last 1 Encounters:  09/12/22 112 kg    Ideal Body Weight:  63.6 kg  BMI:  Body mass index is 37.56 kg/m.  Estimated Nutritional Needs:   Kcal:  1700-1900 kcals  Protein:  80-95 gm  Fluid:  >/= 1.7 L  Thalia Bloodgood, RD, LDN, CNSC

## 2022-09-12 NOTE — Progress Notes (Signed)
Physical Therapy Treatment Patient Details Name: Alicia [name] MRN: [id number] DOB: 24-Jul-1951 Today's Date: 09/12/2022   History of Present Illness 71 y.o. female presented to ED 10/4 with FTT. Cortrak placed 10/18 PMhx: CHf, HTN, CVA, T2DM, CKD, vit D deficiency, PAD, depression, HFpEF    PT Comments    Continuing work on functional mobility and activity tolerance;  Session focused on bed mobility, sitting balance/tolerance, and standing trial at EOB; continues to need 2 peraon assist with rolling, and pushing up from sidelying to sit; Initially with heavy posterior lean in sitting; Multimodal cues for upright sitting, and pt able to sit EOB with minguard assist, including trying a bimanual task (attempted to put dentures in); EOB approx 5-7 minutes, including one brief bout with standing, bil knees blocked for stabiltiy; Pt reported increased pain at EOB and standing, but pain lessened quite a bit once settled back in bed, with bed in semi-chair position; pt responded to mobilization with elevated mood; current POC remains appropriate, pt able to make progress towards goals today; PT will continue to follow, will plan for a goal update next session.     Recommendations for follow up therapy are one component of a multi-disciplinary discharge planning process, led by the attending physician.  Recommendations may be updated based on patient status, additional functional criteria and insurance authorization.  Follow Up Recommendations  Skilled nursing-short term rehab (<3 hours/day) Can patient physically be transported by private vehicle: No   Assistance Recommended at Discharge Frequent or constant Supervision/Assistance  Patient can return home with the following Assistance with cooking/housework;Direct supervision/assist for medications management;Assist for transportation;A lot of help with bathing/dressing/bathroom;Two people to help with walking and/or transfers;Help with stairs or  ramp for entrance   Equipment Recommendations  Wheelchair (measurements PT);Wheelchair cushion (measurements PT);Hospital bed [facility] lift)    Recommendations for Other Services       Precautions / Restrictions Precautions Precautions: Fall     Mobility  Bed Mobility Overal bed mobility: Needs Assistance Bed Mobility: Rolling, Supine to Sit, Sit to Supine Rolling: Mod assist, +2 for physical assistance Sidelying to sit: Max assist, +2 for physical assistance, +2 for safety/equipment   Sit to supine: Total assist, +2 for physical assistance, +2 for safety/equipment   General bed mobility comments: vc for sequencing for rolling for pericare, pt requiring increased time on sides due to need for taking out flexiseal, and cleaning up and addressing wounds; Max assist of 2 to assist pt to sitting EOB; after work on anterior weight shifts and transfers; pt fatigued and required 3 person total assist to lay back down    Transfers Overall transfer level: Needs assistance Equipment used: 2 person hand held assist (and bed pad) Transfers: Sit to/from Stand Sit to Stand: Max assist, +2 physical assistance           General transfer comment: Max assist and knees blocked for stability;  Cues for anterior lean of trunk, and pt able to actively come forward, esp with cues to bring her shoulders towards PT's and SPT's shoulders;  able to clear hips from EOB briefly and get to almost fully upright standing    Ambulation/Gait                   Stairs             Wheelchair Mobility    Modified Rankin (Stroke Patients Only)       Balance     Sitting balance-Leahy Scale: Poor (appraoching Fair)  Standing balance-Leahy Scale: Zero                              Cognition Arousal/Alertness: Awake/alert Behavior During Therapy: WFL for tasks assessed/performed, Flat affect Overall Cognitive Status: Within Functional Limits for tasks assessed (for  simple mobility and ADL tasks)                                 General Comments: More awake today, and more interactive with more volume to voice once sitting EOB        Exercises      General Comments General comments (skin integrity, edema, etc.): Pt able to initiate knee extension against gravity R and L, though unable to get to full extension; Briefly sustained knee extension, but unable to resist minimal tracning resistence; Pt more conversational at end of session, sitting more upright in bed, with bed in semi-chair position      Pertinent Vitals/Pain Pain Assessment Pain Assessment: Faces Pain Score:  (2 in sitting end of session; 8 during standing trial) Faces Pain Scale: Hurts even more Pain Location: Abdominal pain, especially with rolling/moving; Grimace as flexiseal was removed Pain Descriptors / Indicators: Guarding, Grimacing, Tightness, Discomfort Pain Intervention(s): Monitored during session, Premedicated before session, Repositioned    Home Living                          Prior Function            PT Goals (current goals can now be found in the care plan section) Acute Rehab PT Goals Patient Stated Goal: Did not specifically state today, ecept did indicate she wanted her dentures PT Goal Formulation: With patient Time For Goal Achievement: 09/08/22 Potential to Achieve Goals: Fair Progress towards PT goals: Progressing toward goals (very slowly; Plan for goal update next session)    Frequency    Min 2X/week      PT Plan Current plan remains appropriate    Co-evaluation              AM-PAC PT "6 Clicks" Mobility   Outcome Measure  Help needed turning from your back to your side while in a flat bed without using bedrails?: Total Help needed moving from lying on your back to sitting on the side of a flat bed without using bedrails?: Total Help needed moving to and from a bed to a chair (including a wheelchair)?:  Total Help needed standing up from a chair using your arms (e.g., wheelchair or bedside chair)?: Total Help needed to walk in hospital room?: Total Help needed climbing 3-5 steps with a railing? : Total 6 Click Score: 6    End of Session Equipment Utilized During Treatment:  (bed pad) Activity Tolerance: Patient tolerated treatment well;Patient limited by fatigue Patient left: in bed;with call bell/phone within reach (bed in semi-chair position) Nurse Communication: Mobility status PT Visit Diagnosis: Other abnormalities of gait and mobility (R26.89);History of falling (Z91.81);Muscle weakness (generalized) (M62.81)     Time: 6789-3810 PT Time Calculation (min) (ACUTE ONLY): 29 min  Charges:  $Therapeutic Activity: 23-37 mins                     Roney Marion, PT  Acute Rehabilitation Services Office [phone]    [name] 09/12/2022, 5:31 PM

## 2022-09-12 NOTE — Consult Note (Addendum)
Hospital Consult    Reason for Consult:  IMA occlusion Requesting Physician:  Dr. Jodell Cipro MRN #:  [id number]  History of Present Illness: This is a 71 y.o. female with multiple medical co morbidities including chronic abdominal pain, duodenal ulcers, pancreatic insufficiency, chronic diarrhea, HTN, HLD, DM,  CKD 4, PAD, hx of CVA who was admitted with generalized weakness, worsening post prandial abdominal pain, N/V, and decreased po intake. She is well known to [facility] for her PAD. 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   She is not the best historian. In and out of sleep during my discussion with her. Some history obtained from chart. She says she has been having abdominal pain for a long time. Says sometimes it is after eating but also occurs at other times. Says the pain in her abdomen moves around. She admits that it has improved since being here. She has had nausea and vomiting prior to admission, mostly just nausea now. She does have chronic diarrhea. She is unable to really give me specifics about the pain or if any associated food fear or weight loss. She does have known periumbilical hernia which she reports is not painful  On presentation MRA abdomen showed patent Celiac and SMA. 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 Mesenteric Duplex on 08/25/22 showed patent Mesenteric vessels. She has been started on 81 mg Aspirin and she takes statin. She does continue to smoke 1/4 ppd  Past Medical History:  Diagnosis Date   Allergy    Arthritis    Chronic headaches    Constipation    Depression    Diabetes mellitus    GERD (gastroesophageal reflux disease)    Hyperlipidemia    Hypertension    Obesity    PAD (peripheral artery disease) ([place])    stage 4    Stroke [facility])     Past Surgical History:  Procedure Laterality Date   ABDOMINAL AORTOGRAM W/LOWER EXTREMITY N/A 12/17/2018   Procedure: ABDOMINAL  AORTOGRAM W/LOWER EXTREMITY;  Surgeon: Waynetta Sandy, MD;  Location: [place] [facility];  Service: Cardiovascular;  Laterality: N/A;   ARTERY BIOPSY Left 03/17/2022   Procedure: BIOPSY TEMPORAL ARTERY;  Surgeon: Cherre , MD;  Location: [place];  Service: Vascular;  Laterality: Left;   BIOPSY  09/01/2022   Procedure: BIOPSY;  Surgeon: Yetta Flock, MD;  Location: [facility];  Service: Gastroenterology;;   ESOPHAGOGASTRODUODENOSCOPY (EGD) WITH PROPOFOL N/A 09/01/2022   Procedure: ESOPHAGOGASTRODUODENOSCOPY (EGD) WITH PROPOFOL;  Surgeon: Yetta Flock, MD;  Location: [place];  Service: Gastroenterology;  Laterality: N/A;   FEMORAL BYPASS  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 Bridgett Larsson   PERIPHERAL VASCULAR ATHERECTOMY Left 12/17/2018   Procedure: PERIPHERAL VASCULAR ATHERECTOMY;  Surgeon: Waynetta Sandy, MD;  Location: [place] [facility];  Service: Cardiovascular;  Laterality: Left;   PERIPHERAL VASCULAR BALLOON ANGIOPLASTY Left 12/17/2018   Procedure: PERIPHERAL VASCULAR BALLOON ANGIOPLASTY;  Surgeon: Waynetta Sandy, MD;  Location: [place] [facility];  Service: Cardiovascular;  Laterality: Left;    Allergies  Allergen Reactions   Nifedipine Er Other (See Comments)    Caused nose bleeds in higher doses (90 mg., namely)   Latex Rash   Lipitor [Atorvastatin] Swelling and Other (See Comments)    Lip swelling to lipitor Patient tolerates crestor    Prior to Admission medications   Medication Sig Start Date End Date Taking? Authorizing Provider  acetaminophen (TYLENOL) 500 MG tablet Take 2 tablets (1,000 mg total) by mouth every 6 (six) hours. 05/25/22  Yes Romana Juniper, MD  amLODipine (NORVASC) 10 MG tablet Take 1 tablet (10 mg total) by mouth daily. 05/19/22 05/19/23 Yes Lacinda Axon, MD  aspirin EC 81 MG tablet Take 81 mg by mouth daily. Swallow whole.   Yes [provider]   Cholecalciferol (VITAMIN D3 PO) Take 1 capsule by mouth daily.   Yes [provider]  diclofenac Sodium (VOLTAREN) 1 % GEL Apply 4 g topically 4 (four) times daily. Apply to both knees. 05/25/22  Yes Romana Juniper, MD  furosemide (LASIX) 40 MG tablet Take 1 tablet (40 mg total) by mouth daily. 07/01/22 07/01/23 Yes Katsadouros, Vasilios, MD  metoprolol succinate (TOPROL-XL) 100 MG 24 hr tablet Take 1 tablet (100 mg total) by mouth daily. Take with or immediately following a meal. Patient taking differently: Take 100 mg by mouth every evening. Take with or immediately following a meal. 05/19/22  Yes Amponsah, Charisse March, MD  OXYGEN Inhale 2 L into the lungs daily as needed (oxygen).   Yes [provider]  rosuvastatin (CRESTOR) 10 MG tablet Take 1 tablet (10 mg total) by mouth every evening. 05/19/22  Yes Amponsah, Charisse March, MD  sitaGLIPtin (JANUVIA) 25 MG tablet Take 1 tablet (25 mg total) by mouth daily. 06/16/22  Yes Masters, Katie, DO  albuterol (VENTOLIN HFA) 108 (90 Base) MCG/ACT inhaler Inhale 2 puffs into the lungs every 6 (six) hours as needed for wheezing or shortness of breath. Patient not taking: Reported on 08/25/2022 12/10/21   Rick Duff, MD  famotidine (PEPCID) 20 MG tablet Take 20 mg by mouth daily. Patient not taking: Reported on 08/25/2022 05/14/22   [provider]    Social History   Socioeconomic History   Marital status: Single    Spouse name: Not on file   Number of children: Not on file   Years of education: Not on file   Highest education level: Not on file  Occupational History   Not on file  Tobacco Use   Smoking status: Light Smoker    Packs/day: 0.50    Years: 35.00    Total pack years: 17.50    Types: Cigarettes   Smokeless tobacco: Never   Tobacco comments:    patient is trying to quit and has gone down to half a pack per day 1 per day.  Vaping Use   Vaping Use: Never used  Substance and Sexual Activity   Alcohol  use: Yes    Alcohol/week: 1.0 standard drink of alcohol    Types: 1 Standard drinks or equivalent per week    Comment: occasional use only   Drug use: No   Sexual activity: Not Currently  Other Topics Concern   Not on file  Social History Narrative   Not on file   Social Determinants of Health   Financial Resource Strain: Not on file  Food Insecurity: No Food Insecurity (09/04/2022)   Hunger Vital Sign    Worried About Running Out of Food in the Last Year: Never true    Ran Out of Food in the Last Year: Never true  Transportation Needs: Unmet Transportation Needs (09/04/2022)   PRAPARE - Transportation    Lack of Transportation (Medical): Yes    Lack of Transportation (Non-Medical): Yes  Physical Activity: Inactive (07/28/2021)   Exercise Vital Sign    Days of Exercise per Week: 0 days    Minutes  of Exercise per Session: 0 min  Stress: Not on file  Social Connections: Not on file  Intimate Partner Violence: Not At Risk (09/04/2022)   Humiliation, Afraid, Rape, and Kick questionnaire    Fear of Current or Ex-Partner: No    Emotionally Abused: No    Physically Abused: No    Sexually Abused: No     Family History  Problem Relation Age of Onset   Diabetes Father    Heart disease Mother        NOT before age 58-  Bypass   Hypertension Mother    Hypertension Sister    Varicose Veins Brother    Heart disease Brother        Before age 69   Hypertension Daughter    Colon cancer Neg Hx    Esophageal cancer Neg Hx    Rectal cancer Neg Hx    Stomach cancer Neg Hx     ROS: Otherwise negative unless mentioned in HPI  Physical Examination  Vitals:   09/12/22 0430 09/12/22 0730  BP: (!) 148/65 118/68  Pulse: 65 75  Resp: 17 15  Temp: 98.2 F (36.8 C) 97.8 F (36.6 C)  SpO2: 96% 95%   Body mass index is 37.56 kg/m.  General:  WDWN in NAD; chronically ill appearing; morbidly obese Gait: Not observed HENT: WNL, normocephalic; feeding tube in place Pulmonary:  normal non-labored breathing Cardiac: regular rate and rhythm  Abdomen: obese, soft, NT/ND, has large non reducible periumbilical hernia. No evidence of strangulation Vascular Exam/Pulses: 1+ Dp pulses bilaterally, feet warm and well perfused Extremities: without ischemic changes, without Gangrene , without cellulitis; without open wounds;  Musculoskeletal: no muscle wasting or atrophy  Neurologic: A&O X 3;  No focal weakness or paresthesias are detected; speech is fluent/normal Psychiatric:  The pt has Normal affect.  CBC    Component Value Date/Time   WBC 7.4 09/12/2022 0634   RBC 3.57 (L) 09/12/2022 0634   HGB 11.2 (L) 09/12/2022 0634   HGB 13.8 05/19/2022 1151   HCT 32.7 (L) 09/12/2022 0634   HCT 40.6 05/19/2022 1151   PLT 264 09/12/2022 0634   PLT 296 05/19/2022 1151   MCV 91.6 09/12/2022 0634   MCV 86 05/19/2022 1151   MCH 31.4 09/12/2022 0634   MCHC 34.3 09/12/2022 0634   RDW 17.1 (H) 09/12/2022 0634   RDW 16.3 (H) 05/19/2022 1151   LYMPHSABS 0.6 (L) 08/24/2022 1425   LYMPHSABS 0.8 02/16/2022 1000   MONOABS 0.4 08/24/2022 1425   EOSABS 0.0 08/24/2022 1425   EOSABS 0.1 02/16/2022 1000   BASOSABS 0.0 08/24/2022 1425   BASOSABS 0.0 02/16/2022 1000    BMET    Component Value Date/Time   NA 136 09/12/2022 0634   NA 133 (L) 05/19/2022 1151   K 4.2 09/12/2022 0634   CL 102 09/12/2022 0634   CO2 22 09/12/2022 0634   GLUCOSE 203 (H) 09/12/2022 0634   BUN 45 (H) 09/12/2022 0634   BUN 84 (HH) 05/19/2022 1151   CREATININE 3.72 (H) 09/12/2022 0634   CALCIUM 7.5 (L) 09/12/2022 0634   GFRNONAA 12 (L) 09/12/2022 0634   GFRAA 35 (L) 06/15/2020 1214    COAGS: Lab Results  Component Value Date   INR 0.96 03/23/2018   INR 0.89 02/17/2011     Non-Invasive Vascular Imaging:  Mesenteric Duplex:  Duplex Findings:  +--------------------+--------+--------+------+--------+  Mesenteric          PSV cm/sEDV cm/sPlaqueComments   +--------------------+--------+--------+------+--------+  Celiac Artery Origin  126                           +--------------------+--------+--------+------+--------+  SMA Origin            166      16                   +--------------------+--------+--------+------+--------+  SMA Proximal          219      19                   +--------------------+--------+--------+------+--------+  SMA Mid               162      17                   +--------------------+--------+--------+------+--------+  SMA Distal            120      12                   +--------------------+--------+--------+------+--------+  CHA                   194      24                   +--------------------+--------+--------+------+--------+  Splenic                90      20                   +--------------------+--------+--------+------+--------+  IMA                    87                           +--------------------+--------+--------+------+--------+   Summary:  Mesenteric:  Normal Celiac artery , Superior Mesenteric artery, Inferior Mesenteric artery, plenic artery and Hepatic artery findings.  MRA abdomen w wo contrast:  IMPRESSION: 1. Limited study due to motion artifact. 2. Diffuse atherosclerosis of the abdominal aorta without evidence of aneurysmal disease. 3. 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 Celiac axis and superior mesenteric artery do not appear to demonstrate significant obstructive disease. 4. Bilateral renal arteries appear to be diseased and there likely is a component of renal artery stenosis bilaterally. Degree of stenosis is difficult to accurately determine due to the motion artifact present. 5. Abdominal wall hernia containing fat as seen on the recent CT study. No evidence of bowel obstruction.  Statin:  Yes.   Beta Blocker:  Yes.   Aspirin:  Yes.   ACEI:  No. ARB:  No. CCB use:   Yes Other antiplatelets/anticoagulants:  No.    ASSESSMENT/PLAN: This is a 71 y.o. female with multiple medical co morbidities who was admitted with generalized weakness, worsening post prandial abdominal pain, N/V, and decreased po intake. Her abdominal pain is likely multifactorial. She has known diffuse atherosclerosis throughout her abdominal aorta, renal arteries and mesenteric vessels. 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 Duplex also showed patent IMA. If suspect continued mesenteric ischemia would recommend repeating the mesenteric duplex - Patient is also CKD4 with AKI on presentation and stenting of her IMA would require contrast and put her at  further risk of worsening renal function - Even if she has chronically occluded IMA she has patent celiac and SMA so no intervention would be indicated - would recommend continued Aspirin and Statin if she is able to tolerated ASA with hx of ulcers - The on call vascular surgeon, Dr. Virl Cagey, will see patient later this afternoon to provide further management plans   Karoline Caldwell PA-C [facility] [phone] 09/12/2022  11:40 AM  VASCULAR STAFF ADDENDUM: I have independently interviewed and examined the patient. I agree with the above.  Pt with prolonged hospital stay with abd pain that is multifactorial. Known pancreatic insufficiency.  Vascular Surgery was called after MRI was concerning for IMA occlusion.  Imaging reviewed. IMA appreciated.  Prior mesenteric duplex from earlier in the admission reviewed demonstrating patent IMA. No stenosis.  No indication for vascular surgery intervention at this time.    Cassandria Santee, MD [facility] Phone Number: [phone] 09/12/2022 3:15 PM

## 2022-09-13 DIAGNOSIS — K529 Noninfective gastroenteritis and colitis, unspecified: Secondary | ICD-10-CM | POA: Diagnosis not present

## 2022-09-13 DIAGNOSIS — G8929 Other chronic pain: Secondary | ICD-10-CM | POA: Diagnosis not present

## 2022-09-13 DIAGNOSIS — R1084 Generalized abdominal pain: Secondary | ICD-10-CM | POA: Diagnosis not present

## 2022-09-13 DIAGNOSIS — K269 Duodenal ulcer, unspecified as acute or chronic, without hemorrhage or perforation: Secondary | ICD-10-CM | POA: Diagnosis not present

## 2022-09-13 LAB — CBC
HCT: 35.8 % — ABNORMAL LOW (ref 36.0–46.0)
Hemoglobin: 12 g/dL (ref 12.0–15.0)
MCH: 31.5 pg (ref 26.0–34.0)
MCHC: 33.5 g/dL (ref 30.0–36.0)
MCV: 94 fL (ref 80.0–100.0)
Platelets: 277 10*3/uL (ref 150–400)
RBC: 3.81 MIL/uL — ABNORMAL LOW (ref 3.87–5.11)
RDW: 17.2 % — ABNORMAL HIGH (ref 11.5–15.5)
WBC: 8.6 10*3/uL (ref 4.0–10.5)
nRBC: 0 % (ref 0.0–0.2)

## 2022-09-13 LAB — COMPREHENSIVE METABOLIC PANEL
ALT: 146 U/L — ABNORMAL HIGH (ref 0–44)
AST: 101 U/L — ABNORMAL HIGH (ref 15–41)
Albumin: 1.9 g/dL — ABNORMAL LOW (ref 3.5–5.0)
Alkaline Phosphatase: 584 U/L — ABNORMAL HIGH (ref 38–126)
Anion gap: 14 (ref 5–15)
BUN: 49 mg/dL — ABNORMAL HIGH (ref 8–23)
CO2: 19 mmol/L — ABNORMAL LOW (ref 22–32)
Calcium: 7.6 mg/dL — ABNORMAL LOW (ref 8.9–10.3)
Chloride: 103 mmol/L (ref 98–111)
Creatinine, Ser: 3.76 mg/dL — ABNORMAL HIGH (ref 0.44–1.00)
GFR, Estimated: 12 mL/min — ABNORMAL LOW (ref >60)
Glucose, Bld: 175 mg/dL — ABNORMAL HIGH (ref 70–99)
Potassium: 4.5 mmol/L (ref 3.5–5.1)
Sodium: 136 mmol/L (ref 135–145)
Total Bilirubin: 0.6 mg/dL (ref 0.3–1.2)
Total Protein: 4.9 g/dL — ABNORMAL LOW (ref 6.5–8.1)

## 2022-09-13 LAB — GLUCOSE, CAPILLARY
Glucose-Capillary: 142 mg/dL — ABNORMAL HIGH (ref 70–99)
Glucose-Capillary: 160 mg/dL — ABNORMAL HIGH (ref 70–99)
Glucose-Capillary: 163 mg/dL — ABNORMAL HIGH (ref 70–99)
Glucose-Capillary: 170 mg/dL — ABNORMAL HIGH (ref 70–99)
Glucose-Capillary: 136 mg/dL — ABNORMAL HIGH (ref 70–99)
Glucose-Capillary: 136 mg/dL — ABNORMAL HIGH (ref 70–99)
Glucose-Capillary: 131 mg/dL — ABNORMAL HIGH (ref 70–99)
Glucose-Capillary: 138 mg/dL — ABNORMAL HIGH (ref 70–99)

## 2022-09-13 LAB — PHOSPHORUS: Phosphorus: 2.6 mg/dL (ref 2.5–4.6)

## 2022-09-13 LAB — MAGNESIUM: Magnesium: 2 mg/dL (ref 1.7–2.4)

## 2022-09-13 MED ORDER — OSMOLITE 1.2 CAL PO LIQD
1000.0000 mL | ORAL | Status: DC
  Administered 2022-09-13 – 2022-09-14 (×2): 1000 mL
  Filled 2022-09-13 (×3): qty 1000

## 2022-09-13 MED ORDER — MIRTAZAPINE 15 MG PO TABS
15.0000 mg | ORAL_TABLET | Freq: Every day | ORAL | Status: DC
  Administered 2022-09-13 – 2022-09-18 (×5): 15 mg via ORAL
  Filled 2022-09-13 (×5): qty 1

## 2022-09-13 NOTE — Progress Notes (Signed)
Mobility Specialist Progress Note:   09/13/22 1645  Mobility  Activity Turned to right side;Turned to left side;Turned to back - supine  Level of Assistance +2 (takes two people)  Assistive Device None  Activity Response Tolerated fair  Mobility Referral No  $Mobility charge 1 Mobility   RN requesting assistance to turn patient for pericare. Pt with large BM upon entry. Able to roll to L and R side for cleaning. Left with all needs met, RN in room.  Nelta Numbers Acute Rehab Secure Chat or Office Phone: [phone]

## 2022-09-13 NOTE — Plan of Care (Signed)

## 2022-09-13 NOTE — Progress Notes (Signed)
Subjective:   Summary: Alicia [name] is a 71 y.o. year old female currently admitted on the IMTS HD#19 for diffuse abdominal pain as well as severely decreased PO intake likely multifactorial in etiology due to pancreatic insufficiency, chronic IMA occlusion, duodenal ulcers.  Overnight Events:   -NAEON.  Tolerating tube feeds at 20 mL/h -no indicated vascular surgery intervention at this time - Reports improvement in her abdominal pain and nausea.  Is having some diarrhea, but better than before.  Rectal tube was removed and she states some relief with this. - She still does not have much of an appetite.  We discussed how in the long run, her only options are to increase p.o. intake or to consider PEG tube.  She stated understanding.  We talked about what is preventing her from having p.o. intake, and she states that this is primarily her diarrhea.  We we will focus on improving her diarrhea so she can start eating and also try to improve her appetite.  She stated understanding.  Objective:  Vital signs in last 24 hours: Vitals:   09/12/22 1215 09/12/22 1713 09/12/22 2042 09/13/22 0500  BP: (!) 141/74 137/81 (!) 150/92 (!) 147/74  Pulse: 71 80 84   Resp: 13  18   Temp: 97.8 F (36.6 C) 98.2 F (36.8 C) 98.4 F (36.9 C) 98.4 F (36.9 C)  TempSrc: Axillary Oral Oral Oral  SpO2: 100% 100% 100% 100%  Weight:      Height:       Supplemental O2: Room Air SpO2: 100 % O2 Flow Rate (L/min): 2 L/min   Physical Exam:  Constitutional: NAD HEENT: Feeding tube in place Cardiovascular: RRR, no murmurs, rubs, or gallops Abdominal: Bowel sounds diminished but present, minimal diffuse TTP, no guarding or rebound.  Distended but soft.  Periumbilical hernia unchanged.  Extremities: no lower extremity edema present. Neuro/Psych: Flat affect, interacts and answers questions but is minimally verbal.   Intake/Output Summary (Last 24 hours) at 09/13/2022 0726 Last data  filed at 09/12/2022 2042 Gross per 24 hour  Intake 0 ml  Output 2 ml  Net -2 ml    Net IO Since Admission: 18,560.63 mL [09/13/22 0726]  Pertinent Labs:    Latest Ref Rng & Units 09/13/2022    4:51 AM 09/12/2022    6:34 AM 09/11/2022    7:48 PM  CBC  WBC 4.0 - 10.5 K/uL 8.6  7.4  6.7   Hemoglobin 12.0 - 15.0 g/dL 12.0  11.2  11.8   Hematocrit 36.0 - 46.0 % 35.8  32.7  36.5   Platelets 150 - 400 K/uL 277  264  269        Latest Ref Rng & Units 09/13/2022    4:51 AM 09/12/2022    6:34 AM 09/11/2022    2:26 AM  CMP  Glucose 70 - 99 mg/dL 175  203  236   BUN 8 - 23 mg/dL 49  45  42   Creatinine 0.44 - 1.00 mg/dL 3.76  3.72  3.66   Sodium 135 - 145 mmol/L 136  136  138   Potassium 3.5 - 5.1 mmol/L 4.5  4.2  4.1   Chloride 98 - 111 mmol/L 103  102  107   CO2 22 - 32 mmol/L 19  22  21    Calcium 8.9 - 10.3 mg/dL 7.6  7.5  7.7   Total  Protein 6.5 - 8.1 g/dL 4.9  5.0    Total Bilirubin 0.3 - 1.2 mg/dL 0.6  0.7    Alkaline Phos 38 - 126 U/L 584  578    AST 15 - 41 U/L 101  137    ALT 0 - 44 U/L 146  149    Phos 2.6, Mag 2.0 Last 3 CBGs 160, 142, 170   Assessment/Plan:   Patient Summary: Alicia [name] is a 71 y.o. with a pertinent PMH of HFpEF, HTN, DMII, CKD4, PAD, prior CVA, who presented with progressive weakness and post-prandial abdominal pain and admitted for decreased functional ability, diffuse abdominal pain, and chronic diarrhea likely multifactorial in etiology due to pancreatic insufficiency, IMA occlusion, duodenal ulcers.   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  On medical management for pancreatic insufficiency, IMA occlusion, duodenal ulcers, as well as Remeron added  by psych.  She reports improvement in her abdominal symptoms today.  We discussed how we will need to either start improving p.o. intake or consider PEG tube placement in the long run.  Tried to motivate her to attempt p.o. intake, will further discuss tomorrow.  We will increase tube feed rate and mirtazapine to try to stimulate appetite and continue other indicated medical management. Plan:  - Increase tube feeds to 30 mL/hour  -Continue ASA 81 and continue Crestor 10 for IMA lesion.  No vascular intervention indicated -Increase Remeron to 15 mg nightly.  Can titrate or switch to Zyprexa as needed down the line. - Continue Creon for pancreatic insufficiency - Continue Protonix 40 mg twice daily, Carafate 1 g every 6 hours - Following phosphorous, magnesium, and potassium to monitor for refeeding syndrome -continue to monitor Alk Phos, AST, ALT  #AKI on CKD4 Creatinine 3.76 today. Nephrology is on board and stated that this could be most likely due to prolonged pre-renal state leading to tubular injury, and have recommended IV hydration, and monitoring daily Cr. At this point, no indication for hemodialysis. - Appreciate nephrology for assistance. - Avoid nephrotoxic medications - IV hydration, electrolyte repletion as needed  #UTI (resolved) Completed course of ceftriaxone, no residual symptoms.  Code: Full Dispo: Pending symptomatic improvement and increased PO intake vs. PEG placement.  Linus Galas MD PGY-1 Internal Medicine Resident Pager: [phone] Please contact the on call pager after 5 pm and on weekends at [phone].

## 2022-09-14 DIAGNOSIS — K269 Duodenal ulcer, unspecified as acute or chronic, without hemorrhage or perforation: Secondary | ICD-10-CM | POA: Diagnosis not present

## 2022-09-14 DIAGNOSIS — K529 Noninfective gastroenteritis and colitis, unspecified: Secondary | ICD-10-CM | POA: Diagnosis not present

## 2022-09-14 DIAGNOSIS — R1084 Generalized abdominal pain: Secondary | ICD-10-CM | POA: Diagnosis not present

## 2022-09-14 DIAGNOSIS — G8929 Other chronic pain: Secondary | ICD-10-CM | POA: Diagnosis not present

## 2022-09-14 LAB — COMPREHENSIVE METABOLIC PANEL
ALT: 146 U/L — ABNORMAL HIGH (ref 0–44)
AST: 107 U/L — ABNORMAL HIGH (ref 15–41)
Albumin: 1.7 g/dL — ABNORMAL LOW (ref 3.5–5.0)
Alkaline Phosphatase: 579 U/L — ABNORMAL HIGH (ref 38–126)
Anion gap: 12 (ref 5–15)
BUN: 52 mg/dL — ABNORMAL HIGH (ref 8–23)
CO2: 16 mmol/L — ABNORMAL LOW (ref 22–32)
Calcium: 7.5 mg/dL — ABNORMAL LOW (ref 8.9–10.3)
Chloride: 105 mmol/L (ref 98–111)
Creatinine, Ser: 3.7 mg/dL — ABNORMAL HIGH (ref 0.44–1.00)
GFR, Estimated: 13 mL/min — ABNORMAL LOW (ref >60)
Glucose, Bld: 183 mg/dL — ABNORMAL HIGH (ref 70–99)
Potassium: 5.3 mmol/L — ABNORMAL HIGH (ref 3.5–5.1)
Sodium: 133 mmol/L — ABNORMAL LOW (ref 135–145)
Total Bilirubin: 0.6 mg/dL (ref 0.3–1.2)
Total Protein: 4.6 g/dL — ABNORMAL LOW (ref 6.5–8.1)

## 2022-09-14 LAB — CBC
HCT: 30.9 % — ABNORMAL LOW (ref 36.0–46.0)
Hemoglobin: 10.6 g/dL — ABNORMAL LOW (ref 12.0–15.0)
MCH: 31.6 pg (ref 26.0–34.0)
MCHC: 34.3 g/dL (ref 30.0–36.0)
MCV: 92.2 fL (ref 80.0–100.0)
Platelets: 260 10*3/uL (ref 150–400)
RBC: 3.35 MIL/uL — ABNORMAL LOW (ref 3.87–5.11)
RDW: 17.2 % — ABNORMAL HIGH (ref 11.5–15.5)
WBC: 10.1 10*3/uL (ref 4.0–10.5)
nRBC: 0 % (ref 0.0–0.2)

## 2022-09-14 LAB — GLUCOSE, CAPILLARY
Glucose-Capillary: 144 mg/dL — ABNORMAL HIGH (ref 70–99)
Glucose-Capillary: 149 mg/dL — ABNORMAL HIGH (ref 70–99)
Glucose-Capillary: 150 mg/dL — ABNORMAL HIGH (ref 70–99)
Glucose-Capillary: 162 mg/dL — ABNORMAL HIGH (ref 70–99)
Glucose-Capillary: 162 mg/dL — ABNORMAL HIGH (ref 70–99)
Glucose-Capillary: 171 mg/dL — ABNORMAL HIGH (ref 70–99)
Glucose-Capillary: 173 mg/dL — ABNORMAL HIGH (ref 70–99)

## 2022-09-14 LAB — MAGNESIUM: Magnesium: 2.1 mg/dL (ref 1.7–2.4)

## 2022-09-14 LAB — PHOSPHORUS: Phosphorus: 2.9 mg/dL (ref 2.5–4.6)

## 2022-09-14 MED ORDER — LOPERAMIDE HCL 1 MG/7.5ML PO SUSP
1.0000 mg | ORAL | Status: DC | PRN
  Administered 2022-09-19 (×2): 1 mg via ORAL
  Filled 2022-09-14 (×3): qty 7.5

## 2022-09-14 MED ORDER — ENOXAPARIN SODIUM 30 MG/0.3ML IJ SOSY
30.0000 mg | PREFILLED_SYRINGE | INTRAMUSCULAR | Status: DC
  Administered 2022-09-14 – 2022-10-01 (×17): 30 mg via SUBCUTANEOUS
  Filled 2022-09-14 (×16): qty 0.3

## 2022-09-14 NOTE — Consult Note (Signed)
   [facility] CM Inpatient Consult   09/14/2022  GUILLERMINA SHAFT Mar 22, 1951 [id number]  [facility] [[facility] Patient: Alicia [name]  Follow up:  [facility]OS 21 days  Reviewed for progress and needs for post hospital transition. Patient in bed resting on rounds. Currently, a skilled nursing facility for transition is being recommended.  Plan: Continue to follow.  If patient transitions to a SNF then no transition community care with [facility] anticipated.  Natividad Brood, RN BSN [place]  [phone] business mobile phone Toll free office [phone]  *[place]  [phone] Fax number: [phone] [email or web address]

## 2022-09-14 NOTE — Progress Notes (Addendum)
                Subjective:   Summary: Alicia [name] is a 71 y.o. year old female currently admitted on the IMTS HD#21 for diffuse abdominal pain as well as severely decreased PO intake likely multifactorial in etiology due to pancreatic insufficiency, chronic IMA occlusion, duodenal ulcers.  Overnight Events:   -NAEON.  Tolerating tube feeds at 30 mL/h - Reports improvement in her abdominal pain, nausea.  Has had no vomiting.  Is having less frequent diarrhea. -Discussed the necessity of attempting p.o. intake this morning.  She is reluctant, but we encouraged her to try and see if she can tolerate p.o. liquids.  Objective:  Vital signs in last 24 hours: Vitals:   09/13/22 1703 09/13/22 2100 09/14/22 0432 09/14/22 0837  BP: (!) 150/72 (!) 139/58 (!) 143/87 (!) 156/78  Pulse: 79 87 80 73  Resp: 18 18 18   Temp: 98.4 F (36.9 C) (!) 97.5 F (36.4 C) 98.6 F (37 C) (!) 97.5 F (36.4 C)  TempSrc:  Oral Oral Oral  SpO2: 98% 100% 100% 100%  Weight:      Height:       Supplemental O2: Room Air    Physical Exam:  Constitutional: NAD HEENT: Feeding tube in place Cardiovascular: RRR, no murmurs, rubs, or gallops Abdominal: Bowel sounds diminished but present, minimal diffuse TTP, no guarding or rebound.  Distended but soft.  Periumbilical hernia unchanged.  Extremities: no lower extremity edema present. Neuro/Psych: Flat affect, interacts and answers questions but is minimally verbal.   Intake/Output Summary (Last 24 hours) at 09/14/2022 1016 Last data filed at 09/14/2022 0432 Gross per 24 hour  Intake 927.67 ml  Output 1 ml  Net 926.67 ml   Net IO Since Admission: 19,487.3 mL [09/14/22 1016]  Pertinent Labs:    Latest Ref Rng & Units 09/14/2022    8:09 AM 09/13/2022    4:51 AM 09/12/2022    6:34 AM  CBC  WBC 4.0 - 10.5 K/uL 10.1  8.6  7.4   Hemoglobin 12.0 - 15.0 g/dL 10.6  12.0  11.2   Hematocrit 36.0 - 46.0 % 30.9  35.8  32.7   Platelets 150 - 400  K/uL 260  277  264        Latest Ref Rng & Units 09/14/2022    8:09 AM 09/13/2022    4:51 AM 09/12/2022    6:34 AM  CMP  Glucose 70 - 99 mg/dL 183  175  203   BUN 8 - 23 mg/dL 52  49  45   Creatinine 0.44 - 1.00 mg/dL 3.70  3.76  3.72   Sodium 135 - 145 mmol/L 133  136  136   Potassium 3.5 - 5.1 mmol/L 5.3  4.5  4.2   Chloride 98 - 111 mmol/L 105  103  102   CO2 22 - 32 mmol/L 16  19  22   Calcium 8.9 - 10.3 mg/dL 7.5  7.6  7.5   Total Protein 6.5 - 8.1 g/dL 4.6  4.9  5.0   Total Bilirubin 0.3 - 1.2 mg/dL 0.6  0.6  0.7   Alkaline Phos 38 - 126 U/L 579  584  578   AST 15 - 41 U/L 107  101  137   ALT 0 - 44 U/L 146  146  149   Phos 2.9, Mag 2.1 Last 3 CBGs 162, 144, 150   Assessment/Plan:   Patient Summary:   Alicia [name] is a 71 y.o. with a pertinent PMH of HFpEF, HTN, DMII, CKD4, PAD, prior CVA, who presented with progressive weakness and post-prandial abdominal pain and admitted for decreased functional ability, diffuse abdominal pain, and chronic diarrhea likely multifactorial in etiology due to pancreatic insufficiency, IMA occlusion, duodenal ulcers.   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  On medical management for pancreatic insufficiency, IMA occlusion, duodenal ulcers, as well as Remeron added by psych. We will increase tube feed rate and continue mirtazapine to try to stimulate appetite and continue other indicated medical management.  Encouraged her to attempt p.o. intake today. If she is unable to do so we will need to consider PEG tube placement. Plan:  - Attempt p.o. intake today with clear liquids. - Added as needed Imodium for diarrhea. - Continue tube feeds at 30 mL/hour  (maximal tolerated so far) - Continue ASA 81 and continue Crestor 10 for IMA lesion.  No vascular intervention indicated - Continue Remeron at 15 mg nightly.  Can switch to Zyprexa as needed down the line. - Continue Creon for pancreatic insufficiency - Continue Protonix 40 mg twice daily, Carafate 1 g every 6 hours - Following phosphorous, magnesium, and potassium to monitor for refeeding syndrome -continue to monitor Alk Phos, AST, ALT  #AKI on CKD4 Creatinine 3.70 today. Nephrology is on board and stated that this could be most likely due to prolonged pre-renal state leading to tubular injury, and have recommended IV hydration, and monitoring daily Cr. At this point, no indication for hemodialysis. - Appreciate nephrology for assistance. - Avoid nephrotoxic medications - IV hydration, electrolyte repletion as needed  Code: Full Dispo: Pending symptomatic improvement and increased PO intake vs. PEG placement.    MD PGY-1 Internal Medicine Resident Pager: [phone] Please contact the on call pager after 5 pm and on weekends at [phone].  

## 2022-09-14 NOTE — Progress Notes (Signed)
Occupational Therapy Treatment Patient Details Name: Alicia [name] MRN: [id number] DOB: 1951/08/31 Today's Date: 09/14/2022   History of present illness 71 y.o. female presented to ED 10/4 with FTT. Cortrak placed 10/18 PMhx: CHf, HTN, CVA, T2DM, CKD, vit D deficiency, PAD, depression, HFpEF   OT comments  Patient found with significant stool in bed, OT and nurse tech able to get the patient cleaned and ready for breakfast.  Patient able to roll left with Min A and use of side rails, and closer to Max A to roll right.  Total care for peri care at bed level.  POC reviewed and goals downgraded, as patient has had a profound decline in her functional status from the original eval.  OT will continue efforts in the acute setting, with prolonged SNF level rehab recommended for post acute.     Recommendations for follow up therapy are one component of a multi-disciplinary discharge planning process, led by the attending physician.  Recommendations may be updated based on patient status, additional functional criteria and insurance authorization.    Follow Up Recommendations  Skilled nursing-short term rehab (<3 hours/day)    Assistance Recommended at Discharge Frequent or constant Supervision/Assistance  Patient can return home with the following  Two people to help with walking and/or transfers;Help with stairs or ramp for entrance;Direct supervision/assist for medications management;Assist for transportation;Assistance with feeding;A lot of help with bathing/dressing/bathroom   Equipment Recommendations  Wheelchair (measurements OT);Wheelchair cushion (measurements OT);BSC/3in1;Tub/shower seat;Hospital bed    Recommendations for Other Services      Precautions / Restrictions Precautions Precautions: Fall Precaution Comments: Skin integrity, incontinent of bowel Restrictions Weight Bearing Restrictions: No       Mobility Bed Mobility Overal bed mobility: Needs Assistance Bed  Mobility: Rolling, Supine to Sit, Sit to Supine Rolling: Min assist, Max assist         General bed mobility comments: min A to roll R and max A to roll left    Transfers                         Balance                                           ADL either performed or assessed with clinical judgement   ADL   Eating/Feeding: Set up;Bed level   Grooming: Wash/dry hands;Wash/dry face;Supervision/safety;Bed level               Lower Body Dressing: Maximal assistance;Bed level   Toilet Transfer: Total assistance   Toileting- Clothing Manipulation and Hygiene: Total assistance;Bed level              Extremity/Trunk Assessment Upper Extremity Assessment Upper Extremity Assessment: Generalized weakness RUE Deficits / Details: chronic R shoulder dysfunction   Lower Extremity Assessment Lower Extremity Assessment: Defer to PT evaluation                          Cognition Arousal/Alertness: Awake/alert Behavior During Therapy: WFL for tasks assessed/performed, Flat affect Overall Cognitive Status: Within Functional Limits for tasks assessed  General Comments  VSS    Pertinent Vitals/ Pain       Pain Assessment Pain Assessment: Faces Faces Pain Scale: Hurts little more Pain Location: Abdominal pain and groin, especially with rolling/moving; Grimace with pericare Pain Descriptors / Indicators: Guarding, Grimacing, Discomfort Pain Intervention(s): Limited activity within patient's tolerance                                                          Frequency  Min 2X/week        Progress Toward Goals  OT Goals(current goals can now be found in the care plan section)  Progress towards OT goals: Goals drowngraded-see care plan  Acute Rehab OT Goals OT Goal Formulation: With patient Time For Goal Achievement:  09/28/22 Potential to Achieve Goals: Fair ADL Goals Pt Will Perform Grooming: with supervision;sitting Pt Will Perform Upper Body Bathing: with supervision;sitting Pt Will Perform Upper Body Dressing: with supervision;sitting Pt Will Transfer to Toilet: with mod assist;stand pivot transfer;bedside commode Pt/caregiver will Perform Home Exercise Program: Increased strength;Both right and left upper extremity;With minimal assist  Plan Discharge plan remains appropriate    Co-evaluation                 AM-PAC OT "6 Clicks" Daily Activity     Outcome Measure   Help from another person eating meals?: A Little Help from another person taking care of personal grooming?: A Little Help from another person toileting, which includes using toliet, bedpan, or urinal?: Total Help from another person bathing (including washing, rinsing, drying)?: A Lot Help from another person to put on and taking off regular upper body clothing?: A Lot Help from another person to put on and taking off regular lower body clothing?: A Lot 6 Click Score: 13    End of Session    OT Visit Diagnosis: Unsteadiness on feet (R26.81);Muscle weakness (generalized) (M62.81);History of falling (Z91.81)   Activity Tolerance Patient limited by pain;Patient limited by fatigue   Patient Left in bed;with call bell/phone within reach;with nursing/sitter in room   Nurse Communication Mobility status        Time: 7588-3254 OT Time Calculation (min): 20 min  Charges: OT General Charges $OT Visit: 1 Visit OT Treatments $Self Care/Home Management : 8-22 mins  09/14/2022  RP, OTR/L  Acute Rehabilitation Services  Office:  [phone]   Metta Clines 09/14/2022, 8:43 AM

## 2022-09-15 ENCOUNTER — Inpatient Hospital Stay (HOSPITAL_COMMUNITY): Payer: Medicare HMO

## 2022-09-15 DIAGNOSIS — R945 Abnormal results of liver function studies: Secondary | ICD-10-CM | POA: Diagnosis not present

## 2022-09-15 DIAGNOSIS — R1084 Generalized abdominal pain: Secondary | ICD-10-CM | POA: Diagnosis not present

## 2022-09-15 DIAGNOSIS — G8929 Other chronic pain: Secondary | ICD-10-CM | POA: Diagnosis not present

## 2022-09-15 DIAGNOSIS — K529 Noninfective gastroenteritis and colitis, unspecified: Secondary | ICD-10-CM | POA: Diagnosis not present

## 2022-09-15 DIAGNOSIS — K269 Duodenal ulcer, unspecified as acute or chronic, without hemorrhage or perforation: Secondary | ICD-10-CM | POA: Diagnosis not present

## 2022-09-15 DIAGNOSIS — R109 Unspecified abdominal pain: Secondary | ICD-10-CM | POA: Diagnosis not present

## 2022-09-15 LAB — GLUCOSE, CAPILLARY
Glucose-Capillary: 153 mg/dL — ABNORMAL HIGH (ref 70–99)
Glucose-Capillary: 132 mg/dL — ABNORMAL HIGH (ref 70–99)
Glucose-Capillary: 106 mg/dL — ABNORMAL HIGH (ref 70–99)
Glucose-Capillary: 84 mg/dL (ref 70–99)
Glucose-Capillary: 109 mg/dL — ABNORMAL HIGH (ref 70–99)

## 2022-09-15 MED ORDER — DEXTROSE IN LACTATED RINGERS 5 % IV SOLN: INTRAVENOUS | Status: AC

## 2022-09-15 MED ORDER — PANCRELIPASE (LIP-PROT-AMYL) 10440-39150 UNITS PO TABS
20880.0000 [IU] | ORAL_TABLET | Freq: Once | ORAL | Status: AC
  Administered 2022-09-15: 20880 [IU]
  Filled 2022-09-15 (×2): qty 2

## 2022-09-15 MED ORDER — SODIUM BICARBONATE 650 MG PO TABS
650.0000 mg | ORAL_TABLET | Freq: Once | ORAL | Status: AC
  Administered 2022-09-15: 650 mg
  Filled 2022-09-15: qty 1

## 2022-09-15 MED ORDER — OSMOLITE 1.2 CAL PO LIQD
1000.0000 mL | ORAL | Status: DC
  Filled 2022-09-15 (×3): qty 1000

## 2022-09-15 NOTE — Progress Notes (Signed)
Subjective:   Summary: Alicia [name] is a 71 y.o. year old female currently admitted on the IMTS HD#22 for diffuse abdominal pain as well as severely decreased PO intake likely multifactorial in etiology due to pancreatic insufficiency, chronic IMA occlusion, duodenal ulcers.  Overnight Events:   -NAEON.  Tolerating tube feeds at 30 mL/h -tolerating some PO intake yesterday  -Encouraged her to continue p.o. intake and to eat more than yesterday.  Goal is to have at least a little bit of solid food and 1 cup of water with each meal.  She still seems somewhat reluctant.  And is endorsing no appetite.  Her abdominal pain, diarrhea, nausea are well controlled at this point. Objective:  Vital signs in last 24 hours: Vitals:   09/14/22 0837 09/14/22 1800 09/14/22 2045 09/15/22 0511  BP: (!) 156/78 (!) 151/88 (!) 132/39 137/74  Pulse: 73 76 80 71  Resp:  20 18 18   Temp: (!) 97.5 F (36.4 C) (!) 97.1 F (36.2 C) 98 F (36.7 C) (!) 97.5 F (36.4 C)  TempSrc: Oral Oral Oral Oral  SpO2: 100% 99% 99% 100%  Weight:      Height:       Supplemental O2: Room Air    Physical Exam:  Constitutional: NAD HEENT: Feeding tube in place Cardiovascular: RRR, no murmurs, rubs, or gallops Abdominal: Bowel sounds diminished but present, minimal diffuse TTP, no guarding or rebound.  Distended but soft.  Periumbilical hernia unchanged.  Extremities: no lower extremity edema present. Neuro/Psych: Flat affect, interacts and answers questions but is minimally verbal.   Intake/Output Summary (Last 24 hours) at 09/15/2022 0721 Last data filed at 09/14/2022 2100 Gross per 24 hour  Intake --  Output 0 ml  Net 0 ml    Net IO Since Admission: 19,487.3 mL [09/15/22 0721]  Pertinent Labs: Unable to obtain labs today after multiple attempts    Latest Ref Rng & Units 09/14/2022    8:09 AM 09/13/2022    4:51 AM 09/12/2022    6:34 AM  CBC  WBC 4.0 - 10.5 K/uL 10.1  8.6  7.4    Hemoglobin 12.0 - 15.0 g/dL 10.6  12.0  11.2   Hematocrit 36.0 - 46.0 % 30.9  35.8  32.7   Platelets 150 - 400 K/uL 260  277  264        Latest Ref Rng & Units 09/14/2022    8:09 AM 09/13/2022    4:51 AM 09/12/2022    6:34 AM  CMP  Glucose 70 - 99 mg/dL 183  175  203   BUN 8 - 23 mg/dL 52  49  45   Creatinine 0.44 - 1.00 mg/dL 3.70  3.76  3.72   Sodium 135 - 145 mmol/L 133  136  136   Potassium 3.5 - 5.1 mmol/L 5.3  4.5  4.2   Chloride 98 - 111 mmol/L 105  103  102   CO2 22 - 32 mmol/L 16  19  22    Calcium 8.9 - 10.3 mg/dL 7.5  7.6  7.5   Total Protein 6.5 - 8.1 g/dL 4.6  4.9  5.0   Total Bilirubin 0.3 - 1.2 mg/dL 0.6  0.6  0.7   Alkaline Phos 38 - 126 U/L 579  584  578   AST 15 - 41 U/L 107  101  137   ALT 0 - 44 U/L  146  146  149    Last 3 CBGs 153, 171, 162   Assessment/Plan:   Patient Summary: Alicia [name] is a 71 y.o. with a pertinent PMH of HFpEF, HTN, DMII, CKD4, PAD, prior CVA, who presented with progressive weakness and post-prandial abdominal pain and admitted for decreased functional ability, diffuse abdominal pain, and chronic diarrhea likely multifactorial in etiology due to pancreatic insufficiency, IMA occlusion, duodenal ulcers.   #Abdominal Pain of Unkown Etiology #Chronic diarrhea #IMA occlusion #Duodenal ulcers #Pancreatic insufficiency #Lack of PO Intake  #Possible food aversion Continuing medical management for her multifactorial abdominal symptoms.  She has only been on Remeron 15 mg nightly for 2 days, but does not seem to have much appetite stimulation still. She did have a little bit to eat yesterday but not nearly enough to sustain herself.  Her nausea, diarrhea, abdominal pain are well managed.  Encouraged her to increase p.o. intake is much as possible, at least a little bit of solids and 1 cup of water with each meal.  If she is unable to do so we will need to consider PEG tube placement, though we would prefer not to do this if at all  possible.  We will additionally go up to 40 mL/h on tube feeds.  We will also look into right upper quadrant ultrasound due to persistently elevated LFTs and initially distended gallbladder on presentation.  Low suspicion of acalculous cholecystitis or acute calculous cholecystitis at this time. Plan:  - Increase p.o. intake today - PRN Imodium for diarrhea. - Increase tube feeds to 40 mL/hour  - Continue ASA 81 and continue Crestor 10 for IMA lesion.  No vascular intervention indicated - Continue Remeron at 15 mg nightly.  Can switch to Zyprexa if needed down the line. - Continue Creon for pancreatic insufficiency - Continue Protonix 40 mg twice daily, Carafate 1 g every 6 hours - Following phosphorous, magnesium, and potassium to monitor for refeeding syndrome -continue to monitor Alk Phos, AST, ALT.  Will obtain right upper quadrant ultrasound today.  #AKI on CKD4 Creatinine cannot be assessed today. Nephrology is on board and stated that this could be most likely due to prolonged pre-renal state leading to tubular injury, and have recommended IV hydration, and monitoring daily Cr. At this point, no indication for hemodialysis. - Appreciate nephrology for assistance. - Avoid nephrotoxic medications - IV hydration, electrolyte repletion as needed  Code: Full Dispo: Pending symptomatic improvement and increased PO intake vs. PEG placement.  Linus Galas MD PGY-1 Internal Medicine Resident Pager: [phone] Please contact the on call pager after 5 pm and on weekends at [phone].

## 2022-09-15 NOTE — Progress Notes (Signed)
Still unable to unclog coretrack, attempted to contact dietician x 2 without success, will contact coretrack team tomorrow, MD aware of all of the above

## 2022-09-15 NOTE — Progress Notes (Signed)
Core track clogged, unable to resolve despite multiple attempts.  Added on D5 LR at 100 mL/h as maintenance for now until this can be further evaluated tomorrow morning by core track team.  Patient still not taking in much p.o.  Otherwise stable.

## 2022-09-15 NOTE — Progress Notes (Signed)
Attempted to give pt. Her medications orally, pt. Refused all medications, oral intake encouraged and pt. Cont. To refuse  Alicia [name]

## 2022-09-15 NOTE — Progress Notes (Signed)
Physical Therapy Treatment Patient Details Name: Alicia [name] MRN: [id number] DOB: 12-13-1950 Today's Date: 09/15/2022   History of Present Illness 71 y.o. female presented to ED 10/4 with FTT. Cortrak placed 10/18 PMhx: CHf, HTN, CVA, T2DM, CKD, vit D deficiency, PAD, depression, HFpEF    PT Comments    Patient not making progress towards goals. Has had a significant decline in function compared to initial evaluation. Goals downgraded based on current level of function. Currently requiring maxA+2 for rolling and limited participation in therex at bed level. Patient and family would benefit from Palliative consult to discuss goals of care due to patient's significant decline. Continue to recommend SNF for ongoing Physical Therapy.       Recommendations for follow up therapy are one component of a multi-disciplinary discharge planning process, led by the attending physician.  Recommendations may be updated based on patient status, additional functional criteria and insurance authorization.  Follow Up Recommendations  Skilled nursing-short term rehab (<3 hours/day) Can patient physically be transported by private vehicle: No   Assistance Recommended at Discharge Frequent or constant Supervision/Assistance  Patient can return home with the following Assistance with cooking/housework;Direct supervision/assist for medications management;Assist for transportation;A lot of help with bathing/dressing/bathroom;Two people to help with walking and/or transfers;Help with stairs or ramp for entrance   Equipment Recommendations  Wheelchair (measurements PT);Wheelchair cushion (measurements PT);Hospital bed (hoyer lift)    Recommendations for Other Services Other (comment) (Palliative Consult)     Precautions / Restrictions Precautions Precautions: Fall Precaution Comments: Skin integrity, incontinent of bowel Restrictions Weight Bearing Restrictions: No     Mobility  Bed  Mobility Overal bed mobility: Needs Assistance Bed Mobility: Rolling Rolling: Mod assist, +2 for physical assistance, +2 for safety/equipment         General bed mobility comments: assist to roll L/R for pericare due to bowel incontinence. Sat up in chair position to participate in AAROM exercises    Transfers                        Ambulation/Gait                   Stairs             Wheelchair Mobility    Modified Rankin (Stroke Patients Only)       Balance                                            Cognition Arousal/Alertness: Awake/alert Behavior During Therapy: WFL for tasks assessed/performed, Flat affect Overall Cognitive Status: Impaired/Different from baseline Area of Impairment: Following commands, Safety/judgement, Problem solving                       Following Commands: Follows one step commands inconsistently, Follows one step commands with increased time Safety/Judgement: Decreased awareness of safety, Decreased awareness of deficits   Problem Solving: Slow processing, Requires verbal cues          Exercises General Exercises - Lower Extremity Short Arc Quad: AAROM, Both, 5 reps Heel Slides: AAROM, Both, 5 reps Hip ABduction/ADduction: AAROM, Both, 5 reps    General Comments        Pertinent Vitals/Pain Pain Assessment Pain Assessment: Faces Faces Pain Scale: Hurts even more Pain Location: buttock Pain Descriptors / Indicators: Guarding, Grimacing, Discomfort Pain Intervention(s): Monitored  during session    Home Living                          Prior Function            PT Goals (current goals can now be found in the care plan section) Acute Rehab PT Goals PT Goal Formulation: With patient Time For Goal Achievement: 09/29/22 Potential to Achieve Goals: Poor Progress towards PT goals: Not progressing toward goals - comment    Frequency    Min 2X/week      PT  Plan Current plan remains appropriate    Co-evaluation              AM-PAC PT "6 Clicks" Mobility   Outcome Measure  Help needed turning from your back to your side while in a flat bed without using bedrails?: Total Help needed moving from lying on your back to sitting on the side of a flat bed without using bedrails?: Total Help needed moving to and from a bed to a chair (including a wheelchair)?: Total Help needed standing up from a chair using your arms (e.g., wheelchair or bedside chair)?: Total Help needed to walk in hospital room?: Total Help needed climbing 3-5 steps with a railing? : Total 6 Click Score: 6    End of Session   Activity Tolerance: Patient limited by fatigue;Patient tolerated treatment well Patient left: in bed;with call bell/phone within reach Nurse Communication: Mobility status PT Visit Diagnosis: Other abnormalities of gait and mobility (R26.89);History of falling (Z91.81);Muscle weakness (generalized) (M62.81)     Time: 8101-7510 PT Time Calculation (min) (ACUTE ONLY): 16 min  Charges:  $Therapeutic Exercise: 8-22 mins                     Conner Muegge A. Gilford Rile PT, DPT Acute Rehabilitation Services Office [phone]    Linna Hoff 09/15/2022, 4:35 PM

## 2022-09-15 NOTE — Progress Notes (Signed)
Nutrition encouraged and pt. Refused, unable to unclog coretrack tube at present, MD aware, pt. NPO at present for CT scan per CT, will cont. To monitor

## 2022-09-15 NOTE — Progress Notes (Signed)
Occupational Therapy Treatment Patient Details Name: Alicia [name] MRN: [id number] DOB: 08/10/1951 Today's Date: 09/15/2022   History of present illness 71 y.o. female presented to ED 10/4 with FTT. Cortrak placed 10/18 PMhx: CHf, HTN, CVA, T2DM, CKD, vit D deficiency, PAD, depression, HFpEF   OT comments  Patient care plan reviewed and goals downgraded.  She is declining quickly with respect to mobility, ADL and strength.  Patient is nearing total assist for ADL completion at bedlevel.  OT can continue, but she will need a prolonged rehab stay, and may have to transition to long term care.     Recommendations for follow up therapy are one component of a multi-disciplinary discharge planning process, led by the attending physician.  Recommendations may be updated based on patient status, additional functional criteria and insurance authorization.    Follow Up Recommendations  Skilled nursing-short term rehab (<3 hours/day)    Assistance Recommended at Discharge Frequent or constant Supervision/Assistance  Patient can return home with the following  Two people to help with walking and/or transfers;Help with stairs or ramp for entrance;Direct supervision/assist for medications management;Assist for transportation;Assistance with feeding;A lot of help with bathing/dressing/bathroom   Equipment Recommendations  Wheelchair (measurements OT);Wheelchair cushion (measurements OT);BSC/3in1;Tub/shower seat;Hospital bed    Recommendations for Other Services      Precautions / Restrictions Precautions Precautions: Fall Precaution Comments: Skin integrity, incontinent of bowel Restrictions Weight Bearing Restrictions: No       Mobility Bed Mobility Overal bed mobility: Needs Assistance Bed Mobility: Rolling Rolling: Mod assist, Max assist              Transfers                         Balance                                           ADL  either performed or assessed with clinical judgement   ADL   Eating/Feeding: Moderate assistance;Bed level Eating/Feeding Details (indicate cue type and reason): full liquid Grooming: Wash/dry hands;Wash/dry face;Bed level;Moderate assistance   Upper Body Bathing: Maximal assistance;Bed level   Lower Body Bathing: Total assistance;Bed level   Upper Body Dressing : Maximal assistance;Bed level   Lower Body Dressing: Total assistance;Bed level   Toilet Transfer: Total assistance   Toileting- Clothing Manipulation and Hygiene: Total assistance;Bed level              Extremity/Trunk Assessment Upper Extremity Assessment Upper Extremity Assessment: Generalized weakness;RUE deficits/detail;LUE deficits/detail RUE Deficits / Details: chronic R shoulder dysfunction, difficulty with anything above 90 degrees.  Unable to touch her nose RUE Sensation: WNL RUE Coordination: WNL LUE Deficits / Details: Generalized weakness throughout.  Cannot touch her nose. LUE Sensation: WNL LUE Coordination: WNL   Lower Extremity Assessment Lower Extremity Assessment: Defer to PT evaluation   Cervical / Trunk Assessment Cervical / Trunk Assessment: Other exceptions Cervical / Trunk Exceptions: body habitus    Vision Patient Visual Report: No change from baseline     Perception     Praxis      Cognition Arousal/Alertness: Awake/alert Behavior During Therapy: WFL for tasks assessed/performed, Flat affect Overall Cognitive Status: Within Functional Limits for tasks assessed  Exercises General Exercises - Upper Extremity Shoulder Flexion: AAROM, Both, 10 reps, Supine, Other (comment) Shoulder Extension: AAROM, Both, 10 reps, Supine, Other (comment) Elbow Flexion: AROM, Both, 10 reps, Supine Elbow Extension: AAROM, 10 reps, Supine    Shoulder Instructions       General Comments      Pertinent Vitals/ Pain       Pain  Assessment Pain Assessment: Faces Faces Pain Scale: Hurts even more Pain Location: Abdominal pain and groin, especially with rolling/moving; Grimace with pericare Pain Descriptors / Indicators: Guarding, Grimacing, Discomfort Pain Intervention(s): Monitored during session                                                          Frequency  Min 2X/week        Progress Toward Goals  OT Goals(current goals can now be found in the care plan section)  Progress towards OT goals: Not progressing toward goals - comment (Declining in all aspects of strength, ADL and mobility)  Acute Rehab OT Goals OT Goal Formulation: With patient Time For Goal Achievement: 09/28/22 Potential to Achieve Goals: Fair ADL Goals Pt Will Perform Grooming: with min assist;sitting Pt Will Perform Upper Body Bathing: with min assist;bed level Pt Will Perform Upper Body Dressing: with min assist;bed level  Plan Discharge plan remains appropriate    Co-evaluation                 AM-PAC OT "6 Clicks" Daily Activity     Outcome Measure   Help from another person eating meals?: A Lot Help from another person taking care of personal grooming?: A Lot Help from another person toileting, which includes using toliet, bedpan, or urinal?: Total Help from another person bathing (including washing, rinsing, drying)?: A Lot Help from another person to put on and taking off regular upper body clothing?: A Lot Help from another person to put on and taking off regular lower body clothing?: A Lot 6 Click Score: 11    End of Session    OT Visit Diagnosis: Unsteadiness on feet (R26.81);Muscle weakness (generalized) (M62.81);History of falling (Z91.81)   Activity Tolerance Patient limited by pain;Patient limited by fatigue   Patient Left in bed;with call bell/phone within reach   Nurse Communication          Time: 1025-1047 OT Time Calculation (min): 22 min  Charges: OT General  Charges $OT Visit: 1 Visit OT Treatments $Self Care/Home Management : 8-22 mins  09/15/2022  RP, OTR/L  Acute Rehabilitation Services  Office:  [phone]   Metta Clines 09/15/2022, 10:55 AM

## 2022-09-15 NOTE — Plan of Care (Signed)

## 2022-09-16 DIAGNOSIS — K269 Duodenal ulcer, unspecified as acute or chronic, without hemorrhage or perforation: Secondary | ICD-10-CM | POA: Diagnosis not present

## 2022-09-16 DIAGNOSIS — R1084 Generalized abdominal pain: Secondary | ICD-10-CM | POA: Diagnosis not present

## 2022-09-16 DIAGNOSIS — K529 Noninfective gastroenteritis and colitis, unspecified: Secondary | ICD-10-CM | POA: Diagnosis not present

## 2022-09-16 DIAGNOSIS — G8929 Other chronic pain: Secondary | ICD-10-CM | POA: Diagnosis not present

## 2022-09-16 LAB — CBC
HCT: 30 % — ABNORMAL LOW (ref 36.0–46.0)
Hemoglobin: 10.4 g/dL — ABNORMAL LOW (ref 12.0–15.0)
MCH: 31.9 pg (ref 26.0–34.0)
MCHC: 34.7 g/dL (ref 30.0–36.0)
MCV: 92 fL (ref 80.0–100.0)
Platelets: 269 10*3/uL (ref 150–400)
RBC: 3.26 MIL/uL — ABNORMAL LOW (ref 3.87–5.11)
RDW: 17.4 % — ABNORMAL HIGH (ref 11.5–15.5)
WBC: 9.1 10*3/uL (ref 4.0–10.5)
nRBC: 0 % (ref 0.0–0.2)

## 2022-09-16 LAB — COMPREHENSIVE METABOLIC PANEL
ALT: 112 U/L — ABNORMAL HIGH (ref 0–44)
AST: 54 U/L — ABNORMAL HIGH (ref 15–41)
Albumin: 1.7 g/dL — ABNORMAL LOW (ref 3.5–5.0)
Alkaline Phosphatase: 515 U/L — ABNORMAL HIGH (ref 38–126)
Anion gap: 13 (ref 5–15)
BUN: 55 mg/dL — ABNORMAL HIGH (ref 8–23)
CO2: 17 mmol/L — ABNORMAL LOW (ref 22–32)
Calcium: 7.7 mg/dL — ABNORMAL LOW (ref 8.9–10.3)
Chloride: 104 mmol/L (ref 98–111)
Creatinine, Ser: 3.53 mg/dL — ABNORMAL HIGH (ref 0.44–1.00)
GFR, Estimated: 13 mL/min — ABNORMAL LOW (ref >60)
Glucose, Bld: 90 mg/dL (ref 70–99)
Potassium: 4.5 mmol/L (ref 3.5–5.1)
Sodium: 134 mmol/L — ABNORMAL LOW (ref 135–145)
Total Bilirubin: 0.6 mg/dL (ref 0.3–1.2)
Total Protein: 4.6 g/dL — ABNORMAL LOW (ref 6.5–8.1)

## 2022-09-16 LAB — GLUCOSE, CAPILLARY
Glucose-Capillary: 102 mg/dL — ABNORMAL HIGH (ref 70–99)
Glucose-Capillary: 113 mg/dL — ABNORMAL HIGH (ref 70–99)
Glucose-Capillary: 88 mg/dL (ref 70–99)
Glucose-Capillary: 111 mg/dL — ABNORMAL HIGH (ref 70–99)
Glucose-Capillary: 87 mg/dL (ref 70–99)
Glucose-Capillary: 140 mg/dL — ABNORMAL HIGH (ref 70–99)
Glucose-Capillary: 178 mg/dL — ABNORMAL HIGH (ref 70–99)

## 2022-09-16 LAB — MAGNESIUM: Magnesium: 2.1 mg/dL (ref 1.7–2.4)

## 2022-09-16 LAB — PHOSPHORUS: Phosphorus: 2.9 mg/dL (ref 2.5–4.6)

## 2022-09-16 MED ORDER — METOCLOPRAMIDE HCL 5 MG/5ML PO SOLN
10.0000 mg | Freq: Three times a day (TID) | ORAL | Status: DC
  Administered 2022-09-16 – 2022-09-20 (×13): 10 mg
  Filled 2022-09-16 (×18): qty 10

## 2022-09-16 MED ORDER — RENA-VITE PO TABS
1.0000 | ORAL_TABLET | Freq: Every day | ORAL | Status: DC
  Administered 2022-09-17 – 2022-09-30 (×12): 1
  Filled 2022-09-16 (×13): qty 1

## 2022-09-16 MED ORDER — METOCLOPRAMIDE HCL 5 MG/ML IJ SOLN
10.0000 mg | Freq: Once | INTRAMUSCULAR | Status: AC
  Administered 2022-09-16: 10 mg via INTRAVENOUS
  Filled 2022-09-16: qty 2

## 2022-09-16 MED ORDER — DEXTROSE IN LACTATED RINGERS 5 % IV SOLN: INTRAVENOUS | Status: AC

## 2022-09-16 NOTE — Progress Notes (Signed)
Subjective:   Summary: Alicia [name] is a 71 y.o. year old female currently admitted on the IMTS HD#23 for diffuse abdominal pain as well as severely decreased PO intake likely multifactorial in etiology due to pancreatic insufficiency, chronic IMA occlusion, duodenal ulcers.  Overnight Events:   -tube feeds stopped after Cortrak clogged yesterday -1 episode of moderate abdominal pain ON. Assessed by ON team, given '10mg'$  reglan with resolution. -declining PO intake, oral meds ON and this morning -Encouraged her to continue p.o. intake and to eat more than yesterday.  Goal is to have at least a little bit of solid food and 1 cup of water with each meal.  She still seems somewhat reluctant.  And is endorsing no appetite.  Her abdominal pain, diarrhea, nausea are well controlled at this point. Objective:  Vital signs in last 24 hours: Vitals:   09/15/22 2041 09/16/22 0500 09/16/22 0536 09/16/22 0853  BP: (!) 141/75   (!) 151/73  Pulse: 80   83  Resp: 18   17  Temp: 98.3 F (36.8 C)   98.3 F (36.8 C)  TempSrc: Oral   Oral  SpO2: 100%   100%  Weight:  115.7 kg 115.7 kg   Height:       Supplemental O2: Room Air    Physical Exam:  Constitutional: NAD HEENT: Feeding tube in place Cardiovascular: RRR, no murmurs, rubs, or gallops Abdominal: Bowel sounds diminished but present, minimal diffuse TTP, no guarding or rebound.  Distended but soft.  Periumbilical hernia unchanged.  Extremities: no lower extremity edema present. Neuro/Psych: Flat affect, interacts and answers questions but is minimally verbal.   Intake/Output Summary (Last 24 hours) at 09/16/2022 1456 Last data filed at 09/16/2022 1200 Gross per 24 hour  Intake 1535.5 ml  Output --  Net 1535.5 ml   Net IO Since Admission: 25,831.3 mL [09/16/22 1456]  Pertinent Labs: Unable to obtain labs today after multiple attempts    Latest Ref Rng & Units 09/16/2022   10:46 AM 09/14/2022    8:09 AM  09/13/2022    4:51 AM  CBC  WBC 4.0 - 10.5 K/uL 9.1  10.1  8.6   Hemoglobin 12.0 - 15.0 g/dL 10.4  10.6  12.0   Hematocrit 36.0 - 46.0 % 30.0  30.9  35.8   Platelets 150 - 400 K/uL 269  260  277        Latest Ref Rng & Units 09/16/2022   10:46 AM 09/14/2022    8:09 AM 09/13/2022    4:51 AM  CMP  Glucose 70 - 99 mg/dL 90  183  175   BUN 8 - 23 mg/dL 55  52  49   Creatinine 0.44 - 1.00 mg/dL 3.53  3.70  3.76   Sodium 135 - 145 mmol/L 134  133  136   Potassium 3.5 - 5.1 mmol/L 4.5  5.3  4.5   Chloride 98 - 111 mmol/L 104  105  103   CO2 22 - 32 mmol/L '17  16  19   '$ Calcium 8.9 - 10.3 mg/dL 7.7  7.5  7.6   Total Protein 6.5 - 8.1 g/dL 4.6  4.6  4.9   Total Bilirubin 0.3 - 1.2 mg/dL 0.6  0.6  0.6   Alkaline Phos 38 - 126 U/L 515  579  584   AST 15 - 41 U/L 54  107  101  ALT 0 - 44 U/L 112  146  146   Mg 2.1, Phos 2.9 Last 3 CBGs 113, 102, 109   Assessment/Plan:   Patient Summary: Alicia [name] is a 71 y.o. with a pertinent PMH of HFpEF, HTN, DMII, CKD4, PAD, prior CVA, who presented with progressive weakness and post-prandial abdominal pain and admitted for decreased functional ability, diffuse abdominal pain, and chronic diarrhea likely multifactorial in etiology due to pancreatic insufficiency, IMA occlusion, duodenal ulcers.   #Abdominal Pain of Unkown Etiology #Chronic diarrhea #IMA occlusion #Duodenal ulcers #Pancreatic insufficiency #Lack of PO Intake  #Possible food aversion Continuing medical management for her multifactorial abdominal symptoms.  She has only been on Remeron 15 mg nightly for 2 days, but does not seem to have much appetite stimulation still. Cortrak clogged yesterday, 110m/hr D5LR started, but Cortrak was patent this morning on assessment, feeds restarted at 40 ml/hr. 1 episode of abd pain ON which was managed with reglan. RUQ study unrevealing. Will stop ASA 81 today d\t concern about exacerbating duodenal ulcers and lower suspicion of IMA  occlusion as a major contributing factor to clinical picture. Will also schedule reglan 10 mg TID WC. If PO intake does not improve over next few days, will have to involve palliative care and consider PEG tube. Plan:  - Increase p.o. intake today - PRN Imodium for diarrhea. - tube feeds at 40 mL/hour  - d/c ASA 81 and continue Crestor 10 for possibile IMA pathology.  -schedule reglan '10mg'$  TID WC  - Continue Remeron at 15 mg nightly.  Can switch to Zyprexa if needed down the line, but need to allow for sufficient time to trial remeron. - Continue Creon for pancreatic insufficiency - Continue Protonix 40 mg twice daily, Carafate 1 g every 6 hours - Following phosphorous, magnesium, and potassium to monitor for refeeding syndrome  #AKI on CKD4 Creatinine 3.53 today. Nephrology is on board and stated that this could be most likely due to prolonged pre-renal state leading to tubular injury, and have recommended IV hydration, and monitoring daily Cr. At this point, no indication for hemodialysis. - Appreciate nephrology for assistance. - Avoid nephrotoxic medications - IV hydration, electrolyte repletion as needed  Code: Full Dispo: Pending symptomatic improvement and increased PO intake vs. PEG placement.  SLinus GalasMD PGY-1 Internal Medicine Resident Pager: [phone] contact the on call pager after 5 pm and on weekends at [phone]

## 2022-09-16 NOTE — TOC Progression Note (Signed)
Transition of Care [facility]) - Initial/Assessment Note    Patient Details  Name: Alicia [name] MRN: [id number] Date of Birth: 1951/03/14  Transition of [facility]) CM/SW Contact:    Milinda Antis, [place] Phone Number: 09/16/2022, 2:40 PM  Clinical Narrative:                 Patient still has cortrak which is a barrier to SNF placement.  LCSW will continue to follow and restart SNF search once patient is closer to being medically ready.   Expected Discharge Plan: Skilled Nursing Facility Barriers to Discharge: Insurance Authorization, SNF Pending bed offer, Continued Medical Work up   Patient Goals and CMS Choice Patient states their goals for this hospitalization and ongoing recovery are:: Pt wants to be able to return home. CMS Medicare.gov Compare Post Acute Care list provided to:: Patient Choice offered to / list presented to : Patient  Expected Discharge Plan and Services Expected Discharge Plan: [place] Acute Care Choice: [place] arrangements for the past 2 months: Single Family Home                                      Prior Living Arrangements/Services Living arrangements for the past 2 months: Single Family Home Lives with:: Self Patient language and need for interpreter reviewed:: Yes Do you feel safe going back to the place where you live?: Yes      Need for Family Participation in Patient Care: Yes (Comment) Care giver support system in place?: No (comment)   Criminal Activity/Legal Involvement Pertinent to Current Situation/Hospitalization: No - Comment as needed  Activities of Daily Living Home Assistive Devices/Equipment: Walker (specify type) ADL Screening (condition at time of admission) Patient's cognitive ability adequate to safely complete daily activities?: Yes Is the patient deaf or have difficulty hearing?: No Does the patient have difficulty seeing, even when wearing  glasses/contacts?: No Does the patient have difficulty concentrating, remembering, or making decisions?: No Patient able to express need for assistance with ADLs?: Yes Does the patient have difficulty dressing or bathing?: Yes Independently performs ADLs?: No Communication: Independent Dressing (OT): Needs assistance Is this a change from baseline?: Pre-admission baseline Grooming: Needs assistance Is this a change from baseline?: Pre-admission baseline Feeding: Independent Bathing: Needs assistance Is this a change from baseline?: Pre-admission baseline Toileting: Independent with device (comment) In/Out Bed: Independent with device (comment) Walks in Home: Independent with device (comment) Does the patient have difficulty walking or climbing stairs?: Yes Weakness of Legs: Both Weakness of Arms/Hands: None  Permission Sought/Granted Permission sought to share information with : Family Supports Permission granted to share information with : No              Emotional Assessment Appearance:: Appears stated age Attitude/Demeanor/Rapport: Guarded Affect (typically observed): Flat Orientation: : Oriented to Self, Oriented to Place, Oriented to  Time, Oriented to Situation Alcohol / Substance Use: Not Applicable Psych Involvement: No (comment)  Admission diagnosis:  Generalized weakness [R53.1] Patient Active Problem List   Diagnosis Date Noted   Food aversion 09/09/2022   Hypoalbuminemia due to protein-calorie malnutrition ([place]) 09/09/2022   Duodenal ulcer 09/01/2022   Mucosal abnormality of stomach 09/01/2022   Mucosal abnormality of esophagus 09/01/2022   Chronic generalized abdominal pain 08/30/2022   Nausea and vomiting    Diarrhea    Pressure injury of skin 08/27/2022  Generalized weakness 08/24/2022   Acute exacerbation of congestive heart failure ([place]) 06/30/2022   Hypernatremia 06/30/2022   Hyperkalemia 06/17/2022   Normocytic anemia 06/17/2022   Intertrigo     Metabolic acidosis    Cellulitis 05/20/2022   Vitamin D deficiency 05/20/2022   Headache 02/16/2022   Disequilibrium 12/01/2021   (HFpEF) heart failure with preserved ejection fraction ([place]) 07/18/2021   Exacerbation of RAD (reactive airway disease) 02/27/2020   Acute worsening of stage 4 chronic kidney disease ([place]) 02/27/2020   Tobacco use 02/27/2020   Prolonged Q-T interval on ECG 04/27/2018   Diabetes mellitus type 2 in obese ([place]) 03/16/2018   Severe obesity (BMI >= 40) (H[facility])    CVA (cerebral vascular accident) ([place]) 03/15/2018   Peripheral vascular disease, unspecified ([place]) 11/30/2012   Hyperlipidemia 07/20/2007   Primary hypertension 07/20/2007   PCP:  Rick Duff, MD Pharmacy:   [facility] ([facility] [place] [address] [place] [address] Phone: [phone] Fax: [phone]     Social Determinants of Health (SDOH) Interventions    Readmission Risk Interventions    03/02/2020    1:08 PM  Readmission Risk Prevention Plan  Transportation Screening Complete  PCP or Specialist Appt within 5-7 Days Complete  Home Care Screening Complete  Medication Review (RN CM) Complete

## 2022-09-16 NOTE — Progress Notes (Signed)
Nutrition Follow-up  DOCUMENTATION CODES:   Obesity unspecified  INTERVENTION:   - If PEG being considered, recommend pulling Cortrak back so that tip is gastric and pt able to demonstrate tolerance of gastric feeds prior to pursuing PEG  Resume tube feeding via post-pyloric Cortrak: - Osmolite 1.2 @ 40 ml/hr (960 ml/day) per MD - Recommend advancing tube feeding rate by 10 ml q 8 hours to GOAL rate of 60 ml/hr (1440 ml/day)  Tube feeding regimen at goal rate provides 1728 kcal, 80 grams of protein, and 912 ml of H2O.   - Renal MVI daily, thiamine 619 mg daily, folic acid 1 mg daily, and zinc sulfate 220 mg daily  NUTRITION DIAGNOSIS:   Inadequate oral intake related to inability to eat, nausea as evidenced by meal completion < 50%.  Ongoing, being addressed via TF  GOAL:   Patient will meet greater than or equal to 90% of their needs  Progressing with advancement of TF to goal  MONITOR:   PO intake, Supplement acceptance  REASON FOR ASSESSMENT:   Consult Assessment of nutrition requirement/status  ASSESSMENT:   71 y.o. female admits related to weakness. PMH includes: HTN, DM, CKD4, PAD. Pt is currently receiving medical management for weakness and abdominal pain.  10/12 -  s/p EGD showing duodenitis with duodenal ulcers, nodular mucosa and GEJ, altered mucosa in antrum and prepyloric region of stomach 10/18 - Cortrak placed (tip in fourth portion of duodenum) 10/26 - Cortrak tube became clogged 10/27 - Cortrak patent, diet advanced to Regular with thin liquids  Discussed pt with RN and MD. Janyce Llanos team came to unclog tube this morning but tube was flushing easily without intervention. Tube feeds resumed at 40 ml/hr via Cortrak (goal rate of 60 ml/hr).  Spoke with pt at bedside. Pt sleeping at time of RD visit but awoke to RD voice. Pt reports drinking some juice this morning. Pt reports that abdominal pain is much improved. She also reports one episode of nausea but  this resolved quickly. RD encouraged PO intake. Noted SLP evaluated pt today and diet advanced to regular with thin liquids.  Discussed with MD that Cortrak is post-pyloric. If PEG being considered, recommend pulling Cortrak back so that tip is gastric and pt able to demonstrate tolerance of gastric feeds prior to pursuing PEG.  Admit weight: 110 kg Current weight: 115.7 kg  Meal Completion: 0%  Medications reviewed and include: bentyl 10 mg QID, folic acid, SSI q 4 hours, semglee 6 units daily, remeron 15 mg daily, liquid MVI, creon 24,000 units TID, protonix, sodium bicarbonate 650 mg BID, sucralfate, thiamine, zinc sulfate 220 mg daily IVF: D5 in LR @ 100 ml/hr  Labs reviewed: potassium 5.3 on 10/25, BUN 52, creatinine 3.70, elevated LFTs CBG's: 84-113 x 24 hours  Diet Order:   Diet Order             Diet regular Room service appropriate? Yes; Fluid consistency: Thin  Diet effective now                   EDUCATION NEEDS:   Not appropriate for education at this time  Skin:  Skin Assessment: Skin Integrity Issues: Stage I: Medial sacrum  Last BM:  09/15/22 type 7  Height:   Ht Readings from Last 1 Encounters:  08/24/22 '5\' 8"'$  (1.727 m)    Weight:   Wt Readings from Last 1 Encounters:  09/16/22 115.7 kg    Ideal Body Weight:  63.6 kg  BMI:  Body mass index is 38.77 kg/m.  Estimated Nutritional Needs:   Kcal:  1700-1900 kcals  Protein:  80-95 gm  Fluid:  >/= 1.7 L    Gustavus Bryant, MS, RD, LDN Inpatient Clinical Dietitian Please see AMiON for contact information.

## 2022-09-16 NOTE — Progress Notes (Addendum)
Cortrak Tube Team Note:  Consult received for a clogged Cortrak feeding tube. This RD was able to flush tube clearly on first attempt to unclog. Then was able to flush again multiple times without any difficulty.  Tube is good to use.     Hermina Barters RD, LDN Clinical Dietitian See Shea Evans for contact information.

## 2022-09-16 NOTE — Progress Notes (Addendum)
Speech Language Pathology Treatment: Dysphagia  Patient Details Name: Alicia [name] MRN: [id number] DOB: 08-22-1951 Today's Date: 09/16/2022 Time: 3612-2449 SLP Time Calculation (min) (ACUTE ONLY): 15 min  Assessment / Plan / Recommendation Clinical Impression  Therapist notified that denture cream arrived in pt's room. Therapist assisted with placement of cream and upper plate which adhered well to hard palate. Minimal movement of plate during mastication however appeared functional. Prolonged mastication and pt requested tissue- when given positive feedback and encouragement that her dentures are fitting and to continue chewing, she again requested tissue to expectorate bolus stating "I just can't swallow it." MD appeared and also encouraged pt as well as reviewed options for nutrition at this point (po texture advancement or possible PEG tube). Therapist will upgrade texture to regular in hopes of ordering foods she thinks she can eat or wants to eat. Ensure her upper and lower dentures are donned with all meals. ST will sign off at this time as decreased po intake does not appear to be due to an oropharyngeal dysphagia (esophageal dysphagia not suspected at this time).   HPI HPI: 71 y.o. female presented to ED 10/4 with FTT. Cortrak placed 10/18 PMhx: CHf, HTN, CVA, T2DM, CKD, vit D deficiency, PAD, depression, HFpEF      SLP Plan  Discharge SLP treatment due to (comment)      Recommendations for follow up therapy are one component of a multi-disciplinary discharge planning process, led by the attending physician.  Recommendations may be updated based on patient status, additional functional criteria and insurance authorization.    Recommendations  Diet recommendations: Regular;Thin liquid Liquids provided via: Cup;Straw Medication Administration: Whole meds with puree Supervision: Staff to assist with self feeding Compensations: Slow rate;Small sips/bites Postural Changes  and/or Swallow Maneuvers: Seated upright 90 degrees;Upright 30-60 min after meal                Oral Care Recommendations: Oral care BID Follow Up Recommendations: No SLP follow up Assistance recommended at discharge: Intermittent Supervision/Assistance SLP Visit Diagnosis: Dysphagia, unspecified (R13.10) Plan: Discharge SLP treatment due to (comment)           Houston Siren  09/16/2022, 2:00 PM Orbie Pyo Ranya Fiddler M.Ed Product/process development scientist [phone]

## 2022-09-16 NOTE — Evaluation (Signed)
Clinical/Bedside Swallow Evaluation Patient Details  Name: Alicia [name] MRN: [id number] Date of Birth: Jun 25, 1951  Today's Date: 09/16/2022 Time: SLP Start Time (ACUTE ONLY): 1245 SLP Stop Time (ACUTE ONLY): 5397 SLP Time Calculation (min) (ACUTE ONLY): 21 min  Past Medical History:  Past Medical History:  Diagnosis Date   Allergy    Arthritis    Chronic headaches    Constipation    Depression    Diabetes mellitus    GERD (gastroesophageal reflux disease)    Hyperlipidemia    Hypertension    Obesity    PAD (peripheral artery disease) ([place])    stage 4    Stroke [facility])    Past Surgical History:  Past Surgical History:  Procedure Laterality Date   ABDOMINAL AORTOGRAM W/LOWER EXTREMITY N/A 12/17/2018   Procedure: ABDOMINAL AORTOGRAM W/LOWER EXTREMITY;  Surgeon: Waynetta Sandy, MD;  Location: [place] [facility];  Service: Cardiovascular;  Laterality: N/A;   ARTERY BIOPSY Left 03/17/2022   Procedure: BIOPSY TEMPORAL ARTERY;  Surgeon: Cherre Robins, MD;  Location: [place];  Service: Vascular;  Laterality: Left;   BIOPSY  09/01/2022   Procedure: BIOPSY;  Surgeon: Yetta Flock, MD;  Location: [facility];  Service: Gastroenterology;;   ESOPHAGOGASTRODUODENOSCOPY (EGD) WITH PROPOFOL N/A 09/01/2022   Procedure: ESOPHAGOGASTRODUODENOSCOPY (EGD) WITH PROPOFOL;  Surgeon: Yetta Flock, MD;  Location: [place];  Service: Gastroenterology;  Laterality: N/A;   FEMORAL BYPASS  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 Bridgett Larsson   PERIPHERAL VASCULAR ATHERECTOMY Left 12/17/2018   Procedure: PERIPHERAL VASCULAR ATHERECTOMY;  Surgeon: Waynetta Sandy, MD;  Location: [place] [facility];  Service: Cardiovascular;  Laterality: Left;   PERIPHERAL VASCULAR BALLOON ANGIOPLASTY Left 12/17/2018   Procedure: PERIPHERAL VASCULAR BALLOON ANGIOPLASTY;  Surgeon: Waynetta Sandy, MD;  Location: [place] [facility];  Service:  Cardiovascular;  Laterality: Left;   HPI:  71 y.o. female presented to ED 10/4 with FTT. Cortrak placed 10/18 PMhx: CHf, HTN, CVA, T2DM, CKD, vit D deficiency, PAD, depression, HFpEF    Assessment / Plan / Recommendation  Clinical Impression  Pt's dentures located in room however upper plate ill fitting and unit secretary has ordered denture cream for pt. Oromotor exam was unremarkable. She needed much encouragement to consume po's for assessment. Swallow initiation of puree and thin via straw appeared timely and coordinated from subjective view. No signs/sx of aspiration with single or multiple sips. Pt attempted to masticate graham cracker with only lower denture plate donned however requested napkin and expectorated pieces of cracker. RN reported giving meds via Cortak and those that could not be crushed, capsule opened into puree and reported pt would not take, Discussed various textures with pt and agreed to leave diet as full liquids, then reattempt solids once upper denture in place. SLP Visit Diagnosis: Dysphagia, unspecified (R13.10)    Aspiration Risk  Mild aspiration risk    Diet Recommendation Thin liquid;Other (Comment) (continue full liquids until denture cream present and upper plate fitting)   Liquid Administration via: Straw Medication Administration: Other (Comment) (Cortrak or whole in puree) Supervision: Staff to assist with self feeding Compensations: Slow rate;Small sips/bites Postural Changes: Seated upright at 90 degrees    Other  Recommendations Oral Care Recommendations: Oral care BID    Recommendations for follow up therapy are one component of a multi-disciplinary discharge planning process, led by the attending physician.  Recommendations may be updated based on patient status, additional functional criteria  and insurance authorization.  Follow up Recommendations  (TBD)      Assistance Recommended at Discharge Intermittent Supervision/Assistance  Functional  Status Assessment Patient has had a recent decline in their functional status and demonstrates the ability to make significant improvements in function in a reasonable and predictable amount of time.  Frequency and Duration min 2x/week  2 weeks       Prognosis Prognosis for Safe Diet Advancement: Good      Swallow Study   General Date of Onset: 08/24/22 HPI: 71 y.o. female presented to ED 10/4 with FTT. Cortrak placed 10/18 PMhx: CHf, HTN, CVA, T2DM, CKD, vit D deficiency, PAD, depression, HFpEF Type of Study: Bedside Swallow Evaluation Previous Swallow Assessment:  (none) Diet Prior to this Study: Thin liquids;Other (Comment) (full liquids) Temperature Spikes Noted: No Respiratory Status: Room air History of Recent Intubation: No Behavior/Cognition: Alert;Cooperative;Pleasant mood Oral Cavity Assessment: Within Functional Limits Oral Care Completed by SLP: No Oral Cavity - Dentition: Edentulous;Dentures, bottom;Dentures, top (upper do not fit- Hotel manager has ordered denture cream as of 1313 on 10/27) Vision: Functional for self-feeding Self-Feeding Abilities: Needs assist Patient Positioning:  (habitus and air bed interfere with positioning) Baseline Vocal Quality: Normal Volitional Cough: Weak Volitional Swallow: Able to elicit    Oral/Motor/Sensory Function Overall Oral Motor/Sensory Function: Within functional limits   Ice Chips Ice chips: Not tested   Thin Liquid Thin Liquid: Within functional limits Presentation: Straw    Nectar Thick Nectar Thick Liquid: Not tested   Honey Thick Honey Thick Liquid: Not tested   Puree Puree: Within functional limits   Solid     Solid:  (see impressions)      [place] Siren 09/16/2022,1:54 PM

## 2022-09-16 NOTE — Plan of Care (Signed)

## 2022-09-17 DIAGNOSIS — K269 Duodenal ulcer, unspecified as acute or chronic, without hemorrhage or perforation: Secondary | ICD-10-CM | POA: Diagnosis not present

## 2022-09-17 DIAGNOSIS — R1084 Generalized abdominal pain: Secondary | ICD-10-CM | POA: Diagnosis not present

## 2022-09-17 DIAGNOSIS — K529 Noninfective gastroenteritis and colitis, unspecified: Secondary | ICD-10-CM | POA: Diagnosis not present

## 2022-09-17 DIAGNOSIS — G8929 Other chronic pain: Secondary | ICD-10-CM | POA: Diagnosis not present

## 2022-09-17 LAB — COMPREHENSIVE METABOLIC PANEL
ALT: 103 U/L — ABNORMAL HIGH (ref 0–44)
AST: 71 U/L — ABNORMAL HIGH (ref 15–41)
Albumin: 1.7 g/dL — ABNORMAL LOW (ref 3.5–5.0)
Alkaline Phosphatase: 548 U/L — ABNORMAL HIGH (ref 38–126)
Anion gap: 11 (ref 5–15)
BUN: 56 mg/dL — ABNORMAL HIGH (ref 8–23)
CO2: 19 mmol/L — ABNORMAL LOW (ref 22–32)
Calcium: 7.6 mg/dL — ABNORMAL LOW (ref 8.9–10.3)
Chloride: 99 mmol/L (ref 98–111)
Creatinine, Ser: 3.53 mg/dL — ABNORMAL HIGH (ref 0.44–1.00)
GFR, Estimated: 13 mL/min — ABNORMAL LOW (ref >60)
Glucose, Bld: 137 mg/dL — ABNORMAL HIGH (ref 70–99)
Potassium: 6 mmol/L — ABNORMAL HIGH (ref 3.5–5.1)
Sodium: 129 mmol/L — ABNORMAL LOW (ref 135–145)
Total Bilirubin: 0.8 mg/dL (ref 0.3–1.2)
Total Protein: 4.4 g/dL — ABNORMAL LOW (ref 6.5–8.1)

## 2022-09-17 LAB — CBC
HCT: 29.2 % — ABNORMAL LOW (ref 36.0–46.0)
Hemoglobin: 10.3 g/dL — ABNORMAL LOW (ref 12.0–15.0)
MCH: 32.6 pg (ref 26.0–34.0)
MCHC: 35.3 g/dL (ref 30.0–36.0)
MCV: 92.4 fL (ref 80.0–100.0)
Platelets: 259 10*3/uL (ref 150–400)
RBC: 3.16 MIL/uL — ABNORMAL LOW (ref 3.87–5.11)
RDW: 17.7 % — ABNORMAL HIGH (ref 11.5–15.5)
WBC: 9.4 10*3/uL (ref 4.0–10.5)
nRBC: 0 % (ref 0.0–0.2)

## 2022-09-17 LAB — GLUCOSE, CAPILLARY
Glucose-Capillary: 134 mg/dL — ABNORMAL HIGH (ref 70–99)
Glucose-Capillary: 143 mg/dL — ABNORMAL HIGH (ref 70–99)
Glucose-Capillary: 156 mg/dL — ABNORMAL HIGH (ref 70–99)
Glucose-Capillary: 163 mg/dL — ABNORMAL HIGH (ref 70–99)
Glucose-Capillary: 201 mg/dL — ABNORMAL HIGH (ref 70–99)

## 2022-09-17 MED ORDER — OSMOLITE 1.5 CAL PO LIQD
1000.0000 mL | ORAL | Status: DC
  Administered 2022-09-18 – 2022-09-20 (×3): 1000 mL
  Filled 2022-09-17 (×2): qty 1185

## 2022-09-17 NOTE — Progress Notes (Signed)
HD#18 SUBJECTIVE:  Patient Summary: Alicia [name] is a 71 y.o. with a pertinent PMH of HFpEF, HTN, T2DM, CKD stage 4, PAD, hx CAV, who presented with progressive weakness and post-prandial abdominal pain and admitted for decreased functional ability, diffuse abdominal pain, and chronic diarrhea likely multifactorial in etiology.   Overnight Events: None  Interim History: Alicia [name] reports that she did try to eat some last nigth despite not having an appetite and that it went well. She continues to endorse not having an appetite but is willing to try and eat again today.  OBJECTIVE:  Vital Signs: Vitals:   09/16/22 2056 09/17/22 0412 09/17/22 0500 09/17/22 0912  BP: (!) 142/90 132/67  (!) 148/80  Pulse: 83 76  82  Resp: '16 18  18  '$ Temp: 97.9 F (36.6 C) 98 F (36.7 C)  98.1 F (36.7 C)  TempSrc: Oral     SpO2: 100%   100%  Weight:   115.5 kg   Height:       Supplemental O2: Room Air SpO2: 100 % O2 Flow Rate (L/min): 2 L/min  Filed Weights   09/16/22 0500 09/16/22 0536 09/17/22 0500  Weight: 115.7 kg 115.7 kg 115.5 kg     Intake/Output Summary (Last 24 hours) at 09/17/2022 1212 Last data filed at 09/17/2022 0545 Gross per 24 hour  Intake 0 ml  Output 300 ml  Net -300 ml   Net IO Since Admission: 25,591.3 mL [09/17/22 1212]  Physical Exam: Constitutional:Does not appear well, appears to have down mood. In no acute distress. Cardio:Regular rate and rhythm. No murmurs, rubs, or gallops. Pulm:Normal work of breathing on room air. Abdomen:Soft, non-distended, with mild tenderness to palpation diffusely. Skin:Warm and dry. Neuro:Alert and oriented x3. No focal deficit noted. Psych:Pleasant mood and affect.  Patient Lines/Drains/Airways Status     Active Line/Drains/Airways     Name Placement date Placement time Site Days   Peripheral IV 09/03/22 20 G 1.88" Anterior;Left;Upper Arm 09/03/22  1639  Arm  14   External Urinary Catheter 08/25/22  2000  --  23    Small Bore Feeding Tube 10 Fr. Left nare Marking at nare/corner of mouth 96 cm 09/07/22  0952  Left nare  10   Pressure Injury 08/25/22 Sacrum Medial Stage 1 -  Intact skin with non-blanchable redness of a localized area usually over a bony prominence. 08/25/22  2000  -- 23   Wound / Incision (Open or Dehisced) 09/08/22 Non-pressure wound Thigh Left;Posterior;Proximal 1cm by 1cm, non pressure wound 09/08/22  1200  Thigh  9   Wound / Incision (Open or Dehisced) 09/08/22 Non-pressure wound Thigh Posterior;Proximal;Right 1cm by 1cm non pressure wound 09/08/22  1200  Thigh  9             ASSESSMENT/PLAN:  Assessment: Principal Problem:   Chronic generalized abdominal pain Active Problems:   Acute worsening of stage 4 chronic kidney disease (HCC)   Generalized weakness   Pressure injury of skin   Nausea and vomiting   Diarrhea   Duodenal ulcer   Mucosal abnormality of stomach   Mucosal abnormality of esophagus   Food aversion   Hypoalbuminemia due to protein-calorie malnutrition (HCC)   Plan: #Abdominal Pain of Unkown Etiology #Chronic diarrhea #Duodenal ulcers #Pancreatic insufficiency #Lack of PO Intake  #Possible food aversion Currently medically managing abdominal complaints. Appetite does not seem to have improved with Remeron 15 mg nightly though she does endorse trying to eat some rice yesterday evening which  she apparently tolerated. No formal sign out from night nurse to day nurse. Tube feeds currently at rate of 40 mL/hour. Abdominal pain and diarrhea are subjectively improved. She is getting daytime tube feeds which is likely negatively impacting her daytime appetite and further contributing to her lack of PO intake. -PO intake encouraged. Patient agreed to try further PO intake today. -Change tube feeds to night feeds in an effort to encourage appetite during the day -Continue PRN Imodium  -Reglan 10 mg TID WC -Crestor 10 mg daily for possible IMA pathology   -Continue Remeron 15 mg nightly -Patient has not been receiving Creon as it cannot be placed down Core Trak. I have asked staff to administer this medicine PO -Continue Protonix 40 mg BID, Carafate 1 g every 6 hours -Trend phosphorous, magnesium, and potassium to monitor for refeeding syndrome   #AKI on CKD4 Creatinine unchanged from 10/27, 3.53. Nephrology is following Alicia [name] but previously noted that this could be due to prolonged pre-renal state leading to tubular injury. They have recommended IV hydration and daily monitoring of renal function. At this point, no indication for hemodialysis. -Nephrology following, appreciate their assistance -Avoid nephrotoxic medications -IV hydration, electrolyte repletion as needed  Best Practice: Diet: Tube feeds, free water flushes, encourage PO intake IVF: Fluids: None VTE: enoxaparin (LOVENOX) injection 30 mg Start: 09/14/22 2045 Code: Full AB: None Therapy Recs: SNF, DME: wheelchair and other: hospital bed DISPO: Anticipated discharge pending Nutritional means.  Signature: Farrel Gordon, D.O.  Internal Medicine Resident, PGY-1 [facility]  Pager: [phone] 12:12 PM, 09/17/2022   Please contact the on call pager after 5 pm and on weekends at [phone].

## 2022-09-17 NOTE — Progress Notes (Signed)
Brief Nutrition Note:   Received page from MD requesting trial of nocturnal TF to see if this will improve pt's po intake. Unsure that this strategy will work as pt was eating 0% of meals prior to TF initiation due to poor appetite and pain but will trial for 48 hours or so and see what happens.   Plan to discontinue current TF now. Plan to start nocturnal TF tomorrow evening at 1800.   Interventions:  1) Calorie Count ordered to start tomorrow AM to assess po intake  2) Tube Feeding via Cortrak: Change to Osmolite 1.5 formula at rate of 60 ml/hr over 12 hours daily (1800 to 0600). This meets 60% calorie and 55% protein needs. Plan to increase infusion rate as able to better meet nutritional needs but unsure if pt will tolerate increased rate so will start here This provides 1080 kcals and 45 g of protein.    Kerman Passey MS, RDN, LDN, CNSC Registered Dietitian 3 Clinical Nutrition RD Pager and On-Call Pager Number Located in [place]

## 2022-09-18 LAB — BASIC METABOLIC PANEL
Anion gap: 11 (ref 5–15)
BUN: 57 mg/dL — ABNORMAL HIGH (ref 8–23)
CO2: 18 mmol/L — ABNORMAL LOW (ref 22–32)
Calcium: 7.7 mg/dL — ABNORMAL LOW (ref 8.9–10.3)
Chloride: 103 mmol/L (ref 98–111)
Creatinine, Ser: 3.42 mg/dL — ABNORMAL HIGH (ref 0.44–1.00)
GFR, Estimated: 14 mL/min — ABNORMAL LOW (ref >60)
Glucose, Bld: 103 mg/dL — ABNORMAL HIGH (ref 70–99)
Potassium: 4.5 mmol/L (ref 3.5–5.1)
Sodium: 132 mmol/L — ABNORMAL LOW (ref 135–145)

## 2022-09-18 LAB — GLUCOSE, CAPILLARY
Glucose-Capillary: 104 mg/dL — ABNORMAL HIGH (ref 70–99)
Glucose-Capillary: 112 mg/dL — ABNORMAL HIGH (ref 70–99)
Glucose-Capillary: 114 mg/dL — ABNORMAL HIGH (ref 70–99)
Glucose-Capillary: 71 mg/dL (ref 70–99)
Glucose-Capillary: 82 mg/dL (ref 70–99)
Glucose-Capillary: 99 mg/dL (ref 70–99)

## 2022-09-18 MED ORDER — DEXTROSE 50 % IV SOLN
25.0000 mL | INTRAVENOUS | Status: DC | PRN
  Administered 2022-09-21 – 2022-09-22 (×4): 25 mL via INTRAVENOUS
  Filled 2022-09-18 (×5): qty 50

## 2022-09-18 MED ORDER — DEXTROSE 50 % IV SOLN: 25.0000 mL | INTRAVENOUS | Status: DC | PRN

## 2022-09-18 NOTE — Plan of Care (Signed)
  Problem: Nutritional: Goal: Maintenance of adequate nutrition will improve Outcome: Not Progressing Goal: Progress toward achieving an optimal weight will improve Outcome: Not Progressing   Problem: Nutrition Goal: Patient maintains weight Outcome: Not Progressing Goal: Patient/Family independently completes tube feeding Outcome: Not Progressing

## 2022-09-18 NOTE — Progress Notes (Signed)
Subjective:   Summary: Alicia [name] is a 71 y.o. year old female currently admitted on the IMTS HD#25 for diffuse abdominal pain as well as severely decreased PO intake likely multifactorial in etiology due to pancreatic insufficiency, chronic IMA occlusion, duodenal ulcers.  Overnight Events:   -Tube feeds changed to nighttime to try to stimulate daytime appetite.  Rate increased to 60 mL/h to accommodate for shorter feeds. -Ate a couple spoons of applesauce yesterday with her Creon but not much else.  Is having some issues with dentures, will try to resolve. - Still not having much of an appetite at all.  He is reluctant to try to eat more.  Try to motivate her to continue increasing p.o. intake this morning.  Set a goal of feeding at least half a cup of pudding or other soft food with each meal as well as some water.  Afterwards, with me and nursing staff in the room, she ate approximately 3-4 spoonfuls of chocolate pudding as well as drink some water. - She states that she will let us know if she is having worsening symptoms, but they are well managed at this point. - Appears profoundly depressed, without much motivation. Objective:  Vital signs in last 24 hours: Vitals:   09/17/22 0912 09/17/22 1541 09/17/22 2036 09/18/22 0417  BP: (!) 148/80 118/65 (!) 143/75 (!) 140/74  Pulse: 82 82 85 84  Resp: '18 18 16 16  '$ Temp: 98.1 F (36.7 C) 97.8 F (36.6 C) 98.4 F (36.9 C) 98.4 F (36.9 C)  TempSrc:      SpO2: 100% 98% 100% 100%  Weight:      Height:       Supplemental O2: Room Air    Physical Exam:  Constitutional: NAD HEENT: Feeding tube in place Cardiovascular: RRR, no murmurs, rubs, or gallops Abdominal: minimal diffuse TTP, no guarding or rebound.  Distended but soft.  Periumbilical hernia unchanged.  Extremities: no lower extremity edema present. Neuro/Psych: Flat affect, interacts and answers questions but is minimally  verbal.   Intake/Output Summary (Last 24 hours) at 09/18/2022 0704 Last data filed at 09/18/2022 3419 Gross per 24 hour  Intake 0 ml  Output 700 ml  Net -700 ml    Net IO Since Admission: 24,891.3 mL [09/18/22 0704]  Pertinent Labs: Unable to obtain labs today after multiple attempts    Latest Ref Rng & Units 09/17/2022   10:57 AM 09/16/2022   10:46 AM 09/14/2022    8:09 AM  CBC  WBC 4.0 - 10.5 K/uL 9.4  9.1  10.1   Hemoglobin 12.0 - 15.0 g/dL 10.3  10.4  10.6   Hematocrit 36.0 - 46.0 % 29.2  30.0  30.9   Platelets 150 - 400 K/uL 259  269  260        Latest Ref Rng & Units 09/18/2022    3:38 AM 09/17/2022   10:57 AM 09/16/2022   10:46 AM  CMP  Glucose 70 - 99 mg/dL 103  137  90   BUN 8 - 23 mg/dL 57  56  55   Creatinine 0.44 - 1.00 mg/dL 3.42  3.53  3.53   Sodium 135 - 145 mmol/L 132  129  134   Potassium 3.5 - 5.1 mmol/L 4.5  6.0  4.5   Chloride 98 - 111 mmol/L 103  99  104   CO2 22 - 32 mmol/L  $'18  19  17   'T$ Calcium 8.9 - 10.3 mg/dL 7.7  7.6  7.7   Total Protein 6.5 - 8.1 g/dL  4.4  4.6   Total Bilirubin 0.3 - 1.2 mg/dL  0.8  0.6   Alkaline Phos 38 - 126 U/L  548  515   AST 15 - 41 U/L  71  54   ALT 0 - 44 U/L  103  112    Last 3 CBGs 99, 114, 163   Assessment/Plan:   Patient Summary: Alicia [name] is a 71 y.o. with a pertinent PMH of HFpEF, HTN, DMII, CKD4, PAD, prior CVA, who presented with progressive weakness and post-prandial abdominal pain and admitted for decreased functional ability, diffuse abdominal pain, and chronic diarrhea likely multifactorial in etiology due to pancreatic insufficiency, duodenal ulcers.   #Abdominal Pain of Unkown Etiology #Chronic diarrhea #IMA occlusion #Duodenal ulcers #Pancreatic insufficiency #Lack of PO Intake  #Possible food aversion Continuing medical management for her multifactorial abdominal symptoms.  Tube feeds changed to nighttime to try to stimulate daytime appetite.  Symptoms are well managed at this  point and exam is stable and benign, as are labs.  P.o. intake is slightly better if staff constantly encouraged her and especially if they are in the room while she is eating, but she does not seem to have much internal motivations for resuming p.o. intake.  If PO intake does not improve over next few days, will have to involve palliative care and consider PEG tube. Plan:  - Increase p.o. intake today - PRN Imodium for diarrhea. - Nighttime tube feeds trialed at 60 mL/h - Crestor 10 for possibile IMA pathology.  -schedule reglan '10mg'$  TID WC  - Continue Remeron at 15 mg nightly.  Can switch to Zyprexa if needed down the line, but need to allow for sufficient time to trial remeron. - Continue Creon for pancreatic insufficiency.  Needs to be taken p.o., cannot be administered through tube. - Continue Protonix 40 mg twice daily, Carafate 1 g every 6 hours -Low concern for refeeding syndrome at this point  #AKI on CKD4 Creatinine 3.42 today, improving from prior.  Nephrology signed off, no need to reconsult at this point as creatinine is downtrending with tube feeds and fluids.  Hopefully she can increase p.o. intake and sustain herself with p.o. fluids. - Appreciate nephrology for assistance. - Avoid nephrotoxic medications - IV hydration, electrolyte repletion as needed  Code: Full Dispo: Pending symptomatic improvement and increased PO intake vs. PEG placement.  Linus Galas MD PGY-1 Internal Medicine Resident Pager: [phone] Please contact the on call pager after 5 pm and on weekends at [phone].

## 2022-09-19 DIAGNOSIS — R1084 Generalized abdominal pain: Secondary | ICD-10-CM | POA: Diagnosis not present

## 2022-09-19 DIAGNOSIS — K269 Duodenal ulcer, unspecified as acute or chronic, without hemorrhage or perforation: Secondary | ICD-10-CM | POA: Diagnosis not present

## 2022-09-19 DIAGNOSIS — K529 Noninfective gastroenteritis and colitis, unspecified: Secondary | ICD-10-CM | POA: Diagnosis not present

## 2022-09-19 DIAGNOSIS — G8929 Other chronic pain: Secondary | ICD-10-CM | POA: Diagnosis not present

## 2022-09-19 LAB — HEPATIC FUNCTION PANEL
ALT: 89 U/L — ABNORMAL HIGH (ref 0–44)
AST: 53 U/L — ABNORMAL HIGH (ref 15–41)
Albumin: 1.9 g/dL — ABNORMAL LOW (ref 3.5–5.0)
Alkaline Phosphatase: 540 U/L — ABNORMAL HIGH (ref 38–126)
Bilirubin, Direct: 0.2 mg/dL (ref 0.0–0.2)
Indirect Bilirubin: 0.2 mg/dL — ABNORMAL LOW (ref 0.3–0.9)
Total Bilirubin: 0.4 mg/dL (ref 0.3–1.2)
Total Protein: 5.2 g/dL — ABNORMAL LOW (ref 6.5–8.1)

## 2022-09-19 LAB — GLUCOSE, CAPILLARY
Glucose-Capillary: 115 mg/dL — ABNORMAL HIGH (ref 70–99)
Glucose-Capillary: 126 mg/dL — ABNORMAL HIGH (ref 70–99)
Glucose-Capillary: 129 mg/dL — ABNORMAL HIGH (ref 70–99)
Glucose-Capillary: 149 mg/dL — ABNORMAL HIGH (ref 70–99)
Glucose-Capillary: 153 mg/dL — ABNORMAL HIGH (ref 70–99)
Glucose-Capillary: 170 mg/dL — ABNORMAL HIGH (ref 70–99)
Glucose-Capillary: 170 mg/dL — ABNORMAL HIGH (ref 70–99)
Glucose-Capillary: 231 mg/dL — ABNORMAL HIGH (ref 70–99)

## 2022-09-19 LAB — CBC
HCT: 34.1 % — ABNORMAL LOW (ref 36.0–46.0)
Hemoglobin: 11.4 g/dL — ABNORMAL LOW (ref 12.0–15.0)
MCH: 32 pg (ref 26.0–34.0)
MCHC: 33.4 g/dL (ref 30.0–36.0)
MCV: 95.8 fL (ref 80.0–100.0)
Platelets: 330 10*3/uL (ref 150–400)
RBC: 3.56 MIL/uL — ABNORMAL LOW (ref 3.87–5.11)
RDW: 18.3 % — ABNORMAL HIGH (ref 11.5–15.5)
WBC: 9.1 10*3/uL (ref 4.0–10.5)
nRBC: 0 % (ref 0.0–0.2)

## 2022-09-19 LAB — BASIC METABOLIC PANEL
Anion gap: 16 — ABNORMAL HIGH (ref 5–15)
BUN: 61 mg/dL — ABNORMAL HIGH (ref 8–23)
CO2: 18 mmol/L — ABNORMAL LOW (ref 22–32)
Calcium: 8.1 mg/dL — ABNORMAL LOW (ref 8.9–10.3)
Chloride: 99 mmol/L (ref 98–111)
Creatinine, Ser: 3.63 mg/dL — ABNORMAL HIGH (ref 0.44–1.00)
GFR, Estimated: 13 mL/min — ABNORMAL LOW (ref >60)
Glucose, Bld: 215 mg/dL — ABNORMAL HIGH (ref 70–99)
Potassium: 4.4 mmol/L (ref 3.5–5.1)
Sodium: 133 mmol/L — ABNORMAL LOW (ref 135–145)

## 2022-09-19 LAB — LIPID PANEL
Cholesterol: 80 mg/dL (ref 0–200)
HDL: 40 mg/dL — ABNORMAL LOW (ref >40)
LDL Cholesterol: 23 mg/dL (ref 0–99)
Total CHOL/HDL Ratio: 2 RATIO
Triglycerides: 84 mg/dL (ref <150)
VLDL: 17 mg/dL (ref 0–40)

## 2022-09-19 LAB — HEMOGLOBIN A1C
Hgb A1c MFr Bld: 5.3 % (ref 4.8–5.6)
Mean Plasma Glucose: 105.41 mg/dL

## 2022-09-19 MED ORDER — ENSURE ENLIVE PO LIQD
237.0000 mL | Freq: Three times a day (TID) | ORAL | Status: DC
  Administered 2022-09-19 – 2022-09-25 (×5): 237 mL via ORAL

## 2022-09-19 MED ORDER — OLANZAPINE 5 MG PO TBDP
2.5000 mg | ORAL_TABLET | Freq: Every day | ORAL | Status: DC
  Administered 2022-09-20 – 2022-09-26 (×5): 2.5 mg via ORAL
  Filled 2022-09-19 (×10): qty 0.5

## 2022-09-19 NOTE — Progress Notes (Addendum)
Calorie Count Brief Note  Calorie count ordered until discontinued. Calorie count was scheduled to start on 10/29 at breakfast meal. However, no meal slips or documentation available at time of RD visit. Spoke with RN via phone call who confirmed that calorie count had not been started. Spoke with pt at bedside who reports consuming some chicken over the weekend.  RD has hung calorie count envelope on pt's door. RN/NT to document percent consumed for each item on the pt's meal tray ticket and keep in envelope. RN/NT to also document percent of any supplement or snack pt consumes and keep documentation in envelope for RD to review. Discussed with both RN and NT.  Will add oral nutrition supplement to maximize kcal and protein intake via PO route. Pt prefers chocolate flavor. - Ensure Enlive po TID, each supplement provides 350 kcal and 20 grams of protein  RD to follow up tomorrow, 09/20/22, for day 1 results. Continue nocturnal tube feeds as ordered.    Gustavus Bryant, MS, RD, LDN Inpatient Clinical Dietitian Please see AMiON for contact information.

## 2022-09-19 NOTE — Progress Notes (Addendum)
Subjective:   Summary: Alicia [name] is a 71 y.o. year old female currently admitted on the [facility] HD#26 for diffuse abdominal pain as well as severely decreased PO intake likely multifactorial in etiology due to pancreatic insufficiency, chronic IMA occlusion, duodenal ulcers.  Overnight Events:   -Ate more yesterday, but still not nearly enough to sustain herself through p.o. intake. - Abdominal pain is still well managed, she did have some small-volume spit up this morning after cessation of tube feeds, but she states she is alright now. -NAEON  Objective:  Vital signs in last 24 hours: Vitals:   09/18/22 1648 09/18/22 2032 09/19/22 0435 09/19/22 0949  BP: (!) 158/80 (!) 143/75 132/60 138/68  Pulse: (!) 47 85 80 68  Resp: 19     Temp: 98.6 F (37 C) 97.8 F (36.6 C) 98.1 F (36.7 C) 98.1 F (36.7 C)  TempSrc:  Oral Oral Oral  SpO2: 96%  100% (!) 66%  Weight:      Height:       Supplemental O2: Room Air    Physical Exam:  Constitutional: NAD HEENT: Feeding tube in place Abdominal: minimal diffuse TTP, no guarding or rebound.  Distended but soft.  Periumbilical hernia unchanged.  Extremities: no lower extremity edema present. Neuro/Psych: Affect slightly improved today when discussing her progress, interacts and answers questions   Intake/Output Summary (Last 24 hours) at 09/19/2022 1002 Last data filed at 09/19/2022 0900 Gross per 24 hour  Intake 942 ml  Output --  Net 942 ml   Net IO Since Admission: 25,833.3 mL [09/19/22 1002]  Pertinent Labs: Unable to obtain labs today after multiple attempts    Latest Ref Rng & Units 09/17/2022   10:57 AM 09/16/2022   10:46 AM 09/14/2022    8:09 AM  CBC  WBC 4.0 - 10.5 K/uL 9.4  9.1  10.1   Hemoglobin 12.0 - 15.0 g/dL 10.3  10.4  10.6   Hematocrit 36.0 - 46.0 % 29.2  30.0  30.9   Platelets 150 - 400 K/uL 259  269  260        Latest Ref Rng & Units 09/19/2022    7:38 AM 09/18/2022     3:38 AM 09/17/2022   10:57 AM  CMP  Glucose 70 - 99 mg/dL 215  103  137   BUN 8 - 23 mg/dL 61  57  56   Creatinine 0.44 - 1.00 mg/dL 3.63  3.42  3.53   Sodium 135 - 145 mmol/L 133  132  129   Potassium 3.5 - 5.1 mmol/L 4.4  4.5  6.0   Chloride 98 - 111 mmol/L 99  103  99   CO2 22 - 32 mmol/L '18  18  19   '$ Calcium 8.9 - 10.3 mg/dL 8.1  7.7  7.6   Total Protein 6.5 - 8.1 g/dL   4.4   Total Bilirubin 0.3 - 1.2 mg/dL   0.8   Alkaline Phos 38 - 126 U/L   548   AST 15 - 41 U/L   71   ALT 0 - 44 U/L   103    Last 3 CBGs 231, 170, 153   Assessment/Plan:   Patient Summary: Alicia [name] is a 71 y.o. with a pertinent PMH of HFpEF, HTN, DMII, CKD4, PAD, prior CVA, who presented with progressive weakness and post-prandial abdominal pain and admitted for decreased  functional ability, diffuse abdominal pain, and chronic diarrhea likely multifactorial in etiology due to pancreatic insufficiency, duodenal ulcers.   #Abdominal Pain of Unkown Etiology #Chronic diarrhea #IMA occlusion #Duodenal ulcers #Pancreatic insufficiency #Lack of PO Intake  #Possible food aversion Continuing medical management, symptoms are well controlled.  P.o. intake was significantly better yesterday, but not nearly enough to sustain herself without tube feeds.  We will change up regimen from mirtazapine to Zyprexa today, and collect necessary prerequisite labs.  Might need to consider ECT for refractory depression with poor p.o. intake down the line.  If necessary, will reconsult psych for this.  We will also involve palliative as the patient and daughter were requesting that her daughter is assigned as healthcare POA and for symptomatic treatment.  I think it would be inappropriate to change her CODE STATUS or goals of care at this time given that her profound depression. Plan:  - Increase p.o. intake today - Consult palliative care for assistance with healthcare power of attorney assignment and symptomatic  treatment.  ECT might be a possibility down the line if her depression and p.o. intake do not respond to current medical management. - Need to motivate her to work with PT/OT and increase frequency/intensity of attempts if possible given the prolonged nature of her hospital stay and severe deconditioning. - PRN Imodium for diarrhea. - Continue nighttime tube feeds at 60 mL/h - Crestor 10 for possibile IMA pathology.  -schedule reglan '10mg'$  TID WC  -We will DC Remeron and start Zyprexa today.  We will collect LFTs, CBC, lipid panel, A1c.  EKG at the beginning of this admission showed QTc of 399. - Continue Creon for pancreatic insufficiency.  Needs to be taken p.o., cannot be administered through tube. - Continue Protonix 40 mg twice daily, Carafate 1 g every 6 hours -Low concern for refeeding syndrome at this point  #AKI on CKD4 Creatinine 3.63 today, improving from prior.  Nephrology signed off, no need to reconsult at this point as creatinine is downtrending with tube feeds and fluids.  Hopefully she can increase p.o. intake and sustain herself with p.o. fluids. - Appreciate nephrology for assistance. - Avoid nephrotoxic medications - IV hydration, electrolyte repletion as needed  Code: Full Dispo: Pending symptomatic improvement and increased PO intake vs. PEG placement.  Linus Galas MD PGY-1 Internal Medicine Resident Pager: [phone] Please contact the on call pager after 5 pm and on weekends at [phone].

## 2022-09-20 ENCOUNTER — Inpatient Hospital Stay (HOSPITAL_COMMUNITY): Payer: Medicare HMO

## 2022-09-20 DIAGNOSIS — N179 Acute kidney failure, unspecified: Secondary | ICD-10-CM | POA: Diagnosis not present

## 2022-09-20 DIAGNOSIS — N184 Chronic kidney disease, stage 4 (severe): Secondary | ICD-10-CM

## 2022-09-20 DIAGNOSIS — G8929 Other chronic pain: Secondary | ICD-10-CM | POA: Diagnosis not present

## 2022-09-20 DIAGNOSIS — Z515 Encounter for palliative care: Secondary | ICD-10-CM

## 2022-09-20 DIAGNOSIS — R1084 Generalized abdominal pain: Secondary | ICD-10-CM | POA: Diagnosis not present

## 2022-09-20 DIAGNOSIS — R531 Weakness: Secondary | ICD-10-CM | POA: Diagnosis not present

## 2022-09-20 DIAGNOSIS — D649 Anemia, unspecified: Secondary | ICD-10-CM | POA: Diagnosis not present

## 2022-09-20 DIAGNOSIS — N390 Urinary tract infection, site not specified: Secondary | ICD-10-CM | POA: Diagnosis not present

## 2022-09-20 DIAGNOSIS — I700 Atherosclerosis of aorta: Secondary | ICD-10-CM | POA: Diagnosis not present

## 2022-09-20 DIAGNOSIS — I130 Hypertensive heart and chronic kidney disease with heart failure and stage 1 through stage 4 chronic kidney disease, or unspecified chronic kidney disease: Secondary | ICD-10-CM | POA: Diagnosis not present

## 2022-09-20 DIAGNOSIS — E876 Hypokalemia: Secondary | ICD-10-CM | POA: Diagnosis not present

## 2022-09-20 DIAGNOSIS — I509 Heart failure, unspecified: Secondary | ICD-10-CM | POA: Diagnosis not present

## 2022-09-20 DIAGNOSIS — R109 Unspecified abdominal pain: Secondary | ICD-10-CM | POA: Diagnosis not present

## 2022-09-20 DIAGNOSIS — E1110 Type 2 diabetes mellitus with ketoacidosis without coma: Secondary | ICD-10-CM | POA: Diagnosis not present

## 2022-09-20 DIAGNOSIS — K269 Duodenal ulcer, unspecified as acute or chronic, without hemorrhage or perforation: Secondary | ICD-10-CM | POA: Diagnosis not present

## 2022-09-20 DIAGNOSIS — R638 Other symptoms and signs concerning food and fluid intake: Secondary | ICD-10-CM

## 2022-09-20 LAB — BASIC METABOLIC PANEL
Anion gap: 15 (ref 5–15)
BUN: 65 mg/dL — ABNORMAL HIGH (ref 8–23)
CO2: 17 mmol/L — ABNORMAL LOW (ref 22–32)
Calcium: 7.8 mg/dL — ABNORMAL LOW (ref 8.9–10.3)
Chloride: 98 mmol/L (ref 98–111)
Creatinine, Ser: 3.82 mg/dL — ABNORMAL HIGH (ref 0.44–1.00)
GFR, Estimated: 12 mL/min — ABNORMAL LOW (ref >60)
Glucose, Bld: 217 mg/dL — ABNORMAL HIGH (ref 70–99)
Potassium: 5.5 mmol/L — ABNORMAL HIGH (ref 3.5–5.1)
Sodium: 130 mmol/L — ABNORMAL LOW (ref 135–145)

## 2022-09-20 LAB — GLUCOSE, CAPILLARY
Glucose-Capillary: 103 mg/dL — ABNORMAL HIGH (ref 70–99)
Glucose-Capillary: 114 mg/dL — ABNORMAL HIGH (ref 70–99)
Glucose-Capillary: 160 mg/dL — ABNORMAL HIGH (ref 70–99)
Glucose-Capillary: 196 mg/dL — ABNORMAL HIGH (ref 70–99)
Glucose-Capillary: 215 mg/dL — ABNORMAL HIGH (ref 70–99)
Glucose-Capillary: 78 mg/dL (ref 70–99)

## 2022-09-20 LAB — GAMMA GT: GGT: 368 U/L — ABNORMAL HIGH (ref 7–50)

## 2022-09-20 LAB — LIPASE, BLOOD: Lipase: 48 U/L (ref 11–51)

## 2022-09-20 LAB — PHOSPHORUS: Phosphorus: 3.6 mg/dL (ref 2.5–4.6)

## 2022-09-20 MED ORDER — SODIUM BICARBONATE 650 MG PO TABS
650.0000 mg | ORAL_TABLET | Freq: Two times a day (BID) | ORAL | Status: DC
  Administered 2022-09-20: 650 mg
  Filled 2022-09-20: qty 1

## 2022-09-20 MED ORDER — PANTOPRAZOLE SODIUM 40 MG IV SOLR
40.0000 mg | Freq: Two times a day (BID) | INTRAVENOUS | Status: DC
  Administered 2022-09-21: 40 mg via INTRAVENOUS
  Filled 2022-09-20: qty 10

## 2022-09-20 MED ORDER — LACTATED RINGERS IV SOLN: INTRAVENOUS | Status: DC

## 2022-09-20 MED ORDER — SODIUM ZIRCONIUM CYCLOSILICATE 10 G PO PACK
10.0000 g | PACK | Freq: Every day | ORAL | Status: DC
  Administered 2022-09-20 – 2022-09-21 (×2): 10 g via ORAL
  Filled 2022-09-20 (×2): qty 1

## 2022-09-20 MED ORDER — SODIUM CHLORIDE 0.9 % IV SOLN: INTRAVENOUS | Status: DC

## 2022-09-20 MED ORDER — OSMOLITE 1.5 CAL PO LIQD
1000.0000 mL | ORAL | Status: DC
  Filled 2022-09-20: qty 1185

## 2022-09-20 MED ORDER — IOHEXOL 9 MG/ML PO SOLN
500.0000 mL | ORAL | Status: AC
  Administered 2022-09-20 (×2): 500 mL via ORAL

## 2022-09-20 NOTE — Progress Notes (Signed)
Physical Therapy Treatment Patient Details Name: Alicia [name] MRN: [id number] DOB: 09/01/1951 Today's Date: 09/20/2022   History of Present Illness 71 y.o. female presented to ED 10/4 with FTT. Cortrak placed 10/18 PMhx: CHf, HTN, CVA, T2DM, CKD, vit D deficiency, PAD, depression, HFpEF    PT Comments    Continuing work on functional mobility and activity tolerance;  Session focused on initial transfer OOB to the recliner, and used Maximove lift as a reliable method for the first time up; Tolerated well, and pt seemed pleased to be up; Participated in LE therex in chair and positioned for symmetry and support; REc OOB daily with Maximove, and PT to continue work on functional mobility   Recommendations for follow up therapy are one component of a multi-disciplinary discharge planning process, led by the attending physician.  Recommendations may be updated based on patient status, additional functional criteria and insurance authorization.  Follow Up Recommendations  Skilled nursing-short term rehab (<3 hours/day) Can patient physically be transported by private vehicle: No   Assistance Recommended at Discharge Frequent or constant Supervision/Assistance  Patient can return home with the following Assistance with cooking/housework;Direct supervision/assist for medications management;Assist for transportation;A lot of help with bathing/dressing/bathroom;Two people to help with walking and/or transfers;Help with stairs or ramp for entrance   Equipment Recommendations  Wheelchair (measurements PT);Wheelchair cushion (measurements PT);Hospital bed (hoyer lift)    Recommendations for Other Services Other (comment) (Palliative Consult)     Precautions / Restrictions Precautions Precautions: Fall Precaution Comments: Skin integrity, incontinent of bowel     Mobility  Bed Mobility Overal bed mobility: Needs Assistance Bed Mobility: Rolling Rolling: Min assist         General  bed mobility comments: rolled to place lift pad    Transfers Overall transfer level: Needs assistance Equipment used: Ambulation equipment used Transfers: Bed to chair/wheelchair/BSC             General transfer comment: Maximove transfer bed to recliner, tolerated well Transfer via Lift Equipment: Maximove  Ambulation/Gait                   Stairs             Wheelchair Mobility    Modified Rankin (Stroke Patients Only)       Balance     Sitting balance-Leahy Scale: Fair Sitting balance - Comments: pulled from supported sitting to unsupported sitting in recliner                                    Cognition Arousal/Alertness: Awake/alert Behavior During Therapy: WFL for tasks assessed/performed, Flat affect Overall Cognitive Status: Within Functional Limits for tasks assessed                                 General Comments: Much more awake and talkative; wanting to get OOB        Exercises General Exercises - Lower Extremity Short Arc Quad: AROM, Both, 5 reps Hip Flexion/Marching: AAROM, Both, 5 reps    General Comments General comments (skin integrity, edema, etc.): Able to extend both knees to full extension against gravity      Pertinent Vitals/Pain Pain Assessment Pain Assessment: Faces Faces Pain Scale: Hurts a little bit Pain Location: buttock Pain Descriptors / Indicators: Grimacing Pain Intervention(s): Monitored during session, Repositioned    Home Living  Prior Function            PT Goals (current goals can now be found in the care plan section) Acute Rehab PT Goals Patient Stated Goal: Wants to get OOB PT Goal Formulation: With patient Time For Goal Achievement: 09/29/22 Potential to Achieve Goals: Good Progress towards PT goals: Progressing toward goals    Frequency    Min 2X/week      PT Plan Current plan remains appropriate     Co-evaluation              AM-PAC PT "6 Clicks" Mobility   Outcome Measure  Help needed turning from your back to your side while in a flat bed without using bedrails?: A Little Help needed moving from lying on your back to sitting on the side of a flat bed without using bedrails?: A Lot Help needed moving to and from a bed to a chair (including a wheelchair)?: Total Help needed standing up from a chair using your arms (e.g., wheelchair or bedside chair)?: Total Help needed to walk in hospital room?: Total Help needed climbing 3-5 steps with a railing? : Total 6 Click Score: 9    End of Session Equipment Utilized During Treatment:  (Maximove) Activity Tolerance: Patient tolerated treatment well Patient left: in chair;with call bell/phone within reach;with chair alarm set Nurse Communication: Mobility status;Need for lift equipment PT Visit Diagnosis: Other abnormalities of gait and mobility (R26.89);History of falling (Z91.81);Muscle weakness (generalized) (M62.81)     Time: 2426-8341 PT Time Calculation (min) (ACUTE ONLY): 39 min  Charges:  $Therapeutic Activity: 38-52 mins                     Roney Marion, PT  Acute Rehabilitation Services Office [phone]    Alicia [name] 09/20/2022, 3:17 PM

## 2022-09-20 NOTE — H&P (View-Only) (Signed)
Daily Progress Note  Hospital Day: 28  Chief Complaint:  abdominal pain, loose stool  Brief History Patient is a 71 yo female with a pmh of H.pylori,gastritis, PUD, chronic nausea, chronic abdominal pain, colon polyps, HTN, HLD, obesity, PAD, CVA, CKD4, heart failure, headaches. Seen in consultation by [place] on  10/11 for evaluation of post-prandial abdominal pain , N/V.    Assessment / Plan   # 71 yo female with chronic nausea /chronic abdominal pain. Now with prolonged admission for symptoms in addition to loose stool.  We saw her earlier this admission. Findings to date include PUD,  possible pancreatic insufficiency and now new elevation in LFTs  Etiology of symptoms have not been clear and no lasting improvement with any medications or other interventions So far:  Unrevealing non-contrast CT scan, mesenteric [place], MRA angio abdomen ( limited study due to artifact but celiac axis and SMA without significant obstructive disease) RUQ [place],  cortisol level, celiac serologies, stool studies ( to check for infection) Lipase was mildly elevated at 70 ( less than 2xULN).  EGD on 10/12 showing non-bleeding duodenal ulcers and duodenitis. Biopsies negative for H. Pylori. Treated with PPI and carafate.   Initially when we saw her in consult the diarrhea had resolved. Actually had a rectal stool burden on imaging. We treated her for constipation. At some point loose stool returned.  Fecal elastase did return low at 50 . Started on Creon but taking only sporadically Taking Reglan for possible gastroparesis  Taking dicyclomine for abdominal pain  Psychiatry has evaluated, doesn't meet inpatient psych criteria. Recommended to continue Zyprexa, cymbalta and consider periactin for appetite stimulant. Plan: Repeat lipase. Obtain Alk phos isoenzymes, GGT Repeat CT scan today with oral contrast. EGD tomorrow to reassess ulcers / duodenitis.  The risks and benefits of EGD with possible biopsies were discussed  with the patient who agrees to proceed.  Repeat stool studies ( GI path panel and C-dff) Continue PPI, will change to IV as hospital no longer able to get the suspension.  After EGD tomorrow, will hopefully be able stop Carafate since she has CKD  Query if some of her symptoms and elevated LFTs could be related to congestive hepatopathy. If I+0 correct she is + 15 Liter fluid balance, she has BLE pitting edema and substantial weight gain. Has hypoalbuminemia.   Nephrology following   Subjective   Continues to have generalized upper abdominal pain when trying to eat. She also has generalized lower abdominal pain. Having nausea if she tries to eat. She is having several loose BMs a day.   Objective   Imaging:  No results found.  Lab Results: Recent Labs    09/19/22 0738  WBC 9.1  HGB 11.4*  HCT 34.1*  PLT 330   BMET Recent Labs    09/18/22 0338 09/19/22 0738 09/20/22 0443  NA 132* 133* 130*  K 4.5 4.4 5.5*  CL 103 99 98  CO2 18* 18* 17*  GLUCOSE 103* 215* 217*  BUN 57* 61* 65*  CREATININE 3.42* 3.63* 3.82*  CALCIUM 7.7* 8.1* 7.8*   LFT Recent Labs    09/19/22 0738  PROT 5.2*  ALBUMIN 1.9*  AST 53*  ALT 89*  ALKPHOS 540*  BILITOT 0.4  BILIDIR 0.2  IBILI 0.2*   PT/INR No results for input(s): "LABPROT", "INR" in the last 72 hours.   Scheduled inpatient medications:   acetaminophen  650 mg Rectal Q6H   Or   acetaminophen (TYLENOL) oral liquid  160 mg/5 mL  1,000 mg Per Tube Q6H   amLODipine  10 mg Per Tube Daily   dicyclomine  10 mg Per Tube TID AC & HS   DULoxetine  30 mg Oral BID   enoxaparin (LOVENOX) injection  30 mg Subcutaneous Q24H   feeding supplement  237 mL Oral TID BM   feeding supplement (OSMOLITE 1.5 CAL)  1,000 mL Per Tube I32P   folic acid  1 mg Per Tube Daily   free water  200 mL Per Tube Q4H   insulin aspart  0-5 Units Subcutaneous Q4H   insulin glargine-yfgn  6 Units Subcutaneous QHS   lipase/protease/amylase  24,000 Units Oral TID    metoCLOPramide  10 mg Per Tube TID AC   metoprolol tartrate  50 mg Per Tube BID   multivitamin  1 tablet Per Tube QHS   OLANZapine zydis  2.5 mg Oral QHS   mouth rinse  15 mL Mouth Rinse 4 times per day   pantoprazole  40 mg Per Tube BID   rosuvastatin  10 mg Per Tube QPM   sodium bicarbonate  650 mg Oral BID   sodium zirconium cyclosilicate  10 g Oral Daily   sucralfate  1 g Per Tube Q6H   thiamine  100 mg Per Tube Daily   Continuous inpatient infusions:   lactated ringers 75 mL/hr at 09/20/22 1251   PRN inpatient medications: dextrose, mouth rinse, phenol, phenylephrine-shark liver oil-mineral oil-petrolatum, Zinc Oxide  Vital signs in last 24 hours: Temp:  [97.3 F (36.3 C)-98.9 F (37.2 C)] 98.3 F (36.8 C) (10/31 0831) Pulse Rate:  [75-80] 80 (10/31 0831) Resp:  [16-18] 16 (10/31 0831) BP: (120-147)/(60-97) 120/73 (10/31 0831) SpO2:  [99 %-100 %] 99 % (10/31 0831) Weight:  [117.5 kg] 117.5 kg (10/31 0441) Last BM Date : 09/20/22  Intake/Output Summary (Last 24 hours) at 09/20/2022 1518 Last data filed at 09/20/2022 1445 Gross per 24 hour  Intake 130.22 ml  Output 500 ml  Net -369.78 ml    Intake/Output from previous day: 10/30 0701 - 10/31 0700 In: 0  Out: 300 [Urine:300] Intake/Output this shift: Total I/O In: 130.2 [I.V.:130.2] Out: 200 [Emesis/NG output:200]   Physical Exam:  General: Alert female in NAD.  Heart:  Regular rate and rhythm. BLE pitting edema Pulmonary: Normal respiratory effort Abdomen: Soft, obese, NT ,normal bowel sounds.  Neurologic: Alert and oriented Psych: Pleasant. Cooperative.    Principal Problem:   Chronic generalized abdominal pain Active Problems:   Acute worsening of stage 4 chronic kidney disease (HCC)   Generalized weakness   Pressure injury of skin   Nausea and vomiting   Diarrhea   Duodenal ulcer   Mucosal abnormality of stomach   Mucosal abnormality of esophagus   Food aversion   Hypoalbuminemia due to  protein-calorie malnutrition ([place])     LOS: 21 days   Alicia [name] ,NP 09/20/2022, 3:18 PM    Attending physician's note   I have taken history, reviewed the chart and examined the patient. I performed a substantive portion of this encounter, including complete performance of at least one of the key components, in conjunction with the APP. I agree with the Advanced Practitioner's note, impression and recommendations.   Discussed in detail with Nevin Bloodgood.  This is a reconsult.  Prolonged adm.  Chronic abdominal pain with chronic nausea, decreased appetite and failure to thrive on nocturnal core track feeding. New onset abn LFTs- ?etology. ?Congestive hepatomegaly Diarrhea- ?Related to tube feeds. R/O  other causes. Neg colon 06/2019 except for div, small TA. Multiple comorbidities as above eluding hypoalbuminemia  Plan: -IV Protonix -CT Abdo/pelvis with oral contrast.  No IV contrast d/t CKD -EGD in AM to reassess ulcers. -Recheck lipase, GGT, alkaline phos isoenzymes. -Would also repeat stool studies. -She has significant wt gain with +15lit fluid balance since adm. Will discuss with renal in AM.   Carmell Austria, MD [facility] [phone]

## 2022-09-20 NOTE — Consult Note (Signed)
Consultation Note Date: 09/20/2022   Patient Name: Alicia [name]  DOB: [birth year]  MRN: [id number]  Age / Sex: 71 y.o., female  PCP: Rick Duff, MD Referring Physician: Axel Filler, *  Reason for Consultation: Establishing goals of care and Psychosocial/spiritual support  HPI/Patient Profile: 71 y.o. female  admitted on 08/24/2022 with  complex past medical history;  H.pylori gastritis (treated in 2020, follow up eradication testing negative), chronic abdominal pain,  chronic nausea,  colon polyps, pancreatic insufficiency, chronic IMA occlusion, duodenal ulcers  Admitted with weakness, post-prandial abdominal pain, N/V, loose stools.  Day 26 of this hospital stay complicated by poor po intake and depressed mood, psychiatry consulted.  Patient faces treatment option decisions, advanced directive decisions and anticipatory care needs.  Clinical Assessment and Goals of Care:  This NP Wadie Lessen reviewed medical records, received report from team, assessed the patient and then meet at the patient's bedside  to discuss diagnosis, prognosis, GOC,  disposition and options.   Concept of Palliative Care was introduced as specialized medical care for people and their families living with serious illness.  If focuses on providing relief from the symptoms and stress of a serious illness.  The goal is to improve quality of life for both the patient and the family.  Created space and opportunity for patient to explore thoughts and feelings regarding current medical situation.  She expresses to me an understanding of her complicated medical situation.  Exploring her coping strategies she tells me she "takes it one day at a time".   She expressed a sense of feeling safe in the hospital.  Patient tells me she lives alone and all her family live out of state.  She has very little social support.    She has a daughter who lives in [place], and sadly her other daughter died a year ago from complex medical issues and COVID.  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  She is originally from [place]     With patient's permission spoke to daughter/ [employer] by phone and discussed with her the above concepts and concerns. I expressed concerns regarding her increasing nursing care needs and next steps in transition of care.  This is the patient's 4 admission in six months and day 27 of this hospital stay.     A  discussion was had today regarding advanced directives and the importance of documentation of HPOA and AD.    Recommendation for family meeting; both patient and daughter agree,  phone meeting scheduled for tomorrow at 11:30  Questions and concerns addressed.  Patient/family member  encouraged to call with questions or concerns.     PMT will continue to support holistically.          No documented H POA or advanced care planning documents noted.  Discussed opportunity to secure those documents while she is here in the hospital.  Patient will think about it.   Her only daughter is next of kin      SUMMARY OF  RECOMMENDATIONS    Code Status/Advance Care Planning: Full code   Palliative Prophylaxis:  Bowel Regimen, Delirium Protocol, and Frequent Pain Assessment  Additional Recommendations (Limitations, Scope, Preferences): Full Scope Treatment  Psycho-social/Spiritual:  Desire for further Chaplaincy support:no-declined   Prognosis:  Unable to determine  Discharge Planning: To Be Determined      Primary Diagnoses: Present on Admission: **None**   I have reviewed the medical record, interviewed the patient and family, and examined the patient. The following aspects are pertinent.  Past Medical History:  Diagnosis Date   Allergy    Arthritis    Chronic headaches    Constipation    Depression    Diabetes mellitus    GERD (gastroesophageal  reflux disease)    Hyperlipidemia    Hypertension    Obesity    PAD (peripheral artery disease) (HCC)    stage 4    Stroke [facility])    Social History   Socioeconomic History   Marital status: Single    Spouse name: Not on file   Number of children: Not on file   Years of education: Not on file   Highest education level: Not on file  Occupational History   Not on file  Tobacco Use   Smoking status: Light Smoker    Packs/day: 0.50    Years: 35.00    Total pack years: 17.50    Types: Cigarettes   Smokeless tobacco: Never   Tobacco comments:    patient is trying to quit and has gone down to half a pack per day 1 per day.  Vaping Use   Vaping Use: Never used  Substance and Sexual Activity   Alcohol use: Yes    Alcohol/week: 1.0 standard drink of alcohol    Types: 1 Standard drinks or equivalent per week    Comment: occasional use only   Drug use: No   Sexual activity: Not Currently  Other Topics Concern   Not on file  Social History Narrative   Not on file   Social Determinants of Health   Financial Resource Strain: Not on file  Food Insecurity: No Food Insecurity (09/04/2022)   Hunger Vital Sign    Worried About Running Out of Food in the Last Year: Never true    Ran Out of Food in the Last Year: Never true  Transportation Needs: Unmet Transportation Needs (09/04/2022)   PRAPARE - Hydrologist (Medical): Yes    Lack of Transportation (Non-Medical): Yes  Physical Activity: Inactive (07/28/2021)   Exercise Vital Sign    Days of Exercise per Week: 0 days    Minutes of Exercise per Session: 0 min  Stress: Not on file  Social Connections: Not on file   Family History  Problem Relation Age of Onset   Diabetes Father    Heart disease Mother        NOT before age 46-  Bypass   Hypertension Mother    Hypertension Sister    Varicose Veins Brother    Heart disease Brother        Before age 37   Hypertension Daughter    Colon cancer Neg Hx     Esophageal cancer Neg Hx    Rectal cancer Neg Hx    Stomach cancer Neg Hx    Scheduled Meds:  acetaminophen  650 mg Rectal Q6H   Or   acetaminophen (TYLENOL) oral liquid 160 mg/5 mL  1,000 mg Per Tube Q6H   amLODipine  10 mg Per Tube Daily   dicyclomine  10 mg Per Tube TID AC & HS   DULoxetine  30 mg Oral BID   enoxaparin (LOVENOX) injection  30 mg Subcutaneous Q24H   feeding supplement  237 mL Oral TID BM   feeding supplement (OSMOLITE 1.5 CAL)  1,000 mL Per Tube F81W   folic acid  1 mg Per Tube Daily   free water  200 mL Per Tube Q4H   insulin aspart  0-5 Units Subcutaneous Q4H   insulin glargine-yfgn  6 Units Subcutaneous QHS   lipase/protease/amylase  24,000 Units Oral TID   metoCLOPramide  10 mg Per Tube TID AC   metoprolol tartrate  50 mg Per Tube BID   multivitamin  1 tablet Per Tube QHS   OLANZapine zydis  2.5 mg Oral QHS   mouth rinse  15 mL Mouth Rinse 4 times per day   pantoprazole  40 mg Per Tube BID   rosuvastatin  10 mg Per Tube QPM   sodium bicarbonate  650 mg Oral BID   sodium zirconium cyclosilicate  10 g Oral Daily   sucralfate  1 g Per Tube Q6H   thiamine  100 mg Per Tube Daily   Continuous Infusions:  lactated ringers     PRN Meds:.dextrose, mouth rinse, phenol, phenylephrine-shark liver oil-mineral oil-petrolatum, Zinc Oxide Medications Prior to Admission:  Prior to Admission medications   Medication Sig Start Date End Date Taking? Authorizing Provider  acetaminophen (TYLENOL) 500 MG tablet Take 2 tablets (1,000 mg total) by mouth every 6 (six) hours. 05/25/22  Yes Romana Juniper, MD  amLODipine (NORVASC) 10 MG tablet Take 1 tablet (10 mg total) by mouth daily. 05/19/22 05/19/23 Yes Lacinda Axon, MD  aspirin EC 81 MG tablet Take 81 mg by mouth daily. Swallow whole.   Yes [provider]  Cholecalciferol (VITAMIN D3 PO) Take 1 capsule by mouth daily.   Yes [provider]  diclofenac Sodium (VOLTAREN) 1 % GEL Apply 4 g  topically 4 (four) times daily. Apply to both knees. 05/25/22  Yes Romana Juniper, MD  furosemide (LASIX) 40 MG tablet Take 1 tablet (40 mg total) by mouth daily. 07/01/22 07/01/23 Yes Katsadouros, Vasilios, MD  metoprolol succinate (TOPROL-XL) 100 MG 24 hr tablet Take 1 tablet (100 mg total) by mouth daily. Take with or immediately following a meal. Patient taking differently: Take 100 mg by mouth every evening. Take with or immediately following a meal. 05/19/22  Yes Amponsah, Charisse March, MD  OXYGEN Inhale 2 L into the lungs daily as needed (oxygen).   Yes [provider]  rosuvastatin (CRESTOR) 10 MG tablet Take 1 tablet (10 mg total) by mouth every evening. 05/19/22  Yes Amponsah, Charisse March, MD  sitaGLIPtin (JANUVIA) 25 MG tablet Take 1 tablet (25 mg total) by mouth daily. 06/16/22  Yes Masters, Katie, DO  albuterol (VENTOLIN HFA) 108 (90 Base) MCG/ACT inhaler Inhale 2 puffs into the lungs every 6 (six) hours as needed for wheezing or shortness of breath. Patient not taking: Reported on 08/25/2022 12/10/21   Rick Duff, MD  famotidine (PEPCID) 20 MG tablet Take 20 mg by mouth daily. Patient not taking: Reported on 08/25/2022 05/14/22   [provider]   Allergies  Allergen Reactions   Nifedipine Er Other (See Comments)    Caused nose bleeds in higher doses (90 mg., namely)   Latex Rash   Lipitor [Atorvastatin] Swelling and Other (See Comments)    Lip swelling to  lipitor Patient tolerates crestor   Review of Systems  Physical Exam Constitutional:      Appearance: She is morbidly obese.  Cardiovascular:     Rate and Rhythm: Normal rate.  Pulmonary:     Effort: Pulmonary effort is normal.  Skin:    General: Skin is warm and dry.  Neurological:     Mental Status: She is alert.     Vital Signs: BP 120/73   Pulse 80   Temp 98.3 F (36.8 C) (Oral)   Resp 16   Ht '5\' 8"'$  (1.727 m)   Wt 117.5 kg   SpO2 99%   BMI 39.38 kg/m  Pain Scale: 0-10 POSS *See  Group Information*: 1-Acceptable,Awake and alert Pain Score: 0-No pain   SpO2: SpO2: 99 % O2 Device:SpO2: 99 % O2 Flow Rate: .O2 Flow Rate (L/min): 2 L/min  IO: Intake/output summary:  Intake/Output Summary (Last 24 hours) at 09/20/2022 1212 Last data filed at 09/20/2022 0900 Gross per 24 hour  Intake 0 ml  Output 500 ml  Net -500 ml    LBM: Last BM Date : 09/20/22 Baseline Weight: Weight: 110 kg Most recent weight: Weight: 117.5 kg     Palliative Assessment/Data: 40%    Time In: 11:00 Time Out: 12:15 Time Total: 75 minutes Greater than 50%  of this time was spent counseling and coordinating care related to the above assessment and plan.  Signed by: Wadie Lessen, NP   Please contact Palliative Medicine Team phone at [phone] for questions and concerns.  For individual provider: See Shea Evans

## 2022-09-20 NOTE — Consult Note (Cosign Needed Addendum)
Face-to-Face Psychiatry Consult   Reason for Consult:  "Patient previously assessed by psych, now hopspital day 27. Multiple GI conditions which are maximally medically managed. Has significant food aversion from these chronic conditions and also appears significantly depressed. Trying to start eating again now. Have tried remeron and now zyprexa to improve appetite. Is she a candidate for ECT due to refractory depression and poor PO intake, or are there any other possible options to encourage appetite?" Referring Physician:  S. Washburn  Patient Identification: Alicia [name] MRN:  [id number] Principal Diagnosis: Chronic generalized abdominal pain Diagnosis:  Principal Problem:   Chronic generalized abdominal pain Active Problems:   Acute worsening of stage 4 chronic kidney disease (HCC)   Generalized weakness   Pressure injury of skin   Nausea and vomiting   Diarrhea   Duodenal ulcer   Mucosal abnormality of stomach   Mucosal abnormality of esophagus   Food aversion   Hypoalbuminemia due to protein-calorie malnutrition ([place])  Summary: Alicia [name] is a 71 y.o. year old female currently admitted on the IMT[facility] HD#26 for diffuse abdominal pain of unknown etiology, as well as severely decreased PO intake.   Total Time spent with patient: 30 minutes  Subjective:   Seen and assessed at bedside. Denies significant mood, anxiety, or psychotic symptoms. Denies depressed mood, anhedonia, poor sleep. Does endorse not eating well that she reports to be multifactorial including anxiety due to pain, prior nausea, prior vomiting, prior abdominal pain. She does state she is drinking nutritional shakes every day is tolerating these. She is open to drinking these despite poor appetite. She states she is excited to possibly eating crab legs and lobsters. Encouraged patient to continue to get up in chair to allow for more activation during the day.   Denies present SI/HI/AVH  Past  Psychiatric History:  Denies previous psych dx Denies previous psych hosp or suicide attempt Denies current home psych meds PTA Reports taking an unknown psych med for a few weeks after head trauma many years ago  Denies family psych history known to her. Reports niece attempted suicide.   Risk to Self:  denies Risk to Others:  denies Prior Inpatient Therapy:  denies Prior Outpatient Therapy:  denies  Past Medical History:  Past Medical History:  Diagnosis Date   Allergy    Arthritis    Chronic headaches    Constipation    Depression    Diabetes mellitus    GERD (gastroesophageal reflux disease)    Hyperlipidemia    Hypertension    Obesity    PAD (peripheral artery disease) ([place])    stage 4    Stroke [facility])     Past Surgical History:  Procedure Laterality Date   ABDOMINAL AORTOGRAM W/LOWER EXTREMITY N/A 12/17/2018   Procedure: ABDOMINAL AORTOGRAM W/LOWER EXTREMITY;  Surgeon: Waynetta Sandy, MD;  Location: [facility];  Service: Cardiovascular;  Laterality: N/A;   ARTERY BIOPSY Left 03/17/2022   Procedure: BIOPSY TEMPORAL ARTERY;  Surgeon: Cherre Robins, MD;  Location: [facility];  Service: Vascular;  Laterality: Left;   BIOPSY  09/01/2022   Procedure: BIOPSY;  Surgeon: Yetta Flock, MD;  Location: [facility];  Service: Gastroenterology;;   ESOPHAGOGASTRODUODENOSCOPY (EGD) WITH PROPOFOL N/A 09/01/2022   Procedure: ESOPHAGOGASTRODUODENOSCOPY (EGD) WITH PROPOFOL;  Surgeon: Yetta Flock, MD;  Location: [facility];  Service: Gastroenterology;  Laterality: N/A;   FEMORAL BYPASS  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 Bridgett Larsson  PERIPHERAL VASCULAR ATHERECTOMY Left 12/17/2018   Procedure: PERIPHERAL VASCULAR ATHERECTOMY;  Surgeon: Waynetta Sandy, MD;  Location: [facility];  Service: Cardiovascular;  Laterality: Left;   PERIPHERAL VASCULAR BALLOON ANGIOPLASTY Left 12/17/2018   Procedure: PERIPHERAL  VASCULAR BALLOON ANGIOPLASTY;  Surgeon: Waynetta Sandy, MD;  Location: [facility];  Service: Cardiovascular;  Laterality: Left;   Family History:  Family History  Problem Relation Age of Onset   Diabetes Father    Heart disease Mother        NOT before age 87-  Bypass   Hypertension Mother    Hypertension Sister    Varicose Veins Brother    Heart disease Brother        Before age 43   Hypertension Daughter    Colon cancer Neg Hx    Esophageal cancer Neg Hx    Rectal cancer Neg Hx    Stomach cancer Neg Hx     Social History:  Social History   Substance and Sexual Activity  Alcohol Use Yes   Alcohol/week: 1.0 standard drink of alcohol   Types: 1 Standard drinks or equivalent per week   Comment: occasional use only     Social History   Substance and Sexual Activity  Drug Use No    Social History   Socioeconomic History   Marital status: Single    Spouse name: Not on file   Number of children: Not on file   Years of education: Not on file   Highest education level: Not on file  Occupational History   Not on file  Tobacco Use   Smoking status: Light Smoker    Packs/day: 0.50    Years: 35.00    Total pack years: 17.50    Types: Cigarettes   Smokeless tobacco: Never   Tobacco comments:    patient is trying to quit and has gone down to half a pack per day 1 per day.  Vaping Use   Vaping Use: Never used  Substance and Sexual Activity   Alcohol use: Yes    Alcohol/week: 1.0 standard drink of alcohol    Types: 1 Standard drinks or equivalent per week    Comment: occasional use only   Drug use: No   Sexual activity: Not Currently  Other Topics Concern   Not on file  Social History Narrative   Not on file   Social Determinants of Health   Financial Resource Strain: Not on file  Food Insecurity: No Food Insecurity (09/04/2022)   Hunger Vital Sign    Worried About Running Out of Food in the Last Year: Never true    Ran Out of Food in the  Last Year: Never true  Transportation Needs: Unmet Transportation Needs (09/04/2022)   PRAPARE - Hydrologist (Medical): Yes    Lack of Transportation (Non-Medical): Yes  Physical Activity: Inactive (07/28/2021)   Exercise Vital Sign    Days of Exercise per Week: 0 days    Minutes of Exercise per Session: 0 min  Stress: Not on file  Social Connections: Not on file   Additional Social History:    Allergies:   Allergies  Allergen Reactions   Nifedipine Er Other (See Comments)    Caused nose bleeds in higher doses (90 mg., namely)   Latex Rash   Lipitor [Atorvastatin] Swelling and Other (See Comments)    Lip swelling to lipitor Patient tolerates crestor    Labs:  Results for orders  placed or performed during the hospital encounter of 08/24/22 (from the past 48 hour(s))  Glucose, capillary     Status: None   Collection Time: 09/18/22  4:47 PM  Result Value Ref Range   Glucose-Capillary 71 70 - 99 mg/dL    Comment: Glucose reference range applies only to samples taken after fasting for at least 8 hours.  Glucose, capillary     Status: Abnormal   Collection Time: 09/18/22  8:29 PM  Result Value Ref Range   Glucose-Capillary 112 (H) 70 - 99 mg/dL    Comment: Glucose reference range applies only to samples taken after fasting for at least 8 hours.  Glucose, capillary     Status: Abnormal   Collection Time: 09/19/22 12:21 AM  Result Value Ref Range   Glucose-Capillary 153 (H) 70 - 99 mg/dL    Comment: Glucose reference range applies only to samples taken after fasting for at least 8 hours.  Glucose, capillary     Status: Abnormal   Collection Time: 09/19/22  4:29 AM  Result Value Ref Range   Glucose-Capillary 170 (H) 70 - 99 mg/dL    Comment: Glucose reference range applies only to samples taken after fasting for at least 8 hours.  Glucose, capillary     Status: Abnormal   Collection Time: 09/19/22  7:15 AM  Result Value Ref Range    Glucose-Capillary 231 (H) 70 - 99 mg/dL    Comment: Glucose reference range applies only to samples taken after fasting for at least 8 hours.  Basic metabolic panel     Status: Abnormal   Collection Time: 09/19/22  7:38 AM  Result Value Ref Range   Sodium 133 (L) 135 - 145 mmol/L   Potassium 4.4 3.5 - 5.1 mmol/L   Chloride 99 98 - 111 mmol/L   CO2 18 (L) 22 - 32 mmol/L   Glucose, Bld 215 (H) 70 - 99 mg/dL    Comment: Glucose reference range applies only to samples taken after fasting for at least 8 hours.   BUN 61 (H) 8 - 23 mg/dL   Creatinine, Ser 3.63 (H) 0.44 - 1.00 mg/dL   Calcium 8.1 (L) 8.9 - 10.3 mg/dL   GFR, Estimated 13 (L) >60 mL/min    Comment: (NOTE) Calculated using the CKD-EPI Creatinine Equation (2021)    Anion gap 16 (H) 5 - 15    Comment: Performed at [facility] [address] [place], [place] [address]  Hepatic function panel     Status: Abnormal   Collection Time: 09/19/22  7:38 AM  Result Value Ref Range   Total Protein 5.2 (L) 6.5 - 8.1 g/dL   Albumin 1.9 (L) 3.5 - 5.0 g/dL   AST 53 (H) 15 - 41 U/L   ALT 89 (H) 0 - 44 U/L   Alkaline Phosphatase 540 (H) 38 - 126 U/L   Total Bilirubin 0.4 0.3 - 1.2 mg/dL   Bilirubin, Direct 0.2 0.0 - 0.2 mg/dL   Indirect Bilirubin 0.2 (L) 0.3 - 0.9 mg/dL    Comment: Performed at [facility] [address] [place] [address]  CBC     Status: Abnormal   Collection Time: 09/19/22  7:38 AM  Result Value Ref Range   WBC 9.1 4.0 - 10.5 K/uL   RBC 3.56 (L) 3.87 - 5.11 MIL/uL   Hemoglobin 11.4 (L) 12.0 - 15.0 g/dL   HCT 34.1 (L) 36.0 - 46.0 %   MCV 95.8 80.0 -  100.0 fL   MCH 32.0 26.0 - 34.0 pg   MCHC 33.4 30.0 - 36.0 g/dL   RDW 18.3 (H) 11.5 - 15.5 %   Platelets 330 150 - 400 K/uL   nRBC 0.0 0.0 - 0.2 %    Comment: Performed at [facility] [address] [place], [place] [address]  Lipid panel     Status: Abnormal   Collection Time: 09/19/22  7:38 AM  Result Value Ref Range   Cholesterol  80 0 - 200 mg/dL   Triglycerides 84 <150 mg/dL   HDL 40 (L) >40 mg/dL   Total CHOL/HDL Ratio 2.0 RATIO   VLDL 17 0 - 40 mg/dL   LDL Cholesterol 23 0 - 99 mg/dL    Comment:        Total Cholesterol/HDL:CHD Risk Coronary Heart Disease Risk Table                     Men   Women  1/2 Average Risk   3.4   3.3  Average Risk       5.0   4.4  2 X Average Risk   9.6   7.1  3 X Average Risk  23.4   11.0        Use the calculated Patient Ratio above and the CHD Risk Table to determine the patient's CHD Risk.        ATP III CLASSIFICATION (LDL):  <100     mg/dL   Optimal  100-129  mg/dL   Near or Above                    Optimal  130-159  mg/dL   Borderline  160-189  mg/dL   High  >190     mg/dL   Very High Performed at [facility] [address], [place], [place] [address]   Hemoglobin A1c     Status: None   Collection Time: 09/19/22  7:38 AM  Result Value Ref Range   Hgb A1c MFr Bld 5.3 4.8 - 5.6 %    Comment: RESULTS CONFIRMED BY MANUAL DILUTION (NOTE) Pre diabetes:          5.7%-6.4%  Diabetes:              >6.4%  Glycemic control for   <7.0% adults with diabetes    Mean Plasma Glucose 105.41 mg/dL    Comment: Performed at [facility] [address], [place], [place] [address]  Glucose, capillary     Status: Abnormal   Collection Time: 09/19/22 11:18 AM  Result Value Ref Range   Glucose-Capillary 126 (H) 70 - 99 mg/dL    Comment: Glucose reference range applies only to samples taken after fasting for at least 8 hours.  Glucose, capillary     Status: Abnormal   Collection Time: 09/19/22  4:24 PM  Result Value Ref Range   Glucose-Capillary 115 (H) 70 - 99 mg/dL    Comment: Glucose reference range applies only to samples taken after fasting for at least 8 hours.  Glucose, capillary     Status: Abnormal   Collection Time: 09/19/22  8:37 PM  Result Value Ref Range   Glucose-Capillary 149 (H) 70 - 99 mg/dL    Comment: Glucose reference range applies only to  samples taken after fasting for at least 8 hours.  Glucose, capillary     Status: Abnormal   Collection Time: 09/19/22 11:58 PM  Result Value Ref Range   Glucose-Capillary 170 (H) 70 - 99 mg/dL    Comment: Glucose reference range applies only to samples taken after fasting for at least 8 hours.  Glucose, capillary     Status: Abnormal   Collection Time: 09/20/22  3:54 AM  Result Value Ref Range   Glucose-Capillary 196 (H) 70 - 99 mg/dL    Comment: Glucose reference range applies only to samples taken after fasting for at least 8 hours.  Basic metabolic panel     Status: Abnormal   Collection Time: 09/20/22  4:43 AM  Result Value Ref Range   Sodium 130 (L) 135 - 145 mmol/L   Potassium 5.5 (H) 3.5 - 5.1 mmol/L   Chloride 98 98 - 111 mmol/L   CO2 17 (L) 22 - 32 mmol/L   Glucose, Bld 217 (H) 70 - 99 mg/dL    Comment: Glucose reference range applies only to samples taken after fasting for at least 8 hours.   BUN 65 (H) 8 - 23 mg/dL   Creatinine, Ser 3.82 (H) 0.44 - 1.00 mg/dL   Calcium 7.8 (L) 8.9 - 10.3 mg/dL   GFR, Estimated 12 (L) >60 mL/min    Comment: (NOTE) Calculated using the CKD-EPI Creatinine Equation (2021)    Anion gap 15 5 - 15    Comment: Performed at [facility] [address] [place], [place] [address]  Glucose, capillary     Status: Abnormal   Collection Time: 09/20/22  7:52 AM  Result Value Ref Range   Glucose-Capillary 215 (H) 70 - 99 mg/dL    Comment: Glucose reference range applies only to samples taken after fasting for at least 8 hours.  Phosphorus     Status: None   Collection Time: 09/20/22 10:12 AM  Result Value Ref Range   Phosphorus 3.6 2.5 - 4.6 mg/dL    Comment: Performed at [facility], [place] [address] [place], [place] [address]  Glucose, capillary     Status: Abnormal   Collection Time: 09/20/22 11:39 AM  Result Value Ref Range   Glucose-Capillary 160 (H) 70 - 99 mg/dL    Comment: Glucose reference range applies only to samples  taken after fasting for at least 8 hours.    Current Facility-Administered Medications  Medication Dose Route Frequency Provider Last Rate Last Admin   acetaminophen (TYLENOL) suppository 650 mg  650 mg Rectal Q6H Pham, Minh Q, RPH-CPP       Or   acetaminophen (TYLENOL) 160 MG/5ML solution 1,000 mg  1,000 mg Per Tube Q6H Pham, Minh Q, RPH-CPP   1,000 mg at 09/20/22 1229   amLODipine (NORVASC) tablet 10 mg  10 mg Per Tube Daily Pham, Minh Q, RPH-CPP   10 mg at 09/20/22 1021   dextrose 50 % solution 25 mL  25 mL Intravenous PRN Linus Galas, MD       dicyclomine (BENTYL) capsule 10 mg  10 mg Per Tube TID AC & HS Pham, Minh Q, RPH-CPP   10 mg at 09/20/22 1229   DULoxetine (CYMBALTA) DR capsule 30 mg  30 mg Oral BID Nooruddin, Saad, MD   30 mg at 09/19/22 0847   enoxaparin (LOVENOX) injection 30 mg  30 mg Subcutaneous Q24H Axel Filler, MD   30 mg at 09/19/22 2256   feeding supplement (ENSURE ENLIVE / ENSURE PLUS) liquid 237 mL  237 mL Oral TID BM Axel Filler, MD   237 mL at 09/20/22 1251  feeding supplement (OSMOLITE 1.5 CAL) liquid 1,000 mL  1,000 mL Per Tube Q24H Axel Filler, MD       folic acid (FOLVITE) tablet 1 mg  1 mg Per Tube Daily Pham, Minh Q, RPH-CPP   1 mg at 09/20/22 1021   free water 200 mL  200 mL Per Tube Q4H Rick Duff, MD   200 mL at 09/20/22 1251   insulin aspart (novoLOG) injection 0-5 Units  0-5 Units Subcutaneous Q4H Leigh Aurora, DO   2 Units at 09/19/22 0848   insulin glargine-yfgn (SEMGLEE) injection 6 Units  6 Units Subcutaneous QHS Rick Duff, MD   6 Units at 09/20/22 0030   lactated ringers infusion   Intravenous Continuous Linus Galas, MD 75 mL/hr at 09/20/22 1251 New Bag at 09/20/22 1251   lipase/protease/amylase (CREON) capsule 24,000 Units  24,000 Units Oral TID Rick Duff, MD   24,000 Units at 09/18/22 1719   metoCLOPramide (REGLAN) 5 MG/5ML solution 10 mg  10 mg Per Tube TID AC Jodell Cipro,  Sriramkumar, MD   10 mg at 09/20/22 1229   metoprolol tartrate (LOPRESSOR) tablet 50 mg  50 mg Per Tube BID Angelica Pou, MD   50 mg at 09/20/22 1021   multivitamin (RENA-VIT) tablet 1 tablet  1 tablet Per Tube QHS Axel Filler, MD   1 tablet at 09/19/22 2255   OLANZapine zydis (ZYPREXA) disintegrating tablet 2.5 mg  2.5 mg Oral QHS Linus Galas, MD   2.5 mg at 09/20/22 0146   Oral care mouth rinse  15 mL Mouth Rinse 4 times per day Axel Filler, MD   15 mL at 09/20/22 0800   Oral care mouth rinse  15 mL Mouth Rinse PRN Axel Filler, MD       pantoprazole (PROTONIX) 2 mg/mL oral suspension 40 mg  40 mg Per Tube BID Pham, Minh Q, RPH-CPP   40 mg at 09/20/22 1026   phenol (CHLORASEPTIC) mouth spray 1 spray  1 spray Mouth/Throat PRN Rick Duff, MD       phenylephrine-shark liver oil-mineral oil-petrolatum (PREPARATION H) rectal ointment 1 Application  1 Application Rectal BID PRN Rick Duff, MD       rosuvastatin (CRESTOR) tablet 10 mg  10 mg Per Tube QPM Pham, Minh Q, RPH-CPP   10 mg at 09/19/22 1755   sodium bicarbonate tablet 650 mg  650 mg Oral BID Gean Quint, MD   650 mg at 09/20/22 1021   sodium zirconium cyclosilicate (LOKELMA) packet 10 g  10 g Oral Daily Linus Galas, MD   10 g at 09/20/22 1231   sucralfate (CARAFATE) 1 GM/10ML suspension 1 g  1 g Per Tube Q6H Pham, Minh Q, RPH-CPP   1 g at 09/20/22 1229   thiamine (VITAMIN B1) tablet 100 mg  100 mg Per Tube Daily Pham, Minh Q, RPH-CPP   100 mg at 09/20/22 1022   Zinc Oxide (TRIPLE PASTE) 12.8 % ointment   Topical PRN Angelica Pou, MD   Given at 09/13/22 1617           Psychiatric Specialty Exam:  Presentation  General Appearance:  Appropriate for Environment; Casual  Eye Contact: Fair  Speech: Slow  Speech Volume: Decreased  Handedness:No data recorded  Mood and Affect  Mood: Euthymic  Affect: Constricted   Thought Process   Thought Processes: Coherent; Goal Directed; Linear  Descriptions of Associations:Intact  Orientation:Full (Time, Place and Person)  Thought Content:Logical  Hallucinations:Hallucinations: None   Ideas  of Reference:None  Suicidal Thoughts:Suicidal Thoughts: No   Homicidal Thoughts:Homicidal Thoughts: No    Sensorium  Memory: Immediate Good; Recent Good; Remote Good  Judgment: Fair  Insight: Fair   Psychiatric nurse  Attention Span: Fair  Recall: [place] of Knowledge:Good  Language:Good   Psychomotor Activity  Psychomotor Activity:Psychomotor Activity: Decreased   Assets  Assets:Communication Skills; Desire for Improvement; Social Support   Sleep  Sleep: Sleep: Fair    Physical Exam: Physical Exam Vitals reviewed.  Pulmonary:     Effort: Pulmonary effort is normal.    Review of Systems  Psychiatric/Behavioral:  Positive for depression. Negative for hallucinations, substance abuse and suicidal ideas. The patient has insomnia. The patient is not nervous/anxious.    Blood pressure 120/73, pulse 80, temperature 98.3 F (36.8 C), temperature source Oral, resp. rate 16, height '5\' 8"'$  (1.727 m), weight 117.5 kg, SpO2 99 %. Body mass index is 39.38 kg/m.  Treatment Plan Summary:  Assessment: -Major depressive disorder, moderate, w/o psychotic features. Low appetite or decreased food intake - more likely due to learned avoidence (conditioning from painful stimuli) 2/2 pain or other GI symptoms. Does not meet criteria for ECT due to lack of symptoms. Plan: -does not require inpt psych admission or 1:1 sitter -Continue zyprexa zydis 2.5 mg qhs for appetite stimulation  -consider periactin as a possible appetite stimulant should zydis be uptitrated and continue to be insufficient -Outpatient CBT for appetite -Cymbalta continued for depression.   Disposition: Patient does not meet criteria for psychiatric inpatient  admission. Psych service will continue to follow.  France Ravens, MD 09/20/2022 2:21 PM

## 2022-09-20 NOTE — Progress Notes (Signed)
Calorie Count Note: Day 1 Results  Calorie count ordered and was started on 10/30 at breakfast meal. Please see day 1 results below.  Spoke with pt in room. Lunch tray was untouched. RD brought in chocolate Ensure supplement and pt consumed 3 sips with maximum encouragement. Pt refused to drink any additional Ensure.  Diet: regular, thin liquids Supplements:  - Ensure Enlive po TID, each supplement provides 350 kcal and 20 grams of protein  09/19/22: Breakfast: 0 kcal, 0 grams of protein Lunch: 0 kcal, 0 grams of protein Dinner: 0 kcal, 0 grams of protein Supplements: no documentation available (3 sips of Ensure today)  Day 1 total intake: 0 kcal (0% of minimum estimated needs)  0 grams of protein (0% of minimum estimated needs)  Nutrition Diagnosis: Inadequate oral intake related to inability to eat, nausea as evidenced by meal completion < 50%.  Goal: Patient will meet greater than or equal to 90% of their needs.  Intervention:  - Continue calorie count, RD to follow up with day 2 results tomorrow 09/21/22 - Continue to offer Ensure Enlive TID - Encourage PO intake and provide feeding assistance as needed - RD to adjust nocturnal tube feeding regimen to provide additional kcal and protein given pt meeting 0% of kcal and protein needs. Will change to Osmolite 1.5 formula at rate of 70 ml/hr over 14 hours daily (1800 to 0800) to provide 1470 kcal, 61 grams of protein, and 747 ml of free water. This meets 86% of minimum kcal needs and 76% of minimum protein needs.   Gustavus Bryant, MS, RD, LDN Inpatient Clinical Dietitian Please see [facility] for contact information.

## 2022-09-20 NOTE — Progress Notes (Addendum)
   Daily Progress Note  Hospital Day: 28  Chief Complaint:  abdominal pain, loose stool  Brief History Patient is a 71 yo female with a pmh of H.pylori,gastritis, PUD, chronic nausea, chronic abdominal pain, colon polyps, HTN, HLD, obesity, PAD, CVA, CKD4, heart failure, headaches. Seen in consultation by us on  10/11 for evaluation of post-prandial abdominal pain , N/V.    Assessment / Plan   # 71 yo female with chronic nausea /chronic abdominal pain. Now with prolonged admission for symptoms in addition to loose stool.  We saw her earlier this admission. Findings to date include PUD,  possible pancreatic insufficiency and now new elevation in LFTs  Etiology of symptoms have not been clear and no lasting improvement with any medications or other interventions So far:  Unrevealing non-contrast CT scan, mesenteric US, MRA angio abdomen ( limited study due to artifact but celiac axis and SMA without significant obstructive disease) RUQ US,  cortisol level, celiac serologies, stool studies ( to check for infection) Lipase was mildly elevated at 70 ( less than 2xULN).  EGD on 10/12 showing non-bleeding duodenal ulcers and duodenitis. Biopsies negative for H. Pylori. Treated with PPI and carafate.   Initially when we saw her in consult the diarrhea had resolved. Actually had a rectal stool burden on imaging. We treated her for constipation. At some point loose stool returned.  Fecal elastase did return low at 50 . Started on Creon but taking only sporadically Taking Reglan for possible gastroparesis  Taking dicyclomine for abdominal pain  Psychiatry has evaluated, doesn't meet inpatient psych criteria. Recommended to continue Zyprexa, cymbalta and consider periactin for appetite stimulant. Plan: Repeat lipase. Obtain Alk phos isoenzymes, GGT Repeat CT scan today with oral contrast. EGD tomorrow to reassess ulcers / duodenitis.  The risks and benefits of EGD with possible biopsies were discussed  with the patient who agrees to proceed.  Repeat stool studies ( GI path panel and C-dff) Continue PPI, will change to IV as hospital no longer able to get the suspension.  After EGD tomorrow, will hopefully be able stop Carafate since she has CKD  Query if some of her symptoms and elevated LFTs could be related to congestive hepatopathy. If I+0 correct she is + 15 Liter fluid balance, she has BLE pitting edema and substantial weight gain. Has hypoalbuminemia.   Nephrology following   Subjective   Continues to have generalized upper abdominal pain when trying to eat. She also has generalized lower abdominal pain. Having nausea if she tries to eat. She is having several loose BMs a day.   Objective   Imaging:  No results found.  Lab Results: Recent Labs    09/19/22 0738  WBC 9.1  HGB 11.4*  HCT 34.1*  PLT 330   BMET Recent Labs    09/18/22 0338 09/19/22 0738 09/20/22 0443  NA 132* 133* 130*  K 4.5 4.4 5.5*  CL 103 99 98  CO2 18* 18* 17*  GLUCOSE 103* 215* 217*  BUN 57* 61* 65*  CREATININE 3.42* 3.63* 3.82*  CALCIUM 7.7* 8.1* 7.8*   LFT Recent Labs    09/19/22 0738  PROT 5.2*  ALBUMIN 1.9*  AST 53*  ALT 89*  ALKPHOS 540*  BILITOT 0.4  BILIDIR 0.2  IBILI 0.2*   PT/INR No results for input(s): "LABPROT", "INR" in the last 72 hours.   Scheduled inpatient medications:   acetaminophen  650 mg Rectal Q6H   Or   acetaminophen (TYLENOL) oral liquid   160 mg/5 mL  1,000 mg Per Tube Q6H   amLODipine  10 mg Per Tube Daily   dicyclomine  10 mg Per Tube TID AC & HS   DULoxetine  30 mg Oral BID   enoxaparin (LOVENOX) injection  30 mg Subcutaneous Q24H   feeding supplement  237 mL Oral TID BM   feeding supplement (OSMOLITE 1.5 CAL)  1,000 mL Per Tube I32P   folic acid  1 mg Per Tube Daily   free water  200 mL Per Tube Q4H   insulin aspart  0-5 Units Subcutaneous Q4H   insulin glargine-yfgn  6 Units Subcutaneous QHS   lipase/protease/amylase  24,000 Units Oral TID    metoCLOPramide  10 mg Per Tube TID AC   metoprolol tartrate  50 mg Per Tube BID   multivitamin  1 tablet Per Tube QHS   OLANZapine zydis  2.5 mg Oral QHS   mouth rinse  15 mL Mouth Rinse 4 times per day   pantoprazole  40 mg Per Tube BID   rosuvastatin  10 mg Per Tube QPM   sodium bicarbonate  650 mg Oral BID   sodium zirconium cyclosilicate  10 g Oral Daily   sucralfate  1 g Per Tube Q6H   thiamine  100 mg Per Tube Daily   Continuous inpatient infusions:   lactated ringers 75 mL/hr at 09/20/22 1251   PRN inpatient medications: dextrose, mouth rinse, phenol, phenylephrine-shark liver oil-mineral oil-petrolatum, Zinc Oxide  Vital signs in last 24 hours: Temp:  [97.3 F (36.3 C)-98.9 F (37.2 C)] 98.3 F (36.8 C) (10/31 0831) Pulse Rate:  [75-80] 80 (10/31 0831) Resp:  [16-18] 16 (10/31 0831) BP: (120-147)/(60-97) 120/73 (10/31 0831) SpO2:  [99 %-100 %] 99 % (10/31 0831) Weight:  [117.5 kg] 117.5 kg (10/31 0441) Last BM Date : 09/20/22  Intake/Output Summary (Last 24 hours) at 09/20/2022 1518 Last data filed at 09/20/2022 1445 Gross per 24 hour  Intake 130.22 ml  Output 500 ml  Net -369.78 ml    Intake/Output from previous day: 10/30 0701 - 10/31 0700 In: 0  Out: 300 [Urine:300] Intake/Output this shift: Total I/O In: 130.2 [I.V.:130.2] Out: 200 [Emesis/NG output:200]   Physical Exam:  General: Alert female in NAD.  Heart:  Regular rate and rhythm. BLE pitting edema Pulmonary: Normal respiratory effort Abdomen: Soft, obese, NT ,normal bowel sounds.  Neurologic: Alert and oriented Psych: Pleasant. Cooperative.    Principal Problem:   Chronic generalized abdominal pain Active Problems:   Acute worsening of stage 4 chronic kidney disease (HCC)   Generalized weakness   Pressure injury of skin   Nausea and vomiting   Diarrhea   Duodenal ulcer   Mucosal abnormality of stomach   Mucosal abnormality of esophagus   Food aversion   Hypoalbuminemia due to  protein-calorie malnutrition ([place])     LOS: 21 days   Tye Savoy ,NP 09/20/2022, 3:18 PM    Attending physician's note   I have taken history, reviewed the chart and examined the patient. I performed a substantive portion of this encounter, including complete performance of at least one of the key components, in conjunction with the APP. I agree with the Advanced Practitioner's note, impression and recommendations.   Discussed in detail with Nevin Bloodgood.  This is a reconsult.  Prolonged adm.  Chronic abdominal pain with chronic nausea, decreased appetite and failure to thrive on nocturnal core track feeding. New onset abn LFTs- ?etology. ?Congestive hepatomegaly Diarrhea- ?Related to tube feeds. R/O  other causes. Neg colon 06/2019 except for div, small TA. Multiple comorbidities as above eluding hypoalbuminemia  Plan: -IV Protonix -CT Abdo/pelvis with oral contrast.  No IV contrast d/t CKD -EGD in AM to reassess ulcers. -Recheck lipase, GGT, alkaline phos isoenzymes. -Would also repeat stool studies. -She has significant wt gain with +15lit fluid balance since adm. Will discuss with renal in AM.   Carmell Austria, MD [facility] [phone]

## 2022-09-20 NOTE — Progress Notes (Addendum)
[facility] Progress Note   Assessment/ Plan:   AKI on CKD 4: BL possibly 2.5-3 but fluctuates. Previously was followed by Dr Joylene Grapes  - main issue looks like no Po intake  - agree with IVFs  - dont' expect this BUN to cause uremia- no indication for RRT  - dialysis would not be of utility in this setting- she appears profoundly depressed and if she's not eating dialysis essentially prolongs death by starvation.    - Zyprexa replaced remeron recently  - needs palliative care c/s, appreciate assistance  Depression:  - Zyprexa  - already tried remeron without much improvement  Abd pain: unknown etiology. NG tube in place. Extensive workup.    HTN: BP slightly high. Cont current meds   Possible renal artery stenosis: Likely indicative of underlying vascular disease involving the kidneys.  Does not change her management at this time.  Subjective:    Re c/s for creeping creatinine.  Brifely pt with prolonged GI issues with poor PO intake and malnutrition.  Issues looks like she is profoundly depressed, nearly catatonic.  Prolonged hospital course without much progress.   Objective:   BP 120/73   Pulse 80   Temp 98.3 F (36.8 C) (Oral)   Resp 16   Ht '5\' 8"'$  (1.727 m)   Wt 117.5 kg   SpO2 99%   BMI 39.38 kg/m   Intake/Output Summary (Last 24 hours) at 09/20/2022 1306 Last data filed at 09/20/2022 0900 Gross per 24 hour  Intake 0 ml  Output 500 ml  Net -500 ml   Weight change:   Physical Exam: RSW:NIOE affect, sitting in chair CVS:RRR Resp: clear VOJ:JKKX Ext: 1+ LE edema  Imaging: No results found.  Labs: BMET Recent Labs  Lab 09/14/22 0809 09/16/22 1046 09/17/22 1057 09/18/22 0338 09/19/22 0738 09/20/22 0443 09/20/22 1012  NA 133* 134* 129* 132* 133* 130*  --   K 5.3* 4.5 6.0* 4.5 4.4 5.5*  --   CL 105 104 99 103 99 98  --   CO2 16* 17* 19* 18* 18* 17*  --   GLUCOSE 183* 90 137* 103* 215* 217*  --   BUN 52* 55* 56* 57* 61* 65*  --    CREATININE 3.70* 3.53* 3.53* 3.42* 3.63* 3.82*  --   CALCIUM 7.5* 7.7* 7.6* 7.7* 8.1* 7.8*  --   PHOS 2.9 2.9  --   --   --   --  3.6   CBC Recent Labs  Lab 09/14/22 0809 09/16/22 1046 09/17/22 1057 09/19/22 0738  WBC 10.1 9.1 9.4 9.1  HGB 10.6* 10.4* 10.3* 11.4*  HCT 30.9* 30.0* 29.2* 34.1*  MCV 92.2 92.0 92.4 95.8  PLT 260 269 259 330    Medications:     acetaminophen  650 mg Rectal Q6H   Or   acetaminophen (TYLENOL) oral liquid 160 mg/5 mL  1,000 mg Per Tube Q6H   amLODipine  10 mg Per Tube Daily   dicyclomine  10 mg Per Tube TID AC & HS   DULoxetine  30 mg Oral BID   enoxaparin (LOVENOX) injection  30 mg Subcutaneous Q24H   feeding supplement  237 mL Oral TID BM   feeding supplement (OSMOLITE 1.5 CAL)  1,000 mL Per Tube F81W   folic acid  1 mg Per Tube Daily   free water  200 mL Per Tube Q4H   insulin aspart  0-5 Units Subcutaneous Q4H   insulin glargine-yfgn  6 Units Subcutaneous QHS  lipase/protease/amylase  24,000 Units Oral TID   metoCLOPramide  10 mg Per Tube TID AC   metoprolol tartrate  50 mg Per Tube BID   multivitamin  1 tablet Per Tube QHS   OLANZapine zydis  2.5 mg Oral QHS   mouth rinse  15 mL Mouth Rinse 4 times per day   pantoprazole  40 mg Per Tube BID   rosuvastatin  10 mg Per Tube QPM   sodium bicarbonate  650 mg Oral BID   sodium zirconium cyclosilicate  10 g Oral Daily   sucralfate  1 g Per Tube Q6H   thiamine  100 mg Per Tube Daily    Madelon Lips MD 09/20/2022, 1:06 PM

## 2022-09-20 NOTE — Progress Notes (Signed)
Subjective:   Summary: Alicia [name] is a 71 y.o. year old female currently admitted on the [facility] HD#27 for diffuse abdominal pain as well as severely decreased PO intake likely multifactorial in etiology due to pancreatic insufficiency, chronic IMA occlusion, duodenal ulcers.  Overnight Events:  -NAEON - Calorie counts from yesterday show 0% intake.  She mentions that she ate a few bites of chicken, some banana, and some chocolate pudding.  We discussed how she will need to continue advancing her p.o. intake.  Goal for today is to ensures as well as 2 meals as well as some p.o. liquids.  Her abdominal pain continues to be adequately controlled as does her nausea.  She does still have some diarrhea, but this is significantly better than before.  She had been declining her p.o. Creon, and we encouraged her to try to take this medicine as it cannot be administered per tube.  Objective:  Vital signs in last 24 hours: Vitals:   09/19/22 0949 09/19/22 1629 09/19/22 2038 09/20/22 0441  BP: 138/68 128/60 (!) 147/97 122/78  Pulse: 68 75 78 76  Resp:   18 16  Temp: 98.1 F (36.7 C) (!) 97.3 F (36.3 C) 98.9 F (37.2 C) 98.6 F (37 C)  TempSrc: Oral Oral  Oral  SpO2: (!) 66% 100% 100% 100%  Weight:    117.5 kg  Height:       Supplemental O2: Room Air    Physical Exam:  Constitutional: NAD HEENT: Feeding tube in place Abdominal: minimal diffuse TTP, no guarding or rebound.  Distended but soft.  Periumbilical hernia unchanged.  Extremities: no lower extremity edema present. Neuro/Psych: Affect slightly improved today when discussing her progress, interacts and answers questions   Intake/Output Summary (Last 24 hours) at 09/20/2022 0634 Last data filed at 09/20/2022 0439 Gross per 24 hour  Intake 0 ml  Output 300 ml  Net -300 ml    Net IO Since Admission: 25,533.3 mL [09/20/22 0634]  Pertinent Labs: Unable to obtain labs today after multiple attempts     Latest Ref Rng & Units 09/19/2022    7:38 AM 09/17/2022   10:57 AM 09/16/2022   10:46 AM  CBC  WBC 4.0 - 10.5 K/uL 9.1  9.4  9.1   Hemoglobin 12.0 - 15.0 g/dL 11.4  10.3  10.4   Hematocrit 36.0 - 46.0 % 34.1  29.2  30.0   Platelets 150 - 400 K/uL 330  259  269        Latest Ref Rng & Units 09/20/2022    4:43 AM 09/19/2022    7:38 AM 09/18/2022    3:38 AM  CMP  Glucose 70 - 99 mg/dL 217  215  103   BUN 8 - 23 mg/dL 65  61  57   Creatinine 0.44 - 1.00 mg/dL 3.82  3.63  3.42   Sodium 135 - 145 mmol/L 130  133  132   Potassium 3.5 - 5.1 mmol/L 5.5  4.4  4.5   Chloride 98 - 111 mmol/L 98  99  103   CO2 22 - 32 mmol/L '17  18  18   '$ Calcium 8.9 - 10.3 mg/dL 7.8  8.1  7.7   Total Protein 6.5 - 8.1 g/dL  5.2    Total Bilirubin 0.3 - 1.2 mg/dL  0.4    Alkaline Phos 38 - 126 U/L  540  AST 15 - 41 U/L  53    ALT 0 - 44 U/L  89     Last 3 CBGs 215, 196, 170   Assessment/Plan:   Patient Summary: Alicia [name] is a 71 y.o. with a pertinent PMH of HFpEF, HTN, DMII, CKD4, PAD, prior CVA, who presented with progressive weakness and post-prandial abdominal pain and admitted for decreased functional ability, diffuse abdominal pain, and chronic diarrhea likely multifactorial in etiology due to pancreatic insufficiency, duodenal ulcers.  We are trying everything we can to improve her p.o. intake and encouraged her to mobilize.  We will need to see significant progress in the next couple days or consider PEG tube, which would be very unfortunate in this patient who does not seem to have any clear structural indication for it.  #Abdominal Pain of Unkown Etiology #Chronic diarrhea #IMA occlusion #Duodenal ulcers #Pancreatic insufficiency #Lack of PO Intake  #Possible food aversion Continuing medical management, symptoms are well controlled.  Still had minimal p.o. intake yesterday.  Started Zyprexa last night.  Continuing to motivate and encouraged her to improve p.o. intake.  Reaching  the limit of what we can do with medical management and will reach out once again to psychiatry, GI, nephrology today for assistance.  Once again, trying everything we can to improve p.o. intake and remove ND tube so that we can avoid PEG/GJ placement in this patient who does not seem to have any clear structural reason for it. Plan:  - Increase p.o. intake today.  Goal is 2 ensures and 2 meals at least. - Consulted GI today for assessment of any other options of medical management, and so they can be on board if we need to pursue PEG placement down the line - Consult palliative care for assistance with healthcare power of attorney assignment and symptomatic treatment.  - Need to motivate her to work with PT/OT and increase frequency/intensity of attempts if possible given the prolonged nature of her hospital stay and severe deconditioning. - Holding Imodium today due to hyperkalemia and administration of Lokelma. - Continue nighttime tube feeds at 60 mL/h - Crestor 10 for possibile IMA pathology.  -schedule reglan '10mg'$  TID WC  -Continue Zyprexa 2.5 mg nightly.  Consulted psych for further assistance and consideration for ECT due to refractory depression and poor p.o. intake. - Continue Creon for pancreatic insufficiency.  Needs to be taken p.o., cannot be administered through tube. - Continue Protonix 40 mg twice daily, Carafate 1 g every 6 hours -Low concern for refeeding syndrome at this point  #AKI on CKD4 #Hyponatremia #Hyperkalemia Creatinine 3.82 today, BUN 65.  Additionally, hyponatremic at 130 and hyperkalemic at 5.5 today, likely due to worsening kidney function, but also has several other possible precipitating factors.  Reconsulted nephrology due to persistent kidney injury as well as BUN elevation which could be contributing to poor p.o. intake and anorexia/lethargy.  Recommendations pending - Appreciate nephrology for assistance. - Avoid nephrotoxic medications -Restarted LR at 75  mL/h.  One-time dose of Lokelma 10 g.  Holding loperamide for this.  Code: Full Dispo: Pending symptomatic improvement and increased PO intake vs. PEG placement.  Linus Galas MD PGY-1 Internal Medicine Resident Pager: [phone] Please contact the on call pager after 5 pm and on weekends at [phone].

## 2022-09-21 ENCOUNTER — Encounter (HOSPITAL_COMMUNITY)
Admission: EM | Disposition: A | Discharge status: Hospice | Payer: Self-pay | Source: Home / Self Care | Attending: Internal Medicine

## 2022-09-21 ENCOUNTER — Inpatient Hospital Stay (HOSPITAL_COMMUNITY): Payer: Medicare HMO | Admitting: Certified Registered"

## 2022-09-21 ENCOUNTER — Other Ambulatory Visit (HOSPITAL_COMMUNITY): Payer: Medicare HMO

## 2022-09-21 DIAGNOSIS — K449 Diaphragmatic hernia without obstruction or gangrene: Secondary | ICD-10-CM | POA: Diagnosis not present

## 2022-09-21 DIAGNOSIS — N179 Acute kidney failure, unspecified: Secondary | ICD-10-CM | POA: Diagnosis not present

## 2022-09-21 DIAGNOSIS — D649 Anemia, unspecified: Secondary | ICD-10-CM | POA: Diagnosis not present

## 2022-09-21 DIAGNOSIS — K2990 Gastroduodenitis, unspecified, without bleeding: Secondary | ICD-10-CM | POA: Diagnosis not present

## 2022-09-21 DIAGNOSIS — R638 Other symptoms and signs concerning food and fluid intake: Secondary | ICD-10-CM | POA: Diagnosis not present

## 2022-09-21 DIAGNOSIS — R1084 Generalized abdominal pain: Secondary | ICD-10-CM | POA: Diagnosis not present

## 2022-09-21 DIAGNOSIS — E876 Hypokalemia: Secondary | ICD-10-CM | POA: Diagnosis not present

## 2022-09-21 DIAGNOSIS — K269 Duodenal ulcer, unspecified as acute or chronic, without hemorrhage or perforation: Secondary | ICD-10-CM

## 2022-09-21 DIAGNOSIS — G8929 Other chronic pain: Secondary | ICD-10-CM | POA: Diagnosis not present

## 2022-09-21 DIAGNOSIS — N390 Urinary tract infection, site not specified: Secondary | ICD-10-CM | POA: Diagnosis not present

## 2022-09-21 DIAGNOSIS — M1990 Unspecified osteoarthritis, unspecified site: Secondary | ICD-10-CM

## 2022-09-21 DIAGNOSIS — I509 Heart failure, unspecified: Secondary | ICD-10-CM | POA: Diagnosis not present

## 2022-09-21 DIAGNOSIS — E1110 Type 2 diabetes mellitus with ketoacidosis without coma: Secondary | ICD-10-CM | POA: Diagnosis not present

## 2022-09-21 DIAGNOSIS — N184 Chronic kidney disease, stage 4 (severe): Secondary | ICD-10-CM | POA: Diagnosis not present

## 2022-09-21 DIAGNOSIS — R531 Weakness: Secondary | ICD-10-CM | POA: Diagnosis not present

## 2022-09-21 DIAGNOSIS — I130 Hypertensive heart and chronic kidney disease with heart failure and stage 1 through stage 4 chronic kidney disease, or unspecified chronic kidney disease: Secondary | ICD-10-CM | POA: Diagnosis not present

## 2022-09-21 HISTORY — PX: ESOPHAGOGASTRODUODENOSCOPY (EGD) WITH PROPOFOL: SHX5813

## 2022-09-21 LAB — POCT I-STAT, CHEM 8
BUN: 55 mg/dL — ABNORMAL HIGH (ref 8–23)
Calcium, Ion: 1.17 mmol/L (ref 1.15–1.40)
Chloride: 95 mmol/L — ABNORMAL LOW (ref 98–111)
Creatinine, Ser: 3.6 mg/dL — ABNORMAL HIGH (ref 0.44–1.00)
Glucose, Bld: 68 mg/dL — ABNORMAL LOW (ref 70–99)
HCT: 37 % (ref 36.0–46.0)
Hemoglobin: 12.6 g/dL (ref 12.0–15.0)
Potassium: 4.3 mmol/L (ref 3.5–5.1)
Sodium: 130 mmol/L — ABNORMAL LOW (ref 135–145)
TCO2: 20 mmol/L — ABNORMAL LOW (ref 22–32)

## 2022-09-21 LAB — GLUCOSE, CAPILLARY
Glucose-Capillary: 128 mg/dL — ABNORMAL HIGH (ref 70–99)
Glucose-Capillary: 41 mg/dL — CL (ref 70–99)
Glucose-Capillary: 131 mg/dL — ABNORMAL HIGH (ref 70–99)
Glucose-Capillary: 52 mg/dL — ABNORMAL LOW (ref 70–99)
Glucose-Capillary: 81 mg/dL (ref 70–99)
Glucose-Capillary: 104 mg/dL — ABNORMAL HIGH (ref 70–99)
Glucose-Capillary: 136 mg/dL — ABNORMAL HIGH (ref 70–99)
Glucose-Capillary: 56 mg/dL — ABNORMAL LOW (ref 70–99)
Glucose-Capillary: 60 mg/dL — ABNORMAL LOW (ref 70–99)
Glucose-Capillary: 67 mg/dL — ABNORMAL LOW (ref 70–99)
Glucose-Capillary: 95 mg/dL (ref 70–99)

## 2022-09-21 SURGERY — ESOPHAGOGASTRODUODENOSCOPY (EGD) WITH PROPOFOL
Anesthesia: Monitor Anesthesia Care

## 2022-09-21 MED ORDER — AMLODIPINE BESYLATE 10 MG PO TABS
10.0000 mg | ORAL_TABLET | Freq: Every day | ORAL | Status: DC
  Administered 2022-09-24 – 2022-09-29 (×6): 10 mg via ORAL
  Filled 2022-09-21 (×6): qty 1

## 2022-09-21 MED ORDER — DEXTROSE 50 % IV SOLN
INTRAVENOUS | Status: AC
  Filled 2022-09-21: qty 50

## 2022-09-21 MED ORDER — CYPROHEPTADINE HCL 2 MG/5ML PO SYRP
2.0000 mg | ORAL_SOLUTION | Freq: Four times a day (QID) | ORAL | Status: DC
  Administered 2022-09-22 – 2022-10-01 (×30): 2 mg
  Filled 2022-09-21 (×46): qty 5

## 2022-09-21 MED ORDER — PHENYLEPHRINE 80 MCG/ML (10ML) SYRINGE FOR IV PUSH (FOR BLOOD PRESSURE SUPPORT)
PREFILLED_SYRINGE | INTRAVENOUS | Status: DC | PRN
  Administered 2022-09-21: 160 ug via INTRAVENOUS

## 2022-09-21 MED ORDER — METOCLOPRAMIDE HCL 5 MG/5ML PO SOLN
10.0000 mg | Freq: Three times a day (TID) | ORAL | Status: DC
  Administered 2022-09-24 – 2022-09-29 (×18): 10 mg via ORAL
  Filled 2022-09-21 (×26): qty 10

## 2022-09-21 MED ORDER — SUCRALFATE 1 GM/10ML PO SUSP: 1.0000 g | Freq: Two times a day (BID) | ORAL | Status: DC

## 2022-09-21 MED ORDER — DICYCLOMINE HCL 10 MG PO CAPS
10.0000 mg | ORAL_CAPSULE | Freq: Three times a day (TID) | ORAL | Status: DC
  Administered 2022-09-22 – 2022-09-29 (×23): 10 mg via ORAL
  Filled 2022-09-21 (×27): qty 1

## 2022-09-21 MED ORDER — ACETAMINOPHEN 650 MG RE SUPP
650.0000 mg | Freq: Four times a day (QID) | RECTAL | Status: DC
  Filled 2022-09-21: qty 1

## 2022-09-21 MED ORDER — FOLIC ACID 1 MG PO TABS
1.0000 mg | ORAL_TABLET | Freq: Every day | ORAL | Status: DC
  Administered 2022-09-24 – 2022-09-29 (×6): 1 mg via ORAL
  Filled 2022-09-21 (×6): qty 1

## 2022-09-21 MED ORDER — ACETAMINOPHEN 160 MG/5ML PO SOLN
1000.0000 mg | Freq: Four times a day (QID) | ORAL | Status: DC
  Administered 2022-09-24 – 2022-09-29 (×17): 1000 mg via ORAL
  Filled 2022-09-21 (×21): qty 40.6

## 2022-09-21 MED ORDER — PROPOFOL 500 MG/50ML IV EMUL
INTRAVENOUS | Status: DC | PRN
  Administered 2022-09-21: 100 ug/kg/min via INTRAVENOUS

## 2022-09-21 MED ORDER — SODIUM BICARBONATE 650 MG PO TABS
650.0000 mg | ORAL_TABLET | Freq: Two times a day (BID) | ORAL | Status: DC
  Administered 2022-09-24 – 2022-09-28 (×9): 650 mg via ORAL
  Filled 2022-09-21 (×11): qty 1

## 2022-09-21 MED ORDER — PANTOPRAZOLE INFUSION (NEW) - SIMPLE MED
8.0000 mg/h | INTRAVENOUS | Status: AC
  Administered 2022-09-21 – 2022-09-23 (×4): 8 mg/h via INTRAVENOUS
  Filled 2022-09-21 (×5): qty 100

## 2022-09-21 MED ORDER — DEXTROSE IN LACTATED RINGERS 5 % IV SOLN: INTRAVENOUS | Status: DC

## 2022-09-21 MED ORDER — DEXTROSE 50 % IV SOLN
25.0000 g | INTRAVENOUS | Status: AC
  Administered 2022-09-21: 25 g via INTRAVENOUS

## 2022-09-21 MED ORDER — DEXTROSE 50 % IV SOLN
12.5000 g | INTRAVENOUS | Status: AC
  Administered 2022-09-21: 12.5 g via INTRAVENOUS

## 2022-09-21 MED ORDER — SUCRALFATE 1 GM/10ML PO SUSP
1.0000 g | Freq: Two times a day (BID) | ORAL | Status: AC
  Administered 2022-09-24 – 2022-09-28 (×9): 1 g via ORAL
  Filled 2022-09-21 (×11): qty 10

## 2022-09-21 MED ORDER — METOPROLOL TARTRATE 50 MG PO TABS
50.0000 mg | ORAL_TABLET | Freq: Two times a day (BID) | ORAL | Status: DC
  Administered 2022-09-21 – 2022-09-25 (×6): 50 mg via ORAL
  Filled 2022-09-21 (×6): qty 1

## 2022-09-21 MED ORDER — DEXTROSE 50 % IV SOLN
INTRAVENOUS | Status: DC | PRN
  Administered 2022-09-21: 12.5 g via INTRAVENOUS

## 2022-09-21 MED ORDER — PANTOPRAZOLE SODIUM 40 MG IV SOLR
40.0000 mg | Freq: Once | INTRAVENOUS | Status: AC
  Administered 2022-09-21: 40 mg via INTRAVENOUS
  Filled 2022-09-21: qty 10

## 2022-09-21 MED ORDER — DEXTROSE-NACL 5-0.9 % IV SOLN: INTRAVENOUS | Status: DC

## 2022-09-21 MED ORDER — LACTATED RINGERS IV SOLN
INTRAVENOUS | Status: AC | PRN
  Administered 2022-09-21: 1000 mL via INTRAVENOUS

## 2022-09-21 MED ORDER — PANTOPRAZOLE SODIUM 40 MG IV SOLR
40.0000 mg | Freq: Two times a day (BID) | INTRAVENOUS | Status: DC
  Administered 2022-09-23 – 2022-09-25 (×4): 40 mg via INTRAVENOUS
  Filled 2022-09-21 (×4): qty 10

## 2022-09-21 SURGICAL SUPPLY — 15 items

## 2022-09-21 NOTE — Progress Notes (Signed)
Occupational Therapy Treatment Patient Details Name: NEFTALY INZUNZA MRN: [id number] DOB: Mar 25, 1951 Today's Date: 09/21/2022   History of present illness 71 y.o. female presented to ED 10/4 with FTT. Cortrak placed 10/18 PMhx: CHf, HTN, CVA, T2DM, CKD, vit D deficiency, PAD, depression, HFpEF   OT comments  Patient much more alert and talkative this session.  Patient session was up to the chair, but RN informed patient going for EGD.  Focus of treatment was bed mobility, improving to Min A R and Mod A L, and assist with clean up s/p BM.  Able to perform basic HEP to B UE, and patient repositioned for comfort.  OT to continue efforts in the acute setting with SNF recommended for rehab trial to maximize functional status for quality of life.     Recommendations for follow up therapy are one component of a multi-disciplinary discharge planning process, led by the attending physician.  Recommendations may be updated based on patient status, additional functional criteria and insurance authorization.    Follow Up Recommendations  Skilled nursing-short term rehab (<3 hours/day)    Assistance Recommended at Discharge Frequent or constant Supervision/Assistance  Patient can return home with the following  Two people to help with walking and/or transfers;Help with stairs or ramp for entrance;Direct supervision/assist for medications management;Assist for transportation;Assistance with feeding;A lot of help with bathing/dressing/bathroom   Equipment Recommendations  Wheelchair (measurements OT);Wheelchair cushion (measurements OT);BSC/3in1;Tub/shower seat;Hospital bed    Recommendations for Other Services      Precautions / Restrictions Precautions Precautions: Fall Precaution Comments: Skin integrity, incontinent of bowel Restrictions Weight Bearing Restrictions: No       Mobility Bed Mobility Overal bed mobility: Needs Assistance Bed Mobility: Rolling Rolling: Min assist, Mod  assist              Transfers                         Balance                                           ADL either performed or assessed with clinical judgement   ADL                               Toileting- Clothing Manipulation and Hygiene: Total assistance;Bed level              Extremity/Trunk Assessment Upper Extremity Assessment Upper Extremity Assessment: Generalized weakness RUE Deficits / Details: chronic R shoulder dysfunction, difficulty with anything above 90 degrees.  Unable to touch her nose LUE Deficits / Details: Generalized weakness throughout.  Able to touch her nose       Cervical / Trunk Assessment Cervical / Trunk Assessment: Other exceptions Cervical / Trunk Exceptions: body habitus    Vision       Perception     Praxis      Cognition Arousal/Alertness: Awake/alert Behavior During Therapy: WFL for tasks assessed/performed Overall Cognitive Status: Within Functional Limits for tasks assessed                                 General Comments: Much more awake and talkative; wanting to get OOB, but had a EGD scheduled.  Exercises      Shoulder Instructions       General Comments      Pertinent Vitals/ Pain       Pain Assessment Pain Assessment: No/denies pain                                                          Frequency  Min 2X/week        Progress Toward Goals  OT Goals(current goals can now be found in the care plan section)  Progress towards OT goals: Progressing toward goals  Acute Rehab OT Goals OT Goal Formulation: With patient Time For Goal Achievement: 09/28/22 Potential to Achieve Goals: [place] Discharge plan remains appropriate    Co-evaluation    PT/OT/SLP Co-Evaluation/Treatment: Yes Reason for Co-Treatment: Complexity of the patient's impairments (multi-system involvement)   OT goals addressed during  session: ADL's and self-care      AM-PAC OT "6 Clicks" Daily Activity     Outcome Measure   Help from another person eating meals?: A Lot Help from another person taking care of personal grooming?: A Lot Help from another person toileting, which includes using toliet, bedpan, or urinal?: Total Help from another person bathing (including washing, rinsing, drying)?: A Lot Help from another person to put on and taking off regular upper body clothing?: A Lot Help from another person to put on and taking off regular lower body clothing?: Total 6 Click Score: 10    End of Session    OT Visit Diagnosis: Unsteadiness on feet (R26.81);Muscle weakness (generalized) (M62.81);History of falling (Z91.81)   Activity Tolerance Patient tolerated treatment well   Patient Left in bed;with call bell/phone within reach;with nursing/sitter in room   Nurse Communication          Time: 1037-1100 OT Time Calculation (min): 23 min  Charges: OT General Charges $OT Visit: 1 Visit OT Treatments $Self Care/Home Management : 8-22 mins  09/21/2022  RP, OTR/L  Acute Rehabilitation Services  Office:  [phone]   Metta Clines 09/21/2022, 11:09 AM

## 2022-09-21 NOTE — Progress Notes (Addendum)
Subjective:   Summary: Alicia [name] is a 71 y.o. year old female currently admitted on the IMTS HD#28 for diffuse abdominal pain as well as severely decreased PO intake likely multifactorial in etiology due to pancreatic insufficiency, chronic IMA occlusion, duodenal ulcers.  Overnight Events:  -Had 1 episode of hypoglycemia overnight and one this morning.  Tube feeds have been held in anticipation of EGD this morning.  Treated with D50 with adequate response - Reassessed by multiple consultants yesterday. Recommendations in assessment.  Of note, EGD scheduled this morning with GI. - Symptoms continue to be well managed.  She does not seem to have any significantly improved p.o. intake. Objective:  Vital signs in last 24 hours: Vitals:   09/20/22 2130 09/20/22 2343 09/21/22 0300 09/21/22 0437  BP: 126/74 (!) 119/59  129/64  Pulse: 70 76  67  Resp: '16 18 12 16  '$ Temp: 98 F (36.7 C) 98.4 F (36.9 C)  97.6 F (36.4 C)  TempSrc: Oral Oral  Oral  SpO2: 98%   92%  Weight:   117.5 kg   Height:       Supplemental O2: Room Air    Physical Exam:  Constitutional: NAD HEENT: Feeding tube in place Abdominal: minimal diffuse TTP, no guarding or rebound.  Distended but soft.  Periumbilical hernia unchanged.  Extremities: 2+ pitting edema of lower extremities bilaterally Neuro/Psych: Flat affect today   Intake/Output Summary (Last 24 hours) at 09/21/2022 0720 Last data filed at 09/21/2022 8588 Gross per 24 hour  Intake 1267.9 ml  Output 350 ml  Net 917.9 ml    Net IO Since Admission: 26,451.2 mL [09/21/22 0720]  Pertinent Labs: Unable to obtain labs today after multiple attempts    Latest Ref Rng & Units 09/19/2022    7:38 AM 09/17/2022   10:57 AM 09/16/2022   10:46 AM  CBC  WBC 4.0 - 10.5 K/uL 9.1  9.4  9.1   Hemoglobin 12.0 - 15.0 g/dL 11.4  10.3  10.4   Hematocrit 36.0 - 46.0 % 34.1  29.2  30.0   Platelets 150 - 400 K/uL 330  259  269         Latest Ref Rng & Units 09/20/2022    4:43 AM 09/19/2022    7:38 AM 09/18/2022    3:38 AM  CMP  Glucose 70 - 99 mg/dL 217  215  103   BUN 8 - 23 mg/dL 65  61  57   Creatinine 0.44 - 1.00 mg/dL 3.82  3.63  3.42   Sodium 135 - 145 mmol/L 130  133  132   Potassium 3.5 - 5.1 mmol/L 5.5  4.4  4.5   Chloride 98 - 111 mmol/L 98  99  103   CO2 22 - 32 mmol/L '17  18  18   '$ Calcium 8.9 - 10.3 mg/dL 7.8  8.1  7.7   Total Protein 6.5 - 8.1 g/dL  5.2    Total Bilirubin 0.3 - 1.2 mg/dL  0.4    Alkaline Phos 38 - 126 U/L  540    AST 15 - 41 U/L  53    ALT 0 - 44 U/L  89     Last 3 CBGs 128, 41, 78 GI panel, C. difficile pending Phos 3.6 Lipase 48 GGT 368  Assessment/Plan:   Patient Summary: Alicia [name] is a 71 y.o. with a pertinent  PMH of HFpEF, HTN, DMII, CKD4, PAD, prior CVA, who presented with progressive weakness and post-prandial abdominal pain and admitted for decreased functional ability, diffuse abdominal pain, and chronic diarrhea likely multifactorial in etiology due to pancreatic insufficiency, duodenal ulcers.  Reassessed with multiple consultants yesterday, following recommendations today.  Trying everything we can to encourage p.o. intake, but with limited results.  #Abdominal Pain of Unkown Etiology #Chronic diarrhea #IMA occlusion #Duodenal ulcers #Pancreatic insufficiency #Lack of PO Intake  #Possible food aversion Continuing medical management, symptoms are well controlled.  Reassessed with multiple consultants yesterday including psychiatry, GI, nephrology.  See full recommendations in their documentation.  Briefly, psychiatry recommended continuing Zyprexa and possibly adding on Periactin as a possible adjunctive appetite stimulant.  Nephrology agreed with continued IV fluids and palliative consult.  GI recommended multiple labs/stool studies and CT scan with oral contrast, EGD today.  They also mention concern for congestive hepatopathy, though uncertain about  appropriate management for this given with her likely prolonged prerenal AKI and her now meeting criteria for CKD 5, fluids might still be necessary.  GGT was elevated, lipase within normal limits, stool studies/C. difficile pending, CT results remarkable for small bilateral pleural effusions, diffuse body wall edema, diverticulosis without diverticulitis.  Primary goal for all of these diagnostics would be to elucidate any additional conditions that could be medically managed make it easier for [name] to take in p.o. intake.  Otherwise, would have to consider PEG tube as previously stated. Plan:  - Increase p.o. intake today.  Unfortunately, patient has to be n.p.o. until this afternoon. - Follow-up GI recommendations.  Stool studies/C. difficile, EGD results - Need to balance concern for worsening diffuse edema/congestive hepatopathy versus worsening CKD/need for maintenance fluids while tube feeds are stopped to support blood glucose levels. - Follow-up palliative recs.  - Need to motivate her to work with PT/OT and increase frequency/intensity of attempts if possible given the prolonged nature of her hospital stay and severe deconditioning. - Holding Imodium today due to hyperkalemia and administration of Lokelma. - After EGD, continue nighttime tube feeds at 60 mL/h - Crestor 10 for possibile IMA pathology.  - schedule reglan '10mg'$  TID WC  - Continue Zyprexa 2.5 mg nightly.  Psychiatry recommended continuing Zyprexa and possibly adding on Periactin as adjunct of appetite stimulant.  We will start Periactin 2 mg 4 times daily today for 1 week and then uptitrate dose to 4 mg 4 times daily.   - Continue Creon for pancreatic insufficiency.  Needs to be taken p.o., cannot be administered through tube. - Continue Protonix 40 mg twice daily, Carafate 1 g every 6 hours.  Carafate as can be stopped in the setting of worsening CKD if EGD results are improved - Low concern for refeeding syndrome at this  point  #AKI on CKD4 #Hyponatremia #Hyperkalemia BMP was not drawn this morning.  Per nephrology, continue IV fluid support for likely prolonged prerenal AKI.  Need to balance fluids with seemingly diffuse edema. - Appreciate nephrology for assistance. - Avoid nephrotoxic medications -D5LR while she is n.p.o., will likely stop fluids after. - Need to obtain BMP today.  Code: Full Dispo: Pending symptomatic improvement and increased PO intake vs. PEG placement.  Linus Galas MD PGY-1 Internal Medicine Resident Pager: [phone] Please contact the on call pager after 5 pm and on weekends at [phone].

## 2022-09-21 NOTE — Progress Notes (Signed)
Patient VH:QIONGEXBMW D [place]      DOB: 10-Nov-1951      UXL:[id number]      Palliative Medicine Team    Subjective: Bedside symptom check completed. No family or visitors present.    Physical exam: Patient resting in bed with eyes closed at time of visit. Breathing even and non-labored, no excessive secretions noted. Patient without physical or non-verbal signs of pain or discomfort at this time. This RN did not attempt to wake patient.   Addendum: 15:20 This RN returned bedside, no family or visitors present.   Assessment and plan: Plan for family meeting tomorrow with NP as able. RN available to provide support. Bedside RN without needs or concerns at this time. Will continue to follow for any changes or advances.     Thank you for allowing the Palliative Medicine Team to assist in the care of this patient.     Damian Leavell, MSN, [place] Palliative Medicine Team Team Phone: [phone]  This phone is monitored 7a-7p, please reach out to attending physician outside of these hours for urgent needs.

## 2022-09-21 NOTE — Progress Notes (Signed)
Physical Therapy Treatment Patient Details Name: Alicia [name] MRN: [id number] DOB: 04-28-1951 Today's Date: 09/21/2022   History of Present Illness 71 y.o. female presented to ED 10/4 with FTT. Cortrak placed 10/18 PMhx: CHf, HTN, CVA, T2DM, CKD, vit D deficiency, PAD, depression, HFpEF    PT Comments    More awake and alert this session. Plan for session was to get OOB to chair via [place] but RN informed of patient going for EGD and need for foley placement prior to attempt. Able to roll in bed to R with minA and to L with modA for pericare due to bowel incontinence. Instructed patient on simple HEP with quad sets and heel slides while in bed to promote strengthening. Continue to recommend SNF for ongoing Physical Therapy.       Recommendations for follow up therapy are one component of a multi-disciplinary discharge planning process, led by the attending physician.  Recommendations may be updated based on patient status, additional functional criteria and insurance authorization.  Follow Up Recommendations  Skilled nursing-short term rehab (<3 hours/day) Can patient physically be transported by private vehicle: No   Assistance Recommended at Discharge Frequent or constant Supervision/Assistance  Patient can return home with the following Assistance with cooking/housework;Direct supervision/assist for medications management;Assist for transportation;A lot of help with bathing/dressing/bathroom;Two people to help with walking and/or transfers;Help with stairs or ramp for entrance   Equipment Recommendations  Wheelchair (measurements PT);Wheelchair cushion (measurements PT);Hospital bed;Other (comment) (hoyer lift)    Recommendations for Other Services Other (comment) (Palliative Consult)     Precautions / Restrictions Precautions Precautions: Fall Precaution Comments: Skin integrity, incontinent of bowel Restrictions Weight Bearing Restrictions: No     Mobility  Bed  Mobility Overal bed mobility: Needs Assistance Bed Mobility: Rolling Rolling: Min assist, Mod assist         General bed mobility comments: able to roll towards R side with minA and towards L with modA to perform pericare due to bowel incontinence    Transfers                   General transfer comment: deferred due to RN needing to place foley prior to EGD    Ambulation/Gait                   Stairs             Wheelchair Mobility    Modified Rankin (Stroke Patients Only)       Balance                                            Cognition Arousal/Alertness: Awake/alert Behavior During Therapy: WFL for tasks assessed/performed Overall Cognitive Status: Within Functional Limits for tasks assessed                                 General Comments: Much more awake and talkative; wanting to get OOB, but had a EGD scheduled.        Exercises General Exercises - Lower Extremity Quad Sets: AROM, Both, 5 reps Heel Slides: AAROM, Both, 5 reps    General Comments        Pertinent Vitals/Pain Pain Assessment Pain Assessment: No/denies pain    Home Living  Prior Function            PT Goals (current goals can now be found in the care plan section) Acute Rehab PT Goals PT Goal Formulation: With patient Time For Goal Achievement: 09/29/22 Potential to Achieve Goals: Good Progress towards PT goals: Progressing toward goals    Frequency    Min 2X/week      PT Plan Current plan remains appropriate    Co-evaluation   Reason for Co-Treatment: Complexity of the patient's impairments (multi-system involvement)   OT goals addressed during session: ADL's and self-care      AM-PAC PT "6 Clicks" Mobility   Outcome Measure  Help needed turning from your back to your side while in a flat bed without using bedrails?: A Little Help needed moving from lying on your back  to sitting on the side of a flat bed without using bedrails?: Total Help needed moving to and from a bed to a chair (including a wheelchair)?: Total Help needed standing up from a chair using your arms (e.g., wheelchair or bedside chair)?: Total Help needed to walk in hospital room?: Total Help needed climbing 3-5 steps with a railing? : Total 6 Click Score: 8    End of Session   Activity Tolerance: Patient tolerated treatment well Patient left: in bed;with call bell/phone within reach;with nursing/sitter in room Nurse Communication: Mobility status;Need for lift equipment PT Visit Diagnosis: Other abnormalities of gait and mobility (R26.89);History of falling (Z91.81);Muscle weakness (generalized) (M62.81)     Time: 1037-1100 PT Time Calculation (min) (ACUTE ONLY): 23 min  Charges:  $Therapeutic Activity: 8-22 mins                     Kinnley Paulson A. Gilford Rile PT, DPT Acute Rehabilitation Services Office [phone]    Linna Hoff 09/21/2022, 12:41 PM

## 2022-09-21 NOTE — Anesthesia Preprocedure Evaluation (Signed)
Anesthesia Evaluation  Patient identified by MRN, date of birth, ID band Patient awake    Reviewed: Allergy & Precautions, NPO status , Patient's Chart, lab work & pertinent test results  Airway Mallampati: III  TM Distance: >3 FB Neck ROM: Full    Dental  (+) Edentulous Upper, Edentulous Lower   Pulmonary shortness of breath and Long-Term Oxygen Therapy, Current Smoker and Patient abstained from smoking.,    Pulmonary exam normal        Cardiovascular hypertension, Pt. on medications and Pt. on home beta blockers + Peripheral Vascular Disease and +CHF  Normal cardiovascular exam     Neuro/Psych  Headaches, PSYCHIATRIC DISORDERS Depression CVA    GI/Hepatic Neg liver ROS, PUD, GERD  Medicated and Controlled,  Endo/Other  diabetes, Oral Hypoglycemic AgentsMorbid obesity  Renal/GU Renal disease     Musculoskeletal  (+) Arthritis ,   Abdominal   Peds  Hematology  (+) Blood dyscrasia, anemia ,   Anesthesia Other Findings abdominal pain, nausea  Reproductive/Obstetrics                             Anesthesia Physical Anesthesia Plan  ASA: 4  Anesthesia Plan: MAC   Post-op Pain Management:    Induction: Intravenous  PONV Risk Score and Plan: 1 and Propofol infusion and Treatment may vary due to age or medical condition  Airway Management Planned: Simple Face Mask  Additional Equipment:   Intra-op Plan:   Post-operative Plan:   Informed Consent: I have reviewed the patients History and Physical, chart, labs and discussed the procedure including the risks, benefits and alternatives for the proposed anesthesia with the patient or authorized representative who has indicated his/her understanding and acceptance.       Plan Discussed with: CRNA  Anesthesia Plan Comments:         Anesthesia Quick Evaluation

## 2022-09-21 NOTE — Anesthesia Postprocedure Evaluation (Signed)
Anesthesia Post Note  Patient: Alicia [name]  Procedure(s) Performed: ESOPHAGOGASTRODUODENOSCOPY (EGD) WITH PROPOFOL     Patient location during evaluation: Endoscopy Anesthesia Type: MAC Level of consciousness: awake Pain management: pain level controlled Vital Signs Assessment: post-procedure vital signs reviewed and stable Respiratory status: spontaneous breathing, nonlabored ventilation, respiratory function stable and patient connected to nasal cannula oxygen Cardiovascular status: stable and blood pressure returned to baseline Postop Assessment: no apparent nausea or vomiting Anesthetic complications: no   No notable events documented.  Last Vitals:  Vitals:   09/21/22 1415 09/21/22 1440  BP: (!) 126/94 112/74  Pulse: 70 97  Resp: 20 18  Temp: 36.4 C 36.4 C  SpO2: 98% 91%    Last Pain:  Vitals:   09/21/22 1440  TempSrc: Oral  PainSc:                  Aliese Brannum P Beldon Nowling

## 2022-09-21 NOTE — Progress Notes (Signed)
Calorie Count Note: Day 2 Results  Calorie count ordered and was started on 10/30 at breakfast meal. Please see day 2 results below.  Diet: regular, thin liquids Supplements:  - Ensure Enlive po TID, each supplement provides 350 kcal and 20 grams of protein  09/20/22: Breakfast: 0 kcal, 0 grams of protein Lunch: 0 kcal, 0 grams of protein Dinner: 0 kcal, 0 grams of protein Supplements: 85 kcal, 5 grams of protein (3 sips of Ensure)  Day 2 total intake: 85 kcal (5% of minimum estimated needs)  5 grams of protein (6% of minimum estimated needs)  Nutrition Diagnosis: Inadequate oral intake related to inability to eat, nausea as evidenced by meal completion < 50%.  Goal: Patient will meet greater than or equal to 90% of their needs.  Intervention:  - d/c calorie count - Continue to offer Ensure Enlive TID - Encourage PO intake and provide feeding assistance as needed - Continue nocturnal tube feeds of Osmolite 1.5 formula at rate of 70 ml/hr over 14 hours daily (1800 to 0800) to provide 1470 kcal, 61 grams of protein, and 747 ml of free water. This meets 86% of minimum kcal needs and 76% of minimum protein needs.   Gustavus Bryant, MS, RD, LDN Inpatient Clinical Dietitian Please see [facility] for contact information.

## 2022-09-21 NOTE — Progress Notes (Signed)
Hypoglycemic Event  CBG:   Latest Reference Range & Units 09/21/22 20:09  Glucose-Capillary 70 - 99 mg/dL 60 (L)  (L): Data is abnormally low  Treatment: D50 25 mL (12.5 gm)  Symptoms: None  Follow-up CBG Result:  Latest Reference Range & Units 09/21/22 20:59  Glucose-Capillary 70 - 99 mg/dL 67 (L)  (L): Data is abnormally low  Possible Reasons for Event: Inadequate meal intake  Comments/MD notified: Notified Drucie Opitz, MD  Per MD, administered another dose of D50 25 mL (12.5 gm)  Follow-up CBG:   Latest Reference Range & Units 09/21/22 22:12  Glucose-Capillary 70 - 99 mg/dL 95     Sharmon Revere, RN

## 2022-09-21 NOTE — Op Note (Signed)
[facility] Patient Name: Alicia [name] Procedure Date : 09/21/2022 MRN: [id number] Attending MD: Jackquline Denmark , MD, [id number] Date of Birth: 07/19/1951 CSN: [id number] Age: 71 Admit Type: Inpatient Procedure:                Upper GI endoscopy Indications:              Generalized abdominal pain, failure to thrive.                            Recent duodenal ulcers. Providers:                Jackquline Denmark, MD, Jeanella Cara, RN,                            Benetta Spar, Technician Referring MD:              Medicines:                Monitored Anesthesia Care Complications:            No immediate complications. Estimated Blood Loss:     Estimated blood loss: none. Procedure:                Pre-Anesthesia Assessment:                           - Prior to the procedure, a History and Physical                            was performed, and patient medications and                            allergies were reviewed. The patient's tolerance of                            previous anesthesia was also reviewed. The risks                            and benefits of the procedure and the sedation                            options and risks were discussed with the patient.                            All questions were answered, and informed consent                            was obtained. Prior Anticoagulants: The patient has                            taken no anticoagulant or antiplatelet agents. ASA                            Grade Assessment: IV - A patient with severe  systemic disease that is a constant threat to life.                            After reviewing the risks and benefits, the patient                            was deemed in satisfactory condition to undergo the                            procedure.                           After obtaining informed consent, the endoscope was                            passed under direct  vision. Throughout the                            procedure, the patient's blood pressure, pulse, and                            oxygen saturations were monitored continuously. The                            [id number]-[id number] ([id number] Olympus endoscope was introduced                            through the mouth, and advanced to the second part                            of duodenum. The upper GI endoscopy was                            accomplished without difficulty. The patient                            tolerated the procedure well. Scope In: Scope Out: Findings:      The examined esophagus was normal with well-defined Z-line at 37 cm.      A 2 cm hiatal hernia was present.      Localized mild inflammation characterized by erythema and nodularity was       found in the gastric antrum. Biopsies were taken with a cold forceps for       histology to r/o H.P (prev Bx were neg)      Two moderate non-bleeding cratered deep duodenal ulcers with no stigmata       of bleeding were found in the distal duodenal bulb and in the first       portion of the duodenum. These were approximately 10 mm and 15 mm in       largest dimension. Similar to previous appearance. 3 more smaller ulcers       were noted in the duodenal bulb as well. There was moderate surrounding       erythema. No outlet obstruction. The second portion of the duodenum was       normal. Impression:               -  Small hiatal hernia.                           - Significant duodenal ulcers with duodenitis. No                            high risk stigmata for bleeding. No outlet                            obstruction                           - Gastritis. Biopsied. Recommendation:           - Full liquid diet only x 48hrs, then advance to                            gastroparesis diet.                           - Give Protonix (pantoprazole): 8 mg/hr IV by                            continuous infusion x 48hrs, then '40mg'$  IV BID.                            - Decrease Carafate elixir 1g po BID (d/t CKD) x 7                            days.                           - Replace coretrack tube. Continue nocturnal feeds.                           - Trend LFTs                           - ?Consult cardiology for dCHF. 2DE - pending.                           - Avoid nonsteroidals.                           I have reviewed CT Abdo/pelvis which showed diffuse                            anasarca, B/L pleural effusions. Procedure Code(s):        --- Professional ---                           [phone], Esophagogastroduodenoscopy, flexible,                            transoral; with biopsy, single or multiple Diagnosis Code(s):        --- Professional ---  K44.9, Diaphragmatic hernia without obstruction or                            gangrene                           K29.70, Gastritis, unspecified, without bleeding                           K26.9, Duodenal ulcer, unspecified as acute or                            chronic, without hemorrhage or perforation                           R10.84, Generalized abdominal pain CPT copyright 2022 [employer]. All rights reserved. The codes documented in this report are preliminary and upon coder review may  be revised to meet current compliance requirements. Jackquline Denmark, MD 09/21/2022 2:17:38 PM This report has been signed electronically. Number of Addenda: 0

## 2022-09-21 NOTE — Progress Notes (Signed)
Hypoglycemic Event  CBG:   Latest Reference Range & Units 09/21/22 04:39  Glucose-Capillary 70 - 99 mg/dL 41 (LL)  (LL): Data is critically low  Treatment: D50 25 mL (12.5 gm)  Symptoms: None  Follow-up CBG: Time:  CBG Result:  Latest Reference Range & Units 09/21/22 05:03  Glucose-Capillary 70 - 99 mg/dL 128 (H)  (H): Data is abnormally high  Possible Reasons for Event: Inadequate meal intake  Comments/MD notified:    Viviano Simas

## 2022-09-21 NOTE — Transfer of Care (Signed)
Immediate Anesthesia Transfer of Care Note  Patient: Alicia [name]  Procedure(s) Performed: ESOPHAGOGASTRODUODENOSCOPY (EGD) WITH PROPOFOL  Patient Location: PACU  Anesthesia Type:MAC  Level of Consciousness: awake, alert , and oriented  Airway & Oxygen Therapy: Patient Spontanous Breathing and Patient connected to nasal cannula oxygen  Post-op Assessment: Report given to RN and Post -op Vital signs reviewed and stable  Post vital signs: Reviewed and stable  Last Vitals:  Vitals Value Taken Time  BP 121/80 09/21/22 1400  Temp    Pulse 70 09/21/22 1403  Resp 20 09/21/22 1403  SpO2 98 % 09/21/22 1403  Vitals shown include unvalidated device data.  Last Pain:  Vitals:   09/21/22 1245  TempSrc: Temporal  PainSc: 0-No pain      Patients Stated Pain Goal: 0 (83/66/29 4765)  Complications: No notable events documented.

## 2022-09-21 NOTE — Progress Notes (Signed)
[facility] Progress Note   Assessment/ Plan:   AKI on CKD 4: BL possibly 2.5-3 but fluctuates. Previously was followed by Dr Joylene Grapes  - main issue looks like no Po intake--> now with urinary retention on top of everything  - stop IVFs with pleural effusions  - Foley in place now  - dont' expect this BUN to cause uremia- no indication for RRT  - dialysis would not be of utility in this setting- she appears profoundly depressed and if she's not eating dialysis essentially prolongs death by starvation.    - Zyprexa replaced remeron recently  - needs palliative care c/s, appreciate assistance  - only 30 mg protein on last UA, will send UP/C to be sure  Depression:  - Zyprexa  - already tried remeron without much improvement  Abd pain: unknown etiology. NG tube in place. Extensive workup.  - endoscopy today--> if we can get off carafate would be helpful in setting of CKD  Elevated LFTs:  - per GI  - reorder TTE    HTN: BP slightly high. Cont current meds  Subjective:    Seen in room.  Had CT scan yesterday evening showing some bilateral pleural effusions and markedly distended bladder.  Foley placed with immediate return of 600 mL urine.  For endo today.  Difficult stick- hard to get AM labs.    Objective:   BP 123/72   Pulse 69   Temp 98.6 F (37 C) (Temporal)   Resp 12   Ht '5\' 8"'$  (1.727 m)   Wt 117.5 kg   SpO2 100%   BMI 39.38 kg/m   Intake/Output Summary (Last 24 hours) at 09/21/2022 1305 Last data filed at 09/21/2022 1200 Gross per 24 hour  Intake 1267.9 ml  Output 750 ml  Net 517.9 ml   Weight change: 0 kg  Physical Exam: ZHG:DJME affect, lygin in bed CVS:RRR Resp: clear QAS:TMHD Ext: 1+ LE edema  Imaging: CT ABDOMEN PELVIS WO CONTRAST  Result Date: 09/20/2022 CLINICAL DATA:  Chronic abdominal pain. EXAM: CT ABDOMEN AND PELVIS WITHOUT CONTRAST TECHNIQUE: Multidetector CT imaging of the abdomen and pelvis was performed following the  standard protocol without IV contrast. RADIATION DOSE REDUCTION: This exam was performed according to the departmental dose-optimization program which includes automated exposure control, adjustment of the mA and/or kV according to patient size and/or use of iterative reconstruction technique. COMPARISON:  Limited abdominal ultrasound 09/15/2022. CT abdomen and pelvis 08/24/2022. FINDINGS: Lower chest: There are new small bilateral pleural effusions with atelectasis in the bilateral lung bases. Hepatobiliary: No focal liver abnormality is seen. No gallstones, gallbladder wall thickening, or biliary dilatation. Pancreas: Unremarkable. No pancreatic ductal dilatation or surrounding inflammatory changes. Spleen: Normal in size without focal abnormality. Adrenals/Urinary Tract: Left adrenal thickening is nonspecific and unchanged. Right adrenal gland within normal limits. There is no hydronephrosis. Prominent vascular calcifications are seen in the kidneys. No ureteral or bladder calculi are seen. The bladder is markedly distended. Stomach/Bowel: Stomach is within normal limits. Appendix appears normal. No evidence of bowel wall thickening, distention, or inflammatory changes. There is sigmoid colon diverticulosis without evidence for acute diverticulitis. Enteric tube tip is in the proximal jejunum. Vascular/Lymphatic: Aortic atherosclerosis. No enlarged abdominal or pelvic lymph nodes. Reproductive: Focal rounded hypodense area seen in the lower uterine segment measuring 1.9 cm, indeterminate and unchanged. Otherwise, uterus and adnexa are within normal limits. Other: There is no ascites or free air. There is diffuse body wall edema which has increased from prior. Moderate-sized  fat containing umbilical hernia appears unchanged from the prior examination. There is some fluid in the hernia sac as well, also unchanged. Musculoskeletal: Degenerative changes affect the spine. IMPRESSION: 1. New small bilateral pleural  effusions with bibasilar atelectasis. 2. Diffuse body wall edema has increased from prior. 3. Marked distention of the bladder. 4. Stable fat containing umbilical hernia with fluid in the hernia sac. 5. Stable indeterminate hypodense area in the lower uterine segment. This can be further evaluated with pelvic ultrasound. 6. Sigmoid colon diverticulosis without evidence for acute diverticulitis. 7. Enteric tube tip is in the proximal jejunum. Aortic Atherosclerosis (ICD10-I70.0). Electronically Signed   By: Ronney Asters M.D.   On: 09/20/2022 21:05    Labs: BMET Recent Labs  Lab 09/16/22 1046 09/17/22 1057 09/18/22 0338 09/19/22 0738 09/20/22 0443 09/20/22 1012  NA 134* 129* 132* 133* 130*  --   K 4.5 6.0* 4.5 4.4 5.5*  --   CL 104 99 103 99 98  --   CO2 17* 19* 18* 18* 17*  --   GLUCOSE 90 137* 103* 215* 217*  --   BUN 55* 56* 57* 61* 65*  --   CREATININE 3.53* 3.53* 3.42* 3.63* 3.82*  --   CALCIUM 7.7* 7.6* 7.7* 8.1* 7.8*  --   PHOS 2.9  --   --   --   --  3.6   CBC Recent Labs  Lab 09/16/22 1046 09/17/22 1057 09/19/22 0738  WBC 9.1 9.4 9.1  HGB 10.4* 10.3* 11.4*  HCT 30.0* 29.2* 34.1*  MCV 92.0 92.4 95.8  PLT 269 259 330    Medications:     [MAR Hold] acetaminophen  650 mg Rectal Q6H   Or   [MAR Hold] acetaminophen (TYLENOL) oral liquid 160 mg/5 mL  1,000 mg Per Tube Q6H   [MAR Hold] amLODipine  10 mg Per Tube Daily   [MAR Hold] cyproheptadine  2 mg Per Tube QID   dextrose       [MAR Hold] dicyclomine  10 mg Per Tube TID AC & HS   [MAR Hold] DULoxetine  30 mg Oral BID   [MAR Hold] enoxaparin (LOVENOX) injection  30 mg Subcutaneous Q24H   [MAR Hold] feeding supplement  237 mL Oral TID BM   [MAR Hold] feeding supplement (OSMOLITE 1.5 CAL)  1,000 mL Per Tube [id number]   [MAR Hold] folic acid  1 mg Per Tube Daily   [MAR Hold] free water  200 mL Per Tube Q4H   [MAR Hold] insulin aspart  0-5 Units Subcutaneous Q4H   [MAR Hold] insulin glargine-yfgn  6 Units Subcutaneous QHS    [MAR Hold] lipase/protease/amylase  24,000 Units Oral TID   [MAR Hold] metoCLOPramide  10 mg Per Tube TID AC   [MAR Hold] metoprolol tartrate  50 mg Per Tube BID   [MAR Hold] multivitamin  1 tablet Per Tube QHS   [MAR Hold] OLANZapine zydis  2.5 mg Oral QHS   [MAR Hold] mouth rinse  15 mL Mouth Rinse 4 times per day   [MAR Hold] pantoprazole (PROTONIX) IV  40 mg Intravenous Q12H   [MAR Hold] rosuvastatin  10 mg Per Tube QPM   [MAR Hold] sodium bicarbonate  650 mg Per Tube BID   [MAR Hold] sodium zirconium cyclosilicate  10 g Oral Daily   [MAR Hold] sucralfate  1 g Per Tube Q6H   [MAR Hold] thiamine  100 mg Per Tube Daily    Madelon Lips MD 09/21/2022, 1:05 PM

## 2022-09-21 NOTE — Consult Note (Signed)
Face-to-Face Psychiatry Consult   Reason for Consult:  "Patient previously assessed by psych, now hopspital day 27. Multiple GI conditions which are maximally medically managed. Has significant food aversion from these chronic conditions and also appears significantly depressed. Trying to start eating again now. Have tried remeron and now zyprexa to improve appetite. Is she a candidate for ECT due to refractory depression and poor PO intake, or are there any other possible options to encourage appetite?" Referring Physician:  S. [place]  Patient Identification: Alicia [name] MRN:  [id number] Principal Diagnosis: Chronic generalized abdominal pain Diagnosis:  Principal Problem:   Chronic generalized abdominal pain Active Problems:   Acute worsening of stage 4 chronic kidney disease (HCC)   Generalized weakness   Pressure injury of skin   Nausea and vomiting   Diarrhea   Duodenal ulcer   Mucosal abnormality of stomach   Mucosal abnormality of esophagus   Food aversion   Hypoalbuminemia due to protein-calorie malnutrition ([place])  Summary: Alicia [name] is a 71 y.o. year old female currently admitted on the IMTS HD#26 for diffuse abdominal pain of unknown etiology, as well as severely decreased PO intake.   Total Time spent with patient: 30 minutes  Subjective:   Seen and assessed at bedside. Patient has not been drinking ensure per chart review. She admits this and says that she is trying. She states that she simply has no appetite and no desire to eat. She reports experiencing "some" depression recently but says this is due to her not walking. It does not appear that she is not eating due to her depression.  Denies present SI/HI/AVH  Past Psychiatric History:  Denies previous psych dx Denies previous psych hosp or suicide attempt Denies current home psych meds PTA Reports taking an unknown psych med for a few weeks after head trauma many years ago  Denies family psych  history known to her. Reports niece attempted suicide.   Risk to Self:  denies Risk to Others:  denies Prior Inpatient Therapy:  denies Prior Outpatient Therapy:  denies  Past Medical History:  Past Medical History:  Diagnosis Date   Allergy    Arthritis    Chronic headaches    Constipation    Depression    Diabetes mellitus    GERD (gastroesophageal reflux disease)    Hyperlipidemia    Hypertension    Obesity    PAD (peripheral artery disease) ([place])    stage 4    Stroke [facility])     Past Surgical History:  Procedure Laterality Date   ABDOMINAL AORTOGRAM W/LOWER EXTREMITY N/A 12/17/2018   Procedure: ABDOMINAL AORTOGRAM W/LOWER EXTREMITY;  Surgeon: Waynetta Sandy, MD;  Location: [facility];  Service: Cardiovascular;  Laterality: N/A;   ARTERY BIOPSY Left 03/17/2022   Procedure: BIOPSY TEMPORAL ARTERY;  Surgeon: Cherre Robins, MD;  Location: [facility];  Service: Vascular;  Laterality: Left;   BIOPSY  09/01/2022   Procedure: BIOPSY;  Surgeon: Yetta Flock, MD;  Location: [facility];  Service: Gastroenterology;;   ESOPHAGOGASTRODUODENOSCOPY (EGD) WITH PROPOFOL N/A 09/01/2022   Procedure: ESOPHAGOGASTRODUODENOSCOPY (EGD) WITH PROPOFOL;  Surgeon: Yetta Flock, MD;  Location: [facility];  Service: Gastroenterology;  Laterality: N/A;   FEMORAL BYPASS  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 Bridgett Larsson   PERIPHERAL VASCULAR ATHERECTOMY Left 12/17/2018   Procedure: PERIPHERAL VASCULAR ATHERECTOMY;  Surgeon: Waynetta Sandy, MD;  Location: [facility];  Service: Cardiovascular;  Laterality: Left;   PERIPHERAL VASCULAR BALLOON ANGIOPLASTY Left 12/17/2018   Procedure: PERIPHERAL VASCULAR BALLOON ANGIOPLASTY;  Surgeon: Waynetta Sandy, MD;  Location: [facility];  Service: Cardiovascular;  Laterality: Left;   Family History:  Family History  Problem Relation Age of Onset   Diabetes Father    Heart  disease Mother        NOT before age 60-  Bypass   Hypertension Mother    Hypertension Sister    Varicose Veins Brother    Heart disease Brother        Before age 22   Hypertension Daughter    Colon cancer Neg Hx    Esophageal cancer Neg Hx    Rectal cancer Neg Hx    Stomach cancer Neg Hx     Social History:  Social History   Substance and Sexual Activity  Alcohol Use Yes   Alcohol/week: 1.0 standard drink of alcohol   Types: 1 Standard drinks or equivalent per week   Comment: occasional use only     Social History   Substance and Sexual Activity  Drug Use No    Social History   Socioeconomic History   Marital status: Single    Spouse name: Not on file   Number of children: Not on file   Years of education: Not on file   Highest education level: Not on file  Occupational History   Not on file  Tobacco Use   Smoking status: Light Smoker    Packs/day: 0.50    Years: 35.00    Total pack years: 17.50    Types: Cigarettes   Smokeless tobacco: Never   Tobacco comments:    patient is trying to quit and has gone down to half a pack per day 1 per day.  Vaping Use   Vaping Use: Never used  Substance and Sexual Activity   Alcohol use: Yes    Alcohol/week: 1.0 standard drink of alcohol    Types: 1 Standard drinks or equivalent per week    Comment: occasional use only   Drug use: No   Sexual activity: Not Currently  Other Topics Concern   Not on file  Social History Narrative   Not on file   Social Determinants of Health   Financial Resource Strain: Not on file  Food Insecurity: No Food Insecurity (09/04/2022)   Hunger Vital Sign    Worried About Running Out of Food in the Last Year: Never true    Ran Out of Food in the Last Year: Never true  Transportation Needs: Unmet Transportation Needs (09/04/2022)   PRAPARE - Hydrologist (Medical): Yes    Lack of Transportation (Non-Medical): Yes  Physical Activity: Inactive (07/28/2021)    Exercise Vital Sign    Days of Exercise per Week: 0 days    Minutes of Exercise per Session: 0 min  Stress: Not on file  Social Connections: Not on file   Additional Social History:    Allergies:   Allergies  Allergen Reactions   Nifedipine Er Other (See Comments)    Caused nose bleeds in higher doses (90 mg., namely)   Latex Rash   Lipitor [Atorvastatin] Swelling and Other (See Comments)    Lip swelling to lipitor Patient tolerates crestor    Labs:  Results for orders placed or performed during the hospital encounter of 08/24/22 (from the past 48 hour(s))  Glucose, capillary     Status: Abnormal   Collection Time:  09/19/22  8:37 PM  Result Value Ref Range   Glucose-Capillary 149 (H) 70 - 99 mg/dL    Comment: Glucose reference range applies only to samples taken after fasting for at least 8 hours.  Glucose, capillary     Status: Abnormal   Collection Time: 09/19/22 11:58 PM  Result Value Ref Range   Glucose-Capillary 170 (H) 70 - 99 mg/dL    Comment: Glucose reference range applies only to samples taken after fasting for at least 8 hours.  Glucose, capillary     Status: Abnormal   Collection Time: 09/20/22  3:54 AM  Result Value Ref Range   Glucose-Capillary 196 (H) 70 - 99 mg/dL    Comment: Glucose reference range applies only to samples taken after fasting for at least 8 hours.  Basic metabolic panel     Status: Abnormal   Collection Time: 09/20/22  4:43 AM  Result Value Ref Range   Sodium 130 (L) 135 - 145 mmol/L   Potassium 5.5 (H) 3.5 - 5.1 mmol/L   Chloride 98 98 - 111 mmol/L   CO2 17 (L) 22 - 32 mmol/L   Glucose, Bld 217 (H) 70 - 99 mg/dL    Comment: Glucose reference range applies only to samples taken after fasting for at least 8 hours.   BUN 65 (H) 8 - 23 mg/dL   Creatinine, Ser 3.82 (H) 0.44 - 1.00 mg/dL   Calcium 7.8 (L) 8.9 - 10.3 mg/dL   GFR, Estimated 12 (L) >60 mL/min    Comment: (NOTE) Calculated using the CKD-EPI Creatinine Equation (2021)     Anion gap 15 5 - 15    Comment: Performed at [facility] [address], [place], [place] [address]  Glucose, capillary     Status: Abnormal   Collection Time: 09/20/22  7:52 AM  Result Value Ref Range   Glucose-Capillary 215 (H) 70 - 99 mg/dL    Comment: Glucose reference range applies only to samples taken after fasting for at least 8 hours.  Gamma GT     Status: Abnormal   Collection Time: 09/20/22 10:10 AM  Result Value Ref Range   GGT 368 (H) 7 - 50 U/L    Comment: Performed at [facility], [place] [address] [place], [place] [address]  Lipase, blood     Status: None   Collection Time: 09/20/22 10:10 AM  Result Value Ref Range   Lipase 48 11 - 51 U/L    Comment: Performed at [facility] [address] [place], [place] [address]  Phosphorus     Status: None   Collection Time: 09/20/22 10:12 AM  Result Value Ref Range   Phosphorus 3.6 2.5 - 4.6 mg/dL    Comment: Performed at [facility] [address], [place], [place] [address]  Glucose, capillary     Status: Abnormal   Collection Time: 09/20/22 11:39 AM  Result Value Ref Range   Glucose-Capillary 160 (H) 70 - 99 mg/dL    Comment: Glucose reference range applies only to samples taken after fasting for at least 8 hours.  Glucose, capillary     Status: Abnormal   Collection Time: 09/20/22  4:51 PM  Result Value Ref Range   Glucose-Capillary 114 (H) 70 - 99 mg/dL    Comment: Glucose reference range applies only to samples taken after fasting for at least 8 hours.  Glucose, capillary     Status: Abnormal   Collection Time: 09/20/22  7:34 PM  Result Value Ref Range   Glucose-Capillary 103 (H) 70 - 99 mg/dL    Comment: Glucose reference range applies only to samples taken after fasting for at least 8 hours.  Glucose, capillary     Status: None   Collection Time: 09/20/22 11:41 PM  Result Value Ref Range   Glucose-Capillary 78 70 - 99 mg/dL    Comment: Glucose reference range applies only to samples  taken after fasting for at least 8 hours.  Glucose, capillary     Status: Abnormal   Collection Time: 09/21/22  4:39 AM  Result Value Ref Range   Glucose-Capillary 41 (LL) 70 - 99 mg/dL    Comment: Glucose reference range applies only to samples taken after fasting for at least 8 hours.   Comment 1 Notify RN   Glucose, capillary     Status: Abnormal   Collection Time: 09/21/22  5:03 AM  Result Value Ref Range   Glucose-Capillary 128 (H) 70 - 99 mg/dL    Comment: Glucose reference range applies only to samples taken after fasting for at least 8 hours.  Glucose, capillary     Status: Abnormal   Collection Time: 09/21/22  7:39 AM  Result Value Ref Range   Glucose-Capillary 52 (L) 70 - 99 mg/dL    Comment: Glucose reference range applies only to samples taken after fasting for at least 8 hours.  Glucose, capillary     Status: Abnormal   Collection Time: 09/21/22  8:06 AM  Result Value Ref Range   Glucose-Capillary 131 (H) 70 - 99 mg/dL    Comment: Glucose reference range applies only to samples taken after fasting for at least 8 hours.  Glucose, capillary     Status: None   Collection Time: 09/21/22 11:50 AM  Result Value Ref Range   Glucose-Capillary 81 70 - 99 mg/dL    Comment: Glucose reference range applies only to samples taken after fasting for at least 8 hours.  I-STAT, chem 8     Status: Abnormal   Collection Time: 09/21/22  1:13 PM  Result Value Ref Range   Sodium 130 (L) 135 - 145 mmol/L   Potassium 4.3 3.5 - 5.1 mmol/L   Chloride 95 (L) 98 - 111 mmol/L   BUN 55 (H) 8 - 23 mg/dL   Creatinine, Ser 3.60 (H) 0.44 - 1.00 mg/dL   Glucose, Bld 68 (L) 70 - 99 mg/dL    Comment: Glucose reference range applies only to samples taken after fasting for at least 8 hours.   Calcium, Ion 1.17 1.15 - 1.40 mmol/L   TCO2 20 (L) 22 - 32 mmol/L   Hemoglobin 12.6 12.0 - 15.0 g/dL   HCT 37.0 36.0 - 46.0 %  Glucose, capillary     Status: Abnormal   Collection Time: 09/21/22  2:04 PM  Result  Value Ref Range   Glucose-Capillary 104 (H) 70 - 99 mg/dL    Comment: Glucose reference range applies only to samples taken after fasting for at least 8 hours.  Glucose, capillary     Status: Abnormal   Collection Time: 09/21/22  4:47 PM  Result Value Ref Range   Glucose-Capillary 56 (L) 70 - 99 mg/dL    Comment: Glucose reference range applies only to samples taken after fasting for at least 8 hours.  Glucose, capillary     Status: Abnormal   Collection Time: 09/21/22  5:21 PM  Result Value Ref Range   Glucose-Capillary 136 (H) 70 -  99 mg/dL    Comment: Glucose reference range applies only to samples taken after fasting for at least 8 hours.    Current Facility-Administered Medications  Medication Dose Route Frequency Provider Last Rate Last Admin   acetaminophen (TYLENOL) suppository 650 mg  650 mg Rectal Q6H Pham, Minh Q, RPH-CPP       Or   acetaminophen (TYLENOL) 160 MG/5ML solution 1,000 mg  1,000 mg Per Tube Q6H Pham, Minh Q, RPH-CPP   1,000 mg at 09/20/22 1754   amLODipine (NORVASC) tablet 10 mg  10 mg Per Tube Daily Pham, Minh Q, RPH-CPP   10 mg at 09/20/22 1021   cyproheptadine (PERIACTIN) 2 MG/5ML syrup 2 mg  2 mg Per Tube QID Jodell Cipro, Sriramkumar, MD       dextrose 50 % solution 25 mL  25 mL Intravenous PRN Linus Galas, MD   25 mL at 09/21/22 1704   dicyclomine (BENTYL) capsule 10 mg  10 mg Per Tube TID AC & HS Pham, Minh Q, RPH-CPP   10 mg at 09/20/22 2336   DULoxetine (CYMBALTA) DR capsule 30 mg  30 mg Oral BID Nooruddin, Saad, MD   30 mg at 09/19/22 0847   enoxaparin (LOVENOX) injection 30 mg  30 mg Subcutaneous Q24H Axel Filler, MD   30 mg at 09/20/22 2356   feeding supplement (ENSURE ENLIVE / ENSURE PLUS) liquid 237 mL  237 mL Oral TID BM Axel Filler, MD   237 mL at 09/20/22 1251   feeding supplement (OSMOLITE 1.5 CAL) liquid 1,000 mL  1,000 mL Per Tube Q24H Axel Filler, MD       folic acid (FOLVITE) tablet 1 mg  1 mg Per Tube  Daily Pham, Minh Q, RPH-CPP   1 mg at 09/20/22 1021   free water 200 mL  200 mL Per Tube Q4H Rick Duff, MD   200 mL at 09/21/22 0447   lipase/protease/amylase (CREON) capsule 24,000 Units  24,000 Units Oral TID Rick Duff, MD   24,000 Units at 09/18/22 1719   metoCLOPramide (REGLAN) 5 MG/5ML solution 10 mg  10 mg Per Tube TID AC Jodell Cipro, Sriramkumar, MD   10 mg at 09/20/22 1754   metoprolol tartrate (LOPRESSOR) tablet 50 mg  50 mg Per Tube BID Angelica Pou, MD   50 mg at 09/20/22 2335   multivitamin (RENA-VIT) tablet 1 tablet  1 tablet Per Tube QHS Axel Filler, MD   1 tablet at 09/20/22 2340   OLANZapine zydis (ZYPREXA) disintegrating tablet 2.5 mg  2.5 mg Oral QHS Linus Galas, MD   2.5 mg at 09/20/22 2333   Oral care mouth rinse  15 mL Mouth Rinse 4 times per day Axel Filler, MD   15 mL at 09/21/22 1652   Oral care mouth rinse  15 mL Mouth Rinse PRN Axel Filler, MD       [START ON 09/23/2022] pantoprazole (PROTONIX) injection 40 mg  40 mg Intravenous Q12H Linus Galas, MD       pantoprozole (PROTONIX) 80 mg /NS 100 mL infusion  8 mg/hr Intravenous Continuous Linus Galas, MD 10 mL/hr at 09/21/22 1717 8 mg/hr at 09/21/22 1717   phenol (CHLORASEPTIC) mouth spray 1 spray  1 spray Mouth/Throat PRN Rick Duff, MD       phenylephrine-shark liver oil-mineral oil-petrolatum (PREPARATION H) rectal ointment 1 Application  1 Application Rectal BID PRN Rick Duff, MD       rosuvastatin (CRESTOR) tablet 10 mg  10  mg Per Tube QPM Pham, Minh Q, RPH-CPP   10 mg at 09/20/22 1754   sodium bicarbonate tablet 650 mg  650 mg Per Tube BID Axel Filler, MD   650 mg at 09/20/22 2337   sucralfate (CARAFATE) 1 GM/10ML suspension 1 g  1 g Per Tube BID Vena Rua, PA-C       thiamine (VITAMIN B1) tablet 100 mg  100 mg Per Tube Daily Pham, Minh Q, RPH-CPP   100 mg at 09/20/22 1022   Zinc Oxide (TRIPLE PASTE)  12.8 % ointment   Topical PRN Angelica Pou, MD   Given at 09/13/22 1617           Psychiatric Specialty Exam:  Presentation  General Appearance:  Appropriate for Environment; Casual  Eye Contact: Fair  Speech: Slow  Speech Volume: Decreased  Handedness:No data recorded  Mood and Affect  Mood: sad Affect: Constricted   Thought Process  Thought Processes: Coherent; Goal Directed; Linear  Descriptions of Associations:Intact  Orientation:Full (Time, Place and Person)  Thought Content:Logical  Hallucinations:Hallucinations: None   Ideas of Reference:None  Suicidal Thoughts:Suicidal Thoughts: No   Homicidal Thoughts:Homicidal Thoughts: No    Sensorium  Memory: Immediate Good; Recent Good; Remote Good  Judgment: Fair  Insight: Fair   Psychiatric nurse  Attention Span: Fair  Recall: [place] of Knowledge:Good  Language:Good   Psychomotor Activity  Psychomotor Activity:Psychomotor Activity: Decreased   Assets  Assets:Communication Skills; Desire for Improvement; Social Support   Sleep  Sleep: Sleep: Fair    Physical Exam: Physical Exam Vitals reviewed.  Pulmonary:     Effort: Pulmonary effort is normal.    Review of Systems  Psychiatric/Behavioral:  Positive for depression. Negative for hallucinations, substance abuse and suicidal ideas. The patient has insomnia. The patient is not nervous/anxious.    Blood pressure 112/74, pulse 97, temperature 97.6 F (36.4 C), temperature source Oral, resp. rate 18, height '5\' 8"'$  (1.727 m), weight 117.5 kg, SpO2 91 %. Body mass index is 39.38 kg/m.  Treatment Plan Summary:  Assessment: -Major depressive disorder, moderate, w/o psychotic features. Low appetite or decreased food intake - more likely due to learned avoidence (conditioning from painful stimuli) 2/2 pain or other GI symptoms. Does not meet criteria for ECT due to lack of  symptoms. Plan: -does not require inpt psych admission or 1:1 sitter -Continue zyprexa zydis 2.5 mg qhs for appetite stimulation  -consider periactin as a possible appetite stimulant should zydis be uptitrated and continue to be insufficient -Outpatient CBT for appetite -Cymbalta continued for depression.   Disposition: Patient does not meet criteria for psychiatric inpatient admission. Psych service will no longer follow but will be available for questions.  Corky Sox, MD 09/21/2022 5:30 PM

## 2022-09-21 NOTE — Progress Notes (Signed)
Blood glucose 68 via istat. MD Ell[place] with anesthesia made aware. D50 given by crna.

## 2022-09-21 NOTE — Inpatient Diabetes Management (Addendum)
Inpatient Diabetes Program Recommendations  AACE/ADA: New Consensus Statement on Inpatient Glycemic Control (2015)  Target Ranges:  Prepandial:   less than 140 mg/dL      Peak postprandial:   less than 180 mg/dL (1-2 hours)      Critically ill patients:  140 - 180 mg/dL   Lab Results  Component Value Date   GLUCAP 131 (H) 09/21/2022   HGBA1C 5.3 09/19/2022    Latest Reference Range & Units 09/20/22 07:52 09/20/22 11:39 09/20/22 16:51 09/20/22 19:34 09/20/22 23:41 09/21/22 04:39 09/21/22 05:03 09/21/22 07:39 09/21/22 08:06  Glucose-Capillary 70 - 99 mg/dL 215 (H) 160 (H) 114 (H) 103 (H) 78 41 (LL) 128 (H) 52 (L) 131 (H)   Diabetes history: DM2 Outpatient Diabetes medications: Januvia 25 QD  Current orders for Inpatient glycemic control: Semglee 6 units, Novolog 0-5 units q 4 hrs.  Inpatient Diabetes Program Recommendations:   Currently NPO for EGD. Hypoglycemia this am, treated with D50 25 cc and repeat CBG 128. May need to hold Semglee if supplements held.  Thank you, Nani Gasser. Agron Swiney, RN, MSN, CDE  Diabetes Coordinator Inpatient Glycemic Control Team Team Pager [phone] (8am-5pm) 09/21/2022 10:11 AM

## 2022-09-21 NOTE — Interval H&P Note (Signed)
History and Physical Interval Note:  09/21/2022 1:26 PM  Alicia [name]  has presented today for surgery, with the diagnosis of abdominal pain, nausea.  The various methods of treatment have been discussed with the patient and family. After consideration of risks, benefits and other options for treatment, the patient has consented to  Procedure(s): ESOPHAGOGASTRODUODENOSCOPY (EGD) WITH PROPOFOL (N/A) as a surgical intervention.  The patient's history has been reviewed, patient examined, no change in status, stable for surgery.  I have reviewed the patient's chart and labs.  Questions were answered to the patient's satisfaction.     Alicia [name]

## 2022-09-22 ENCOUNTER — Inpatient Hospital Stay (HOSPITAL_COMMUNITY): Payer: Medicare HMO

## 2022-09-22 DIAGNOSIS — N390 Urinary tract infection, site not specified: Secondary | ICD-10-CM | POA: Diagnosis not present

## 2022-09-22 DIAGNOSIS — E1110 Type 2 diabetes mellitus with ketoacidosis without coma: Secondary | ICD-10-CM | POA: Diagnosis not present

## 2022-09-22 DIAGNOSIS — F32A Depression, unspecified: Secondary | ICD-10-CM | POA: Diagnosis not present

## 2022-09-22 DIAGNOSIS — K269 Duodenal ulcer, unspecified as acute or chronic, without hemorrhage or perforation: Secondary | ICD-10-CM | POA: Diagnosis not present

## 2022-09-22 DIAGNOSIS — K8689 Other specified diseases of pancreas: Secondary | ICD-10-CM

## 2022-09-22 DIAGNOSIS — E876 Hypokalemia: Secondary | ICD-10-CM | POA: Diagnosis not present

## 2022-09-22 DIAGNOSIS — N184 Chronic kidney disease, stage 4 (severe): Secondary | ICD-10-CM | POA: Diagnosis not present

## 2022-09-22 DIAGNOSIS — I509 Heart failure, unspecified: Secondary | ICD-10-CM | POA: Diagnosis not present

## 2022-09-22 DIAGNOSIS — I5030 Unspecified diastolic (congestive) heart failure: Secondary | ICD-10-CM

## 2022-09-22 DIAGNOSIS — R1084 Generalized abdominal pain: Secondary | ICD-10-CM | POA: Diagnosis not present

## 2022-09-22 DIAGNOSIS — I130 Hypertensive heart and chronic kidney disease with heart failure and stage 1 through stage 4 chronic kidney disease, or unspecified chronic kidney disease: Secondary | ICD-10-CM | POA: Diagnosis not present

## 2022-09-22 DIAGNOSIS — N179 Acute kidney failure, unspecified: Secondary | ICD-10-CM | POA: Diagnosis not present

## 2022-09-22 DIAGNOSIS — G8929 Other chronic pain: Secondary | ICD-10-CM | POA: Diagnosis not present

## 2022-09-22 DIAGNOSIS — R531 Weakness: Secondary | ICD-10-CM | POA: Diagnosis not present

## 2022-09-22 DIAGNOSIS — D649 Anemia, unspecified: Secondary | ICD-10-CM | POA: Diagnosis not present

## 2022-09-22 DIAGNOSIS — K529 Noninfective gastroenteritis and colitis, unspecified: Secondary | ICD-10-CM | POA: Diagnosis not present

## 2022-09-22 DIAGNOSIS — R638 Other symptoms and signs concerning food and fluid intake: Secondary | ICD-10-CM | POA: Diagnosis not present

## 2022-09-22 LAB — ECHOCARDIOGRAM COMPLETE
AR max vel: 2.1 cm2
AV Area VTI: 2.22 cm2
AV Area mean vel: 1.97 cm2
AV Mean grad: 5 mmHg
AV Peak grad: 9.9 mmHg
Ao pk vel: 1.57 m/s
Area-P 1/2: 3.12 cm2
Height: 68 in
P 1/2 time: 747 msec
S' Lateral: 2.3 cm
Weight: 4144 oz

## 2022-09-22 LAB — COMPREHENSIVE METABOLIC PANEL
ALT: 53 U/L — ABNORMAL HIGH (ref 0–44)
AST: 28 U/L (ref 15–41)
Albumin: 1.7 g/dL — ABNORMAL LOW (ref 3.5–5.0)
Alkaline Phosphatase: 409 U/L — ABNORMAL HIGH (ref 38–126)
Anion gap: 15 (ref 5–15)
BUN: 66 mg/dL — ABNORMAL HIGH (ref 8–23)
CO2: 19 mmol/L — ABNORMAL LOW (ref 22–32)
Calcium: 8 mg/dL — ABNORMAL LOW (ref 8.9–10.3)
Chloride: 100 mmol/L (ref 98–111)
Creatinine, Ser: 3.73 mg/dL — ABNORMAL HIGH (ref 0.44–1.00)
GFR, Estimated: 12 mL/min — ABNORMAL LOW (ref >60)
Glucose, Bld: 76 mg/dL (ref 70–99)
Potassium: 3.8 mmol/L (ref 3.5–5.1)
Sodium: 134 mmol/L — ABNORMAL LOW (ref 135–145)
Total Bilirubin: 0.8 mg/dL (ref 0.3–1.2)
Total Protein: 4.6 g/dL — ABNORMAL LOW (ref 6.5–8.1)

## 2022-09-22 LAB — GLUCOSE, CAPILLARY
Glucose-Capillary: 131 mg/dL — ABNORMAL HIGH (ref 70–99)
Glucose-Capillary: 68 mg/dL — ABNORMAL LOW (ref 70–99)
Glucose-Capillary: 72 mg/dL (ref 70–99)
Glucose-Capillary: 77 mg/dL (ref 70–99)
Glucose-Capillary: 78 mg/dL (ref 70–99)
Glucose-Capillary: 79 mg/dL (ref 70–99)
Glucose-Capillary: 80 mg/dL (ref 70–99)

## 2022-09-22 LAB — C DIFFICILE (CDIFF) QUICK SCRN (NO PCR REFLEX)
C Diff antigen: POSITIVE — AB
C Diff toxin: NEGATIVE

## 2022-09-22 MED ORDER — DEXTROSE 50 % IV SOLN
1.0000 | Freq: Once | INTRAVENOUS | Status: AC
  Administered 2022-09-22: 50 mL via INTRAVENOUS
  Filled 2022-09-22: qty 50

## 2022-09-22 NOTE — Consult Note (Addendum)
Daily Progress Note  Hospital Day: 90  Chief Complaint: abdominal pain, loose stool, nausea  Brief History Patient is a 71 yo female with a pmh of H.pylori,gastritis, PUD, chronic nausea, chronic abdominal pain, colon polyps, HTN, HLD, obesity, PAD, CVA, CKD4, heart failure, headaches. Seen in consultation by [place] on  10/11 for evaluation of post-prandial abdominal pain , N/V.  Found to have PUD on EGD. Reconsulted 10/31 for ongoing abdominal pain, nausea.   Assessment / Plan  # 71 yo female with chronic nausea /chronic abdominal pain. Now with prolonged admission for these symptoms. PUD on EGD x2 this admission.  Unrevealing non-contrast CT scan x 2, mesenteric [place], MRA angio abdomen ( limited study) RUQ [place], cortisol level, celiac serologies, stool studies ( to check for infection).  EGD on 10/12 showing non-bleeding duodenal ulcers and duodenitis. Biopsies negative for H. Pylori.  Repeat EGD yesterday, still with PUD. Biopsies pending.   Continue PPI at 8 mg / hr through today then BID   Continue Carafate x 7 days. Dose was decreased due to CKD.  Cortrack being replaced today, was removed for EGD. She may end up need a G tube due to food aversion / poor appetite  # Loose stool.  Possibly exocrine pancreatic insufficiency based on low fecal elastase Infectious workup negative several days ago. We ordered repeat studies a couple of days ago. Doesn't look like they were collected. Will check with nurse.  Continue pancreatic enzymes when able to eat   # Markedly elevated Alk phos, new. Alk phos 400s-500s. Normal bilirubin. Minimal elevation in liver enzymes. Medication related? Congestive hepatopathy? No hepatobiliary findings on imaging. GGT elevated pointing toward liver as source.  Was unable to order alk phos isoenzymes.    # Volume overload, at least + 15 Liter fluid balance. Has low albumin / CKD.   Primary team getting echo.    Subjective   She has no appetite, still has some  nausea. Says stools are still loose but no having as much diarrhea.   Objective   Endoscopic studies:  09/21/22 EGD -Small hiatal hernia. - Significant duodenal ulcers with duodenitis. No high risk stigmata for bleeding. No outlet obstruction. Gastritis, biopsied  Imaging:  CT ABDOMEN PELVIS WO CONTRAST  Result Date: 09/20/2022 CLINICAL DATA:  Chronic abdominal pain. EXAM: CT ABDOMEN AND PELVIS WITHOUT CONTRAST TECHNIQUE: Multidetector CT imaging of the abdomen and pelvis was performed following the standard protocol without IV contrast. RADIATION DOSE REDUCTION: This exam was performed according to the departmental dose-optimization program which includes automated exposure control, adjustment of the mA and/or kV according to patient size and/or use of iterative reconstruction technique. COMPARISON:  Limited abdominal ultrasound 09/15/2022. CT abdomen and pelvis 08/24/2022. FINDINGS: Lower chest: There are new small bilateral pleural effusions with atelectasis in the bilateral lung bases. Hepatobiliary: No focal liver abnormality is seen. No gallstones, gallbladder wall thickening, or biliary dilatation. Pancreas: Unremarkable. No pancreatic ductal dilatation or surrounding inflammatory changes. Spleen: Normal in size without focal abnormality. Adrenals/Urinary Tract: Left adrenal thickening is nonspecific and unchanged. Right adrenal gland within normal limits. There is no hydronephrosis. Prominent vascular calcifications are seen in the kidneys. No ureteral or bladder calculi are seen. The bladder is markedly distended. Stomach/Bowel: Stomach is within normal limits. Appendix appears normal. No evidence of bowel wall thickening, distention, or inflammatory changes. There is sigmoid colon diverticulosis without evidence for acute diverticulitis. Enteric tube tip is in the proximal jejunum. Vascular/Lymphatic: Aortic atherosclerosis. No enlarged abdominal or pelvic  lymph nodes. Reproductive: Focal  rounded hypodense area seen in the lower uterine segment measuring 1.9 cm, indeterminate and unchanged. Otherwise, uterus and adnexa are within normal limits. Other: There is no ascites or free air. There is diffuse body wall edema which has increased from prior. Moderate-sized fat containing umbilical hernia appears unchanged from the prior examination. There is some fluid in the hernia sac as well, also unchanged. Musculoskeletal: Degenerative changes affect the spine. IMPRESSION: 1. New small bilateral pleural effusions with bibasilar atelectasis. 2. Diffuse body wall edema has increased from prior. 3. Marked distention of the bladder. 4. Stable fat containing umbilical hernia with fluid in the hernia sac. 5. Stable indeterminate hypodense area in the lower uterine segment. This can be further evaluated with pelvic ultrasound. 6. Sigmoid colon diverticulosis without evidence for acute diverticulitis. 7. Enteric tube tip is in the proximal jejunum. Aortic Atherosclerosis (ICD10-I70.0). Electronically Signed   By: Ronney Asters M.D.   On: 09/20/2022 21:05    Lab Results: Recent Labs    09/21/22 1313  HGB 12.6  HCT 37.0   BMET Recent Labs    09/20/22 0443 09/21/22 1313 09/22/22 1026  NA 130* 130* 134*  K 5.5* 4.3 3.8  CL 98 95* 100  CO2 17*  --  19*  GLUCOSE 217* 68* 76  BUN 65* 55* 66*  CREATININE 3.82* 3.60* 3.73*  CALCIUM 7.8*  --  8.0*   LFT Recent Labs    09/22/22 1026  PROT 4.6*  ALBUMIN 1.7*  AST 28  ALT 53*  ALKPHOS 409*  BILITOT 0.8   PT/INR No results for input(s): "LABPROT", "INR" in the last 72 hours.   Scheduled inpatient medications:   acetaminophen  650 mg Rectal Q6H   Or   acetaminophen (TYLENOL) oral liquid 160 mg/5 mL  1,000 mg Oral Q6H   amLODipine  10 mg Oral Daily   cyproheptadine  2 mg Per Tube QID   dicyclomine  10 mg Oral TID AC & HS   DULoxetine  30 mg Oral BID   enoxaparin (LOVENOX) injection  30 mg Subcutaneous Q24H   feeding supplement  237  mL Oral TID BM   feeding supplement (OSMOLITE 1.5 CAL)  1,000 mL Per Tube Q59D   folic acid  1 mg Oral Daily   free water  200 mL Per Tube Q4H   lipase/protease/amylase  24,000 Units Oral TID   metoCLOPramide  10 mg Oral TID AC   metoprolol tartrate  50 mg Oral BID   multivitamin  1 tablet Per Tube QHS   OLANZapine zydis  2.5 mg Oral QHS   mouth rinse  15 mL Mouth Rinse 4 times per day   [START ON 09/23/2022] pantoprazole  40 mg Intravenous Q12H   rosuvastatin  10 mg Per Tube QPM   sodium bicarbonate  650 mg Oral BID   sucralfate  1 g Oral BID   thiamine  100 mg Per Tube Daily   Continuous inpatient infusions:   pantoprazole 8 mg/hr (09/22/22 0138)   PRN inpatient medications: dextrose, mouth rinse, phenol, phenylephrine-shark liver oil-mineral oil-petrolatum, Zinc Oxide  Vital signs in last 24 hours: Temp:  [97.4 F (36.3 C)-98.6 F (37 C)] 97.8 F (36.6 C) (11/02 0842) Pulse Rate:  [69-97] 73 (11/02 0842) Resp:  [12-20] 13 (11/02 0842) BP: (112-146)/(53-94) 126/53 (11/02 0842) SpO2:  [91 %-100 %] 94 % (11/02 0842) Last BM Date : 09/20/22  Intake/Output Summary (Last 24 hours) at 09/22/2022 1149 Last data filed at  09/22/2022 0800 Gross per 24 hour  Intake 970.58 ml  Output 1500 ml  Net -529.42 ml    Intake/Output from previous day: 11/01 0701 - 11/02 0700 In: 850.6 [I.V.:850.6] Out: 1500 [Urine:1500] Intake/Output this shift: Total I/O In: 120 [P.O.:120] Out: -    Physical Exam:  General: Alert female in NAD Heart:  Regular rate and rhythm. BLE pitting edema Pulmonary: Normal respiratory effort Abdomen: Soft, nondistended, nontender. Normal bowel sounds.  Neurologic: Alert and oriented Psych: Pleasant. Cooperative.    Principal Problem:   Chronic generalized abdominal pain Active Problems:   Acute worsening of stage 4 chronic kidney disease (HCC)   Generalized weakness   Pressure injury of skin   Nausea and vomiting   Diarrhea   Duodenal ulcer    Mucosal abnormality of stomach   Mucosal abnormality of esophagus   Food aversion   Hypoalbuminemia due to protein-calorie malnutrition ([place])     LOS: 23 days   Tye Savoy ,NP 09/22/2022, 11:49 AM    Attending physician's note   I have taken history, reviewed the chart and examined the patient. I performed a substantive portion of this encounter, including complete performance of at least one of the key components, in conjunction with the APP. I agree with the Advanced Practitioner's note, impression and recommendations.   No new GI recommendations. For long-term feeding, prefer GJ tube rather than just G-tube (d/t assoc gastroparesis).  When feasible, please contact IR. Will sign off for now.   Carmell Austria, MD [facility] [phone]

## 2022-09-22 NOTE — Progress Notes (Signed)
Blood glucose 78, Foley output 225 for the shift, Foley irrigated without difficulty, MD aware, no new orders obtained at present  Anastasio Auerbach, RN

## 2022-09-22 NOTE — Progress Notes (Signed)
Clarification D50 order: 1/2 amp D50 given per MD for blood glucose of 72  Alicia [name]

## 2022-09-22 NOTE — Progress Notes (Signed)
Pt. Refused her lunch and cont. To refuse oral meds, blood glucose 72, MD aware of pt. Not eating and blood glucose, order for 1 amp D50 obtained, MD also aware that pt. Does not have coretrack and that coretrack team is not available today   Alicia [name]

## 2022-09-22 NOTE — Progress Notes (Signed)
Pt. Refused breakfast this am, pt. Also refused all oral medications, explained to pt. The importance of eating, maintaining a healthy blood glucose and taking her medications as prescribed, pt. Verbalized understanding but still refuses, MD aware  Anastasio Auerbach

## 2022-09-22 NOTE — Progress Notes (Signed)
Nutrition Follow-up  DOCUMENTATION CODES:   Obesity unspecified  INTERVENTION:   - If within pt's [place], recommend pursuing permanent enteral access  Resume continuous tube feeding after Cortrak placed on 09/23/22 and gastric placement confirmed: - Start Osmolite 1.5 @ 20 ml/hr and advance by 10 ml q 8 hours to goal rate of 50 ml/hr (1200 ml/day) - PROSource TF20 60 ml daily - Free water flushes per MD, currently 200 ml q 4 hours  Tube feeding regimen at goal rate provides 1880 kcal, 95 grams of protein, and 914 ml of H2O.  Total free water with flushes: 2114 ml  - Continue renal MVI daily, thiamine 100 mg daily, and folic acid 1 mg daily  NUTRITION DIAGNOSIS:   Inadequate oral intake related to inability to eat, nausea as evidenced by meal completion < 50%.  Ongoing, being addressed by resuming TF  GOAL:   Patient will meet greater than or equal to 90% of their needs  Unmet at this time, being addressed by resuming TF  MONITOR:   PO intake, Supplement acceptance  REASON FOR ASSESSMENT:   Consult Assessment of nutrition requirement/status  ASSESSMENT:   71 y.o. female admits related to weakness. PMH includes: HTN, DM, CKD4, PAD. Pt is currently receiving medical management for weakness and abdominal pain.  10/12 -  s/p EGD showing duodenitis with duodenal ulcers, nodular mucosa and GEJ, altered mucosa in antrum and prepyloric region of stomach 10/18 - Cortrak placed (tip in fourth portion of duodenum) 10/26 - Cortrak tube became clogged 10/27 - Cortrak patent, diet advanced to Regular with thin liquids 10/28 - transitioned to nocturnal tube feeds, calorie count started 11/01 - NPO, s/p EGD revealing stable duodenal ulcers, Cortrak removed and unable to be replaced, diet advanced to full liquid  Pt refusing all PO medications and PO intake. Pt currently on full liquid diet. PO intake at mealtimes remains 0%. Nephrology, Psychiatry, and GI have been  consulted.  Discussed pt with MD. Plan for Cortrak replacement tomorrow. Recommend continuous feeds given nocturnal feeds did not improve pt's appetite or PO intake. MD in agreement. Recommend trial of gastric feeds via Cortrak given PEG is being considered. MD in agreement.  After Cortrak placed on 09/23/22, will start tube feeds at trickle rate and slowly advance to goal to monitor for tolerance of gastric feeds. MD concerned pt may have gastroparesis but pt did not tolerate gastric emptying study to confirm. Pt on reglan at this time.  Admit weight: 110 kg Current weight: 117.5 kg  Meal Completion: 0%  Medications reviewed and include: bentyl 10 mg QID, Ensure Enlive TID, folic acid, Creon 23,557 units TID, reglan 10 mg TID, renavit, zyprexa, protonix drip, sodium bicarb 650 mg BID, sucralfate, thiamine  Labs reviewed: sodium 134, BUN 66, creatinine 3.73, alkaline phosphatase 409 CBG's: 56-136 x 24 hours  UOP: 1500 ml x 24 hours I/O's: +25.9 L since admit  Diet Order:   Diet Order             Diet full liquid Room service appropriate? Yes; Fluid consistency: Thin  Diet effective now                   EDUCATION NEEDS:   Not appropriate for education at this time  Skin:  Skin Assessment: Skin Integrity Issues: Stage I: medial sacrum Non-pressure wounds to bilateral thighs  Last BM:  09/22/22 type 7  Height:   Ht Readings from Last 1 Encounters:  08/24/22 '5\' 8"'$  (1.727 m)  Weight:   Wt Readings from Last 1 Encounters:  09/21/22 117.5 kg    Ideal Body Weight:  63.6 kg  BMI:  Body mass index is 39.38 kg/m.  Estimated Nutritional Needs:   Kcal:  1700-1900 kcals  Protein:  80-95 gm  Fluid:  >/= 1.7 L    Gustavus Bryant, MS, RD, LDN Inpatient Clinical Dietitian Please see [facility] for contact information.

## 2022-09-22 NOTE — Progress Notes (Signed)
Pt. Encouraged to take her oral meds, pt. Cont. To refuse  Anastasio Auerbach

## 2022-09-22 NOTE — Progress Notes (Signed)
Patient ID: [name], female   DOB: Jul 17, 1951, 71 y.o.   MRN: [id number]      Progress Note from the Palliative Medicine Team at [facility]   Patient Name: Alicia [name]        Date: 09/22/2022 DOB: [birth year]  Age: 71 y.o. MRN#: [id number] Attending Physician: Axel Filler, * Primary Care Physician: Rick Duff, MD Admit Date: 08/24/2022   Medical records reviewed   71 y.o. female  admitted on 08/24/2022 with  complex past medical history;  H.pylori gastritis (treated in 2020, follow up eradication testing negative), chronic abdominal pain,  chronic nausea,  colon polyps, pancreatic insufficiency, chronic IMA occlusion, duodenal ulcers, obesity    Admitted with weakness, post-prandial abdominal pain, N/V, loose stools.  AKI on CKD 4, refusing to eat/psych consulted, depression   Patient/family  faces treatment option decisions, advanced directive decisions and anticipatory care needs.  I spoke to daughter by phone in the room with the patient , patient is lethargic today.  She is refusing to take even a sip of water or ensure from me.     Daughter is trying to encourage   Education offered on her multiple co-morbidities and the seriousness of her current medical situation.    Education offered on the pending decisions regarding treatment plan, and advance directives and anticipatory care needs.  Currently main decision patient is facing his desire for artificial feeding/PEG tube  Education offered on a patient right to autonomy as it relates to medical decisions, acceptance or refusal.  Education offered to both patient and father that it is imperative that the patient have capacity and understand the consequences of decision .  I worry that today patient is too lethargic and does not have  capacity to make medical decisions at this time.    Daughter expresses desire and is open to all offered and available medical interventions to prolong life  for her mother.  Education offered today regarding  the importance of continued conversation with family and their  medical providers regarding overall plan of care and treatment options,  ensuring decisions are within the context of the patients values and GOCs.  Questions and concerns addressed   Discussed with Dr   Jodell Cipro, discussed underlying depression and need to reconsult psychiatry.  Total time spent on the unit was 75 minutes   Wadie Lessen NP  Palliative Medicine Team Team Phone # [phone] Pager [phone]

## 2022-09-22 NOTE — Progress Notes (Signed)
MD aware of C-Diff antigen positve  Anastasio Auerbach, RN

## 2022-09-22 NOTE — Progress Notes (Signed)
[name] Progress Note   Assessment/ Plan:   AKI on CKD 4: BL possibly 2.5-3 but fluctuates. Previously was followed by Dr Joylene Grapes  - main issue looks like no Po intake--> now with urinary retention on top of everything  - stop IVFs with pleural effusions  - Foley in place now  - dont' expect this BUN to cause uremia- no indication for RRT  - dialysis would not be of utility in this setting- she appears profoundly depressed too but with some duodenal ulcers (which could be contributing to food aversion but not the whole picture) and if she's not eating dialysis essentially prolongs death by starvation.    - Zyprexa replaced remeron recently  - needs palliative care c/s, appreciate assistance  - only 30 mg protein on last UA, will send UP/C to be sure  - may need to add torsemide in the near future- would start with 20 mg daily  Depression:  - Zyprexa  - already tried remeron without much improvement  Abd pain: unknown etiology. NG tube in place. Extensive workup.  - endoscopy 11/1- duodenal ulcers, carafate reduced, on PPI   Elevated LFTs:  - per GI  - reorder TTE- pending    HTN: BP slightly high. Cont current meds  Subjective:    Seen in room.  S/p EGD yesterday with duodenal ulcers.   Objective:   BP (!) 126/53 (BP Location: Left Arm)   Pulse 73   Temp 97.8 F (36.6 C) (Oral)   Resp 13   Ht '5\' 8"'$  (1.727 m)   Wt 117.5 kg   SpO2 94%   BMI 39.38 kg/m   Intake/Output Summary (Last 24 hours) at 09/22/2022 1239 Last data filed at 09/22/2022 1208 Gross per 24 hour  Intake 970.58 ml  Output 900 ml  Net 70.58 ml   Weight change:   Physical Exam: JME:QAST affect, lygin in bed CVS:RRR Resp: clear MHD:QQIW Ext: 1+ LE edema  Imaging: CT ABDOMEN PELVIS WO CONTRAST  Result Date: 09/20/2022 CLINICAL DATA:  Chronic abdominal pain. EXAM: CT ABDOMEN AND PELVIS WITHOUT CONTRAST TECHNIQUE: Multidetector CT imaging of the abdomen and pelvis was performed  following the standard protocol without IV contrast. RADIATION DOSE REDUCTION: This exam was performed according to the departmental dose-optimization program which includes automated exposure control, adjustment of the mA and/or kV according to patient size and/or use of iterative reconstruction technique. COMPARISON:  Limited abdominal ultrasound 09/15/2022. CT abdomen and pelvis 08/24/2022. FINDINGS: Lower chest: There are new small bilateral pleural effusions with atelectasis in the bilateral lung bases. Hepatobiliary: No focal liver abnormality is seen. No gallstones, gallbladder wall thickening, or biliary dilatation. Pancreas: Unremarkable. No pancreatic ductal dilatation or surrounding inflammatory changes. Spleen: Normal in size without focal abnormality. Adrenals/Urinary Tract: Left adrenal thickening is nonspecific and unchanged. Right adrenal gland within normal limits. There is no hydronephrosis. Prominent vascular calcifications are seen in the kidneys. No ureteral or bladder calculi are seen. The bladder is markedly distended. Stomach/Bowel: Stomach is within normal limits. Appendix appears normal. No evidence of bowel wall thickening, distention, or inflammatory changes. There is sigmoid colon diverticulosis without evidence for acute diverticulitis. Enteric tube tip is in the proximal jejunum. Vascular/Lymphatic: Aortic atherosclerosis. No enlarged abdominal or pelvic lymph nodes. Reproductive: Focal rounded hypodense area seen in the lower uterine segment measuring 1.9 cm, indeterminate and unchanged. Otherwise, uterus and adnexa are within normal limits. Other: There is no ascites or free air. There is diffuse body wall edema which has increased  from prior. Moderate-sized fat containing umbilical hernia appears unchanged from the prior examination. There is some fluid in the hernia sac as well, also unchanged. Musculoskeletal: Degenerative changes affect the spine. IMPRESSION: 1. New small  bilateral pleural effusions with bibasilar atelectasis. 2. Diffuse body wall edema has increased from prior. 3. Marked distention of the bladder. 4. Stable fat containing umbilical hernia with fluid in the hernia sac. 5. Stable indeterminate hypodense area in the lower uterine segment. This can be further evaluated with pelvic ultrasound. 6. Sigmoid colon diverticulosis without evidence for acute diverticulitis. 7. Enteric tube tip is in the proximal jejunum. Aortic Atherosclerosis (ICD10-I70.0). Electronically Signed   By: Ronney Asters M.D.   On: 09/20/2022 21:05    Labs: BMET Recent Labs  Lab 09/16/22 1046 09/17/22 1057 09/18/22 0338 09/19/22 0738 09/20/22 0443 09/20/22 1012 09/21/22 1313 09/22/22 1026  NA 134* 129* 132* 133* 130*  --  130* 134*  K 4.5 6.0* 4.5 4.4 5.5*  --  4.3 3.8  CL 104 99 103 99 98  --  95* 100  CO2 17* 19* 18* 18* 17*  --   --  19*  GLUCOSE 90 137* 103* 215* 217*  --  68* 76  BUN 55* 56* 57* 61* 65*  --  55* 66*  CREATININE 3.53* 3.53* 3.42* 3.63* 3.82*  --  3.60* 3.73*  CALCIUM 7.7* 7.6* 7.7* 8.1* 7.8*  --   --  8.0*  PHOS 2.9  --   --   --   --  3.6  --   --    CBC Recent Labs  Lab 09/16/22 1046 09/17/22 1057 09/19/22 0738 09/21/22 1313  WBC 9.1 9.4 9.1  --   HGB 10.4* 10.3* 11.4* 12.6  HCT 30.0* 29.2* 34.1* 37.0  MCV 92.0 92.4 95.8  --   PLT 269 259 330  --     Medications:     acetaminophen  650 mg Rectal Q6H   Or   acetaminophen (TYLENOL) oral liquid 160 mg/5 mL  1,000 mg Oral Q6H   amLODipine  10 mg Oral Daily   cyproheptadine  2 mg Per Tube QID   dicyclomine  10 mg Oral TID AC & HS   DULoxetine  30 mg Oral BID   enoxaparin (LOVENOX) injection  30 mg Subcutaneous Q24H   feeding supplement  237 mL Oral TID BM   feeding supplement (OSMOLITE 1.5 CAL)  1,000 mL Per Tube O27O   folic acid  1 mg Oral Daily   free water  200 mL Per Tube Q4H   lipase/protease/amylase  24,000 Units Oral TID   metoCLOPramide  10 mg Oral TID AC   metoprolol  tartrate  50 mg Oral BID   multivitamin  1 tablet Per Tube QHS   OLANZapine zydis  2.5 mg Oral QHS   mouth rinse  15 mL Mouth Rinse 4 times per day   [START ON 09/23/2022] pantoprazole  40 mg Intravenous Q12H   rosuvastatin  10 mg Per Tube QPM   sodium bicarbonate  650 mg Oral BID   sucralfate  1 g Oral BID   thiamine  100 mg Per Tube Daily    Madelon Lips MD 09/22/2022, 12:39 PM

## 2022-09-22 NOTE — Plan of Care (Signed)
  Problem: Education: Goal: Ability to describe self-care measures that may prevent or decrease complications (Diabetes Survival Skills Education) will improve Outcome: Not Progressing Goal: Individualized Educational Video(s) Outcome: Not Progressing   Problem: Coping: Goal: Ability to adjust to condition or change in health will improve Outcome: Not Progressing   Problem: Fluid Volume: Goal: Ability to maintain a balanced intake and output will improve Outcome: Not Progressing   Problem: Health Behavior/Discharge Planning: Goal: Ability to identify and utilize available resources and services will improve Outcome: Not Progressing Goal: Ability to manage health-related needs will improve Outcome: Not Progressing   Problem: Metabolic: Goal: Ability to maintain appropriate glucose levels will improve Outcome: Not Progressing   Problem: Nutritional: Goal: Maintenance of adequate nutrition will improve Outcome: Not Progressing Goal: Progress toward achieving an optimal weight will improve Outcome: Not Progressing   Problem: Skin Integrity: Goal: Risk for impaired skin integrity will decrease Outcome: Not Progressing   Problem: Tissue Perfusion: Goal: Adequacy of tissue perfusion will improve Outcome: Not Progressing   Problem: Education: Goal: Knowledge of General Education information will improve Description: Including pain rating scale, medication(s)/side effects and non-pharmacologic comfort measures Outcome: Not Progressing   Problem: Health Behavior/Discharge Planning: Goal: Ability to manage health-related needs will improve Outcome: Not Progressing   Problem: Clinical Measurements: Goal: Ability to maintain clinical measurements within normal limits will improve Outcome: Not Progressing Goal: Will remain free from infection Outcome: Not Progressing Goal: Diagnostic test results will improve Outcome: Not Progressing Goal: Respiratory complications will  improve Outcome: Not Progressing Goal: Cardiovascular complication will be avoided Outcome: Not Progressing   Problem: Activity: Goal: Risk for activity intolerance will decrease Outcome: Not Progressing   Problem: Nutrition: Goal: Adequate nutrition will be maintained Outcome: Not Progressing   Problem: Coping: Goal: Level of anxiety will decrease Outcome: Not Progressing   Problem: Elimination: Goal: Will not experience complications related to bowel motility Outcome: Not Progressing Goal: Will not experience complications related to urinary retention Outcome: Not Progressing   Problem: Pain Managment: Goal: General experience of comfort will improve Outcome: Not Progressing   Problem: Safety: Goal: Ability to remain free from injury will improve Outcome: Not Progressing   Problem: Skin Integrity: Goal: Risk for impaired skin integrity will decrease Outcome: Not Progressing   Problem: Nutrition Goal: Patient maintains adequate hydration Outcome: Not Progressing Goal: Patient maintains weight Outcome: Not Progressing Goal: Patient/Family demonstrates understanding of diet Outcome: Not Progressing Goal: Patient/Family independently completes tube feeding Outcome: Not Progressing Goal: Patient will have no more than 5 lb weight change during LOS Outcome: Not Progressing Goal: Patient will utilize adaptive techniques to administer nutrition Outcome: Not Progressing Goal: Patient will verbalize dietary restrictions Outcome: Not Progressing

## 2022-09-22 NOTE — Progress Notes (Signed)
Subjective:   Summary: Alicia [name] is a 71 y.o. year old female currently admitted on the IMTS HD#29 for diffuse abdominal pain as well as severely decreased PO intake likely multifactorial in etiology due to pancreatic insufficiency, chronic IMA occlusion, duodenal ulcers.  Overnight Events:  - EGD yesterday revealed similar appearance and size of duodenal ulcers.  GI recs below. - Cortrack had to be removed for EGD, unable to be replaced yesterday afternoon.  Attempted to encourage patient to take in more p.o. intake overnight, but her p.o. intake is still negligible.  All insulin has been held but she still had multiple episodes of hypoglycemia.  These were initially treated with D50 and then she was started on D5/normal saline infusion for a few hours.  Infusion was stopped this morning due to her existing volume overload.  Unfortunately, cortrak cannot be replaced until tomorrow - Patient reports some abdominal pain and nausea but overall, her symptoms are well managed.  She is declining most p.o. meds this morning, including liquid suspensions.  Primary obstacle to taking p.o. intake according to her is aversion to food and not having much of an appetite.  We have discussed this extensively the importance of p.o. intake with her, with limited improvement.  Objective:  Vital signs in last 24 hours: Vitals:   09/21/22 1440 09/21/22 2008 09/22/22 0606 09/22/22 0842  BP: 112/74 (!) 146/79 (!) 135/93 (!) 126/53  Pulse: 97  80 73  Resp: 18   13  Temp: 97.6 F (36.4 C) (!) 97.4 F (36.3 C) 97.6 F (36.4 C) 97.8 F (36.6 C)  TempSrc: Oral Oral  Oral  SpO2: 91%   94%  Weight:      Height:       Supplemental O2: Room Air    Physical Exam:  Constitutional: NAD HEENT:  Abdominal: minimal diffuse TTP, no guarding or rebound.  Distended but soft.  Periumbilical hernia unchanged.  Extremities: 2+ pitting edema of lower extremities bilaterally Neuro/Psych: Flat  affect today   Intake/Output Summary (Last 24 hours) at 09/22/2022 1112 Last data filed at 09/22/2022 0800 Gross per 24 hour  Intake 970.58 ml  Output 1500 ml  Net -529.42 ml   Net IO Since Admission: 25,921.78 mL [09/22/22 1112]  Pertinent Labs: Unable to obtain labs today after multiple attempts    Latest Ref Rng & Units 09/21/2022    1:13 PM 09/19/2022    7:38 AM 09/17/2022   10:57 AM  CBC  WBC 4.0 - 10.5 K/uL  9.1  9.4   Hemoglobin 12.0 - 15.0 g/dL 12.6  11.4  10.3   Hematocrit 36.0 - 46.0 % 37.0  34.1  29.2   Platelets 150 - 400 K/uL  330  259        Latest Ref Rng & Units 09/22/2022   10:26 AM 09/21/2022    1:13 PM 09/20/2022    4:43 AM  CMP  Glucose 70 - 99 mg/dL 76  68  217   BUN 8 - 23 mg/dL 66  55  65   Creatinine 0.44 - 1.00 mg/dL 3.73  3.60  3.82   Sodium 135 - 145 mmol/L 134  130  130   Potassium 3.5 - 5.1 mmol/L 3.8  4.3  5.5   Chloride 98 - 111 mmol/L 100  95  98   CO2 22 - 32 mmol/L 19   17  Calcium 8.9 - 10.3 mg/dL 8.0   7.8   Total Protein 6.5 - 8.1 g/dL 4.6     Total Bilirubin 0.3 - 1.2 mg/dL 0.8     Alkaline Phos 38 - 126 U/L 409     AST 15 - 41 U/L 28     ALT 0 - 44 U/L 53      Last 3 CBGs 131, 68, 77 GI panel, C. difficile pending GGT 368 Surgical pathology from EGD pending Urine protein to creatinine ratio pending  Assessment/Plan:   Patient Summary: Alicia [name] is a 71 y.o. with a pertinent PMH of HFpEF, HTN, DMII, CKD4, PAD, prior CVA, who presented with progressive weakness and post-prandial abdominal pain and admitted for decreased functional ability, diffuse abdominal pain, and chronic diarrhea likely multifactorial in etiology due to pancreatic insufficiency, duodenal ulcers.  Still taking in very little p.o. intake and unable to sustain herself with this.   #Abdominal Pain of Unkown Etiology #Chronic diarrhea #IMA occlusion #Duodenal ulcers #Pancreatic insufficiency #Lack of PO Intake  #Severe food aversion Continuing  medical management, symptoms are well controlled.  EGD yesterday revealed stable duodenal ulcers without significant improvement over the hospitalization.  Started Protonix infusion and other modifications per GI recommendations.  Cortrak unable to be replaced yesterday following EGD and patient continues to experience multiple frequent hypoglycemic events despite not being on insulin, overall reflective of effective gluconeogenesis and poor prognosis.  Additionally, patient continues to decline p.o. medications and is unable to actually fully benefit from our interventions because of this.  This is especially an issue with Creon which she can only take p.o.  Cortrak also cannot be replaced until tomorrow so patient must be sustained via p.o. intake and IV supplementation today.  Otherwise, would have to consider PEG tube as previously stated. Plan:  - Must increase p.o. intake - GI recommending IV Protonix infusion, liquid diet for 24 more hours, decrease Carafate dose, Bentyl 3 times daily with meals, replace core track when able, trend LFTs, and restrict fluids/consider diuresis.  Stool studies/C. difficile, EGD biopsy results pending - D50 as needed while unable to replace core track - Follow-up palliative recs.  - Need to motivate her to work with PT/OT and increase frequency/intensity of attempts if possible given the prolonged nature of her hospital stay and severe deconditioning. -  restart Imodium for symptomatic relief - We will place core track when able and continue nighttime tube feeds at 60 mL/h - Crestor 10 for possibile IMA pathology.  - schedule reglan '10mg'$  TID WC  - Continue Zyprexa 2.5 mg nightly.  Continue Periactin 2 mg 4 times daily for 6 more days and then uptitrate dose to 4 mg 4 times daily.   - Continue Creon for pancreatic insufficiency.  Needs to be taken p.o., cannot be administered through tube.  #AKI on CKD4 #Hyponatremia #Hyperkalemia CMP collected after multiple  times this morning, creatinine 3.73 today.  She is significantly volume overloaded on exam and has signs of congestive hepatopathy.  Her AKI was initially prerenal, and she is still likely intravascularly depleted, but wonder about cardiorenal versus hepatorenal syndrome at this point.  Might consider initiating soft diuresis if her kidney function stabilizes, though she is at high risk for progression to ESRD.  Appreciate nephrology recs on navigating this. - Appreciate nephrology for assistance. - Avoid IV fluids. - Avoid nephrotoxic medications - Trend CMP  #HFpEF Echo on 03/15/2022 showing severe LVH and grade 2 diastolic dysfunction, aortic sclerosis, mild ascending  aortic dilatation.  She is significantly volume overloaded on exam and is up to 25 L on fluids during the course of his admission, mostly because she had been requiring frequent supplementation with IV fluids for protection of kidney function. - Repeat echo today - Restrict IV fluids   Code: Full Dispo: Pending symptomatic improvement and increased PO intake vs. PEG placement.  Linus Galas MD PGY-1 Internal Medicine Resident Pager: [phone] Please contact the on call pager after 5 pm and on weekends at [phone].

## 2022-09-22 NOTE — Progress Notes (Signed)
Hypoglycemic Event  CBG:   Latest Reference Range & Units 09/22/22 04:12  Glucose-Capillary 70 - 99 mg/dL 68 (L)  (L): Data is abnormally low  Treatment: 1 ampule of Dextrose 50% solution (12.5 g)  Symptoms: None  Follow-up CBG:   Latest Reference Range & Units 09/22/22 05:22  Glucose-Capillary 70 - 99 mg/dL 131 (H)  (H): Data is abnormally high  Possible Reasons for Event: Inadequate meal intake  Comments/MD notified: Notified Galen Manila, MD. Per MD order, administered 1 ampule of Dextrose 50% solution     Sharmon Revere, RN

## 2022-09-23 ENCOUNTER — Inpatient Hospital Stay (HOSPITAL_COMMUNITY): Payer: Medicare HMO

## 2022-09-23 DIAGNOSIS — E46 Unspecified protein-calorie malnutrition: Secondary | ICD-10-CM

## 2022-09-23 DIAGNOSIS — N179 Acute kidney failure, unspecified: Secondary | ICD-10-CM | POA: Diagnosis not present

## 2022-09-23 DIAGNOSIS — Z515 Encounter for palliative care: Secondary | ICD-10-CM | POA: Diagnosis not present

## 2022-09-23 DIAGNOSIS — I509 Heart failure, unspecified: Secondary | ICD-10-CM | POA: Diagnosis not present

## 2022-09-23 DIAGNOSIS — R1084 Generalized abdominal pain: Secondary | ICD-10-CM | POA: Diagnosis not present

## 2022-09-23 DIAGNOSIS — E1110 Type 2 diabetes mellitus with ketoacidosis without coma: Secondary | ICD-10-CM | POA: Diagnosis not present

## 2022-09-23 DIAGNOSIS — K269 Duodenal ulcer, unspecified as acute or chronic, without hemorrhage or perforation: Secondary | ICD-10-CM | POA: Diagnosis not present

## 2022-09-23 DIAGNOSIS — D649 Anemia, unspecified: Secondary | ICD-10-CM | POA: Diagnosis not present

## 2022-09-23 DIAGNOSIS — N390 Urinary tract infection, site not specified: Secondary | ICD-10-CM | POA: Diagnosis not present

## 2022-09-23 DIAGNOSIS — Z4682 Encounter for fitting and adjustment of non-vascular catheter: Secondary | ICD-10-CM | POA: Diagnosis not present

## 2022-09-23 DIAGNOSIS — K8689 Other specified diseases of pancreas: Secondary | ICD-10-CM | POA: Diagnosis not present

## 2022-09-23 DIAGNOSIS — K529 Noninfective gastroenteritis and colitis, unspecified: Secondary | ICD-10-CM | POA: Diagnosis not present

## 2022-09-23 DIAGNOSIS — E876 Hypokalemia: Secondary | ICD-10-CM | POA: Diagnosis not present

## 2022-09-23 DIAGNOSIS — N184 Chronic kidney disease, stage 4 (severe): Secondary | ICD-10-CM | POA: Diagnosis not present

## 2022-09-23 DIAGNOSIS — I130 Hypertensive heart and chronic kidney disease with heart failure and stage 1 through stage 4 chronic kidney disease, or unspecified chronic kidney disease: Secondary | ICD-10-CM | POA: Diagnosis not present

## 2022-09-23 DIAGNOSIS — R531 Weakness: Secondary | ICD-10-CM | POA: Diagnosis not present

## 2022-09-23 DIAGNOSIS — E8809 Other disorders of plasma-protein metabolism, not elsewhere classified: Secondary | ICD-10-CM | POA: Diagnosis not present

## 2022-09-23 DIAGNOSIS — R638 Other symptoms and signs concerning food and fluid intake: Secondary | ICD-10-CM | POA: Diagnosis not present

## 2022-09-23 LAB — GASTROINTESTINAL PANEL BY PCR, STOOL (REPLACES STOOL CULTURE)

## 2022-09-23 LAB — GLUCOSE, CAPILLARY
Glucose-Capillary: 113 mg/dL — ABNORMAL HIGH (ref 70–99)
Glucose-Capillary: 73 mg/dL (ref 70–99)
Glucose-Capillary: 79 mg/dL (ref 70–99)
Glucose-Capillary: 80 mg/dL (ref 70–99)
Glucose-Capillary: 86 mg/dL (ref 70–99)
Glucose-Capillary: 93 mg/dL (ref 70–99)

## 2022-09-23 LAB — CLOSTRIDIUM DIFFICILE BY PCR, REFLEXED: Toxigenic C. Difficile by PCR: POSITIVE — AB

## 2022-09-23 LAB — SURGICAL PATHOLOGY

## 2022-09-23 MED ORDER — TORSEMIDE 20 MG PO TABS: 20.0000 mg | ORAL_TABLET | Freq: Every day | ORAL | Status: DC

## 2022-09-23 MED ORDER — TORSEMIDE 20 MG PO TABS
20.0000 mg | ORAL_TABLET | Freq: Every day | ORAL | Status: DC
  Administered 2022-09-24 – 2022-09-26 (×3): 20 mg via ORAL
  Filled 2022-09-23 (×3): qty 1

## 2022-09-23 MED ORDER — PROSOURCE TF20 ENFIT COMPATIBL EN LIQD
60.0000 mL | Freq: Every day | ENTERAL | Status: DC
  Administered 2022-09-24 – 2022-10-01 (×8): 60 mL
  Filled 2022-09-23 (×8): qty 60

## 2022-09-23 MED ORDER — OSMOLITE 1.5 CAL PO LIQD
1000.0000 mL | ORAL | Status: DC
  Administered 2022-09-23: 1000 mL
  Filled 2022-09-23: qty 1185

## 2022-09-23 NOTE — Progress Notes (Signed)
Subjective:   Summary: Alicia [name] is a 71 y.o. year old female currently admitted on the IMTS HD#29 for diffuse abdominal pain as well as severely decreased PO intake likely multifactorial in etiology due to pancreatic insufficiency, chronic IMA occlusion, duodenal ulcers.  Overnight Events:  - no episodes of hypoglycemia overnight -c dif antigen positive, toxin negative, further working up -cortrak pending -States that she has more of an appetite for food today.  Symptoms well controlled. - Discussed the possibility of likely GJ placement in the next few days if p.o. intake does not improve.  She declines at this point, but will need to continue this discussion and make it clear that if she elects not to get GJ and still has poor p.o. intake, palliative care might be the only remaining option as she has not shown significant improvement despite 30 days of medical management at this point.  Objective:  Vital signs in last 24 hours: Vitals:   09/22/22 1608 09/22/22 2037 09/23/22 0500 09/23/22 0639  BP: 125/69 128/70  (!) 140/78  Pulse: 88   82  Resp: 18 16    Temp: 97.6 F (36.4 C) 97.6 F (36.4 C)    TempSrc: Oral Oral    SpO2: 97%   95%  Weight:   116.8 kg   Height:       Supplemental O2: Room Air    Physical Exam:  Constitutional: NAD HEENT:  Abdominal: minimal diffuse TTP, no guarding or rebound.  Distended but soft.  Periumbilical hernia unchanged.  Extremities: 2+ pitting edema of lower extremities bilaterally Neuro/Psych: Affect more labile today.  Endorses more of an appetite.   Intake/Output Summary (Last 24 hours) at 09/23/2022 0735 Last data filed at 09/22/2022 2251 Gross per 24 hour  Intake 288.46 ml  Output 225 ml  Net 63.46 ml    Net IO Since Admission: 25,865.24 mL [09/23/22 0735]  Pertinent Labs:    Latest Ref Rng & Units 09/21/2022    1:13 PM 09/19/2022    7:38 AM 09/17/2022   10:57 AM  CBC  WBC 4.0 - 10.5 K/uL  9.1   9.4   Hemoglobin 12.0 - 15.0 g/dL 12.6  11.4  10.3   Hematocrit 36.0 - 46.0 % 37.0  34.1  29.2   Platelets 150 - 400 K/uL  330  259        Latest Ref Rng & Units 09/22/2022   10:26 AM 09/21/2022    1:13 PM 09/20/2022    4:43 AM  CMP  Glucose 70 - 99 mg/dL 76  68  217   BUN 8 - 23 mg/dL 66  55  65   Creatinine 0.44 - 1.00 mg/dL 3.73  3.60  3.82   Sodium 135 - 145 mmol/L 134  130  130   Potassium 3.5 - 5.1 mmol/L 3.8  4.3  5.5   Chloride 98 - 111 mmol/L 100  95  98   CO2 22 - 32 mmol/L 19   17   Calcium 8.9 - 10.3 mg/dL 8.0   7.8   Total Protein 6.5 - 8.1 g/dL 4.6     Total Bilirubin 0.3 - 1.2 mg/dL 0.8     Alkaline Phos 38 - 126 U/L 409     AST 15 - 41 U/L 28     ALT 0 - 44 U/L 53     C-dif antigen positive, toxin  negative, confirmatory testing pending Last 3 CBGs 80, 73, 80 GI panel negative Surgical pathology from EGD pending Urine protein to creatinine ratio pending  Imaging:  IMPRESSIONS     1. Left ventricular ejection fraction, by estimation, is 65 to 70%. The  left ventricle has normal function. The left ventricle has no regional  wall motion abnormalities. There is moderate concentric left ventricular  hypertrophy. Left ventricular  diastolic parameters are consistent with Grade I diastolic dysfunction  (impaired relaxation).   2. Right ventricular systolic function is normal. The right ventricular  size is normal. Tricuspid regurgitation signal is inadequate for assessing  PA pressure.   3. The mitral valve is normal in structure. No evidence of mitral valve  regurgitation. No evidence of mitral stenosis.   4. The aortic valve is tricuspid. There is moderate calcification of the  aortic valve. Aortic valve regurgitation is trivial. Aortic valve  sclerosis/calcification is present, without any evidence of aortic  stenosis. Aortic regurgitation PHT measures 747   msec. Aortic valve area, by VTI measures 2.22 cm. Aortic valve mean  gradient measures 5.0 mmHg.  Aortic valve Vmax measures 1.57 m/s.   5. Aortic dilatation noted. There is mild dilatation of the ascending  aorta, measuring 38 mm.   Assessment/Plan:   Patient Summary: Alicia [name] is a 71 y.o. with a pertinent PMH of HFpEF, HTN, DMII, CKD4, PAD, prior CVA, who presented with progressive weakness and post-prandial abdominal pain and admitted for decreased functional ability, diffuse abdominal pain, and chronic diarrhea likely multifactorial in etiology due to pancreatic insufficiency, duodenal ulcers.  Still taking in very little p.o. intake and unable to sustain herself with this.   #Abdominal Pain of Unkown Etiology #Chronic diarrhea #IMA occlusion #Duodenal ulcers #Pancreatic insufficiency #Lack of PO Intake  #Severe food aversion Continuing medical management, symptoms are well controlled, appreciate consultant recommendations.  C. difficile antigen was positive yesterday, but toxin negative.  Clinically she does not appear to have C. difficile colitis, but will follow-up confirmatory testing.  Cortrak should be replaced today.  Blood glucoses are low but stable overnight, insulin likely excreted from system after stopping 2 nights ago.  Overall, not much improvement in p.o. intake, and we are reaching the extent of benefit of hospitalization, with worsening deconditioning and infection risk.  Think we are at the point where we need to introduce term enteral feeding with GJ if p.o. intake does not dramatically improve.  Need to continue ongoing goals of care discussions with the patient and have a concrete plan for [facility] placement if we do go down this road.  Patient does not seem to want GJ, but need to be clear with her that if she declines long-term enteral feeding and her p.o. intake does not improve, she will likely pass within days to weeks on palliative/comfort care and reevaluate her decision making.  Need to involve family in this as well, appreciate palliative care  recs. Plan:  - Must increase p.o. intake - GI recommending: IV Protonix infusion will be stopped later today and she will transition to IV Protonix 40 mg twice daily, and transition to full diet today, continue decreased Carafate dose for 5 more days, Bentyl 3 times daily with meals.  Confirmatory C. difficile testing pending, but very low clinical suspicion of C. difficile colitis at this point. EGD biopsy results pending. - D50 as needed.  We will replace cortrak today and start continuous tube feeds to meet her nutritional needs. - Follow-up palliative recs.  At the point where p.o. intake needs to increase or we need to place long-term enteral feeding tube, likely GJ - Need to motivate her to work with PT/OT and increase frequency/intensity of attempts if possible given the prolonged nature of her hospital stay and severe deconditioning. -  restart Imodium for symptomatic relief - Crestor 10 for possibile IMA pathology.  - schedule reglan '10mg'$  TID WC  - Continue Zyprexa 2.5 mg nightly.  Continue Periactin 2 mg 4 times daily for 5 more days and then uptitrate dose to 4 mg 4 times daily.   - Continue Creon for pancreatic insufficiency.  Needs to be taken p.o., cannot be administered through tube.  #AKI on CKD4 #Hyponatremia #Hyperkalemia CMP unable to be collected this morning.  She is significantly volume overloaded on exam and has signs of congestive hepatopathy.  She is not previously benefited significantly from IV fluids and is now very volume overloaded.  We will likely initiate soft diuresis with torsemide 20 mg daily after restarting her on tube feeds when she is more stable.  Appreciate nephrology recs on navigating this. - Appreciate nephrology for assistance. - Avoid IV fluids. - Avoid nephrotoxic medications - Trend CMP  #HFpEF Repeat echo shows EF 65 to 70% and grade 1 diastolic dysfunction as well as aortic sclerosis with mild regurgitation.  She is significantly volume  overloaded on exam and is up to 25 L on fluids during the course of his admission, mostly because she had been requiring frequent supplementation with IV fluids for protection of kidney function.  Will likely initiate soft diuresis with torsemide when her nutrition is restarted. - Restrict IV fluids - Start torsemide 20 mg daily after restarting tube feeds.  Code: Full Dispo: Pending symptomatic improvement and increased PO intake vs. PEG placement.  Linus Galas MD PGY-1 Internal Medicine Resident Pager: [phone] Please contact the on call pager after 5 pm and on weekends at [phone].

## 2022-09-23 NOTE — Progress Notes (Signed)
[facility] Progress Note   Assessment/ Plan:   AKI on CKD 4: BL possibly 2.5-3 but fluctuates. Previously was followed by Dr Joylene Grapes  - main issue looks like no Po intake--> now with urinary retention on top of everything  - stop IVFs with pleural effusions  - Foley in place now  - dont' expect this BUN to cause uremia- no indication for RRT  - dialysis would not be of utility in this setting- she appears profoundly depressed too but with some duodenal ulcers (which could be contributing to food aversion but not the whole picture) and if she's not eating dialysis essentially prolongs death by starvation.    - Zyprexa replaced remeron recently  - needs palliative care c/s, appreciate assistance  - only 30 mg protein on last UA, will send UP/C to be sure  - would start with torsemide 20 mg daily  Depression:  - Zyprexa  - already tried remeron without much improvement  Abd pain: unknown etiology. NG tube in place. Extensive workup.  - endoscopy 11/1- duodenal ulcers, carafate reduced, on PPI   Elevated LFTs:  - per GI  - reorder TTE- pending    HTN: BP slightly high. Cont current meds  Subjective:    Seen in room. Still has some swelling.  Will start torsemide 20 mg daily.   Objective:   BP (!) 141/69 (BP Location: Left Arm)   Pulse 84   Temp 98.5 F (36.9 C)   Resp 19   Ht '5\' 8"'$  (1.727 m)   Wt 116.8 kg   SpO2 100%   BMI 39.15 kg/m   Intake/Output Summary (Last 24 hours) at 09/23/2022 1458 Last data filed at 09/22/2022 2251 Gross per 24 hour  Intake 168.46 ml  Output 75 ml  Net 93.46 ml   Weight change:   Physical Exam: IOE:VOJJ affect, lygin in bed CVS:RRR Resp: clear KKX:FGHW Ext: 1+ LE edema  Imaging: DG Abd Portable 1V  Result Date: 09/23/2022 CLINICAL DATA:  Feeding tube placement. EXAM: PORTABLE ABDOMEN - 1 VIEW COMPARISON:  CT 09/20/2022 FINDINGS: Tip of the weighted enteric tube is below the stomach, the tip is in the right upper  quadrant. Feeding tube tip is in the region of the distal stomach. Few prominent loops of small bowel in the right abdomen are new from recent CT. There is contrast in the colon from prior CT. IMPRESSION: 1. Tip of the weighted enteric tube in the right upper quadrant, in the region of distal stomach. 2. Few prominent small bowel loops in the right abdomen, new from recent CT, likely represent ileus. Electronically Signed   By: Keith Rake M.D.   On: 09/23/2022 13:14   ECHOCARDIOGRAM COMPLETE  Result Date: 09/22/2022    ECHOCARDIOGRAM REPORT   Patient Name:   Alicia [name] Date of Exam: 09/22/2022 Medical Rec #:  [id number]            Height:       68.0 in Accession #:    [id number]           Weight:       259.0 lb Date of Birth:  04/16/1951            BSA:          2.281 m Patient Age:    71 years             BP:           135/93 mmHg Patient Gender: F  HR:           80 bpm. Exam Location:  Inpatient Procedure: 2D Echo, Cardiac Doppler and Color Doppler Indications:    CHF  History:        Patient has prior history of Echocardiogram examinations, most                 recent 03/15/2022. Stroke; Risk Factors:Diabetes, Dyslipidemia                 and Hypertension.  Sonographer:    Memory Argue Referring Phys: [id number] Tyra Gural Fritch  1. Left ventricular ejection fraction, by estimation, is 65 to 70%. The left ventricle has normal function. The left ventricle has no regional wall motion abnormalities. There is moderate concentric left ventricular hypertrophy. Left ventricular diastolic parameters are consistent with Grade I diastolic dysfunction (impaired relaxation).  2. Right ventricular systolic function is normal. The right ventricular size is normal. Tricuspid regurgitation signal is inadequate for assessing PA pressure.  3. The mitral valve is normal in structure. No evidence of mitral valve regurgitation. No evidence of mitral stenosis.  4. The aortic valve is  tricuspid. There is moderate calcification of the aortic valve. Aortic valve regurgitation is trivial. Aortic valve sclerosis/calcification is present, without any evidence of aortic stenosis. Aortic regurgitation PHT measures 747  msec. Aortic valve area, by VTI measures 2.22 cm. Aortic valve mean gradient measures 5.0 mmHg. Aortic valve Vmax measures 1.57 m/s.  5. Aortic dilatation noted. There is mild dilatation of the ascending aorta, measuring 38 mm. FINDINGS  Left Ventricle: Left ventricular ejection fraction, by estimation, is 65 to 70%. The left ventricle has normal function. The left ventricle has no regional wall motion abnormalities. The left ventricular internal cavity size was normal in size. There is  moderate concentric left ventricular hypertrophy. Left ventricular diastolic parameters are consistent with Grade I diastolic dysfunction (impaired relaxation). Normal left ventricular filling pressure. Right Ventricle: The right ventricular size is normal. No increase in right ventricular wall thickness. Right ventricular systolic function is normal. Tricuspid regurgitation signal is inadequate for assessing PA pressure. Left Atrium: Left atrial size was normal in size. Right Atrium: Right atrial size was normal in size. Pericardium: Trivial pericardial effusion is present. The pericardial effusion is posterior to the left ventricle. Mitral Valve: The mitral valve is normal in structure. No evidence of mitral valve regurgitation. No evidence of mitral valve stenosis. Tricuspid Valve: The tricuspid valve is normal in structure. Tricuspid valve regurgitation is trivial. No evidence of tricuspid stenosis. Aortic Valve: The aortic valve is tricuspid. There is moderate calcification of the aortic valve. Aortic valve regurgitation is trivial. Aortic regurgitation PHT measures 747 msec. Aortic valve sclerosis/calcification is present, without any evidence of aortic stenosis. Aortic valve mean gradient measures  5.0 mmHg. Aortic valve peak gradient measures 9.9 mmHg. Aortic valve area, by VTI measures 2.22 cm. Pulmonic Valve: The pulmonic valve was normal in structure. Pulmonic valve regurgitation is not visualized. No evidence of pulmonic stenosis. Aorta: Aortic dilatation noted. There is mild dilatation of the ascending aorta, measuring 38 mm. Venous: The inferior vena cava was not well visualized. IAS/Shunts: No atrial level shunt detected by color flow Doppler.  LEFT VENTRICLE PLAX 2D LVIDd:         3.50 cm   Diastology LVIDs:         2.30 cm   LV e' medial:    5.13 cm/s LV PW:         1.40 cm  LV E/e' medial:  7.5 LV IVS:        1.40 cm   LV e' lateral:   4.51 cm/s LVOT diam:     2.00 cm   LV E/e' lateral: 8.6 LV SV:         53 LV SV Index:   23 LVOT Area:     3.14 cm  RIGHT VENTRICLE TAPSE (M-mode): 2.0 cm LEFT ATRIUM             Index        RIGHT ATRIUM           Index LA diam:        3.10 cm 1.36 cm/m   RA Area:     12.00 cm LA Vol (A2C):   48.8 ml 21.37 ml/m  RA Volume:   23.10 ml  10.13 ml/m LA Vol (A4C):   42.7 ml 18.72 ml/m LA Biplane Vol: 44.9 ml 19.68 ml/m  AORTIC VALVE AV Area (Vmax):    2.10 cm AV Area (Vmean):   1.97 cm AV Area (VTI):     2.22 cm AV Vmax:           157.00 cm/s AV Vmean:          103.000 cm/s AV VTI:            0.241 m AV Peak Grad:      9.9 mmHg AV Mean Grad:      5.0 mmHg LVOT Vmax:         105.00 cm/s LVOT Vmean:        64.600 cm/s LVOT VTI:          0.170 m LVOT/AV VTI ratio: 0.71 AI PHT:            747 msec  AORTA Ao Root diam: 3.10 cm Ao Asc diam:  3.80 cm MITRAL VALVE MV Area (PHT): 3.12 cm    SHUNTS MV Decel Time: 243 msec    Systemic VTI:  0.17 m MV E velocity: 38.60 cm/s  Systemic Diam: 2.00 cm MV A velocity: 77.10 cm/s MV E/A ratio:  0.50 Fransico Him MD Electronically signed by Fransico Him MD Signature Date/Time: 09/22/2022/4:05:29 PM    Final     Labs: BMET Recent Labs  Lab 09/17/22 1057 09/18/22 0338 09/19/22 0738 09/20/22 0443 09/20/22 1012  09/21/22 1313 09/22/22 1026  NA 129* 132* 133* 130*  --  130* 134*  K 6.0* 4.5 4.4 5.5*  --  4.3 3.8  CL 99 103 99 98  --  95* 100  CO2 19* 18* 18* 17*  --   --  19*  GLUCOSE 137* 103* 215* 217*  --  68* 76  BUN 56* 57* 61* 65*  --  55* 66*  CREATININE 3.53* 3.42* 3.63* 3.82*  --  3.60* 3.73*  CALCIUM 7.6* 7.7* 8.1* 7.8*  --   --  8.0*  PHOS  --   --   --   --  3.6  --   --    CBC Recent Labs  Lab 09/17/22 1057 09/19/22 0738 09/21/22 1313  WBC 9.4 9.1  --   HGB 10.3* 11.4* 12.6  HCT 29.2* 34.1* 37.0  MCV 92.4 95.8  --   PLT 259 330  --     Medications:     acetaminophen  650 mg Rectal Q6H   Or   acetaminophen (TYLENOL) oral liquid 160 mg/5 mL  1,000 mg Oral Q6H   amLODipine  10  mg Oral Daily   cyproheptadine  2 mg Per Tube QID   dicyclomine  10 mg Oral TID AC & HS   DULoxetine  30 mg Oral BID   enoxaparin (LOVENOX) injection  30 mg Subcutaneous Q24H   feeding supplement  237 mL Oral TID BM   feeding supplement (PROSource TF20)  60 mL Per Tube Daily   folic acid  1 mg Oral Daily   free water  200 mL Per Tube Q4H   lipase/protease/amylase  24,000 Units Oral TID   metoCLOPramide  10 mg Oral TID AC   metoprolol tartrate  50 mg Oral BID   multivitamin  1 tablet Per Tube QHS   OLANZapine zydis  2.5 mg Oral QHS   mouth rinse  15 mL Mouth Rinse 4 times per day   pantoprazole  40 mg Intravenous Q12H   rosuvastatin  10 mg Per Tube QPM   sodium bicarbonate  650 mg Oral BID   sucralfate  1 g Oral BID   thiamine  100 mg Per Tube Daily    Madelon Lips MD 09/23/2022, 2:58 PM

## 2022-09-23 NOTE — Procedures (Addendum)
Cortrak  Person Inserting Tube:  Ranell Patrick D, RD Tube Type:  Cortrak - 43 inches Tube Size:  10 Tube Location:  Left nare Secured by: Bridle Technique Used to Measure Tube Placement:  Marking at nare/corner of mouth Cortrak Secured At:  68 cm  Cortrak Tube Team Note:  Consult received to place a Cortrak feeding tube.   X-ray is required, abdominal x-ray has been ordered by the Cortrak team. Please confirm tube placement before using the Cortrak tube.   If the tube becomes dislodged please keep the tube and contact the Cortrak team at [email or web address] for replacement.  If after hours and replacement cannot be delayed, place a NG tube and confirm placement with an abdominal x-ray.    Ranell Patrick, RD, LDN Clinical Dietitian RD pager # available in AMION  After hours/weekend pager # available in [facility]

## 2022-09-23 NOTE — TOC Progression Note (Signed)
Transition of Care [facility] - Initial/Assessment Note    Patient Details  Name: Alicia [name] MRN: [id number] Date of Birth: March 13, 1951  Transition of Care [facility] CM/SW Contact:    Milinda Antis, [place] Phone Number: 09/23/2022, 2:42 PM  Clinical Narrative:                 Patient still has cortrak which is a barrier to SNF placement.   LCSW will continue to follow and restart SNF search once patient is closer to being medically ready.   Expected Discharge Plan: Skilled Nursing Facility Barriers to Discharge: Insurance Authorization, SNF Pending bed offer, Continued Medical Work up   Patient Goals and CMS Choice Patient states their goals for this hospitalization and ongoing recovery are:: Pt wants to be able to return home. CMS Medicare.gov Compare Post Acute Care list provided to:: Patient Choice offered to / list presented to : Patient  Expected Discharge Plan and Services Expected Discharge Plan: [place] Acute Care Choice: [place] arrangements for the past 2 months: Single Family Home                                      Prior Living Arrangements/Services Living arrangements for the past 2 months: Single Family Home Lives with:: Self Patient language and need for interpreter reviewed:: Yes Do you feel safe going back to the place where you live?: Yes      Need for Family Participation in Patient Care: Yes (Comment) Care giver support system in place?: No (comment)   Criminal Activity/Legal Involvement Pertinent to Current Situation/Hospitalization: No - Comment as needed  Activities of Daily Living Home Assistive Devices/Equipment: Walker (specify type) ADL Screening (condition at time of admission) Patient's cognitive ability adequate to safely complete daily activities?: Yes Is the patient deaf or have difficulty hearing?: No Does the patient have difficulty seeing, even when wearing  glasses/contacts?: No Does the patient have difficulty concentrating, remembering, or making decisions?: No Patient able to express need for assistance with ADLs?: Yes Does the patient have difficulty dressing or bathing?: Yes Independently performs ADLs?: No Communication: Independent Dressing (OT): Needs assistance Is this a change from baseline?: Pre-admission baseline Grooming: Needs assistance Is this a change from baseline?: Pre-admission baseline Feeding: Independent Bathing: Needs assistance Is this a change from baseline?: Pre-admission baseline Toileting: Independent with device (comment) In/Out Bed: Independent with device (comment) Walks in Home: Independent with device (comment) Does the patient have difficulty walking or climbing stairs?: Yes Weakness of Legs: Both Weakness of Arms/Hands: None  Permission Sought/Granted Permission sought to share information with : Family Supports Permission granted to share information with : No              Emotional Assessment Appearance:: Appears stated age Attitude/Demeanor/Rapport: Guarded Affect (typically observed): Flat Orientation: : Oriented to Self, Oriented to Place, Oriented to  Time, Oriented to Situation Alcohol / Substance Use: Not Applicable Psych Involvement: No (comment)  Admission diagnosis:  Generalized weakness [R53.1] Patient Active Problem List   Diagnosis Date Noted   Food aversion 09/09/2022   Hypoalbuminemia due to protein-calorie malnutrition ([place]) 09/09/2022   Duodenal ulcer 09/01/2022   Mucosal abnormality of stomach 09/01/2022   Mucosal abnormality of esophagus 09/01/2022   Chronic generalized abdominal pain 08/30/2022   Nausea and vomiting    Diarrhea    Pressure injury of skin 08/27/2022  Generalized weakness 08/24/2022   Acute exacerbation of congestive heart failure ([place]) 06/30/2022   Hypernatremia 06/30/2022   Hyperkalemia 06/17/2022   Normocytic anemia 06/17/2022   Intertrigo     Metabolic acidosis    Cellulitis 05/20/2022   Vitamin D deficiency 05/20/2022   Headache 02/16/2022   Disequilibrium 12/01/2021   (HFpEF) heart failure with preserved ejection fraction ([place]) 07/18/2021   Exacerbation of RAD (reactive airway disease) 02/27/2020   Acute worsening of stage 4 chronic kidney disease ([place]) 02/27/2020   Tobacco use 02/27/2020   Prolonged Q-T interval on ECG 04/27/2018   Diabetes mellitus type 2 in obese ([place]) 03/16/2018   Severe obesity (BMI >= 40) (HCC)    CVA (cerebral vascular accident) ([place]) 03/15/2018   Peripheral vascular disease, unspecified ([place]) 11/30/2012   Hyperlipidemia 07/20/2007   Primary hypertension 07/20/2007   PCP:  Rick Duff, MD Pharmacy:   [facility] ([place]), [place] [address] [place] ([place]) [place] [address] Phone: [phone] Fax: [phone]     Social Determinants of Health (SDOH) Interventions    Readmission Risk Interventions    03/02/2020    1:08 PM  Readmission Risk Prevention Plan  Transportation Screening Complete  PCP or Specialist Appt within 5-7 Days Complete  Home Care Screening Complete  Medication Review (RN CM) Complete

## 2022-09-23 NOTE — Progress Notes (Signed)
Occupational Therapy Treatment Patient Details Name: LATEIA FRASER MRN: [id number] DOB: 1951/04/01 Today's Date: 09/23/2022   History of present illness 71 y.o. female presented to ED 10/4 with FTT. Cortrak placed 10/18 PMhx: CHf, HTN, CVA, T2DM, CKD, vit D deficiency, PAD, depression, HFpEF   OT comments  Patient continues to be more participatory and talkative.  Rolling side to side with Min A to the right, and mod A to the left.  Given increasing willingness, OT will continue efforts in the acute setting.  Frequent stools and remaining deficits listed below, do impact treatment.  SNF for post acute rehab trial and possibly LTC.   Recommendations for follow up therapy are one component of a multi-disciplinary discharge planning process, led by the attending physician.  Recommendations may be updated based on patient status, additional functional criteria and insurance authorization.    Follow Up Recommendations  Skilled nursing-short term rehab (<3 hours/day)    Assistance Recommended at Discharge Frequent or constant Supervision/Assistance  Patient can return home with the following  Two people to help with walking and/or transfers;Help with stairs or ramp for entrance;Direct supervision/assist for medications management;Assist for transportation;Assistance with feeding;A lot of help with bathing/dressing/bathroom   Equipment Recommendations  Wheelchair (measurements OT);Wheelchair cushion (measurements OT);BSC/3in1;Tub/shower seat;Hospital bed    Recommendations for Other Services      Precautions / Restrictions Precautions Precautions: Fall Precaution Comments: Skin integrity, incontinent of bowel Restrictions Weight Bearing Restrictions: No       Mobility Bed Mobility Overal bed mobility: Needs Assistance Bed Mobility: Rolling Rolling: Min assist, Mod assist              Transfers                         Balance                                            ADL either performed or assessed with clinical judgement   ADL   Eating/Feeding: Moderate assistance;Bed level Eating/Feeding Details (indicate cue type and reason): full liquid Grooming: Wash/dry hands;Wash/dry face;Bed level;Moderate assistance       Lower Body Bathing: Total assistance;Bed level                                                              Cognition Arousal/Alertness: Awake/alert Behavior During Therapy: WFL for tasks assessed/performed Overall Cognitive Status: Within Functional Limits for tasks assessed                                          Exercises General Exercises - Upper Extremity Shoulder Flexion: AAROM, Both, 10 reps, Supine, Other (comment) Shoulder Extension: AAROM, Both, 10 reps, Supine, Other (comment) Shoulder ABduction: AAROM, Both, 10 reps, Supine, Other (comment) Shoulder ADduction: AAROM, Both, 10 reps, Supine, Other (comment) Elbow Flexion: AROM, Both, 10 reps, Supine Elbow Extension: AAROM, 10 reps, Supine Composite Extension: AROM, Both, 15 reps Chair Push Up: AROM, Both, 15 reps    Shoulder Instructions       General Comments  Pertinent Vitals/ Pain       Pain Assessment Pain Assessment: Faces Faces Pain Scale: Hurts a little bit Pain Location: LR shoulder Pain Descriptors / Indicators: Grimacing Pain Intervention(s): Monitored during session                                                          Frequency  Min 2X/week        Progress Toward Goals  OT Goals(current goals can now be found in the care plan section)     Acute Rehab OT Goals OT Goal Formulation: With patient Time For Goal Achievement: 09/28/22 Potential to Achieve Goals: Fair ADL Goals Pt Will Perform Eating: with min assist;bed level  Plan Discharge plan remains appropriate    Co-evaluation                 AM-PAC OT "6 Clicks" Daily  Activity     Outcome Measure   Help from another person eating meals?: A Lot Help from another person taking care of personal grooming?: A Lot Help from another person toileting, which includes using toliet, bedpan, or urinal?: Total Help from another person bathing (including washing, rinsing, drying)?: A Lot Help from another person to put on and taking off regular upper body clothing?: A Lot Help from another person to put on and taking off regular lower body clothing?: Total 6 Click Score: 10    End of Session Equipment Utilized During Treatment: Gait belt  OT Visit Diagnosis: Unsteadiness on feet (R26.81);Muscle weakness (generalized) (M62.81);History of falling (Z91.81)   Activity Tolerance Patient tolerated treatment well   Patient Left in bed;with call bell/phone within reach   Nurse Communication Mobility status        Time: 1352-1410 OT Time Calculation (min): 18 min  Charges: OT General Charges $OT Visit: 1 Visit OT Treatments $Therapeutic Activity: 8-22 mins  09/23/2022  RP, OTR/L  Acute Rehabilitation Services  Office:  [phone]   Metta Clines 09/23/2022, 2:54 PM

## 2022-09-23 NOTE — Care Management Important Message (Signed)
Important Message  Patient Details  Name: Alicia [name] MRN: [id number] Date of Birth: [birth year]   Medicare Important Message Given:  Yes     [name] 09/23/2022, 11:27 AM

## 2022-09-23 NOTE — Progress Notes (Signed)
Brief Nutrition Note  Cortrak placed today by Cortrak team. Abdominal x-ray showing feeding tube tip in the distal stomach. Abdominal x-ray also showing "few prominent small bowel loops in the right abdomen, new from recent CT, likely represent ileus."  Discussed pt with MD. Faythe Ghee to start tube feeds via gastric Cortrak but do not advance past 20 ml/hr. MD to evaluate for ability to advance tube feeds tomorrow, 09/24/22.  Later MD requesting Cortrak team return to pt's room to advance Cortrak into post-pyloric position. Cortrak RD was able to advance tube but it would not easily flush. Tube was left in furthest position where it would still flush without resistance. Per abdominal x-ray, enteral tube tip coiled within the expected location of the distal stomach.  RD to order tube feeds: - Osmolite 1.5 @ 20 ml/hr (480 ml/day) - PROSource TF20 60 ml daily   Will monitor for ability to advance tube feeding regimen to goal: - Osmolite 1.5 @ 50 ml/hr (1200 ml/day) - PROSource TF20 60 ml daily - Free water flushes per MD, currently 200 ml q 4 hours  Tube feeding regimen at recommended goal rate with current free water flushes would provide 1880 kcal, 95 grams of protein, and 2114 ml of H2O.  RD will continue to follow pt during admission.   Gustavus Bryant, MS, RD, LDN Inpatient Clinical Dietitian Please see AMiON for contact information.

## 2022-09-24 DIAGNOSIS — D649 Anemia, unspecified: Secondary | ICD-10-CM | POA: Diagnosis not present

## 2022-09-24 DIAGNOSIS — R531 Weakness: Secondary | ICD-10-CM | POA: Diagnosis not present

## 2022-09-24 DIAGNOSIS — N179 Acute kidney failure, unspecified: Secondary | ICD-10-CM | POA: Diagnosis not present

## 2022-09-24 DIAGNOSIS — I130 Hypertensive heart and chronic kidney disease with heart failure and stage 1 through stage 4 chronic kidney disease, or unspecified chronic kidney disease: Secondary | ICD-10-CM | POA: Diagnosis not present

## 2022-09-24 DIAGNOSIS — I509 Heart failure, unspecified: Secondary | ICD-10-CM | POA: Diagnosis not present

## 2022-09-24 DIAGNOSIS — N390 Urinary tract infection, site not specified: Secondary | ICD-10-CM | POA: Diagnosis not present

## 2022-09-24 DIAGNOSIS — E876 Hypokalemia: Secondary | ICD-10-CM | POA: Diagnosis not present

## 2022-09-24 DIAGNOSIS — N184 Chronic kidney disease, stage 4 (severe): Secondary | ICD-10-CM | POA: Diagnosis not present

## 2022-09-24 DIAGNOSIS — E1110 Type 2 diabetes mellitus with ketoacidosis without coma: Secondary | ICD-10-CM | POA: Diagnosis not present

## 2022-09-24 LAB — RENAL FUNCTION PANEL
Albumin: 1.7 g/dL — ABNORMAL LOW (ref 3.5–5.0)
Anion gap: 15 (ref 5–15)
BUN: 70 mg/dL — ABNORMAL HIGH (ref 8–23)
CO2: 17 mmol/L — ABNORMAL LOW (ref 22–32)
Calcium: 7.6 mg/dL — ABNORMAL LOW (ref 8.9–10.3)
Chloride: 99 mmol/L (ref 98–111)
Creatinine, Ser: 3.85 mg/dL — ABNORMAL HIGH (ref 0.44–1.00)
GFR, Estimated: 12 mL/min — ABNORMAL LOW (ref >60)
Glucose, Bld: 185 mg/dL — ABNORMAL HIGH (ref 70–99)
Phosphorus: 4.8 mg/dL — ABNORMAL HIGH (ref 2.5–4.6)
Potassium: 4 mmol/L (ref 3.5–5.1)
Sodium: 131 mmol/L — ABNORMAL LOW (ref 135–145)

## 2022-09-24 LAB — COMPREHENSIVE METABOLIC PANEL
ALT: 50 U/L — ABNORMAL HIGH (ref 0–44)
AST: 53 U/L — ABNORMAL HIGH (ref 15–41)
Albumin: 1.7 g/dL — ABNORMAL LOW (ref 3.5–5.0)
Alkaline Phosphatase: 382 U/L — ABNORMAL HIGH (ref 38–126)
Anion gap: 14 (ref 5–15)
BUN: 69 mg/dL — ABNORMAL HIGH (ref 8–23)
CO2: 18 mmol/L — ABNORMAL LOW (ref 22–32)
Calcium: 7.6 mg/dL — ABNORMAL LOW (ref 8.9–10.3)
Chloride: 100 mmol/L (ref 98–111)
Creatinine, Ser: 3.99 mg/dL — ABNORMAL HIGH (ref 0.44–1.00)
GFR, Estimated: 11 mL/min — ABNORMAL LOW (ref >60)
Glucose, Bld: 187 mg/dL — ABNORMAL HIGH (ref 70–99)
Potassium: 4 mmol/L (ref 3.5–5.1)
Sodium: 132 mmol/L — ABNORMAL LOW (ref 135–145)
Total Bilirubin: 0.8 mg/dL (ref 0.3–1.2)
Total Protein: 4.5 g/dL — ABNORMAL LOW (ref 6.5–8.1)

## 2022-09-24 LAB — CBC
HCT: 24 % — ABNORMAL LOW (ref 36.0–46.0)
Hemoglobin: 8.1 g/dL — ABNORMAL LOW (ref 12.0–15.0)
MCH: 33.3 pg (ref 26.0–34.0)
MCHC: 33.8 g/dL (ref 30.0–36.0)
MCV: 98.8 fL (ref 80.0–100.0)
Platelets: 345 10*3/uL (ref 150–400)
RBC: 2.43 MIL/uL — ABNORMAL LOW (ref 3.87–5.11)
RDW: 20.4 % — ABNORMAL HIGH (ref 11.5–15.5)
WBC: 8.2 10*3/uL (ref 4.0–10.5)
nRBC: 0 % (ref 0.0–0.2)

## 2022-09-24 LAB — GLUCOSE, CAPILLARY
Glucose-Capillary: 127 mg/dL — ABNORMAL HIGH (ref 70–99)
Glucose-Capillary: 127 mg/dL — ABNORMAL HIGH (ref 70–99)
Glucose-Capillary: 140 mg/dL — ABNORMAL HIGH (ref 70–99)
Glucose-Capillary: 158 mg/dL — ABNORMAL HIGH (ref 70–99)
Glucose-Capillary: 170 mg/dL — ABNORMAL HIGH (ref 70–99)
Glucose-Capillary: 240 mg/dL — ABNORMAL HIGH (ref 70–99)

## 2022-09-24 MED ORDER — INSULIN ASPART 100 UNIT/ML IJ SOLN: 0.0000 [IU] | Freq: Three times a day (TID) | INTRAMUSCULAR | Status: DC

## 2022-09-24 MED ORDER — VANCOMYCIN 50 MG/ML ORAL SOLUTION: 125.0000 mg | Freq: Four times a day (QID) | ORAL | Status: DC

## 2022-09-24 MED ORDER — VANCOMYCIN HCL 125 MG PO CAPS
125.0000 mg | ORAL_CAPSULE | Freq: Four times a day (QID) | ORAL | Status: DC
  Administered 2022-09-24: 125 mg via ORAL
  Filled 2022-09-24: qty 1

## 2022-09-24 MED ORDER — INSULIN ASPART 100 UNIT/ML IJ SOLN: 0.0000 [IU] | Freq: Every day | INTRAMUSCULAR | Status: DC

## 2022-09-24 MED ORDER — INSULIN ASPART 100 UNIT/ML IJ SOLN
0.0000 [IU] | INTRAMUSCULAR | Status: DC
  Administered 2022-09-24: 3 [IU] via SUBCUTANEOUS
  Administered 2022-09-25 (×3): 1 [IU] via SUBCUTANEOUS
  Administered 2022-09-25 (×2): 2 [IU] via SUBCUTANEOUS

## 2022-09-24 NOTE — Progress Notes (Signed)
HD#25 SUBJECTIVE:  Patient Summary: Alicia [name] is a 71 y.o. year old female currently admitted with severe food aversion secondary to multiple etiologies including nonhealing duodenal ulcers, depression, possible gastroparesis, and pancreatic insufficiency that has been refractory to maximal medical therapy.  Overnight Events: Patient had an episode of emesis yesterday. She did ask for grapes to eat and per unit staff she ate them all.  Interim History: Alicia [name] reports feeling overall well this morning. She asked me if I could bring her her denture cases so that she could wear them and wanted the coffee that was on her food tray. She asked to have the head of her bed raised for drinking and eating. She says that she wants to eat her breakfast but is worried that it may be cold. She wants to eat as long as her meal is warm. She reports feeling hopeful for further progress today in her appetite and PO intake.  OBJECTIVE:  Vital Signs: Vitals:   09/23/22 1648 09/23/22 2008 09/24/22 0421 09/24/22 0913  BP: 126/67 (!) 152/53 122/77 (!) 146/77  Pulse: 82 84 79 82  Resp: '18 18 18 15  '$ Temp: 98.5 F (36.9 C) 97.6 F (36.4 C) 98 F (36.7 C) 98.2 F (36.8 C)  TempSrc:  Oral Oral Oral  SpO2:  96% 99% 98%  Weight:      Height:       Supplemental O2: Room Air SpO2: 98 % O2 Flow Rate (L/min): 2 L/min  Filed Weights   09/20/22 0441 09/21/22 0300 09/23/22 0500  Weight: 117.5 kg 117.5 kg 116.8 kg     Intake/Output Summary (Last 24 hours) at 09/24/2022 0926 Last data filed at 09/24/2022 0600 Gross per 24 hour  Intake 3473.59 ml  Output 470 ml  Net 3003.59 ml   Net IO Since Admission: 28,868.83 mL [09/24/22 0926]  Physical Exam: Constitutional:Comfortably resting in bed. In no acute distress. Pulm:Normal work of breathing on room air. Abdomen:Soft, nontender. Periumbilical hernia unchanged Psych:Awake, appropriately engaged in conversation. Endorses increased appetite and  verbalizes desire to eat and drink.   Patient Lines/Drains/Airways Status     Active Line/Drains/Airways     Name Placement date Placement time Site Days   Peripheral IV 09/03/22 20 G 1.88" Anterior;Left;Upper Arm 09/03/22  1639  Arm  21   Urethral Catheter CAndice A, RN Latex 14 Fr. 09/21/22  1150  Latex  3   Small Bore Feeding Tube 10 Fr. Left nare Marking at nare/corner of mouth 68 cm 09/23/22  1147  Left nare  1   Pressure Injury 08/25/22 Sacrum Medial Stage 1 -  Intact skin with non-blanchable redness of a localized area usually over a bony prominence. 08/25/22  2000  -- 30   Wound / Incision (Open or Dehisced) 09/08/22 Non-pressure wound Thigh Left;Posterior;Proximal 1cm by 1cm, non pressure wound 09/08/22  1200  Thigh  16   Wound / Incision (Open or Dehisced) 09/08/22 Non-pressure wound Thigh Posterior;Proximal;Right 1cm by 1cm non pressure wound 09/08/22  1200  Thigh  16             ASSESSMENT/PLAN:  Assessment: Principal Problem:   Chronic generalized abdominal pain Active Problems:   Acute worsening of stage 4 chronic kidney disease (HCC)   Generalized weakness   Pressure injury of skin   Nausea and vomiting   Diarrhea   Duodenal ulcer   Mucosal abnormality of stomach   Mucosal abnormality of esophagus   Food aversion   Hypoalbuminemia due  to protein-calorie malnutrition ([place])   Plan: #Severe food aversion resulting in minimal PO intake #Abdominal Pain of Unkown Etiology #Chronic diarrhea #IMA occlusion #Duodenal ulcers #Pancreatic insufficiency Continuing medical management, symptoms are well controlled, appreciate consultant recommendations.  Cortrak replaced 11/03. CBG have been appropriate. As above, she did verbalize her desire to put her dentures in and drink some coffee. She also verbalized wanting to try to eat her breakfast but was worried that it was cold from sitting in her room; she said that she would eat if the food was warm. These admissions were  unprompted which makes me somewhat hopeful that PO intake will show improvement today. The extent of this hospitalization has caused her to have deconditioning and increased infection risk. She continues to decline further intervention of GJ so without dramatic improvement in her PO intake, she will not be able to sustain herself and will need further conversations regarding palliative/comfort care. -Increase PO intake--will let her eat whatever appeals to her -GI consulted, appreciate their recommendations -IV Protonix BID -Carafate at decreased dose for 4 additional days -Bentyl TID with meals -EGD biopsy results pending -Palliative care consulted, appreciate their assistance and recommendations -PT/OT -Continue Imodium -Crestor 10 mg daily -Reglan 10 mg TID with meals -Zyprexa 2.5 mg nightly -Peractin 2 mg 4 times daily until 11/08, then increase to 4 mg 4 times daily -Creon 24,000 units TID PO (cannot be administered via tube)   #AKI on CKD4 #Hyponatremia #Hyperkalemia Last assessment of renal function 11/02, at which time there was worsening of renal function compared to days prior. She has definite volume overload and torsemide 20 mg daily was initiated 11/03. She is not a candidate for CRRT or HD at this time. -Nephrology consulted, appreciate their recommendations  -Torsemide 20 mg daily -Avoid IV fluids. -Avoid nephrotoxic medications -Trend CMP   #HFpEF Currently net +14.7L. Torsemide initiated 11/03 for gentle diuresis, foley catheter in place.  -Torsemide 20 mg daily -Restrict IV fluids  Best Practice: Diet: Regular diet, tube feeds with free water flushes IVF: None VTE: enoxaparin (LOVENOX) injection 30 mg Start: 09/14/22 2045 Code: Full AB: None Therapy Recs: SNF DISPO: Anticipated discharge pending Inadequate Nutritional Intake  .  Signature: Farrel Gordon, D.O.  Internal Medicine Resident, PGY-2 [facility]  Pager:  [phone] 9:26 AM, 09/24/2022   Please contact the on call pager after 5 pm and on weekends at [phone].

## 2022-09-24 NOTE — Progress Notes (Signed)
Blood glucose 240, Dr. Eulas Post made aware and awaiting response  Anastasio Auerbach, RN

## 2022-09-24 NOTE — Plan of Care (Signed)
  Problem: Education: Goal: Ability to describe self-care measures that may prevent or decrease complications (Diabetes Survival Skills Education) will improve Outcome: Progressing Goal: Individualized Educational Video(s) Outcome: Progressing   Problem: Coping: Goal: Ability to adjust to condition or change in health will improve Outcome: Progressing   Problem: Fluid Volume: Goal: Ability to maintain a balanced intake and output will improve Outcome: Progressing   Problem: Health Behavior/Discharge Planning: Goal: Ability to identify and utilize available resources and services will improve Outcome: Progressing Goal: Ability to manage health-related needs will improve Outcome: Progressing   Problem: Metabolic: Goal: Ability to maintain appropriate glucose levels will improve Outcome: Progressing   Problem: Nutritional: Goal: Maintenance of adequate nutrition will improve Outcome: Progressing Goal: Progress toward achieving an optimal weight will improve Outcome: Progressing   Problem: Skin Integrity: Goal: Risk for impaired skin integrity will decrease Outcome: Progressing   Problem: Tissue Perfusion: Goal: Adequacy of tissue perfusion will improve Outcome: Progressing   Problem: Education: Goal: Knowledge of General Education information will improve Description: Including pain rating scale, medication(s)/side effects and non-pharmacologic comfort measures Outcome: Progressing   Problem: Health Behavior/Discharge Planning: Goal: Ability to manage health-related needs will improve Outcome: Progressing   Problem: Clinical Measurements: Goal: Ability to maintain clinical measurements within normal limits will improve Outcome: Progressing Goal: Will remain free from infection Outcome: Progressing Goal: Diagnostic test results will improve Outcome: Progressing Goal: Respiratory complications will improve Outcome: Progressing Goal: Cardiovascular complication will  be avoided Outcome: Progressing   Problem: Activity: Goal: Risk for activity intolerance will decrease Outcome: Progressing   Problem: Nutrition: Goal: Adequate nutrition will be maintained Outcome: Progressing   Problem: Coping: Goal: Level of anxiety will decrease Outcome: Progressing   Problem: Elimination: Goal: Will not experience complications related to bowel motility Outcome: Progressing Goal: Will not experience complications related to urinary retention Outcome: Progressing   Problem: Pain Managment: Goal: General experience of comfort will improve Outcome: Progressing   Problem: Safety: Goal: Ability to remain free from injury will improve Outcome: Progressing   Problem: Skin Integrity: Goal: Risk for impaired skin integrity will decrease Outcome: Progressing   Problem: Nutrition Goal: Patient maintains adequate hydration Outcome: Progressing Goal: Patient maintains weight Outcome: Progressing Goal: Patient/Family demonstrates understanding of diet Outcome: Progressing Goal: Patient/Family independently completes tube feeding Outcome: Progressing Goal: Patient will have no more than 5 lb weight change during LOS Outcome: Progressing Goal: Patient will utilize adaptive techniques to administer nutrition Outcome: Progressing Goal: Patient will verbalize dietary restrictions Outcome: Progressing

## 2022-09-24 NOTE — Progress Notes (Signed)
[facility] Progress Note   Assessment/ Plan:   AKI on CKD 4: BL possibly 2.5-3 but fluctuates. Previously was followed by Dr Joylene Grapes  - main issue looks like no Po intake--> now with urinary retention on top of everything  - stop IVFs with pleural effusions  - Foley in place now  - dont' expect this BUN to cause uremia- no indication for RRT  - dialysis would not be of utility in this setting- she appears profoundly depressed too but with some duodenal ulcers (which could be contributing to food aversion but not the whole picture) and if she's not eating dialysis essentially prolongs death by starvation.    - Zyprexa replaced remeron recently  - needs palliative care c/s, appreciate assistance  - only 30 mg protein on last UA, will send UP/C to be sure  - on torsemide 20 mg daily  - nothing further to add at this time- will sign off, call with questions  Depression:  - Zyprexa  - already tried remeron without much improvement  Abd pain: unknown etiology. NG tube in place. Extensive workup.  - endoscopy 11/1- duodenal ulcers, carafate reduced, on PPI   Elevated LFTs:  - per GI  - reorder TTE- EF 65-70% and G1DD    HTN: BP slightly high. Cont current meds  Subjective:    Seen in room.  Trying to get appetite improved.  Started torsemide yesterday.     Objective:   BP (!) 146/77 (BP Location: Left Arm)   Pulse 82   Temp 98.2 F (36.8 C) (Oral)   Resp 15   Ht '5\' 8"'$  (1.727 m)   Wt 116.8 kg   SpO2 98%   BMI 39.15 kg/m   Intake/Output Summary (Last 24 hours) at 09/24/2022 1033 Last data filed at 09/24/2022 0600 Gross per 24 hour  Intake 3473.59 ml  Output 470 ml  Net 3003.59 ml   Weight change:   Physical Exam: MOQ:HUTM affect, lygin in bed CVS:RRR Resp: clear LYY:TKPT Ext: 1+ LE edema  Imaging: DG Abd Portable 1V  Result Date: 09/23/2022 CLINICAL DATA:  Feeding tube placement EXAM: PORTABLE ABDOMEN - 1 VIEW COMPARISON:  09/23/2022 at 1256 hours  FINDINGS: Limited radiograph of the lower chest and upper abdomen was obtained for the purposes of enteric tube localization. Enteric tube is seen coursing below the diaphragm with distal tip coiled within the expected location of the distal stomach. IMPRESSION: Enteric tube tip coiled within the expected location of the distal stomach. Electronically Signed   By: Davina Poke D.O.   On: 09/23/2022 17:27   DG Abd Portable 1V  Result Date: 09/23/2022 CLINICAL DATA:  Feeding tube placement. EXAM: PORTABLE ABDOMEN - 1 VIEW COMPARISON:  CT 09/20/2022 FINDINGS: Tip of the weighted enteric tube is below the stomach, the tip is in the right upper quadrant. Feeding tube tip is in the region of the distal stomach. Few prominent loops of small bowel in the right abdomen are new from recent CT. There is contrast in the colon from prior CT. IMPRESSION: 1. Tip of the weighted enteric tube in the right upper quadrant, in the region of distal stomach. 2. Few prominent small bowel loops in the right abdomen, new from recent CT, likely represent ileus. Electronically Signed   By: Keith Rake M.D.   On: 09/23/2022 13:14   ECHOCARDIOGRAM COMPLETE  Result Date: 09/22/2022    ECHOCARDIOGRAM REPORT   Patient Name:   Alicia [name] Date of Exam: 09/22/2022 Medical  Rec #:  [id number]            Height:       68.0 in Accession #:    [id number]           Weight:       259.0 lb Date of Birth:  [birth year]            BSA:          2.281 m Patient Age:    71 years             BP:           135/93 mmHg Patient Gender: F                    HR:           80 bpm. Exam Location:  Inpatient Procedure: 2D Echo, Cardiac Doppler and Color Doppler Indications:    CHF  History:        Patient has prior history of Echocardiogram examinations, most                 recent 03/15/2022. Stroke; Risk Factors:Diabetes, Dyslipidemia                 and Hypertension.  Sonographer:    Memory Argue Referring Phys: [id number] Trae Bovenzi Shabbona  1. Left ventricular ejection fraction, by estimation, is 65 to 70%. The left ventricle has normal function. The left ventricle has no regional wall motion abnormalities. There is moderate concentric left ventricular hypertrophy. Left ventricular diastolic parameters are consistent with Grade I diastolic dysfunction (impaired relaxation).  2. Right ventricular systolic function is normal. The right ventricular size is normal. Tricuspid regurgitation signal is inadequate for assessing PA pressure.  3. The mitral valve is normal in structure. No evidence of mitral valve regurgitation. No evidence of mitral stenosis.  4. The aortic valve is tricuspid. There is moderate calcification of the aortic valve. Aortic valve regurgitation is trivial. Aortic valve sclerosis/calcification is present, without any evidence of aortic stenosis. Aortic regurgitation PHT measures 747  msec. Aortic valve area, by VTI measures 2.22 cm. Aortic valve mean gradient measures 5.0 mmHg. Aortic valve Vmax measures 1.57 m/s.  5. Aortic dilatation noted. There is mild dilatation of the ascending aorta, measuring 38 mm. FINDINGS  Left Ventricle: Left ventricular ejection fraction, by estimation, is 65 to 70%. The left ventricle has normal function. The left ventricle has no regional wall motion abnormalities. The left ventricular internal cavity size was normal in size. There is  moderate concentric left ventricular hypertrophy. Left ventricular diastolic parameters are consistent with Grade I diastolic dysfunction (impaired relaxation). Normal left ventricular filling pressure. Right Ventricle: The right ventricular size is normal. No increase in right ventricular wall thickness. Right ventricular systolic function is normal. Tricuspid regurgitation signal is inadequate for assessing PA pressure. Left Atrium: Left atrial size was normal in size. Right Atrium: Right atrial size was normal in size. Pericardium: Trivial pericardial  effusion is present. The pericardial effusion is posterior to the left ventricle. Mitral Valve: The mitral valve is normal in structure. No evidence of mitral valve regurgitation. No evidence of mitral valve stenosis. Tricuspid Valve: The tricuspid valve is normal in structure. Tricuspid valve regurgitation is trivial. No evidence of tricuspid stenosis. Aortic Valve: The aortic valve is tricuspid. There is moderate calcification of the aortic valve. Aortic valve regurgitation is trivial. Aortic regurgitation PHT measures 747 msec. Aortic valve sclerosis/calcification is present, without any evidence  of aortic stenosis. Aortic valve mean gradient measures 5.0 mmHg. Aortic valve peak gradient measures 9.9 mmHg. Aortic valve area, by VTI measures 2.22 cm. Pulmonic Valve: The pulmonic valve was normal in structure. Pulmonic valve regurgitation is not visualized. No evidence of pulmonic stenosis. Aorta: Aortic dilatation noted. There is mild dilatation of the ascending aorta, measuring 38 mm. Venous: The inferior vena cava was not well visualized. IAS/Shunts: No atrial level shunt detected by color flow Doppler.  LEFT VENTRICLE PLAX 2D LVIDd:         3.50 cm   Diastology LVIDs:         2.30 cm   LV e' medial:    5.13 cm/s LV PW:         1.40 cm   LV E/e' medial:  7.5 LV IVS:        1.40 cm   LV e' lateral:   4.51 cm/s LVOT diam:     2.00 cm   LV E/e' lateral: 8.6 LV SV:         53 LV SV Index:   23 LVOT Area:     3.14 cm  RIGHT VENTRICLE TAPSE (M-mode): 2.0 cm LEFT ATRIUM             Index        RIGHT ATRIUM           Index LA diam:        3.10 cm 1.36 cm/m   RA Area:     12.00 cm LA Vol (A2C):   48.8 ml 21.37 ml/m  RA Volume:   23.10 ml  10.13 ml/m LA Vol (A4C):   42.7 ml 18.72 ml/m LA Biplane Vol: 44.9 ml 19.68 ml/m  AORTIC VALVE AV Area (Vmax):    2.10 cm AV Area (Vmean):   1.97 cm AV Area (VTI):     2.22 cm AV Vmax:           157.00 cm/s AV Vmean:          103.000 cm/s AV VTI:            0.241 m AV Peak  Grad:      9.9 mmHg AV Mean Grad:      5.0 mmHg LVOT Vmax:         105.00 cm/s LVOT Vmean:        64.600 cm/s LVOT VTI:          0.170 m LVOT/AV VTI ratio: 0.71 AI PHT:            747 msec  AORTA Ao Root diam: 3.10 cm Ao Asc diam:  3.80 cm MITRAL VALVE MV Area (PHT): 3.12 cm    SHUNTS MV Decel Time: 243 msec    Systemic VTI:  0.17 m MV E velocity: 38.60 cm/s  Systemic Diam: 2.00 cm MV A velocity: 77.10 cm/s MV E/A ratio:  0.50 Fransico Him MD Electronically signed by Fransico Him MD Signature Date/Time: 09/22/2022/4:05:29 PM    Final     Labs: BMET Recent Labs  Lab 09/17/22 1057 09/18/22 0338 09/19/22 3532 09/20/22 0443 09/20/22 1012 09/21/22 1313 09/22/22 1026  NA 129* 132* 133* 130*  --  130* 134*  K 6.0* 4.5 4.4 5.5*  --  4.3 3.8  CL 99 103 99 98  --  95* 100  CO2 19* 18* 18* 17*  --   --  19*  GLUCOSE 137* 103* 215* 217*  --  68* 76  BUN 56*  57* 61* 65*  --  55* 66*  CREATININE 3.53* 3.42* 3.63* 3.82*  --  3.60* 3.73*  CALCIUM 7.6* 7.7* 8.1* 7.8*  --   --  8.0*  PHOS  --   --   --   --  3.6  --   --    CBC Recent Labs  Lab 09/17/22 1057 09/19/22 0738 09/21/22 1313  WBC 9.4 9.1  --   HGB 10.3* 11.4* 12.6  HCT 29.2* 34.1* 37.0  MCV 92.4 95.8  --   PLT 259 330  --     Medications:     acetaminophen  650 mg Rectal Q6H   Or   acetaminophen (TYLENOL) oral liquid 160 mg/5 mL  1,000 mg Oral Q6H   amLODipine  10 mg Oral Daily   cyproheptadine  2 mg Per Tube QID   dicyclomine  10 mg Oral TID AC & HS   DULoxetine  30 mg Oral BID   enoxaparin (LOVENOX) injection  30 mg Subcutaneous Q24H   feeding supplement  237 mL Oral TID BM   feeding supplement (PROSource TF20)  60 mL Per Tube Daily   folic acid  1 mg Oral Daily   free water  200 mL Per Tube Q4H   lipase/protease/amylase  24,000 Units Oral TID   metoCLOPramide  10 mg Oral TID AC   metoprolol tartrate  50 mg Oral BID   multivitamin  1 tablet Per Tube QHS   OLANZapine zydis  2.5 mg Oral QHS   mouth rinse  15 mL Mouth  Rinse 4 times per day   pantoprazole  40 mg Intravenous Q12H   rosuvastatin  10 mg Per Tube QPM   sodium bicarbonate  650 mg Oral BID   sucralfate  1 g Oral BID   thiamine  100 mg Per Tube Daily   torsemide  20 mg Oral Daily    Madelon Lips MD 09/24/2022, 10:33 AM

## 2022-09-25 ENCOUNTER — Encounter (HOSPITAL_COMMUNITY): Payer: Self-pay | Admitting: Gastroenterology

## 2022-09-25 DIAGNOSIS — R1084 Generalized abdominal pain: Secondary | ICD-10-CM | POA: Diagnosis not present

## 2022-09-25 DIAGNOSIS — B967 Clostridium perfringens [C. perfringens] as the cause of diseases classified elsewhere: Secondary | ICD-10-CM

## 2022-09-25 DIAGNOSIS — R638 Other symptoms and signs concerning food and fluid intake: Secondary | ICD-10-CM | POA: Diagnosis not present

## 2022-09-25 DIAGNOSIS — K529 Noninfective gastroenteritis and colitis, unspecified: Secondary | ICD-10-CM | POA: Diagnosis not present

## 2022-09-25 DIAGNOSIS — K8689 Other specified diseases of pancreas: Secondary | ICD-10-CM | POA: Diagnosis not present

## 2022-09-25 DIAGNOSIS — K269 Duodenal ulcer, unspecified as acute or chronic, without hemorrhage or perforation: Secondary | ICD-10-CM | POA: Diagnosis not present

## 2022-09-25 LAB — PROTEIN / CREATININE RATIO, URINE
Creatinine, Urine: 59 mg/dL
Protein Creatinine Ratio: 0.53 mg/mg{Cre} — ABNORMAL HIGH (ref 0.00–0.15)
Total Protein, Urine: 31 mg/dL

## 2022-09-25 LAB — GLUCOSE, CAPILLARY
Glucose-Capillary: 131 mg/dL — ABNORMAL HIGH (ref 70–99)
Glucose-Capillary: 132 mg/dL — ABNORMAL HIGH (ref 70–99)
Glucose-Capillary: 132 mg/dL — ABNORMAL HIGH (ref 70–99)
Glucose-Capillary: 136 mg/dL — ABNORMAL HIGH (ref 70–99)
Glucose-Capillary: 153 mg/dL — ABNORMAL HIGH (ref 70–99)
Glucose-Capillary: 159 mg/dL — ABNORMAL HIGH (ref 70–99)

## 2022-09-25 MED ORDER — CHLORHEXIDINE GLUCONATE CLOTH 2 % EX PADS
6.0000 | MEDICATED_PAD | Freq: Every day | CUTANEOUS | Status: DC
  Administered 2022-09-25 – 2022-10-01 (×7): 6 via TOPICAL

## 2022-09-25 MED ORDER — PANTOPRAZOLE SODIUM 40 MG PO TBEC
40.0000 mg | DELAYED_RELEASE_TABLET | Freq: Two times a day (BID) | ORAL | Status: DC
  Administered 2022-09-26 – 2022-09-27 (×4): 40 mg via ORAL
  Filled 2022-09-25 (×5): qty 1

## 2022-09-25 MED ORDER — METOCLOPRAMIDE HCL 5 MG/5ML PO SOLN
10.0000 mg | Freq: Once | ORAL | Status: AC
  Administered 2022-09-25: 10 mg via ORAL
  Filled 2022-09-25: qty 10

## 2022-09-25 MED ORDER — OSMOLITE 1.5 CAL PO LIQD
1000.0000 mL | ORAL | Status: DC
  Administered 2022-09-25 – 2022-09-27 (×2): 1000 mL
  Filled 2022-09-25 (×2): qty 1185

## 2022-09-25 MED ORDER — INSULIN ASPART 100 UNIT/ML IJ SOLN
0.0000 [IU] | INTRAMUSCULAR | Status: DC
  Administered 2022-09-26: 2 [IU] via SUBCUTANEOUS
  Administered 2022-09-26 (×2): 1 [IU] via SUBCUTANEOUS
  Administered 2022-09-26 (×2): 2 [IU] via SUBCUTANEOUS
  Administered 2022-09-27: 1 [IU] via SUBCUTANEOUS
  Administered 2022-09-27 (×2): 2 [IU] via SUBCUTANEOUS
  Administered 2022-09-28 (×3): 1 [IU] via SUBCUTANEOUS
  Administered 2022-09-28 – 2022-09-29 (×5): 2 [IU] via SUBCUTANEOUS
  Administered 2022-09-29: 1 [IU] via SUBCUTANEOUS
  Administered 2022-09-29 – 2022-09-30 (×3): 2 [IU] via SUBCUTANEOUS
  Administered 2022-09-30: 1 [IU] via SUBCUTANEOUS
  Administered 2022-09-30 – 2022-10-01 (×5): 2 [IU] via SUBCUTANEOUS
  Administered 2022-10-01 (×2): 1 [IU] via SUBCUTANEOUS
  Administered 2022-10-01: 2 [IU] via SUBCUTANEOUS

## 2022-09-25 MED ORDER — VANCOMYCIN 50 MG/ML ORAL SOLUTION
125.0000 mg | Freq: Four times a day (QID) | ORAL | Status: DC
  Administered 2022-09-25 – 2022-10-01 (×25): 125 mg
  Filled 2022-09-25 (×29): qty 2.5

## 2022-09-25 NOTE — Progress Notes (Signed)
Both arms are edematous, especially Rt. Arm with oozing. Lt. Arm has veins but non compressible or very small veins. Informed patient's RN regarding this matter. Patient is only getting IV Protonix, patient's RN will discuss  to MD that may switch IV to PO Protonix. HS [employer]

## 2022-09-25 NOTE — Progress Notes (Addendum)
HD#26 SUBJECTIVE:  Patient Summary: Alicia [name] is a 71 y.o. year old female currently admitted with severe food aversion secondary to multiple etiologies including nonhealing duodenal ulcers, depression, possible gastroparesis, and pancreatic insufficiency that has been refractory to maximal medical therapy.  Overnight Events: None  Interim History: [name] reports feeling overall well this morning. She expresses continued interest in PO intake specifically with coffee but does not want her breakfast tray at this time. She understands the importance of taking her medications each day. She understands that she must increase how much she eats and drink each day.  OBJECTIVE:  Vital Signs: Vitals:   09/24/22 2136 09/25/22 0525 09/25/22 0526 09/25/22 0950  BP: 125/61 (!) 142/68  (!) 142/57  Pulse: 80 64 74 63  Resp: 20 20    Temp: (!) 97.4 F (36.3 C) (!) 97.4 F (36.3 C)  (!) 97.5 F (36.4 C)  TempSrc: Oral Oral  Oral  SpO2: (!) 71% (!) 79% 99% 100%  Weight:      Height:       Supplemental O2: Room Air SpO2: 100 % O2 Flow Rate (L/min): 2 L/min  Filed Weights   09/20/22 0441 09/21/22 0300 09/23/22 0500  Weight: 117.5 kg 117.5 kg 116.8 kg     Intake/Output Summary (Last 24 hours) at 09/25/2022 1319 Last data filed at 09/25/2022 0553 Gross per 24 hour  Intake 0 ml  Output 575 ml  Net -575 ml    Net IO Since Admission: 28,263.83 mL [09/25/22 1319]  Physical Exam: Constitutional:Comfortably resting in bed. In no acute distress. Cardiac: Regular rate and rhythm. No murmurs, rubs, or gallops. Peripheral edema to all extremities 2+. Pulm:Normal work of breathing on room air. Abdomen:Soft, nontender. Periumbilical hernia unchanged Psych:Awake, appropriately engaged in conversation. Flat affect.   Patient Lines/Drains/Airways Status     Active Line/Drains/Airways     Name Placement date Placement time Site Days   Peripheral IV 09/03/22 20 G 1.88"  Anterior;Left;Upper Arm 09/03/22  1639  Arm  21   Urethral Catheter CAndice A, RN Latex 14 Fr. 09/21/22  1150  Latex  3   Small Bore Feeding Tube 10 Fr. Left nare Marking at nare/corner of mouth 68 cm 09/23/22  1147  Left nare  1   Pressure Injury 08/25/22 Sacrum Medial Stage 1 -  Intact skin with non-blanchable redness of a localized area usually over a bony prominence. 08/25/22  2000  -- 30   Wound / Incision (Open or Dehisced) 09/08/22 Non-pressure wound Thigh Left;Posterior;Proximal 1cm by 1cm, non pressure wound 09/08/22  1200  Thigh  16   Wound / Incision (Open or Dehisced) 09/08/22 Non-pressure wound Thigh Posterior;Proximal;Right 1cm by 1cm non pressure wound 09/08/22  1200  Thigh  16             ASSESSMENT/PLAN:  Assessment: Principal Problem:   Chronic generalized abdominal pain Active Problems:   Acute worsening of stage 4 chronic kidney disease (HCC)   Generalized weakness   Pressure injury of skin   Nausea and vomiting   Diarrhea   Duodenal ulcer   Mucosal abnormality of stomach   Mucosal abnormality of esophagus   Food aversion   Hypoalbuminemia due to protein-calorie malnutrition (HCC)   Plan: #Severe food aversion resulting in minimal PO intake #Abdominal Pain of Unkown Etiology #Chronic diarrhea #IMA occlusion #Duodenal ulcers #Pancreatic insufficiency #Clostridium difficile positive Continuing medical management, symptoms are well controlled, appreciate consultant recommendations.  Cortrak replaced 11/03. CBG have been appropriate.  PO intake is still minimal at best. She is PCR-positive for c. Diff. EGD biopsy results show mild chronic gastritis, negative for H. Pylori. -Increase PO intake--will let her eat whatever appeals to her -CoreTrak with feeds, will increase rate to 40/hr today -GI consulted, appreciate their recommendations -IV Protonix BID -Carafate at decreased dose for 3 additional days -Bentyl TID with meals -Vancomycin PO for c.  diff -Palliative care consulted, appreciate their assistance and recommendations -PT/OT -Continue Imodium -Crestor 10 mg daily -Reglan 10 mg TID with meals -Zyprexa 2.5 mg nightly -Peractin 2 mg 4 times daily until 11/08, then increase to 4 mg 4 times daily -Creon 24,000 units TID PO (cannot be administered via tube)   #AKI on CKD4 #Hyponatremia #Hyperkalemia, resolved Renal function showed slight worsening of serum creatinine 3.85>3.99, though overall stable. She continues to have definite volume overload and torsemide 20 mg daily was initiated 11/03. She is not a candidate for CRRT or HD at this time. Urine protein/creatinine ratio elevated to 0.53. -Nephrology consulted, appreciate their recommendations  -Torsemide 20 mg daily -Avoid IV fluids. -Avoid nephrotoxic medications -Trend CMP   #HFpEF Currently net +11.1L which is much improved from previous days. Torsemide initiated 11/03 for gentle diuresis, foley catheter in place.  -Torsemide 20 mg daily -Restrict IV fluids -Strict I's/O's  #Normocytic anemia Hgb noted to be significantly decreased at 8.1 11/04, previously 12.6, and Hct 24.0 from 37.0. We are diuresing her but remaining cell lines are stable. No obvious signs of bleeding.  -Trend CBC  Best Practice: Diet: Regular diet, tube feeds with free water flushes IVF: None VTE: enoxaparin (LOVENOX) injection 30 mg Start: 09/14/22 2045 Code: Full AB: Vancomycin Therapy Recs: SNF DISPO: Anticipated discharge pending Inadequate Nutritional Intake  .  Signature: Farrel Gordon, D.O.  Internal Medicine Resident, PGY-2 [facility]  Pager: [phone] 1:19 PM, 09/25/2022   Please contact the on call pager after 5 pm and on weekends at [phone].

## 2022-09-25 NOTE — Progress Notes (Signed)
Patient ID: [name], female   DOB: 10-29-1951, 71 y.o.   MRN: [id number]      Progress Note from the Palliative Medicine Team at [facility]   Patient Name: Alicia [name]        Date: 09/25/2022 DOB: 11/25/1950  Age: 71 y.o. MRN#: [id number] Attending Physician: Aldine Contes, MD Primary Care Physician: Rick Duff, MD Admit Date: 08/24/2022   Medical records reviewed   71 y.o. female  admitted on 08/24/2022 with  complex past medical history;  H.pylori gastritis (treated in 2020, follow up eradication testing negative), chronic abdominal pain,  chronic nausea,  colon polyps, pancreatic insufficiency, chronic IMA occlusion, duodenal ulcers, obesity    Admitted with weakness, post-prandial abdominal pain, N/V, loose stools.  AKI on CKD 4, refusing to eat/psych consulted, depression.  Patient is failing to thrive.  Patient/family  faces treatment option decisions, advanced directive decisions and anticipatory care needs.    Core track.placed today.  Trickle tube feeds to start as a trial.   Education offered on her multiple co-morbidities and the seriousness of her current medical situation.  Patient is lethargic with very little input into the conversation.  I worry that today patient is too lethargic and does not have  capacity to make medical decisions at this time.   Education offered today regarding  the importance of continued conversation with her family and their  medical providers regarding overall plan of care and treatment options,  ensuring decisions are within the context of the patients values and GOCs.  Questions and concerns addressed   Discussed with Dr   Jodell Cipro, discussed underlying depression and need to reconsult psychiatry. Cymbalta is not at therapeutic dose for depression  Total time spent on the unit was 35 minutes  PMT will continue to support holistically  Wadie Lessen NP  Palliative Medicine Team Team Phone # [phone] Pager [phone]

## 2022-09-25 NOTE — Progress Notes (Signed)
MD notified of IV team's inability to gain IV access (see IV team note). MD states okay for no IV access. States they will switch protonix to PO from IV.

## 2022-09-26 ENCOUNTER — Inpatient Hospital Stay: Payer: Self-pay

## 2022-09-26 DIAGNOSIS — B967 Clostridium perfringens [C. perfringens] as the cause of diseases classified elsewhere: Secondary | ICD-10-CM | POA: Diagnosis not present

## 2022-09-26 DIAGNOSIS — G8929 Other chronic pain: Secondary | ICD-10-CM | POA: Diagnosis not present

## 2022-09-26 DIAGNOSIS — F32A Depression, unspecified: Secondary | ICD-10-CM | POA: Diagnosis not present

## 2022-09-26 DIAGNOSIS — R531 Weakness: Secondary | ICD-10-CM | POA: Diagnosis not present

## 2022-09-26 DIAGNOSIS — K529 Noninfective gastroenteritis and colitis, unspecified: Secondary | ICD-10-CM | POA: Diagnosis not present

## 2022-09-26 DIAGNOSIS — K269 Duodenal ulcer, unspecified as acute or chronic, without hemorrhage or perforation: Secondary | ICD-10-CM | POA: Diagnosis not present

## 2022-09-26 DIAGNOSIS — R638 Other symptoms and signs concerning food and fluid intake: Secondary | ICD-10-CM | POA: Diagnosis not present

## 2022-09-26 DIAGNOSIS — K8689 Other specified diseases of pancreas: Secondary | ICD-10-CM | POA: Diagnosis not present

## 2022-09-26 DIAGNOSIS — R1084 Generalized abdominal pain: Secondary | ICD-10-CM | POA: Diagnosis not present

## 2022-09-26 LAB — CBC
HCT: 24.6 % — ABNORMAL LOW (ref 36.0–46.0)
Hemoglobin: 8.3 g/dL — ABNORMAL LOW (ref 12.0–15.0)
MCH: 33.2 pg (ref 26.0–34.0)
MCHC: 33.7 g/dL (ref 30.0–36.0)
MCV: 98.4 fL (ref 80.0–100.0)
Platelets: 333 10*3/uL (ref 150–400)
RBC: 2.5 MIL/uL — ABNORMAL LOW (ref 3.87–5.11)
RDW: 20.7 % — ABNORMAL HIGH (ref 11.5–15.5)
WBC: 9 10*3/uL (ref 4.0–10.5)
nRBC: 0 % (ref 0.0–0.2)

## 2022-09-26 LAB — GLUCOSE, CAPILLARY
Glucose-Capillary: 139 mg/dL — ABNORMAL HIGH (ref 70–99)
Glucose-Capillary: 140 mg/dL — ABNORMAL HIGH (ref 70–99)
Glucose-Capillary: 153 mg/dL — ABNORMAL HIGH (ref 70–99)
Glucose-Capillary: 158 mg/dL — ABNORMAL HIGH (ref 70–99)
Glucose-Capillary: 163 mg/dL — ABNORMAL HIGH (ref 70–99)
Glucose-Capillary: 173 mg/dL — ABNORMAL HIGH (ref 70–99)

## 2022-09-26 LAB — IRON AND TIBC: Iron: 79 ug/dL (ref 28–170)

## 2022-09-26 LAB — COMPREHENSIVE METABOLIC PANEL
ALT: 53 U/L — ABNORMAL HIGH (ref 0–44)
AST: 48 U/L — ABNORMAL HIGH (ref 15–41)
Albumin: 1.6 g/dL — ABNORMAL LOW (ref 3.5–5.0)
Alkaline Phosphatase: 395 U/L — ABNORMAL HIGH (ref 38–126)
Anion gap: 15 (ref 5–15)
BUN: 79 mg/dL — ABNORMAL HIGH (ref 8–23)
CO2: 16 mmol/L — ABNORMAL LOW (ref 22–32)
Calcium: 7.8 mg/dL — ABNORMAL LOW (ref 8.9–10.3)
Chloride: 100 mmol/L (ref 98–111)
Creatinine, Ser: 4.08 mg/dL — ABNORMAL HIGH (ref 0.44–1.00)
GFR, Estimated: 11 mL/min — ABNORMAL LOW (ref >60)
Glucose, Bld: 174 mg/dL — ABNORMAL HIGH (ref 70–99)
Potassium: 4.4 mmol/L (ref 3.5–5.1)
Sodium: 131 mmol/L — ABNORMAL LOW (ref 135–145)
Total Bilirubin: 0.5 mg/dL (ref 0.3–1.2)
Total Protein: 4.3 g/dL — ABNORMAL LOW (ref 6.5–8.1)

## 2022-09-26 LAB — FERRITIN: Ferritin: 339 ng/mL — ABNORMAL HIGH (ref 11–307)

## 2022-09-26 LAB — RETICULOCYTES
Immature Retic Fract: 19.4 % — ABNORMAL HIGH (ref 2.3–15.9)
RBC.: 2.52 MIL/uL — ABNORMAL LOW (ref 3.87–5.11)
Retic Count, Absolute: 117.2 10*3/uL (ref 19.0–186.0)
Retic Ct Pct: 4.7 % — ABNORMAL HIGH (ref 0.4–3.1)

## 2022-09-26 NOTE — Progress Notes (Signed)
Patient ID: SHIRLA HODGKISS, female   DOB: 06/12/1951, 71 y.o.   MRN: [id number]      Progress Note from the Palliative Medicine Team at [facility]   Patient Name: MALGORZATA ALBERT        Date: 09/26/2022 DOB: 1951/02/18  Age: 71 y.o. MRN#: [id number] Attending Physician: Charise Killian, MD Primary Care Physician: Rick Duff, MD Admit Date: 08/24/2022   Medical records reviewed   71 y.o. female  admitted on 08/24/2022 with  complex past medical history;  H.pylori gastritis (treated in 2020, follow up eradication testing negative), chronic abdominal pain,  chronic nausea,  colon polyps, pancreatic insufficiency, chronic IMA occlusion, duodenal ulcers, obesity    Admitted with weakness, post-prandial abdominal pain, N/V, loose stools.  AKI on CKD 4, refusing to eat/psych consulted, depression.  Patient is failing to thrive.  Patient/family  faces treatment option decisions, advanced directive decisions and anticipatory care needs.    Core track in place for nutritional support and tolerated at this time.    Created space and opportunity for patient to explore her thoughts and feelings regarding current medical situation.     Patient is lethargic with very little input into the conversation.  Education offered today regarding  the importance of continued conversation with her family and the  medical providers regarding overall plan of care and treatment options,  ensuring decisions are within the context of the patients values and GOCs.  Questions and concerns addressed     Discussed with attending team and again  discussed underlying depression and need to reconsult psychiatry, for thorough assessment of depressed mood. Recommendation for head CT  Total time spent on the unit was 50  minutes  PMT will continue to support holistically  Wadie Lessen NP  Palliative Medicine Team Team Phone # [phone] Pager [phone]

## 2022-09-26 NOTE — Progress Notes (Signed)
Subjective:   Summary: Alicia [name] is a 71 y.o. year old female currently admitted on the I[facility] HD#33 for diffuse abdominal pain as well as severely decreased PO intake likely multifactorial in etiology due to pancreatic insufficiency, chronic IMA occlusion, duodenal ulcers.  Overnight Events:  -lost IV access yesterday - Symptoms continue to be well managed.  Her abdominal pain is well treated, though she does have some mild persistent crampy postprandial abdominal pain.  She does continue to have diarrhea, though she denies any dark stools and she mentions that her diarrhea is better compared to earlier in the admission.  She had 1 episode of nonbloody nonbilious low-volume emesis over the weekend, but none since then she says she does not have any nausea currently.  She states that the main reason she is not taking more p.o. intake is because of her lack of appetite. - We had an extensive conversation regarding plans and goals of care.  She understands that given her persistently poor p.o. intake, the main options we are considering now are GJ placement and outpatient management versus hospice/palliative care.  She understands that if her p.o. intake does not improve and if she did not have the [place] tube placed that she would likely pass away within days to weeks.  She understands the [place] tube would only be a temporary measure and that it would likely not improve quality of life for life span.  She is okay with the plan to place GJ tube temporarily and try to improve p.o. intake in the outpatient setting with physical rehabilitation and also CBT in addition to her current medical management.  We encouraged her to continue thinking about this decision and discuss it with her family and the palliative care team.   Objective:  Vital signs in last 24 hours: Vitals:   09/25/22 1822 09/25/22 2033 09/26/22 0100 09/26/22 0448  BP: (!) 127/54 128/64  121/63  Pulse: 71 73  71   Resp:  16  17  Temp:  97.8 F (36.6 C)  98.5 F (36.9 C)  TempSrc:  Oral    SpO2: 100% 100% 100% 99%  Weight:      Height:       Supplemental O2: Room Air    Physical Exam:  Constitutional: NAD HEENT: Coretrack in place Cardiovascular: RRR, no murmurs rubs or gallops Pulmonary: Normal respiratory effort on room air Abdominal: minimal diffuse TTP, no guarding or rebound.  Distended but soft.  Periumbilical hernia unchanged.  Extremities: 2+ pitting edema of lower extremities bilaterally Neuro/Psych: Affect very flat today.   Intake/Output Summary (Last 24 hours) at 09/26/2022 0635 Last data filed at 09/26/2022 0600 Gross per 24 hour  Intake 1384 ml  Output 1200 ml  Net 184 ml    Net IO Since Admission: 28,447.83 mL [09/26/22 0635]  Pertinent Labs:    Latest Ref Rng & Units 09/26/2022    4:03 AM 09/24/2022    2:33 PM 09/21/2022    1:13 PM  CBC  WBC 4.0 - 10.5 K/uL 9.0  8.2    Hemoglobin 12.0 - 15.0 g/dL 8.3  8.1  12.6   Hematocrit 36.0 - 46.0 % 24.6  24.0  37.0   Platelets 150 - 400 K/uL 333  345         Latest Ref Rng & Units 09/26/2022    4:03 AM 09/24/2022    2:33 PM 09/24/2022  2:00 PM  CMP  Glucose 70 - 99 mg/dL 174  187  185   BUN 8 - 23 mg/dL 79  69  70   Creatinine 0.44 - 1.00 mg/dL 4.08  3.99  3.85   Sodium 135 - 145 mmol/L 131  132  131   Potassium 3.5 - 5.1 mmol/L 4.4  4.0  4.0   Chloride 98 - 111 mmol/L 100  100  99   CO2 22 - 32 mmol/L '16  18  17   '$ Calcium 8.9 - 10.3 mg/dL 7.8  7.6  7.6   Total Protein 6.5 - 8.1 g/dL 4.3  4.5    Total Bilirubin 0.3 - 1.2 mg/dL 0.5  0.8    Alkaline Phos 38 - 126 U/L 395  382    AST 15 - 41 U/L 48  53    ALT 0 - 44 U/L 53  50    Last 3 CBGs 173, 158, 136 C dif PCR positive Surgical pathology from EGD negative for H Pylori Urine protein to creatinine ratio 0.53, total urine protein 31  Imaging:    Assessment/Plan:   Patient Summary: Alicia [name] is a 71 y.o. with a pertinent PMH of HFpEF, HTN,  DMII, CKD4, PAD, prior CVA, who presented with progressive weakness and post-prandial abdominal pain and admitted for decreased functional ability, diffuse abdominal pain, and chronic diarrhea likely multifactorial in etiology due to pancreatic insufficiency, duodenal ulcers.  Still taking in very little p.o. intake and unable to sustain herself with this.  #Abdominal Pain of Unkown Etiology #Chronic diarrhea #IMA occlusion #Duodenal ulcers #Pancreatic insufficiency #Lack of PO Intake  #Severe food aversion #C. Diff infection Continuing medical management, symptoms are well controlled, appreciate consultant recommendations.  PO vanc started for C Diff infection over the weekend. CBGs adequate. Cortrak in with feeds at 40 ml/hr. PO intake still poor and food aversion still seems to be the primary driver.  Had extensive discussion this morning about goals of care.  It seems like Alicia [name] (and her family from previous discussions) would be amenable to temporary GJ placement as opposed to transitioning to hospice/palliative care.  We will continue to explore this and involve family in discussions, appreciate palliative care assistance. Plan:  -Palliative care consulted, appreciate their assistance and recommendations.  Need to solidify decision making on GJ tube and involved family. -Increase PO intake--will let her eat whatever appeals to her -CoreTrak with feeds at 40/hr today -GI consulted, appreciate their recommendations -PO Protonix BID -Carafate at decreased dose for 2 additional days -Bentyl TID with meals -Vancomycin PO for c. diff -PT/OT -Continue Imodium -Crestor 10 mg daily -Reglan 10 mg TID with meals -Zyprexa 2.5 mg nightly -Peractin 2 mg 4 times daily until 11/08, then increase to 4 mg 4 times daily -Creon 24,000 units TID PO (cannot be administered via tube)  #AKI on CKD4 #Hyponatremia #Hyperkalemia BUN 79, Cr 4.08, UPC ratio elevated. Started on torsemide '20mg'$  over  weekend with significant diuretic response. UOP 1.2L ON. Need to consider repeat nephrology consult d/t worsening kidney function on torsemide. - Appreciate nephrology for assistance. - Avoid IV fluids. Need to replace IV access - Avoid nephrotoxic medications - Trend CMP  #HFpEF Repeat echo shows EF 65 to 70% and grade 1 diastolic dysfunction as well as aortic sclerosis with mild regurgitation.  She is significantly volume overloaded on exam and is up to 25 L on fluids during the course of his admission, mostly because she had been requiring  frequent supplementation with IV fluids for protection of kidney function.  On torsemide '20mg'$ , will reassess with nephrology as above. - Restrict IV fluids - Reassess torsemide 20 mg daily  #Normocytic anemia Hgb noted to be significantly decreased at 8.3. Etiology likely multifactorial including CKD, inflammation, iron deficiency, malnutrition. Will obtain further studies -Trend CBC -f\u ferritin, iron, TIBC, reticulocytes  Code: Full Dispo: Pending symptomatic improvement and increased PO intake vs. PEG placement.  Linus Galas MD PGY-1 Internal Medicine Resident Pager: [phone] Please contact the on call pager after 5 pm and on weekends at [phone].

## 2022-09-26 NOTE — Progress Notes (Signed)
Patient assessed for PIV using ultrasound.  Unable to locate appropriate vessels to attempt.  BUE are swollen.  Right arm veins are all too deep and small (>100% occupancy of the catheter).  Left arm veins are non compressible.  Other left arm veins are also too deep or too small.  Message sent to Dr. Saverio Danker and verbal update given to Chester Holstein, RN.  Suggest central line if IV access is required.

## 2022-09-26 NOTE — Progress Notes (Signed)
PT Cancellation Note  Patient Details Name: ARLENIS BLAYDES MRN: [id number] DOB: 10-Oct-1951   Cancelled Treatment:    Reason Eval/Treat Not Completed: Other (comment)  2 attempts to work with Ms. Rosana Berger for PT;  Needed hygiene/cleaning up at first attempt;  IV Team in at second attempt;   Will follow up later today as time allows;  Otherwise, will follow up for PT tomorrow;   Thank you,  Roney Marion, [place] [facility] [place] 09/26/2022, 3:04 PM

## 2022-09-26 NOTE — Progress Notes (Signed)
PT Cancellation Note  Patient Details Name: Alicia [name] MRN: [id number] DOB: 05/24/1951   Cancelled Treatment:    Reason Eval/Treat Not Completed: Patient declined, no reason specified Pt refused to get out of bed, despite max encouragement, stating she had "waited too long,"  Wyona Almas, PT, [place] [facility] [place] 09/26/2022, 4:07 PM

## 2022-09-27 ENCOUNTER — Inpatient Hospital Stay (HOSPITAL_COMMUNITY): Payer: Medicare HMO

## 2022-09-27 DIAGNOSIS — R1084 Generalized abdominal pain: Secondary | ICD-10-CM | POA: Diagnosis not present

## 2022-09-27 DIAGNOSIS — N184 Chronic kidney disease, stage 4 (severe): Secondary | ICD-10-CM | POA: Diagnosis not present

## 2022-09-27 DIAGNOSIS — I130 Hypertensive heart and chronic kidney disease with heart failure and stage 1 through stage 4 chronic kidney disease, or unspecified chronic kidney disease: Secondary | ICD-10-CM | POA: Diagnosis not present

## 2022-09-27 DIAGNOSIS — D649 Anemia, unspecified: Secondary | ICD-10-CM | POA: Diagnosis not present

## 2022-09-27 DIAGNOSIS — I509 Heart failure, unspecified: Secondary | ICD-10-CM | POA: Diagnosis not present

## 2022-09-27 DIAGNOSIS — N179 Acute kidney failure, unspecified: Secondary | ICD-10-CM | POA: Diagnosis not present

## 2022-09-27 DIAGNOSIS — R531 Weakness: Secondary | ICD-10-CM | POA: Diagnosis not present

## 2022-09-27 DIAGNOSIS — E1110 Type 2 diabetes mellitus with ketoacidosis without coma: Secondary | ICD-10-CM | POA: Diagnosis not present

## 2022-09-27 DIAGNOSIS — R52 Pain, unspecified: Secondary | ICD-10-CM | POA: Diagnosis not present

## 2022-09-27 DIAGNOSIS — E876 Hypokalemia: Secondary | ICD-10-CM | POA: Diagnosis not present

## 2022-09-27 DIAGNOSIS — N390 Urinary tract infection, site not specified: Secondary | ICD-10-CM | POA: Diagnosis not present

## 2022-09-27 DIAGNOSIS — M7989 Other specified soft tissue disorders: Secondary | ICD-10-CM

## 2022-09-27 DIAGNOSIS — G8929 Other chronic pain: Secondary | ICD-10-CM | POA: Diagnosis not present

## 2022-09-27 LAB — GLUCOSE, CAPILLARY
Glucose-Capillary: 101 mg/dL — ABNORMAL HIGH (ref 70–99)
Glucose-Capillary: 116 mg/dL — ABNORMAL HIGH (ref 70–99)
Glucose-Capillary: 118 mg/dL — ABNORMAL HIGH (ref 70–99)
Glucose-Capillary: 158 mg/dL — ABNORMAL HIGH (ref 70–99)
Glucose-Capillary: 163 mg/dL — ABNORMAL HIGH (ref 70–99)
Glucose-Capillary: 148 mg/dL — ABNORMAL HIGH (ref 70–99)

## 2022-09-27 LAB — COMPREHENSIVE METABOLIC PANEL
ALT: 50 U/L — ABNORMAL HIGH (ref 0–44)
AST: 41 U/L (ref 15–41)
Albumin: 1.6 g/dL — ABNORMAL LOW (ref 3.5–5.0)
Alkaline Phosphatase: 404 U/L — ABNORMAL HIGH (ref 38–126)
Anion gap: 15 (ref 5–15)
BUN: 88 mg/dL — ABNORMAL HIGH (ref 8–23)
CO2: 16 mmol/L — ABNORMAL LOW (ref 22–32)
Calcium: 7.7 mg/dL — ABNORMAL LOW (ref 8.9–10.3)
Chloride: 97 mmol/L — ABNORMAL LOW (ref 98–111)
Creatinine, Ser: 4.24 mg/dL — ABNORMAL HIGH (ref 0.44–1.00)
GFR, Estimated: 11 mL/min — ABNORMAL LOW (ref >60)
Glucose, Bld: 121 mg/dL — ABNORMAL HIGH (ref 70–99)
Potassium: 4.6 mmol/L (ref 3.5–5.1)
Sodium: 128 mmol/L — ABNORMAL LOW (ref 135–145)
Total Bilirubin: 0.5 mg/dL (ref 0.3–1.2)
Total Protein: 4.4 g/dL — ABNORMAL LOW (ref 6.5–8.1)

## 2022-09-27 LAB — CBC
HCT: 25.6 % — ABNORMAL LOW (ref 36.0–46.0)
Hemoglobin: 8.8 g/dL — ABNORMAL LOW (ref 12.0–15.0)
MCH: 33.3 pg (ref 26.0–34.0)
MCHC: 34.4 g/dL (ref 30.0–36.0)
MCV: 97 fL (ref 80.0–100.0)
Platelets: 288 10*3/uL (ref 150–400)
RBC: 2.64 MIL/uL — ABNORMAL LOW (ref 3.87–5.11)
RDW: 20.9 % — ABNORMAL HIGH (ref 11.5–15.5)
WBC: 12.8 10*3/uL — ABNORMAL HIGH (ref 4.0–10.5)
nRBC: 0 % (ref 0.0–0.2)

## 2022-09-27 MED ORDER — FUROSEMIDE 10 MG/ML IJ SOLN: 80.0000 mg | Freq: Three times a day (TID) | INTRAMUSCULAR | Status: DC

## 2022-09-27 MED ORDER — MIRTAZAPINE 15 MG PO TBDP
7.5000 mg | ORAL_TABLET | Freq: Every day | ORAL | Status: DC
  Administered 2022-09-27: 7.5 mg via ORAL
  Filled 2022-09-27 (×3): qty 0.5

## 2022-09-27 MED ORDER — DULOXETINE HCL 30 MG PO CPEP
30.0000 mg | ORAL_CAPSULE | Freq: Every day | ORAL | Status: DC
  Administered 2022-09-28: 30 mg via ORAL
  Filled 2022-09-27: qty 1

## 2022-09-27 MED ORDER — FUROSEMIDE 40 MG PO TABS
80.0000 mg | ORAL_TABLET | Freq: Three times a day (TID) | ORAL | Status: DC
  Administered 2022-09-27 – 2022-10-01 (×12): 80 mg
  Filled 2022-09-27 (×12): qty 2

## 2022-09-27 NOTE — Progress Notes (Signed)
Pt. Has order for IV Lasix, pt. Has no IV and is a difficult IV start, Dr. Marlou Sa aware, instructions obtained to re-consult IV team to attempt another IV start for IV Lasix  Anastasio Auerbach

## 2022-09-27 NOTE — Progress Notes (Signed)
This patient was assessed today and the last 2 days by the [facility]. This patient has no appropriate vasculature for PIV placement at this time. Please review previous notes/documentation. Fran Lowes, RN [facility]

## 2022-09-27 NOTE — Progress Notes (Addendum)
Subjective:   Summary: Alicia [name] is a 71 y.o. year old female currently admitted on the IMTS HD#34 for diffuse abdominal pain as well as severely decreased PO intake likely multifactorial in etiology due to pancreatic insufficiency, chronic IMA occlusion, duodenal ulcers.  Overnight Events:  -Still no IV access - spoke to family yesterday regarding the plan and they agree with temporary GJ placement - Seems significantly more somnolent and confused this morning.  Not able to recount the details of the plan we discussed yesterday involving placing GJ tube.  She had spoken with her daughter yesterday regarding G-tube placement, but when asked about this she seems upset and states she does not want to talk about it. - Abdominal pain, nausea, diarrhea still adequately controlled - Endorses pain in right upper extremity on palpation   Objective:  Vital signs in last 24 hours: Vitals:   09/26/22 0900 09/26/22 1837 09/26/22 2245 09/27/22 0503  BP: 122/63 (!) 176/155 (!) 116/103 112/78  Pulse: 72 80 86 89  Resp: 17   18  Temp: 98.4 F (36.9 C) (!) 97.5 F (36.4 C) 97.7 F (36.5 C) (!) 97.4 F (36.3 C)  TempSrc:  Oral Oral   SpO2: 98% 100%  100%  Weight:      Height:       Supplemental O2: Room Air    Physical Exam:  Constitutional: NAD HEENT: Coretrack in place Cardiovascular: RRR, no murmurs rubs or gallops Pulmonary: Normal respiratory effort on room air Abdominal: minimal diffuse TTP, no guarding or rebound.  Distended but soft.  Periumbilical hernia unchanged.  Extremities: 2+ pitting edema of lower extremities bilaterally.  Upper extremities grossly edematous.  right upper extremity tender on palpation. Neuro/Psych: Affect very flat today, appears somnolent, disoriented.  Moves all extremities spontaneously and to instruction.   Intake/Output Summary (Last 24 hours) at 09/27/2022 0718 Last data filed at 09/26/2022 0913 Gross per 24 hour   Intake 0 ml  Output --  Net 0 ml    Net IO Since Admission: 28,447.83 mL [09/27/22 0718]  Pertinent Labs:    Latest Ref Rng & Units 09/27/2022    5:44 AM 09/26/2022    4:03 AM 09/24/2022    2:33 PM  CBC  WBC 4.0 - 10.5 K/uL 12.8  9.0  8.2   Hemoglobin 12.0 - 15.0 g/dL 8.8  8.3  8.1   Hematocrit 36.0 - 46.0 % 25.6  24.6  24.0   Platelets 150 - 400 K/uL 288  333  345        Latest Ref Rng & Units 09/27/2022    5:44 AM 09/26/2022    4:03 AM 09/24/2022    2:33 PM  CMP  Glucose 70 - 99 mg/dL 121  174  187   BUN 8 - 23 mg/dL 88  79  69   Creatinine 0.44 - 1.00 mg/dL 4.24  4.08  3.99   Sodium 135 - 145 mmol/L 128  131  132   Potassium 3.5 - 5.1 mmol/L 4.6  4.4  4.0   Chloride 98 - 111 mmol/L 97  100  100   CO2 22 - 32 mmol/L '16  16  18   '$ Calcium 8.9 - 10.3 mg/dL 7.7  7.8  7.6   Total Protein 6.5 - 8.1 g/dL 4.4  4.3  4.5   Total Bilirubin 0.3 - 1.2 mg/dL 0.5  0.5  0.8  Alkaline Phos 38 - 126 U/L 404  395  382   AST 15 - 41 U/L 41  48  53   ALT 0 - 44 U/L 50  53  50   Last 3 CBGs 116, 101, 140 Urine protein to creatinine ratio 0.53, total urine protein 31 Ferritin 339, reticulocyte count percent 1.7, absolute 117 Iron/TIBC hemolyzed, repeated   Imaging:    Assessment/Plan:   Patient Summary: Alicia [name] is a 71 y.o. with a pertinent PMH of HFpEF, HTN, DMII, CKD4, PAD, prior CVA, who presented with progressive weakness and post-prandial abdominal pain and admitted for decreased functional ability, diffuse abdominal pain, and chronic diarrhea likely multifactorial in etiology due to pancreatic insufficiency, duodenal ulcers.    #Abdominal Pain of Unkown Etiology #Chronic diarrhea #IMA occlusion #Duodenal ulcers #Pancreatic insufficiency #Lack of PO Intake  #Severe food aversion #C. Diff infection Continuing medical management, abdominal pain, nausea, diarrhea are well controlled, appreciate consultant recommendations.  Electing to not go forward with GJ  placement today given worsening encephalopathy and work-up with nephrology regarding uremia.  We will get in touch with psych to reevaluate for depression and consider medical management/the patient meets criteria for ECT/ketamine when she is medically stable.  Once Alicia [name] is more stable, we will readdress GJ placement. Plan:  -Palliative care consulted, appreciate their assistance and recommendations.  Need to solidify decision making on GJ tube and involved family. - Psych reconsulted, appreciate recs -Increase PO intake--will let her eat whatever appeals to her -CoreTrak with feeds at 40/hr today -GI consulted, appreciate their recommendations -PO Protonix BID -Carafate at decreased dose for 1 additional day -Bentyl TID with meals -Vancomycin PO for c. diff -PT/OT -Continue Imodium -Crestor 10 mg daily -Reglan 10 mg TID with meals -Zyprexa 2.5 mg nightly -Peractin 2 mg 4 times daily until 11/08, then increase to 4 mg 4 times daily -Creon 24,000 units TID PO (cannot be administered via tube)  #AKI on CKD4 #Hyponatremia #Hyperkalemia BUN 88, Cr 4.24, UPC ratio elevated. UOP 600 ON Torsemide held today.  Nephrology consulted for assistance, appreciate [place] [facility] for assistance.  Holding free water given hyponatremia, starting IV Lasix 80 mg every 8 hours.  Patient is not a dialysis candidate - Avoid IV fluids. Need to replace IV access - Avoid nephrotoxic medications, will discuss Cymbalta with psychiatry - Trend CMP  #Right upper extremity pain Patient having significant right upper extremity pain with light palpation, as well as significant edema.  We will follow-up RUE Doppler study.  #HFpEF Repeat echo shows EF 65 to 70% and grade 1 diastolic dysfunction as well as aortic sclerosis with mild regurgitation.  Remains significantly volume overloaded, will reassess with nephrology as above. - Restrict IV fluids - Appreciate nephrology recs  #Normocytic  anemia Hgb improving at 8.8. RI hypoproloferative at 1.91.  TIBC was hemolyzed this morning -Trend CBC -f\u TIBC  Code: Full Dispo: Pending PT placement after stabilization.  Linus Galas MD PGY-1 Internal Medicine Resident Pager: [phone] Please contact the on call pager after 5 pm and on weekends at [phone].

## 2022-09-27 NOTE — Progress Notes (Signed)
Occupational Therapy Treatment Patient Details Name: Alicia [name] MRN: [id number] DOB: 1951/03/06 Today's Date: 09/27/2022   History of present illness 71 y.o. female presented to ED 10/4 with FTT. Cortrak placed 10/18 PMhx: CHf, HTN, CVA, T2DM, CKD, vit D deficiency, PAD, depression, HFpEF   OT comments  Patient received in supine and agreeable to work with OT. Patient was discovered to have soiled bed and was assisted with cleaning. Patient rolled to right with mod assist and total assist for cleaning. Patient attempted to roll to left but required max assist and nursing tech assisted with rolling to address cleaning due to patient unable to maintain side lying position.  Patient continues to have BUE shoulder pain and edema at RUE. Acute OT to continue to follow.    Recommendations for follow up therapy are one component of a multi-disciplinary discharge planning process, led by the attending physician.  Recommendations may be updated based on patient status, additional functional criteria and insurance authorization.    Follow Up Recommendations  Skilled nursing-short term rehab (<3 hours/day)    Assistance Recommended at Discharge Frequent or constant Supervision/Assistance  Patient can return home with the following  Two people to help with walking and/or transfers;Help with stairs or ramp for entrance;Direct supervision/assist for medications management;Assist for transportation;Assistance with feeding;A lot of help with bathing/dressing/bathroom   Equipment Recommendations  Wheelchair (measurements OT);Wheelchair cushion (measurements OT);BSC/3in1;Tub/shower seat;Hospital bed    Recommendations for Other Services      Precautions / Restrictions Precautions Precautions: Fall Precaution Comments: Skin integrity, incontinent of bowel Restrictions Weight Bearing Restrictions: No       Mobility Bed Mobility Overal bed mobility: Needs Assistance Bed Mobility:  Rolling Rolling: Mod assist, Max assist         General bed mobility comments: rolled side to side for cleaning with mod assist to roll to right and max assist to roll to left    Transfers                         Balance                                           ADL either performed or assessed with clinical judgement   ADL Overall ADL's : Needs assistance/impaired     Grooming: Wash/dry hands;Wash/dry face;Bed level;Moderate assistance Grooming Details (indicate cue type and reason): with HOB up     Lower Body Bathing: Total assistance;Bed level Lower Body Bathing Details (indicate cue type and reason): rolling in bed to address with mod assist to roll to right and max assist to left Upper Body Dressing : Maximal assistance;Bed level Upper Body Dressing Details (indicate cue type and reason): to change gown                   General ADL Comments: patient soiled upon entry and required assistance to clean    Extremity/Trunk Assessment Upper Extremity Assessment RUE Deficits / Details: chronic R shoulder dysfunction, difficulty with anything above 90 degrees.  Unable to touch her nose, edema RUE Sensation: WNL RUE Coordination: WNL LUE Deficits / Details: Generalized weakness throughout.  Able to touch her nose LUE Sensation: WNL LUE Coordination: WNL            Vision       Perception     Praxis  Cognition Arousal/Alertness: Awake/alert Behavior During Therapy: WFL for tasks assessed/performed Overall Cognitive Status: Within Functional Limits for tasks assessed                           Safety/Judgement: Decreased awareness of safety, Decreased awareness of deficits     General Comments: unaware of day and date        Exercises      Shoulder Instructions       General Comments      Pertinent Vitals/ Pain       Pain Assessment Pain Assessment: Faces Faces Pain Scale: Hurts a little  bit Pain Location: RLE knee during bed moblity and BUE shoulders Pain Descriptors / Indicators: Grimacing Pain Intervention(s): Limited activity within patient's tolerance, Monitored during session, Repositioned  Home Living                                          Prior Functioning/Environment              Frequency  Min 2X/week        Progress Toward Goals  OT Goals(current goals can now be found in the care plan section)  Progress towards OT goals: Not progressing toward goals - comment (limited progress due to weakness and pain)  Acute Rehab OT Goals Patient Stated Goal: get better OT Goal Formulation: With patient Time For Goal Achievement: 09/28/22 Potential to Achieve Goals: Fair ADL Goals Pt Will Perform Eating: with min assist;bed level Pt Will Perform Grooming: with min assist;sitting Pt Will Perform Upper Body Bathing: with min assist;bed level Pt Will Perform Upper Body Dressing: with min assist;bed level Pt Will Perform Lower Body Dressing: with supervision;sit to/from stand Pt Will Transfer to Toilet: with mod assist;stand pivot transfer;bedside commode Pt/caregiver will Perform Home Exercise Program: Increased strength;Both right and left upper extremity;With minimal assist  Plan Discharge plan remains appropriate    Co-evaluation                 AM-PAC OT "6 Clicks" Daily Activity     Outcome Measure   Help from another person eating meals?: A Lot Help from another person taking care of personal grooming?: A Lot Help from another person toileting, which includes using toliet, bedpan, or urinal?: Total Help from another person bathing (including washing, rinsing, drying)?: A Lot Help from another person to put on and taking off regular upper body clothing?: A Lot Help from another person to put on and taking off regular lower body clothing?: Total 6 Click Score: 10    End of Session    OT Visit Diagnosis: Unsteadiness  on feet (R26.81);Muscle weakness (generalized) (M62.81);History of falling (Z91.81)   Activity Tolerance Patient tolerated treatment well   Patient Left in bed;with call bell/phone within reach   Nurse Communication Mobility status        Time: 6761-9509 OT Time Calculation (min): 25 min  Charges: OT General Charges $OT Visit: 1 Visit OT Treatments $Self Care/Home Management : 23-37 mins  Lodema Hong, [place]  Office [place] 09/27/2022, 10:20 AM

## 2022-09-27 NOTE — Progress Notes (Signed)
Nutrition Follow-up  DOCUMENTATION CODES:   Obesity unspecified  INTERVENTION:   - Recommend pursuing permanent enteral access if within [place] as pt's PO intake has not improved at all over 34 day hospital admission despite maximizing medical therapy  Continue tube feeds via Cortrak: - Increase Osmolite 1.5 to goal rate of 50 ml/hr (1200 ml/day) - PROSource TF20 60 ml daily  Tube feeding regimen at goal rate provides 1880 kcal, 95 grams of protein, and 914 ml of H2O.  - Continue renal MVI daily, thiamine 100 mg daily, and folic acid 1 mg daily  NUTRITION DIAGNOSIS:   Inadequate oral intake related to inability to eat, nausea as evidenced by meal completion < 50%.  Ongoing, being addressed via TF  GOAL:   Patient will meet greater than or equal to 90% of their needs  Met via TF  MONITOR:   PO intake, Supplement acceptance  REASON FOR ASSESSMENT:   Consult Assessment of nutrition requirement/status  ASSESSMENT:   71 y.o. female admits related to weakness. PMH includes: HTN, DM, CKD4, PAD. Pt is currently receiving medical management for weakness and abdominal pain.  10/12 -  s/p EGD showing duodenitis with duodenal ulcers, nodular mucosa and GEJ, altered mucosa in antrum and prepyloric region of stomach 10/18 - Cortrak placed (tip in fourth portion of duodenum) 10/26 - Cortrak tube became clogged 10/27 - Cortrak patent, diet advanced to Regular with thin liquids 10/28 - transitioned to nocturnal tube feeds, calorie count started 11/01 - NPO, s/p EGD revealing stable duodenal ulcers, Cortrak removed and unable to be replaced, diet advanced to full liquid 11/03 - Cortrak placed (tip in distal stomach), TF resumed at 20 ml/hr, diet advanced to Regular 11/05 - TF rate increased to 40 ml/hr  Spoke with pt at bedside. Palliative NP in room at same time. Pt more lethargic than on previous RD visits. Pt able to communicate somewhat and nodded head when Palliative NP asked if  she was open to permanent enteral access. Pt refused all PO intake aside from 1 small sip of water during RD visit. Fruit on bedside tray from previous meal trays appeared to be untouched. Discussed with MD via secure chat who is okay with increasing tube feeds to goal rate of 50 ml/hr.  Given pt's PO intake has not improved at all over course of 34 day hospital admission despite maximizing medical therapy, recommend obtaining permanent enteral access. Discussed with MD via phone call. Pt is nearing ESRD; however, Nephrology is not offering HD. Per MD, plan for family meeting on Friday of this week. MD to discuss the above with pt's daughter today.  Pt with moderate pitting edema to BUE and BLE. Weight up ~22 kg this admission.  Admit weight: 110 kg Current weight: 132.5 kg  Meal Completion: 0-5% x last 8 documented meals  Medications reviewed and include: bentyl 10 mg QID, Ensure Enlive TID, folic acid, lasix 80 mg q 8 hours, SSI q 4 hours, Creon 24,000 units TID, reglan 10 mg TID, remeron, rena-vit, protonix, sodium bicarb 650 mg BID, sucralfate, thiamine  Labs reviewed: sodium 127, BUN 85, creatinine 4.21, WBC 11.0, hemoglobin 8.0 CBG's: 116-163 x 24 hours  UOP: 500 ml x 24 hours I/O's: +30.4 L since admit  Diet Order:   Diet Order             Diet regular Room service appropriate? Yes; Fluid consistency: Thin  Diet effective now  EDUCATION NEEDS:   Not appropriate for education at this time  Skin:  Skin Assessment: Skin Integrity Issues: Stage I: medial sacrum Other: non-pressure wounds to bilateral thighs  Last BM:  09/27/22 large type 7  Height:   Ht Readings from Last 1 Encounters:  08/24/22 _0  (1.727 m)    Weight:   Wt Readings from Last 1 Encounters:  09/28/22 132.5 kg    Ideal Body Weight:  63.6 kg  BMI:  Body mass index is 44.4 kg/m.  Estimated Nutritional Needs:   Kcal:  1700-1900 kcals  Protein:  80-95 gm  Fluid:  >/=  1.7 L    Gustavus Bryant, MS, RD, LDN Inpatient Clinical Dietitian Please see AMiON for contact information.

## 2022-09-27 NOTE — Progress Notes (Addendum)
[facility] Progress Note   Assessment/ Plan:   AKI on CKD 4: BL possibly 2.5-3 but fluctuates. Previously was followed by Dr Joylene Grapes  - main issue was poor Po intake complicated with urinary retention   - stop IVFs with pleural effusions  - Foley in place now  - dont' expect this BUN to cause uremia- no indication for RRT  - dialysis would not be of utility in this setting- she appears profoundly depressed too but with some duodenal ulcers (which could be contributing to food aversion but not the whole picture) and if she's not eating dialysis essentially prolongs death by starvation.    - Zyprexa replaced remeron recently  - needs palliative care c/s, appreciate assistance  - only 30 mg protein on last UA, will send UP/C to be sure  - stop the free water as Na is dropping and overloaded  - check post void as well now that foley is out  - will start Lasix '80mg'$  q8hrs; likely CRS contributing (diastolic dysfunction G1) as well but certainly can be ATN as well. Regardless she is not a dialysis candidate and she was clear she did not want dialysis.   Depression:  - Zyprexa  - already tried remeron without much improvement  Abd pain: unknown etiology. NG tube in place. Extensive workup.  - endoscopy 11/1- duodenal ulcers, carafate reduced, on PPI   Elevated LFTs:  - per GI  - reorder TTE- EF 65-70% and G1DD    HTN: BP slightly high. Cont current meds  Subjective:    Seen in room, pleasant and able to answer some questions. Still no appetite.      Objective:   BP 116/88 (BP Location: Left Arm)   Pulse 88   Temp 97.8 F (36.6 C) (Oral)   Resp 20   Ht '5\' 8"'$  (1.727 m)   Wt 116.8 kg   SpO2 99%   BMI 39.15 kg/m   Intake/Output Summary (Last 24 hours) at 09/27/2022 1324 Last data filed at 09/27/2022 1050 Gross per 24 hour  Intake 2520 ml  Output 954 ml  Net 1566 ml   Weight change:   Physical Exam: JEH:UDJS affect, sitting in chair CVS:RRR Resp:  clear HFW:YOVZ Ext: 1+ LE edema  Imaging: [place] EKG SITE RITE  Result Date: 09/26/2022 If Site Rite image not attached, placement could not be confirmed due to current cardiac rhythm.   Labs: BMET Recent Labs  Lab 09/21/22 1313 09/22/22 1026 09/24/22 1400 09/24/22 1433 09/26/22 0403 09/27/22 0544  NA 130* 134* 131* 132* 131* 128*  K 4.3 3.8 4.0 4.0 4.4 4.6  CL 95* 100 99 100 100 97*  CO2  --  19* 17* 18* 16* 16*  GLUCOSE 68* 76 185* 187* 174* 121*  BUN 55* 66* 70* 69* 79* 88*  CREATININE 3.60* 3.73* 3.85* 3.99* 4.08* 4.24*  CALCIUM  --  8.0* 7.6* 7.6* 7.8* 7.7*  PHOS  --   --  4.8*  --   --   --    CBC Recent Labs  Lab 09/21/22 1313 09/24/22 1433 09/26/22 0403 09/27/22 0544  WBC  --  8.2 9.0 12.8*  HGB 12.6 8.1* 8.3* 8.8*  HCT 37.0 24.0* 24.6* 25.6*  MCV  --  98.8 98.4 97.0  PLT  --  345 333 288    Medications:     acetaminophen  650 mg Rectal Q6H   Or   acetaminophen (TYLENOL) oral liquid 160 mg/5 mL  1,000 mg Oral Q6H  amLODipine  10 mg Oral Daily   Chlorhexidine Gluconate Cloth  6 each Topical Daily   cyproheptadine  2 mg Per Tube QID   dicyclomine  10 mg Oral TID AC & HS   [START ON 09/28/2022] DULoxetine  30 mg Oral Q breakfast   enoxaparin (LOVENOX) injection  30 mg Subcutaneous Q24H   feeding supplement  237 mL Oral TID BM   feeding supplement (PROSource TF20)  60 mL Per Tube Daily   folic acid  1 mg Oral Daily   free water  200 mL Per Tube Q4H   insulin aspart  0-9 Units Subcutaneous Q4H   lipase/protease/amylase  24,000 Units Oral TID   metoCLOPramide  10 mg Oral TID AC   multivitamin  1 tablet Per Tube QHS   OLANZapine zydis  2.5 mg Oral QHS   mouth rinse  15 mL Mouth Rinse 4 times per day   pantoprazole  40 mg Oral BID   rosuvastatin  10 mg Per Tube QPM   sodium bicarbonate  650 mg Oral BID   sucralfate  1 g Oral BID   thiamine  100 mg Per Tube Daily   vancomycin  125 mg Per Tube QID   09/27/2022, 1:24 PM

## 2022-09-27 NOTE — Progress Notes (Signed)
MD aware of Foley presence greater than 3 days, ok to start voiding trials per MD, Foley removed and external catheter placed, will cont. To monitor

## 2022-09-27 NOTE — Progress Notes (Addendum)
Dear Doctor: This patient has been identified as a candidate for CVC for the following reason (s): poor veins/poor circulatory system (CHF, COPD, emphysema, diabetes, steroid use, IV drug abuse, etc.) and restarts due to phlebitis and infiltration in 24 hours/ multiple VAST nurse have seen this patient. If you agree, please write an order for the indicated device. Thank you for supporting the early vascular access assessment program.

## 2022-09-27 NOTE — Progress Notes (Signed)
Breakfast set up for patient., offered pt. Her dentures and pt. Refused, attempted to assist pt. With breakfast, pt. Cont. To refuse to eat  Anastasio Auerbach

## 2022-09-27 NOTE — Plan of Care (Signed)
Problem: Education: Goal: Ability to describe self-care measures that may prevent or decrease complications (Diabetes Survival Skills Education) will improve 09/27/2022 1140 by Anastasio Auerbach, RN Outcome: Not Progressing  Goal: Individualized Educational Video(s) 09/27/2022 1140 by Anastasio Auerbach, RN Outcome: Not Progressing  Problem: Coping: Goal: Ability to adjust to condition or change in health will improve 09/27/2022 1140 by Anastasio Auerbach, RN Outcome: Not Progressing   Problem: Fluid Volume: Goal: Ability to maintain a balanced intake and output will improve 09/27/2022 1140 by Anastasio Auerbach, RN Outcome: Not Progressing    Problem: Health Behavior/Discharge Planning: Goal: Ability to identify and utilize available resources and services will improve 09/27/2022 1140 by Anastasio Auerbach, RN Outcome: Not Progressing  Outcome: Progressing Goal: Ability to manage health-related needs will improve 09/27/2022 1140 by Anastasio Auerbach, RN Outcome: Not Progressing    Problem: Metabolic: Goal: Ability to maintain appropriate glucose levels will improve 09/27/2022 1140 by Anastasio Auerbach, RN Outcome: Not Progressing    Problem: Nutritional: Goal: Maintenance of adequate nutrition will improve 09/27/2022 1140 by Anastasio Auerbach, RN Outcome: Not Progressing  Goal: Progress toward achieving an optimal weight will improve 09/27/2022 1140 by Anastasio Auerbach, RN Outcome: Not Progressing    Problem: Skin Integrity: Goal: Risk for impaired skin integrity will decrease 09/27/2022 1140 by Anastasio Auerbach, RN Outcome: Not Progressing    Problem: Tissue Perfusion: Goal: Adequacy of tissue perfusion will improve 09/27/2022 1140 by Anastasio Auerbach, RN Outcome: Not Progressing  Problem: Education: Goal: Knowledge of General Education information will improve Description: Including pain rating scale, medication(s)/side effects and non-pharmacologic comfort measures 09/27/2022 1140 by Anastasio Auerbach, RN Outcome: Not Progressing    Problem: Health Behavior/Discharge Planning: Goal: Ability to manage health-related needs will improve 09/27/2022 1140 by Anastasio Auerbach, RN Outcome: Not Progressing    Problem: Clinical Measurements: Goal: Ability to maintain clinical measurements within normal limits will improve 09/27/2022 1140 by Anastasio Auerbach, RN Outcome: Not Progressing  Goal: Will remain free from infection 09/27/2022 1140 by Anastasio Auerbach, RN Outcome: Not Progressing  Goal: Diagnostic test results will improve 09/27/2022 1140 by Anastasio Auerbach, RN Outcome: Not Progressing  Goal: Respiratory complications will improve 09/27/2022 1140 by Anastasio Auerbach, RN Outcome: Not Progressing  Goal: Cardiovascular complication will be avoided 09/27/2022 1140 by Anastasio Auerbach, RN Outcome: Not Progressing    Problem: Activity: Goal: Risk for activity intolerance will decrease 09/27/2022 1140 by Anastasio Auerbach, RN Outcome: Not Progressing    Problem: Nutrition: Goal: Adequate nutrition will be maintained 09/27/2022 1140 by Anastasio Auerbach, RN Outcome: Not Progressing  Problem: Coping: Goal: Level of anxiety will decrease 09/27/2022 1140 by Anastasio Auerbach, RN Outcome: Not Progressing    Problem: Elimination: Goal: Will not experience complications related to bowel motility 09/27/2022 1140 by Anastasio Auerbach, RN Outcome: Not Progressing  Goal: Will not experience complications related to urinary retention 09/27/2022 1140 by Anastasio Auerbach, RN Outcome: Not Progressing    Problem: Pain Managment: Goal: General experience of comfort will improve 09/27/2022 1140 by Anastasio Auerbach, RN Outcome: Not Progressing    Problem: Safety: Goal: Ability to remain free from injury will improve 09/27/2022 1140 by Anastasio Auerbach, RN Outcome: Not Progressing    Problem: Skin Integrity: Goal: Risk for impaired skin integrity will decrease 09/27/2022 1140 by Anastasio Auerbach,  RN Outcome: Not Progressing    Problem: Nutrition Goal: Patient maintains adequate hydration 09/27/2022 1140 by Anastasio Auerbach, RN Outcome: Not Progressing  Goal: Patient maintains weight 09/27/2022 1140 by Anastasio Auerbach, RN Outcome: Not Progressing  Goal: Patient/Family demonstrates  understanding of diet 09/27/2022 1140 by Anastasio Auerbach, RN Outcome: Not Progressing  Goal: Patient/Family independently completes tube feeding 09/27/2022 1140 by Anastasio Auerbach, RN Outcome: Not Progressing  Goal: Patient will have no more than 5 lb weight change during LOS 09/27/2022 1140 by Anastasio Auerbach, RN Outcome: Not Progressing  Goal: Patient will utilize adaptive techniques to administer nutrition 09/27/2022 1140 by Anastasio Auerbach, RN Outcome: Not Progressing  Goal: Patient will verbalize dietary restrictions 09/27/2022 1140 by Anastasio Auerbach, RN Outcome: Not Progressing

## 2022-09-27 NOTE — Progress Notes (Signed)
Physical Therapy Treatment Patient Details Name: Alicia [name] MRN: [id number] DOB: 10/11/1951 Today's Date: 09/27/2022   History of Present Illness 71 y.o. female presented to ED 10/4 with FTT. Cortrak placed 10/18 PMhx: CHf, HTN, CVA, T2DM, CKD, vit D deficiency, PAD, depression, HFpEF    PT Comments    Patient resting in bed and agreeable to mobilize OOB with lift. Pt noted to have significant edema in all extremities and abdomen with Rt arm drainage at hand. Pt required Mod-Max assist for rolling Lt/Rt in bed for pad placement and 2+ maximove transfer bed>chair. Repositioned for more upright sitting and LE's elevated on pillows. Pt instructed on ankle pumps with AAROM to complete 20 reps bil. Pt unable to lift bil UE's independently and AAROM completed for shoulder flexion. EOS pt denied discomfort and alarm set with lift pad under pt. Will continue to progress as able and continue to recommend SNF vs LTC facility. Will progress as able.   Recommendations for follow up therapy are one component of a multi-disciplinary discharge planning process, led by the attending physician.  Recommendations may be updated based on patient status, additional functional criteria and insurance authorization.  Follow Up Recommendations  Skilled nursing-short term rehab (<3 hours/day) (vs LTC) Can patient physically be transported by private vehicle: No   Assistance Recommended at Discharge Frequent or constant Supervision/Assistance  Patient can return home with the following Assistance with cooking/housework;Direct supervision/assist for medications management;Assist for transportation;Two people to help with walking and/or transfers;Help with stairs or ramp for entrance;A lot of help with bathing/dressing/bathroom;Assistance with feeding   Equipment Recommendations  Wheelchair (measurements PT);Wheelchair cushion (measurements PT);Hospital bed (lift)    Recommendations for Other Services        Precautions / Restrictions Precautions Precautions: Fall Precaution Comments: Skin integrity, incontinent of bowel Restrictions Weight Bearing Restrictions: No     Mobility  Bed Mobility Overal bed mobility: Needs Assistance Bed Mobility: Rolling Rolling: Mod assist, Max assist         General bed mobility comments: rolled side to side for cleaning with mod assist to roll to right and max assist to roll to left    Transfers Overall transfer level: Needs assistance Equipment used: Ambulation equipment used Transfers: Bed to chair/wheelchair/BSC               Transfer via Lift Equipment: Maximove  Ambulation/Gait                   Stairs             Wheelchair Mobility    Modified Rankin (Stroke Patients Only)       Balance                                            Cognition Arousal/Alertness: Awake/alert Behavior During Therapy: WFL for tasks assessed/performed Overall Cognitive Status: Impaired/Different from baseline Area of Impairment: Orientation                 Orientation Level: Disoriented to, Time       Safety/Judgement: Decreased awareness of safety, Decreased awareness of deficits   Problem Solving: Decreased initiation, Slow processing, Requires verbal cues, Difficulty sequencing General Comments: unaware of day and date        Exercises General Exercises - Upper Extremity Shoulder Flexion: AAROM, Both, 5 reps General Exercises - Lower Extremity Ankle Circles/Pumps: AAROM,  Both, 20 reps    General Comments General comments (skin integrity, edema, etc.): significant edema noted throughout with greater edema Rt>Lt and weaping noted along Rt UE.      Pertinent Vitals/Pain Pain Assessment Pain Assessment: Faces Faces Pain Scale: Hurts even more Pain Location: generalized and Rt UE Pain Descriptors / Indicators: Grimacing Pain Intervention(s): Limited activity within patient's tolerance,  Monitored during session, Repositioned    Home Living                          Prior Function            PT Goals (current goals can now be found in the care plan section) Acute Rehab PT Goals Patient Stated Goal: Wants to get OOB PT Goal Formulation: With patient Time For Goal Achievement: 09/29/22 Potential to Achieve Goals: Fair Progress towards PT goals:  (slow progress)    Frequency    Min 2X/week      PT Plan Current plan remains appropriate    Co-evaluation              AM-PAC PT "6 Clicks" Mobility   Outcome Measure  Help needed turning from your back to your side while in a flat bed without using bedrails?: Total Help needed moving from lying on your back to sitting on the side of a flat bed without using bedrails?: Total Help needed moving to and from a bed to a chair (including a wheelchair)?: Total Help needed standing up from a chair using your arms (e.g., wheelchair or bedside chair)?: Total Help needed to walk in hospital room?: Total Help needed climbing 3-5 steps with a railing? : Total 6 Click Score: 6    End of Session   Activity Tolerance: Patient tolerated treatment well Patient left: in chair;with call bell/phone within reach;with chair alarm set (lift in chair) Nurse Communication: Mobility status;Need for lift equipment PT Visit Diagnosis: Other abnormalities of gait and mobility (R26.89);History of falling (Z91.81);Muscle weakness (generalized) (M62.81)     Time: 6629-4765 PT Time Calculation (min) (ACUTE ONLY): 48 min  Charges:  $Therapeutic Activity: 23-37 mins                     Verner Mould, DPT Acute Rehabilitation Services Office [phone]  09/27/22 1:03 PM

## 2022-09-27 NOTE — Consult Note (Signed)
Brief Psychiatry Consult Note  The patient was last seen by the psychiatry service on 11/1. Briefly, pt with long hospitalization for failure to thrive with worsening symptoms of a motivation and confusion over the past few days. She has been seen by psychiatry thus far on 10/21, 10/23, 10/31 and 11/1; she has endorsed no to moderate symptoms of depression on serial evals. Had been endorsing worsening symptoms of anhedonia and amotivation to primary team over the last week.   We were reconsulted by Dr. Jodell Cipro today for: "Patient appears significantly more depressed today. Please reassess and see if appropriate for any other management (discussed ketamine, ECT, etc. During rounds). Additionally, unsure if cymbalta is helping much and is risky given her worsening GFR. If she is evaluated as primary MDD, would want different regimen. She appears more somnolent, confused this morning, please formally assess decision making capacity"   Saw pt about an hour after consult placed. She was awake but not alert. She followed some simple commands (thumbs up, smile, look surprised, etc). She squeezed finger on command. No facial droop, symmetric movements. She did not vocalize and required frequent verbal prompting and rubbing/squeezing hand to maintain wakefulness. On an attempt to assess attention, she squeezed for the letter "A" on A and C in ABC and essentially randomly on SAVEAHAART. On review of documentation through the day, has had waxing and waning alertness - had a conversation with nephrology team an hour or two after above assessment, but was confused with primary team this AM. Spoke to primary team and PT - markedly less engaged over last week or so.   To address specific questions:  1) Meds  - reduce duloxetine from 60 to 30 given worsening kidney function and particularly hyponatremia  - will dc  zyprexa at present, no benefit, somnolent.   - have restarted remeron - seemed to help for mood but not  appetite - probably dc duloxetine if kidney function does not improve in next day or so  2) Other management  - Discussed ECT with Dr. Weber Cooks  - if GI and kidney addressed (would also likely reverse mental status change), would consider ECT if pt awake and able to assent.  - likely challenging from anesthesia perspective  - ketamine logistically difficult, unclear how much it would benefit this pt who has borderline encephalopathy   Psych to continue to follow   I personally spent 35 minutes on the unit in direct patient care. The direct patient care time included face-to-face time with the patient, reviewing the patient's chart, communicating with other professionals, and coordinating care. Greater than 50% of this time was spent in counseling or coordinating care with the patient regarding goals of hospitalization, psycho-education, and discharge planning needs.   Serenidy Waltz A Jjesus Dingley

## 2022-09-27 NOTE — Progress Notes (Signed)
Pt. Has not voided yet, bladder scan 45 ml, MD aware  Anastasio Auerbach

## 2022-09-28 DIAGNOSIS — N184 Chronic kidney disease, stage 4 (severe): Secondary | ICD-10-CM | POA: Diagnosis not present

## 2022-09-28 DIAGNOSIS — D649 Anemia, unspecified: Secondary | ICD-10-CM | POA: Diagnosis not present

## 2022-09-28 DIAGNOSIS — I130 Hypertensive heart and chronic kidney disease with heart failure and stage 1 through stage 4 chronic kidney disease, or unspecified chronic kidney disease: Secondary | ICD-10-CM | POA: Diagnosis not present

## 2022-09-28 DIAGNOSIS — I509 Heart failure, unspecified: Secondary | ICD-10-CM | POA: Diagnosis not present

## 2022-09-28 DIAGNOSIS — R531 Weakness: Secondary | ICD-10-CM | POA: Diagnosis not present

## 2022-09-28 DIAGNOSIS — R1084 Generalized abdominal pain: Secondary | ICD-10-CM | POA: Diagnosis not present

## 2022-09-28 DIAGNOSIS — E876 Hypokalemia: Secondary | ICD-10-CM | POA: Diagnosis not present

## 2022-09-28 DIAGNOSIS — R627 Adult failure to thrive: Secondary | ICD-10-CM | POA: Diagnosis not present

## 2022-09-28 DIAGNOSIS — N179 Acute kidney failure, unspecified: Secondary | ICD-10-CM | POA: Diagnosis not present

## 2022-09-28 DIAGNOSIS — N390 Urinary tract infection, site not specified: Secondary | ICD-10-CM | POA: Diagnosis not present

## 2022-09-28 DIAGNOSIS — E1110 Type 2 diabetes mellitus with ketoacidosis without coma: Secondary | ICD-10-CM | POA: Diagnosis not present

## 2022-09-28 DIAGNOSIS — G8929 Other chronic pain: Secondary | ICD-10-CM | POA: Diagnosis not present

## 2022-09-28 DIAGNOSIS — G9340 Encephalopathy, unspecified: Secondary | ICD-10-CM | POA: Diagnosis not present

## 2022-09-28 LAB — COMPREHENSIVE METABOLIC PANEL
ALT: 44 U/L (ref 0–44)
AST: 30 U/L (ref 15–41)
Albumin: <1.5 g/dL — ABNORMAL LOW (ref 3.5–5.0)
Alkaline Phosphatase: 366 U/L — ABNORMAL HIGH (ref 38–126)
Anion gap: 14 (ref 5–15)
BUN: 85 mg/dL — ABNORMAL HIGH (ref 8–23)
CO2: 15 mmol/L — ABNORMAL LOW (ref 22–32)
Calcium: 7.7 mg/dL — ABNORMAL LOW (ref 8.9–10.3)
Chloride: 98 mmol/L (ref 98–111)
Creatinine, Ser: 4.21 mg/dL — ABNORMAL HIGH (ref 0.44–1.00)
GFR, Estimated: 11 mL/min — ABNORMAL LOW (ref >60)
Glucose, Bld: 131 mg/dL — ABNORMAL HIGH (ref 70–99)
Potassium: 3.9 mmol/L (ref 3.5–5.1)
Sodium: 127 mmol/L — ABNORMAL LOW (ref 135–145)
Total Bilirubin: 0.6 mg/dL (ref 0.3–1.2)
Total Protein: 4.4 g/dL — ABNORMAL LOW (ref 6.5–8.1)

## 2022-09-28 LAB — GLUCOSE, CAPILLARY
Glucose-Capillary: 116 mg/dL — ABNORMAL HIGH (ref 70–99)
Glucose-Capillary: 125 mg/dL — ABNORMAL HIGH (ref 70–99)
Glucose-Capillary: 130 mg/dL — ABNORMAL HIGH (ref 70–99)
Glucose-Capillary: 131 mg/dL — ABNORMAL HIGH (ref 70–99)
Glucose-Capillary: 171 mg/dL — ABNORMAL HIGH (ref 70–99)
Glucose-Capillary: 186 mg/dL — ABNORMAL HIGH (ref 70–99)

## 2022-09-28 LAB — CBC
HCT: 24 % — ABNORMAL LOW (ref 36.0–46.0)
Hemoglobin: 8 g/dL — ABNORMAL LOW (ref 12.0–15.0)
MCH: 33.6 pg (ref 26.0–34.0)
MCHC: 33.3 g/dL (ref 30.0–36.0)
MCV: 100.8 fL — ABNORMAL HIGH (ref 80.0–100.0)
Platelets: 282 10*3/uL (ref 150–400)
RBC: 2.38 MIL/uL — ABNORMAL LOW (ref 3.87–5.11)
RDW: 21.9 % — ABNORMAL HIGH (ref 11.5–15.5)
WBC: 11 10*3/uL — ABNORMAL HIGH (ref 4.0–10.5)
nRBC: 0 % (ref 0.0–0.2)

## 2022-09-28 LAB — IRON AND TIBC: Iron: 19 ug/dL — ABNORMAL LOW (ref 28–170)

## 2022-09-28 MED ORDER — MIRTAZAPINE 15 MG PO TBDP
15.0000 mg | ORAL_TABLET | Freq: Every day | ORAL | Status: DC
  Administered 2022-09-28: 15 mg via ORAL
  Filled 2022-09-28 (×2): qty 1

## 2022-09-28 MED ORDER — OSMOLITE 1.5 CAL PO LIQD
1000.0000 mL | ORAL | Status: DC
  Administered 2022-09-28: 1000 mL
  Filled 2022-09-28 (×2): qty 1185

## 2022-09-28 MED ORDER — PROCHLORPERAZINE MALEATE 10 MG PO TABS
10.0000 mg | ORAL_TABLET | Freq: Four times a day (QID) | ORAL | Status: DC | PRN
  Administered 2022-09-28: 10 mg via ORAL
  Filled 2022-09-28 (×2): qty 1

## 2022-09-28 MED ORDER — SODIUM CHLORIDE 1 G PO TABS
1.0000 g | ORAL_TABLET | Freq: Two times a day (BID) | ORAL | Status: DC
  Administered 2022-09-28 – 2022-09-29 (×3): 1 g via ORAL
  Filled 2022-09-28 (×3): qty 1

## 2022-09-28 MED ORDER — SODIUM BICARBONATE 650 MG PO TABS
1300.0000 mg | ORAL_TABLET | Freq: Two times a day (BID) | ORAL | Status: DC
  Administered 2022-09-28 – 2022-09-29 (×2): 1300 mg via ORAL
  Filled 2022-09-28 (×3): qty 2

## 2022-09-28 NOTE — Progress Notes (Signed)
Cross cover made aware patient PO intake vs output. Patient refuses PO intake. Made multiple attempts to offer PO fluids during the night. Patient bladder scan this morning was 151m. Cross cover reassure urinary protocol for patient.

## 2022-09-28 NOTE — Progress Notes (Signed)
Subjective:   Summary: Alicia [name] is a 71 y.o. year old female currently admitted on the [facility] HD#34 for diffuse abdominal pain as well as severely decreased PO intake likely multifactorial in etiology due to pancreatic insufficiency, chronic IMA occlusion, duodenal ulcers.  Overnight Events:  -Still no IV access - Appreciate, psych/nephrology recs. -Had extensive conversation today regarding [name] plan.  She has been in the hospital for almost 40 days and has not showed any improvement with artificial feeds.  Her duodenal ulcers have not healed, she is now facing ESRD and is not a dialysis candidate, and overall prognosis is very poor with or without GJ and artificial feeds.  We have had extensive conversations regarding extraordinary psychiatric treatments including ECT and ketamine and the patient likely would not significantly benefit from either and both carry significant risk for her.  We will discuss her poor prognosis with her and her family and further assess for changes in a plan during family meeting.  Objective:  Vital signs in last 24 hours: Vitals:   09/28/22 0500 09/28/22 0533 09/28/22 0555 09/28/22 0806  BP:  123/66  106/83  Pulse:  88  (!) 101  Resp:  18  17  Temp:   97.9 F (36.6 C) 97.7 F (36.5 C)  TempSrc:   Oral Oral  SpO2:  100%  100%  Weight: 132.5 kg     Height:       Supplemental O2: Room Air    Physical Exam:  Constitutional: NAD HEENT: Coretrack in place Cardiovascular: RRR, no murmurs rubs or gallops Pulmonary: Normal respiratory effort on room air Abdominal: minimal diffuse TTP, no guarding or rebound.  Distended but soft.  Periumbilical hernia unchanged.  Extremities: 2+ pitting edema of lower extremities bilaterally.  Upper extremities grossly edematous.  right upper extremity tender on palpation. Neuro/Psych: Affect very flat today, she is oriented and interactive however   Intake/Output Summary (Last 24  hours) at 09/28/2022 1615 Last data filed at 09/28/2022 1451 Gross per 24 hour  Intake 1239.5 ml  Output 0 ml  Net 1239.5 ml   Net IO Since Admission: 31,103 mL [09/28/22 1615] 3 a.m. bladder scan 180 cc, 1400 bladder scan 316 cc Foley replaced  Pertinent Labs:    Latest Ref Rng & Units 09/28/2022    7:25 AM 09/27/2022    5:44 AM 09/26/2022    4:03 AM  CBC  WBC 4.0 - 10.5 K/uL 11.0  12.8  9.0   Hemoglobin 12.0 - 15.0 g/dL 8.0  8.8  8.3   Hematocrit 36.0 - 46.0 % 24.0  25.6  24.6   Platelets 150 - 400 K/uL 282  288  333        Latest Ref Rng & Units 09/28/2022    7:25 AM 09/27/2022    5:44 AM 09/26/2022    4:03 AM  CMP  Glucose 70 - 99 mg/dL 131  121  174   BUN 8 - 23 mg/dL 85  88  79   Creatinine 0.44 - 1.00 mg/dL 4.21  4.24  4.08   Sodium 135 - 145 mmol/L 127  128  131   Potassium 3.5 - 5.1 mmol/L 3.9  4.6  4.4   Chloride 98 - 111 mmol/L 98  97  100   CO2 22 - 32 mmol/L '15  16  16   '$ Calcium 8.9 - 10.3 mg/dL 7.7  7.7  7.8   Total Protein 6.5 - 8.1 g/dL 4.4  4.4  4.3   Total Bilirubin 0.3 - 1.2 mg/dL 0.6  0.5  0.5   Alkaline Phos 38 - 126 U/L 366  404  395   AST 15 - 41 U/L 30  41  48   ALT 0 - 44 U/L 44  50  53   Last 3 CBGs 130, 131, 125 Urine protein to creatinine ratio 0.53, total urine protein 31 Ferritin 339, reticulocyte count percent 1.7, absolute 117   Imaging:    Assessment/Plan:   Patient Summary: Alicia [name] is a 71 y.o. with a pertinent PMH of HFpEF, HTN, DMII, CKD4, PAD, prior CVA, who presented with progressive weakness and post-prandial abdominal pain and admitted for decreased functional ability, diffuse abdominal pain, and chronic diarrhea likely multifactorial in etiology due to pancreatic insufficiency, duodenal ulcers.  We will continue to discuss poor prognosis and consider options during family meeting this week  #Abdominal Pain of Unkown Etiology #Chronic diarrhea #IMA occlusion #Duodenal ulcers #Pancreatic insufficiency #Lack of PO  Intake  #Severe food aversion #C. Diff infection This is hospital day 35, the patient has not shown any improved p.o. intake even with maximal medical management and is also declining multiple interventions that would improve her symptoms (Creon, Protonix p.o, etc).  She has been receiving enteral feeds for approximately 3 weeks now, but her albumin continues to be downtrending and her total protein is still low.  Her duodenal ulcers do not seem to be healing and she is now progressing to ESRD as below.  Previously have considered GJ placement but given lack of improvement on artificial feeds so far, do not think GJ would provide her any benefit if she does not improve her p.o. intake, which we have been working on for weeks now.  ECT and ketamine would be logistically very difficult or impossible and are very unlikely to change her outcome.  During the course of her prolonged hospitalization, she has faced multiple complications including C. difficile infection and significant deconditioning.  Overall, her prognosis is very poor and we have exhausted all available treatment options.  Prognosis discussed with patient and family and will reassess goals of care during upcoming family meeting.  If she does not improve her p.o. intake, I believe the only option would be palliative/comfort based care. Plan:  -Discuss goals of care during family meeting, appreciate palliative assistance - Psych reconsulted, appreciate recs -Increase PO intake--will let her eat whatever appeals to her -CoreTrak with feeds at 50/hr -GI consulted, appreciate their recommendations -PO Protonix BID -Carafate at decreased dose for 1 additional day -Bentyl TID with meals -Vancomycin PO for c. diff -PT/OT -Continue Imodium -Crestor 10 mg daily -Reglan 10 mg TID with meals -Remeron 15 mg nightly -Peractin 2 mg 4 times daily until 11/08, then increase to 4 mg 4 times daily -Creon 24,000 units TID PO (cannot be administered via  tube)  #AKI on CKD4 #Hyponatremia #Hyperkalemia BUN 85, Cr 4.21, UPC ratio elevated.  Hyponatremic at 127.  Oliguric and retaining urine again today.  Foley replaced.  Nephrology consulted for assistance, appreciate [facility]rology for assistance.  Holding free water, supplementing sodium for hyponatremia, starting PO Lasix 80 mg every 8 hours.  Patient is not a dialysis candidate - Avoid IV fluids. Need to replace IV access, but would hold off on central access. - Avoid nephrotoxic medications, will consider DC Cymbalta if GFR worsens - Trend CMP  #Right upper  extremity pain RUE Doppler negative  #HFpEF Repeat echo shows EF 65 to 70% and grade 1 diastolic dysfunction as well as aortic sclerosis with mild regurgitation.  Remains significantly volume overloaded, will reassess with nephrology as above. - Restrict IV fluids - Appreciate nephrology recs  #Normocytic anemia Hgb improving at 8.0. RI hypoproloferative at 1.91.  TIBC was hemolyzed this morning.  Overall, many etiologies and overall no treatment indicated at this point. -Trend CBC -f\u TIBC  Code: Full Dispo: Pending PT placement after stabilization.  Linus Galas MD PGY-1 Internal Medicine Resident Pager: [phone] Please contact the on call pager after 5 pm and on weekends at [phone].

## 2022-09-28 NOTE — Progress Notes (Signed)
Foley re-inserted per verbal MD order, immediate UOP 325,   Alicia [name]

## 2022-09-28 NOTE — Consult Note (Signed)
[facility] Followup Face-to-Face Psychiatric Evaluation   Service Date: September 28, 2022 LOS:  LOS: 29 days    Assessment  The patient was last seen by the psychiatry service on 11/1. Briefly, pt with long hospitalization for failure to thrive with worsening symptoms of a motivation and confusion over the past few days. She has been seen by psychiatry thus far on 10/21, 10/23, 10/31 and 11/1; she has endorsed no to moderate symptoms of depression on serial evals. Had been endorsing worsening symptoms of anhedonia and amotivation to primary team over the last week.    We were reconsulted by Dr. Jodell Cipro on 11/7 for: "Patient appears significantly more depressed today. Please reassess and see if appropriate for any other management (discussed ketamine, ECT, etc. During rounds). Additionally, unsure if cymbalta is helping much and is risky given her worsening GFR. If she is evaluated as primary MDD, would want different regimen. She appears more somnolent, confused this morning, please formally assess decision making capacity"  Seen on 11/7: patient lethargic, barely able to follow simple commands, unable to converse.   Seen on 11/8: awake, alert, able to converse about abstract concepts (e.g. able to state the reason for placement of a feeding tube).  Nephrology states in their note today her BUN in the 80's is unlikely to be causing altered mentation. As noted above, she has reported mild depression or none at all to the psychiatry team. She reports having no appetite and therefore is not interested in eating. Talked about her case again with [facility] consult liaison and ECT psychiatrist Dr. Weber Cooks. In the context of waxing and waning mentation he feels the patient is not appropriate for ECT. Could consider this if she demonstrates consistent mentation consistent with decision making capacity. Meds as below. Will see patient again on 11/10.  Diagnoses:  Active Hospital  problems: Principal Problem:   Chronic generalized abdominal pain Active Problems:   Acute worsening of stage 4 chronic kidney disease (HCC)   Generalized weakness   Pressure injury of skin   Nausea and vomiting   Diarrhea   Duodenal ulcer   Mucosal abnormality of stomach   Mucosal abnormality of esophagus   Food aversion   Hypoalbuminemia due to protein-calorie malnutrition ([place])     Plan  ## Safety and Observation Level:  - Based on my clinical evaluation, I estimate the patient to be at low risk of self harm in the current setting - At this time, we recommend a routine level of observation. This decision is based on my review of the chart including patient's history and current presentation, interview of the patient, mental status examination, and consideration of suicide risk including evaluating suicidal ideation, plan, intent, suicidal or self-harm behaviors, risk factors, and protective factors. This judgment is based on our ability to directly address suicide risk, implement suicide prevention strategies and develop a safety plan while the patient is in the clinical setting. Please contact our team if there is a concern that risk level has changed.   ## Medications:  -- D/C Cymbalta given worsening renal function -- continue Periactin   ## Medical Decision Making Capacity:  Not assessed  ## Further Work-up:  -- as per primary   ## Disposition:  -- TBD  ## Behavioral / Environmental:  -- NA  ##Legal Status VOL  Thank you for this consult request. Recommendations have been communicated to the primary team.  We will follow at this time.   Corky Sox, MD  Medical History: Past Medical History:  Diagnosis Date   Allergy    Arthritis    Chronic headaches    Constipation    Depression    Diabetes mellitus    GERD (gastroesophageal reflux disease)    Hyperlipidemia    Hypertension    Obesity    PAD (peripheral artery disease) ([place])    stage 4     Stroke [facility])     Surgical History: Past Surgical History:  Procedure Laterality Date   ABDOMINAL AORTOGRAM W/LOWER EXTREMITY N/A 12/17/2018   Procedure: ABDOMINAL AORTOGRAM W/LOWER EXTREMITY;  Surgeon: Waynetta Sandy, MD;  Location: [place] [facility];  Service: Cardiovascular;  Laterality: N/A;   ARTERY BIOPSY Left 03/17/2022   Procedure: BIOPSY TEMPORAL ARTERY;  Surgeon: Cherre Robins, MD;  Location: [place];  Service: Vascular;  Laterality: Left;   BIOPSY  09/01/2022   Procedure: BIOPSY;  Surgeon: Yetta Flock, MD;  [facility];  Service: Gastroenterology;;   ESOPHAGOGASTRODUODENOSCOPY (EGD) WITH PROPOFOL N/A 09/01/2022   Procedure: ESOPHAGOGASTRODUODENOSCOPY (EGD) WITH PROPOFOL;  Surgeon: Yetta Flock, MD;  Location: [place];  Service: Gastroenterology;  Laterality: N/A;   ESOPHAGOGASTRODUODENOSCOPY (EGD) WITH PROPOFOL N/A 09/21/2022   Procedure: ESOPHAGOGASTRODUODENOSCOPY (EGD) WITH PROPOFOL;  Surgeon: Jackquline Denmark, MD;  Location: [place];  Service: Gastroenterology;  Laterality: N/A;   FEMORAL BYPASS  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 Bridgett Larsson   PERIPHERAL VASCULAR ATHERECTOMY Left 12/17/2018   Procedure: PERIPHERAL VASCULAR ATHERECTOMY;  Surgeon: Waynetta Sandy, MD;  Location: [place] [facility];  Service: Cardiovascular;  Laterality: Left;   PERIPHERAL VASCULAR BALLOON ANGIOPLASTY Left 12/17/2018   Procedure: PERIPHERAL VASCULAR BALLOON ANGIOPLASTY;  Surgeon: Waynetta Sandy, MD;  Location: [place] [facility];  Service: Cardiovascular;  Laterality: Left;    Medications:   Current Facility-Administered Medications:    acetaminophen (TYLENOL) suppository 650 mg, 650 mg, Rectal, Q6H **OR** acetaminophen (TYLENOL) 160 MG/5ML solution 1,000 mg, 1,000 mg, Oral, Q6H, Nooruddin, Saad, MD, 1,000 mg at 09/28/22 1209   amLODipine (NORVASC) tablet 10 mg, 10 mg, Oral, Daily, Nooruddin, Saad, MD,  10 mg at 09/28/22 0981   Chlorhexidine Gluconate Cloth 2 % PADS 6 each, 6 each, Topical, Daily, Aldine Contes, MD, 6 each at 09/28/22 1914   cyproheptadine (PERIACTIN) 2 MG/5ML syrup 2 mg, 2 mg, Per Tube, QID, Jodell Cipro, Sriramkumar, MD, 2 mg at 09/28/22 1210   dicyclomine (BENTYL) capsule 10 mg, 10 mg, Oral, TID AC & HS, Nooruddin, Saad, MD, 10 mg at 09/28/22 1211   DULoxetine (CYMBALTA) DR capsule 30 mg, 30 mg, Oral, Q breakfast, Cinderella, Margaret A, 30 mg at 09/28/22 0814   enoxaparin (LOVENOX) injection 30 mg, 30 mg, Subcutaneous, Q24H, Axel Filler, MD, 30 mg at 09/27/22 2100   feeding supplement (ENSURE ENLIVE / ENSURE PLUS) liquid 237 mL, 237 mL, Oral, TID BM, Axel Filler, MD, 237 mL at 09/25/22 0903   feeding supplement (OSMOLITE 1.5 CAL) liquid 1,000 mL, 1,000 mL, Per Tube, Continuous, Charise Killian, MD, Last Rate: 50 mL/hr at 09/28/22 1014, 1,000 mL at 09/28/22 1014   feeding supplement (PROSource TF20) liquid 60 mL, 60 mL, Per Tube, Daily, Axel Filler, MD, 60 mL at 78/29/56 2130   folic acid (FOLVITE) tablet 1 mg, 1 mg, Oral, Daily, Nooruddin, Saad, MD, 1 mg at 09/28/22 0814   furosemide (LASIX) tablet 80 mg, 80 mg, Per Tube, Q8H, Atway, Rayann N, DO, 80 mg at 09/28/22 1320  insulin aspart (novoLOG) injection 0-9 Units, 0-9 Units, Subcutaneous, Q4H, Narendra, Nischal, MD, 1 Units at 09/28/22 1209   lipase/protease/amylase (CREON) capsule 24,000 Units, 24,000 Units, Oral, TID, Rick Duff, MD, 24,000 Units at 09/26/22 0917   metoCLOPramide (REGLAN) 5 MG/5ML solution 10 mg, 10 mg, Oral, TID AC, Nooruddin, Saad, MD, 10 mg at 09/28/22 1209   mirtazapine (REMERON SOL-TAB) disintegrating tablet 7.5 mg, 7.5 mg, Oral, QHS, Cinderella, Margaret A, 7.5 mg at 09/27/22 2305   multivitamin (RENA-VIT) tablet 1 tablet, 1 tablet, Per Tube, QHS, Axel Filler, MD, 1 tablet at 09/27/22 2101   Oral care mouth rinse, 15 mL, Mouth Rinse, 4 times per day,  Axel Filler, MD, 15 mL at 09/28/22 1212   Oral care mouth rinse, 15 mL, Mouth Rinse, PRN, Axel Filler, MD   pantoprazole (PROTONIX) EC tablet 40 mg, 40 mg, Oral, BID, Atway, Rayann N, DO, 40 mg at 09/27/22 2101   phenol (CHLORASEPTIC) mouth spray 1 spray, 1 spray, Mouth/Throat, PRN, Rick Duff, MD   phenylephrine-shark liver oil-mineral oil-petrolatum (PREPARATION H) rectal ointment 1 Application, 1 Application, Rectal, BID PRN, Rick Duff, MD   prochlorperazine (COMPAZINE) tablet 10 mg, 10 mg, Oral, Q6H PRN, Dwana Melena, MD, 10 mg at 09/28/22 1320   rosuvastatin (CRESTOR) tablet 10 mg, 10 mg, Per Tube, QPM, Nooruddin, Saad, MD, 10 mg at 09/27/22 1836   sodium bicarbonate tablet 1,300 mg, 1,300 mg, Oral, BID, Dwana Melena, MD   sodium chloride tablet 1 g, 1 g, Oral, BID WC, Dwana Melena, MD   sucralfate (CARAFATE) 1 GM/10ML suspension 1 g, 1 g, Oral, BID, Nooruddin, Saad, MD, 1 g at 09/28/22 0815   thiamine (VITAMIN B1) tablet 100 mg, 100 mg, Per Tube, Daily, Pham, Minh Q, RPH-CPP, 100 mg at 09/28/22 0814   vancomycin (VANCOCIN) 50 mg/mL oral solution SOLN 125 mg, 125 mg, Per Tube, QID, Dareen Piano, Nischal, MD, 125 mg at 09/28/22 1210   Zinc Oxide (TRIPLE PASTE) 12.8 % ointment, , Topical, PRN, Angelica Pou, MD, Given at 09/13/22 1617  Allergies: Allergies  Allergen Reactions   Nifedipine Er Other (See Comments)    Caused nose bleeds in higher doses (90 mg., namely)   Latex Rash   Lipitor [Atorvastatin] Swelling and Other (See Comments)    Lip swelling to lipitor Patient tolerates crestor       Objective  Vital signs:  Temp:  [97.7 F (36.5 C)-97.9 F (36.6 C)] 97.7 F (36.5 C) (11/08 0806) Pulse Rate:  [84-101] 101 (11/08 0806) Resp:  [12-18] 17 (11/08 0806) BP: (106-154)/(66-108) 106/83 (11/08 0806) SpO2:  [99 %-100 %] 100 % (11/08 0806) Weight:  [132.5 kg] 132.5 kg (11/08 0500)  Psychiatric Specialty Exam:  Presentation  General  Appearance:  Appropriate for Environment; Casual  Eye Contact: Fair  Speech: Slow  Speech Volume: Decreased   Mood and Affect  Mood: "Okay"  Affect: Constricted   Thought Process  Thought Processes: Coherent; Goal Directed; Linear  Descriptions of Associations:Intact  Orientation:Full (Time, Place and Person)  Thought Content:Logical  History of Schizophrenia/Schizoaffective disorder: none Duration of Psychotic Symptoms: none Hallucinations: none Ideas of Reference:None  Suicidal Thoughts: denies Homicidal Thoughts: denies  Sensorium  Memory: Immediate Good; Recent Good; Remote Good  Judgment: Fair  Insight: Fair   Community education officer  Concentration: Good  Attention Span: Fair  Recall: [place] of Knowledge: Good  Language: Good   Psychomotor Activity  Psychomotor Activity:No data recorded  Assets  Assets: Communication  Skills; Desire for Improvement; Social Support   Sleep  Sleep: fair   Physical Exam Constitutional:      Appearance: the patient is not toxic-appearing.  Pulmonary:     Effort: Pulmonary effort is normal.  Neurological:     General: No focal deficit present.     Mental Status: the patient is alert and oriented to person, place, and time.   Review of Systems  Respiratory:  Negative for shortness of breath.   Cardiovascular:  Negative for chest pain.  Gastrointestinal:  Negative for abdominal pain, constipation, diarrhea, nausea and vomiting.  Neurological:  Negative for headaches.   Blood pressure 106/83, pulse (!) 101, temperature 97.7 F (36.5 C), temperature source Oral, resp. rate 17, height '5\' 8"'$  (1.727 m), weight 132.5 kg, SpO2 100 %. Body mass index is 44.4 kg/m.  Corky Sox, MD PGY-2

## 2022-09-28 NOTE — Progress Notes (Signed)
Foley catheter no. 16 placed after primary RN Dianna attempted to place no. 14 FR without success. Patient tolerated procedure well. Clear yellow urine 325 ml.

## 2022-09-28 NOTE — Plan of Care (Signed)
  Problem: Education: Goal: Ability to describe self-care measures that may prevent or decrease complications (Diabetes Survival Skills Education) will improve Outcome: Not Progressing Goal: Individualized Educational Video(s) Outcome: Not Progressing   Problem: Coping: Goal: Ability to adjust to condition or change in health will improve Outcome: Not Progressing   Problem: Fluid Volume: Goal: Ability to maintain a balanced intake and output will improve Outcome: Not Progressing   Problem: Health Behavior/Discharge Planning: Goal: Ability to identify and utilize available resources and services will improve Outcome: Not Progressing Goal: Ability to manage health-related needs will improve Outcome: Not Progressing   Problem: Metabolic: Goal: Ability to maintain appropriate glucose levels will improve Outcome: Not Progressing   Problem: Nutritional: Goal: Maintenance of adequate nutrition will improve Outcome: Not Progressing Goal: Progress toward achieving an optimal weight will improve Outcome: Not Progressing   Problem: Skin Integrity: Goal: Risk for impaired skin integrity will decrease Outcome: Not Progressing   Problem: Tissue Perfusion: Goal: Adequacy of tissue perfusion will improve Outcome: Not Progressing   Problem: Education: Goal: Knowledge of General Education information will improve Description: Including pain rating scale, medication(s)/side effects and non-pharmacologic comfort measures Outcome: Not Progressing   Problem: Health Behavior/Discharge Planning: Goal: Ability to manage health-related needs will improve Outcome: Not Progressing   Problem: Clinical Measurements: Goal: Ability to maintain clinical measurements within normal limits will improve Outcome: Not Progressing Goal: Will remain free from infection Outcome: Not Progressing Goal: Diagnostic test results will improve Outcome: Not Progressing Goal: Respiratory complications will  improve Outcome: Not Progressing Goal: Cardiovascular complication will be avoided Outcome: Not Progressing   Problem: Activity: Goal: Risk for activity intolerance will decrease Outcome: Not Progressing   Problem: Nutrition: Goal: Adequate nutrition will be maintained Outcome: Not Progressing   Problem: Coping: Goal: Level of anxiety will decrease Outcome: Not Progressing   Problem: Elimination: Goal: Will not experience complications related to bowel motility Outcome: Not Progressing Goal: Will not experience complications related to urinary retention Outcome: Not Progressing   Problem: Pain Managment: Goal: General experience of comfort will improve Outcome: Not Progressing   Problem: Safety: Goal: Ability to remain free from injury will improve Outcome: Not Progressing   Problem: Skin Integrity: Goal: Risk for impaired skin integrity will decrease Outcome: Not Progressing   Problem: Nutrition Goal: Patient maintains adequate hydration Outcome: Not Progressing Goal: Patient maintains weight Outcome: Not Progressing Goal: Patient/Family demonstrates understanding of diet Outcome: Not Progressing Goal: Patient/Family independently completes tube feeding Outcome: Not Progressing Goal: Patient will have no more than 5 lb weight change during LOS Outcome: Not Progressing Goal: Patient will utilize adaptive techniques to administer nutrition Outcome: Not Progressing Goal: Patient will verbalize dietary restrictions Outcome: Not Progressing

## 2022-09-28 NOTE — Progress Notes (Signed)
Pt. Provided with dentures, breakfast encouraged and pt. Refusing, pt. Has not voided yet, bladder volume per scan 151, MD aware  Anastasio Auerbach

## 2022-09-28 NOTE — Progress Notes (Addendum)
[facility] Progress Note   Assessment/ Plan:   AKI on CKD 4: BL possibly 2.5-3 but fluctuates. Previously was followed by Dr Joylene Grapes  - main issue was poor Po intake complicated with urinary retention   - stop IVFs with pleural effusions  - Foley in place now  - dont' expect this BUN to cause uremia- no indication for RRT  - dialysis would not be of utility in this setting- she appears profoundly depressed too but with some duodenal ulcers (which could be contributing to food aversion but not the whole picture) and if she's not eating dialysis essentially prolongs death by starvation.    - Zyprexa replaced remeron recently - incr hco3 to 2 BID  - appreciate palliative assistance  - only 30 mg protein on last UA, UP/C only 0.53  - stopped  the free water on 11/7 as Na is dropping and overloaded. Also nauseous despite Reglan which can cause hyponatremia; will also give zofran. With such a poor appetite + elevated LFT do not want to give Tolvaptan. NaCl 1gm BID as well.  - check post void as well now that foley is out; only 157m this am but pt did not attempt to void.  - started Lasix '80mg'$  q8hrs PO; likely CRS contributing (diastolic dysfunction G1) as well but certainly can be ATN as well. Regardless she is not a dialysis candidate and she was clear she did not want dialysis.   Depression:  - Zyprexa  - already tried remeron without much improvement  Abd pain: unknown etiology. NG tube in place. Extensive workup.  - endoscopy 11/1- duodenal ulcers, carafate reduced, on PPI   Elevated LFTs:  - per GI  - reorder TTE- EF 65-70% and G1DD    HTN: BP slightly high. Cont current meds  Subjective:    Seen in room, pleasant and able to answer some questions. Still no appetite.      Objective:   BP 106/83 (BP Location: Left Arm)   Pulse (!) 101   Temp 97.7 F (36.5 C) (Oral)   Resp 17   Ht '5\' 8"'$  (1.727 m)   Wt 132.5 kg   SpO2 100%   BMI 44.40 kg/m   Intake/Output  Summary (Last 24 hours) at 09/28/2022 1214 Last data filed at 09/28/2022 0900 Gross per 24 hour  Intake 640 ml  Output 1 ml  Net 639 ml   Weight change:   Physical Exam: GFMB:WGYKaffect, sitting in chair CVS:RRR Resp: clear AZLD:JTTSExt: 1+ LE edema  Imaging: VAS [place]PPER EXTREMITY VENOUS DUPLEX  Result Date: 09/27/2022 UPPER VENOUS STUDY  [name] Date of Exam:   09/27/2022 Medical Rec #: [id number]            Accession #:    [id number] of Birth: 2Aug 03, 1952            Patient Gender: F Patient Age:   89years [facility]      VAS [place]PPER EXTREMITY VENOUS DUPLEX Referring Phys: GRACE LAU --------------------------------------------------------------------------------  Indications: Swelling, and Pain Limitations: Patient's position. Comparison Study: No priors. Performing Technologist: ROda CoganRDMS, RVT  Examination Guidelines: A complete evaluation includes B-mode imaging, spectral Doppler, color Doppler, and power Doppler as needed of all accessible portions of each vessel. Bilateral testing is considered an integral part of a complete examination. Limited examinations for reoccurring indications may be performed as noted.  Right Findings: +----------+------------+---------+-----------+----------+--------------+ RIGHT  CompressiblePhasicitySpontaneousProperties   Summary     +----------+------------+---------+-----------+----------+--------------+ IJV           Full       Yes       Yes                             +----------+------------+---------+-----------+----------+--------------+ Subclavian    Full       Yes       Yes                             +----------+------------+---------+-----------+----------+--------------+ Axillary      Full       Yes       Yes                             +----------+------------+---------+-----------+----------+--------------+ Brachial      Full                                                  +----------+------------+---------+-----------+----------+--------------+ Radial        Full                                                 +----------+------------+---------+-----------+----------+--------------+ Ulnar         Full                                                 +----------+------------+---------+-----------+----------+--------------+ Cephalic                                            Not visualized +----------+------------+---------+-----------+----------+--------------+ Basilic       Full                                                 +----------+------------+---------+-----------+----------+--------------+  Left Findings: +----------+------------+---------+-----------+----------+-------+ LEFT      CompressiblePhasicitySpontaneousPropertiesSummary +----------+------------+---------+-----------+----------+-------+ Subclavian               Yes       Yes                      +----------+------------+---------+-----------+----------+-------+  Summary:  Right: No evidence of deep vein thrombosis in the upper extremity. Right cephalic vein was not clearly visualized.  Left: No evidence of thrombosis in the subclavian.  *See table(s) above for measurements and observations.  Diagnosing physician: Deitra Mayo MD Electronically signed by Deitra Mayo MD on 09/27/2022 at 6:11:22 PM.    Final    [place] EKG SITE RITE  Result Date: 09/26/2022 If Site Rite image not attached, placement could not be confirmed due to current cardiac rhythm.   Labs: BMET Recent Labs  Lab 09/22/22 1026 09/24/22 1400 09/24/22 1433 09/26/22 0403 09/27/22  0544 09/28/22 0725  NA 134* 131* 132* 131* 128* 127*  K 3.8 4.0 4.0 4.4 4.6 3.9  CL 100 99 100 100 97* 98  CO2 19* 17* 18* 16* 16* 15*  GLUCOSE 76 185* 187* 174* 121* 131*  BUN 66* 70* 69* 79* 88* 85*  CREATININE 3.73* 3.85* 3.99* 4.08* 4.24* 4.21*  CALCIUM 8.0*  7.6* 7.6* 7.8* 7.7* 7.7*  PHOS  --  4.8*  --   --   --   --    CBC Recent Labs  Lab 09/24/22 1433 09/26/22 0403 09/27/22 0544 09/28/22 0725  WBC 8.2 9.0 12.8* 11.0*  HGB 8.1* 8.3* 8.8* 8.0*  HCT 24.0* 24.6* 25.6* 24.0*  MCV 98.8 98.4 97.0 100.8*  PLT 345 333 288 282    Medications:     acetaminophen  650 mg Rectal Q6H   Or   acetaminophen (TYLENOL) oral liquid 160 mg/5 mL  1,000 mg Oral Q6H   amLODipine  10 mg Oral Daily   Chlorhexidine Gluconate Cloth  6 each Topical Daily   cyproheptadine  2 mg Per Tube QID   dicyclomine  10 mg Oral TID AC & HS   DULoxetine  30 mg Oral Q breakfast   enoxaparin (LOVENOX) injection  30 mg Subcutaneous Q24H   feeding supplement  237 mL Oral TID BM   feeding supplement (PROSource TF20)  60 mL Per Tube Daily   folic acid  1 mg Oral Daily   furosemide  80 mg Per Tube Q8H   insulin aspart  0-9 Units Subcutaneous Q4H   lipase/protease/amylase  24,000 Units Oral TID   metoCLOPramide  10 mg Oral TID AC   mirtazapine  7.5 mg Oral QHS   multivitamin  1 tablet Per Tube QHS   mouth rinse  15 mL Mouth Rinse 4 times per day   pantoprazole  40 mg Oral BID   rosuvastatin  10 mg Per Tube QPM   sodium bicarbonate  650 mg Oral BID   sucralfate  1 g Oral BID   thiamine  100 mg Per Tube Daily   vancomycin  125 mg Per Tube QID   09/28/2022, 12:14 PM

## 2022-09-28 NOTE — Progress Notes (Signed)
Pt. Still has not voided, bladder scan 316 ml, MD aware  Anastasio Auerbach

## 2022-09-29 DIAGNOSIS — I130 Hypertensive heart and chronic kidney disease with heart failure and stage 1 through stage 4 chronic kidney disease, or unspecified chronic kidney disease: Secondary | ICD-10-CM | POA: Diagnosis not present

## 2022-09-29 DIAGNOSIS — D649 Anemia, unspecified: Secondary | ICD-10-CM | POA: Diagnosis not present

## 2022-09-29 DIAGNOSIS — N390 Urinary tract infection, site not specified: Secondary | ICD-10-CM | POA: Diagnosis not present

## 2022-09-29 DIAGNOSIS — N184 Chronic kidney disease, stage 4 (severe): Secondary | ICD-10-CM | POA: Diagnosis not present

## 2022-09-29 DIAGNOSIS — I509 Heart failure, unspecified: Secondary | ICD-10-CM | POA: Diagnosis not present

## 2022-09-29 DIAGNOSIS — R531 Weakness: Secondary | ICD-10-CM | POA: Diagnosis not present

## 2022-09-29 DIAGNOSIS — G8929 Other chronic pain: Secondary | ICD-10-CM | POA: Diagnosis not present

## 2022-09-29 DIAGNOSIS — E1110 Type 2 diabetes mellitus with ketoacidosis without coma: Secondary | ICD-10-CM | POA: Diagnosis not present

## 2022-09-29 DIAGNOSIS — R1084 Generalized abdominal pain: Secondary | ICD-10-CM | POA: Diagnosis not present

## 2022-09-29 DIAGNOSIS — N179 Acute kidney failure, unspecified: Secondary | ICD-10-CM | POA: Diagnosis not present

## 2022-09-29 DIAGNOSIS — E876 Hypokalemia: Secondary | ICD-10-CM | POA: Diagnosis not present

## 2022-09-29 LAB — GLUCOSE, CAPILLARY
Glucose-Capillary: 132 mg/dL — ABNORMAL HIGH (ref 70–99)
Glucose-Capillary: 148 mg/dL — ABNORMAL HIGH (ref 70–99)
Glucose-Capillary: 166 mg/dL — ABNORMAL HIGH (ref 70–99)
Glucose-Capillary: 167 mg/dL — ABNORMAL HIGH (ref 70–99)
Glucose-Capillary: 171 mg/dL — ABNORMAL HIGH (ref 70–99)
Glucose-Capillary: 173 mg/dL — ABNORMAL HIGH (ref 70–99)
Glucose-Capillary: 196 mg/dL — ABNORMAL HIGH (ref 70–99)

## 2022-09-29 LAB — CBC
HCT: 23.3 % — ABNORMAL LOW (ref 36.0–46.0)
Hemoglobin: 8.3 g/dL — ABNORMAL LOW (ref 12.0–15.0)
MCH: 35.2 pg — ABNORMAL HIGH (ref 26.0–34.0)
MCHC: 35.6 g/dL (ref 30.0–36.0)
MCV: 98.7 fL (ref 80.0–100.0)
Platelets: 289 10*3/uL (ref 150–400)
RBC: 2.36 MIL/uL — ABNORMAL LOW (ref 3.87–5.11)
RDW: 22 % — ABNORMAL HIGH (ref 11.5–15.5)
WBC: 10.1 10*3/uL (ref 4.0–10.5)
nRBC: 0 % (ref 0.0–0.2)

## 2022-09-29 LAB — COMPREHENSIVE METABOLIC PANEL
ALT: 47 U/L — ABNORMAL HIGH (ref 0–44)
AST: 34 U/L (ref 15–41)
Albumin: <1.5 g/dL — ABNORMAL LOW (ref 3.5–5.0)
Alkaline Phosphatase: 348 U/L — ABNORMAL HIGH (ref 38–126)
Anion gap: 15 (ref 5–15)
BUN: 87 mg/dL — ABNORMAL HIGH (ref 8–23)
CO2: 16 mmol/L — ABNORMAL LOW (ref 22–32)
Calcium: 7.8 mg/dL — ABNORMAL LOW (ref 8.9–10.3)
Chloride: 97 mmol/L — ABNORMAL LOW (ref 98–111)
Creatinine, Ser: 4.38 mg/dL — ABNORMAL HIGH (ref 0.44–1.00)
GFR, Estimated: 10 mL/min — ABNORMAL LOW (ref >60)
Glucose, Bld: 170 mg/dL — ABNORMAL HIGH (ref 70–99)
Potassium: 4 mmol/L (ref 3.5–5.1)
Sodium: 128 mmol/L — ABNORMAL LOW (ref 135–145)
Total Bilirubin: 0.5 mg/dL (ref 0.3–1.2)
Total Protein: 4.5 g/dL — ABNORMAL LOW (ref 6.5–8.1)

## 2022-09-29 MED ORDER — OSMOLITE 1.5 CAL PO LIQD
1000.0000 mL | ORAL | Status: DC
  Administered 2022-09-29 – 2022-10-01 (×3): 1000 mL
  Filled 2022-09-29 (×3): qty 1000

## 2022-09-29 MED ORDER — METOCLOPRAMIDE HCL 5 MG/5ML PO SOLN
10.0000 mg | Freq: Three times a day (TID) | ORAL | Status: DC
  Administered 2022-09-30 – 2022-10-01 (×4): 10 mg
  Filled 2022-09-29 (×6): qty 10

## 2022-09-29 MED ORDER — ACETAMINOPHEN 650 MG RE SUPP: 650.0000 mg | Freq: Four times a day (QID) | RECTAL | Status: DC

## 2022-09-29 MED ORDER — PROCHLORPERAZINE MALEATE 10 MG PO TABS: 10.0000 mg | ORAL_TABLET | Freq: Four times a day (QID) | ORAL | Status: DC | PRN

## 2022-09-29 MED ORDER — FAMOTIDINE 20 MG PO TABS
20.0000 mg | ORAL_TABLET | Freq: Every day | ORAL | Status: DC
  Administered 2022-09-29 – 2022-09-30 (×2): 20 mg
  Filled 2022-09-29: qty 1

## 2022-09-29 MED ORDER — FOLIC ACID 1 MG PO TABS
1.0000 mg | ORAL_TABLET | Freq: Every day | ORAL | Status: DC
  Administered 2022-09-30 – 2022-10-01 (×2): 1 mg
  Filled 2022-09-29 (×2): qty 1

## 2022-09-29 MED ORDER — SODIUM BICARBONATE 650 MG PO TABS
1300.0000 mg | ORAL_TABLET | Freq: Two times a day (BID) | ORAL | Status: DC
  Administered 2022-09-29 – 2022-10-01 (×4): 1300 mg
  Filled 2022-09-29 (×3): qty 2

## 2022-09-29 MED ORDER — ENSURE ENLIVE PO LIQD: 237.0000 mL | Freq: Three times a day (TID) | ORAL | Status: DC

## 2022-09-29 MED ORDER — AMLODIPINE BESYLATE 10 MG PO TABS
10.0000 mg | ORAL_TABLET | Freq: Every day | ORAL | Status: DC
  Administered 2022-09-30 – 2022-10-01 (×2): 10 mg
  Filled 2022-09-29 (×2): qty 1

## 2022-09-29 MED ORDER — DICYCLOMINE HCL 10 MG PO CAPS
10.0000 mg | ORAL_CAPSULE | Freq: Three times a day (TID) | ORAL | Status: DC
  Administered 2022-09-29 – 2022-10-01 (×7): 10 mg
  Filled 2022-09-29 (×6): qty 1

## 2022-09-29 MED ORDER — SODIUM CHLORIDE 1 G PO TABS
1.0000 g | ORAL_TABLET | Freq: Two times a day (BID) | ORAL | Status: DC
  Administered 2022-09-30 – 2022-10-01 (×3): 1 g
  Filled 2022-09-29 (×3): qty 1

## 2022-09-29 MED ORDER — FAMOTIDINE 20 MG PO TABS: 40.0000 mg | ORAL_TABLET | Freq: Two times a day (BID) | ORAL | Status: DC

## 2022-09-29 MED ORDER — MIRTAZAPINE 15 MG PO TBDP
15.0000 mg | ORAL_TABLET | Freq: Every day | ORAL | Status: DC
  Administered 2022-09-29 – 2022-09-30 (×2): 15 mg
  Filled 2022-09-29 (×3): qty 1

## 2022-09-29 MED ORDER — ACETAMINOPHEN 160 MG/5ML PO SOLN
1000.0000 mg | Freq: Four times a day (QID) | ORAL | Status: DC
  Administered 2022-09-30 – 2022-10-01 (×7): 1000 mg
  Filled 2022-09-29 (×7): qty 40.6

## 2022-09-29 MED ORDER — OSMOLITE 1.5 CAL PO LIQD
1000.0000 mL | ORAL | Status: DC
  Filled 2022-09-29: qty 1000

## 2022-09-29 NOTE — TOC Progression Note (Signed)
Transition of Care [facility]) - Initial/Assessment Note    Patient Details  Name: Alicia [name] MRN: [id number] Date of Birth: 1951/06/21  Transition of Care [facility]) CM/SW Contact:    Milinda Antis, LCSWA Phone Number: 09/29/2022, 4:37 PM  Clinical Narrative:                 Transition of Care Department [facility]) has reviewed patient.  The patient continues to have a [place] which is a barrier to SNF placement.    We will continue to monitor patient advancement through interdisciplinary progression rounds.  Expected Discharge Plan: Skilled Nursing Facility Barriers to Discharge: Insurance Authorization, SNF Pending bed offer, Continued Medical Work up   Patient Goals and CMS Choice Patient states their goals for this hospitalization and ongoing recovery are:: Pt wants to be able to return home. CMS Medicare.gov Compare Post Acute Care list provided to:: Patient Choice offered to / list presented to : Patient  Expected Discharge Plan and Services Expected Discharge Plan: [place] Acute Care Choice: [place] arrangements for the past 2 months: Single Family Home                                      Prior Living Arrangements/Services Living arrangements for the past 2 months: Single Family Home Lives with:: Self Patient language and need for interpreter reviewed:: Yes Do you feel safe going back to the place where you live?: Yes      Need for Family Participation in Patient Care: Yes (Comment) Care giver support system in place?: No (comment)   Criminal Activity/Legal Involvement Pertinent to Current Situation/Hospitalization: No - Comment as needed  Activities of Daily Living Home Assistive Devices/Equipment: Walker (specify type) ADL Screening (condition at time of admission) Patient's cognitive ability adequate to safely complete daily activities?: Yes Is the patient deaf or have difficulty hearing?: No Does the  patient have difficulty seeing, even when wearing glasses/contacts?: No Does the patient have difficulty concentrating, remembering, or making decisions?: No Patient able to express need for assistance with ADLs?: Yes Does the patient have difficulty dressing or bathing?: Yes Independently performs ADLs?: No Communication: Independent Dressing (OT): Needs assistance Is this a change from baseline?: Pre-admission baseline Grooming: Needs assistance Is this a change from baseline?: Pre-admission baseline Feeding: Independent Bathing: Needs assistance Is this a change from baseline?: Pre-admission baseline Toileting: Independent with device (comment) In/Out Bed: Independent with device (comment) Walks in Home: Independent with device (comment) Does the patient have difficulty walking or climbing stairs?: Yes Weakness of Legs: Both Weakness of Arms/Hands: None  Permission Sought/Granted Permission sought to share information with : Family Supports Permission granted to share information with : No              Emotional Assessment Appearance:: Appears stated age Attitude/Demeanor/Rapport: Guarded Affect (typically observed): Flat Orientation: : Oriented to Self, Oriented to Place, Oriented to  Time, Oriented to Situation Alcohol / Substance Use: Not Applicable Psych Involvement: No (comment)  Admission diagnosis:  Generalized weakness [R53.1] Patient Active Problem List   Diagnosis Date Noted   Food aversion 09/09/2022   Hypoalbuminemia due to protein-calorie malnutrition ([place]) 09/09/2022   Duodenal ulcer 09/01/2022   Mucosal abnormality of stomach 09/01/2022   Mucosal abnormality of esophagus 09/01/2022   Chronic generalized abdominal pain 08/30/2022   Nausea and vomiting    Diarrhea  Pressure injury of skin 08/27/2022   Generalized weakness 08/24/2022   Acute exacerbation of congestive heart failure ([place]) 06/30/2022   Hypernatremia 06/30/2022   Hyperkalemia  06/17/2022   Normocytic anemia 06/17/2022   Intertrigo    Metabolic acidosis    Cellulitis 05/20/2022   Vitamin D deficiency 05/20/2022   Headache 02/16/2022   Disequilibrium 12/01/2021   (HFpEF) heart failure with preserved ejection fraction ([place]) 07/18/2021   Exacerbation of RAD (reactive airway disease) 02/27/2020   Acute worsening of stage 4 chronic kidney disease ([place]) 02/27/2020   Tobacco use 02/27/2020   Prolonged Q-T interval on ECG 04/27/2018   Diabetes mellitus type 2 in obese ([place]) 03/16/2018   Severe obesity (BMI >= 40) (HCC)    CVA (cerebral vascular accident) ([place]) 03/15/2018   Peripheral vascular disease, unspecified ([place]) 11/30/2012   Hyperlipidemia 07/20/2007   Primary hypertension 07/20/2007   PCP:  Rick Duff, MD Pharmacy:   [facility] ([facility] [place] [address] [place] ([place]) [place] [address] Phone: [phone] Fax: [phone]     Social Determinants of Health (SDOH) Interventions    Readmission Risk Interventions    03/02/2020    1:08 PM  Readmission Risk Prevention Plan  Transportation Screening Complete  PCP or Specialist Appt within 5-7 Days Complete  Home Care Screening Complete  Medication Review (RN CM) Complete

## 2022-09-29 NOTE — Progress Notes (Addendum)
Subjective:   Summary: Alicia [name] is a 71 y.o. year old female currently admitted on the IMTS HD#34 for diffuse abdominal pain as well as severely decreased PO intake likely multifactorial in etiology due to pancreatic insufficiency, chronic IMA occlusion, duodenal ulcers.  Overnight Events:  -NAEON.  Abdominal pain well controlled still, no nausea or vomiting, diarrhea is stable.  Still baseline of no p.o. intake - [name] is more disoriented and confused today.  Not able to accurately recall the specific details of our extensive discussion yesterday regarding her poor prognosis and futility of any further aggressive interventions given poor p.o. intake and severe sequelae of malnutrition.  Her decision-making capacity seems to wax and wane and is insufficient today especially for making goals of care decisions.    Objective:  Vital signs in last 24 hours: Vitals:   09/28/22 1655 09/28/22 2012 09/29/22 0416 09/29/22 0500  BP: 120/75 132/81 (!) 130/49   Pulse: 84 83 88   Resp: '16 16 18   '$ Temp: 97.6 F (36.4 C) (!) 97.3 F (36.3 C) 98 F (36.7 C)   TempSrc: Oral Oral Oral   SpO2: 100% 100% 100%   Weight:    132.5 kg  Height:       Supplemental O2: Room Air    Physical Exam:  Constitutional: NAD HEENT: Coretrack in place Cardiovascular: RRR, no murmurs rubs or gallops Pulmonary: Normal respiratory effort on room air Abdominal: minimal diffuse TTP, no guarding or rebound.  Distended but soft.  Periumbilical hernia unchanged.  Extremities: 2+ pitting edema of lower extremities bilaterally.  Upper extremities grossly edematous.   Neuro/Psych: Affect very flat today, disoriented and minimally interactive/verbal  Intake/Output Summary (Last 24 hours) at 09/29/2022 0720 Last data filed at 09/29/2022 0601 Gross per 24 hour  Intake 1427.83 ml  Output 450 ml  Net 977.83 ml    Net IO Since Admission: 31,421.33 mL [09/29/22 0720]   Pertinent  Labs:    Latest Ref Rng & Units 09/29/2022    6:28 AM 09/28/2022    7:25 AM 09/27/2022    5:44 AM  CBC  WBC 4.0 - 10.5 K/uL 10.1  11.0  12.8   Hemoglobin 12.0 - 15.0 g/dL 8.3  8.0  8.8   Hematocrit 36.0 - 46.0 % 23.3  24.0  25.6   Platelets 150 - 400 K/uL 289  282  288        Latest Ref Rng & Units 09/29/2022    6:28 AM 09/28/2022    7:25 AM 09/27/2022    5:44 AM  CMP  Glucose 70 - 99 mg/dL 170  131  121   BUN 8 - 23 mg/dL 87  85  88   Creatinine 0.44 - 1.00 mg/dL 4.38  4.21  4.24   Sodium 135 - 145 mmol/L 128  127  128   Potassium 3.5 - 5.1 mmol/L 4.0  3.9  4.6   Chloride 98 - 111 mmol/L 97  98  97   CO2 22 - 32 mmol/L '16  15  16   '$ Calcium 8.9 - 10.3 mg/dL 7.8  7.7  7.7   Total Protein 6.5 - 8.1 g/dL 4.5  4.4  4.4   Total Bilirubin 0.3 - 1.2 mg/dL 0.5  0.6  0.5   Alkaline Phos 38 - 126 U/L 348  366  404   AST 15 - 41 U/L 34  30  41   ALT 0 - 44 U/L 47  44  50   Last 3 CBGs 173, 148, 171 Urine protein to creatinine ratio 0.53, total urine protein 31 Ferritin 339, reticulocyte count percent 1.7, absolute 117    Assessment/Plan:   Patient Summary: Alicia [name] is a 71 y.o. with a pertinent PMH of HFpEF, HTN, DMII, CKD4, PAD, prior CVA, who presented with progressive weakness and post-prandial abdominal pain and admitted for decreased functional ability, diffuse abdominal pain, and chronic diarrhea likely multifactorial in etiology due to pancreatic insufficiency, duodenal ulcers.  We will continue to discuss poor prognosis and consider options during family meeting this week.  #Abdominal Pain of Unkown Etiology #Chronic diarrhea #IMA occlusion #Duodenal ulcers #Pancreatic insufficiency #Lack of PO Intake  #Severe food aversion #C. Diff infection Continuing previously described medical management at this point but however, overall, her prognosis is very poor and we have exhausted all available treatment options.  Her decision-making capacity seems to wax and wane and  unfortunately today she is not able to recount the details of our prior conversation regarding her prognosis.  We will have goals of care discussion with family tomorrow, but believe ethics committee can help guide decision-making as well.  Appreciate ethics committee/palliative team recs. Plan:  -Discuss goals of care during family meeting, appreciate palliative assistance.  Appreciate ethics committee recommendations regarding goals of care discussions as well. - Psych reconsulted, appreciate recs -Increase PO intake--will let her eat whatever appeals to her -CoreTrak with feeds at 50/hr -GI consulted, appreciate their recommendations -PO Protonix BID -Carafate at decreased dose for 1 additional day -Bentyl TID with meals -Vancomycin PO for c. diff -PT/OT -Continue Imodium -Crestor 10 mg daily -Reglan 10 mg TID with meals -Remeron 15 mg nightly -Peractin 2 mg 4 times daily until 11/08, then increase to 4 mg 4 times daily -Creon 24,000 units TID PO (cannot be administered via tube)  #AKI on CKD4 #Hyponatremia #Hyperkalemia BUN 87, Cr 4.38, UPC ratio elevated.  Hyponatremic at 128.   Overall, kidney function worsening and she is not a dialysis candidate.  Nephrology consulted for assistance, appreciate [facility] for assistance.  Holding free water, supplementing sodium for hyponatremia, starting PO Lasix 80 mg every 8 hours.  Patient is not a dialysis candidate - Avoid IV fluids. Need to replace IV access, but would hold off on central access. - Avoid nephrotoxic medications, will consider DC Cymbalta if GFR worsens - Trend CMP  #Right upper extremity pain RUE Doppler negative  #HFpEF Repeat echo shows EF 65 to 70% and grade 1 diastolic dysfunction as well as aortic sclerosis with mild regurgitation.  Remains significantly volume overloaded, will reassess with nephrology as above. - Restrict IV fluids - Appreciate nephrology recs  #Normocytic anemia Hgb  improving at 8.0. RI hypoproloferative at 1.91.  TIBC was hemolyzed this morning.  Overall, many etiologies and overall no treatment indicated at this point. -Trend CBC -f\u TIBC  Code: Full Dispo: pending  Linus Galas MD PGY-1 Internal Medicine Resident Pager: [phone] Please contact the on call pager after 5 pm and on weekends at [phone].

## 2022-09-29 NOTE — Progress Notes (Signed)
[facility] Progress Note   Assessment/ Plan:   AKI on CKD 4: BL possibly 2.5-3 but fluctuates. Previously was followed by Dr Joylene Grapes  - main issue was poor Po intake complicated with urinary retention   - stop IVFs with pleural effusions  - Foley in place now  - dont' expect this BUN to cause uremia- no indication for RRT  - dialysis would not be of utility in this setting- she appears profoundly depressed too but with some duodenal ulcers (which could be contributing to food aversion but not the whole picture) and if she's not eating dialysis essentially prolongs death by starvation.    - Back on Remeron  - incr hco3 to 2 BID  - appreciate palliative assistance  - only 30 mg protein on last UA, UP/C only 0.53  - stopped  the free water on 11/7 as Na is dropping and overloaded. Also nauseous despite Reglan which can cause hyponatremia; will also give  Compazine -> somewhat better. With such a poor appetite + elevated LFT do not want to give Tolvaptan. NaCl 1gm BID as well.  - check post void as well now that foley is out; only 165m 11/8 am but pt did not attempt to void.  - started Lasix '80mg'$  q8hrs PO; likely CRS contributing (diastolic dysfunction G1) as well but certainly can be ATN as well.   Unfortunately she is NOT a dialysis candidate; if she is not eating PO, bedbound, multiple comorbidities including severe depression RRT is not going to alter her prognosis or improve her QOL.  Signing off at this time; please reconsult as needed.   Depression:  - Zyprexa  - already tried remeron without much improvement  Abd pain: unknown etiology. NG tube in place. Extensive workup.  - endoscopy 11/1- duodenal ulcers, carafate reduced, on PPI   Elevated LFTs:  - per GI  - reorder TTE- EF 65-70% and G1DD    HTN: BP slightly high. Cont current meds  Subjective:    Seen in room, pleasant and able to answer some questions. Still no appetite. I explained that dialysis is  not going to improve her QOL especially if she's not eating and is bedbound. She expressed understanding     Objective:   BP (!) 146/73   Pulse 88   Temp 98.3 F (36.8 C) (Oral)   Resp 18   Ht '5\' 8"'$  (1.727 m)   Wt 132.5 kg   SpO2 100%   BMI 44.40 kg/m   Intake/Output Summary (Last 24 hours) at 09/29/2022 1028 Last data filed at 09/29/2022 0900 Gross per 24 hour  Intake 1367.83 ml  Output 650 ml  Net 717.83 ml   Weight change: 0 kg  Physical Exam: GWVP:XTGGaffect, supine  in bed  CVS:RRR Resp: clear AYIR:SWNIExt: 1+ LE edema  Imaging: VAS [place]PPER EXTREMITY VENOUS DUPLEX  Result Date: 09/27/2022 UPPER VENOUS STUDY  Patient Name:  JLYNDZIE ZENTZ Date of Exam:   09/27/2022 Medical Rec #: [id number]            Accession #:    [id number] of Birth: [birth year]            Patient Gender: F Patient Age:   89years [facility]      VAS [place]PPER EXTREMITY VENOUS DUPLEX Referring Phys: GRACE LAU --------------------------------------------------------------------------------  Indications: Swelling, and Pain Limitations: Patient's position. Comparison Study: No priors. Performing Technologist: ROda CoganRDMS, RVT  Examination Guidelines: A complete evaluation includes B-mode imaging, spectral Doppler, color Doppler, and power Doppler as needed of all accessible portions of each vessel. Bilateral testing is considered an integral part of a complete examination. Limited examinations for reoccurring indications may be performed as noted.  Right Findings: +----------+------------+---------+-----------+----------+--------------+ RIGHT     CompressiblePhasicitySpontaneousProperties   Summary     +----------+------------+---------+-----------+----------+--------------+ IJV           Full       Yes       Yes                             +----------+------------+---------+-----------+----------+--------------+ Subclavian    Full       Yes        Yes                             +----------+------------+---------+-----------+----------+--------------+ Axillary      Full       Yes       Yes                             +----------+------------+---------+-----------+----------+--------------+ Brachial      Full                                                 +----------+------------+---------+-----------+----------+--------------+ Radial        Full                                                 +----------+------------+---------+-----------+----------+--------------+ Ulnar         Full                                                 +----------+------------+---------+-----------+----------+--------------+ Cephalic                                            Not visualized +----------+------------+---------+-----------+----------+--------------+ Basilic       Full                                                 +----------+------------+---------+-----------+----------+--------------+  Left Findings: +----------+------------+---------+-----------+----------+-------+ LEFT      CompressiblePhasicitySpontaneousPropertiesSummary +----------+------------+---------+-----------+----------+-------+ Subclavian               Yes       Yes                      +----------+------------+---------+-----------+----------+-------+  Summary:  Right: No evidence of deep vein thrombosis in the upper extremity. Right cephalic vein was not clearly visualized.  Left: No evidence of thrombosis in the subclavian.  *See table(s) above for measurements and observations.  Diagnosing physician: Deitra Mayo MD Electronically signed by Deitra Mayo  MD on 09/27/2022 at 6:11:22 PM.    Final     Labs: BMET Recent Labs  Lab 09/24/22 1400 09/24/22 1433 09/26/22 0403 09/27/22 0544 09/28/22 0725 09/29/22 0628  NA 131* 132* 131* 128* 127* 128*  K 4.0 4.0 4.4 4.6 3.9 4.0  CL 99 100 100 97* 98 97*  CO2 17* 18*  16* 16* 15* 16*  GLUCOSE 185* 187* 174* 121* 131* 170*  BUN 70* 69* 79* 88* 85* 87*  CREATININE 3.85* 3.99* 4.08* 4.24* 4.21* 4.38*  CALCIUM 7.6* 7.6* 7.8* 7.7* 7.7* 7.8*  PHOS 4.8*  --   --   --   --   --    CBC Recent Labs  Lab 09/26/22 0403 09/27/22 0544 09/28/22 0725 09/29/22 0628  WBC 9.0 12.8* 11.0* 10.1  HGB 8.3* 8.8* 8.0* 8.3*  HCT 24.6* 25.6* 24.0* 23.3*  MCV 98.4 97.0 100.8* 98.7  PLT 333 288 282 289    Medications:     acetaminophen  650 mg Rectal Q6H   Or   acetaminophen (TYLENOL) oral liquid 160 mg/5 mL  1,000 mg Oral Q6H   amLODipine  10 mg Oral Daily   Chlorhexidine Gluconate Cloth  6 each Topical Daily   cyproheptadine  2 mg Per Tube QID   dicyclomine  10 mg Oral TID AC & HS   enoxaparin (LOVENOX) injection  30 mg Subcutaneous Q24H   feeding supplement  237 mL Oral TID BM   feeding supplement (PROSource TF20)  60 mL Per Tube Daily   folic acid  1 mg Oral Daily   furosemide  80 mg Per Tube Q8H   insulin aspart  0-9 Units Subcutaneous Q4H   lipase/protease/amylase  24,000 Units Oral TID   metoCLOPramide  10 mg Oral TID AC   mirtazapine  15 mg Oral QHS   multivitamin  1 tablet Per Tube QHS   mouth rinse  15 mL Mouth Rinse 4 times per day   pantoprazole  40 mg Oral BID   rosuvastatin  10 mg Per Tube QPM   sodium bicarbonate  1,300 mg Oral BID   sodium chloride  1 g Oral BID WC   thiamine  100 mg Per Tube Daily   vancomycin  125 mg Per Tube QID   09/29/2022, 10:28 AM

## 2022-09-29 NOTE — Progress Notes (Signed)
Patient ID: Alicia [name], female   DOB: 30-Sep-1951, 71 y.o.   MRN: [id number]      Progress Note from the Palliative Medicine Team at [facility]   Patient Name: Alicia [name]        Date: 09/29/2022 DOB: 1951/05/11  Age: 71 y.o. MRN#: [id number] Attending Physician: Charise Killian, MD Primary Care Physician: Rick Duff, MD Admit Date: 08/24/2022   Medical records reviewed   71 y.o. female  admitted on 08/24/2022 with  complex past medical history;  H.pylori gastritis (treated in 2020, follow up eradication testing negative), chronic abdominal pain,  chronic nausea,  colon polyps, pancreatic insufficiency, chronic IMA occlusion, duodenal ulcers, obesity    Admitted with weakness, post-prandial abdominal pain, N/V, loose stools.  AKI on CKD 4, refusing to eat/psych consulted, depression.  Patient is failing to thrive.  Patient/family  faces treatment option decisions, advanced directive decisions and anticipatory care needs.    Core track in place for nutritional support and tolerated at this time.  Now for consideration for G-Tube placement.  Patient is failing to thrive within the context of full medical support.   Today  her speech is garbled and she does not have medical decision making capacity.  I offered mouth care and she took a sip of water for me.  I placed a call to her daughter and left a message, await callback.  I am hoping she will be coming to town to visit this week-end, it is important that she participate in pending decisions for [name],  I worry her prognosis is limited.  Questions and concerns addressed      Total time spent on the unit was 25  minutes  PMT will continue to support holistically  Wadie Lessen NP  Palliative Medicine Team Team Phone # [phone] Pager [phone]

## 2022-09-29 NOTE — Progress Notes (Signed)
Occupational Therapy Treatment Patient Details Name: Alicia [name] MRN: [id number] DOB: 09-23-1951 Today's Date: 09/29/2022   History of present illness 71 y.o. female presented to ED 10/4 with FTT. Cortrak placed 10/18 PMhx: CHf, HTN, CVA, T2DM, CKD, vit D deficiency, PAD, depression, HFpEF   OT comments  Patient received in supine and agreeable to work with OT. Patient was discovered to have soiled bed and vomited, was assisted with cleaning. Patient rolled to right with mod assist and total assist for cleaning. Patient attempted to roll to left but required max assist and nursing tech assisted with rolling to address cleaning due to patient unable to maintain side lying position.  Patient continues to have BUE shoulder pain and edema at RUE. Acute OT to reassess ability to continue with acute OT.  Patient has not been able to progress with OT goals given medical condition.  Goals reviewed, and no changes made.     Recommendations for follow up therapy are one component of a multi-disciplinary discharge planning process, led by the attending physician.  Recommendations may be updated based on patient status, additional functional criteria and insurance authorization.    Follow Up Recommendations  Skilled nursing-short term rehab (<3 hours/day)    Assistance Recommended at Discharge Frequent or constant Supervision/Assistance  Patient can return home with the following  Two people to help with walking and/or transfers;Help with stairs or ramp for entrance;Direct supervision/assist for medications management;Assist for transportation;Assistance with feeding;A lot of help with bathing/dressing/bathroom   Equipment Recommendations  Wheelchair (measurements OT);Wheelchair cushion (measurements OT);BSC/3in1;Tub/shower seat;Hospital bed    Recommendations for Other Services      Precautions / Restrictions Precautions Precautions: Fall Precaution Comments: Skin integrity, incontinent of  bowel Restrictions Weight Bearing Restrictions: No       Mobility Bed Mobility Overal bed mobility: Needs Assistance Bed Mobility: Rolling Rolling: Mod assist, Max assist              Transfers                         Balance                                           ADL either performed or assessed with clinical judgement   ADL Overall ADL's : Needs assistance/impaired     Grooming: Wash/dry hands;Wash/dry face;Bed level;Moderate assistance                       Toileting- Clothing Manipulation and Hygiene: Total assistance;Bed level              Extremity/Trunk Assessment Upper Extremity Assessment RUE Deficits / Details: chronic R shoulder dysfunction, difficulty with anything above 90 degrees.  Unable to touch her nose, edema LUE Deficits / Details: Generalized weakness throughout.  Able to touch her nose       Cervical / Trunk Assessment Cervical / Trunk Assessment: Other exceptions Cervical / Trunk Exceptions: body habitus    Vision Patient Visual Report: No change from baseline     Perception Perception Perception: Within Functional Limits   Praxis Praxis Praxis: Intact    Cognition Arousal/Alertness: Awake/alert Behavior During Therapy: WFL for tasks assessed/performed Overall Cognitive Status: Within Functional Limits for tasks assessed  Exercises      Shoulder Instructions       General Comments      Pertinent Vitals/ Pain          Home Living                                          Prior Functioning/Environment              Frequency  Min 2X/week        Progress Toward Goals  OT Goals(current goals can now be found in the care plan section)  Progress towards OT goals: Not progressing toward goals - comment  Acute Rehab OT Goals Patient Stated Goal: none stated OT Goal Formulation: With  patient Time For Goal Achievement: 10/12/22 Potential to Achieve Goals: [place] Discharge plan remains appropriate    Co-evaluation                 AM-PAC OT "6 Clicks" Daily Activity     Outcome Measure   Help from another person eating meals?: A Lot Help from another person taking care of personal grooming?: A Lot Help from another person toileting, which includes using toliet, bedpan, or urinal?: Total Help from another person bathing (including washing, rinsing, drying)?: A Lot Help from another person to put on and taking off regular upper body clothing?: A Lot Help from another person to put on and taking off regular lower body clothing?: Total 6 Click Score: 10    End of Session Equipment Utilized During Treatment: Gait belt  OT Visit Diagnosis: Unsteadiness on feet (R26.81);Muscle weakness (generalized) (M62.81);History of falling (Z91.81)   Activity Tolerance Patient tolerated treatment well   Patient Left in bed;with call bell/phone within reach   Nurse Communication Mobility status        Time: 3149-7026 OT Time Calculation (min): 17 min  Charges: OT General Charges $OT Visit: 1 Visit OT Treatments $Self Care/Home Management : 8-22 mins  09/29/2022  RP, OTR/L  Acute Rehabilitation Services  Office:  [phone]   Metta Clines 09/29/2022, 9:46 AM

## 2022-09-30 DIAGNOSIS — G8929 Other chronic pain: Secondary | ICD-10-CM | POA: Diagnosis not present

## 2022-09-30 DIAGNOSIS — R1084 Generalized abdominal pain: Secondary | ICD-10-CM | POA: Diagnosis not present

## 2022-09-30 LAB — COMPREHENSIVE METABOLIC PANEL
ALT: 43 U/L (ref 0–44)
AST: 34 U/L (ref 15–41)
Albumin: <1.5 g/dL — ABNORMAL LOW (ref 3.5–5.0)
Alkaline Phosphatase: 369 U/L — ABNORMAL HIGH (ref 38–126)
Anion gap: 15 (ref 5–15)
BUN: 91 mg/dL — ABNORMAL HIGH (ref 8–23)
CO2: 16 mmol/L — ABNORMAL LOW (ref 22–32)
Calcium: 7.7 mg/dL — ABNORMAL LOW (ref 8.9–10.3)
Chloride: 95 mmol/L — ABNORMAL LOW (ref 98–111)
Creatinine, Ser: 4.37 mg/dL — ABNORMAL HIGH (ref 0.44–1.00)
GFR, Estimated: 10 mL/min — ABNORMAL LOW (ref >60)
Glucose, Bld: 192 mg/dL — ABNORMAL HIGH (ref 70–99)
Potassium: 4.5 mmol/L (ref 3.5–5.1)
Sodium: 126 mmol/L — ABNORMAL LOW (ref 135–145)
Total Bilirubin: 0.5 mg/dL (ref 0.3–1.2)
Total Protein: 4.4 g/dL — ABNORMAL LOW (ref 6.5–8.1)

## 2022-09-30 LAB — GLUCOSE, CAPILLARY
Glucose-Capillary: 148 mg/dL — ABNORMAL HIGH (ref 70–99)
Glucose-Capillary: 154 mg/dL — ABNORMAL HIGH (ref 70–99)
Glucose-Capillary: 155 mg/dL — ABNORMAL HIGH (ref 70–99)
Glucose-Capillary: 180 mg/dL — ABNORMAL HIGH (ref 70–99)
Glucose-Capillary: 192 mg/dL — ABNORMAL HIGH (ref 70–99)
Glucose-Capillary: 196 mg/dL — ABNORMAL HIGH (ref 70–99)

## 2022-09-30 LAB — CBC
HCT: 23.1 % — ABNORMAL LOW (ref 36.0–46.0)
Hemoglobin: 7.8 g/dL — ABNORMAL LOW (ref 12.0–15.0)
MCH: 33.6 pg (ref 26.0–34.0)
MCHC: 33.8 g/dL (ref 30.0–36.0)
MCV: 99.6 fL (ref 80.0–100.0)
Platelets: 296 10*3/uL (ref 150–400)
RBC: 2.32 MIL/uL — ABNORMAL LOW (ref 3.87–5.11)
RDW: 21.5 % — ABNORMAL HIGH (ref 11.5–15.5)
WBC: 9.8 10*3/uL (ref 4.0–10.5)
nRBC: 0.2 % (ref 0.0–0.2)

## 2022-09-30 NOTE — Consult Note (Signed)
[facility] Followup Face-to-Face Psychiatric Evaluation   Service Date: September 30, 2022 LOS:  LOS: 31 days    Assessment  The patient was last seen by the psychiatry service on 11/1. Briefly, pt with long hospitalization for failure to thrive with worsening symptoms of a motivation and confusion over the past few days. She has been seen by psychiatry thus far on 10/21, 10/23, 10/31 and 11/1; she has endorsed no to moderate symptoms of depression on serial evals. Had been endorsing worsening symptoms of anhedonia and amotivation to primary team over the last week.    We were reconsulted by Dr. Jodell Cipro on 11/7 for: "Patient appears significantly more depressed today. Please reassess and see if appropriate for any other management (discussed ketamine, ECT, etc. During rounds). Additionally, unsure if cymbalta is helping much and is risky given her worsening GFR. If she is evaluated as primary MDD, would want different regimen. She appears more somnolent, confused this morning, please formally assess decision making capacity"  Seen on 11/7: patient lethargic, barely able to follow simple commands, unable to converse.   Seen on 11/8: awake, alert, able to converse about abstract concepts (e.g. able to state the reason for placement of a feeding tube).  Nephrology states in their note today her BUN in the 80's is unlikely to be causing altered mentation. As noted above, she has reported mild depression or none at all to the psychiatry team. She reports having no appetite and therefore is not interested in eating. Talked about her case again with [facility] consult liaison and ECT psychiatrist Dr. Weber Cooks. In the context of waxing and waning mentation he feels the patient is not appropriate for ECT. Could consider this if she demonstrates consistent improved mentation.  11/10: Alert and oriented with intact attention on testing, however is somnolent. She denies feeling depressed and says she  is not hopeless about the future. She is unable to discuss with me the conversations she has been having with the primary team. Again, do not feel the patient is a candidate for ECT. Many medications have been tried for appetite stimulation with no success. Was on Cymbalta for mood/pain but this has been removed due to her declining renal function, replaced with Remeron. Will sign off at this time, please reconsult if needed.     Diagnoses:  Active Hospital problems: Principal Problem:   Chronic generalized abdominal pain Active Problems:   Acute worsening of stage 4 chronic kidney disease (HCC)   Generalized weakness   Pressure injury of skin   Nausea and vomiting   Diarrhea   Duodenal ulcer   Mucosal abnormality of stomach   Mucosal abnormality of esophagus   Food aversion   Hypoalbuminemia due to protein-calorie malnutrition ([place])     Plan  ## Safety and Observation Level:  - Based on my clinical evaluation, I estimate the patient to be at low risk of self harm in the current setting - At this time, we recommend a routine level of observation. This decision is based on my review of the chart including patient's history and current presentation, interview of the patient, mental status examination, and consideration of suicide risk including evaluating suicidal ideation, plan, intent, suicidal or self-harm behaviors, risk factors, and protective factors. This judgment is based on our ability to directly address suicide risk, implement suicide prevention strategies and develop a safety plan while the patient is in the clinical setting. Please contact our team if there is a concern that risk level has  changed.   ## Medications:  -- D/C Cymbalta given worsening renal function -- continue Periactin and Remeron  ## Medical Decision Making Capacity:  Not assessed  ## Further Work-up:  -- as per primary   ## Disposition:  -- TBD  ## Behavioral / Environmental:  -- NA  ##Legal  Status VOL  Thank you for this consult request. Recommendations have been communicated to the primary team.  We will sign off at this time.   Corky Sox, MD    Medical History: Past Medical History:  Diagnosis Date   Allergy    Arthritis    Chronic headaches    Constipation    Depression    Diabetes mellitus    GERD (gastroesophageal reflux disease)    Hyperlipidemia    Hypertension    Obesity    PAD (peripheral artery disease) ([place])    stage 4    Stroke [facility])     Surgical History: Past Surgical History:  Procedure Laterality Date   ABDOMINAL AORTOGRAM W/LOWER EXTREMITY N/A 12/17/2018   Procedure: ABDOMINAL AORTOGRAM W/LOWER EXTREMITY;  Surgeon: Waynetta Sandy, MD;  Location: [place] [facility];  Service: Cardiovascular;  Laterality: N/A;   ARTERY BIOPSY Left 03/17/2022   Procedure: BIOPSY TEMPORAL ARTERY;  Surgeon: Cherre Robins, MD;  Location: [place];  Service: Vascular;  Laterality: Left;   BIOPSY  09/01/2022   Procedure: BIOPSY;  Surgeon: Yetta Flock, MD;  Location: [facility];  Service: Gastroenterology;;   ESOPHAGOGASTRODUODENOSCOPY (EGD) WITH PROPOFOL N/A 09/01/2022   Procedure: ESOPHAGOGASTRODUODENOSCOPY (EGD) WITH PROPOFOL;  Surgeon: Yetta Flock, MD;  Location: [place];  Service: Gastroenterology;  Laterality: N/A;   ESOPHAGOGASTRODUODENOSCOPY (EGD) WITH PROPOFOL N/A 09/21/2022   Procedure: ESOPHAGOGASTRODUODENOSCOPY (EGD) WITH PROPOFOL;  Surgeon: Jackquline Denmark, MD;  Location: [place];  Service: Gastroenterology;  Laterality: N/A;   FEMORAL BYPASS  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 Bridgett Larsson   PERIPHERAL VASCULAR ATHERECTOMY Left 12/17/2018   Procedure: PERIPHERAL VASCULAR ATHERECTOMY;  Surgeon: Waynetta Sandy, MD;  Location: [place] [facility];  Service: Cardiovascular;  Laterality: Left;   PERIPHERAL VASCULAR BALLOON ANGIOPLASTY Left 12/17/2018   Procedure: PERIPHERAL VASCULAR  BALLOON ANGIOPLASTY;  Surgeon: Waynetta Sandy, MD;  Location: [place] [facility];  Service: Cardiovascular;  Laterality: Left;    Medications:   Current Facility-Administered Medications:    acetaminophen (TYLENOL) suppository 650 mg, 650 mg, Rectal, Q6H **OR** acetaminophen (TYLENOL) 160 MG/5ML solution 1,000 mg, 1,000 mg, Per Tube, Q6H, Charise Killian, MD, 1,000 mg at 09/30/22 1415   amLODipine (NORVASC) tablet 10 mg, 10 mg, Per Tube, Daily, Charise Killian, MD, 10 mg at 09/30/22 1052   Chlorhexidine Gluconate Cloth 2 % PADS 6 each, 6 each, Topical, Daily, Aldine Contes, MD, 6 each at 09/29/22 0825   cyproheptadine (PERIACTIN) 2 MG/5ML syrup 2 mg, 2 mg, Per Tube, QID, Jodell Cipro, Sriramkumar, MD, 2 mg at 09/30/22 1051   dicyclomine (BENTYL) capsule 10 mg, 10 mg, Per Tube, TID AC & HS, Charise Killian, MD, 10 mg at 09/30/22 1415   enoxaparin (LOVENOX) injection 30 mg, 30 mg, Subcutaneous, Q24H, Axel Filler, MD, 30 mg at 09/29/22 2246   famotidine (PEPCID) tablet 20 mg, 20 mg, Per Tube, QHS, Nooruddin, Saad, MD, 20 mg at 09/29/22 2200   feeding supplement (ENSURE ENLIVE / ENSURE PLUS) liquid 237 mL, 237 mL, Per Tube, TID BM, Charise Killian, MD   feeding supplement (OSMOLITE 1.5 CAL) liquid 1,000 mL, 1,000  mL, Per Tube, Continuous, Idelia Salm, RD, Last Rate: 50 mL/hr at 09/30/22 0601, 1,000 mL at 09/30/22 0601   feeding supplement (PROSource TF20) liquid 60 mL, 60 mL, Per Tube, Daily, Axel Filler, MD, 60 mL at 18/84/16 6063   folic acid (FOLVITE) tablet 1 mg, 1 mg, Per Tube, Daily, Charise Killian, MD, 1 mg at 09/30/22 1052   furosemide (LASIX) tablet 80 mg, 80 mg, Per Tube, Q8H, [place], Rayann N, DO, 80 mg at 09/30/22 1415   insulin aspart (novoLOG) injection 0-9 Units, 0-9 Units, Subcutaneous, Q4H, Aldine Contes, MD, 1 Units at 09/30/22 1217   lipase/protease/amylase (CREON) capsule 24,000 Units, 24,000 Units, Oral, TID, Rick Duff, MD, 24,000 Units at 09/26/22  0917   metoCLOPramide (REGLAN) 5 MG/5ML solution 10 mg, 10 mg, Per Tube, TID Larena Glassman, MD, 10 mg at 09/30/22 1052   mirtazapine (REMERON SOL-TAB) disintegrating tablet 15 mg, 15 mg, Per Alphonzo Cruise, MD, 15 mg at 09/29/22 2246   multivitamin (RENA-VIT) tablet 1 tablet, 1 tablet, Per Tube, QHS, Axel Filler, MD, 1 tablet at 09/29/22 2246   Oral care mouth rinse, 15 mL, Mouth Rinse, 4 times per day, Axel Filler, MD, 15 mL at 09/30/22 1215   Oral care mouth rinse, 15 mL, Mouth Rinse, PRN, Axel Filler, MD   phenol (CHLORASEPTIC) mouth spray 1 spray, 1 spray, Mouth/Throat, PRN, Rick Duff, MD   phenylephrine-shark liver oil-mineral oil-petrolatum (PREPARATION H) rectal ointment 1 Application, 1 Application, Rectal, BID PRN, Rick Duff, MD   prochlorperazine (COMPAZINE) tablet 10 mg, 10 mg, Per Tube, Q6H PRN, Charise Killian, MD   rosuvastatin (CRESTOR) tablet 10 mg, 10 mg, Per Tube, QPM, Nooruddin, Saad, MD, 10 mg at 09/29/22 1708   sodium bicarbonate tablet 1,300 mg, 1,300 mg, Per Tube, BID, Charise Killian, MD, 1,300 mg at 09/30/22 1051   sodium chloride tablet 1 g, 1 g, Per Tube, BID WC, Charise Killian, MD, 1 g at 09/30/22 1051   thiamine (VITAMIN B1) tablet 100 mg, 100 mg, Per Tube, Daily, Pham, Minh Q, RPH-CPP, 100 mg at 09/30/22 1051   vancomycin (VANCOCIN) 50 mg/mL oral solution SOLN 125 mg, 125 mg, Per Tube, QID, Dareen Piano, Nischal, MD, 125 mg at 09/30/22 1051   Zinc Oxide (TRIPLE PASTE) 12.8 % ointment, , Topical, PRN, Angelica Pou, MD, Given at 09/13/22 1617  Allergies: Allergies  Allergen Reactions   Nifedipine Er Other (See Comments)    Caused nose bleeds in higher doses (90 mg., namely)   Latex Rash   Lipitor [Atorvastatin] Swelling and Other (See Comments)    Lip swelling to lipitor Patient tolerates crestor       Objective  Vital signs:  Temp:  [97.7 F (36.5 C)-98.1 F (36.7 C)] 97.7 F (36.5 C) (11/10 0840) Pulse  Rate:  [72-89] 87 (11/10 0840) Resp:  [16-18] 18 (11/10 0840) BP: (121-155)/(62-69) 121/66 (11/10 0840) SpO2:  [93 %-100 %] 98 % (11/10 0840) Weight:  [132.5 kg] 132.5 kg (11/09 2037)  Psychiatric Specialty Exam:  Presentation  General Appearance:  Appropriate for Environment; Casual  Eye Contact: Fair  Speech: Slow  Speech Volume: Decreased   Mood and Affect  Mood: "Okay"  Affect: Constricted   Thought Process  Thought Processes: Coherent; Goal Directed; Linear  Descriptions of Associations:Intact  Orientation:Full (Time, Place and Person)  Thought Content:Logical  History of Schizophrenia/Schizoaffective disorder: none Duration of Psychotic Symptoms: none Hallucinations: none Ideas of Reference:None  Suicidal Thoughts: denies Homicidal Thoughts: denies  Sensorium  Memory: Immediate Good; Recent Good; Remote Good  Judgment: Fair  Insight: Fair   Materials engineer: Fair  Attention Span: Fair  Recall: [place] of Knowledge: Good  Language: Good   Psychomotor Activity  Psychomotor Activity:No data recorded  Assets  Assets: Communication Skills; Desire for Improvement; Social Support   Sleep  Sleep: fair   Physical Exam Constitutional:      Appearance: the patient is not toxic-appearing.  Pulmonary:     Effort: Pulmonary effort is normal.  Neurological:     General: No focal deficit present.     Mental Status: the patient is alert and oriented to person, place, and time.   Review of Systems  Respiratory:  Negative for shortness of breath.   Cardiovascular:  Negative for chest pain.  Gastrointestinal:  Negative for abdominal pain, constipation, diarrhea, nausea and vomiting.  Neurological:  Negative for headaches.   Blood pressure 121/66, pulse 87, temperature 97.7 F (36.5 C), temperature source Oral, resp. rate 18, height '5\' 8"'$  (1.727 m), weight 132.5 kg, SpO2 98 %. Body mass index is 44.4  kg/m.  Corky Sox, MD PGY-2

## 2022-09-30 NOTE — Progress Notes (Addendum)
Subjective:   Summary: Alicia [name] is a 71 y.o. year old female currently admitted on the IMTS HD#37 for diffuse abdominal pain as well as severely decreased PO intake likely multifactorial in etiology due to pancreatic insufficiency, chronic IMA occlusion, duodenal ulcers.  Overnight Events:  -NAEON. - Had extensive family conversation today regarding poor prognosis, options moving forward.  No immediate decision was reached, will continue to discuss and weigh options.  Objective:  Vital signs in last 24 hours: Vitals:   09/29/22 1642 09/29/22 2011 09/29/22 2037 09/30/22 0454  BP: 137/65 (!) 155/69  (!) 144/62  Pulse: 87 89  72  Resp: '16 18  18  '$ Temp: 97.8 F (36.6 C) 98 F (36.7 C)  98.1 F (36.7 C)  TempSrc: Oral Oral  Oral  SpO2: 100% 100%  93%  Weight:   132.5 kg   Height:       Supplemental O2: Room Air    Physical Exam:  Constitutional: NAD HEENT: Coretrack in place Cardiovascular: RRR, no murmurs rubs or gallops Pulmonary: Normal respiratory effort on room air Abdominal: minimal diffuse TTP, no guarding or rebound.  Distended but soft.  Periumbilical hernia unchanged.  Extremities: 2+ pitting edema of lower extremities bilaterally.  Upper extremities grossly edematous.   Neuro/Psych: Affect very flat today, disoriented and minimally interactive/verbal  Intake/Output Summary (Last 24 hours) at 09/30/2022 0644 Last data filed at 09/29/2022 2037 Gross per 24 hour  Intake 0 ml  Output 550 ml  Net -550 ml    Net IO Since Admission: 30,871.33 mL [09/30/22 0644]   Pertinent Labs:    Latest Ref Rng & Units 09/30/2022    4:38 AM 09/29/2022    6:28 AM 09/28/2022    7:25 AM  CBC  WBC 4.0 - 10.5 K/uL 9.8  10.1  11.0   Hemoglobin 12.0 - 15.0 g/dL 7.8  8.3  8.0   Hematocrit 36.0 - 46.0 % 23.1  23.3  24.0   Platelets 150 - 400 K/uL 296  289  282        Latest Ref Rng & Units 09/30/2022    4:38 AM 09/29/2022    6:28 AM 09/28/2022     7:25 AM  CMP  Glucose 70 - 99 mg/dL 192  170  131   BUN 8 - 23 mg/dL 91  87  85   Creatinine 0.44 - 1.00 mg/dL 4.37  4.38  4.21   Sodium 135 - 145 mmol/L 126  128  127   Potassium 3.5 - 5.1 mmol/L 4.5  4.0  3.9   Chloride 98 - 111 mmol/L 95  97  98   CO2 22 - 32 mmol/L '16  16  15   '$ Calcium 8.9 - 10.3 mg/dL 7.7  7.8  7.7   Total Protein 6.5 - 8.1 g/dL 4.4  4.5  4.4   Total Bilirubin 0.3 - 1.2 mg/dL 0.5  0.5  0.6   Alkaline Phos 38 - 126 U/L 369  348  366   AST 15 - 41 U/L 34  34  30   ALT 0 - 44 U/L 43  47  44   Last 3 CBGs 173, 148, 171 Urine protein to creatinine ratio 0.53, total urine protein 31 Ferritin 339, reticulocyte count percent 1.7, absolute 117    Assessment/Plan:   Patient Summary: Alicia [name] is a 71 y.o. with a pertinent PMH  of HFpEF, HTN, DMII, CKD4, PAD, prior CVA, who presented with progressive weakness and post-prandial abdominal pain and admitted for decreased functional ability, diffuse abdominal pain, and chronic diarrhea likely multifactorial in etiology due to pancreatic insufficiency, duodenal ulcers.  We will continue to discuss poor prognosis and consider options during family meeting this week.  #Abdominal Pain of Unkown Etiology #Chronic diarrhea #IMA occlusion #Duodenal ulcers #Pancreatic insufficiency #Lack of PO Intake  #Severe food aversion #C. Diff infection Had family meeting today and goals of care discussion.  [name] was minimally participating.  Overall, it is clear that [name] has a poor prognosis and further aggressive medical interventions be futile.  Family seems to understand this, but we have not reached a clear decision yet about goals of care or disposition possibilities.  We will continue to discuss with family and reach a decision in the coming days.  We will continue current medical management for now.  Appreciate assistance from palliative team and ethics committee. Plan:  -Discuss goals of care during family  meeting, appreciate palliative assistance.  Appreciate ethics committee recommendations regarding goals of care discussions as well. - Psych reconsulted, appreciate recs -Increase PO intake--will let her eat whatever appeals to her -CoreTrak with feeds at 50/hr -GI consulted, appreciate their recommendations -PO Protonix BID -Carafate at decreased dose for 1 additional day -Bentyl TID with meals -Vancomycin PO for c. diff -PT/OT -Continue Imodium -Crestor 10 mg daily -Reglan 10 mg TID with meals -Remeron 15 mg nightly -Peractin 2 mg 4 times daily until 11/08, then increase to 4 mg 4 times daily -Creon 24,000 units TID PO (cannot be administered via tube)  #AKI on CKD4 #Hyponatremia #Hyperkalemia BUN 91, Cr 4.37, UPC ratio elevated.  Hyponatremic at 126.   Overall, kidney function worsening and she is not a dialysis candidate.  Nephrology consulted for assistance, appreciate [facility] for assistance.  Holding free water, supplementing sodium for hyponatremia, starting PO Lasix 80 mg every 8 hours.  Patient is not a dialysis candidate - Avoid IV fluids. Need to replace IV access, but would hold off on central access. - Avoid nephrotoxic medications, will consider DC Cymbalta if GFR worsens - Trend CMP  #Right upper extremity pain RUE Doppler negative  #HFpEF Repeat echo shows EF 65 to 70% and grade 1 diastolic dysfunction as well as aortic sclerosis with mild regurgitation.  Remains significantly volume overloaded, will reassess with nephrology as above. - Restrict IV fluids - Appreciate nephrology recs  #Normocytic anemia Hgb improving at 8.0. RI hypoproloferative at 1.91.  TIBC was hemolyzed this morning.  Overall, many etiologies and overall no treatment indicated at this point. -Trend CBC -f\u TIBC  Code: Full Dispo: pending  Linus Galas MD PGY-1 Internal Medicine Resident Pager: [phone] Please contact the on call pager after 5 pm and on  weekends at [phone].

## 2022-09-30 NOTE — Progress Notes (Signed)
Due to pt swelling ring was removed off her right ring finger and given to her daughter

## 2022-09-30 NOTE — Progress Notes (Signed)
Physical Therapy Treatment Patient Details Name: Alicia [name] MRN: [id number] DOB: October 14, 1951 Today's Date: 09/30/2022   History of Present Illness 71 y.o. female presented to ED 10/4 with FTT. Cortrak placed 10/18 PMhx: CHf, HTN, CVA, T2DM, CKD, vit D deficiency, PAD, depression, HFpEF    PT Comments    Patient found in loose bowel incontinence. Required maxA to roll L/R for pericare and lift pad placement. Utilized maximove to transfer OOB to recliner. Complaining of R UE pain with touch and movement. Positioned on pillow due to edema and redness. Continue to recommend SNF for ongoing Physical Therapy.       Recommendations for follow up therapy are one component of a multi-disciplinary discharge planning process, led by the attending physician.  Recommendations may be updated based on patient status, additional functional criteria and insurance authorization.  Follow Up Recommendations  Skilled nursing-short term rehab (<3 hours/day) Can patient physically be transported by private vehicle: No   Assistance Recommended at Discharge Frequent or constant Supervision/Assistance  Patient can return home with the following Assistance with cooking/housework;Direct supervision/assist for medications management;Assist for transportation;Two people to help with walking and/or transfers;Help with stairs or ramp for entrance;A lot of help with bathing/dressing/bathroom;Assistance with feeding   Equipment Recommendations  Wheelchair (measurements PT);Wheelchair cushion (measurements PT);Hospital bed (hoyer lift)    Recommendations for Other Services       Precautions / Restrictions Precautions Precautions: Fall Precaution Comments: Skin integrity, incontinent of bowel Restrictions Weight Bearing Restrictions: No     Mobility  Bed Mobility Overal bed mobility: Needs Assistance Bed Mobility: Rolling Rolling: Max assist         General bed mobility comments: rolled side to  side for pericare due to loose bowel incontinence and lift pad placement. MaxA overall to roll due to decreased initiation    Transfers Overall transfer level: Needs assistance Equipment used: Ambulation equipment used Transfers: Bed to chair/wheelchair/BSC             General transfer comment: maximove utilized to transfer to recliner after bowel clean up Transfer via Lift Equipment: [employer]  Ambulation/Gait                   Stairs             Wheelchair Mobility    Modified Rankin (Stroke Patients Only)       Balance Overall balance assessment: Needs assistance                                          Cognition Arousal/Alertness: Awake/alert Behavior During Therapy: WFL for tasks assessed/performed Overall Cognitive Status: Impaired/Different from baseline Area of Impairment: Awareness, Problem solving                           Awareness: Emergent Problem Solving: Difficulty sequencing, Decreased initiation, Requires verbal cues, Requires tactile cues          Exercises      General Comments        Pertinent Vitals/Pain Pain Assessment Pain Assessment: Faces Faces Pain Scale: Hurts little more Pain Location: R UE Pain Descriptors / Indicators: Grimacing Pain Intervention(s): Monitored during session    Home Living                          Prior Function  PT Goals (current goals can now be found in the care plan section) Acute Rehab PT Goals PT Goal Formulation: With patient Time For Goal Achievement: 10/14/22 Potential to Achieve Goals: Fair Progress towards PT goals: Progressing toward goals    Frequency    Min 2X/week      PT Plan Current plan remains appropriate    Co-evaluation              AM-PAC PT "6 Clicks" Mobility   Outcome Measure  Help needed turning from your back to your side while in a flat bed without using bedrails?: Total Help needed  moving from lying on your back to sitting on the side of a flat bed without using bedrails?: Total Help needed moving to and from a bed to a chair (including a wheelchair)?: Total Help needed standing up from a chair using your arms (e.g., wheelchair or bedside chair)?: Total Help needed to walk in hospital room?: Total Help needed climbing 3-5 steps with a railing? : Total 6 Click Score: 6    End of Session   Activity Tolerance: Patient tolerated treatment well Patient left: in chair;with call bell/phone within reach;with family/visitor present;with nursing/sitter in room (lift pad in chair) Nurse Communication: Mobility status;Need for lift equipment PT Visit Diagnosis: Other abnormalities of gait and mobility (R26.89);History of falling (Z91.81);Muscle weakness (generalized) (M62.81)     Time: 7948-0165 PT Time Calculation (min) (ACUTE ONLY): 29 min  Charges:  $Therapeutic Activity: 23-37 mins                     Alicia [name] PT, DPT Acute Rehabilitation Services Office [phone]    Alicia [name] 09/30/2022, 1:32 PM

## 2022-10-01 DIAGNOSIS — R109 Unspecified abdominal pain: Secondary | ICD-10-CM | POA: Diagnosis not present

## 2022-10-01 DIAGNOSIS — R531 Weakness: Secondary | ICD-10-CM | POA: Diagnosis not present

## 2022-10-01 DIAGNOSIS — N179 Acute kidney failure, unspecified: Secondary | ICD-10-CM

## 2022-10-01 DIAGNOSIS — K529 Noninfective gastroenteritis and colitis, unspecified: Secondary | ICD-10-CM | POA: Diagnosis not present

## 2022-10-01 DIAGNOSIS — E8809 Other disorders of plasma-protein metabolism, not elsewhere classified: Secondary | ICD-10-CM | POA: Diagnosis not present

## 2022-10-01 DIAGNOSIS — R627 Adult failure to thrive: Secondary | ICD-10-CM | POA: Diagnosis not present

## 2022-10-01 DIAGNOSIS — E46 Unspecified protein-calorie malnutrition: Secondary | ICD-10-CM

## 2022-10-01 LAB — COMPREHENSIVE METABOLIC PANEL
ALT: 58 U/L — ABNORMAL HIGH (ref 0–44)
AST: 68 U/L — ABNORMAL HIGH (ref 15–41)
Albumin: <1.5 g/dL — ABNORMAL LOW (ref 3.5–5.0)
Alkaline Phosphatase: 444 U/L — ABNORMAL HIGH (ref 38–126)
Anion gap: 20 — ABNORMAL HIGH (ref 5–15)
BUN: 96 mg/dL — ABNORMAL HIGH (ref 8–23)
CO2: 13 mmol/L — ABNORMAL LOW (ref 22–32)
Calcium: 7.7 mg/dL — ABNORMAL LOW (ref 8.9–10.3)
Chloride: 90 mmol/L — ABNORMAL LOW (ref 98–111)
Creatinine, Ser: 4.25 mg/dL — ABNORMAL HIGH (ref 0.44–1.00)
GFR, Estimated: 11 mL/min — ABNORMAL LOW (ref >60)
Glucose, Bld: 134 mg/dL — ABNORMAL HIGH (ref 70–99)
Potassium: 5 mmol/L (ref 3.5–5.1)
Sodium: 123 mmol/L — ABNORMAL LOW (ref 135–145)
Total Bilirubin: 0.4 mg/dL (ref 0.3–1.2)
Total Protein: 4.3 g/dL — ABNORMAL LOW (ref 6.5–8.1)

## 2022-10-01 LAB — CBC
HCT: 25.8 % — ABNORMAL LOW (ref 36.0–46.0)
Hemoglobin: 8.7 g/dL — ABNORMAL LOW (ref 12.0–15.0)
MCH: 34.3 pg — ABNORMAL HIGH (ref 26.0–34.0)
MCHC: 33.7 g/dL (ref 30.0–36.0)
MCV: 101.6 fL — ABNORMAL HIGH (ref 80.0–100.0)
Platelets: 208 10*3/uL (ref 150–400)
RBC: 2.54 MIL/uL — ABNORMAL LOW (ref 3.87–5.11)
RDW: 21.5 % — ABNORMAL HIGH (ref 11.5–15.5)
WBC: 10.5 10*3/uL (ref 4.0–10.5)
nRBC: 0 % (ref 0.0–0.2)

## 2022-10-01 LAB — GLUCOSE, CAPILLARY
Glucose-Capillary: 127 mg/dL — ABNORMAL HIGH (ref 70–99)
Glucose-Capillary: 140 mg/dL — ABNORMAL HIGH (ref 70–99)
Glucose-Capillary: 154 mg/dL — ABNORMAL HIGH (ref 70–99)
Glucose-Capillary: 163 mg/dL — ABNORMAL HIGH (ref 70–99)

## 2022-10-01 MED ORDER — MORPHINE SULFATE (CONCENTRATE) 10 MG/0.5ML PO SOLN: 10.0000 mg | ORAL | Status: DC | PRN

## 2022-10-01 MED ORDER — GLYCOPYRROLATE 1 MG PO TABS
1.0000 mg | ORAL_TABLET | ORAL | Status: DC | PRN
  Filled 2022-10-01: qty 1

## 2022-10-01 MED ORDER — MORPHINE SULFATE (CONCENTRATE) 10 MG/0.5ML PO SOLN: 5.0000 mg | ORAL | Status: DC | PRN

## 2022-10-01 MED ORDER — ONDANSETRON HCL 4 MG/2ML IJ SOLN: 4.0000 mg | Freq: Four times a day (QID) | INTRAMUSCULAR | Status: DC | PRN

## 2022-10-01 MED ORDER — METOCLOPRAMIDE HCL 5 MG/5ML PO SOLN
10.0000 mg | Freq: Three times a day (TID) | ORAL | Status: DC
  Filled 2022-10-01 (×8): qty 10

## 2022-10-01 MED ORDER — ACETAMINOPHEN 325 MG PO TABS: 650.0000 mg | ORAL_TABLET | Freq: Four times a day (QID) | ORAL | Status: DC | PRN

## 2022-10-01 MED ORDER — GLYCOPYRROLATE 0.2 MG/ML IJ SOLN: 0.2000 mg | INTRAMUSCULAR | Status: DC | PRN

## 2022-10-01 MED ORDER — SODIUM CHLORIDE 0.9 % IV SOLN: INTRAVENOUS | Status: DC

## 2022-10-01 MED ORDER — POLYVINYL ALCOHOL 1.4 % OP SOLN: 1.0000 [drp] | Freq: Four times a day (QID) | OPHTHALMIC | Status: DC | PRN

## 2022-10-01 MED ORDER — ONDANSETRON 4 MG PO TBDP: 4.0000 mg | ORAL_TABLET | Freq: Four times a day (QID) | ORAL | Status: DC | PRN

## 2022-10-01 MED ORDER — ACETAMINOPHEN 650 MG RE SUPP: 650.0000 mg | Freq: Four times a day (QID) | RECTAL | Status: DC | PRN

## 2022-10-01 MED ORDER — LORAZEPAM 2 MG/ML PO CONC
2.0000 mg | ORAL | Status: DC | PRN
  Filled 2022-10-01: qty 1

## 2022-10-01 MED ORDER — LORAZEPAM 2 MG/ML PO CONC
0.5000 mg | ORAL | Status: DC | PRN
  Filled 2022-10-01: qty 0.25

## 2022-10-01 MED ORDER — SUCRALFATE 1 GM/10ML PO SUSP
1.0000 g | Freq: Three times a day (TID) | ORAL | Status: DC
  Filled 2022-10-01: qty 10

## 2022-10-01 MED ORDER — MORPHINE SULFATE (CONCENTRATE) 10 MG/0.5ML PO SOLN
10.0000 mg | ORAL | Status: DC | PRN
  Filled 2022-10-01: qty 0.5

## 2022-10-01 MED ORDER — HYDROMORPHONE HCL 1 MG/ML PO LIQD
0.5000 mg | ORAL | Status: DC | PRN
  Administered 2022-10-02: 0.5 mg via ORAL
  Filled 2022-10-01 (×2): qty 1

## 2022-10-01 NOTE — Progress Notes (Signed)
Subjective:   Summary: Alicia [name] is a 71 y.o. year old female currently admitted on the IMTS HD#37 for diffuse abdominal pain as well as severely decreased PO intake likely multifactorial in etiology due to pancreatic insufficiency, chronic IMA occlusion, duodenal ulcers.  Overnight Events:  -NAEON. - had an extensive discussion with family regarding Alicia [name]' prognosis, they have decided to transition to comfort care.  Objective:  Vital signs in last 24 hours: Vitals:   09/30/22 1634 09/30/22 2117 10/01/22 0538 10/01/22 0945  BP: 113/61 (!) 109/53 (!) 119/56 (!) 119/56  Pulse: 79 83 78 80  Resp: '20 18 19 18  '$ Temp: 97.6 F (36.4 C) (!) 97.3 F (36.3 C) (!) 97.5 F (36.4 C) 98.2 F (36.8 C)  TempSrc: Oral Oral Oral   SpO2: 96% 99% 100% 100%  Weight:      Height:       Supplemental O2: Room Air    Physical Exam:  Constitutional: NAD HEENT: Coretrack in place Cardiovascular: RRR, no murmurs rubs or gallops Pulmonary: Normal respiratory effort on room air Abdominal: minimal diffuse TTP, no guarding or rebound.  Distended but soft.  Periumbilical hernia unchanged.  Extremities: 2+ pitting edema of lower extremities bilaterally.  Upper extremities grossly edematous.   Neuro/Psych: Affect very flat today, disoriented and minimally interactive/verbal  Intake/Output Summary (Last 24 hours) at 10/01/2022 1257 Last data filed at 10/01/2022 0900 Gross per 24 hour  Intake 2135 ml  Output 550 ml  Net 1585 ml   Net IO Since Admission: 32,456.33 mL [10/01/22 1257]   Pertinent Labs:    Latest Ref Rng & Units 10/01/2022    6:16 AM 09/30/2022    4:38 AM 09/29/2022    6:28 AM  CBC  WBC 4.0 - 10.5 K/uL 10.5  9.8  10.1   Hemoglobin 12.0 - 15.0 g/dL 8.7  7.8  8.3   Hematocrit 36.0 - 46.0 % 25.8  23.1  23.3   Platelets 150 - 400 K/uL 208  296  289        Latest Ref Rng & Units 10/01/2022    6:16 AM 09/30/2022    4:38 AM 09/29/2022    6:28 AM   CMP  Glucose 70 - 99 mg/dL 134  192  170   BUN 8 - 23 mg/dL 96  91  87   Creatinine 0.44 - 1.00 mg/dL 4.25  4.37  4.38   Sodium 135 - 145 mmol/L 123  126  128   Potassium 3.5 - 5.1 mmol/L 5.0  4.5  4.0   Chloride 98 - 111 mmol/L 90  95  97   CO2 22 - 32 mmol/L '13  16  16   '$ Calcium 8.9 - 10.3 mg/dL 7.7  7.7  7.8   Total Protein 6.5 - 8.1 g/dL 4.3  4.4  4.5   Total Bilirubin 0.3 - 1.2 mg/dL 0.4  0.5  0.5   Alkaline Phos 38 - 126 U/L 444  369  348   AST 15 - 41 U/L 68  34  34   ALT 0 - 44 U/L 58  43  47   Last 3 CBGs 163, 140, 127     Assessment/Plan:   Patient Summary: Alicia [name] is a 71 y.o. with a pertinent PMH of HFpEF, HTN, DMII, CKD4, PAD, prior CVA, who presented with progressive weakness and post-prandial abdominal pain and admitted  for decreased functional ability, diffuse abdominal pain, and chronic diarrhea likely multifactorial in etiology due to pancreatic insufficiency, duodenal ulcers. Despite maximal care and artificial feeds, Alicia [name] has not been able to improve PO intake and she is now approaching ESRD, concern for hepatic failure, severe malnutrition with undetectable albumin and low protein. After extensive discussion with family regarding poor prognosis and the futility of further interventions, the family has decided to transition to DNR/comfort care and are seeking options for disposition.  #Abdominal Pain of Unkown Etiology #Chronic diarrhea #IMA occlusion #Duodenal ulcers #Pancreatic insufficiency #Lack of PO Intake  #Severe food aversion #C. Diff infection #AKI on CKD4 #Hyponatremia #Hyperkalemia #HFpEF #Normocytic anemia  Code: DNR Dispo: comfort care, pending  Linus Galas MD PGY-1 Internal Medicine Resident Pager: [phone] Please contact the on call pager after 5 pm and on weekends at [phone].

## 2022-10-01 NOTE — Progress Notes (Signed)
Patient ID: Alicia [name], female   DOB: [birth year], 71 y.o.   MRN: [id number]      Progress Note from the Palliative Medicine Team at [facility]   Patient Name: Alicia [name]        Date: 10/01/2022 DOB: [birth year]  Age: 71 y.o. MRN#: [id number] Attending Physician: Angelica Pou, MD Primary Care Physician: Rick Duff, MD Admit Date: 08/24/2022   Medical records reviewed   71 y.o. female  admitted on 08/24/2022 with  complex past medical history;  H.pylori gastritis (treated in 2020, follow up eradication testing negative), chronic abdominal pain,  chronic nausea,  colon polyps, pancreatic insufficiency, chronic IMA occlusion, duodenal ulcers, obesity    Admitted with weakness, post-prandial abdominal pain, N/V, loose stools.  AKI on CKD 4, refusing to eat/psych consulted, depression.    Patient/family  faces treatment option decisions, advanced directive decisions and anticipatory care needs.    Core track in place for nutritional support and tolerated at this time.  Now for consideration for G-Tube placement.  Patient is failing to thrive within the context of full medical support.   Large family gathered for continued discussion regarding next steps treatment plan.  Patient's daughter Alicia [name] and her husband [name], two of  patient's sisters and  two nieces are present.  Education offered on current medical situation specific to patient's overall failure to thrive within the context of full medical support.  Education offered on the concept of human mortality and adult failure to thrive, and the limitations of medical interventions to prolong quality of life when the body fails to thrive.  Education offered on patient's long-term poor prognosis  Education offered on the difference between an aggressive medical intervention path and a palliative comfort path.  for this patient, at this time,  in this situation.  Education offered on the natural  trajectory and expectations at end-of-life.  Plan of care: -DNR/DNI -No artificial feeding or hydration now or in the future--DC core track -No further diagnostics, L fingersticks,  IV medication labs -Symptom management -[facility] is for residential hospice for EOL care, family mentions [facility]--prognosis is days to a week        -Family prepared that anything could happen at any time  Questions and concerns addressed     Total time spent on the unit was 75  minutes  PMT will continue to support holistically  Wadie Lessen NP  Palliative Medicine Team Team Phone # [phone] Pager [phone]

## 2022-10-01 NOTE — Plan of Care (Signed)
Problem: Education: Goal: Ability to describe self-care measures that may prevent or decrease complications (Diabetes Survival Skills Education) will improve 10/01/2022 0943 by Anastasio Auerbach, RN Outcome: Not Progressing 10/01/2022 0942 by Anastasio Auerbach, RN Outcome: Progressing Goal: Individualized Educational Video(s) 10/01/2022 0943 by Anastasio Auerbach, RN Outcome: Not Progressing  Problem: Coping: Goal: Ability to adjust to condition or change in health will improve 10/01/2022 0943 by Anastasio Auerbach, RN Outcome: Not Progressing  Problem: Fluid Volume: Goal: Ability to maintain a balanced intake and output will improve 10/01/2022 0943 by Anastasio Auerbach, RN Outcome: Not Progressing   Problem: Health Behavior/Discharge Planning: Goal: Ability to identify and utilize available resources and services will improve 10/01/2022 0943 by Anastasio Auerbach, RN Outcome: Not Progressing 10/01/2022 0942 by Anastasio Auerbach, RN Outcome: Progressing   Problem: Metabolic: Goal: Ability to maintain appropriate glucose levels will improve 10/01/2022 0943 by Anastasio Auerbach, RN Outcome: Not Progressing    Problem: Nutritional: Goal: Maintenance of adequate nutrition will improve 10/01/2022 0943 by Anastasio Auerbach, RN Outcome: Not Progressing  Goal: Progress toward achieving an optimal weight will improve 10/01/2022 0943 by Anastasio Auerbach, RN Outcome: Not Progressing  Problem: Skin Integrity: Goal: Risk for impaired skin integrity will decrease 10/01/2022 0943 by Anastasio Auerbach, RN Outcome: Not Progressing  Problem: Tissue Perfusion: Goal: Adequacy of tissue perfusion will improve 10/01/2022 0943 by Anastasio Auerbach, RN Outcome: Not Progressing   Problem: Education: Goal: Knowledge of General Education information will improve Description: Including pain rating scale, medication(s)/side effects and non-pharmacologic comfort measures 10/01/2022 0943 by Anastasio Auerbach, RN Outcome: Not  Progressing   Problem: Health Behavior/Discharge Planning: Goal: Ability to manage health-related needs will improve 10/01/2022 0943 by Anastasio Auerbach, RN Outcome: Not Progressing  Problem: Clinical Measurements: Goal: Ability to maintain clinical measurements within normal limits will improve 10/01/2022 0943 by Anastasio Auerbach, RN Outcome: Not Progressing  Goal: Will remain free from infection 10/01/2022 0943 by Anastasio Auerbach, RN Outcome: Not Progressing  Goal: Diagnostic test results will improve 10/01/2022 0943 by Anastasio Auerbach, RN Outcome: Not Progressing  Goal: Respiratory complications will improve 10/01/2022 0943 by Anastasio Auerbach, RN Outcome: Not Progressing  Goal: Cardiovascular complication will be avoided 10/01/2022 0943 by Anastasio Auerbach, RN Outcome: Not Progressing   Problem: Activity: Goal: Risk for activity intolerance will decrease 10/01/2022 0943 by Anastasio Auerbach, RN Outcome: Not Progressing   Problem: Nutrition: Goal: Adequate nutrition will be maintained 10/01/2022 0943 by Anastasio Auerbach, RN Outcome: Not Progressing   Problem: Coping: Goal: Level of anxiety will decrease 10/01/2022 0943 by Anastasio Auerbach, RN Outcome: Not Progressing   Problem: Elimination: Goal: Will not experience complications related to bowel motility 10/01/2022 0943 by Anastasio Auerbach, RN Outcome: Not Progressing Goal: Will not experience complications related to urinary retention 10/01/2022 0943 by Anastasio Auerbach, RN Outcome: Not Progressing  Problem: Pain Managment: Goal: General experience of comfort will improve 10/01/2022 0943 by Anastasio Auerbach, RN Outcome: Not Progressing   Problem: Safety: Goal: Ability to remain free from injury will improve 10/01/2022 0943 by Anastasio Auerbach, RN Outcome: Not Progressing  Problem: Skin Integrity: Goal: Risk for impaired skin integrity will decrease 10/01/2022 0943 by Anastasio Auerbach, RN Outcome: Not Progressing   Problem:  Nutrition Goal: Patient maintains adequate hydration 10/01/2022 0943 by Anastasio Auerbach, RN Outcome: Not Progressing 10/01/2022 0942 by Anastasio Auerbach, RN Outcome: Progressing Goal: Patient maintains weight 10/01/2022 0943 by Anastasio Auerbach, RN Outcome: Not Progressing  Goal: Patient/Family demonstrates understanding of diet 10/01/2022 0943 by Anastasio Auerbach, RN Outcome: Not Progressing  Goal: Patient/Family independently  completes tube feeding 10/01/2022 0943 by Anastasio Auerbach, RN Outcome: Not Progressing  Goal: Patient will have no more than 5 lb weight change during LOS 10/01/2022 0943 by Anastasio Auerbach, RN Outcome: Not Progressing  Goal: Patient will utilize adaptive techniques to administer nutrition 10/01/2022 0943 by Anastasio Auerbach, RN Outcome: Not Progressing  Goal: Patient will verbalize dietary restrictions 10/01/2022 0943 by Anastasio Auerbach, RN Outcome: Not Progressing

## 2022-10-02 MED ORDER — HYDROMORPHONE HCL 1 MG/ML PO LIQD
1.0000 mg | ORAL | Status: DC | PRN
  Administered 2022-10-02 – 2022-10-04 (×3): 1 mg via ORAL
  Filled 2022-10-02 (×3): qty 1

## 2022-10-02 NOTE — Progress Notes (Signed)
Manufacturing engineer [facility])  Referral received for [employer].  Left message for her NOK, dtr Satomi.   [facility] will continue to follow.  Venia Carbon DNP, RN [facility] Liaison

## 2022-10-02 NOTE — Plan of Care (Signed)
  Problem: Education: Goal: Ability to describe self-care measures that may prevent or decrease complications (Diabetes Survival Skills Education) will improve Outcome: Not Progressing Goal: Individualized Educational Video(s) Outcome: Not Progressing   Problem: Coping: Goal: Ability to adjust to condition or change in health will improve Outcome: Not Progressing   Problem: Fluid Volume: Goal: Ability to maintain a balanced intake and output will improve Outcome: Not Progressing   Problem: Health Behavior/Discharge Planning: Goal: Ability to identify and utilize available resources and services will improve Outcome: Not Progressing Goal: Ability to manage health-related needs will improve Outcome: Not Progressing   Problem: Metabolic: Goal: Ability to maintain appropriate glucose levels will improve Outcome: Not Progressing   Problem: Nutritional: Goal: Maintenance of adequate nutrition will improve Outcome: Not Progressing Goal: Progress toward achieving an optimal weight will improve Outcome: Not Progressing   Problem: Skin Integrity: Goal: Risk for impaired skin integrity will decrease Outcome: Not Progressing   Problem: Tissue Perfusion: Goal: Adequacy of tissue perfusion will improve Outcome: Not Progressing   Problem: Education: Goal: Knowledge of General Education information will improve Description: Including pain rating scale, medication(s)/side effects and non-pharmacologic comfort measures Outcome: Not Progressing   Problem: Health Behavior/Discharge Planning: Goal: Ability to manage health-related needs will improve Outcome: Not Progressing   Problem: Clinical Measurements: Goal: Ability to maintain clinical measurements within normal limits will improve Outcome: Not Progressing Goal: Will remain free from infection Outcome: Not Progressing Goal: Diagnostic test results will improve Outcome: Not Progressing Goal: Respiratory complications will  improve Outcome: Not Progressing Goal: Cardiovascular complication will be avoided Outcome: Not Progressing   Problem: Activity: Goal: Risk for activity intolerance will decrease Outcome: Not Progressing   Problem: Nutrition: Goal: Adequate nutrition will be maintained Outcome: Not Progressing   Problem: Coping: Goal: Level of anxiety will decrease Outcome: Not Progressing   Problem: Elimination: Goal: Will not experience complications related to bowel motility Outcome: Not Progressing Goal: Will not experience complications related to urinary retention Outcome: Not Progressing   Problem: Pain Managment: Goal: General experience of comfort will improve Outcome: Not Progressing   Problem: Safety: Goal: Ability to remain free from injury will improve Outcome: Not Progressing   Problem: Skin Integrity: Goal: Risk for impaired skin integrity will decrease Outcome: Not Progressing   Problem: Nutrition Goal: Patient maintains adequate hydration Outcome: Not Progressing Goal: Patient maintains weight Outcome: Not Progressing Goal: Patient/Family demonstrates understanding of diet Outcome: Not Progressing Goal: Patient/Family independently completes tube feeding Outcome: Not Progressing Goal: Patient will have no more than 5 lb weight change during LOS Outcome: Not Progressing Goal: Patient will utilize adaptive techniques to administer nutrition Outcome: Not Progressing Goal: Patient will verbalize dietary restrictions Outcome: Not Progressing

## 2022-10-02 NOTE — Progress Notes (Signed)
Per Palliative Care APP, pt's family agreeable to hospice home placement and requesting [facility]. Referral made to [facility] with [facility]. No bed availability at [facility] today. SW will follow.   Wandra Feinstein, MSW, LCSW [phone] (coverage)

## 2022-10-02 NOTE — Progress Notes (Signed)
HD#33 SUBJECTIVE:  Patient Summary: Alicia [name] is a 71 y.o. with a pertinent PMH of HFpEF, HTN, DMII, CKD4, PAD, prior CVA, who presented with progressive weakness and post-prandial abdominal pain and admitted for decreased functional ability, diffuse abdominal pain, and chronic diarrhea likely multifactorial in etiology due to pancreatic insufficiency, duodenal ulcers who is now on comfort care.  Overnight Events: None  Interim History: Alicia [name] is in pain despite receiving dose of morphine oral suspension this morning, in her stomach and feet. Otherwise she states that there is nothing else that she needs from our team at this time.  OBJECTIVE:  Vital Signs: Vitals:   10/01/22 0538 10/01/22 0945 10/02/22 0552 10/02/22 1025  BP: (!) 119/56 (!) 119/56 (!) 116/51 (!) 116/53  Pulse: 78 80 69 66  Resp: '19 18 16 18  '$ Temp: (!) 97.5 F (36.4 C) 98.2 F (36.8 C) 98.2 F (36.8 C) 98 F (36.7 C)  TempSrc: Oral   Oral  SpO2: 100% 100% 93% 95%  Weight:      Height:       Supplemental O2: Room Air SpO2: 95 % O2 Flow Rate (L/min): 0 L/min  Filed Weights   09/28/22 0500 09/29/22 0500 09/29/22 2037  Weight: 132.5 kg 132.5 kg 132.5 kg     Intake/Output Summary (Last 24 hours) at 10/02/2022 1122 Last data filed at 10/02/2022 0900 Gross per 24 hour  Intake 0 ml  Output 520 ml  Net -520 ml   Net IO Since Admission: 31,936.33 mL [10/02/22 1122]  Physical Exam: Constitutional:Tearful while laying in bed, complaining of abdominal and feet pain. Pulm: Normal work of breathing on room air. Abdomen:Soft, distended, no point tenderness, umbilical hernia that is unchanged.  Neuro:Alert and oriented x3. No focal deficit noted. Psych:Tearful from pain, does not make eye contact.  Patient Lines/Drains/Airways Status     Active Line/Drains/Airways     Name Placement date Placement time Site Days   Urethral Catheter Mila Homer Straight-tip 16 Fr. 09/28/22  1758   Straight-tip  4   Pressure Injury 08/25/22 Sacrum Medial Stage 1 -  Intact skin with non-blanchable redness of a localized area usually over a bony prominence. 08/25/22  2000  -- 38   Wound / Incision (Open or Dehisced) 09/08/22 Non-pressure wound Thigh Left;Posterior;Proximal 1cm by 1cm, non pressure wound 09/08/22  1200  Thigh  24   Wound / Incision (Open or Dehisced) 09/08/22 Non-pressure wound Thigh Posterior;Proximal;Right 1cm by 1cm non pressure wound 09/08/22  1200  Thigh  24             ASSESSMENT/PLAN:  Assessment: Principal Problem:   Chronic generalized abdominal pain Active Problems:   Acute worsening of stage 4 chronic kidney disease (HCC)   Generalized weakness   Pressure injury of skin   Nausea and vomiting   Diarrhea   Duodenal ulcer   Mucosal abnormality of stomach   Mucosal abnormality of esophagus   Food aversion   Hypoalbuminemia due to protein-calorie malnutrition ([place])   Plan:  #Comfort care Alicia [name] has full comfort measures on at this time. She reported continued pain despite dilaudid oral suspension this morning, and in speaking with her nurse she was sleeping comfortably in recheck. I have asked to be notified of any further pain minimally or unresponsive to current therapy so that adjustments can be made to maximize pain control. I have reached out to social work to ensure process is started to transition her to [employer]; they were  not aware of this need yet and will help start this process now.  #Abdominal Pain of Unkown Etiology #Chronic diarrhea #IMA occlusion #Duodenal ulcers #Pancreatic insufficiency #Lack of PO Intake  #Severe food aversion #C. Diff infection #AKI on CKD4 #Hyponatremia #Hyperkalemia #HFpEF #Normocytic anemia  Best Practice: Diet: Regular diet Code: DNR AB: None Family Contact: Modean, daughter, called and notified. DISPO: Anticipated discharge to  inpatient hospice  pending  bed availability  .  Signature: Farrel Gordon, D.O.  Internal Medicine Resident, PGY-2 [facility]  Pager: [phone]   Please contact the on call pager after 5 pm and on weekends at [phone].

## 2022-10-03 DIAGNOSIS — R531 Weakness: Secondary | ICD-10-CM | POA: Diagnosis not present

## 2022-10-03 DIAGNOSIS — R0609 Other forms of dyspnea: Secondary | ICD-10-CM | POA: Diagnosis not present

## 2022-10-03 DIAGNOSIS — Z515 Encounter for palliative care: Secondary | ICD-10-CM | POA: Diagnosis not present

## 2022-10-03 DIAGNOSIS — R1084 Generalized abdominal pain: Secondary | ICD-10-CM | POA: Diagnosis not present

## 2022-10-03 NOTE — Plan of Care (Signed)
  Problem: Education: Goal: Ability to describe self-care measures that may prevent or decrease complications (Diabetes Survival Skills Education) will improve Outcome: Not Progressing Goal: Individualized Educational Video(s) Outcome: Not Progressing   Problem: Coping: Goal: Ability to adjust to condition or change in health will improve Outcome: Not Progressing   Problem: Fluid Volume: Goal: Ability to maintain a balanced intake and output will improve Outcome: Not Progressing   Problem: Health Behavior/Discharge Planning: Goal: Ability to identify and utilize available resources and services will improve Outcome: Not Progressing Goal: Ability to manage health-related needs will improve Outcome: Not Progressing   Problem: Metabolic: Goal: Ability to maintain appropriate glucose levels will improve Outcome: Not Progressing   Problem: Nutritional: Goal: Maintenance of adequate nutrition will improve Outcome: Not Progressing Goal: Progress toward achieving an optimal weight will improve Outcome: Not Progressing   Problem: Skin Integrity: Goal: Risk for impaired skin integrity will decrease Outcome: Not Progressing   Problem: Tissue Perfusion: Goal: Adequacy of tissue perfusion will improve Outcome: Not Progressing   Problem: Education: Goal: Knowledge of General Education information will improve Description: Including pain rating scale, medication(s)/side effects and non-pharmacologic comfort measures Outcome: Not Progressing   Problem: Health Behavior/Discharge Planning: Goal: Ability to manage health-related needs will improve Outcome: Not Progressing   Problem: Clinical Measurements: Goal: Ability to maintain clinical measurements within normal limits will improve Outcome: Not Progressing Goal: Will remain free from infection Outcome: Not Progressing Goal: Diagnostic test results will improve Outcome: Not Progressing Goal: Respiratory complications will  improve Outcome: Not Progressing Goal: Cardiovascular complication will be avoided Outcome: Not Progressing   Problem: Activity: Goal: Risk for activity intolerance will decrease Outcome: Not Progressing   Problem: Nutrition: Goal: Adequate nutrition will be maintained Outcome: Not Progressing   Problem: Coping: Goal: Level of anxiety will decrease Outcome: Not Progressing   Problem: Elimination: Goal: Will not experience complications related to bowel motility Outcome: Not Progressing Goal: Will not experience complications related to urinary retention Outcome: Not Progressing   Problem: Pain Managment: Goal: General experience of comfort will improve Outcome: Not Progressing   Problem: Safety: Goal: Ability to remain free from injury will improve Outcome: Not Progressing   Problem: Skin Integrity: Goal: Risk for impaired skin integrity will decrease Outcome: Not Progressing   Problem: Nutrition Goal: Patient maintains adequate hydration Outcome: Not Progressing Goal: Patient maintains weight Outcome: Not Progressing Goal: Patient/Family demonstrates understanding of diet Outcome: Not Progressing Goal: Patient/Family independently completes tube feeding Outcome: Not Progressing Goal: Patient will have no more than 5 lb weight change during LOS Outcome: Not Progressing Goal: Patient will utilize adaptive techniques to administer nutrition Outcome: Not Progressing Goal: Patient will verbalize dietary restrictions Outcome: Not Progressing

## 2022-10-03 NOTE — Progress Notes (Deleted)
Patient ID: WANNA GULLY, female   DOB: May 19, 1951, 71 y.o.   MRN: [id number]      Progress Note from the Palliative Medicine Team at [facility]   Patient Name: MCKINZEE SPIRITO        Date: 10/03/2022 DOB: 1951-02-28  Age: 71 y.o. MRN#: [id number] Attending Physician: Charise Killian, MD Primary Care Physician: Rick Duff, MD Admit Date: 08/24/2022   Medical records reviewed   71 y.o. female  admitted on 08/24/2022 with  complex past medical history;  H.pylori gastritis (treated in 2020, follow up eradication testing negative), chronic abdominal pain,  chronic nausea,  colon polyps, pancreatic insufficiency, chronic IMA occlusion, duodenal ulcers, obesity    Admitted with weakness, post-prandial abdominal pain, N/V, loose stools.  AKI on CKD 4, refusing to eat/psych consulted, depression.    Patient/family  faces treatment option decisions, advanced directive decisions and anticipatory care needs.    Core track in place for nutritional support and tolerated at this time.  Now for consideration for G-Tube placement.  Patient is failing to thrive within the context of full medical support.   Large family gathered for continued discussion regarding next steps treatment plan.  Patient's daughter Susie Ehresman and her husband [name], two of  patient's sisters and  two nieces are present.  Education offered on current medical situation specific to patient's overall failure to thrive within the context of full medical support.  Education offered on the concept of human mortality and adult failure to thrive, and the limitations of medical interventions to prolong quality of life when the body fails to thrive.  Education offered on patient's long-term poor prognosis  Education offered on the difference between an aggressive medical intervention path and a palliative comfort path.  for this patient, at this time,  in this situation.  Education offered on the natural trajectory  and expectations at end-of-life.  Plan of care: -DNR/DNI -No artificial feeding or hydration now or in the future--DC core track -No further diagnostics, L fingersticks,  IV medication labs -Symptom management -[facility] is for residential hospice for EOL care, family mentions [facility]--prognosis is days to a week        -Family prepared that anything could happen at any time  Questions and concerns addressed     Total time spent on the unit was 75  minutes  PMT will continue to support holistically  Wadie Lessen NP  Palliative Medicine Team Team Phone # [phone] Pager [phone]

## 2022-10-03 NOTE — Progress Notes (Signed)
Nutrition Brief Note  Chart reviewed. Pt has transitioned to comfort care. Plan d/c to [facility]. No further nutrition interventions planned at this time.  Please re-consult as needed.   Gustavus Bryant, MS, RD, LDN Inpatient Clinical Dietitian Please see AMiON for contact information.

## 2022-10-03 NOTE — Progress Notes (Signed)
                 Interval history Sleeping soundly. Family at bedside. They report that she is spending more time asleep today. They wonder about her snoring.  Physical exam Blood pressure (!) 116/53, pulse 66, temperature 98 F (36.7 C), temperature source Oral, resp. rate 18, height '5\' 8"'$  (1.727 m), weight 132.5 kg, SpO2 95 %.  General: No apparent acute distress. Cardiovascular: Regular rate and rhythm. L radial pulse normal. Respiratory: Breathing regular and unlabored. Snoring respirations. Extremities: Anasarca. Neurologic: Alert to verbal but does not follow instructions.  Weight change:    Intake/Output Summary (Last 24 hours) at 10/03/2022 1329 Last data filed at 10/03/2022 0900 Gross per 24 hour  Intake 0 ml  Output 200 ml  Net -200 ml   Assessment and plan Hospital day 34  Alicia [name] is a 71 y.o. admitted for abdominal pain precluding ability to eat, thought to be due to pancreatic insufficiency and duodenal ulcers, whose condition has deteriorated during this hospitalization, now opting for symptomatic treatment and hospice.  Comfort care Is receiving just a fraction of her available MMEs but seems relatively sedated. No PRN lorazepam administered. Respirations are regular and unlabored, but sonorous. Suspect this is due to snoring due to airway positioning. Pulse is strong and regular. My impression is that she is comfortable. Hospice bed pending at this time. Questions from family answered.  Principal Problem:   Chronic generalized abdominal pain Active Problems:   Acute worsening of stage 4 chronic kidney disease (HCC)   Generalized weakness   Pressure injury of skin   Nausea and vomiting   Diarrhea   Duodenal ulcer   Mucosal abnormality of stomach   Mucosal abnormality of esophagus   Food aversion   Hypoalbuminemia due to protein-calorie malnutrition ([place])  Code: DNR Family Update: updated at bedside.  Discharge plan: hospice pending bed  availability.   Nani Gasser MD 10/03/2022, 1:29 PM  Pager: [phone] After 5pm on weekdays and 1pm on weekends: [phone]

## 2022-10-03 NOTE — Progress Notes (Signed)
[facility] ([facility]) Hospital Liaison Note  Referral received for patient/family interest in [facility]. Chart under review by [facility] physician.   Hospice eligibility pending.   Please call with any questions or concerns. Thank you   Shanita Wicker, LCSW [facility] Hospital Liaison  [phone] 

## 2022-10-03 NOTE — Progress Notes (Signed)
Patient ID: Alicia [name], female   DOB: 1951-03-31, 71 y.o.   MRN: [id number]      Progress Note from the Palliative Medicine Team at [facility]   Patient Name: Alicia [name]        Date: 10/03/2022 DOB: 05-28-1951  Age: 71 y.o. MRN#: [id number] Attending Physician: Charise Killian, MD Primary Care Physician: Rick Duff, MD Admit Date: 08/24/2022   Medical records reviewed   71 y.o. female  admitted on 08/24/2022 with  complex past medical history;  H.pylori gastritis (treated in 2020, follow up eradication testing negative), chronic abdominal pain,  chronic nausea,  colon polyps, pancreatic insufficiency, chronic IMA occlusion, duodenal ulcers, obesity    Admitted with weakness, post-prandial abdominal pain, N/V, loose stools.  AKI on CKD 4, refusing to eat/psych consulted, depression.  Core track in place for nutritional support and tolerated at this time.  Now for consideration for G-Tube placement.  Patient is failing to thrive within the context of full medical support.  Decision to shift to full comfort made by daughter/next of kin  with the support of her family 48 hours ago.  Today patient is minimally responsive, discussed with nursing utilization of as needed medicine to enhance comfort.  Several family members at the bedside.  Emotional support offered.  Education offered on the natural trajectory and expectations at end-of-life.  Questions and concerns addressed.  Plan of care: -DNR/DNI -No artificial feeding or hydration now or in the future--DC core track -No further diagnostics, L fingersticks,  IV medication labs -Symptom management -[facility] is for residential hospice for EOL care, family hopeful for  [facility].  Patient is unresponsive, no oral intake, worsening renal function/creatinine 4.25 when last checked 2 days ago, requiring intermittent as needed medications to ensure comfort.   Prognosis is days to a week        Questions and concerns  addressed     Total time spent on the unit was 50  minutes  PMT will continue to support holistically  Wadie Lessen NP  Palliative Medicine Team Team Phone # [phone] Pager [phone]

## 2022-10-04 ENCOUNTER — Encounter (HOSPITAL_COMMUNITY): Payer: Self-pay | Admitting: Internal Medicine

## 2022-10-04 DIAGNOSIS — R1084 Generalized abdominal pain: Secondary | ICD-10-CM | POA: Diagnosis not present

## 2022-10-04 DIAGNOSIS — G8929 Other chronic pain: Secondary | ICD-10-CM | POA: Diagnosis not present

## 2022-10-04 LAB — GLUCOSE, CAPILLARY: Glucose-Capillary: 88 mg/dL (ref 70–99)

## 2022-10-04 NOTE — Progress Notes (Signed)
Am care provided, oral care provided, pt. Moaning during care, Dilaudid given as per order with good effect  Anastasio Auerbach

## 2022-10-04 NOTE — TOC Progression Note (Addendum)
Transition of Care [facility]) - Initial/Assessment Note    Patient Details  Name: Alicia [name] MRN: [id number] Date of Birth: 1951/11/12  Transition of Care [facility]) CM/SW Contact:    Milinda Antis, LCSWA Phone Number: 10/04/2022, 12:43 PM  Clinical Narrative:                 LCSW contacted [facility] to inquire about bed availability and was informed that the patient is not appropriate for [facility].  LCSW contacted the patient's daughter to discuss other options.  There was no answer.  LCSW left VM requesting a returned call.   14:53-  LCSW met with the patient's daughter at bedside and presented Medicare.com rating list for home hospice and residential hospice facilities.  The daughter is speaking with family to decide home vs residential hospice.  TOC will follow up with daughter tomorrow final decision.      Expected Discharge Plan: Skilled Nursing Facility Barriers to Discharge: Insurance Authorization, SNF Pending bed offer, Continued Medical Work up   Patient Goals and CMS Choice Patient states their goals for this hospitalization and ongoing recovery are:: Pt wants to be able to return home. CMS Medicare.gov Compare Post Acute Care list provided to:: Patient Choice offered to / list presented to : Patient  Expected Discharge Plan and Services Expected Discharge Plan: [place] Acute Care Choice: [place] arrangements for the past 2 months: Single Family Home                                      Prior Living Arrangements/Services Living arrangements for the past 2 months: Single Family Home Lives with:: Self Patient language and need for interpreter reviewed:: Yes Do you feel safe going back to the place where you live?: Yes      Need for Family Participation in Patient Care: Yes (Comment) Care giver support system in place?: No (comment)   Criminal Activity/Legal Involvement Pertinent to Current  Situation/Hospitalization: No - Comment as needed  Activities of Daily Living Home Assistive Devices/Equipment: Walker (specify type) ADL Screening (condition at time of admission) Patient's cognitive ability adequate to safely complete daily activities?: Yes Is the patient deaf or have difficulty hearing?: No Does the patient have difficulty seeing, even when wearing glasses/contacts?: No Does the patient have difficulty concentrating, remembering, or making decisions?: No Patient able to express need for assistance with ADLs?: Yes Does the patient have difficulty dressing or bathing?: Yes Independently performs ADLs?: No Communication: Independent Dressing (OT): Needs assistance Is this a change from baseline?: Pre-admission baseline Grooming: Needs assistance Is this a change from baseline?: Pre-admission baseline Feeding: Independent Bathing: Needs assistance Is this a change from baseline?: Pre-admission baseline Toileting: Independent with device (comment) In/Out Bed: Independent with device (comment) Walks in Home: Independent with device (comment) Does the patient have difficulty walking or climbing stairs?: Yes Weakness of Legs: Both Weakness of Arms/Hands: None  Permission Sought/Granted Permission sought to share information with : Family Supports Permission granted to share information with : No              Emotional Assessment Appearance:: Appears stated age Attitude/Demeanor/Rapport: Guarded Affect (typically observed): Flat Orientation: : Oriented to Self, Oriented to Place, Oriented to  Time, Oriented to Situation Alcohol / Substance Use: Not Applicable Psych Involvement: No (comment)  Admission diagnosis:  Generalized weakness [R53.1] Patient Active  Problem List   Diagnosis Date Noted   Food aversion 09/09/2022   Hypoalbuminemia due to protein-calorie malnutrition ([place]) 09/09/2022   Duodenal ulcer 09/01/2022   Mucosal abnormality of stomach  09/01/2022   Mucosal abnormality of esophagus 09/01/2022   Chronic generalized abdominal pain 08/30/2022   Nausea and vomiting    Diarrhea    Pressure injury of skin 08/27/2022   Generalized weakness 08/24/2022   Acute exacerbation of congestive heart failure ([place]) 06/30/2022   Hypernatremia 06/30/2022   Hyperkalemia 06/17/2022   Normocytic anemia 06/17/2022   Intertrigo    Metabolic acidosis    Cellulitis 05/20/2022   Vitamin D deficiency 05/20/2022   Headache 02/16/2022   Disequilibrium 12/01/2021   (HFpEF) heart failure with preserved ejection fraction ([place]) 07/18/2021   Exacerbation of RAD (reactive airway disease) 02/27/2020   Acute worsening of stage 4 chronic kidney disease ([place]) 02/27/2020   Tobacco use 02/27/2020   Prolonged Q-T interval on ECG 04/27/2018   Diabetes mellitus type 2 in obese ([place]) 03/16/2018   Severe obesity (BMI >= 40) (HCC)    CVA (cerebral vascular accident) ([place]) 03/15/2018   Peripheral vascular disease, unspecified ([place]) 11/30/2012   Hyperlipidemia 07/20/2007   Primary hypertension 07/20/2007   PCP:  Rick Duff, MD Pharmacy:   [facility] ([facility]), [place] [address] [place]) [place] [address] Phone: [phone] Fax: [phone]     Social Determinants of Health (SDOH) Interventions    Readmission Risk Interventions    03/02/2020    1:08 PM  Readmission Risk Prevention Plan  Transportation Screening Complete  PCP or Specialist Appt within 5-7 Days Complete  Home Care Screening Complete  Medication Review (RN CM) Complete

## 2022-10-04 NOTE — Progress Notes (Signed)
Pt. Resting comfortably at present, oral mucosa dry, oral care provided, will cont. To monitor  Alicia [name]

## 2022-10-04 NOTE — Progress Notes (Signed)
Patient EN:[id number] Alicia [name]      DOB: 26-Nov-1950      IRW:[id number]      Palliative Medicine Team    Subjective: Bedside symptom check completed. Two family members bedside at time of visit. Reported to this RN that daughter is on way to hospital today from out of town.    Physical exam: Patient resting in bed with eyes closed at time of visit. Open mouthed breathing even and non-labored, with audible snoring sounds and partial tongue obstruction noted. Patient without physical or non-verbal signs of pain or discomfort at this time. Family endorses that the patient has remained very comfortable. Patient does not acknowledge this RN with introduction and verbal cue, did not attempt to further stimulate patient.    Assessment and plan: This RN spoke with [facility] liaison who is sending patient information to MD to accept or deny for [facility] residential bed. This RN explained to the family at bedside that patient may not qualify and that if that is the case, TOC will work with our team and the medial team to come up with a new plan for moving out of the hospital. This morning the patient remains stable for transport. Awaiting ACC decision on eligibility. Will continue to follow for any changes or advances.    Thank you for allowing the Palliative Medicine Team to assist in the care of this patient.     Damian Leavell, MSN, [place] Palliative Medicine Team Team Phone: [phone]  This phone is monitored 7a-7p, please reach out to attending physician outside of these hours for urgent needs.

## 2022-10-04 NOTE — Progress Notes (Signed)
                 Interval history No overnight events. Family at bedside. They report patient has been asleep all day without any period of wakefulness.  Physical exam Blood pressure (!) 119/47, pulse 72, temperature (!) 97 F (36.1 C), temperature source Axillary, resp. rate 16, height '5\' 8"'$  (1.727 m), weight 132.5 kg, SpO2 99 %.   General: No apparent acute distress or discomfort. Respiratory: Breathing regular and unlabored. Snoring respirations. Extremities: Anasarca. Neurologic: Unresponsive.  Assessment and plan Hospital day [place] is a 71 y.o. admitted for abdominal pain precluding ability to eat, thought to be due to pancreatic insufficiency and duodenal ulcers, whose condition has deteriorated during this hospitalization, now opting for symptomatic treatment and hospice.  Comfort care Continues to receive a fraction of the PRN hydromorphone and lorazepam ordered. Remains profoundly sedated despite this. No concerns from nursing regarding comfort measures. Wounds are well-managed. Respirations remain regular and unlabored. My impression is that she is comfortable. Unfortunately, [facility] cannot accept her as a patient at this time. Family is researching other options available in the area.  Principal Problem:   Chronic generalized abdominal pain Active Problems:   Acute worsening of stage 4 chronic kidney disease (HCC)   Generalized weakness   Pressure injury of skin   Nausea and vomiting   Diarrhea   Duodenal ulcer   Mucosal abnormality of stomach   Mucosal abnormality of esophagus   Food aversion   Hypoalbuminemia due to protein-calorie malnutrition [facility])  Discharge plan: hopeful for inpatient versus home hospice.  Nani Gasser MD 10/04/2022, 2:54 PM  Pager: [phone] After 5pm on weekdays and 1pm on weekends: [phone]

## 2022-10-04 NOTE — Plan of Care (Signed)
  Problem: Education: Goal: Ability to describe self-care measures that may prevent or decrease complications (Diabetes Survival Skills Education) will improve Outcome: Not Progressing Goal: Individualized Educational Video(s) Outcome: Not Progressing   Problem: Coping: Goal: Ability to adjust to condition or change in health will improve Outcome: Not Progressing   Problem: Fluid Volume: Goal: Ability to maintain a balanced intake and output will improve Outcome: Not Progressing   Problem: Health Behavior/Discharge Planning: Goal: Ability to identify and utilize available resources and services will improve Outcome: Not Progressing Goal: Ability to manage health-related needs will improve Outcome: Not Progressing   Problem: Metabolic: Goal: Ability to maintain appropriate glucose levels will improve Outcome: Not Progressing   Problem: Nutritional: Goal: Maintenance of adequate nutrition will improve Outcome: Not Progressing Goal: Progress toward achieving an optimal weight will improve Outcome: Not Progressing   Problem: Skin Integrity: Goal: Risk for impaired skin integrity will decrease Outcome: Not Progressing   Problem: Tissue Perfusion: Goal: Adequacy of tissue perfusion will improve Outcome: Not Progressing   Problem: Education: Goal: Knowledge of General Education information will improve Description: Including pain rating scale, medication(s)/side effects and non-pharmacologic comfort measures Outcome: Not Progressing   Problem: Health Behavior/Discharge Planning: Goal: Ability to manage health-related needs will improve Outcome: Not Progressing   Problem: Clinical Measurements: Goal: Ability to maintain clinical measurements within normal limits will improve Outcome: Not Progressing Goal: Will remain free from infection Outcome: Not Progressing Goal: Diagnostic test results will improve Outcome: Not Progressing Goal: Respiratory complications will  improve Outcome: Not Progressing Goal: Cardiovascular complication will be avoided Outcome: Not Progressing   Problem: Activity: Goal: Risk for activity intolerance will decrease Outcome: Not Progressing   Problem: Nutrition: Goal: Adequate nutrition will be maintained Outcome: Not Progressing   Problem: Coping: Goal: Level of anxiety will decrease Outcome: Not Progressing   Problem: Elimination: Goal: Will not experience complications related to bowel motility Outcome: Not Progressing Goal: Will not experience complications related to urinary retention Outcome: Not Progressing   Problem: Pain Managment: Goal: General experience of comfort will improve Outcome: Not Progressing   Problem: Safety: Goal: Ability to remain free from injury will improve Outcome: Not Progressing   Problem: Skin Integrity: Goal: Risk for impaired skin integrity will decrease Outcome: Not Progressing   Problem: Nutrition Goal: Patient maintains adequate hydration Outcome: Not Progressing Goal: Patient maintains weight Outcome: Not Progressing Goal: Patient/Family demonstrates understanding of diet Outcome: Not Progressing Goal: Patient/Family independently completes tube feeding Outcome: Not Progressing Goal: Patient will have no more than 5 lb weight change during LOS Outcome: Not Progressing Goal: Patient will utilize adaptive techniques to administer nutrition Outcome: Not Progressing Goal: Patient will verbalize dietary restrictions Outcome: Not Progressing  Goals not met, pt. Comfort care/end of life

## 2022-10-05 DIAGNOSIS — G8929 Other chronic pain: Secondary | ICD-10-CM | POA: Diagnosis not present

## 2022-10-05 DIAGNOSIS — R1084 Generalized abdominal pain: Secondary | ICD-10-CM | POA: Diagnosis not present

## 2022-10-05 NOTE — TOC Progression Note (Signed)
Transition of Care [facility]) - Initial/Assessment Note    Patient Details  Name: Alicia [name] MRN: [id number] Date of Birth: 04/14/1951  Transition of Care [facility]) CM/SW Contact:    Milinda Antis, [place] Phone Number: 10/05/2022, 1:32 PM  Clinical Narrative:                 LCSW spoke with the patient's daughter to receive facility choice for inpatient hospice.  The daughter choose [facility] [place] in [place].  LCSW placed referral and is awaiting a determination.    Expected Discharge Plan: Skilled Nursing Facility Barriers to Discharge: Insurance Authorization, SNF Pending bed offer, Continued Medical Work up   Patient Goals and CMS Choice Patient states their goals for this hospitalization and ongoing recovery are:: Pt wants to be able to return home. CMS Medicare.gov Compare Post Acute Care list provided to:: Patient Choice offered to / list presented to : Patient  Expected Discharge Plan and Services Expected Discharge Plan: [place] Acute Care Choice: [place] arrangements for the past 2 months: Single Family Home                                      Prior Living Arrangements/Services Living arrangements for the past 2 months: Single Family Home Lives with:: Self Patient language and need for interpreter reviewed:: Yes Do you feel safe going back to the place where you live?: Yes      Need for Family Participation in Patient Care: Yes (Comment) Care giver support system in place?: No (comment)   Criminal Activity/Legal Involvement Pertinent to Current Situation/Hospitalization: No - Comment as needed  Activities of Daily Living Home Assistive Devices/Equipment: Walker (specify type) ADL Screening (condition at time of admission) Patient's cognitive ability adequate to safely complete daily activities?: Yes Is the patient deaf or have difficulty hearing?: No Does the patient have difficulty  seeing, even when wearing glasses/contacts?: No Does the patient have difficulty concentrating, remembering, or making decisions?: No Patient able to express need for assistance with ADLs?: Yes Does the patient have difficulty dressing or bathing?: Yes Independently performs ADLs?: No Communication: Independent Dressing (OT): Needs assistance Is this a change from baseline?: Pre-admission baseline Grooming: Needs assistance Is this a change from baseline?: Pre-admission baseline Feeding: Independent Bathing: Needs assistance Is this a change from baseline?: Pre-admission baseline Toileting: Independent with device (comment) In/Out Bed: Independent with device (comment) Walks in Home: Independent with device (comment) Does the patient have difficulty walking or climbing stairs?: Yes Weakness of Legs: Both Weakness of Arms/Hands: None  Permission Sought/Granted Permission sought to share information with : Family Supports Permission granted to share information with : No              Emotional Assessment Appearance:: Appears stated age Attitude/Demeanor/Rapport: Guarded Affect (typically observed): Flat Orientation: : Oriented to Self, Oriented to Place, Oriented to  Time, Oriented to Situation Alcohol / Substance Use: Not Applicable Psych Involvement: No (comment)  Admission diagnosis:  Generalized weakness [R53.1] Patient Active Problem List   Diagnosis Date Noted   Food aversion 09/09/2022   Hypoalbuminemia due to protein-calorie malnutrition ([place]) 09/09/2022   Duodenal ulcer 09/01/2022   Mucosal abnormality of stomach 09/01/2022   Mucosal abnormality of esophagus 09/01/2022   Chronic generalized abdominal pain 08/30/2022   Nausea and vomiting    Diarrhea    Pressure  injury of skin 08/27/2022   Generalized weakness 08/24/2022   Acute exacerbation of congestive heart failure ([place]) 06/30/2022   Hypernatremia 06/30/2022   Hyperkalemia 06/17/2022   Normocytic anemia  06/17/2022   Intertrigo    Metabolic acidosis    Cellulitis 05/20/2022   Vitamin D deficiency 05/20/2022   Headache 02/16/2022   Disequilibrium 12/01/2021   (HFpEF) heart failure with preserved ejection fraction ([place]) 07/18/2021   Exacerbation of RAD (reactive airway disease) 02/27/2020   Acute worsening of stage 4 chronic kidney disease ([place]) 02/27/2020   Tobacco use 02/27/2020   Prolonged Q-T interval on ECG 04/27/2018   Diabetes mellitus type 2 in obese ([place]) 03/16/2018   Severe obesity (BMI >= 40) (HCC)    CVA (cerebral vascular accident) ([place]) 03/15/2018   Peripheral vascular disease, unspecified ([place]) 11/30/2012   Hyperlipidemia 07/20/2007   Primary hypertension 07/20/2007   PCP:  Rick Duff, MD Pharmacy:   [facility] ([facility]), [place] - [address] [place] ([place]) [place] [address] Phone: [phone] Fax: [phone]     Social Determinants of Health (SDOH) Interventions    Readmission Risk Interventions    03/02/2020    1:08 PM  Readmission Risk Prevention Plan  Transportation Screening Complete  PCP or Specialist Appt within 5-7 Days Complete  Home Care Screening Complete  Medication Review (RN CM) Complete

## 2022-10-05 NOTE — Consult Note (Signed)
[facility]: Referral received for [facility]. Unfortunately, we do not have any beds to offer today. Will work up referral and send to hospice [employer] [facility] for review/approval for when bed is available. Please call Sharalyn Ink, RN with any questions. [phone]

## 2022-10-05 NOTE — Progress Notes (Signed)
                  Subjective:   Summary: Alicia [name] is a 71 y.o. year old female currently admitted on the IMTS HD#37 for diffuse abdominal pain as well as severely decreased PO intake likely multifactorial in etiology due to pancreatic insufficiency, chronic IMA occlusion, duodenal ulcers.  Overnight Events:  -NAEON. - 1 dose Dilaudid administered yesterday for pain.  Appears to be comfortable during time of our assessment.   Objective:     Physical Exam:  General: Appears comfortable, sleeping   Assessment/Plan:   Patient Summary: Alicia [name] is a 71 y.o. with a pertinent PMH of HFpEF, HTN, DMII, CKD4, PAD, prior CVA, who presented with progressive weakness and post-prandial abdominal pain and admitted for decreased functional ability, diffuse abdominal pain, and chronic diarrhea likely multifactorial in etiology due to pancreatic insufficiency, duodenal ulcers.  She has transition to comfort care and is pending disposition.  Comfort Care Received 1 dose of Dilaudid yesterday, none today so far.  Family currently looking at other possibilities for disposition including [facility] [place] [facility] [place].  #Abdominal Pain of Unkown Etiology #Chronic diarrhea #IMA occlusion #Duodenal ulcers #Pancreatic insufficiency #Lack of PO Intake  #Severe food aversion #C. Diff infection #AKI on CKD4 #Hyponatremia #Hyperkalemia #HFpEF #Normocytic anemia  Code: DNR Dispo: comfort care, pending  Linus Galas MD PGY-1 Internal Medicine Resident Pager: [phone] Please contact the on call pager after 5 pm and on weekends at [phone].

## 2022-10-06 DIAGNOSIS — G8929 Other chronic pain: Secondary | ICD-10-CM | POA: Diagnosis not present

## 2022-10-06 DIAGNOSIS — R1084 Generalized abdominal pain: Secondary | ICD-10-CM | POA: Diagnosis not present

## 2022-10-06 NOTE — TOC Transition Note (Signed)
Transition of Care [facility]) - CM/SW Discharge Note   Patient Details  Name: Alicia [name] MRN: [id number] Date of Birth: [birth year]  Transition of Care [facility]) CM/SW Contact:  Milinda Antis, [place] Phone Number: 10/06/2022, 12:28 PM   Clinical Narrative:    Patient will DC to: [facility]) Anticipated DC date: 10/06/2022 Family notified: Yes (Patient's daughter) Transport by: Corey Harold   Per MD patient ready for DC to SNF. RN to call report prior to discharge [phone] ). RN, patient, patient's family, and facility notified of DC. DC packet on chart. Ambulance transport requested for patient.   CSW will sign off for now as social work intervention is no longer needed. Please consult [place] again if new needs arise.    Final next level of care: [place] Barriers to Discharge: Barriers Resolved   Patient Goals and CMS Choice Patient states their goals for this hospitalization and ongoing recovery are:: Pt wants to be able to return home. CMS Medicare.gov Compare Post Acute Care list provided to:: Patient Choice offered to / list presented to : Patient  Discharge Placement              Patient [facility] bed at:  ([facility] [place]) Patient to be transferred to facility by: [place] Name of family member notified: JOLETTE, LANA (Daughter) [phone] Patient and family notified of of transfer: 10/06/22  Discharge Plan and Services     Post Acute Care Choice: [place]                               Social Determinants of Health (SDOH) Interventions     Readmission Risk Interventions    03/02/2020    1:08 PM  Readmission Risk Prevention Plan  Transportation Screening Complete  PCP or Specialist Appt within 5-7 Days Complete  Home Care Screening Complete  Medication Review (RN CM) Complete

## 2022-10-06 NOTE — Progress Notes (Signed)
DISCHARGE NOTE HOME Arta [place] discharged to  hospice  per MD order.    An After Visit Summary (AVS) was printed and given to transport. Neomia Glass, RN

## 2022-10-06 NOTE — Progress Notes (Addendum)
   This pt was referred to our hospice facility for end of life care. I have introduced the daughter, Ms. Spanbauer to comfort care approach which has already been started in hospital. She is in agreement and does want to proceed with referral process of having pt transferred to the [facility] in [facility]. I have reviewed the pt with our MD and she has been approved for services. The daughter has accepted bed offer. She will be available to meet with me at hospital around 230pm today to complete the paperwork. We have ordered a bariatric bed for her to be comfortable in at our facility.   We will proceed with d/c plan and MD is in agreement with pt to d/c.   CM to set ambulance up with [facility] at 400pm.  The number for the nurse to call report is [place] RN [phone]

## 2022-10-06 NOTE — Discharge Summary (Signed)
Name: Alicia [name] MRN: [id number] DOB: [birth year] 71 y.o. PCP: Rick Duff, MD  Date of Admission: 08/24/2022  1:29 PM Date of Discharge:  10/06/2022 Attending Physician: Dr.  Saverio Danker  DISCHARGE DIAGNOSIS:  Primary Problem: Chronic generalized abdominal pain   [name] Problems: Principal Problem:   Chronic generalized abdominal pain Active Problems:   Acute worsening of stage 4 chronic kidney disease (HCC)   Generalized weakness   Pressure injury of skin   Nausea and vomiting   Diarrhea   Duodenal ulcer   Mucosal abnormality of stomach   Mucosal abnormality of esophagus   Food aversion   Hypoalbuminemia due to protein-calorie malnutrition (HCC)    DISCHARGE MEDICATIONS:   Allergies as of 10/06/2022       Reactions   Nifedipine Er Other (See Comments)   Caused nose bleeds in higher doses (90 mg., namely)   Latex Rash   Lipitor [atorvastatin] Swelling, Other (See Comments)   Lip swelling to lipitor Patient tolerates crestor        Medication List     STOP taking these medications    Acetaminophen Extra Strength 500 MG Tabs   albuterol 108 (90 Base) MCG/ACT inhaler Commonly known as: VENTOLIN HFA   amLODipine 10 MG tablet Commonly known as: NORVASC   aspirin EC 81 MG tablet   diclofenac Sodium 1 % Gel Commonly known as: VOLTAREN   famotidine 20 MG tablet Commonly known as: PEPCID   furosemide 40 MG tablet Commonly known as: Lasix   metoprolol succinate 100 MG 24 hr tablet Commonly known as: TOPROL-XL   OXYGEN   rosuvastatin 10 MG tablet Commonly known as: CRESTOR   sitaGLIPtin 25 MG tablet Commonly known as: Januvia   VITAMIN D3 PO        DISPOSITION AND FOLLOW-UP:  AliciaAlicia [name] was discharged from Alicia [name] in Serious condition to inpatient hospice.  [name] COURSE:  Patient Summary: Abdominal pain  Chronic diarrhea Severe food aversion CKD stage 4 [name] presented with general  malaise and weakness. She also had poor appetite and severe diarrhea with abdominal pain. At admission she was noted to have worsening of her anemia of chronic disease. She was depressed, with PHQ9 score of 14. Several tests and studies were obtained to assess for causes of her symptoms, including a mesenteric duplex study that was negative for stenosis; upper endoscopy showing duodenitis with non-bleeding duodenal ulcers with clean ulcer base, negative H pylori studies, initial negative c. Diff studies, MRA with completely occluded IMA but no indication for vascular intervention, decreased fecal elastase, abdominal ultrasound, repeat c. Diff studies which were positive. Due to prolonged anorexia she had a CorTrak placed for artificial nutrition and she was never able to consume significant nutrition PO. Several medical therapies were used in attempt to control symptoms without success including initiation of psychiatric medications. Unfortunately despite artificial nutrition via CorTrak, her nutrition status continued to decline with worsening of anasarca, renal function, and overall clinical presentation. She transitioned from CKD stage 4 to stage 5 throughout the course of admission and due to anorexia and clearly stated wishes early in admission to not start hemodialysis, she was deemed to not be a candidate for hemodialysis. After maximizing her medical therapy without improvement of her symptoms and continued decline, several meetings with Alicia [name] and her family led to the decision to transition to hospice care to allow for peaceful passing.   DISCHARGE INSTRUCTIONS:   Discharge Instructions     No wound  care   Complete by: As directed        SUBJECTIVE:  Alicia [name] is resting peacefully in bed.  Discharge Vitals:   BP (!) 101/52 (BP Location: Left Arm)   Pulse 77   Temp (!) 96 F (35.6 C) (Axillary)   Resp 15   Ht '5\' 8"'$  (1.727 m)   Wt 132.5 kg   SpO2 100%   BMI 44.40 kg/m    OBJECTIVE:  Physical exam deferred for maximal patient comfort.  Pertinent Labs, Studies, and Procedures:     Latest Ref Rng & Units 10/01/2022    6:16 AM 09/30/2022    4:38 AM 09/29/2022    6:28 AM  CBC  WBC 4.0 - 10.5 K/uL 10.5  9.8  10.1   Hemoglobin 12.0 - 15.0 g/dL 8.7  7.8  8.3   Hematocrit 36.0 - 46.0 % 25.8  23.1  23.3   Platelets 150 - 400 K/uL 208  296  289        Latest Ref Rng & Units 10/01/2022    6:16 AM 09/30/2022    4:38 AM 09/29/2022    6:28 AM  CMP  Glucose 70 - 99 mg/dL 134  192  170   BUN 8 - 23 mg/dL 96  91  87   Creatinine 0.44 - 1.00 mg/dL 4.25  4.37  4.38   Sodium 135 - 145 mmol/L 123  126  128   Potassium 3.5 - 5.1 mmol/L 5.0  4.5  4.0   Chloride 98 - 111 mmol/L 90  95  97   CO2 22 - 32 mmol/L '13  16  16   '$ Calcium 8.9 - 10.3 mg/dL 7.7  7.7  7.8   Total Protein 6.5 - 8.1 g/dL 4.3  4.4  4.5   Total Bilirubin 0.3 - 1.2 mg/dL 0.4  0.5  0.5   Alkaline Phos 38 - 126 U/L 444  369  348   AST 15 - 41 U/L 68  34  34   ALT 0 - 44 U/L 58  43  47     VAS [place] MESENTERIC  Result Date: 08/25/2022 ABDOMINAL VISCERAL [name]  Date of Exam:   08/25/2022 Medical Rec #: [id number]             Accession #:    [id number] Date of Birth: [birth year]             Patient Gender: F Patient Age:   87 years Exam Location:  [name] Procedure:      VAS [place] MESENTERIC Referring Phys: [id number] Velna Ochs -------------------------------------------------------------------------------- Indications: Insufficiency mesenteric High Risk Factors: Hypertension, hyperlipidemia, Diabetes. Limitations: Air/bowel gas, obesity and patient discomfort. Comparison Study: no prior Performing Technologist: Archie Patten RVS Supporting Technologist: Rogelia Rohrer RVT, RDMS  Examination Guidelines: A complete evaluation includes B-mode imaging, spectral Doppler, color Doppler, and power Doppler as needed of all accessible portions of each vessel. Bilateral testing is  considered an integral part of a complete examination. Limited examinations for reoccurring indications may be performed as noted.  Duplex Findings: +--------------------+--------+--------+------+--------+ Mesenteric          PSV cm/sEDV cm/sPlaqueComments +--------------------+--------+--------+------+--------+ Celiac Artery Origin  126                          +--------------------+--------+--------+------+--------+ SMA Origin            166      16                  +--------------------+--------+--------+------+--------+  SMA Proximal          219      19                  +--------------------+--------+--------+------+--------+ SMA Mid               162      17                  +--------------------+--------+--------+------+--------+ SMA Distal            120      12                  +--------------------+--------+--------+------+--------+ CHA                   194      24                  +--------------------+--------+--------+------+--------+ Splenic                90      20                  +--------------------+--------+--------+------+--------+ IMA                    87                          +--------------------+--------+--------+------+--------+    Summary: Mesenteric: Normal Celiac artery , Superior Mesenteric artery, Inferior Mesenteric artery, Splenic artery and Hepatic artery findings.  *See table(s) above for measurements and observations.  Diagnosing physician: Jamelle Haring  Electronically signed by Jamelle Haring on 08/25/2022 at 4:46:44 PM.    Final    CT ABDOMEN PELVIS WO CONTRAST  Result Date: 08/24/2022 CLINICAL DATA:  Left abdominal pain EXAM: CT ABDOMEN AND PELVIS WITHOUT CONTRAST TECHNIQUE: Multidetector CT imaging of the abdomen and pelvis was performed following the standard protocol without IV contrast. RADIATION DOSE REDUCTION: This exam was performed according to the departmental dose-optimization program which includes  automated exposure control, adjustment of the mA and/or kV according to patient size and/or use of iterative reconstruction technique. COMPARISON:  05/30/2022 FINDINGS: Lower chest: Mild bibasilar atelectasis. Hepatobiliary: Unenhanced liver is unremarkable. Distended gallbladder. No cholelithiasis. No intrahepatic or extrahepatic duct dilatation. Pancreas: Within normal limits. Spleen: Within normal limits. Adrenals/Urinary Tract: Adrenal glands are within normal limits. Kidneys are within normal limits.  No hydronephrosis. Bladder is underdistended but unremarkable. Stomach/Bowel: Stomach is within normal limits. Suspected duodenal diverticulum adjacent to the pancreatic head (series 3/image 32), poorly visualized. No evidence of bowel obstruction. Normal appendix (series 3/image 9). Scattered left colonic diverticulosis, without evidence of diverticulitis. Vascular/Lymphatic: No evidence of abdominal aortic aneurysm. Atherosclerotic calcifications of the abdominal aorta and branch vessels. No suspicious abdominopelvic lymphadenopathy. Reproductive: Uterus is grossly unremarkable. Suspected prominent nabothian cyst. Bilateral ovaries are within normal limits. Other: No abdominopelvic ascites. Moderate fat containing periumbilical hernia (series 3/image 67) which now also contains fluid (series 3/image 71). Musculoskeletal: Degenerative changes of the visualized thoracolumbar spine. IMPRESSION: Left colonic diverticulosis, without evidence of diverticulitis. No evidence of bowel obstruction. Normal appendix. Moderate periumbilical hernia with fat/fluid. Additional stable ancillary findings as above. Electronically Signed   By: Julian Hy M.D.   On: 08/24/2022 22:02   DG Chest Port 1 View  Result Date: 08/24/2022 CLINICAL DATA:  Shortness of breath EXAM: PORTABLE CHEST 1 VIEW COMPARISON:  06/30/2022 FINDINGS: Transverse diameter of heart is increased. There  are no signs of pulmonary edema or new focal  infiltrates. There is no pleural effusion or pneumothorax. Degenerative changes are noted in both shoulders, more severe on the right side. IMPRESSION: There are no signs of pulmonary edema or focal pulmonary consolidation. Electronically Signed   By: Elmer Picker M.D.   On: 08/24/2022 14:37     Signed: Farrel Gordon, D.O.  Internal Medicine Resident, PGY-2 [facility]  Pager: [phone]

## 2022-10-07 NOTE — Patient Outreach (Signed)
Alicia [name] 1951/02/23 [id number]  Follow up:  Active with [facility] RNCM  Notified [facility]ed to [facility].  Will sign off.  Natividad Brood, RN BSN [place]  [phone] business mobile phone Toll free office [phone]  *[place]  [phone] Fax number: [phone] [email or web address]

## 2022-10-11 ENCOUNTER — Ambulatory Visit: Payer: Self-pay

## 2022-10-12 NOTE — Patient Outreach (Signed)
  Care Coordination   Collaboration  Visit Note   10/12/2022 Name: Alicia [name] MRN: [id number] DOB: [birth year]  Alicia [name] is a 71 y.o. year old female who sees Rick Duff, MD for primary care. I  Collaborated with Natividad Brood RN  What matters to the patients health and wellness today?  RNCM collaborated with the hospital liaison, and she informed me that the patient was transferred to [place].  I will be closing the patient's care plan.    Goals Addressed               This Visit's Progress     COMPLETED: Monitor My CHF (pt-stated)        Care Coordination Interventions: Provided education on low sodium diet Reviewed Heart Failure Action Plan in depth  Advised patient to weigh each morning after emptying bladder Reviewed role of diuretics in prevention of fluid overload and management of heart failure; Discussed the importance of keeping all appointments with provider Active listening / Reflection utilized  Emotional Support Provided Closing the care plan        SDOH assessments and interventions completed:  No     Care Coordination Interventions Activated:  Yes  Care Coordination Interventions:  Yes, provided   Follow up plan: No further intervention required.   Encounter Outcome:  Pt. Visit Completed   Lazaro Arms RN, BSN, [facility]   Phone: [phone]

## 2022-10-24 NOTE — Addendum Note (Signed)
Addended by: Truddie Crumble on: 10/24/2022 02:42 PM   Modules accepted: Orders

## 2022-11-15 ENCOUNTER — Telehealth: Payer: Self-pay | Admitting: *Deleted

## 2022-11-15 NOTE — Patient Outreach (Signed)
  Care Coordination   11/15/2022 Name: Alicia [name] MRN: [id number] DOB: 1951-06-07   Care Coordination Outreach Attempts:  An unsuccessful telephone outreach was attempted today to offer the patient information about available care coordination services as a benefit of their health plan.   Follow Up Plan:  Additional outreach attempts will be made to offer the patient care coordination information and services.   Encounter Outcome:  No Answer   Care Coordination Interventions:  No, not indicated    Raina Mina, RN Care Management Coordinator [place] Office [phone]
